# Patient Record
Sex: Female | Born: 1960 | Race: White | Hispanic: No | Smoking: Former smoker
Health system: Southern US, Community
[De-identification: ages and names within clinical notes are randomized; demographics above are authoritative.]

## PROBLEM LIST (undated history)

## (undated) DIAGNOSIS — F419 Anxiety disorder, unspecified: Secondary | ICD-10-CM

## (undated) DIAGNOSIS — C7800 Secondary malignant neoplasm of unspecified lung: Secondary | ICD-10-CM

## (undated) DIAGNOSIS — S82892A Other fracture of left lower leg, initial encounter for closed fracture: Secondary | ICD-10-CM

## (undated) DIAGNOSIS — M419 Scoliosis, unspecified: Secondary | ICD-10-CM

## (undated) DIAGNOSIS — Z923 Personal history of irradiation: Secondary | ICD-10-CM

## (undated) DIAGNOSIS — E669 Obesity, unspecified: Secondary | ICD-10-CM

## (undated) DIAGNOSIS — A6000 Herpesviral infection of urogenital system, unspecified: Secondary | ICD-10-CM

## (undated) DIAGNOSIS — F329 Major depressive disorder, single episode, unspecified: Secondary | ICD-10-CM

## (undated) DIAGNOSIS — T7840XA Allergy, unspecified, initial encounter: Secondary | ICD-10-CM

## (undated) DIAGNOSIS — M810 Age-related osteoporosis without current pathological fracture: Secondary | ICD-10-CM

## (undated) DIAGNOSIS — C801 Malignant (primary) neoplasm, unspecified: Secondary | ICD-10-CM

## (undated) DIAGNOSIS — S82891A Other fracture of right lower leg, initial encounter for closed fracture: Secondary | ICD-10-CM

## (undated) DIAGNOSIS — N393 Stress incontinence (female) (male): Secondary | ICD-10-CM

## (undated) DIAGNOSIS — C7951 Secondary malignant neoplasm of bone: Secondary | ICD-10-CM

## (undated) DIAGNOSIS — Z9221 Personal history of antineoplastic chemotherapy: Secondary | ICD-10-CM

## (undated) DIAGNOSIS — E7800 Pure hypercholesterolemia, unspecified: Secondary | ICD-10-CM

## (undated) DIAGNOSIS — M1990 Unspecified osteoarthritis, unspecified site: Secondary | ICD-10-CM

## (undated) DIAGNOSIS — K219 Gastro-esophageal reflux disease without esophagitis: Secondary | ICD-10-CM

## (undated) DIAGNOSIS — M797 Fibromyalgia: Secondary | ICD-10-CM

## (undated) DIAGNOSIS — C787 Secondary malignant neoplasm of liver and intrahepatic bile duct: Secondary | ICD-10-CM

## (undated) DIAGNOSIS — Z1389 Encounter for screening for other disorder: Secondary | ICD-10-CM

## (undated) DIAGNOSIS — G629 Polyneuropathy, unspecified: Secondary | ICD-10-CM

## (undated) DIAGNOSIS — G709 Myoneural disorder, unspecified: Secondary | ICD-10-CM

## (undated) DIAGNOSIS — C50919 Malignant neoplasm of unspecified site of unspecified female breast: Secondary | ICD-10-CM

## (undated) DIAGNOSIS — S62101A Fracture of unspecified carpal bone, right wrist, initial encounter for closed fracture: Secondary | ICD-10-CM

## (undated) HISTORY — DX: Polyneuropathy, unspecified: G62.9

## (undated) HISTORY — DX: Other fracture of left lower leg, initial encounter for closed fracture: S82.892A

## (undated) HISTORY — PX: WISDOM TOOTH EXTRACTION: SHX21

## (undated) HISTORY — DX: Pure hypercholesterolemia, unspecified: E78.00

## (undated) HISTORY — DX: Major depressive disorder, single episode, unspecified: F32.9

## (undated) HISTORY — DX: Fracture of unspecified carpal bone, right wrist, initial encounter for closed fracture: S62.101A

## (undated) HISTORY — DX: Allergy, unspecified, initial encounter: T78.40XA

## (undated) HISTORY — DX: Other fracture of right lower leg, initial encounter for closed fracture: S82.891A

## (undated) HISTORY — DX: Stress incontinence (female) (male): N39.3

## (undated) HISTORY — DX: Fibromyalgia: M79.7

## (undated) HISTORY — DX: Obesity, unspecified: E66.9

## (undated) HISTORY — DX: Herpesviral infection of urogenital system, unspecified: A60.00

## (undated) HISTORY — DX: Unspecified osteoarthritis, unspecified site: M19.90

## (undated) HISTORY — DX: Scoliosis, unspecified: M41.9

## (undated) MED FILL — Tbo-Filgrastim Soln Prefilled Syringe 300 MCG/0.5ML: SUBCUTANEOUS | Qty: 0.5 | Status: AC

---

## 1998-10-19 ENCOUNTER — Ambulatory Visit (HOSPITAL_COMMUNITY): Admission: RE | Admit: 1998-10-19 | Discharge: 1998-10-19 | Payer: Self-pay | Admitting: Obstetrics and Gynecology

## 1999-08-23 ENCOUNTER — Other Ambulatory Visit: Admission: RE | Admit: 1999-08-23 | Discharge: 1999-08-23 | Payer: Self-pay | Admitting: Obstetrics and Gynecology

## 2000-02-01 ENCOUNTER — Ambulatory Visit (HOSPITAL_COMMUNITY): Admission: RE | Admit: 2000-02-01 | Discharge: 2000-02-01 | Payer: Self-pay | Admitting: Obstetrics and Gynecology

## 2000-02-01 ENCOUNTER — Encounter: Payer: Self-pay | Admitting: Obstetrics and Gynecology

## 2000-08-23 ENCOUNTER — Other Ambulatory Visit: Admission: RE | Admit: 2000-08-23 | Discharge: 2000-08-23 | Payer: Self-pay | Admitting: *Deleted

## 2001-04-25 ENCOUNTER — Ambulatory Visit (HOSPITAL_COMMUNITY): Admission: RE | Admit: 2001-04-25 | Discharge: 2001-04-25 | Payer: Self-pay | Admitting: Obstetrics and Gynecology

## 2001-04-25 ENCOUNTER — Encounter: Payer: Self-pay | Admitting: Obstetrics and Gynecology

## 2002-05-05 ENCOUNTER — Emergency Department (HOSPITAL_COMMUNITY): Admission: EM | Admit: 2002-05-05 | Discharge: 2002-05-05 | Payer: Self-pay

## 2002-05-05 ENCOUNTER — Encounter: Payer: Self-pay | Admitting: Emergency Medicine

## 2002-07-02 ENCOUNTER — Other Ambulatory Visit: Admission: RE | Admit: 2002-07-02 | Discharge: 2002-07-02 | Payer: Self-pay | Admitting: Obstetrics and Gynecology

## 2003-12-05 DIAGNOSIS — A6000 Herpesviral infection of urogenital system, unspecified: Secondary | ICD-10-CM

## 2003-12-05 HISTORY — DX: Herpesviral infection of urogenital system, unspecified: A60.00

## 2005-03-06 ENCOUNTER — Other Ambulatory Visit: Admission: RE | Admit: 2005-03-06 | Discharge: 2005-03-06 | Payer: Self-pay | Admitting: Obstetrics and Gynecology

## 2005-07-11 ENCOUNTER — Ambulatory Visit (HOSPITAL_COMMUNITY): Admission: RE | Admit: 2005-07-11 | Discharge: 2005-07-11 | Payer: Self-pay | Admitting: Family Medicine

## 2005-07-19 ENCOUNTER — Encounter: Admission: RE | Admit: 2005-07-19 | Discharge: 2005-07-19 | Payer: Self-pay | Admitting: Family Medicine

## 2006-01-15 ENCOUNTER — Encounter: Admission: RE | Admit: 2006-01-15 | Discharge: 2006-01-15 | Payer: Self-pay | Admitting: Family Medicine

## 2006-04-04 ENCOUNTER — Encounter: Payer: Self-pay | Admitting: Obstetrics and Gynecology

## 2006-09-20 ENCOUNTER — Encounter: Admission: RE | Admit: 2006-09-20 | Discharge: 2006-09-20 | Payer: Self-pay | Admitting: Family Medicine

## 2007-09-23 ENCOUNTER — Encounter: Admission: RE | Admit: 2007-09-23 | Discharge: 2007-09-23 | Payer: Self-pay | Admitting: Family Medicine

## 2007-10-15 ENCOUNTER — Other Ambulatory Visit: Admission: RE | Admit: 2007-10-15 | Discharge: 2007-10-15 | Payer: Self-pay | Admitting: Family Medicine

## 2008-08-18 ENCOUNTER — Encounter: Admission: RE | Admit: 2008-08-18 | Discharge: 2008-08-18 | Payer: Self-pay | Admitting: Family Medicine

## 2008-09-25 ENCOUNTER — Encounter: Admission: RE | Admit: 2008-09-25 | Discharge: 2008-09-25 | Payer: Self-pay | Admitting: Family Medicine

## 2009-09-27 ENCOUNTER — Encounter: Admission: RE | Admit: 2009-09-27 | Discharge: 2009-09-27 | Payer: Self-pay | Admitting: Family Medicine

## 2010-01-25 ENCOUNTER — Encounter: Admission: RE | Admit: 2010-01-25 | Discharge: 2010-01-25 | Payer: Self-pay | Admitting: Family Medicine

## 2010-09-29 ENCOUNTER — Encounter: Admission: RE | Admit: 2010-09-29 | Discharge: 2010-09-29 | Payer: Self-pay | Admitting: Physician Assistant

## 2010-12-04 DIAGNOSIS — Z1389 Encounter for screening for other disorder: Secondary | ICD-10-CM

## 2010-12-04 HISTORY — DX: Encounter for screening for other disorder: Z13.89

## 2011-10-10 ENCOUNTER — Other Ambulatory Visit: Payer: Self-pay | Admitting: Physician Assistant

## 2011-10-10 DIAGNOSIS — Z1231 Encounter for screening mammogram for malignant neoplasm of breast: Secondary | ICD-10-CM

## 2011-11-03 ENCOUNTER — Ambulatory Visit: Payer: Self-pay

## 2011-12-05 DIAGNOSIS — F32A Depression, unspecified: Secondary | ICD-10-CM

## 2011-12-05 DIAGNOSIS — K219 Gastro-esophageal reflux disease without esophagitis: Secondary | ICD-10-CM

## 2011-12-05 DIAGNOSIS — F419 Anxiety disorder, unspecified: Secondary | ICD-10-CM

## 2011-12-05 DIAGNOSIS — M797 Fibromyalgia: Secondary | ICD-10-CM

## 2011-12-05 HISTORY — DX: Anxiety disorder, unspecified: F41.9

## 2011-12-05 HISTORY — DX: Depression, unspecified: F32.A

## 2011-12-05 HISTORY — DX: Gastro-esophageal reflux disease without esophagitis: K21.9

## 2011-12-05 HISTORY — DX: Fibromyalgia: M79.7

## 2011-12-13 ENCOUNTER — Ambulatory Visit (INDEPENDENT_AMBULATORY_CARE_PROVIDER_SITE_OTHER): Payer: Managed Care, Other (non HMO) | Admitting: Physician Assistant

## 2011-12-13 DIAGNOSIS — F341 Dysthymic disorder: Secondary | ICD-10-CM

## 2011-12-13 DIAGNOSIS — R634 Abnormal weight loss: Secondary | ICD-10-CM

## 2012-01-04 ENCOUNTER — Ambulatory Visit: Payer: Managed Care, Other (non HMO)

## 2012-01-04 ENCOUNTER — Ambulatory Visit (INDEPENDENT_AMBULATORY_CARE_PROVIDER_SITE_OTHER): Payer: Managed Care, Other (non HMO) | Admitting: Emergency Medicine

## 2012-01-04 DIAGNOSIS — R05 Cough: Secondary | ICD-10-CM

## 2012-01-04 DIAGNOSIS — F329 Major depressive disorder, single episode, unspecified: Secondary | ICD-10-CM | POA: Insufficient documentation

## 2012-01-04 DIAGNOSIS — R509 Fever, unspecified: Secondary | ICD-10-CM

## 2012-01-04 DIAGNOSIS — R059 Cough, unspecified: Secondary | ICD-10-CM

## 2012-01-04 DIAGNOSIS — F32A Depression, unspecified: Secondary | ICD-10-CM | POA: Insufficient documentation

## 2012-01-04 DIAGNOSIS — N951 Menopausal and female climacteric states: Secondary | ICD-10-CM | POA: Insufficient documentation

## 2012-01-04 DIAGNOSIS — M419 Scoliosis, unspecified: Secondary | ICD-10-CM

## 2012-01-04 DIAGNOSIS — F3289 Other specified depressive episodes: Secondary | ICD-10-CM

## 2012-01-04 DIAGNOSIS — R519 Headache, unspecified: Secondary | ICD-10-CM | POA: Insufficient documentation

## 2012-01-04 HISTORY — DX: Scoliosis, unspecified: M41.9

## 2012-01-04 LAB — POCT CBC
HCT, POC: 44.4 % (ref 37.7–47.9)
Hemoglobin: 14.3 g/dL (ref 12.2–16.2)
Lymph, poc: 1.4 (ref 0.6–3.4)
MID (cbc): 0.2 (ref 0–0.9)
POC Granulocyte: 3.3 (ref 2–6.9)
POC MID %: 4.8 %M (ref 0–12)

## 2012-01-04 MED ORDER — OSELTAMIVIR PHOSPHATE 75 MG PO CAPS: 75.0000 mg | ORAL_CAPSULE | Freq: Two times a day (BID) | ORAL | Status: AC

## 2012-01-04 NOTE — Progress Notes (Signed)
  Subjective:    Patient ID: April [name], female    DOB: 1961-10-04, 51 y.o.   MRN: [id number]  HPI  Patient is a 51 year old female who comes in complaining of flue like symptoms, sore throat, cold, cough, and severe myalgias.  She has also been to the doctor this morning for an evaluation of a previous root canal, and was placed on amoxicillin.     Review of Systems  Denies fever or productive cough     Objective:   Physical Exam  Constitutional:       Appears weak and fatigued  Eyes: Pupils are equal, round, and reactive to light.  Neck: No thyromegaly present.  Cardiovascular: Normal rate, normal heart sounds and intact distal pulses.   Pulmonary/Chest: No respiratory distress. She has no wheezes. She has no rales. She exhibits no tenderness.  Abdominal: She exhibits no distension.          Assessment & Plan:  Flu like symptoms. Will check flu swab cbc and cxr

## 2012-01-04 NOTE — Progress Notes (Signed)
  Subjective:    Patient ID: April [name], female    DOB: May 07, 1961, 51 y.o.   MRN: [id number]  HPI    Review of Systems     Objective:   Physical Exam        Assessment & Plan:  [facility] reading (PRIMARY) by  Dr. Cleta Alberts.NAD scoliosis

## 2012-01-17 ENCOUNTER — Ambulatory Visit (INDEPENDENT_AMBULATORY_CARE_PROVIDER_SITE_OTHER): Payer: Managed Care, Other (non HMO) | Admitting: Emergency Medicine

## 2012-01-17 VITALS — BP 119/76 | HR 69 | Temp 98.6°F | Resp 16 | Ht 63.75 in | Wt 216.0 lb

## 2012-01-17 DIAGNOSIS — IMO0001 Reserved for inherently not codable concepts without codable children: Secondary | ICD-10-CM

## 2012-01-17 DIAGNOSIS — R5383 Other fatigue: Secondary | ICD-10-CM

## 2012-01-17 DIAGNOSIS — R5381 Other malaise: Secondary | ICD-10-CM

## 2012-01-17 DIAGNOSIS — R110 Nausea: Secondary | ICD-10-CM

## 2012-01-17 LAB — POCT CBC
Granulocyte percent: 64.5 %G (ref 37–80)
Hemoglobin: 12.5 g/dL (ref 12.2–16.2)
MCH, POC: 30.6 pg (ref 27–31.2)
MID (cbc): 0.4 (ref 0–0.9)
POC Granulocyte: 3.3 (ref 2–6.9)
POC LYMPH PERCENT: 28.6 %L (ref 10–50)
POC MID %: 6.9 %M (ref 0–12)
RBC: 4.09 M/uL (ref 4.04–5.48)

## 2012-01-17 LAB — GLUCOSE, POCT (MANUAL RESULT ENTRY): POC Glucose: 81

## 2012-01-17 MED ORDER — RANITIDINE HCL 150 MG PO TABS: 150.0000 mg | ORAL_TABLET | Freq: Two times a day (BID) | ORAL | Status: DC

## 2012-01-17 NOTE — Patient Instructions (Signed)
While we wait on the remaining lab results, try to get rest and drink plenty of fluids.  I've prescribed a medication to help your reflux, Ranitidine.  Over time, treatment of your reflux should help your fatigue.  If all your tests are normal, we'll consider increasing your Celexa dose, as stress and depression can cause/worsen these symptoms as well.

## 2012-01-17 NOTE — Progress Notes (Signed)
  Subjective:    Patient ID: April [name], female    DOB: 12-25-1960, 51 y.o.   MRN: [id number]  HPI This patient presents with persistent fatigue myalgias and nausea. She was seen 2 weeks ago with similar symptoms. At that time it was thought she may have had influenza despite her negative test. She had just had a root canal and so completed her course of amoxicillin, no improvement. She describes headache neck pain and hurting all over. No energy. No constipation. No diarrhea. No known or hematochezia. No urinary symptoms. Nausea is routine for her, "I have refluxed every day." Medications-she started the Celexa approximately 8 weeks ago.  She has long-standing depression. Most recently on Effexor. Switched to Celexa. Her daughter has very difficult to control bipolar disorder. Recently having crisis.   Review of Systems As above. No sore throat. Mild cough. No nasal congestion or drainage. No rash.    Objective:   Physical Exam Vital signs are noted. This is a well-developed well-nourished, obese white female who is awake, alert and oriented in no acute distress. Nasal passages are clear. Ear canals are clear and TMs are normal bilaterally. Oropharynx is remarkable for some mild erythema on the left.  Neck is supple and nontender without thyromegaly or lymphadenopathy. Heart and lungs are clear. Peripheral pulses are symmetrically strong and skin is warm and dry.  Labs from her last visit are reviewed. Rapid strep was negative. CBC was normal. Chest x-ray also normal.  Results for orders placed in visit on 01/17/12  POCT CBC      Component Value Range   WBC 5.1  4.6 - 10.2 (K/uL)   Lymph, poc 1.5  0.6 - 3.4    POC LYMPH PERCENT 28.6  10 - 50 (%L)   MID (cbc) 0.4  0 - 0.9    POC MID % 6.9  0 - 12 (%M)   POC Granulocyte 3.3  2 - 6.9    Granulocyte percent 64.5  37 - 80 (%G)   RBC 4.09  4.04 - 5.48 (M/uL)   Hemoglobin 12.5  12.2 - 16.2 (g/dL)   HCT, POC 40.9  81.1 - 47.9 (%)   MCV  94.1  80 - 97 (fL)   MCH, POC 30.6  27 - 31.2 (pg)   MCHC 32.5  31.8 - 35.4 (g/dL)   RDW, POC 91.4     Platelet Count, POC 273  142 - 424 (K/uL)   MPV 9.0  0 - 99.8 (fL)  GLUCOSE, POCT (MANUAL RESULT ENTRY)      Component Value Range   POC Glucose 81    POCT SEDIMENTATION RATE      Component Value Range   POCT SED RATE 26 (*) 0 - 22 (mm/hr)   CMET, TSH and EBV titers pending.     Assessment & Plan:  1. Fatigue. 2. Nausea.  Await remaining lab results.  I suspect her symptoms are related to resolving a recent viral infection, possibly influenza, acute situational stress secondary to her daughter's mental health issues at present that are ongoing, and inadequately treated reflux. Ranitidine 150 mg twice a day.  If lab results still outstanding all return normal I recommend increasing her Celexa dose. She has a scheduled appointment with her primary care provider, Benny Lennert, PA-C, in 3 weeks.  Discussed with Dr. Cleta Alberts and Ms. Weber.

## 2012-01-19 ENCOUNTER — Telehealth: Payer: Self-pay

## 2012-01-19 LAB — COMPREHENSIVE METABOLIC PANEL
AST: 18 U/L (ref 0–37)
Albumin: 3.7 g/dL (ref 3.5–5.2)
BUN: 11 mg/dL (ref 6–23)
CO2: 25 mEq/L (ref 19–32)
Calcium: 8.6 mg/dL (ref 8.4–10.5)
Chloride: 106 mEq/L (ref 96–112)
Glucose, Bld: 91 mg/dL (ref 70–99)
Sodium: 138 mEq/L (ref 135–145)

## 2012-01-19 LAB — EPSTEIN-BARR VIRUS VCA ANTIBODY PANEL: EBV EA IgG: 0.04 {ISR}

## 2012-01-19 LAB — TSH: TSH: 2.288 u[IU]/mL (ref 0.350–4.500)

## 2012-01-19 NOTE — Telephone Encounter (Signed)
Pt states that she is considerably worse and she can barely move, feels as if she has the flu. Please Advise.

## 2012-01-20 NOTE — Telephone Encounter (Signed)
Dr. Cleta Alberts,  See phone note. Would you like to increase Celexa as your note says, or RTC?  RD

## 2012-01-20 NOTE — Telephone Encounter (Signed)
LMOM to CB. 

## 2012-01-20 NOTE — Telephone Encounter (Signed)
Spoke with pt she is having severe aches and pains, stomcah pain, chest and upper back still hurting. She doesn't think these are sxs of depression. She would like to know what to do next. Please advise

## 2012-01-21 NOTE — Telephone Encounter (Signed)
Spoke with pt and advised to come in for recheck. Pt states she will come in to see Benny Lennert on Monday.

## 2012-01-21 NOTE — Telephone Encounter (Signed)
Please call patient and tell her the symptoms she is describing are not related to depression. I would advise she return to clinic for reevaluation.

## 2012-01-21 NOTE — Telephone Encounter (Signed)
LMOM to CB. 

## 2012-01-22 ENCOUNTER — Ambulatory Visit (INDEPENDENT_AMBULATORY_CARE_PROVIDER_SITE_OTHER): Payer: Managed Care, Other (non HMO) | Admitting: Family Medicine

## 2012-01-22 DIAGNOSIS — R635 Abnormal weight gain: Secondary | ICD-10-CM

## 2012-01-22 DIAGNOSIS — R52 Pain, unspecified: Secondary | ICD-10-CM

## 2012-01-22 DIAGNOSIS — R5381 Other malaise: Secondary | ICD-10-CM

## 2012-01-22 DIAGNOSIS — IMO0001 Reserved for inherently not codable concepts without codable children: Secondary | ICD-10-CM

## 2012-01-22 LAB — POCT URINALYSIS DIPSTICK
Blood, UA: NEGATIVE
Glucose, UA: NEGATIVE
Nitrite, UA: NEGATIVE
pH, UA: 6.5

## 2012-01-22 LAB — POCT UA - MICROSCOPIC ONLY
Epithelial cells, urine per micros: NEGATIVE
WBC, Ur, HPF, POC: NEGATIVE

## 2012-01-22 LAB — HEPATITIS B SURFACE ANTIBODY, QUANTITATIVE: Hepatitis B-Post: 0.2 m[IU]/mL

## 2012-01-22 LAB — RPR

## 2012-01-22 NOTE — Progress Notes (Signed)
Patient Name: April [name] Date of Birth: 1961/07/30 Medical Record Number: [id number] Gender: female Date of Encounter: 01/22/2012  History of Present Illness:  April [name] is a 51 y.o. very pleasant female patient who presents with the following:  I've had all sort of labs done and I'm still feeling the same way.  I hurt all over, I can barely walk.  Now my stomach is hurting."  "Ilm totally exhausted."   Stomach feels "like it's burning" despite being on Tagament 2x/ daily.  Sometimes hurts "right through to my back." Was here originally for this problem on 01/04/12, then returned on 01/19/12.  She notes that flu- like symptoms have not gone away, "I can't stand for very long at all without just hurting all over.  Like washing dishes, you can just forget it.  It's hard to hold on to something for a period of time (such as a coffee cup)."   Has noticed that he feel have been a little swollen, cannot be sure if her joints have swollen. No Fhx of any autoimmune problem.    Actually has had problems with "hurting" off an on for years.  Wonders if she has FBM.   No fever.  Sx are worse when she first rises- "my whole body hurts"  "It hurts to sleep."  Does have some element of plantar fascitis but everything else is worst in the am also.  Also notes that her hips feel numb when she sleeps.    Her weight had been around 150lbs through most of her adult life- went up to around 200 a year ago.    Has changed from Effexor to celexa about 6 weeks ago.  This has helped with nausea.  Feels that her mood is ok- "I'm not depressed."  Normal sleep habits.    LMP was 05/2011.  Perimenopausal.    Recent OV for fatigue- A and P that visit: 1. Fatigue.  2. Nausea.  Await remaining lab results. I suspect her symptoms are related to resolving a recent viral infection, possibly influenza, acute situational stress secondary to her daughter's mental health issues at present that are ongoing, and inadequately  treated reflux.  Ranitidine 150 mg twice a day.  If lab results still outstanding all return normal I recommend increasing her Celexa dose. She has a scheduled appointment with her primary care provider, Benny Lennert, PA-C, in 3 weeks.  Patient Active Problem List  Diagnoses  . Perimenopausal  . Depression (emotion)   Past Medical History  Diagnosis Date  . Obesity   . Migraine    History reviewed. No pertinent past surgical history. History  Substance Use Topics  . Smoking status: Former Smoker    Quit date: 01/21/1994  . Smokeless tobacco: Never Used  . Alcohol Use: No   Family History  Problem Relation Age of Onset  . Alcohol abuse Mother   . Arthritis Mother   . Hypertension Mother   . Heart disease Mother    Allergies  Allergen Reactions  . Hydrocodone Nausea Only and Other (See Comments)    dizziness    Medication list has been reviewed and updated.  Review of Systems: As per HPI  Physical Examination: Filed Vitals:   01/22/12 0925  BP: 132/85  Pulse: 69  Temp: 99.2 F (37.3 C)  TempSrc: Oral  Resp: 16  Height: 5' 3.75" (1.619 m)  Weight: 217 lb 12.8 oz (98.793 kg)    Body mass index is 37.68 kg/(m^2).  GEN: WDWN,  NAD, Non-toxic, A & O x 3, obese HEENT: Atraumatic, Normocephalic. Neck supple. No masses, No LAD. Ears and Nose: No external deformity. CV: RRR, No M/G/R. No JVD. No thrill. No extra heart sounds. PULM: CTA B, no wheezes, crackles, rhonchi. No retractions. No resp. distress. No accessory muscle use. ABD: S, NT, ND, +BS. No rebound. No HSM. EXTR: No c/c/e.  No gross joint abnormalities such as nodules, swelling or heat NEURO Normal gait.  PSYCH: Normally interactive. Conversant. Not depressed or anxious appearing.  Calm demeanor.   Results for orders placed in visit on 01/22/12  POCT UA - MICROSCOPIC ONLY      Component Value Range   WBC, Ur, HPF, POC neg     RBC, urine, microscopic neg     Bacteria, U Microscopic neg     Mucus, UA  neg     Epithelial cells, urine per micros neg     Crystals, Ur, HPF, POC neg     Casts, Ur, LPF, POC neg     Yeast, UA neg    POCT URINALYSIS DIPSTICK      Component Value Range   Color, UA yellow     Clarity, UA clear     Glucose, UA neg     Bilirubin, UA neg     Ketones, UA neg     Spec Grav, UA 1.015     Blood, UA neg     pH, UA 6.5     Protein, UA neg     Urobilinogen, UA 0.2     Nitrite, UA neg     Leukocytes, UA Negative    POCT URINE PREGNANCY      Component Value Range   Preg Test, Ur Negative     Assessment and Plan: 1. Malaise and fatigue  HIV Antibody, RPR, Hepatitis C antibody, Hepatitis B surface antigen, Hepatitis B surface antibody, POCT UA - Microscopic Only, POCT urinalysis dipstick, Anti-DNA antibody, double-stranded, ANA  2. Body aches    3. Weight gain  POCT urine pregnancy   Will do labs as above in attempt to find reason for pts symptoms- consider auto- immune etiology.  Let us know if she gets worse- otherwise plan follow- up with labs

## 2012-01-23 ENCOUNTER — Encounter: Payer: Self-pay | Admitting: *Deleted

## 2012-01-23 LAB — ANA: Anti Nuclear Antibody(ANA): NEGATIVE

## 2012-01-24 ENCOUNTER — Encounter: Payer: Self-pay | Admitting: Family Medicine

## 2012-01-26 ENCOUNTER — Telehealth: Payer: Self-pay | Admitting: Family Medicine

## 2012-01-28 ENCOUNTER — Telehealth: Payer: Self-pay | Admitting: Family Medicine

## 2012-01-28 NOTE — Telephone Encounter (Signed)
Opened in error

## 2012-01-29 ENCOUNTER — Telehealth: Payer: Self-pay

## 2012-01-29 NOTE — Telephone Encounter (Signed)
Pt CB to get lab results. Asked Dr Patsy Lager who stated labs looked OK but she will call pt back later to explain in more detail. Let pt know this and she said best number to CB is work number which is pt's personal number [phone]

## 2012-01-29 NOTE — Telephone Encounter (Signed)
Called to discuss with her.  She continues to have pain all over, but even more so in her lower back at this point.  Went over all labs.  She would like to proceed with a referral to possibly diagnose FBM and rule- out other causes of prolonged pain. I will do this for me and set up an evaluation with Dr. Corliss Skains

## 2012-01-29 NOTE — Telephone Encounter (Signed)
Nothing to enter

## 2012-02-06 ENCOUNTER — Other Ambulatory Visit: Payer: Self-pay | Admitting: Physician Assistant

## 2012-02-07 ENCOUNTER — Encounter: Payer: Self-pay | Admitting: Physician Assistant

## 2012-02-07 ENCOUNTER — Ambulatory Visit (INDEPENDENT_AMBULATORY_CARE_PROVIDER_SITE_OTHER): Payer: Managed Care, Other (non HMO) | Admitting: Physician Assistant

## 2012-02-07 VITALS — BP 119/84 | HR 57 | Temp 97.0°F | Resp 20 | Ht 64.0 in | Wt 214.6 lb

## 2012-02-07 DIAGNOSIS — IMO0001 Reserved for inherently not codable concepts without codable children: Secondary | ICD-10-CM

## 2012-02-07 DIAGNOSIS — M791 Myalgia, unspecified site: Secondary | ICD-10-CM

## 2012-02-07 DIAGNOSIS — F419 Anxiety disorder, unspecified: Secondary | ICD-10-CM

## 2012-02-07 DIAGNOSIS — F411 Generalized anxiety disorder: Secondary | ICD-10-CM

## 2012-02-07 MED ORDER — CYCLOBENZAPRINE HCL 5 MG PO TABS: 5.0000 mg | ORAL_TABLET | Freq: Three times a day (TID) | ORAL | Status: DC | PRN

## 2012-02-07 MED ORDER — CITALOPRAM HYDROBROMIDE 20 MG PO TABS: 20.0000 mg | ORAL_TABLET | Freq: Every day | ORAL | Status: DC

## 2012-02-07 NOTE — Progress Notes (Signed)
  Subjective:    Patient ID: April [name], female    DOB: 03-24-1961, 51 y.o.   MRN: [id number]  HPI  Pt presents to clinic for f/u of Celexa and anxiety.  Her nausea has completely resolved after d/c of Effexor.  The Celexa seems to be helping with depression/anxiety.  She has an appt with Dr. Corliss Skains on 4/4 for possible fibromyalgia dx.  She has constant pain in muscles of arms, legs and back.  Pain is worse at times than others - causes her insomnia because she aches.  Has burning pain in muscles that ache during the day.  Still trying to lose weight but is frustrated because hurts to bad to work out.  Review of Systems  Musculoskeletal: Positive for myalgias. Negative for arthralgias.  Psychiatric/Behavioral: Positive for sleep disturbance. The patient is nervous/anxious.        Objective:   Physical Exam  Constitutional: She is oriented to person, place, and time. She appears well-developed and well-nourished.  HENT:  Head: Normocephalic and atraumatic.  Right Ear: External ear normal.  Left Ear: External ear normal.  Eyes: Conjunctivae are normal.  Pulmonary/Chest: Effort normal.  Neurological: She is alert and oriented to person, place, and time.  Skin: Skin is warm and dry.  Psychiatric: She has a normal mood and affect. Her behavior is normal. Judgment and thought content normal.          Assessment & Plan:   1. Anxiety   2. Myalgia    Will continue with celexa 20mg  qd.  Will trial Flexeril 5mg  1-2 tid for muscle pain and aching.  Expect pt to get relief if she can tolerate meds and ? Need for neurontin for burning pain but will wait for rheumatologist eval.  D/w pt to start with Flexeril at night 5mg  and titrate to improve symptoms or side effect intolerability.  Pt understands and agrees.

## 2012-03-15 ENCOUNTER — Encounter: Payer: Self-pay | Admitting: Family Medicine

## 2012-03-15 DIAGNOSIS — M797 Fibromyalgia: Secondary | ICD-10-CM | POA: Insufficient documentation

## 2012-03-28 ENCOUNTER — Other Ambulatory Visit: Payer: Self-pay | Admitting: Physician Assistant

## 2012-07-02 ENCOUNTER — Ambulatory Visit (INDEPENDENT_AMBULATORY_CARE_PROVIDER_SITE_OTHER): Payer: Managed Care, Other (non HMO) | Admitting: Family Medicine

## 2012-07-02 VITALS — BP 126/76 | HR 84 | Temp 97.8°F | Resp 16 | Ht 64.0 in | Wt 217.0 lb

## 2012-07-02 DIAGNOSIS — B373 Candidiasis of vulva and vagina: Secondary | ICD-10-CM

## 2012-07-02 DIAGNOSIS — N898 Other specified noninflammatory disorders of vagina: Secondary | ICD-10-CM

## 2012-07-02 DIAGNOSIS — Z113 Encounter for screening for infections with a predominantly sexual mode of transmission: Secondary | ICD-10-CM

## 2012-07-02 DIAGNOSIS — B3731 Acute candidiasis of vulva and vagina: Secondary | ICD-10-CM

## 2012-07-02 LAB — POCT URINALYSIS DIPSTICK
Bilirubin, UA: NEGATIVE
Blood, UA: NEGATIVE
Glucose, UA: NEGATIVE
Ketones, UA: NEGATIVE
Leukocytes, UA: NEGATIVE
Nitrite, UA: NEGATIVE
Protein, UA: NEGATIVE
Spec Grav, UA: 1.02
Urobilinogen, UA: 0.2
pH, UA: 7

## 2012-07-02 LAB — POCT UA - MICROSCOPIC ONLY
Bacteria, U Microscopic: NEGATIVE
Casts, Ur, LPF, POC: NEGATIVE
Crystals, Ur, HPF, POC: NEGATIVE
Yeast, UA: NEGATIVE

## 2012-07-02 LAB — POCT WET PREP WITH KOH
KOH Prep POC: POSITIVE
Trichomonas, UA: NEGATIVE
Yeast Wet Prep HPF POC: NEGATIVE

## 2012-07-02 MED ORDER — FLUCONAZOLE 150 MG PO TABS: 150.0000 mg | ORAL_TABLET | Freq: Once | ORAL | Status: AC

## 2012-07-02 NOTE — Progress Notes (Signed)
 [facility]:  Office Visit  Chief Complaint:  Chief Complaint  Patient presents with  . Exposure to STD    pt believes she has chlamydia    HPI: April [name] is a 51 y.o. female who complains of unprotected sex on June 15, 2012. Vaginal DC. Denies fevers, chills,+ pelvic pain. Tried yeast treatment without relief.   Past Medical History  Diagnosis Date  . Obesity   . Migraine   . Fibromyalgia 2013    diagnosed by Dr. Corliss Skains   No past surgical history on file. History   Social History  . Marital Status: Single    Spouse Name: N/A    Number of Children: N/A  . Years of Education: N/A   Social History Main Topics  . Smoking status: Former Smoker    Quit date: 01/21/1994  . Smokeless tobacco: Never Used  . Alcohol Use: No  . Drug Use: No  . Sexually Active: None   Other Topics Concern  . None   Social History Narrative  . None   Family History  Problem Relation Age of Onset  . Alcohol abuse Mother   . Arthritis Mother   . Hypertension Mother   . Heart disease Mother    Allergies  Allergen Reactions  . Hydrocodone Nausea Only and Other (See Comments)    dizziness   Prior to Admission medications   Medication Sig Start Date End Date Taking? Authorizing Provider  Cetirizine HCl (ZYRTEC PO) Take 10 mg by mouth.    Yes Historical Provider, MD  DULoxetine (CYMBALTA) 60 MG capsule Take 60 mg by mouth daily.   Yes Historical Provider, MD  magnesium gluconate (MAGONATE) 500 MG tablet Take 500 mg by mouth 2 (two) times daily.   Yes Historical Provider, MD  Multiple Vitamin (MULTIVITAMIN) capsule Take 1 capsule by mouth daily.   Yes Historical Provider, MD  citalopram (CELEXA) 20 MG tablet Take 1 tablet (20 mg total) by mouth daily. 02/07/12   Morrell Riddle, PA-C  ranitidine (ZANTAC) 150 MG tablet Take 1 tablet (150 mg total) by mouth 2 (two) times daily. 01/17/12 01/16/13  Chelle S Jeffery, PA-C     ROS: The patient denies fevers, chills, night  sweats, unintentional weight loss, chest pain, palpitations, wheezing, dyspnea on exertion, nausea, vomiting, abdominal pain, dysuria, hematuria, melena, numbness, weakness, or tingling.   All other systems have been reviewed and were otherwise negative with the exception of those mentioned in the HPI and as above.    PHYSICAL EXAM: Filed Vitals:   07/02/12 1613  BP: 126/76  Pulse: 84  Temp: 97.8 F (36.6 C)  Resp: 16   Filed Vitals:   07/02/12 1613  Height: 5\' 4"  (1.626 m)  Weight: 217 lb (98.431 kg)   Body mass index is 37.25 kg/(m^2).  General: Alert, no acute distress HEENT:  Normocephalic, atraumatic, oropharynx patent.  Cardiovascular:  Regular rate and rhythm, no rubs murmurs or gallops.  No Carotid bruits, radial pulse intact. No pedal edema.  Respiratory: Clear to auscultation bilaterally.  No wheezes, rales, or rhonchi.  No cyanosis, no use of accessory musculature GI: No organomegaly, abdomen is soft and non-tender, positive bowel sounds.  No masses. Skin: No rashes. Neurologic: Facial musculature symmetric. Psychiatric: Patient is appropriate throughout our interaction. Lymphatic: No cervical lymphadenopathy Musculoskeletal: Gait intact. GU-no lesions/masses, minimal white dc, no CMT   LABS: Results for orders placed in visit on 07/02/12  POCT URINALYSIS DIPSTICK  Component Value Range   Color, UA yellow     Clarity, UA clear     Glucose, UA negative     Bilirubin, UA negative     Ketones, UA negative     Spec Grav, UA 1.020     Blood, UA negative     pH, UA 7.0     Protein, UA negative     Urobilinogen, UA 0.2     Nitrite, UA negative     Leukocytes, UA Negative    POCT UA - MICROSCOPIC ONLY      Component Value Range   WBC, Ur, HPF, POC 0-2     RBC, urine, microscopic 0-1     Bacteria, U Microscopic negative     Mucus, UA trace     Epithelial cells, urine per micros 0-1     Crystals, Ur, HPF, POC negative     Casts, Ur, LPF, POC negative      Yeast, UA negative    POCT WET PREP WITH KOH      Component Value Range   Trichomonas, UA Negative     Clue Cells Wet Prep HPF POC 1-2     Epithelial Wet Prep HPF POC 1-6     Yeast Wet Prep HPF POC negative     Bacteria Wet Prep HPF POC 2+     RBC Wet Prep HPF POC 0-1     WBC Wet Prep HPF POC 2-5     KOH Prep POC Positive       EKG/XRAY:   Primary read interpreted by Dr. Conley Rolls at [facility].   ASSESSMENT/PLAN: Encounter Diagnoses  Name Primary?  . Screening for STD (sexually transmitted disease) Yes  . Vaginal Discharge    Cadidiasis-Rx Diflucan STD labs pending    ,  PHUONG, DO 07/02/2012 5:47 PM

## 2012-07-03 LAB — HSV(HERPES SIMPLEX VRS) I + II AB-IGG
HSV 1 Glycoprotein G Ab, IgG: <0.1 IV
HSV 2 Glycoprotein G Ab, IgG: 15.46 IV — ABNORMAL HIGH

## 2012-07-03 LAB — RPR

## 2012-07-03 LAB — HIV ANTIBODY (ROUTINE TESTING W REFLEX): HIV: NONREACTIVE

## 2012-07-03 LAB — GC/CHLAMYDIA PROBE AMP, URINE
Chlamydia, Swab/Urine, PCR: NEGATIVE
GC Probe Amp, Urine: NEGATIVE

## 2012-07-04 ENCOUNTER — Telehealth: Payer: Self-pay | Admitting: Family Medicine

## 2012-07-04 NOTE — Telephone Encounter (Signed)
D/w pt STD results

## 2012-07-28 ENCOUNTER — Ambulatory Visit (INDEPENDENT_AMBULATORY_CARE_PROVIDER_SITE_OTHER): Payer: Managed Care, Other (non HMO) | Admitting: Emergency Medicine

## 2012-07-28 VITALS — BP 144/89 | HR 76 | Temp 98.1°F | Resp 16 | Ht 63.75 in | Wt 218.4 lb

## 2012-07-28 DIAGNOSIS — J309 Allergic rhinitis, unspecified: Secondary | ICD-10-CM

## 2012-07-28 DIAGNOSIS — J3089 Other allergic rhinitis: Secondary | ICD-10-CM | POA: Insufficient documentation

## 2012-07-28 DIAGNOSIS — T7840XA Allergy, unspecified, initial encounter: Secondary | ICD-10-CM

## 2012-07-28 DIAGNOSIS — J029 Acute pharyngitis, unspecified: Secondary | ICD-10-CM

## 2012-07-28 HISTORY — DX: Allergy, unspecified, initial encounter: T78.40XA

## 2012-07-28 LAB — POCT RAPID STREP A (OFFICE): Rapid Strep A Screen: NEGATIVE

## 2012-07-28 MED ORDER — MOMETASONE FUROATE 50 MCG/ACT NA SUSP: 2.0000 | Freq: Every day | NASAL | Status: DC

## 2012-07-28 NOTE — Progress Notes (Signed)
   Date:  07/28/2012   Name:  April [name]   DOB:  [birth year]   MRN:  [id number] Gender: female Age: 51 y.o.  PCP:  Benny Lennert, PA-C    Chief Complaint: Sore Throat and Cough   History of Present Illness:  April [name] is a 51 y.o. pleasant patient who presents with the following:  History of severe allergies with SAR.  Has chronic nasal congestion and post nasal drainage and clear nasal discharge.  Headache for past two weeks mostly frontal in nature.  No cough, fever or chills.  Today has sore throat with "pus in tonsils".  Says she has ulcers on the back of her throat.   Patient Active Problem List  Diagnosis  . Perimenopausal  . Depression (emotion)  . Anxiety  . Fibromyalgia    Past Medical History  Diagnosis Date  . Obesity   . Migraine   . Fibromyalgia 2013    diagnosed by Dr. Corliss Skains    No past surgical history on file.  History  Substance Use Topics  . Smoking status: Former Smoker    Quit date: 01/21/1994  . Smokeless tobacco: Never Used  . Alcohol Use: No    Family History  Problem Relation Age of Onset  . Alcohol abuse Mother   . Arthritis Mother   . Hypertension Mother   . Heart disease Mother     Allergies  Allergen Reactions  . Hydrocodone Nausea Only and Other (See Comments)    dizziness    Medication list has been reviewed and updated.  Current Outpatient Prescriptions on File Prior to Visit  Medication Sig Dispense Refill  . Cetirizine HCl (ZYRTEC PO) Take 10 mg by mouth.       . citalopram (CELEXA) 20 MG tablet Take 1 tablet (20 mg total) by mouth daily.  30 tablet  5  . DULoxetine (CYMBALTA) 60 MG capsule Take 60 mg by mouth daily.      . magnesium gluconate (MAGONATE) 500 MG tablet Take 500 mg by mouth 2 (two) times daily.      . Multiple Vitamin (MULTIVITAMIN) capsule Take 1 capsule by mouth daily.      . ranitidine (ZANTAC) 150 MG tablet Take 1 tablet (150 mg total) by mouth 2 (two) times daily.  60 tablet  1    Review  of Systems:  As per HPI, otherwise negative.    Physical Examination: Filed Vitals:   07/28/12 1035  BP: 144/89  Pulse: 76  Temp: 98.1 F (36.7 C)  Resp: 16   Filed Vitals:   07/28/12 1035  Height: 5' 3.75" (1.619 m)  Weight: 218 lb 6.4 oz (99.066 kg)   Body mass index is 37.78 kg/(m^2). Ideal Body Weight: Weight in (lb) to have BMI = 25: 144.2    GEN: WDWN, NAD, Non-toxic, Alert & Oriented x 3 HEENT: Atraumatic, Normocephalic.   Oropharynx red and injected.  Aphthous ulcers posterior pharynx.   Ears and Nose: No external deformity.  Nasal congestion and erythema with clear nasal drainage EXTR: No clubbing/cyanosis/edema NEURO: Normal gait.  PSYCH: Normally interactive. Conversant. Not depressed or anxious appearing.  Calm demeanor.  CHEST:  CTA BS=  Assessment and Plan: Aphthous stomatitis Seasonal allergic rhinitis Allegra Pharyngitis   Carmelina Dane, MD   Results for orders placed in visit on 07/28/12  POCT RAPID STREP A (OFFICE)      Component Value Range   Rapid Strep A Screen Negative  Negative

## 2012-09-25 ENCOUNTER — Encounter: Payer: Self-pay | Admitting: Physician Assistant

## 2012-09-25 DIAGNOSIS — M797 Fibromyalgia: Secondary | ICD-10-CM

## 2012-11-15 ENCOUNTER — Ambulatory Visit (INDEPENDENT_AMBULATORY_CARE_PROVIDER_SITE_OTHER): Payer: Managed Care, Other (non HMO) | Admitting: Physician Assistant

## 2012-11-15 VITALS — BP 118/79 | HR 92 | Temp 98.6°F | Resp 16 | Ht 64.0 in | Wt 218.0 lb

## 2012-11-15 DIAGNOSIS — N949 Unspecified condition associated with female genital organs and menstrual cycle: Secondary | ICD-10-CM

## 2012-11-15 DIAGNOSIS — R102 Pelvic and perineal pain unspecified side: Secondary | ICD-10-CM

## 2012-11-15 DIAGNOSIS — A6000 Herpesviral infection of urogenital system, unspecified: Secondary | ICD-10-CM

## 2012-11-15 DIAGNOSIS — Z131 Encounter for screening for diabetes mellitus: Secondary | ICD-10-CM

## 2012-11-15 LAB — GLUCOSE, POCT (MANUAL RESULT ENTRY): POC Glucose: 85 mg/dl (ref 70–99)

## 2012-11-15 MED ORDER — VALACYCLOVIR HCL 1 G PO TABS: 500.0000 mg | ORAL_TABLET | Freq: Every day | ORAL | Status: DC

## 2012-11-15 MED ORDER — FLUCONAZOLE 150 MG PO TABS: 150.0000 mg | ORAL_TABLET | Freq: Once | ORAL | Status: DC

## 2012-11-15 NOTE — Progress Notes (Signed)
   [address] [place] [address]   Phone [phone]  Subjective:    Patient ID: April [name], female    DOB: [birth year], 51 y.o.   MRN: [id number]  HPI  Pt presents to clinic with ~6wk h/o vaginal pain that she is associating with a herpes outbreak.  This is the worse outbreak she feels like she has even had, not only in length but also in numbers of lesions, she feels like she has >10 lesions.  She has tried multiple over the counter remedies including apple cider vinegar, alcohol without much help. The pain is getting much worse.  She is currently dating someone new but they have not become sexually active.  She has been under an increase amount of stress recently with her daughter who informed her yesterday that she was moving to [place] and quitting her local job.  Review of Systems  Genitourinary: Positive for vaginal pain.       Objective:   Physical Exam  Constitutional: She is oriented to person, place, and time. She appears well-developed.  HENT:  Head: Normocephalic and atraumatic.  Right Ear: External ear normal.  Left Ear: External ear normal.  Pulmonary/Chest: Effort normal.  Genitourinary:          Pt showed me the multiple areas that she feels like are lesions after I was only able to find 2 ulcerations and she showed me multiple hair follicles.  Neurological: She is alert and oriented to person, place, and time.  Skin: Skin is warm and dry.  Psychiatric: She has a normal mood and affect. Her behavior is normal. Judgment and thought content normal.          Assessment & Plan:   1. Genital herpes    2. Screening for diabetes mellitus (DM)  POCT glucose (manual entry)  3. Vaginal pain  valACYclovir (VALTREX) 1000 MG tablet   Pt appears to have considerable skin irritation, where she may have burned the skin with OTC remedies.  Will treat with a diflucan for the possibility of yeast because I was unable to do a scraping due to pain.  Will use OTC  hydrocortisone 1% cream nightly for 5-7 nights.  She will use a diaper rash barrier cream to help sooth the inflamed area.  She will start on valtrex for the current lesions though due to timing may not help a lot but will also continue daily treatment for suppression due to new boyfriend who she would like to become sexually active with in the future and he does not have the HSV virus (he has been tested).  Answered patient's questions.  She agrees with the plan.

## 2012-12-16 ENCOUNTER — Ambulatory Visit (INDEPENDENT_AMBULATORY_CARE_PROVIDER_SITE_OTHER): Payer: Managed Care, Other (non HMO) | Admitting: Family Medicine

## 2012-12-16 VITALS — BP 114/77 | HR 80 | Temp 98.9°F | Resp 18 | Wt 225.0 lb

## 2012-12-16 DIAGNOSIS — G4710 Hypersomnia, unspecified: Secondary | ICD-10-CM

## 2012-12-16 DIAGNOSIS — N951 Menopausal and female climacteric states: Secondary | ICD-10-CM

## 2012-12-16 DIAGNOSIS — IMO0001 Reserved for inherently not codable concepts without codable children: Secondary | ICD-10-CM

## 2012-12-16 DIAGNOSIS — F329 Major depressive disorder, single episode, unspecified: Secondary | ICD-10-CM

## 2012-12-16 DIAGNOSIS — F32A Depression, unspecified: Secondary | ICD-10-CM

## 2012-12-16 DIAGNOSIS — R5381 Other malaise: Secondary | ICD-10-CM

## 2012-12-16 DIAGNOSIS — R51 Headache: Secondary | ICD-10-CM

## 2012-12-16 DIAGNOSIS — M797 Fibromyalgia: Secondary | ICD-10-CM

## 2012-12-16 DIAGNOSIS — F3289 Other specified depressive episodes: Secondary | ICD-10-CM

## 2012-12-16 DIAGNOSIS — R5383 Other fatigue: Secondary | ICD-10-CM

## 2012-12-16 LAB — COMPREHENSIVE METABOLIC PANEL
ALT: 16 U/L (ref 0–35)
Albumin: 3.8 g/dL (ref 3.5–5.2)
CO2: 26 mEq/L (ref 19–32)
Calcium: 9.3 mg/dL (ref 8.4–10.5)
Chloride: 107 mEq/L (ref 96–112)
Glucose, Bld: 89 mg/dL (ref 70–99)
Sodium: 142 mEq/L (ref 135–145)
Total Protein: 5.9 g/dL — ABNORMAL LOW (ref 6.0–8.3)

## 2012-12-16 LAB — POCT CBC
Granulocyte percent: 66.6 %G (ref 37–80)
HCT, POC: 46.4 % (ref 37.7–47.9)
Hemoglobin: 14.5 g/dL (ref 12.2–16.2)
Lymph, poc: 1.8 (ref 0.6–3.4)
MCV: 99.1 fL — AB (ref 80–97)
POC Granulocyte: 4.3 (ref 2–6.9)
POC LYMPH PERCENT: 28 %L (ref 10–50)

## 2012-12-16 LAB — TSH: TSH: 0.748 u[IU]/mL (ref 0.350–4.500)

## 2012-12-16 MED ORDER — GABAPENTIN 100 MG PO CAPS: 100.0000 mg | ORAL_CAPSULE | Freq: Three times a day (TID) | ORAL | Status: DC

## 2012-12-16 MED ORDER — DULOXETINE HCL 30 MG PO CPEP: 90.0000 mg | ORAL_CAPSULE | Freq: Every day | ORAL | Status: DC

## 2012-12-16 NOTE — Patient Instructions (Addendum)
Try to wake up the same time every day - it is ok to stay up for 3 min and then take a nap on days off. Start with neurontin 100 mg at night - and then every 5 days increase by 1 pill until pain and hot flashes are improved or you reach 300mg  each night. Do sleep study. Increase Cymbalta to 90 mg to help with pain and depression.

## 2012-12-16 NOTE — Progress Notes (Signed)
[address] [place] [address]   Phone [phone]  Subjective:    Patient ID: April [name], female    DOB: 12-12-1960, 52 y.o.   MRN: [id number]  HPI  Pt presents to clinic with headaches and extreme fatigue.  She has noticed the fatigue since about October and it seems to be getting worse.  She has missed 4 days of work in the last 6 work days due to her inability to get out of bed because she is so tired.  She feels like some days her motivation is there she just does not have the energy and other days she knows some of the problem is her motivation.  Some days she gets to worse but just sits there and does nothing because she is to tired to move or think.  She feels like she sleeps ok, but she knows that she wakes up a lot due to hot flashes and pain that makes her move her position.  She sleep about 10-16 hours a day - goes to be bed about the same time every night about 10 and tries to get up about 7-8 am.  She is unable to watch a movie because she falls asleep immediately.  She falls asleep when just laying on the couch.  She has never fallen asleep when driving or at stoplights.  She does not think that she snores.  She has noticed that her HAs are more frequent - gets daily HAs and then sometimes the headaches get worse and turn into migraines.  She rarely takes ibuprofen and does not typically use medication for her headaches.  She does not feel depressed, but she has a lot going on with her mother's health, her daughter who has sever bipolar just left home and she worries about her, she recently broke up with her boyfriend but she is ok with it because they are still friends. Her fibromyalgia is better on the Cymbalta, she rarely takes her flexeril.   Review of Systems  Constitutional: Negative for fever and chills.  HENT: Positive for neck pain.   Musculoskeletal: Positive for myalgias and arthralgias.  Neurological: Positive for headaches. Negative for dizziness, syncope and  numbness.  Psychiatric/Behavioral: Positive for sleep disturbance (increased sleep).       Objective:   Physical Exam  Vitals reviewed. Constitutional: She is oriented to person, place, and time. She appears well-developed and well-nourished.  HENT:  Head: Normocephalic and atraumatic.  Right Ear: External ear normal.  Left Ear: External ear normal.  Eyes: Conjunctivae normal are normal.  Neck: Neck supple. No thyromegaly present.  Cardiovascular: Normal rate, regular rhythm and normal heart sounds.   No murmur heard. Pulmonary/Chest: Effort normal and breath sounds normal.  Lymphadenopathy:    She has no cervical adenopathy.  Neurological: She is alert and oriented to person, place, and time.  Skin: Skin is warm and dry.  Psychiatric: She has a normal mood and affect. Her behavior is normal. Judgment and thought content normal.   Pt filled out Becks depression scale and scored a 25 which shows moderate depression.   Results for orders placed in visit on 12/16/12  POCT CBC      Component Value Range   WBC 6.4  4.6 - 10.2 K/uL   Lymph, poc 1.8  0.6 - 3.4   POC LYMPH PERCENT 28.0  10 - 50 %L   MID (cbc) 0.3  0 - 0.9   POC MID % 5.4  0 - 12 %  M   POC Granulocyte 4.3  2 - 6.9   Granulocyte percent 66.6  37 - 80 %G   RBC 4.68  4.04 - 5.48 M/uL   Hemoglobin 14.5  12.2 - 16.2 g/dL   HCT, POC 84.1  32.4 - 47.9 %   MCV 99.1 (*) 80 - 97 fL   MCH, POC 31.0  27 - 31.2 pg   MCHC 31.3 (*) 31.8 - 35.4 g/dL   RDW, POC 40.1     Platelet Count, POC 332  142 - 424 K/uL   MPV 9.3  0 - 99.8 fL  TSH      Component Value Range   TSH 0.748  0.350 - 4.500 uIU/mL  COMPREHENSIVE METABOLIC PANEL      Component Value Range   Sodium 142  135 - 145 mEq/L   Potassium 4.3  3.5 - 5.3 mEq/L   Chloride 107  96 - 112 mEq/L   CO2 26  19 - 32 mEq/L   Glucose, Bld 89  70 - 99 mg/dL   BUN 12  6 - 23 mg/dL   Creat 0.27  2.53 - 6.64 mg/dL   Total Bilirubin 0.3  0.3 - 1.2 mg/dL   Alkaline Phosphatase 91   39 - 117 U/L   AST 20  0 - 37 U/L   ALT 16  0 - 35 U/L   Total Protein 5.9 (*) 6.0 - 8.3 g/dL   Albumin 3.8  3.5 - 5.2 g/dL   Calcium 9.3  8.4 - 40.3 mg/dL  VITAMIN K74      Component Value Range   Vitamin B-12 251  211 - 911 pg/mL        Assessment & Plan:   1. Fatigue  POCT CBC, TSH, Comprehensive metabolic panel, Vitamin B12  2. Headache  gabapentin (NEURONTIN) 100 MG capsule  3. Hypersomnolence  Ambulatory referral to Sleep Studies  4. Depression  DULoxetine (CYMBALTA) 30 MG capsule  5. Fibromyalgia  DULoxetine (CYMBALTA) 30 MG capsule, gabapentin (NEURONTIN) 100 MG capsule  6. Hot flashes, menopausal  gabapentin (NEURONTIN) 100 MG capsule   1- ? Cause - need to rule out sleep apnea and will order a sleep study - will try to help decrease anything that can be causing sleep disturbance like hot flashes and fibromyalgia pain.  Will check labs 2- probably related to lack of good sleep or over sleep - Pt to take NSAIDs when HAs get really bad - may want to add daily prophylaxis in the future but will wait for labs and sleep study result to return - may be worse due to disrupted sleep habits 3-do sleep study - pt to use good sleep hygiene - pt already sleeps with fan blowing on her - will go to bed about same time and wake same time and if pt wants to sleep later - she should wake up for about 30 mins and then take a nap 4- will increase cymbalta to help with continued depression and expect it to help some with her fibromyalgia pain 5- will add Neurontin and hope that helps the nerve pain, pt to use Flexeril at night when pain is really bad - will start with a low dose of neurontin due to current fatigue and SE of drowsy and only start with qhs dosing.  6- add neurontin to help with hot flashes - do not want to add clonidine due to drowsy side effect  D/w Dr Patsy Lager.

## 2013-03-17 ENCOUNTER — Telehealth: Payer: Self-pay

## 2013-03-17 NOTE — Telephone Encounter (Signed)
PATIENT STATES THAT SHE SEES A RHEUMATOLOGIST FOR ARTHRITIS IN HER KNEE. SHE WANTS TO KNOW IF SHE CAN COME HERE TO RECEIVE HER CORTISONE SHOT SINCE SHE CANNOT WAIT THE TWO WEEKS TO GO BACK TO HER RHEUMATOLOGIST. PLEASE CALL AT [phone]

## 2013-03-17 NOTE — Telephone Encounter (Signed)
Please advise. You are listed as PCP.

## 2013-03-18 NOTE — Telephone Encounter (Signed)
I do not routinely do this.  If she would like to see Dr Neva Seat for evaluation of possible injection that is fine but I cannot promise he will do it for her.  I really think it would be best if the rheumatologist did it for her in the past that she wait.

## 2013-03-19 NOTE — Telephone Encounter (Signed)
Advised her Dr Neva Seat may help her, she plans to come in to see you tomorrow evening. To you FYI Giovannie Scerbo

## 2013-03-19 NOTE — Telephone Encounter (Signed)
Left message for her to call me back. 

## 2013-03-20 ENCOUNTER — Ambulatory Visit (INDEPENDENT_AMBULATORY_CARE_PROVIDER_SITE_OTHER): Payer: Managed Care, Other (non HMO) | Admitting: Family Medicine

## 2013-03-20 VITALS — BP 138/82 | HR 69 | Temp 98.5°F | Resp 18 | Ht 64.0 in | Wt 219.0 lb

## 2013-03-20 DIAGNOSIS — M25562 Pain in left knee: Secondary | ICD-10-CM

## 2013-03-20 DIAGNOSIS — IMO0001 Reserved for inherently not codable concepts without codable children: Secondary | ICD-10-CM

## 2013-03-20 DIAGNOSIS — M25561 Pain in right knee: Secondary | ICD-10-CM

## 2013-03-20 DIAGNOSIS — M25569 Pain in unspecified knee: Secondary | ICD-10-CM

## 2013-03-20 MED ORDER — MELOXICAM 7.5 MG PO TABS: 7.5000 mg | ORAL_TABLET | Freq: Every day | ORAL | Status: DC

## 2013-03-20 NOTE — Progress Notes (Signed)
Subjective:    Patient ID: April [name], female    DOB: November 16, 1961, 52 y.o.   MRN: [id number]  HPI April [name] is a 52 y.o. female  Knee pain - bilaterral. Hx of osteoarthritis in both knees - L worse than right.  Current pain flare since 1 week ago - NKI, out of the blue.  No known inciting event. No redness - stifffness, feels warm, hurts to bend down on knee. Similar pain as before injection. Had been doing ok prior to last week. No known hx of gout. More sore to get up after seated position.   Followed by Dr. Corliss Skains for fibromyalgia. Has appt in June, unable to be seen for 2 more weeks. Has had injections in knees once before- unknown injection - possible steroid injection.  Last injection atleast 6 months ago. Had been having problems standing up and noted to have arthritis - this was reason for injection around beginningof last year - R knee injected initially, then L one in follow up.     Hx of fibromyalgia - takes cymbalta 60mg  once per day, weaning neurontin and flexeril. Off neurontin past 2 weeks.  Has tried new diet past month.  No more processed meat, less dairy.    April 2013, and 12/17/12  notes from [facility] reviewed.   Past Medical History  Diagnosis Date  . Obesity   . Migraine   . Fibromyalgia 2013    diagnosed by Dr. Corliss Skains  . Allergy   . Arthritis   . Depression    History reviewed. No pertinent past surgical history. History   Social History  . Marital Status: Single    Spouse Name: N/A    Number of Children: N/A  . Years of Education: N/A   Occupational History  . Not on file.   Social History Main Topics  . Smoking status: Former Smoker    Quit date: 01/21/1994  . Smokeless tobacco: Never Used  . Alcohol Use: 0.0 oz/week    2-4 drink(s) per week  . Drug Use: No  . Sexually Active: Yes    Birth Control/ Protection: None   Other Topics Concern  . Not on file   Social History Narrative  . No narrative on file     Review of Systems   Constitutional: Negative for fever and chills.  Musculoskeletal: Positive for arthralgias. Negative for joint swelling.  Skin: Negative for color change and rash.       Objective:   Physical Exam  Vitals reviewed. Constitutional: She is oriented to person, place, and time. She appears well-developed and well-nourished.  Pulmonary/Chest: Effort normal.  Musculoskeletal:       Right knee: She exhibits normal range of motion, no swelling, no effusion, no ecchymosis, no erythema, no LCL laxity, normal patellar mobility, normal meniscus and no MCL laxity. Tenderness (minimal.  negative lachman and mcmurray. ) found. Medial joint line tenderness noted. No patellar tendon tenderness noted.       Left knee: She exhibits normal range of motion, no swelling, no effusion, no erythema, no LCL laxity, normal meniscus and no MCL laxity. Tenderness found. Lateral joint line tenderness noted.  Neurological: She is alert and oriented to person, place, and time.  Skin: Skin is warm.  Psychiatric: She has a normal mood and affect. Her behavior is normal.   Noted pain with full extension of L knee - under kneecap, positive clark's compression testing.      Assessment & Plan:  [name]  is a 52 y.o. female Knee pain, bilateral - Plan: meloxicam (MOBIC) 7.5 MG tablet   Hx of fibromyalgia, knee pain now of 1 week duration - possible underlying oa vs degenerative meniscal disease vs PFPS, with hx of overweight. Discussed trial of mobic for next week, restart neurontin as sx's have occurred off this medicine without known injury or change in activity, and if still not improving in next week, consider injection - likely left side initially. ROM, HEp - h/o on pfps, discussed water based exercise, and exercise bike. Understanding expressed and rtc precautions given.   Meds ordered this encounter  Medications  . meloxicam (MOBIC) 7.5 MG tablet    Sig: Take 1 tablet (7.5 mg total) by mouth daily.     Dispense:  30 tablet    Refill:  0     Patient Instructions  Range of motion, handout on patellofemoral pain syndrome for other exercises - exercise bike or pool based exercise.  Start mobic - up to twice per day (stop if any stomach upset), and restart Neurontin.  If not improving next week to 10 days - recheck to discuss injection at that point. Return to the clinic or go to the nearest emergency room if any of your symptoms worsen or new symptoms occur.

## 2013-03-20 NOTE — Patient Instructions (Signed)
Range of motion, handout on patellofemoral pain syndrome for other exercises - exercise bike or pool based exercise.  Start mobic - up to twice per day (stop if any stomach upset), and restart Neurontin.  If not improving next week to 10 days - recheck to discuss injection at that point. Return to the clinic or go to the nearest emergency room if any of your symptoms worsen or new symptoms occur.

## 2013-04-21 ENCOUNTER — Other Ambulatory Visit: Payer: Self-pay | Admitting: Physician Assistant

## 2013-04-21 ENCOUNTER — Other Ambulatory Visit: Payer: Self-pay | Admitting: Family Medicine

## 2013-05-01 ENCOUNTER — Ambulatory Visit (INDEPENDENT_AMBULATORY_CARE_PROVIDER_SITE_OTHER): Payer: Managed Care, Other (non HMO) | Admitting: Family Medicine

## 2013-05-01 VITALS — BP 142/92 | HR 82 | Temp 97.9°F | Resp 18 | Ht 64.5 in | Wt 223.0 lb

## 2013-05-01 DIAGNOSIS — IMO0002 Reserved for concepts with insufficient information to code with codable children: Secondary | ICD-10-CM

## 2013-05-01 DIAGNOSIS — M25569 Pain in unspecified knee: Secondary | ICD-10-CM

## 2013-05-01 DIAGNOSIS — M1711 Unilateral primary osteoarthritis, right knee: Secondary | ICD-10-CM

## 2013-05-01 DIAGNOSIS — M1710 Unilateral primary osteoarthritis, unspecified knee: Secondary | ICD-10-CM

## 2013-05-01 DIAGNOSIS — G8929 Other chronic pain: Secondary | ICD-10-CM

## 2013-05-01 MED ORDER — MELOXICAM 7.5 MG PO TABS: 7.5000 mg | ORAL_TABLET | Freq: Two times a day (BID) | ORAL | Status: DC

## 2013-05-01 MED ORDER — TRIAMCINOLONE ACETONIDE 40 MG/ML IJ SUSP
40.0000 mg | Freq: Once | INTRAMUSCULAR | Status: AC
  Administered 2013-05-01: 40 mg via INTRA_ARTICULAR

## 2013-05-01 NOTE — Progress Notes (Signed)
   [address] [place] [address]   Phone [phone]  Subjective:    Patient ID: April [name], female    DOB: 1961-08-22, 52 y.o.   MRN: [id number]  HPI  Pt presents to clinic with continued knee pain since her last visit.  Her L knee is actually a little better with the Mobic but her R knee is getting worse.  She hurts to move.  She sits [place] style on her couch a lot and that seems to make her knee pain worse.  She feels better when she elevated and straightens her legs.  She uses ice which does not really help.  She is on her meds for her fibromyalgia and is doing ok.  She just got laid off so that is stressful but she plans on getting another job.    Review of Systems  Musculoskeletal: Positive for myalgias (normal for her) and arthralgias (L>R knee ). Negative for joint swelling.       Objective:   Physical Exam  Vitals reviewed. Constitutional: She is oriented to person, place, and time. She appears well-developed and well-nourished.  HENT:  Head: Normocephalic and atraumatic.  Right Ear: External ear normal.  Left Ear: External ear normal.  Pulmonary/Chest: Effort normal.  Musculoskeletal:       Right knee: She exhibits normal range of motion and no swelling. Tenderness found. Medial joint line and lateral joint line (more tender than the medial joint line) tenderness noted.       Left knee: She exhibits normal range of motion, no swelling, no erythema, no LCL laxity and normal patellar mobility. Tenderness found. Medial joint line and lateral joint line (lateral joint line is more tender then the medial side) tenderness noted. No patellar tendon tenderness noted.  Neurological: She is alert and oriented to person, place, and time.  Skin: Skin is warm and dry.  Psychiatric: She has a normal mood and affect. Her behavior is normal. Judgment and thought content normal.   Procedure:  Risks (including but not limited to bleeding and infection), benefits, and alternatives  discussed for R knee superolateral injection.  Verbal consent obtained after any questions were answered. Landmarks noted, and marked as needed. Area cleansed with Betadine x1, ethyl chloride spray for topical anesthesia, followed by alcohol swab.  Injected with 1cc kenalog 40,mg/ml with 3 cc lidocaine 2%plain.  No complications, noted initial improvement in pain.  Bandage applied.  RTC precautions discussed in regards to injection.     Assessment & Plan:  Chronic knee pain, right  Osteoarthritis of right knee - Plan: triamcinolone acetonide (KENALOG-40) injection 40 mg - pt to continue her Mobic - f/u if this really helps and wants the L knee done also.  She is to ice and elevate her knee over the next several hours and she is not to overuse the knee in the next couple of days.  D/w and seen with Dr Neva Seat.  Benny Lennert PA-C 05/01/2013 9:05 PM

## 2013-05-02 NOTE — Progress Notes (Signed)
Patient discussed with, and observed/assisted with injection by Ms. Weber. Agree with assessment and plan of care per her note.

## 2013-06-09 ENCOUNTER — Telehealth: Payer: Self-pay

## 2013-06-09 NOTE — Telephone Encounter (Signed)
Pt dropped off a form to be completed by Benny Lennert, for financial assistance for medication  Form forward to [employer], who states she will put on sarah box

## 2013-06-10 NOTE — Telephone Encounter (Signed)
lmom that form is ready for pickup. Form is in pickup drawer at 102.

## 2013-08-15 ENCOUNTER — Encounter: Payer: Self-pay | Admitting: Radiology

## 2013-08-18 ENCOUNTER — Telehealth: Payer: Self-pay

## 2013-08-18 NOTE — Telephone Encounter (Signed)
Do you have forms for her? I do not

## 2013-08-18 NOTE — Telephone Encounter (Signed)
PT STATES SHE HAD DROPPED OFF A FORM TO GET FILLED OUT SO SHE CAN GET FREE MEDICINE BECAUSE OF HER JOB LOSS. HASN'T HEARD FROM ANYONE PLEASE CALL [phone]

## 2013-08-19 NOTE — Telephone Encounter (Signed)
Pt advised we had filled form out a couple of months ago and called her to come p/up, but she didn't get by to get it. It is not still in drawer for p/up and do not see it scanned in chart yet. Pt stated she will just print off another form and bring it in today for April [name] or April [name] to fill out for Cymbalta that she takes for her fibromyalgia.

## 2013-10-09 ENCOUNTER — Other Ambulatory Visit: Payer: Self-pay

## 2013-12-01 DIAGNOSIS — Z0271 Encounter for disability determination: Secondary | ICD-10-CM

## 2014-02-25 ENCOUNTER — Other Ambulatory Visit: Payer: Self-pay | Admitting: Physician Assistant

## 2014-05-08 ENCOUNTER — Emergency Department (HOSPITAL_COMMUNITY): Payer: Self-pay

## 2014-05-08 ENCOUNTER — Encounter (HOSPITAL_COMMUNITY): Payer: Self-pay | Admitting: Emergency Medicine

## 2014-05-08 ENCOUNTER — Emergency Department (HOSPITAL_COMMUNITY)
Admission: EM | Admit: 2014-05-08 | Discharge: 2014-05-08 | Disposition: A | Discharge status: Home / Self Care | Payer: Self-pay | Attending: Emergency Medicine | Admitting: Emergency Medicine

## 2014-05-08 DIAGNOSIS — R079 Chest pain, unspecified: Secondary | ICD-10-CM

## 2014-05-08 DIAGNOSIS — R0602 Shortness of breath: Secondary | ICD-10-CM | POA: Insufficient documentation

## 2014-05-08 DIAGNOSIS — Z8679 Personal history of other diseases of the circulatory system: Secondary | ICD-10-CM | POA: Insufficient documentation

## 2014-05-08 DIAGNOSIS — Z87891 Personal history of nicotine dependence: Secondary | ICD-10-CM | POA: Insufficient documentation

## 2014-05-08 DIAGNOSIS — F329 Major depressive disorder, single episode, unspecified: Secondary | ICD-10-CM | POA: Insufficient documentation

## 2014-05-08 DIAGNOSIS — R0789 Other chest pain: Secondary | ICD-10-CM | POA: Insufficient documentation

## 2014-05-08 DIAGNOSIS — M129 Arthropathy, unspecified: Secondary | ICD-10-CM | POA: Insufficient documentation

## 2014-05-08 DIAGNOSIS — F411 Generalized anxiety disorder: Secondary | ICD-10-CM | POA: Insufficient documentation

## 2014-05-08 DIAGNOSIS — Z79899 Other long term (current) drug therapy: Secondary | ICD-10-CM | POA: Insufficient documentation

## 2014-05-08 DIAGNOSIS — E669 Obesity, unspecified: Secondary | ICD-10-CM | POA: Insufficient documentation

## 2014-05-08 DIAGNOSIS — F3289 Other specified depressive episodes: Secondary | ICD-10-CM | POA: Insufficient documentation

## 2014-05-08 LAB — BASIC METABOLIC PANEL
BUN: 7 mg/dL (ref 6–23)
CALCIUM: 9.6 mg/dL (ref 8.4–10.5)
CHLORIDE: 102 meq/L (ref 96–112)
CO2: 24 mEq/L (ref 19–32)
Creatinine, Ser: 0.8 mg/dL (ref 0.50–1.10)
GFR calc non Af Amer: 83 mL/min — ABNORMAL LOW (ref >90)
Glucose, Bld: 104 mg/dL — ABNORMAL HIGH (ref 70–99)
Potassium: 3.6 mEq/L — ABNORMAL LOW (ref 3.7–5.3)
Sodium: 139 mEq/L (ref 137–147)

## 2014-05-08 LAB — CBC
HEMATOCRIT: 42.4 % (ref 36.0–46.0)
Hemoglobin: 14.5 g/dL (ref 12.0–15.0)
MCH: 31.8 pg (ref 26.0–34.0)
MCHC: 34.2 g/dL (ref 30.0–36.0)
MCV: 93 fL (ref 78.0–100.0)
PLATELETS: 252 10*3/uL (ref 150–400)
RBC: 4.56 MIL/uL (ref 3.87–5.11)
RDW: 13.9 % (ref 11.5–15.5)
WBC: 4.2 10*3/uL (ref 4.0–10.5)

## 2014-05-08 LAB — I-STAT TROPONIN, ED: TROPONIN I, POC: 0 ng/mL (ref 0.00–0.08)

## 2014-05-08 MED ORDER — LORAZEPAM 1 MG PO TABS: 1.0000 mg | ORAL_TABLET | Freq: Three times a day (TID) | ORAL | Status: DC | PRN

## 2014-05-08 NOTE — ED Notes (Signed)
Per EMS - pt was at home today when her mother started acting up, then had substernal CP, ems administered 2 Nitro and 324 mg of ASA, no change in pain. Pt sts she did feel a little lightheaded. 12 lead unremarkable, NSR HR 70s. Initial BP 150/100, last BP was 141/88. HR 70. 100% on room air. 20 G in left hand.

## 2014-05-08 NOTE — ED Notes (Signed)
Pt sts she has been under a lot of stress at home with her mother who has dementia and keeps trying to escape and has now been acting differently than normal. sts she became very anxious and upset right before the CP started.

## 2014-05-08 NOTE — ED Provider Notes (Signed)
CSN: [id number]     Arrival date & time 05/08/14  1015 History   First MD Initiated Contact with Patient 05/08/14 1107     Chief Complaint  Patient presents with  . Chest Pain     (Consider location/radiation/quality/duration/timing/severity/associated sxs/prior Treatment) Patient is a 53 y.o. female presenting with chest pain. The history is provided by the patient.  Chest Pain  She presents for evaluation of chest pain. That started when she was trying to get her mother to come back in the house. Her mother has dementia, and an undefined type of psychiatric disorder. Her mother was brought into the emergency department, with the patient in the same ambulance, to be seen as a patient. The patient has never had this type of pain before. The pain is sharp, left upper chest, and radiates to her left scapula. The pain is constant, since 9:00 this morning, and worsens when she takes a deep breath. She has had some intermittent shortness of breath, but no cough, vomiting, diaphoresis, or persistent nausea. She does not smoke. She has borderline elevated cholesterol. She does not have a family history of early myocardial infarction. She does not take any regular medication. She does not have a PCP, currently. There are no other known modifying factors.  April [name] is a 53 y.o. female  Past Medical History  Diagnosis Date  . Obesity   . Migraine   . Fibromyalgia 2013    diagnosed by Dr. [name]  . Allergy   . Arthritis   . Depression    History reviewed. No pertinent past surgical history. Family History  Problem Relation Age of Onset  . Alcohol abuse Mother   . Arthritis Mother   . Hypertension Mother   . Heart disease Mother   . Dementia Mother   . Irritable bowel syndrome Mother   . Emphysema Father   . Cancer Father     bladder  . Bipolar disorder Daughter   . Depression Daughter   . Graves' disease Sister    History  Substance Use Topics  . Smoking status: Former  Smoker    Quit date: 01/21/1994  . Smokeless tobacco: Never Used  . Alcohol Use: 0.0 oz/week    2-4 drink(s) per week   OB History   Grav Para Term Preterm Abortions TAB SAB Ect Mult Living                 Review of Systems  Cardiovascular: Positive for chest pain.  All other systems reviewed and are negative.     Allergies  Hydrocodone  Home Medications   Prior to Admission medications   Medication Sig Start Date End Date Taking? Authorizing Provider  aspirin 325 MG tablet Take 650 mg by mouth every 6 (six) hours as needed (chest pain).   Yes Historical Provider, MD  cetirizine (ZYRTEC) 10 MG tablet Take 10 mg by mouth daily.   Yes Historical Provider, MD  valACYclovir (VALTREX) 1000 MG tablet Take 500 mg by mouth daily as needed (outbreak).   Yes Historical Provider, MD  LORazepam (ATIVAN) 1 MG tablet Take 1 tablet (1 mg total) by mouth 3 (three) times daily as needed for anxiety. 05/08/14   Richarda Blade, MD   BP 132/77  Pulse 66  Temp(Src) 97.8 F (36.6 C) (Oral)  Resp 12  SpO2 97% Physical Exam  Nursing note and vitals reviewed. Constitutional: She is oriented to person, place, and time. She appears well-developed and well-nourished. No distress.  HENT:  Head: Normocephalic and atraumatic.  Eyes: Conjunctivae and EOM are normal. Pupils are equal, round, and reactive to light.  Neck: Normal range of motion and phonation normal. Neck supple.  Cardiovascular: Normal rate, regular rhythm and intact distal pulses.   Pulmonary/Chest: Effort normal and breath sounds normal. She exhibits no tenderness.  Abdominal: Soft. She exhibits no distension. There is no tenderness. There is no guarding.  Musculoskeletal: Normal range of motion.  Neurological: She is alert and oriented to person, place, and time. She exhibits normal muscle tone.  Skin: Skin is warm and dry.  Psychiatric: She has a normal mood and affect. Her behavior is normal. Judgment and thought content normal.   She is calm, and comfortable. She smiles during the evaluation.    ED Course  Procedures (including critical care time) Medications - No data to display  Patient Vitals for the past 24 hrs:  BP Temp Temp src Pulse Resp SpO2  05/08/14 1125 132/77 mmHg - - 66 12 97 %  05/08/14 1025 115/80 mmHg 97.8 F (36.6 C) Oral 64 22 -       Labs Review Labs Reviewed  BASIC METABOLIC PANEL - Abnormal; Notable for the following:    Potassium 3.6 (*)    Glucose, Bld 104 (*)    GFR calc non Af Amer 83 (*)    All other components within normal limits  CBC  I-STAT TROPOININ, ED    Imaging Review Dg Chest 2 View  05/08/2014   CLINICAL DATA:  CHEST PAIN  EXAM: CHEST  2 VIEW  COMPARISON:  Two-view chest 01/04/2012.  FINDINGS: The heart size and mediastinal contours are within normal limits. Both lungs are clear. The visualized skeletal structures are unremarkable.  IMPRESSION: No active cardiopulmonary disease.   Electronically Signed   By: Margaree Mackintosh M.D.   On: 05/08/2014 10:54       Date: 05/08/14  Rate: 66  Rhythm: normal sinus rhythm  QRS Axis: normal  PR and QT Intervals: normal  ST/T Wave abnormalities: normal  PR and QRS Conduction Disutrbances:none  Narrative Interpretation:   Old EKG Reviewed: unchanged    EKG Interpretation None      MDM   Final diagnoses:  Nonspecific chest pain  Anxiety state    Chest pain, which is atypical for an acute coronary event. She is at low risk for cardiac disorders. She has significant social stressors that are causing anxiety and distress.  Nursing Notes Reviewed/ Care Coordinated Applicable Imaging Reviewed Interpretation of Laboratory Data incorporated into ED treatment  The patient appears reasonably screened and/or stabilized for discharge and I doubt any other medical condition or other [facility] requiring further screening, evaluation, or treatment in the ED at this time prior to discharge.  Plan: Home Medications- Ativan; Home  Treatments- rest; return here if the recommended treatment, does not improve the symptoms; Recommended follow up- PCP f/u asap    Richarda Blade, MD 05/08/14 1142

## 2014-05-08 NOTE — ED Notes (Signed)
Pharmacy tech at bedside 

## 2014-06-05 ENCOUNTER — Other Ambulatory Visit: Payer: Self-pay | Admitting: Physician Assistant

## 2014-09-18 ENCOUNTER — Other Ambulatory Visit: Payer: Self-pay

## 2015-04-17 ENCOUNTER — Emergency Department (HOSPITAL_COMMUNITY): Payer: Self-pay

## 2015-04-17 ENCOUNTER — Encounter (HOSPITAL_COMMUNITY): Payer: Self-pay | Admitting: Emergency Medicine

## 2015-04-17 ENCOUNTER — Emergency Department (HOSPITAL_COMMUNITY)
Admission: EM | Admit: 2015-04-17 | Discharge: 2015-04-17 | Disposition: A | Discharge status: Home / Self Care | Payer: Self-pay | Attending: Emergency Medicine | Admitting: Emergency Medicine

## 2015-04-17 DIAGNOSIS — Z79899 Other long term (current) drug therapy: Secondary | ICD-10-CM | POA: Insufficient documentation

## 2015-04-17 DIAGNOSIS — E669 Obesity, unspecified: Secondary | ICD-10-CM | POA: Insufficient documentation

## 2015-04-17 DIAGNOSIS — Z8679 Personal history of other diseases of the circulatory system: Secondary | ICD-10-CM | POA: Insufficient documentation

## 2015-04-17 DIAGNOSIS — Z87891 Personal history of nicotine dependence: Secondary | ICD-10-CM | POA: Insufficient documentation

## 2015-04-17 DIAGNOSIS — Z8739 Personal history of other diseases of the musculoskeletal system and connective tissue: Secondary | ICD-10-CM | POA: Insufficient documentation

## 2015-04-17 DIAGNOSIS — R079 Chest pain, unspecified: Secondary | ICD-10-CM | POA: Insufficient documentation

## 2015-04-17 DIAGNOSIS — F419 Anxiety disorder, unspecified: Secondary | ICD-10-CM | POA: Insufficient documentation

## 2015-04-17 HISTORY — DX: Anxiety disorder, unspecified: F41.9

## 2015-04-17 LAB — CBC WITH DIFFERENTIAL/PLATELET
BASOS ABS: 0 10*3/uL (ref 0.0–0.1)
Basophils Relative: 0 % (ref 0–1)
EOS PCT: 0 % (ref 0–5)
Eosinophils Absolute: 0 10*3/uL (ref 0.0–0.7)
HCT: 49.6 % — ABNORMAL HIGH (ref 36.0–46.0)
HEMOGLOBIN: 16.9 g/dL — AB (ref 12.0–15.0)
LYMPHS PCT: 12 % (ref 12–46)
Lymphs Abs: 0.9 10*3/uL (ref 0.7–4.0)
MCH: 31.8 pg (ref 26.0–34.0)
MCHC: 34.1 g/dL (ref 30.0–36.0)
MCV: 93.2 fL (ref 78.0–100.0)
MONOS PCT: 8 % (ref 3–12)
Monocytes Absolute: 0.6 10*3/uL (ref 0.1–1.0)
Neutro Abs: 6.4 10*3/uL (ref 1.7–7.7)
Neutrophils Relative %: 80 % — ABNORMAL HIGH (ref 43–77)
PLATELETS: 310 10*3/uL (ref 150–400)
RBC: 5.32 MIL/uL — ABNORMAL HIGH (ref 3.87–5.11)
RDW: 13.7 % (ref 11.5–15.5)
WBC: 7.9 10*3/uL (ref 4.0–10.5)

## 2015-04-17 LAB — BASIC METABOLIC PANEL
Anion gap: 12 (ref 5–15)
BUN: 9 mg/dL (ref 6–20)
CALCIUM: 9 mg/dL (ref 8.9–10.3)
CO2: 20 mmol/L — ABNORMAL LOW (ref 22–32)
Chloride: 109 mmol/L (ref 101–111)
Creatinine, Ser: 0.98 mg/dL (ref 0.44–1.00)
GFR calc Af Amer: >60 mL/min (ref >60)
Glucose, Bld: 117 mg/dL — ABNORMAL HIGH (ref 65–99)
POTASSIUM: 3.8 mmol/L (ref 3.5–5.1)
SODIUM: 141 mmol/L (ref 135–145)

## 2015-04-17 LAB — TROPONIN I: Troponin I: <0.03 ng/mL (ref <0.031)

## 2015-04-17 MED ORDER — LORAZEPAM 1 MG PO TABS: 0.5000 mg | ORAL_TABLET | Freq: Three times a day (TID) | ORAL | Status: DC | PRN

## 2015-04-17 MED ORDER — LORAZEPAM 1 MG PO TABS
1.0000 mg | ORAL_TABLET | Freq: Once | ORAL | Status: AC
  Administered 2015-04-17: 1 mg via ORAL
  Filled 2015-04-17: qty 1

## 2015-04-17 NOTE — ED Notes (Signed)
Pt ambulatory with steady gait to radiology 

## 2015-04-17 NOTE — ED Notes (Signed)
Bed: WTR6 Expected date:  Expected time:  Means of arrival:  Comments: EMS-anxiety

## 2015-04-17 NOTE — Discharge Instructions (Signed)
Panic Attacks Panic attacks are sudden, short feelings of great fear or discomfort. You may have them for no reason when you are relaxed, when you are uneasy (anxious), or when you are sleeping.  HOME CARE  Take all your medicines as told.  Check with your doctor before starting new medicines.  Keep all doctor visits. GET HELP IF:  You are not able to take your medicines as told.  Your symptoms do not get better.  Your symptoms get worse. GET HELP RIGHT AWAY IF:  Your attacks seem different than your normal attacks.  You have thoughts about hurting yourself or others.  You take panic attack medicine and you have a side effect. MAKE SURE YOU:  Understand these instructions.  Will watch your condition.  Will get help right away if you are not doing well or get worse. Document Released: 12/23/2010 Document Revised: 09/10/2013 Document Reviewed: 07/04/2013 [facility] Patient Information 2015 [place], [place]. This information is not intended to replace advice given to you by your health care provider. Make sure you discuss any questions you have with your health care provider.  Emergency Department Resource Guide 1) Find a Doctor and Pay Out of Pocket Although you won't have to find out who is covered by your insurance plan, it is a good idea to ask around and get recommendations. You will then need to call the office and see if the doctor you have chosen will accept you as a new patient and what types of options they offer for patients who are self-pay. Some doctors offer discounts or will set up payment plans for their patients who do not have insurance, but you will need to ask so you aren't surprised when you get to your appointment.  2) Contact Your Local Health Department Not all health departments have doctors that can see patients for sick visits, but many do, so it is worth a call to see if yours does. If you don't know where your local health department is, you can check in  your phone book. The CDC also has a tool to help you locate your state's health department, and many state websites also have listings of all of their local health departments.  3) Find a [facility] If your illness is not likely to be very severe or complicated, you may want to try a walk in clinic. These are popping up all over the country in pharmacies, drugstores, and shopping centers. They're usually staffed by nurse practitioners or physician assistants that have been trained to treat common illnesses and complaints. They're usually fairly quick and inexpensive. However, if you have serious medical issues or chronic medical problems, these are probably not your best option.  No Primary Care Doctor: - Call Health Connect at  [phone] - they can help you locate a primary care doctor that  accepts your insurance, provides certain services, etc. - Physician Referral Service- [phone]  Chronic Pain Problems: Organization         Address  Phone   Notes  [facility]  [phone] Patients need to be referred by their primary care doctor.   Medication Assistance: Organization         Address  Phone   Notes  [facility] Medication [facility] [address] [place], [place] [address] [phone] --Must be a resident of [place] -- Must have NO insurance coverage whatsoever (no Medicaid/ Medicare, etc.) -- The pt. MUST have a primary care doctor that  directs their care regularly and follows them in the community   MedAssist  [phone]   [employer]  [phone]    Agencies that provide inexpensive medical care: Organization         Address  Phone   Notes  [place]  [phone]   [facility]    [phone]   [facility] [place], [place] [address] [phone]   [place] [address], [place] [phone]    Planned Parenthood    [phone]   [facility]    [phone]   [place] and [place] [address], [place] Phone:  [phone], Fax:  [phone] Hours of Operation:  9 am - 6 pm, M-F.  Also accepts Medicaid/Medicare and self-pay.  [facility] for [address], [address], [place] Phone: [phone], Fax: [phone]. Hours of Operation:  8:30 am - 5:30 pm, M-F.  Also accepts Medicaid and self-pay.  [facility] [address] [place] Phone: [phone]   [place], [place], [place] [phone], Ext. [phone] Mondays & Thursdays: 7-9 AM.  First 15 patients are seen on a first come, first serve basis.    [place] Providers:  Organization         Address  Phone   Notes  [facility] [address],  [phone] Also accepts self-pay patients.  [facility] [address], [place]  [phone]   [place], [address], [place] [phone]   [facility] [address] [place] [phone]   Lucianne Lei [address], [place]   [phone] Only accepts [place] Access [place] patients after they have their name applied to their card.   Self-Pay (no insurance) in [facility]:  Organization         Address  Phone   Notes  Sickle Cell Patients, [facility] [place] [phone]   [facility] [place] [phone]   [facility] [place]  [place], [place], [place] [phone]   [facility]/Dr. Osei-Bonsu  [address], [place] or [address], [place] [phone] Phone number for both [place] and [place] locations is the  same.  Urgent Medical and [facility] [address] [place] [phone]   [facility] [address], [place] or [address] [phone]   [facility] [address], [place] [phone], phone; [phone], fax Sees patients 1st and 3rd Saturday of every month.  Must not qualify for public or private insurance (i.e. Medicaid, Medicare, [place] Health Choice, Veterans' Benefits)  Household income should be no more than 200% of the poverty level The clinic cannot treat you if you are pregnant or think you are pregnant  Sexually transmitted diseases are not treated at the clinic.    Dental Care: Organization         Address  Phone  Notes  [facility] [address] [place], [place] [phone] Accepts children up  to age 36 who are enrolled in Medicaid or [place] Health Choice; pregnant women with a Medicaid card; and children who have applied for Medicaid or [place] Health Choice, but were declined, whose parents can pay a reduced fee at time of service.  [facility] Department of [facility]  [address], [place] [phone] Accepts children up to age 46 who are enrolled in [place] or [place]; pregnant women with a Medicaid card; and children who have applied for Medicaid or [place] Health Choice, but were declined, whose parents can pay a reduced fee at time of service.  [place] Adult [facility] Access PRO[facility]  [place] [phone] Patients are seen by appointment only. Walk-ins are not accepted. [place] will see patients 1 years of age and older. Monday - Tuesday (8am-5pm) Most Wednesdays (8:30-5pm) $30 per visit, cash only  [facility] Adult Dental Access PROGRAM  [address], [facility] [phone] Patients are seen by appointment only. Walk-ins are not accepted. [place] will see patients  63 years of age and older. One Wednesday Evening (Monthly: Volunteer Based).  $30 per visit, cash only  [place]  [phone] for adults; Children under age 69, call Graduate Pediatric Dentistry at [phone]. Children aged 67-14, please call [phone] to request a pediatric application.  Dental services are provided in all areas of dental care including fillings, crowns and bridges, complete and partial dentures, implants, gum treatment, root canals, and extractions. Preventive care is also provided. Treatment is provided to both adults and children. Patients are selected via a lottery and there is often a waiting list.   [facility] [address] [place]  [phone] [email or web address]   [facility] [address] [place], [place] [phone], Ext. [phone] Second and Fourth Thursday of each month, opens at 6:30 AM; Clinic ends at 9 AM.  Patients are seen on a first-come first-served basis, and a limited number are seen during each clinic.   [facility]  [address] Hillard Danker [place], [place] [phone]   Eligibility Requirements You must have lived in [place], [place], or [place] counties for at least the last three months.   You cannot be eligible for state or federal sponsored [employer], including [employer], [place], or [employer].   You generally cannot be eligible for healthcare insurance through your employer.    How to apply: Eligibility screenings are held every Tuesday and Wednesday afternoon from 1:00 pm until 4:00 pm. You do not need an appointment for the interview!  [facility] [address], [place], [place]   [place]  [place] Department  [place]  [phone]    Behavioral Health Resources in the Community: Intensive Outpatient  Programs Organization         Address  Phone  Notes  [place]. [address], [place], [place] [phone]   [facility] Outpatient [address] [place], [place]   ADS: Alcohol & Drug [facility] [address] [place], [place]   [place] [address],  [place], [place] or [phone]   Substance Abuse Resources Organization         Address  Phone  Notes  Alcohol and Drug Services  [phone]   [facility]  [phone]   [facility] [place]   Chinita Pester  [phone]   Residential & Outpatient Substance Abuse Program  [phone]   Psychological Services Organization         Address  Phone  Notes  [facility] [place]  [place]  [phone]   [place] [address], [place] or [phone]    Mobile Crisis Teams Organization         Address  Phone  Notes  [facility], Mobile Crisis Care Unit  [phone]   [facility]ive Psychotherapeutic Services  [address] [place], [place]   [place] [address], [place] [phone]    Self-Help/Support Groups Organization         Address  Phone             Notes  Success. of [place] - variety of support groups  [place] Call for more information  Narcotics Anonymous (NA), [facility] [address], [employer] [place]  2 meetings at this location   Special educational needs teacher         Address  Phone  Notes  ASAP Residential Treatment [place],    [place]  [phone]   [facility]  [address] [place] [phone], [place], [place]   [place], [place] [phone] Admissions: 8am-3pm M-F  Incentives Substance [place] [address],    [place], [place]  [phone]   [facility] [address] [place], [place], [place]   [facility] [address] [place] [address],  [place], [place]   Insight Programs - Intensive Outpatient [address], [place], [place]   [facility] [address].,  [place], [place] [phone] or [phone]   [facility]idential Treatment Services (RTS) [address], [place], [place] Accepts Medicaid  [facility] [place] [address],  [place] [phone] Substance Abuse/Addiction Treatment   [facility] Organization         Address  Phone  Notes  [facility]  [phone]   Domenic Schwab, PhD [address] Arlis Porta [place], [place]   [phone] or [phone]   [place], [place] [phone]   [facility] [address] [place], [place] [phone] Insurance/Medicaid/sponsorship through [facility] and Families [address] [place]                                    [place], [place] [phone] [place] [address], [place] [phone]    Dr. Adele Schilder  [phone]   [facility] Dept. 1) [address], [place] 2) [place] 3)  [place] [address], [place] [phone]  [phone]   [place] [phone] or [phone] (After Hours)

## 2015-04-17 NOTE — ED Provider Notes (Signed)
CSN: [id number]     Arrival date & time 04/17/15  1604 History  This chart was scribed for non-physician practitioner Glendell Docker, NP working with Davonna Belling, MD by Lora Havens, ED Scribe. This patient was seen in WTR6/WTR6 and the patient's care was started at 4:26 PM.   Chief Complaint  Patient presents with  . Anxiety   Patient is a 54 y.o. female presenting with anxiety. The history is provided by the patient. No language interpreter was used.  Anxiety Associated symptoms include chest pain and shortness of breath.    HPI Comments: April [name] is a 54 y.o. female who was brought in by ambulance to the Emergency Department complaining of a recurrent anxiety attack with associated chest pian, and shortness of breath acute onset today. She was walking to the ED but was unable to make it and EMS was called by a by-stander. Pt is currently going through a break up with her husband. The chest pain  is exacerbated when she cries. Her chest currently feels heavy and she is short of breath. She has a history of anxiety but does not take any medication. She has no history of blood clots, hypertension, diabetes. She denies neck pain, cough, nausea, vomiting, and diarrhea.    Past Medical History  Diagnosis Date  . Obesity   . Migraine   . Fibromyalgia 2013    diagnosed by Dr. [name]  . Allergy   . Arthritis   . Depression   . Anxiety    History reviewed. No pertinent past surgical history. Family History  Problem Relation Age of Onset  . Alcohol abuse Mother   . Arthritis Mother   . Hypertension Mother   . Heart disease Mother   . Dementia Mother   . Irritable bowel syndrome Mother   . Emphysema Father   . Cancer Father     bladder  . Bipolar disorder Daughter   . Depression Daughter   . Graves' disease Sister    History  Substance Use Topics  . Smoking status: Former Smoker    Quit date: 01/21/1994  . Smokeless tobacco: Never Used  . Alcohol Use: 0.0  oz/week    2-4 Standard drinks or equivalent per week   OB History    No data available     Review of Systems  Respiratory: Positive for shortness of breath. Negative for cough.   Cardiovascular: Positive for chest pain.  Gastrointestinal: Negative for nausea, vomiting and diarrhea.  Musculoskeletal: Negative for neck pain.  Psychiatric/Behavioral: The patient is nervous/anxious.   All other systems reviewed and are negative.  Allergies  Hydrocodone  Home Medications   Prior to Admission medications   Medication Sig Start Date End Date Taking? Authorizing Provider  loratadine (CLARITIN) 10 MG tablet Take 10 mg by mouth daily.   Yes Historical Provider, MD  LORazepam (ATIVAN) 1 MG tablet Take 1 tablet (1 mg total) by mouth 3 (three) times daily as needed for anxiety. Patient not taking: Reported on 04/17/2015 05/08/14   [place] Bo, MD  valACYclovir (VALTREX) 1000 MG tablet Take 500 mg by mouth daily as needed (outbreak).    Historical Provider, MD   BP 101/79 mmHg  Pulse 118  Temp(Src) 97.8 F (36.6 C) (Oral)  Resp 18  SpO2 96% Physical Exam  Constitutional: She is oriented to person, place, and time. She appears well-developed and well-nourished. No distress.  HENT:  Head: Normocephalic and atraumatic.  Eyes: Pupils are equal, round, and reactive to light.  Neck: Normal range of motion.  Cardiovascular: Normal rate and regular rhythm.   Pulmonary/Chest: Effort normal. No respiratory distress.  Musculoskeletal: Normal range of motion.  Neurological: She is alert and oriented to person, place, and time.  Skin: Skin is warm and dry.  Psychiatric:  tearful  Nursing note and vitals reviewed.   ED Course  Procedures  DIAGNOSTIC STUDIES: Oxygen Saturation is 96% on room air, normal by my interpretation.    COORDINATION OF CARE: 4:30 PM Discussed treatment plan with pt at bedside and pt agreed to plan.  Labs Review  Labs Reviewed  BASIC METABOLIC PANEL - Abnormal;  Notable for the following:    CO2 20 (*)    Glucose, Bld 117 (*)    All other components within normal limits  CBC WITH DIFFERENTIAL/PLATELET - Abnormal; Notable for the following:    RBC 5.32 (*)    Hemoglobin 16.9 (*)    HCT 49.6 (*)    Neutrophils Relative % 80 (*)    All other components within normal limits  TROPONIN I    Imaging Review Dg Chest 2 View  04/17/2015   CLINICAL DATA:  Chest pain and dizziness  EXAM: CHEST  2 VIEW  COMPARISON:  Radiographs 05/08/2014  FINDINGS: Normal mediastinum and cardiac silhouette. Normal pulmonary vasculature. No evidence of effusion, infiltrate, or pneumothorax. No acute bony abnormality.  IMPRESSION: No acute cardiopulmonary process.   Electronically Signed   By: Suzy Bouchard M.D.   On: 04/17/2015 17:31     EKG Interpretation   Date/Time:  Saturday Apr 17 2015 16:13:02 EDT Ventricular Rate:  118 PR Interval:  122 QRS Duration: 70 QT Interval:  323 QTC Calculation: 452 R Axis:   75 Text Interpretation:  Sinus tachycardia Biatrial enlargement Minimal ST  depression, diffuse leads Confirmed by Alvino Chapel  MD, Ovid Curd [phone]) on  04/17/2015 4:55:19 PM      MDM   Final diagnoses:  Chest pain  Anxiety    Symptoms have resolved. Likely anxiety related. Pt sent home with a couple of ativan. Given resource guide. No si/hi  I personally performed the services described in this documentation, which was scribed in my presence. The recorded information has been reviewed and is accurate.      Glendell Docker, NP 04/17/15 1758  Davonna Belling, MD 04/18/15 1455

## 2015-04-17 NOTE — ED Notes (Signed)
PER EMS - pt was trying to walk to ER for c/o anxiety but couldn't make it, bystander called 911.  Pt c/o social stressors and inc anxiety recently.  Speaking full/clear sentences, rr even/un-lab.  MAEI.  Ambulatory.

## 2015-06-04 DIAGNOSIS — S62101A Fracture of unspecified carpal bone, right wrist, initial encounter for closed fracture: Secondary | ICD-10-CM

## 2015-06-04 HISTORY — DX: Fracture of unspecified carpal bone, right wrist, initial encounter for closed fracture: S62.101A

## 2015-06-28 DIAGNOSIS — T148XXA Other injury of unspecified body region, initial encounter: Secondary | ICD-10-CM | POA: Insufficient documentation

## 2015-07-05 DIAGNOSIS — S82891A Other fracture of right lower leg, initial encounter for closed fracture: Secondary | ICD-10-CM

## 2015-07-05 DIAGNOSIS — S82892A Other fracture of left lower leg, initial encounter for closed fracture: Secondary | ICD-10-CM

## 2015-07-05 HISTORY — DX: Other fracture of right lower leg, initial encounter for closed fracture: S82.891A

## 2015-07-05 HISTORY — DX: Other fracture of left lower leg, initial encounter for closed fracture: S82.892A

## 2015-07-08 ENCOUNTER — Emergency Department (HOSPITAL_COMMUNITY)
Admission: EM | Admit: 2015-07-08 | Discharge: 2015-07-08 | Disposition: A | Discharge status: Home / Self Care | Payer: Self-pay | Attending: Emergency Medicine | Admitting: Emergency Medicine

## 2015-07-08 ENCOUNTER — Encounter (HOSPITAL_COMMUNITY): Payer: Self-pay

## 2015-07-08 ENCOUNTER — Emergency Department (HOSPITAL_COMMUNITY): Payer: Self-pay

## 2015-07-08 DIAGNOSIS — F419 Anxiety disorder, unspecified: Secondary | ICD-10-CM | POA: Insufficient documentation

## 2015-07-08 DIAGNOSIS — M1990 Unspecified osteoarthritis, unspecified site: Secondary | ICD-10-CM | POA: Insufficient documentation

## 2015-07-08 DIAGNOSIS — S52501A Unspecified fracture of the lower end of right radius, initial encounter for closed fracture: Secondary | ICD-10-CM | POA: Insufficient documentation

## 2015-07-08 DIAGNOSIS — Z79899 Other long term (current) drug therapy: Secondary | ICD-10-CM | POA: Insufficient documentation

## 2015-07-08 DIAGNOSIS — Z8679 Personal history of other diseases of the circulatory system: Secondary | ICD-10-CM | POA: Insufficient documentation

## 2015-07-08 DIAGNOSIS — Y929 Unspecified place or not applicable: Secondary | ICD-10-CM | POA: Insufficient documentation

## 2015-07-08 DIAGNOSIS — S62101A Fracture of unspecified carpal bone, right wrist, initial encounter for closed fracture: Secondary | ICD-10-CM

## 2015-07-08 DIAGNOSIS — Z87891 Personal history of nicotine dependence: Secondary | ICD-10-CM | POA: Insufficient documentation

## 2015-07-08 DIAGNOSIS — Y939 Activity, unspecified: Secondary | ICD-10-CM | POA: Insufficient documentation

## 2015-07-08 DIAGNOSIS — F329 Major depressive disorder, single episode, unspecified: Secondary | ICD-10-CM | POA: Insufficient documentation

## 2015-07-08 DIAGNOSIS — M797 Fibromyalgia: Secondary | ICD-10-CM | POA: Insufficient documentation

## 2015-07-08 DIAGNOSIS — W010XXA Fall on same level from slipping, tripping and stumbling without subsequent striking against object, initial encounter: Secondary | ICD-10-CM | POA: Insufficient documentation

## 2015-07-08 DIAGNOSIS — E669 Obesity, unspecified: Secondary | ICD-10-CM | POA: Insufficient documentation

## 2015-07-08 DIAGNOSIS — Y999 Unspecified external cause status: Secondary | ICD-10-CM | POA: Insufficient documentation

## 2015-07-08 MED ORDER — IBUPROFEN 600 MG PO TABS: 600.0000 mg | ORAL_TABLET | Freq: Four times a day (QID) | ORAL | Status: DC | PRN

## 2015-07-08 NOTE — Discharge Instructions (Signed)
Keep hand elevated. Ice several times a day. Ibuprofen for pain. Follow up with hand specialist.    Wrist Pain Wrist injuries are frequent in adults and children. A sprain is an injury to the ligaments that hold your bones together. A strain is an injury to muscle or muscle cord-like structures (tendons) from stretching or pulling. Generally, when wrists are moderately tender to touch following a fall or injury, a break in the bone (fracture) may be present. Most wrist sprains or strains are better in 3 to 5 days, but complete healing may take several weeks. HOME CARE INSTRUCTIONS   Put ice on the injured area.  Put ice in a plastic bag.  Place a towel between your skin and the bag.  Leave the ice on for 15-20 minutes, 3-4 times a day, for the first 2 days, or as directed by your health care provider.  Keep your arm raised above the level of your heart whenever possible to reduce swelling and pain.  Rest the injured area for at least 48 hours or as directed by your health care provider.  If a splint or elastic bandage has been applied, use it for as long as directed by your health care provider or until seen by a health care provider for a follow-up exam.  Only take over-the-counter or prescription medicines for pain, discomfort, or fever as directed by your health care provider.  Keep all follow-up appointments. You may need to follow up with a specialist or have follow-up X-rays. Improvement in pain level is not a guarantee that you did not fracture a bone in your wrist. The only way to determine whether or not you have a broken bone is by X-ray. SEEK IMMEDIATE MEDICAL CARE IF:   Your fingers are swollen, very red, white, or cold and blue.  Your fingers are numb or tingling.  You have increasing pain.  You have difficulty moving your fingers. MAKE SURE YOU:   Understand these instructions.  Will watch your condition.  Will get help right away if you are not doing well or get  worse. Document Released: 08/30/2005 Document Revised: 11/25/2013 Document Reviewed: 01/11/2011 [facility] Patient Information 2015 [place], [place]. This information is not intended to replace advice given to you by your health care provider. Make sure you discuss any questions you have with your health care provider.
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
## 2015-07-08 NOTE — ED Notes (Signed)
Pt c/o R wrist injury after a slip and fall x 10 days ago.  Pain score 8/10.  Pt reported limited ROM.  Sts she has been taking ibuprofen w/ some relief.

## 2015-07-13 ENCOUNTER — Emergency Department (HOSPITAL_COMMUNITY): Payer: Self-pay

## 2015-07-13 ENCOUNTER — Observation Stay (HOSPITAL_COMMUNITY)
Admission: EM | Admit: 2015-07-13 | Discharge: 2015-07-14 | Disposition: A | Discharge status: Home / Self Care | Payer: Self-pay | Attending: Internal Medicine | Admitting: Internal Medicine

## 2015-07-13 ENCOUNTER — Encounter (HOSPITAL_COMMUNITY): Payer: Self-pay | Admitting: Emergency Medicine

## 2015-07-13 ENCOUNTER — Emergency Department (HOSPITAL_COMMUNITY)
Admission: EM | Admit: 2015-07-13 | Discharge: 2015-07-13 | Disposition: A | Discharge status: Home / Self Care | Payer: Self-pay | Source: Home / Self Care | Attending: Emergency Medicine | Admitting: Emergency Medicine

## 2015-07-13 DIAGNOSIS — S5291XA Unspecified fracture of right forearm, initial encounter for closed fracture: Secondary | ICD-10-CM

## 2015-07-13 DIAGNOSIS — S82402A Unspecified fracture of shaft of left fibula, initial encounter for closed fracture: Secondary | ICD-10-CM

## 2015-07-13 DIAGNOSIS — Z87891 Personal history of nicotine dependence: Secondary | ICD-10-CM | POA: Insufficient documentation

## 2015-07-13 DIAGNOSIS — S82832A Other fracture of upper and lower end of left fibula, initial encounter for closed fracture: Principal | ICD-10-CM | POA: Insufficient documentation

## 2015-07-13 DIAGNOSIS — S82831A Other fracture of upper and lower end of right fibula, initial encounter for closed fracture: Secondary | ICD-10-CM | POA: Insufficient documentation

## 2015-07-13 DIAGNOSIS — F329 Major depressive disorder, single episode, unspecified: Secondary | ICD-10-CM | POA: Insufficient documentation

## 2015-07-13 DIAGNOSIS — S5291XD Unspecified fracture of right forearm, subsequent encounter for closed fracture with routine healing: Secondary | ICD-10-CM

## 2015-07-13 DIAGNOSIS — S82401A Unspecified fracture of shaft of right fibula, initial encounter for closed fracture: Secondary | ICD-10-CM | POA: Diagnosis present

## 2015-07-13 DIAGNOSIS — S62101D Fracture of unspecified carpal bone, right wrist, subsequent encounter for fracture with routine healing: Secondary | ICD-10-CM | POA: Insufficient documentation

## 2015-07-13 DIAGNOSIS — M797 Fibromyalgia: Secondary | ICD-10-CM | POA: Insufficient documentation

## 2015-07-13 DIAGNOSIS — X58XXXD Exposure to other specified factors, subsequent encounter: Secondary | ICD-10-CM | POA: Insufficient documentation

## 2015-07-13 DIAGNOSIS — W010XXA Fall on same level from slipping, tripping and stumbling without subsequent striking against object, initial encounter: Secondary | ICD-10-CM | POA: Insufficient documentation

## 2015-07-13 DIAGNOSIS — F419 Anxiety disorder, unspecified: Secondary | ICD-10-CM | POA: Insufficient documentation

## 2015-07-13 DIAGNOSIS — Z791 Long term (current) use of non-steroidal anti-inflammatories (NSAID): Secondary | ICD-10-CM | POA: Insufficient documentation

## 2015-07-13 LAB — BASIC METABOLIC PANEL
Anion gap: 9 (ref 5–15)
BUN: 7 mg/dL (ref 6–20)
CALCIUM: 9.1 mg/dL (ref 8.9–10.3)
CO2: 22 mmol/L (ref 22–32)
Chloride: 108 mmol/L (ref 101–111)
Creatinine, Ser: 0.8 mg/dL (ref 0.44–1.00)
GFR calc Af Amer: >60 mL/min (ref >60)
GFR calc non Af Amer: >60 mL/min (ref >60)
Glucose, Bld: 90 mg/dL (ref 65–99)
POTASSIUM: 3.6 mmol/L (ref 3.5–5.1)
Sodium: 139 mmol/L (ref 135–145)

## 2015-07-13 LAB — CBC WITH DIFFERENTIAL/PLATELET
Basophils Absolute: 0 10*3/uL (ref 0.0–0.1)
Basophils Relative: 0 % (ref 0–1)
EOS PCT: 1 % (ref 0–5)
Eosinophils Absolute: 0 10*3/uL (ref 0.0–0.7)
HCT: 38.2 % (ref 36.0–46.0)
Hemoglobin: 13.2 g/dL (ref 12.0–15.0)
LYMPHS ABS: 1.1 10*3/uL (ref 0.7–4.0)
LYMPHS PCT: 18 % (ref 12–46)
MCH: 32 pg (ref 26.0–34.0)
MCHC: 34.6 g/dL (ref 30.0–36.0)
MCV: 92.7 fL (ref 78.0–100.0)
MONOS PCT: 6 % (ref 3–12)
Monocytes Absolute: 0.4 10*3/uL (ref 0.1–1.0)
Neutro Abs: 4.7 10*3/uL (ref 1.7–7.7)
Neutrophils Relative %: 75 % (ref 43–77)
Platelets: 278 10*3/uL (ref 150–400)
RBC: 4.12 MIL/uL (ref 3.87–5.11)
RDW: 14 % (ref 11.5–15.5)
WBC: 6.2 10*3/uL (ref 4.0–10.5)

## 2015-07-13 MED ORDER — ONDANSETRON HCL 4 MG PO TABS
4.0000 mg | ORAL_TABLET | Freq: Four times a day (QID) | ORAL | Status: DC | PRN
  Administered 2015-07-14: 4 mg via ORAL
  Filled 2015-07-13: qty 1

## 2015-07-13 MED ORDER — ACETAMINOPHEN 650 MG RE SUPP: 650.0000 mg | Freq: Four times a day (QID) | RECTAL | Status: DC | PRN

## 2015-07-13 MED ORDER — HYDROMORPHONE HCL 1 MG/ML IJ SOLN
0.5000 mg | INTRAMUSCULAR | Status: DC | PRN
  Administered 2015-07-13: 0.5 mg via INTRAVENOUS
  Filled 2015-07-13: qty 1

## 2015-07-13 MED ORDER — ONDANSETRON HCL 4 MG/2ML IJ SOLN: 4.0000 mg | Freq: Three times a day (TID) | INTRAMUSCULAR | Status: DC | PRN

## 2015-07-13 MED ORDER — ACETAMINOPHEN 325 MG PO TABS
650.0000 mg | ORAL_TABLET | Freq: Four times a day (QID) | ORAL | Status: DC | PRN
  Administered 2015-07-14 (×2): 650 mg via ORAL
  Filled 2015-07-13 (×2): qty 2

## 2015-07-13 MED ORDER — HYDROMORPHONE HCL 1 MG/ML IJ SOLN
1.0000 mg | Freq: Once | INTRAMUSCULAR | Status: AC
  Administered 2015-07-13: 1 mg via INTRAVENOUS
  Filled 2015-07-13: qty 1

## 2015-07-13 MED ORDER — LORATADINE 10 MG PO TABS
10.0000 mg | ORAL_TABLET | Freq: Every day | ORAL | Status: DC
  Administered 2015-07-14: 10 mg via ORAL
  Filled 2015-07-13: qty 1

## 2015-07-13 MED ORDER — ONDANSETRON HCL 4 MG/2ML IJ SOLN: 4.0000 mg | Freq: Four times a day (QID) | INTRAMUSCULAR | Status: DC | PRN

## 2015-07-13 MED ORDER — SODIUM CHLORIDE 0.9 % IV SOLN
INTRAVENOUS | Status: DC
Start: 2015-07-13 — End: 2015-07-14
  Administered 2015-07-13: via INTRAVENOUS

## 2015-07-13 MED ORDER — HYDROMORPHONE HCL 1 MG/ML IJ SOLN: 1.0000 mg | INTRAMUSCULAR | Status: DC | PRN

## 2015-07-13 MED ORDER — ONDANSETRON HCL 4 MG/2ML IJ SOLN
4.0000 mg | Freq: Once | INTRAMUSCULAR | Status: AC
  Administered 2015-07-13: 4 mg via INTRAVENOUS
  Filled 2015-07-13: qty 2

## 2015-07-13 MED ORDER — ENOXAPARIN SODIUM 40 MG/0.4ML ~~LOC~~ SOLN
40.0000 mg | Freq: Every day | SUBCUTANEOUS | Status: DC
  Administered 2015-07-13: 40 mg via SUBCUTANEOUS
  Filled 2015-07-13: qty 0.4

## 2015-07-13 MED ORDER — MORPHINE SULFATE 4 MG/ML IJ SOLN
4.0000 mg | Freq: Once | INTRAMUSCULAR | Status: AC
  Administered 2015-07-13: 4 mg via INTRAVENOUS
  Filled 2015-07-13: qty 1

## 2015-07-13 NOTE — H&P (Signed)
[facility] History and Physical  April [name] GSU:[id number] DOB: 1961-10-19 DOA: 07/13/2015  Referring physician: ER physician. PCP: April Hummingbird, PA-C  Specialists: None.  Chief Complaint: Fracture of the both lower extremities.  HPI: April [name] is a 54 y.o. female history of fibromyalgia and anxiety presently on no medications presents to the ER after patient sustained a fall and x-rays revealed bilateral lower extremity fibular fractures. At this time patient finds it difficult to go home and will be admitted for observation. Patient had a fall one week ago and had sustained right radial fracture and had gone to urgent care today to get splint placed. Patient had difficulty getting orthopedic office consult. While on the way back patient fell and broke both of fibula. On-call orthopedic surgeon Dr. Marlou Sa was consulted by the ER physician. At this time patient will be admitted for observation and social work help an orthopedic consult. Patient denies any chest pain short of breath or loss of consciousness or palpitations. Patient states she fell after tripping.   Review of Systems: As presented in the history of presenting illness, rest negative.  Past Medical History  Diagnosis Date  . Obesity   . Migraine   . Fibromyalgia 2013    diagnosed by Dr. Estanislado [name]  . Allergy   . Arthritis   . Depression   . Anxiety    History reviewed. No pertinent past surgical history. Social History:  reports that she quit smoking about 21 years ago. She has never used smokeless tobacco. She reports that she drinks alcohol. She reports that she does not use illicit drugs. Where does patient live home. Can patient participate in ADLs? Yes.  Allergies  Allergen Reactions  . Hydrocodone Nausea Only and Other (See Comments)    dizziness    Family History:  Family History  Problem Relation Age of Onset  . Alcohol abuse Mother   . Arthritis Mother   . Hypertension Mother   . Heart  disease Mother   . Dementia Mother   . Irritable bowel syndrome Mother   . Emphysema Father   . Cancer Father     bladder  . Bipolar disorder Daughter   . Depression Daughter   . Graves' disease Sister       Prior to Admission medications   Medication Sig Start Date End Date Taking? Authorizing Provider  cetirizine (ZYRTEC) 10 MG tablet Take 10 mg by mouth daily.   Yes Historical Provider, MD  ibuprofen (ADVIL,MOTRIN) 600 MG tablet Take 1 tablet (600 mg total) by mouth every 6 (six) hours as needed. Patient taking differently: Take 600 mg by mouth every 6 (six) hours as needed (pain).  07/08/15  Yes April Kirichenko, PA-C  LORazepam (ATIVAN) 1 MG tablet Take 0.5 tablets (0.5 mg total) by mouth every 8 (eight) hours as needed for anxiety. Patient not taking: Reported on 07/13/2015 04/17/15   April Docker, NP    Physical Exam: Filed Vitals:   07/13/15 2000 07/13/15 2130 07/13/15 2200 07/13/15 2230  BP: 150/92 133/106 141/79 133/71  Pulse: 67 81 69 85  Temp:      TempSrc:      Resp:      Height:      Weight:      SpO2: 99% 99% 97% 100%     General:  Moderately built and nourished.  Eyes: Anicteric no pallor.  ENT: No discharge from the ears eyes nose or mouth.  Neck: No mass felt.  Cardiovascular: S1 and S2 heard.  Respiratory: No rhonchi or crepitations.  Abdomen: Soft nontender bowel sounds present.  Skin: No rash.  Musculoskeletal: Patient has cast on her right hand and brace on the left lower extremity.  Psychiatric: Appears normal.  Neurologic: Alert awake oriented to time place and person. Moves all extremities.  Labs on Admission:  Basic Metabolic Panel:  Recent Labs Lab 07/13/15 2104  NA 139  K 3.6  CL 108  CO2 22  GLUCOSE 90  BUN 7  CREATININE 0.80  CALCIUM 9.1   Liver Function Tests: No results for input(s): AST, ALT, ALKPHOS, BILITOT, PROT, ALBUMIN in the last 168 hours. No results for input(s): LIPASE, AMYLASE in the last 168  hours. No results for input(s): AMMONIA in the last 168 hours. CBC:  Recent Labs Lab 07/13/15 2104  WBC 6.2  NEUTROABS 4.7  HGB 13.2  HCT 38.2  MCV 92.7  PLT 278   Cardiac Enzymes: No results for input(s): CKTOTAL, CKMB, CKMBINDEX, TROPONINI in the last 168 hours.  BNP (last 3 results) No results for input(s): BNP in the last 8760 hours.  ProBNP (last 3 results) No results for input(s): PROBNP in the last 8760 hours.  CBG: No results for input(s): GLUCAP in the last 168 hours.  Radiological Exams on Admission: Dg Ankle Complete Left  07/13/2015   CLINICAL DATA:  Pain following fall into hole  EXAM: LEFT ANKLE COMPLETE - 3+ VIEW  COMPARISON:  None.  FINDINGS: Frontal, oblique, and lateral views were obtained. There is soft tissue swelling. There is an obliquely oriented fracture of the distal fibular diaphysis in near anatomic alignment. No other fracture. No appreciable joint effusion. The ankle mortise appears intact. There is a small spur arising from the inferior calcaneus.  IMPRESSION: Obliquely oriented fracture distal fibular diaphysis in near anatomic alignment. Soft tissue swelling. Ankle mortise appears intact.   Electronically Signed   By: Lowella Grip III M.D.   On: 07/13/2015 20:32   Dg Ankle Complete Right  07/13/2015   CLINICAL DATA:  Pain following fall into hole  EXAM: RIGHT ANKLE - COMPLETE 3+ VIEW  COMPARISON:  None.  FINDINGS: Frontal, oblique, and lateral views were obtained. There is a transversely oriented fracture of the distal fibular metaphysis in near anatomic alignment. No other fracture appreciable. No joint effusion. The ankle mortise appears intact. There is a spur arising from the inferior calcaneus.  IMPRESSION: Transversely oriented fracture distal fibular metaphysis in near anatomic alignment. Ankle mortise appears intact. Small inferior calcaneal spur.   Electronically Signed   By: Lowella Grip III M.D.   On: 07/13/2015 20:33     Assessment/Plan Principal Problem:   Bilateral fibular fractures   1. Bilateral lower extremity. No fracture after mechanical fall with inability to walk with a right radial fracture recently - orthopedic surgeon to see in a.m. Social work consult for help with discharge. Continued pain relief medications. Physical therapy consult. 2. History of fibromyalgia and anxiety present on no medications.  I have reviewed the old charts and labs. Personally reviewed x-rays.  DVT ProphylaxisLovenox.  Code Status: Full code.  Family Communication: Discussed with patient.  Disposition Plan: Admit for observation.    Harwood Nall N. [facility] Pager [phone].  If 7PM-7AM, please contact night-coverage [email or web address] Password [email or web address] 07/13/2015, 11:13 PM

## 2015-07-13 NOTE — ED Provider Notes (Signed)
CSN: [id number]     Arrival date & time 07/13/15  1437 History   First MD Initiated Contact with Patient 07/13/15 1743     Chief Complaint  Patient presents with  . Cast Problem   (Consider location/radiation/quality/duration/timing/severity/associated sxs/prior Treatment) HPI Comments: 54 year old female presents to the urgent care requesting [name] to place a cast on her right wrist, forearm. She was seen at [facility] on August 4  for treatment of a fracture of the right distal radius. She had fallen 10 days prior to going to the hospital. This was a nondisplaced and slightly impacted fracture. A splint was applied and she will be referred to an orthopedic surgeon. She is unable to afford to see an orthopedic surgeon. She has no complaints today. She is having no pain or swelling.   Past Medical History  Diagnosis Date  . Obesity   . Migraine   . Fibromyalgia 2013    diagnosed by Dr. [name]  . Allergy   . Arthritis   . Depression   . Anxiety    History reviewed. No pertinent past surgical history. Family History  Problem Relation Age of Onset  . Alcohol abuse Mother   . Arthritis Mother   . Hypertension Mother   . Heart disease Mother   . Dementia Mother   . Irritable bowel syndrome Mother   . Emphysema Father   . Cancer Father     bladder  . Bipolar disorder Daughter   . Depression Daughter   . Graves' disease Sister    History  Substance Use Topics  . Smoking status: Former Smoker    Quit date: 01/21/1994  . Smokeless tobacco: Never Used  . Alcohol Use: 0.0 oz/week    2-4 Standard drinks or equivalent per week   OB History    No data available     Review of Systems  Constitutional: Negative for fever, activity change and fatigue.  HENT: Negative.   Respiratory: Negative.   Musculoskeletal:       As per history of present illness  Skin: Negative.   Neurological: Negative.     Allergies  Hydrocodone  Home Medications   Prior to Admission  medications   Medication Sig Start Date End Date Taking? Authorizing Provider  ibuprofen (ADVIL,MOTRIN) 600 MG tablet Take 1 tablet (600 mg total) by mouth every 6 (six) hours as needed. 07/08/15   [name], PA-C  loratadine (CLARITIN) 10 MG tablet Take 10 mg by mouth daily.    Historical Provider, MD  LORazepam (ATIVAN) 1 MG tablet Take 0.5 tablets (0.5 mg total) by mouth every 8 (eight) hours as needed for anxiety. 04/17/15   Glendell Docker, NP  valACYclovir (VALTREX) 1000 MG tablet Take 500 mg by mouth daily as needed (outbreak).    Historical Provider, MD   BP 156/75 mmHg  Pulse 63  Temp(Src) 97.8 F (36.6 C) (Oral)  Resp 16  SpO2 96% Physical Exam  Constitutional: She appears well-developed and well-nourished.  Neurological: She is alert. She exhibits normal muscle tone.  Skin: Skin is warm and dry.  Right upper extremity with normal color and movement of the digits. Normal warmth. Full range of motion of the digits. Normal neurovascular and motor sensory to the hand and digits. The forearm and wrist splint is well placed and remains intact.  Nursing note and vitals reviewed.   ED Course  Procedures (including critical care time) Labs Review Labs Reviewed - No data to display  Imaging Review No results  found.   MDM   1. Radius fracture, right, closed, with routine healing, subsequent encounter    The original splint is well maintained. There is no sign of impaired circulation to the right upper extremity. The patient will keep the splint on for now. She has been given Maggie's card to help with obtaining a physician/orthopedist for follow-up care. Keep upper extremity elevated.    [name], NP 07/13/15 [phone]

## 2015-07-13 NOTE — ED Provider Notes (Signed)
CSN: [id number]     Arrival date & time 07/13/15  1902 History   First MD Initiated Contact with Patient 07/13/15 1914     Chief Complaint  Patient presents with  . Ankle Injury    bilater ankle pain     (Consider location/radiation/quality/duration/timing/severity/associated sxs/prior Treatment) HPI  The patient presents to the ER by EMS after a fall. She sustained a radial fracture to the right wrist on 06/28/2015 after falling in the rain and was unable to follow-up with Ortho due to financial issues. She went to the UC today to ask for a cast for her wrist butt they were unable to do so. While hurrying home by foot she accidentally stepped in a large whole and heard both of her ankles make a cracking sound. She was stuck on the ground and unable tos tand up due to the pain. No head injury, loc, or neck injury. Denies any other complaints aside from pain.  PCP: Windell Hummingbird, PA-C Blood pressure 150/92, pulse 67, temperature 98.4 F (36.9 C), temperature source Oral, resp. rate 24, height 5\' 3"  (1.6 m), weight 200 lb (90.719 kg), SpO2 99 %.  The patient denies diaphoresis, fever, headache, weakness (general or focal), confusion, change of vision,  neck pain, dysphagia, aphagia, chest pain, shortness of breath,  back pain, abdominal pains, nausea, vomiting, diarrhea, rash.  Past Medical History  Diagnosis Date  . Obesity   . Migraine   . Fibromyalgia 2013    diagnosed by Dr. [name]  . Allergy   . Arthritis   . Depression   . Anxiety    History reviewed. No pertinent past surgical history. Family History  Problem Relation Age of Onset  . Alcohol abuse Mother   . Arthritis Mother   . Hypertension Mother   . Heart disease Mother   . Dementia Mother   . Irritable bowel syndrome Mother   . Emphysema Father   . Cancer Father     bladder  . Bipolar disorder Daughter   . Depression Daughter   . Graves' disease Sister    History  Substance Use Topics  . Smoking status:  Former Smoker    Quit date: 01/21/1994  . Smokeless tobacco: Never Used  . Alcohol Use: 0.0 oz/week    2-4 Standard drinks or equivalent per week   OB History    No data available     Review of Systems  10 Systems reviewed and are negative for acute change except as noted in the HPI.  Allergies  Hydrocodone  Home Medications   Prior to Admission medications   Medication Sig Start Date End Date Taking? Authorizing Provider  ibuprofen (ADVIL,MOTRIN) 600 MG tablet Take 1 tablet (600 mg total) by mouth every 6 (six) hours as needed. 07/08/15   [name], PA-C  loratadine (CLARITIN) 10 MG tablet Take 10 mg by mouth daily.    Historical Provider, MD  LORazepam (ATIVAN) 1 MG tablet Take 0.5 tablets (0.5 mg total) by mouth every 8 (eight) hours as needed for anxiety. 04/17/15   Glendell Docker, NP  valACYclovir (VALTREX) 1000 MG tablet Take 500 mg by mouth daily as needed (outbreak).    Historical Provider, MD   BP 150/92 mmHg  Pulse 67  Temp(Src) 98.4 F (36.9 C) (Oral)  Resp 24  Ht 5\' 3"  (1.6 m)  Wt 200 lb (90.719 kg)  BMI 35.44 kg/m2  SpO2 99% Physical Exam  Constitutional: She appears well-developed and well-nourished. No distress.  HENT:  Head: Normocephalic  and atraumatic.  Eyes: Pupils are equal, round, and reactive to light.  Neck: Normal range of motion. Neck supple. No spinous process tenderness and no muscular tenderness present.  Cardiovascular: Normal rate and regular rhythm.   Pulmonary/Chest: Effort normal.  Abdominal: Soft.  Musculoskeletal:  bi alteral decreased FROM to ankles. Swelling, ecchymosis and pain to bilateral ankles worse to the lateral malleoli. No gross deformities. Intact pedal pulses and sensations.  Neurological: She is alert.  Skin: Skin is warm and dry.  Nursing note and vitals reviewed.   ED Course  Procedures (including critical care time) Labs Review Labs Reviewed  CBC WITH DIFFERENTIAL/PLATELET  BASIC METABOLIC PANEL     Imaging Review Dg Ankle Complete Left  07/13/2015   CLINICAL DATA:  Pain following fall into hole  EXAM: LEFT ANKLE COMPLETE - 3+ VIEW  COMPARISON:  None.  FINDINGS: Frontal, oblique, and lateral views were obtained. There is soft tissue swelling. There is an obliquely oriented fracture of the distal fibular diaphysis in near anatomic alignment. No other fracture. No appreciable joint effusion. The ankle mortise appears intact. There is a small spur arising from the inferior calcaneus.  IMPRESSION: Obliquely oriented fracture distal fibular diaphysis in near anatomic alignment. Soft tissue swelling. Ankle mortise appears intact.   Electronically Signed   By: Lowella Grip III M.D.   On: 07/13/2015 20:32   Dg Ankle Complete Right  07/13/2015   CLINICAL DATA:  Pain following fall into hole  EXAM: RIGHT ANKLE - COMPLETE 3+ VIEW  COMPARISON:  None.  FINDINGS: Frontal, oblique, and lateral views were obtained. There is a transversely oriented fracture of the distal fibular metaphysis in near anatomic alignment. No other fracture appreciable. No joint effusion. The ankle mortise appears intact. There is a spur arising from the inferior calcaneus.  IMPRESSION: Transversely oriented fracture distal fibular metaphysis in near anatomic alignment. Ankle mortise appears intact. Small inferior calcaneal spur.   Electronically Signed   By: Lowella Grip III M.D.   On: 07/13/2015 20:33     EKG Interpretation None      MDM   Final diagnoses:  Radial fracture, right, closed, initial encounter  Closed fibular fracture, left, initial encounter  Closed fibular fracture, right, initial encounter    Patient has sustained bilateral fibular fractures to her ankles. She also has the radial fracture to the right wrist. The patient lives at home and is concerned about mobility. We spoke with Case Manager and they are unable to obtain a wheel chair tonight. I spoke with on call ortho with [facility]  and they have agreed to see the patient in the hospital tomorrow.  Patient to be admitted overnight for pain control, ortho consult in the morning, and for a wheel chair. Dr. Wyvonnia Dusky agreeable to plan and aware of admission.  MC admits, obs status,    Delos Haring, Hershal Coria 07/13/15 2137  Ezequiel Essex, MD 07/14/15 [phone]

## 2015-07-13 NOTE — Discharge Instructions (Signed)

## 2015-07-13 NOTE — Progress Notes (Signed)
Pt. Arrived from ED with pain in ankles and wrist with VSS. Will administer pain medicine. Continue to monitor. Joaquin Bend E, [place] 07/13/2015 2310

## 2015-07-13 NOTE — ED Notes (Signed)
Per [facility] pt fell walking home from urgent care. Pt tripped in a big hole. Pt rolled both ankles and reports feeling crunchy in both ankles.Pt observed laying on the ground by a passerby who notified the [employer].   Recent fall and right wrist fracture.   148/108 110 20 98%  Sensitive to touch both ankles and feet

## 2015-07-13 NOTE — ED Notes (Signed)
Patient was seen in the [place] ed after a fall.  Patient diagnosed with a right wrist fracture and has a splint applied by the ed.  Patient was given follow up information for orthopedic office.  Patient unable to pay fee to see orthopedic specialist.  Patient here requesting that a cast is applied, to replace splint.

## 2015-07-14 DIAGNOSIS — S82401A Unspecified fracture of shaft of right fibula, initial encounter for closed fracture: Secondary | ICD-10-CM

## 2015-07-14 DIAGNOSIS — S82402A Unspecified fracture of shaft of left fibula, initial encounter for closed fracture: Secondary | ICD-10-CM

## 2015-07-14 LAB — CBC
HCT: 35.4 % — ABNORMAL LOW (ref 36.0–46.0)
Hemoglobin: 12 g/dL (ref 12.0–15.0)
MCH: 32.2 pg (ref 26.0–34.0)
MCHC: 33.9 g/dL (ref 30.0–36.0)
MCV: 94.9 fL (ref 78.0–100.0)
PLATELETS: 233 10*3/uL (ref 150–400)
RBC: 3.73 MIL/uL — ABNORMAL LOW (ref 3.87–5.11)
RDW: 14.1 % (ref 11.5–15.5)
WBC: 6.1 10*3/uL (ref 4.0–10.5)

## 2015-07-14 LAB — BASIC METABOLIC PANEL
Anion gap: 8 (ref 5–15)
BUN: 8 mg/dL (ref 6–20)
CALCIUM: 8.6 mg/dL — AB (ref 8.9–10.3)
CO2: 24 mmol/L (ref 22–32)
Chloride: 108 mmol/L (ref 101–111)
Creatinine, Ser: 0.79 mg/dL (ref 0.44–1.00)
GFR calc Af Amer: >60 mL/min (ref >60)
Glucose, Bld: 110 mg/dL — ABNORMAL HIGH (ref 65–99)
POTASSIUM: 3.6 mmol/L (ref 3.5–5.1)
SODIUM: 140 mmol/L (ref 135–145)

## 2015-07-14 MED ORDER — APIXABAN 2.5 MG PO TABS: 2.5000 mg | ORAL_TABLET | Freq: Two times a day (BID) | ORAL | Status: DC

## 2015-07-14 MED ORDER — MORPHINE SULFATE 15 MG PO TABS
15.0000 mg | ORAL_TABLET | ORAL | Status: DC | PRN
  Administered 2015-07-14: 15 mg via ORAL
  Filled 2015-07-14: qty 1

## 2015-07-14 MED ORDER — ONDANSETRON HCL 4 MG PO TABS: 4.0000 mg | ORAL_TABLET | Freq: Four times a day (QID) | ORAL | Status: DC | PRN

## 2015-07-14 MED ORDER — MORPHINE SULFATE 15 MG PO TABS: 15.0000 mg | ORAL_TABLET | ORAL | Status: DC | PRN

## 2015-07-14 NOTE — Progress Notes (Signed)
Patient is currently listed as self-pay. Patient is being followed by Suanne Marker in [facility].  CM will continue to follow for any additional needs at discharge.

## 2015-07-14 NOTE — Progress Notes (Signed)
D/C orders received, pt for D/C home today.  IV and telemetry D/C.  Rx and D/C instructions given with verbalized understanding.  Family at bedside to assist with D/C.  Staff brought pt downstairs via wheelchair.  

## 2015-07-14 NOTE — Care Management Note (Signed)
Case Management Note  Patient Details  Name: April [name] MRN: [id number] Date of Birth: January 15, 1961  Subjective/Objective:                    Action/Plan: Met with patient to discuss discharge planning. Patient states that she will need to be discharged home, as she is the sole caregiver of her mother with dementia. Patient reports that her estranged husband may be available to provide some assistance at discharge, but she is concerned that he may not be as available as discussed.  Patient is currently uninsured, thus will only qualify for [facility] based on her diagnosis. Patient is aware of this. She was provided with a private duty agency list in the event that she would need additional help.  Miranda with [facility] was notified and has accepted the referral for discharge home today.  [facility] was notified of need for equipment prior to discharge.  Patient was provided with an Eliquis 30 day free card as well as a Systems developer.  Patient states that she DOES have transportation home.  CM discussed obtaining a PCP. Patient states that she has a provider, but she has not seen her since 2014. Patient's PCP is at the [facility].  They do accept uninsured patients and she intends to follow with them.  Patient was provided with the list of local indigent clinics.  Expected Discharge Date:                  Expected Discharge Plan:  [place]  In-House Referral:     Discharge planning Services  CM Consult, Medication Assistance, [facility]  Post Acute Care Choice:  Durable Medical Equipment, Home Health Choice offered to:  Patient  DME Arranged:  3-N-1, Wheelchair manual, Other see comment (slide board) DME Agency:  [place]:  RN [place] Agency:  [place]  Status of Service:  Completed, signed off  Medicare Important Message Given:    Date Medicare IM Given:    Medicare IM give by:    Date Additional  Medicare IM Given:    Additional Medicare Important Message give by:     If discussed at [place] of Stay Meetings, dates discussed:    Additional CommentsRolm Baptise, RN 07/14/2015, 3:04 PM [phone]

## 2015-07-14 NOTE — Consult Note (Signed)
Reason for Consult: Wrist and ankle pain Referring Physician: Dr. Harlan [name] is an 54 y.o. female.  HPI: April [name] is a 54 year old right-hand-dominant female who sustained a wrist fracture 06/28/2015. She met April grass for 3 days after April injury. She went to April urgent care on August 4 for radiographs which demonstrated April nondisplaced distal radius fracture. She was placed in a splint but wanted to get a cast placed. Yesterday she walked to April urgent care to get a cast that they would not place one she walked back and then injured both legs in front of April fire station. She is currently evaluated in April emergency room and admitted for bilateral lower extremity ankle fractures lateral malleolus nondisplaced on both sides. No family history of DVT or pulmonary embolism. Patient stays at home and takes care of her mother who has Alzheimer's. She is not a smoker. She is separated from her husband and may not have great social support for April period of nonweightbearing which she is going to have to undergo.  Past Medical History  Diagnosis Date  . Obesity   . Migraine   . Fibromyalgia 2013    diagnosed by Dr. [name]  . Allergy   . Arthritis   . Depression   . Anxiety     History reviewed. No pertinent past surgical history.  Family History  Problem Relation Age of Onset  . Alcohol abuse Mother   . Arthritis Mother   . Hypertension Mother   . Heart disease Mother   . Dementia Mother   . Irritable bowel syndrome Mother   . Emphysema Father   . Cancer Father     bladder  . Bipolar disorder Daughter   . Depression Daughter   . Graves' disease Sister     Social History:  reports that she quit smoking about 21 years ago. She has never used smokeless tobacco. She reports that she drinks alcohol. She reports that she does not use illicit drugs.  Allergies:  Allergies  Allergen Reactions  . Hydrocodone Nausea Only and Other (See Comments)    dizziness    Medications: I  have reviewed April patient's current medications.  Results for orders placed or performed during April hospital encounter of 07/13/15 (from April past 48 hour(s))  CBC with Differential/Platelet     Status: None   Collection Time: 07/13/15  9:04 PM  Result Value Ref Range   WBC 6.2 4.0 - 10.5 K/uL   RBC 4.12 3.87 - 5.11 MIL/uL   Hemoglobin 13.2 12.0 - 15.0 g/dL   HCT 38.2 36.0 - 46.0 %   MCV 92.7 78.0 - 100.0 fL   MCH 32.0 26.0 - 34.0 pg   MCHC 34.6 30.0 - 36.0 g/dL   RDW 14.0 11.5 - 15.5 %   Platelets 278 150 - 400 K/uL   Neutrophils Relative % 75 43 - 77 %   Neutro Abs 4.7 1.7 - 7.7 K/uL   Lymphocytes Relative 18 12 - 46 %   Lymphs Abs 1.1 0.7 - 4.0 K/uL   Monocytes Relative 6 3 - 12 %   Monocytes Absolute 0.4 0.1 - 1.0 K/uL   Eosinophils Relative 1 0 - 5 %   Eosinophils Absolute 0.0 0.0 - 0.7 K/uL   Basophils Relative 0 0 - 1 %   Basophils Absolute 0.0 0.0 - 0.1 K/uL  Basic metabolic panel     Status: None   Collection Time: 07/13/15  9:04 PM  Result Value Ref  Range   Sodium 139 135 - 145 mmol/L   Potassium 3.6 3.5 - 5.1 mmol/L   Chloride 108 101 - 111 mmol/L   CO2 22 22 - 32 mmol/L   Glucose, Bld 90 65 - 99 mg/dL   BUN 7 6 - 20 mg/dL   Creatinine, Ser 0.80 0.44 - 1.00 mg/dL   Calcium 9.1 8.9 - 10.3 mg/dL   GFR calc non Af Amer >60 >60 mL/min   GFR calc Af Amer >60 >60 mL/min    Comment: (NOTE) April eGFR has been calculated using April CKD EPI equation. This calculation has not been validated in all clinical situations. eGFR's persistently <60 mL/min signify possible Chronic Kidney Disease.    Anion gap 9 5 - 15  Basic metabolic panel     Status: Abnormal   Collection Time: 07/14/15  5:44 AM  Result Value Ref Range   Sodium 140 135 - 145 mmol/L   Potassium 3.6 3.5 - 5.1 mmol/L   Chloride 108 101 - 111 mmol/L   CO2 24 22 - 32 mmol/L   Glucose, Bld 110 (H) 65 - 99 mg/dL   BUN 8 6 - 20 mg/dL   Creatinine, Ser 0.79 0.44 - 1.00 mg/dL   Calcium 8.6 (L) 8.9 - 10.3 mg/dL    GFR calc non Af Amer >60 >60 mL/min   GFR calc Af Amer >60 >60 mL/min    Comment: (NOTE) April eGFR has been calculated using April CKD EPI equation. This calculation has not been validated in all clinical situations. eGFR's persistently <60 mL/min signify possible Chronic Kidney Disease.    Anion gap 8 5 - 15  CBC     Status: Abnormal   Collection Time: 07/14/15  5:44 AM  Result Value Ref Range   WBC 6.1 4.0 - 10.5 K/uL   RBC 3.73 (L) 3.87 - 5.11 MIL/uL   Hemoglobin 12.0 12.0 - 15.0 g/dL   HCT 35.4 (L) 36.0 - 46.0 %   MCV 94.9 78.0 - 100.0 fL   MCH 32.2 26.0 - 34.0 pg   MCHC 33.9 30.0 - 36.0 g/dL   RDW 14.1 11.5 - 15.5 %   Platelets 233 150 - 400 K/uL    Dg Ankle Complete Left  07/13/2015   CLINICAL DATA:  Pain following fall into hole  EXAM: LEFT ANKLE COMPLETE - 3+ VIEW  COMPARISON:  None.  FINDINGS: Frontal, oblique, and lateral views were obtained. There is soft tissue swelling. There is an obliquely oriented fracture of April distal fibular diaphysis in near anatomic alignment. No other fracture. No appreciable joint effusion. April ankle mortise appears intact. There is a small spur arising from April inferior calcaneus.  IMPRESSION: Obliquely oriented fracture distal fibular diaphysis in near anatomic alignment. Soft tissue swelling. Ankle mortise appears intact.   Electronically Signed   By: Lowella Grip III M.D.   On: 07/13/2015 20:32   Dg Ankle Complete Right  07/13/2015   CLINICAL DATA:  Pain following fall into hole  EXAM: RIGHT ANKLE - COMPLETE 3+ VIEW  COMPARISON:  None.  FINDINGS: Frontal, oblique, and lateral views were obtained. There is a transversely oriented fracture of April distal fibular metaphysis in near anatomic alignment. No other fracture appreciable. No joint effusion. April ankle mortise appears intact. There is a spur arising from April inferior calcaneus.  IMPRESSION: Transversely oriented fracture distal fibular metaphysis in near anatomic alignment. Ankle mortise  appears intact. Small inferior calcaneal spur.   Electronically Signed   By: Gwyndolyn Saxon  Jasmine December III M.D.   On: 07/13/2015 20:33    Review of Systems  Constitutional: Negative.   HENT: Negative.   Eyes: Negative.   Respiratory: Negative.   Cardiovascular: Negative.   Gastrointestinal: Negative.   Genitourinary: Negative.   Musculoskeletal: Positive for joint pain.  Skin: Negative.   Neurological: Negative.   Endo/Heme/Allergies: Negative.   Psychiatric/Behavioral: Negative.    Blood pressure 128/68, pulse 76, temperature 98 F (36.7 C), temperature source Oral, resp. rate 20, height $RemoveBe'5\' 3"'[id number]$  (1.6 m), weight 97.569 kg (215 lb 1.6 oz), SpO2 98 %. Physical Exam  Constitutional: She appears well-developed.  HENT:  Head: Normocephalic.  Eyes: Pupils are equal, round, and reactive to light.  Neck: Normal range of motion.  Cardiovascular: Normal rate.   Respiratory: Effort normal.  Neurological: She is alert.  Skin: Skin is warm.  Psychiatric: She has a normal mood and affect.   bilateral ankles are examined she has tenderness and swelling lateral malleolus bilaterally pedal pulses palpable bilaterally no knee effusions. No medial sided tenderness on either ankle. Sensation intact dorsal plantar surface of April feet. Right hand is splinted but April splint does extend beyond April junction of April fingers and palm which is limiting her MCP flexion. Radiographs from April fourth do show nondisplaced distal radius fracture  Assessment/Plan: Impression is right distal radius fracture nondisplaced which occurred 06/28/2015 this needs to be changed from April current splint into a removable wrist splint. Inherently this fracture is stable and nondisplaced and she should only require 2 more weeks of immobilization. In regards to both ankle she has transverse lateral malleolus fracture on April right which is nondisplaced with no widening of April mortise she has an oblique fracture lateral malleolus on April left  also nondisplaced with no widening of April mortise. Plan April situation for April [name] in general these fractures are nonoperative. They should heal with that. Of immobilization and nonweightbearing. That should last about 3 weeks. Went to put her an Ace wraps and fracture boots. She can be discharged home once April social situation is worked out. A day she is fine to stand and pivot on April right foot as this is April more inherently stable fracture but she needs to maintain nonweightbearing on April left. I'll see her back in 10 days for repeat radiographs on April ankles to make sure that they are remaining stable and nondisplaced. They she should be able to propel a wheelchair with April wrist splint on April right-hand side she will also need DVT prophylaxis during this period  April [name] 07/14/2015, 8:01 AM

## 2015-07-14 NOTE — Discharge Summary (Signed)
Physician Discharge Summary  April [name]:[id number] DOB: May 05, 1961 DOA: 07/13/2015  PCP: Elizabeth Sauer  Admit date: 07/13/2015 Discharge date: 07/14/2015  Recommendations for Outpatient Follow-up:  1.  Dr. Marcene Duos f/u in 10 days for repeat radiographs of the bilateral ankles. She has an Ace wrap applied to her right ankle.   2.  Stand and pivot on the right foot, nonweightbearing on the left foot. 3.  DVT prophylaxis with apixaban for minimum of one month  Discharge Diagnoses:  Principal Problem:   Bilateral fibular fractures   Discharge Condition: stable, improved  Diet recommendation: regular  Wt Readings from Last 3 Encounters:  07/13/15 97.569 kg (215 lb 1.6 oz)  05/01/13 101.152 kg (223 lb)  03/20/13 99.338 kg (219 lb)    History of present illness:  The patient is a 54 year old female who sustained a wrist fracture on 06/28/2015. She represented after she had a fall into a hole in front of a fire station which caused her to fracture both of her ankles. She now has bilateral distal fibular fractures. She was admitted because she was nonweightbearing and had uncontrolled pain.  Hospital Course:   Bilateral distal fibular fractures with recent right radial fracture. She was seen by orthopedic surgery who recommended nonoperative management. They applied Ace bandages to both ankles and provided fracture boot for the left foot. She was advised to be nonweightbearing on her left foot and she may stand and pivot on her right foot.  She was seen by physical therapy recommended home health PT. They recommended a 3 and 1 bedside commode, wheelchair with bilateral lower extremity elevators.  She will be set up for home health nurse, aid, PT, OT.  She is high risk for DVTs and she is essentially nonweightbearing for the next 3 weeks. She was started on Apixaban 2.5 mg by mouth twice a day for DVT prophylaxis for the next month. The case manager assisted her with medication  affordability and follow-up appointments.  Procedures:  X-rays of the bilateral ankles  Consultations:  Orthopedics, Dr. Marlou Sa  Discharge Exam: Filed Vitals:   07/14/15 1046  BP: 126/72  Pulse: 72  Temp: 97.7 F (36.5 C)  Resp: 18   Filed Vitals:   07/13/15 2333 07/14/15 0143 07/14/15 0600 07/14/15 1046  BP: 126/63 132/69 128/68 126/72  Pulse: 89 80 76 72  Temp: 97.6 F (36.4 C) 97.8 F (36.6 C) 98 F (36.7 C) 97.7 F (36.5 C)  TempSrc: Oral Oral Oral Oral  Resp: 20 19 20 18   Height: 5\' 3"  (1.6 m)     Weight: 97.569 kg (215 lb 1.6 oz)     SpO2: 98% 99% 98% 99%    General: Adult female, no acute distress Cardiovascular: Regular rate and rhythm, no murmurs, rubs, or gallops Respiratory: Clear to auscultation bilaterally Abdomen: NABS, soft, nondistended, nontender MSK:  Removable wrist splint on the right wrist. 2+ pulses, sensation intact, less than 2 second cap refill of the right fingers. Bilateral lower extremities without edema or calf tenderness. Both ankles are wrapped in Ace bandages recently by the orthopedic surgeon. Able to wiggle toes on both feet, less than 2 second cap refill, toes are warm. Boot replaced on her left ankle.  Discharge Instructions      Discharge Instructions    DME Other see comment    Complete by:  As directed   Slide board     Face-to-face encounter (required for Medicare/Medicaid patients)    Complete by:  As directed  I Khira Cudmore certify that this patient is under my care and that I, or a nurse practitioner or physician's assistant working with me, had a face-to-face encounter that meets the physician face-to-face encounter requirements with this patient on 07/14/2015. The encounter with the patient was in whole, or in part for the following medical condition(s) which is the primary reason for home health care (List medical condition):  54 yo F with bilateral fibular fractures would benefit from aid to assist with ADLs, RN to  escalate care if needed,  PT/OT to work on strength, endurance, maintaining flexibility and working on transfers on the right foot.  Left foot is non-weightbearing.  The encounter with the patient was in whole, or in part, for the following medical condition, which is the primary reason for home health care:  yes  I certify that, based on my findings, the following services are medically necessary home health services:   Nursing Physical therapy    Reason for Medically Necessary Home Health Services:   Skilled Nursing- Change/Decline in Patient Status Therapy- Therapeutic Exercises to Increase Strength and Endurance Therapy- Instruction on use of Assistive Device for Ambulation on all Surfaces    My clinical findings support the need for the above services:  Can transfer bed to chair only  Further, I certify that my clinical findings support that this patient is homebound due to:  Can transfer bed to chair only     For home use only DME 3 n 1    Complete by:  As directed      For home use only DME standard manual wheelchair with seat cushion    Complete by:  As directed   Patient suffers from bilateral fibular fractures and right wrist fracture which impairs their ability to perform daily activities like bathing, dressing, feeding, grooming and toileting in the home.  A cane, crutch or walker will not resolve  issue with performing activities of daily living. A wheelchair will allow patient to safely perform daily activities. Patient can safely propel the wheelchair in the home or has a caregiver who can provide assistance.  Accessories: elevating leg rests (ELRs), wheel locks, extensions and anti-tippers.     Home Health    Complete by:  As directed   To provide the following care/treatments:   PT OT RN Home Health Aide              Medication List    TAKE these medications        apixaban 2.5 MG Tabs tablet  Commonly known as:  ELIQUIS  Take 1 tablet (2.5 mg total) by mouth 2  (two) times daily.     cetirizine 10 MG tablet  Commonly known as:  ZYRTEC  Take 10 mg by mouth daily.     ibuprofen 600 MG tablet  Commonly known as:  ADVIL,MOTRIN  Take 1 tablet (600 mg total) by mouth every 6 (six) hours as needed.     LORazepam 1 MG tablet  Commonly known as:  ATIVAN  Take 0.5 tablets (0.5 mg total) by mouth every 8 (eight) hours as needed for anxiety.     morphine 15 MG tablet  Commonly known as:  MSIR  Take 1 tablet (15 mg total) by mouth every 4 (four) hours as needed for moderate pain or severe pain.     ondansetron 4 MG tablet  Commonly known as:  ZOFRAN  Take 1 tablet (4 mg total) by mouth every 6 (six) hours as needed  for nausea.       Follow-up Information    Follow up with [facility], PA-C. Schedule an appointment as soon as possible for a visit in 1 month.   Specialty:  Physician Assistant   Why:  As needed   Contact information:   [place] [address] [phone]       Follow up with Meredith Pel, MD. Schedule an appointment as soon as possible for a visit in 10 days.   Specialty:  Orthopedic Surgery   Contact information:   [place] [address] [phone]        The results of significant diagnostics from this hospitalization (including imaging, microbiology, ancillary and laboratory) are listed below for reference.    Significant Diagnostic Studies: Dg Wrist Complete Right  07/08/2015   CLINICAL DATA:  Right wrist pain post fall 10 days ago  EXAM: RIGHT WRIST - COMPLETE 3+ VIEW  COMPARISON:  None.  FINDINGS: Three scratches four views of the right wrist submitted. There is mild impacted nondisplaced fracture in distal radial metaphysis.  IMPRESSION: Mild impacted nondisplaced fracture in distal right radial metaphysis.   Electronically Signed   By: Lahoma Crocker M.D.   On: 07/08/2015 15:31   Dg Ankle Complete Left  07/13/2015   CLINICAL DATA:  Pain following fall into hole  EXAM: LEFT ANKLE  COMPLETE - 3+ VIEW  COMPARISON:  None.  FINDINGS: Frontal, oblique, and lateral views were obtained. There is soft tissue swelling. There is an obliquely oriented fracture of the distal fibular diaphysis in near anatomic alignment. No other fracture. No appreciable joint effusion. The ankle mortise appears intact. There is a small spur arising from the inferior calcaneus.  IMPRESSION: Obliquely oriented fracture distal fibular diaphysis in near anatomic alignment. Soft tissue swelling. Ankle mortise appears intact.   Electronically Signed   By: Lowella Grip III M.D.   On: 07/13/2015 20:32   Dg Ankle Complete Right  07/13/2015   CLINICAL DATA:  Pain following fall into hole  EXAM: RIGHT ANKLE - COMPLETE 3+ VIEW  COMPARISON:  None.  FINDINGS: Frontal, oblique, and lateral views were obtained. There is a transversely oriented fracture of the distal fibular metaphysis in near anatomic alignment. No other fracture appreciable. No joint effusion. The ankle mortise appears intact. There is a spur arising from the inferior calcaneus.  IMPRESSION: Transversely oriented fracture distal fibular metaphysis in near anatomic alignment. Ankle mortise appears intact. Small inferior calcaneal spur.   Electronically Signed   By: Lowella Grip III M.D.   On: 07/13/2015 20:33    Microbiology: No results found for this or any previous visit (from the past 240 hour(s)).   Labs: Basic Metabolic Panel:  Recent Labs Lab 07/13/15 2104 07/14/15 0544  NA 139 140  K 3.6 3.6  CL 108 108  CO2 22 24  GLUCOSE 90 110*  BUN 7 8  CREATININE 0.80 0.79  CALCIUM 9.1 8.6*   Liver Function Tests: No results for input(s): AST, ALT, ALKPHOS, BILITOT, PROT, ALBUMIN in the last 168 hours. No results for input(s): LIPASE, AMYLASE in the last 168 hours. No results for input(s): AMMONIA in the last 168 hours. CBC:  Recent Labs Lab 07/13/15 2104 07/14/15 0544  WBC 6.2 6.1  NEUTROABS 4.7  --   HGB 13.2 12.0  HCT 38.2  35.4*  MCV 92.7 94.9  PLT 278 233   Cardiac Enzymes: No results for input(s): CKTOTAL, CKMB, CKMBINDEX, TROPONINI in the last 168 hours.  BNP: BNP (last 3 results) No results for input(s): BNP in the last 8760 hours.  ProBNP (last 3 results) No results for input(s): PROBNP in the last 8760 hours.  CBG: No results for input(s): GLUCAP in the last 168 hours.  Time coordinating discharge: 35 minutes  Signed:  An Lannan  [facility] 07/14/2015, 11:25 AM
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
## 2015-07-14 NOTE — Progress Notes (Signed)
Orthopedic Tech Progress Note Patient Details:  April [name] July 27, 1961 [id number]  Ortho Devices Type of Ortho Device: Velcro wrist splint Ortho Device/Splint Location: rue Ortho Device/Splint Interventions: Application As ordered by Dr. Lubertha Sayres, April [name] 07/14/2015, 8:45 AM

## 2015-07-14 NOTE — Clinical Social Work Note (Signed)
CSW consult acknowledged:  Clinical Education officer, museum received consult for Home Health and PCP. Please refer to [facility]. CSW to notify RNCM. Clinical Social Worker will sign off for now as social work intervention is no longer needed. Please consult [name] again if new need arises.  Glendon Axe, MSW, LCSWA [phone] 07/14/2015 1:29 PM

## 2015-08-10 ENCOUNTER — Encounter (HOSPITAL_COMMUNITY): Payer: Self-pay | Admitting: *Deleted

## 2015-08-10 ENCOUNTER — Other Ambulatory Visit (HOSPITAL_COMMUNITY): Payer: Self-pay | Admitting: *Deleted

## 2015-08-10 DIAGNOSIS — N6310 Unspecified lump in the right breast, unspecified quadrant: Secondary | ICD-10-CM

## 2015-08-16 ENCOUNTER — Ambulatory Visit (HOSPITAL_COMMUNITY): Payer: Self-pay

## 2015-08-18 ENCOUNTER — Other Ambulatory Visit: Payer: Self-pay

## 2015-08-25 ENCOUNTER — Telehealth (HOSPITAL_COMMUNITY): Payer: Self-pay | Admitting: *Deleted

## 2015-08-25 NOTE — Telephone Encounter (Signed)
Telephoned patient at home # and left message to return call to [facility] 

## 2015-09-16 ENCOUNTER — Inpatient Hospital Stay (HOSPITAL_COMMUNITY): Admission: RE | Admit: 2015-09-16 | Payer: No Typology Code available for payment source | Source: Ambulatory Visit

## 2015-09-16 ENCOUNTER — Other Ambulatory Visit: Payer: No Typology Code available for payment source

## 2015-12-05 DIAGNOSIS — E7800 Pure hypercholesterolemia, unspecified: Secondary | ICD-10-CM

## 2015-12-05 HISTORY — DX: Pure hypercholesterolemia, unspecified: E78.00

## 2016-01-10 ENCOUNTER — Ambulatory Visit (INDEPENDENT_AMBULATORY_CARE_PROVIDER_SITE_OTHER): Payer: Self-pay | Admitting: Internal Medicine

## 2016-01-10 ENCOUNTER — Encounter: Payer: Self-pay | Admitting: Internal Medicine

## 2016-01-10 VITALS — BP 122/78 | HR 82 | Ht 64.0 in | Wt 216.0 lb

## 2016-01-10 DIAGNOSIS — K029 Dental caries, unspecified: Secondary | ICD-10-CM

## 2016-01-10 DIAGNOSIS — A609 Anogenital herpesviral infection, unspecified: Secondary | ICD-10-CM

## 2016-01-10 DIAGNOSIS — F329 Major depressive disorder, single episode, unspecified: Secondary | ICD-10-CM

## 2016-01-10 DIAGNOSIS — A6000 Herpesviral infection of urogenital system, unspecified: Secondary | ICD-10-CM

## 2016-01-10 DIAGNOSIS — F32A Depression, unspecified: Secondary | ICD-10-CM

## 2016-01-10 MED ORDER — DULOXETINE HCL 30 MG PO CPEP: ORAL_CAPSULE | ORAL | Status: DC

## 2016-01-10 MED ORDER — ACYCLOVIR 400 MG PO TABS: ORAL_TABLET | ORAL | Status: DC

## 2016-01-10 MED ORDER — DULOXETINE HCL 60 MG PO CPEP: ORAL_CAPSULE | ORAL | Status: DC

## 2016-01-10 NOTE — Patient Instructions (Signed)
Do not take really hot showers Dove soap Lotion up right after patting dry--and would use lotion one other time per day Eucerin Eczema relief  Please check with your psychiatrist to make sure this change in medication is okay. After you are able to pick up the Cymbalta and get started, please call to schedule a follow up in 1 week. You should be able to stop the Celexa one day and start the Cymbalta the next day if you can make the change soon
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
## 2016-01-11 ENCOUNTER — Telehealth (HOSPITAL_COMMUNITY): Payer: Self-pay | Admitting: *Deleted

## 2016-01-11 NOTE — Telephone Encounter (Signed)
Telephoned patient at home # and person is unavailable

## 2016-01-31 ENCOUNTER — Ambulatory Visit: Payer: Self-pay | Admitting: Internal Medicine

## 2016-02-01 ENCOUNTER — Ambulatory Visit: Payer: Self-pay | Admitting: Internal Medicine

## 2016-02-02 ENCOUNTER — Encounter: Payer: Self-pay | Admitting: Internal Medicine

## 2016-02-02 ENCOUNTER — Ambulatory Visit (INDEPENDENT_AMBULATORY_CARE_PROVIDER_SITE_OTHER): Payer: Self-pay | Admitting: Internal Medicine

## 2016-02-02 VITALS — BP 120/84 | HR 82 | Ht 64.0 in | Wt 215.0 lb

## 2016-02-02 DIAGNOSIS — A6000 Herpesviral infection of urogenital system, unspecified: Secondary | ICD-10-CM

## 2016-02-02 DIAGNOSIS — M797 Fibromyalgia: Secondary | ICD-10-CM

## 2016-02-02 DIAGNOSIS — Z23 Encounter for immunization: Secondary | ICD-10-CM

## 2016-02-02 DIAGNOSIS — F32A Depression, unspecified: Secondary | ICD-10-CM

## 2016-02-02 DIAGNOSIS — R21 Rash and other nonspecific skin eruption: Secondary | ICD-10-CM

## 2016-02-02 DIAGNOSIS — J3089 Other allergic rhinitis: Secondary | ICD-10-CM

## 2016-02-02 DIAGNOSIS — F329 Major depressive disorder, single episode, unspecified: Secondary | ICD-10-CM

## 2016-02-02 MED ORDER — DESLORATADINE 5 MG PO TABS: 5.0000 mg | ORAL_TABLET | Freq: Every day | ORAL | Status: DC

## 2016-02-02 MED ORDER — MOMETASONE FUROATE 50 MCG/ACT NA SUSP: NASAL | Status: DC

## 2016-02-02 MED ORDER — MONTELUKAST SODIUM 10 MG PO TABS: 10.0000 mg | ORAL_TABLET | Freq: Every day | ORAL | Status: DC
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
## 2016-03-15 ENCOUNTER — Ambulatory Visit (INDEPENDENT_AMBULATORY_CARE_PROVIDER_SITE_OTHER): Payer: Self-pay | Admitting: Internal Medicine

## 2016-03-15 ENCOUNTER — Encounter: Payer: Self-pay | Admitting: Internal Medicine

## 2016-03-15 VITALS — BP 120/88 | HR 80 | Resp 16 | Ht 64.0 in | Wt 223.8 lb

## 2016-03-15 DIAGNOSIS — H547 Unspecified visual loss: Secondary | ICD-10-CM

## 2016-03-15 DIAGNOSIS — A6000 Herpesviral infection of urogenital system, unspecified: Secondary | ICD-10-CM

## 2016-03-15 DIAGNOSIS — M797 Fibromyalgia: Secondary | ICD-10-CM

## 2016-03-15 DIAGNOSIS — J0140 Acute pansinusitis, unspecified: Secondary | ICD-10-CM

## 2016-03-15 DIAGNOSIS — R21 Rash and other nonspecific skin eruption: Secondary | ICD-10-CM | POA: Insufficient documentation

## 2016-03-15 DIAGNOSIS — K029 Dental caries, unspecified: Secondary | ICD-10-CM

## 2016-03-15 DIAGNOSIS — F32A Depression, unspecified: Secondary | ICD-10-CM

## 2016-03-15 DIAGNOSIS — F329 Major depressive disorder, single episode, unspecified: Secondary | ICD-10-CM

## 2016-03-15 MED ORDER — AZITHROMYCIN 250 MG PO TABS: ORAL_TABLET | ORAL | Status: DC

## 2016-03-15 MED ORDER — GABAPENTIN 100 MG PO CAPS: ORAL_CAPSULE | ORAL | Status: DC

## 2016-03-15 NOTE — Progress Notes (Signed)
Subjective:    Patient ID: April [name], female    DOB: 10-30-1961, 55 y.o.   MRN: [id number]  HPI   1.  Fibromyalgia:  Cymbalta helping, though still with some pain and depression. Has been taking since 01/24/2016.  Has been on Gabapentin with pain before with good result.  Having leg discomfort at night and keeps her up.    2.  Depression:  Has counselor at [facility], Lafonda Mosses, and that is working out well.  She is reportedly aware of drug changes.    3.  Pruritic Skin Lesions:  Feels the rash is better since on Montelukast.  Using Dove Soap, lukewarm showers and Eucerin cream right after.  Again, has never been to allergist.  Discussed would need to come off antihistamine she is taking at the time.  When in 20s--underwent skin testing--allergic to molds  4.  Recurrent Herpes outbreaks:  No outbreaks since on suppressive Acyclovir.   5.  Decreased Visual Acuity:  Using some sort of transitional lens.  Has not had eyes checked in 4 years.  Current lenses are very scratched up  6.  Dental:  Has a broken tooth would like to have assessed.      Current outpatient prescriptions:  .  acyclovir (ZOVIRAX) 400 MG tablet, 1 tab by mouth twice daily, Disp: 60 tablet, Rfl: 11 .  cetirizine (ZYRTEC) 10 MG tablet, Take 10 mg by mouth daily., Disp: , Rfl:  .  DULoxetine (CYMBALTA) 60 MG capsule, Start 1 capsule by mouth daily after completing 30 mg capsule dosing., Disp: 30 capsule, Rfl: 1 .  montelukast (SINGULAIR) 10 MG tablet, Take 1 tablet (10 mg total) by mouth at bedtime., Disp: 30 tablet, Rfl: 10 .  desloratadine (CLARINEX) 5 MG tablet, Take 1 tablet (5 mg total) by mouth daily. (Patient not taking: Reported on 03/15/2016), Disp: 30 tablet, Rfl: 11 .  DULoxetine (CYMBALTA) 30 MG capsule, 1 capsule by mouth for 3 days, then increase to 2 capsules once daily thereafter.  Switch to the 60 mg capsules for next refill, Disp: 60 capsule, Rfl: 0 .  ibuprofen (ADVIL,MOTRIN) 600 MG tablet, Take 1 tablet  (600 mg total) by mouth every 6 (six) hours as needed. (Patient taking differently: Take 600 mg by mouth every 6 (six) hours as needed (pain). ), Disp: 30 tablet, Rfl: 0 .  mometasone (NASONEX) 50 MCG/ACT nasal spray, 2 sprays each nostril daily (Patient not taking: Reported on 03/15/2016), Disp: 17 g, Rfl: 11   Allergies  Allergen Reactions  . Hydrocodone Nausea Only and Other (See Comments)    dizziness  . Ultram [Tramadol Hcl] Nausea Only   Review of Systems     Objective:   Physical Exam   NAD HEENT:  PERRL, EOMI, conjunctivae without injection.  Yellow nasal discharge. Cobbling of posterior pharynx Broken tooth Left TM is dull and retracted, Right TM pearly gray. Tender over frontal and maxillary sinuses Neck:  Supple, no adenopathy  Chest:  CTA CV:  RRR without murmur or rub, radial pulses normal and equal Skin:  Scattered areas of scratching, scabbed ulcerative lesions--arms and upper back       Assessment & Plan:  1. Acute Sinusitis:  Azithromycin, continue allergy medications as well  2.  Pruritic skin lesions:  Have never seen lesions before scratching.  Pt. Has never presented with obvious rash, just scratched lesions.  Send to allergist for further evaluation.  Concerned she is unable to stay on meds that may be helpful  for her other health concerns as she feels this pruritis is related to many meds she takes.  If allergy work up is negative, will discuss with her counselor.  3.  Depression: Improved.  To continue Cymbalta and counseling.  4.  Herpes:  Controlled  5. Dental decay and broken tooth:  Dental referral  6.  Decreased visual acuity:  Optometry referral.

## 2016-03-27 ENCOUNTER — Telehealth: Payer: Self-pay | Admitting: Internal Medicine

## 2016-03-27 NOTE — Telephone Encounter (Signed)
Patient has stopped taking Rx (gabapentin) NEURONTIN 100 mg since March 20, 2016.  Patient believes that Rx has created an itchy rash.  Please advise.  Patient may be reached at [phone].u

## 2016-04-04 NOTE — Telephone Encounter (Signed)
Called and left message to call the clinic and give a progress report regarding her rash as unable to speak with patient.

## 2016-04-07 NOTE — Telephone Encounter (Signed)
Patient returned Dr. Melissa Noon call . Patient states her breakout has not stopped and has sores all over her arms and chest. Patient will be available at any time and would like Dr. Amil Amen to return her call.

## 2016-04-12 ENCOUNTER — Ambulatory Visit (INDEPENDENT_AMBULATORY_CARE_PROVIDER_SITE_OTHER): Payer: Self-pay | Admitting: Internal Medicine

## 2016-04-12 ENCOUNTER — Other Ambulatory Visit: Payer: Self-pay | Admitting: Internal Medicine

## 2016-04-12 ENCOUNTER — Encounter: Payer: Self-pay | Admitting: Internal Medicine

## 2016-04-12 VITALS — BP 146/104 | HR 74 | Resp 16 | Ht 64.0 in | Wt 228.0 lb

## 2016-04-12 DIAGNOSIS — R21 Rash and other nonspecific skin eruption: Secondary | ICD-10-CM

## 2016-04-12 NOTE — Progress Notes (Signed)
Subjective:    Patient ID: April [name], female    DOB: Nov 21, 1961, 55 y.o.   MRN: [id number]  HPI   Pruritic Rash:  Was doing okay with her itching until after being on Gabapentin 300 mg for 3 days. Developed itching and when scratched, developed little bumps all over, would develop tiny blisters and then either turn into raised purplish area or breaking and then ulcerating.  Continues to scratch these areas as they itch.  ARms, back, buttocks and recently had a right second toe.   Stopped the Gabapentin the 17th of April and feels like the itching and rash is gradually calming. Feels the Singulair has helped.  We discussed over the phone cool baths.  Those have helped.   She has not been able to think of any other exposure that may be setting off her dermatologic complaints. During exam, however, note she appears to color her hair.  States she does this about once monthly and rinses off in shower--so the chemicals do run down on her back/upper arms in particular.    Current outpatient prescriptions:  .  acyclovir (ZOVIRAX) 400 MG tablet, 1 tab by mouth twice daily, Disp: 60 tablet, Rfl: 11 .  cetirizine (ZYRTEC) 10 MG tablet, Take 10 mg by mouth daily., Disp: , Rfl:  .  DULoxetine (CYMBALTA) 60 MG capsule, Start 1 capsule by mouth daily after completing 30 mg capsule dosing., Disp: 30 capsule, Rfl: 1 .  montelukast (SINGULAIR) 10 MG tablet, Take 1 tablet (10 mg total) by mouth at bedtime., Disp: 30 tablet, Rfl: 10 .  desloratadine (CLARINEX) 5 MG tablet, Take 1 tablet (5 mg total) by mouth daily. (Patient not taking: Reported on 03/15/2016), Disp: 30 tablet, Rfl: 11 .  DULoxetine (CYMBALTA) 30 MG capsule, 1 capsule by mouth for 3 days, then increase to 2 capsules once daily thereafter.  Switch to the 60 mg capsules for next refill (Patient not taking: Reported on 04/12/2016), Disp: 60 capsule, Rfl: 0 .  gabapentin (NEURONTIN) 100 MG capsule, 1 cap by mouth at bedtime.  Increase every 3 days  by 1 cap at bedtime to max of 300 mg (Patient not taking: Reported on 04/12/2016), Disp: 90 capsule, Rfl: 3 .  ibuprofen (ADVIL,MOTRIN) 600 MG tablet, Take 1 tablet (600 mg total) by mouth every 6 (six) hours as needed. (Patient not taking: Reported on 04/12/2016), Disp: 30 tablet, Rfl: 0 .  mometasone (NASONEX) 50 MCG/ACT nasal spray, 2 sprays each nostril daily (Patient not taking: Reported on 03/15/2016), Disp: 17 g, Rfl: 11   Allergies  Allergen Reactions  . Hydrocodone Nausea Only and Other (See Comments)    dizziness  . Ultram [Tramadol Hcl] Nausea Only      Review of Systems     Objective:   Physical Exam NAD Upper back with 1/2 cm open ulcerative type lesions with a background of vertical scarring from scratching.  Similar lesions on arms, mainly upper arms.  She points to a 1-2 mm skin colored bump on her left forearm as an example of what the lesions first look like.  I would not see this if she had not pointed out.  There are no other similar lesions I can see.       Assessment & Plan:  1.  Pruritis:  Still not convinced this is an allergic skin phenomenon.  To see allergist hopefully soon.   Hold on the Wellbutrin for now.   Stop coloring hair or now as well to see  if makes a difference.   Continue on current regimen for possible allergies.  Cetirizine/Montelukast.

## 2016-04-12 NOTE — Patient Instructions (Signed)
No Hair color. See if you can think back to whether the rash gets worse each month after you color your hair

## 2016-04-13 LAB — CBC WITH DIFFERENTIAL/PLATELET
BASOS ABS: 0 10*3/uL (ref 0.0–0.2)
Basos: 0 %
EOS (ABSOLUTE): 0.1 10*3/uL (ref 0.0–0.4)
Eos: 3 %
Hematocrit: 39.7 % (ref 34.0–46.6)
Hemoglobin: 13.2 g/dL (ref 11.1–15.9)
IMMATURE GRANS (ABS): 0 10*3/uL (ref 0.0–0.1)
IMMATURE GRANULOCYTES: 0 %
LYMPHS: 23 %
Lymphocytes Absolute: 1.2 10*3/uL (ref 0.7–3.1)
MCH: 31.7 pg (ref 26.6–33.0)
MCHC: 33.2 g/dL (ref 31.5–35.7)
MCV: 95 fL (ref 79–97)
MONOCYTES: 9 %
Monocytes Absolute: 0.5 10*3/uL (ref 0.1–0.9)
NEUTROS ABS: 3.4 10*3/uL (ref 1.4–7.0)
Neutrophils: 65 %
PLATELETS: 321 10*3/uL (ref 150–379)
RBC: 4.17 x10E6/uL (ref 3.77–5.28)
RDW: 15.6 % — ABNORMAL HIGH (ref 12.3–15.4)
WBC: 5.2 10*3/uL (ref 3.4–10.8)

## 2016-04-13 LAB — SEDIMENTATION RATE: SED RATE: 33 mm/h (ref 0–40)

## 2016-04-26 ENCOUNTER — Other Ambulatory Visit: Payer: Self-pay

## 2016-04-26 DIAGNOSIS — F329 Major depressive disorder, single episode, unspecified: Secondary | ICD-10-CM

## 2016-04-26 DIAGNOSIS — F32A Depression, unspecified: Secondary | ICD-10-CM

## 2016-04-26 DIAGNOSIS — M797 Fibromyalgia: Secondary | ICD-10-CM

## 2016-04-26 MED ORDER — DULOXETINE HCL 60 MG PO CPEP: 60.0000 mg | ORAL_CAPSULE | Freq: Every day | ORAL | Status: DC

## 2016-04-26 MED ORDER — DULOXETINE HCL 30 MG PO CPEP: ORAL_CAPSULE | ORAL | Status: DC

## 2016-04-27 ENCOUNTER — Ambulatory Visit (INDEPENDENT_AMBULATORY_CARE_PROVIDER_SITE_OTHER): Payer: Self-pay

## 2016-04-27 VITALS — BP 136/82 | HR 68

## 2016-04-27 DIAGNOSIS — R030 Elevated blood-pressure reading, without diagnosis of hypertension: Secondary | ICD-10-CM

## 2016-04-28 DIAGNOSIS — R030 Elevated blood-pressure reading, without diagnosis of hypertension: Secondary | ICD-10-CM | POA: Insufficient documentation

## 2016-05-02 ENCOUNTER — Other Ambulatory Visit: Payer: Self-pay | Admitting: Internal Medicine

## 2016-05-02 DIAGNOSIS — F32A Depression, unspecified: Secondary | ICD-10-CM

## 2016-05-02 DIAGNOSIS — F329 Major depressive disorder, single episode, unspecified: Secondary | ICD-10-CM

## 2016-05-02 MED ORDER — DULOXETINE HCL 60 MG PO CPEP: 60.0000 mg | ORAL_CAPSULE | Freq: Every day | ORAL | Status: DC

## 2016-06-14 ENCOUNTER — Other Ambulatory Visit (HOSPITAL_COMMUNITY): Payer: Self-pay | Admitting: *Deleted

## 2016-06-14 ENCOUNTER — Encounter: Payer: Self-pay | Admitting: Internal Medicine

## 2016-06-14 ENCOUNTER — Ambulatory Visit (INDEPENDENT_AMBULATORY_CARE_PROVIDER_SITE_OTHER): Payer: Self-pay | Admitting: Internal Medicine

## 2016-06-14 VITALS — BP 118/70 | HR 72 | Resp 20 | Ht 65.0 in | Wt 225.0 lb

## 2016-06-14 DIAGNOSIS — F329 Major depressive disorder, single episode, unspecified: Secondary | ICD-10-CM

## 2016-06-14 DIAGNOSIS — R21 Rash and other nonspecific skin eruption: Secondary | ICD-10-CM

## 2016-06-14 DIAGNOSIS — N6310 Unspecified lump in the right breast, unspecified quadrant: Secondary | ICD-10-CM

## 2016-06-14 DIAGNOSIS — N63 Unspecified lump in breast: Secondary | ICD-10-CM

## 2016-06-14 DIAGNOSIS — J3089 Other allergic rhinitis: Secondary | ICD-10-CM

## 2016-06-14 DIAGNOSIS — F32A Depression, unspecified: Secondary | ICD-10-CM

## 2016-06-14 DIAGNOSIS — M797 Fibromyalgia: Secondary | ICD-10-CM

## 2016-06-14 DIAGNOSIS — F419 Anxiety disorder, unspecified: Secondary | ICD-10-CM

## 2016-06-14 MED ORDER — DULOXETINE HCL 30 MG PO CPEP: ORAL_CAPSULE | ORAL | Status: DC

## 2016-06-14 NOTE — Progress Notes (Signed)
Subjective:    Patient ID: April [name], female    DOB: 1961-04-05, 55 y.o.   MRN: [id number]  HPI   1.  Fibromyalgia:  Diffuse pain:  Arms, legs.  Legs in particular start hurting badly if stands for long time.  States this is a definite improvement. Still only getting 5 hours of sleep nightly.  Is a bit more refreshed when awakening.  2.  Depression:  Still very fatigued.  Still taking care of her mother, who has dementia.  Mom is cared fro at [facility] and they are aware of the difficulties she is having with her.  Also, a caregiver comes to the home to bathe her.  Mother allows cat in her room and cat defecates and urinates on bed and in room and mother also defecates and does not clean herself up.  She finds herself yelling at her mother due to her irritation with her mother.  The caregiver has told her she needs to be more patient.  Her irritability is improved on the Cymbalta, but still needs to improve per patient.   She has not really shared how to work on this with her counselor, [employer].   3.  Back/arm/leg pruritis:  Much better.  Is using Clarinex at night, but only has for 3 months as will no longer be carried at [facility].    4.  Allergies:  Nasonex has helped nasal allergy symptoms a lot. She will also be out of the [facility] coverage for this in mid September.  Again, also taking clarinex.  Stopped Singulair as she feels it made her too sleepy.  Has been off for about 1 month.  5.  Breast lumps:  Right axillary breast area.  Had another lump in inferior breast, but that resolved.  Some tenderness when she palpates the area.  No nipple discharge.  Has history of fibrocystic breast disease.     Current outpatient prescriptions:  .  acyclovir (ZOVIRAX) 400 MG tablet, 1 tab by mouth twice daily, Disp: 60 tablet, Rfl: 11 .  desloratadine (CLARINEX) 5 MG tablet, Take 1 tablet (5 mg total) by mouth daily., Disp: 30 tablet, Rfl: 11 .  DULoxetine (CYMBALTA) 60 MG capsule, Take 1 capsule (60 mg  total) by mouth daily., Disp: 30 capsule, Rfl: 11 .  ibuprofen (ADVIL,MOTRIN) 200 MG tablet, Take 800 mg by mouth every 6 (six) hours as needed (OTC)., Disp: , Rfl:  .  mometasone (NASONEX) 50 MCG/ACT nasal spray, 2 sprays each nostril daily, Disp: 17 g, Rfl: 11 .  cetirizine (ZYRTEC) 10 MG tablet, Take 10 mg by mouth daily. Reported on 06/14/2016, Disp: , Rfl:  .  gabapentin (NEURONTIN) 100 MG capsule, 1 cap by mouth at bedtime.  Increase every 3 days by 1 cap at bedtime to max of 300 mg (Patient not taking: Reported on 04/12/2016), Disp: 90 capsule, Rfl: 3 .  ibuprofen (ADVIL,MOTRIN) 600 MG tablet, Take 1 tablet (600 mg total) by mouth every 6 (six) hours as needed. (Patient not taking: Reported on 04/12/2016), Disp: 30 tablet, Rfl: 0 .  montelukast (SINGULAIR) 10 MG tablet, Take 1 tablet (10 mg total) by mouth at bedtime. (Patient not taking: Reported on 06/14/2016), Disp: 30 tablet, Rfl: 10   Allergies  Allergen Reactions  . Hydrocodone Nausea Only and Other (See Comments)    dizziness  . Ultram [Tramadol Hcl] Nausea Only  . Gabapentin Rash      Review of Systems     Objective:   Physical  Exam NAD HEENT;  PERRL, EOMI, nasal mucosa with mild swelling and clear discharge.  No posterior pharyngeal cobbling.  No throat injection. Neck:  Supple, no adenopathy Chest:  CTA Breasts:  Large breasts without skin dimpling or nipple discharge.  2 cm round, firm and seemingly fixed lump with mild to moderate tenderness in right high lateral breast near anterior axillary line.  Feels almost attached to rib here. CV:  RRR without murmur or rub Skin:  Very tanned.  No open lesion on back, though significant scarring from scratching.  Two open lesions on left arm.  Old healed lesions.       Assessment & Plan:  1.  Fibromyalgia:  Encouraged applying for [employer] scholarship to get in pool and swim/aquatic exercise.  2.  Depression:  Increase Cymbalta by 30 mg to 90 mg total once daily.  To call when  she obtains that increased dose and will have her follow up thereafter in 4 weeks. To discuss behavioral modification with her counselor, [name], for anger/irritability control.  3.  Skin pruritis:  Not clear why better.  Stopping Gabapentin may have helped, but getting on regular allergy medication may have as well (started mid June) Working on allergy referral appt. Still.  4.  Allergies controlled with Nasonex and Clarinex.  Not utilizing Montelukast as she feels it makes her sleepy.  5.  Right axillary breast mass:  This is concerning.  Called [facility]--she will be evaluated tomorrow morning.

## 2016-06-14 NOTE — Patient Instructions (Signed)
When you will be out of Clarinex and Nasonex in mid September, please switch to Allegra (Fexofenadine) 180 mg once daily and Flonase(Fluticasone) nasal spray 2 sprays each nostril daily.  Buy the store brands or generics.

## 2016-06-15 ENCOUNTER — Encounter (HOSPITAL_COMMUNITY): Payer: Self-pay

## 2016-06-15 ENCOUNTER — Other Ambulatory Visit (HOSPITAL_COMMUNITY): Payer: Self-pay | Admitting: Obstetrics and Gynecology

## 2016-06-15 ENCOUNTER — Ambulatory Visit
Admission: RE | Admit: 2016-06-15 | Discharge: 2016-06-15 | Disposition: A | Discharge status: Home / Self Care | Payer: No Typology Code available for payment source | Source: Ambulatory Visit | Attending: Obstetrics and Gynecology | Admitting: Obstetrics and Gynecology

## 2016-06-15 ENCOUNTER — Ambulatory Visit (HOSPITAL_COMMUNITY)
Admission: RE | Admit: 2016-06-15 | Discharge: 2016-06-15 | Disposition: A | Discharge status: Home / Self Care | Payer: Medicaid Other | Source: Ambulatory Visit | Attending: Obstetrics and Gynecology | Admitting: Obstetrics and Gynecology

## 2016-06-15 ENCOUNTER — Telehealth: Payer: Self-pay

## 2016-06-15 VITALS — BP 118/78 | Temp 98.7°F | Ht 64.0 in | Wt 224.0 lb

## 2016-06-15 DIAGNOSIS — N6311 Unspecified lump in the right breast, upper outer quadrant: Secondary | ICD-10-CM

## 2016-06-15 DIAGNOSIS — N644 Mastodynia: Secondary | ICD-10-CM | POA: Insufficient documentation

## 2016-06-15 DIAGNOSIS — N6310 Unspecified lump in the right breast, unspecified quadrant: Secondary | ICD-10-CM

## 2016-06-15 DIAGNOSIS — N63 Unspecified lump in breast: Secondary | ICD-10-CM | POA: Diagnosis present

## 2016-06-15 DIAGNOSIS — Z1239 Encounter for other screening for malignant neoplasm of breast: Secondary | ICD-10-CM

## 2016-06-15 DIAGNOSIS — R599 Enlarged lymph nodes, unspecified: Secondary | ICD-10-CM

## 2016-06-15 NOTE — Progress Notes (Signed)
Complaints of right breast lump x 3 months that is painful when touched. Patient rates pain at a 8 out of 10.  Pap Smear:  Pap smear not completed today. Last Pap smear was 06/08/2015 at the [facility] and normal. Per patient has no history of an abnormal Pap smear. Last Pap smear result is in EPIC.  Physical exam: Breasts Breasts symmetrical. No skin abnormalities bilateral breasts. No nipple retraction bilateral breasts. No nipple discharge bilateral breasts. No lymphadenopathy. No lumps palpated left breast. Palpated a lump within the right breast at 11 o'clock 8 cm from the nipple. Complaints of tenderness when palpated lump. Referred patient to the [facility] for diagnostic mammogram. Appointment scheduled for Thursday, June 15, 2016 at 1120.   Pelvic/Bimanual No Pap smear completed today since last Pap smear was 06/08/2015. Pap smear not indicated per [facility] guidelines.   Smoking History: Patient has never smoked.  Patient Navigation: Patient education provided. Access to services provided for patient through [facility] program.   Colorectal Cancer Screening: Patient had a colonoscopy completed in 2013. No complaints today.

## 2016-06-15 NOTE — Telephone Encounter (Addendum)
Called and informed about April [name] per [employer] interpreter. Patient stated that she was interested and was scheduled for 06/23/16 at 8:45 AM.

## 2016-06-15 NOTE — Patient Instructions (Signed)
Educational materials on self breast awareness given. Explained to April [name] that she did not need a Pap smear today due to last Pap smear was 06/08/2015. Let her know [facility] will cover Pap smears every 3 years unless has a history of abnormal Pap smears. Referred patient to the [facility] for diagnostic mammogram. Appointment scheduled for Thursday, June 15, 2016 at 1120. April [name] verbalized understanding.  [place], Arvil Chaco, RN 10:49 AM

## 2016-06-16 ENCOUNTER — Encounter (HOSPITAL_COMMUNITY): Payer: Self-pay | Admitting: *Deleted

## 2016-06-21 ENCOUNTER — Ambulatory Visit
Admission: RE | Admit: 2016-06-21 | Discharge: 2016-06-21 | Disposition: A | Discharge status: Home / Self Care | Payer: No Typology Code available for payment source | Source: Ambulatory Visit | Attending: Obstetrics and Gynecology | Admitting: Obstetrics and Gynecology

## 2016-06-21 ENCOUNTER — Other Ambulatory Visit (HOSPITAL_COMMUNITY): Payer: Self-pay | Admitting: Obstetrics and Gynecology

## 2016-06-21 DIAGNOSIS — N6310 Unspecified lump in the right breast, unspecified quadrant: Secondary | ICD-10-CM

## 2016-06-21 DIAGNOSIS — R599 Enlarged lymph nodes, unspecified: Secondary | ICD-10-CM

## 2016-06-21 DIAGNOSIS — C773 Secondary and unspecified malignant neoplasm of axilla and upper limb lymph nodes: Secondary | ICD-10-CM

## 2016-06-21 DIAGNOSIS — C50411 Malignant neoplasm of upper-outer quadrant of right female breast: Secondary | ICD-10-CM | POA: Insufficient documentation

## 2016-06-21 DIAGNOSIS — Z170 Estrogen receptor positive status [ER+]: Secondary | ICD-10-CM

## 2016-06-21 DIAGNOSIS — C50919 Malignant neoplasm of unspecified site of unspecified female breast: Secondary | ICD-10-CM | POA: Insufficient documentation

## 2016-06-22 ENCOUNTER — Telehealth: Payer: Self-pay

## 2016-06-22 NOTE — Telephone Encounter (Signed)
Called to remind of [place] appointment at 8:45 AM on 06/23/16. Left voice mail of when and where appointment is and a return number in case has any questions.

## 2016-06-23 ENCOUNTER — Ambulatory Visit: Payer: No Typology Code available for payment source

## 2016-06-23 ENCOUNTER — Ambulatory Visit: Payer: Self-pay

## 2016-06-23 ENCOUNTER — Encounter: Payer: Self-pay | Admitting: Internal Medicine

## 2016-06-23 ENCOUNTER — Telehealth: Payer: Self-pay | Admitting: *Deleted

## 2016-06-23 VITALS — BP 134/96 | HR 64 | Temp 97.8°F | Resp 18 | Ht 63.39 in | Wt 230.0 lb

## 2016-06-23 DIAGNOSIS — C50411 Malignant neoplasm of upper-outer quadrant of right female breast: Secondary | ICD-10-CM

## 2016-06-23 DIAGNOSIS — Z00129 Encounter for routine child health examination without abnormal findings: Secondary | ICD-10-CM

## 2016-06-23 LAB — LIPID PANEL
CHOLESTEROL TOTAL: 227 mg/dL — AB (ref 100–199)
Chol/HDL Ratio: 3 ratio units (ref 0.0–4.4)
HDL: 75 mg/dL (ref >39)
LDL CALC: 138 mg/dL — AB (ref 0–99)
TRIGLYCERIDES: 72 mg/dL (ref 0–149)
VLDL CHOLESTEROL CAL: 14 mg/dL (ref 5–40)

## 2016-06-23 NOTE — Telephone Encounter (Signed)
Left vm for pt to return call regarding [place] on 06/28/16. Contact information provided.

## 2016-06-23 NOTE — Progress Notes (Signed)
Patient is a new patient to the [facility] program and is currently a [facility] patient effective 06/15/2016.   Clinical Measurements: Patient is 5 ft. 3 inches, weight 230.1 lbs, and BMI 40.3.   Medical History: Patient has no history of high cholesterol. Patient does not have a history of hypertension or diabetes. Per patient no diagnosed history of coronary heart disease, heart attack, heart failure, stroke/TIA, vascular disease or congenital heart defects. Patient is scheduled Wednesday for initial appointment with Breast team at [facility] related to her positive biopsy.  Blood Pressure, Self-measurement: Patient states has no reason to check Blood pressure.  Nutrition Assessment: Patient stated that eats 1 to 2 fruits every day. Patient states she eats 2 servings of vegetables a day. Per patient does eat 3 or more ounces of whole grains daily. Patient does eat two or more servings of fish weekly. . Patient states she does not drink more than 36 ounces or 450 calories of beverages with added sugars weekly. Patient states she drinks a lot of water. Patient stated she does  watch her salt intake.   Physical Activity Assessment: Patient stated that she walks a lot on her job and lifts boxes. Patient stated has probably been doing 300 minutes of moderate exercise a week and no vigorous exercise.  Smoking Status: Patient stated that has never smoked and is not exposed to smoke.  Quality of Life Assessment: In assessing patient's physical quality of life she stated that out of the past 30 days that she has felt her health was not good for 15 days. Patient also stated that in the past 30 days that her mental health was not good including stress, depression and problems with emotions for 25 days. Patient states that has fibromyalgia. Patient did state that out of the past 30 days she felt her physical or mental health had kept her from doing her usual activities including self-care, work or recreation for  10 days.   Plan: Lab work will be done today including a lipid panel and Hgb A1C. Will call lab results when they are finished. Patient will receive Health Coaching for risk reduction initially and then as patient wants.  FIRST HEALTH COACH: During initial screening wellness health coaching was started.  Nutrition: Discussed with patient that normally should eat 2 to 3 servings of fruits and 2 to 21/2 of vegetables a day with a serving being 1/2 cup or small piece. Informed patient that two servings of fish were recommended per week, being ones with omega threes. Listed the fishes with omega three's for patient.Discussed that should have 5 to 7 ounces of fiber a day. Explained to patient that should eat fruits and vegetables high in fiber but 3 of her 5 to 7 grains of fiber should be whole grain. Examples of whole grains were given.   Informed about using less sugar which patient is doing. Discussed decreasing salt intake by mainly adding some while cook and none at table.  Physical Activity:  Patient was Informed that minimal moderate activity is 150 minutes a week or 75 minutes of vigorous. Patient is doing her minimal moderate exercise.  PLAN: Will set goal when call lab results and do second health coaching. Will think about what goals may want to set. Will follow for support during Cancer treatment.

## 2016-06-23 NOTE — Telephone Encounter (Signed)
Confirmed [facility] for 06/28/16 at 1215 .  Instructions and contact information given. 

## 2016-06-23 NOTE — Patient Instructions (Signed)
Discussed health assessment with patient. Discussed that may be would need stop by office on Wednesday and see April [name] about [place].  She will be called with results of lab work and we will then discussed any further follow up the patient needs. Patient verbalized understanding.

## 2016-06-24 LAB — HEMOGLOBIN A1C
ESTIMATED AVERAGE GLUCOSE: 108 mg/dL
Hemoglobin A1c: 5.4 % (ref 4.8–5.6)

## 2016-06-26 ENCOUNTER — Telehealth (HOSPITAL_COMMUNITY): Payer: Self-pay | Admitting: *Deleted

## 2016-06-26 ENCOUNTER — Telehealth: Payer: Self-pay

## 2016-06-26 NOTE — Telephone Encounter (Signed)
LAB RESULTS: Patient call to inform about lab work from 06/23/16. I informed patient: cholesterol- 227, HDL- 75, LDL- 138, triglycerides - 72,  HBG-A1C - 5.4, and BMI 40.3.  DOCTOR's APPOINTMENT: Discussed patient's doctor appointment for July 26. Asked if was having any headaches and decreasing blood pressure risk factors by decreasing salt intake and stress level.  HEALTH COACHING: Did risk reduction counseling/[employer] Coach concerning related to BMI/Weight, cholesterol and LDL's. Patient and I discussed her eating, not frying foods and exercise. Discussed serving sizes and reminded her of her WISEWOMAN overview sheet that received at screening. WISEWOMAN sheet has all food group minimal servings required for day for1600 calorie diet. Discussed exercise and since getting ready for Breast Cancer treatment not to make too many more changes or weight loss.  NAVIGATION NEEDS ASSESSMENT AND CARE PLAN: Patient does need additional social support and help with lack access to services.Patient understands why she has to be followed up for blood pressure. Only barrier may have been that patient is not aware of some services to assist the low income.  PLAN: Patient will attend doctor's appointment. Will call patient back after appointment to see how appointment went. Patient and I will discuss more health coaching concerning patients exercise and weight loss goals

## 2016-06-26 NOTE — Telephone Encounter (Signed)
Patient called and left voicemail to call her back. Called patient back and explained [facility] Medicaid to patient. Let her know will need to schedule a time to meet to have completed. Patient scheduled to meet me on Wednesday, June 28, 2016 at 1130 prior to appointment at [facility] to complete [facility] paperwork. Patient verbalized understanding.

## 2016-06-27 ENCOUNTER — Other Ambulatory Visit: Payer: Self-pay

## 2016-06-27 DIAGNOSIS — F329 Major depressive disorder, single episode, unspecified: Secondary | ICD-10-CM

## 2016-06-27 DIAGNOSIS — F32A Depression, unspecified: Secondary | ICD-10-CM

## 2016-06-27 MED ORDER — DULOXETINE HCL 60 MG PO CPEP: 60.0000 mg | ORAL_CAPSULE | Freq: Every day | ORAL | 11 refills | Status: DC

## 2016-06-27 NOTE — Telephone Encounter (Signed)
[facility] pharmacy would like a new rx sent saying to take the 60mg  Cymbalta along with the 30mg  Cymbalta for a total daily dose of 90mg . rx sent.

## 2016-06-28 ENCOUNTER — Encounter: Payer: Self-pay | Admitting: Physical Therapy

## 2016-06-28 ENCOUNTER — Telehealth (HOSPITAL_COMMUNITY): Payer: Self-pay | Admitting: *Deleted

## 2016-06-28 ENCOUNTER — Encounter: Payer: Self-pay | Admitting: Genetic Counselor

## 2016-06-28 ENCOUNTER — Other Ambulatory Visit (HOSPITAL_BASED_OUTPATIENT_CLINIC_OR_DEPARTMENT_OTHER): Payer: Medicaid Other

## 2016-06-28 ENCOUNTER — Ambulatory Visit (HOSPITAL_BASED_OUTPATIENT_CLINIC_OR_DEPARTMENT_OTHER): Payer: Medicaid Other | Admitting: Oncology

## 2016-06-28 ENCOUNTER — Ambulatory Visit
Admission: RE | Admit: 2016-06-28 | Discharge: 2016-06-28 | Disposition: A | Discharge status: Home / Self Care | Payer: Medicaid Other | Source: Ambulatory Visit | Attending: Radiation Oncology | Admitting: Radiation Oncology

## 2016-06-28 ENCOUNTER — Ambulatory Visit: Payer: Medicaid Other | Attending: General Surgery | Admitting: Physical Therapy

## 2016-06-28 ENCOUNTER — Encounter: Payer: Self-pay | Admitting: *Deleted

## 2016-06-28 ENCOUNTER — Other Ambulatory Visit: Payer: Self-pay | Admitting: *Deleted

## 2016-06-28 VITALS — BP 159/90 | HR 72 | Temp 98.7°F | Resp 18 | Ht 63.39 in | Wt 228.6 lb

## 2016-06-28 DIAGNOSIS — C773 Secondary and unspecified malignant neoplasm of axilla and upper limb lymph nodes: Secondary | ICD-10-CM | POA: Diagnosis not present

## 2016-06-28 DIAGNOSIS — C50411 Malignant neoplasm of upper-outer quadrant of right female breast: Secondary | ICD-10-CM

## 2016-06-28 DIAGNOSIS — R293 Abnormal posture: Secondary | ICD-10-CM

## 2016-06-28 DIAGNOSIS — Z170 Estrogen receptor positive status [ER+]: Secondary | ICD-10-CM | POA: Diagnosis not present

## 2016-06-28 LAB — COMPREHENSIVE METABOLIC PANEL
ALBUMIN: 3.5 g/dL (ref 3.5–5.0)
ALK PHOS: 131 U/L (ref 40–150)
ALT: 16 U/L (ref 0–55)
ANION GAP: 9 meq/L (ref 3–11)
AST: 17 U/L (ref 5–34)
BILIRUBIN TOTAL: 0.35 mg/dL (ref 0.20–1.20)
BUN: 15.6 mg/dL (ref 7.0–26.0)
CALCIUM: 9.1 mg/dL (ref 8.4–10.4)
CO2: 20 meq/L — AB (ref 22–29)
CREATININE: 0.8 mg/dL (ref 0.6–1.1)
Chloride: 109 mEq/L (ref 98–109)
EGFR: 82 mL/min/{1.73_m2} — ABNORMAL LOW (ref >90)
Glucose: 100 mg/dl (ref 70–140)
Potassium: 4.1 mEq/L (ref 3.5–5.1)
Sodium: 138 mEq/L (ref 136–145)
TOTAL PROTEIN: 7.5 g/dL (ref 6.4–8.3)

## 2016-06-28 LAB — CBC WITH DIFFERENTIAL/PLATELET
BASO%: 0.4 % (ref 0.0–2.0)
Basophils Absolute: 0 10*3/uL (ref 0.0–0.1)
EOS ABS: 0.1 10*3/uL (ref 0.0–0.5)
EOS%: 2 % (ref 0.0–7.0)
HEMATOCRIT: 41 % (ref 34.8–46.6)
HEMOGLOBIN: 14 g/dL (ref 11.6–15.9)
LYMPH#: 1.1 10*3/uL (ref 0.9–3.3)
LYMPH%: 22.1 % (ref 14.0–49.7)
MCH: 32.6 pg (ref 25.1–34.0)
MCHC: 34.1 g/dL (ref 31.5–36.0)
MCV: 95.3 fL (ref 79.5–101.0)
MONO#: 0.4 10*3/uL (ref 0.1–0.9)
MONO%: 8.3 % (ref 0.0–14.0)
NEUT%: 67.2 % (ref 38.4–76.8)
NEUTROS ABS: 3.3 10*3/uL (ref 1.5–6.5)
PLATELETS: 277 10*3/uL (ref 145–400)
RBC: 4.3 10*6/uL (ref 3.70–5.45)
RDW: 13.9 % (ref 11.2–14.5)
WBC: 4.9 10*3/uL (ref 3.9–10.3)

## 2016-06-28 NOTE — Progress Notes (Signed)
Radiation Oncology         [phone] ________________________________  Initial outpatient Consultation  Name: April [name] MRN: [id number]  Date: 06/28/2016  DOB: [birth year]  XT:KWIOXBDZ,HGDJMEQAS, MD  Excell Seltzer, MD   REFERRING PHYSICIAN: Excell Seltzer, MD  DIAGNOSIS:    ICD-9-CM ICD-10-CM   1. 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 female who presented with a palpable right breast mass for 3 months. On ultrasound this measured 2.8 cm at the 10 o'clock position. On ultrasound the right axilla revealed multiple lymph nodes that were abnormal, the largest was 2.2 cm. Core needle biopsy of a lymph node was positive and the right breast mass revealed invasive ductal carcinoma with characteristics as described above in the diagnosis.   She is not currently working. She is accompanied by her daughter. She has not menstruated for years.  PREVIOUS RADIATION THERAPY: No  PAST MEDICAL HISTORY:  has a past medical history of Allergy; Anxiety; Arthritis; Bilateral ankle fractures (07/2015); Depression; Fibromyalgia (2013); Genital herpes (2005); Migraine; Obesity; and Right wrist fracture (06/2015).    PAST SURGICAL HISTORY:No past surgical history on file.  FAMILY HISTORY: family history includes Arthritis in her mother; Bipolar disorder in her daughter; Cancer in her father; Cerebral aneurysm in her father; Dementia in her mother; Depression in her daughter; Emphysema in her father; Berenice Primas' disease in her sister; Heart disease in her mother; Hypertension in her mother; Irritable bowel syndrome in her mother; Mental illness in her brother, brother, and sister; Vitiligo in her sister.  SOCIAL HISTORY:  reports that she quit smoking about 22 years ago. She has never used smokeless tobacco. She  reports that she drinks about 1.2 - 2.4 oz of alcohol per week . She reports that she uses drugs, including Marijuana.  ALLERGIES: Hydrocodone; Ultram [tramadol hcl]; and Gabapentin  MEDICATIONS:  Current Outpatient Prescriptions  Medication Sig Dispense Refill  . acyclovir (ZOVIRAX) 400 MG tablet 1 tab by mouth twice daily 60 tablet 11  . desloratadine (CLARINEX) 5 MG tablet Take 1 tablet (5 mg total) by mouth daily. 30 tablet 11  . DULoxetine (CYMBALTA) 30 MG capsule 1 cap by mouth with 60  mg cap once daily for total of 90 mg daily (Patient taking differently: 90 mg. 1 cap by mouth with 60  mg cap once daily for total of 90 mg daily) 30 capsule 11  . DULoxetine (CYMBALTA) 60 MG capsule Take 1 capsule (60 mg total) by mouth daily. Take along with '30mg'$  capsule for a total of '90mg'$  of cymbalta per day. 30 capsule 11  . ibuprofen (ADVIL,MOTRIN) 200 MG tablet Take 800 mg by mouth every 6 (six) hours as needed (OTC).    Marland Kitchen ibuprofen (ADVIL,MOTRIN) 600 MG tablet Take 1 tablet (600 mg total) by mouth every 6 (six) hours as needed. 30 tablet 0  . mometasone (NASONEX) 50 MCG/ACT nasal spray 2 sprays each nostril daily 17 g 11   No current facility-administered medications for this encounter.     REVIEW OF SYSTEMS: 15 point review of systems was obtained by nursing and reviewed by me. Notable for glasses; headaches - migraines, chronic; ruptured disc, forgetfulness, anxiety, depression, hot flashes, skin rash, breast lump, heartburn, shortness of breath with walking and stairs, and fatigue.   PHYSICAL EXAM:  Vitals with BMI 06/28/2016  Height 5' 3.39"  Weight 228 lbs 10 oz  BMI 73.4  Systolic 193  Diastolic 90  Pulse 72  Respirations 18    General: Alert and oriented, in no acute distress HEENT: Head is normocephalic. Extraocular movements are intact. Oropharynx is clear. Neck: Neck is supple, no palpable cervical or supraclavicular lymphadenopathy. Heart: Regular in rate and rhythm with no  murmurs, rubs, or gallops. Chest: Clear to auscultation bilaterally, with no rhonchi, wheezes, or rales. Abdomen: Soft, nontender, nondistended, with no rigidity or guarding. Extremities: No cyanosis or edema. Lymphatics: see Neck Exam Skin: No concerning lesions. Psychiatric: Judgment and insight are intact. Affect is appropriate. Breasts: Upper outer quadrant of the right breast there is a palpable mass approximately 2.5 cm in greatest dimension. No palpable masses appreciated in the left breast or axillae bilaterally. Neuro: no obvious focalities   ECOG = 0  0 - Asymptomatic (Fully active, able to carry on all predisease activities without restriction)  1 - Symptomatic but completely ambulatory (Restricted in physically strenuous activity but ambulatory and able to carry out work of a light or sedentary nature. For example, light housework, office work)  2 - Symptomatic, <50% in bed during the day (Ambulatory and capable of all self care but unable to carry out any work activities. Up and about more than 50% of waking hours)  3 - Symptomatic, >50% in bed, but not bedbound (Capable of only limited self-care, confined to bed or chair 50% or more of waking hours)  4 - Bedbound (Completely disabled. Cannot carry on any self-care. Totally confined to bed or chair)  5 - Death   Eustace Pen MM, Creech RH, Tormey DC, et al. [phone]). "Toxicity and response criteria of the [facility]". [place] Oncol. 5 (6): 649-55   LABORATORY DATA:  Lab Results  Component Value Date   WBC 4.9 06/28/2016   HGB 14.0 06/28/2016   HCT 41.0 06/28/2016   MCV 95.3 06/28/2016   PLT 277 06/28/2016   CMP     Component Value Date/Time   NA 138 06/28/2016 1139   K 4.1 06/28/2016 1139   CL 108 07/14/2015 0544   CO2 20 (L) 06/28/2016 1139   GLUCOSE 100 06/28/2016 1139   BUN 15.6 06/28/2016 1139   CREATININE 0.8 06/28/2016 1139   CALCIUM 9.1 06/28/2016 1139   PROT 7.5 06/28/2016 1139     ALBUMIN 3.5 06/28/2016 1139   AST 17 06/28/2016 1139   ALT 16 06/28/2016 1139   ALKPHOS 131 06/28/2016 1139   BILITOT 0.35 06/28/2016 1139   GFRNONAA >60 07/14/2015 0544   GFRAA >60 07/14/2015 0544         RADIOGRAPHY: Mm Digital Diagnostic Unilat R  Result Date: 06/21/2016 CLINICAL DATA:  Status post ultrasound-guided core biopsy of mass in the 10 o'clock location of the right breast and enlarged right axillary lymph node. EXAM: DIAGNOSTIC RIGHT MAMMOGRAM POST ULTRASOUND BIOPSY x2 COMPARISON:  Previous exam(s). FINDINGS: Mammographic images were obtained following ultrasound guided biopsy of mass in the 10 o'clock location of the right breast. A ribbon shaped clip is identified in the upper-outer quadrant of the right breast within an irregular mass previously evaluated. Within an enlarged right axillary lymph node, a coil shaped HydroMARK clip is present. IMPRESSION: Tissue marker clips are in expected locations following biopsy. Final Assessment: Post Procedure Mammograms for Marker Placement Electronically Signed   By: Nolon Nations M.D.   On: 06/21/2016 14:39   US  Breast Ltd Uni Right Inc Axilla  Result Date: 06/15/2016 CLINICAL DATA:  Mass felt by the patient in the 11 o'clock position of the right breast for the past 3 months. EXAM: 2D DIGITAL DIAGNOSTIC BILATERAL MAMMOGRAM WITH CAD AND ADJUNCT TOMO ULTRASOUND RIGHT BREAST COMPARISON:  Previous exam(s). ACR Breast Density Category b: There are scattered areas of fibroglandular density. FINDINGS: Multiple rounded and oval, circumscribed masses are again demonstrated in both breasts. These demonstrate overall improvement with a significant decrease in size of multiple masses. There is an interval oval, lobulated mass with some mildly ill-defined margins and mild architectural distortion in the posterior aspect of the upper-outer right breast, corresponding to the palpable mass, marked with a metallic marker. There are also multiple  interval enlarged right axillary lymph nodes. No findings on the left suspicious for malignancy. Mammographic images were processed with CAD. On physical exam, there is an approximately 2.5 cm firm, rounded, palpable mass in the 10 o'clock position of the right breast, 12 cm from the nipple. There are no palpable right axillary lymph nodes. Targeted ultrasound is performed, showing a 2.8 x 2.7 x 2.5 cm oval, irregular mass with poorly defined margins in the 10 o'clock position of the right breast, 12 cm from the nipple. This is irregular and hypoechoic centrally and echogenic peripherally. This corresponds to the palpable mass, seen mammographically. Ultrasound of the right axilla demonstrated multiple abnormal appearing right axillary lymph nodes. The majority have diffuse cortical thickening with compression her loss of the normal fatty hilum. One has eccentric cortical thickening. The largest measures 2.2 x 1.8 x 1.8 cm. IMPRESSION: 1. 2.8 cm mass with imaging features highly suspicious for malignancy in the 10 o'clock position of the right breast, 12 cm from the nipple. 2. Multiple abnormal right axillary lymph nodes compatible with metastatic lymph nodes. RECOMMENDATION: Ultrasound-guided core needle biopsies of the 2.8 cm mass in the 10 o'clock position of the right breast and the largest abnormal appearing right axillary lymph node. These have been scheduled at 1:30 p.m. on 06/21/2016. I have discussed the findings and recommendations with the patient. Results were also provided in writing at the conclusion of the visit. If applicable, a reminder letter will be sent to the patient regarding the next appointment. BI-RADS CATEGORY  5: Highly suggestive of malignancy. Electronically Signed   By: Claudie Revering M.D.   On: 06/15/2016 11:31   Mm Diag Breast Tomo Bilateral  Result Date: 06/15/2016 CLINICAL DATA:  Mass felt by the patient in the 11 o'clock position of the right breast for the past 3 months. EXAM:  2D DIGITAL DIAGNOSTIC BILATERAL MAMMOGRAM WITH CAD AND ADJUNCT TOMO ULTRASOUND RIGHT BREAST COMPARISON:  Previous exam(s). ACR Breast Density Category b: There are scattered areas of fibroglandular density. FINDINGS: Multiple rounded and oval, circumscribed masses are again demonstrated in both breasts. These demonstrate overall improvement with a significant decrease in size of multiple masses. There is an interval oval, lobulated mass with some mildly ill-defined margins and mild architectural distortion in the posterior aspect of the upper-outer right breast, corresponding to the palpable mass, marked with a metallic marker. There are also multiple interval enlarged right axillary lymph nodes. No findings on the left suspicious for malignancy. Mammographic images were processed with CAD. On physical exam, there is an approximately 2.5 cm firm, rounded, palpable mass in the 10 o'clock position of the right breast, 12 cm from the nipple. There are no palpable right axillary lymph nodes. Targeted ultrasound is performed, showing a 2.8  x 2.7 x 2.5 cm oval, irregular mass with poorly defined margins in the 10 o'clock position of the right breast, 12 cm from the nipple. This is irregular and hypoechoic centrally and echogenic peripherally. This corresponds to the palpable mass, seen mammographically. Ultrasound of the right axilla demonstrated multiple abnormal appearing right axillary lymph nodes. The majority have diffuse cortical thickening with compression her loss of the normal fatty hilum. One has eccentric cortical thickening. The largest measures 2.2 x 1.8 x 1.8 cm. IMPRESSION: 1. 2.8 cm mass with imaging features highly suspicious for malignancy in the 10 o'clock position of the right breast, 12 cm from the nipple. 2. Multiple abnormal right axillary lymph nodes compatible with metastatic lymph nodes. RECOMMENDATION: Ultrasound-guided core needle biopsies of the 2.8 cm mass in the 10 o'clock position of the  right breast and the largest abnormal appearing right axillary lymph node. These have been scheduled at 1:30 p.m. on 06/21/2016. I have discussed the findings and recommendations with the patient. Results were also provided in writing at the conclusion of the visit. If applicable, a reminder letter will be sent to the patient regarding the next appointment. BI-RADS CATEGORY  5: Highly suggestive of malignancy. Electronically Signed   By: Claudie Revering M.D.   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 This was found to be concordant by Dr. Nolon Nations. Pathology results were discussed with the patient by telephone. The patient reported doing well after the biopsy. Post biopsy instructions and care were reviewed and questions were answered. The patient was encouraged to call [facility] for any additional concerns. The patient was referred to the [facility] at the [facility] on June 28, 2016. Pathology results reported by Susa Raring RN, BSN on June 23, 2016. Electronically Signed   By: Nolon Nations M.D.   On: 06/23/2016 07:36  Result Date: 06/23/2016 CLINICAL DATA:  Patient presents for ultrasound-guided core biopsy of mass within the 10 o'clock location of the right breast and enlarged right axillary lymph node. EXAM: ULTRASOUND GUIDED RIGHT BREAST CORE NEEDLE BIOPSY x2 COMPARISON:  Previous exam(s). FINDINGS: I met with the patient and we discussed the procedure of ultrasound-guided biopsy, including benefits and alternatives. We discussed the high likelihood of a successful procedure. We discussed the risks of the procedure, including infection, bleeding, tissue injury, clip  migration, and inadequate sampling. Informed written consent was given. The usual time-out protocol was performed immediately prior to the procedure. Using sterile technique and 1% Lidocaine as local anesthetic, under direct ultrasound visualization, a 12 gauge spring-loaded device was used to perform biopsy of mass in the 10 o'clock location of the right breast 15 cm from the nipple using a lateral approach. At the conclusion of the procedure a ribbon shaped tissue marker clip was deployed into the biopsy cavity. Using the same technique, a 14 gauge spring-loaded device was used to perform biopsy of enlarged lower right axillary lymph node using a lateral approach. At the conclusion of the procedure a coil shaped tissue marker clip was deployed into the biopsy cavity. Follow up 2 view mammogram was performed and dictated separately. IMPRESSION: Ultrasound guided biopsy of right breast mass and enlarged right  axillary lymph node. No apparent complications. Electronically Signed: By: Norva Pavlov M.D. 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 This was found to be concordant by Dr. Norva Pavlov. Pathology results were discussed with the patient by telephone. The patient reported doing well after the biopsy. Post biopsy instructions and care were reviewed and questions were answered. The patient was encouraged to call [facility] for any additional concerns. The patient was referred to the [facility] at the [facility] on June 28, 2016. Pathology results reported by Sonnie Alamo RN, BSN on June 23, 2016. Electronically Signed   By: Norva Pavlov M.D.   On: 06/23/2016  07:36  Result Date: 06/23/2016 CLINICAL DATA:  Patient presents for ultrasound-guided core biopsy of mass within the 10 o'clock location of the right breast and enlarged right axillary lymph node. EXAM: ULTRASOUND GUIDED RIGHT BREAST CORE NEEDLE BIOPSY x2 COMPARISON:  Previous exam(s). FINDINGS: I met with the patient and we discussed the procedure of ultrasound-guided biopsy, including benefits and alternatives. We discussed the high likelihood of a successful procedure. We discussed the risks of the procedure, including infection, bleeding, tissue injury, clip migration, and inadequate sampling. Informed written consent was given. The usual time-out protocol was performed immediately prior to the procedure. Using sterile technique and 1% Lidocaine as local anesthetic, under direct ultrasound visualization, a 12 gauge spring-loaded device was used to perform biopsy of mass in the 10 o'clock location of the right breast 15 cm from the nipple using a lateral approach. At the conclusion of the procedure a ribbon shaped tissue marker clip was deployed into the biopsy cavity. Using the same technique, a 14 gauge spring-loaded device was used to perform biopsy of enlarged lower right axillary lymph node using a lateral approach. At the conclusion of the procedure a coil shaped tissue marker clip was deployed into the biopsy cavity. Follow up 2 view mammogram was performed and dictated separately. IMPRESSION: Ultrasound guided biopsy of right breast mass and enlarged right axillary lymph node. No apparent complications. Electronically Signed: By: Norva Pavlov M.D. On: 06/21/2016 14:11      IMPRESSION/PLAN: Stage IIB right breast cancer, ER/PR positive, Her2 negative.  She has been discussed at our multidisciplinary tumor board.  The consensus is that she would be a good candidate for breast conservation. I talked to her about the option of a mastectomy and informed her that her expected overall survival would  be equivalent between mastectomy and breast conservation, based upon randomized controlled data. She is enthusiastic about breast conservation.  Per tumor board discussion, plan is for neoadjuvant chemotherapy followed by breast conservation surgery followed by adjuvant radiotherapy. She will have staging chest CT and bone scan. I will be happy to see her back when the surgeon is ready.  It was a pleasure meeting the patient today. We discussed the risks, benefits, and side effects of radiotherapy. I recommend radiotherapy to the regional nodes and right breast to reduce her risk of locoregional recurrence by 2/3.  We discussed that radiation would take approximately 6-7 weeks to complete and that I would give the patient a few weeks to heal following surgery before starting treatment planning. If  chemotherapy were to be given, this would precede radiotherapy. We spoke about acute effects including skin irritation and fatigue as well as much less common late effects including internal organ injury or irritation. We spoke about the latest technology that is used to minimize the risk of late effects for patients undergoing radiotherapy to the breast or chest wall. No guarantees of treatment were given. The patient is enthusiastic about proceeding with treatment. I look forward to participating in the patient's care.   __________________________________________   [name], MD    This document serves as a record of services personally performed by [name], MD. It was created on her behalf by Lendon Collar, a trained medical scribe. The creation of this record is based on the scribe's personal observations and the provider's statements to them. This document has been checked and approved by the attending provider.
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
## 2016-06-28 NOTE — Telephone Encounter (Signed)
Completed BCCCP Medicaid with patient prior to appointment at [facility]. Faxed Medicaid paperwork to DSS at 1640 today.

## 2016-06-28 NOTE — Patient Instructions (Signed)

## 2016-06-29 ENCOUNTER — Telehealth (HOSPITAL_COMMUNITY): Payer: Self-pay | Admitting: Vascular Surgery

## 2016-06-29 ENCOUNTER — Telehealth: Payer: Self-pay | Admitting: Oncology

## 2016-06-29 NOTE — Telephone Encounter (Signed)
Left pt message to make NP brst appt w/ echo 

## 2016-06-29 NOTE — Telephone Encounter (Signed)
Spoke with pt to confirm 8/1 and 8/11 appt date/times

## 2016-06-29 NOTE — Progress Notes (Signed)
[facility] Directives Clinical Social Work  Clinical Social Work was referred by Bary Castilla, navigator, to review and complete healthcare advance directives.  Clinical Social Worker met with patient and patient's daughter, Jodi Mourning, in [place] office.  The patient designated [name], as their primary healthcare agent and Kern Alberta, as their secondary agent.  Patient also completed healthcare living will.    Clinical Social Worker notarized documents and made copies for patient/family. Clinical Social Worker will send documents to medical records to be scanned into patient's chart. Clinical Social Worker encouraged patient/family to contact with any additional questions or concerns.  Polo Riley, MSW, [place] Worker [facility] [phone]

## 2016-06-30 ENCOUNTER — Encounter: Payer: Self-pay | Admitting: General Practice

## 2016-06-30 NOTE — Progress Notes (Signed)
[facility] Psychosocial Distress Screening Spiritual Care  Spiritual Care was referred by distress screening protocol.  The patient scored a 7 on the Psychosocial Distress Thermometer which indicates severe distress. Missed pt in Breast Multidisciplinary Clinic, so reached Ms April [name] by phone to assess for distress and other psychosocial needs.   ONCBCN DISTRESS SCREENING 06/30/2016  Screening Type Initial Screening  Distress experienced in past week (1-10) 7  Emotional problem type Depression;Nervousness/Anxiety  Information Concerns Type Lack of info about diagnosis;Lack of info about treatment  Physical Problem type Loss of appetitie  Referral to clinical social work Yes  Referral to support programs Yes  Other Spiritual Care, [place] Guide per request   Ms April [name] reports that, following [place], she is feeling "optimistic" about prognosis and is feeling less fatigued than during peak distress after dx.  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  Per pt, she is married (hx separation), but husband lives in [place], visiting frequently.  Per pt, she has no car unless he is in town, so she may need to take bus to chemo.  (Per pt, husband has hx that makes it difficult to find job, as well as newly diagnosed kidney disease.) Pt identified lack of financial resources as a top stressor.  Some local support, including church (which she has not visited for some time); pt plans to use [employer] to share private updates.  Provided emotional support, normalization of feelings, and introduction to [facility] team/resources.   Follow up needed: Yes.  Referring to LCSW for financial resources and to [employer] for support per pt request.  Pt aware of ongoing Support Team availability, but please also page as needs arise.  Thank you.  [place], [place], [facility] Pager [phone] Voicemail [phone]

## 2016-07-01 NOTE — Progress Notes (Signed)
[place]  Telephone:(336) [phone] Fax:(336) [phone]     ID: April [name] DOB: 1961/04/21  MR#: [id number]  FIE#:[id number]  Patient Care Team: [name], MD as PCP - General (Internal Medicine) Ephriam Jenkins, MD as Referring Physician (Allergy) Excell Seltzer, MD as Consulting Physician (General Surgery) Chauncey Cruel, MD as Consulting Physician (Oncology) [name], MD as Attending Physician (Radiation Oncology) OTHER MD:  CHIEF COMPLAINT: Estrogen receptor positive breast cancer  CURRENT TREATMENT: Neoadjuvant chemotherapy   BREAST CANCER HISTORY: April [name] herself noted a change in her right breast sometime in March or April 2017. She has a history of fibrocystic change and even though she saw her primary physician in the interval she forgot to mention the mass. She did mention that when she went for routinely scheduled mammography at the [facility] 06/15/2016, so she was changed from screening 2 diagnostic bilateral mammography with tomography and right breast ultrasonography. This found the breast density to be category B. The patient does have multiple masses in both breasts which were largely unchanged from prior. However there was an interval lobulated mass with ill-defined margins in the upper outer quadrant of the right breast, which was palpable. There were also multiple enlarged right axillary lymph nodes.  On exam there was a 2.5 cm firm rounded palpable mass at the 10:00 position of the right breast 12 cm from the nipple. There was no palpable axillary adenopathy. Ultrasonography confirmed a 2.8 cm irregular mass in the upper outer quadrant of the right breast. By ultrasound also there were multiple abnormal appearing right axillary lymph nodes, with diffuse cortical thickening. The largest measured 2.2 cm.  Biopsy of the right breast mass and a right axillary lymph node 06/21/2016 showed ([id number]) both biopsies to be positive for  invasive ductal carcinoma, grade 3, estrogen receptor positive at 95-100%, progesterone receptor positive at 80-90%, both with strong staining intensity, with an MIB-1 of 20-25%, and no HER-2 amplification, the signals ratio being 0.67-1.13, and the number per cell 1.20-2.25.  Her subsequent history is as detailed below  INTERVAL HISTORY: April [name] was evaluated in the multidisciplinary breast cancer clinic 06/28/2016 accompanied by her daughter April [name]. Her case was also presented in the multidisciplinary breast cancer conference that same morning. At that time a preliminary plan was proposed: Staging studies, neoadjuvant chemotherapy, then definitive surgery with consideration of targeted axillary dissection versus participation in the Alliance trial. She would need adjuvant radiation and anti-estrogens as well.  REVIEW OF SYSTEMS: Aside from the mass itself, there were no specific symptoms leading to the diagnostic mammogram, which was routinely scheduled. The patient denies unusual headaches, visual changes, nausea, vomiting, stiff neck, dizziness, or gait imbalance. There has been no cough, phlegm production, or pleurisy, no chest pain or pressure, and no change in bowel or bladder habits. The patient denies fever, rash, bleeding, or unexplained weight loss. The patient does describe herself as moderately fatigued. She gets short of breath when walking upstairs. She has heartburn problems. She has chronic low back pain. He has occasional headaches, and describes herself is forgetful, anxious and depressed. She has hot flashes during the day and sometimes they wake her up at night. A detailed review of systems was otherwise noncontributory  PAST MEDICAL HISTORY: Past Medical History:  Diagnosis Date  . Allergy   . Anxiety   . Arthritis   . Bilateral ankle fractures 07/2015   [place]  . Depression    Multiple  episodes  in past.  . Fibromyalgia 2013  diagnosed by Dr. [name]  . Genital herpes  2005   Has outbreaks monthly  . Migraine   . Obesity   . Right wrist fracture 06/2015    PAST SURGICAL HISTORY: History reviewed. No pertinent surgical history.  FAMILY HISTORY Family History  Problem Relation Age of Onset  . Arthritis Mother   . Hypertension Mother   . Heart disease Mother   . Dementia Mother   . Irritable bowel syndrome Mother   . Emphysema Father   . Cancer Father     bladder  . Cerebral aneurysm Father     ruptured aneurysm was cause of death  . Bipolar disorder Daughter     Not clear if this is the case.  Possibly Bipolar II  . Depression Daughter   . Graves' disease Sister   . Vitiligo Sister   . Mental illness Brother     Depression  . Mental illness Sister     likely undiagnosed schizophrenia  . Mental illness Brother     Schizophrenia  The patient's father died from a ruptured brain aneurysm at the age of 54. He also had a history of bladder cancer. He was a smoker. The patient's mother is living, age 59 as of July 2017. The patient had 2 brothers, 2 sisters. There is no history of breast or ovarian cancer in the family.  GYNECOLOGIC HISTORY:  No LMP recorded. Patient is postmenopausal. Menarche age 53, first live birth age 11, the patient understands increases the risk of breast cancer. The patient stopped having menses June 2012. She did not use hormone replacement. She didn't take oral contraceptives for approximately 9 years remotely, with no complications.  SOCIAL HISTORY:  April [name] lives with her mother. She tells me she is the primary caregiver to her mother with Alzheimer's disease. The patient is not employed. The patient's husband [name] generally lives in [place] with his parents. He sometimes stays with the patient. She tells me he is a felon and this makes it hard for him to find a job. The patient's daughter, April [name], lives in [place] where she works as a Chemical engineer for [employer]. The patient has no grandchildren. She is  a Psychologist, forensic.    ADVANCED DIRECTIVES: Not in place. At the 06/28/2016 visit the patient was given advanced directives to complete and notarize at her discretion.April [name]  HEALTH MAINTENANCE: Social History  Substance Use Topics  . Smoking status: Former Smoker    Quit date: 01/21/1994  . Smokeless tobacco: Never Used  . Alcohol use 1.2 - 2.4 oz/week    2 - 4 Standard drinks or equivalent per week     Comment: Couple beers or wine a week.     Colonoscopy:  PAP:  Bone density:   Allergies  Allergen Reactions  . Hydrocodone Nausea Only and Other (See Comments)    dizziness  . Ultram [Tramadol Hcl] Nausea Only  . Gabapentin Rash    Current Outpatient Prescriptions  Medication Sig Dispense Refill  . acyclovir (ZOVIRAX) 400 MG tablet 1 tab by mouth twice daily 60 tablet 11  . desloratadine (CLARINEX) 5 MG tablet Take 1 tablet (5 mg total) by mouth daily. 30 tablet 11  . DULoxetine (CYMBALTA) 30 MG capsule 1 cap by mouth with 60  mg cap once daily for total of 90 mg daily (Patient taking differently: 90 mg. 1 cap by mouth with 60  mg cap once daily for total of 90 mg daily) 30 capsule 11  . DULoxetine (  CYMBALTA) 60 MG capsule Take 1 capsule (60 mg total) by mouth daily. Take along with '30mg'$  capsule for a total of '90mg'$  of cymbalta per day. 30 capsule 11  . ibuprofen (ADVIL,MOTRIN) 200 MG tablet Take 800 mg by mouth every 6 (six) hours as needed (OTC).    April [name] ibuprofen (ADVIL,MOTRIN) 600 MG tablet Take 1 tablet (600 mg total) by mouth every 6 (six) hours as needed. 30 tablet 0  . mometasone (NASONEX) 50 MCG/ACT nasal spray 2 sprays each nostril daily 17 g 11   No current facility-administered medications for this visit.     OBJECTIVE: Middle-aged white woman who appears stated age 36:   06/28/16 1251  BP: (!) 159/90  Pulse: 72  Resp: 18  Temp: 98.7 F (37.1 C)     Body mass index is 40 kg/m.    ECOG FS:1 - Symptomatic but completely ambulatory Filed Weights   06/28/16 1251  Weight:  228 lb 9.6 oz (103.7 kg)   Ocular: Sclerae unicteric, pupils equal, round and reactive to light Ear-nose-throat: Oropharynx clear and moist Lymphatic: No cervical or supraclavicular adenopathy Lungs no rales or rhonchi, good excursion bilaterally Heart regular rate and rhythm, no murmur appreciated Abd soft, obese, nontender, positive bowel sounds MSK no focal spinal tenderness, no joint edema Neuro: non-focal, well-oriented, appropriate affect Breasts: The right breast is status post recent biopsy. There is a small ecchymosis. There is a palpable mass in the upper outer quadrant of the breast, which is movable. I do not palpate right axillary adenopathy. The left breast is unremarkable.   LAB RESULTS:  CMP     Component Value Date/Time   NA 138 06/28/2016 1139   K 4.1 06/28/2016 1139   CL 108 07/14/2015 0544   CO2 20 (L) 06/28/2016 1139   GLUCOSE 100 06/28/2016 1139   BUN 15.6 06/28/2016 1139   CREATININE 0.8 06/28/2016 1139   CALCIUM 9.1 06/28/2016 1139   PROT 7.5 06/28/2016 1139   ALBUMIN 3.5 06/28/2016 1139   AST 17 06/28/2016 1139   ALT 16 06/28/2016 1139   ALKPHOS 131 06/28/2016 1139   BILITOT 0.35 06/28/2016 1139   GFRNONAA >60 07/14/2015 0544   GFRAA >60 07/14/2015 0544    INo results found for: SPEP, UPEP  Lab Results  Component Value Date   WBC 4.9 06/28/2016   NEUTROABS 3.3 06/28/2016   HGB 14.0 06/28/2016   HCT 41.0 06/28/2016   MCV 95.3 06/28/2016   PLT 277 06/28/2016      Chemistry      Component Value Date/Time   NA 138 06/28/2016 1139   K 4.1 06/28/2016 1139   CL 108 07/14/2015 0544   CO2 20 (L) 06/28/2016 1139   BUN 15.6 06/28/2016 1139   CREATININE 0.8 06/28/2016 1139      Component Value Date/Time   CALCIUM 9.1 06/28/2016 1139   ALKPHOS 131 06/28/2016 1139   AST 17 06/28/2016 1139   ALT 16 06/28/2016 1139   BILITOT 0.35 06/28/2016 1139       No results found for: LABCA2  No components found for: LABCA125  No results for  input(s): INR in the last 168 hours.  Urinalysis    Component Value Date/Time   BILIRUBINUR negative 07/02/2012 1729   PROTEINUR negative 07/02/2012 1729   UROBILINOGEN 0.2 07/02/2012 1729   NITRITE negative 07/02/2012 1729   LEUKOCYTESUR Negative 07/02/2012 1729     STUDIES: Mm Digital Diagnostic Unilat R  Result Date: 06/21/2016 CLINICAL DATA:  Status post  ultrasound-guided core biopsy of mass in the 10 o'clock location of the right breast and enlarged right axillary lymph node. EXAM: DIAGNOSTIC RIGHT MAMMOGRAM POST ULTRASOUND BIOPSY x2 COMPARISON:  Previous exam(s). FINDINGS: Mammographic images were obtained following ultrasound guided biopsy of mass in the 10 o'clock location of the right breast. A ribbon shaped clip is identified in the upper-outer quadrant of the right breast within an irregular mass previously evaluated. Within an enlarged right axillary lymph node, a coil shaped HydroMARK clip is present. IMPRESSION: Tissue marker clips are in expected locations following biopsy. Final Assessment: Post Procedure Mammograms for Marker Placement Electronically Signed   By: Nolon Nations M.D.   On: 06/21/2016 14:39   US Breast Ltd Uni Right Inc Axilla  Result Date: 06/15/2016 CLINICAL DATA:  Mass felt by the patient in the 11 o'clock position of the right breast for the past 3 months. EXAM: 2D DIGITAL DIAGNOSTIC BILATERAL MAMMOGRAM WITH CAD AND ADJUNCT TOMO ULTRASOUND RIGHT BREAST COMPARISON:  Previous exam(s). ACR Breast Density Category b: There are scattered areas of fibroglandular density. FINDINGS: Multiple rounded and oval, circumscribed masses are again demonstrated in both breasts. These demonstrate overall improvement with a significant decrease in size of multiple masses. There is an interval oval, lobulated mass with some mildly ill-defined margins and mild architectural distortion in the posterior aspect of the upper-outer right breast, corresponding to the palpable mass,  marked with a metallic marker. There are also multiple interval enlarged right axillary lymph nodes. No findings on the left suspicious for malignancy. Mammographic images were processed with CAD. On physical exam, there is an approximately 2.5 cm firm, rounded, palpable mass in the 10 o'clock position of the right breast, 12 cm from the nipple. There are no palpable right axillary lymph nodes. Targeted ultrasound is performed, showing a 2.8 x 2.7 x 2.5 cm oval, irregular mass with poorly defined margins in the 10 o'clock position of the right breast, 12 cm from the nipple. This is irregular and hypoechoic centrally and echogenic peripherally. This corresponds to the palpable mass, seen mammographically. Ultrasound of the right axilla demonstrated multiple abnormal appearing right axillary lymph nodes. The majority have diffuse cortical thickening with compression her loss of the normal fatty hilum. One has eccentric cortical thickening. The largest measures 2.2 x 1.8 x 1.8 cm. IMPRESSION: 1. 2.8 cm mass with imaging features highly suspicious for malignancy in the 10 o'clock position of the right breast, 12 cm from the nipple. 2. Multiple abnormal right axillary lymph nodes compatible with metastatic lymph nodes. RECOMMENDATION: Ultrasound-guided core needle biopsies of the 2.8 cm mass in the 10 o'clock position of the right breast and the largest abnormal appearing right axillary lymph node. These have been scheduled at 1:30 p.m. on 06/21/2016. I have discussed the findings and recommendations with the patient. Results were also provided in writing at the conclusion of the visit. If applicable, a reminder letter will be sent to the patient regarding the next appointment. BI-RADS CATEGORY  5: Highly suggestive of malignancy. Electronically Signed   By: Claudie Revering M.D.   On: 06/15/2016 11:31   Mm Diag Breast Tomo Bilateral  Result Date: 06/15/2016 CLINICAL DATA:  Mass felt by the patient in the 11 o'clock  position of the right breast for the past 3 months. EXAM: 2D DIGITAL DIAGNOSTIC BILATERAL MAMMOGRAM WITH CAD AND ADJUNCT TOMO ULTRASOUND RIGHT BREAST COMPARISON:  Previous exam(s). ACR Breast Density Category b: There are scattered areas of fibroglandular density. FINDINGS: Multiple rounded and oval, circumscribed  masses are again demonstrated in both breasts. These demonstrate overall improvement with a significant decrease in size of multiple masses. There is an interval oval, lobulated mass with some mildly ill-defined margins and mild architectural distortion in the posterior aspect of the upper-outer right breast, corresponding to the palpable mass, marked with a metallic marker. There are also multiple interval enlarged right axillary lymph nodes. No findings on the left suspicious for malignancy. Mammographic images were processed with CAD. On physical exam, there is an approximately 2.5 cm firm, rounded, palpable mass in the 10 o'clock position of the right breast, 12 cm from the nipple. There are no palpable right axillary lymph nodes. Targeted ultrasound is performed, showing a 2.8 x 2.7 x 2.5 cm oval, irregular mass with poorly defined margins in the 10 o'clock position of the right breast, 12 cm from the nipple. This is irregular and hypoechoic centrally and echogenic peripherally. This corresponds to the palpable mass, seen mammographically. Ultrasound of the right axilla demonstrated multiple abnormal appearing right axillary lymph nodes. The majority have diffuse cortical thickening with compression her loss of the normal fatty hilum. One has eccentric cortical thickening. The largest measures 2.2 x 1.8 x 1.8 cm. IMPRESSION: 1. 2.8 cm mass with imaging features highly suspicious for malignancy in the 10 o'clock position of the right breast, 12 cm from the nipple. 2. Multiple abnormal right axillary lymph nodes compatible with metastatic lymph nodes. RECOMMENDATION: Ultrasound-guided core needle  biopsies of the 2.8 cm mass in the 10 o'clock position of the right breast and the largest abnormal appearing right axillary lymph node. These have been scheduled at 1:30 p.m. on 06/21/2016. I have discussed the findings and recommendations with the patient. Results were also provided in writing at the conclusion of the visit. If applicable, a reminder letter will be sent to the patient regarding the next appointment. BI-RADS CATEGORY  5: Highly suggestive of malignancy. Electronically Signed   By: Claudie Revering M.D.   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 This was found to be concordant by Dr. Nolon Nations. Pathology results were discussed with the patient by telephone. The patient reported doing well after the biopsy. Post biopsy instructions and care were reviewed and questions were answered. The patient was encouraged to call [facility] for any additional concerns. The patient was referred to the [facility] at the [facility] on June 28, 2016. Pathology results reported by Susa Raring RN, BSN on June 23, 2016. Electronically Signed   By: Nolon Nations M.D.   On: 06/23/2016 07:36  Result Date: 06/23/2016 CLINICAL DATA:  Patient presents for ultrasound-guided core biopsy of mass within the 10 o'clock location of the right breast and enlarged right axillary lymph node. EXAM: ULTRASOUND GUIDED RIGHT BREAST CORE NEEDLE BIOPSY x2 COMPARISON:  Previous exam(s). FINDINGS: I met with the patient and we discussed the procedure of ultrasound-guided biopsy, including benefits and alternatives. We discussed the high likelihood of a successful procedure. We discussed the risks of the  procedure, including infection, bleeding, tissue injury, clip migration, and inadequate sampling. Informed written consent was given. The usual time-out protocol was performed immediately prior to the procedure. Using sterile technique and  1% Lidocaine as local anesthetic, under direct ultrasound visualization, a 12 gauge spring-loaded device was used to perform biopsy of mass in the 10 o'clock location of the right breast 15 cm from the nipple using a lateral approach. At the conclusion of the procedure a ribbon shaped tissue marker clip was deployed into the biopsy cavity. Using the same technique, a 14 gauge spring-loaded device was used to perform biopsy of enlarged lower right axillary lymph node using a lateral approach. At the conclusion of the procedure a coil shaped tissue marker clip was deployed into the biopsy cavity. Follow up 2 view mammogram was performed and dictated separately. IMPRESSION: Ultrasound guided biopsy of right breast mass and enlarged right axillary lymph node. No apparent complications. Electronically Signed: By: Nolon Nations M.D. 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 This was found to be concordant by Dr. Nolon Nations. Pathology results were discussed with the patient by telephone. The patient reported doing well after the biopsy. Post biopsy instructions and care were reviewed and questions were answered. The patient was encouraged to call [facility] for any additional concerns. The patient was referred to the [facility] at the [facility] on June 28, 2016. Pathology results reported by Susa Raring RN, BSN on June 23, 2016. Electronically  Signed   By: Nolon Nations M.D.   On: 06/23/2016 07:36  Result Date: 06/23/2016 CLINICAL DATA:  Patient presents for ultrasound-guided core biopsy of mass within the 10 o'clock location of the right breast and enlarged right axillary lymph node. EXAM: ULTRASOUND GUIDED RIGHT BREAST CORE NEEDLE BIOPSY x2 COMPARISON:  Previous exam(s). FINDINGS: I met with the patient and we discussed the procedure of ultrasound-guided biopsy, including benefits and alternatives. We discussed the high likelihood of a successful procedure. We discussed the risks of the procedure, including infection, bleeding, tissue injury, clip migration, and inadequate sampling. Informed written consent was given. The usual time-out protocol was performed immediately prior to the procedure. Using sterile technique and 1% Lidocaine as local anesthetic, under direct ultrasound visualization, a 12 gauge spring-loaded device was used to perform biopsy of mass in the 10 o'clock location of the right breast 15 cm from the nipple using a lateral approach. At the conclusion of the procedure a ribbon shaped tissue marker clip was deployed into the biopsy cavity. Using the same technique, a 14 gauge spring-loaded device was used to perform biopsy of enlarged lower right axillary lymph node using a lateral approach. At the conclusion of the procedure a coil shaped tissue marker clip was deployed into the biopsy cavity. Follow up 2 view mammogram was performed and dictated separately. IMPRESSION: Ultrasound guided biopsy of right breast mass and enlarged right axillary lymph node. No apparent complications. Electronically Signed: By: Nolon Nations M.D. On: 06/21/2016 14:11    ELIGIBLE FOR AVAILABLE RESEARCH PROTOCOL: [name], [facility]  ASSESSMENT: 55 y.o. [place] woman status post right breast upper outer quadrant and right axillary lymph node biopsy 06/21/2016, both positive for a clinical T2 N2,stage IIIA  invasive ductal carcinoma, grade 3,  estrogen and progesterone receptor positive, HER-2 nonamplified, with an MIB-1 between 20 and 25%   (1) neoadjuvant chemotherapy to consist  of doxorubicin and cyclophosphamide in dose dense fashion 4, followed by weekly paclitaxel 12  (2) definitive surgery to follow chemotherapy, with consideration of targeted axillary dissection versus participation in the Alliance trial  (3) adjuvant radiation to follow surgery  (4) anti-estrogens to follow at the completion of local treatment   PLAN: We spent the better part of today's hour-long appointment discussing the biology of breast cancer in general, and the specifics of the patient's tumor in particular. We first reviewed the fact that cancer is not one disease but more than 100 different diseases and that it is important to keep them separate-- otherwise when friends and relatives discuss their own cancer experiences with [place] confusion can result. Similarly we explained that if breast cancer spreads to the bone or liver, the patient would not have bone cancer or liver cancer, but breast cancer in the bone and breast cancer in the liver: one cancer in three places-- not 3 different cancers which otherwise would have to be treated in 3 different ways.  We discussed the difference between local and systemic therapy. In terms of loco-regional treatment, lumpectomy plus radiation is equivalent to mastectomy as far as survival is concerned. For this reason, and because the cosmetic results are generally superior, we generally recommend breast conserving surgery. We also noted that in terms of sequencing of treatments, whether systemic therapy or surgery is done first does not affect the ultimate outcome.  We then discussed the rationale for systemic therapy. There is some risk that this cancer may have already spread to other parts of her body. We are going to be staging this patient with CT scans and a bone scan, but even if these are negative, as we  hope, there is a significant risk of occult disease. Accordingly she will benefit noted only from anti-estrogens by from chemotherapy as well. She is not a candidate for anti-HER-2 treatment.  Our feeling is that Arriel would do best with neoadjuvant chemotherapy, which should make the surgery easier and in particular help her avoid a full axillary lymph node dissection, which otherwise would be standard at this point.  The plan accordingly will be for doxorubicin and cyclophosphamide in dose dense fashion 4, followed by paclitaxel weekly 12 area she will start treatment 07/17/2016. She will see me shortly before that to go over her antinausea and other supportive medicines. She will also have her staging studies, a baseline breast MRI and echocardiogram, and visit our chemotherapy teaching nurse, who will give her a "tumor" of the treatment area as well.  Once she completes neoadjuvant chemotherapy Lynsie we'll proceed to definitive surgery, followed by adjuvant radiation, then adjuvant anti-estrogens.   she has a good understanding of the overall plan. She agrees with it. She knows the goal of treatment in her case is cure. She will call with any problems that may develop before her next visit here.  Chauncey Cruel, MD   07/01/2016 8:07 AM Medical Oncology and Hematology [facility] [address] [place], [place] [address] Tel. [phone]    Fax. [phone]

## 2016-07-03 ENCOUNTER — Telehealth: Payer: Self-pay | Admitting: Oncology

## 2016-07-03 ENCOUNTER — Telehealth: Payer: Self-pay | Admitting: *Deleted

## 2016-07-03 NOTE — Telephone Encounter (Signed)
  Oncology Nurse Navigator Documentation    Navigator Encounter Type: Telephone (Left vm regarding [place] from 06/28/16) (07/03/16 1400) Telephone: [facility] Crocker Call;Clinic/MDC Follow-up (Contact information provided) (07/03/16 1400)                                        Time Spent with Patient: 15 (07/03/16 1400)

## 2016-07-03 NOTE — Telephone Encounter (Signed)
CT and bone scan to be scheduled by central radiology

## 2016-07-03 NOTE — Telephone Encounter (Signed)
Spoke to pt concerning [place] from 06/28/16. Denies questions or concerns regarding dx or treatment care plan. Confirmed further appts. Encourage pt to call with needs. Received verbal understanding. Contact information provided.

## 2016-07-04 ENCOUNTER — Encounter: Payer: Self-pay | Admitting: *Deleted

## 2016-07-04 ENCOUNTER — Other Ambulatory Visit: Payer: Self-pay

## 2016-07-04 NOTE — Progress Notes (Signed)
[place] Work  Clinical Social Work was referred by chaplin for assessment of psychosocial needs.  Clinical Social Worker met with patient and patients husband in [place] office at [facility] to offer support and assess for needs.  Patient expressed some financial concerns regarding treatment and additional expenses.  Patient is receiving [employer] Medicaid so her medical expenses associated with her treatment should be covered.  Due to guidelines/requirments of other financial assistance organizations there is limited resources.  CSW provided cancer care information and encouraged patient to meet with the financial advocate to discuss the grant.  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  Patient stated her mother has services through [facility].  Patient stated [facility] is aware of her situation and is willing to "work with them", and provide some additional support.  CSW provided contact information and encouraged patient to call with questions or concerns.            Johnnye Lana, MSW, LCSW, OSW-C Clinical Social Worker [facility] [phone]

## 2016-07-07 ENCOUNTER — Other Ambulatory Visit: Payer: Self-pay | Admitting: General Surgery

## 2016-07-07 ENCOUNTER — Inpatient Hospital Stay: Admission: RE | Admit: 2016-07-07 | Payer: No Typology Code available for payment source | Source: Ambulatory Visit

## 2016-07-07 NOTE — Patient Instructions (Addendum)
April [name]  07/07/2016   Your procedure is scheduled on: 07-11-16  Report to [facility] Main  Entrance take [facility]  elevators to 3rd floor to  [facility] at215pm.  Call this number if you have problems the morning of surgery [phone]   Remember: ONLY 1 PERSON MAY GO WITH YOU TO SHORT STAY TO GET  READY MORNING OF YOUR SURGERY.  Do not eat food :After Midnight, may have clear liquids from midnight until  700 am day of surgery,DUE TO CT SCAN SCHEDULED.NOTHING AFTER .     Take these medicines the morning of surgery with A SIP OF WATER: DULOXETINE (CYMBALTA), NASONEX NASAL SPRAY, ZOVIRAX                               You may not have any metal on your body including hair pins and              piercings  Do not wear jewelry, make-up, lotions, powders or perfumes, deodorant             Do not wear nail polish.  Do not shave  48 hours prior to surgery.              Men may shave face and neck.   Do not bring valuables to the hospital. [place].  Contacts, dentures or bridgework may not be worn into surgery.  Leave suitcase in the car. After surgery it may be brought to your room.     Patients discharged the day of surgery will not be allowed to drive home.  Name and phone number of your driver:  Special Instructions: N/A              Please read over the following fact sheets you were given: _____________________________________________________________________                CLEAR LIQUID DIET   Foods Allowed                                                                     Foods Excluded  Coffee and tea, regular and decaf                             liquids that you cannot  Plain Jell-O in any flavor                                             see through such as: Fruit ices (not with fruit pulp)                                     milk, soups, orange juice  Iced Popsicles  All solid food Carbonated beverages, regular and diet                                    Cranberry, grape and apple juices Sports drinks like Gatorade Lightly seasoned clear broth or consume(fat free) Sugar, honey syrup  Sample Menu Breakfast                                Lunch                                     Supper Cranberry juice                    Beef broth                            Chicken broth Jell-O                                     Grape juice                           Apple juice Coffee or tea                        Jell-O                                      Popsicle                                                Coffee or tea                        Coffee or tea  _____________________________________________________________________  [facility] - Preparing for Surgery Before surgery, you can play an important role.  Because skin is not sterile, your skin needs to be as free of germs as possible.  You can reduce the number of germs on your skin by washing with CHG (chlorahexidine gluconate) soap before surgery.  CHG is an antiseptic cleaner which kills germs and bonds with the skin to continue killing germs even after washing. Please DO NOT use if you have an allergy to CHG or antibacterial soaps.  If your skin becomes reddened/irritated stop using the CHG and inform your nurse when you arrive at Short Stay. Do not shave (including legs and underarms) for at least 48 hours prior to the first CHG shower.  You may shave your face/neck. Please follow these instructions carefully:  1.  Shower with CHG Soap the night before surgery and the  morning of Surgery.  2.  If you choose to wash your hair, wash your hair first as usual with your  normal  shampoo.  3.  After you shampoo, rinse your hair and body thoroughly to remove the  shampoo.  4.  Use CHG as you would any other liquid soap.  You can apply chg directly  to the skin and wash                        Gently with a scrungie or clean washcloth.  5.  Apply the CHG Soap to your body ONLY FROM THE NECK DOWN.   Do not use on face/ open                           Wound or open sores. Avoid contact with eyes, ears mouth and genitals (private parts).                       Wash face,  Genitals (private parts) with your normal soap.             6.  Wash thoroughly, paying special attention to the area where your surgery  will be performed.  7.  Thoroughly rinse your body with warm water from the neck down.  8.  DO NOT shower/wash with your normal soap after using and rinsing off  the CHG Soap.                9.  Pat yourself dry with a clean towel.            10.  Wear clean pajamas.            11.  Place clean sheets on your bed the night of your first shower and do not  sleep with pets. Day of Surgery : Do not apply any lotions/deodorants the morning of surgery.  Please wear clean clothes to the hospital/surgery center.  FAILURE TO FOLLOW THESE INSTRUCTIONS MAY RESULT IN THE CANCELLATION OF YOUR SURGERY PATIENT SIGNATURE_________________________________  NURSE SIGNATURE__________________________________  ________________________________________________________________________

## 2016-07-08 ENCOUNTER — Encounter: Payer: Self-pay | Admitting: Oncology

## 2016-07-10 ENCOUNTER — Encounter (HOSPITAL_COMMUNITY)
Admission: RE | Admit: 2016-07-10 | Discharge: 2016-07-10 | Disposition: A | Discharge status: Home / Self Care | Payer: Medicaid Other | Source: Ambulatory Visit | Attending: General Surgery | Admitting: General Surgery

## 2016-07-10 ENCOUNTER — Encounter (HOSPITAL_COMMUNITY): Payer: Self-pay

## 2016-07-10 DIAGNOSIS — Z01812 Encounter for preprocedural laboratory examination: Secondary | ICD-10-CM | POA: Insufficient documentation

## 2016-07-10 DIAGNOSIS — C50919 Malignant neoplasm of unspecified site of unspecified female breast: Secondary | ICD-10-CM | POA: Diagnosis not present

## 2016-07-10 HISTORY — DX: Gastro-esophageal reflux disease without esophagitis: K21.9

## 2016-07-10 HISTORY — DX: Malignant (primary) neoplasm, unspecified: C80.1

## 2016-07-10 LAB — CBC
HEMATOCRIT: 43.2 % (ref 36.0–46.0)
Hemoglobin: 14.4 g/dL (ref 12.0–15.0)
MCH: 31.9 pg (ref 26.0–34.0)
MCHC: 33.3 g/dL (ref 30.0–36.0)
MCV: 95.6 fL (ref 78.0–100.0)
Platelets: 298 10*3/uL (ref 150–400)
RBC: 4.52 MIL/uL (ref 3.87–5.11)
RDW: 13.7 % (ref 11.5–15.5)
WBC: 4.5 10*3/uL (ref 4.0–10.5)

## 2016-07-11 ENCOUNTER — Ambulatory Visit (HOSPITAL_COMMUNITY)
Admission: RE | Admit: 2016-07-11 | Discharge: 2016-07-11 | Disposition: A | Discharge status: Home / Self Care | Payer: Medicaid Other | Source: Ambulatory Visit | Attending: General Surgery | Admitting: General Surgery

## 2016-07-11 ENCOUNTER — Ambulatory Visit (HOSPITAL_COMMUNITY): Payer: Medicaid Other | Admitting: Anesthesiology

## 2016-07-11 ENCOUNTER — Encounter (HOSPITAL_COMMUNITY)
Admission: RE | Admit: 2016-07-11 | Discharge: 2016-07-11 | Disposition: A | Discharge status: Home / Self Care | Payer: Medicaid Other | Source: Ambulatory Visit | Attending: Oncology | Admitting: Oncology

## 2016-07-11 ENCOUNTER — Encounter (HOSPITAL_COMMUNITY): Payer: Self-pay | Admitting: *Deleted

## 2016-07-11 ENCOUNTER — Encounter (HOSPITAL_COMMUNITY): Admission: RE | Admit: 2016-07-11 | Payer: Medicaid Other | Source: Ambulatory Visit

## 2016-07-11 ENCOUNTER — Encounter (HOSPITAL_COMMUNITY): Payer: Self-pay

## 2016-07-11 ENCOUNTER — Telehealth: Payer: Self-pay | Admitting: *Deleted

## 2016-07-11 ENCOUNTER — Ambulatory Visit (HOSPITAL_COMMUNITY): Payer: Medicaid Other

## 2016-07-11 ENCOUNTER — Ambulatory Visit (HOSPITAL_COMMUNITY)
Admission: RE | Admit: 2016-07-11 | Discharge: 2016-07-11 | Disposition: A | Discharge status: Home / Self Care | Payer: Medicaid Other | Source: Ambulatory Visit | Attending: Oncology | Admitting: Oncology

## 2016-07-11 ENCOUNTER — Other Ambulatory Visit: Payer: Self-pay | Admitting: *Deleted

## 2016-07-11 ENCOUNTER — Encounter (HOSPITAL_COMMUNITY)
Admission: RE | Disposition: A | Discharge status: Home / Self Care | Payer: Self-pay | Source: Ambulatory Visit | Attending: General Surgery

## 2016-07-11 DIAGNOSIS — Z7951 Long term (current) use of inhaled steroids: Secondary | ICD-10-CM | POA: Insufficient documentation

## 2016-07-11 DIAGNOSIS — Z170 Estrogen receptor positive status [ER+]: Secondary | ICD-10-CM | POA: Diagnosis not present

## 2016-07-11 DIAGNOSIS — C773 Secondary and unspecified malignant neoplasm of axilla and upper limb lymph nodes: Secondary | ICD-10-CM | POA: Insufficient documentation

## 2016-07-11 DIAGNOSIS — F329 Major depressive disorder, single episode, unspecified: Secondary | ICD-10-CM | POA: Insufficient documentation

## 2016-07-11 DIAGNOSIS — Z87891 Personal history of nicotine dependence: Secondary | ICD-10-CM | POA: Diagnosis not present

## 2016-07-11 DIAGNOSIS — C50411 Malignant neoplasm of upper-outer quadrant of right female breast: Secondary | ICD-10-CM

## 2016-07-11 DIAGNOSIS — C50911 Malignant neoplasm of unspecified site of right female breast: Secondary | ICD-10-CM

## 2016-07-11 DIAGNOSIS — F419 Anxiety disorder, unspecified: Secondary | ICD-10-CM | POA: Insufficient documentation

## 2016-07-11 DIAGNOSIS — Z95828 Presence of other vascular implants and grafts: Secondary | ICD-10-CM

## 2016-07-11 HISTORY — PX: PORTACATH PLACEMENT: SHX2246

## 2016-07-11 SURGERY — INSERTION, TUNNELED CENTRAL VENOUS DEVICE, WITH PORT
Anesthesia: General

## 2016-07-11 MED ORDER — LIDOCAINE HCL (CARDIAC) 20 MG/ML IV SOLN
INTRAVENOUS | Status: DC | PRN
  Administered 2016-07-11: 40 mg via INTRAVENOUS

## 2016-07-11 MED ORDER — CHLORHEXIDINE GLUCONATE CLOTH 2 % EX PADS: 6.0000 | MEDICATED_PAD | Freq: Once | CUTANEOUS | Status: DC

## 2016-07-11 MED ORDER — BUPIVACAINE-EPINEPHRINE 0.25% -1:200000 IJ SOLN
INTRAMUSCULAR | Status: DC | PRN
  Administered 2016-07-11: 14 mL

## 2016-07-11 MED ORDER — TECHNETIUM TC 99M MEDRONATE IV KIT
26.5000 | PACK | Freq: Once | INTRAVENOUS | Status: AC | PRN
  Administered 2016-07-11: 26.5 via INTRAVENOUS

## 2016-07-11 MED ORDER — CELECOXIB 200 MG PO CAPS: 400.0000 mg | ORAL_CAPSULE | ORAL | Status: DC

## 2016-07-11 MED ORDER — FENTANYL CITRATE (PF) 250 MCG/5ML IJ SOLN
INTRAMUSCULAR | Status: DC | PRN
  Administered 2016-07-11: 50 ug via INTRAVENOUS
  Administered 2016-07-11 (×2): 25 ug via INTRAVENOUS
  Administered 2016-07-11: 50 ug via INTRAVENOUS

## 2016-07-11 MED ORDER — FENTANYL CITRATE (PF) 250 MCG/5ML IJ SOLN
INTRAMUSCULAR | Status: AC
  Filled 2016-07-11: qty 5

## 2016-07-11 MED ORDER — HEPARIN SOD (PORK) LOCK FLUSH 100 UNIT/ML IV SOLN
INTRAVENOUS | Status: AC
  Filled 2016-07-11: qty 5

## 2016-07-11 MED ORDER — PROPOFOL 10 MG/ML IV BOLUS
INTRAVENOUS | Status: DC | PRN
  Administered 2016-07-11: 150 mg via INTRAVENOUS

## 2016-07-11 MED ORDER — MIDAZOLAM HCL 2 MG/2ML IJ SOLN
INTRAMUSCULAR | Status: AC
  Filled 2016-07-11: qty 2

## 2016-07-11 MED ORDER — SODIUM CHLORIDE 0.9 % IV SOLN
Freq: Once | INTRAVENOUS | Status: AC
Start: 2016-07-11 — End: 2016-07-11
  Administered 2016-07-11: 500 mL
  Filled 2016-07-11: qty 1.2

## 2016-07-11 MED ORDER — HEPARIN SOD (PORK) LOCK FLUSH 100 UNIT/ML IV SOLN
INTRAVENOUS | Status: DC | PRN
  Administered 2016-07-11: 500 [IU]

## 2016-07-11 MED ORDER — ACETAMINOPHEN 500 MG PO TABS
1000.0000 mg | ORAL_TABLET | Freq: Once | ORAL | Status: AC
  Administered 2016-07-11: 1000 mg via ORAL
  Filled 2016-07-11: qty 2

## 2016-07-11 MED ORDER — ONDANSETRON HCL 4 MG/2ML IJ SOLN
INTRAMUSCULAR | Status: DC | PRN
  Administered 2016-07-11: 4 mg via INTRAVENOUS

## 2016-07-11 MED ORDER — MIDAZOLAM HCL 2 MG/2ML IJ SOLN
INTRAMUSCULAR | Status: DC | PRN
  Administered 2016-07-11: 2 mg via INTRAVENOUS

## 2016-07-11 MED ORDER — FENTANYL CITRATE (PF) 100 MCG/2ML IJ SOLN: 25.0000 ug | INTRAMUSCULAR | Status: DC | PRN

## 2016-07-11 MED ORDER — CEFAZOLIN SODIUM-DEXTROSE 2-4 GM/100ML-% IV SOLN
2.0000 g | INTRAVENOUS | Status: AC
  Administered 2016-07-11: 2 g via INTRAVENOUS

## 2016-07-11 MED ORDER — HYDROCODONE-ACETAMINOPHEN 7.5-325 MG PO TABS: 1.0000 | ORAL_TABLET | Freq: Once | ORAL | Status: DC | PRN

## 2016-07-11 MED ORDER — BUPIVACAINE-EPINEPHRINE (PF) 0.25% -1:200000 IJ SOLN
INTRAMUSCULAR | Status: AC
  Filled 2016-07-11: qty 30

## 2016-07-11 MED ORDER — CELECOXIB 400 MG PO CAPS
400.0000 mg | ORAL_CAPSULE | Freq: Once | ORAL | Status: AC
  Administered 2016-07-11: 400 mg via ORAL
  Filled 2016-07-11: qty 1

## 2016-07-11 MED ORDER — CEFAZOLIN SODIUM-DEXTROSE 2-4 GM/100ML-% IV SOLN
INTRAVENOUS | Status: AC
  Filled 2016-07-11: qty 100

## 2016-07-11 MED ORDER — IOPAMIDOL (ISOVUE-300) INJECTION 61%
75.0000 mL | Freq: Once | INTRAVENOUS | Status: AC | PRN
  Administered 2016-07-11: 75 mL via INTRAVENOUS

## 2016-07-11 MED ORDER — ACETAMINOPHEN 500 MG PO TABS: 1000.0000 mg | ORAL_TABLET | ORAL | Status: DC

## 2016-07-11 MED ORDER — LACTATED RINGERS IV SOLN
INTRAVENOUS | Status: DC | PRN
  Administered 2016-07-11: 16:00:00 via INTRAVENOUS

## 2016-07-11 MED ORDER — PROPOFOL 10 MG/ML IV BOLUS
INTRAVENOUS | Status: AC
  Filled 2016-07-11: qty 20

## 2016-07-11 MED ORDER — PROMETHAZINE HCL 25 MG/ML IJ SOLN: 6.2500 mg | INTRAMUSCULAR | Status: DC | PRN

## 2016-07-11 MED ORDER — DIATRIZOATE MEGLUMINE & SODIUM 66-10 % PO SOLN: 30.0000 mL | Freq: Once | ORAL | Status: DC

## 2016-07-11 SURGICAL SUPPLY — 34 items
ADH SKN CLS APL DERMABOND .7 (GAUZE/BANDAGES/DRESSINGS) ×1
APL SKNCLS STERI-STRIP NONHPOA (GAUZE/BANDAGES/DRESSINGS)
BAG DECANTER FOR FLEXI CONT (MISCELLANEOUS) ×2 IMPLANT
BENZOIN TINCTURE PRP APPL 2/3 (GAUZE/BANDAGES/DRESSINGS) IMPLANT
BLADE SURG 15 STRL LF DISP TIS (BLADE) ×1 IMPLANT
BLADE SURG 15 STRL SS (BLADE) ×2
CHLORAPREP W/TINT 26ML (MISCELLANEOUS) ×2 IMPLANT
COVER SURGICAL LIGHT HANDLE (MISCELLANEOUS) IMPLANT
DECANTER SPIKE VIAL GLASS SM (MISCELLANEOUS) ×2 IMPLANT
DERMABOND ADVANCED (GAUZE/BANDAGES/DRESSINGS) ×1
DERMABOND ADVANCED .7 DNX12 (GAUZE/BANDAGES/DRESSINGS) IMPLANT
DRAPE C-ARM 42X120 X-RAY (DRAPES) ×2 IMPLANT
DRAPE LAPAROSCOPIC ABDOMINAL (DRAPES) ×2 IMPLANT
ELECT PENCIL ROCKER SW 15FT (MISCELLANEOUS) ×2 IMPLANT
ELECT REM PT RETURN 9FT ADLT (ELECTROSURGICAL) ×2
ELECTRODE REM PT RTRN 9FT ADLT (ELECTROSURGICAL) ×1 IMPLANT
GAUZE SPONGE 4X4 12PLY STRL (GAUZE/BANDAGES/DRESSINGS) IMPLANT
GAUZE SPONGE 4X4 16PLY XRAY LF (GAUZE/BANDAGES/DRESSINGS) ×2 IMPLANT
GLOVE BIOGEL PI IND STRL 7.5 (GLOVE) ×1 IMPLANT
GLOVE BIOGEL PI INDICATOR 7.5 (GLOVE) ×1
GLOVE ECLIPSE 7.5 STRL STRAW (GLOVE) ×2 IMPLANT
GOWN STRL REUS W/TWL XL LVL3 (GOWN DISPOSABLE) ×4 IMPLANT
KIT BASIN OR (CUSTOM PROCEDURE TRAY) ×2 IMPLANT
KIT PORT POWER 8FR ISP CVUE (Catheter) ×1 IMPLANT
LIQUID BAND (GAUZE/BANDAGES/DRESSINGS) IMPLANT
NEEDLE HYPO 22GX1.5 SAFETY (NEEDLE) ×2 IMPLANT
PACK BASIC VI WITH GOWN DISP (CUSTOM PROCEDURE TRAY) ×2 IMPLANT
STRIP CLOSURE SKIN 1/2X4 (GAUZE/BANDAGES/DRESSINGS) IMPLANT
SUT MNCRL AB 4-0 PS2 18 (SUTURE) ×2 IMPLANT
SUT PROLENE 2 0 CT2 30 (SUTURE) ×2 IMPLANT
SYR 10ML ECCENTRIC (SYRINGE) ×2 IMPLANT
SYR CONTROL 10ML LL (SYRINGE) ×2 IMPLANT
TOWEL OR 17X26 10 PK STRL BLUE (TOWEL DISPOSABLE) ×2 IMPLANT
TOWEL OR NON WOVEN STRL DISP B (DISPOSABLE) ×2 IMPLANT

## 2016-07-11 NOTE — Anesthesia Preprocedure Evaluation (Addendum)
Anesthesia Evaluation  Patient identified by MRN, date of birth, ID band Patient awake    Reviewed: Allergy & Precautions, NPO status , Patient's Chart, lab work & pertinent test results  Airway Mallampati: I  TM Distance: >3 FB Neck ROM: Full    Dental  (+) Dental Advisory Given, Chipped   Pulmonary former smoker,    breath sounds clear to auscultation       Cardiovascular  Rhythm:Regular Rate:Normal     Neuro/Psych  Headaches, PSYCHIATRIC DISORDERS Anxiety Depression    GI/Hepatic GERD  ,  Endo/Other    Renal/GU      Musculoskeletal  (+) Arthritis , Fibromyalgia -  Abdominal   Peds  Hematology   Anesthesia Other Findings Genital herpes, breast cancer  Reproductive/Obstetrics                           Lab Results  Component Value Date   WBC 4.5 07/10/2016   HGB 14.4 07/10/2016   HCT 43.2 07/10/2016   MCV 95.6 07/10/2016   PLT 298 07/10/2016   Lab Results  Component Value Date   CREATININE 0.8 06/28/2016   BUN 15.6 06/28/2016   NA 138 06/28/2016   K 4.1 06/28/2016   CL 108 07/14/2015   CO2 20 (L) 06/28/2016    Anesthesia Physical Anesthesia Plan  ASA: II  Anesthesia Plan: General   Post-op Pain Management:    Induction: Intravenous  Airway Management Planned: LMA  Additional Equipment:   Intra-op Plan:   Post-operative Plan: Extubation in OR  Informed Consent: I have reviewed the patients History and Physical, chart, labs and discussed the procedure including the risks, benefits and alternatives for the proposed anesthesia with the patient or authorized representative who has indicated his/her understanding and acceptance.   Dental advisory given  Plan Discussed with:   Anesthesia Plan Comments:         Anesthesia Quick Evaluation

## 2016-07-11 NOTE — H&P (Signed)
History of Present Illness April [name] [name] MD; 06/28/2016 3:33 PM) The patient is a 55 year old female who presents with breast cancer. She is a post menopausal female referred by Dr. Nolon Nations for evaluation of recently diagnosed carcinoma of the right breast. She recently was able to feel a lump in her upper outer right breast near the axilla. she has a history of fibrocystic disease and lumpy breasts but all previously benign so she initially did not worry about it. However it persisted and she saw her primary physician and was referred for workup at the breast center. iinitial mammogram showed no apparent change and multiple oval circumscribed masses in both breasts with history of multiple cysts. However there was a new lobulated mass with somewhat ill-defined margins in the upper outer right breast posteriorly corresponding to her palpable abnormality and also multiple enlarged lymph nodes. targeted ultrasound showed a 2.8 cm irregular mass in the 10:00 position right breast 12 cm from the nipple as well as multiple abnormal appearing axillary lymph nodes measuring up to 2.2 cm. 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 She is seen now in multidisciplinary clinic for initial treatment planning. She has experienced a breast lump as above but no other symptoms such aspain or skin changes or nipple discharge.   Findings at that time were the following: Tumor size: 2.8 cm Tumor grade: 3, Ki-67 25% Estrogen Receptor: +95% Progesterone Receptor: +90% Her-2 neu: negative Lymph node status: positive    Other Problems Tawni Pummel, RN; 06/28/2016 8:08 AM) Anxiety Disorder Depression Gastroesophageal Reflux Disease Migraine Headache  Past Surgical History Tawni Pummel, RN;  06/28/2016 8:08 AM) Breast Biopsy Right. Oral Surgery  Diagnostic Studies History Tawni Pummel, RN; 06/28/2016 8:08 AM) Colonoscopy 5-10 years ago Mammogram within last year Pap Smear 1-5 years ago  Medication History Tawni Pummel, RN; 06/28/2016 8:09 AM) Medications Reconciled  Social History Tawni Pummel, RN; 06/28/2016 8:08 AM) Alcohol use Occasional alcohol use. Caffeine use Coffee, Tea. No drug use Tobacco use Former smoker.  Family History Tawni Pummel, RN; 06/28/2016 8:08 AM) Arthritis Mother. Cancer Father. Cerebrovascular Accident Sister. Depression Mother. Heart Disease Mother. Heart disease in female family member before age 94 Hypertension Mother. Migraine Headache Brother. Respiratory Condition Father. Thyroid problems Sister.  Pregnancy / Birth History Tawni Pummel, RN; 06/28/2016 8:08 AM) Age at menarche 3 years. Age of menopause 53-50 Contraceptive History Oral contraceptives. Gravida 3 Length (months) of breastfeeding 12-24 Maternal age 50-25 Para 1    Review of Systems Sunday Spillers Ledford RN; 06/28/2016 8:08 AM) General Present- Appetite Loss and Fatigue. Not Present- Chills, Fever, Night Sweats, Weight Gain and Weight Loss. Skin Present- Rash. Not Present- Change in Wart/Mole, Dryness, Hives, Jaundice, New Lesions, Non-Healing Wounds and Ulcer. HEENT Present- Seasonal Allergies and Wears glasses/contact lenses. Not Present- Earache, Hearing Loss, Hoarseness, Nose Bleed, Oral Ulcers, Ringing in the Ears, Sinus Pain, Sore Throat, Visual Disturbances and Yellow Eyes. Respiratory Not Present- Bloody sputum, Chronic Cough, Difficulty Breathing, Snoring and Wheezing. Breast Present- Breast Mass and Breast Pain. Not Present- Nipple Discharge and Skin Changes. Cardiovascular Not Present- Chest Pain, Difficulty Breathing Lying Down, Leg Cramps, Palpitations, Rapid Heart Rate, Shortness of Breath and Swelling of  Extremities. Gastrointestinal Not Present- Abdominal Pain, Bloating, Bloody Stool, Change in Bowel Habits, Chronic diarrhea, Constipation, Difficulty Swallowing, Excessive gas, Gets full  quickly at meals, Hemorrhoids, Indigestion, Nausea, Rectal Pain and Vomiting. Female Genitourinary Not Present- Frequency, Nocturia, Painful Urination, Pelvic Pain and Urgency. Musculoskeletal Present- Muscle Weakness. Not Present- Back Pain, Joint Pain, Joint Stiffness, Muscle Pain and Swelling of Extremities. Neurological Present- Headaches and Weakness. Not Present- Decreased Memory, Fainting, Numbness, Seizures, Tingling, Tremor and Trouble walking. Psychiatric Present- Depression. Not Present- Anxiety, Bipolar, Change in Sleep Pattern, Fearful and Frequent crying. Endocrine Present- Hot flashes. Not Present- Cold Intolerance, Excessive Hunger, Hair Changes, Heat Intolerance and New Diabetes. Hematology Present- Easy Bruising. Not Present- Blood Thinners, Excessive bleeding, Gland problems, HIV and Persistent Infections.   Physical Exam April [name] T. April Quinnell MD; 06/28/2016 3:35 PM) The physical exam findings are as follows: Note:General: Alert, moderately obese Caucasian female, in no distress Skin: Warm and dry without rash or infection. HEENT: No palpable masses or thyromegaly. Sclera nonicteric. Pupils equal round and reactive. Lymph nodes: No cervical, supraclavicular, or inguinal nodes palpable. Breasts: large breasts bilaterally. In the upper outer right breast in the tail of Donalda Ewings is an approximately 2-1/2 cm firm palpable freely movable mass. I cannot feel any definite axillary adenopathy. Lungs: Breath sounds clear and equal. No wheezing or increased work of breathing. Cardiovascular: Regular rate and rhythm without murmer. No JVD or edema. Peripheral pulses intact. No carotid bruits. Abdomen: Nondistended. Soft and nontender. No masses palpable. no organomegaly Extremities: No edema or joint  swelling or deformity. No chronic venous stasis changes. Neurologic: Alert and fully oriented. Gait normal. No focal weakness. Psychiatric: Normal mood and affect. Thought content appropriate with normal judgement and insight    Assessment & Plan April [name] T. Joeph Szatkowski MD; 06/28/2016 3:42 PM) CANCER OF RIGHT BREAST, STAGE 3 (C50.911) Impression: 55 year old female with a new diagnosis of cancer of the right breast, upper outer quadrant. Clinical stage 3, ER positive, PR positive, HER-2 negative. I discussed with the patient and her daughter today initial surgical treatment options. We discussed options of breast conservation with lumpectomy or total mastectomy and sentinal lymph node biopsy/dissection. After discussion they have elected to proceed with breast conservation with lumpectomy. We discussed the indications and nature of the procedure. she is going to require chemotherapy. Although she could have surgery initially I think it would be benefit to neoadjuvant chemotherapy in order to shrink her primary tumor and if we could render her axilla clinically negative we could consider targeted axillary dissection instead of full axillary dissection. we discussed subsequent radiation and hormonal therapy.She understands is in agreement with this plan. She will ntherapy.t-A-Cath placement for neoadjuvant chemotherapy. I discussed the procedure and its indications including the risks of anesthetic complications, bleeding, infection,, pneumothorax and long-term risks of DVT or catheter malfunction or displacement requiring revision or replacement. All of her and her daughter's questions were answered.  Current Plans MRI, BOTH BREASTS (73532) Pt Education - [facility] Breast Cancer Information Given - Alight "Breast Journey" Package Port-A-Cath placement under general anesthesia as an outpatient

## 2016-07-11 NOTE — Telephone Encounter (Signed)
This RN spoke with pt's daughter-Sydney-who is inquiring about concern that MRI was not performed on Friday due to medicaid still pending.  Pt is obtaining port today and Jodi Mourning is asking if MRI can still be done once a port is placed.  This RN informed Bonnielee Haff can be done with a port in place.  This RN will follow up with navigator regarding need for mri to be rescheduled prior to start of chemo on 8/14.  Jodi Mourning can be reached at [phone].

## 2016-07-11 NOTE — Discharge Instructions (Signed)
PORT-A-CATH: POST OP INSTRUCTIONS  Always review your discharge instruction sheet given to you by the facility where your surgery was performed.   1. A prescription for pain medication may be given to you upon discharge. Take your pain medication as prescribed, if needed. If narcotic pain medicine is not needed, then you make take acetaminophen (Tylenol) or ibuprofen (Advil) as needed. Use Advil due to allergies 2. Take your usually prescribed medications unless otherwise directed. 3. If you need a refill on your pain medication, please contact our office. All narcotic pain medicine now requires a paper prescription.  Phoned in and fax refills are no longer allowed by law.  Prescriptions will not be filled after 5 pm or on weekends.  4. You should follow a light diet for the remainder of the day after your procedure. 5. Most patients will experience some mild swelling and/or bruising in the area of the incision. It may take several days to resolve. 6. It is common to experience some constipation if taking pain medication after surgery. Increasing fluid intake and taking a stool softener (such as Colace) will usually help or prevent this problem from occurring. A mild laxative (Milk of Magnesia or Miralax) should be taken according to package directions if there are no bowel movements after 48 hours.  7. Unless discharge instructions indicate otherwise, you may remove your bandages 48 hours after surgery, and you may shower at that time. You may have steri-strips (small white skin tapes) in place directly over the incision.  These strips should be left on the skin for 7-10 days.  If your surgeon used Dermabond (skin glue) on the incision, you may shower in 24 hours.  The glue will flake off over the next 2-3 weeks.  8. If your port is left accessed at the end of surgery (needle left in port), the dressing cannot get wet and should only by changed by a healthcare professional. When the port is no  longer accessed (when the needle has been removed), follow step 7.   9. ACTIVITIES:  Limit activity involving your arms for the next 72 hours. Do no strenuous exercise or activity for 1 week. You may drive when you are no longer taking prescription pain medication, you can comfortably wear a seatbelt, and you can maneuver your car. 10.You may need to see your doctor in the office for a follow-up appointment.  Please       check with your doctor.  11.When you receive a new Port-a-Cath, you will get a product guide and        ID card.  Please keep them in case you need them.  WHEN TO CALL YOUR DOCTOR [phone]): 1. Fever over 101.0 2. Chills 3. Continued bleeding from incision 4. Increased redness and tenderness at the site 5. Shortness of breath, difficulty breathing   The clinic staff is available to answer your questions during regular business hours. Please dont hesitate to call and ask to speak to one of the nurses or medical assistants for clinical concerns. If you have a medical emergency, go to the nearest emergency room or call 911.  A surgeon from [facility] is always on call at the hospital.     For further information, please visit [email or web address] General Anesthesia, Adult, Care After Refer to this sheet in the next few weeks. These instructions provide you with information on caring for yourself after your procedure. Your health care provider may also give you more specific instructions. Your  treatment has been planned according to current medical practices, but problems sometimes occur. Call your health care provider if you have any problems or questions after your procedure. WHAT TO EXPECT AFTER THE PROCEDURE After the procedure, it is typical to experience:  Sleepiness.  Nausea and vomiting. HOME CARE INSTRUCTIONS  For the first 24 hours after general anesthesia:  Have a responsible person with you.  Do not drive a car. If you are  alone, do not take public transportation.  Do not drink alcohol.  Do not take medicine that has not been prescribed by your health care provider.  Do not sign important papers or make important decisions.  You may resume a normal diet and activities as directed by your health care provider.  Change bandages (dressings) as directed.  If you have questions or problems that seem related to general anesthesia, call the hospital and ask for the anesthetist or anesthesiologist on call. SEEK MEDICAL CARE IF:  You have nausea and vomiting that continue the day after anesthesia.  You develop a rash. SEEK IMMEDIATE MEDICAL CARE IF:   You have difficulty breathing.  You have chest pain.  You have any allergic problems.   This information is not intended to replace advice given to you by your health care provider. Make sure you discuss any questions you have with your health care provider.   Document Released: 02/26/2001 Document Revised: 12/11/2014 Document Reviewed: 03/20/2012 Elsevier Interactive Patient Education [employer].

## 2016-07-11 NOTE — Interval H&P Note (Signed)
History and Physical Interval Note:  07/11/2016 4:15 PM  April [name]  has presented today for surgery, with the diagnosis of breast cancer  The various methods of treatment have been discussed with the patient and family. After consideration of risks, benefits and other options for treatment, the patient has consented to  Procedure(s): INSERTION PORT-A-CATH (N/A) as a surgical intervention .  The patient's history has been reviewed, patient examined, no change in status, stable for surgery.  I have reviewed the patient's chart and labs.  Questions were answered to the patient's satisfaction.     [name] T

## 2016-07-11 NOTE — Op Note (Signed)
Preoperative diagnosis: Cancer of the breast and the poor venous access  Postoperative diagnosis: Same  Procedure: Placement of ClearVue subcutaneous venous port  Surgeon: Excell Seltzer M.D.  Anesthesia: LMA General  Description of procedure: Patient is brought to the operating room and placed in the supine position on the operating table. IV sedation was administered. The entire upper chest and neck were widely sterilely prepped and draped. Local anesthesia was used to infiltrate the insertion of port site. The left subclavian vein vein was cannulated with a needle and guidewire without difficulty and position in the superior vena cava was confirmed by fluoroscopy. The introducer was then placed over the guidewire and the flushed catheter placed via the introducer which was stripped away and the tip of the catheter positioned near the cavoatrial junction. A small transverse incision was made in the anterior chest wall and subcutaneous pocket created. The catheter was tunneled into the pocket, trimmed to length, and attached to the flushed port which was positioned in the pocket. The port was sutured to the chest wall with interrupted 2-0 Prolene. The incisions were closed with subcutaneous interrupted Monocryl and the skin incisions closed with subcuticular Monocryl and Dermabond. The port was accessed and flushed and aspirated easily and was left flushed with concentrated heparin solution. Sponge needle as the counts were correct. The patient was taken to recovery in good condition.  April [name] T  07/11/2016

## 2016-07-11 NOTE — Anesthesia Procedure Notes (Signed)
Procedure Name: LMA Insertion Date/Time: 07/11/2016 4:20 PM Performed by: Cynda Familia Pre-anesthesia Checklist: Patient identified, Emergency Drugs available, Suction available and Patient being monitored Patient Re-evaluated:Patient Re-evaluated prior to inductionOxygen Delivery Method: Circle System Utilized Preoxygenation: Pre-oxygenation with 100% oxygen Intubation Type: IV induction Ventilation: Mask ventilation without difficulty LMA: LMA inserted LMA Size: 4.0 Tube type: Oral (20 c air) Number of attempts: 1 Placement Confirmation: positive ETCO2 Tube secured with: Tape Dental Injury: Teeth and Oropharynx as per pre-operative assessment  Comments: Smooth IV induction Ola Spurr present--- LMA AM CRNA atraumatic  Teeth and mouth as preop--- bilat BS Ola Spurr

## 2016-07-11 NOTE — Transfer of Care (Signed)
Immediate Anesthesia Transfer of Care Note  Patient: April [name]  Procedure(s) Performed: Procedure(s): INSERTION PORT-A-CATH (N/A)  Patient Location: PACU  Anesthesia Type:General  Level of Consciousness: awake  Airway & Oxygen Therapy: Patient Spontanous Breathing and Patient connected to face mask oxygen  Post-op Assessment: Report given to RN and Post -op Vital signs reviewed and stable  Post vital signs: Reviewed and stable  Last Vitals:  Vitals:   07/11/16 1444 07/11/16 1715  BP: (!) 144/87 (!) 142/85  Pulse: 74   Resp: 18 16  Temp: 37 C 36.6 C    Last Pain:  Vitals:   07/11/16 1444  TempSrc: Oral  PainSc: 4       Patients Stated Pain Goal: 3 ([id number])  Complications: No apparent anesthesia complications

## 2016-07-11 NOTE — Anesthesia Postprocedure Evaluation (Signed)
Anesthesia Post Note  Patient: April [name]  Procedure(s) Performed: Procedure(s) (LRB): INSERTION PORT-A-CATH (N/A)  Patient location during evaluation: PACU Anesthesia Type: General Level of consciousness: awake and alert Pain management: pain level controlled Vital Signs Assessment: post-procedure vital signs reviewed and stable Respiratory status: spontaneous breathing, nonlabored ventilation, respiratory function stable and patient connected to nasal cannula oxygen Cardiovascular status: blood pressure returned to baseline and stable Postop Assessment: no signs of nausea or vomiting Anesthetic complications: no    Last Vitals:  Vitals:   07/11/16 1730 07/11/16 1745  BP: 136/80 128/78  Pulse: 84 77  Resp: 16 20  Temp:      Last Pain:  Vitals:   07/11/16 1444  TempSrc: Oral  PainSc: 4                  Tiajuana Amass

## 2016-07-13 ENCOUNTER — Encounter (HOSPITAL_COMMUNITY): Payer: Self-pay | Admitting: General Surgery

## 2016-07-14 ENCOUNTER — Other Ambulatory Visit: Payer: Self-pay | Admitting: *Deleted

## 2016-07-14 ENCOUNTER — Encounter: Payer: Self-pay | Admitting: *Deleted

## 2016-07-14 ENCOUNTER — Telehealth: Payer: Self-pay | Admitting: Oncology

## 2016-07-14 ENCOUNTER — Ambulatory Visit (HOSPITAL_BASED_OUTPATIENT_CLINIC_OR_DEPARTMENT_OTHER)
Admission: RE | Admit: 2016-07-14 | Discharge: 2016-07-14 | Disposition: A | Discharge status: Home / Self Care | Payer: Medicaid Other | Source: Ambulatory Visit | Attending: Internal Medicine | Admitting: Internal Medicine

## 2016-07-14 ENCOUNTER — Ambulatory Visit (HOSPITAL_COMMUNITY)
Admission: RE | Admit: 2016-07-14 | Discharge: 2016-07-14 | Disposition: A | Discharge status: Home / Self Care | Payer: Medicaid Other | Source: Ambulatory Visit | Attending: Internal Medicine | Admitting: Internal Medicine

## 2016-07-14 ENCOUNTER — Ambulatory Visit (HOSPITAL_COMMUNITY)
Admission: RE | Admit: 2016-07-14 | Discharge: 2016-07-14 | Disposition: A | Discharge status: Home / Self Care | Payer: Medicaid Other | Source: Ambulatory Visit | Attending: Oncology | Admitting: Oncology

## 2016-07-14 ENCOUNTER — Encounter (HOSPITAL_COMMUNITY): Payer: Self-pay

## 2016-07-14 ENCOUNTER — Encounter (HOSPITAL_COMMUNITY): Payer: Self-pay | Admitting: Internal Medicine

## 2016-07-14 ENCOUNTER — Ambulatory Visit (HOSPITAL_BASED_OUTPATIENT_CLINIC_OR_DEPARTMENT_OTHER): Payer: Medicaid Other | Admitting: Oncology

## 2016-07-14 VITALS — BP 127/96 | HR 93 | Temp 97.6°F | Resp 18 | Ht 63.5 in | Wt 225.5 lb

## 2016-07-14 VITALS — BP 142/90 | HR 87 | Ht 63.5 in | Wt 226.7 lb

## 2016-07-14 DIAGNOSIS — C50911 Malignant neoplasm of unspecified site of right female breast: Secondary | ICD-10-CM

## 2016-07-14 DIAGNOSIS — Z170 Estrogen receptor positive status [ER+]: Secondary | ICD-10-CM | POA: Insufficient documentation

## 2016-07-14 DIAGNOSIS — Z825 Family history of asthma and other chronic lower respiratory diseases: Secondary | ICD-10-CM | POA: Insufficient documentation

## 2016-07-14 DIAGNOSIS — K219 Gastro-esophageal reflux disease without esophagitis: Secondary | ICD-10-CM | POA: Insufficient documentation

## 2016-07-14 DIAGNOSIS — F419 Anxiety disorder, unspecified: Secondary | ICD-10-CM | POA: Diagnosis not present

## 2016-07-14 DIAGNOSIS — Z87891 Personal history of nicotine dependence: Secondary | ICD-10-CM | POA: Diagnosis not present

## 2016-07-14 DIAGNOSIS — Z888 Allergy status to other drugs, medicaments and biological substances status: Secondary | ICD-10-CM | POA: Insufficient documentation

## 2016-07-14 DIAGNOSIS — Z885 Allergy status to narcotic agent status: Secondary | ICD-10-CM | POA: Insufficient documentation

## 2016-07-14 DIAGNOSIS — C50411 Malignant neoplasm of upper-outer quadrant of right female breast: Secondary | ICD-10-CM

## 2016-07-14 DIAGNOSIS — F329 Major depressive disorder, single episode, unspecified: Secondary | ICD-10-CM | POA: Diagnosis not present

## 2016-07-14 DIAGNOSIS — Z79899 Other long term (current) drug therapy: Secondary | ICD-10-CM | POA: Insufficient documentation

## 2016-07-14 DIAGNOSIS — M179 Osteoarthritis of knee, unspecified: Secondary | ICD-10-CM | POA: Diagnosis not present

## 2016-07-14 DIAGNOSIS — E669 Obesity, unspecified: Secondary | ICD-10-CM | POA: Diagnosis not present

## 2016-07-14 DIAGNOSIS — C773 Secondary and unspecified malignant neoplasm of axilla and upper limb lymph nodes: Secondary | ICD-10-CM

## 2016-07-14 DIAGNOSIS — Z6839 Body mass index (BMI) 39.0-39.9, adult: Secondary | ICD-10-CM | POA: Diagnosis not present

## 2016-07-14 DIAGNOSIS — N393 Stress incontinence (female) (male): Secondary | ICD-10-CM

## 2016-07-14 DIAGNOSIS — Z8249 Family history of ischemic heart disease and other diseases of the circulatory system: Secondary | ICD-10-CM | POA: Diagnosis not present

## 2016-07-14 DIAGNOSIS — M797 Fibromyalgia: Secondary | ICD-10-CM | POA: Diagnosis not present

## 2016-07-14 HISTORY — DX: Stress incontinence (female) (male): N39.3

## 2016-07-14 LAB — ECHOCARDIOGRAM COMPLETE
CHL CUP MV DEC (S): 148
CHL CUP RV SYS PRESS: 26 mmHg
CHL CUP TV REG PEAK VELOCITY: 240 cm/s
EERAT: 7.3
EWDT: 148 ms
FS: 24 % — AB (ref 28–44)
IV/PV OW: 0.85
LA ID, A-P, ES: 33 mm
LA vol: 51.1 mL
LADIAMINDEX: 1.62 cm/m2
LAVOLA4C: 46.4 mL
LAVOLIN: 25 mL/m2
LEFT ATRIUM END SYS DIAM: 33 mm
LV e' LATERAL: 11.5 cm/s
LVEEAVG: 7.3
LVEEMED: 7.3
LVOT area: 3.46 cm2
LVOT diameter: 21 mm
Lateral S' vel: 14.7 cm/s
MV Peak grad: 3 mmHg
MV pk E vel: 83.9 m/s
MVPKAVEL: 74.7 m/s
PW: 8.91 mm — AB (ref 0.6–1.1)
RV TAPSE: 22.2 mm
TDI e' lateral: 11.5
TDI e' medial: 8.49
TR max vel: 240 cm/s

## 2016-07-14 MED ORDER — DEXAMETHASONE 4 MG PO TABS: ORAL_TABLET | ORAL | 1 refills | Status: DC

## 2016-07-14 MED ORDER — LORAZEPAM 0.5 MG PO TABS: 0.5000 mg | ORAL_TABLET | Freq: Every evening | ORAL | 0 refills | Status: DC | PRN

## 2016-07-14 MED ORDER — GADOBENATE DIMEGLUMINE 529 MG/ML IV SOLN
20.0000 mL | Freq: Once | INTRAVENOUS | Status: AC | PRN
  Administered 2016-07-14: 20 mL via INTRAVENOUS

## 2016-07-14 MED ORDER — PROCHLORPERAZINE MALEATE 10 MG PO TABS: 10.0000 mg | ORAL_TABLET | Freq: Four times a day (QID) | ORAL | 1 refills | Status: DC | PRN

## 2016-07-14 MED ORDER — LIDOCAINE-PRILOCAINE 2.5-2.5 % EX CREA: 1.0000 "application " | TOPICAL_CREAM | CUTANEOUS | 0 refills | Status: DC | PRN

## 2016-07-14 NOTE — Progress Notes (Signed)
  Echocardiogram 2D Echocardiogram has been performed.  April [name] 07/14/2016, 9:51 AM

## 2016-07-14 NOTE — Patient Instructions (Signed)
Return in 6 weeks with echocardiogram and appointment with Dr. Haroldine Laws.  Do the following things EVERYDAY: 1) Weigh yourself in the morning before breakfast. Write it down and keep it in a log. 2) Take your medicines as prescribed 3) Eat low salt foods-Limit salt (sodium) to 2000 mg per day.  4) Stay as active as you can everyday 5) Limit all fluids for the day to less than 2 liters

## 2016-07-14 NOTE — Telephone Encounter (Signed)
appt made and avs printed °
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
## 2016-07-14 NOTE — Progress Notes (Signed)
[place]  [phone] [phone]     ID: April [name] DOB: 09-29-1961  MR#: [id number]  CBJ#:[id number]  Patient Care Team: [name], MD as PCP - General (Internal Medicine) Ephriam Jenkins, MD as Referring Physician (Allergy) Excell Seltzer, MD as Consulting Physician (General Surgery) Chauncey Cruel, MD as Consulting Physician (Oncology) [name], MD as Attending Physician (Radiation Oncology) OTHER MD:  CHIEF COMPLAINT: Estrogen receptor positive breast cancer  CURRENT TREATMENT: Neoadjuvant chemotherapy   BREAST CANCER HISTORY: April [name] herself noted a change in her right breast sometime in March or April 2017. She has a history of fibrocystic change and even though she saw her primary physician in the interval she forgot to mention the mass. She did mention that when she went for routinely scheduled mammography at the [facility] 06/15/2016, so she was changed from screening 2 diagnostic bilateral mammography with tomography and right breast ultrasonography. This found the breast density to be category B. The patient does have multiple masses in both breasts which were largely unchanged from prior. However there was an interval lobulated mass with ill-defined margins in the upper outer quadrant of the right breast, which was palpable. There were also multiple enlarged right axillary lymph nodes.  On exam there was a 2.5 cm firm rounded palpable mass at the 10:00 position of the right breast 12 cm from the nipple. There was no palpable axillary adenopathy. Ultrasonography confirmed a 2.8 cm irregular mass in the upper outer quadrant of the right breast. By ultrasound also there were multiple abnormal appearing right axillary lymph nodes, with diffuse cortical thickening. The largest measured 2.2 cm.  Biopsy of the right breast mass and a right axillary lymph node 06/21/2016 showed ([id number]) both biopsies to be positive for  invasive ductal carcinoma, grade 3, estrogen receptor positive at 95-100%, progesterone receptor positive at 80-90%, both with strong staining intensity, with an MIB-1 of 20-25%, and no HER-2 amplification, the signals ratio being 0.67-1.13, and the number per cell 1.20-2.25.  Her subsequent history is as detailed below  INTERVAL HISTORY: April [name] returns today for follow-up of her estrogen receptor positive breast cancer. She will be starting her chemotherapy 07/17/2016. In preparation for that she's had her echocardiogram, which shows an ejection fraction in the 60-65%. She came to chemotherapy school. She had a CT scan of the abdomen and pelvis which shows nonspecific findings in the right lower lobe and liver. These will have to be followed.  She was supposed to have had an MRI of the breast, but that was canceled because of funding issues. Her CT of the chest has also not been done, apparently because of scheduling problems. Finally, she had her port placed. That went without event.  REVIEW OF SYSTEMS: April [name] has occasional hot flashes. She has stress urinary incontinence. She continues to have pain in the right shoulder area (for port is on the left side was (. This is intermittent. Aside from these issues a detailed review of systems today was stable.  PAST MEDICAL HISTORY: Past Medical History:  Diagnosis Date  . Allergy   . Anxiety   . Arthritis    knees  . Bilateral ankle fractures 07/2015   [place]  . Cancer [facility]) dx June 22, 2016   right breast  . Depression    Multiple  episodes  in past.  . Fibromyalgia 2013   diagnosed by Dr. [name]  . Genital herpes 2005   Has outbreaks monthly  . GERD (gastroesophageal reflux  disease)   . Migraine   . Obesity   . Right wrist fracture 06/2015    PAST SURGICAL HISTORY: Past Surgical History:  Procedure Laterality Date  . PORTACATH PLACEMENT N/A 07/11/2016   Procedure: INSERTION PORT-A-CATH;  Surgeon: Excell Seltzer, MD;  Location:  [facility] ORS;  Service: General;  Laterality: N/A;  . WISDOM TOOTH EXTRACTION  yrs ago    FAMILY HISTORY Family History  Problem Relation Age of Onset  . Arthritis Mother   . Hypertension Mother   . Heart disease Mother   . Dementia Mother   . Irritable bowel syndrome Mother   . Emphysema Father   . Cancer Father     bladder  . Cerebral aneurysm Father     ruptured aneurysm was cause of death  . Bipolar disorder Daughter     Not clear if this is the case.  Possibly Bipolar II  . Depression Daughter   . Graves' disease Sister   . Vitiligo Sister   . Mental illness Brother     Depression  . Mental illness Sister     likely undiagnosed schizophrenia  . Mental illness Brother     Schizophrenia  The patient's father died from a ruptured brain aneurysm at the age of 8. He also had a history of bladder cancer. He was a smoker. The patient's mother is living, age 43 as of July 2017. The patient had 2 brothers, 2 sisters. There is no history of breast or ovarian cancer in the family.  GYNECOLOGIC HISTORY:  No LMP recorded. Patient is postmenopausal. Menarche age 30, first live birth age 69, the patient understands increases the risk of breast cancer. The patient stopped having menses June 2012. She did not use hormone replacement. She didn't take oral contraceptives for approximately 9 years remotely, with no complications.  SOCIAL HISTORY:  April [name] lives with her mother. She tells me she is the primary caregiver to her mother with Alzheimer's disease. The patient is not employed. The patient's husband [name] generally lives in [place] with his parents. He sometimes stays with the patient. She tells me he is a felon and this makes it hard for him to find a job. The patient's daughter, [name], lives in [place] where she works as a Chemical engineer for [employer]. The patient has no grandchildren. She is a Psychologist, forensic.    ADVANCED DIRECTIVES: Not in place. At the 06/28/2016 visit the  patient was given advanced directives to complete and notarize at her discretion.April [name] Kitchen  HEALTH MAINTENANCE: Social History  Substance Use Topics  . Smoking status: Former Smoker    Packs/day: 0.25    Years: 15.00    Types: Cigarettes    Quit date: 01/21/1994  . Smokeless tobacco: Never Used  . Alcohol use 1.2 - 2.4 oz/week    2 - 4 Standard drinks or equivalent per week     Comment: Couple beers or wine a week.     Colonoscopy:  PAP:  Bone density:   Allergies  Allergen Reactions  . Hydrocodone Nausea Only and Other (See Comments)    dizziness  . Ultram [Tramadol Hcl] Nausea Only  . Gabapentin Rash    Current Outpatient Prescriptions  Medication Sig Dispense Refill  . acyclovir (ZOVIRAX) 400 MG tablet 1 tab by mouth twice daily 60 tablet 11  . desloratadine (CLARINEX) 5 MG tablet Take 1 tablet (5 mg total) by mouth daily. (Patient taking differently: Take 5 mg by mouth every evening. ) 30 tablet 11  .  DULoxetine (CYMBALTA) 30 MG capsule 1 cap by mouth with 60  mg cap once daily for total of 90 mg daily (Patient taking differently: Take 30 mg by mouth See admin instructions. Take 1 capsule by mouth daily along with 60  mg cap once daily for total of 90 mg daily) 30 capsule 11  . DULoxetine (CYMBALTA) 60 MG capsule Take 1 capsule (60 mg total) by mouth daily. Take along with 30m capsule for a total of 922mof cymbalta per day. 30 capsule 11  . ibuprofen (ADVIL,MOTRIN) 600 MG tablet Take 1 tablet (600 mg total) by mouth every 6 (six) hours as needed. 30 tablet 0  . mometasone (NASONEX) 50 MCG/ACT nasal spray 2 sprays each nostril daily 17 g 11   No current facility-administered medications for this visit.     OBJECTIVE: Middle-aged white woman In no acute distress Vitals:   07/14/16 1311  BP: (!) 127/96  Pulse: 93  Resp: 18  Temp: 97.6 F (36.4 C)     Body mass index is 39.32 kg/m.    ECOG FS:0 - Asymptomatic Filed Weights   07/14/16 1311  Weight: 225 lb 8 oz (102.3 kg)     Sclerae unicteric, pupils round and equal Oropharynx clear and moist-- no thrush or other lesions No cervical or supraclavicular adenopathy Lungs no rales or rhonchi Heart regular rate and rhythm Abd soft, nontender, positive bowel sounds MSK no focal spinal tenderness, no upper extremity lymphedema Neuro: nonfocal, well oriented, appropriate affect Breasts: I found it difficult to palpate the right upper quadrant mass today. Some of what I felt previously may have been related to the recent biopsy. The right axilla is benign. The left breast is unremarkable.    LAB RESULTS:  CMP     Component Value Date/Time   NA 138 06/28/2016 1139   K 4.1 06/28/2016 1139   CL 108 07/14/2015 0544   CO2 20 (L) 06/28/2016 1139   GLUCOSE 100 06/28/2016 1139   BUN 15.6 06/28/2016 1139   CREATININE 0.8 06/28/2016 1139   CALCIUM 9.1 06/28/2016 1139   PROT 7.5 06/28/2016 1139   ALBUMIN 3.5 06/28/2016 1139   AST 17 06/28/2016 1139   ALT 16 06/28/2016 1139   ALKPHOS 131 06/28/2016 1139   BILITOT 0.35 06/28/2016 1139   GFRNONAA >60 07/14/2015 0544   GFRAA >60 07/14/2015 0544    INo results found for: SPEP, UPEP  Lab Results  Component Value Date   WBC 4.5 07/10/2016   NEUTROABS 3.3 06/28/2016   HGB 14.4 07/10/2016   HCT 43.2 07/10/2016   MCV 95.6 07/10/2016   PLT 298 07/10/2016      Chemistry      Component Value Date/Time   NA 138 06/28/2016 1139   K 4.1 06/28/2016 1139   CL 108 07/14/2015 0544   CO2 20 (L) 06/28/2016 1139   BUN 15.6 06/28/2016 1139   CREATININE 0.8 06/28/2016 1139      Component Value Date/Time   CALCIUM 9.1 06/28/2016 1139   ALKPHOS 131 06/28/2016 1139   AST 17 06/28/2016 1139   ALT 16 06/28/2016 1139   BILITOT 0.35 06/28/2016 1139       No results found for: LABCA2  No components found for: LABCA125  No results for input(s): INR in the last 168 hours.  Urinalysis    Component Value Date/Time   BILIRUBINUR negative 07/02/2012 1729    PROTEINUR negative 07/02/2012 1729   UROBILINOGEN 0.2 07/02/2012 1729   NITRITE negative 07/02/2012  [place] Negative 07/02/2012 1729     STUDIES: Nm Bone Scan Whole Body  Result Date: 07/11/2016 CLINICAL DATA:  RIGHT breast cancer, RIGHT shoulder pain EXAM: NUCLEAR MEDICINE WHOLE BODY BONE SCAN TECHNIQUE: Whole body anterior and posterior images were obtained approximately 3 hours after intravenous injection of radiopharmaceutical. RADIOPHARMACEUTICALS:  26.5 mCi Technetium-46mMDP IV COMPARISON:  None Radiographic correlation: CT abdomen and pelvis 07/11/2016, BILATERAL ankle radiographs 07/13/2015 FINDINGS: Minimal uptake at the shoulders, knees and feet typically degenerative. Increased uptake at lateral LEFT ankle corresponding to oblique distal fibular fracture on prior radiographs No definite additional sites of abnormal osseous tracer accumulation identified to suggest metastatic disease. Expected urinary tract and soft tissue distribution of tracer. IMPRESSION: No scintigraphic evidence of osseous metastatic disease. Electronically Signed   By: MLavonia DanaM.D.   On: 07/11/2016 14:49   Ct Abdomen Pelvis W Contrast  Result Date: 07/11/2016 CLINICAL DATA:  55year old female with newly diagnosed right breast cancer. Staging. EXAM: CT ABDOMEN AND PELVIS WITH CONTRAST TECHNIQUE: Multidetector CT imaging of the abdomen and pelvis was performed using the standard protocol following bolus administration of intravenous contrast. CONTRAST:  739mISOVUE-300 IOPAMIDOL (ISOVUE-300) INJECTION 61% COMPARISON:  None. FINDINGS: Lower chest: Solitary 4 mm solid right lower lobe pulmonary nodule (series 4/image 12). Hepatobiliary: Normal liver size. There are at least 4 tiny hypodense liver lesions scattered throughout the liver, largest 0.4 cm in the right liver lobe (series 602/image 68). Normal gallbladder with no radiopaque cholelithiasis. No biliary ductal dilatation. Pancreas: Normal, with no  mass or duct dilation. Spleen: Normal size. No mass. Adrenals/Urinary Tract: Normal adrenals. Normal kidneys with no hydronephrosis and no renal mass. Normal bladder. Stomach/Bowel: Grossly normal stomach. Normal caliber small bowel with no small bowel wall thickening. Normal appendix. Normal large bowel with no diverticulosis, large bowel wall thickening or pericolonic fat stranding. Vascular/Lymphatic: Normal caliber abdominal aorta. Patent portal, splenic, hepatic and renal veins. No pathologically enlarged lymph nodes in the abdomen or pelvis. Reproductive: Grossly normal uterus.  No adnexal mass. Other: No pneumoperitoneum, ascites or focal fluid collection. Musculoskeletal: No aggressive appearing focal osseous lesions. Subcentimeter sclerotic lesion in the left anterior acetabulum is nonspecific and most likely a benign bone island. Mild thoracolumbar spondylosis. IMPRESSION: 1. Solitary 4 mm right lower lobe solid pulmonary nodule, indeterminate. Consider a dedicated chest CT for complete chest staging. Otherwise, a follow-up chest CT is advised in 3 months. 2. No definite metastatic disease in the abdomen or pelvis. 3. Tiny low-attenuation liver lesions measuring up to 0.4 cm, too small to characterize. A follow-up MRI abdomen without and with IV contrast or CT abdomen with IV contrast is recommended in 6 months. This recommendation follows ACR consensus guidelines: Managing Incidental Findings on Abdominal CT: White Paper of the ACR Incidental Findings Committee. J Am Coll Radiol 2010;7:754-773. Electronically Signed   By: JaIlona Sorrel.D.   On: 07/11/2016 14:04   Dg Chest Port 1 View  Result Date: 07/11/2016 CLINICAL DATA:  Port-A-Cath insertion. EXAM: PORTABLE CHEST 1 VIEW COMPARISON:  04/17/2015 FINDINGS: Left anterior chest wall Port-A-Cath extends through the left subclavian vein. Tip lies in the mid superior vena cava. No pneumothorax. Lungs are clear.  Heart, mediastinum and hila are  unremarkable. IMPRESSION: 1. Port-A-Cath tip projects in the mid superior vena cava. No pneumothorax. 2. No acute cardiopulmonary disease. Electronically Signed   By: DaLajean Manes.D.   On: 07/11/2016 17:30   Mm Digital Diagnostic Unilat R  Result Date: 06/21/2016 CLINICAL  DATA:  Status post ultrasound-guided core biopsy of mass in the 10 o'clock location of the right breast and enlarged right axillary lymph node. EXAM: DIAGNOSTIC RIGHT MAMMOGRAM POST ULTRASOUND BIOPSY x2 COMPARISON:  Previous exam(s). FINDINGS: Mammographic images were obtained following ultrasound guided biopsy of mass in the 10 o'clock location of the right breast. A ribbon shaped clip is identified in the upper-outer quadrant of the right breast within an irregular mass previously evaluated. Within an enlarged right axillary lymph node, a coil shaped HydroMARK clip is present. IMPRESSION: Tissue marker clips are in expected locations following biopsy. Final Assessment: Post Procedure Mammograms for Marker Placement Electronically Signed   By: Nolon Nations M.D.   On: 06/21/2016 14:39   Dg C-arm 1-60 Min-no Report  Result Date: 07/11/2016 CLINICAL DATA: surgery C-ARM 1-60 MINUTES Fluoroscopy was utilized by the requesting physician.  No radiographic interpretation.   US Breast Ltd Uni Right Inc Axilla  Result Date: 06/15/2016 CLINICAL DATA:  Mass felt by the patient in the 11 o'clock position of the right breast for the past 3 months. EXAM: 2D DIGITAL DIAGNOSTIC BILATERAL MAMMOGRAM WITH CAD AND ADJUNCT TOMO ULTRASOUND RIGHT BREAST COMPARISON:  Previous exam(s). ACR Breast Density Category b: There are scattered areas of fibroglandular density. FINDINGS: Multiple rounded and oval, circumscribed masses are again demonstrated in both breasts. These demonstrate overall improvement with a significant decrease in size of multiple masses. There is an interval oval, lobulated mass with some mildly ill-defined margins and mild  architectural distortion in the posterior aspect of the upper-outer right breast, corresponding to the palpable mass, marked with a metallic marker. There are also multiple interval enlarged right axillary lymph nodes. No findings on the left suspicious for malignancy. Mammographic images were processed with CAD. On physical exam, there is an approximately 2.5 cm firm, rounded, palpable mass in the 10 o'clock position of the right breast, 12 cm from the nipple. There are no palpable right axillary lymph nodes. Targeted ultrasound is performed, showing a 2.8 x 2.7 x 2.5 cm oval, irregular mass with poorly defined margins in the 10 o'clock position of the right breast, 12 cm from the nipple. This is irregular and hypoechoic centrally and echogenic peripherally. This corresponds to the palpable mass, seen mammographically. Ultrasound of the right axilla demonstrated multiple abnormal appearing right axillary lymph nodes. The majority have diffuse cortical thickening with compression her loss of the normal fatty hilum. One has eccentric cortical thickening. The largest measures 2.2 x 1.8 x 1.8 cm. IMPRESSION: 1. 2.8 cm mass with imaging features highly suspicious for malignancy in the 10 o'clock position of the right breast, 12 cm from the nipple. 2. Multiple abnormal right axillary lymph nodes compatible with metastatic lymph nodes. RECOMMENDATION: Ultrasound-guided core needle biopsies of the 2.8 cm mass in the 10 o'clock position of the right breast and the largest abnormal appearing right axillary lymph node. These have been scheduled at 1:30 p.m. on 06/21/2016. I have discussed the findings and recommendations with the patient. Results were also provided in writing at the conclusion of the visit. If applicable, a reminder letter will be sent to the patient regarding the next appointment. BI-RADS CATEGORY  5: Highly suggestive of malignancy. Electronically Signed   By: Claudie Revering M.D.   On: 06/15/2016 11:31   Mm  Diag Breast Tomo Bilateral  Result Date: 06/15/2016 CLINICAL DATA:  Mass felt by the patient in the 11 o'clock position of the right breast for the past 3 months. EXAM: 2D DIGITAL DIAGNOSTIC  BILATERAL MAMMOGRAM WITH CAD AND ADJUNCT TOMO ULTRASOUND RIGHT BREAST COMPARISON:  Previous exam(s). ACR Breast Density Category b: There are scattered areas of fibroglandular density. FINDINGS: Multiple rounded and oval, circumscribed masses are again demonstrated in both breasts. These demonstrate overall improvement with a significant decrease in size of multiple masses. There is an interval oval, lobulated mass with some mildly ill-defined margins and mild architectural distortion in the posterior aspect of the upper-outer right breast, corresponding to the palpable mass, marked with a metallic marker. There are also multiple interval enlarged right axillary lymph nodes. No findings on the left suspicious for malignancy. Mammographic images were processed with CAD. On physical exam, there is an approximately 2.5 cm firm, rounded, palpable mass in the 10 o'clock position of the right breast, 12 cm from the nipple. There are no palpable right axillary lymph nodes. Targeted ultrasound is performed, showing a 2.8 x 2.7 x 2.5 cm oval, irregular mass with poorly defined margins in the 10 o'clock position of the right breast, 12 cm from the nipple. This is irregular and hypoechoic centrally and echogenic peripherally. This corresponds to the palpable mass, seen mammographically. Ultrasound of the right axilla demonstrated multiple abnormal appearing right axillary lymph nodes. The majority have diffuse cortical thickening with compression her loss of the normal fatty hilum. One has eccentric cortical thickening. The largest measures 2.2 x 1.8 x 1.8 cm. IMPRESSION: 1. 2.8 cm mass with imaging features highly suspicious for malignancy in the 10 o'clock position of the right breast, 12 cm from the nipple. 2. Multiple abnormal right  axillary lymph nodes compatible with metastatic lymph nodes. RECOMMENDATION: Ultrasound-guided core needle biopsies of the 2.8 cm mass in the 10 o'clock position of the right breast and the largest abnormal appearing right axillary lymph node. These have been scheduled at 1:30 p.m. on 06/21/2016. I have discussed the findings and recommendations with the patient. Results were also provided in writing at the conclusion of the visit. If applicable, a reminder letter will be sent to the patient regarding the next appointment. BI-RADS CATEGORY  5: Highly suggestive of malignancy. Electronically Signed   By: Claudie Revering M.D.   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 This was found to be concordant by Dr. Nolon Nations. Pathology results were discussed with the patient by telephone. The patient reported doing well after the biopsy. Post biopsy instructions and care were reviewed and questions were answered. The patient was encouraged to call [facility] for any additional concerns. The patient was referred to the [facility] at the [facility] on June 28, 2016. Pathology results reported by Susa Raring RN, BSN on June 23, 2016. Electronically Signed   By: Nolon Nations M.D.   On: 06/23/2016 07:36  Result Date: 06/23/2016 CLINICAL DATA:  Patient presents for ultrasound-guided core biopsy of mass within the 10 o'clock location of the right breast and enlarged right axillary lymph node. EXAM: ULTRASOUND GUIDED RIGHT BREAST CORE NEEDLE BIOPSY x2 COMPARISON:  Previous exam(s). FINDINGS: I met with the patient and we discussed the procedure of ultrasound-guided biopsy, including benefits  and alternatives. We discussed the high likelihood of a successful procedure. We discussed the risks of  the procedure, including infection, bleeding, tissue injury, clip migration, and inadequate sampling. Informed written consent was given. The usual time-out protocol was performed immediately prior to the procedure. Using sterile technique and 1% Lidocaine as local anesthetic, under direct ultrasound visualization, a 12 gauge spring-loaded device was used to perform biopsy of mass in the 10 o'clock location of the right breast 15 cm from the nipple using a lateral approach. At the conclusion of the procedure a ribbon shaped tissue marker clip was deployed into the biopsy cavity. Using the same technique, a 14 gauge spring-loaded device was used to perform biopsy of enlarged lower right axillary lymph node using a lateral approach. At the conclusion of the procedure a coil shaped tissue marker clip was deployed into the biopsy cavity. Follow up 2 view mammogram was performed and dictated separately. IMPRESSION: Ultrasound guided biopsy of right breast mass and enlarged right axillary lymph node. No apparent complications. Electronically Signed: By: Nolon Nations M.D. 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 This was found to be concordant by Dr. Nolon Nations. Pathology results were discussed with the patient by telephone. The patient reported doing well after the biopsy. Post biopsy instructions and care were reviewed and questions were answered. The patient was encouraged to call [facility] for any additional concerns. The patient was referred to the [facility] at the [facility] on June 28, 2016. Pathology results reported by Susa Raring RN, BSN on June 23, 2016. Electronically Signed   By: Nolon Nations M.D.   On: 06/23/2016 07:36  Result Date: 06/23/2016 CLINICAL DATA:  Patient presents for ultrasound-guided core biopsy of mass within the 10 o'clock location of the right breast and enlarged right axillary lymph node. EXAM: ULTRASOUND GUIDED RIGHT BREAST CORE NEEDLE BIOPSY x2 COMPARISON:  Previous exam(s). FINDINGS: I met with the patient and we discussed the procedure of ultrasound-guided biopsy, including benefits and alternatives. We discussed the high likelihood of a successful procedure. We discussed the risks of the procedure, including infection, bleeding, tissue injury, clip migration, and inadequate sampling. Informed written consent was given. The usual time-out protocol was performed immediately prior to the procedure. Using sterile technique and 1% Lidocaine as local anesthetic, under direct ultrasound visualization, a 12 gauge spring-loaded device was used to perform biopsy of mass in the 10 o'clock location of the right breast 15 cm from the nipple using a lateral approach. At the conclusion of the procedure a ribbon shaped tissue marker clip was deployed into the biopsy cavity. Using the same technique, a 14 gauge spring-loaded device was used to perform biopsy of enlarged lower right axillary lymph node using a lateral approach. At the conclusion of the procedure a coil shaped tissue marker clip was deployed into the biopsy cavity. Follow up 2 view mammogram was performed and dictated separately. IMPRESSION: Ultrasound guided biopsy of right breast mass and enlarged right axillary lymph node. No apparent complications. Electronically Signed: By: Nolon Nations M.D. On: 06/21/2016 14:11    ELIGIBLE FOR AVAILABLE RESEARCH PROTOCOL: [name], [facility]  ASSESSMENT: 55 y.o. [place] woman status post right breast upper outer quadrant and right axillary lymph  node biopsy 06/21/2016, both positive for  a clinical T2 N2,stage IIIA  invasive ductal carcinoma, grade 3, estrogen and progesterone receptor positive, HER-2 nonamplified, with an MIB-1 between 20 and 25%   (1) neoadjuvant chemotherapy to consist of doxorubicin and cyclophosphamide in dose dense fashion 4, followed by weekly paclitaxel 12  (2) definitive surgery to follow chemotherapy, with consideration of targeted axillary dissection versus participation in the Alliance trial  (3) adjuvant radiation to follow surgery  (4) anti-estrogens to follow at the completion of local treatment   PLAN: We reviewed Elide's bone scan, which is negative, and her CT of the abdomen and pelvis, which shows very small nonspecific findings at the base of the right lung and in the liver. These are very likely benign, but there is some possibility that they could not be. She understands these will need follow-up we also reviewed her echocardiogram which is excellent.   Her chest CT was not done apparently because of scheduling problems and the and her breast MRI has not yet been done but has been rescheduled for tonight.   We discussed at length how she should take her supportive medications. I gave her a "roadmap" on how to take it as well as all the appropriate prescriptions.  I am going to see her on day 8 just to make sure the first cycle went through well. I urged her to call us with any concerns or questions.       Chauncey Cruel, MD   07/14/2016 1:45 PM Medical Oncology and Hematology [facility] [address] [place], [place] [address] Tel. [phone]    Fax. [phone]

## 2016-07-17 ENCOUNTER — Encounter: Payer: Self-pay | Admitting: Oncology

## 2016-07-17 ENCOUNTER — Ambulatory Visit (HOSPITAL_BASED_OUTPATIENT_CLINIC_OR_DEPARTMENT_OTHER): Payer: Medicaid Other

## 2016-07-17 ENCOUNTER — Ambulatory Visit: Payer: No Typology Code available for payment source | Admitting: Internal Medicine

## 2016-07-17 ENCOUNTER — Encounter: Payer: Self-pay | Admitting: *Deleted

## 2016-07-17 ENCOUNTER — Ambulatory Visit: Payer: Self-pay

## 2016-07-17 ENCOUNTER — Other Ambulatory Visit: Payer: Self-pay | Admitting: *Deleted

## 2016-07-17 ENCOUNTER — Other Ambulatory Visit (HOSPITAL_BASED_OUTPATIENT_CLINIC_OR_DEPARTMENT_OTHER): Payer: Medicaid Other

## 2016-07-17 ENCOUNTER — Telehealth: Payer: Self-pay | Admitting: Oncology

## 2016-07-17 VITALS — BP 135/91 | HR 77 | Temp 98.2°F | Resp 17

## 2016-07-17 DIAGNOSIS — Z5111 Encounter for antineoplastic chemotherapy: Secondary | ICD-10-CM | POA: Diagnosis present

## 2016-07-17 DIAGNOSIS — C50911 Malignant neoplasm of unspecified site of right female breast: Secondary | ICD-10-CM

## 2016-07-17 DIAGNOSIS — C773 Secondary and unspecified malignant neoplasm of axilla and upper limb lymph nodes: Secondary | ICD-10-CM | POA: Diagnosis not present

## 2016-07-17 DIAGNOSIS — C50411 Malignant neoplasm of upper-outer quadrant of right female breast: Secondary | ICD-10-CM | POA: Diagnosis not present

## 2016-07-17 DIAGNOSIS — Z5189 Encounter for other specified aftercare: Secondary | ICD-10-CM | POA: Diagnosis not present

## 2016-07-17 LAB — CBC WITH DIFFERENTIAL/PLATELET
BASO%: 0.6 % (ref 0.0–2.0)
Basophils Absolute: 0 10*3/uL (ref 0.0–0.1)
EOS ABS: 0.1 10*3/uL (ref 0.0–0.5)
EOS%: 2.6 % (ref 0.0–7.0)
HCT: 40.2 % (ref 34.8–46.6)
HGB: 13.1 g/dL (ref 11.6–15.9)
LYMPH%: 20 % (ref 14.0–49.7)
MCH: 31.4 pg (ref 25.1–34.0)
MCHC: 32.7 g/dL (ref 31.5–36.0)
MCV: 96 fL (ref 79.5–101.0)
MONO#: 0.4 10*3/uL (ref 0.1–0.9)
MONO%: 8.6 % (ref 0.0–14.0)
NEUT#: 2.9 10*3/uL (ref 1.5–6.5)
NEUT%: 68.2 % (ref 38.4–76.8)
PLATELETS: 246 10*3/uL (ref 145–400)
RBC: 4.19 10*6/uL (ref 3.70–5.45)
RDW: 13.7 % (ref 11.2–14.5)
WBC: 4.2 10*3/uL (ref 3.9–10.3)
lymph#: 0.8 10*3/uL — ABNORMAL LOW (ref 0.9–3.3)

## 2016-07-17 LAB — COMPREHENSIVE METABOLIC PANEL
ALT: 17 U/L (ref 0–55)
ANION GAP: 9 meq/L (ref 3–11)
AST: 18 U/L (ref 5–34)
Albumin: 3.3 g/dL — ABNORMAL LOW (ref 3.5–5.0)
Alkaline Phosphatase: 107 U/L (ref 40–150)
BUN: 13.8 mg/dL (ref 7.0–26.0)
CHLORIDE: 107 meq/L (ref 98–109)
CO2: 24 meq/L (ref 22–29)
Calcium: 9.1 mg/dL (ref 8.4–10.4)
Creatinine: 0.8 mg/dL (ref 0.6–1.1)
Glucose: 84 mg/dl (ref 70–140)
POTASSIUM: 3.8 meq/L (ref 3.5–5.1)
Sodium: 140 mEq/L (ref 136–145)
Total Bilirubin: 0.31 mg/dL (ref 0.20–1.20)
Total Protein: 6.9 g/dL (ref 6.4–8.3)

## 2016-07-17 MED ORDER — HEPARIN SOD (PORK) LOCK FLUSH 100 UNIT/ML IV SOLN
500.0000 [IU] | Freq: Once | INTRAVENOUS | Status: AC | PRN
  Administered 2016-07-17: 500 [IU]
  Filled 2016-07-17: qty 5

## 2016-07-17 MED ORDER — PALONOSETRON HCL INJECTION 0.25 MG/5ML
0.2500 mg | Freq: Once | INTRAVENOUS | Status: AC
  Administered 2016-07-17: 0.25 mg via INTRAVENOUS

## 2016-07-17 MED ORDER — DOXORUBICIN HCL CHEMO IV INJECTION 2 MG/ML
60.0000 mg/m2 | Freq: Once | INTRAVENOUS | Status: AC
  Administered 2016-07-17: 130 mg via INTRAVENOUS
  Filled 2016-07-17: qty 65

## 2016-07-17 MED ORDER — SODIUM CHLORIDE 0.9% FLUSH
10.0000 mL | INTRAVENOUS | Status: DC | PRN
  Administered 2016-07-17: 10 mL
  Filled 2016-07-17: qty 10

## 2016-07-17 MED ORDER — SODIUM CHLORIDE 0.9 % IV SOLN
Freq: Once | INTRAVENOUS | Status: AC
  Administered 2016-07-17: 15:00:00 via INTRAVENOUS

## 2016-07-17 MED ORDER — PALONOSETRON HCL INJECTION 0.25 MG/5ML
INTRAVENOUS | Status: AC
  Filled 2016-07-17: qty 5

## 2016-07-17 MED ORDER — PEGFILGRASTIM 6 MG/0.6ML ~~LOC~~ PSKT
6.0000 mg | PREFILLED_SYRINGE | Freq: Once | SUBCUTANEOUS | Status: AC
  Administered 2016-07-17: 6 mg via SUBCUTANEOUS
  Filled 2016-07-17: qty 0.6

## 2016-07-17 MED ORDER — SODIUM CHLORIDE 0.9 % IV SOLN
600.0000 mg/m2 | Freq: Once | INTRAVENOUS | Status: AC
  Administered 2016-07-17: 1300 mg via INTRAVENOUS
  Filled 2016-07-17: qty 65

## 2016-07-17 MED ORDER — SODIUM CHLORIDE 0.9 % IV SOLN
Freq: Once | INTRAVENOUS | Status: AC
  Administered 2016-07-17: 15:00:00 via INTRAVENOUS
  Filled 2016-07-17: qty 5

## 2016-07-17 NOTE — Telephone Encounter (Signed)
We have made 3 attempts to contact this patient. We are sending a letter out for the patient to call and schedule appointment.

## 2016-07-17 NOTE — Patient Instructions (Signed)
[facility] Discharge Instructions for Patients Receiving Chemotherapy  Today you received the following chemotherapy agents:  Cytoxan, Adriamycin  To help prevent nausea and vomiting after your treatment, we encourage you to take your nausea medication as prescribed.   If you develop nausea and vomiting that is not controlled by your nausea medication, call the clinic.   BELOW ARE SYMPTOMS THAT SHOULD BE REPORTED IMMEDIATELY:  *FEVER GREATER THAN 100.5 F  *CHILLS WITH OR WITHOUT FEVER  NAUSEA AND VOMITING THAT IS NOT CONTROLLED WITH YOUR NAUSEA MEDICATION  *UNUSUAL SHORTNESS OF BREATH  *UNUSUAL BRUISING OR BLEEDING  TENDERNESS IN MOUTH AND THROAT WITH OR WITHOUT PRESENCE OF ULCERS  *URINARY PROBLEMS  *BOWEL PROBLEMS  UNUSUAL RASH Items with * indicate a potential emergency and should be followed up as soon as possible.  Feel free to call the clinic you have any questions or concerns. The clinic phone number is [phone].  Please show the [facility] at check-in to the Emergency Department and triage nurse.  Pegfilgrastim injection What is this medicine? PEGFILGRASTIM (PEG fil gra stim) is a long-acting granulocyte colony-stimulating factor that stimulates the growth of neutrophils, a type of white blood cell important in the body's fight against infection. It is used to reduce the incidence of fever and infection in patients with certain types of cancer who are receiving chemotherapy that affects the bone marrow, and to increase survival after being exposed to high doses of radiation. This medicine may be used for other purposes; ask your health care provider or pharmacist if you have questions. What should I tell my health care provider before I take this medicine? They need to know if you have any of these conditions: -kidney disease -latex allergy -ongoing radiation therapy -sickle cell disease -skin reactions to acrylic adhesives (On-Body  Injector only) -an unusual or allergic reaction to pegfilgrastim, filgrastim, other medicines, foods, dyes, or preservatives -pregnant or trying to get pregnant -breast-feeding How should I use this medicine? This medicine is for injection under the skin. If you get this medicine at home, you will be taught how to prepare and give the pre-filled syringe or how to use the On-body Injector. Refer to the patient Instructions for Use for detailed instructions. Use exactly as directed. Take your medicine at regular intervals. Do not take your medicine more often than directed. It is important that you put your used needles and syringes in a special sharps container. Do not put them in a trash can. If you do not have a sharps container, call your pharmacist or healthcare provider to get one. Talk to your pediatrician regarding the use of this medicine in children. While this drug may be prescribed for selected conditions, precautions do apply. Overdosage: If you think you have taken too much of this medicine contact a poison control center or emergency room at once. NOTE: This medicine is only for you. Do not share this medicine with others. What if I miss a dose? It is important not to miss your dose. Call your doctor or health care professional if you miss your dose. If you miss a dose due to an On-body Injector failure or leakage, a new dose should be administered as soon as possible using a single prefilled syringe for manual use. What may interact with this medicine? Interactions have not been studied. Give your health care provider a list of all the medicines, herbs, non-prescription drugs, or dietary supplements you use. Also tell them if you smoke, drink  alcohol, or use illegal drugs. Some items may interact with your medicine. This list may not describe all possible interactions. Give your health care provider a list of all the medicines, herbs, non-prescription drugs, or dietary supplements you  use. Also tell them if you smoke, drink alcohol, or use illegal drugs. Some items may interact with your medicine. What should I watch for while using this medicine? You may need blood work done while you are taking this medicine. If you are going to need a MRI, CT scan, or other procedure, tell your doctor that you are using this medicine (On-Body Injector only). What side effects may I notice from receiving this medicine? Side effects that you should report to your doctor or health care professional as soon as possible: -allergic reactions like skin rash, itching or hives, swelling of the face, lips, or tongue -dizziness -fever -pain, redness, or irritation at site where injected -pinpoint red spots on the skin -red or dark-brown urine -shortness of breath or breathing problems -stomach or side pain, or pain at the shoulder -swelling -tiredness -trouble passing urine or change in the amount of urine Side effects that usually do not require medical attention (report to your doctor or health care professional if they continue or are bothersome): -bone pain -muscle pain This list may not describe all possible side effects. Call your doctor for medical advice about side effects. You may report side effects to FDA at 1-800-FDA-1088. Where should I keep my medicine? Keep out of the reach of children. Store pre-filled syringes in a refrigerator between 2 and 8 degrees C (36 and 46 degrees F). Do not freeze. Keep in carton to protect from light. Throw away this medicine if it is left out of the refrigerator for more than 48 hours. Throw away any unused medicine after the expiration date. NOTE: This sheet is a summary. It may not cover all possible information. If you have questions about this medicine, talk to your doctor, pharmacist, or health care provider.    2016, Elsevier/Gold Standard. (2014-12-10 14:30:14) Doxorubicin injection What is this medicine? DOXORUBICIN (dox oh ROO bi sin) is  a chemotherapy drug. It is used to treat many kinds of cancer like Hodgkin's disease, leukemia, non-Hodgkin's lymphoma, neuroblastoma, sarcoma, and Wilms' tumor. It is also used to treat bladder cancer, breast cancer, lung cancer, ovarian cancer, stomach cancer, and thyroid cancer. This medicine may be used for other purposes; ask your health care provider or pharmacist if you have questions. What should I tell my health care provider before I take this medicine? They need to know if you have any of these conditions: -blood disorders -heart disease, recent heart attack -infection (especially a virus infection such as chickenpox, cold sores, or herpes) -irregular heartbeat -liver disease -recent or ongoing radiation therapy -an unusual or allergic reaction to doxorubicin, other chemotherapy agents, other medicines, foods, dyes, or preservatives -pregnant or trying to get pregnant -breast-feeding How should I use this medicine? This drug is given as an infusion into a vein. It is administered in a hospital or clinic by a specially trained health care professional. If you have pain, swelling, burning or any unusual feeling around the site of your injection, tell your health care professional right away. Talk to your pediatrician regarding the use of this medicine in children. Special care may be needed. Overdosage: If you think you have taken too much of this medicine contact a poison control center or emergency room at once. NOTE: This medicine is only for  you. Do not share this medicine with others. What if I miss a dose? It is important not to miss your dose. Call your doctor or health care professional if you are unable to keep an appointment. What may interact with this medicine? Do not take this medicine with any of the following medications: -cisapride -droperidol -halofantrine -pimozide -zidovudine This medicine may also interact with the following  medications: -chloroquine -chlorpromazine -clarithromycin -cyclophosphamide -cyclosporine -erythromycin -medicines for depression, anxiety, or psychotic disturbances -medicines for irregular heart beat like amiodarone, bepridil, dofetilide, encainide, flecainide, propafenone, quinidine -medicines for seizures like ethotoin, fosphenytoin, phenytoin -medicines for nausea, vomiting like dolasetron, ondansetron, palonosetron -medicines to increase blood counts like filgrastim, pegfilgrastim, sargramostim -methadone -methotrexate -pentamidine -progesterone -vaccines -verapamil Talk to your doctor or health care professional before taking any of these medicines: -acetaminophen -aspirin -ibuprofen -ketoprofen -naproxen This list may not describe all possible interactions. Give your health care provider a list of all the medicines, herbs, non-prescription drugs, or dietary supplements you use. Also tell them if you smoke, drink alcohol, or use illegal drugs. Some items may interact with your medicine. What should I watch for while using this medicine? Your condition will be monitored carefully while you are receiving this medicine. You will need important blood work done while you are taking this medicine. This drug may make you feel generally unwell. This is not uncommon, as chemotherapy can affect healthy cells as well as cancer cells. Report any side effects. Continue your course of treatment even though you feel ill unless your doctor tells you to stop. Your urine may turn red for a few days after your dose. This is not blood. If your urine is dark or brown, call your doctor. In some cases, you may be given additional medicines to help with side effects. Follow all directions for their use. Call your doctor or health care professional for advice if you get a fever, chills or sore throat, or other symptoms of a cold or flu. Do not treat yourself. This drug decreases your body's ability to  fight infections. Try to avoid being around people who are sick. This medicine may increase your risk to bruise or bleed. Call your doctor or health care professional if you notice any unusual bleeding. Be careful brushing and flossing your teeth or using a toothpick because you may get an infection or bleed more easily. If you have any dental work done, tell your dentist you are receiving this medicine. Avoid taking products that contain aspirin, acetaminophen, ibuprofen, naproxen, or ketoprofen unless instructed by your doctor. These medicines may hide a fever. Men and women of childbearing age should use effective birth control methods while using taking this medicine. Do not become pregnant while taking this medicine. There is a potential for serious side effects to an unborn child. Talk to your health care professional or pharmacist for more information. Do not breast-feed an infant while taking this medicine. Do not let others touch your urine or other body fluids for 5 days after each treatment with this medicine. Caregivers should wear latex gloves to avoid touching body fluids during this time. There is a maximum amount of this medicine you should receive throughout your life. The amount depends on the medical condition being treated and your overall health. Your doctor will watch how much of this medicine you receive in your lifetime. Tell your doctor if you have taken this medicine before. What side effects may I notice from receiving this medicine? Side effects that you should  report to your doctor or health care professional as soon as possible: -allergic reactions like skin rash, itching or hives, swelling of the face, lips, or tongue -low blood counts - this medicine may decrease the number of white blood cells, red blood cells and platelets. You may be at increased risk for infections and bleeding. -signs of infection - fever or chills, cough, sore throat, pain or difficulty passing  urine -signs of decreased platelets or bleeding - bruising, pinpoint red spots on the skin, black, tarry stools, blood in the urine -signs of decreased red blood cells - unusually weak or tired, fainting spells, lightheadedness -breathing problems -chest pain -fast, irregular heartbeat -mouth sores -nausea, vomiting -pain, swelling, redness at site where injected -pain, tingling, numbness in the hands or feet -swelling of ankles, feet, or hands -unusual bleeding or bruising Side effects that usually do not require medical attention (report to your doctor or health care professional if they continue or are bothersome): -diarrhea -facial flushing -hair loss -loss of appetite -missed menstrual periods -nail discoloration or damage -red or watery eyes -red colored urine -stomach upset This list may not describe all possible side effects. Call your doctor for medical advice about side effects. You may report side effects to FDA at 1-800-FDA-1088. Where should I keep my medicine? This drug is given in a hospital or clinic and will not be stored at home. NOTE: This sheet is a summary. It may not cover all possible information. If you have questions about this medicine, talk to your doctor, pharmacist, or health care provider.    2016, Elsevier/Gold Standard. (2013-03-18 09:54:34) Cyclophosphamide injection What is this medicine? CYCLOPHOSPHAMIDE (sye kloe FOSS fa mide) is a chemotherapy drug. It slows the growth of cancer cells. This medicine is used to treat many types of cancer like lymphoma, myeloma, leukemia, breast cancer, and ovarian cancer, to name a few. This medicine may be used for other purposes; ask your health care provider or pharmacist if you have questions. What should I tell my health care provider before I take this medicine? They need to know if you have any of these conditions: -blood disorders -history of other chemotherapy -infection -kidney disease -liver  disease -recent or ongoing radiation therapy -tumors in the bone marrow -an unusual or allergic reaction to cyclophosphamide, other chemotherapy, other medicines, foods, dyes, or preservatives -pregnant or trying to get pregnant -breast-feeding How should I use this medicine? This drug is usually given as an injection into a vein or muscle or by infusion into a vein. It is administered in a hospital or clinic by a specially trained health care professional. Talk to your pediatrician regarding the use of this medicine in children. Special care may be needed. Overdosage: If you think you have taken too much of this medicine contact a poison control center or emergency room at once. NOTE: This medicine is only for you. Do not share this medicine with others. What if I miss a dose? It is important not to miss your dose. Call your doctor or health care professional if you are unable to keep an appointment. What may interact with this medicine? This medicine may interact with the following medications: -amiodarone -amphotericin B -azathioprine -certain antiviral medicines for HIV or AIDS such as protease inhibitors (e.g., indinavir, ritonavir) and zidovudine -certain blood pressure medications such as benazepril, captopril, enalapril, fosinopril, lisinopril, moexipril, monopril, perindopril, quinapril, ramipril, trandolapril -certain cancer medications such as anthracyclines (e.g., daunorubicin, doxorubicin), busulfan, cytarabine, paclitaxel, pentostatin, tamoxifen, trastuzumab -certain diuretics such  as chlorothiazide, chlorthalidone, hydrochlorothiazide, indapamide, metolazone -certain medicines that treat or prevent blood clots like warfarin -certain muscle relaxants such as succinylcholine -cyclosporine -etanercept -indomethacin -medicines to increase blood counts like filgrastim, pegfilgrastim, sargramostim -medicines used as general anesthesia -metronidazole -natalizumab This list may  not describe all possible interactions. Give your health care provider a list of all the medicines, herbs, non-prescription drugs, or dietary supplements you use. Also tell them if you smoke, drink alcohol, or use illegal drugs. Some items may interact with your medicine. What should I watch for while using this medicine? Visit your doctor for checks on your progress. This drug may make you feel generally unwell. This is not uncommon, as chemotherapy can affect healthy cells as well as cancer cells. Report any side effects. Continue your course of treatment even though you feel ill unless your doctor tells you to stop. Drink water or other fluids as directed. Urinate often, even at night. In some cases, you may be given additional medicines to help with side effects. Follow all directions for their use. Call your doctor or health care professional for advice if you get a fever, chills or sore throat, or other symptoms of a cold or flu. Do not treat yourself. This drug decreases your body's ability to fight infections. Try to avoid being around people who are sick. This medicine may increase your risk to bruise or bleed. Call your doctor or health care professional if you notice any unusual bleeding. Be careful brushing and flossing your teeth or using a toothpick because you may get an infection or bleed more easily. If you have any dental work done, tell your dentist you are receiving this medicine. You may get drowsy or dizzy. Do not drive, use machinery, or do anything that needs mental alertness until you know how this medicine affects you. Do not become pregnant while taking this medicine or for 1 year after stopping it. Women should inform their doctor if they wish to become pregnant or think they might be pregnant. Men should not father a child while taking this medicine and for 4 months after stopping it. There is a potential for serious side effects to an unborn child. Talk to your health care  professional or pharmacist for more information. Do not breast-feed an infant while taking this medicine. This medicine may interfere with the ability to have a child. This medicine has caused ovarian failure in some women. This medicine has caused reduced sperm counts in some men. You should talk with your doctor or health care professional if you are concerned about your fertility. If you are going to have surgery, tell your doctor or health care professional that you have taken this medicine. What side effects may I notice from receiving this medicine? Side effects that you should report to your doctor or health care professional as soon as possible: -allergic reactions like skin rash, itching or hives, swelling of the face, lips, or tongue -low blood counts - this medicine may decrease the number of white blood cells, red blood cells and platelets. You may be at increased risk for infections and bleeding. -signs of infection - fever or chills, cough, sore throat, pain or difficulty passing urine -signs of decreased platelets or bleeding - bruising, pinpoint red spots on the skin, black, tarry stools, blood in the urine -signs of decreased red blood cells - unusually weak or tired, fainting spells, lightheadedness -breathing problems -dark urine -dizziness -palpitations -swelling of the ankles, feet, hands -trouble passing urine or  change in the amount of urine -weight gain -yellowing of the eyes or skin Side effects that usually do not require medical attention (report to your doctor or health care professional if they continue or are bothersome): -changes in nail or skin color -hair loss -missed menstrual periods -mouth sores -nausea, vomiting This list may not describe all possible side effects. Call your doctor for medical advice about side effects. You may report side effects to FDA at 1-800-FDA-1088. Where should I keep my medicine? This drug is given in a hospital or clinic and  will not be stored at home. NOTE: This sheet is a summary. It may not cover all possible information. If you have questions about this medicine, talk to your doctor, pharmacist, or health care provider.    2016, Elsevier/Gold Standard. (2012-10-04 16:22:58)

## 2016-07-17 NOTE — Progress Notes (Signed)
Met with patient in office after infusion to discuss [employer]. Patient has no income. Patient approved for one-time $1000 [employer]. Patient given a copy of award as well as expenses it covers and information for our outpatient pharmacy. Patient has an RX to pick up and will take to outpatient pharmacy to have filled there to be able to utilize grant. Patient has my card for any additional financial questions or concerns. 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

## 2016-07-18 ENCOUNTER — Telehealth: Payer: Self-pay

## 2016-07-18 NOTE — Telephone Encounter (Signed)
Called for third health coaching session twice. There was no voicemail or pick up when called both times.

## 2016-07-19 NOTE — Progress Notes (Signed)
When patient came by office and stated had breast cancer and it was in a lymph node. It was decided that would not pursue further health coaching at this time. Patient is aware that if wants to continue can call.

## 2016-07-21 ENCOUNTER — Ambulatory Visit (HOSPITAL_COMMUNITY): Payer: No Typology Code available for payment source

## 2016-07-21 ENCOUNTER — Other Ambulatory Visit: Payer: Self-pay

## 2016-07-21 DIAGNOSIS — C50919 Malignant neoplasm of unspecified site of unspecified female breast: Secondary | ICD-10-CM

## 2016-07-23 NOTE — Progress Notes (Signed)
[name]  Telephone:([phone] [phone]     ID: [name] DOB: 1961-11-17  MR#: [id number]  YTM#:[id number]  Patient Care Team: April Manson, MD as PCP - General (Internal Medicine) April Mink, MD as Referring Physician (Allergy) April Fellows, MD as Consulting Physician (General Surgery) April Dell, MD as Consulting Physician (Oncology) April Peak, MD as Attending Physician (Radiation Oncology) OTHER MD:  CHIEF COMPLAINT: Estrogen receptor positive breast cancer  CURRENT TREATMENT: Neoadjuvant chemotherapy   BREAST CANCER HISTORY:  From the original intake note:  [name] herself noted a change in her right breast sometime in March or April 2017. She has a history of fibrocystic change and even though she saw her primary physician in the interval she forgot to mention the mass. She did mention that when she went for routinely scheduled mammography at the [name] 06/15/2016, so she was changed from screening 2 diagnostic bilateral mammography with tomography and right breast ultrasonography. This found the breast density to be category B. The patient does have multiple masses in both breasts which were largely unchanged from prior. However there was an interval lobulated mass with ill-defined margins in the upper outer quadrant of the right breast, which was palpable. There were also multiple enlarged right axillary lymph nodes.  On exam there was a 2.5 cm firm rounded palpable mass at the 10:00 position of the right breast 12 cm from the nipple. There was no palpable axillary adenopathy. Ultrasonography confirmed a 2.8 cm irregular mass in the upper outer quadrant of the right breast. By ultrasound also there were multiple abnormal appearing right axillary lymph nodes, with diffuse cortical thickening. The largest measured 2.2 cm.  Biopsy of the right breast mass and a right axillary lymph node 06/21/2016 showed ([id number])  both biopsies to be positive for invasive ductal carcinoma, grade 3, estrogen receptor positive at 95-100%, progesterone receptor positive at 80-90%, both with strong staining intensity, with an MIB-1 of 20-25%, and no HER-2 amplification, the signals ratio being 0.67-1.13, and the number per cell 1.20-2.25.  Her subsequent history is as detailed below  INTERVAL HISTORY: [name] returns today for follow-up of her estrogen receptor positive breast cancer Accompanied by her husband [name]. Today is day 8 cycle 1 of 4 cycles of cyclophosphamide and doxorubicin given every 2 weeks with OnPro support, to be followed by paclitaxel 12.  REVIEW OF SYSTEMS: [name] had moderate difficulty with her first cycle of chemotherapy. She had significant reflux. She has severe migraines which were persistent. She had pain in her right nostril, without significant epistaxis. She was very fatigued. She did not have significant problems with nausea or vomiting. She was minimally constipated and she took care of that with prune juice. She had "1 mouth sore". She feels forgetful but not anxious or depressed. A detailed review of systems today was otherwise stable   PAST MEDICAL HISTORY: Past Medical History:  Diagnosis Date  . Allergy   . Anxiety   . Arthritis    knees  . Bilateral ankle fractures 07/2015   Booted  . Cancer [name]) dx June 22, 2016   right breast  . Depression    Multiple  episodes  in past.  . Fibromyalgia 2013   diagnosed by Dr. Corliss [name]  . Genital herpes 2005   Has outbreaks monthly  . GERD (gastroesophageal reflux disease)   . Migraine   . Obesity   . Right wrist fracture 06/2015    PAST SURGICAL HISTORY: Past Surgical History:  Procedure Laterality Date  . PORTACATH PLACEMENT N/A 07/11/2016   Procedure: INSERTION PORT-A-CATH;  Surgeon: [place] Fellows, MD;  Location: [facility];  [name]: General;  Laterality: N/A;  . WISDOM TOOTH EXTRACTION  yrs ago    FAMILY HISTORY Family History    Problem Relation Age of Onset  . Arthritis Mother   . Hypertension Mother   . Heart disease Mother   . Dementia Mother   . Irritable bowel syndrome Mother   . Emphysema Father   . Cancer Father     bladder  . Cerebral aneurysm Father     ruptured aneurysm was cause of death  . Bipolar disorder Daughter     Not clear if this is the case.  Possibly Bipolar II  . Depression Daughter   . Graves' disease Sister   . Vitiligo Sister   . Mental illness Brother     Depression  . Mental illness Sister     likely undiagnosed schizophrenia  . Mental illness Brother     Schizophrenia  The patient's father died from a ruptured brain aneurysm at the age of 20. He also had a history of bladder cancer. He was a smoker. The patient's mother is living, age 42 as of July 2017. The patient had 2 brothers, 2 sisters. There is no history of breast or ovarian cancer in the family.  GYNECOLOGIC HISTORY:  No LMP recorded. Patient is postmenopausal. Menarche age 38, first live birth age 80, the patient understands increases the risk of breast cancer. The patient stopped having menses June 2012. She did not use hormone replacement. She didn't take oral contraceptives for approximately 9 years remotely, with no complications.  SOCIAL HISTORY:  [name] lives with her mother. She tells me she is the primary caregiver to her mother with [name] disease. The patient is not employed. The patient's husband [name] generally lives in [place] with his parents. He sometimes stays with the patient. She tells me he is a felon and this makes it hard for him to find a job. The patient's daughter, [place] [name], lives in [name] where she works as a IT sales professional for [name]. The patient has no grandchildren. She is a Control and instrumentation engineer.    ADVANCED DIRECTIVES: Not in place. At the 06/28/2016 visit the patient was given advanced directives to complete and notarize at her discretion.Marland Kitchen  HEALTH MAINTENANCE: Social History   Substance Use Topics  . Smoking status: Former Smoker    Packs/day: 0.25    Years: 15.00    Types: Cigarettes    Quit date: 01/21/1994  . Smokeless tobacco: Never Used  . Alcohol use 1.2 - 2.4 oz/week    2 - 4 Standard drinks or equivalent per week     Comment: Couple beers or wine a week.     Colonoscopy:  PAP:  Bone density:   Allergies  Allergen Reactions  . Hydrocodone Nausea Only and Other (See Comments)    dizziness  . Ultram [Tramadol Hcl] Nausea Only  . Gabapentin Rash    Current Outpatient Prescriptions  Medication Sig Dispense Refill  . acyclovir (ZOVIRAX) 400 MG tablet 1 tab by mouth twice daily 60 tablet 11  . desloratadine (CLARINEX) 5 MG tablet Take 1 tablet (5 mg total) by mouth daily. (Patient taking differently: Take 5 mg by mouth every evening. ) 30 tablet 11  . dexamethasone (DECADRON) 4 MG tablet Take 2 tablets by mouth once a day on the day after chemotherapy and then take 2 tablets two  times a day for 2 days. Take with food. 30 tablet 1  . DULoxetine (CYMBALTA) 30 MG capsule 1 cap by mouth with 60  mg cap once daily for total of 90 mg daily (Patient taking differently: Take 30 mg by mouth See admin instructions. Take 1 capsule by mouth daily along with 60  mg cap once daily for total of 90 mg daily) 30 capsule 11  . DULoxetine (CYMBALTA) 60 MG capsule Take 1 capsule (60 mg total) by mouth daily. Take along with '30mg'$  capsule for a total of '90mg'$  of cymbalta per day. 30 capsule 11  . ibuprofen (ADVIL,MOTRIN) 600 MG tablet Take 1 tablet (600 mg total) by mouth every 6 (six) hours as needed. 30 tablet 0  . ibuprofen (ADVIL,MOTRIN) 800 MG tablet Take 1 tablet (800 mg total) by mouth every 8 (eight) hours as needed. 30 tablet 0  . lidocaine-prilocaine (EMLA) cream Apply 1 application topically as needed. 30 g 0  . LORazepam (ATIVAN) 0.5 MG tablet Take 1 tablet (0.5 mg total) by mouth at bedtime as needed (Nausea or vomiting). 30 tablet 0  . mometasone (NASONEX) 50  MCG/ACT nasal spray 2 sprays each nostril daily 17 g 11  . omeprazole (PRILOSEC) 40 MG capsule Take 1 capsule (40 mg total) by mouth daily. 60 capsule 4  . prochlorperazine (COMPAZINE) 10 MG tablet Take 1 tablet (10 mg total) by mouth every 6 (six) hours as needed (Nausea or vomiting). 30 tablet 1  . SUMAtriptan (IMITREX) 50 MG tablet Take 1 tablet (50 mg total) by mouth every 2 (two) hours as needed for migraine. May repeat in 2 hours if headache persists or recurs. 10 tablet 0   No current facility-administered medications for this visit.     OBJECTIVE: Middle-aged white woman Who appears stated age 65:   07/24/16 1251  BP: (!) 131/94  Pulse: 94  Resp: 18  Temp: 98.5 F (36.9 C)     Body mass index is 38.33 kg/m.    ECOG FS:1 - Symptomatic but completely ambulatory Filed Weights   07/24/16 1251  Weight: 223 lb 4.8 oz (101.3 kg)   Sclerae unicteric, EOMs intact Oropharynx clear and moist No cervical or supraclavicular adenopathy Lungs no rales or rhonchi Heart regular rate and rhythm Abd soft, nontender, positive bowel sounds MSK no focal spinal tenderness, no upper extremity lymphedema Neuro: nonfocal, well oriented, appropriate affect Breasts: Deferred    LAB RESULTS:  CMP     Component Value Date/Time   NA 140 07/17/2016 1302   K 3.8 07/17/2016 1302   CL 108 07/14/2015 0544   CO2 24 07/17/2016 1302   GLUCOSE 84 07/17/2016 1302   BUN 13.8 07/17/2016 1302   CREATININE 0.8 07/17/2016 1302   CALCIUM 9.1 07/17/2016 1302   PROT 6.9 07/17/2016 1302   ALBUMIN 3.3 (L) 07/17/2016 1302   AST 18 07/17/2016 1302   ALT 17 07/17/2016 1302   ALKPHOS 107 07/17/2016 1302   BILITOT 0.31 07/17/2016 1302   GFRNONAA >60 07/14/2015 0544   GFRAA >60 07/14/2015 0544    INo results found for: SPEP, UPEP  Lab Results  Component Value Date   WBC 4.2 07/17/2016   NEUTROABS 2.9 07/17/2016   HGB 13.1 07/17/2016   HCT 40.2 07/17/2016   MCV 96.0 07/17/2016   PLT 246  07/17/2016      Chemistry      Component Value Date/Time   NA 140 07/17/2016 1302   K 3.8 07/17/2016 1302   CL  108 07/14/2015 0544   CO2 24 07/17/2016 1302   BUN 13.8 07/17/2016 1302   CREATININE 0.8 07/17/2016 1302      Component Value Date/Time   CALCIUM 9.1 07/17/2016 1302   ALKPHOS 107 07/17/2016 1302   AST 18 07/17/2016 1302   ALT 17 07/17/2016 1302   BILITOT 0.31 07/17/2016 1302       No results found for: LABCA2  No components found for: LABCA125  No results for input(s): INR in the last 168 hours.  Urinalysis    Component Value Date/Time   BILIRUBINUR negative 07/02/2012 1729   PROTEINUR negative 07/02/2012 1729   UROBILINOGEN 0.2 07/02/2012 1729   NITRITE negative 07/02/2012 1729   LEUKOCYTESUR Negative 07/02/2012 1729     STUDIES: Nm Bone Scan Whole Body  Result Date: 07/11/2016 CLINICAL DATA:  RIGHT breast cancer, RIGHT shoulder pain EXAM: NUCLEAR MEDICINE WHOLE BODY BONE SCAN TECHNIQUE: Whole body anterior and posterior images were obtained approximately 3 hours after intravenous injection of radiopharmaceutical. RADIOPHARMACEUTICALS:  26.5 mCi Technetium-3mMDP IV COMPARISON:  None Radiographic correlation: CT abdomen and pelvis 07/11/2016, BILATERAL ankle radiographs 07/13/2015 FINDINGS: Minimal uptake at the shoulders, knees and feet typically degenerative. Increased uptake at lateral LEFT ankle corresponding to oblique distal fibular fracture on prior radiographs No definite additional sites of abnormal osseous tracer accumulation identified to suggest metastatic disease. Expected urinary tract and soft tissue distribution of tracer. IMPRESSION: No scintigraphic evidence of osseous metastatic disease. Electronically Signed   By: MLavonia DanaM.D.   On: 07/11/2016 14:49   Ct Abdomen Pelvis W Contrast  Result Date: 07/11/2016 CLINICAL DATA:  55year old female with newly diagnosed right breast cancer. Staging. EXAM: CT ABDOMEN AND PELVIS WITH CONTRAST  TECHNIQUE: Multidetector CT imaging of the abdomen and pelvis was performed using the standard protocol following bolus administration of intravenous contrast. CONTRAST:  781mISOVUE-300 IOPAMIDOL (ISOVUE-300) INJECTION 61% COMPARISON:  None. FINDINGS: Lower chest: Solitary 4 mm solid right lower lobe pulmonary nodule (series 4/image 12). Hepatobiliary: Normal liver size. There are at least 4 tiny hypodense liver lesions scattered throughout the liver, largest 0.4 cm in the right liver lobe (series 602/image 68). Normal gallbladder with no radiopaque cholelithiasis. No biliary ductal dilatation. Pancreas: Normal, with no mass or duct dilation. Spleen: Normal size. No mass. Adrenals/Urinary Tract: Normal adrenals. Normal kidneys with no hydronephrosis and no renal mass. Normal bladder. Stomach/Bowel: Grossly normal stomach. Normal caliber small bowel with no small bowel wall thickening. Normal appendix. Normal large bowel with no diverticulosis, large bowel wall thickening or pericolonic fat stranding. Vascular/Lymphatic: Normal caliber abdominal aorta. Patent portal, splenic, hepatic and renal veins. No pathologically enlarged lymph nodes in the abdomen or pelvis. Reproductive: Grossly normal uterus.  No adnexal mass. Other: No pneumoperitoneum, ascites or focal fluid collection. Musculoskeletal: No aggressive appearing focal osseous lesions. Subcentimeter sclerotic lesion in the left anterior acetabulum is nonspecific and most likely a benign bone island. Mild thoracolumbar spondylosis. IMPRESSION: 1. Solitary 4 mm right lower lobe solid pulmonary nodule, indeterminate. Consider a dedicated chest CT for complete chest staging. Otherwise, a follow-up chest CT is advised in 3 months. 2. No definite metastatic disease in the abdomen or pelvis. 3. Tiny low-attenuation liver lesions measuring up to 0.4 cm, too small to characterize. A follow-up MRI abdomen without and with IV contrast or CT abdomen with IV contrast is  recommended in 6 months. This recommendation follows ACR consensus guidelines: Managing Incidental Findings on Abdominal CT: White Paper of the ACR Incidental Findings Committee. J  Am Coll Radiol 2010;7:754-773. Electronically Signed   By: Ilona Sorrel M.D.   On: 07/11/2016 14:04   Dg Chest Port 1 View  Result Date: 07/11/2016 CLINICAL DATA:  Port-A-Cath insertion. EXAM: PORTABLE CHEST 1 VIEW COMPARISON:  04/17/2015 FINDINGS: Left anterior chest wall Port-A-Cath extends through the left subclavian vein. Tip lies in the mid superior vena cava. No pneumothorax. Lungs are clear.  Heart, mediastinum and hila are unremarkable. IMPRESSION: 1. Port-A-Cath tip projects in the mid superior vena cava. No pneumothorax. 2. No acute cardiopulmonary disease. Electronically Signed   By: Lajean Manes M.D.   On: 07/11/2016 17:30   Dg C-arm 1-60 Min-no Report  Result Date: 07/11/2016 CLINICAL DATA: surgery C-ARM 1-60 MINUTES Fluoroscopy was utilized by the requesting physician.  No radiographic interpretation.    ELIGIBLE FOR AVAILABLE RESEARCH PROTOCOL: [name], Alliance  ASSESSMENT: 56 y.o. [place] woman status post right breast upper outer quadrant and right axillary lymph node biopsy 06/21/2016, both positive for a clinical T2 N2,stage IIIA  invasive ductal carcinoma, grade 3, estrogen and progesterone receptor positive, HER-2 nonamplified, with an MIB-1 between 20 and 25%   (1) neoadjuvant chemotherapy to consist of doxorubicin and cyclophosphamide in dose dense fashion 4, starting 07/17/2016, followed by weekly paclitaxel 12  (2) definitive surgery to follow chemotherapy, with consideration of targeted axillary dissection versus participation in the Alliance trial  (3) adjuvant radiation to follow surgery  (4) anti-estrogens to follow at the completion of local treatment   PLAN: Wyvonne did moderately well with her first cycle of cyclophosphamide and doxorubicin, but we need to do better.  She is  having more reflux. Doubtless this is due to the steroids. She is going to start omeprazole 40 mg daily (not when necessary).  She had minimal mouth sores. She is on acyclovir, which she will continue. She is having more migraines. I wrote her for Imitrex and explained how to take that medication. I also refilled her 800 mg ibuprofen tablets.  Finally I encouraged her to try to walk at least 30 minutes daily. She can break it up into 3 separate 10 minute walks or just do it on a once. The point is such as lying on the couch and resting is not going to get rid of her fatigue, but actually make it worse.  She has not yet begun to lose her hair, but that is likely to happen within the next 5-10 days and it is always shocking. She is prepared and has awakened place  She will return for cycle 2 next week. She knows to call for any problems that may develop before then.  Chauncey Cruel, MD   07/24/2016 1:11 PM Medical Oncology and Hematology [facility] [address] [name], [place] [address] Tel. [phone]    Fax. [phone]

## 2016-07-24 ENCOUNTER — Ambulatory Visit (HOSPITAL_BASED_OUTPATIENT_CLINIC_OR_DEPARTMENT_OTHER): Payer: Medicaid Other

## 2016-07-24 ENCOUNTER — Telehealth: Payer: Self-pay | Admitting: *Deleted

## 2016-07-24 ENCOUNTER — Other Ambulatory Visit (HOSPITAL_BASED_OUTPATIENT_CLINIC_OR_DEPARTMENT_OTHER): Payer: Medicaid Other

## 2016-07-24 ENCOUNTER — Inpatient Hospital Stay (HOSPITAL_COMMUNITY): Admission: RE | Admit: 2016-07-24 | Payer: No Typology Code available for payment source | Source: Ambulatory Visit

## 2016-07-24 ENCOUNTER — Other Ambulatory Visit: Payer: Self-pay | Admitting: Oncology

## 2016-07-24 ENCOUNTER — Ambulatory Visit (HOSPITAL_BASED_OUTPATIENT_CLINIC_OR_DEPARTMENT_OTHER): Payer: Medicaid Other | Admitting: Oncology

## 2016-07-24 VITALS — BP 131/94 | HR 94 | Temp 98.5°F | Resp 18 | Ht 64.0 in | Wt 223.3 lb

## 2016-07-24 DIAGNOSIS — C50919 Malignant neoplasm of unspecified site of unspecified female breast: Secondary | ICD-10-CM

## 2016-07-24 DIAGNOSIS — C773 Secondary and unspecified malignant neoplasm of axilla and upper limb lymph nodes: Principal | ICD-10-CM

## 2016-07-24 DIAGNOSIS — C50411 Malignant neoplasm of upper-outer quadrant of right female breast: Secondary | ICD-10-CM

## 2016-07-24 DIAGNOSIS — C50911 Malignant neoplasm of unspecified site of right female breast: Secondary | ICD-10-CM

## 2016-07-24 LAB — COMPREHENSIVE METABOLIC PANEL
ALT: 13 U/L (ref 0–55)
AST: 9 U/L (ref 5–34)
Albumin: 3.3 g/dL — ABNORMAL LOW (ref 3.5–5.0)
Alkaline Phosphatase: 107 U/L (ref 40–150)
Anion Gap: 8 mEq/L (ref 3–11)
BUN: 10.2 mg/dL (ref 7.0–26.0)
CALCIUM: 9.5 mg/dL (ref 8.4–10.4)
CHLORIDE: 102 meq/L (ref 98–109)
CO2: 24 mEq/L (ref 22–29)
Creatinine: 0.7 mg/dL (ref 0.6–1.1)
EGFR: >90 mL/min/{1.73_m2} (ref >90)
Glucose: 93 mg/dl (ref 70–140)
POTASSIUM: 3.9 meq/L (ref 3.5–5.1)
Sodium: 135 mEq/L — ABNORMAL LOW (ref 136–145)
Total Bilirubin: 0.67 mg/dL (ref 0.20–1.20)
Total Protein: 7.1 g/dL (ref 6.4–8.3)

## 2016-07-24 LAB — CBC WITH DIFFERENTIAL/PLATELET
BASO%: 0 % (ref 0.0–2.0)
BASOS ABS: 0 10*3/uL (ref 0.0–0.1)
EOS%: 17.2 % — AB (ref 0.0–7.0)
Eosinophils Absolute: 0.1 10*3/uL (ref 0.0–0.5)
HEMATOCRIT: 41.7 % (ref 34.8–46.6)
HGB: 13.8 g/dL (ref 11.6–15.9)
LYMPH%: 78.4 % — AB (ref 14.0–49.7)
MCH: 31.3 pg (ref 25.1–34.0)
MCHC: 33 g/dL (ref 31.5–36.0)
MCV: 94.8 fL (ref 79.5–101.0)
MONO#: 0 10*3/uL — ABNORMAL LOW (ref 0.1–0.9)
MONO%: 3.1 % (ref 0.0–14.0)
NEUT#: 0 10*3/uL — CL (ref 1.5–6.5)
NEUT%: 1.3 % — AB (ref 38.4–76.8)
Platelets: 91 10*3/uL — ABNORMAL LOW (ref 145–400)
RBC: 4.4 10*6/uL (ref 3.70–5.45)
RDW: 13.8 % (ref 11.2–14.5)
WBC: 0.3 10*3/uL — AB (ref 3.9–10.3)
lymph#: 0.3 10*3/uL — ABNORMAL LOW (ref 0.9–3.3)

## 2016-07-24 MED ORDER — SODIUM CHLORIDE 0.9% FLUSH
10.0000 mL | INTRAVENOUS | Status: DC | PRN
  Administered 2016-07-24: 10 mL via INTRAVENOUS
  Filled 2016-07-24: qty 10

## 2016-07-24 MED ORDER — OMEPRAZOLE 40 MG PO CPDR: 40.0000 mg | DELAYED_RELEASE_CAPSULE | Freq: Every day | ORAL | 4 refills | Status: DC

## 2016-07-24 MED ORDER — SUMATRIPTAN SUCCINATE 50 MG PO TABS: 50.0000 mg | ORAL_TABLET | ORAL | 0 refills | Status: DC | PRN

## 2016-07-24 MED ORDER — IBUPROFEN 800 MG PO TABS
800.0000 mg | ORAL_TABLET | Freq: Three times a day (TID) | ORAL | 0 refills | Status: DC | PRN
Start: 2016-07-24 — End: 2016-10-16

## 2016-07-24 MED ORDER — CIPROFLOXACIN HCL 500 MG PO TABS: 500.0000 mg | ORAL_TABLET | Freq: Two times a day (BID) | ORAL | 4 refills | Status: DC

## 2016-07-24 MED ORDER — HEPARIN SOD (PORK) LOCK FLUSH 100 UNIT/ML IV SOLN
500.0000 [IU] | Freq: Once | INTRAVENOUS | Status: AC
  Administered 2016-07-24: 500 [IU] via INTRAVENOUS
  Filled 2016-07-24: qty 5

## 2016-07-24 NOTE — Telephone Encounter (Signed)
Attempted to call pt at mobile # but unable to reach pt.  Tried pt's daughter & husband's #'s & left messages for pt to call back to inform that Dr Jana Hakim sent in script for cipro d/t WBC/ANC low.   Faxed scripts fro prilosec & imitrex to [facility] Dept. @ [phone]

## 2016-07-24 NOTE — Progress Notes (Signed)
Her ANC is 0.0. I am starting her on Cipro prophylactically for 5 days.

## 2016-07-24 NOTE — Telephone Encounter (Signed)
2nd attempt to call pt & unable to reach.  Left message at spouses # to call back in am.  Pt will go to MRI tomorrow

## 2016-07-25 ENCOUNTER — Encounter (HOSPITAL_COMMUNITY): Payer: Self-pay | Admitting: *Deleted

## 2016-07-25 ENCOUNTER — Encounter: Payer: Self-pay | Admitting: *Deleted

## 2016-07-25 ENCOUNTER — Inpatient Hospital Stay (HOSPITAL_COMMUNITY)
Admission: EM | Admit: 2016-07-25 | Discharge: 2016-07-27 | DRG: 809 | Disposition: A | Discharge status: Home / Self Care | Payer: Medicaid Other | Attending: Internal Medicine | Admitting: Internal Medicine

## 2016-07-25 ENCOUNTER — Other Ambulatory Visit: Payer: Self-pay | Admitting: *Deleted

## 2016-07-25 ENCOUNTER — Ambulatory Visit (HOSPITAL_BASED_OUTPATIENT_CLINIC_OR_DEPARTMENT_OTHER): Payer: Medicaid Other

## 2016-07-25 ENCOUNTER — Emergency Department (HOSPITAL_COMMUNITY): Payer: Medicaid Other

## 2016-07-25 ENCOUNTER — Ambulatory Visit (HOSPITAL_BASED_OUTPATIENT_CLINIC_OR_DEPARTMENT_OTHER): Payer: Medicaid Other | Admitting: Nurse Practitioner

## 2016-07-25 ENCOUNTER — Ambulatory Visit (HOSPITAL_COMMUNITY): Admission: RE | Admit: 2016-07-25 | Payer: Self-pay | Source: Ambulatory Visit

## 2016-07-25 ENCOUNTER — Telehealth: Payer: Self-pay | Admitting: *Deleted

## 2016-07-25 VITALS — BP 138/77 | HR 100 | Temp 101.0°F | Resp 22 | Ht 64.0 in | Wt 224.2 lb

## 2016-07-25 DIAGNOSIS — K219 Gastro-esophageal reflux disease without esophagitis: Secondary | ICD-10-CM | POA: Diagnosis present

## 2016-07-25 DIAGNOSIS — L0231 Cutaneous abscess of buttock: Secondary | ICD-10-CM | POA: Diagnosis present

## 2016-07-25 DIAGNOSIS — E860 Dehydration: Secondary | ICD-10-CM | POA: Diagnosis not present

## 2016-07-25 DIAGNOSIS — R509 Fever, unspecified: Secondary | ICD-10-CM | POA: Diagnosis present

## 2016-07-25 DIAGNOSIS — R5081 Fever presenting with conditions classified elsewhere: Secondary | ICD-10-CM

## 2016-07-25 DIAGNOSIS — Z888 Allergy status to other drugs, medicaments and biological substances status: Secondary | ICD-10-CM | POA: Diagnosis not present

## 2016-07-25 DIAGNOSIS — G43919 Migraine, unspecified, intractable, without status migrainosus: Secondary | ICD-10-CM | POA: Diagnosis present

## 2016-07-25 DIAGNOSIS — L03317 Cellulitis of buttock: Secondary | ICD-10-CM | POA: Diagnosis present

## 2016-07-25 DIAGNOSIS — Z87891 Personal history of nicotine dependence: Secondary | ICD-10-CM

## 2016-07-25 DIAGNOSIS — Z885 Allergy status to narcotic agent status: Secondary | ICD-10-CM | POA: Diagnosis not present

## 2016-07-25 DIAGNOSIS — C50411 Malignant neoplasm of upper-outer quadrant of right female breast: Secondary | ICD-10-CM

## 2016-07-25 DIAGNOSIS — D709 Neutropenia, unspecified: Secondary | ICD-10-CM

## 2016-07-25 DIAGNOSIS — Z6839 Body mass index (BMI) 39.0-39.9, adult: Secondary | ICD-10-CM | POA: Diagnosis not present

## 2016-07-25 DIAGNOSIS — C773 Secondary and unspecified malignant neoplasm of axilla and upper limb lymph nodes: Secondary | ICD-10-CM | POA: Diagnosis not present

## 2016-07-25 DIAGNOSIS — D702 Other drug-induced agranulocytosis: Secondary | ICD-10-CM

## 2016-07-25 DIAGNOSIS — C50919 Malignant neoplasm of unspecified site of unspecified female breast: Secondary | ICD-10-CM | POA: Diagnosis present

## 2016-07-25 DIAGNOSIS — R51 Headache: Secondary | ICD-10-CM | POA: Diagnosis not present

## 2016-07-25 DIAGNOSIS — E876 Hypokalemia: Secondary | ICD-10-CM | POA: Diagnosis not present

## 2016-07-25 DIAGNOSIS — R519 Headache, unspecified: Secondary | ICD-10-CM

## 2016-07-25 DIAGNOSIS — D6959 Other secondary thrombocytopenia: Secondary | ICD-10-CM | POA: Diagnosis present

## 2016-07-25 DIAGNOSIS — L0291 Cutaneous abscess, unspecified: Secondary | ICD-10-CM | POA: Diagnosis not present

## 2016-07-25 DIAGNOSIS — E669 Obesity, unspecified: Secondary | ICD-10-CM | POA: Diagnosis present

## 2016-07-25 DIAGNOSIS — C50911 Malignant neoplasm of unspecified site of right female breast: Secondary | ICD-10-CM

## 2016-07-25 DIAGNOSIS — Z79899 Other long term (current) drug therapy: Secondary | ICD-10-CM | POA: Diagnosis not present

## 2016-07-25 DIAGNOSIS — G43909 Migraine, unspecified, not intractable, without status migrainosus: Secondary | ICD-10-CM | POA: Diagnosis present

## 2016-07-25 DIAGNOSIS — T451X5A Adverse effect of antineoplastic and immunosuppressive drugs, initial encounter: Secondary | ICD-10-CM | POA: Diagnosis present

## 2016-07-25 DIAGNOSIS — Z170 Estrogen receptor positive status [ER+]: Secondary | ICD-10-CM

## 2016-07-25 DIAGNOSIS — D696 Thrombocytopenia, unspecified: Secondary | ICD-10-CM

## 2016-07-25 DIAGNOSIS — Z7952 Long term (current) use of systemic steroids: Secondary | ICD-10-CM

## 2016-07-25 DIAGNOSIS — L0390 Cellulitis, unspecified: Secondary | ICD-10-CM | POA: Diagnosis not present

## 2016-07-25 DIAGNOSIS — Z8742 Personal history of other diseases of the female genital tract: Secondary | ICD-10-CM

## 2016-07-25 DIAGNOSIS — K1231 Oral mucositis (ulcerative) due to antineoplastic therapy: Secondary | ICD-10-CM

## 2016-07-25 LAB — CBC WITH DIFFERENTIAL/PLATELET
BAND NEUTROPHILS: 0 %
Basophils Absolute: 0 10*3/uL (ref 0.0–0.1)
Basophils Relative: 0 %
EOS ABS: 0 10*3/uL (ref 0.0–0.7)
EOS PCT: 0 %
HEMATOCRIT: 36.2 % (ref 36.0–46.0)
HEMOGLOBIN: 12.6 g/dL (ref 12.0–15.0)
LYMPHS PCT: 0 %
Lymphs Abs: 0 10*3/uL — ABNORMAL LOW (ref 0.7–4.0)
MCH: 32.2 pg (ref 26.0–34.0)
MCHC: 34.8 g/dL (ref 30.0–36.0)
MCV: 92.6 fL (ref 78.0–100.0)
MONOS PCT: 0 %
Monocytes Absolute: 0 10*3/uL — ABNORMAL LOW (ref 0.1–1.0)
NRBC: 0 /100{WBCs}
Neutrophils Relative %: 0 %
Platelets: 75 10*3/uL — ABNORMAL LOW (ref 150–400)
RBC: 3.91 MIL/uL (ref 3.87–5.11)
RDW: 13.3 % (ref 11.5–15.5)
WBC: 0.1 10*3/uL — CL (ref 4.0–10.5)

## 2016-07-25 LAB — BASIC METABOLIC PANEL
ANION GAP: 8 (ref 5–15)
BUN: 12 mg/dL (ref 6–20)
CHLORIDE: 102 mmol/L (ref 101–111)
CO2: 22 mmol/L (ref 22–32)
Calcium: 8.7 mg/dL — ABNORMAL LOW (ref 8.9–10.3)
Creatinine, Ser: 0.63 mg/dL (ref 0.44–1.00)
GFR calc Af Amer: >60 mL/min (ref >60)
GFR calc non Af Amer: >60 mL/min (ref >60)
GLUCOSE: 106 mg/dL — AB (ref 65–99)
POTASSIUM: 3.6 mmol/L (ref 3.5–5.1)
Sodium: 132 mmol/L — ABNORMAL LOW (ref 135–145)

## 2016-07-25 LAB — URINALYSIS, MICROSCOPIC - CHCC
BILIRUBIN (URINE): NEGATIVE
BLOOD: NEGATIVE
Glucose: NEGATIVE mg/dL
KETONES: NEGATIVE mg/dL
Leukocyte Esterase: NEGATIVE
Nitrite: NEGATIVE
PH: 6 (ref 4.6–8.0)
PROTEIN: NEGATIVE mg/dL
RBC / HPF: NEGATIVE (ref 0–2)
SPECIFIC GRAVITY, URINE: 1.005 (ref 1.003–1.035)
Urobilinogen, UR: 0.2 mg/dL (ref 0.2–1)

## 2016-07-25 LAB — URINALYSIS, ROUTINE W REFLEX MICROSCOPIC
Bilirubin Urine: NEGATIVE
GLUCOSE, UA: NEGATIVE mg/dL
Hgb urine dipstick: NEGATIVE
KETONES UR: NEGATIVE mg/dL
LEUKOCYTES UA: NEGATIVE
Nitrite: NEGATIVE
PH: 5.5 (ref 5.0–8.0)
Protein, ur: NEGATIVE mg/dL
SPECIFIC GRAVITY, URINE: 1.009 (ref 1.005–1.030)

## 2016-07-25 LAB — I-STAT CG4 LACTIC ACID, ED
Lactic Acid, Venous: 0.88 mmol/L (ref 0.5–1.9)
Lactic Acid, Venous: 0.87 mmol/L (ref 0.5–1.9)

## 2016-07-25 LAB — RAPID STREP SCREEN (MED CTR MEBANE ONLY): Streptococcus, Group A Screen (Direct): NEGATIVE

## 2016-07-25 MED ORDER — SODIUM CHLORIDE 0.9 % IV BOLUS (SEPSIS)
1000.0000 mL | Freq: Once | INTRAVENOUS | Status: AC
  Administered 2016-07-25: 1000 mL via INTRAVENOUS

## 2016-07-25 MED ORDER — DEXTROSE 5 % IV SOLN
2.0000 g | Freq: Three times a day (TID) | INTRAVENOUS | Status: DC
  Administered 2016-07-26 – 2016-07-27 (×5): 2 g via INTRAVENOUS
  Filled 2016-07-25 (×6): qty 2

## 2016-07-25 MED ORDER — DULOXETINE HCL 60 MG PO CPEP: 60.0000 mg | ORAL_CAPSULE | Freq: Every day | ORAL | Status: DC

## 2016-07-25 MED ORDER — OXYCODONE-ACETAMINOPHEN 5-325 MG PO TABS
ORAL_TABLET | ORAL | Status: AC
  Filled 2016-07-25: qty 2

## 2016-07-25 MED ORDER — DEXTROSE 5 % IV SOLN
2.0000 g | Freq: Once | INTRAVENOUS | Status: AC
  Administered 2016-07-25: 2 g via INTRAVENOUS
  Filled 2016-07-25: qty 2

## 2016-07-25 MED ORDER — LORAZEPAM 0.5 MG PO TABS: 0.5000 mg | ORAL_TABLET | Freq: Every evening | ORAL | Status: DC | PRN

## 2016-07-25 MED ORDER — CIPROFLOXACIN HCL 500 MG PO TABS: 500.0000 mg | ORAL_TABLET | Freq: Two times a day (BID) | ORAL | 4 refills | Status: DC

## 2016-07-25 MED ORDER — IBUPROFEN 800 MG PO TABS
800.0000 mg | ORAL_TABLET | Freq: Three times a day (TID) | ORAL | Status: DC | PRN
  Administered 2016-07-25 – 2016-07-27 (×4): 800 mg via ORAL
  Filled 2016-07-25 (×4): qty 1

## 2016-07-25 MED ORDER — OMEPRAZOLE 40 MG PO CPDR: 40.0000 mg | DELAYED_RELEASE_CAPSULE | Freq: Every day | ORAL | 4 refills | Status: DC

## 2016-07-25 MED ORDER — DIPHENHYDRAMINE HCL 50 MG/ML IJ SOLN
12.5000 mg | Freq: Once | INTRAMUSCULAR | Status: AC
  Administered 2016-07-25: 12.5 mg via INTRAVENOUS
  Filled 2016-07-25: qty 1

## 2016-07-25 MED ORDER — PROCHLORPERAZINE MALEATE 10 MG PO TABS: 10.0000 mg | ORAL_TABLET | Freq: Four times a day (QID) | ORAL | Status: DC | PRN

## 2016-07-25 MED ORDER — ACETAMINOPHEN 325 MG PO TABS
ORAL_TABLET | ORAL | Status: AC
  Filled 2016-07-25: qty 2

## 2016-07-25 MED ORDER — DULOXETINE HCL 30 MG PO CPEP
90.0000 mg | ORAL_CAPSULE | Freq: Every day | ORAL | Status: DC
  Administered 2016-07-26 – 2016-07-27 (×2): 90 mg via ORAL
  Filled 2016-07-25 (×2): qty 3

## 2016-07-25 MED ORDER — LORATADINE 10 MG PO TABS
10.0000 mg | ORAL_TABLET | Freq: Every day | ORAL | Status: DC
  Administered 2016-07-25 – 2016-07-27 (×3): 10 mg via ORAL
  Filled 2016-07-25 (×3): qty 1

## 2016-07-25 MED ORDER — ACETAMINOPHEN 325 MG PO TABS
650.0000 mg | ORAL_TABLET | Freq: Once | ORAL | Status: AC
  Administered 2016-07-25: 650 mg via ORAL

## 2016-07-25 MED ORDER — SUMATRIPTAN SUCCINATE 50 MG PO TABS: 50.0000 mg | ORAL_TABLET | ORAL | 0 refills | Status: DC | PRN

## 2016-07-25 MED ORDER — METOCLOPRAMIDE HCL 5 MG/ML IJ SOLN
10.0000 mg | Freq: Once | INTRAMUSCULAR | Status: AC
  Administered 2016-07-25: 10 mg via INTRAVENOUS
  Filled 2016-07-25: qty 2

## 2016-07-25 MED ORDER — ACYCLOVIR 400 MG PO TABS
400.0000 mg | ORAL_TABLET | Freq: Two times a day (BID) | ORAL | Status: DC
  Administered 2016-07-25 – 2016-07-27 (×4): 400 mg via ORAL
  Filled 2016-07-25 (×4): qty 1

## 2016-07-25 MED ORDER — PANTOPRAZOLE SODIUM 40 MG PO TBEC
80.0000 mg | DELAYED_RELEASE_TABLET | Freq: Every day | ORAL | Status: DC
  Administered 2016-07-25 – 2016-07-27 (×3): 80 mg via ORAL
  Filled 2016-07-25 (×3): qty 2

## 2016-07-25 NOTE — ED Triage Notes (Signed)
Per [place] nurse - patient comes from [facility] where she came in because she had a fever of 100.3 at home and 101 at [facility]. Her last chemo was on 8/14.  Cancer center docs did blood cultures x2, UA and culture, CBC and CMP.  Patient's CBC indicates WBC 0.3 and neutrophils 0.0, Patient also c/o migraine headache with hx of same.  Patient denies N/V.

## 2016-07-25 NOTE — H&P (Signed)
History and Physical    April [name] RFF:[id number] DOB: [birth year] DOA: 07/25/2016   PCP: [name], MD Chief Complaint:  Chief Complaint  Patient presents with  . Neutropenia    HPI: April [name] is a 55 y.o. female with medical history significant of R BRCA, chemo on 8/14, took Neulasta on 8/15.  Patient presents to ED with c/o fever (measured at 101.0 here in ED).  BCx drawn at cancer center today before coming to ED.  WBC was 0.3 with ANC 0 yesterday, today is 0.1 with ANC 0.  Patient reports associated symptoms of severe sore throat, headache, does have h/o migraines.  No CP, SOB, cough, N/V/D, rashes.  ED Course: Strep test negative.  Review of Systems: As per HPI otherwise 10 point review of systems negative.    Past Medical History:  Diagnosis Date  . Allergy   . Anxiety   . Arthritis    knees  . Bilateral ankle fractures 07/2015   Booted  . Cancer [facility]) dx June 22, 2016   right breast  . Depression    Multiple  episodes  in past.  . Fibromyalgia 2013   diagnosed by Dr. [name]  . Genital herpes 2005   Has outbreaks monthly  . GERD (gastroesophageal reflux disease)   . Migraine   . Obesity   . Right wrist fracture 06/2015    Past Surgical History:  Procedure Laterality Date  . PORTACATH PLACEMENT N/A 07/11/2016   Procedure: INSERTION PORT-A-CATH;  Surgeon: Excell Seltzer, MD;  Location: [facility];  Service: General;  Laterality: N/A;  . WISDOM TOOTH EXTRACTION  yrs ago     reports that she quit smoking about 22 years ago. Her smoking use included Cigarettes. She has a 3.75 pack-year smoking history. She has never used smokeless tobacco. She reports that she drinks about 1.2 - 2.4 oz of alcohol per week . She reports that she uses drugs, including Marijuana.  Allergies  Allergen Reactions  . Hydrocodone Nausea Only and Other (See Comments)    dizziness  . Ultram [Tramadol Hcl] Nausea Only  . Gabapentin Rash    Family History  Problem  Relation Age of Onset  . Arthritis Mother   . Hypertension Mother   . Heart disease Mother   . Dementia Mother   . Irritable bowel syndrome Mother   . Emphysema Father   . Cancer Father     bladder  . Cerebral aneurysm Father     ruptured aneurysm was cause of death  . Bipolar disorder Daughter     Not clear if this is the case.  Possibly Bipolar II  . Depression Daughter   . Graves' disease Sister   . Vitiligo Sister   . Mental illness Brother     Depression  . Mental illness Sister     likely undiagnosed schizophrenia  . Mental illness Brother     Schizophrenia      Prior to Admission medications   Medication Sig Start Date End Date Taking? Authorizing Provider  acyclovir (ZOVIRAX) 400 MG tablet 1 tab by mouth twice daily 01/10/16  Yes [name], MD  desloratadine (CLARINEX) 5 MG tablet Take 1 tablet (5 mg total) by mouth daily. Patient taking differently: Take 5 mg by mouth every evening.  02/02/16  Yes [name], MD  dexamethasone (DECADRON) 4 MG tablet Take 2 tablets by mouth once a day on the day after chemotherapy and then take 2 tablets two times a day for 2 days.  Take with food. 07/14/16  Yes Chauncey Cruel, MD  DULoxetine (CYMBALTA) 30 MG capsule 1 cap by mouth with 60  mg cap once daily for total of 90 mg daily Patient taking differently: Take 30 mg by mouth See admin instructions. Take 1 capsule by mouth daily along with 60  mg cap once daily for total of 90 mg daily 06/14/16  Yes [name], MD  DULoxetine (CYMBALTA) 60 MG capsule Take 1 capsule (60 mg total) by mouth daily. Take along with '30mg'$  capsule for a total of '90mg'$  of cymbalta per day. 06/27/16  Yes [name], MD  ibuprofen (ADVIL,MOTRIN) 800 MG tablet Take 1 tablet (800 mg total) by mouth every 8 (eight) hours as needed. 07/24/16  Yes Chauncey Cruel, MD  lidocaine-prilocaine (EMLA) cream Apply 1 application topically as needed. 07/14/16  Yes Chauncey Cruel, MD  LORazepam  (ATIVAN) 0.5 MG tablet Take 1 tablet (0.5 mg total) by mouth at bedtime as needed (Nausea or vomiting). 07/14/16  Yes Chauncey Cruel, MD  mometasone (NASONEX) 50 MCG/ACT nasal spray 2 sprays each nostril daily 02/02/16  Yes [name], MD  prochlorperazine (COMPAZINE) 10 MG tablet Take 1 tablet (10 mg total) by mouth every 6 (six) hours as needed (Nausea or vomiting). 07/14/16  Yes Chauncey Cruel, MD  omeprazole (PRILOSEC) 40 MG capsule Take 1 capsule (40 mg total) by mouth daily. 07/25/16   Susanne Borders, NP  SUMAtriptan (IMITREX) 50 MG tablet Take 1 tablet (50 mg total) by mouth every 2 (two) hours as needed for migraine. May repeat in 2 hours if headache persists or recurs. 07/25/16   Susanne Borders, NP    Physical Exam: Vitals:   07/25/16 1813 07/25/16 1920 07/25/16 2045  BP: 128/87 140/85 154/86  Pulse: 93 89 91  Resp: 20 20 (!) 27  Temp: 100.8 F (38.2 C) 99.5 F (37.5 C)   TempSrc: Oral Oral   SpO2: 98% 95% 98%      Constitutional: NAD, calm, comfortable Eyes: PERRL, lids and conjunctivae normal ENMT: Mucous membranes are moist. Posterior pharynx clear of any exudate or lesions.Normal dentition.  Neck: normal, supple, no masses, no thyromegaly Respiratory: clear to auscultation bilaterally, no wheezing, no crackles. Normal respiratory effort. No accessory muscle use.  Cardiovascular: Regular rate and rhythm, no murmurs / rubs / gallops. No extremity edema. 2+ pedal pulses. No carotid bruits.  Abdomen: no tenderness, no masses palpated. No hepatosplenomegaly. Bowel sounds positive.  Musculoskeletal: no clubbing / cyanosis. No joint deformity upper and lower extremities. Good ROM, no contractures. Normal muscle tone.  Skin: no rashes, lesions, ulcers. No induration Neurologic: CN 2-12 grossly intact. Sensation intact, DTR normal. Strength 5/5 in all 4.  Psychiatric: Normal judgment and insight. Alert and oriented x 3. Normal mood.    Labs on Admission: I have  personally reviewed following labs and imaging studies  CBC:  Recent Labs Lab 07/24/16 1224 07/25/16 1920  WBC 0.3* 0.1*  NEUTROABS 0.0*  --   HGB 13.8 12.6  HCT 41.7 36.2  MCV 94.8 92.6  PLT 91* 75*   Basic Metabolic Panel:  Recent Labs Lab 07/24/16 1224 07/25/16 1920  NA 135* 132*  K 3.9 3.6  CL  --  102  CO2 24 22  GLUCOSE 93 106*  BUN 10.2 12  CREATININE 0.7 0.63  CALCIUM 9.5 8.7*   GFR: Estimated Creatinine Clearance: 93.3 mL/min (by C-G formula based on SCr of 0.8 mg/dL). Liver Function Tests:  Recent  Labs Lab 07/24/16 1224  AST 9  ALT 13  ALKPHOS 107  BILITOT 0.67  PROT 7.1  ALBUMIN 3.3*   No results for input(s): LIPASE, AMYLASE in the last 168 hours. No results for input(s): AMMONIA in the last 168 hours. Coagulation Profile: No results for input(s): INR, PROTIME in the last 168 hours. Cardiac Enzymes: No results for input(s): CKTOTAL, CKMB, CKMBINDEX, TROPONINI in the last 168 hours. BNP (last 3 results) No results for input(s): PROBNP in the last 8760 hours. HbA1C: No results for input(s): HGBA1C in the last 72 hours. CBG: No results for input(s): GLUCAP in the last 168 hours. Lipid Profile: No results for input(s): CHOL, HDL, LDLCALC, TRIG, CHOLHDL, LDLDIRECT in the last 72 hours. Thyroid Function Tests: No results for input(s): TSH, T4TOTAL, FREET4, T3FREE, THYROIDAB in the last 72 hours. Anemia Panel: No results for input(s): VITAMINB12, FOLATE, FERRITIN, TIBC, IRON, RETICCTPCT in the last 72 hours. Urine analysis:    Component Value Date/Time   LABSPEC 1.005 07/25/2016 1643   PHURINE 6.0 07/25/2016 1643   GLUCOSEU Negative 07/25/2016 1643   HGBUR Negative 07/25/2016 1643   BILIRUBINUR Negative 07/25/2016 1643   KETONESUR Negative 07/25/2016 1643   PROTEINUR Negative 07/25/2016 1643   UROBILINOGEN 0.2 07/25/2016 1643   NITRITE Negative 07/25/2016 1643   LEUKOCYTESUR Negative 07/25/2016 1643   Sepsis  Labs: '@LABRCNTIP'$ (procalcitonin:4,lacticidven:4) ) Recent Results (from the past 240 hour(s))  Rapid strep screen     Status: None   Collection Time: 07/25/16  7:25 PM  Result Value Ref Range Status   Streptococcus, Group A Screen (Direct) NEGATIVE NEGATIVE Final    Comment: (NOTE) A Rapid Antigen test may result negative if the antigen level in the sample is below the detection level of this test. The FDA has not cleared this test as a stand-alone test therefore the rapid antigen negative result has reflexed to a Group A Strep culture.      Radiological Exams on Admission: Ct Head Wo Contrast  Result Date: 07/25/2016 CLINICAL DATA:  With patient with elevated temperature. Migraine headache. History a noted at the Breast Cancer. EXAM: CT HEAD WITHOUT CONTRAST TECHNIQUE: Contiguous axial images were obtained from the base of the skull through the vertex without intravenous contrast. COMPARISON:  None. FINDINGS: Brain: Ventricles and sulci are appropriate for patient's age. No evidence for acute cortically based infarct, intracranial hemorrhage, mass lesion or mass-effect. Vascular: Unremarkable Skull: Intact. Sinuses/Orbits: Mastoid air cells unremarkable. Paranasal sinuses unremarkable. Other: None. IMPRESSION: No acute intracranial process. Electronically Signed   By: Lovey Newcomer M.D.   On: 07/25/2016 20:51    EKG: Independently reviewed.  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 Febrile neutropenia - 1. Cefepime 2. BCx, apparently already drawn at cancer center 3. UA and UCx 4. Strep culture pending (rapid strep negative). 2. BRCA - on chemo, last was 8/14, left consult order in EPIC 3. Thrombocytopenia - from BMS from chemo, trend 4. H/o Herpes- continue acyclovir PO   DVT  prophylaxis: SCDs only due to thrombocytopenia Code Status: Full Family Communication: No family in room Consults called: Left consult order in EPIC for oncology Admission status: Admit to inpatient   Etta Quill DO [facility] Pager [phone] from 7PM-7AM  If 7AM-7PM, please contact the day physician for the patient [email or web address] Password  [facility]  07/25/2016, 9:38 PM

## 2016-07-25 NOTE — Telephone Encounter (Signed)
This RN contacted the [facility] and was informed pt has not picked up prescription - of note prescription at this facility is " held " until pt calls stating need to pick up.  This RN resent the Cipro prescription to the [facility] out pt pharmacy and alerted MRI of need for pt to pick up due to neutropenia.  MRI will alert the pt.

## 2016-07-25 NOTE — ED Notes (Signed)
Pt being sent by [facility].  C/o fever(100.3) x 1 day and migraine x "a couple days."  Hx of breast CA and migraines.  First and last chemo 8/14.  +light sensitivity  Pt is neutropenic.     Blood work, including cultures completed at [facility].

## 2016-07-25 NOTE — Progress Notes (Signed)
Pharmacy Antibiotic Note  April [name] is a 55 y.o. female admitted on 07/25/2016 with febrile neutropenia.  Pharmacy has been consulted for cefepime dosing.  Plan: Cefepime 2gm IV q8h (1st dose given in ED) Follow renal function, cultures, clinical course     Temp (24hrs), Avg:100.4 F (38 C), Min:99.5 F (37.5 C), Max:101 F (38.3 C)   Recent Labs Lab 07/24/16 1224 07/25/16 1834 07/25/16 1920  WBC 0.3*  --  0.1*  CREATININE 0.7  --  0.63  LATICACIDVEN  --  0.88  --     Estimated Creatinine Clearance: 93.3 mL/min (by C-G formula based on SCr of 0.8 mg/dL).    Allergies  Allergen Reactions  . Hydrocodone Nausea Only and Other (See Comments)    dizziness  . Ultram [Tramadol Hcl] Nausea Only  . Gabapentin Rash    Antimicrobials this admission: 8/22 cefepime >>   Microbiology results: 8/22 BCx:  8/22 UCx:  8/22 rapid strep: negative  Thank you for allowing pharmacy to be a part of this patient's care.  Dolly Rias RPh 07/25/2016, 9:26 PM Pager [phone]

## 2016-07-25 NOTE — Telephone Encounter (Signed)
Voicemail from patient reporting "Last night fever of 100.1, headache like a migraine.  Despite taking all the ibuprofen she can temp = 99.5 and headache remains.  Mouth hurts, gums sore with red ulcers all on my tongue which came up overnight.  I told Dr. Jana Hakim about a sore in my nose and there's a sore deep in my ear but can still touch it."  Advised she is neutropenic and Cipro called in to [facility] to be picked up after today's MRI.  Notified collaborative of this call.  Bringing patient in for [facility] visit.  She says she can get her in 15 minutes.

## 2016-07-25 NOTE — Telephone Encounter (Signed)
This RN attempted to call pt this am regarding prescription - number rang and then disconnected - 2 nd attempt with same outcome.  Called emergency contacts - daughter [name] with no answer and line disconnected.  Called husband - April [name] and obtained VM - message left requesting a return call.

## 2016-07-25 NOTE — ED Provider Notes (Signed)
[facility] DEPT Provider Note   CSN: [id number] Arrival date & time: 07/25/16  [id number]     History   Chief Complaint Chief Complaint  Patient presents with  . Neutropenia    HPI April [name] is a 55 y.o. female.  April [name] is a 55 y.o. Female with history of right breast cancer was in the emergency department from the cancer center today with fever and neutropenia. Patient reports beginning this morning she has had fevers, sore throat, ear pain headache, and feeling fatigued. Patient is being treated for right breast cancer with IV chemotherapy. She has received one round of chemotherapy. Last treatment was 07/17/2016. Her oncologist is Dr. Jana Hakim. She was seen at the cancer center today with a took blood cultures and check a CBC and CMP. He was revealed that heard white count is 0.3 and she has an [place] of 0. She was directed to the emergency department. Patient has a history of migraine headaches. Patient denies trouble swallowing, neck pain, neck stiffness, changes to her vision, numbness, tingling, weakness, chest pain, shortness of breath, coughing, wheezing, abdominal pain, nausea, vomiting or rashes.   The history is provided by the patient and medical records. No language interpreter was used.    Past Medical History:  Diagnosis Date  . Allergy   . Anxiety   . Arthritis    knees  . Bilateral ankle fractures 07/2015   [place]  . Cancer [facility]) dx June 22, 2016   right breast  . Depression    Multiple  episodes  in past.  . Fibromyalgia 2013   diagnosed by Dr. [name]  . Genital herpes 2005   Has outbreaks monthly  . GERD (gastroesophageal reflux disease)   . Migraine   . Obesity   . Right wrist fracture 06/2015    Patient Active Problem List   Diagnosis Date Noted  . Febrile neutropenia ([place]) 07/25/2016  . Thrombocytopenia ([place]) 07/25/2016  . Breast cancer of upper-outer quadrant of right female breast ([place]) 06/28/2016  . Breast cancer metastasized to  axillary lymph node ([place]) 06/23/2016  . Environmental and seasonal allergies 06/14/2016  . Elevated blood pressure reading without diagnosis of hypertension 04/28/2016  . Rash and nonspecific skin eruption 03/15/2016  . Herpes genitalis 03/15/2016  . Fracture 07/13/2015  . Bilateral fibular fractures 07/13/2015  . Fibromyalgia   . Anxiety 02/07/2012  . Perimenopausal 01/04/2012  . Depression (emotion) 01/04/2012    Past Surgical History:  Procedure Laterality Date  . PORTACATH PLACEMENT N/A 07/11/2016   Procedure: INSERTION PORT-A-CATH;  Surgeon: Excell Seltzer, MD;  Location: [facility] ORS;  Service: General;  Laterality: N/A;  . WISDOM TOOTH EXTRACTION  yrs ago    OB History    Gravida Para Term Preterm AB Living   3 1 1   2 1    SAB TAB Ectopic Multiple Live Births     2             Home Medications    Prior to Admission medications   Medication Sig Start Date End Date Taking? Authorizing Provider  acyclovir (ZOVIRAX) 400 MG tablet 1 tab by mouth twice daily 01/10/16  Yes [name], MD  desloratadine (CLARINEX) 5 MG tablet Take 1 tablet (5 mg total) by mouth daily. Patient taking differently: Take 5 mg by mouth every evening.  02/02/16  Yes [name], MD  dexamethasone (DECADRON) 4 MG tablet Take 2 tablets by mouth once a day on the day after chemotherapy and then take  2 tablets two times a day for 2 days. Take with food. 07/14/16  Yes Chauncey Cruel, MD  DULoxetine (CYMBALTA) 30 MG capsule 1 cap by mouth with 60  mg cap once daily for total of 90 mg daily Patient taking differently: Take 30 mg by mouth See admin instructions. Take 1 capsule by mouth daily along with 60  mg cap once daily for total of 90 mg daily 06/14/16  Yes [name], MD  DULoxetine (CYMBALTA) 60 MG capsule Take 1 capsule (60 mg total) by mouth daily. Take along with 30mg  capsule for a total of 90mg  of cymbalta per day. 06/27/16  Yes [name], MD  ibuprofen (ADVIL,MOTRIN) 800 MG  tablet Take 1 tablet (800 mg total) by mouth every 8 (eight) hours as needed. 07/24/16  Yes Chauncey Cruel, MD  lidocaine-prilocaine (EMLA) cream Apply 1 application topically as needed. 07/14/16  Yes Chauncey Cruel, MD  LORazepam (ATIVAN) 0.5 MG tablet Take 1 tablet (0.5 mg total) by mouth at bedtime as needed (Nausea or vomiting). 07/14/16  Yes Chauncey Cruel, MD  mometasone (NASONEX) 50 MCG/ACT nasal spray 2 sprays each nostril daily 02/02/16  Yes [name], MD  prochlorperazine (COMPAZINE) 10 MG tablet Take 1 tablet (10 mg total) by mouth every 6 (six) hours as needed (Nausea or vomiting). 07/14/16  Yes Chauncey Cruel, MD  omeprazole (PRILOSEC) 40 MG capsule Take 1 capsule (40 mg total) by mouth daily. 07/25/16   Susanne Borders, NP  SUMAtriptan (IMITREX) 50 MG tablet Take 1 tablet (50 mg total) by mouth every 2 (two) hours as needed for migraine. May repeat in 2 hours if headache persists or recurs. 07/25/16   Susanne Borders, NP    Family History Family History  Problem Relation Age of Onset  . Arthritis Mother   . Hypertension Mother   . Heart disease Mother   . Dementia Mother   . Irritable bowel syndrome Mother   . Emphysema Father   . Cancer Father     bladder  . Cerebral aneurysm Father     ruptured aneurysm was cause of death  . Bipolar disorder Daughter     Not clear if this is the case.  Possibly Bipolar II  . Depression Daughter   . Graves' disease Sister   . Vitiligo Sister   . Mental illness Brother     Depression  . Mental illness Sister     likely undiagnosed schizophrenia  . Mental illness Brother     Schizophrenia    Social History Social History  Substance Use Topics  . Smoking status: Former Smoker    Packs/day: 0.25    Years: 15.00    Types: Cigarettes    Quit date: 01/21/1994  . Smokeless tobacco: Never Used  . Alcohol use 1.2 - 2.4 oz/week    2 - 4 Standard drinks or equivalent per week     Comment: Couple beers or wine a week.      Allergies   Hydrocodone; Ultram [tramadol hcl]; and Gabapentin   Review of Systems Review of Systems  Constitutional: Positive for chills, fatigue and fever.  HENT: Positive for congestion, ear pain, rhinorrhea and sore throat. Negative for trouble swallowing and voice change.   Eyes: Negative for visual disturbance.  Respiratory: Negative for cough, shortness of breath and wheezing.   Cardiovascular: Negative for chest pain and palpitations.  Gastrointestinal: Negative for abdominal pain, nausea and vomiting.  Genitourinary: Negative for difficulty urinating, dysuria, frequency  and urgency.  Musculoskeletal: Negative for back pain and neck pain.  Skin: Negative for rash and wound.  Neurological: Positive for headaches. Negative for syncope, weakness and light-headedness.     Physical Exam Updated Vital Signs BP 154/86   Pulse 91   Temp 99.5 F (37.5 C) (Oral)   Resp (!) 27   SpO2 98%   Physical Exam  Constitutional: She is oriented to person, place, and time. She appears well-developed and well-nourished. No distress.  Nontoxic-appearing.  HENT:  Head: Normocephalic and atraumatic.  Right Ear: External ear normal.  Left Ear: External ear normal.  Mouth/Throat: No oropharyngeal exudate.  Mild middle ear effusion noted bilaterally. No TM erythema or loss of landmarks. Mild bilateral tonsillar hypertrophy without exudates. Uvula is midline without edema. No trismus. No drooling.  Eyes: Conjunctivae and EOM are normal. Pupils are equal, round, and reactive to light. Right eye exhibits no discharge. Left eye exhibits no discharge.  Neck: Normal range of motion. Neck supple. No JVD present. No tracheal deviation present.  No meningeal signs.  Cardiovascular: Normal rate, regular rhythm, normal heart sounds and intact distal pulses.  Exam reveals no gallop and no friction rub.   No murmur heard. Bilateral radial and dorsalis pedis pulses are intact. Good capillary refill.   Pulmonary/Chest: Effort normal and breath sounds normal. No stridor. No respiratory distress. She has no wheezes. She has no rales.  Lungs are clear to auscultation bilaterally.  Abdominal: Soft. Bowel sounds are normal. There is no tenderness. There is no guarding.  Abdomen is soft and nontender to palpation.  Musculoskeletal: She exhibits no edema or tenderness.  Patient is spontaneously moving all extremities in a coordinated fashion exhibiting good strength.  No lower extremity edema or tenderness.  Lymphadenopathy:    She has no cervical adenopathy.  Neurological: She is alert and oriented to person, place, and time. No cranial nerve deficit. Coordination normal.  Patient is alert and oriented. Cranial nerves are intact. Speech is clear and coherent. Sensation is intact in her bilateral upper and lower extremities.  Skin: Skin is warm and dry. Capillary refill takes less than 2 seconds. No rash noted. She is not diaphoretic. No erythema. No pallor.  Psychiatric: She has a normal mood and affect. Her behavior is normal.  Nursing note and vitals reviewed.    ED Treatments / Results  Labs (all labs ordered are listed, but only abnormal results are displayed) Labs Reviewed  BASIC METABOLIC PANEL - Abnormal; Notable for the following:       Result Value   Sodium 132 (*)    Glucose, Bld 106 (*)    Calcium 8.7 (*)    All other components within normal limits  CBC WITH DIFFERENTIAL/PLATELET - Abnormal; Notable for the following:    WBC 0.1 (*)    Platelets 75 (*)    Lymphs Abs 0.0 (*)    Monocytes Absolute 0.0 (*)    All other components within normal limits  RAPID STREP SCREEN (NOT AT [facility])  CULTURE, GROUP A STREP ([place])  URINE CULTURE  URINALYSIS, ROUTINE W REFLEX MICROSCOPIC (NOT AT [facility])  CBC  BASIC METABOLIC PANEL  I-STAT CG4 LACTIC ACID, ED  I-STAT CG4 LACTIC ACID, ED    EKG  EKG Interpretation None       Radiology Ct Head Wo Contrast  Result Date:  07/25/2016 CLINICAL DATA:  With patient with elevated temperature. Migraine headache. History a noted at the Breast Cancer. EXAM: CT HEAD WITHOUT CONTRAST TECHNIQUE: Contiguous  axial images were obtained from the base of the skull through the vertex without intravenous contrast. COMPARISON:  None. FINDINGS: Brain: Ventricles and sulci are appropriate for patient's age. No evidence for acute cortically based infarct, intracranial hemorrhage, mass lesion or mass-effect. Vascular: Unremarkable Skull: Intact. Sinuses/Orbits: Mastoid air cells unremarkable. Paranasal sinuses unremarkable. Other: None. IMPRESSION: No acute intracranial process. Electronically Signed   By: Lovey Newcomer M.D.   On: 07/25/2016 20:51    Procedures Procedures (including critical care time)  Medications Ordered in ED Medications  ceFEPIme (MAXIPIME) 2 g in dextrose 5 % 50 mL IVPB (not administered)  acyclovir (ZOVIRAX) tablet 400 mg (not administered)  loratadine (CLARITIN) tablet 10 mg (not administered)  DULoxetine (CYMBALTA) DR capsule 30 mg (not administered)  pantoprazole (PROTONIX) EC tablet 80 mg (not administered)  DULoxetine (CYMBALTA) DR capsule 60 mg (not administered)  prochlorperazine (COMPAZINE) tablet 10 mg (not administered)  LORazepam (ATIVAN) tablet 0.5 mg (not administered)  ibuprofen (ADVIL,MOTRIN) tablet 800 mg (not administered)  sodium chloride 0.9 % bolus 1,000 mL (0 mLs Intravenous Stopped 07/25/16 2132)  metoCLOPramide (REGLAN) injection 10 mg (10 mg Intravenous Given 07/25/16 1948)  diphenhydrAMINE (BENADRYL) injection 12.5 mg (12.5 mg Intravenous Given 07/25/16 1947)  ceFEPIme (MAXIPIME) 2 g in dextrose 5 % 50 mL IVPB (0 g Intravenous Stopped 07/25/16 2057)     Initial Impression / Assessment and Plan / ED Course  I have reviewed the triage vital signs and the nursing notes.  Pertinent labs & imaging results that were available during my care of the patient were reviewed by me and considered in  my medical decision making (see chart for details).  Clinical Course    Patient presented to the emergency department with fever neutropenia from the cancer center. Patient being treated for right breast cancer. She claims of sore throat and migraine headache. On arrival to the emergency department she has a temperature 100.8. She received Tylenol from cancer center. She has no focal neurological deficits. Her lungs cleared all secretion bilaterally. She is some mild oropharyngeal erythema without edema. TMs are normal bilaterally. Rapid strep was negative. BMP is unremarkable. CBC reveals neutropenia with a white count of 100. ANC is 0. Lactic acid is 0.88. CT head showed no acute process. Patient started on Maxipime for neutropenic fever on arrival. Will admit for neutropenic fever. Patient reports feeling better after fluids and pain control in the emergency department. She agrees with admission.  Dr. Alcario Drought accepted the patient for admission.   This patient was discussed with and evaluated by Dr. Winfred Leeds who agrees with assessment and plan.   Final Clinical Impressions(s) / ED Diagnoses   Final diagnoses:  Neutropenic fever [facility])    New Prescriptions New Prescriptions   No medications on file     Waynetta Pean, PA-C 07/25/16 [place], MD 07/26/16 [id number]

## 2016-07-25 NOTE — ED Provider Notes (Signed)
Complains of sore throat with painon swallowing since yesterday.  Has had headache for past 5 days , feels like migraine she's had in the [pst. decveloped "fever " wuit hemp 100.1 yesterday .   Orlie Dakin, MD 07/25/16 2045

## 2016-07-25 NOTE — ED Notes (Signed)
Bed: WA13 Expected date:  Expected time:  Means of arrival:  Comments: Pt from [facility] 

## 2016-07-25 NOTE — Progress Notes (Signed)
Pt returns to [facility] in [facility] for fevers, headaches, chills, sore throat, moth sores , sores on gums and in her right ear and right nare.  Pt is neutropenic with ANC of 0.0  Was seen by Dr. Jana Hakim yesterday.    Urine for u/a and culture obtained. Blood cultures done peripherally by lab and via port by this RN  Pt transferred to ED for febrile neutropenia. Report given to Alaina in ED and transported via w/c by this nurse. Pt voiced understanding of reason for ED.   SYMPTOM MANAGEMENT CLINIC    Chief Complaint: Fever, mucositis, dehydration, migraine headache  HPI:  April [name] 55 y.o. female diagnosed with breast cancer.  Currently undergoing dose dense AC chemotherapy regimen.    No history exists.    Review of Systems  Constitutional: Positive for chills, fever and malaise/fatigue.  HENT:       Complaints of mucositis.  Also has a sore to the right ear and inside her nose.  Neurological: Positive for headaches.  All other systems reviewed and are negative.   Past Medical History:  Diagnosis Date  . Allergy   . Anxiety   . Arthritis    knees  . Bilateral ankle fractures 07/2015   [place]  . Cancer [facility]) dx June 22, 2016   right breast  . Depression    Multiple  episodes  in past.  . Fibromyalgia 2013   diagnosed by Dr. [name]  . Genital herpes 2005   Has outbreaks monthly  . GERD (gastroesophageal reflux disease)   . Migraine   . Obesity   . Right wrist fracture 06/2015    Past Surgical History:  Procedure Laterality Date  . PORTACATH PLACEMENT N/A 07/11/2016   Procedure: INSERTION PORT-A-CATH;  Surgeon: Excell Seltzer, MD;  Location: [facility];  Service: General;  Laterality: N/A;  . WISDOM TOOTH EXTRACTION  yrs ago    has Perimenopausal; Depression (emotion); Anxiety; Fibromyalgia; Fracture; Bilateral fibular fractures; Rash and nonspecific skin eruption; Herpes genitalis; Elevated blood pressure reading without diagnosis of hypertension; Environmental and  seasonal allergies; Breast cancer metastasized to axillary lymph node ([place]); Breast cancer of upper-outer quadrant of right female breast ([place]); Febrile neutropenia ([place]); Thrombocytopenia ([place]); Headache; Dehydration; and Mucositis due to chemotherapy on her problem list.    is allergic to hydrocodone; ultram [tramadol hcl]; and gabapentin.    Medication List       Accurate as of 07/25/16  5:43 PM. Always use your most recent med list.          acyclovir 400 MG tablet Commonly known as:  ZOVIRAX 1 tab by mouth twice daily   ciprofloxacin 500 MG tablet Commonly known as:  CIPRO Take 1 tablet (500 mg total) by mouth 2 (two) times daily.   desloratadine 5 MG tablet Commonly known as:  CLARINEX Take 1 tablet (5 mg total) by mouth daily.   dexamethasone 4 MG tablet Commonly known as:  DECADRON Take 2 tablets by mouth once a day on the day after chemotherapy and then take 2 tablets two times a day for 2 days. Take with food.   DULoxetine 30 MG capsule Commonly known as:  CYMBALTA 1 cap by mouth with 60  mg cap once daily for total of 90 mg daily   DULoxetine 60 MG capsule Commonly known as:  CYMBALTA Take 1 capsule (60 mg total) by mouth daily. Take along with '30mg'$  capsule for a total of '90mg'$  of cymbalta per day.   ibuprofen 600  MG tablet Commonly known as:  ADVIL,MOTRIN Take 1 tablet (600 mg total) by mouth every 6 (six) hours as needed.   ibuprofen 800 MG tablet Commonly known as:  ADVIL,MOTRIN Take 1 tablet (800 mg total) by mouth every 8 (eight) hours as needed.   lidocaine-prilocaine cream Commonly known as:  EMLA Apply 1 application topically as needed.   LORazepam 0.5 MG tablet Commonly known as:  ATIVAN Take 1 tablet (0.5 mg total) by mouth at bedtime as needed (Nausea or vomiting).   mometasone 50 MCG/ACT nasal spray Commonly known as:  NASONEX 2 sprays each nostril daily   omeprazole 40 MG capsule Commonly known as:  PRILOSEC Take 1 capsule (40 mg total)  by mouth daily.   prochlorperazine 10 MG tablet Commonly known as:  COMPAZINE Take 1 tablet (10 mg total) by mouth every 6 (six) hours as needed (Nausea or vomiting).   SUMAtriptan 50 MG tablet Commonly known as:  IMITREX Take 1 tablet (50 mg total) by mouth every 2 (two) hours as needed for migraine. May repeat in 2 hours if headache persists or recurs.        PHYSICAL EXAMINATION  Oncology Vitals 07/26/2016 07/26/2016  Height - -  Weight - -  Weight (lbs) - -  BMI (kg/m2) - -  Temp 99.3 100  Pulse 93 -  Resp 20 -  SpO2 97 -  BSA (m2) - -   BP Readings from Last 2 Encounters:  07/26/16 130/82  07/25/16 138/77    Physical Exam  Constitutional: She is oriented to person, place, and time. She appears dehydrated. She appears unhealthy. She has a sickly appearance.  HENT:  Head: Normocephalic and atraumatic.  Mouth/Throat: Oropharynx is clear and moist.  Mucositis noted to oral mucosa.  Also has some scabbed lesions to the inside of her nose and to the right ear.  Eyes: Conjunctivae and EOM are normal. Pupils are equal, round, and reactive to light. Right eye exhibits no discharge. Left eye exhibits no discharge. No scleral icterus.  Neck: Normal range of motion. Neck supple. No JVD present. No tracheal deviation present. No thyromegaly present.  Cardiovascular: Normal rate, regular rhythm, normal heart sounds and intact distal pulses.   Pulmonary/Chest: Effort normal and breath sounds normal. No respiratory distress. She has no wheezes. She has no rales. She exhibits no tenderness.  Abdominal: Soft. Bowel sounds are normal. She exhibits no distension and no mass. There is no tenderness. There is no rebound and no guarding.  Musculoskeletal: Normal range of motion. She exhibits no edema or tenderness.  Lymphadenopathy:    She has no cervical adenopathy.  Neurological: She is alert and oriented to person, place, and time. Gait normal.  Skin: Skin is warm and dry. No rash noted.  No erythema. No pallor.  Psychiatric:  Very anxious.  Nursing note and vitals reviewed.   LABORATORY DATA:. Admission on 07/25/2016  Component Date Value Ref Range Status  . Lactic Acid, Venous 07/25/2016 0.88  0.5 - 1.9 mmol/L Final  . Sodium 07/25/2016 132* 135 - 145 mmol/L Final  . Potassium 07/25/2016 3.6  3.5 - 5.1 mmol/L Final  . Chloride 07/25/2016 102  101 - 111 mmol/L Final  . CO2 07/25/2016 22  22 - 32 mmol/L Final  . Glucose, Bld 07/25/2016 106* 65 - 99 mg/dL Final  . BUN 07/25/2016 12  6 - 20 mg/dL Final  . Creatinine, Ser 07/25/2016 0.63  0.44 - 1.00 mg/dL Final  . Calcium 07/25/2016 8.7* 8.9 -  10.3 mg/dL Final  . GFR calc non Af Amer 07/25/2016 >60  >60 mL/min Final  . GFR calc Af Amer 07/25/2016 >60  >60 mL/min Final   Comment: (NOTE) The eGFR has been calculated using the CKD EPI equation. This calculation has not been validated in all clinical situations. eGFR's persistently <60 mL/min signify possible Chronic Kidney Disease.   . Anion gap 07/25/2016 8  5 - 15 Final  . WBC 07/25/2016 0.1* 4.0 - 10.5 K/uL Final   Comment: REPEATED TO VERIFY CRITICAL RESULT CALLED TO, READ BACK BY AND VERIFIED WITH: Annabell Howells [id number] @ 2006 BY J SCOTTON   . RBC 07/25/2016 3.91  3.87 - 5.11 MIL/uL Final  . Hemoglobin 07/25/2016 12.6  12.0 - 15.0 g/dL Final  . HCT 07/25/2016 36.2  36.0 - 46.0 % Final  . MCV 07/25/2016 92.6  78.0 - 100.0 fL Final  . MCH 07/25/2016 32.2  26.0 - 34.0 pg Final  . MCHC 07/25/2016 34.8  30.0 - 36.0 g/dL Final  . RDW 07/25/2016 13.3  11.5 - 15.5 % Final  . Platelets 07/25/2016 75* 150 - 400 K/uL Final  . Neutrophils Relative % 07/25/2016 0  % Final  . Lymphocytes Relative 07/25/2016 0  % Final  . Monocytes Relative 07/25/2016 0  % Final  . Eosinophils Relative 07/25/2016 0  % Final  . Basophils Relative 07/25/2016 0  % Final  . Band Neutrophils 07/25/2016 0  % Final  . nRBC 07/25/2016 0  0 /100 WBC Final  . Lymphs Abs 07/25/2016 0.0* 0.7  - 4.0 K/uL Final  . Monocytes Absolute 07/25/2016 0.0* 0.1 - 1.0 K/uL Final  . Eosinophils Absolute 07/25/2016 0.0  0.0 - 0.7 K/uL Final  . Basophils Absolute 07/25/2016 0.0  0.0 - 0.1 K/uL Final  . WBC Morphology 07/25/2016 TOO FEW TO COUNT, SMEAR AVAILABLE FOR REVIEW   Final   Comment: RARE LYMPH SEEN WHITE COUNT CONFIRMED ON SMEAR   . Smear Review 07/25/2016 PLATELET COUNT CONFIRMED BY SMEAR   Final  . Streptococcus, Group A Screen (Dir* 07/25/2016 NEGATIVE  NEGATIVE Final   Comment: (NOTE) A Rapid Antigen test may result negative if the antigen level in the sample is below the detection level of this test. The FDA has not cleared this test as a stand-alone test therefore the rapid antigen negative result has reflexed to a Group A Strep culture.   Marland Kitchen Specimen Description 07/26/2016 THROAT   Final  . Special Requests 07/26/2016 NONE Reflexed from [id number]   Final  . Culture 07/26/2016    Final                   Value:TOO YOUNG TO READ Performed at [facility]   . Report Status 07/26/2016 PENDING   Incomplete  . Lactic Acid, Venous 07/25/2016 0.87  0.5 - 1.9 mmol/L Final  . Color, Urine 07/25/2016 YELLOW  YELLOW Final  . APPearance 07/25/2016 CLEAR  CLEAR Final  . Specific Gravity, Urine 07/25/2016 1.009  1.005 - 1.030 Final  . pH 07/25/2016 5.5  5.0 - 8.0 Final  . Glucose, UA 07/25/2016 NEGATIVE  NEGATIVE mg/dL Final  . Hgb urine dipstick 07/25/2016 NEGATIVE  NEGATIVE Final  . Bilirubin Urine 07/25/2016 NEGATIVE  NEGATIVE Final  . Ketones, ur 07/25/2016 NEGATIVE  NEGATIVE mg/dL Final  . Protein, ur 07/25/2016 NEGATIVE  NEGATIVE mg/dL Final  . Nitrite 07/25/2016 NEGATIVE  NEGATIVE Final  . Leukocytes, UA 07/25/2016 NEGATIVE  NEGATIVE Final  . WBC 07/26/2016  0.5* 4.0 - 10.5 K/uL Final  . RBC 07/26/2016 3.78* 3.87 - 5.11 MIL/uL Final  . Hemoglobin 07/26/2016 12.0  12.0 - 15.0 g/dL Final  . HCT 07/26/2016 34.6* 36.0 - 46.0 % Final  . MCV 07/26/2016 91.5  78.0 - 100.0 fL  Final  . MCH 07/26/2016 31.7  26.0 - 34.0 pg Final  . MCHC 07/26/2016 34.7  30.0 - 36.0 g/dL Final  . RDW 07/26/2016 13.2  11.5 - 15.5 % Final  . Platelets 07/26/2016 76* 150 - 400 K/uL Final  . Sodium 07/26/2016 134* 135 - 145 mmol/L Final  . Potassium 07/26/2016 3.7  3.5 - 5.1 mmol/L Final  . Chloride 07/26/2016 103  101 - 111 mmol/L Final  . CO2 07/26/2016 23  22 - 32 mmol/L Final  . Glucose, Bld 07/26/2016 109* 65 - 99 mg/dL Final  . BUN 07/26/2016 7  6 - 20 mg/dL Final  . Creatinine, Ser 07/26/2016 0.61  0.44 - 1.00 mg/dL Final  . Calcium 07/26/2016 8.5* 8.9 - 10.3 mg/dL Final  . GFR calc non Af Amer 07/26/2016 >60  >60 mL/min Final  . GFR calc Af Amer 07/26/2016 >60  >60 mL/min Final   Comment: (NOTE) The eGFR has been calculated using the CKD EPI equation. This calculation has not been validated in all clinical situations. eGFR's persistently <60 mL/min signify possible Chronic Kidney Disease.   . Anion gap 07/26/2016 8  5 - 15 Final  Appointment on 07/25/2016  Component Date Value Ref Range Status  . BLOOD CULTURE, ROUTINE 07/25/2016 Preliminary report   Preliminary  . RESULT 1 07/25/2016 Comment   Preliminary  . Glucose 07/25/2016 Negative  Negative mg/dL Final  . Bilirubin (Urine) 07/25/2016 Negative  Negative Final  . Ketones 07/25/2016 Negative  Negative mg/dL Final  . Specific Gravity, Urine 07/25/2016 1.005  1.003 - 1.035 Final  . Blood 07/25/2016 Negative  Negative Final  . pH 07/25/2016 6.0  4.6 - 8.0 Final  . Protein 07/25/2016 Negative  Negative- <30 mg/dL Final  . Urobilinogen, UR 07/25/2016 0.2  0.2 - 1 mg/dL Final  . Nitrite 07/25/2016 Negative  Negative Final  . Leukocyte Esterase 07/25/2016 Negative  Negative Final  . RBC / HPF 07/25/2016 Negative  0 - 2 Final  . WBC, UA 07/25/2016 0-2  0 - 2 Final  . Epithelial Cells 07/25/2016 Occasional  Negative- Few Final  . BLOOD CULTURE, ROUTINE 07/25/2016 Preliminary report   Preliminary  . RESULT 1 07/25/2016  Comment   Preliminary  Appointment on 07/24/2016  Component Date Value Ref Range Status  . WBC 07/24/2016 0.3* 3.9 - 10.3 10e3/uL Final  . NEUT# 07/24/2016 0.0* 1.5 - 6.5 10e3/uL Final  . HGB 07/24/2016 13.8  11.6 - 15.9 g/dL Final  . HCT 07/24/2016 41.7  34.8 - 46.6 % Final  . Platelets 07/24/2016 91* 145 - 400 10e3/uL Final  . MCV 07/24/2016 94.8  79.5 - 101.0 fL Final  . MCH 07/24/2016 31.3  25.1 - 34.0 pg Final  . MCHC 07/24/2016 33.0  31.5 - 36.0 g/dL Final  . RBC 07/24/2016 4.40  3.70 - 5.45 10e6/uL Final  . RDW 07/24/2016 13.8  11.2 - 14.5 % Final  . lymph# 07/24/2016 0.3* 0.9 - 3.3 10e3/uL Final  . MONO# 07/24/2016 0.0* 0.1 - 0.9 10e3/uL Final  . Eosinophils Absolute 07/24/2016 0.1  0.0 - 0.5 10e3/uL Final  . Basophils Absolute 07/24/2016 0.0  0.0 - 0.1 10e3/uL Final  . NEUT% 07/24/2016 1.3* 38.4 - 76.8 %  Final  . LYMPH% 07/24/2016 78.4* 14.0 - 49.7 % Final  . MONO% 07/24/2016 3.1  0.0 - 14.0 % Final  . EOS% 07/24/2016 17.2* 0.0 - 7.0 % Final  . BASO% 07/24/2016 0.0  0.0 - 2.0 % Final  . Sodium 07/24/2016 135* 136 - 145 mEq/L Final  . Potassium 07/24/2016 3.9  3.5 - 5.1 mEq/L Final  . Chloride 07/24/2016 102  98 - 109 mEq/L Final  . CO2 07/24/2016 24  22 - 29 mEq/L Final  . Glucose 07/24/2016 93  70 - 140 mg/dl Final  . BUN 07/24/2016 10.2  7.0 - 26.0 mg/dL Final  . Creatinine 07/24/2016 0.7  0.6 - 1.1 mg/dL Final  . Total Bilirubin 07/24/2016 0.67  0.20 - 1.20 mg/dL Final  . Alkaline Phosphatase 07/24/2016 107  40 - 150 U/L Final  . AST 07/24/2016 9  5 - 34 U/L Final  . ALT 07/24/2016 13  0 - 55 U/L Final  . Total Protein 07/24/2016 7.1  6.4 - 8.3 g/dL Final  . Albumin 07/24/2016 3.3* 3.5 - 5.0 g/dL Final  . Calcium 07/24/2016 9.5  8.4 - 10.4 mg/dL Final  . Anion Gap 07/24/2016 8  3 - 11 mEq/L Final  . EGFR 07/24/2016 >90  >90 ml/min/1.73 m2 Final    RADIOGRAPHIC STUDIES: Ct Head Wo Contrast  Result Date: 07/25/2016 CLINICAL DATA:  With patient with elevated  temperature. Migraine headache. History a noted at the Breast Cancer. EXAM: CT HEAD WITHOUT CONTRAST TECHNIQUE: Contiguous axial images were obtained from the base of the skull through the vertex without intravenous contrast. COMPARISON:  None. FINDINGS: Brain: Ventricles and sulci are appropriate for patient's age. No evidence for acute cortically based infarct, intracranial hemorrhage, mass lesion or mass-effect. Vascular: Unremarkable Skull: Intact. Sinuses/Orbits: Mastoid air cells unremarkable. Paranasal sinuses unremarkable. Other: None. IMPRESSION: No acute intracranial process. Electronically Signed   By: Lovey Newcomer M.D.   On: 07/25/2016 20:51    ASSESSMENT/PLAN:    Headache Patient states that she has been suffering with a headache that is typical of her migraine headaches for the past few days.  She states that her headaches worsened when she received her first cycle of chemotherapy on 07/17/2016.  She was seen by Dr. Jana Hakim recently; and prescribed Imitrex for treatment of her headaches; but patient did not pick up the Imitrex that was prescribed.  Patient appears dehydrated while at the cancer centers today; and received IV fluid rehydration.  Initially-patient stated that her headache was feeling somewhat resolved; but later in the afternoon.  Patient was crying because her head hurt.  Given patient's continued headache and febrile neutropenia-patient will be transported to the emergency department for further evaluation and management today.  Febrile neutropenia ([place]) Patient received her first cycle of chemotherapy on 07/17/2016.  Patient presented to the [place] today with complaint of fever to maximum 100.3 within the past few days.  She also has mucositis; and feels dehydrated.  Blood counts obtained today reveal a WBC of 0.3, and ANC is 0.0.  Vitals obtained today reveal a tachycardic rate of 108, and temperature was 100.3.  Blood cultures 2 were obtained and a urine  was collected as well.  But the urinalysis and urine culture results are pending at this time.  Due to patient's febrile neutropenia-patient will be transported to the emergency department for further evaluation and management.  Brief history.  Report were called to the emergency department charge nurse; prior to patient being transported to the  emergency department via wheelchair to the emergency department per the [place].  Note: Patient should be considered a full code; since there are no advanced directives in patient's chart.  Dehydration Patient states she's had very little oral intake recently; and feels dehydrated today.  Patient received IV fluid rehydration while at the cancer  Breast cancer of upper-outer quadrant of right female breast [facility]) Patient received her first cycle of dose dense AC chemotherapy on 07/17/2016.  See further  notes for details of today's visit.   patient is scheduled to return on 07/23/2016 for labs, flush, and her next cycle of chemotherapy   Mucositis due to chemotherapy Patient does appear to have some mild mucositis to her oral mucosa.  She also has some scabbed areas to the inside of her nose and to the right ear as well.   Patient stated understanding of all instructions; and was in agreement with this plan of care. The patient knows to call the clinic with any problems, questions or concerns.   Total time spent with patient was 40 minutes;  with greater than 75 percent of that time spent in face to face counseling regarding patient's symptoms,  and coordination of care and follow up.  Disclaimer:This dictation was prepared with Dragon/digital dictation along with [employer]. Any transcriptional errors that result from this process are unintentional.  Drue Second, NP 07/26/2016

## 2016-07-26 ENCOUNTER — Encounter: Payer: Self-pay | Admitting: Nurse Practitioner

## 2016-07-26 DIAGNOSIS — K1231 Oral mucositis (ulcerative) due to antineoplastic therapy: Secondary | ICD-10-CM | POA: Insufficient documentation

## 2016-07-26 DIAGNOSIS — E860 Dehydration: Secondary | ICD-10-CM | POA: Insufficient documentation

## 2016-07-26 LAB — BASIC METABOLIC PANEL
ANION GAP: 8 (ref 5–15)
BUN: 7 mg/dL (ref 6–20)
CALCIUM: 8.5 mg/dL — AB (ref 8.9–10.3)
CO2: 23 mmol/L (ref 22–32)
Chloride: 103 mmol/L (ref 101–111)
Creatinine, Ser: 0.61 mg/dL (ref 0.44–1.00)
GLUCOSE: 109 mg/dL — AB (ref 65–99)
POTASSIUM: 3.7 mmol/L (ref 3.5–5.1)
Sodium: 134 mmol/L — ABNORMAL LOW (ref 135–145)

## 2016-07-26 LAB — CBC
HEMATOCRIT: 34.6 % — AB (ref 36.0–46.0)
Hemoglobin: 12 g/dL (ref 12.0–15.0)
MCH: 31.7 pg (ref 26.0–34.0)
MCHC: 34.7 g/dL (ref 30.0–36.0)
MCV: 91.5 fL (ref 78.0–100.0)
PLATELETS: 76 10*3/uL — AB (ref 150–400)
RBC: 3.78 MIL/uL — AB (ref 3.87–5.11)
RDW: 13.2 % (ref 11.5–15.5)
WBC: 0.5 10*3/uL — AB (ref 4.0–10.5)

## 2016-07-26 LAB — URINE CULTURE

## 2016-07-26 MED ORDER — VANCOMYCIN HCL IN DEXTROSE 1-5 GM/200ML-% IV SOLN
1000.0000 mg | Freq: Three times a day (TID) | INTRAVENOUS | Status: DC
  Administered 2016-07-26 – 2016-07-27 (×6): 1000 mg via INTRAVENOUS
  Filled 2016-07-26 (×6): qty 200

## 2016-07-26 MED ORDER — ACETAMINOPHEN 325 MG PO TABS
650.0000 mg | ORAL_TABLET | Freq: Four times a day (QID) | ORAL | Status: DC | PRN
  Administered 2016-07-26 – 2016-07-27 (×3): 650 mg via ORAL
  Filled 2016-07-26 (×3): qty 2

## 2016-07-26 MED ORDER — CETYLPYRIDINIUM CHLORIDE 0.05 % MT LIQD
7.0000 mL | Freq: Two times a day (BID) | OROMUCOSAL | Status: DC
  Administered 2016-07-26 – 2016-07-27 (×3): 7 mL via OROMUCOSAL

## 2016-07-26 MED ORDER — SODIUM CHLORIDE 0.9% FLUSH
10.0000 mL | INTRAVENOUS | Status: DC | PRN
Start: 2016-07-26 — End: 2016-07-27

## 2016-07-26 MED ORDER — CHLORHEXIDINE GLUCONATE 0.12 % MT SOLN
15.0000 mL | Freq: Two times a day (BID) | OROMUCOSAL | Status: DC
  Administered 2016-07-26 – 2016-07-27 (×2): 15 mL via OROMUCOSAL
  Filled 2016-07-26 (×3): qty 15

## 2016-07-26 MED ORDER — SODIUM CHLORIDE 0.9% FLUSH
10.0000 mL | Freq: Two times a day (BID) | INTRAVENOUS | Status: DC
  Administered 2016-07-26: 10 mL

## 2016-07-26 MED ORDER — MAGIC MOUTHWASH W/LIDOCAINE
15.0000 mL | Freq: Four times a day (QID) | ORAL | Status: DC | PRN
  Administered 2016-07-26: 15 mL via ORAL
  Filled 2016-07-26 (×2): qty 15

## 2016-07-26 MED ORDER — SODIUM CHLORIDE 0.9 % IV BOLUS (SEPSIS)
500.0000 mL | Freq: Once | INTRAVENOUS | Status: AC
  Administered 2016-07-26: 500 mL via INTRAVENOUS

## 2016-07-26 NOTE — Assessment & Plan Note (Signed)
Patient received her first cycle of dose dense AC chemotherapy on 07/17/2016.  See further  notes for details of today's visit.   patient is scheduled to return on 07/23/2016 for labs, flush, and her next cycle of chemotherapy

## 2016-07-26 NOTE — Assessment & Plan Note (Signed)
Patient received her first cycle of chemotherapy on 07/17/2016.  Patient presented to the [place] today with complaint of fever to maximum 100.3 within the past few days.  She also has mucositis; and feels dehydrated.  Blood counts obtained today reveal a WBC of 0.3, and ANC is 0.0.  Vitals obtained today reveal a tachycardic rate of 108, and temperature was 100.3.  Blood cultures 2 were obtained and a urine was collected as well.  But the urinalysis and urine culture results are pending at this time.  Due to patient's febrile neutropenia-patient will be transported to the emergency department for further evaluation and management.  Brief history.  Report were called to the emergency department charge nurse; prior to patient being transported to the emergency department via wheelchair to the emergency department per the [facility].  Note: Patient should be considered a full code; since there are no advanced directives in patient's chart.

## 2016-07-26 NOTE — Progress Notes (Signed)
Patient also has skin involvement, cellulitis of buttocks, no involvement of perineum.  Will add vancomycin for the cellulitis.

## 2016-07-26 NOTE — Assessment & Plan Note (Signed)
Patient does appear to have some mild mucositis to her oral mucosa.  She also has some scabbed areas to the inside of her nose and to the right ear as well.

## 2016-07-26 NOTE — Assessment & Plan Note (Signed)
Patient states that she has been suffering with a headache that is typical of her migraine headaches for the past few days.  She states that her headaches worsened when she received her first cycle of chemotherapy on 07/17/2016.  She was seen by Dr. Jana Hakim recently; and prescribed Imitrex for treatment of her headaches; but patient did not pick up the Imitrex that was prescribed.  Patient appears dehydrated while at the cancer centers today; and received IV fluid rehydration.  Initially-patient stated that her headache was feeling somewhat resolved; but later in the afternoon.  Patient was crying because her head hurt.  Given patient's continued headache and febrile neutropenia-patient will be transported to the emergency department for further evaluation and management today.

## 2016-07-26 NOTE — Assessment & Plan Note (Signed)
Patient states she's had very little oral intake recently; and feels dehydrated today.  Patient received IV fluid rehydration while at the cancer

## 2016-07-26 NOTE — Progress Notes (Signed)
Pharmacy Antibiotic Note  April [name] is a 55 y.o. female admitted on 07/25/2016 with febrile neutropenia, cellulitis.  Pharmacy has been consulted for Vancomycin dosing.  Plan: Vancomycin 1gm IV every 8 hours.  Goal trough 15-20 mcg/mL.  Weight: 226 lb 13.7 oz (102.9 kg)  Temp (24hrs), Avg:100.5 F (38.1 C), Min:99.5 F (37.5 C), Max:101.1 F (38.4 C)   Recent Labs Lab 07/24/16 1224 07/25/16 1834 07/25/16 1920 07/25/16 2219  WBC 0.3*  --  0.1*  --   CREATININE 0.7  --  0.63  --   LATICACIDVEN  --  0.88  --  0.87    Estimated Creatinine Clearance: 93.9 mL/min (by C-G formula based on SCr of 0.8 mg/dL).    Allergies  Allergen Reactions  . Hydrocodone Nausea Only and Other (See Comments)    dizziness  . Ultram [Tramadol Hcl] Nausea Only  . Gabapentin Rash    Antimicrobials this admission: 8/22 cefepime >> 8/23 vancomycin >>  Dose adjustments this admission:   Microbiology results: 8/22 BCx:  8/22 UCx:  8/22 rapid strep: negative  Thank you for allowing pharmacy to be a part of this patient's care.  Nani Skillern [place] 07/26/2016 12:51 AM

## 2016-07-26 NOTE — Progress Notes (Signed)
Patient ID: April [name], female   DOB: 1961-04-22, 55 y.o.   MRN: [id number]                                                                PROGRESS NOTE                                                                                                                                                                                                             Patient Demographics:    April [name], is a 55 y.o. female, DOB - [birth year], UGQ:[id number]  Admit date - 07/25/2016   Admitting Physician Etta Quill, DO  Outpatient Primary MD for the patient is [facility] [place], [place]  LOS - 1  Outpatient Specialists:   Chief Complaint  Patient presents with  . Neutropenia       Brief Narrative   55 y.o. female with medical history significant of R BRCA, chemo on 8/14, took Neulasta on 8/15.  Patient presents to ED with c/o fever (measured at 101.0 here in ED).  BCx drawn at cancer center today before coming to ED.  WBC was 0.3 with ANC 0 yesterday, today is 0.1 with ANC 0.  Patient reports associated symptoms of severe sore throat, headache, does have h/o migraines.  No CP, SOB, cough, N/V/D, rashes.  ED Course: Strep test negative.   Subjective:    [name] today has been afebrile this am.  Vanco added and no side affects noted by patient .  Pt tolerating vanco/cefepime for cellulitis, febrile neutropenia.  Still with slight sore throat.   No headache, No chest pain, No abdominal pain - No Nausea, No new weakness tingling or numbness, No Cough - SOB.    Assessment  & Plan :    Principal Problem:   Febrile neutropenia (HCC) Active Problems:   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)   Breast cancer of upper-outer quadrant of right female breast (HCC)   Thrombocytopenia ([place])   1.  Febrile neutropenia Secondary to ? Cellulitis Cont vanco 8/23=> Cont cefepime 8/22 =>  2. BRCA on chemo last 8/14 Oncology consult [place]  3. Thrombocytopenia Stable Check  cbc in am  4. H/o herpes  Cont acyclovir po.   Code Status :FULL CODE  Family Communication  :  Disposition Plan  : home  Barriers For Discharge :   Consults  :  oncology  Procedures  :   DVT Prophylaxis  :  SCDs   Lab Results  Component Value Date   PLT 76 (L) 07/26/2016    Antibiotics  :    Anti-infectives    Start     Dose/Rate Route Frequency Ordered Stop   07/26/16 0400  ceFEPIme (MAXIPIME) 2 g in dextrose 5 % 50 mL IVPB     2 g 100 mL/hr over 30 Minutes Intravenous Every 8 hours 07/25/16 2128     07/26/16 0100  vancomycin (VANCOCIN) IVPB 1000 mg/200 mL premix     1,000 mg 200 mL/hr over 60 Minutes Intravenous Every 8 hours 07/26/16 0050     07/25/16 2200  acyclovir (ZOVIRAX) tablet 400 mg    Comments:  1 tab by mouth twice daily     400 mg Oral 2 times daily 07/25/16 2136     07/25/16 1945  ceFEPIme (MAXIPIME) 2 g in dextrose 5 % 50 mL IVPB     2 g 100 mL/hr over 30 Minutes Intravenous  Once 07/25/16 1934 07/25/16 2057        Objective:   Vitals:   07/25/16 2257 07/26/16 0015 07/26/16 0113 07/26/16 0628  BP: 129/76  128/79 120/85  Pulse: 100 98  96  Resp: '12 20  20  '$ Temp:  (!) 101.1 F (38.4 C) 100.2 F (37.9 C) 99.9 F (37.7 C)  TempSrc:  Oral  Oral  SpO2: 99% 100%  99%  Weight:  102.9 kg (226 lb 13.7 oz)      Wt Readings from Last 3 Encounters:  07/26/16 102.9 kg (226 lb 13.7 oz)  07/25/16 101.7 kg (224 lb 3.2 oz)  07/24/16 101.3 kg (223 lb 4.8 oz)     Intake/Output Summary (Last 24 hours) at 07/26/16 0827 Last data filed at 07/26/16 0739  Gross per 24 hour  Intake             1430 ml  Output             1550 ml  Net             -120 ml     Physical Exam  Awake Alert, Oriented X 3, No new F.N deficits, Normal affect [place].AT,PERRAL Supple Neck,No JVD, No cervical lymphadenopathy appriciated.  Symmetrical Chest wall movement, Good air movement bilaterally, CTAB RRR,No Gallops,Rubs or new Murmurs, No Parasternal Heave +ve B.Sounds,  Abd Soft, No tenderness, No organomegaly appriciated, No rebound - guarding or rigidity. No Cyanosis, Clubbing or edema, No new Rash or bruise  8cm area of redness on the left buttock with central small skin ulcer 0.5cm where was excoriated ?    Data Review:    CBC  Recent Labs Lab 07/24/16 1224 07/25/16 1920 07/26/16 0500  WBC 0.3* 0.1* 0.5*  HGB 13.8 12.6 12.0  HCT 41.7 36.2 34.6*  PLT 91* 75* 76*  MCV 94.8 92.6 91.5  MCH 31.3 32.2 31.7  MCHC 33.0 34.8 34.7  RDW 13.8 13.3 13.2  LYMPHSABS 0.3* 0.0*  --   MONOABS 0.0* 0.0*  --   EOSABS 0.1 0.0  --   BASOSABS 0.0 0.0  --     Chemistries   Recent Labs Lab 07/24/16 1224 07/25/16 1920 07/26/16 0500  NA 135* 132* 134*  K 3.9 3.6 3.7  CL  --  102 103  CO2 '24 22 23  '$ GLUCOSE 93 106*  109*  BUN 10.'2 12 7  '$ CREATININE 0.7 0.63 0.61  CALCIUM 9.5 8.7* 8.5*  AST 9  --   --   ALT 13  --   --   ALKPHOS 107  --   --   BILITOT 0.67  --   --    ------------------------------------------------------------------------------------------------------------------ No results for input(s): CHOL, HDL, LDLCALC, TRIG, CHOLHDL, LDLDIRECT in the last 72 hours.  Lab Results  Component Value Date   HGBA1C 5.4 06/23/2016   ------------------------------------------------------------------------------------------------------------------ No results for input(s): TSH, T4TOTAL, T3FREE, THYROIDAB in the last 72 hours.  Invalid input(s): FREET3 ------------------------------------------------------------------------------------------------------------------ No results for input(s): VITAMINB12, FOLATE, FERRITIN, TIBC, IRON, RETICCTPCT in the last 72 hours.  Coagulation profile No results for input(s): INR, PROTIME in the last 168 hours.  No results for input(s): DDIMER in the last 72 hours.  Cardiac Enzymes No results for input(s): CKMB, TROPONINI, MYOGLOBIN in the last 168 hours.  Invalid input(s):  CK ------------------------------------------------------------------------------------------------------------------ No results found for: BNP  Inpatient Medications  Scheduled Meds: . acyclovir  400 mg Oral BID  . antiseptic oral rinse  7 mL Mouth Rinse q12n4p  . ceFEPime (MAXIPIME) IV  2 g Intravenous Q8H  . chlorhexidine  15 mL Mouth Rinse BID  . DULoxetine  90 mg Oral Daily  . loratadine  10 mg Oral Daily  . pantoprazole  80 mg Oral Daily  . vancomycin  1,000 mg Intravenous Q8H   Continuous Infusions:  PRN Meds:.acetaminophen, ibuprofen, LORazepam, prochlorperazine  Micro Results Recent Results (from the past 240 hour(s))  Rapid strep screen     Status: None   Collection Time: 07/25/16  7:25 PM  Result Value Ref Range Status   Streptococcus, Group A Screen (Direct) NEGATIVE NEGATIVE Final    Comment: (NOTE) A Rapid Antigen test may result negative if the antigen level in the sample is below the detection level of this test. The FDA has not cleared this test as a stand-alone test therefore the rapid antigen negative result has reflexed to a Group A Strep culture.     Radiology Reports Ct Head Wo Contrast  Result Date: 07/25/2016 CLINICAL DATA:  With patient with elevated temperature. Migraine headache. History a noted at the Breast Cancer. EXAM: CT HEAD WITHOUT CONTRAST TECHNIQUE: Contiguous axial images were obtained from the base of the skull through the vertex without intravenous contrast. COMPARISON:  None. FINDINGS: Brain: Ventricles and sulci are appropriate for patient's age. No evidence for acute cortically based infarct, intracranial hemorrhage, mass lesion or mass-effect. Vascular: Unremarkable Skull: Intact. Sinuses/Orbits: Mastoid air cells unremarkable. Paranasal sinuses unremarkable. Other: None. IMPRESSION: No acute intracranial process. Electronically Signed   By: Lovey Newcomer M.D.   On: 07/25/2016 20:51   Nm Bone Scan Whole Body  Result Date:  07/11/2016 CLINICAL DATA:  RIGHT breast cancer, RIGHT shoulder pain EXAM: NUCLEAR MEDICINE WHOLE BODY BONE SCAN TECHNIQUE: Whole body anterior and posterior images were obtained approximately 3 hours after intravenous injection of radiopharmaceutical. RADIOPHARMACEUTICALS:  26.5 mCi Technetium-74mMDP IV COMPARISON:  None Radiographic correlation: CT abdomen and pelvis 07/11/2016, BILATERAL ankle radiographs 07/13/2015 FINDINGS: Minimal uptake at the shoulders, knees and feet typically degenerative. Increased uptake at lateral LEFT ankle corresponding to oblique distal fibular fracture on prior radiographs No definite additional sites of abnormal osseous tracer accumulation identified to suggest metastatic disease. Expected urinary tract and soft tissue distribution of tracer. IMPRESSION: No scintigraphic evidence of osseous metastatic disease. Electronically Signed   By: MLavonia DanaM.D.   On: 07/11/2016 14:49  Ct Abdomen Pelvis W Contrast  Result Date: 07/11/2016 CLINICAL DATA:  55 year old female with newly diagnosed right breast cancer. Staging. EXAM: CT ABDOMEN AND PELVIS WITH CONTRAST TECHNIQUE: Multidetector CT imaging of the abdomen and pelvis was performed using the standard protocol following bolus administration of intravenous contrast. CONTRAST:  61m ISOVUE-300 IOPAMIDOL (ISOVUE-300) INJECTION 61% COMPARISON:  None. FINDINGS: Lower chest: Solitary 4 mm solid right lower lobe pulmonary nodule (series 4/image 12). Hepatobiliary: Normal liver size. There are at least 4 tiny hypodense liver lesions scattered throughout the liver, largest 0.4 cm in the right liver lobe (series 602/image 68). Normal gallbladder with no radiopaque cholelithiasis. No biliary ductal dilatation. Pancreas: Normal, with no mass or duct dilation. Spleen: Normal size. No mass. Adrenals/Urinary Tract: Normal adrenals. Normal kidneys with no hydronephrosis and no renal mass. Normal bladder. Stomach/Bowel: Grossly normal stomach.  Normal caliber small bowel with no small bowel wall thickening. Normal appendix. Normal large bowel with no diverticulosis, large bowel wall thickening or pericolonic fat stranding. Vascular/Lymphatic: Normal caliber abdominal aorta. Patent portal, splenic, hepatic and renal veins. No pathologically enlarged lymph nodes in the abdomen or pelvis. Reproductive: Grossly normal uterus.  No adnexal mass. Other: No pneumoperitoneum, ascites or focal fluid collection. Musculoskeletal: No aggressive appearing focal osseous lesions. Subcentimeter sclerotic lesion in the left anterior acetabulum is nonspecific and most likely a benign bone island. Mild thoracolumbar spondylosis. IMPRESSION: 1. Solitary 4 mm right lower lobe solid pulmonary nodule, indeterminate. Consider a dedicated chest CT for complete chest staging. Otherwise, a follow-up chest CT is advised in 3 months. 2. No definite metastatic disease in the abdomen or pelvis. 3. Tiny low-attenuation liver lesions measuring up to 0.4 cm, too small to characterize. A follow-up MRI abdomen without and with IV contrast or CT abdomen with IV contrast is recommended in 6 months. This recommendation follows ACR consensus guidelines: Managing Incidental Findings on Abdominal CT: White Paper of the ACR Incidental Findings Committee. J Am Coll Radiol 2010;7:754-773. Electronically Signed   By: JIlona SorrelM.D.   On: 07/11/2016 14:04   Dg Chest Port 1 View  Result Date: 07/11/2016 CLINICAL DATA:  Port-A-Cath insertion. EXAM: PORTABLE CHEST 1 VIEW COMPARISON:  04/17/2015 FINDINGS: Left anterior chest wall Port-A-Cath extends through the left subclavian vein. Tip lies in the mid superior vena cava. No pneumothorax. Lungs are clear.  Heart, mediastinum and hila are unremarkable. IMPRESSION: 1. Port-A-Cath tip projects in the mid superior vena cava. No pneumothorax. 2. No acute cardiopulmonary disease. Electronically Signed   By: DLajean ManesM.D.   On: 07/11/2016 17:30   Dg  C-arm 1-60 Min-no Report  Result Date: 07/11/2016 CLINICAL DATA: surgery C-ARM 1-60 MINUTES Fluoroscopy was utilized by the requesting physician.  No radiographic interpretation.    Time Spent in minutes  30   JJani GravelM.D on 07/26/2016 at 8:27 AM  Between 7am to 7pm - Pager - [phone] After 7pm go to [email or web address] - password [email or web address] [facility]umber[facility] LP [facility] -  Office  [phone]

## 2016-07-27 ENCOUNTER — Ambulatory Visit (HOSPITAL_COMMUNITY): Admission: RE | Admit: 2016-07-27 | Payer: Self-pay | Source: Ambulatory Visit

## 2016-07-27 DIAGNOSIS — L0390 Cellulitis, unspecified: Secondary | ICD-10-CM

## 2016-07-27 DIAGNOSIS — L0291 Cutaneous abscess, unspecified: Secondary | ICD-10-CM

## 2016-07-27 LAB — COMPREHENSIVE METABOLIC PANEL
ALT: 24 U/L (ref 14–54)
ANION GAP: 6 (ref 5–15)
AST: 15 U/L (ref 15–41)
Albumin: 3.2 g/dL — ABNORMAL LOW (ref 3.5–5.0)
Alkaline Phosphatase: 78 U/L (ref 38–126)
BUN: 6 mg/dL (ref 6–20)
CALCIUM: 8.6 mg/dL — AB (ref 8.9–10.3)
CHLORIDE: 102 mmol/L (ref 101–111)
CO2: 25 mmol/L (ref 22–32)
Creatinine, Ser: 0.63 mg/dL (ref 0.44–1.00)
Glucose, Bld: 115 mg/dL — ABNORMAL HIGH (ref 65–99)
Potassium: 3.4 mmol/L — ABNORMAL LOW (ref 3.5–5.1)
SODIUM: 133 mmol/L — AB (ref 135–145)
Total Bilirubin: 0.6 mg/dL (ref 0.3–1.2)
Total Protein: 6.7 g/dL (ref 6.5–8.1)

## 2016-07-27 LAB — CBC
HCT: 35.6 % — ABNORMAL LOW (ref 36.0–46.0)
Hemoglobin: 12.3 g/dL (ref 12.0–15.0)
MCH: 31.9 pg (ref 26.0–34.0)
MCHC: 34.6 g/dL (ref 30.0–36.0)
MCV: 92.5 fL (ref 78.0–100.0)
PLATELETS: 107 10*3/uL — AB (ref 150–400)
RBC: 3.85 MIL/uL — ABNORMAL LOW (ref 3.87–5.11)
RDW: 13.2 % (ref 11.5–15.5)
WBC: 1.2 10*3/uL — CL (ref 4.0–10.5)

## 2016-07-27 LAB — URINE CULTURE

## 2016-07-27 MED ORDER — MAGIC MOUTHWASH W/LIDOCAINE: 15.0000 mL | Freq: Four times a day (QID) | ORAL | 1 refills | Status: DC | PRN

## 2016-07-27 MED ORDER — HEPARIN SOD (PORK) LOCK FLUSH 100 UNIT/ML IV SOLN: 500.0000 [IU] | INTRAVENOUS | Status: DC

## 2016-07-27 MED ORDER — POTASSIUM CHLORIDE CRYS ER 20 MEQ PO TBCR
40.0000 meq | EXTENDED_RELEASE_TABLET | Freq: Once | ORAL | Status: AC
  Administered 2016-07-27: 40 meq via ORAL
  Filled 2016-07-27: qty 2

## 2016-07-27 MED ORDER — HEPARIN SOD (PORK) LOCK FLUSH 100 UNIT/ML IV SOLN
500.0000 [IU] | INTRAVENOUS | Status: DC | PRN
  Filled 2016-07-27: qty 5

## 2016-07-27 MED ORDER — CETYLPYRIDINIUM CHLORIDE 0.05 % MT LIQD: 7.0000 mL | Freq: Two times a day (BID) | OROMUCOSAL | 2 refills | Status: DC

## 2016-07-27 MED ORDER — ALTEPLASE 2 MG IJ SOLR
2.0000 mg | Freq: Once | INTRAMUSCULAR | Status: AC
  Administered 2016-07-27: 2 mg
  Filled 2016-07-27: qty 2

## 2016-07-27 MED ORDER — CHLORHEXIDINE GLUCONATE 0.12 % MT SOLN
15.0000 mL | Freq: Two times a day (BID) | OROMUCOSAL | 0 refills | Status: DC
Start: 2016-07-27 — End: 2016-12-13

## 2016-07-27 MED ORDER — DOXYCYCLINE HYCLATE 100 MG PO CAPS
100.0000 mg | ORAL_CAPSULE | Freq: Two times a day (BID) | ORAL | 0 refills | Status: DC
Start: 2016-07-27 — End: 2016-08-10

## 2016-07-27 NOTE — Discharge Instructions (Signed)
Neutropenia Neutropenia is a condition that occurs when the level of a certain type of white blood cell (neutrophil) in your body becomes lower than normal. Neutrophils are made in the bone marrow and fight infections. These cells protect against bacteria and viruses. The fewer neutrophils you have, and the longer your body remains without them, the greater your risk of getting a severe infection becomes. CAUSES  The cause of neutropenia may be hard to determine. However, it is usually due to 3 main problems:   Decreased production of neutrophils. This may be due to:  Certain medicines such as chemotherapy.  Genetic problems.  Cancer.  Radiation treatments.  Vitamin deficiency.  Some pesticides.  Increased destruction of neutrophils. This may be due to:  Overwhelming infections.  Hemolytic anemia. This is when the body destroys its own blood cells.  Chemotherapy.  Neutrophils moving to areas of the body where they cannot fight infections. This may be due to:  Dialysis procedures.  Conditions where the spleen becomes enlarged. Neutrophils are held in the spleen and are not available to the rest of the body.  Overwhelming infections. The neutrophils are held in the area of the infection and are not available to the rest of the body. SYMPTOMS  There are no specific symptoms of neutropenia. The lack of neutrophils can result in an infection, and an infection can cause various problems. DIAGNOSIS  Diagnosis is made by a blood test. A complete blood count is performed. The normal level of neutrophils in human blood differs with age and race. Infants have lower counts than older children and adults. African Americans have lower counts than Caucasians or Asians. The average adult level is 1500 cells/mm3 of blood. Neutrophil counts are interpreted as follows:  Greater than 1000 cells/mm3 gives normal protection against infection.  500 to 1000 cells/mm3 gives an increased risk for  infection.  200 to 500 cells/mm3 is a greater risk for severe infection.  Lower than 200 cells/mm3 is a marked risk of infection. This may require hospitalization and treatment with antibiotic medicines. TREATMENT  Treatment depends on the underlying cause, severity, and presence of infections or symptoms. It also depends on your health. Your caregiver will discuss the treatment plan with you. Mild cases are often easily treated and have a good outcome. Preventative measures may also be started to limit your risk of infections. Treatment can include:  Taking antibiotics.  Stopping medicines that are known to cause neutropenia.  Correcting nutritional deficiencies by eating green vegetables to supply folic acid and taking vitamin B supplements.  Stopping exposure to pesticides if your neutropenia is related to pesticide exposure.  Taking a blood growth factor called sargramostim, pegfilgrastim, or filgrastim if you are undergoing chemotherapy for cancer. This stimulates white blood cell production.  Removal of the spleen if you have Felty's syndrome and have repeated infections. HOME CARE INSTRUCTIONS   Follow your caregiver's instructions about when you need to have blood work done.  Wash your hands often. Make sure others who come in contact with you also wash their hands.  Wash raw fruits and vegetables before eating them. They can carry bacteria and fungi.  Avoid people with colds or spreadable (contagious) diseases (chickenpox, herpes zoster, influenza).  Avoid large crowds.  Avoid construction areas. The dust can release fungus into the air.  Be cautious around children in daycare or school environments.  Take care of your respiratory system by coughing and deep breathing.  Bathe daily.  Protect your skin from cuts and   burns.  Do not work in the garden or with flowers and plants.  Care for the mouth before and after meals by brushing with a soft toothbrush. If you have  mucositis, do not use mouthwash. Mouthwash contains alcohol and can dry out the mouth even more.  Clean the area between the genitals and the anus (perineal area) after urination and bowel movements. Women need to wipe from front to back.  Use a water soluble lubricant during sexual intercourse and practice good hygiene after. Do not have intercourse if you are severely neutropenic. Check with your caregiver for guidelines.  Exercise daily as tolerated.  Avoid people who were vaccinated with a live vaccine in the past 30 days. You should not receive live vaccines (polio, typhoid).  Do not provide direct care for pets. Avoid animal droppings. Do not clean litter boxes and bird cages.  Do not share food utensils.  Do not use tampons, enemas, or rectal suppositories unless directed by your caregiver.  Use an electric razor to remove hair.  Wash your hands after handling magazines, letters, and newspapers. SEEK IMMEDIATE MEDICAL CARE IF:   You have a fever.  You have chills or start to shake.  You feel nauseous or vomit.  You develop mouth sores.  You develop aches and pains.  You have redness and swelling around open wounds.  Your skin is warm to the touch.  You have pus coming from your wounds.  You develop swollen lymph nodes.  You feel weak or fatigued.  You develop red streaks on the skin. MAKE SURE YOU:  Understand these instructions.  Will watch your condition.  Will get help right away if you are not doing well or get worse.   This information is not intended to replace advice given to you by your health care provider. Make sure you discuss any questions you have with your health care provider.   Document Released: 05/12/2002 Document Revised: 02/12/2012 Document Reviewed: 06/02/2015 [employer] Interactive Patient Education 2016 [employer]  

## 2016-07-27 NOTE — Discharge Summary (Signed)
Physician Discharge Summary  April [name] [id number] DOB: 1961/11/14 DOA: 07/25/2016  PCP: [name], MD  Admit date: 07/25/2016 Discharge date: 07/27/2016  Admitted From: Home Discharge disposition: Home   Recommendations for Outpatient Follow-Up:   1. Please repeat a CBC with differential at appointment on 07/31/16. 2. Please follow-up final blood cultures and throat culture results.   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  Diet recommendation:   Regular.  History of Present Illness:   April [name] is an 55 y.o. female with a PMH of right-sided breast cancer status post recent chemotherapy followed by Neulasta on 07/18/16 who was admitted 07/25/16 with neutropenic fever.  Hospital Course by Problem:   Principal Problem:   Febrile neutropenia (HCC) secondary to chemotherapy/cellulitis and abscesses of the buttocks Follow-up blood and urine cultures. Rapid strep screen negative. Follow-up throat cultures. Initially treated with empiric cefepime/vancomycin. Patient defervesced and source of her infection felt to be from multiple small buttock abscesses. She will be discharged on 6 additional days of treatment with doxycycline. Instructed to return to the ER for recurrent problems with fever. The patient appeared well and was nontoxic appearing at discharge.  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 She is under the care of Dr. Jana Hakim, who she will follow-up with on 07/31/16.    Thrombocytopenia ([place]) secondary to chemotherapy Platelet count stable.    History of herpes Continue acyclovir.    Hypokalemia Repleted.    Mucositis Continue Magic mouthwash.   Medical Consultants:    None.   Discharge Exam:   Vitals:   07/26/16 2208 07/27/16 0605  BP: 125/84 116/80  Pulse: 81 91  Resp: 20 20  Temp: 99.5 F (37.5 C) 98.4 F (36.9 C)   Vitals:   07/26/16 1039 07/26/16 1527 07/26/16 2208 07/27/16 0605  BP:  130/82 125/84 116/80  Pulse:  93 81 91  Resp:  20 20 20   Temp: 100 F (37.8 C) 99.3 F (37.4 C) 99.5 F (37.5 C) 98.4 F (36.9 C)  TempSrc:  Oral Oral Oral  SpO2:  97% 100% 91%  Weight:      Height:        General exam: Appears calm and comfortable.  Respiratory system: Clear to auscultation. Respiratory effort normal. Cardiovascular system: S1 & S2 heard, RRR. No JVD,  rubs, gallops or clicks. No murmurs. Gastrointestinal system: Abdomen is nondistended, soft and nontender. No organomegaly or masses felt. Normal bowel sounds heard. Central nervous system: Alert and oriented. No focal neurological deficits. Extremities: No clubbing,  or cyanosis. No edema. Skin: Multiple small boils/abscesses to the buttocks with some surrounding erythema. Port-A-Cath site without signs of infection. Psychiatry: Judgement and insight appear normal. Mood & affect appropriate.    The results of significant diagnostics from this hospitalization (including imaging, microbiology, ancillary and laboratory) are listed below for reference.     Procedures  and Diagnostic Studies:   Ct Head Wo Contrast  Result Date: 07/25/2016 CLINICAL DATA:  With patient with elevated temperature. Migraine headache. History a noted at the Breast Cancer. EXAM: CT HEAD WITHOUT CONTRAST TECHNIQUE: Contiguous axial images were obtained from the base of  the skull through the vertex without intravenous contrast. COMPARISON:  None. FINDINGS: Brain: Ventricles and sulci are appropriate for patient's age. No evidence for acute cortically based infarct, intracranial hemorrhage, mass lesion or mass-effect. Vascular: Unremarkable Skull: Intact. Sinuses/Orbits: Mastoid air cells unremarkable. Paranasal sinuses unremarkable. Other: None. IMPRESSION: No acute intracranial process. Electronically Signed   By: Lovey Newcomer M.D.   On: 07/25/2016 20:51     Labs:   Basic Metabolic Panel:  Recent Labs Lab 07/24/16 1224  07/25/16 1920 07/26/16 0500 07/27/16 0632  NA 135*  --  132* 134* 133*  K 3.9  < > 3.6 3.7 3.4*  CL  --   --  102 103 102  CO2 24  --  22 23 25   GLUCOSE 93  --  106* 109* 115*  BUN 10.2  --  12 7 6   CREATININE 0.7  --  0.63 0.61 0.63  CALCIUM 9.5  --  8.7* 8.5* 8.6*  < > = values in this interval not displayed. GFR Estimated Creatinine Clearance: 93.9 mL/min (by C-G formula based on SCr of 0.8 mg/dL). Liver Function Tests:  Recent Labs Lab 07/24/16 1224 07/27/16 0632  AST 9 15  ALT 13 24  ALKPHOS 107 78  BILITOT 0.67 0.6  PROT 7.1 6.7  ALBUMIN 3.3* 3.2*   CBC:  Recent Labs Lab 07/24/16 1224 07/25/16 1920 07/26/16 0500 07/27/16 0632  WBC 0.3* 0.1* 0.5* 1.2*  NEUTROABS 0.0*  --   --   --   HGB 13.8 12.6 12.0 12.3  HCT 41.7 36.2 34.6* 35.6*  MCV 94.8 92.6 91.5 92.5  PLT 91* 75* 76* 107*   Microbiology Recent Results (from the past 240 hour(s))  Culture, Blood     Status: None (Preliminary result)   Collection Time: 07/25/16  4:36 PM  Result Value Ref Range Status   BLOOD CULTURE, ROUTINE Preliminary report  Preliminary   RESULT 1 Comment  Preliminary    Comment: No growth in 36 - 48 hours.  Urine Culture     Status: None   Collection Time: 07/25/16  4:43 PM  Result Value Ref Range Status   Urine Culture, Routine Final report  Final   Urine Culture result 1 Comment  Final    Comment: Mixed urogenital  flora Less than 10,000 colonies/mL   Culture, Blood     Status: None (Preliminary result)   Collection Time: 07/25/16  4:50 PM  Result Value Ref Range Status   BLOOD CULTURE, ROUTINE Preliminary report  Preliminary   RESULT 1 Comment  Preliminary    Comment: No growth in 36 - 48 hours.  Rapid strep screen     Status: None   Collection Time: 07/25/16  7:25 PM  Result Value Ref Range Status   Streptococcus, Group A Screen (Direct) NEGATIVE NEGATIVE Final    Comment: (NOTE) A Rapid Antigen test may result negative if the antigen level in the sample is below the detection level of this test. The FDA has not cleared this test as a stand-alone test therefore the rapid antigen negative result has reflexed to a Group A Strep culture.   Culture, group A strep     Status: None (Preliminary result)   Collection Time: 07/25/16  7:25 PM  Result Value Ref Range Status   Specimen Description THROAT  Final   Special Requests NONE Reflexed from [phone]  Final   Culture   Final    CULTURE REINCUBATED FOR BETTER GROWTH Performed at [facility]    Report Status PENDING  Incomplete  Culture, Urine     Status: Abnormal   Collection Time: 07/25/16 10:06 PM  Result Value Ref Range Status   Specimen Description URINE, CLEAN CATCH  Final   Special Requests NONE  Final   Culture (A)  Final    <10,000 COLONIES/mL INSIGNIFICANT GROWTH Performed at [facility]    Report Status 07/27/2016 FINAL  Final     Discharge Instructions:   Discharge Instructions    Call MD for:  extreme fatigue    Complete by:  As directed   Call MD for:  persistant nausea and vomiting    Complete by:  As directed   Call MD for:  redness, tenderness, or signs of infection (pain, swelling, redness, odor or green/yellow discharge around incision site)    Complete by:  As directed   Call MD for:  temperature >100.4    Complete by:  As directed   Diet general    Complete by:  As directed   Increase activity  slowly    Complete by:  As directed       Medication List    TAKE these medications   acyclovir 400 MG tablet Commonly known as:  ZOVIRAX 1 tab by mouth twice daily   antiseptic oral rinse 0.05 % Liqd solution Commonly known as:  CPC / CETYLPYRIDINIUM CHLORIDE 0.05% 7 mLs by Mouth Rinse route 2 times daily at 12 noon and 4 pm.   chlorhexidine 0.12 % solution Commonly known as:  PERIDEX 15 mLs by Mouth Rinse route 2 (two) times daily.   desloratadine 5 MG tablet Commonly known as:  CLARINEX Take 1 tablet (5 mg total) by mouth daily. What changed:  when to take this   dexamethasone 4 MG tablet Commonly known as:  DECADRON Take 2 tablets by mouth once a day on the day after chemotherapy and then take 2 tablets two times a day for 2 days. Take with food.   doxycycline 100 MG capsule Commonly known as:  VIBRAMYCIN Take 1 capsule (100 mg total) by mouth 2 (two) times daily.   DULoxetine 30 MG capsule Commonly known as:  CYMBALTA 1 cap by mouth with 60  mg cap once daily for total of 90 mg daily What changed:  how much to take  how to take this  when to take this  additional instructions   DULoxetine 60 MG capsule Commonly known as:  CYMBALTA Take 1 capsule (60 mg total) by mouth daily. Take along with 30mg  capsule for a total of 90mg  of cymbalta per day. What changed:  Another medication with the same name was changed. Make sure you understand how and when to take each.   ibuprofen 800 MG tablet Commonly known as:  ADVIL,MOTRIN Take 1 tablet (800 mg total) by mouth every 8 (eight) hours as needed.   lidocaine-prilocaine cream Commonly known as:  EMLA Apply 1 application topically as needed.   LORazepam 0.5 MG tablet Commonly known as:  ATIVAN Take 1 tablet (0.5 mg total) by mouth at bedtime as needed (Nausea or vomiting).   magic mouthwash w/lidocaine Soln Take 15 mLs by mouth 4 (four) times daily as needed for mouth pain.   mometasone  50 MCG/ACT nasal  spray Commonly known as:  NASONEX 2 sprays each nostril daily   omeprazole 40 MG capsule Commonly known as:  PRILOSEC Take 1 capsule (40 mg total) by mouth daily.   prochlorperazine 10 MG tablet Commonly known as:  COMPAZINE Take 1 tablet (10 mg total) by mouth every 6 (six) hours as needed (Nausea or vomiting).   SUMAtriptan 50 MG tablet Commonly known as:  IMITREX Take 1 tablet (50 mg total) by mouth every 2 (two) hours as needed for migraine. May repeat in 2 hours if headache persists or recurs.      Follow-up Information    Chauncey Cruel, MD Follow up on 07/31/2016.   Specialty:  Oncology Contact information: [place] [address] [phone]            Time coordinating discharge: 35 minutes.  Signed:  RAMA,CHRISTINA  Pager [phone] [facility] 07/27/2016, 3:49 PM

## 2016-07-27 NOTE — Progress Notes (Signed)
Pt discharged home with spouse in stable condition. Discharge instructions and scripts given. Pt verbalized understanding. No immediate questions/concerns at this time.

## 2016-07-28 ENCOUNTER — Other Ambulatory Visit: Payer: Self-pay

## 2016-07-28 ENCOUNTER — Telehealth: Payer: Self-pay | Admitting: *Deleted

## 2016-07-28 DIAGNOSIS — C50411 Malignant neoplasm of upper-outer quadrant of right female breast: Secondary | ICD-10-CM

## 2016-07-28 LAB — CULTURE, GROUP A STREP (THRC)

## 2016-07-28 NOTE — Telephone Encounter (Signed)
Message retrieved per VM from pt stating she has not been able to pick up prescriptions per discharge from the hospital due to " I do not have any money and cannot pay for them and the [facility] pharmacy cannot fill unless they are written by Dr Jana Hakim."  This RN attempted to returned call to pt per given number on message of [phone] -which went to a VM for " Dee" - general message left for return call. Attempted per other numbers on face page both with VM-message left on these verified numbers that prescriptions are available for pick up at the [facility] pharmacy per their contact to this office for prescribing MD authorization.  This RN then called emergency contact - Sydney -noted as pt's dtr and per person to person contact informed her prescriptions are available but need for pick up prior to 6 pm due to when pharmacy closes - as well as the pharmacy is closed. Lorena verbalized understanding and apologized that her mother did not answer her phone.

## 2016-07-30 NOTE — Addendum Note (Signed)
Encounter addended by: Jolaine Artist, MD on: 07/30/2016 11:59 PM<BR>    Actions taken: LOS modified, Sign clinical note

## 2016-07-31 ENCOUNTER — Ambulatory Visit (HOSPITAL_BASED_OUTPATIENT_CLINIC_OR_DEPARTMENT_OTHER): Payer: Medicaid Other

## 2016-07-31 ENCOUNTER — Encounter: Payer: Self-pay | Admitting: *Deleted

## 2016-07-31 ENCOUNTER — Ambulatory Visit (HOSPITAL_BASED_OUTPATIENT_CLINIC_OR_DEPARTMENT_OTHER): Payer: Medicaid Other | Admitting: Oncology

## 2016-07-31 ENCOUNTER — Telehealth: Payer: Self-pay | Admitting: Oncology

## 2016-07-31 ENCOUNTER — Other Ambulatory Visit (HOSPITAL_BASED_OUTPATIENT_CLINIC_OR_DEPARTMENT_OTHER): Payer: Medicaid Other

## 2016-07-31 ENCOUNTER — Ambulatory Visit: Payer: No Typology Code available for payment source

## 2016-07-31 DIAGNOSIS — C50411 Malignant neoplasm of upper-outer quadrant of right female breast: Secondary | ICD-10-CM

## 2016-07-31 DIAGNOSIS — Z5189 Encounter for other specified aftercare: Secondary | ICD-10-CM | POA: Diagnosis not present

## 2016-07-31 DIAGNOSIS — Z452 Encounter for adjustment and management of vascular access device: Secondary | ICD-10-CM

## 2016-07-31 DIAGNOSIS — C773 Secondary and unspecified malignant neoplasm of axilla and upper limb lymph nodes: Secondary | ICD-10-CM

## 2016-07-31 DIAGNOSIS — Z5111 Encounter for antineoplastic chemotherapy: Secondary | ICD-10-CM

## 2016-07-31 DIAGNOSIS — C50911 Malignant neoplasm of unspecified site of right female breast: Secondary | ICD-10-CM

## 2016-07-31 LAB — COMPREHENSIVE METABOLIC PANEL
ALT: 26 U/L (ref 0–55)
ANION GAP: 10 meq/L (ref 3–11)
AST: 17 U/L (ref 5–34)
Albumin: 3.1 g/dL — ABNORMAL LOW (ref 3.5–5.0)
Alkaline Phosphatase: 105 U/L (ref 40–150)
BUN: 7.2 mg/dL (ref 7.0–26.0)
CALCIUM: 9.2 mg/dL (ref 8.4–10.4)
CHLORIDE: 105 meq/L (ref 98–109)
CO2: 23 mEq/L (ref 22–29)
Creatinine: 0.8 mg/dL (ref 0.6–1.1)
EGFR: >90 mL/min/{1.73_m2} (ref >90)
Glucose: 97 mg/dl (ref 70–140)
POTASSIUM: 3.9 meq/L (ref 3.5–5.1)
Sodium: 138 mEq/L (ref 136–145)
Total Bilirubin: <0.3 mg/dL (ref 0.20–1.20)
Total Protein: 6.7 g/dL (ref 6.4–8.3)

## 2016-07-31 LAB — CBC WITH DIFFERENTIAL/PLATELET
BASO%: 0.4 % (ref 0.0–2.0)
BASOS ABS: 0 10*3/uL (ref 0.0–0.1)
EOS%: 0.2 % (ref 0.0–7.0)
Eosinophils Absolute: 0 10*3/uL (ref 0.0–0.5)
HEMATOCRIT: 34.7 % — AB (ref 34.8–46.6)
HGB: 12 g/dL (ref 11.6–15.9)
LYMPH%: 16.1 % (ref 14.0–49.7)
MCH: 31.9 pg (ref 25.1–34.0)
MCHC: 34.6 g/dL (ref 31.5–36.0)
MCV: 92.3 fL (ref 79.5–101.0)
MONO#: 0.7 10*3/uL (ref 0.1–0.9)
MONO%: 14.1 % — ABNORMAL HIGH (ref 0.0–14.0)
NEUT#: 3.4 10*3/uL (ref 1.5–6.5)
NEUT%: 69.2 % (ref 38.4–76.8)
PLATELETS: 300 10*3/uL (ref 145–400)
RBC: 3.76 10*6/uL (ref 3.70–5.45)
RDW: 13 % (ref 11.2–14.5)
WBC: 4.9 10*3/uL (ref 3.9–10.3)
lymph#: 0.8 10*3/uL — ABNORMAL LOW (ref 0.9–3.3)

## 2016-07-31 LAB — CULTURE, BLOOD (SINGLE)

## 2016-07-31 MED ORDER — HEPARIN SOD (PORK) LOCK FLUSH 100 UNIT/ML IV SOLN
500.0000 [IU] | Freq: Once | INTRAVENOUS | Status: AC | PRN
  Administered 2016-07-31: 500 [IU]
  Filled 2016-07-31: qty 5

## 2016-07-31 MED ORDER — SODIUM CHLORIDE 0.9 % IV SOLN
Freq: Once | INTRAVENOUS | Status: AC
  Administered 2016-07-31: 12:00:00 via INTRAVENOUS
  Filled 2016-07-31: qty 5

## 2016-07-31 MED ORDER — PALONOSETRON HCL INJECTION 0.25 MG/5ML
0.2500 mg | Freq: Once | INTRAVENOUS | Status: AC
  Administered 2016-07-31: 0.25 mg via INTRAVENOUS

## 2016-07-31 MED ORDER — PALONOSETRON HCL INJECTION 0.25 MG/5ML
INTRAVENOUS | Status: AC
Start: 2016-07-31 — End: 2016-07-31
  Filled 2016-07-31: qty 5

## 2016-07-31 MED ORDER — SODIUM CHLORIDE 0.9 % IV SOLN
520.0000 mg/m2 | Freq: Once | INTRAVENOUS | Status: AC
  Administered 2016-07-31: 1120 mg via INTRAVENOUS
  Filled 2016-07-31: qty 56

## 2016-07-31 MED ORDER — PEGFILGRASTIM 6 MG/0.6ML ~~LOC~~ PSKT
6.0000 mg | PREFILLED_SYRINGE | Freq: Once | SUBCUTANEOUS | Status: AC
  Administered 2016-07-31: 6 mg via SUBCUTANEOUS
  Filled 2016-07-31: qty 0.6

## 2016-07-31 MED ORDER — SODIUM CHLORIDE 0.9% FLUSH
10.0000 mL | INTRAVENOUS | Status: DC | PRN
  Administered 2016-07-31: 10 mL
  Filled 2016-07-31: qty 10

## 2016-07-31 MED ORDER — ALTEPLASE 2 MG IJ SOLR
2.0000 mg | Freq: Once | INTRAMUSCULAR | Status: AC | PRN
  Administered 2016-07-31: 2 mg
  Filled 2016-07-31: qty 2

## 2016-07-31 MED ORDER — DOXORUBICIN HCL CHEMO IV INJECTION 2 MG/ML
52.0000 mg/m2 | Freq: Once | INTRAVENOUS | Status: AC
  Administered 2016-07-31: 112 mg via INTRAVENOUS
  Filled 2016-07-31: qty 56

## 2016-07-31 MED ORDER — SODIUM CHLORIDE 0.9 % IV SOLN
Freq: Once | INTRAVENOUS | Status: AC
  Administered 2016-07-31: 12:00:00 via INTRAVENOUS

## 2016-07-31 NOTE — Telephone Encounter (Signed)
Unable to schedule 9/1 appt per LOS due to pt has a court date. GM aware and will schedule follow up at later date
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
## 2016-07-31 NOTE — Progress Notes (Signed)
[facility] Clinical Social Worker  Holiday representative met with patient in the infusion room to discuss transportation.  Patient was taking the bus to her appointments, but has found that to be more difficult since she has started treatment.  CSW and patient reviewed and completed the SCAT application.  CSW will submit application and SCAT will contact patient once she is approved.  CSW encouraged patient to call with questions or concerns.     Johnnye Lana, MSW, LCSW, OSW-C Clinical Social Worker [facility] [phone]

## 2016-07-31 NOTE — Patient Instructions (Signed)
[facility] Discharge Instructions for Patients Receiving Chemotherapy  Today you received the following chemotherapy agents Adriamycin and Cytoxan   To help prevent nausea and vomiting after your treatment, we encourage you to take your nausea medication as directed. No Zofran for 3 days. Take Compazine instead.    If you develop nausea and vomiting that is not controlled by your nausea medication, call the clinic.   BELOW ARE SYMPTOMS THAT SHOULD BE REPORTED IMMEDIATELY:  *FEVER GREATER THAN 100.5 F  *CHILLS WITH OR WITHOUT FEVER  NAUSEA AND VOMITING THAT IS NOT CONTROLLED WITH YOUR NAUSEA MEDICATION  *UNUSUAL SHORTNESS OF BREATH  *UNUSUAL BRUISING OR BLEEDING  TENDERNESS IN MOUTH AND THROAT WITH OR WITHOUT PRESENCE OF ULCERS  *URINARY PROBLEMS  *BOWEL PROBLEMS  UNUSUAL RASH Items with * indicate a potential emergency and should be followed up as soon as possible.  Feel free to call the clinic you have any questions or concerns. The clinic phone number is [phone].  Please show the CHEMO ALERT CARD at check-in to the Emergency Department and triage nurse.   

## 2016-07-31 NOTE — Progress Notes (Signed)
At 1150 checking on Altepase indwelling and blood return was noted as brisk and sufficient. 32ml of blood drawn and wasted and line flushed with NS.

## 2016-08-03 ENCOUNTER — Other Ambulatory Visit: Payer: Self-pay | Admitting: Oncology

## 2016-08-03 DIAGNOSIS — C773 Secondary and unspecified malignant neoplasm of axilla and upper limb lymph nodes: Principal | ICD-10-CM

## 2016-08-03 DIAGNOSIS — C50919 Malignant neoplasm of unspecified site of unspecified female breast: Secondary | ICD-10-CM

## 2016-08-07 ENCOUNTER — Inpatient Hospital Stay (HOSPITAL_COMMUNITY)
Admission: EM | Admit: 2016-08-07 | Discharge: 2016-08-10 | DRG: 872 | Disposition: A | Discharge status: Home / Self Care | Payer: Medicaid Other | Attending: Internal Medicine | Admitting: Internal Medicine

## 2016-08-07 ENCOUNTER — Other Ambulatory Visit: Payer: Self-pay

## 2016-08-07 ENCOUNTER — Encounter (HOSPITAL_COMMUNITY): Payer: Self-pay | Admitting: Emergency Medicine

## 2016-08-07 ENCOUNTER — Other Ambulatory Visit (HOSPITAL_COMMUNITY): Payer: Self-pay

## 2016-08-07 ENCOUNTER — Emergency Department (HOSPITAL_COMMUNITY): Payer: Medicaid Other

## 2016-08-07 DIAGNOSIS — Z885 Allergy status to narcotic agent status: Secondary | ICD-10-CM

## 2016-08-07 DIAGNOSIS — D709 Neutropenia, unspecified: Secondary | ICD-10-CM

## 2016-08-07 DIAGNOSIS — E876 Hypokalemia: Secondary | ICD-10-CM | POA: Diagnosis not present

## 2016-08-07 DIAGNOSIS — M109 Gout, unspecified: Secondary | ICD-10-CM | POA: Diagnosis present

## 2016-08-07 DIAGNOSIS — R030 Elevated blood-pressure reading, without diagnosis of hypertension: Secondary | ICD-10-CM

## 2016-08-07 DIAGNOSIS — E871 Hypo-osmolality and hyponatremia: Secondary | ICD-10-CM

## 2016-08-07 DIAGNOSIS — C773 Secondary and unspecified malignant neoplasm of axilla and upper limb lymph nodes: Secondary | ICD-10-CM | POA: Diagnosis present

## 2016-08-07 DIAGNOSIS — T451X5A Adverse effect of antineoplastic and immunosuppressive drugs, initial encounter: Secondary | ICD-10-CM | POA: Diagnosis present

## 2016-08-07 DIAGNOSIS — Z170 Estrogen receptor positive status [ER+]: Secondary | ICD-10-CM | POA: Diagnosis not present

## 2016-08-07 DIAGNOSIS — T148XXA Other injury of unspecified body region, initial encounter: Secondary | ICD-10-CM

## 2016-08-07 DIAGNOSIS — B961 Klebsiella pneumoniae [K. pneumoniae] as the cause of diseases classified elsewhere: Secondary | ICD-10-CM | POA: Diagnosis present

## 2016-08-07 DIAGNOSIS — R7881 Bacteremia: Secondary | ICD-10-CM | POA: Diagnosis not present

## 2016-08-07 DIAGNOSIS — G43909 Migraine, unspecified, not intractable, without status migrainosus: Secondary | ICD-10-CM | POA: Diagnosis present

## 2016-08-07 DIAGNOSIS — L0291 Cutaneous abscess, unspecified: Secondary | ICD-10-CM

## 2016-08-07 DIAGNOSIS — Z79899 Other long term (current) drug therapy: Secondary | ICD-10-CM

## 2016-08-07 DIAGNOSIS — Z87891 Personal history of nicotine dependence: Secondary | ICD-10-CM | POA: Diagnosis not present

## 2016-08-07 DIAGNOSIS — L03114 Cellulitis of left upper limb: Secondary | ICD-10-CM | POA: Diagnosis present

## 2016-08-07 DIAGNOSIS — F32A Depression, unspecified: Secondary | ICD-10-CM

## 2016-08-07 DIAGNOSIS — M170 Bilateral primary osteoarthritis of knee: Secondary | ICD-10-CM | POA: Diagnosis present

## 2016-08-07 DIAGNOSIS — K219 Gastro-esophageal reflux disease without esophagitis: Secondary | ICD-10-CM | POA: Diagnosis present

## 2016-08-07 DIAGNOSIS — Z818 Family history of other mental and behavioral disorders: Secondary | ICD-10-CM

## 2016-08-07 DIAGNOSIS — R21 Rash and other nonspecific skin eruption: Secondary | ICD-10-CM

## 2016-08-07 DIAGNOSIS — K1231 Oral mucositis (ulcerative) due to antineoplastic therapy: Secondary | ICD-10-CM | POA: Diagnosis not present

## 2016-08-07 DIAGNOSIS — R5081 Fever presenting with conditions classified elsewhere: Secondary | ICD-10-CM | POA: Diagnosis present

## 2016-08-07 DIAGNOSIS — C50411 Malignant neoplasm of upper-outer quadrant of right female breast: Secondary | ICD-10-CM

## 2016-08-07 DIAGNOSIS — J302 Other seasonal allergic rhinitis: Secondary | ICD-10-CM | POA: Diagnosis present

## 2016-08-07 DIAGNOSIS — D61818 Other pancytopenia: Secondary | ICD-10-CM | POA: Diagnosis not present

## 2016-08-07 DIAGNOSIS — A6000 Herpesviral infection of urogenital system, unspecified: Secondary | ICD-10-CM

## 2016-08-07 DIAGNOSIS — M797 Fibromyalgia: Secondary | ICD-10-CM | POA: Diagnosis present

## 2016-08-07 DIAGNOSIS — L98491 Non-pressure chronic ulcer of skin of other sites limited to breakdown of skin: Secondary | ICD-10-CM

## 2016-08-07 DIAGNOSIS — N951 Menopausal and female climacteric states: Secondary | ICD-10-CM

## 2016-08-07 DIAGNOSIS — A419 Sepsis, unspecified organism: Secondary | ICD-10-CM | POA: Diagnosis not present

## 2016-08-07 DIAGNOSIS — J3089 Other allergic rhinitis: Secondary | ICD-10-CM

## 2016-08-07 DIAGNOSIS — F419 Anxiety disorder, unspecified: Secondary | ICD-10-CM | POA: Diagnosis not present

## 2016-08-07 DIAGNOSIS — L0231 Cutaneous abscess of buttock: Secondary | ICD-10-CM | POA: Diagnosis present

## 2016-08-07 DIAGNOSIS — Z8249 Family history of ischemic heart disease and other diseases of the circulatory system: Secondary | ICD-10-CM

## 2016-08-07 DIAGNOSIS — D696 Thrombocytopenia, unspecified: Secondary | ICD-10-CM

## 2016-08-07 DIAGNOSIS — R509 Fever, unspecified: Secondary | ICD-10-CM | POA: Diagnosis not present

## 2016-08-07 DIAGNOSIS — C50919 Malignant neoplasm of unspecified site of unspecified female breast: Secondary | ICD-10-CM | POA: Diagnosis not present

## 2016-08-07 DIAGNOSIS — L988 Other specified disorders of the skin and subcutaneous tissue: Secondary | ICD-10-CM | POA: Diagnosis not present

## 2016-08-07 DIAGNOSIS — Z825 Family history of asthma and other chronic lower respiratory diseases: Secondary | ICD-10-CM

## 2016-08-07 DIAGNOSIS — Z888 Allergy status to other drugs, medicaments and biological substances status: Secondary | ICD-10-CM

## 2016-08-07 DIAGNOSIS — L03317 Cellulitis of buttock: Secondary | ICD-10-CM | POA: Diagnosis present

## 2016-08-07 DIAGNOSIS — Z95828 Presence of other vascular implants and grafts: Secondary | ICD-10-CM | POA: Diagnosis not present

## 2016-08-07 DIAGNOSIS — F329 Major depressive disorder, single episode, unspecified: Secondary | ICD-10-CM | POA: Diagnosis present

## 2016-08-07 DIAGNOSIS — Z8261 Family history of arthritis: Secondary | ICD-10-CM

## 2016-08-07 DIAGNOSIS — E860 Dehydration: Secondary | ICD-10-CM

## 2016-08-07 DIAGNOSIS — Z8052 Family history of malignant neoplasm of bladder: Secondary | ICD-10-CM

## 2016-08-07 DIAGNOSIS — L0390 Cellulitis, unspecified: Secondary | ICD-10-CM

## 2016-08-07 DIAGNOSIS — D61810 Antineoplastic chemotherapy induced pancytopenia: Secondary | ICD-10-CM | POA: Diagnosis not present

## 2016-08-07 LAB — COMPREHENSIVE METABOLIC PANEL
ALK PHOS: 80 U/L (ref 38–126)
ALT: 15 U/L (ref 14–54)
ANION GAP: 10 (ref 5–15)
AST: 9 U/L — ABNORMAL LOW (ref 15–41)
Albumin: 3.6 g/dL (ref 3.5–5.0)
BILIRUBIN TOTAL: 0.6 mg/dL (ref 0.3–1.2)
BUN: 9 mg/dL (ref 6–20)
CALCIUM: 9 mg/dL (ref 8.9–10.3)
CO2: 20 mmol/L — ABNORMAL LOW (ref 22–32)
Chloride: 100 mmol/L — ABNORMAL LOW (ref 101–111)
Creatinine, Ser: 0.61 mg/dL (ref 0.44–1.00)
GFR calc non Af Amer: >60 mL/min (ref >60)
Glucose, Bld: 109 mg/dL — ABNORMAL HIGH (ref 65–99)
POTASSIUM: 3.6 mmol/L (ref 3.5–5.1)
SODIUM: 130 mmol/L — AB (ref 135–145)
TOTAL PROTEIN: 7.1 g/dL (ref 6.5–8.1)

## 2016-08-07 LAB — CBC WITH DIFFERENTIAL/PLATELET
BASOS ABS: 0 10*3/uL (ref 0.0–0.1)
Basophils Relative: 7 %
EOS ABS: 0 10*3/uL (ref 0.0–0.7)
Eosinophils Relative: 0 %
HCT: 33.8 % — ABNORMAL LOW (ref 36.0–46.0)
Hemoglobin: 11.9 g/dL — ABNORMAL LOW (ref 12.0–15.0)
LYMPHS PCT: 73 %
Lymphs Abs: 0.2 10*3/uL — ABNORMAL LOW (ref 0.7–4.0)
MCH: 31.2 pg (ref 26.0–34.0)
MCHC: 35.2 g/dL (ref 30.0–36.0)
MCV: 88.7 fL (ref 78.0–100.0)
MONOS PCT: 13 %
Monocytes Absolute: 0 10*3/uL — ABNORMAL LOW (ref 0.1–1.0)
NEUTROS PCT: 7 %
Neutro Abs: 0 10*3/uL — ABNORMAL LOW (ref 1.7–7.7)
PLATELETS: 140 10*3/uL — AB (ref 150–400)
RBC: 3.81 MIL/uL — AB (ref 3.87–5.11)
RDW: 13 % (ref 11.5–15.5)
WBC: 0.2 10*3/uL — AB (ref 4.0–10.5)

## 2016-08-07 LAB — URINALYSIS, ROUTINE W REFLEX MICROSCOPIC
Bilirubin Urine: NEGATIVE
Glucose, UA: NEGATIVE mg/dL
Hgb urine dipstick: NEGATIVE
KETONES UR: NEGATIVE mg/dL
LEUKOCYTES UA: NEGATIVE
NITRITE: NEGATIVE
PROTEIN: NEGATIVE mg/dL
Specific Gravity, Urine: 1.013 (ref 1.005–1.030)
pH: 8.5 — ABNORMAL HIGH (ref 5.0–8.0)

## 2016-08-07 LAB — I-STAT CG4 LACTIC ACID, ED: LACTIC ACID, VENOUS: 1.07 mmol/L (ref 0.5–1.9)

## 2016-08-07 LAB — MAGNESIUM: Magnesium: 1.8 mg/dL (ref 1.7–2.4)

## 2016-08-07 MED ORDER — VANCOMYCIN HCL IN DEXTROSE 1-5 GM/200ML-% IV SOLN
1000.0000 mg | Freq: Once | INTRAVENOUS | Status: AC
  Administered 2016-08-07: 1000 mg via INTRAVENOUS
  Filled 2016-08-07: qty 200

## 2016-08-07 MED ORDER — SODIUM CHLORIDE 0.9% FLUSH
3.0000 mL | Freq: Two times a day (BID) | INTRAVENOUS | Status: DC
  Administered 2016-08-09: 3 mL via INTRAVENOUS

## 2016-08-07 MED ORDER — LORATADINE 10 MG PO TABS
10.0000 mg | ORAL_TABLET | Freq: Every day | ORAL | Status: DC
  Administered 2016-08-07 – 2016-08-10 (×4): 10 mg via ORAL
  Filled 2016-08-07 (×4): qty 1

## 2016-08-07 MED ORDER — CETYLPYRIDINIUM CHLORIDE 0.05 % MT LIQD: 7.0000 mL | Freq: Two times a day (BID) | OROMUCOSAL | Status: DC

## 2016-08-07 MED ORDER — SUMATRIPTAN SUCCINATE 50 MG PO TABS
50.0000 mg | ORAL_TABLET | ORAL | Status: DC | PRN
  Administered 2016-08-07 – 2016-08-09 (×2): 50 mg via ORAL
  Filled 2016-08-07 (×6): qty 1

## 2016-08-07 MED ORDER — PROCHLORPERAZINE MALEATE 10 MG PO TABS: 10.0000 mg | ORAL_TABLET | Freq: Four times a day (QID) | ORAL | Status: DC | PRN

## 2016-08-07 MED ORDER — ACYCLOVIR 400 MG PO TABS
400.0000 mg | ORAL_TABLET | Freq: Two times a day (BID) | ORAL | Status: DC
  Administered 2016-08-07 – 2016-08-10 (×6): 400 mg via ORAL
  Filled 2016-08-07 (×7): qty 1

## 2016-08-07 MED ORDER — MAGIC MOUTHWASH W/LIDOCAINE
15.0000 mL | Freq: Four times a day (QID) | ORAL | Status: DC | PRN
  Filled 2016-08-07: qty 15

## 2016-08-07 MED ORDER — POTASSIUM CHLORIDE IN NACL 20-0.9 MEQ/L-% IV SOLN
INTRAVENOUS | Status: AC
  Administered 2016-08-07 – 2016-08-09 (×3): via INTRAVENOUS
  Filled 2016-08-07 (×4): qty 1000

## 2016-08-07 MED ORDER — SODIUM CHLORIDE 0.9 % IV BOLUS (SEPSIS)
500.0000 mL | Freq: Once | INTRAVENOUS | Status: AC
  Administered 2016-08-07: 500 mL via INTRAVENOUS

## 2016-08-07 MED ORDER — PIPERACILLIN-TAZOBACTAM 3.375 G IVPB 30 MIN
3.3750 g | Freq: Once | INTRAVENOUS | Status: AC
  Administered 2016-08-07: 3.375 g via INTRAVENOUS
  Filled 2016-08-07: qty 50

## 2016-08-07 MED ORDER — ONDANSETRON HCL 4 MG PO TABS: 4.0000 mg | ORAL_TABLET | Freq: Four times a day (QID) | ORAL | Status: DC | PRN

## 2016-08-07 MED ORDER — ENOXAPARIN SODIUM 60 MG/0.6ML ~~LOC~~ SOLN
50.0000 mg | SUBCUTANEOUS | Status: DC
  Administered 2016-08-07 – 2016-08-09 (×3): 50 mg via SUBCUTANEOUS
  Filled 2016-08-07 (×3): qty 0.6

## 2016-08-07 MED ORDER — SODIUM CHLORIDE 0.9 % IV BOLUS (SEPSIS)
1000.0000 mL | Freq: Once | INTRAVENOUS | Status: AC
  Administered 2016-08-07: 1000 mL via INTRAVENOUS

## 2016-08-07 MED ORDER — IBUPROFEN 800 MG PO TABS
800.0000 mg | ORAL_TABLET | Freq: Once | ORAL | Status: AC
  Administered 2016-08-07: 800 mg via ORAL
  Filled 2016-08-07: qty 1

## 2016-08-07 MED ORDER — DULOXETINE HCL 30 MG PO CPEP: 60.0000 mg | ORAL_CAPSULE | Freq: Every day | ORAL | Status: DC

## 2016-08-07 MED ORDER — ONDANSETRON HCL 4 MG/2ML IJ SOLN
4.0000 mg | Freq: Four times a day (QID) | INTRAMUSCULAR | Status: DC | PRN
  Administered 2016-08-07: 4 mg via INTRAVENOUS
  Filled 2016-08-07: qty 2

## 2016-08-07 MED ORDER — DULOXETINE HCL 30 MG PO CPEP
90.0000 mg | ORAL_CAPSULE | Freq: Every day | ORAL | Status: DC
  Administered 2016-08-08 – 2016-08-10 (×3): 90 mg via ORAL
  Filled 2016-08-07 (×3): qty 3

## 2016-08-07 MED ORDER — PIPERACILLIN-TAZOBACTAM 3.375 G IVPB
3.3750 g | Freq: Three times a day (TID) | INTRAVENOUS | Status: DC
Start: 2016-08-08 — End: 2016-08-08
  Administered 2016-08-08 (×2): 3.375 g via INTRAVENOUS
  Filled 2016-08-07 (×2): qty 50

## 2016-08-07 MED ORDER — VANCOMYCIN HCL IN DEXTROSE 1-5 GM/200ML-% IV SOLN
1000.0000 mg | Freq: Two times a day (BID) | INTRAVENOUS | Status: DC
  Administered 2016-08-08 – 2016-08-10 (×5): 1000 mg via INTRAVENOUS
  Filled 2016-08-07 (×5): qty 200

## 2016-08-07 MED ORDER — LORAZEPAM 0.5 MG PO TABS
0.5000 mg | ORAL_TABLET | Freq: Every evening | ORAL | Status: DC | PRN
  Administered 2016-08-09 (×2): 0.5 mg via ORAL
  Filled 2016-08-07 (×2): qty 1

## 2016-08-07 MED ORDER — CHLORHEXIDINE GLUCONATE 0.12 % MT SOLN: 15.0000 mL | Freq: Two times a day (BID) | OROMUCOSAL | Status: DC

## 2016-08-07 MED ORDER — PANTOPRAZOLE SODIUM 40 MG PO TBEC
40.0000 mg | DELAYED_RELEASE_TABLET | Freq: Every day | ORAL | Status: DC
  Administered 2016-08-08 – 2016-08-10 (×3): 40 mg via ORAL
  Filled 2016-08-07 (×3): qty 1

## 2016-08-07 MED ORDER — FLUTICASONE PROPIONATE 50 MCG/ACT NA SUSP
1.0000 | Freq: Every day | NASAL | Status: DC
  Administered 2016-08-07 – 2016-08-10 (×3): 1 via NASAL
  Filled 2016-08-07: qty 16

## 2016-08-07 MED ORDER — ORAL CARE MOUTH RINSE
15.0000 mL | Freq: Two times a day (BID) | OROMUCOSAL | Status: DC
  Administered 2016-08-07 – 2016-08-10 (×5): 15 mL via OROMUCOSAL
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
## 2016-08-07 NOTE — Progress Notes (Signed)
Pharmacy Antibiotic Note  April [name] is a 55 y.o. female admitted on 08/07/2016 with sepsis.  Pharmacy has been consulted for vancomycin and Zosyn dosing.  Plan: Vancomycin 2g loading dose x 1 then 1g IV every 12 hours.  Goal trough 15-20 mcg/mL.  Zosyn 3.375g IV Q8H infused over 4hrs. Measure Vanc trough at steady state. Follow up renal fxn, culture results, and clinical course.   Height: 5\' 4"  (162.6 cm) Weight: 225 lb (102.1 kg) IBW/kg (Calculated) : 54.7  Temp (24hrs), Avg:99.8 F (37.7 C), Min:99.8 F (37.7 C), Max:99.8 F (37.7 C)   Recent Labs Lab 08/07/16 1720 08/07/16 1739  WBC 0.2*  --   CREATININE 0.61  --   LATICACIDVEN  --  1.07    Estimated Creatinine Clearance: 93.5 mL/min (by C-G formula based on SCr of 0.8 mg/dL).    Allergies  Allergen Reactions  . Hydrocodone Nausea Only and Other (See Comments)    dizziness  . Ultram [Tramadol Hcl] Nausea Only  . Gabapentin Rash    Antimicrobials this admission: Zosyn 9/4 >>  Vanc 9/4 >>   Dose adjustments this admission:  Microbiology results: BCx: UCx:  Sputum: MRSA PCR:  Thank you for allowing pharmacy to be a part of this patient's care.  Romeo Rabon, PharmD, pager [phone]. 08/07/2016,6:35 PM.
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
## 2016-08-07 NOTE — ED Notes (Signed)
Dr Ortiz in with pt 

## 2016-08-07 NOTE — ED Notes (Signed)
Pt informed of room assignment, 1520

## 2016-08-07 NOTE — ED Notes (Signed)
Attempted to call report.  Informed receiving RN in a room, will have her call back.

## 2016-08-07 NOTE — ED Triage Notes (Addendum)
Pt presents to the ED with complaints of fever, headache, and generalized all over body pain. Pt states she has ulcers in her mouth, on her feet, and on her bottom. She is currently a cancer patient within system. She has a port on left chest. Current temp is 99.8. Previous temp at home was 102. Currently rating pain 10/10.

## 2016-08-08 DIAGNOSIS — D709 Neutropenia, unspecified: Secondary | ICD-10-CM

## 2016-08-08 DIAGNOSIS — C50411 Malignant neoplasm of upper-outer quadrant of right female breast: Secondary | ICD-10-CM

## 2016-08-08 DIAGNOSIS — C773 Secondary and unspecified malignant neoplasm of axilla and upper limb lymph nodes: Secondary | ICD-10-CM

## 2016-08-08 DIAGNOSIS — R5081 Fever presenting with conditions classified elsewhere: Secondary | ICD-10-CM

## 2016-08-08 LAB — COMPREHENSIVE METABOLIC PANEL
ALT: 12 U/L — AB (ref 14–54)
ANION GAP: 12 (ref 5–15)
AST: 9 U/L — ABNORMAL LOW (ref 15–41)
Albumin: 2.7 g/dL — ABNORMAL LOW (ref 3.5–5.0)
Alkaline Phosphatase: 61 U/L (ref 38–126)
BUN: 6 mg/dL (ref 6–20)
CHLORIDE: 104 mmol/L (ref 101–111)
CO2: 20 mmol/L — ABNORMAL LOW (ref 22–32)
CREATININE: 0.69 mg/dL (ref 0.44–1.00)
Calcium: 7.4 mg/dL — ABNORMAL LOW (ref 8.9–10.3)
Glucose, Bld: 127 mg/dL — ABNORMAL HIGH (ref 65–99)
Potassium: 2.9 mmol/L — ABNORMAL LOW (ref 3.5–5.1)
SODIUM: 136 mmol/L (ref 135–145)
Total Bilirubin: 0.5 mg/dL (ref 0.3–1.2)
Total Protein: 5.5 g/dL — ABNORMAL LOW (ref 6.5–8.1)

## 2016-08-08 LAB — MRSA PCR SCREENING: MRSA BY PCR: NEGATIVE

## 2016-08-08 LAB — CBC WITH DIFFERENTIAL/PLATELET
Basophils Absolute: 0 10*3/uL (ref 0.0–0.1)
Basophils Relative: 9 %
Eosinophils Absolute: 0 10*3/uL (ref 0.0–0.7)
Eosinophils Relative: 0 %
HCT: 26.6 % — ABNORMAL LOW (ref 36.0–46.0)
Hemoglobin: 9.2 g/dL — ABNORMAL LOW (ref 12.0–15.0)
Lymphocytes Relative: 27 %
Lymphs Abs: 0 10*3/uL — ABNORMAL LOW (ref 0.7–4.0)
MCH: 31.9 pg (ref 26.0–34.0)
MCHC: 34.6 g/dL (ref 30.0–36.0)
MCV: 92.4 fL (ref 78.0–100.0)
Monocytes Absolute: 0.1 10*3/uL (ref 0.1–1.0)
Monocytes Relative: 55 %
Neutro Abs: 0 10*3/uL — ABNORMAL LOW (ref 1.7–7.7)
Neutrophils Relative %: 9 %
Platelets: 108 10*3/uL — ABNORMAL LOW (ref 150–400)
RBC: 2.88 MIL/uL — ABNORMAL LOW (ref 3.87–5.11)
RDW: 13.3 % (ref 11.5–15.5)
WBC: 0.1 10*3/uL — CL (ref 4.0–10.5)

## 2016-08-08 LAB — BLOOD CULTURE ID PANEL (REFLEXED)
ACINETOBACTER BAUMANNII: NOT DETECTED
CANDIDA ALBICANS: NOT DETECTED
CANDIDA GLABRATA: NOT DETECTED
CANDIDA TROPICALIS: NOT DETECTED
Candida krusei: NOT DETECTED
Candida parapsilosis: NOT DETECTED
Carbapenem resistance: NOT DETECTED
ENTEROBACTER CLOACAE COMPLEX: NOT DETECTED
ENTEROBACTERIACEAE SPECIES: DETECTED — AB
ENTEROCOCCUS SPECIES: NOT DETECTED
ESCHERICHIA COLI: NOT DETECTED
HAEMOPHILUS INFLUENZAE: NOT DETECTED
Klebsiella oxytoca: NOT DETECTED
Klebsiella pneumoniae: DETECTED — AB
LISTERIA MONOCYTOGENES: NOT DETECTED
NEISSERIA MENINGITIDIS: NOT DETECTED
PROTEUS SPECIES: NOT DETECTED
PSEUDOMONAS AERUGINOSA: NOT DETECTED
STREPTOCOCCUS AGALACTIAE: NOT DETECTED
STREPTOCOCCUS PNEUMONIAE: NOT DETECTED
STREPTOCOCCUS SPECIES: NOT DETECTED
Serratia marcescens: NOT DETECTED
Staphylococcus aureus (BCID): NOT DETECTED
Staphylococcus species: NOT DETECTED
Streptococcus pyogenes: NOT DETECTED

## 2016-08-08 LAB — OSMOLALITY, URINE: Osmolality, Ur: 479 mOsm/kg (ref 300–900)

## 2016-08-08 LAB — OSMOLALITY: OSMOLALITY: 266 mosm/kg — AB (ref 275–295)

## 2016-08-08 LAB — SODIUM, URINE, RANDOM: Sodium, Ur: 145 mmol/L

## 2016-08-08 MED ORDER — DEXTROSE 5 % IV SOLN
2.0000 g | Freq: Three times a day (TID) | INTRAVENOUS | Status: DC
  Administered 2016-08-08 – 2016-08-10 (×7): 2 g via INTRAVENOUS
  Filled 2016-08-08 (×7): qty 2

## 2016-08-08 MED ORDER — SODIUM CHLORIDE 0.9% FLUSH
10.0000 mL | INTRAVENOUS | Status: DC | PRN
Start: 2016-08-08 — End: 2016-08-10
  Administered 2016-08-09 – 2016-08-10 (×3): 10 mL
  Filled 2016-08-08 (×3): qty 40

## 2016-08-08 MED ORDER — PNEUMOCOCCAL VAC POLYVALENT 25 MCG/0.5ML IJ INJ
0.5000 mL | INJECTION | INTRAMUSCULAR | Status: DC
  Filled 2016-08-08 (×3): qty 0.5

## 2016-08-08 MED ORDER — POLYETHYLENE GLYCOL 3350 17 G PO PACK
17.0000 g | PACK | Freq: Every day | ORAL | Status: DC
  Administered 2016-08-08 – 2016-08-10 (×3): 17 g via ORAL
  Filled 2016-08-08 (×4): qty 1

## 2016-08-08 MED ORDER — IBUPROFEN 800 MG PO TABS
800.0000 mg | ORAL_TABLET | Freq: Three times a day (TID) | ORAL | Status: DC
  Administered 2016-08-08 – 2016-08-09 (×4): 800 mg via ORAL
  Filled 2016-08-08 (×4): qty 1

## 2016-08-08 MED ORDER — ACETAMINOPHEN 325 MG PO TABS
325.0000 mg | ORAL_TABLET | Freq: Four times a day (QID) | ORAL | Status: DC | PRN
  Administered 2016-08-08 – 2016-08-09 (×2): 325 mg via ORAL
  Filled 2016-08-08 (×2): qty 1

## 2016-08-08 MED ORDER — BUTALBITAL-APAP-CAFFEINE 50-325-40 MG PO TABS
2.0000 | ORAL_TABLET | Freq: Once | ORAL | Status: AC
  Administered 2016-08-08: 2 via ORAL
  Filled 2016-08-08: qty 2

## 2016-08-08 MED ORDER — OXYCODONE HCL 5 MG PO TABS
5.0000 mg | ORAL_TABLET | ORAL | Status: DC | PRN
  Administered 2016-08-08 – 2016-08-09 (×4): 5 mg via ORAL
  Filled 2016-08-08 (×4): qty 1

## 2016-08-08 NOTE — Progress Notes (Addendum)
PHARMACY - PHYSICIAN COMMUNICATION CRITICAL VALUE ALERT - BLOOD CULTURE IDENTIFICATION (BCID)  Results for orders placed or performed during the hospital encounter of 08/07/16  Blood Culture ID Panel (Reflexed) (Collected: 08/07/2016  7:05 PM)  Result Value Ref Range   Enterococcus species NOT DETECTED NOT DETECTED   Listeria monocytogenes NOT DETECTED NOT DETECTED   Staphylococcus species NOT DETECTED NOT DETECTED   Staphylococcus aureus NOT DETECTED NOT DETECTED   Streptococcus species NOT DETECTED NOT DETECTED   Streptococcus agalactiae NOT DETECTED NOT DETECTED   Streptococcus pneumoniae NOT DETECTED NOT DETECTED   Streptococcus pyogenes NOT DETECTED NOT DETECTED   Acinetobacter baumannii NOT DETECTED NOT DETECTED   Enterobacteriaceae species DETECTED (A) NOT DETECTED   Enterobacter cloacae complex NOT DETECTED NOT DETECTED   Escherichia coli NOT DETECTED NOT DETECTED   Klebsiella oxytoca NOT DETECTED NOT DETECTED   Klebsiella pneumoniae DETECTED (A) NOT DETECTED   Proteus species NOT DETECTED NOT DETECTED   Serratia marcescens NOT DETECTED NOT DETECTED   Carbapenem resistance NOT DETECTED NOT DETECTED   Haemophilus influenzae NOT DETECTED NOT DETECTED   Neisseria meningitidis NOT DETECTED NOT DETECTED   Pseudomonas aeruginosa NOT DETECTED NOT DETECTED   Candida albicans NOT DETECTED NOT DETECTED   Candida glabrata NOT DETECTED NOT DETECTED   Candida krusei NOT DETECTED NOT DETECTED   Candida parapsilosis NOT DETECTED NOT DETECTED   Candida tropicalis NOT DETECTED NOT DETECTED    Name of physician (or Provider) Contacted: Dr. Wendee Beavers  Changes to prescribed antibiotics: Algorithm suggests Ceftriaxone. However, since patient still neutropenic and with fevers, changing Zosyn to Cefepime per MD. Will dose Cefepime at 2g IV q8h for febrile neutropenia. MD wishes to continue Vancomycin at this time MRSA PCR has been sent as well.   Luiz Ochoa 08/08/2016  12:56 PM

## 2016-08-08 NOTE — Progress Notes (Signed)
PROGRESS NOTE    April [name]  [id number] DOB: 07-27-1961 DOA: 08/07/2016 PCP: [name], MD    Brief Narrative:   55 y.o. female with medical history significant of allergy, anxiety, osteoarthritis of knees, bilateral ankle fractures, right wrist fracture, right breast cancer with metastases to axillary nodes, anxiety, depression, fibromyalgia, migraine headaches, GERD who comes emergency department with complaints of fever, headache, myalgias and arthralgias since yesterday.  Patient admitted with neutropenic fever. Oncology assisting.   Assessment & Plan:   Principal Problem:   Neutropenic fever ([place]) - secondary to bacteremia. Source unknown - For now continue cefepime and vancomycin  Bacteremia - growing klebsiella pneumoniae  - d/c pharmacist and placed on cefepime  Active Problems:   Depression (emotion) - continue cymbalta    Anxiety - continue cymbalta and ativan prn    Fibromyalgia - stable continue prn pain medication    Herpes genitalis - continue acyclovir    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) - oncology managing    Mucositis due to chemotherapy     DVT prophylaxis: Lovenox Code Status: Full Family Communication: None at bedside Disposition Plan: pending improvement in condition   Consultants:   Oncology   Procedures: None   Antimicrobials: cefepime and vancomycin   Subjective: Pt has no new complaints. No acute issues overnight.  Objective: Vitals:   08/08/16 0856 08/08/16 1148 08/08/16 1400 08/08/16 1427  BP: (!) 141/74   111/60  Pulse: (!) 101   91  Resp: 18   14  Temp: (!) 101.2 F (38.4 C) (!) 101.1 F (38.4 C) 98.4 F (36.9 C) 99.1 F (37.3 C)  TempSrc: Oral  Oral Oral  SpO2: 100%   97%  Weight:      Height:        Intake/Output Summary (Last 24 hours) at 08/08/16 1545 Last data filed at 08/08/16 1015  Gross per 24 hour  Intake             3240 ml  Output              350 ml    Net             2890 ml   Filed Weights   08/07/16 1646  Weight: 102.1 kg (225 lb)    Examination:  General exam: Appears calm and comfortable, in nad.   Respiratory system: Clear to auscultation. Respiratory effort normal. Cardiovascular system: S1 & S2 heard, RRR. No JVD, murmurs, rubs, Gastrointestinal system: Abdomen is nondistended, soft and nontender. No organomegaly or masses felt. Normal bowel sounds heard.  Central nervous system: Alert and oriented. No focal neurological deficits. Extremities: Symmetric 5 x 5 power. Skin: No rashes, lesions or ulcers, on limited exam. Psychiatry: Judgement and insight appear normal. Mood & affect appropriate.   Data Reviewed: I have personally reviewed following labs and imaging studies  CBC:  Recent Labs Lab 08/07/16 1720 08/08/16 0530  WBC 0.2* 0.1*  NEUTROABS 0.0* 0.0*  HGB 11.9* 9.2*  HCT 33.8* 26.6*  MCV 88.7 92.4  PLT 140* 123XX123*   Basic Metabolic Panel:  Recent Labs Lab 08/07/16 1720 08/07/16 1725 08/08/16 0530  NA 130*  --  136  K 3.6  --  2.9*  CL 100*  --  104  CO2 20*  --  20*  GLUCOSE 109*  --  127*  BUN 9  --  6  CREATININE 0.61  --  0.69  CALCIUM 9.0  --  7.4*  MG  --  1.8  --    GFR: Estimated Creatinine Clearance: 93.5 mL/min (by C-G formula based on SCr of 0.8 mg/dL). Liver Function Tests:  Recent Labs Lab 08/07/16 1720 08/08/16 0530  AST 9* 9*  ALT 15 12*  ALKPHOS 80 61  BILITOT 0.6 0.5  PROT 7.1 5.5*  ALBUMIN 3.6 2.7*   No results for input(s): LIPASE, AMYLASE in the last 168 hours. No results for input(s): AMMONIA in the last 168 hours. Coagulation Profile: No results for input(s): INR, PROTIME in the last 168 hours. Cardiac Enzymes: No results for input(s): CKTOTAL, CKMB, CKMBINDEX, TROPONINI in the last 168 hours. BNP (last 3 results) No results for input(s): PROBNP in the last 8760 hours. HbA1C: No results for input(s): HGBA1C in the last 72 hours. CBG: No results for  input(s): GLUCAP in the last 168 hours. Lipid Profile: No results for input(s): CHOL, HDL, LDLCALC, TRIG, CHOLHDL, LDLDIRECT in the last 72 hours. Thyroid Function Tests: No results for input(s): TSH, T4TOTAL, FREET4, T3FREE, THYROIDAB in the last 72 hours. Anemia Panel: No results for input(s): VITAMINB12, FOLATE, FERRITIN, TIBC, IRON, RETICCTPCT in the last 72 hours. Sepsis Labs:  Recent Labs Lab 08/07/16 1739  LATICACIDVEN 1.07    Recent Results (from the past 240 hour(s))  Culture, blood (Routine X 2)     Status: None (Preliminary result)   Collection Time: 08/07/16  5:25 PM  Result Value Ref Range Status   Specimen Description BLOOD BLOOD LEFT ARM  Final   Special Requests NONE  Final   Culture   Final    NO GROWTH < 24 HOURS Performed at [name]    Report Status PENDING  Incomplete  Culture, blood (Routine X 2)     Status: None (Preliminary result)   Collection Time: 08/07/16  7:05 PM  Result Value Ref Range Status   Specimen Description BLOOD LEFT PORTA CATH  Final   Special Requests   Final    BOTTLES DRAWN AEROBIC AND ANAEROBIC 5CC Performed at [facility]    Culture  Setup Time   Final    GRAM NEGATIVE RODS AEROBIC BOTTLE ONLY Organism ID to follow CRITICAL RESULT CALLED TO, READ BACK BY AND VERIFIED WITH: Mila Merry.D. 11:05 08/08/16 ([email or web address])    Culture GRAM NEGATIVE RODS  Final   Report Status PENDING  Incomplete  Blood Culture ID Panel (Reflexed)     Status: Abnormal   Collection Time: 08/07/16  7:05 PM  Result Value Ref Range Status   Enterococcus species NOT DETECTED NOT DETECTED Final   Listeria monocytogenes NOT DETECTED NOT DETECTED Final   Staphylococcus species NOT DETECTED NOT DETECTED Final   Staphylococcus aureus NOT DETECTED NOT DETECTED Final   Streptococcus species NOT DETECTED NOT DETECTED Final   Streptococcus agalactiae NOT DETECTED NOT DETECTED Final   Streptococcus pneumoniae NOT DETECTED NOT DETECTED Final    Streptococcus pyogenes NOT DETECTED NOT DETECTED Final   Acinetobacter baumannii NOT DETECTED NOT DETECTED Final   Enterobacteriaceae species DETECTED (A) NOT DETECTED Final    Comment: CRITICAL RESULT CALLED TO, READ BACK BY AND VERIFIED WITH: Mila Merry.D. 11:05 08/08/16  ([email or web address])    Enterobacter cloacae complex NOT DETECTED NOT DETECTED Final   Escherichia coli NOT DETECTED NOT DETECTED Final   Klebsiella oxytoca NOT DETECTED NOT DETECTED Final   Klebsiella pneumoniae DETECTED (A) NOT DETECTED Final    Comment: CRITICAL RESULT CALLED TO, READ BACK BY AND VERIFIED WITH: Mila Merry.D. 11:05 08/08/16  ([email or web address])  Proteus species NOT DETECTED NOT DETECTED Final   Serratia marcescens NOT DETECTED NOT DETECTED Final   Carbapenem resistance NOT DETECTED NOT DETECTED Final   Haemophilus influenzae NOT DETECTED NOT DETECTED Final   Neisseria meningitidis NOT DETECTED NOT DETECTED Final   Pseudomonas aeruginosa NOT DETECTED NOT DETECTED Final   Candida albicans NOT DETECTED NOT DETECTED Final   Candida glabrata NOT DETECTED NOT DETECTED Final   Candida krusei NOT DETECTED NOT DETECTED Final   Candida parapsilosis NOT DETECTED NOT DETECTED Final   Candida tropicalis NOT DETECTED NOT DETECTED Final  MRSA PCR Screening     Status: None   Collection Time: 08/08/16 11:59 AM  Result Value Ref Range Status   MRSA by PCR NEGATIVE NEGATIVE Final    Comment:        The GeneXpert MRSA Assay (FDA approved for NASAL specimens only), is one component of a comprehensive MRSA colonization surveillance program. It is not intended to diagnose MRSA infection nor to guide or monitor treatment for MRSA infections.          Radiology Studies: Dg Chest 2 View  Result Date: 08/07/2016 CLINICAL DATA:  History of breast cancer.  Chemotherapy. EXAM: CHEST  2 VIEW COMPARISON:  July 11, 2016 FINDINGS: Stable Port-A-Cath. No pneumothorax. The heart, hila, and mediastinum are normal. No pulmonary  nodules, masses, or focal infiltrates. IMPRESSION: No active cardiopulmonary disease. Electronically Signed   By: Dorise Bullion III M.D   On: 08/07/2016 17:39        Scheduled Meds: . acyclovir  400 mg Oral BID  . ceFEPime (MAXIPIME) IV  2 g Intravenous Q8H  . DULoxetine  90 mg Oral Daily  . enoxaparin (LOVENOX) injection  50 mg Subcutaneous Q24H  . fluticasone  1 spray Each Nare Daily  . ibuprofen  800 mg Oral TID WC  . loratadine  10 mg Oral Daily  . mouth rinse  15 mL Mouth Rinse BID  . pantoprazole  40 mg Oral Daily  . [START ON 08/09/2016] pneumococcal 23 valent vaccine  0.5 mL Intramuscular Tomorrow-1000  . polyethylene glycol  17 g Oral Daily  . sodium chloride flush  3 mL Intravenous Q12H  . vancomycin  1,000 mg Intravenous Q12H   Continuous Infusions: . 0.9 % NaCl with KCl 20 mEq / L 100 mL/hr at 08/08/16 1348     LOS: 1 day   Time spent: > 35 minutes  Velvet Bathe, MD [facility] Pager [phone]  If 7PM-7AM, please contact night-coverage [email or web address] Password [email or web address] 08/08/2016, 3:45 PM

## 2016-08-08 NOTE — Progress Notes (Signed)
April [name]   DOB:1961/10/09   HX#:[id number]   AXK#:[id number]   Interval history:  April [name] had her 2d of 4 planned cycles of cyclophosphamide and doxorubicin 07/31/2016. Her chemo doses had been decreased because of febrile neutropenia after cycle 1. She did well initially but by day 6 she developed a severe headache, which persists, and then a temperature >102. She presented to the ED 08/07/2016 and was found to be febrile with an ANC of 0.0. She was admitted and started on zysyn/vanco  Subjective:  Her h/a is no better--it improved with motrin 800 mg which she received in the ED, did not respond to imitrex x 2. She vomited early this AM. Denies dizzyness or stiff neck. Denies cough, phlegm production, diarrhea or dysuria. Her mouth is "peeled" and she had skin lesions on her buttock and elsewhere. No family in room   Objective: middle aged White woman examined in bed Vitals:   08/07/16 2002 08/08/16 0607  BP:  117/66  Pulse:  94  Resp:  (!) 22  Temp: 100.1 F (37.8 C) 99 F (37.2 C)    Body mass index is 38.62 kg/m.  Intake/Output Summary (Last 24 hours) at 08/08/16 0805 Last data filed at 08/07/16 2000  Gross per 24 hour  Intake             3000 ml  Output              350 ml  Net             2650 ml     Lungs no rales or wheezes--auscultated anterolaterally  Heart regular rate and rhythm  Abdomen soft, +BS  Neuro nonfocal  Breast exam: deferred  Skin: lesions on R hand and buttocks  CBG (last 3)  No results for input(s): GLUCAP in the last 72 hours.   Labs:  Lab Results  Component Value Date   WBC 0.1 (LL) 08/08/2016   HGB 9.2 (L) 08/08/2016   HCT 26.6 (L) 08/08/2016   MCV 92.4 08/08/2016   PLT 108 (L) 08/08/2016   NEUTROABS 0.0 (L) 08/08/2016    _0 @  Urine Studies No results for input(s): UHGB, CRYS in the last 72 hours.  Invalid input(s): UACOL, UAPR, USPG, UPH, UTP, UGL, UKET, UBIL, UNIT, UROB, ULEU, UEPI, UWBC, URBC, UBAC, CAST, UCOM,  BILUA  Basic Metabolic Panel:  Recent Labs Lab 08/07/16 1720 08/07/16 1725 08/08/16 0530  NA 130*  --  136  K 3.6  --  2.9*  CL 100*  --  104  CO2 20*  --  20*  GLUCOSE 109*  --  127*  BUN 9  --  6  CREATININE 0.61  --  0.69  CALCIUM 9.0  --  7.4*  MG  --  1.8  --    GFR Estimated Creatinine Clearance: 93.5 mL/min (by C-G formula based on SCr of 0.8 mg/dL). Liver Function Tests:  Recent Labs Lab 08/07/16 1720 08/08/16 0530  AST 9* 9*  ALT 15 12*  ALKPHOS 80 61  BILITOT 0.6 0.5  PROT 7.1 5.5*  ALBUMIN 3.6 2.7*   No results for input(s): LIPASE, AMYLASE in the last 168 hours. No results for input(s): AMMONIA in the last 168 hours. Coagulation profile No results for input(s): INR, PROTIME in the last 168 hours.  CBC:  Recent Labs Lab 08/07/16 1720 08/08/16 0530  WBC 0.2* 0.1*  NEUTROABS 0.0* 0.0*  HGB 11.9* 9.2*  HCT 33.8* 26.6*  MCV 88.7 92.4  PLT  140* 108*   Cardiac Enzymes: No results for input(s): CKTOTAL, CKMB, CKMBINDEX, TROPONINI in the last 168 hours. BNP: Invalid input(s): POCBNP CBG: No results for input(s): GLUCAP in the last 168 hours. D-Dimer No results for input(s): DDIMER in the last 72 hours. Hgb A1c No results for input(s): HGBA1C in the last 72 hours. Lipid Profile No results for input(s): CHOL, HDL, LDLCALC, TRIG, CHOLHDL, LDLDIRECT in the last 72 hours. Thyroid function studies No results for input(s): TSH, T4TOTAL, T3FREE, THYROIDAB in the last 72 hours.  Invalid input(s): FREET3 Anemia work up No results for input(s): VITAMINB12, FOLATE, FERRITIN, TIBC, IRON, RETICCTPCT in the last 72 hours. Microbiology Recent Results (from the past 240 hour(s))  Culture, blood (Routine X 2)     Status: None (Preliminary result)   Collection Time: 08/07/16  5:25 PM  Result Value Ref Range Status   Specimen Description   Final    BLOOD BLOOD LEFT ARM Performed at [facility]    Special Requests NONE  Final   Culture PENDING   Incomplete   Report Status PENDING  Incomplete  Culture, blood (Routine X 2)     Status: None (Preliminary result)   Collection Time: 08/07/16  7:05 PM  Result Value Ref Range Status   Specimen Description   Final    BLOOD LEFT PORTA CATH Performed at [facility]    Special Requests NONE  Final   Culture PENDING  Incomplete   Report Status PENDING  Incomplete      Studies:  Dg Chest 2 View  Result Date: 08/07/2016 CLINICAL DATA:  History of breast cancer.  Chemotherapy. EXAM: CHEST  2 VIEW COMPARISON:  July 11, 2016 FINDINGS: Stable Port-A-Cath. No pneumothorax. The heart, hila, and mediastinum are normal. No pulmonary nodules, masses, or focal infiltrates. IMPRESSION: No active cardiopulmonary disease. Electronically Signed   By: Dorise Bullion III M.D   On: 08/07/2016 17:39    Assessment: 55 y.o. [place] woman admitted with febrile neutropenia  (0) status post right breast upper outer quadrant and right axillary lymph node biopsy 06/21/2016, both positive for a clinical T2 N2,stage IIIA  invasive ductal carcinoma, grade 3, estrogen and progesterone receptor positive, HER-2 nonamplified, with an MIB-1 between 20 and 25%   (1) neoadjuvant chemotherapy to consist of doxorubicin and cyclophosphamide in dose dense fashion 4, starting 07/17/2016, followed by weekly paclitaxel 12  (2) definitive surgery to follow chemotherapy, with consideration of targeted axillary dissection versus participation in the Alliance trial  (3) adjuvant radiation to follow surgery  (4) anti-estrogens to follow at the completion of local treatment     Plan:  Currently day 9 cycle 2 of chemo, day 2 antibiotics.  Zosyn and vanco should cover likely organisms; concern would be re possible meningitis, however she has a history of migraines and no meningeal signs.   I will start motrin TID and add OxyIr PRN with bowel prophylaxis. She already has MMW PRN and is on PPI. Expect WBC to  recover next 24-72 hours.  Greatly appreciate your hep to this patient! Will follow with you   April Cruel, MD 08/08/2016  8:05 AM Medical Oncology and Hematology [facility] [address] [place], [place] [address] Tel. [phone]    Fax. [phone]

## 2016-08-09 ENCOUNTER — Inpatient Hospital Stay (HOSPITAL_COMMUNITY): Payer: Medicaid Other

## 2016-08-09 DIAGNOSIS — C50919 Malignant neoplasm of unspecified site of unspecified female breast: Secondary | ICD-10-CM

## 2016-08-09 DIAGNOSIS — R7881 Bacteremia: Secondary | ICD-10-CM

## 2016-08-09 DIAGNOSIS — B961 Klebsiella pneumoniae [K. pneumoniae] as the cause of diseases classified elsewhere: Secondary | ICD-10-CM

## 2016-08-09 DIAGNOSIS — A419 Sepsis, unspecified organism: Principal | ICD-10-CM

## 2016-08-09 LAB — CBC WITH DIFFERENTIAL/PLATELET
BASOS ABS: 0 10*3/uL (ref 0.0–0.1)
BLASTS: 0 %
Band Neutrophils: 0 %
Basophils Absolute: 0 10*3/uL (ref 0.0–0.1)
Basophils Relative: 3 %
Basophils Relative: 0 %
EOS PCT: 0 %
Eosinophils Absolute: 0 10*3/uL (ref 0.0–0.7)
Eosinophils Absolute: 0 10*3/uL (ref 0.0–0.7)
Eosinophils Relative: 0 %
HEMATOCRIT: 27 % — AB (ref 36.0–46.0)
HEMATOCRIT: 28.3 % — AB (ref 36.0–46.0)
HEMOGLOBIN: 9.7 g/dL — AB (ref 12.0–15.0)
Hemoglobin: 9.3 g/dL — ABNORMAL LOW (ref 12.0–15.0)
LYMPHS ABS: 0.3 10*3/uL — AB (ref 0.7–4.0)
LYMPHS PCT: 33 %
Lymphocytes Relative: 18 %
Lymphs Abs: 0.3 10*3/uL — ABNORMAL LOW (ref 0.7–4.0)
MCH: 31 pg (ref 26.0–34.0)
MCH: 31.6 pg (ref 26.0–34.0)
MCHC: 34.4 g/dL (ref 30.0–36.0)
MCHC: 34.3 g/dL (ref 30.0–36.0)
MCV: 90 fL (ref 78.0–100.0)
MCV: 92.2 fL (ref 78.0–100.0)
METAMYELOCYTES PCT: 3 %
MONO ABS: 0.3 10*3/uL (ref 0.1–1.0)
MYELOCYTES: 7 %
Monocytes Absolute: 0.4 10*3/uL (ref 0.1–1.0)
Monocytes Relative: 35 %
Monocytes Relative: 27 %
NEUTROS PCT: 29 %
Neutro Abs: 0.2 10*3/uL — ABNORMAL LOW (ref 1.7–7.7)
Neutro Abs: 0.8 10*3/uL — ABNORMAL LOW (ref 1.7–7.7)
Neutrophils Relative %: 43 %
Other: 2 %
PLATELETS: 104 10*3/uL — AB (ref 150–400)
Platelets: 139 10*3/uL — ABNORMAL LOW (ref 150–400)
Promyelocytes Absolute: 0 %
RBC: 3 MIL/uL — AB (ref 3.87–5.11)
RBC: 3.07 MIL/uL — AB (ref 3.87–5.11)
RDW: 13.2 % (ref 11.5–15.5)
RDW: 13.4 % (ref 11.5–15.5)
WBC: 0.8 10*3/uL — AB (ref 4.0–10.5)
WBC: 1.5 10*3/uL — AB (ref 4.0–10.5)
nRBC: 0 /100 WBC

## 2016-08-09 LAB — COMPREHENSIVE METABOLIC PANEL
ALBUMIN: 2.8 g/dL — AB (ref 3.5–5.0)
ALT: 15 U/L (ref 14–54)
AST: 10 U/L — AB (ref 15–41)
Alkaline Phosphatase: 60 U/L (ref 38–126)
Anion gap: 5 (ref 5–15)
BILIRUBIN TOTAL: 0.4 mg/dL (ref 0.3–1.2)
BUN: 5 mg/dL — AB (ref 6–20)
CHLORIDE: 108 mmol/L (ref 101–111)
CO2: 22 mmol/L (ref 22–32)
Calcium: 8 mg/dL — ABNORMAL LOW (ref 8.9–10.3)
Creatinine, Ser: 0.46 mg/dL (ref 0.44–1.00)
GFR calc Af Amer: >60 mL/min (ref >60)
GFR calc non Af Amer: >60 mL/min (ref >60)
GLUCOSE: 103 mg/dL — AB (ref 65–99)
POTASSIUM: 3.6 mmol/L (ref 3.5–5.1)
Sodium: 135 mmol/L (ref 135–145)
Total Protein: 5.8 g/dL — ABNORMAL LOW (ref 6.5–8.1)

## 2016-08-09 LAB — URINE CULTURE

## 2016-08-09 LAB — ECHOCARDIOGRAM COMPLETE
Height: 64 in
Weight: 3600 oz

## 2016-08-09 MED ORDER — KETOROLAC TROMETHAMINE 15 MG/ML IJ SOLN
15.0000 mg | Freq: Four times a day (QID) | INTRAMUSCULAR | Status: DC | PRN
  Administered 2016-08-09: 15 mg via INTRAVENOUS
  Filled 2016-08-09: qty 1

## 2016-08-09 MED ORDER — METOCLOPRAMIDE HCL 5 MG/ML IJ SOLN
5.0000 mg | Freq: Four times a day (QID) | INTRAMUSCULAR | Status: DC | PRN
  Administered 2016-08-09: 5 mg via INTRAVENOUS
  Filled 2016-08-09: qty 2

## 2016-08-09 NOTE — Progress Notes (Signed)
  Echocardiogram 2D Echocardiogram has been performed.  Tresa Res 08/09/2016, 1:45 PM

## 2016-08-09 NOTE — Progress Notes (Addendum)
PROGRESS NOTE                                                                                                                                                                                                             Patient Demographics:    April [name], is a 55 y.o. female, DOB - Feb 23, 1961, [id number]  Admit date - 08/07/2016   Admitting Physician Reubin Milan, MD  Outpatient Primary MD for the patient is [facility], MD  LOS - 2  Outpatient Specialists: Dr Jana Hakim  Chief Complaint  Patient presents with  . Fever    Cancer  . 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 She had a fever of 1066F at home on the day of admission. In the ED she was septic with fever and severe neutropenia. Admitted for further management. Blood culture on admission growing Klebsiella and Enterobacter.    Subjective:   Patient complained of headache early this morning. Also complained of pain in her left hand with a small blister at the base of the thumb. MAXIMUM TEMPERATURE of 101.66F   Assessment  & Plan :    Principal Problem: Sepsis with Neutropenic fever ([place]) Blood culture on admission growing Klebsiella and Enterobacter. Awaiting sensitivity. Continue empiric vancomycin and cefepime. Repeat blood cultures. Check 2-D echo to rule out vegetations. If has persistent fever, bacteremia and 2-D echo concerning for vegetation, the Port-A-Cath will have to be removed.   Active Problems: Migraine headaches No Meningeal signs. Responding poorly to Imitrex. Will place on Toradol and  Reglan.     Breast cancer metastasized to axillary lymph node ([place]) On cycle 2 of chemotherapy. Dr. Jana Hakim following. Plan for definitive surgery post chemotherapy followed by adjuvant radiation and antiestrogen.    Mucositis due to chemotherapy Continue Magic mouthwash.    Cellulitis and abscess of buttocks Recent hospitalization and completed abx. Small blisters on the buttocks. does not appear to be herpetic lesions. On empiric acyclovir.  Left hand swelling ? Gouty arthritis. Check uric acid. Continue NSAIDs and monitor.  Pancytopenia  secondary to chemotherapy  Hypokalemia Replenished   Code Status : full code  Family Communication  : none at bedside  Disposition Plan  : home once improved  Barriers For Discharge : active symptoms  Consults  :   Dr Jana Hakim  Procedures  : none  DVT Prophylaxis  : SCDs  Lab Results  Component Value Date   PLT 104 (L) 08/09/2016    Antibiotics  :    Anti-infectives    Start     Dose/Rate Route Frequency Ordered Stop   08/08/16 1800  ceFEPIme (MAXIPIME) 2 g in dextrose 5 % 50 mL IVPB     2 g 100 mL/hr over 30 Minutes Intravenous Every 8 hours 08/08/16 1256     08/08/16 0800  vancomycin (VANCOCIN) IVPB 1000 mg/200 mL premix     1,000 mg 200 mL/hr over 60 Minutes Intravenous Every 12 hours 08/07/16 1842     08/08/16 0200  piperacillin-tazobactam (ZOSYN) IVPB 3.375 g  Status:  Discontinued     3.375 g 12.5 mL/hr over 240 Minutes Intravenous Every 8 hours 08/07/16 1842 08/08/16 1255   08/07/16 2200  acyclovir (ZOVIRAX) tablet 400 mg    Comments:  1 tab by mouth twice daily     400 mg Oral 2 times daily 08/07/16 2053     08/07/16 2000  vancomycin (VANCOCIN) IVPB 1000 mg/200 mL premix     1,000 mg 200 mL/hr over 60 Minutes Intravenous  Once 08/07/16 1842 08/07/16 2258   08/07/16 1830  piperacillin-tazobactam (ZOSYN) IVPB 3.375 g     3.375 g 100 mL/hr over 30 Minutes Intravenous  Once 08/07/16 1817 08/07/16 1922   08/07/16  1830  vancomycin (VANCOCIN) IVPB 1000 mg/200 mL premix     1,000 mg 200 mL/hr over 60 Minutes Intravenous  Once 08/07/16 1817 08/07/16 2008        Objective:   Vitals:   08/08/16 1427 08/08/16 2210 08/09/16 0442 08/09/16 1329  BP: 111/60 119/73 131/89 105/63  Pulse: 91 92 96 91  Resp: 14 16 18 18   Temp: 99.1 F (37.3 C) 98.7 F (37.1 C) 99.2 F (37.3 C) 97.8 F (36.6 C)  TempSrc: Oral Oral Oral Oral  SpO2: 97% 96% 96% 97%  Weight:      Height:        Wt Readings from Last 3 Encounters:  08/07/16 102.1 kg (225 lb)  07/31/16 102.2 kg (225 lb 6.4 oz)  07/26/16 102.9 kg (226 lb 13.7 oz)     Intake/Output Summary (Last 24 hours) at 08/09/16 1333 Last data filed at 08/09/16 1050  Gross per 24 hour  Intake          1643.34 ml  Output                0 ml  Net          1643.34 ml     Physical Exam  [id number] HEENT:  pallor+, moist mucosa, supple neck Chest: clear b/l, no added sounds, port A cath+ CVS: N S1&S2, no murmurs, rubs or gallop GI: soft, NT, ND, BS+, small blisters over buttocks, swollen left hand with a small superficial blister Musculoskeletal: warm, no edema CNS: alert and oriented    Data Review:    CBC  Recent Labs Lab 08/07/16 1720 08/08/16 0530 08/09/16 0450  WBC 0.2* 0.1* 0.8*  HGB 11.9* 9.2* 9.3*  HCT 33.8* 26.6* 27.0*  PLT 140* 108* 104*  MCV 88.7 92.4 90.0  MCH 31.2  31.9 31.0  MCHC 35.2 34.6 34.4  RDW 13.0 13.3 13.2  LYMPHSABS 0.2* 0.0* 0.3*  MONOABS 0.0* 0.1 0.3  EOSABS 0.0 0.0 0.0  BASOSABS 0.0 0.0 0.0    Chemistries   Recent Labs Lab 08/07/16 1720 08/07/16 1725 08/08/16 0530 08/09/16 0450  NA 130*  --  136 135  K 3.6  --  2.9* 3.6  CL 100*  --  104 108  CO2 20*  --  20* 22  GLUCOSE 109*  --  127* 103*  BUN 9  --  6 5*  CREATININE 0.61  --  0.69 0.46  CALCIUM 9.0  --  7.4* 8.0*  MG  --  1.8  --   --   AST 9*  --  9* 10*  ALT 15  --  12* 15  ALKPHOS 80  --  61 60  BILITOT 0.6  --  0.5 0.4    ------------------------------------------------------------------------------------------------------------------ No results for input(s): CHOL, HDL, LDLCALC, TRIG, CHOLHDL, LDLDIRECT in the last 72 hours.  Lab Results  Component Value Date   HGBA1C 5.4 06/23/2016   ------------------------------------------------------------------------------------------------------------------ No results for input(s): TSH, T4TOTAL, T3FREE, THYROIDAB in the last 72 hours.  Invalid input(s): FREET3 ------------------------------------------------------------------------------------------------------------------ No results for input(s): VITAMINB12, FOLATE, FERRITIN, TIBC, IRON, RETICCTPCT in the last 72 hours.  Coagulation profile No results for input(s): INR, PROTIME in the last 168 hours.  No results for input(s): DDIMER in the last 72 hours.  Cardiac Enzymes No results for input(s): CKMB, TROPONINI, MYOGLOBIN in the last 168 hours.  Invalid input(s): CK ------------------------------------------------------------------------------------------------------------------ No results found for: BNP  Inpatient Medications  Scheduled Meds: . acyclovir  400 mg Oral BID  . ceFEPime (MAXIPIME) IV  2 g Intravenous Q8H  . DULoxetine  90 mg Oral Daily  . enoxaparin (LOVENOX) injection  50 mg Subcutaneous Q24H  . fluticasone  1 spray Each Nare Daily  . ibuprofen  800 mg Oral TID WC  . loratadine  10 mg Oral Daily  . mouth rinse  15 mL Mouth Rinse BID  . pantoprazole  40 mg Oral Daily  . pneumococcal 23 valent vaccine  0.5 mL Intramuscular Tomorrow-1000  . polyethylene glycol  17 g Oral Daily  . sodium chloride flush  3 mL Intravenous Q12H  . vancomycin  1,000 mg Intravenous Q12H   Continuous Infusions:  PRN Meds:.acetaminophen, ketorolac, LORazepam, magic mouthwash w/lidocaine, metoCLOPramide (REGLAN) injection, ondansetron **OR** ondansetron (ZOFRAN) IV, oxyCODONE, prochlorperazine, sodium  chloride flush, SUMAtriptan  Micro Results Recent Results (from the past 240 hour(s))  Culture, blood (Routine X 2)     Status: None (Preliminary result)   Collection Time: 08/07/16  5:25 PM  Result Value Ref Range Status   Specimen Description BLOOD BLOOD LEFT ARM  Final   Special Requests NONE  Final   Culture   Final    NO GROWTH < 24 HOURS Performed at [facility]    Report Status PENDING  Incomplete  Culture, blood (Routine X 2)     Status: Abnormal (Preliminary result)   Collection Time: 08/07/16  7:05 PM  Result Value Ref Range Status   Specimen Description BLOOD LEFT PORTA CATH  Final   Special Requests BOTTLES DRAWN AEROBIC AND ANAEROBIC 5CC  Final   Culture  Setup Time   Final    GRAM NEGATIVE RODS AEROBIC BOTTLE ONLY Organism ID to follow CRITICAL RESULT CALLED TO, READ BACK BY AND VERIFIED WITH: Christean Grief Pharm.D. 11:05 08/08/16 ([email or web address])    Culture (A)  Final    KLEBSIELLA PNEUMONIAE SUSCEPTIBILITIES TO FOLLOW Performed at [facility]    Report Status PENDING  Incomplete  Blood Culture ID Panel (Reflexed)     Status: Abnormal   Collection Time: 08/07/16  7:05 PM  Result Value Ref Range Status   Enterococcus species NOT DETECTED NOT DETECTED Final   Listeria monocytogenes NOT DETECTED NOT DETECTED Final   Staphylococcus species NOT DETECTED NOT DETECTED Final   Staphylococcus aureus NOT DETECTED NOT DETECTED Final   Streptococcus species NOT DETECTED NOT DETECTED Final   Streptococcus agalactiae NOT DETECTED NOT DETECTED Final   Streptococcus pneumoniae NOT DETECTED NOT DETECTED Final   Streptococcus pyogenes NOT DETECTED NOT DETECTED Final   Acinetobacter baumannii NOT DETECTED NOT DETECTED Final   Enterobacteriaceae species DETECTED (A) NOT DETECTED Final    Comment: CRITICAL RESULT CALLED TO, READ BACK BY AND VERIFIED WITH: Mila Merry.D. 11:05 08/08/16  ([email or web address])    Enterobacter cloacae complex NOT DETECTED NOT DETECTED Final   Escherichia  coli NOT DETECTED NOT DETECTED Final   Klebsiella oxytoca NOT DETECTED NOT DETECTED Final   Klebsiella pneumoniae DETECTED (A) NOT DETECTED Final    Comment: CRITICAL RESULT CALLED TO, READ BACK BY AND VERIFIED WITH: Mila Merry.D. 11:05 08/08/16  ([email or web address])    Proteus species NOT DETECTED NOT DETECTED Final   Serratia marcescens NOT DETECTED NOT DETECTED Final   Carbapenem resistance NOT DETECTED NOT DETECTED Final   Haemophilus influenzae NOT DETECTED NOT DETECTED Final   Neisseria meningitidis NOT DETECTED NOT DETECTED Final   Pseudomonas aeruginosa NOT DETECTED NOT DETECTED Final   Candida albicans NOT DETECTED NOT DETECTED Final   Candida glabrata NOT DETECTED NOT DETECTED Final   Candida krusei NOT DETECTED NOT DETECTED Final   Candida parapsilosis NOT DETECTED NOT DETECTED Final   Candida tropicalis NOT DETECTED NOT DETECTED Final  Urine culture     Status: Abnormal   Collection Time: 08/07/16  7:50 PM  Result Value Ref Range Status   Specimen Description URINE, CLEAN CATCH  Final   Special Requests NONE  Final   Culture MULTIPLE SPECIES PRESENT, SUGGEST RECOLLECTION (A)  Final   Report Status 08/09/2016 FINAL  Final  MRSA PCR Screening     Status: None   Collection Time: 08/08/16 11:59 AM  Result Value Ref Range Status   MRSA by PCR NEGATIVE NEGATIVE Final    Comment:        The GeneXpert MRSA Assay (FDA approved for NASAL specimens only), is one component of a comprehensive MRSA colonization surveillance program. It is not intended to diagnose MRSA infection nor to guide or monitor treatment for MRSA infections.     Radiology Reports Dg Chest 2 View  Result Date: 08/07/2016 CLINICAL DATA:  History of breast cancer.  Chemotherapy. EXAM: CHEST  2 VIEW COMPARISON:  July 11, 2016 FINDINGS: Stable Port-A-Cath. No pneumothorax. The heart, hila, and mediastinum are normal. No pulmonary nodules, masses, or focal infiltrates. IMPRESSION: No active cardiopulmonary disease.  Electronically Signed   By: Dorise Bullion III M.D   On: 08/07/2016 17:39   Ct Head Wo Contrast  Result Date: 07/25/2016 CLINICAL DATA:  With patient with elevated temperature. Migraine headache. History a noted at the Breast Cancer. EXAM: CT HEAD WITHOUT CONTRAST TECHNIQUE: Contiguous axial images were obtained from the base of the skull through the vertex without intravenous contrast. COMPARISON:  None. FINDINGS: Brain: Ventricles and sulci are appropriate for patient's age. No evidence for acute cortically based infarct, intracranial  hemorrhage, mass lesion or mass-effect. Vascular: Unremarkable Skull: Intact. Sinuses/Orbits: Mastoid air cells unremarkable. Paranasal sinuses unremarkable. Other: None. IMPRESSION: No acute intracranial process. Electronically Signed   By: Lovey Newcomer M.D.   On: 07/25/2016 20:51   Nm Bone Scan Whole Body  Result Date: 07/11/2016 CLINICAL DATA:  RIGHT breast cancer, RIGHT shoulder pain EXAM: NUCLEAR MEDICINE WHOLE BODY BONE SCAN TECHNIQUE: Whole body anterior and posterior images were obtained approximately 3 hours after intravenous injection of radiopharmaceutical. RADIOPHARMACEUTICALS:  26.5 mCi Technetium-29m MDP IV COMPARISON:  None Radiographic correlation: CT abdomen and pelvis 07/11/2016, BILATERAL ankle radiographs 07/13/2015 FINDINGS: Minimal uptake at the shoulders, knees and feet typically degenerative. Increased uptake at lateral LEFT ankle corresponding to oblique distal fibular fracture on prior radiographs No definite additional sites of abnormal osseous tracer accumulation identified to suggest metastatic disease. Expected urinary tract and soft tissue distribution of tracer. IMPRESSION: No scintigraphic evidence of osseous metastatic disease. Electronically Signed   By: Lavonia Dana M.D.   On: 07/11/2016 14:49   Ct Abdomen Pelvis W Contrast  Result Date: 07/11/2016 CLINICAL DATA:  55 year old female with newly diagnosed right breast cancer. Staging. EXAM:  CT ABDOMEN AND PELVIS WITH CONTRAST TECHNIQUE: Multidetector CT imaging of the abdomen and pelvis was performed using the standard protocol following bolus administration of intravenous contrast. CONTRAST:  28mL ISOVUE-300 IOPAMIDOL (ISOVUE-300) INJECTION 61% COMPARISON:  None. FINDINGS: Lower chest: Solitary 4 mm solid right lower lobe pulmonary nodule (series 4/image 12). Hepatobiliary: Normal liver size. There are at least 4 tiny hypodense liver lesions scattered throughout the liver, largest 0.4 cm in the right liver lobe (series 602/image 68). Normal gallbladder with no radiopaque cholelithiasis. No biliary ductal dilatation. Pancreas: Normal, with no mass or duct dilation. Spleen: Normal size. No mass. Adrenals/Urinary Tract: Normal adrenals. Normal kidneys with no hydronephrosis and no renal mass. Normal bladder. Stomach/Bowel: Grossly normal stomach. Normal caliber small bowel with no small bowel wall thickening. Normal appendix. Normal large bowel with no diverticulosis, large bowel wall thickening or pericolonic fat stranding. Vascular/Lymphatic: Normal caliber abdominal aorta. Patent portal, splenic, hepatic and renal veins. No pathologically enlarged lymph nodes in the abdomen or pelvis. Reproductive: Grossly normal uterus.  No adnexal mass. Other: No pneumoperitoneum, ascites or focal fluid collection. Musculoskeletal: No aggressive appearing focal osseous lesions. Subcentimeter sclerotic lesion in the left anterior acetabulum is nonspecific and most likely a benign bone island. Mild thoracolumbar spondylosis. IMPRESSION: 1. Solitary 4 mm right lower lobe solid pulmonary nodule, indeterminate. Consider a dedicated chest CT for complete chest staging. Otherwise, a follow-up chest CT is advised in 3 months. 2. No definite metastatic disease in the abdomen or pelvis. 3. Tiny low-attenuation liver lesions measuring up to 0.4 cm, too small to characterize. A follow-up MRI abdomen without and with IV contrast  or CT abdomen with IV contrast is recommended in 6 months. This recommendation follows ACR consensus guidelines: Managing Incidental Findings on Abdominal CT: White Paper of the ACR Incidental Findings Committee. J Am Coll Radiol 2010;7:754-773. Electronically Signed   By: Ilona Sorrel M.D.   On: 07/11/2016 14:04   Dg Chest Port 1 View  Result Date: 07/11/2016 CLINICAL DATA:  Port-A-Cath insertion. EXAM: PORTABLE CHEST 1 VIEW COMPARISON:  04/17/2015 FINDINGS: Left anterior chest wall Port-A-Cath extends through the left subclavian vein. Tip lies in the mid superior vena cava. No pneumothorax. Lungs are clear.  Heart, mediastinum and hila are unremarkable. IMPRESSION: 1. Port-A-Cath tip projects in the mid superior vena cava. No pneumothorax. 2. No acute  cardiopulmonary disease. Electronically Signed   By: Lajean Manes M.D.   On: 07/11/2016 17:30   Dg C-arm 1-60 Min-no Report  Result Date: 07/11/2016 CLINICAL DATA: surgery C-ARM 1-60 MINUTES Fluoroscopy was utilized by the requesting physician.  No radiographic interpretation.    Time Spent in minutes  35   Louellen Molder M.D on 08/09/2016 at 1:33 PM  Between 7am to 7pm - Pager - [phone]  After 7pm go to [email or web address] - password [facility]  [facility] -  Office  [phone]

## 2016-08-10 ENCOUNTER — Other Ambulatory Visit: Payer: Self-pay | Admitting: Oncology

## 2016-08-10 ENCOUNTER — Telehealth: Payer: Self-pay

## 2016-08-10 DIAGNOSIS — L988 Other specified disorders of the skin and subcutaneous tissue: Secondary | ICD-10-CM

## 2016-08-10 DIAGNOSIS — K1231 Oral mucositis (ulcerative) due to antineoplastic therapy: Secondary | ICD-10-CM

## 2016-08-10 DIAGNOSIS — C50411 Malignant neoplasm of upper-outer quadrant of right female breast: Secondary | ICD-10-CM

## 2016-08-10 DIAGNOSIS — D709 Neutropenia, unspecified: Secondary | ICD-10-CM

## 2016-08-10 DIAGNOSIS — D61810 Antineoplastic chemotherapy induced pancytopenia: Secondary | ICD-10-CM

## 2016-08-10 DIAGNOSIS — R5081 Fever presenting with conditions classified elsewhere: Secondary | ICD-10-CM

## 2016-08-10 DIAGNOSIS — E876 Hypokalemia: Secondary | ICD-10-CM

## 2016-08-10 DIAGNOSIS — R7881 Bacteremia: Secondary | ICD-10-CM | POA: Diagnosis present

## 2016-08-10 DIAGNOSIS — E871 Hypo-osmolality and hyponatremia: Secondary | ICD-10-CM

## 2016-08-10 DIAGNOSIS — L03114 Cellulitis of left upper limb: Secondary | ICD-10-CM | POA: Diagnosis not present

## 2016-08-10 DIAGNOSIS — Z95828 Presence of other vascular implants and grafts: Secondary | ICD-10-CM

## 2016-08-10 LAB — COMPREHENSIVE METABOLIC PANEL
ALK PHOS: 65 U/L (ref 38–126)
ALT: 18 U/L (ref 14–54)
AST: 17 U/L (ref 15–41)
Albumin: 2.6 g/dL — ABNORMAL LOW (ref 3.5–5.0)
Anion gap: 6 (ref 5–15)
BUN: <5 mg/dL — ABNORMAL LOW (ref 6–20)
CALCIUM: 8.3 mg/dL — AB (ref 8.9–10.3)
CO2: 26 mmol/L (ref 22–32)
CREATININE: 0.63 mg/dL (ref 0.44–1.00)
Chloride: 106 mmol/L (ref 101–111)
Glucose, Bld: 106 mg/dL — ABNORMAL HIGH (ref 65–99)
Potassium: 3.4 mmol/L — ABNORMAL LOW (ref 3.5–5.1)
Sodium: 138 mmol/L (ref 135–145)
TOTAL PROTEIN: 5.5 g/dL — AB (ref 6.5–8.1)
Total Bilirubin: 0.1 mg/dL — ABNORMAL LOW (ref 0.3–1.2)

## 2016-08-10 LAB — CBC WITH DIFFERENTIAL/PLATELET
BASOS PCT: 0 %
Band Neutrophils: 16 %
Basophils Absolute: 0 10*3/uL (ref 0.0–0.1)
Blasts: 3 %
Eosinophils Absolute: 0 10*3/uL (ref 0.0–0.7)
Eosinophils Relative: 0 %
HCT: 26.1 % — ABNORMAL LOW (ref 36.0–46.0)
HEMOGLOBIN: 9.1 g/dL — AB (ref 12.0–15.0)
LYMPHS PCT: 10 %
Lymphs Abs: 0.3 10*3/uL — ABNORMAL LOW (ref 0.7–4.0)
MCH: 31.3 pg (ref 26.0–34.0)
MCHC: 34.9 g/dL (ref 30.0–36.0)
MCV: 89.7 fL (ref 78.0–100.0)
MONO ABS: 0.2 10*3/uL (ref 0.1–1.0)
MONOS PCT: 8 %
Metamyelocytes Relative: 3 %
Myelocytes: 4 %
NEUTROS ABS: 2.1 10*3/uL (ref 1.7–7.7)
NEUTROS PCT: 56 %
NRBC: 2 /100{WBCs} — AB
OTHER: 0 %
PROMYELOCYTES ABS: 0 %
Platelets: 165 10*3/uL (ref 150–400)
RBC: 2.91 MIL/uL — ABNORMAL LOW (ref 3.87–5.11)
RDW: 13 % (ref 11.5–15.5)
WBC: 2.6 10*3/uL — ABNORMAL LOW (ref 4.0–10.5)

## 2016-08-10 LAB — CULTURE, BLOOD (ROUTINE X 2)

## 2016-08-10 LAB — PATHOLOGIST SMEAR REVIEW

## 2016-08-10 MED ORDER — BUTALBITAL-APAP-CAFFEINE 50-325-40 MG PO TABS
2.0000 | ORAL_TABLET | Freq: Four times a day (QID) | ORAL | Status: DC | PRN
  Administered 2016-08-10: 2 via ORAL
  Filled 2016-08-10: qty 2

## 2016-08-10 MED ORDER — POTASSIUM CHLORIDE CRYS ER 20 MEQ PO TBCR
40.0000 meq | EXTENDED_RELEASE_TABLET | Freq: Once | ORAL | Status: AC
  Administered 2016-08-10: 40 meq via ORAL
  Filled 2016-08-10: qty 2

## 2016-08-10 MED ORDER — HEPARIN SOD (PORK) LOCK FLUSH 100 UNIT/ML IV SOLN
500.0000 [IU] | INTRAVENOUS | Status: AC | PRN
  Administered 2016-08-10: 500 [IU]

## 2016-08-10 MED ORDER — DOXYCYCLINE HYCLATE 100 MG PO CAPS: 100.0000 mg | ORAL_CAPSULE | Freq: Two times a day (BID) | ORAL | 0 refills | Status: DC

## 2016-08-10 MED ORDER — LEVOFLOXACIN 500 MG PO TABS: 500.0000 mg | ORAL_TABLET | Freq: Two times a day (BID) | ORAL | 0 refills | Status: DC

## 2016-08-10 MED ORDER — BUTALBITAL-APAP-CAFFEINE 50-325-40 MG PO TABS: 2.0000 | ORAL_TABLET | Freq: Four times a day (QID) | ORAL | 0 refills | Status: DC | PRN

## 2016-08-10 NOTE — Progress Notes (Signed)
April [name]   DOB:Apr 18, 1961   OB#:[id number]   HUT#:[id number]  Subjective: April [name] is finally feeling "fine." She did have another migraine last night, but it has cleared. She tells me her skin lesions seem to be related to her antidepressants and when she was off them she wouldn't get them--we can try to taper those to off as outpatient. She had 3 "good" BMs yesterday. She stayed mostly in bed yesterday but sat up some and walked to the BR "a lot" to urinate. Agitating for discharge. No family in room   Objective: middle aged White woman examined in bed Vitals:   08/09/16 2150 08/10/16 0527  BP: (!) 135/97 114/78  Pulse: 95 88  Resp: 20 20  Temp: 98 F (36.7 C) 98.7 F (37.1 C)    Body mass index is 38.62 kg/m.  Intake/Output Summary (Last 24 hours) at 08/10/16 0815 Last data filed at 08/10/16 0600  Gross per 24 hour  Intake              830 ml  Output                0 ml  Net              830 ml     Sclerae unicteric  Oropharynx shows no thrush or other lesions  No cervical or supraclavicular adenopathy  Lungs no rales or wheezes-  Heart regular rate and rhythm  Abdomen soft, +BS  Neuro nonfocal  Breast exam: deferred  Port site: no tenderness, swelling or erythema  CBG (last 3)  No results for input(s): GLUCAP in the last 72 hours.   Labs:  Lab Results  Component Value Date   WBC 2.6 (L) 08/10/2016   HGB 9.1 (L) 08/10/2016   HCT 26.1 (L) 08/10/2016   MCV 89.7 08/10/2016   PLT 165 08/10/2016   NEUTROABS 2.1 08/10/2016    '@LASTCHEMISTRY'$ @  Urine Studies No results for input(s): UHGB, CRYS in the last 72 hours.  Invalid input(s): UACOL, UAPR, USPG, UPH, UTP, UGL, UKET, UBIL, UNIT, UROB, [place], UEPI, UWBC, Junie Panning [place], [place], [place]  Basic Metabolic Panel:  Recent Labs Lab 08/07/16 1720 08/07/16 1725 08/08/16 0530 08/09/16 0450 08/10/16 0413  NA 130*  --  136 135 138  K 3.6  --  2.9* 3.6 3.4*  CL 100*  --  104 108 106  CO2 20*  --  20* 22 26   GLUCOSE 109*  --  127* 103* 106*  BUN 9  --  6 5* <5*  CREATININE 0.61  --  0.69 0.46 0.63  CALCIUM 9.0  --  7.4* 8.0* 8.3*  MG  --  1.8  --   --   --    GFR Estimated Creatinine Clearance: 93.5 mL/min (by C-G formula based on SCr of 0.8 mg/dL). Liver Function Tests:  Recent Labs Lab 08/07/16 1720 08/08/16 0530 08/09/16 0450 08/10/16 0413  AST 9* 9* 10* 17  ALT 15 12* 15 18  ALKPHOS 80 61 60 65  BILITOT 0.6 0.5 0.4 0.1*  PROT 7.1 5.5* 5.8* 5.5*  ALBUMIN 3.6 2.7* 2.8* 2.6*   No results for input(s): LIPASE, AMYLASE in the last 168 hours. No results for input(s): AMMONIA in the last 168 hours. Coagulation profile No results for input(s): INR, PROTIME in the last 168 hours.  CBC:  Recent Labs Lab 08/07/16 1720 08/08/16 0530 08/09/16 0450 08/09/16 1401 08/10/16 0413  WBC 0.2* 0.1* 0.8* 1.5* 2.6*  NEUTROABS 0.0*  0.0* 0.2* 0.8* 2.1  HGB 11.9* 9.2* 9.3* 9.7* 9.1*  HCT 33.8* 26.6* 27.0* 28.3* 26.1*  MCV 88.7 92.4 90.0 92.2 89.7  PLT 140* 108* 104* 139* 165   Cardiac Enzymes: No results for input(s): CKTOTAL, CKMB, CKMBINDEX, TROPONINI in the last 168 hours. BNP: Invalid input(s): POCBNP CBG: No results for input(s): GLUCAP in the last 168 hours. D-Dimer No results for input(s): DDIMER in the last 72 hours. Hgb A1c No results for input(s): HGBA1C in the last 72 hours. Lipid Profile No results for input(s): CHOL, HDL, LDLCALC, TRIG, CHOLHDL, LDLDIRECT in the last 72 hours. Thyroid function studies No results for input(s): TSH, T4TOTAL, T3FREE, THYROIDAB in the last 72 hours.  Invalid input(s): FREET3 Anemia work up No results for input(s): VITAMINB12, FOLATE, FERRITIN, TIBC, IRON, RETICCTPCT in the last 72 hours. Microbiology Recent Results (from the past 240 hour(s))  Culture, blood (Routine X 2)     Status: None (Preliminary result)   Collection Time: 08/07/16  5:25 PM  Result Value Ref Range Status   Specimen Description BLOOD BLOOD LEFT ARM  Final    Special Requests NONE  Final   Culture   Final    NO GROWTH 2 DAYS Performed at [facility]    Report Status PENDING  Incomplete  Culture, blood (Routine X 2)     Status: Abnormal (Preliminary result)   Collection Time: 08/07/16  7:05 PM  Result Value Ref Range Status   Specimen Description BLOOD LEFT PORTA CATH  Final   Special Requests BOTTLES DRAWN AEROBIC AND ANAEROBIC 5CC  Final   Culture  Setup Time   Final    GRAM NEGATIVE RODS AEROBIC BOTTLE ONLY Organism ID to follow CRITICAL RESULT CALLED TO, READ BACK BY AND VERIFIED WITH: Mila Merry.D. 11:05 08/08/16 ([email or web address])    Culture (A)  Final    KLEBSIELLA PNEUMONIAE SUSCEPTIBILITIES TO FOLLOW Performed at [facility]    Report Status PENDING  Incomplete  Blood Culture ID Panel (Reflexed)     Status: Abnormal   Collection Time: 08/07/16  7:05 PM  Result Value Ref Range Status   Enterococcus species NOT DETECTED NOT DETECTED Final   Listeria monocytogenes NOT DETECTED NOT DETECTED Final   Staphylococcus species NOT DETECTED NOT DETECTED Final   Staphylococcus aureus NOT DETECTED NOT DETECTED Final   Streptococcus species NOT DETECTED NOT DETECTED Final   Streptococcus agalactiae NOT DETECTED NOT DETECTED Final   Streptococcus pneumoniae NOT DETECTED NOT DETECTED Final   Streptococcus pyogenes NOT DETECTED NOT DETECTED Final   Acinetobacter baumannii NOT DETECTED NOT DETECTED Final   Enterobacteriaceae species DETECTED (A) NOT DETECTED Final    Comment: CRITICAL RESULT CALLED TO, READ BACK BY AND VERIFIED WITH: Mila Merry.D. 11:05 08/08/16  ([email or web address])    Enterobacter cloacae complex NOT DETECTED NOT DETECTED Final   Escherichia coli NOT DETECTED NOT DETECTED Final   Klebsiella oxytoca NOT DETECTED NOT DETECTED Final   Klebsiella pneumoniae DETECTED (A) NOT DETECTED Final    Comment: CRITICAL RESULT CALLED TO, READ BACK BY AND VERIFIED WITH: Mila Merry.D. 11:05 08/08/16  ([email or web address])    Proteus species  NOT DETECTED NOT DETECTED Final   Serratia marcescens NOT DETECTED NOT DETECTED Final   Carbapenem resistance NOT DETECTED NOT DETECTED Final   Haemophilus influenzae NOT DETECTED NOT DETECTED Final   Neisseria meningitidis NOT DETECTED NOT DETECTED Final   Pseudomonas aeruginosa NOT DETECTED NOT DETECTED Final   Candida albicans NOT DETECTED NOT DETECTED Final  Candida glabrata NOT DETECTED NOT DETECTED Final   Candida krusei NOT DETECTED NOT DETECTED Final   Candida parapsilosis NOT DETECTED NOT DETECTED Final   Candida tropicalis NOT DETECTED NOT DETECTED Final  Urine culture     Status: Abnormal   Collection Time: 08/07/16  7:50 PM  Result Value Ref Range Status   Specimen Description URINE, CLEAN CATCH  Final   Special Requests NONE  Final   Culture MULTIPLE SPECIES PRESENT, SUGGEST RECOLLECTION (A)  Final   Report Status 08/09/2016 FINAL  Final  MRSA PCR Screening     Status: None   Collection Time: 08/08/16 11:59 AM  Result Value Ref Range Status   MRSA by PCR NEGATIVE NEGATIVE Final    Comment:        The GeneXpert MRSA Assay (FDA approved for NASAL specimens only), is one component of a comprehensive MRSA colonization surveillance program. It is not intended to diagnose MRSA infection nor to guide or monitor treatment for MRSA infections.       Studies:  No results found.  Assessment: 55 y.o. [place] woman admitted with febrile neutropenia  (a) blood culture from port but not from L arm growing enterobacter and klebsiella  (0) status post right breast upper outer quadrant and right axillary lymph node biopsy 06/21/2016, both positive for a clinical T2 N2,stage IIIA invasive ductal carcinoma, grade 3, estrogen and progesterone receptor positive, HER-2 nonamplified, with an MIB-1 between 20 and 25%   (1) neoadjuvant chemotherapy to consist of doxorubicin and cyclophosphamide in dose dense fashion 4, starting 07/17/2016, followed by weekly paclitaxel  12  (2) definitive surgery to follow chemotherapy, with consideration of targeted axillary dissection versus participation in the Alliance trial  (3) adjuvant radiation to follow surgery  (4) anti-estrogens to follow at the completion of local treatment     Plan:  Aquarius is much improved and is hoping to get home today. The [place] is 2.1. Echo yesterday does not mention vegetations (it does not seem to have been specifically reviewed with that in mind). I do not find sensitivities for her blood organisms-- if those are available that can help narrow coverage. Otherwise can consider d/c on levaquin and Augmentin for an additional 5 days  Even though her port and not her arm blood cultures were positive, I find it hard to believe the port would be the source of these organisms. Accordingly would not remove port. Instead I will repeat a blood culture from the port when the patient returns to clinic 9/11 and again when she resumes chemotherapy 9/18. If either is positive would then remove port.  I am going to change her chemo to taxol weekly, which will not cause neutropenia. We will try to get in the final 2 cycles of CA after those treatments.  She does not need a repeat breast MRI or echo and I have cancelled those tests.  Greatly appreciate your excellent care of this patient! Please let me know if I can be of further help ([phone])   Chauncey Cruel, MD 08/10/2016  8:15 AM Medical Oncology and Hematology [facility] [address] [place], [place] [address] Tel. [phone]    Fax. [phone]

## 2016-08-10 NOTE — Discharge Summary (Signed)
Physician Discharge Summary  April [name] [id number] DOB: [birth year] DOA: 08/07/2016  PCP: [name], MD  Admit date: 08/07/2016 Discharge date: 08/10/2016  Admitted From: home Disposition:  home  Recommendations for Outpatient Follow-up:  1. Follow up with Dr Jana Hakim in 1 -2 weeks. Patient will complete 5 more days of antibiotics ( levofloxacin and doxycycline) on 08/15/2016.  Home Health: none Equipment/Devices: none  Discharge Condition: fair CODE STATUS: full code Diet recommendation: Regular    Discharge Diagnoses:  Principal Problem:  sepsis ( [place])   Febrile neutropenia ([place])  Active Problems:      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)   Mucositis due to chemotherapy   Neutropenic fever (H[facility])   Hyponatremia   Positive blood culture   Cellulitis of hand, left   Hypokalemia   Depression (emotion)   Anxiety   Fibromyalgia   Herpes genitalis   Environmental and seasonal allergies   Brief narrative/history of present illness 55 year old female with history of right breast cancer metastatic to axillary nodes (recently started on chemotherapy, currently on cycle 2), anxiety, depression, fibromyalgia, history of migraine and GERD recently admitted after her first chemotherapy with febrile neutropenia presented to the ED with fever, headaches, myalgias and joint pains. She had a fever of 102F at home on the day of admission. In the ED she was septic with fever and severe neutropenia. Admitted for further management. Blood culture from the Port-A-Cath on admission growing Klebsiella.  Hospital course Sepsis with Neutropenic fever ([place]) -One set of  Blood culture from the port A cath on admission growing Klebsiella.. Mostly sensitive. Patient placed on empiric vancomycin and cefepime. -Repeat blood cultures negative. 2-D echo does not show any vegetations. Patient's sepsis has resolved, remains afebrile24 hours and her ANC has improved to  >1400. -ID consult appreciated. Suspect bacteremia unlikely from Port-A-Cath. Recommends to take with 5 more days with Levaquin. Patient is stable to be discharged home. Her oncologist will update another set of blood cultures from the Port-A-Cath during outpatient follow-up.  Active Problems: Migraine headaches No Meningeal signs. Responding poorly to Imitrex. Better with NSAIDs and Fioricet.    Left hand cellulitis  Will discharge on 5 day course of oral doxycycline.    Breast cancer metastasized to axillary lymph node ([place]) On cycle 2 of chemotherapy. Dr. Jana Hakim following. Plan for definitive surgery post chemotherapy followed by adjuvant radiation and antiestrogen.    Mucositis due to chemotherapy Better today. Continue Magic mouthwash.    Cellulitis and abscess of buttocks Recent hospitalization and completed antibiotics.. Small blisters on the buttocks. does not appear to be herpetic lesions. On empiric acyclovir for? genital herpes .   Pancytopenia  secondary to chemotherapy  Hypokalemia Replenished    Family Communication  : none at bedside  Disposition Plan  : home  Consults  :   Dr Jana Hakim (oncology) Dr. Megan Salon (ID)  Procedures  :  2-D echo   Discharge Instructions     Medication List    STOP taking these medications   dexamethasone 4 MG tablet Commonly known as:  DECADRON     TAKE these medications   acyclovir 400 MG tablet Commonly known as:  ZOVIRAX 1 tab by mouth twice daily   antiseptic oral rinse 0.05 % Liqd solution Commonly known as:  CPC / CETYLPYRIDINIUM CHLORIDE 0.05% 7 mLs by Mouth Rinse route 2 times daily at 12 noon and 4 pm.   butalbital-acetaminophen-caffeine 50-325-40 MG tablet Commonly known as:  FIORICET, ESGIC Take 2  tablets by mouth every 6 (six) hours as needed for headache or migraine.   chlorhexidine 0.12 % solution Commonly known as:  PERIDEX 15 mLs by Mouth Rinse route 2 (two) times daily.    desloratadine 5 MG tablet Commonly known as:  CLARINEX Take 1 tablet (5 mg total) by mouth daily.   doxycycline 100 MG capsule Commonly known as:  VIBRAMYCIN Take 1 capsule (100 mg total) by mouth 2 (two) times daily.   DULoxetine 30 MG capsule Commonly known as:  CYMBALTA 1 cap by mouth with 60  mg cap once daily for total of 90 mg daily What changed:  how much to take  how to take this  when to take this  additional instructions   DULoxetine 60 MG capsule Commonly known as:  CYMBALTA Take 1 capsule (60 mg total) by mouth daily. Take along with 30mg  capsule for a total of 90mg  of cymbalta per day. What changed:  Another medication with the same name was changed. Make sure you understand how and when to take each.   ibuprofen 800 MG tablet Commonly known as:  ADVIL,MOTRIN Take 1 tablet (800 mg total) by mouth every 8 (eight) hours as needed.   levofloxacin 500 MG tablet Commonly known as:  LEVAQUIN Take 1 tablet (500 mg total) by mouth 2 (two) times daily.   lidocaine-prilocaine cream Commonly known as:  EMLA Apply 1 application topically as needed.   LORazepam 0.5 MG tablet Commonly known as:  ATIVAN Take 1 tablet (0.5 mg total) by mouth at bedtime as needed (Nausea or vomiting).   magic mouthwash w/lidocaine Soln Take 15 mLs by mouth 4 (four) times daily as needed for mouth pain.   mometasone 50 MCG/ACT nasal spray Commonly known as:  NASONEX 2 sprays each nostril daily   omeprazole 40 MG capsule Commonly known as:  PRILOSEC Take 1 capsule (40 mg total) by mouth daily.   prochlorperazine 10 MG tablet Commonly known as:  COMPAZINE Take 1 tablet (10 mg total) by mouth every 6 (six) hours as needed (Nausea or vomiting).   SUMAtriptan 50 MG tablet Commonly known as:  IMITREX Take 1 tablet (50 mg total) by mouth every 2 (two) hours as needed for migraine. May repeat in 2 hours if headache persists or recurs.      Follow-up Information    MAGRINAT,GUSTAV  C, MD. Schedule an appointment as soon as possible for a visit in 1 week(s).   Specialty:  Oncology Contact information: [place] [address] [phone]          Allergies  Allergen Reactions  . Hydrocodone Nausea Only and Other (See Comments)    dizziness  . Ultram [Tramadol Hcl] Nausea Only  . Gabapentin Rash      Procedures/Studies: Dg Chest 2 View  Result Date: 08/07/2016 CLINICAL DATA:  History of breast cancer.  Chemotherapy. EXAM: CHEST  2 VIEW COMPARISON:  July 11, 2016 FINDINGS: Stable Port-A-Cath. No pneumothorax. The heart, hila, and mediastinum are normal. No pulmonary nodules, masses, or focal infiltrates. IMPRESSION: No active cardiopulmonary disease. Electronically Signed   By: Dorise Bullion III M.D   On: 08/07/2016 17:39   Ct Head Wo Contrast  Result Date: 07/25/2016 CLINICAL DATA:  With patient with elevated temperature. Migraine headache. History a noted at the Breast Cancer. EXAM: CT HEAD WITHOUT CONTRAST TECHNIQUE: Contiguous axial images were obtained from the base of the skull through the vertex without intravenous contrast. COMPARISON:  None. FINDINGS: Brain: Ventricles and  sulci are appropriate for patient's age. No evidence for acute cortically based infarct, intracranial hemorrhage, mass lesion or mass-effect. Vascular: Unremarkable Skull: Intact. Sinuses/Orbits: Mastoid air cells unremarkable. Paranasal sinuses unremarkable. Other: None. IMPRESSION: No acute intracranial process. Electronically Signed   By: Lovey Newcomer M.D.   On: 07/25/2016 20:51   Dg Chest Port 1 View  Result Date: 07/11/2016 CLINICAL DATA:  Port-A-Cath insertion. EXAM: PORTABLE CHEST 1 VIEW COMPARISON:  04/17/2015 FINDINGS: Left anterior chest wall Port-A-Cath extends through the left subclavian vein. Tip lies in the mid superior vena cava. No pneumothorax. Lungs are clear.  Heart, mediastinum and hila are unremarkable. IMPRESSION: 1. Port-A-Cath tip projects in  the mid superior vena cava. No pneumothorax. 2. No acute cardiopulmonary disease. Electronically Signed   By: Lajean Manes M.D.   On: 07/11/2016 17:30    2-D echo (08/09/2016) Study Conclusions  - Left ventricle: The cavity size was normal. There was mild   concentric hypertrophy. Systolic function was vigorous. The   estimated ejection fraction was in the range of 65% to 70%. Wall   motion was normal; there were no regional wall motion   abnormalities. Doppler parameters are consistent with abnormal   left ventricular relaxation (grade 1 diastolic dysfunction). - Left atrium: The atrium was mildly dilated.  Subjective: Remains afebrile. Complained of one episode of severe migraine last night which responded to Fioricet. No nausea and has a better appetite. Left hand pain and swelling much improved.  Discharge Exam: Vitals:   08/10/16 0527 08/10/16 1408  BP: 114/78 130/84  Pulse: 88 99  Resp: 20 18  Temp: 98.7 F (37.1 C) 98.7 F (37.1 C)   Vitals:   08/09/16 1329 08/09/16 2150 08/10/16 0527 08/10/16 1408  BP: 105/63 (!) 135/97 114/78 130/84  Pulse: 91 95 88 99  Resp: 18 20 20 18   Temp: 97.8 F (36.6 C) 98 F (36.7 C) 98.7 F (37.1 C) 98.7 F (37.1 C)  TempSrc: Oral Oral Oral Oral  SpO2: 97% 98% 97% 97%  Weight:      Height:        Gen:not in distress HEENT:  pallor+, moist mucosa, supple neck Chest: clear b/l, no added sounds, port A cath+ CVS: N S1&S2, no murmurs,  GI: soft, NT, ND, BS+, small blisters over buttocks, swollen left hand with a small superficial blister( improved from yesterday) Musculoskeletal: warm, no edema CNS: alert and oriented    The results of significant diagnostics from this hospitalization (including imaging, microbiology, ancillary and laboratory) are listed below for reference.     Microbiology: Recent Results (from the past 240 hour(s))  Culture, blood (Routine X 2)     Status: None (Preliminary result)   Collection Time:  08/07/16  5:25 PM  Result Value Ref Range Status   Specimen Description BLOOD BLOOD LEFT ARM  Final   Special Requests NONE  Final   Culture   Final    NO GROWTH 3 DAYS Performed at [facility]    Report Status PENDING  Incomplete  Culture, blood (Routine X 2)     Status: Abnormal   Collection Time: 08/07/16  7:05 PM  Result Value Ref Range Status   Specimen Description BLOOD LEFT PORTA CATH  Final   Special Requests BOTTLES DRAWN AEROBIC AND ANAEROBIC 5CC  Final   Culture  Setup Time   Final    GRAM NEGATIVE RODS AEROBIC BOTTLE ONLY CRITICAL RESULT CALLED TO, READ BACK BY AND VERIFIED WITH: Christean Grief Pharm.D. 11:05  08/08/16 ([email or web address]) Performed at [place] (A)  Final   Report Status 08/10/2016 FINAL  Final   Organism ID, Bacteria KLEBSIELLA PNEUMONIAE  Final      Susceptibility   Klebsiella pneumoniae - MIC*    AMPICILLIN >=32 RESISTANT Resistant     CEFAZOLIN <=4 SENSITIVE Sensitive     CEFEPIME <=1 SENSITIVE Sensitive     CEFTAZIDIME <=1 SENSITIVE Sensitive     CEFTRIAXONE <=1 SENSITIVE Sensitive     CIPROFLOXACIN <=0.25 SENSITIVE Sensitive     GENTAMICIN <=1 SENSITIVE Sensitive     IMIPENEM <=0.25 SENSITIVE Sensitive     TRIMETH/SULFA <=20 SENSITIVE Sensitive     AMPICILLIN/SULBACTAM 8 SENSITIVE Sensitive     PIP/TAZO <=4 SENSITIVE Sensitive     Extended ESBL NEGATIVE Sensitive     * KLEBSIELLA PNEUMONIAE  Blood Culture ID Panel (Reflexed)     Status: Abnormal   Collection Time: 08/07/16  7:05 PM  Result Value Ref Range Status   Enterococcus species NOT DETECTED NOT DETECTED Final   Listeria monocytogenes NOT DETECTED NOT DETECTED Final   Staphylococcus species NOT DETECTED NOT DETECTED Final   Staphylococcus aureus NOT DETECTED NOT DETECTED Final   Streptococcus species NOT DETECTED NOT DETECTED Final   Streptococcus agalactiae NOT DETECTED NOT DETECTED Final   Streptococcus pneumoniae NOT DETECTED NOT DETECTED Final    Streptococcus pyogenes NOT DETECTED NOT DETECTED Final   Acinetobacter baumannii NOT DETECTED NOT DETECTED Final   Enterobacteriaceae species DETECTED (A) NOT DETECTED Final    Comment: CRITICAL RESULT CALLED TO, READ BACK BY AND VERIFIED WITH: Christean Grief Pharm.D. 11:05 08/08/16  ([email or web address])    Enterobacter cloacae complex NOT DETECTED NOT DETECTED Final   Escherichia coli NOT DETECTED NOT DETECTED Final   Klebsiella oxytoca NOT DETECTED NOT DETECTED Final   Klebsiella pneumoniae DETECTED (A) NOT DETECTED Final    Comment: CRITICAL RESULT CALLED TO, READ BACK BY AND VERIFIED WITH: Mila Merry.D. 11:05 08/08/16  ([email or web address])    Proteus species NOT DETECTED NOT DETECTED Final   Serratia marcescens NOT DETECTED NOT DETECTED Final   Carbapenem resistance NOT DETECTED NOT DETECTED Final   Haemophilus influenzae NOT DETECTED NOT DETECTED Final   Neisseria meningitidis NOT DETECTED NOT DETECTED Final   Pseudomonas aeruginosa NOT DETECTED NOT DETECTED Final   Candida albicans NOT DETECTED NOT DETECTED Final   Candida glabrata NOT DETECTED NOT DETECTED Final   Candida krusei NOT DETECTED NOT DETECTED Final   Candida parapsilosis NOT DETECTED NOT DETECTED Final   Candida tropicalis NOT DETECTED NOT DETECTED Final  Urine culture     Status: Abnormal   Collection Time: 08/07/16  7:50 PM  Result Value Ref Range Status   Specimen Description URINE, CLEAN CATCH  Final   Special Requests NONE  Final   Culture MULTIPLE SPECIES PRESENT, SUGGEST RECOLLECTION (A)  Final   Report Status 08/09/2016 FINAL  Final  MRSA PCR Screening     Status: None   Collection Time: 08/08/16 11:59 AM  Result Value Ref Range Status   MRSA by PCR NEGATIVE NEGATIVE Final    Comment:        The GeneXpert MRSA Assay (FDA approved for NASAL specimens only), is one component of a comprehensive MRSA colonization surveillance program. It is not intended to diagnose MRSA infection nor to guide or monitor treatment for MRSA  infections.   Culture, blood (routine x 2)     Status: None (Preliminary result)   Collection  Time: 08/09/16  2:08 PM  Result Value Ref Range Status   Specimen Description BLOOD LEFT ARM  Final   Special Requests BOTTLES DRAWN AEROBIC AND ANAEROBIC 10CC  Final   Culture   Final    NO GROWTH < 24 HOURS Performed at [facility]    Report Status PENDING  Incomplete     Labs: BNP (last 3 results) No results for input(s): BNP in the last 8760 hours. Basic Metabolic Panel:  Recent Labs Lab 08/07/16 1720 08/07/16 1725 08/08/16 0530 08/09/16 0450 08/10/16 0413  NA 130*  --  136 135 138  K 3.6  --  2.9* 3.6 3.4*  CL 100*  --  104 108 106  CO2 20*  --  20* 22 26  GLUCOSE 109*  --  127* 103* 106*  BUN 9  --  6 5* <5*  CREATININE 0.61  --  0.69 0.46 0.63  CALCIUM 9.0  --  7.4* 8.0* 8.3*  MG  --  1.8  --   --   --    Liver Function Tests:  Recent Labs Lab 08/07/16 1720 08/08/16 0530 08/09/16 0450 08/10/16 0413  AST 9* 9* 10* 17  ALT 15 12* 15 18  ALKPHOS 80 61 60 65  BILITOT 0.6 0.5 0.4 0.1*  PROT 7.1 5.5* 5.8* 5.5*  ALBUMIN 3.6 2.7* 2.8* 2.6*   No results for input(s): LIPASE, AMYLASE in the last 168 hours. No results for input(s): AMMONIA in the last 168 hours. CBC:  Recent Labs Lab 08/07/16 1720 08/08/16 0530 08/09/16 0450 08/09/16 1401 08/10/16 0413  WBC 0.2* 0.1* 0.8* 1.5* 2.6*  NEUTROABS 0.0* 0.0* 0.2* 0.8* 2.1  HGB 11.9* 9.2* 9.3* 9.7* 9.1*  HCT 33.8* 26.6* 27.0* 28.3* 26.1*  MCV 88.7 92.4 90.0 92.2 89.7  PLT 140* 108* 104* 139* 165   Cardiac Enzymes: No results for input(s): CKTOTAL, CKMB, CKMBINDEX, TROPONINI in the last 168 hours. BNP: Invalid input(s): POCBNP CBG: No results for input(s): GLUCAP in the last 168 hours. D-Dimer No results for input(s): DDIMER in the last 72 hours. Hgb A1c No results for input(s): HGBA1C in the last 72 hours. Lipid Profile No results for input(s): CHOL, HDL, LDLCALC, TRIG, CHOLHDL, LDLDIRECT in the  last 72 hours. Thyroid function studies No results for input(s): TSH, T4TOTAL, T3FREE, THYROIDAB in the last 72 hours.  Invalid input(s): FREET3 Anemia work up No results for input(s): VITAMINB12, FOLATE, FERRITIN, TIBC, IRON, RETICCTPCT in the last 72 hours. Urinalysis    Component Value Date/Time   COLORURINE YELLOW 08/07/2016 1950   APPEARANCEUR CLEAR 08/07/2016 1950   LABSPEC 1.013 08/07/2016 1950   LABSPEC 1.005 07/25/2016 1643   PHURINE 8.5 (H) 08/07/2016 1950   GLUCOSEU NEGATIVE 08/07/2016 1950   GLUCOSEU Negative 07/25/2016 1643   HGBUR NEGATIVE 08/07/2016 1950   BILIRUBINUR NEGATIVE 08/07/2016 1950   BILIRUBINUR Negative 07/25/2016 1643   [place] 08/07/2016 1950   PROTEINUR NEGATIVE 08/07/2016 1950   UROBILINOGEN 0.2 07/25/2016 1643   NITRITE NEGATIVE 08/07/2016 1950   LEUKOCYTESUR NEGATIVE 08/07/2016 1950   LEUKOCYTESUR Negative 07/25/2016 1643   Sepsis Labs Invalid input(s): PROCALCITONIN,  WBC,  LACTICIDVEN Microbiology Recent Results (from the past 240 hour(s))  Culture, blood (Routine X 2)     Status: None (Preliminary result)   Collection Time: 08/07/16  5:25 PM  Result Value Ref Range Status   Specimen Description BLOOD BLOOD LEFT ARM  Final   Special Requests NONE  Final   Culture   Final  NO GROWTH 3 DAYS Performed at [facility]    Report Status PENDING  Incomplete  Culture, blood (Routine X 2)     Status: Abnormal   Collection Time: 08/07/16  7:05 PM  Result Value Ref Range Status   Specimen Description BLOOD LEFT PORTA CATH  Final   Special Requests BOTTLES DRAWN AEROBIC AND ANAEROBIC 5CC  Final   Culture  Setup Time   Final    GRAM NEGATIVE RODS AEROBIC BOTTLE ONLY CRITICAL RESULT CALLED TO, READ BACK BY AND VERIFIED WITH: Christean Grief Pharm.D. 11:05 08/08/16 ([email or web address]) Performed at [place] (A)  Final   Report Status 08/10/2016 FINAL  Final   Organism ID, Bacteria KLEBSIELLA  PNEUMONIAE  Final      Susceptibility   Klebsiella pneumoniae - MIC*    AMPICILLIN >=32 RESISTANT Resistant     CEFAZOLIN <=4 SENSITIVE Sensitive     CEFEPIME <=1 SENSITIVE Sensitive     CEFTAZIDIME <=1 SENSITIVE Sensitive     CEFTRIAXONE <=1 SENSITIVE Sensitive     CIPROFLOXACIN <=0.25 SENSITIVE Sensitive     GENTAMICIN <=1 SENSITIVE Sensitive     IMIPENEM <=0.25 SENSITIVE Sensitive     TRIMETH/SULFA <=20 SENSITIVE Sensitive     AMPICILLIN/SULBACTAM 8 SENSITIVE Sensitive     PIP/TAZO <=4 SENSITIVE Sensitive     Extended ESBL NEGATIVE Sensitive     * KLEBSIELLA PNEUMONIAE  Blood Culture ID Panel (Reflexed)     Status: Abnormal   Collection Time: 08/07/16  7:05 PM  Result Value Ref Range Status   Enterococcus species NOT DETECTED NOT DETECTED Final   Listeria monocytogenes NOT DETECTED NOT DETECTED Final   Staphylococcus species NOT DETECTED NOT DETECTED Final   Staphylococcus aureus NOT DETECTED NOT DETECTED Final   Streptococcus species NOT DETECTED NOT DETECTED Final   Streptococcus agalactiae NOT DETECTED NOT DETECTED Final   Streptococcus pneumoniae NOT DETECTED NOT DETECTED Final   Streptococcus pyogenes NOT DETECTED NOT DETECTED Final   Acinetobacter baumannii NOT DETECTED NOT DETECTED Final   Enterobacteriaceae species DETECTED (A) NOT DETECTED Final    Comment: CRITICAL RESULT CALLED TO, READ BACK BY AND VERIFIED WITH: Mila Merry.D. 11:05 08/08/16  ([email or web address])    Enterobacter cloacae complex NOT DETECTED NOT DETECTED Final   Escherichia coli NOT DETECTED NOT DETECTED Final   Klebsiella oxytoca NOT DETECTED NOT DETECTED Final   Klebsiella pneumoniae DETECTED (A) NOT DETECTED Final    Comment: CRITICAL RESULT CALLED TO, READ BACK BY AND VERIFIED WITH: Mila Merry.D. 11:05 08/08/16  ([email or web address])    Proteus species NOT DETECTED NOT DETECTED Final   Serratia marcescens NOT DETECTED NOT DETECTED Final   Carbapenem resistance NOT DETECTED NOT DETECTED Final   Haemophilus  influenzae NOT DETECTED NOT DETECTED Final   Neisseria meningitidis NOT DETECTED NOT DETECTED Final   Pseudomonas aeruginosa NOT DETECTED NOT DETECTED Final   Candida albicans NOT DETECTED NOT DETECTED Final   Candida glabrata NOT DETECTED NOT DETECTED Final   Candida krusei NOT DETECTED NOT DETECTED Final   Candida parapsilosis NOT DETECTED NOT DETECTED Final   Candida tropicalis NOT DETECTED NOT DETECTED Final  Urine culture     Status: Abnormal   Collection Time: 08/07/16  7:50 PM  Result Value Ref Range Status   Specimen Description URINE, CLEAN CATCH  Final   Special Requests NONE  Final   Culture MULTIPLE SPECIES PRESENT, SUGGEST RECOLLECTION (A)  Final   Report Status 08/09/2016 FINAL  Final  MRSA PCR Screening     Status: None   Collection Time: 08/08/16 11:59 AM  Result Value Ref Range Status   MRSA by PCR NEGATIVE NEGATIVE Final    Comment:        The GeneXpert MRSA Assay (FDA approved for NASAL specimens only), is one component of a comprehensive MRSA colonization surveillance program. It is not intended to diagnose MRSA infection nor to guide or monitor treatment for MRSA infections.   Culture, blood (routine x 2)     Status: None (Preliminary result)   Collection Time: 08/09/16  2:08 PM  Result Value Ref Range Status   Specimen Description BLOOD LEFT ARM  Final   Special Requests BOTTLES DRAWN AEROBIC AND ANAEROBIC 10CC  Final   Culture   Final    NO GROWTH < 24 HOURS Performed at [facility]    Report Status PENDING  Incomplete     Time coordinating discharge: Over 30 minutes  SIGNED:   Louellen Molder, MD  [facility] 08/10/2016, 5:09 PM Pager   If 7PM-7AM, please contact night-coverage [email or web address] Password [email or web address]

## 2016-08-10 NOTE — Discharge Instructions (Signed)
Neutropenic Fever °Neutropenic fever is a type of fever that can develop in someone who has a very low number of a certain kind of white blood cells called neutrophils (neutropenia). These blood cells are important for fighting infections caused by bacteria and fungi. When you have neutropenia, you could be in danger of a severe infection. You may need to start taking antibiotic medicines. °CAUSES °Neutrophils are made in the spongy tissue inside your bones (bone marrow). Anything that damages your bone marrow or damages neutrophils after they leave your bone marrow can cause neutropenia. Once you have a dangerously low level of neutrophils, you are at risk for infection and neutropenic fever. °Causes of neutropenia may include: °· Cancer treatments. °· Bone marrow cancer. °· Cancer of the white blood cells (leukemia or myeloma). °· Severe infection. °· Bone marrow failure (aplastic anemia). °· Many types of medicines. °· Diseases of the body's defense system (autoimmune diseases). °· Inherited genes that cause neutropenia. °· Vitamin B deficiency. °· Spleen enlargement in rheumatoid arthritis (Felty syndrome). °SIGNS AND SYMPTOMS °Fever is the main symptom of neutropenic fever. Other signs and symptoms may include: °· Chills. °· Fatigue. °· Painful mouth ulcers. °· Cough. °· Shortness of breath. °· Swollen glands (lymph nodes). °· Sore throat. °· Sinus and ear infections. °· Gum disease. °· Skin infection. °· Burning and frequent urination. °· Rectal infections. °· Vaginal discharge or itching. °DIAGNOSIS  °Your health care provider may diagnose neutropenic fever if your neutrophil count is less than 500 neutrophils per microliter of blood and you have a fever of at least 100.4°F (38.0°C).  °· Blood tests and other tests that measure neutrophils will be done. These may include: °¨ A complete blood count (CBC) and a differential white blood count (WBC). °¨ Peripheral smear. This test involves checking a blood sample  under a microscope. °· Other types of tests may also be done, including: °¨ Chest X-rays. °¨ Cultures of blood and body fluids to look for a source of infection. °Your health care provider will also determine if your neutropenic fever is high risk or low risk. °· You may have high-risk neutropenic fever if: °¨ Your neutrophil count is less than 100 neutrophils per microliter of blood. °¨ You have also been diagnosed with pneumonia or another serious medical problem. °¨ Your condition requires you to be treated in the hospital. °· You may have low-risk neutropenic fever if: °¨ Your neutrophil count is more than 100 neutrophils per microliter of blood. °¨ Your chest X-ray is normal. °¨ You do not have an active illness or any other problems that require you to be in the hospital. °TREATMENT  °You may start treatment as soon as you get diagnosed with neutropenic fever, even if your health care provider is still looking for the source of infection. °· Treatment for high-risk neutropenic fever is antibiotic medicine given through an IV access tube. This is done in the hospital. You may be given a single antibiotic or a combination of antibiotics. °· Low-risk neutropenic fever may be treated at home. You may have to take one or two different oral antibiotics. In some cases, you may need to be treated with IV antibiotics that are given by a home health care provider who visits your home. °· If your health care provider finds a specific cause of infection, you may be switched to the antibiotics that work best against those particular bacteria. °· If a fungal infection is found, your medicine will be changed to an antifungal   that are given by a home health care provider who visits your home.   If your health care provider finds a specific cause of infection, you may be switched to the antibiotics that work best against those particular bacteria.   If a fungal infection is found, your medicine will be changed to an antifungal medicine.   If the fever goes away in 3-5 days, you may have to take medicine for about 7 days. If the fever is not responding, you may have to take medicine longer.   You may have to stop taking any medicine that could be causing neutropenic fever.   If you have neutropenic fever from cancer  treatment drugs (chemotherapy), you may need to take a type of medicine called white blood cell growth factors. This medicine can help prevent fever.  HOME CARE INSTRUCTIONS   Only take medicines as directed by your health care provider.    If you are being treated with oral antibiotics at home, you may need to return to your health care provider every day to have your CBC checked. You may have to do this until your fever responds.    Take your antibiotics as directed. Make sure you finish them even if you start to feel better.   Preventing infection is important when you have neutropenia. Here are some ways to prevent infections:    Avoid sick friends and family members.    Wash your hands often.    Do noteat uncooked or undercooked meats.    Wash all fruits and vegetables.    Do not eat or drink unpasteurized dairy products.    Get regular dental care, and maintain good dental hygiene.    Get a flu shot. Ask your health care provider whether you need any other vaccines.    Wear gloves when gardening.   Follow up with your health care provider as directed.  SEEK MEDICAL CARE IF:   You have chills.   You have a fever.   You have signs or symptoms of infection.  SEEK IMMEDIATE MEDICAL CARE IF:   You have trouble breathing.   You have chest pain.     This information is not intended to replace advice given to you by your health care provider. Make sure you discuss any questions you have with your health care provider.     Document Released: 11/25/2013 Document Reviewed: 11/25/2013  Elsevier Interactive Patient Education 2016 [employer]

## 2016-08-10 NOTE — Progress Notes (Signed)
Discharge instructions given to pt, verbalized understanding. Left the unit in stable condition. 

## 2016-08-10 NOTE — Consult Note (Signed)
[facility] for Infectious Disease    Date of Admission:  08/07/2016           Day 4 vancomycin        Day 4 cefepime       Reason for Consult: Febrile neutropenia     Referring Physician: Dr. 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 acyclovir  400 mg Oral BID  . ceFEPime (MAXIPIME) IV  2 g Intravenous Q8H  . DULoxetine  90 mg Oral Daily  . enoxaparin (LOVENOX) injection  50 mg Subcutaneous Q24H  . fluticasone  1 spray Each Nare Daily  . loratadine  10 mg Oral Daily  . mouth rinse  15 mL Mouth Rinse BID  . pantoprazole  40 mg Oral Daily  . pneumococcal 23 valent vaccine  0.5 mL Intramuscular Tomorrow-1000  . polyethylene glycol  17 g Oral Daily  . sodium chloride flush  3 mL Intravenous Q12H    Recommendations: 1. Recommend discharge home on oral ciprofloxacin 500 mg twice a day and doxycycline 100 mg twice a day for 5 more days.   Assessment: [name] is had 2 recent bouts of febrile neutropenia. The cause of this episode is not entirely clear. I am not convinced that the one blood culture from her Port-A-Cath is the source but would recommend continuing oral therapy for 5 more days just in case it is. I'm also concerned about her new left hand lesion and possible cellulitis. She defervesced rapidly and her absolute neutrophil count has rebounded quickly to 2100. I favor discharge home on 5 more days of oral ciprofloxacin and doxycycline.    HPI: April [name] is a 55 y.o. female who has recently received 2 cycles of cyclophosphamide and doxorubicin for right breast cancer. She developed febrile neutropenia after her  initial dose and was hospitalized from 07/25/2016 to 07/27/2016. Her fever neutropenia resolved promptly after vancomycin and cefepime and she was discharged home on oral doxycycline for 6 days because of some possibly infected skin lesions on her buttocks. She received her second dose of chemotherapy on 07/31/2016. She was admitted 3 days ago with a fever of 101.1. Her total white blood cell count was 100 with no neutrophils. She was started back on vancomycin and cefepime. One blood culture drawn from her Port-A-Cath is growing Klebsiella. Peripheral stick blood culture was negative. She has had in a long history dating back to 2012 of recurrent skin lesions on her back and buttocks. Just as she developed this bout of fever she developed a new skin lesion on her right hand with redness, swelling and tenderness over the dorsum of her hand. She has no other new skin lesions. She tells me today that she is feeling much better and that the redness, swelling and tenderness of her hand is much improved.   Review of Systems: Review of Systems  Constitutional: Positive for chills, fever and malaise/fatigue. Negative for diaphoresis and weight loss.  HENT: Negative for sore throat.        Some mouth and tongue ulcers.  Eyes: Positive for  redness.       Left eye redness without irritation recently.  Respiratory: Positive for cough. Negative for sputum production, shortness of breath and wheezing.   Cardiovascular: Negative for chest pain.  Gastrointestinal: Positive for heartburn and nausea. Negative for abdominal pain, diarrhea and vomiting.  Genitourinary: Negative for dysuria and frequency.  Musculoskeletal: Negative for joint pain and myalgias.  Skin: Positive for rash. Negative for itching.  Neurological: Positive for headaches. Negative for dizziness.  Psychiatric/Behavioral: Positive for depression. The patient is not nervous/anxious.     Past Medical History:  Diagnosis Date  . Allergy   .  Anxiety   . Arthritis    knees  . Bilateral ankle fractures 07/2015   [place]  . Cancer [facility]) dx June 22, 2016   right breast  . Depression    Multiple  episodes  in past.  . Fibromyalgia 2013   diagnosed by Dr. [name]  . Genital herpes 2005   Has outbreaks monthly  . GERD (gastroesophageal reflux disease)   . Migraine   . Obesity   . Right wrist fracture 06/2015    Social History  Substance Use Topics  . Smoking status: Former Smoker    Packs/day: 0.25    Years: 15.00    Types: Cigarettes    Quit date: 01/21/1994  . Smokeless tobacco: Never Used  . Alcohol use 1.2 - 2.4 oz/week    2 - 4 Standard drinks or equivalent per week     Comment: Couple beers or wine a week.    Family History  Problem Relation Age of Onset  . Arthritis Mother   . Hypertension Mother   . Heart disease Mother   . Dementia Mother   . Irritable bowel syndrome Mother   . Emphysema Father   . Cancer Father     bladder  . Cerebral aneurysm Father     ruptured aneurysm was cause of death  . Bipolar disorder Daughter     Not clear if this is the case.  Possibly Bipolar II  . Depression Daughter   . Graves' disease Sister   . Vitiligo Sister   . Mental illness Brother     Depression  . Mental illness Sister     likely undiagnosed schizophrenia  . Mental illness Brother     Schizophrenia   Allergies  Allergen Reactions  . Hydrocodone Nausea Only and Other (See Comments)    dizziness  . Ultram [Tramadol Hcl] Nausea Only  . Gabapentin Rash    OBJECTIVE: Blood pressure 130/84, pulse 99, temperature 98.7 F (37.1 C), temperature source Oral, resp. rate 18, height 5\' 4"  (1.626 m), weight 225 lb (102.1 kg), SpO2 97 %.  Physical Exam  Constitutional: She is oriented to person, place, and time.  She is in good spirits.  HENT:  Mouth/Throat: No oropharyngeal exudate.  She has several tiny, white ulcerations on her tongue.  Eyes:  Medial conjunctival hemorrhage in her left eye.    Cardiovascular: Normal rate and regular rhythm.   No murmur heard. Pulmonary/Chest: Effort normal and breath sounds normal. She has no wheezes. She has no rales.  There is a tape reaction with superficial ulceration on her left anterior chest near her Port-A-Cath site. No other abnormalities are noted.  Abdominal: Soft. She exhibits no mass. There is no tenderness.  Musculoskeletal: Normal range of motion. She exhibits no edema or tenderness.  Neurological: She is alert and oriented to person, place, and time.  Skin: Rash noted.  She  has a 12 mm lesion on her left index finger MCP joint. It is somewhat vesicular. There is surrounding erythema. She notes that the redness and swelling over the dorsum of her hand is markedly improved.  She has numerous more chronic ulcerative lesions on her buttocks bilaterally. There is no evidence of secondary bacterial infection.  Psychiatric: Mood and affect normal.    Lab Results Lab Results  Component Value Date   WBC 2.6 (L) 08/10/2016   HGB 9.1 (L) 08/10/2016   HCT 26.1 (L) 08/10/2016   MCV 89.7 08/10/2016   PLT 165 08/10/2016    Lab Results  Component Value Date   CREATININE 0.63 08/10/2016   BUN <5 (L) 08/10/2016   NA 138 08/10/2016   K 3.4 (L) 08/10/2016   CL 106 08/10/2016   CO2 26 08/10/2016    Lab Results  Component Value Date   ALT 18 08/10/2016   AST 17 08/10/2016   ALKPHOS 65 08/10/2016   BILITOT 0.1 (L) 08/10/2016     Microbiology: Recent Results (from the past 240 hour(s))  Culture, blood (Routine X 2)     Status: None (Preliminary result)   Collection Time: 08/07/16  5:25 PM  Result Value Ref Range Status   Specimen Description BLOOD BLOOD LEFT ARM  Final   Special Requests NONE  Final   Culture   Final    NO GROWTH 3 DAYS Performed at [facility]    Report Status PENDING  Incomplete  Culture, blood (Routine X 2)     Status: Abnormal   Collection Time: 08/07/16  7:05 PM  Result Value Ref Range Status    Specimen Description BLOOD LEFT PORTA CATH  Final   Special Requests BOTTLES DRAWN AEROBIC AND ANAEROBIC 5CC  Final   Culture  Setup Time   Final    GRAM NEGATIVE RODS AEROBIC BOTTLE ONLY CRITICAL RESULT CALLED TO, READ BACK BY AND VERIFIED WITH: Christean Grief Pharm.D. 11:05 08/08/16 ([email or web address]) Performed at [facility] (A)  Final   Report Status 08/10/2016 FINAL  Final   Organism ID, Bacteria KLEBSIELLA PNEUMONIAE  Final      Susceptibility   Klebsiella pneumoniae - MIC*    AMPICILLIN >=32 RESISTANT Resistant     CEFAZOLIN <=4 SENSITIVE Sensitive     CEFEPIME <=1 SENSITIVE Sensitive     CEFTAZIDIME <=1 SENSITIVE Sensitive     CEFTRIAXONE <=1 SENSITIVE Sensitive     CIPROFLOXACIN <=0.25 SENSITIVE Sensitive     GENTAMICIN <=1 SENSITIVE Sensitive     IMIPENEM <=0.25 SENSITIVE Sensitive     TRIMETH/SULFA <=20 SENSITIVE Sensitive     AMPICILLIN/SULBACTAM 8 SENSITIVE Sensitive     PIP/TAZO <=4 SENSITIVE Sensitive     Extended ESBL NEGATIVE Sensitive     * KLEBSIELLA PNEUMONIAE  Blood Culture ID Panel (Reflexed)     Status: Abnormal   Collection Time: 08/07/16  7:05 PM  Result Value Ref Range Status   Enterococcus species NOT DETECTED NOT DETECTED Final   Listeria monocytogenes NOT DETECTED NOT DETECTED Final   Staphylococcus species NOT DETECTED NOT DETECTED Final   Staphylococcus aureus NOT DETECTED NOT DETECTED Final   Streptococcus species NOT DETECTED NOT DETECTED Final   Streptococcus agalactiae NOT DETECTED NOT DETECTED Final   Streptococcus pneumoniae NOT DETECTED NOT DETECTED Final   Streptococcus pyogenes NOT DETECTED NOT DETECTED Final   Acinetobacter baumannii NOT DETECTED NOT DETECTED Final   Enterobacteriaceae species DETECTED (A) NOT DETECTED Final  Comment: CRITICAL RESULT CALLED TO, READ BACK BY AND VERIFIED WITH: Christean Grief Pharm.D. 11:05 08/08/16  ([email or web address])    Enterobacter cloacae complex NOT DETECTED NOT DETECTED Final    Escherichia coli NOT DETECTED NOT DETECTED Final   Klebsiella oxytoca NOT DETECTED NOT DETECTED Final   Klebsiella pneumoniae DETECTED (A) NOT DETECTED Final    Comment: CRITICAL RESULT CALLED TO, READ BACK BY AND VERIFIED WITH: Mila Merry.D. 11:05 08/08/16  ([email or web address])    Proteus species NOT DETECTED NOT DETECTED Final   Serratia marcescens NOT DETECTED NOT DETECTED Final   Carbapenem resistance NOT DETECTED NOT DETECTED Final   Haemophilus influenzae NOT DETECTED NOT DETECTED Final   Neisseria meningitidis NOT DETECTED NOT DETECTED Final   Pseudomonas aeruginosa NOT DETECTED NOT DETECTED Final   Candida albicans NOT DETECTED NOT DETECTED Final   Candida glabrata NOT DETECTED NOT DETECTED Final   Candida krusei NOT DETECTED NOT DETECTED Final   Candida parapsilosis NOT DETECTED NOT DETECTED Final   Candida tropicalis NOT DETECTED NOT DETECTED Final  Urine culture     Status: Abnormal   Collection Time: 08/07/16  7:50 PM  Result Value Ref Range Status   Specimen Description URINE, CLEAN CATCH  Final   Special Requests NONE  Final   Culture MULTIPLE SPECIES PRESENT, SUGGEST RECOLLECTION (A)  Final   Report Status 08/09/2016 FINAL  Final  MRSA PCR Screening     Status: None   Collection Time: 08/08/16 11:59 AM  Result Value Ref Range Status   MRSA by PCR NEGATIVE NEGATIVE Final    Comment:        The GeneXpert MRSA Assay (FDA approved for NASAL specimens only), is one component of a comprehensive MRSA colonization surveillance program. It is not intended to diagnose MRSA infection nor to guide or monitor treatment for MRSA infections.   Culture, blood (routine x 2)     Status: None (Preliminary result)   Collection Time: 08/09/16  2:08 PM  Result Value Ref Range Status   Specimen Description BLOOD LEFT ARM  Final   Special Requests BOTTLES DRAWN AEROBIC AND ANAEROBIC 10CC  Final   Culture   Final    NO GROWTH < 24 HOURS Performed at [facility]    Report Status  PENDING  Incomplete    Michel Bickers, MD [facility] for Infectious [place] [facility] [phone] pager   [phone] cell 08/10/2016, 4:36 PM

## 2016-08-10 NOTE — Progress Notes (Signed)
Date:  August 10, 2016 Chart reviewed for concurrent status and case management needs. Will continue to follow the patient for status change: Discharge Planning: following for needs Expected discharge date: [id number] Rhonda Davis, BSN, RN3, CCM   [phone] 

## 2016-08-10 NOTE — Telephone Encounter (Signed)
Cancelled breast MRI per Dr Jana Hakim

## 2016-08-11 ENCOUNTER — Telehealth: Payer: Self-pay

## 2016-08-11 DIAGNOSIS — C50919 Malignant neoplasm of unspecified site of unspecified female breast: Secondary | ICD-10-CM

## 2016-08-11 MED ORDER — DOXYCYCLINE HYCLATE 100 MG PO CAPS: 100.0000 mg | ORAL_CAPSULE | Freq: Two times a day (BID) | ORAL | 0 refills | Status: DC

## 2016-08-11 MED ORDER — BUTALBITAL-APAP-CAFFEINE 50-325-40 MG PO TABS: 2.0000 | ORAL_TABLET | Freq: Four times a day (QID) | ORAL | 0 refills | Status: DC | PRN

## 2016-08-11 MED ORDER — LEVOFLOXACIN 500 MG PO TABS: 500.0000 mg | ORAL_TABLET | Freq: Two times a day (BID) | ORAL | 0 refills | Status: DC

## 2016-08-11 MED ORDER — BUTALBITAL-APAP-CAFFEINE 50-325-40 MG PO TABS
2.0000 | ORAL_TABLET | Freq: Four times a day (QID) | ORAL | 0 refills | Status: DC | PRN
Start: 2016-08-11 — End: 2016-08-11

## 2016-08-11 NOTE — Telephone Encounter (Signed)
[facility] outpatient pharmacy called requesting Dr Jana Hakim rewrite rx from hospitalist. This way pt can use her financial assistance. S/w Dr Jana Hakim and rewrote rx for levaquin doxycycline and fioricet

## 2016-08-12 LAB — CULTURE, BLOOD (ROUTINE X 2): Culture: NO GROWTH

## 2016-08-13 ENCOUNTER — Other Ambulatory Visit: Payer: No Typology Code available for payment source

## 2016-08-14 ENCOUNTER — Encounter: Payer: Self-pay | Admitting: *Deleted

## 2016-08-14 ENCOUNTER — Other Ambulatory Visit (HOSPITAL_BASED_OUTPATIENT_CLINIC_OR_DEPARTMENT_OTHER): Payer: Medicaid Other

## 2016-08-14 ENCOUNTER — Ambulatory Visit (HOSPITAL_BASED_OUTPATIENT_CLINIC_OR_DEPARTMENT_OTHER): Payer: Medicaid Other

## 2016-08-14 ENCOUNTER — Telehealth: Payer: Self-pay | Admitting: Oncology

## 2016-08-14 ENCOUNTER — Ambulatory Visit: Payer: Self-pay

## 2016-08-14 ENCOUNTER — Other Ambulatory Visit: Payer: Self-pay | Admitting: Oncology

## 2016-08-14 ENCOUNTER — Other Ambulatory Visit: Payer: Self-pay

## 2016-08-14 ENCOUNTER — Ambulatory Visit (HOSPITAL_BASED_OUTPATIENT_CLINIC_OR_DEPARTMENT_OTHER): Payer: Medicaid Other | Admitting: Oncology

## 2016-08-14 VITALS — BP 153/98 | HR 99 | Temp 98.7°F | Resp 18 | Ht 64.0 in | Wt 219.8 lb

## 2016-08-14 DIAGNOSIS — C773 Secondary and unspecified malignant neoplasm of axilla and upper limb lymph nodes: Secondary | ICD-10-CM | POA: Diagnosis not present

## 2016-08-14 DIAGNOSIS — C50411 Malignant neoplasm of upper-outer quadrant of right female breast: Secondary | ICD-10-CM

## 2016-08-14 DIAGNOSIS — Z95828 Presence of other vascular implants and grafts: Secondary | ICD-10-CM | POA: Insufficient documentation

## 2016-08-14 DIAGNOSIS — R5081 Fever presenting with conditions classified elsewhere: Secondary | ICD-10-CM | POA: Diagnosis not present

## 2016-08-14 DIAGNOSIS — D709 Neutropenia, unspecified: Secondary | ICD-10-CM

## 2016-08-14 DIAGNOSIS — C50919 Malignant neoplasm of unspecified site of unspecified female breast: Secondary | ICD-10-CM

## 2016-08-14 DIAGNOSIS — C50911 Malignant neoplasm of unspecified site of right female breast: Secondary | ICD-10-CM

## 2016-08-14 LAB — CBC WITH DIFFERENTIAL/PLATELET
BASO%: 0.6 % (ref 0.0–2.0)
Basophils Absolute: 0 10*3/uL (ref 0.0–0.1)
EOS ABS: 0 10*3/uL (ref 0.0–0.5)
EOS%: 0 % (ref 0.0–7.0)
HCT: 33.4 % — ABNORMAL LOW (ref 34.8–46.6)
HGB: 11.4 g/dL — ABNORMAL LOW (ref 11.6–15.9)
LYMPH%: 9.1 % — AB (ref 14.0–49.7)
MCH: 31.6 pg (ref 25.1–34.0)
MCHC: 34.1 g/dL (ref 31.5–36.0)
MCV: 92.6 fL (ref 79.5–101.0)
MONO#: 0.9 10*3/uL (ref 0.1–0.9)
MONO%: 13.3 % (ref 0.0–14.0)
NEUT#: 5 10*3/uL (ref 1.5–6.5)
NEUT%: 77 % — AB (ref 38.4–76.8)
PLATELETS: 340 10*3/uL (ref 145–400)
RBC: 3.61 10*6/uL — AB (ref 3.70–5.45)
RDW: 13.8 % (ref 11.2–14.5)
WBC: 6.5 10*3/uL (ref 3.9–10.3)
lymph#: 0.6 10*3/uL — ABNORMAL LOW (ref 0.9–3.3)

## 2016-08-14 LAB — CULTURE, BLOOD (ROUTINE X 2): Culture: NO GROWTH

## 2016-08-14 LAB — COMPREHENSIVE METABOLIC PANEL
ALT: 21 U/L (ref 0–55)
ANION GAP: 9 meq/L (ref 3–11)
AST: 13 U/L (ref 5–34)
Albumin: 3.1 g/dL — ABNORMAL LOW (ref 3.5–5.0)
Alkaline Phosphatase: 115 U/L (ref 40–150)
BUN: 9 mg/dL (ref 7.0–26.0)
CHLORIDE: 107 meq/L (ref 98–109)
CO2: 23 meq/L (ref 22–29)
Calcium: 9.6 mg/dL (ref 8.4–10.4)
Creatinine: 0.8 mg/dL (ref 0.6–1.1)
EGFR: 87 mL/min/{1.73_m2} — AB (ref >90)
Glucose: 83 mg/dl (ref 70–140)
POTASSIUM: 3.8 meq/L (ref 3.5–5.1)
Sodium: 138 mEq/L (ref 136–145)
TOTAL PROTEIN: 7 g/dL (ref 6.4–8.3)

## 2016-08-14 MED ORDER — SODIUM CHLORIDE 0.9 % IJ SOLN
10.0000 mL | INTRAMUSCULAR | Status: DC | PRN
  Administered 2016-08-14: 10 mL via INTRAVENOUS
  Filled 2016-08-14: qty 10

## 2016-08-14 MED ORDER — HEPARIN SOD (PORK) LOCK FLUSH 100 UNIT/ML IV SOLN
500.0000 [IU] | Freq: Once | INTRAVENOUS | Status: AC | PRN
  Administered 2016-08-14: 500 [IU] via INTRAVENOUS
  Filled 2016-08-14: qty 5
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
## 2016-08-14 NOTE — Telephone Encounter (Signed)
appt made and avs printed °

## 2016-08-14 NOTE — Patient Instructions (Signed)

## 2016-08-17 ENCOUNTER — Ambulatory Visit: Payer: No Typology Code available for payment source | Admitting: Oncology

## 2016-08-20 LAB — CULTURE, BLOOD (SINGLE)

## 2016-08-21 ENCOUNTER — Ambulatory Visit (HOSPITAL_COMMUNITY)
Admission: RE | Admit: 2016-08-21 | Discharge: 2016-08-21 | Disposition: A | Discharge status: Home / Self Care | Payer: Medicaid Other | Source: Ambulatory Visit | Attending: Internal Medicine | Admitting: Internal Medicine

## 2016-08-21 ENCOUNTER — Telehealth: Payer: Self-pay | Admitting: *Deleted

## 2016-08-21 ENCOUNTER — Encounter (HOSPITAL_COMMUNITY): Payer: Self-pay

## 2016-08-21 DIAGNOSIS — C50411 Malignant neoplasm of upper-outer quadrant of right female breast: Secondary | ICD-10-CM

## 2016-08-21 NOTE — Telephone Encounter (Signed)
Received call from [facility] stating pt is there for ECHO but had one 9/6 in the hosp & she would like to clarify if pt still needs.  Discussed with Dr Jana Hakim & verbal order given to cancel ECHO.

## 2016-08-23 ENCOUNTER — Ambulatory Visit (HOSPITAL_BASED_OUTPATIENT_CLINIC_OR_DEPARTMENT_OTHER): Payer: Medicaid Other

## 2016-08-23 ENCOUNTER — Other Ambulatory Visit (HOSPITAL_BASED_OUTPATIENT_CLINIC_OR_DEPARTMENT_OTHER): Payer: Medicaid Other

## 2016-08-23 ENCOUNTER — Ambulatory Visit: Payer: Self-pay

## 2016-08-23 VITALS — BP 136/80 | HR 87 | Temp 98.5°F | Resp 17

## 2016-08-23 DIAGNOSIS — C773 Secondary and unspecified malignant neoplasm of axilla and upper limb lymph nodes: Secondary | ICD-10-CM | POA: Diagnosis not present

## 2016-08-23 DIAGNOSIS — R5081 Fever presenting with conditions classified elsewhere: Secondary | ICD-10-CM

## 2016-08-23 DIAGNOSIS — D709 Neutropenia, unspecified: Secondary | ICD-10-CM

## 2016-08-23 DIAGNOSIS — C50919 Malignant neoplasm of unspecified site of unspecified female breast: Secondary | ICD-10-CM

## 2016-08-23 DIAGNOSIS — C50411 Malignant neoplasm of upper-outer quadrant of right female breast: Secondary | ICD-10-CM

## 2016-08-23 DIAGNOSIS — Z95828 Presence of other vascular implants and grafts: Secondary | ICD-10-CM

## 2016-08-23 DIAGNOSIS — Z5111 Encounter for antineoplastic chemotherapy: Secondary | ICD-10-CM

## 2016-08-23 LAB — CBC WITH DIFFERENTIAL/PLATELET
BASO%: 1.4 % (ref 0.0–2.0)
BASOS ABS: 0.1 10*3/uL (ref 0.0–0.1)
EOS ABS: 0 10*3/uL (ref 0.0–0.5)
EOS%: 0.1 % (ref 0.0–7.0)
HEMATOCRIT: 32.4 % — AB (ref 34.8–46.6)
HGB: 10.9 g/dL — ABNORMAL LOW (ref 11.6–15.9)
LYMPH%: 9.5 % — ABNORMAL LOW (ref 14.0–49.7)
MCH: 32.4 pg (ref 25.1–34.0)
MCHC: 33.8 g/dL (ref 31.5–36.0)
MCV: 95.8 fL (ref 79.5–101.0)
MONO#: 0.6 10*3/uL (ref 0.1–0.9)
MONO%: 12.2 % (ref 0.0–14.0)
NEUT#: 4 10*3/uL (ref 1.5–6.5)
NEUT%: 76.8 % (ref 38.4–76.8)
PLATELETS: 470 10*3/uL — AB (ref 145–400)
RBC: 3.38 10*6/uL — ABNORMAL LOW (ref 3.70–5.45)
RDW: 15.8 % — ABNORMAL HIGH (ref 11.2–14.5)
WBC: 5.2 10*3/uL (ref 3.9–10.3)
lymph#: 0.5 10*3/uL — ABNORMAL LOW (ref 0.9–3.3)

## 2016-08-23 LAB — COMPREHENSIVE METABOLIC PANEL
ALK PHOS: 112 U/L (ref 40–150)
ALT: 16 U/L (ref 0–55)
AST: 14 U/L (ref 5–34)
Albumin: 3.1 g/dL — ABNORMAL LOW (ref 3.5–5.0)
Anion Gap: 8 mEq/L (ref 3–11)
BILIRUBIN TOTAL: 0.21 mg/dL (ref 0.20–1.20)
BUN: 5.7 mg/dL — ABNORMAL LOW (ref 7.0–26.0)
CALCIUM: 9.4 mg/dL (ref 8.4–10.4)
CO2: 24 mEq/L (ref 22–29)
CREATININE: 0.7 mg/dL (ref 0.6–1.1)
Chloride: 107 mEq/L (ref 98–109)
EGFR: >90 mL/min/{1.73_m2} (ref >90)
Glucose: 84 mg/dl (ref 70–140)
Potassium: 4.2 mEq/L (ref 3.5–5.1)
Sodium: 140 mEq/L (ref 136–145)
TOTAL PROTEIN: 6.7 g/dL (ref 6.4–8.3)

## 2016-08-23 MED ORDER — SODIUM CHLORIDE 0.9 % IJ SOLN
10.0000 mL | INTRAMUSCULAR | Status: DC | PRN
  Administered 2016-08-23: 10 mL via INTRAVENOUS
  Filled 2016-08-23: qty 10

## 2016-08-23 MED ORDER — DIPHENHYDRAMINE HCL 50 MG/ML IJ SOLN
50.0000 mg | Freq: Once | INTRAMUSCULAR | Status: AC
  Administered 2016-08-23: 50 mg via INTRAVENOUS

## 2016-08-23 MED ORDER — FAMOTIDINE IN NACL 20-0.9 MG/50ML-% IV SOLN
20.0000 mg | Freq: Once | INTRAVENOUS | Status: AC
  Administered 2016-08-23: 20 mg via INTRAVENOUS

## 2016-08-23 MED ORDER — SODIUM CHLORIDE 0.9 % IV SOLN
80.0000 mg/m2 | Freq: Once | INTRAVENOUS | Status: AC
  Administered 2016-08-23: 174 mg via INTRAVENOUS
  Filled 2016-08-23: qty 29

## 2016-08-23 MED ORDER — FAMOTIDINE IN NACL 20-0.9 MG/50ML-% IV SOLN
INTRAVENOUS | Status: AC
  Filled 2016-08-23: qty 50

## 2016-08-23 MED ORDER — SODIUM CHLORIDE 0.9 % IV SOLN
Freq: Once | INTRAVENOUS | Status: AC
  Administered 2016-08-23: 10:00:00 via INTRAVENOUS

## 2016-08-23 MED ORDER — DEXAMETHASONE SODIUM PHOSPHATE 100 MG/10ML IJ SOLN
20.0000 mg | Freq: Once | INTRAMUSCULAR | Status: AC
  Administered 2016-08-23: 20 mg via INTRAVENOUS
  Filled 2016-08-23: qty 2

## 2016-08-23 MED ORDER — DIPHENHYDRAMINE HCL 50 MG/ML IJ SOLN
INTRAMUSCULAR | Status: AC
  Filled 2016-08-23: qty 1

## 2016-08-23 NOTE — Patient Instructions (Signed)

## 2016-08-23 NOTE — Patient Instructions (Signed)
Paclitaxel injection What is this medicine? PACLITAXEL (PAK li TAX el) is a chemotherapy drug. It targets fast dividing cells, like cancer cells, and causes these cells to die. This medicine is used to treat ovarian cancer, breast cancer, and other cancers. This medicine may be used for other purposes; ask your health care provider or pharmacist if you have questions. What should I tell my health care provider before I take this medicine? They need to know if you have any of these conditions: -blood disorders -irregular heartbeat -infection (especially a virus infection such as chickenpox, cold sores, or herpes) -liver disease -previous or ongoing radiation therapy -an unusual or allergic reaction to paclitaxel, alcohol, polyoxyethylated castor oil, other chemotherapy agents, other medicines, foods, dyes, or preservatives -pregnant or trying to get pregnant -breast-feeding How should I use this medicine? This drug is given as an infusion into a vein. It is administered in a hospital or clinic by a specially trained health care professional. Talk to your pediatrician regarding the use of this medicine in children. Special care may be needed. Overdosage: If you think you have taken too much of this medicine contact a poison control center or emergency room at once. NOTE: This medicine is only for you. Do not share this medicine with others. What if I miss a dose? It is important not to miss your dose. Call your doctor or health care professional if you are unable to keep an appointment. What may interact with this medicine? Do not take this medicine with any of the following medications: -disulfiram -metronidazole This medicine may also interact with the following medications: -cyclosporine -diazepam -ketoconazole -medicines to increase blood counts like filgrastim, pegfilgrastim, sargramostim -other chemotherapy drugs like cisplatin, doxorubicin, epirubicin, etoposide, teniposide,  vincristine -quinidine -testosterone -vaccines -verapamil Talk to your doctor or health care professional before taking any of these medicines: -acetaminophen -aspirin -ibuprofen -ketoprofen -naproxen This list may not describe all possible interactions. Give your health care provider a list of all the medicines, herbs, non-prescription drugs, or dietary supplements you use. Also tell them if you smoke, drink alcohol, or use illegal drugs. Some items may interact with your medicine. What should I watch for while using this medicine? Your condition will be monitored carefully while you are receiving this medicine. You will need important blood work done while you are taking this medicine. This drug may make you feel generally unwell. This is not uncommon, as chemotherapy can affect healthy cells as well as cancer cells. Report any side effects. Continue your course of treatment even though you feel ill unless your doctor tells you to stop. This medicine can cause serious allergic reactions. To reduce your risk you will need to take other medicine(s) before treatment with this medicine. In some cases, you may be given additional medicines to help with side effects. Follow all directions for their use. Call your doctor or health care professional for advice if you get a fever, chills or sore throat, or other symptoms of a cold or flu. Do not treat yourself. This drug decreases your body's ability to fight infections. Try to avoid being around people who are sick. This medicine may increase your risk to bruise or bleed. Call your doctor or health care professional if you notice any unusual bleeding. Be careful brushing and flossing your teeth or using a toothpick because you may get an infection or bleed more easily. If you have any dental work done, tell your dentist you are receiving this medicine. Avoid taking products that   contain aspirin, acetaminophen, ibuprofen, naproxen, or ketoprofen unless  instructed by your doctor. These medicines may hide a fever. Do not become pregnant while taking this medicine. Women should inform their doctor if they wish to become pregnant or think they might be pregnant. There is a potential for serious side effects to an unborn child. Talk to your health care professional or pharmacist for more information. Do not breast-feed an infant while taking this medicine. Men are advised not to father a child while receiving this medicine. This product may contain alcohol. Ask your pharmacist or healthcare provider if this medicine contains alcohol. Be sure to tell all healthcare providers you are taking this medicine. Certain medicines, like metronidazole and disulfiram, can cause an unpleasant reaction when taken with alcohol. The reaction includes flushing, headache, nausea, vomiting, sweating, and increased thirst. The reaction can last from 30 minutes to several hours. What side effects may I notice from receiving this medicine? Side effects that you should report to your doctor or health care professional as soon as possible: -allergic reactions like skin rash, itching or hives, swelling of the face, lips, or tongue -low blood counts - This drug may decrease the number of white blood cells, red blood cells and platelets. You may be at increased risk for infections and bleeding. -signs of infection - fever or chills, cough, sore throat, pain or difficulty passing urine -signs of decreased platelets or bleeding - bruising, pinpoint red spots on the skin, black, tarry stools, nosebleeds -signs of decreased red blood cells - unusually weak or tired, fainting spells, lightheadedness -breathing problems -chest pain -high or low blood pressure -mouth sores -nausea and vomiting -pain, swelling, redness or irritation at the injection site -pain, tingling, numbness in the hands or feet -slow or irregular heartbeat -swelling of the ankle, feet, hands Side effects that  usually do not require medical attention (report to your doctor or health care professional if they continue or are bothersome): -bone pain -complete hair loss including hair on your head, underarms, pubic hair, eyebrows, and eyelashes -changes in the color of fingernails -diarrhea -loosening of the fingernails -loss of appetite -muscle or joint pain -red flush to skin -sweating This list may not describe all possible side effects. Call your doctor for medical advice about side effects. You may report side effects to FDA at 1-800-FDA-1088. Where should I keep my medicine? This drug is given in a hospital or clinic and will not be stored at home. NOTE: This sheet is a summary. It may not cover all possible information. If you have questions about this medicine, talk to your doctor, pharmacist, or health care provider.    2016, Elsevier/Gold Standard. (2015-07-08 13:02:56)  

## 2016-08-28 ENCOUNTER — Ambulatory Visit: Payer: No Typology Code available for payment source

## 2016-08-28 ENCOUNTER — Other Ambulatory Visit: Payer: No Typology Code available for payment source

## 2016-08-29 LAB — CULTURE, BLOOD (SINGLE)

## 2016-08-30 ENCOUNTER — Ambulatory Visit (HOSPITAL_BASED_OUTPATIENT_CLINIC_OR_DEPARTMENT_OTHER): Payer: Medicaid Other | Admitting: Oncology

## 2016-08-30 ENCOUNTER — Encounter (HOSPITAL_COMMUNITY): Payer: No Typology Code available for payment source | Admitting: Internal Medicine

## 2016-08-30 ENCOUNTER — Ambulatory Visit: Payer: No Typology Code available for payment source | Admitting: Oncology

## 2016-08-30 ENCOUNTER — Ambulatory Visit (HOSPITAL_BASED_OUTPATIENT_CLINIC_OR_DEPARTMENT_OTHER): Payer: Medicaid Other

## 2016-08-30 ENCOUNTER — Other Ambulatory Visit (HOSPITAL_BASED_OUTPATIENT_CLINIC_OR_DEPARTMENT_OTHER): Payer: Medicaid Other

## 2016-08-30 VITALS — BP 140/91 | HR 88 | Temp 98.0°F | Resp 18 | Ht 64.0 in | Wt 218.9 lb

## 2016-08-30 DIAGNOSIS — C773 Secondary and unspecified malignant neoplasm of axilla and upper limb lymph nodes: Secondary | ICD-10-CM | POA: Diagnosis not present

## 2016-08-30 DIAGNOSIS — C50411 Malignant neoplasm of upper-outer quadrant of right female breast: Secondary | ICD-10-CM

## 2016-08-30 DIAGNOSIS — C50919 Malignant neoplasm of unspecified site of unspecified female breast: Secondary | ICD-10-CM

## 2016-08-30 DIAGNOSIS — Z5111 Encounter for antineoplastic chemotherapy: Secondary | ICD-10-CM

## 2016-08-30 DIAGNOSIS — R5081 Fever presenting with conditions classified elsewhere: Principal | ICD-10-CM

## 2016-08-30 DIAGNOSIS — C50911 Malignant neoplasm of unspecified site of right female breast: Secondary | ICD-10-CM

## 2016-08-30 DIAGNOSIS — D709 Neutropenia, unspecified: Secondary | ICD-10-CM

## 2016-08-30 LAB — CBC WITH DIFFERENTIAL/PLATELET
BASO%: 1.9 % (ref 0.0–2.0)
BASOS ABS: 0.1 10*3/uL (ref 0.0–0.1)
EOS ABS: 0.1 10*3/uL (ref 0.0–0.5)
EOS%: 1.7 % (ref 0.0–7.0)
HCT: 34.5 % — ABNORMAL LOW (ref 34.8–46.6)
HEMOGLOBIN: 11.5 g/dL — AB (ref 11.6–15.9)
LYMPH%: 11.5 % — AB (ref 14.0–49.7)
MCH: 32.1 pg (ref 25.1–34.0)
MCHC: 33.4 g/dL (ref 31.5–36.0)
MCV: 96.2 fL (ref 79.5–101.0)
MONO#: 0.4 10*3/uL (ref 0.1–0.9)
MONO%: 8.4 % (ref 0.0–14.0)
NEUT%: 76.5 % (ref 38.4–76.8)
NEUTROS ABS: 3.9 10*3/uL (ref 1.5–6.5)
PLATELETS: 424 10*3/uL — AB (ref 145–400)
RBC: 3.59 10*6/uL — AB (ref 3.70–5.45)
RDW: 16.1 % — ABNORMAL HIGH (ref 11.2–14.5)
WBC: 5.1 10*3/uL (ref 3.9–10.3)
lymph#: 0.6 10*3/uL — ABNORMAL LOW (ref 0.9–3.3)

## 2016-08-30 LAB — COMPREHENSIVE METABOLIC PANEL
ALBUMIN: 3.4 g/dL — AB (ref 3.5–5.0)
ALK PHOS: 118 U/L (ref 40–150)
ALT: 22 U/L (ref 0–55)
ANION GAP: 11 meq/L (ref 3–11)
AST: 19 U/L (ref 5–34)
BILIRUBIN TOTAL: 0.37 mg/dL (ref 0.20–1.20)
BUN: 10 mg/dL (ref 7.0–26.0)
CO2: 23 mEq/L (ref 22–29)
Calcium: 9.6 mg/dL (ref 8.4–10.4)
Chloride: 105 mEq/L (ref 98–109)
Creatinine: 0.8 mg/dL (ref 0.6–1.1)
EGFR: 85 mL/min/{1.73_m2} — AB (ref >90)
Glucose: 82 mg/dl (ref 70–140)
Potassium: 4.1 mEq/L (ref 3.5–5.1)
Sodium: 139 mEq/L (ref 136–145)
TOTAL PROTEIN: 7.3 g/dL (ref 6.4–8.3)

## 2016-08-30 MED ORDER — SODIUM CHLORIDE 0.9 % IV SOLN
80.0000 mg/m2 | Freq: Once | INTRAVENOUS | Status: AC
  Administered 2016-08-30: 174 mg via INTRAVENOUS
  Filled 2016-08-30: qty 29

## 2016-08-30 MED ORDER — DIPHENHYDRAMINE HCL 50 MG/ML IJ SOLN
INTRAMUSCULAR | Status: AC
  Filled 2016-08-30: qty 1

## 2016-08-30 MED ORDER — ALTEPLASE 2 MG IJ SOLR
2.0000 mg | Freq: Once | INTRAMUSCULAR | Status: AC | PRN
  Administered 2016-08-30: 2 mg
  Filled 2016-08-30: qty 2

## 2016-08-30 MED ORDER — SODIUM CHLORIDE 0.9% FLUSH
10.0000 mL | INTRAVENOUS | Status: DC | PRN
  Administered 2016-08-30: 10 mL
  Filled 2016-08-30: qty 10

## 2016-08-30 MED ORDER — DIPHENHYDRAMINE HCL 50 MG/ML IJ SOLN
50.0000 mg | Freq: Once | INTRAMUSCULAR | Status: AC
  Administered 2016-08-30: 50 mg via INTRAVENOUS

## 2016-08-30 MED ORDER — SODIUM CHLORIDE 0.9 % IV SOLN
Freq: Once | INTRAVENOUS | Status: AC
  Administered 2016-08-30: 12:00:00 via INTRAVENOUS

## 2016-08-30 MED ORDER — HEPARIN SOD (PORK) LOCK FLUSH 100 UNIT/ML IV SOLN
500.0000 [IU] | Freq: Once | INTRAVENOUS | Status: AC | PRN
  Administered 2016-08-30: 500 [IU]
  Filled 2016-08-30: qty 5

## 2016-08-30 MED ORDER — FAMOTIDINE IN NACL 20-0.9 MG/50ML-% IV SOLN
20.0000 mg | Freq: Once | INTRAVENOUS | Status: AC
  Administered 2016-08-30: 20 mg via INTRAVENOUS

## 2016-08-30 MED ORDER — FAMOTIDINE IN NACL 20-0.9 MG/50ML-% IV SOLN
INTRAVENOUS | Status: AC
  Filled 2016-08-30: qty 50

## 2016-08-30 MED ORDER — SODIUM CHLORIDE 0.9 % IV SOLN
4.0000 mg | Freq: Once | INTRAVENOUS | Status: AC
  Administered 2016-08-30: 4 mg via INTRAVENOUS
  Filled 2016-08-30: qty 0.4

## 2016-08-30 MED ORDER — DEXAMETHASONE 2 MG PO TABS: ORAL_TABLET | ORAL | 2 refills | Status: DC
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
## 2016-09-04 ENCOUNTER — Ambulatory Visit: Payer: No Typology Code available for payment source

## 2016-09-04 ENCOUNTER — Other Ambulatory Visit: Payer: No Typology Code available for payment source

## 2016-09-05 ENCOUNTER — Ambulatory Visit: Payer: No Typology Code available for payment source | Admitting: Oncology

## 2016-09-06 ENCOUNTER — Ambulatory Visit (HOSPITAL_BASED_OUTPATIENT_CLINIC_OR_DEPARTMENT_OTHER): Payer: Medicaid Other

## 2016-09-06 ENCOUNTER — Ambulatory Visit: Payer: Medicaid Other

## 2016-09-06 ENCOUNTER — Other Ambulatory Visit (HOSPITAL_BASED_OUTPATIENT_CLINIC_OR_DEPARTMENT_OTHER): Payer: Medicaid Other

## 2016-09-06 ENCOUNTER — Other Ambulatory Visit: Payer: Self-pay | Admitting: Oncology

## 2016-09-06 VITALS — BP 136/81 | HR 81 | Temp 98.7°F | Resp 18

## 2016-09-06 DIAGNOSIS — C50411 Malignant neoplasm of upper-outer quadrant of right female breast: Secondary | ICD-10-CM

## 2016-09-06 DIAGNOSIS — Z5189 Encounter for other specified aftercare: Secondary | ICD-10-CM | POA: Diagnosis not present

## 2016-09-06 DIAGNOSIS — Z5111 Encounter for antineoplastic chemotherapy: Secondary | ICD-10-CM | POA: Diagnosis present

## 2016-09-06 DIAGNOSIS — C773 Secondary and unspecified malignant neoplasm of axilla and upper limb lymph nodes: Secondary | ICD-10-CM | POA: Diagnosis not present

## 2016-09-06 DIAGNOSIS — R5081 Fever presenting with conditions classified elsewhere: Principal | ICD-10-CM

## 2016-09-06 DIAGNOSIS — D709 Neutropenia, unspecified: Secondary | ICD-10-CM

## 2016-09-06 DIAGNOSIS — C50919 Malignant neoplasm of unspecified site of unspecified female breast: Secondary | ICD-10-CM

## 2016-09-06 DIAGNOSIS — Z95828 Presence of other vascular implants and grafts: Secondary | ICD-10-CM

## 2016-09-06 LAB — CBC WITH DIFFERENTIAL/PLATELET
BASO%: 1.9 % (ref 0.0–2.0)
BASOS ABS: 0.1 10*3/uL (ref 0.0–0.1)
EOS%: 4.7 % (ref 0.0–7.0)
Eosinophils Absolute: 0.2 10*3/uL (ref 0.0–0.5)
HEMATOCRIT: 32.7 % — AB (ref 34.8–46.6)
HGB: 10.8 g/dL — ABNORMAL LOW (ref 11.6–15.9)
LYMPH#: 0.7 10*3/uL — AB (ref 0.9–3.3)
LYMPH%: 16.8 % (ref 14.0–49.7)
MCH: 32.3 pg (ref 25.1–34.0)
MCHC: 33 g/dL (ref 31.5–36.0)
MCV: 97.8 fL (ref 79.5–101.0)
MONO#: 0.4 10*3/uL (ref 0.1–0.9)
MONO%: 10.2 % (ref 0.0–14.0)
NEUT#: 2.7 10*3/uL (ref 1.5–6.5)
NEUT%: 66.4 % (ref 38.4–76.8)
PLATELETS: 242 10*3/uL (ref 145–400)
RBC: 3.34 10*6/uL — AB (ref 3.70–5.45)
RDW: 16.8 % — AB (ref 11.2–14.5)
WBC: 4.1 10*3/uL (ref 3.9–10.3)

## 2016-09-06 LAB — COMPREHENSIVE METABOLIC PANEL
ALT: 14 U/L (ref 0–55)
ANION GAP: 9 meq/L (ref 3–11)
AST: 13 U/L (ref 5–34)
Albumin: 3.2 g/dL — ABNORMAL LOW (ref 3.5–5.0)
Alkaline Phosphatase: 91 U/L (ref 40–150)
BUN: 11.3 mg/dL (ref 7.0–26.0)
CALCIUM: 9.2 mg/dL (ref 8.4–10.4)
CHLORIDE: 107 meq/L (ref 98–109)
CO2: 24 meq/L (ref 22–29)
CREATININE: 0.8 mg/dL (ref 0.6–1.1)
Glucose: 60 mg/dl — ABNORMAL LOW (ref 70–140)
Potassium: 3.3 mEq/L — ABNORMAL LOW (ref 3.5–5.1)
Sodium: 140 mEq/L (ref 136–145)
TOTAL PROTEIN: 6.3 g/dL — AB (ref 6.4–8.3)

## 2016-09-06 MED ORDER — SODIUM CHLORIDE 0.9 % IV SOLN
80.0000 mg/m2 | Freq: Once | INTRAVENOUS | Status: AC
  Administered 2016-09-06: 174 mg via INTRAVENOUS
  Filled 2016-09-06: qty 29

## 2016-09-06 MED ORDER — SODIUM CHLORIDE 0.9 % IV SOLN
4.0000 mg | Freq: Once | INTRAVENOUS | Status: AC
  Administered 2016-09-06: 4 mg via INTRAVENOUS
  Filled 2016-09-06: qty 0.4

## 2016-09-06 MED ORDER — FAMOTIDINE IN NACL 20-0.9 MG/50ML-% IV SOLN
20.0000 mg | Freq: Once | INTRAVENOUS | Status: AC
  Administered 2016-09-06: 20 mg via INTRAVENOUS

## 2016-09-06 MED ORDER — SODIUM CHLORIDE 0.9 % IJ SOLN
10.0000 mL | INTRAMUSCULAR | Status: DC | PRN
  Administered 2016-09-06: 10 mL via INTRAVENOUS
  Filled 2016-09-06: qty 10

## 2016-09-06 MED ORDER — ALTEPLASE 2 MG IJ SOLR
2.0000 mg | Freq: Once | INTRAMUSCULAR | Status: AC | PRN
  Administered 2016-09-06: 2 mg
  Filled 2016-09-06: qty 2

## 2016-09-06 MED ORDER — DIPHENHYDRAMINE HCL 50 MG/ML IJ SOLN
50.0000 mg | Freq: Once | INTRAMUSCULAR | Status: AC
Start: 2016-09-06 — End: 2016-09-06
  Administered 2016-09-06: 50 mg via INTRAVENOUS

## 2016-09-06 MED ORDER — DIPHENHYDRAMINE HCL 50 MG/ML IJ SOLN
INTRAMUSCULAR | Status: AC
  Filled 2016-09-06: qty 1

## 2016-09-06 MED ORDER — FAMOTIDINE IN NACL 20-0.9 MG/50ML-% IV SOLN
INTRAVENOUS | Status: AC
  Filled 2016-09-06: qty 50

## 2016-09-06 MED ORDER — SODIUM CHLORIDE 0.9% FLUSH
10.0000 mL | INTRAVENOUS | Status: DC | PRN
  Administered 2016-09-06: 10 mL
  Filled 2016-09-06: qty 10

## 2016-09-06 MED ORDER — SODIUM CHLORIDE 0.9 % IV SOLN
Freq: Once | INTRAVENOUS | Status: AC
  Administered 2016-09-06: 12:00:00 via INTRAVENOUS

## 2016-09-06 MED ORDER — HEPARIN SOD (PORK) LOCK FLUSH 100 UNIT/ML IV SOLN
500.0000 [IU] | Freq: Once | INTRAVENOUS | Status: AC | PRN
  Administered 2016-09-06: 500 [IU]
  Filled 2016-09-06: qty 5

## 2016-09-06 NOTE — Progress Notes (Signed)
TPA administered at 0930 by Jovita Gamma RN. (See MAR). Checked for blood return at 1020 and 1030 with no return. No return with patient holding breath, holding arm above head or putting arms behind back.

## 2016-09-06 NOTE — Progress Notes (Signed)
Could not get any blood return from pt port after flushing several times. Blood was drawn peripherally. Desk nurse called and TPN was released to put in port for infusion.

## 2016-09-06 NOTE — Patient Instructions (Signed)
[facility] Discharge Instructions for Patients Receiving Chemotherapy  Today you received the following chemotherapy agents Taxol   To help prevent nausea and vomiting after your treatment, we encourage you to take your nausea medication as directed.   If you develop nausea and vomiting that is not controlled by your nausea medication, call the clinic.   BELOW ARE SYMPTOMS THAT SHOULD BE REPORTED IMMEDIATELY:  *FEVER GREATER THAN 100.5 F  *CHILLS WITH OR WITHOUT FEVER  NAUSEA AND VOMITING THAT IS NOT CONTROLLED WITH YOUR NAUSEA MEDICATION  *UNUSUAL SHORTNESS OF BREATH  *UNUSUAL BRUISING OR BLEEDING  TENDERNESS IN MOUTH AND THROAT WITH OR WITHOUT PRESENCE OF ULCERS  *URINARY PROBLEMS  *BOWEL PROBLEMS  UNUSUAL RASH Items with * indicate a potential emergency and should be followed up as soon as possible.  Feel free to call the clinic you have any questions or concerns. The clinic phone number is [phone].  Please show the CHEMO ALERT CARD at check-in to the Emergency Department and triage nurse.   

## 2016-09-11 ENCOUNTER — Ambulatory Visit: Payer: No Typology Code available for payment source

## 2016-09-11 ENCOUNTER — Other Ambulatory Visit: Payer: No Typology Code available for payment source

## 2016-09-13 ENCOUNTER — Other Ambulatory Visit: Payer: No Typology Code available for payment source

## 2016-09-13 ENCOUNTER — Ambulatory Visit: Payer: No Typology Code available for payment source

## 2016-09-13 ENCOUNTER — Ambulatory Visit (HOSPITAL_BASED_OUTPATIENT_CLINIC_OR_DEPARTMENT_OTHER): Payer: Medicaid Other | Admitting: Oncology

## 2016-09-13 ENCOUNTER — Ambulatory Visit: Payer: Medicaid Other

## 2016-09-13 ENCOUNTER — Ambulatory Visit (HOSPITAL_BASED_OUTPATIENT_CLINIC_OR_DEPARTMENT_OTHER): Payer: Medicaid Other

## 2016-09-13 ENCOUNTER — Other Ambulatory Visit (HOSPITAL_BASED_OUTPATIENT_CLINIC_OR_DEPARTMENT_OTHER): Payer: Medicaid Other

## 2016-09-13 VITALS — BP 137/95 | HR 81 | Temp 98.0°F | Resp 18 | Ht 64.0 in | Wt 222.4 lb

## 2016-09-13 DIAGNOSIS — C50411 Malignant neoplasm of upper-outer quadrant of right female breast: Secondary | ICD-10-CM | POA: Diagnosis not present

## 2016-09-13 DIAGNOSIS — Z5111 Encounter for antineoplastic chemotherapy: Secondary | ICD-10-CM | POA: Diagnosis present

## 2016-09-13 DIAGNOSIS — D709 Neutropenia, unspecified: Secondary | ICD-10-CM

## 2016-09-13 DIAGNOSIS — C773 Secondary and unspecified malignant neoplasm of axilla and upper limb lymph nodes: Secondary | ICD-10-CM | POA: Diagnosis not present

## 2016-09-13 DIAGNOSIS — C50911 Malignant neoplasm of unspecified site of right female breast: Secondary | ICD-10-CM

## 2016-09-13 DIAGNOSIS — R5081 Fever presenting with conditions classified elsewhere: Principal | ICD-10-CM

## 2016-09-13 DIAGNOSIS — Z170 Estrogen receptor positive status [ER+]: Secondary | ICD-10-CM

## 2016-09-13 DIAGNOSIS — Z95828 Presence of other vascular implants and grafts: Secondary | ICD-10-CM

## 2016-09-13 DIAGNOSIS — C50919 Malignant neoplasm of unspecified site of unspecified female breast: Secondary | ICD-10-CM

## 2016-09-13 LAB — CBC WITH DIFFERENTIAL/PLATELET
BASO%: 1.1 % (ref 0.0–2.0)
Basophils Absolute: 0 10*3/uL (ref 0.0–0.1)
EOS%: 4.4 % (ref 0.0–7.0)
Eosinophils Absolute: 0.2 10*3/uL (ref 0.0–0.5)
HEMATOCRIT: 34.4 % — AB (ref 34.8–46.6)
HEMOGLOBIN: 11.6 g/dL (ref 11.6–15.9)
LYMPH#: 0.5 10*3/uL — AB (ref 0.9–3.3)
LYMPH%: 14.1 % (ref 14.0–49.7)
MCH: 32.9 pg (ref 25.1–34.0)
MCHC: 33.6 g/dL (ref 31.5–36.0)
MCV: 98.1 fL (ref 79.5–101.0)
MONO#: 0.3 10*3/uL (ref 0.1–0.9)
MONO%: 8 % (ref 0.0–14.0)
NEUT%: 72.4 % (ref 38.4–76.8)
NEUTROS ABS: 2.6 10*3/uL (ref 1.5–6.5)
Platelets: 221 10*3/uL (ref 145–400)
RBC: 3.51 10*6/uL — ABNORMAL LOW (ref 3.70–5.45)
RDW: 17 % — AB (ref 11.2–14.5)
WBC: 3.6 10*3/uL — AB (ref 3.9–10.3)

## 2016-09-13 LAB — COMPREHENSIVE METABOLIC PANEL
ALBUMIN: 3.4 g/dL — AB (ref 3.5–5.0)
ALT: 21 U/L (ref 0–55)
AST: 17 U/L (ref 5–34)
Alkaline Phosphatase: 95 U/L (ref 40–150)
Anion Gap: 9 mEq/L (ref 3–11)
BILIRUBIN TOTAL: 0.33 mg/dL (ref 0.20–1.20)
BUN: 11.8 mg/dL (ref 7.0–26.0)
CALCIUM: 9.4 mg/dL (ref 8.4–10.4)
CHLORIDE: 105 meq/L (ref 98–109)
CO2: 24 mEq/L (ref 22–29)
CREATININE: 0.8 mg/dL (ref 0.6–1.1)
EGFR: 87 mL/min/{1.73_m2} — ABNORMAL LOW (ref >90)
Glucose: 80 mg/dl (ref 70–140)
Potassium: 3.8 mEq/L (ref 3.5–5.1)
Sodium: 138 mEq/L (ref 136–145)
TOTAL PROTEIN: 6.7 g/dL (ref 6.4–8.3)

## 2016-09-13 MED ORDER — HEPARIN SOD (PORK) LOCK FLUSH 100 UNIT/ML IV SOLN
500.0000 [IU] | Freq: Once | INTRAVENOUS | Status: AC | PRN
  Administered 2016-09-13: 500 [IU]
  Filled 2016-09-13: qty 5

## 2016-09-13 MED ORDER — SODIUM CHLORIDE 0.9 % IV SOLN
4.0000 mg | Freq: Once | INTRAVENOUS | Status: AC
  Administered 2016-09-13: 4 mg via INTRAVENOUS
  Filled 2016-09-13: qty 0.4

## 2016-09-13 MED ORDER — FAMOTIDINE IN NACL 20-0.9 MG/50ML-% IV SOLN
INTRAVENOUS | Status: AC
  Filled 2016-09-13: qty 50

## 2016-09-13 MED ORDER — SODIUM CHLORIDE 0.9% FLUSH
10.0000 mL | INTRAVENOUS | Status: DC | PRN
  Administered 2016-09-13: 10 mL
  Filled 2016-09-13: qty 10

## 2016-09-13 MED ORDER — SODIUM CHLORIDE 0.9 % IV SOLN
80.0000 mg/m2 | Freq: Once | INTRAVENOUS | Status: AC
  Administered 2016-09-13: 174 mg via INTRAVENOUS
  Filled 2016-09-13: qty 29

## 2016-09-13 MED ORDER — ACETAMINOPHEN 325 MG PO TABS
650.0000 mg | ORAL_TABLET | Freq: Once | ORAL | Status: AC
  Administered 2016-09-13: 650 mg via ORAL

## 2016-09-13 MED ORDER — SODIUM CHLORIDE 0.9 % IV SOLN
Freq: Once | INTRAVENOUS | Status: AC
  Administered 2016-09-13: 14:00:00 via INTRAVENOUS

## 2016-09-13 MED ORDER — ALTEPLASE 2 MG IJ SOLR
2.0000 mg | Freq: Once | INTRAMUSCULAR | Status: AC | PRN
  Administered 2016-09-13: 2 mg
  Filled 2016-09-13: qty 2

## 2016-09-13 MED ORDER — FAMOTIDINE IN NACL 20-0.9 MG/50ML-% IV SOLN
20.0000 mg | Freq: Once | INTRAVENOUS | Status: AC
  Administered 2016-09-13: 20 mg via INTRAVENOUS

## 2016-09-13 MED ORDER — DIPHENHYDRAMINE HCL 50 MG/ML IJ SOLN
INTRAMUSCULAR | Status: AC
  Filled 2016-09-13: qty 1

## 2016-09-13 MED ORDER — DIPHENHYDRAMINE HCL 50 MG/ML IJ SOLN
50.0000 mg | Freq: Once | INTRAMUSCULAR | Status: AC
  Administered 2016-09-13: 50 mg via INTRAVENOUS

## 2016-09-13 MED ORDER — ACETAMINOPHEN 325 MG PO TABS
ORAL_TABLET | ORAL | Status: AC
Start: 2016-09-13 — End: 2016-09-13
  Filled 2016-09-13: qty 2

## 2016-09-13 MED ORDER — SODIUM CHLORIDE 0.9 % IJ SOLN
10.0000 mL | INTRAMUSCULAR | Status: DC | PRN
Start: 2016-09-13 — End: 2016-09-13
  Administered 2016-09-13: 10 mL via INTRAVENOUS
  Filled 2016-09-13: qty 10

## 2016-09-13 NOTE — Progress Notes (Signed)
Called in to flush room to assist with obtaining blood return via PAC.  Upon arrival, pt already heading out to lobby to wait for RN to do TPA dwell.  Upon assessment, pt reports she has this issue weekly and has needed TPA 3-4 times since starting treatment.  PAC placed 07/2016.  I explained to pt this was abnormal and we should not need to utilize TPA this frequently.  My recommendation was for pt to have dye study to evaluate PAC.  Pt in agreement.  TPA instilled at 11:37 and infusion RN notified.  Collaborative RN, Karna Christmas also notified of need for pt to have dye study set up.  Pt verbalized understanding of all information discussed with her and is without further questions or concerns at this time.
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
## 2016-09-13 NOTE — Patient Instructions (Signed)
[facility] Discharge Instructions for Patients Receiving Chemotherapy  Today you received the following chemotherapy agents Taxol   To help prevent nausea and vomiting after your treatment, we encourage you to take your nausea medication as directed.   If you develop nausea and vomiting that is not controlled by your nausea medication, call the clinic.   BELOW ARE SYMPTOMS THAT SHOULD BE REPORTED IMMEDIATELY:  *FEVER GREATER THAN 100.5 F  *CHILLS WITH OR WITHOUT FEVER  NAUSEA AND VOMITING THAT IS NOT CONTROLLED WITH YOUR NAUSEA MEDICATION  *UNUSUAL SHORTNESS OF BREATH  *UNUSUAL BRUISING OR BLEEDING  TENDERNESS IN MOUTH AND THROAT WITH OR WITHOUT PRESENCE OF ULCERS  *URINARY PROBLEMS  *BOWEL PROBLEMS  UNUSUAL RASH Items with * indicate a potential emergency and should be followed up as soon as possible.  Feel free to call the clinic you have any questions or concerns. The clinic phone number is [phone].  Please show the CHEMO ALERT CARD at check-in to the Emergency Department and triage nurse.   

## 2016-09-13 NOTE — Addendum Note (Signed)
Addended by: Carlene Coria L on: 09/13/2016 11:38 AM   Modules accepted: Orders

## 2016-09-18 ENCOUNTER — Ambulatory Visit: Payer: No Typology Code available for payment source

## 2016-09-18 ENCOUNTER — Other Ambulatory Visit: Payer: No Typology Code available for payment source

## 2016-09-20 ENCOUNTER — Ambulatory Visit (HOSPITAL_BASED_OUTPATIENT_CLINIC_OR_DEPARTMENT_OTHER): Payer: Medicaid Other

## 2016-09-20 ENCOUNTER — Other Ambulatory Visit (HOSPITAL_BASED_OUTPATIENT_CLINIC_OR_DEPARTMENT_OTHER): Payer: Medicaid Other

## 2016-09-20 ENCOUNTER — Ambulatory Visit: Payer: Medicaid Other

## 2016-09-20 VITALS — BP 147/88 | HR 83 | Temp 97.9°F | Resp 20

## 2016-09-20 DIAGNOSIS — C50411 Malignant neoplasm of upper-outer quadrant of right female breast: Secondary | ICD-10-CM

## 2016-09-20 DIAGNOSIS — Z5111 Encounter for antineoplastic chemotherapy: Secondary | ICD-10-CM

## 2016-09-20 DIAGNOSIS — C773 Secondary and unspecified malignant neoplasm of axilla and upper limb lymph nodes: Secondary | ICD-10-CM

## 2016-09-20 DIAGNOSIS — Z95828 Presence of other vascular implants and grafts: Secondary | ICD-10-CM

## 2016-09-20 DIAGNOSIS — C50919 Malignant neoplasm of unspecified site of unspecified female breast: Secondary | ICD-10-CM

## 2016-09-20 DIAGNOSIS — R5081 Fever presenting with conditions classified elsewhere: Principal | ICD-10-CM

## 2016-09-20 DIAGNOSIS — D709 Neutropenia, unspecified: Secondary | ICD-10-CM

## 2016-09-20 LAB — CBC WITH DIFFERENTIAL/PLATELET
BASO%: 0.2 % (ref 0.0–2.0)
BASOS ABS: 0 10*3/uL (ref 0.0–0.1)
EOS ABS: 0 10*3/uL (ref 0.0–0.5)
EOS%: 0.8 % (ref 0.0–7.0)
HCT: 33.6 % — ABNORMAL LOW (ref 34.8–46.6)
HEMOGLOBIN: 11.3 g/dL — AB (ref 11.6–15.9)
LYMPH%: 13.9 % — AB (ref 14.0–49.7)
MCH: 32.8 pg (ref 25.1–34.0)
MCHC: 33.6 g/dL (ref 31.5–36.0)
MCV: 97.7 fL (ref 79.5–101.0)
MONO#: 0.3 10*3/uL (ref 0.1–0.9)
MONO%: 5.8 % (ref 0.0–14.0)
NEUT#: 4.1 10*3/uL (ref 1.5–6.5)
NEUT%: 79.3 % — AB (ref 38.4–76.8)
Platelets: 272 10*3/uL (ref 145–400)
RBC: 3.44 10*6/uL — ABNORMAL LOW (ref 3.70–5.45)
RDW: 16.1 % — AB (ref 11.2–14.5)
WBC: 5.2 10*3/uL (ref 3.9–10.3)
lymph#: 0.7 10*3/uL — ABNORMAL LOW (ref 0.9–3.3)

## 2016-09-20 LAB — COMPREHENSIVE METABOLIC PANEL
ALBUMIN: 3.4 g/dL — AB (ref 3.5–5.0)
ALK PHOS: 107 U/L (ref 40–150)
ALT: 13 U/L (ref 0–55)
AST: 17 U/L (ref 5–34)
Anion Gap: 9 mEq/L (ref 3–11)
BUN: 8.9 mg/dL (ref 7.0–26.0)
CHLORIDE: 108 meq/L (ref 98–109)
CO2: 24 mEq/L (ref 22–29)
Calcium: 9.7 mg/dL (ref 8.4–10.4)
Creatinine: 0.8 mg/dL (ref 0.6–1.1)
EGFR: 89 mL/min/{1.73_m2} — AB (ref >90)
GLUCOSE: 88 mg/dL (ref 70–140)
POTASSIUM: 3.9 meq/L (ref 3.5–5.1)
SODIUM: 141 meq/L (ref 136–145)
Total Bilirubin: 0.22 mg/dL (ref 0.20–1.20)
Total Protein: 7 g/dL (ref 6.4–8.3)

## 2016-09-20 MED ORDER — SODIUM CHLORIDE 0.9% FLUSH
10.0000 mL | INTRAVENOUS | Status: DC | PRN
  Administered 2016-09-20: 10 mL
  Filled 2016-09-20: qty 10

## 2016-09-20 MED ORDER — FAMOTIDINE IN NACL 20-0.9 MG/50ML-% IV SOLN
INTRAVENOUS | Status: AC
  Filled 2016-09-20: qty 50

## 2016-09-20 MED ORDER — SODIUM CHLORIDE 0.9 % IV SOLN
80.0000 mg/m2 | Freq: Once | INTRAVENOUS | Status: AC
  Administered 2016-09-20: 174 mg via INTRAVENOUS
  Filled 2016-09-20: qty 29

## 2016-09-20 MED ORDER — DIPHENHYDRAMINE HCL 50 MG/ML IJ SOLN
50.0000 mg | Freq: Once | INTRAMUSCULAR | Status: AC
  Administered 2016-09-20: 50 mg via INTRAVENOUS

## 2016-09-20 MED ORDER — SODIUM CHLORIDE 0.9 % IV SOLN
Freq: Once | INTRAVENOUS | Status: AC
Start: 2016-09-20 — End: 2016-09-20
  Administered 2016-09-20: 10:00:00 via INTRAVENOUS

## 2016-09-20 MED ORDER — DIPHENHYDRAMINE HCL 50 MG/ML IJ SOLN
INTRAMUSCULAR | Status: AC
  Filled 2016-09-20: qty 1

## 2016-09-20 MED ORDER — DEXAMETHASONE SODIUM PHOSPHATE 10 MG/ML IJ SOLN
INTRAMUSCULAR | Status: AC
Start: 2016-09-20 — End: 2016-09-20
  Filled 2016-09-20: qty 1

## 2016-09-20 MED ORDER — DEXAMETHASONE SODIUM PHOSPHATE 10 MG/ML IJ SOLN
4.0000 mg | Freq: Once | INTRAMUSCULAR | Status: AC
  Administered 2016-09-20: 4 mg via INTRAVENOUS

## 2016-09-20 MED ORDER — FAMOTIDINE IN NACL 20-0.9 MG/50ML-% IV SOLN
20.0000 mg | Freq: Once | INTRAVENOUS | Status: AC
  Administered 2016-09-20: 20 mg via INTRAVENOUS

## 2016-09-20 MED ORDER — SODIUM CHLORIDE 0.9 % IJ SOLN
10.0000 mL | INTRAMUSCULAR | Status: DC | PRN
  Administered 2016-09-20: 10 mL via INTRAVENOUS
  Filled 2016-09-20: qty 10

## 2016-09-20 MED ORDER — HEPARIN SOD (PORK) LOCK FLUSH 100 UNIT/ML IV SOLN
500.0000 [IU] | Freq: Once | INTRAVENOUS | Status: AC | PRN
  Administered 2016-09-20: 500 [IU]
  Filled 2016-09-20: qty 5

## 2016-09-20 NOTE — Patient Instructions (Signed)
[facility] Discharge Instructions for Patients Receiving Chemotherapy  Today you received the following chemotherapy agents Taxol   To help prevent nausea and vomiting after your treatment, we encourage you to take your nausea medication as directed.   If you develop nausea and vomiting that is not controlled by your nausea medication, call the clinic.   BELOW ARE SYMPTOMS THAT SHOULD BE REPORTED IMMEDIATELY:  *FEVER GREATER THAN 100.5 F  *CHILLS WITH OR WITHOUT FEVER  NAUSEA AND VOMITING THAT IS NOT CONTROLLED WITH YOUR NAUSEA MEDICATION  *UNUSUAL SHORTNESS OF BREATH  *UNUSUAL BRUISING OR BLEEDING  TENDERNESS IN MOUTH AND THROAT WITH OR WITHOUT PRESENCE OF ULCERS  *URINARY PROBLEMS  *BOWEL PROBLEMS  UNUSUAL RASH Items with * indicate a potential emergency and should be followed up as soon as possible.  Feel free to call the clinic you have any questions or concerns. The clinic phone number is [phone].  Please show the CHEMO ALERT CARD at check-in to the Emergency Department and triage nurse.   

## 2016-09-25 ENCOUNTER — Other Ambulatory Visit: Payer: No Typology Code available for payment source

## 2016-09-25 ENCOUNTER — Ambulatory Visit: Payer: No Typology Code available for payment source

## 2016-09-26 ENCOUNTER — Other Ambulatory Visit: Payer: Self-pay | Admitting: Oncology

## 2016-09-27 ENCOUNTER — Other Ambulatory Visit (HOSPITAL_BASED_OUTPATIENT_CLINIC_OR_DEPARTMENT_OTHER): Payer: Medicaid Other

## 2016-09-27 ENCOUNTER — Ambulatory Visit (HOSPITAL_BASED_OUTPATIENT_CLINIC_OR_DEPARTMENT_OTHER): Payer: Medicaid Other

## 2016-09-27 ENCOUNTER — Ambulatory Visit (HOSPITAL_BASED_OUTPATIENT_CLINIC_OR_DEPARTMENT_OTHER): Payer: Medicaid Other | Admitting: Oncology

## 2016-09-27 VITALS — BP 123/82 | HR 86 | Temp 98.5°F | Resp 16

## 2016-09-27 VITALS — BP 134/88 | HR 86 | Temp 98.1°F | Resp 18 | Ht 64.0 in | Wt 225.4 lb

## 2016-09-27 DIAGNOSIS — C773 Secondary and unspecified malignant neoplasm of axilla and upper limb lymph nodes: Secondary | ICD-10-CM

## 2016-09-27 DIAGNOSIS — R5081 Fever presenting with conditions classified elsewhere: Secondary | ICD-10-CM

## 2016-09-27 DIAGNOSIS — C50411 Malignant neoplasm of upper-outer quadrant of right female breast: Secondary | ICD-10-CM | POA: Diagnosis not present

## 2016-09-27 DIAGNOSIS — Z23 Encounter for immunization: Secondary | ICD-10-CM

## 2016-09-27 DIAGNOSIS — D709 Neutropenia, unspecified: Secondary | ICD-10-CM

## 2016-09-27 DIAGNOSIS — Z170 Estrogen receptor positive status [ER+]: Secondary | ICD-10-CM | POA: Diagnosis not present

## 2016-09-27 DIAGNOSIS — C50919 Malignant neoplasm of unspecified site of unspecified female breast: Secondary | ICD-10-CM

## 2016-09-27 DIAGNOSIS — Z5111 Encounter for antineoplastic chemotherapy: Secondary | ICD-10-CM | POA: Diagnosis not present

## 2016-09-27 DIAGNOSIS — C50911 Malignant neoplasm of unspecified site of right female breast: Secondary | ICD-10-CM

## 2016-09-27 LAB — COMPREHENSIVE METABOLIC PANEL
ALT: 15 U/L (ref 0–55)
ANION GAP: 8 meq/L (ref 3–11)
AST: 16 U/L (ref 5–34)
Albumin: 3.5 g/dL (ref 3.5–5.0)
Alkaline Phosphatase: 117 U/L (ref 40–150)
BILIRUBIN TOTAL: 0.32 mg/dL (ref 0.20–1.20)
BUN: 12 mg/dL (ref 7.0–26.0)
CALCIUM: 9.6 mg/dL (ref 8.4–10.4)
CHLORIDE: 105 meq/L (ref 98–109)
CO2: 24 meq/L (ref 22–29)
CREATININE: 0.7 mg/dL (ref 0.6–1.1)
Glucose: 91 mg/dl (ref 70–140)
Potassium: 4.4 mEq/L (ref 3.5–5.1)
Sodium: 137 mEq/L (ref 136–145)
TOTAL PROTEIN: 7.2 g/dL (ref 6.4–8.3)

## 2016-09-27 LAB — CBC WITH DIFFERENTIAL/PLATELET
BASO%: 0.3 % (ref 0.0–2.0)
Basophils Absolute: 0 10*3/uL (ref 0.0–0.1)
EOS ABS: 0.1 10*3/uL (ref 0.0–0.5)
EOS%: 1.4 % (ref 0.0–7.0)
HEMATOCRIT: 34.8 % (ref 34.8–46.6)
HGB: 11.8 g/dL (ref 11.6–15.9)
LYMPH#: 0.6 10*3/uL — AB (ref 0.9–3.3)
LYMPH%: 17.1 % (ref 14.0–49.7)
MCH: 33.4 pg (ref 25.1–34.0)
MCHC: 33.9 g/dL (ref 31.5–36.0)
MCV: 98.6 fL (ref 79.5–101.0)
MONO#: 0.3 10*3/uL (ref 0.1–0.9)
MONO%: 7.6 % (ref 0.0–14.0)
NEUT#: 2.6 10*3/uL (ref 1.5–6.5)
NEUT%: 73.6 % (ref 38.4–76.8)
PLATELETS: 311 10*3/uL (ref 145–400)
RBC: 3.53 10*6/uL — AB (ref 3.70–5.45)
RDW: 16.1 % — ABNORMAL HIGH (ref 11.2–14.5)
WBC: 3.6 10*3/uL — ABNORMAL LOW (ref 3.9–10.3)

## 2016-09-27 MED ORDER — DIPHENHYDRAMINE HCL 50 MG/ML IJ SOLN
INTRAMUSCULAR | Status: AC
  Filled 2016-09-27: qty 1

## 2016-09-27 MED ORDER — SODIUM CHLORIDE 0.9% FLUSH
10.0000 mL | INTRAVENOUS | Status: DC | PRN
  Administered 2016-09-27: 10 mL
  Filled 2016-09-27: qty 10

## 2016-09-27 MED ORDER — FAMOTIDINE IN NACL 20-0.9 MG/50ML-% IV SOLN
20.0000 mg | Freq: Once | INTRAVENOUS | Status: AC
Start: 2016-09-27 — End: 2016-09-27
  Administered 2016-09-27: 20 mg via INTRAVENOUS

## 2016-09-27 MED ORDER — DIPHENHYDRAMINE HCL 50 MG/ML IJ SOLN
50.0000 mg | Freq: Once | INTRAMUSCULAR | Status: AC
  Administered 2016-09-27: 50 mg via INTRAVENOUS

## 2016-09-27 MED ORDER — SODIUM CHLORIDE 0.9 % IV SOLN
Freq: Once | INTRAVENOUS | Status: AC
  Administered 2016-09-27: 10:00:00 via INTRAVENOUS

## 2016-09-27 MED ORDER — HEPARIN SOD (PORK) LOCK FLUSH 100 UNIT/ML IV SOLN
500.0000 [IU] | Freq: Once | INTRAVENOUS | Status: AC | PRN
  Administered 2016-09-27: 500 [IU]
  Filled 2016-09-27: qty 5

## 2016-09-27 MED ORDER — INFLUENZA VAC SPLIT QUAD 0.5 ML IM SUSY
0.5000 mL | PREFILLED_SYRINGE | Freq: Once | INTRAMUSCULAR | Status: AC
  Administered 2016-09-27: 0.5 mL via INTRAMUSCULAR
  Filled 2016-09-27: qty 0.5

## 2016-09-27 MED ORDER — DEXAMETHASONE SODIUM PHOSPHATE 10 MG/ML IJ SOLN
4.0000 mg | Freq: Once | INTRAMUSCULAR | Status: AC
Start: 2016-09-27 — End: 2016-09-27
  Administered 2016-09-27: 4 mg via INTRAVENOUS

## 2016-09-27 MED ORDER — SODIUM CHLORIDE 0.9 % IV SOLN
80.0000 mg/m2 | Freq: Once | INTRAVENOUS | Status: AC
  Administered 2016-09-27: 174 mg via INTRAVENOUS
  Filled 2016-09-27: qty 29

## 2016-09-27 MED ORDER — FAMOTIDINE IN NACL 20-0.9 MG/50ML-% IV SOLN
INTRAVENOUS | Status: AC
  Filled 2016-09-27: qty 50

## 2016-09-27 MED ORDER — DEXAMETHASONE SODIUM PHOSPHATE 10 MG/ML IJ SOLN
INTRAMUSCULAR | Status: AC
  Filled 2016-09-27: qty 1

## 2016-09-27 NOTE — Patient Instructions (Addendum)
[place] Discharge Instructions for Patients Receiving Chemotherapy  Today you received the following chemotherapy agents Taxol  To help prevent nausea and vomiting after your treatment, we encourage you to take your nausea medication as directed.    If you develop nausea and vomiting that is not controlled by your nausea medication, call the clinic.   BELOW ARE SYMPTOMS THAT SHOULD BE REPORTED IMMEDIATELY:  *FEVER GREATER THAN 100.5 F  *CHILLS WITH OR WITHOUT FEVER  NAUSEA AND VOMITING THAT IS NOT CONTROLLED WITH YOUR NAUSEA MEDICATION  *UNUSUAL SHORTNESS OF BREATH  *UNUSUAL BRUISING OR BLEEDING  TENDERNESS IN MOUTH AND THROAT WITH OR WITHOUT PRESENCE OF ULCERS  *URINARY PROBLEMS  *BOWEL PROBLEMS  UNUSUAL RASH Items with * indicate a potential emergency and should be followed up as soon as possible.  Feel free to call the clinic you have any questions or concerns. The clinic phone number is [phone].  Please show the [place] at check-in to the Emergency Department and triage nurse.  Influenza Virus Vaccine injection What is this medicine? INFLUENZA VIRUS VACCINE (in floo EN zuh VAHY ruhs vak SEEN) helps to reduce the risk of getting influenza also known as the flu. The vaccine only helps protect you against some strains of the flu. This medicine may be used for other purposes; ask your health care provider or pharmacist if you have questions. What should I tell my health care provider before I take this medicine? They need to know if you have any of these conditions: -bleeding disorder like hemophilia -fever or infection -Guillain-Barre syndrome or other neurological problems -immune system problems -infection with the human immunodeficiency virus (HIV) or AIDS -low blood platelet counts -multiple sclerosis -an unusual or allergic reaction to influenza virus vaccine, latex, other medicines, foods, dyes, or preservatives. Different brands  of vaccines contain different allergens. Some may contain latex or eggs. Talk to your doctor about your allergies to make sure that you get the right vaccine. -pregnant or trying to get pregnant -breast-feeding How should I use this medicine? This vaccine is for injection into a muscle or under the skin. It is given by a health care professional. A copy of Vaccine Information Statements will be given before each vaccination. Read this sheet carefully each time. The sheet may change frequently. Talk to your healthcare provider to see which vaccines are right for you. Some vaccines should not be used in all age groups. Overdosage: If you think you have taken too much of this medicine contact a poison control center or emergency room at once. NOTE: This medicine is only for you. Do not share this medicine with others. What if I miss a dose? This does not apply. What may interact with this medicine? -chemotherapy or radiation therapy -medicines that lower your immune system like etanercept, anakinra, infliximab, and adalimumab -medicines that treat or prevent blood clots like warfarin -phenytoin -steroid medicines like prednisone or cortisone -theophylline -vaccines This list may not describe all possible interactions. Give your health care provider a list of all the medicines, herbs, non-prescription drugs, or dietary supplements you use. Also tell them if you smoke, drink alcohol, or use illegal drugs. Some items may interact with your medicine. What should I watch for while using this medicine? Report any side effects that do not go away within 3 days to your doctor or health care professional. Call your health care provider if any unusual symptoms occur within 6 weeks of receiving this vaccine. You may still  catch the flu, but the illness is not usually as bad. You cannot get the flu from the vaccine. The vaccine will not protect against colds or other illnesses that may cause fever. The  vaccine is needed every year. What side effects may I notice from receiving this medicine? Side effects that you should report to your doctor or health care professional as soon as possible: -allergic reactions like skin rash, itching or hives, swelling of the face, lips, or tongue Side effects that usually do not require medical attention (report to your doctor or health care professional if they continue or are bothersome): -fever -headache -muscle aches and pains -pain, tenderness, redness, or swelling at the injection site -tiredness This list may not describe all possible side effects. Call your doctor for medical advice about side effects. You may report side effects to FDA at 1-800-FDA-1088. Where should I keep my medicine? The vaccine will be given by a health care professional in a clinic, pharmacy, doctor's office, or other health care setting. You will not be given vaccine doses to store at home. NOTE: This sheet is a summary. It may not cover all possible information. If you have questions about this medicine, talk to your doctor, pharmacist, or health care provider.    2016, Elsevier/Gold Standard. (2015-06-11 10:07:28)
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
## 2016-10-02 ENCOUNTER — Ambulatory Visit: Payer: No Typology Code available for payment source

## 2016-10-02 ENCOUNTER — Telehealth: Payer: Self-pay | Admitting: Oncology

## 2016-10-02 ENCOUNTER — Other Ambulatory Visit: Payer: No Typology Code available for payment source

## 2016-10-02 NOTE — Telephone Encounter (Signed)
Patient sister called re schedule and two weekly treatments missing. Patient last seen 10/25 and lab/tx is weekly. Added appointments for 11/15 and 11/29. Sister aware and will get new schedule 11/1.

## 2016-10-03 ENCOUNTER — Other Ambulatory Visit: Payer: Self-pay

## 2016-10-03 MED ORDER — ELETRIPTAN HYDROBROMIDE 40 MG PO TABS: 40.0000 mg | ORAL_TABLET | ORAL | 0 refills | Status: DC

## 2016-10-04 ENCOUNTER — Other Ambulatory Visit: Payer: Self-pay | Admitting: *Deleted

## 2016-10-04 ENCOUNTER — Other Ambulatory Visit (HOSPITAL_BASED_OUTPATIENT_CLINIC_OR_DEPARTMENT_OTHER): Payer: Medicaid Other

## 2016-10-04 ENCOUNTER — Ambulatory Visit (HOSPITAL_BASED_OUTPATIENT_CLINIC_OR_DEPARTMENT_OTHER): Payer: Medicaid Other

## 2016-10-04 ENCOUNTER — Ambulatory Visit: Payer: Medicaid Other

## 2016-10-04 VITALS — BP 127/74 | HR 85 | Temp 98.6°F | Resp 17

## 2016-10-04 DIAGNOSIS — C773 Secondary and unspecified malignant neoplasm of axilla and upper limb lymph nodes: Secondary | ICD-10-CM

## 2016-10-04 DIAGNOSIS — C50411 Malignant neoplasm of upper-outer quadrant of right female breast: Secondary | ICD-10-CM | POA: Diagnosis not present

## 2016-10-04 DIAGNOSIS — C50919 Malignant neoplasm of unspecified site of unspecified female breast: Secondary | ICD-10-CM

## 2016-10-04 DIAGNOSIS — Z5189 Encounter for other specified aftercare: Secondary | ICD-10-CM | POA: Diagnosis not present

## 2016-10-04 DIAGNOSIS — Z5111 Encounter for antineoplastic chemotherapy: Secondary | ICD-10-CM | POA: Diagnosis not present

## 2016-10-04 DIAGNOSIS — R5081 Fever presenting with conditions classified elsewhere: Principal | ICD-10-CM

## 2016-10-04 DIAGNOSIS — D709 Neutropenia, unspecified: Secondary | ICD-10-CM

## 2016-10-04 DIAGNOSIS — Z95828 Presence of other vascular implants and grafts: Secondary | ICD-10-CM

## 2016-10-04 LAB — CBC WITH DIFFERENTIAL/PLATELET
BASO%: 0.6 % (ref 0.0–2.0)
Basophils Absolute: 0 10*3/uL (ref 0.0–0.1)
EOS ABS: 0 10*3/uL (ref 0.0–0.5)
EOS%: 1 % (ref 0.0–7.0)
HCT: 32.7 % — ABNORMAL LOW (ref 34.8–46.6)
HEMOGLOBIN: 11 g/dL — AB (ref 11.6–15.9)
LYMPH#: 0.7 10*3/uL — AB (ref 0.9–3.3)
LYMPH%: 21.9 % (ref 14.0–49.7)
MCH: 33.2 pg (ref 25.1–34.0)
MCHC: 33.6 g/dL (ref 31.5–36.0)
MCV: 98.8 fL (ref 79.5–101.0)
MONO#: 0.2 10*3/uL (ref 0.1–0.9)
MONO%: 7.1 % (ref 0.0–14.0)
NEUT#: 2.2 10*3/uL (ref 1.5–6.5)
NEUT%: 69.4 % (ref 38.4–76.8)
Platelets: 301 10*3/uL (ref 145–400)
RBC: 3.31 10*6/uL — AB (ref 3.70–5.45)
RDW: 15.9 % — AB (ref 11.2–14.5)
WBC: 3.1 10*3/uL — AB (ref 3.9–10.3)
nRBC: 0 % (ref 0–0)

## 2016-10-04 LAB — COMPREHENSIVE METABOLIC PANEL
ALBUMIN: 3.2 g/dL — AB (ref 3.5–5.0)
ALK PHOS: 107 U/L (ref 40–150)
ALT: 24 U/L (ref 0–55)
AST: 17 U/L (ref 5–34)
Anion Gap: 7 mEq/L (ref 3–11)
BUN: 16.6 mg/dL (ref 7.0–26.0)
CHLORIDE: 106 meq/L (ref 98–109)
CO2: 25 mEq/L (ref 22–29)
Calcium: 9.1 mg/dL (ref 8.4–10.4)
Creatinine: 0.7 mg/dL (ref 0.6–1.1)
GLUCOSE: 83 mg/dL (ref 70–140)
POTASSIUM: 4.1 meq/L (ref 3.5–5.1)
SODIUM: 138 meq/L (ref 136–145)
Total Bilirubin: 0.24 mg/dL (ref 0.20–1.20)
Total Protein: 6.5 g/dL (ref 6.4–8.3)

## 2016-10-04 MED ORDER — SODIUM CHLORIDE 0.9% FLUSH
10.0000 mL | INTRAVENOUS | Status: DC | PRN
  Administered 2016-10-04: 10 mL
  Filled 2016-10-04: qty 10

## 2016-10-04 MED ORDER — SODIUM CHLORIDE 0.9 % IV SOLN
Freq: Once | INTRAVENOUS | Status: AC
  Administered 2016-10-04: 11:00:00 via INTRAVENOUS

## 2016-10-04 MED ORDER — ALTEPLASE 2 MG IJ SOLR
2.0000 mg | Freq: Once | INTRAMUSCULAR | Status: AC | PRN
  Administered 2016-10-04: 2 mg
  Filled 2016-10-04: qty 2

## 2016-10-04 MED ORDER — FAMOTIDINE IN NACL 20-0.9 MG/50ML-% IV SOLN
INTRAVENOUS | Status: AC
  Filled 2016-10-04: qty 50

## 2016-10-04 MED ORDER — DIPHENHYDRAMINE HCL 50 MG/ML IJ SOLN
INTRAMUSCULAR | Status: AC
  Filled 2016-10-04: qty 1

## 2016-10-04 MED ORDER — HEPARIN SOD (PORK) LOCK FLUSH 100 UNIT/ML IV SOLN
500.0000 [IU] | Freq: Once | INTRAVENOUS | Status: AC | PRN
  Administered 2016-10-04: 500 [IU]
  Filled 2016-10-04: qty 5

## 2016-10-04 MED ORDER — DEXAMETHASONE SODIUM PHOSPHATE 10 MG/ML IJ SOLN
INTRAMUSCULAR | Status: AC
  Filled 2016-10-04: qty 1

## 2016-10-04 MED ORDER — SODIUM CHLORIDE 0.9 % IJ SOLN
10.0000 mL | INTRAMUSCULAR | Status: DC | PRN
  Administered 2016-10-04: 10 mL via INTRAVENOUS
  Filled 2016-10-04: qty 10

## 2016-10-04 MED ORDER — DEXAMETHASONE SODIUM PHOSPHATE 10 MG/ML IJ SOLN
4.0000 mg | Freq: Once | INTRAMUSCULAR | Status: AC
  Administered 2016-10-04: 4 mg via INTRAVENOUS

## 2016-10-04 MED ORDER — PACLITAXEL CHEMO INJECTION 300 MG/50ML
80.0000 mg/m2 | Freq: Once | INTRAVENOUS | Status: AC
  Administered 2016-10-04: 174 mg via INTRAVENOUS
  Filled 2016-10-04: qty 29

## 2016-10-04 MED ORDER — FAMOTIDINE IN NACL 20-0.9 MG/50ML-% IV SOLN
20.0000 mg | Freq: Once | INTRAVENOUS | Status: AC
  Administered 2016-10-04: 20 mg via INTRAVENOUS

## 2016-10-04 MED ORDER — DIPHENHYDRAMINE HCL 50 MG/ML IJ SOLN
50.0000 mg | Freq: Once | INTRAMUSCULAR | Status: AC
  Administered 2016-10-04: 50 mg via INTRAVENOUS

## 2016-10-04 NOTE — Patient Instructions (Signed)
[facility] Discharge Instructions for Patients Receiving Chemotherapy  Today you received the following chemotherapy agents:  TAxol  To help prevent nausea and vomiting after your treatment, we encourage you to take your nausea medication as prescribed.   If you develop nausea and vomiting that is not controlled by your nausea medication, call the clinic.   BELOW ARE SYMPTOMS THAT SHOULD BE REPORTED IMMEDIATELY:  *FEVER GREATER THAN 100.5 F  *CHILLS WITH OR WITHOUT FEVER  NAUSEA AND VOMITING THAT IS NOT CONTROLLED WITH YOUR NAUSEA MEDICATION  *UNUSUAL SHORTNESS OF BREATH  *UNUSUAL BRUISING OR BLEEDING  TENDERNESS IN MOUTH AND THROAT WITH OR WITHOUT PRESENCE OF ULCERS  *URINARY PROBLEMS  *BOWEL PROBLEMS  UNUSUAL RASH Items with * indicate a potential emergency and should be followed up as soon as possible.  Feel free to call the clinic you have any questions or concerns. The clinic phone number is [phone].  Please show the [facility] at check-in to the Emergency Department and triage nurse.

## 2016-10-04 NOTE — Progress Notes (Signed)
Port flushed several times and repositioned once. No blood return. Pt had labs drawn peripherally. Cathflow was administered through port @1013 . Chemo nurse called and made aware. Pt directed to infusion room.

## 2016-10-09 ENCOUNTER — Telehealth: Payer: Self-pay | Admitting: *Deleted

## 2016-10-09 ENCOUNTER — Encounter (HOSPITAL_COMMUNITY): Payer: Self-pay | Admitting: Interventional Radiology

## 2016-10-09 ENCOUNTER — Ambulatory Visit (HOSPITAL_COMMUNITY)
Admission: RE | Admit: 2016-10-09 | Discharge: 2016-10-09 | Disposition: A | Discharge status: Home / Self Care | Payer: Medicaid Other | Source: Ambulatory Visit | Attending: Interventional Radiology | Admitting: Interventional Radiology

## 2016-10-09 DIAGNOSIS — Y713 Surgical instruments, materials and cardiovascular devices (including sutures) associated with adverse incidents: Secondary | ICD-10-CM | POA: Diagnosis not present

## 2016-10-09 DIAGNOSIS — T82594A Other mechanical complication of infusion catheter, initial encounter: Secondary | ICD-10-CM | POA: Diagnosis present

## 2016-10-09 DIAGNOSIS — T82524A Displacement of infusion catheter, initial encounter: Secondary | ICD-10-CM | POA: Diagnosis not present

## 2016-10-09 DIAGNOSIS — Z79899 Other long term (current) drug therapy: Secondary | ICD-10-CM | POA: Diagnosis not present

## 2016-10-09 DIAGNOSIS — C50911 Malignant neoplasm of unspecified site of right female breast: Secondary | ICD-10-CM

## 2016-10-09 DIAGNOSIS — C773 Secondary and unspecified malignant neoplasm of axilla and upper limb lymph nodes: Secondary | ICD-10-CM

## 2016-10-09 DIAGNOSIS — C50919 Malignant neoplasm of unspecified site of unspecified female breast: Secondary | ICD-10-CM | POA: Insufficient documentation

## 2016-10-09 DIAGNOSIS — Z170 Estrogen receptor positive status [ER+]: Secondary | ICD-10-CM

## 2016-10-09 DIAGNOSIS — C50411 Malignant neoplasm of upper-outer quadrant of right female breast: Secondary | ICD-10-CM

## 2016-10-09 HISTORY — PX: IR GENERIC HISTORICAL: IMG1180011

## 2016-10-09 MED ORDER — HEPARIN SOD (PORK) LOCK FLUSH 100 UNIT/ML IV SOLN
INTRAVENOUS | Status: AC
  Filled 2016-10-09: qty 5

## 2016-10-09 MED ORDER — IOPAMIDOL (ISOVUE-300) INJECTION 61%
50.0000 mL | Freq: Once | INTRAVENOUS | Status: AC | PRN
  Administered 2016-10-09: 7 mL via INTRAVENOUS

## 2016-10-09 NOTE — Telephone Encounter (Signed)
Call from pt reporting swelling of R neck. Denies pain, dyspnea or difficulty swallowing. No swelling in arm. Pt had dye study of L chest port today, wonders if this is cause of swelling.  Informed pt this is not likely the cause of swelling contralaterally. Instructed pt to follow up on 11/8 as scheduled unless she develops pain. She voiced understanding.

## 2016-10-11 ENCOUNTER — Other Ambulatory Visit (HOSPITAL_BASED_OUTPATIENT_CLINIC_OR_DEPARTMENT_OTHER): Payer: Medicaid Other

## 2016-10-11 ENCOUNTER — Ambulatory Visit (HOSPITAL_BASED_OUTPATIENT_CLINIC_OR_DEPARTMENT_OTHER): Payer: Medicaid Other | Admitting: Oncology

## 2016-10-11 ENCOUNTER — Ambulatory Visit: Payer: Medicaid Other

## 2016-10-11 ENCOUNTER — Ambulatory Visit: Payer: No Typology Code available for payment source | Admitting: Oncology

## 2016-10-11 VITALS — BP 145/86 | HR 89 | Temp 98.6°F | Resp 18 | Ht 64.0 in | Wt 229.1 lb

## 2016-10-11 DIAGNOSIS — C773 Secondary and unspecified malignant neoplasm of axilla and upper limb lymph nodes: Secondary | ICD-10-CM

## 2016-10-11 DIAGNOSIS — Z170 Estrogen receptor positive status [ER+]: Secondary | ICD-10-CM

## 2016-10-11 DIAGNOSIS — G620 Drug-induced polyneuropathy: Secondary | ICD-10-CM | POA: Diagnosis not present

## 2016-10-11 DIAGNOSIS — C50411 Malignant neoplasm of upper-outer quadrant of right female breast: Secondary | ICD-10-CM

## 2016-10-11 DIAGNOSIS — Z95828 Presence of other vascular implants and grafts: Secondary | ICD-10-CM

## 2016-10-11 DIAGNOSIS — C50911 Malignant neoplasm of unspecified site of right female breast: Secondary | ICD-10-CM

## 2016-10-11 LAB — CBC WITH DIFFERENTIAL/PLATELET
BASO%: 0.4 % (ref 0.0–2.0)
BASOS ABS: 0 10*3/uL (ref 0.0–0.1)
EOS%: 0.8 % (ref 0.0–7.0)
Eosinophils Absolute: 0 10*3/uL (ref 0.0–0.5)
HEMATOCRIT: 33 % — AB (ref 34.8–46.6)
HEMOGLOBIN: 11.2 g/dL — AB (ref 11.6–15.9)
LYMPH#: 0.6 10*3/uL — AB (ref 0.9–3.3)
LYMPH%: 23.6 % (ref 14.0–49.7)
MCH: 33.7 pg (ref 25.1–34.0)
MCHC: 33.9 g/dL (ref 31.5–36.0)
MCV: 99.4 fL (ref 79.5–101.0)
MONO#: 0.2 10*3/uL (ref 0.1–0.9)
MONO%: 8.5 % (ref 0.0–14.0)
NEUT#: 1.7 10*3/uL (ref 1.5–6.5)
NEUT%: 66.7 % (ref 38.4–76.8)
Platelets: 281 10*3/uL (ref 145–400)
RBC: 3.32 10*6/uL — ABNORMAL LOW (ref 3.70–5.45)
RDW: 15.6 % — AB (ref 11.2–14.5)
WBC: 2.6 10*3/uL — AB (ref 3.9–10.3)

## 2016-10-11 LAB — COMPREHENSIVE METABOLIC PANEL
ALBUMIN: 3.3 g/dL — AB (ref 3.5–5.0)
ALK PHOS: 103 U/L (ref 40–150)
ALT: 37 U/L (ref 0–55)
AST: 30 U/L (ref 5–34)
Anion Gap: 9 mEq/L (ref 3–11)
BUN: 10.6 mg/dL (ref 7.0–26.0)
CHLORIDE: 108 meq/L (ref 98–109)
CO2: 24 mEq/L (ref 22–29)
CREATININE: 0.7 mg/dL (ref 0.6–1.1)
Calcium: 9.8 mg/dL (ref 8.4–10.4)
EGFR: >90 mL/min/{1.73_m2} (ref >90)
GLUCOSE: 80 mg/dL (ref 70–140)
POTASSIUM: 3.7 meq/L (ref 3.5–5.1)
SODIUM: 140 meq/L (ref 136–145)
Total Bilirubin: 0.29 mg/dL (ref 0.20–1.20)
Total Protein: 6.7 g/dL (ref 6.4–8.3)

## 2016-10-11 MED ORDER — SODIUM CHLORIDE 0.9 % IJ SOLN
10.0000 mL | INTRAMUSCULAR | Status: DC | PRN
Start: 2016-10-11 — End: 2017-03-20
  Administered 2016-10-11: 10 mL via INTRAVENOUS
  Filled 2016-10-11: qty 10

## 2016-10-11 NOTE — Progress Notes (Signed)
START ON PATHWAY REGIMEN - Breast  [id number]: Dose-Dense AC-T (Paclitaxel Weekly) - [Doxorubicin + Cyclophosphamide q14 Days x 4 Cycles, Followed by Paclitaxel 80 mg/m2 Weekly x 12 Weeks]  Dose-Dense AC q14 days:   A cycle is every 14 days:     Doxorubicin (Adriamycin(R)) 60 mg/m2 IV push on day 1 only. Dose Mod: None     Cyclophosphamide (Cytoxan(R)) 600 mg/m2 in 250 mL NS IV over 30 minutes on day 1 only. Dose Mod: None     Pegfilgrastim (Neulasta(R)) 6 mg flat dose subcutaneously once on day 2 only.  G-CSF recommended due to data showing a >20% risk of febrile neutropenia. Dose Mod: None  **Always confirm dose/schedule in your pharmacy ordering system**    Paclitaxel 80 mg/m2 Weekly:   Administer weekly:     Paclitaxel (Taxol(R)) 80 mg/m2 in 250 mL NS IV over 1 hour Dose Mod: None  **Always confirm dose/schedule in your pharmacy ordering system**    Patient Characteristics: Neoadjuvant Chemotherapy, HER2/neu Negative/Unknown/Equivocal AJCC Stage Grouping: IIIA Current Disease Status: No Distant Mets or Local Recurrence AJCC M Stage: 0 ER Status: Positive (+) AJCC N Stage: 2 AJCC T Stage: 2 HER2/neu: Negative (-) PR Status: Positive (+)  Intent of Therapy: Curative Intent, Discussed with Patient
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
## 2016-10-16 ENCOUNTER — Other Ambulatory Visit: Payer: Self-pay | Admitting: Oncology

## 2016-10-18 ENCOUNTER — Other Ambulatory Visit: Payer: No Typology Code available for payment source

## 2016-10-18 ENCOUNTER — Ambulatory Visit: Payer: No Typology Code available for payment source

## 2016-10-20 ENCOUNTER — Telehealth: Payer: Self-pay | Admitting: *Deleted

## 2016-10-20 NOTE — Telephone Encounter (Signed)
This RN was made aware today of dye study results obtained on 10/09/2016 showing malposition of tip of port a cath.  Pt is scheduled for Adria and Cytoxan on Monday 11/20-   Per MD review of above - pt needs to be seen by surgeon ASAP for probable revision of port.  This RN called [facility] and spoke with Raquel Sarna per above - stated no noted communication per above.  Requested to senf message to Dr Excell Seltzer via [facility] as well as to her for appropriate follow up.  This note will be sent to above - readings and films are available from 10/09/2016 read by Dr Kathlene Cote.  This RN will also contact pt per above to discuss need to postpone chemo until central line established.

## 2016-10-20 NOTE — Telephone Encounter (Signed)
This RN attempted to reach pt per concern with need for port revision and obtained VM on both number per demographic page.  Messages left on both per above with request to return call to speak to this RN and to reschedule chemo.

## 2016-10-22 NOTE — Telephone Encounter (Signed)
So it appears that this report which has been available for 10 days is being forwarded to me with 0 business days left to get it corrected?

## 2016-10-23 ENCOUNTER — Other Ambulatory Visit: Payer: Self-pay | Admitting: Oncology

## 2016-10-23 ENCOUNTER — Ambulatory Visit (HOSPITAL_BASED_OUTPATIENT_CLINIC_OR_DEPARTMENT_OTHER): Payer: Medicaid Other

## 2016-10-23 ENCOUNTER — Other Ambulatory Visit (HOSPITAL_BASED_OUTPATIENT_CLINIC_OR_DEPARTMENT_OTHER): Payer: Medicaid Other

## 2016-10-23 VITALS — BP 151/92 | HR 96 | Temp 98.2°F | Resp 18 | Wt 223.0 lb

## 2016-10-23 DIAGNOSIS — Z5111 Encounter for antineoplastic chemotherapy: Secondary | ICD-10-CM | POA: Diagnosis present

## 2016-10-23 DIAGNOSIS — D709 Neutropenia, unspecified: Secondary | ICD-10-CM

## 2016-10-23 DIAGNOSIS — C773 Secondary and unspecified malignant neoplasm of axilla and upper limb lymph nodes: Secondary | ICD-10-CM | POA: Diagnosis not present

## 2016-10-23 DIAGNOSIS — R5081 Fever presenting with conditions classified elsewhere: Principal | ICD-10-CM

## 2016-10-23 DIAGNOSIS — C50411 Malignant neoplasm of upper-outer quadrant of right female breast: Secondary | ICD-10-CM

## 2016-10-23 DIAGNOSIS — C50919 Malignant neoplasm of unspecified site of unspecified female breast: Secondary | ICD-10-CM

## 2016-10-23 LAB — CBC WITH DIFFERENTIAL/PLATELET
BASO%: 0.7 % (ref 0.0–2.0)
Basophils Absolute: 0 10*3/uL (ref 0.0–0.1)
EOS%: 1 % (ref 0.0–7.0)
Eosinophils Absolute: 0 10*3/uL (ref 0.0–0.5)
HCT: 36.3 % (ref 34.8–46.6)
HGB: 12.4 g/dL (ref 11.6–15.9)
LYMPH%: 12.8 % — AB (ref 14.0–49.7)
MCH: 33.6 pg (ref 25.1–34.0)
MCHC: 34.2 g/dL (ref 31.5–36.0)
MCV: 98.2 fL (ref 79.5–101.0)
MONO#: 0.4 10*3/uL (ref 0.1–0.9)
MONO%: 7.4 % (ref 0.0–14.0)
NEUT#: 3.8 10*3/uL (ref 1.5–6.5)
NEUT%: 78.1 % — AB (ref 38.4–76.8)
Platelets: 363 10*3/uL (ref 145–400)
RBC: 3.7 10*6/uL (ref 3.70–5.45)
RDW: 15.8 % — ABNORMAL HIGH (ref 11.2–14.5)
WBC: 4.9 10*3/uL (ref 3.9–10.3)
lymph#: 0.6 10*3/uL — ABNORMAL LOW (ref 0.9–3.3)

## 2016-10-23 LAB — COMPREHENSIVE METABOLIC PANEL
ALT: 18 U/L (ref 0–55)
AST: 18 U/L (ref 5–34)
Albumin: 3.2 g/dL — ABNORMAL LOW (ref 3.5–5.0)
Alkaline Phosphatase: 119 U/L (ref 40–150)
Anion Gap: 9 mEq/L (ref 3–11)
BUN: 9.8 mg/dL (ref 7.0–26.0)
CHLORIDE: 106 meq/L (ref 98–109)
CO2: 24 meq/L (ref 22–29)
CREATININE: 0.8 mg/dL (ref 0.6–1.1)
Calcium: 9.8 mg/dL (ref 8.4–10.4)
EGFR: 79 mL/min/{1.73_m2} — ABNORMAL LOW (ref >90)
Glucose: 128 mg/dl (ref 70–140)
Potassium: 4.1 mEq/L (ref 3.5–5.1)
Sodium: 138 mEq/L (ref 136–145)
Total Bilirubin: 0.35 mg/dL (ref 0.20–1.20)
Total Protein: 7 g/dL (ref 6.4–8.3)

## 2016-10-23 MED ORDER — DOXORUBICIN HCL CHEMO IV INJECTION 2 MG/ML
52.0000 mg/m2 | Freq: Once | INTRAVENOUS | Status: AC
  Administered 2016-10-23: 112 mg via INTRAVENOUS
  Filled 2016-10-23: qty 56

## 2016-10-23 MED ORDER — HEPARIN SOD (PORK) LOCK FLUSH 100 UNIT/ML IV SOLN
500.0000 [IU] | Freq: Once | INTRAVENOUS | Status: DC | PRN
  Filled 2016-10-23: qty 5

## 2016-10-23 MED ORDER — PEGFILGRASTIM 6 MG/0.6ML ~~LOC~~ PSKT
6.0000 mg | PREFILLED_SYRINGE | Freq: Once | SUBCUTANEOUS | Status: AC
  Administered 2016-10-23: 6 mg via SUBCUTANEOUS
  Filled 2016-10-23: qty 0.6

## 2016-10-23 MED ORDER — SODIUM CHLORIDE 0.9 % IV SOLN
Freq: Once | INTRAVENOUS | Status: AC
  Administered 2016-10-23: 10:00:00 via INTRAVENOUS
  Filled 2016-10-23: qty 5

## 2016-10-23 MED ORDER — SODIUM CHLORIDE 0.9% FLUSH
10.0000 mL | INTRAVENOUS | Status: DC | PRN
  Filled 2016-10-23: qty 10

## 2016-10-23 MED ORDER — CYCLOPHOSPHAMIDE CHEMO INJECTION 1 GM
520.0000 mg/m2 | Freq: Once | INTRAMUSCULAR | Status: AC
  Administered 2016-10-23: 1120 mg via INTRAVENOUS
  Filled 2016-10-23: qty 56

## 2016-10-23 MED ORDER — PALONOSETRON HCL INJECTION 0.25 MG/5ML
INTRAVENOUS | Status: AC
  Filled 2016-10-23: qty 5

## 2016-10-23 MED ORDER — SODIUM CHLORIDE 0.9 % IV SOLN
Freq: Once | INTRAVENOUS | Status: AC
  Administered 2016-10-23: 09:00:00 via INTRAVENOUS

## 2016-10-23 MED ORDER — PALONOSETRON HCL INJECTION 0.25 MG/5ML
0.2500 mg | Freq: Once | INTRAVENOUS | Status: AC
  Administered 2016-10-23: 0.25 mg via INTRAVENOUS

## 2016-10-23 NOTE — Telephone Encounter (Signed)
Hey Dr Excell Seltzer - and I am sorry if my note was not clear and yes - this report has been in the system for 10 days without intervention until now.  We are treating April [name] periphelly today - and goal per MD dictation is to have 2 more cycles prior to

## 2016-10-23 NOTE — Progress Notes (Signed)
Patient to see Dr. Jana Hakim due to note regarding malposition of port.  Administer chemotherapy using peripheral access per Dr. Jana Hakim.  Adriamycin administered through a free dripping normal saline line. Positive blood return noted before, every 3 mL during and after Adriamycin administration. Patient tolerated well.

## 2016-10-23 NOTE — Patient Instructions (Signed)
[facility] Discharge Instructions for Patients Receiving Chemotherapy  Today you received the following chemotherapy agents Adriamycin/Cytoxan.  To help prevent nausea and vomiting after your treatment, we encourage you to take your nausea medication as prescribed.    If you develop nausea and vomiting that is not controlled by your nausea medication, call the clinic.   BELOW ARE SYMPTOMS THAT SHOULD BE REPORTED IMMEDIATELY:  *FEVER GREATER THAN 100.5 F  *CHILLS WITH OR WITHOUT FEVER  NAUSEA AND VOMITING THAT IS NOT CONTROLLED WITH YOUR NAUSEA MEDICATION  *UNUSUAL SHORTNESS OF BREATH  *UNUSUAL BRUISING OR BLEEDING  TENDERNESS IN MOUTH AND THROAT WITH OR WITHOUT PRESENCE OF ULCERS  *URINARY PROBLEMS  *BOWEL PROBLEMS  UNUSUAL RASH Items with * indicate a potential emergency and should be followed up as soon as possible.  Feel free to call the clinic you have any questions or concerns. The clinic phone number is [phone].  Please show the CHEMO ALERT CARD at check-in to the Emergency Department and triage nurse.   

## 2016-10-24 ENCOUNTER — Other Ambulatory Visit: Payer: Self-pay | Admitting: Oncology

## 2016-10-25 ENCOUNTER — Other Ambulatory Visit: Payer: No Typology Code available for payment source

## 2016-10-25 ENCOUNTER — Ambulatory Visit (HOSPITAL_BASED_OUTPATIENT_CLINIC_OR_DEPARTMENT_OTHER): Payer: Medicaid Other | Admitting: Oncology

## 2016-10-25 ENCOUNTER — Ambulatory Visit (HOSPITAL_BASED_OUTPATIENT_CLINIC_OR_DEPARTMENT_OTHER): Payer: Medicaid Other

## 2016-10-25 ENCOUNTER — Ambulatory Visit: Payer: No Typology Code available for payment source

## 2016-10-25 VITALS — BP 91/47 | HR 87

## 2016-10-25 VITALS — BP 123/67 | HR 102 | Temp 98.5°F | Resp 18 | Ht 64.0 in | Wt 227.7 lb

## 2016-10-25 DIAGNOSIS — R110 Nausea: Secondary | ICD-10-CM

## 2016-10-25 DIAGNOSIS — Z170 Estrogen receptor positive status [ER+]: Secondary | ICD-10-CM

## 2016-10-25 DIAGNOSIS — C50411 Malignant neoplasm of upper-outer quadrant of right female breast: Secondary | ICD-10-CM

## 2016-10-25 DIAGNOSIS — R51 Headache: Secondary | ICD-10-CM

## 2016-10-25 DIAGNOSIS — Z95828 Presence of other vascular implants and grafts: Secondary | ICD-10-CM

## 2016-10-25 DIAGNOSIS — C773 Secondary and unspecified malignant neoplasm of axilla and upper limb lymph nodes: Secondary | ICD-10-CM

## 2016-10-25 DIAGNOSIS — C50911 Malignant neoplasm of unspecified site of right female breast: Secondary | ICD-10-CM

## 2016-10-25 MED ORDER — PROCHLORPERAZINE EDISYLATE 5 MG/ML IJ SOLN
10.0000 mg | Freq: Once | INTRAMUSCULAR | Status: AC
  Administered 2016-10-25: 10 mg via INTRAVENOUS

## 2016-10-25 MED ORDER — CIPROFLOXACIN HCL 500 MG PO TABS: 500.0000 mg | ORAL_TABLET | Freq: Two times a day (BID) | ORAL | 0 refills | Status: DC

## 2016-10-25 MED ORDER — PROCHLORPERAZINE EDISYLATE 5 MG/ML IJ SOLN
INTRAMUSCULAR | Status: AC
  Filled 2016-10-25: qty 2

## 2016-10-25 MED ORDER — SODIUM CHLORIDE 0.9 % IV SOLN
Freq: Once | INTRAVENOUS | Status: AC
  Administered 2016-10-25: 09:00:00 via INTRAVENOUS
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
## 2016-10-25 NOTE — Patient Instructions (Signed)
Dehydration, Adult Dehydration is a condition in which there is not enough fluid or water in the body. This happens when you lose more fluids than you take in. Important organs, such as the kidneys, brain, and heart, cannot function without a proper amount of fluids. Any loss of fluids from the body can lead to dehydration. Dehydration can range from mild to severe. This condition should be treated right away to prevent it from becoming severe. What are the causes? This condition may be caused by:  Vomiting.  Diarrhea.  Excessive sweating, such as from heat exposure or exercise.  Not drinking enough fluid, especially:  When ill.  While doing activity that requires a lot of energy.  Excessive urination.  Fever.  Infection.  Certain medicines, such as medicines that cause the body to lose excess fluid (diuretics).  Inability to access safe drinking water.  Reduced physical ability to get adequate water and food. What increases the risk? This condition is more likely to develop in people:  Who have a poorly controlled long-term (chronic) illness, such as diabetes, heart disease, or kidney disease.  Who are age 65 or older.  Who are disabled.  Who live in a place with high altitude.  Who play endurance sports. What are the signs or symptoms? Symptoms of mild dehydration may include:   Thirst.  Dry lips.  Slightly dry mouth.  Dry, warm skin.  Dizziness. Symptoms of moderate dehydration may include:   Very dry mouth.  Muscle cramps.  Dark urine. Urine may be the color of tea.  Decreased urine production.  Decreased tear production.  Heartbeat that is irregular or faster than normal (palpitations).  Headache.  Light-headedness, especially when you stand up from a sitting position.  Fainting (syncope). Symptoms of severe dehydration may include:   Changes in skin, such as:  Cold and clammy skin.  Blotchy (mottled) or pale skin.  Skin that does  not quickly return to normal after being lightly pinched and released (poor skin turgor).  Changes in body fluids, such as:  Extreme thirst.  No tear production.  Inability to sweat when body temperature is high, such as in hot weather.  Very little urine production.  Changes in vital signs, such as:  Weak pulse.  Pulse that is more than 100 beats a minute when sitting still.  Rapid breathing.  Low blood pressure.  Other changes, such as:  Sunken eyes.  Cold hands and feet.  Confusion.  Lack of energy (lethargy).  Difficulty waking up from sleep.  Short-term weight loss.  Unconsciousness. How is this diagnosed? This condition is diagnosed based on your symptoms and a physical exam. Blood and urine tests may be done to help confirm the diagnosis. How is this treated? Treatment for this condition depends on the severity. Mild or moderate dehydration can often be treated at home. Treatment should be started right away. Do not wait until dehydration becomes severe. Severe dehydration is an emergency and it needs to be treated in a hospital. Treatment for mild dehydration may include:   Drinking more fluids.  Replacing salts and minerals in your blood (electrolytes) that you may have lost. Treatment for moderate dehydration may include:   Drinking an oral rehydration solution (ORS). This is a drink that helps you replace fluids and electrolytes (rehydrate). It can be found at pharmacies and retail stores. Treatment for severe dehydration may include:   Receiving fluids through an IV tube.  Receiving an electrolyte solution through a feeding tube that is   passed through your nose and into your stomach (nasogastric tube, or NG tube).  Correcting any abnormalities in electrolytes.  Treating the underlying cause of dehydration. Follow these instructions at home:  If directed by your health care provider, drink an ORS:  Make an ORS by following instructions on the  package.  Start by drinking small amounts, about  cup (120 mL) every 5-10 minutes.  Slowly increase how much you drink until you have taken the amount recommended by your health care provider.  Drink enough clear fluid to keep your urine clear or pale yellow. If you were told to drink an ORS, finish the ORS first, then start slowly drinking other clear fluids. Drink fluids such as:  Water. Do not drink only water. Doing that can lead to having too little salt (sodium) in the body (hyponatremia).  Ice chips.  Fruit juice that you have added water to (diluted fruit juice).  Low-calorie sports drinks.  Avoid:  Alcohol.  Drinks that contain a lot of sugar. These include high-calorie sports drinks, fruit juice that is not diluted, and soda.  Caffeine.  Foods that are greasy or contain a lot of fat or sugar.  Take over-the-counter and prescription medicines only as told by your health care provider.  Do not take sodium tablets. This can lead to having too much sodium in the body (hypernatremia).  Eat foods that contain a healthy balance of electrolytes, such as bananas, oranges, potatoes, tomatoes, and spinach.  Keep all follow-up visits as told by your health care provider. This is important. Contact a health care provider if:  You have abdominal pain that:  Gets worse.  Stays in one area (localizes).  You have a rash.  You have a stiff neck.  You are more irritable than usual.  You are sleepier or more difficult to wake up than usual.  You feel weak or dizzy.  You feel very thirsty.  You have urinated only a small amount of very dark urine over 6-8 hours. Get help right away if:  You have symptoms of severe dehydration.  You cannot drink fluids without vomiting.  Your symptoms get worse with treatment.  You have a fever.  You have a severe headache.  You have vomiting or diarrhea that:  Gets worse.  Does not go away.  You have blood or green matter  (bile) in your vomit.  You have blood in your stool. This may cause stool to look black and tarry.  You have not urinated in 6-8 hours.  You faint.  Your heart rate while sitting still is over 100 beats a minute.  You have trouble breathing. This information is not intended to replace advice given to you by your health care provider. Make sure you discuss any questions you have with your health care provider. Document Released: 11/20/2005 Document Revised: 06/16/2016 Document Reviewed: 01/14/2016 Elsevier Interactive Patient Education  2017 [employer]  

## 2016-10-30 ENCOUNTER — Other Ambulatory Visit (HOSPITAL_BASED_OUTPATIENT_CLINIC_OR_DEPARTMENT_OTHER): Payer: Medicaid Other

## 2016-10-30 ENCOUNTER — Ambulatory Visit (HOSPITAL_BASED_OUTPATIENT_CLINIC_OR_DEPARTMENT_OTHER): Payer: Medicaid Other

## 2016-10-30 ENCOUNTER — Ambulatory Visit: Payer: No Typology Code available for payment source

## 2016-10-30 ENCOUNTER — Ambulatory Visit (HOSPITAL_BASED_OUTPATIENT_CLINIC_OR_DEPARTMENT_OTHER): Payer: Medicaid Other | Admitting: Oncology

## 2016-10-30 VITALS — BP 118/90 | HR 108 | Temp 98.9°F | Resp 18 | Ht 64.0 in | Wt 223.9 lb

## 2016-10-30 VITALS — BP 124/86 | HR 105 | Temp 99.2°F | Resp 16

## 2016-10-30 DIAGNOSIS — C50411 Malignant neoplasm of upper-outer quadrant of right female breast: Secondary | ICD-10-CM | POA: Diagnosis not present

## 2016-10-30 DIAGNOSIS — Z170 Estrogen receptor positive status [ER+]: Secondary | ICD-10-CM | POA: Diagnosis not present

## 2016-10-30 DIAGNOSIS — C50911 Malignant neoplasm of unspecified site of right female breast: Secondary | ICD-10-CM

## 2016-10-30 DIAGNOSIS — R110 Nausea: Secondary | ICD-10-CM

## 2016-10-30 DIAGNOSIS — C773 Secondary and unspecified malignant neoplasm of axilla and upper limb lymph nodes: Secondary | ICD-10-CM

## 2016-10-30 DIAGNOSIS — Z95828 Presence of other vascular implants and grafts: Secondary | ICD-10-CM

## 2016-10-30 LAB — COMPREHENSIVE METABOLIC PANEL
ALT: 12 U/L (ref 0–55)
AST: 13 U/L (ref 5–34)
Albumin: 3.4 g/dL — ABNORMAL LOW (ref 3.5–5.0)
Alkaline Phosphatase: 136 U/L (ref 40–150)
Anion Gap: 11 mEq/L (ref 3–11)
BILIRUBIN TOTAL: 0.56 mg/dL (ref 0.20–1.20)
BUN: 8.8 mg/dL (ref 7.0–26.0)
CO2: 23 meq/L (ref 22–29)
Calcium: 10.2 mg/dL (ref 8.4–10.4)
Chloride: 101 mEq/L (ref 98–109)
Creatinine: 0.7 mg/dL (ref 0.6–1.1)
GLUCOSE: 112 mg/dL (ref 70–140)
Potassium: 3.9 mEq/L (ref 3.5–5.1)
SODIUM: 134 meq/L — AB (ref 136–145)
TOTAL PROTEIN: 7.4 g/dL (ref 6.4–8.3)

## 2016-10-30 LAB — TECHNOLOGIST REVIEW

## 2016-10-30 LAB — CBC WITH DIFFERENTIAL/PLATELET
HCT: 37.3 % (ref 34.8–46.6)
HEMOGLOBIN: 12.7 g/dL (ref 11.6–15.9)
MCH: 32.6 pg (ref 25.1–34.0)
MCHC: 34 g/dL (ref 31.5–36.0)
MCV: 95.9 fL (ref 79.5–101.0)
Platelets: 127 10*3/uL — ABNORMAL LOW (ref 145–400)
RBC: 3.89 10*6/uL (ref 3.70–5.45)
RDW: 13.9 % (ref 11.2–14.5)

## 2016-10-30 MED ORDER — PROCHLORPERAZINE EDISYLATE 5 MG/ML IJ SOLN
10.0000 mg | Freq: Once | INTRAMUSCULAR | Status: AC
  Administered 2016-10-30: 10 mg via INTRAVENOUS

## 2016-10-30 MED ORDER — ALTEPLASE 2 MG IJ SOLR
2.0000 mg | Freq: Once | INTRAMUSCULAR | Status: DC | PRN
  Filled 2016-10-30: qty 2

## 2016-10-30 MED ORDER — FLUCONAZOLE 100 MG PO TABS: 100.0000 mg | ORAL_TABLET | Freq: Every day | ORAL | 1 refills | Status: DC

## 2016-10-30 MED ORDER — SODIUM CHLORIDE 0.9 % IV SOLN
INTRAVENOUS | Status: DC
  Administered 2016-10-30: 09:00:00 via INTRAVENOUS

## 2016-10-30 MED ORDER — PROCHLORPERAZINE EDISYLATE 5 MG/ML IJ SOLN
INTRAMUSCULAR | Status: AC
Start: 2016-10-30 — End: 2016-10-30
  Filled 2016-10-30: qty 2
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
## 2016-10-30 NOTE — Patient Instructions (Signed)
Nausea, Adult  Nausea is the feeling of an upset stomach or having to vomit. Nausea on its own is not usually a serious concern, but it may be an early sign of a more serious medical problem. As nausea gets worse, it can lead to vomiting. If vomiting develops, or if you are not able to drink enough fluids, you are at risk of becoming dehydrated. Dehydration can make you tired and thirsty, cause you to have a dry mouth, and decrease how often you urinate. Older adults and people with other diseases or a weak immune system are at higher risk for dehydration. The main goals of treating your nausea are:  · To limit repeated nausea episodes.  · To prevent vomiting and dehydration.    Follow these instructions at home:  Follow instructions from your health care provider about how to care for yourself at home.  Eating and drinking   Follow these recommendations as told by your health care provider:  · Take an oral rehydration solution (ORS). This is a drink that is sold at pharmacies and retail stores.  · Drink clear fluids in small amounts as you are able. Clear fluids include water, ice chips, diluted fruit juice, and low-calorie sports drinks.  · Eat bland, easy-to-digest foods in small amounts as you are able. These foods include bananas, applesauce, rice, lean meats, toast, and crackers.  · Avoid drinking fluids that contain a lot of sugar or caffeine, such as energy drinks, sports drinks, and soda.  · Avoid alcohol.  · Avoid spicy or fatty foods.    General instructions   · Drink enough fluid to keep your urine clear or pale yellow.  · Wash your hands often. If soap and water are not available, use hand sanitizer.  · Make sure that all people in your household wash their hands well and often.  · Rest at home while you recover.  · Take over-the-counter and prescription medicines only as told by your health care provider.  · Breathe slowly and deeply when you feel nauseous.  · Watch your condition for any  changes.  · Keep all follow-up visits as told by your health care provider. This is important.  Contact a health care provider if:  · You have a headache.  · You have new symptoms.  · Your nausea gets worse.  · You have a fever.  · You feel light-headed or dizzy.  · You vomit.  · You cannot keep fluids down.  Get help right away if:  · You have pain in your chest, neck, arm, or jaw.  · You feel extremely weak or you faint.  · You have vomit that is bright red or looks like coffee grounds.  · You have bloody or black stools or stools that look like tar.  · You have a severe headache, a stiff neck, or both.  · You have severe pain, cramping, or bloating in your abdomen.  · You have a rash.  · You have difficulty breathing or are breathing very quickly.  · Your heart is beating very quickly.  · Your skin feels cold and clammy.  · You feel confused.  · You have pain when you urinate.  · You have signs of dehydration, such as:  ? Dark urine, very little, or no urine.  ? Cracked lips.  ? Dry mouth.  ? Sunken eyes.  ? Sleepiness.  ? Weakness.  These symptoms may represent a serious problem that is an emergency. Do   not wait to see if the symptoms will go away. Get medical help right away. Call your local emergency services (911 in the [place] Do not drive yourself to the hospital.  This information is not intended to replace advice given to you by your health care provider. Make sure you discuss any questions you have with your health care provider.  Document Released: 12/28/2004 Document Revised: 04/24/2016 Document Reviewed: 07/27/2015  Elsevier Interactive Patient Education © 2017 [employer]

## 2016-10-31 ENCOUNTER — Ambulatory Visit: Payer: No Typology Code available for payment source | Admitting: Nurse Practitioner

## 2016-10-31 ENCOUNTER — Telehealth: Payer: Self-pay | Admitting: *Deleted

## 2016-10-31 NOTE — Telephone Encounter (Signed)
TCT patient this am as she did not come to 9am appt.  Spoke with patient.  She states she left a message this am around 8:15 that she would not be here as she was too tired. Informed her that I did not get the message.  She states that she will come in the rest of the week but that today she did not have the energy.  She takes [employer] to get here and the ride would have come too early for her this morning.  Encouraged her to drink as much fluids as possible to keep hydrated. She stated she would do her best. She understands to call if she feels worse today.  Urgent scheduling message sent to cancel today's appt and to move move Thursday's appt to a little later in the am at patient's request.

## 2016-11-01 ENCOUNTER — Other Ambulatory Visit: Payer: No Typology Code available for payment source

## 2016-11-01 ENCOUNTER — Other Ambulatory Visit (HOSPITAL_BASED_OUTPATIENT_CLINIC_OR_DEPARTMENT_OTHER): Payer: Medicaid Other

## 2016-11-01 ENCOUNTER — Telehealth: Payer: Self-pay | Admitting: *Deleted

## 2016-11-01 ENCOUNTER — Other Ambulatory Visit: Payer: Self-pay | Admitting: Oncology

## 2016-11-01 ENCOUNTER — Ambulatory Visit: Payer: No Typology Code available for payment source

## 2016-11-01 ENCOUNTER — Ambulatory Visit (HOSPITAL_BASED_OUTPATIENT_CLINIC_OR_DEPARTMENT_OTHER): Payer: Medicaid Other | Admitting: Nurse Practitioner

## 2016-11-01 VITALS — BP 128/82 | HR 97 | Temp 98.9°F | Resp 18 | Ht 64.0 in | Wt 223.3 lb

## 2016-11-01 DIAGNOSIS — C50411 Malignant neoplasm of upper-outer quadrant of right female breast: Secondary | ICD-10-CM

## 2016-11-01 DIAGNOSIS — Z95828 Presence of other vascular implants and grafts: Secondary | ICD-10-CM

## 2016-11-01 DIAGNOSIS — R110 Nausea: Secondary | ICD-10-CM

## 2016-11-01 LAB — COMPREHENSIVE METABOLIC PANEL
ALBUMIN: 3.4 g/dL — AB (ref 3.5–5.0)
ALK PHOS: 118 U/L (ref 40–150)
ALT: 12 U/L (ref 0–55)
ANION GAP: 11 meq/L (ref 3–11)
AST: 10 U/L (ref 5–34)
BUN: 9.9 mg/dL (ref 7.0–26.0)
CALCIUM: 10.1 mg/dL (ref 8.4–10.4)
CHLORIDE: 106 meq/L (ref 98–109)
CO2: 20 mEq/L — ABNORMAL LOW (ref 22–29)
CREATININE: 0.7 mg/dL (ref 0.6–1.1)
EGFR: >90 mL/min/{1.73_m2} (ref >90)
Glucose: 104 mg/dl (ref 70–140)
POTASSIUM: 3.4 meq/L — AB (ref 3.5–5.1)
Sodium: 137 mEq/L (ref 136–145)
Total Bilirubin: 0.23 mg/dL (ref 0.20–1.20)
Total Protein: 7.7 g/dL (ref 6.4–8.3)

## 2016-11-01 LAB — CBC WITH DIFFERENTIAL/PLATELET
BASO%: 3.6 % — ABNORMAL HIGH (ref 0.0–2.0)
BASOS ABS: 0 10*3/uL (ref 0.0–0.1)
EOS ABS: 0 10*3/uL (ref 0.0–0.5)
EOS%: 1.8 % (ref 0.0–7.0)
HEMATOCRIT: 34.4 % — AB (ref 34.8–46.6)
HGB: 12 g/dL (ref 11.6–15.9)
LYMPH%: 60.7 % — AB (ref 14.0–49.7)
MCH: 32.7 pg (ref 25.1–34.0)
MCHC: 34.9 g/dL (ref 31.5–36.0)
MCV: 93.7 fL (ref 79.5–101.0)
MONO#: 0.2 10*3/uL (ref 0.1–0.9)
MONO%: 30.4 % — ABNORMAL HIGH (ref 0.0–14.0)
NEUT#: 0 10*3/uL — CL (ref 1.5–6.5)
NEUT%: 3.5 % — AB (ref 38.4–76.8)
PLATELETS: 148 10*3/uL (ref 145–400)
RBC: 3.67 10*6/uL — ABNORMAL LOW (ref 3.70–5.45)
RDW: 13.5 % (ref 11.2–14.5)
WBC: 0.6 10*3/uL — CL (ref 3.9–10.3)
lymph#: 0.3 10*3/uL — ABNORMAL LOW (ref 0.9–3.3)
nRBC: 0 % (ref 0–0)

## 2016-11-01 MED ORDER — SODIUM CHLORIDE 0.9 % IV SOLN
250.0000 mL | INTRAVENOUS | Status: DC
  Administered 2016-11-01: 10:00:00 via INTRAVENOUS

## 2016-11-01 NOTE — Telephone Encounter (Signed)
Per lab values pt may start next cycle of Ibrance.  Noted congestion is improving with no fevers.

## 2016-11-01 NOTE — Progress Notes (Signed)
TCT Dr. Virgie Dad nurse, Val, RN to advise that pt's [place] is 0.0 and that her K+ is 3.4. 1200 Counseled patient on dietary K+ supplements and neutropenia precautions. Given face masks to wear when out of the house.  Pt voiced understanding.  IV saline locked as she will be getting fluids again tomorrow. Good blood return noted.

## 2016-11-01 NOTE — Patient Instructions (Addendum)
Dehydration, Adult Dehydration is a condition in which there is not enough fluid or water in the body. This happens when you lose more fluids than you take in. Important organs, such as the kidneys, brain, and heart, cannot function without a proper amount of fluids. Any loss of fluids from the body can lead to dehydration. Dehydration can range from mild to severe. This condition should be treated right away to prevent it from becoming severe. What are the causes? This condition may be caused by:  Vomiting.  Diarrhea.  Excessive sweating, such as from heat exposure or exercise.  Not drinking enough fluid, especially:  When ill.  While doing activity that requires a lot of energy.  Excessive urination.  Fever.  Infection.  Certain medicines, such as medicines that cause the body to lose excess fluid (diuretics).  Inability to access safe drinking water.  Reduced physical ability to get adequate water and food. What increases the risk? This condition is more likely to develop in people:  Who have a poorly controlled long-term (chronic) illness, such as diabetes, heart disease, or kidney disease.  Who are age 65 or older.  Who are disabled.  Who live in a place with high altitude.  Who play endurance sports. What are the signs or symptoms? Symptoms of mild dehydration may include:  Thirst.  Dry lips.  Slightly dry mouth.  Dry, warm skin.  Dizziness. Symptoms of moderate dehydration may include:  Very dry mouth.  Muscle cramps.  Dark urine. Urine may be the color of tea.  Decreased urine production.  Decreased tear production.  Heartbeat that is irregular or faster than normal (palpitations).  Headache.  Light-headedness, especially when you stand up from a sitting position.  Fainting (syncope). Symptoms of severe dehydration may include:  Changes in skin, such as:  Cold and clammy skin.  Blotchy (mottled) or pale skin.  Skin that does not  quickly return to normal after being lightly pinched and released (poor skin turgor).  Changes in body fluids, such as:  Extreme thirst.  No tear production.  Inability to sweat when body temperature is high, such as in hot weather.  Very little urine production.  Changes in vital signs, such as:  Weak pulse.  Pulse that is more than 100 beats a minute when sitting still.  Rapid breathing.  Low blood pressure.  Other changes, such as:  Sunken eyes.  Cold hands and feet.  Confusion.  Lack of energy (lethargy).  Difficulty waking up from sleep.  Short-term weight loss.  Unconsciousness. How is this diagnosed? This condition is diagnosed based on your symptoms and a physical exam. Blood and urine tests may be done to help confirm the diagnosis. How is this treated? Treatment for this condition depends on the severity. Mild or moderate dehydration can often be treated at home. Treatment should be started right away. Do not wait until dehydration becomes severe. Severe dehydration is an emergency and it needs to be treated in a hospital. Treatment for mild dehydration may include:  Drinking more fluids.  Replacing salts and minerals in your blood (electrolytes) that you may have lost. Treatment for moderate dehydration may include:  Drinking an oral rehydration solution (ORS). This is a drink that helps you replace fluids and electrolytes (rehydrate). It can be found at pharmacies and retail stores. Treatment for severe dehydration may include:  Receiving fluids through an IV tube.  Receiving an electrolyte solution through a feeding tube that is passed through your nose and into   your stomach (nasogastric tube, or NG tube).  Correcting any abnormalities in electrolytes.  Treating the underlying cause of dehydration. Follow these instructions at home:  If directed by your health care provider, drink an ORS:  Make an ORS by following instructions on the  package.  Start by drinking small amounts, about  cup (120 mL) every 5-10 minutes.  Slowly increase how much you drink until you have taken the amount recommended by your health care provider.  Drink enough clear fluid to keep your urine clear or pale yellow. If you were told to drink an ORS, finish the ORS first, then start slowly drinking other clear fluids. Drink fluids such as:  Water. Do not drink only water. Doing that can lead to having too little salt (sodium) in the body (hyponatremia).  Ice chips.  Fruit juice that you have added water to (diluted fruit juice).  Low-calorie sports drinks.  Avoid:  Alcohol.  Drinks that contain a lot of sugar. These include high-calorie sports drinks, fruit juice that is not diluted, and soda.  Caffeine.  Foods that are greasy or contain a lot of fat or sugar.  Take over-the-counter and prescription medicines only as told by your health care provider.  Do not take sodium tablets. This can lead to having too much sodium in the body (hypernatremia).  Eat foods that contain a healthy balance of electrolytes, such as bananas, oranges, potatoes, tomatoes, and spinach.  Keep all follow-up visits as told by your health care provider. This is important. Contact a health care provider if:  You have abdominal pain that:  Gets worse.  Stays in one area (localizes).  You have a rash.  You have a stiff neck.  You are more irritable than usual.  You are sleepier or more difficult to wake up than usual.  You feel weak or dizzy.  You feel very thirsty.  You have urinated only a small amount of very dark urine over 6-8 hours. Get help right away if:  You have symptoms of severe dehydration.  You cannot drink fluids without vomiting.  Your symptoms get worse with treatment.  You have a fever.  You have a severe headache.  You have vomiting or diarrhea that:  Gets worse.  Does not go away.  You have blood or green matter  (bile) in your vomit.  You have blood in your stool. This may cause stool to look black and tarry.  You have not urinated in 6-8 hours.  You faint.  Your heart rate while sitting still is over 100 beats a minute.  You have trouble breathing. This information is not intended to replace advice given to you by your health care provider. Make sure you discuss any questions you have with your health care provider. Document Released: 11/20/2005 Document Revised: 06/16/2016 Document Reviewed: 01/14/2016 Elsevier Interactive Patient Education  2017 [employer]    Neutropenia Introduction Neutropenia is a condition that occurs when you have a lower-than-normal level of a type of white blood cell (neutrophil) in your body. Neutrophils are made in the spongy center of large bones (bone marrow) and they fight infections. Neutrophils are your body's main defense against bacterial and fungal infections. The fewer neutrophils you have and the longer your body remains without them, the greater your risk of getting a severe infection. What are the causes? This condition can occur if your body uses up or destroys neutrophils faster than your bone marrow can make them. This problem may happen because of:    Bacterial or fungal infection.  Allergic disorders.  Reactions to some medicines.  Autoimmune disease.  An enlarged spleen. This condition can also occur if your bone marrow does not produce enough neutrophils. This problem may be caused by:  Cancer.  Cancer treatments, such as radiation or chemotherapy.  Viral infections.  Medicines, such as phenytoin.  Vitamin B12 deficiency.  Diseases of the bone marrow.  Environmental toxins, such as insecticides. What are the signs or symptoms? This condition does not usually cause symptoms. If symptoms are present, they are usually caused by an underlying infection. Symptoms of an infection may include:  Fever.  Chills.  Swollen  glands.  Oral or anal ulcers.  Cough and shortness of breath.  Rash.  Skin infection.  Fatigue. How is this diagnosed? Your health care provider may suspect neutropenia if you have:  A condition that may cause neutropenia.  Symptoms of infection, especially fever.  Frequent and unusual infections. You will have a medical history and physical exam. Tests will also be done, such as:  A complete blood count (CBC).  A procedure to collect a sample of bone marrow for examination (bone marrow biopsy).  A chest X-ray.  A urine culture.  A blood culture. How is this treated? Treatment depends on the underlying cause and severity of your condition. Mild neutropenia may not require treatment. Treatment may include medicines, such as:  Antibiotic medicine given through an IV tube.  Antiviral medicines.  Antifungal medicines.  A medicine to increase neutrophil production (colony-stimulating factor). You may get this drug through an IV tube or by injection.  Steroids given through an IV tube. If an underlying condition is causing neutropenia, you may need treatment for that condition. If medicines you are taking are causing neutropenia, your health care provider may have you stop taking those medicines. Follow these instructions at home: Medicines  Take over-the-counter and prescription medicines only as told by your health care provider.  Get a seasonal flu shot (influenza vaccine). Lifestyle  Do not eat unpasteurized foods.Do not eat unwashed raw fruits or vegetables.  Avoid exposure to groups of people or children.  Avoid being around people who are sick.  Avoid being around dirt or dust, such as in construction areas or gardens.  Do not provide direct care for pets. Avoid animal droppings. Do not clean litter boxes and bird cages. Hygiene   Bathe daily.  Clean the area between the genitals and the anus (perineal area) after you urinate or have a bowel movement.  If you are female, wipe from front to back.  Brush your teeth with a soft toothbrush before and after meals.  Do not use a razor that has a blade. Use an electric razor to remove hair.  Wash your hands often. Make sure others who come in contact with you also wash their hands. If soap and water are not available, use hand sanitizer. General instructions  Do not have sex unless your health care provider has approved.  Take actions to avoid cuts and burns. For example:  Be cautious when you use knives. Always cut away from yourself.  Keep knives in protective sheaths or guards when not in use.  Use oven mitts when you cook with a hot stove, oven, or grill.  Stand a safe distance away from open fires.  Avoid people who received a vaccine in the past 30 days if that vaccine contained a live version of the germ (live vaccine). You should not get a live vaccine. Common   live vaccines are varicella, measles, mumps, and rubella.  Do not share food utensils.  Do not use tampons, enemas, or rectal suppositories unless your health care provider has approved.  Keep all appointments as told by your health care provider. This is important. Contact a health care provider if:  You have a fever.  You have chills or you start to shake.  You have:  A sore throat.  A warm, red, or tender area on your skin.  A cough.  Frequent or painful urination.  Vaginal discharge or itching.  You develop:  Sores in your mouth or anus.  Swollen lymph nodes.  Red streaks on the skin.  A rash.  You feel:  Nauseous or you vomit.  Very fatigued.  Short of breath. This information is not intended to replace advice given to you by your health care provider. Make sure you discuss any questions you have with your health care provider. Document Released: 05/12/2002 Document Revised: 04/27/2016 Document Reviewed: 06/02/2015  2017 Elsevier  

## 2016-11-02 ENCOUNTER — Ambulatory Visit (HOSPITAL_BASED_OUTPATIENT_CLINIC_OR_DEPARTMENT_OTHER): Payer: Medicaid Other | Admitting: Nurse Practitioner

## 2016-11-02 VITALS — BP 140/88 | HR 86 | Temp 98.0°F | Resp 17 | Ht 64.0 in | Wt 226.6 lb

## 2016-11-02 DIAGNOSIS — C50411 Malignant neoplasm of upper-outer quadrant of right female breast: Secondary | ICD-10-CM | POA: Diagnosis not present

## 2016-11-02 DIAGNOSIS — R110 Nausea: Secondary | ICD-10-CM | POA: Diagnosis present

## 2016-11-02 DIAGNOSIS — Z95828 Presence of other vascular implants and grafts: Secondary | ICD-10-CM

## 2016-11-02 MED ORDER — SODIUM CHLORIDE 0.9 % IV SOLN
INTRAVENOUS | Status: DC
  Administered 2016-11-02: 11:00:00 via INTRAVENOUS

## 2016-11-02 NOTE — Patient Instructions (Signed)
Dehydration, Adult Dehydration is a condition in which there is not enough fluid or water in the body. This happens when you lose more fluids than you take in. Important organs, such as the kidneys, brain, and heart, cannot function without a proper amount of fluids. Any loss of fluids from the body can lead to dehydration. Dehydration can range from mild to severe. This condition should be treated right away to prevent it from becoming severe. What are the causes? This condition may be caused by:  Vomiting.  Diarrhea.  Excessive sweating, such as from heat exposure or exercise.  Not drinking enough fluid, especially:  When ill.  While doing activity that requires a lot of energy.  Excessive urination.  Fever.  Infection.  Certain medicines, such as medicines that cause the body to lose excess fluid (diuretics).  Inability to access safe drinking water.  Reduced physical ability to get adequate water and food. What increases the risk? This condition is more likely to develop in people:  Who have a poorly controlled long-term (chronic) illness, such as diabetes, heart disease, or kidney disease.  Who are age 65 or older.  Who are disabled.  Who live in a place with high altitude.  Who play endurance sports. What are the signs or symptoms? Symptoms of mild dehydration may include:   Thirst.  Dry lips.  Slightly dry mouth.  Dry, warm skin.  Dizziness. Symptoms of moderate dehydration may include:   Very dry mouth.  Muscle cramps.  Dark urine. Urine may be the color of tea.  Decreased urine production.  Decreased tear production.  Heartbeat that is irregular or faster than normal (palpitations).  Headache.  Light-headedness, especially when you stand up from a sitting position.  Fainting (syncope). Symptoms of severe dehydration may include:   Changes in skin, such as:  Cold and clammy skin.  Blotchy (mottled) or pale skin.  Skin that does  not quickly return to normal after being lightly pinched and released (poor skin turgor).  Changes in body fluids, such as:  Extreme thirst.  No tear production.  Inability to sweat when body temperature is high, such as in hot weather.  Very little urine production.  Changes in vital signs, such as:  Weak pulse.  Pulse that is more than 100 beats a minute when sitting still.  Rapid breathing.  Low blood pressure.  Other changes, such as:  Sunken eyes.  Cold hands and feet.  Confusion.  Lack of energy (lethargy).  Difficulty waking up from sleep.  Short-term weight loss.  Unconsciousness. How is this diagnosed? This condition is diagnosed based on your symptoms and a physical exam. Blood and urine tests may be done to help confirm the diagnosis. How is this treated? Treatment for this condition depends on the severity. Mild or moderate dehydration can often be treated at home. Treatment should be started right away. Do not wait until dehydration becomes severe. Severe dehydration is an emergency and it needs to be treated in a hospital. Treatment for mild dehydration may include:   Drinking more fluids.  Replacing salts and minerals in your blood (electrolytes) that you may have lost. Treatment for moderate dehydration may include:   Drinking an oral rehydration solution (ORS). This is a drink that helps you replace fluids and electrolytes (rehydrate). It can be found at pharmacies and retail stores. Treatment for severe dehydration may include:   Receiving fluids through an IV tube.  Receiving an electrolyte solution through a feeding tube that is   passed through your nose and into your stomach (nasogastric tube, or NG tube).  Correcting any abnormalities in electrolytes.  Treating the underlying cause of dehydration. Follow these instructions at home:  If directed by your health care provider, drink an ORS:  Make an ORS by following instructions on the  package.  Start by drinking small amounts, about  cup (120 mL) every 5-10 minutes.  Slowly increase how much you drink until you have taken the amount recommended by your health care provider.  Drink enough clear fluid to keep your urine clear or pale yellow. If you were told to drink an ORS, finish the ORS first, then start slowly drinking other clear fluids. Drink fluids such as:  Water. Do not drink only water. Doing that can lead to having too little salt (sodium) in the body (hyponatremia).  Ice chips.  Fruit juice that you have added water to (diluted fruit juice).  Low-calorie sports drinks.  Avoid:  Alcohol.  Drinks that contain a lot of sugar. These include high-calorie sports drinks, fruit juice that is not diluted, and soda.  Caffeine.  Foods that are greasy or contain a lot of fat or sugar.  Take over-the-counter and prescription medicines only as told by your health care provider.  Do not take sodium tablets. This can lead to having too much sodium in the body (hypernatremia).  Eat foods that contain a healthy balance of electrolytes, such as bananas, oranges, potatoes, tomatoes, and spinach.  Keep all follow-up visits as told by your health care provider. This is important. Contact a health care provider if:  You have abdominal pain that:  Gets worse.  Stays in one area (localizes).  You have a rash.  You have a stiff neck.  You are more irritable than usual.  You are sleepier or more difficult to wake up than usual.  You feel weak or dizzy.  You feel very thirsty.  You have urinated only a small amount of very dark urine over 6-8 hours. Get help right away if:  You have symptoms of severe dehydration.  You cannot drink fluids without vomiting.  Your symptoms get worse with treatment.  You have a fever.  You have a severe headache.  You have vomiting or diarrhea that:  Gets worse.  Does not go away.  You have blood or green matter  (bile) in your vomit.  You have blood in your stool. This may cause stool to look black and tarry.  You have not urinated in 6-8 hours.  You faint.  Your heart rate while sitting still is over 100 beats a minute.  You have trouble breathing. This information is not intended to replace advice given to you by your health care provider. Make sure you discuss any questions you have with your health care provider. Document Released: 11/20/2005 Document Revised: 06/16/2016 Document Reviewed: 01/14/2016 Elsevier Interactive Patient Education  2017 [employer]  

## 2016-11-03 ENCOUNTER — Ambulatory Visit (HOSPITAL_BASED_OUTPATIENT_CLINIC_OR_DEPARTMENT_OTHER): Payer: Medicaid Other | Admitting: Nurse Practitioner

## 2016-11-03 ENCOUNTER — Other Ambulatory Visit (HOSPITAL_BASED_OUTPATIENT_CLINIC_OR_DEPARTMENT_OTHER): Payer: Medicaid Other

## 2016-11-03 VITALS — BP 120/75 | HR 92 | Temp 98.4°F | Resp 18 | Ht 64.0 in | Wt 227.6 lb

## 2016-11-03 DIAGNOSIS — R110 Nausea: Secondary | ICD-10-CM | POA: Diagnosis not present

## 2016-11-03 DIAGNOSIS — Z9221 Personal history of antineoplastic chemotherapy: Secondary | ICD-10-CM

## 2016-11-03 DIAGNOSIS — C50411 Malignant neoplasm of upper-outer quadrant of right female breast: Secondary | ICD-10-CM | POA: Diagnosis not present

## 2016-11-03 DIAGNOSIS — Z95828 Presence of other vascular implants and grafts: Secondary | ICD-10-CM

## 2016-11-03 HISTORY — DX: Personal history of antineoplastic chemotherapy: Z92.21

## 2016-11-03 LAB — COMPREHENSIVE METABOLIC PANEL
ALBUMIN: 3.1 g/dL — AB (ref 3.5–5.0)
ALK PHOS: 111 U/L (ref 40–150)
ALT: 14 U/L (ref 0–55)
AST: 11 U/L (ref 5–34)
Anion Gap: 10 mEq/L (ref 3–11)
BUN: 6.9 mg/dL — AB (ref 7.0–26.0)
CALCIUM: 9.3 mg/dL (ref 8.4–10.4)
CO2: 22 mEq/L (ref 22–29)
CREATININE: 0.7 mg/dL (ref 0.6–1.1)
Chloride: 107 mEq/L (ref 98–109)
EGFR: >90 mL/min/{1.73_m2} (ref >90)
GLUCOSE: 95 mg/dL (ref 70–140)
POTASSIUM: 3.3 meq/L — AB (ref 3.5–5.1)
SODIUM: 138 meq/L (ref 136–145)
TOTAL PROTEIN: 6.7 g/dL (ref 6.4–8.3)

## 2016-11-03 LAB — CBC WITH DIFFERENTIAL/PLATELET
BASO%: 0.8 % (ref 0.0–2.0)
BASOS ABS: 0 10*3/uL (ref 0.0–0.1)
EOS ABS: 0 10*3/uL (ref 0.0–0.5)
EOS%: 0.7 % (ref 0.0–7.0)
HEMATOCRIT: 33.4 % — AB (ref 34.8–46.6)
HEMOGLOBIN: 11.3 g/dL — AB (ref 11.6–15.9)
LYMPH#: 0.6 10*3/uL — AB (ref 0.9–3.3)
LYMPH%: 23.9 % (ref 14.0–49.7)
MCH: 32.7 pg (ref 25.1–34.0)
MCHC: 33.9 g/dL (ref 31.5–36.0)
MCV: 96.5 fL (ref 79.5–101.0)
MONO#: 0.6 10*3/uL (ref 0.1–0.9)
MONO%: 24.4 % — AB (ref 0.0–14.0)
NEUT%: 50.2 % (ref 38.4–76.8)
NEUTROS ABS: 1.3 10*3/uL — AB (ref 1.5–6.5)
Platelets: 198 10*3/uL (ref 145–400)
RBC: 3.46 10*6/uL — ABNORMAL LOW (ref 3.70–5.45)
RDW: 15 % — AB (ref 11.2–14.5)
WBC: 2.5 10*3/uL — AB (ref 3.9–10.3)

## 2016-11-03 MED ORDER — SODIUM CHLORIDE 0.9 % IV SOLN
1000.0000 mL | INTRAVENOUS | Status: DC
  Administered 2016-11-03: 10:00:00 via INTRAVENOUS

## 2016-11-03 MED ORDER — PROCHLORPERAZINE EDISYLATE 5 MG/ML IJ SOLN: 10.0000 mg | Freq: Once | INTRAMUSCULAR | Status: DC

## 2016-11-03 NOTE — Patient Instructions (Signed)
Dehydration, Adult Dehydration is a condition in which there is not enough fluid or water in the body. This happens when you lose more fluids than you take in. Important organs, such as the kidneys, brain, and heart, cannot function without a proper amount of fluids. Any loss of fluids from the body can lead to dehydration. Dehydration can range from mild to severe. This condition should be treated right away to prevent it from becoming severe. What are the causes? This condition may be caused by:  Vomiting.  Diarrhea.  Excessive sweating, such as from heat exposure or exercise.  Not drinking enough fluid, especially:  When ill.  While doing activity that requires a lot of energy.  Excessive urination.  Fever.  Infection.  Certain medicines, such as medicines that cause the body to lose excess fluid (diuretics).  Inability to access safe drinking water.  Reduced physical ability to get adequate water and food. What increases the risk? This condition is more likely to develop in people:  Who have a poorly controlled long-term (chronic) illness, such as diabetes, heart disease, or kidney disease.  Who are age 65 or older.  Who are disabled.  Who live in a place with high altitude.  Who play endurance sports. What are the signs or symptoms? Symptoms of mild dehydration may include:   Thirst.  Dry lips.  Slightly dry mouth.  Dry, warm skin.  Dizziness. Symptoms of moderate dehydration may include:   Very dry mouth.  Muscle cramps.  Dark urine. Urine may be the color of tea.  Decreased urine production.  Decreased tear production.  Heartbeat that is irregular or faster than normal (palpitations).  Headache.  Light-headedness, especially when you stand up from a sitting position.  Fainting (syncope). Symptoms of severe dehydration may include:   Changes in skin, such as:  Cold and clammy skin.  Blotchy (mottled) or pale skin.  Skin that does  not quickly return to normal after being lightly pinched and released (poor skin turgor).  Changes in body fluids, such as:  Extreme thirst.  No tear production.  Inability to sweat when body temperature is high, such as in hot weather.  Very little urine production.  Changes in vital signs, such as:  Weak pulse.  Pulse that is more than 100 beats a minute when sitting still.  Rapid breathing.  Low blood pressure.  Other changes, such as:  Sunken eyes.  Cold hands and feet.  Confusion.  Lack of energy (lethargy).  Difficulty waking up from sleep.  Short-term weight loss.  Unconsciousness. How is this diagnosed? This condition is diagnosed based on your symptoms and a physical exam. Blood and urine tests may be done to help confirm the diagnosis. How is this treated? Treatment for this condition depends on the severity. Mild or moderate dehydration can often be treated at home. Treatment should be started right away. Do not wait until dehydration becomes severe. Severe dehydration is an emergency and it needs to be treated in a hospital. Treatment for mild dehydration may include:   Drinking more fluids.  Replacing salts and minerals in your blood (electrolytes) that you may have lost. Treatment for moderate dehydration may include:   Drinking an oral rehydration solution (ORS). This is a drink that helps you replace fluids and electrolytes (rehydrate). It can be found at pharmacies and retail stores. Treatment for severe dehydration may include:   Receiving fluids through an IV tube.  Receiving an electrolyte solution through a feeding tube that is   passed through your nose and into your stomach (nasogastric tube, or NG tube).  Correcting any abnormalities in electrolytes.  Treating the underlying cause of dehydration. Follow these instructions at home:  If directed by your health care provider, drink an ORS:  Make an ORS by following instructions on the  package.  Start by drinking small amounts, about  cup (120 mL) every 5-10 minutes.  Slowly increase how much you drink until you have taken the amount recommended by your health care provider.  Drink enough clear fluid to keep your urine clear or pale yellow. If you were told to drink an ORS, finish the ORS first, then start slowly drinking other clear fluids. Drink fluids such as:  Water. Do not drink only water. Doing that can lead to having too little salt (sodium) in the body (hyponatremia).  Ice chips.  Fruit juice that you have added water to (diluted fruit juice).  Low-calorie sports drinks.  Avoid:  Alcohol.  Drinks that contain a lot of sugar. These include high-calorie sports drinks, fruit juice that is not diluted, and soda.  Caffeine.  Foods that are greasy or contain a lot of fat or sugar.  Take over-the-counter and prescription medicines only as told by your health care provider.  Do not take sodium tablets. This can lead to having too much sodium in the body (hypernatremia).  Eat foods that contain a healthy balance of electrolytes, such as bananas, oranges, potatoes, tomatoes, and spinach.  Keep all follow-up visits as told by your health care provider. This is important. Contact a health care provider if:  You have abdominal pain that:  Gets worse.  Stays in one area (localizes).  You have a rash.  You have a stiff neck.  You are more irritable than usual.  You are sleepier or more difficult to wake up than usual.  You feel weak or dizzy.  You feel very thirsty.  You have urinated only a small amount of very dark urine over 6-8 hours. Get help right away if:  You have symptoms of severe dehydration.  You cannot drink fluids without vomiting.  Your symptoms get worse with treatment.  You have a fever.  You have a severe headache.  You have vomiting or diarrhea that:  Gets worse.  Does not go away.  You have blood or green matter  (bile) in your vomit.  You have blood in your stool. This may cause stool to look black and tarry.  You have not urinated in 6-8 hours.  You faint.  Your heart rate while sitting still is over 100 beats a minute.  You have trouble breathing. This information is not intended to replace advice given to you by your health care provider. Make sure you discuss any questions you have with your health care provider. Document Released: 11/20/2005 Document Revised: 06/16/2016 Document Reviewed: 01/14/2016 Elsevier Interactive Patient Education  2017 [employer]  

## 2016-11-06 ENCOUNTER — Encounter: Payer: Self-pay | Admitting: *Deleted

## 2016-11-06 ENCOUNTER — Other Ambulatory Visit: Payer: Self-pay | Admitting: Oncology

## 2016-11-06 ENCOUNTER — Ambulatory Visit (HOSPITAL_BASED_OUTPATIENT_CLINIC_OR_DEPARTMENT_OTHER): Payer: Medicaid Other

## 2016-11-06 ENCOUNTER — Other Ambulatory Visit (HOSPITAL_BASED_OUTPATIENT_CLINIC_OR_DEPARTMENT_OTHER): Payer: Medicaid Other

## 2016-11-06 VITALS — BP 116/77 | HR 100 | Temp 98.2°F | Resp 18

## 2016-11-06 DIAGNOSIS — Z5111 Encounter for antineoplastic chemotherapy: Secondary | ICD-10-CM

## 2016-11-06 DIAGNOSIS — D709 Neutropenia, unspecified: Secondary | ICD-10-CM

## 2016-11-06 DIAGNOSIS — C50411 Malignant neoplasm of upper-outer quadrant of right female breast: Secondary | ICD-10-CM

## 2016-11-06 DIAGNOSIS — C50919 Malignant neoplasm of unspecified site of unspecified female breast: Secondary | ICD-10-CM

## 2016-11-06 DIAGNOSIS — R5081 Fever presenting with conditions classified elsewhere: Principal | ICD-10-CM

## 2016-11-06 DIAGNOSIS — C773 Secondary and unspecified malignant neoplasm of axilla and upper limb lymph nodes: Secondary | ICD-10-CM

## 2016-11-06 LAB — COMPREHENSIVE METABOLIC PANEL
ALBUMIN: 3.1 g/dL — AB (ref 3.5–5.0)
ALK PHOS: 114 U/L (ref 40–150)
ALT: 12 U/L (ref 0–55)
ANION GAP: 9 meq/L (ref 3–11)
AST: 13 U/L (ref 5–34)
BUN: 6.6 mg/dL — AB (ref 7.0–26.0)
CALCIUM: 9.5 mg/dL (ref 8.4–10.4)
CO2: 22 mEq/L (ref 22–29)
CREATININE: 0.7 mg/dL (ref 0.6–1.1)
Chloride: 108 mEq/L (ref 98–109)
EGFR: >90 mL/min/{1.73_m2} (ref >90)
Glucose: 100 mg/dl (ref 70–140)
POTASSIUM: 3.7 meq/L (ref 3.5–5.1)
Sodium: 140 mEq/L (ref 136–145)
Total Bilirubin: <0.22 mg/dL (ref 0.20–1.20)
Total Protein: 6.8 g/dL (ref 6.4–8.3)

## 2016-11-06 LAB — CBC WITH DIFFERENTIAL/PLATELET
BASO%: 0.7 % (ref 0.0–2.0)
BASOS ABS: 0 10*3/uL (ref 0.0–0.1)
EOS%: 0.3 % (ref 0.0–7.0)
Eosinophils Absolute: 0 10*3/uL (ref 0.0–0.5)
HEMATOCRIT: 35.7 % (ref 34.8–46.6)
HEMOGLOBIN: 12.1 g/dL (ref 11.6–15.9)
LYMPH#: 0.5 10*3/uL — AB (ref 0.9–3.3)
LYMPH%: 11.5 % — ABNORMAL LOW (ref 14.0–49.7)
MCH: 32.9 pg (ref 25.1–34.0)
MCHC: 33.8 g/dL (ref 31.5–36.0)
MCV: 97.6 fL (ref 79.5–101.0)
MONO#: 0.5 10*3/uL (ref 0.1–0.9)
MONO%: 11.5 % (ref 0.0–14.0)
NEUT#: 3.2 10*3/uL (ref 1.5–6.5)
NEUT%: 76 % (ref 38.4–76.8)
PLATELETS: 327 10*3/uL (ref 145–400)
RBC: 3.66 10*6/uL — ABNORMAL LOW (ref 3.70–5.45)
RDW: 15.5 % — AB (ref 11.2–14.5)
WBC: 4.2 10*3/uL (ref 3.9–10.3)

## 2016-11-06 MED ORDER — PALONOSETRON HCL INJECTION 0.25 MG/5ML
0.2500 mg | Freq: Once | INTRAVENOUS | Status: AC
  Administered 2016-11-06: 0.25 mg via INTRAVENOUS

## 2016-11-06 MED ORDER — SODIUM CHLORIDE 0.9 % IV SOLN
Freq: Once | INTRAVENOUS | Status: AC
  Administered 2016-11-06: 09:00:00 via INTRAVENOUS
  Filled 2016-11-06: qty 5

## 2016-11-06 MED ORDER — SODIUM CHLORIDE 0.9 % IV SOLN
455.0000 mg/m2 | Freq: Once | INTRAVENOUS | Status: AC
  Administered 2016-11-06: 980 mg via INTRAVENOUS
  Filled 2016-11-06: qty 49

## 2016-11-06 MED ORDER — DOXORUBICIN HCL CHEMO IV INJECTION 2 MG/ML
45.0000 mg/m2 | Freq: Once | INTRAVENOUS | Status: AC
  Administered 2016-11-06: 98 mg via INTRAVENOUS
  Filled 2016-11-06: qty 49

## 2016-11-06 MED ORDER — SODIUM CHLORIDE 0.9 % IV SOLN
Freq: Once | INTRAVENOUS | Status: AC
  Administered 2016-11-06: 09:00:00 via INTRAVENOUS

## 2016-11-06 MED ORDER — PEGFILGRASTIM 6 MG/0.6ML ~~LOC~~ PSKT
6.0000 mg | PREFILLED_SYRINGE | Freq: Once | SUBCUTANEOUS | Status: AC
  Administered 2016-11-06: 6 mg via SUBCUTANEOUS
  Filled 2016-11-06: qty 0.6

## 2016-11-06 MED ORDER — PALONOSETRON HCL INJECTION 0.25 MG/5ML
INTRAVENOUS | Status: AC
  Filled 2016-11-06: qty 5

## 2016-11-06 NOTE — Progress Notes (Signed)
Dr. Jana Hakim came to chair side to discuss proceeding with last treatment today.  Plan to proceed with reduced dose, onpro and starting IVF on Thursday and MD recheck on Thursday and to continue fluids through next Wednesday to help keep patient well enough and to avoid hospitalization.

## 2016-11-06 NOTE — Progress Notes (Signed)
Patient started complaining of pain in Right ac during last 9 minutes of Cytoxan.  Assessed and slowed rate which decreased pain.  Reassessed after further infusion and patient stated pain was not 100% resolved but was better.  Checked and found positive blood return.  Continued infusion with heat pack and patient advised pain was resolved.  Provided 5 minute flush following infusion.

## 2016-11-06 NOTE — Patient Instructions (Signed)
[facility] Discharge Instructions for Patients Receiving Chemotherapy  Today you received the following chemotherapy agents Adriamycin, Cytoxan, OnPro  To help prevent nausea and vomiting after your treatment, we encourage you to take your nausea medication as directed.   If you develop nausea and vomiting that is not controlled by your nausea medication, call the clinic.   BELOW ARE SYMPTOMS THAT SHOULD BE REPORTED IMMEDIATELY:  *FEVER GREATER THAN 100.5 F  *CHILLS WITH OR WITHOUT FEVER  NAUSEA AND VOMITING THAT IS NOT CONTROLLED WITH YOUR NAUSEA MEDICATION  *UNUSUAL SHORTNESS OF BREATH  *UNUSUAL BRUISING OR BLEEDING  TENDERNESS IN MOUTH AND THROAT WITH OR WITHOUT PRESENCE OF ULCERS  *URINARY PROBLEMS  *BOWEL PROBLEMS  UNUSUAL RASH Items with * indicate a potential emergency and should be followed up as soon as possible.  Feel free to call the clinic you have any questions or concerns. The clinic phone number is [phone].  Please show the [facility] at check-in to the Emergency Department and triage nurse.

## 2016-11-06 NOTE — Progress Notes (Addendum)
Adriamycin administered through 22G in right ac over 19 minutes .  Blood return noted prior and during every 3 mls and at completion.  Patient no complaints and tolerated well.  Proceeded with next dose of chemo through same line after sufficient flush.

## 2016-11-08 ENCOUNTER — Telehealth: Payer: Self-pay | Admitting: Oncology

## 2016-11-08 ENCOUNTER — Other Ambulatory Visit: Payer: No Typology Code available for payment source

## 2016-11-08 ENCOUNTER — Ambulatory Visit: Payer: No Typology Code available for payment source

## 2016-11-08 ENCOUNTER — Telehealth: Payer: Self-pay | Admitting: Medical Oncology

## 2016-11-08 NOTE — Telephone Encounter (Signed)
"  I am supposed to have IVF the rest of this week on Thursday and Friday and all next week and nothing is scheduled."

## 2016-11-08 NOTE — Telephone Encounter (Signed)
lvm to inform pt of 12/7 appt date/times per LOS

## 2016-11-09 ENCOUNTER — Other Ambulatory Visit: Payer: No Typology Code available for payment source

## 2016-11-09 ENCOUNTER — Ambulatory Visit: Payer: No Typology Code available for payment source | Admitting: Nurse Practitioner

## 2016-11-09 ENCOUNTER — Ambulatory Visit: Payer: No Typology Code available for payment source | Admitting: Oncology

## 2016-11-10 ENCOUNTER — Ambulatory Visit: Payer: No Typology Code available for payment source

## 2016-11-11 ENCOUNTER — Ambulatory Visit: Payer: No Typology Code available for payment source

## 2016-11-11 ENCOUNTER — Encounter: Payer: Self-pay | Admitting: Oncology

## 2016-11-11 ENCOUNTER — Other Ambulatory Visit: Payer: Self-pay | Admitting: Oncology

## 2016-11-13 ENCOUNTER — Ambulatory Visit (HOSPITAL_BASED_OUTPATIENT_CLINIC_OR_DEPARTMENT_OTHER): Payer: Medicaid Other

## 2016-11-13 ENCOUNTER — Other Ambulatory Visit (HOSPITAL_BASED_OUTPATIENT_CLINIC_OR_DEPARTMENT_OTHER): Payer: Medicaid Other

## 2016-11-13 ENCOUNTER — Other Ambulatory Visit: Payer: Self-pay | Admitting: Oncology

## 2016-11-13 VITALS — BP 126/78 | HR 101 | Temp 98.5°F | Resp 18

## 2016-11-13 DIAGNOSIS — C50919 Malignant neoplasm of unspecified site of unspecified female breast: Secondary | ICD-10-CM

## 2016-11-13 DIAGNOSIS — C50411 Malignant neoplasm of upper-outer quadrant of right female breast: Secondary | ICD-10-CM

## 2016-11-13 DIAGNOSIS — C773 Secondary and unspecified malignant neoplasm of axilla and upper limb lymph nodes: Secondary | ICD-10-CM

## 2016-11-13 DIAGNOSIS — R110 Nausea: Secondary | ICD-10-CM | POA: Diagnosis present

## 2016-11-13 DIAGNOSIS — Z95828 Presence of other vascular implants and grafts: Secondary | ICD-10-CM

## 2016-11-13 LAB — COMPREHENSIVE METABOLIC PANEL
ALT: 14 U/L (ref 0–55)
AST: 8 U/L (ref 5–34)
Albumin: 3.5 g/dL (ref 3.5–5.0)
Alkaline Phosphatase: 136 U/L (ref 40–150)
Anion Gap: 12 mEq/L — ABNORMAL HIGH (ref 3–11)
BUN: 14.6 mg/dL (ref 7.0–26.0)
CHLORIDE: 104 meq/L (ref 98–109)
CO2: 22 mEq/L (ref 22–29)
Calcium: 9.8 mg/dL (ref 8.4–10.4)
Creatinine: 0.8 mg/dL (ref 0.6–1.1)
EGFR: 85 mL/min/{1.73_m2} — ABNORMAL LOW (ref >90)
GLUCOSE: 119 mg/dL (ref 70–140)
POTASSIUM: 3.7 meq/L (ref 3.5–5.1)
SODIUM: 137 meq/L (ref 136–145)
Total Bilirubin: <0.22 mg/dL (ref 0.20–1.20)
Total Protein: 7.2 g/dL (ref 6.4–8.3)

## 2016-11-13 LAB — CBC WITH DIFFERENTIAL/PLATELET
BASO%: 6.1 % — ABNORMAL HIGH (ref 0.0–2.0)
BASOS ABS: 0 10*3/uL (ref 0.0–0.1)
EOS%: 0 % (ref 0.0–7.0)
Eosinophils Absolute: 0 10*3/uL (ref 0.0–0.5)
HCT: 36.2 % (ref 34.8–46.6)
HGB: 12.2 g/dL (ref 11.6–15.9)
LYMPH%: 75.8 % — ABNORMAL HIGH (ref 14.0–49.7)
MCH: 32.5 pg (ref 25.1–34.0)
MCHC: 33.7 g/dL (ref 31.5–36.0)
MCV: 96.5 fL (ref 79.5–101.0)
MONO#: 0.1 10*3/uL (ref 0.1–0.9)
MONO%: 15.2 % — AB (ref 0.0–14.0)
NEUT#: 0 10*3/uL — CL (ref 1.5–6.5)
NEUT%: 2.9 % — AB (ref 38.4–76.8)
Platelets: 215 10*3/uL (ref 145–400)
RBC: 3.75 10*6/uL (ref 3.70–5.45)
RDW: 14.7 % — AB (ref 11.2–14.5)
WBC: 0.3 10*3/uL — CL (ref 3.9–10.3)
lymph#: 0.3 10*3/uL — ABNORMAL LOW (ref 0.9–3.3)

## 2016-11-13 LAB — TECHNOLOGIST REVIEW

## 2016-11-13 MED ORDER — SODIUM CHLORIDE 0.9 % IV SOLN
INTRAVENOUS | Status: DC
  Administered 2016-11-13: 14:00:00 via INTRAVENOUS

## 2016-11-13 NOTE — Patient Instructions (Signed)
Dehydration, Adult Dehydration is a condition in which there is not enough fluid or water in the body. This happens when you lose more fluids than you take in. Important organs, such as the kidneys, brain, and heart, cannot function without a proper amount of fluids. Any loss of fluids from the body can lead to dehydration. Dehydration can range from mild to severe. This condition should be treated right away to prevent it from becoming severe. What are the causes? This condition may be caused by:  Vomiting.  Diarrhea.  Excessive sweating, such as from heat exposure or exercise.  Not drinking enough fluid, especially:  When ill.  While doing activity that requires a lot of energy.  Excessive urination.  Fever.  Infection.  Certain medicines, such as medicines that cause the body to lose excess fluid (diuretics).  Inability to access safe drinking water.  Reduced physical ability to get adequate water and food. What increases the risk? This condition is more likely to develop in people:  Who have a poorly controlled long-term (chronic) illness, such as diabetes, heart disease, or kidney disease.  Who are age 65 or older.  Who are disabled.  Who live in a place with high altitude.  Who play endurance sports. What are the signs or symptoms? Symptoms of mild dehydration may include:   Thirst.  Dry lips.  Slightly dry mouth.  Dry, warm skin.  Dizziness. Symptoms of moderate dehydration may include:   Very dry mouth.  Muscle cramps.  Dark urine. Urine may be the color of tea.  Decreased urine production.  Decreased tear production.  Heartbeat that is irregular or faster than normal (palpitations).  Headache.  Light-headedness, especially when you stand up from a sitting position.  Fainting (syncope). Symptoms of severe dehydration may include:   Changes in skin, such as:  Cold and clammy skin.  Blotchy (mottled) or pale skin.  Skin that does  not quickly return to normal after being lightly pinched and released (poor skin turgor).  Changes in body fluids, such as:  Extreme thirst.  No tear production.  Inability to sweat when body temperature is high, such as in hot weather.  Very little urine production.  Changes in vital signs, such as:  Weak pulse.  Pulse that is more than 100 beats a minute when sitting still.  Rapid breathing.  Low blood pressure.  Other changes, such as:  Sunken eyes.  Cold hands and feet.  Confusion.  Lack of energy (lethargy).  Difficulty waking up from sleep.  Short-term weight loss.  Unconsciousness. How is this diagnosed? This condition is diagnosed based on your symptoms and a physical exam. Blood and urine tests may be done to help confirm the diagnosis. How is this treated? Treatment for this condition depends on the severity. Mild or moderate dehydration can often be treated at home. Treatment should be started right away. Do not wait until dehydration becomes severe. Severe dehydration is an emergency and it needs to be treated in a hospital. Treatment for mild dehydration may include:   Drinking more fluids.  Replacing salts and minerals in your blood (electrolytes) that you may have lost. Treatment for moderate dehydration may include:   Drinking an oral rehydration solution (ORS). This is a drink that helps you replace fluids and electrolytes (rehydrate). It can be found at pharmacies and retail stores. Treatment for severe dehydration may include:   Receiving fluids through an IV tube.  Receiving an electrolyte solution through a feeding tube that is   passed through your nose and into your stomach (nasogastric tube, or NG tube).  Correcting any abnormalities in electrolytes.  Treating the underlying cause of dehydration. Follow these instructions at home:  If directed by your health care provider, drink an ORS:  Make an ORS by following instructions on the  package.  Start by drinking small amounts, about  cup (120 mL) every 5-10 minutes.  Slowly increase how much you drink until you have taken the amount recommended by your health care provider.  Drink enough clear fluid to keep your urine clear or pale yellow. If you were told to drink an ORS, finish the ORS first, then start slowly drinking other clear fluids. Drink fluids such as:  Water. Do not drink only water. Doing that can lead to having too little salt (sodium) in the body (hyponatremia).  Ice chips.  Fruit juice that you have added water to (diluted fruit juice).  Low-calorie sports drinks.  Avoid:  Alcohol.  Drinks that contain a lot of sugar. These include high-calorie sports drinks, fruit juice that is not diluted, and soda.  Caffeine.  Foods that are greasy or contain a lot of fat or sugar.  Take over-the-counter and prescription medicines only as told by your health care provider.  Do not take sodium tablets. This can lead to having too much sodium in the body (hypernatremia).  Eat foods that contain a healthy balance of electrolytes, such as bananas, oranges, potatoes, tomatoes, and spinach.  Keep all follow-up visits as told by your health care provider. This is important. Contact a health care provider if:  You have abdominal pain that:  Gets worse.  Stays in one area (localizes).  You have a rash.  You have a stiff neck.  You are more irritable than usual.  You are sleepier or more difficult to wake up than usual.  You feel weak or dizzy.  You feel very thirsty.  You have urinated only a small amount of very dark urine over 6-8 hours. Get help right away if:  You have symptoms of severe dehydration.  You cannot drink fluids without vomiting.  Your symptoms get worse with treatment.  You have a fever.  You have a severe headache.  You have vomiting or diarrhea that:  Gets worse.  Does not go away.  You have blood or green matter  (bile) in your vomit.  You have blood in your stool. This may cause stool to look black and tarry.  You have not urinated in 6-8 hours.  You faint.  Your heart rate while sitting still is over 100 beats a minute.  You have trouble breathing. This information is not intended to replace advice given to you by your health care provider. Make sure you discuss any questions you have with your health care provider. Document Released: 11/20/2005 Document Revised: 06/16/2016 Document Reviewed: 01/14/2016 Elsevier Interactive Patient Education  2017 [employer]  

## 2016-11-14 ENCOUNTER — Other Ambulatory Visit: Payer: Self-pay | Admitting: Oncology

## 2016-11-14 ENCOUNTER — Ambulatory Visit (HOSPITAL_BASED_OUTPATIENT_CLINIC_OR_DEPARTMENT_OTHER): Payer: Medicaid Other | Admitting: Nurse Practitioner

## 2016-11-14 VITALS — BP 133/89 | HR 104 | Temp 97.9°F | Resp 17

## 2016-11-14 DIAGNOSIS — C50411 Malignant neoplasm of upper-outer quadrant of right female breast: Secondary | ICD-10-CM

## 2016-11-14 DIAGNOSIS — D709 Neutropenia, unspecified: Secondary | ICD-10-CM

## 2016-11-14 DIAGNOSIS — Z5189 Encounter for other specified aftercare: Secondary | ICD-10-CM | POA: Diagnosis not present

## 2016-11-14 DIAGNOSIS — R5081 Fever presenting with conditions classified elsewhere: Principal | ICD-10-CM

## 2016-11-14 MED ORDER — SODIUM CHLORIDE 0.9 % IV SOLN
Freq: Once | INTRAVENOUS | Status: AC
  Administered 2016-11-14: 11:00:00 via INTRAVENOUS

## 2016-11-14 NOTE — Progress Notes (Signed)
Pt has low grade temp this am, neutropenic with ANC of 0.0 on 11/13/16. Also c/o mild headache.  Dr. Jana Hakim notified.

## 2016-11-14 NOTE — Patient Instructions (Signed)
Dehydration, Adult Dehydration is a condition in which there is not enough fluid or water in the body. This happens when you lose more fluids than you take in. Important organs, such as the kidneys, brain, and heart, cannot function without a proper amount of fluids. Any loss of fluids from the body can lead to dehydration. Dehydration can range from mild to severe. This condition should be treated right away to prevent it from becoming severe. What are the causes? This condition may be caused by:  Vomiting.  Diarrhea.  Excessive sweating, such as from heat exposure or exercise.  Not drinking enough fluid, especially:  When ill.  While doing activity that requires a lot of energy.  Excessive urination.  Fever.  Infection.  Certain medicines, such as medicines that cause the body to lose excess fluid (diuretics).  Inability to access safe drinking water.  Reduced physical ability to get adequate water and food. What increases the risk? This condition is more likely to develop in people:  Who have a poorly controlled long-term (chronic) illness, such as diabetes, heart disease, or kidney disease.  Who are age 65 or older.  Who are disabled.  Who live in a place with high altitude.  Who play endurance sports. What are the signs or symptoms? Symptoms of mild dehydration may include:  Thirst.  Dry lips.  Slightly dry mouth.  Dry, warm skin.  Dizziness. Symptoms of moderate dehydration may include:  Very dry mouth.  Muscle cramps.  Dark urine. Urine may be the color of tea.  Decreased urine production.  Decreased tear production.  Heartbeat that is irregular or faster than normal (palpitations).  Headache.  Light-headedness, especially when you stand up from a sitting position.  Fainting (syncope). Symptoms of severe dehydration may include:  Changes in skin, such as:  Cold and clammy skin.  Blotchy (mottled) or pale skin.  Skin that does not  quickly return to normal after being lightly pinched and released (poor skin turgor).  Changes in body fluids, such as:  Extreme thirst.  No tear production.  Inability to sweat when body temperature is high, such as in hot weather.  Very little urine production.  Changes in vital signs, such as:  Weak pulse.  Pulse that is more than 100 beats a minute when sitting still.  Rapid breathing.  Low blood pressure.  Other changes, such as:  Sunken eyes.  Cold hands and feet.  Confusion.  Lack of energy (lethargy).  Difficulty waking up from sleep.  Short-term weight loss.  Unconsciousness. How is this diagnosed? This condition is diagnosed based on your symptoms and a physical exam. Blood and urine tests may be done to help confirm the diagnosis. How is this treated? Treatment for this condition depends on the severity. Mild or moderate dehydration can often be treated at home. Treatment should be started right away. Do not wait until dehydration becomes severe. Severe dehydration is an emergency and it needs to be treated in a hospital. Treatment for mild dehydration may include:  Drinking more fluids.  Replacing salts and minerals in your blood (electrolytes) that you may have lost. Treatment for moderate dehydration may include:  Drinking an oral rehydration solution (ORS). This is a drink that helps you replace fluids and electrolytes (rehydrate). It can be found at pharmacies and retail stores. Treatment for severe dehydration may include:  Receiving fluids through an IV tube.  Receiving an electrolyte solution through a feeding tube that is passed through your nose and into   your stomach (nasogastric tube, or NG tube).  Correcting any abnormalities in electrolytes.  Treating the underlying cause of dehydration. Follow these instructions at home:  If directed by your health care provider, drink an ORS:  Make an ORS by following instructions on the  package.  Start by drinking small amounts, about  cup (120 mL) every 5-10 minutes.  Slowly increase how much you drink until you have taken the amount recommended by your health care provider.  Drink enough clear fluid to keep your urine clear or pale yellow. If you were told to drink an ORS, finish the ORS first, then start slowly drinking other clear fluids. Drink fluids such as:  Water. Do not drink only water. Doing that can lead to having too little salt (sodium) in the body (hyponatremia).  Ice chips.  Fruit juice that you have added water to (diluted fruit juice).  Low-calorie sports drinks.  Avoid:  Alcohol.  Drinks that contain a lot of sugar. These include high-calorie sports drinks, fruit juice that is not diluted, and soda.  Caffeine.  Foods that are greasy or contain a lot of fat or sugar.  Take over-the-counter and prescription medicines only as told by your health care provider.  Do not take sodium tablets. This can lead to having too much sodium in the body (hypernatremia).  Eat foods that contain a healthy balance of electrolytes, such as bananas, oranges, potatoes, tomatoes, and spinach.  Keep all follow-up visits as told by your health care provider. This is important. Contact a health care provider if:  You have abdominal pain that:  Gets worse.  Stays in one area (localizes).  You have a rash.  You have a stiff neck.  You are more irritable than usual.  You are sleepier or more difficult to wake up than usual.  You feel weak or dizzy.  You feel very thirsty.  You have urinated only a small amount of very dark urine over 6-8 hours. Get help right away if:  You have symptoms of severe dehydration.  You cannot drink fluids without vomiting.  Your symptoms get worse with treatment.  You have a fever.  You have a severe headache.  You have vomiting or diarrhea that:  Gets worse.  Does not go away.  You have blood or green matter  (bile) in your vomit.  You have blood in your stool. This may cause stool to look black and tarry.  You have not urinated in 6-8 hours.  You faint.  Your heart rate while sitting still is over 100 beats a minute.  You have trouble breathing. This information is not intended to replace advice given to you by your health care provider. Make sure you discuss any questions you have with your health care provider. Document Released: 11/20/2005 Document Revised: 06/16/2016 Document Reviewed: 01/14/2016 Elsevier Interactive Patient Education  2017 [employer]    Neutropenia Introduction Neutropenia is a condition that occurs when you have a lower-than-normal level of a type of white blood cell (neutrophil) in your body. Neutrophils are made in the spongy center of large bones (bone marrow) and they fight infections. Neutrophils are your body's main defense against bacterial and fungal infections. The fewer neutrophils you have and the longer your body remains without them, the greater your risk of getting a severe infection. What are the causes? This condition can occur if your body uses up or destroys neutrophils faster than your bone marrow can make them. This problem may happen because of:    Bacterial or fungal infection.  Allergic disorders.  Reactions to some medicines.  Autoimmune disease.  An enlarged spleen. This condition can also occur if your bone marrow does not produce enough neutrophils. This problem may be caused by:  Cancer.  Cancer treatments, such as radiation or chemotherapy.  Viral infections.  Medicines, such as phenytoin.  Vitamin B12 deficiency.  Diseases of the bone marrow.  Environmental toxins, such as insecticides. What are the signs or symptoms? This condition does not usually cause symptoms. If symptoms are present, they are usually caused by an underlying infection. Symptoms of an infection may include:  Fever.  Chills.  Swollen  glands.  Oral or anal ulcers.  Cough and shortness of breath.  Rash.  Skin infection.  Fatigue. How is this diagnosed? Your health care provider may suspect neutropenia if you have:  A condition that may cause neutropenia.  Symptoms of infection, especially fever.  Frequent and unusual infections. You will have a medical history and physical exam. Tests will also be done, such as:  A complete blood count (CBC).  A procedure to collect a sample of bone marrow for examination (bone marrow biopsy).  A chest X-ray.  A urine culture.  A blood culture. How is this treated? Treatment depends on the underlying cause and severity of your condition. Mild neutropenia may not require treatment. Treatment may include medicines, such as:  Antibiotic medicine given through an IV tube.  Antiviral medicines.  Antifungal medicines.  A medicine to increase neutrophil production (colony-stimulating factor). You may get this drug through an IV tube or by injection.  Steroids given through an IV tube. If an underlying condition is causing neutropenia, you may need treatment for that condition. If medicines you are taking are causing neutropenia, your health care provider may have you stop taking those medicines. Follow these instructions at home: Medicines  Take over-the-counter and prescription medicines only as told by your health care provider.  Get a seasonal flu shot (influenza vaccine). Lifestyle  Do not eat unpasteurized foods.Do not eat unwashed raw fruits or vegetables.  Avoid exposure to groups of people or children.  Avoid being around people who are sick.  Avoid being around dirt or dust, such as in construction areas or gardens.  Do not provide direct care for pets. Avoid animal droppings. Do not clean litter boxes and bird cages. Hygiene   Bathe daily.  Clean the area between the genitals and the anus (perineal area) after you urinate or have a bowel movement.  If you are female, wipe from front to back.  Brush your teeth with a soft toothbrush before and after meals.  Do not use a razor that has a blade. Use an electric razor to remove hair.  Wash your hands often. Make sure others who come in contact with you also wash their hands. If soap and water are not available, use hand sanitizer. General instructions  Do not have sex unless your health care provider has approved.  Take actions to avoid cuts and burns. For example:  Be cautious when you use knives. Always cut away from yourself.  Keep knives in protective sheaths or guards when not in use.  Use oven mitts when you cook with a hot stove, oven, or grill.  Stand a safe distance away from open fires.  Avoid people who received a vaccine in the past 30 days if that vaccine contained a live version of the germ (live vaccine). You should not get a live vaccine. Common   live vaccines are varicella, measles, mumps, and rubella.  Do not share food utensils.  Do not use tampons, enemas, or rectal suppositories unless your health care provider has approved.  Keep all appointments as told by your health care provider. This is important. Contact a health care provider if:  You have a fever.  You have chills or you start to shake.  You have:  A sore throat.  A warm, red, or tender area on your skin.  A cough.  Frequent or painful urination.  Vaginal discharge or itching.  You develop:  Sores in your mouth or anus.  Swollen lymph nodes.  Red streaks on the skin.  A rash.  You feel:  Nauseous or you vomit.  Very fatigued.  Short of breath. This information is not intended to replace advice given to you by your health care provider. Make sure you discuss any questions you have with your health care provider. Document Released: 05/12/2002 Document Revised: 04/27/2016 Document Reviewed: 06/02/2015  2017 Elsevier  

## 2016-11-15 ENCOUNTER — Other Ambulatory Visit (HOSPITAL_BASED_OUTPATIENT_CLINIC_OR_DEPARTMENT_OTHER): Payer: Medicaid Other

## 2016-11-15 ENCOUNTER — Ambulatory Visit (HOSPITAL_BASED_OUTPATIENT_CLINIC_OR_DEPARTMENT_OTHER): Payer: Medicaid Other

## 2016-11-15 VITALS — BP 128/80 | HR 102 | Temp 98.6°F | Resp 18

## 2016-11-15 DIAGNOSIS — Z5189 Encounter for other specified aftercare: Secondary | ICD-10-CM

## 2016-11-15 DIAGNOSIS — Z95828 Presence of other vascular implants and grafts: Secondary | ICD-10-CM

## 2016-11-15 DIAGNOSIS — C50411 Malignant neoplasm of upper-outer quadrant of right female breast: Secondary | ICD-10-CM | POA: Diagnosis not present

## 2016-11-15 LAB — CBC WITH DIFFERENTIAL/PLATELET
BASO%: 1.3 % (ref 0.0–2.0)
Basophils Absolute: 0 10*3/uL (ref 0.0–0.1)
EOS%: 0.1 % (ref 0.0–7.0)
Eosinophils Absolute: 0 10*3/uL (ref 0.0–0.5)
HCT: 32.9 % — ABNORMAL LOW (ref 34.8–46.6)
HGB: 10.9 g/dL — ABNORMAL LOW (ref 11.6–15.9)
LYMPH%: 29.4 % (ref 14.0–49.7)
MCH: 32.2 pg (ref 25.1–34.0)
MCHC: 33 g/dL (ref 31.5–36.0)
MCV: 97.4 fL (ref 79.5–101.0)
MONO#: 0.4 10*3/uL (ref 0.1–0.9)
MONO%: 31.1 % — ABNORMAL HIGH (ref 0.0–14.0)
NEUT#: 0.4 10*3/uL — CL (ref 1.5–6.5)
NEUT%: 38.1 % — ABNORMAL LOW (ref 38.4–76.8)
Platelets: 247 10*3/uL (ref 145–400)
RBC: 3.37 10*6/uL — ABNORMAL LOW (ref 3.70–5.45)
RDW: 15.7 % — ABNORMAL HIGH (ref 11.2–14.5)
WBC: 1.2 10*3/uL — ABNORMAL LOW (ref 3.9–10.3)
lymph#: 0.3 10*3/uL — ABNORMAL LOW (ref 0.9–3.3)

## 2016-11-15 LAB — COMPREHENSIVE METABOLIC PANEL
ALBUMIN: 3.1 g/dL — AB (ref 3.5–5.0)
ALK PHOS: 116 U/L (ref 40–150)
ALT: 15 U/L (ref 0–55)
AST: 11 U/L (ref 5–34)
Anion Gap: 10 mEq/L (ref 3–11)
BUN: 8.3 mg/dL (ref 7.0–26.0)
CALCIUM: 9 mg/dL (ref 8.4–10.4)
CO2: 21 mEq/L — ABNORMAL LOW (ref 22–29)
CREATININE: 0.7 mg/dL (ref 0.6–1.1)
Chloride: 108 mEq/L (ref 98–109)
EGFR: >90 mL/min/{1.73_m2} (ref >90)
Glucose: 154 mg/dl — ABNORMAL HIGH (ref 70–140)
Potassium: 3.9 mEq/L (ref 3.5–5.1)
Sodium: 139 mEq/L (ref 136–145)
TOTAL PROTEIN: 6.5 g/dL (ref 6.4–8.3)

## 2016-11-15 MED ORDER — SODIUM CHLORIDE 0.9 % IV SOLN
INTRAVENOUS | Status: DC
  Administered 2016-11-15: 15:00:00 via INTRAVENOUS

## 2016-11-16 ENCOUNTER — Ambulatory Visit: Payer: No Typology Code available for payment source | Admitting: Nurse Practitioner

## 2016-11-17 ENCOUNTER — Ambulatory Visit: Payer: No Typology Code available for payment source | Admitting: Nurse Practitioner

## 2016-11-17 ENCOUNTER — Other Ambulatory Visit: Payer: No Typology Code available for payment source

## 2016-11-17 ENCOUNTER — Telehealth: Payer: Self-pay | Admitting: *Deleted

## 2016-11-17 NOTE — Telephone Encounter (Signed)
NO ENTRY 

## 2016-11-17 NOTE — Telephone Encounter (Signed)
Pt cancelled appt for labs and IVF. States she is feeling good and does not need fluids.

## 2016-11-23 ENCOUNTER — Ambulatory Visit
Admission: RE | Admit: 2016-11-23 | Discharge: 2016-11-23 | Disposition: A | Discharge status: Home / Self Care | Payer: Medicaid Other | Source: Ambulatory Visit | Attending: Oncology | Admitting: Oncology

## 2016-11-23 DIAGNOSIS — C50411 Malignant neoplasm of upper-outer quadrant of right female breast: Secondary | ICD-10-CM

## 2016-11-23 DIAGNOSIS — C50919 Malignant neoplasm of unspecified site of unspecified female breast: Secondary | ICD-10-CM

## 2016-11-23 DIAGNOSIS — C773 Secondary and unspecified malignant neoplasm of axilla and upper limb lymph nodes: Secondary | ICD-10-CM

## 2016-11-23 MED ORDER — GADOBENATE DIMEGLUMINE 529 MG/ML IV SOLN
20.0000 mL | Freq: Once | INTRAVENOUS | Status: AC | PRN
  Administered 2016-11-23: 20 mL via INTRAVENOUS

## 2016-11-25 NOTE — Progress Notes (Signed)
No show

## 2016-11-29 ENCOUNTER — Ambulatory Visit: Payer: No Typology Code available for payment source

## 2016-12-04 HISTORY — PX: BREAST LUMPECTOMY: SHX2

## 2016-12-06 ENCOUNTER — Ambulatory Visit: Payer: Self-pay | Admitting: General Surgery

## 2016-12-06 DIAGNOSIS — C50611 Malignant neoplasm of axillary tail of right female breast: Secondary | ICD-10-CM

## 2016-12-08 ENCOUNTER — Other Ambulatory Visit: Payer: Self-pay | Admitting: General Surgery

## 2016-12-08 DIAGNOSIS — C50611 Malignant neoplasm of axillary tail of right female breast: Secondary | ICD-10-CM

## 2016-12-11 ENCOUNTER — Telehealth: Payer: Self-pay | Admitting: *Deleted

## 2016-12-11 NOTE — Telephone Encounter (Signed)
Spoke with patient about her 1/15 appointment with Dr. Jana Hakim.  Her surgery is scheduled for 1/16.  Per Dr. Jana Hakim he does not need to see patient until after XRT complete.  Informed her of this and let her know she will get a call to schedule an appointment with Dr. Isidore Moos for 2 weeks after surgery.

## 2016-12-13 ENCOUNTER — Encounter (HOSPITAL_BASED_OUTPATIENT_CLINIC_OR_DEPARTMENT_OTHER): Payer: Self-pay | Admitting: *Deleted

## 2016-12-13 ENCOUNTER — Encounter: Payer: Self-pay | Admitting: Radiation Oncology

## 2016-12-18 ENCOUNTER — Ambulatory Visit: Payer: No Typology Code available for payment source | Admitting: Oncology

## 2016-12-18 ENCOUNTER — Other Ambulatory Visit: Payer: No Typology Code available for payment source

## 2016-12-18 ENCOUNTER — Other Ambulatory Visit: Payer: Self-pay | Admitting: Oncology

## 2016-12-18 DIAGNOSIS — G43919 Migraine, unspecified, intractable, without status migrainosus: Secondary | ICD-10-CM

## 2016-12-18 NOTE — Progress Notes (Signed)
Boost drink given with instructions to complete by 0700, pt verbalized understanding.

## 2016-12-19 ENCOUNTER — Encounter (HOSPITAL_BASED_OUTPATIENT_CLINIC_OR_DEPARTMENT_OTHER)
Admission: RE | Disposition: A | Discharge status: Home / Self Care | Payer: Self-pay | Source: Ambulatory Visit | Attending: General Surgery

## 2016-12-19 ENCOUNTER — Ambulatory Visit (HOSPITAL_BASED_OUTPATIENT_CLINIC_OR_DEPARTMENT_OTHER): Payer: Medicaid Other | Admitting: Certified Registered"

## 2016-12-19 ENCOUNTER — Ambulatory Visit (HOSPITAL_BASED_OUTPATIENT_CLINIC_OR_DEPARTMENT_OTHER)
Admission: RE | Admit: 2016-12-19 | Discharge: 2016-12-19 | Disposition: A | Discharge status: Home / Self Care | Payer: Medicaid Other | Source: Ambulatory Visit | Attending: General Surgery | Admitting: General Surgery

## 2016-12-19 ENCOUNTER — Encounter (HOSPITAL_BASED_OUTPATIENT_CLINIC_OR_DEPARTMENT_OTHER): Payer: Self-pay | Admitting: *Deleted

## 2016-12-19 ENCOUNTER — Ambulatory Visit
Admission: RE | Admit: 2016-12-19 | Discharge: 2016-12-19 | Disposition: A | Discharge status: Home / Self Care | Payer: Medicaid Other | Source: Ambulatory Visit | Attending: General Surgery | Admitting: General Surgery

## 2016-12-19 ENCOUNTER — Encounter (HOSPITAL_COMMUNITY)
Admission: RE | Admit: 2016-12-19 | Discharge: 2016-12-19 | Disposition: A | Discharge status: Home / Self Care | Payer: Medicaid Other | Source: Ambulatory Visit | Attending: General Surgery | Admitting: General Surgery

## 2016-12-19 ENCOUNTER — Other Ambulatory Visit: Payer: Self-pay | Admitting: General Surgery

## 2016-12-19 DIAGNOSIS — C50611 Malignant neoplasm of axillary tail of right female breast: Secondary | ICD-10-CM

## 2016-12-19 DIAGNOSIS — Z9221 Personal history of antineoplastic chemotherapy: Secondary | ICD-10-CM | POA: Diagnosis not present

## 2016-12-19 DIAGNOSIS — Z452 Encounter for adjustment and management of vascular access device: Secondary | ICD-10-CM | POA: Diagnosis not present

## 2016-12-19 DIAGNOSIS — C50411 Malignant neoplasm of upper-outer quadrant of right female breast: Secondary | ICD-10-CM | POA: Insufficient documentation

## 2016-12-19 DIAGNOSIS — F329 Major depressive disorder, single episode, unspecified: Secondary | ICD-10-CM | POA: Insufficient documentation

## 2016-12-19 DIAGNOSIS — Z170 Estrogen receptor positive status [ER+]: Secondary | ICD-10-CM | POA: Insufficient documentation

## 2016-12-19 DIAGNOSIS — Z87891 Personal history of nicotine dependence: Secondary | ICD-10-CM | POA: Insufficient documentation

## 2016-12-19 DIAGNOSIS — Z791 Long term (current) use of non-steroidal anti-inflammatories (NSAID): Secondary | ICD-10-CM | POA: Diagnosis not present

## 2016-12-19 DIAGNOSIS — K219 Gastro-esophageal reflux disease without esophagitis: Secondary | ICD-10-CM | POA: Diagnosis not present

## 2016-12-19 DIAGNOSIS — C773 Secondary and unspecified malignant neoplasm of axilla and upper limb lymph nodes: Secondary | ICD-10-CM | POA: Insufficient documentation

## 2016-12-19 DIAGNOSIS — E663 Overweight: Secondary | ICD-10-CM | POA: Diagnosis not present

## 2016-12-19 DIAGNOSIS — Z79899 Other long term (current) drug therapy: Secondary | ICD-10-CM | POA: Insufficient documentation

## 2016-12-19 DIAGNOSIS — G43909 Migraine, unspecified, not intractable, without status migrainosus: Secondary | ICD-10-CM | POA: Diagnosis not present

## 2016-12-19 DIAGNOSIS — Z6838 Body mass index (BMI) 38.0-38.9, adult: Secondary | ICD-10-CM | POA: Insufficient documentation

## 2016-12-19 DIAGNOSIS — C50911 Malignant neoplasm of unspecified site of right female breast: Secondary | ICD-10-CM | POA: Diagnosis present

## 2016-12-19 HISTORY — PX: RADIOACTIVE SEED GUIDED AXILLARY SENTINEL LYMPH NODE: SHX6735

## 2016-12-19 HISTORY — DX: Myoneural disorder, unspecified: G70.9

## 2016-12-19 HISTORY — PX: BREAST LUMPECTOMY WITH NEEDLE LOCALIZATION: SHX5759

## 2016-12-19 HISTORY — PX: PORT-A-CATH REMOVAL: SHX5289

## 2016-12-19 LAB — POCT HEMOGLOBIN-HEMACUE: HEMOGLOBIN: 12.8 g/dL (ref 12.0–15.0)

## 2016-12-19 SURGERY — BREAST LUMPECTOMY WITH NEEDLE LOCALIZATION
Anesthesia: General | Site: Chest | Laterality: Right

## 2016-12-19 MED ORDER — FENTANYL CITRATE (PF) 100 MCG/2ML IJ SOLN
INTRAMUSCULAR | Status: AC
  Filled 2016-12-19: qty 2

## 2016-12-19 MED ORDER — CELECOXIB 400 MG PO CAPS
400.0000 mg | ORAL_CAPSULE | ORAL | Status: AC
  Administered 2016-12-19: 400 mg via ORAL

## 2016-12-19 MED ORDER — MIDAZOLAM HCL 2 MG/2ML IJ SOLN
INTRAMUSCULAR | Status: AC
  Filled 2016-12-19: qty 2

## 2016-12-19 MED ORDER — BUPIVACAINE HCL (PF) 0.25 % IJ SOLN
INTRAMUSCULAR | Status: AC
  Filled 2016-12-19: qty 30

## 2016-12-19 MED ORDER — PROPOFOL 10 MG/ML IV BOLUS
INTRAVENOUS | Status: DC | PRN
  Administered 2016-12-19: 200 mg via INTRAVENOUS

## 2016-12-19 MED ORDER — CEFAZOLIN SODIUM-DEXTROSE 2-4 GM/100ML-% IV SOLN
INTRAVENOUS | Status: AC
  Filled 2016-12-19: qty 100

## 2016-12-19 MED ORDER — OXYCODONE HCL 5 MG PO TABS
ORAL_TABLET | ORAL | Status: AC
  Filled 2016-12-19: qty 1

## 2016-12-19 MED ORDER — BUPIVACAINE-EPINEPHRINE 0.5% -1:200000 IJ SOLN
INTRAMUSCULAR | Status: DC | PRN
  Administered 2016-12-19: 20 mL

## 2016-12-19 MED ORDER — MIDAZOLAM HCL 2 MG/2ML IJ SOLN
1.0000 mg | INTRAMUSCULAR | Status: DC | PRN
  Administered 2016-12-19 (×2): 1 mg via INTRAVENOUS
  Administered 2016-12-19: 2 mg via INTRAVENOUS

## 2016-12-19 MED ORDER — LIDOCAINE 2% (20 MG/ML) 5 ML SYRINGE
INTRAMUSCULAR | Status: DC | PRN
  Administered 2016-12-19: 50 mg via INTRAVENOUS

## 2016-12-19 MED ORDER — METHYLENE BLUE 0.5 % INJ SOLN
INTRAVENOUS | Status: AC
  Filled 2016-12-19: qty 10

## 2016-12-19 MED ORDER — SCOPOLAMINE 1 MG/3DAYS TD PT72: 1.0000 | MEDICATED_PATCH | Freq: Once | TRANSDERMAL | Status: DC | PRN

## 2016-12-19 MED ORDER — CEFAZOLIN SODIUM-DEXTROSE 2-4 GM/100ML-% IV SOLN
2.0000 g | INTRAVENOUS | Status: AC
  Administered 2016-12-19: 2 g via INTRAVENOUS

## 2016-12-19 MED ORDER — OXYCODONE HCL 5 MG PO TABS: 5.0000 mg | ORAL_TABLET | Freq: Four times a day (QID) | ORAL | 0 refills | Status: DC | PRN

## 2016-12-19 MED ORDER — CHLORHEXIDINE GLUCONATE CLOTH 2 % EX PADS: 6.0000 | MEDICATED_PAD | Freq: Once | CUTANEOUS | Status: DC

## 2016-12-19 MED ORDER — ACETAMINOPHEN 500 MG PO TABS
1000.0000 mg | ORAL_TABLET | ORAL | Status: AC
  Administered 2016-12-19: 1000 mg via ORAL

## 2016-12-19 MED ORDER — SODIUM CHLORIDE 0.9 % IJ SOLN
INTRAMUSCULAR | Status: DC | PRN
  Administered 2016-12-19: 5 mL

## 2016-12-19 MED ORDER — ONDANSETRON HCL 4 MG/2ML IJ SOLN
INTRAMUSCULAR | Status: AC
  Filled 2016-12-19: qty 2

## 2016-12-19 MED ORDER — DEXAMETHASONE SODIUM PHOSPHATE 4 MG/ML IJ SOLN
INTRAMUSCULAR | Status: DC | PRN
  Administered 2016-12-19: 10 mg via INTRAVENOUS

## 2016-12-19 MED ORDER — FENTANYL CITRATE (PF) 100 MCG/2ML IJ SOLN
50.0000 ug | INTRAMUSCULAR | Status: AC | PRN
  Administered 2016-12-19 (×5): 50 ug via INTRAVENOUS

## 2016-12-19 MED ORDER — LIDOCAINE 2% (20 MG/ML) 5 ML SYRINGE
INTRAMUSCULAR | Status: AC
  Filled 2016-12-19: qty 5

## 2016-12-19 MED ORDER — TECHNETIUM TC 99M SULFUR COLLOID FILTERED
1.0000 | Freq: Once | INTRAVENOUS | Status: AC | PRN
  Administered 2016-12-19: 1 via INTRADERMAL

## 2016-12-19 MED ORDER — DEXAMETHASONE SODIUM PHOSPHATE 10 MG/ML IJ SOLN
INTRAMUSCULAR | Status: AC
  Filled 2016-12-19: qty 1

## 2016-12-19 MED ORDER — SODIUM CHLORIDE 0.9 % IJ SOLN
INTRAMUSCULAR | Status: AC
  Filled 2016-12-19: qty 10

## 2016-12-19 MED ORDER — CELECOXIB 200 MG PO CAPS
ORAL_CAPSULE | ORAL | Status: AC
  Filled 2016-12-19: qty 2

## 2016-12-19 MED ORDER — ACETAMINOPHEN 500 MG PO TABS
ORAL_TABLET | ORAL | Status: AC
  Filled 2016-12-19: qty 2

## 2016-12-19 MED ORDER — OXYCODONE HCL 5 MG PO TABS
5.0000 mg | ORAL_TABLET | Freq: Once | ORAL | Status: AC
  Administered 2016-12-19: 5 mg via ORAL

## 2016-12-19 MED ORDER — FENTANYL CITRATE (PF) 100 MCG/2ML IJ SOLN
25.0000 ug | INTRAMUSCULAR | Status: DC | PRN
  Administered 2016-12-19 (×3): 50 ug via INTRAVENOUS

## 2016-12-19 MED ORDER — LACTATED RINGERS IV SOLN
INTRAVENOUS | Status: DC
  Administered 2016-12-19 (×2): via INTRAVENOUS

## 2016-12-19 MED ORDER — BUPIVACAINE-EPINEPHRINE (PF) 0.5% -1:200000 IJ SOLN
INTRAMUSCULAR | Status: AC
  Filled 2016-12-19: qty 30

## 2016-12-19 SURGICAL SUPPLY — 58 items
ADH SKN CLS APL DERMABOND .7 (GAUZE/BANDAGES/DRESSINGS) ×6
APPLIER CLIP 9.375 MED OPEN (MISCELLANEOUS) ×4
APR CLP MED 9.3 20 MLT OPN (MISCELLANEOUS) ×3
BLADE SURG 15 STRL LF DISP TIS (BLADE) ×3 IMPLANT
BLADE SURG 15 STRL SS (BLADE) ×8
CANISTER SUCT 1200ML W/VALVE (MISCELLANEOUS) ×4 IMPLANT
CHLORAPREP W/TINT 26ML (MISCELLANEOUS) ×4 IMPLANT
CLIP APPLIE 9.375 MED OPEN (MISCELLANEOUS) ×3 IMPLANT
COVER BACK TABLE 60X90IN (DRAPES) ×4 IMPLANT
COVER MAYO STAND STRL (DRAPES) ×4 IMPLANT
DECANTER SPIKE VIAL GLASS SM (MISCELLANEOUS) IMPLANT
DERMABOND ADVANCED (GAUZE/BANDAGES/DRESSINGS) ×2
DERMABOND ADVANCED .7 DNX12 (GAUZE/BANDAGES/DRESSINGS) ×3 IMPLANT
DEVICE DUBIN W/COMP PLATE 8390 (MISCELLANEOUS) ×4 IMPLANT
DRAIN CHANNEL 19F RND (DRAIN) ×4 IMPLANT
DRAPE LAPAROSCOPIC ABDOMINAL (DRAPES) ×4 IMPLANT
DRAPE LAPAROTOMY T 102X78X121 (DRAPES) ×3 IMPLANT
DRAPE UTILITY XL STRL (DRAPES) ×4 IMPLANT
ELECT COATED BLADE 2.86 ST (ELECTRODE) ×4 IMPLANT
ELECT REM PT RETURN 9FT ADLT (ELECTROSURGICAL) ×4
ELECTRODE REM PT RTRN 9FT ADLT (ELECTROSURGICAL) ×3 IMPLANT
EVACUATOR SILICONE 100CC (DRAIN) ×4 IMPLANT
GLOVE BIOGEL PI IND STRL 7.0 (GLOVE) IMPLANT
GLOVE BIOGEL PI IND STRL 8 (GLOVE) ×3 IMPLANT
GLOVE BIOGEL PI INDICATOR 7.0 (GLOVE) ×1
GLOVE BIOGEL PI INDICATOR 8 (GLOVE) ×1
GLOVE ECLIPSE 7.5 STRL STRAW (GLOVE) ×5 IMPLANT
GLOVE EXAM NITRILE EXT CUFF MD (GLOVE) ×1 IMPLANT
GLOVE SURG SS PI 7.0 STRL IVOR (GLOVE) ×1 IMPLANT
GLOVE SURG SYN 7.5  E (GLOVE) ×1
GLOVE SURG SYN 7.5 E (GLOVE) ×3 IMPLANT
GLOVE SURG SYN 7.5 PF PI (GLOVE) IMPLANT
GOWN STRL REUS W/ TWL LRG LVL3 (GOWN DISPOSABLE) ×3 IMPLANT
GOWN STRL REUS W/ TWL XL LVL3 (GOWN DISPOSABLE) ×3 IMPLANT
GOWN STRL REUS W/TWL LRG LVL3 (GOWN DISPOSABLE) ×12
GOWN STRL REUS W/TWL XL LVL3 (GOWN DISPOSABLE) ×8
ILLUMINATOR WAVEGUIDE N/F (MISCELLANEOUS) IMPLANT
KIT MARKER MARGIN INK (KITS) ×4 IMPLANT
KIT PROBE COVER (MISCELLANEOUS) ×1 IMPLANT
NDL HYPO 25X1 1.5 SAFETY (NEEDLE) ×6 IMPLANT
NEEDLE HYPO 25X1 1.5 SAFETY (NEEDLE) ×8 IMPLANT
PACK BASIN DAY SURGERY FS (CUSTOM PROCEDURE TRAY) ×4 IMPLANT
PENCIL BUTTON HOLSTER BLD 10FT (ELECTRODE) ×4 IMPLANT
SLEEVE SCD COMPRESS KNEE MED (MISCELLANEOUS) ×4 IMPLANT
SPONGE LAP 4X18 X RAY DECT (DISPOSABLE) ×4 IMPLANT
SUT ETHILON 3 0 PS 1 (SUTURE) ×4 IMPLANT
SUT MNCRL AB 4-0 PS2 18 (SUTURE) ×4 IMPLANT
SUT MON AB 4-0 PC3 18 (SUTURE) ×4 IMPLANT
SUT SILK 4 0 TIES 17X18 (SUTURE) IMPLANT
SUT VIC AB 3-0 54X BRD REEL (SUTURE) ×3 IMPLANT
SUT VIC AB 3-0 BRD 54 (SUTURE) ×4
SUT VICRYL 3-0 CR8 SH (SUTURE) ×5 IMPLANT
SYR BULB 3OZ (MISCELLANEOUS) IMPLANT
SYR CONTROL 10ML LL (SYRINGE) ×8 IMPLANT
TOWEL OR 17X24 6PK STRL BLUE (TOWEL DISPOSABLE) ×8 IMPLANT
TOWEL OR NON WOVEN STRL DISP B (DISPOSABLE) ×3 IMPLANT
TUBE CONNECTING 20X1/4 (TUBING) ×4 IMPLANT
YANKAUER SUCT BULB TIP NO VENT (SUCTIONS) ×4 IMPLANT

## 2016-12-19 NOTE — Anesthesia Preprocedure Evaluation (Addendum)
Anesthesia Evaluation  Patient identified by MRN, date of birth, ID band Patient awake    Reviewed: Allergy & Precautions, NPO status   Airway Mallampati: II  TM Distance: >3 FB     Dental   Pulmonary former smoker,    breath sounds clear to auscultation       Cardiovascular negative cardio ROS   Rhythm:Regular Rate:Normal     Neuro/Psych  Headaches,  Neuromuscular disease    GI/Hepatic Neg liver ROS, GERD  ,  Endo/Other    Renal/GU negative Renal ROS     Musculoskeletal  (+) Arthritis , Fibromyalgia -  Abdominal   Peds  Hematology   Anesthesia Other Findings   Reproductive/Obstetrics                             Anesthesia Physical Anesthesia Plan  ASA: III  Anesthesia Plan: General   Post-op Pain Management:    Induction: Intravenous  Airway Management Planned: LMA  Additional Equipment:   Intra-op Plan:   Post-operative Plan:   Informed Consent: I have reviewed the patients History and Physical, chart, labs and discussed the procedure including the risks, benefits and alternatives for the proposed anesthesia with the patient or authorized representative who has indicated his/her understanding and acceptance.   Dental advisory given  Plan Discussed with: CRNA and Anesthesiologist  Anesthesia Plan Comments:         Anesthesia Quick Evaluation

## 2016-12-19 NOTE — Op Note (Signed)
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 She had previously undergone biopsy of the primary tumor and one lymph node which was positive. She had a high mammaprint score and has received neoadjuvant chemotherapy.  She had difficulty tolerating her treatment but received the majority of her neoadjuvant treatment. Subsequent MRI has shown reduction of her primary tumor by about half down to about 2.5 cm and normalization of her axillary lymph nodes. After extensive discussion regarding alternatives and risks detailed elsewhere we have elected proceed with lumpectomy and radioactive seed targeted axillary lymph node biopsy. The tumor is in the extreme tail of Spence of breast and wire localization as planned here as the seed in the node will be very close.  We also plan removal of Port-A-Cath.    Procedure Detail:  Patient had previously undergone accurate placement of a wire through the area of the primary tumor and clip site and placement of a radioactive seed at the site of the known primary tumor which had been marked with a hydro-mark clip. She was brought to the operating room, placed in the supine position on the operating table and laryngeal mask general anesthesia induced. She was carefully positioned with her right arm extended. 5 mL of dilute methylene blue was injected under sterile technique the right nipple and massaged for several  minutes after performing patient timeout. Following this the entire right breast and chest, axilla and upper arm were widely sterilely prepped and draped. Patient timeout was Performed. PAS replacement place and she received preoperative IV antibiotics. The wire insertion site was essentially in the axilla and I elected to use a single low axillary incision for the primary tumor and lymph nodes. A fairly generous transverse incision was made in a skin crease near the wire insertion site and dissection carried down through the subcutaneous tissue. The wire was brought into the insertion site. Using the wire for guidance a generous specimen of breast tissue was excised around the shaft and the tip of the wire with cautery. The tumor lay essentially just superior and lateral to the axilla. As I was coming across the medial and posterior lumpectomy specimen the neoprobe showed the seed in the lymph node to be just adjacent to the specimen. I continued to dissecting a little more medial and inferior in the lymph node was included with the tumor in a single specimen. The specimen was excised and inked for margins. It was a proximally 5 cm in diameter. Specimen x-ray was obtained showing the wire centrally located with the tumor marking clip very centrally located and the seed and clip in the lymph node at the periphery of the specimen medially. This was sent for permanent pathology. Examination of the lumpectomy cavity revealed one nodular area very discrete at the inferior margin of the lumpectomy cavity and I reexcised this margin back to normal-appearing tissue which was oriented with ink and sent as a separate inferior margin.  At this point through this incision I had very wide exposure of the axilla. I  extensively explored the area with the neoprobe and there was absolutely 0 counts per present in the axilla with very high counts present around the nipple. The axilla was thoroughly explored. I did not see any blue  dye. There were 3 slightly nodular areas, possible lymph nodes all separated in the inferior axilla and each of these were excised and sent as axillary tissue. I did dissect through the axillary tissue fairly extensively and did not see any blue dye and on repeated scanning there were 0 counts. There were no palpable identifiable lymph nodes. I felt we had thoroughly sampled the area with the tissue I removed them without any other palpable abnormalities I elected to stop at this point. The wound was irrigated and complete hemostasis obtained. The soft tissue was infiltrated with Marcaine with epinephrine. The lumpectomy cavity was marked with clips. Due to the fairly large incision with the combined axillary exploration and lumpectomy elected to leave a 68 Blake close suction drain in the wound which was brought out through a separate inferior stab wound. The deep axillary and breast tissue was closed with interrupted 3-0 Vicryl and the skin with running subcuticular 5-0 Monocryl and Dermabond. Following this the Port-A-Cath was removed. The previous incision was used and dissection was carried down onto the port. The suture material was removed and the hub and catheter and port all removed intact. The septae stitch was closed with interrupted 3-0 Vicryl and subcutaneous testicular tissue with running 5-0 Monocryl and Dermabond. Sponge and needle and instrument counts were correct.    Findings: As above  Estimated Blood Loss:  Minimal         Drains: 19 round Blake  Blood Given: none          Specimens: #1 right breast tissue (wire localized) and axillary lymph node (radioactive seed localized)  #2 further inferior breast margin with nodular breast tissue    #3-5  axillary tissue, possible lymph nodes        Complications:  * No complications entered in OR log *         Disposition: PACU - hemodynamically stable.         Condition: stable

## 2016-12-19 NOTE — Discharge Instructions (Signed)
Post Anesthesia Home Care Instructions  Activity: Get plenty of rest for the remainder of the day. A responsible adult should stay with you for 24 hours following the procedure.  For the next 24 hours, DO NOT: -Drive a car -Paediatric nurse -Drink alcoholic beverages -Take any medication unless instructed by your physician -Make any legal decisions or sign important papers.  Meals: Start with liquid foods such as gelatin or soup. Progress to regular foods as tolerated. Avoid greasy, spicy, heavy foods. If nausea and/or vomiting occur, drink only clear liquids until the nausea and/or vomiting subsides. Call your physician if vomiting continues.  Special Instructions/Symptoms: Your throat may feel dry or sore from the anesthesia or the breathing tube placed in your throat during surgery. If this causes discomfort, gargle with warm salt water. The discomfort should disappear within 24 hours.  If you had a scopolamine patch placed behind your ear for the management of post- operative nausea and/or vomiting:  1. The medication in the patch is effective for 72 hours, after which it should be removed.  Wrap patch in a tissue and discard in the trash. Wash hands thoroughly with soap and water. 2. You may remove the patch earlier than 72 hours if you experience unpleasant side effects which may include dry mouth, dizziness or visual disturbances. 3. Avoid touching the patch. Wash your hands with soap and water after contact with the patch.      About my Jackson-Pratt Bulb Drain  What is a Jackson-Pratt bulb? A Jackson-Pratt is a soft, round device used to collect drainage. It is connected to a long, thin drainage catheter, which is held in place by one or two small stiches near your surgical incision site. When the bulb is squeezed, it forms a vacuum, forcing the drainage to empty into the bulb.  Emptying the Jackson-Pratt bulb- To empty the bulb: 1. Release the plug on the top of the  bulb. 2. Pour the bulb's contents into a measuring container which your nurse will provide. 3. Record the time emptied and amount of drainage. Empty the drain(s) as often as your     doctor or nurse recommends.  Date                  Time                    Amount (Drain 1)                 Amount (Drain 2)  _____________________________________________________________________  _____________________________________________________________________  _____________________________________________________________________  _____________________________________________________________________  _____________________________________________________________________  _____________________________________________________________________  _____________________________________________________________________  _____________________________________________________________________  Squeezing the Jackson-Pratt Bulb- To squeeze the bulb: 1. Make sure the plug at the top of the bulb is open. 2. Squeeze the bulb tightly in your fist. You will hear air squeezing from the bulb. 3. Replace the plug while the bulb is squeezed. 4. Use a safety pin to attach the bulb to your clothing. This will keep the catheter from     pulling at the bulb insertion site.  When to call your doctor- Call your doctor if:  Drain site becomes red, swollen or hot.  You have a fever greater than 101 degrees F.  There is oozing at the drain site.  Drain falls out (apply a guaze bandage over the drain hole and secure it with tape).  Drainage increases daily not related to activity patterns. (You will usually have more drainage when you are active than when you are resting.)  Drainage  has a bad odor.     [place] Office Phone Number [phone]  BREAST BIOPSY/ PARTIAL MASTECTOMY: POST OP INSTRUCTIONS  Always review your discharge instruction sheet given to you by the facility where your surgery  was performed.  IF YOU HAVE DISABILITY OR FAMILY LEAVE FORMS, YOU MUST BRING THEM TO THE OFFICE FOR PROCESSING.  DO NOT GIVE THEM TO YOUR DOCTOR.  1. A prescription for pain medication may be given to you upon discharge.  Take your pain medication as prescribed, if needed.  If narcotic pain medicine is not needed, then you may take acetaminophen (Tylenol) or ibuprofen (Advil) as needed. 2. Take your usually prescribed medications unless otherwise directed 3. If you need a refill on your pain medication, please contact your pharmacy.  They will contact our office to request authorization.  Prescriptions will not be filled after 5pm or on week-ends. 4. You should eat very light the first 24 hours after surgery, such as soup, crackers, pudding, etc.  Resume your normal diet the day after surgery. 5. Most patients will experience some swelling and bruising in the breast.  Ice packs and a good support bra will help.  Swelling and bruising can take several days to resolve.  6. It is common to experience some constipation if taking pain medication after surgery.  Increasing fluid intake and taking a stool softener will usually help or prevent this problem from occurring.  A mild laxative (Milk of Magnesia or Miralax) should be taken according to package directions if there are no bowel movements after 48 hours. 7. Unless discharge instructions indicate otherwise, you may remove your bandages 24-48 hours after surgery, and you may shower at that time.  You may have steri-strips (small skin tapes) in place directly over the incision.  These strips should be left on the skin for 7-10 days.  If your surgeon used skin glue on the incision, you may shower in 24 hours.  The glue will flake off over the next 2-3 weeks.  Any sutures or staples will be removed at the office during your follow-up visit. 8. ACTIVITIES:  You may resume regular daily activities (gradually increasing) beginning the next day.  Wearing a good  support bra or sports bra minimizes pain and swelling.  You may have sexual intercourse when it is comfortable. a. You may drive when you no longer are taking prescription pain medication, you can comfortably wear a seatbelt, and you can safely maneuver your car and apply brakes. b. RETURN TO WORK:  ______________________________________________________________________________________ 9. You should see your doctor in the office for a follow-up appointment approximately two weeks after your surgery.  Your doctors nurse will typically make your follow-up appointment when she calls you with your pathology report.  Expect your pathology report 2-3 business days after your surgery.  You may call to check if you do not hear from [name] after three days. 10. OTHER INSTRUCTIONS: _______________________________________________________________________________________________ _____________________________________________________________________________________________________________________________________ _____________________________________________________________________________________________________________________________________ _____________________________________________________________________________________________________________________________________  WHEN TO CALL YOUR DOCTOR: 1. Fever over 101.0 2. Nausea and/or vomiting. 3. Extreme swelling or bruising. 4. Continued bleeding from incision. 5. Increased pain, redness, or drainage from the incision.  The clinic staff is available to answer your questions during regular business hours.  Please dont hesitate to call and ask to speak to one of the nurses for clinical concerns.  If you have a medical emergency, go to the nearest emergency room or call 911.  A surgeon from [facility] is always on call at the hospital.  For further questions, please visit [email or web address]

## 2016-12-19 NOTE — Anesthesia Procedure Notes (Signed)
Procedure Name: LMA Insertion Date/Time: 12/19/2016 11:27 AM Performed by: Tracye Szuch D Pre-anesthesia Checklist: Patient identified, Emergency Drugs available, Suction available and Patient being monitored Patient Re-evaluated:Patient Re-evaluated prior to inductionOxygen Delivery Method: Circle system utilized Preoxygenation: Pre-oxygenation with 100% oxygen Intubation Type: IV induction Ventilation: Mask ventilation without difficulty LMA: LMA inserted LMA Size: 4.0 Number of attempts: 1 Airway Equipment and Method: Bite block Placement Confirmation: positive ETCO2 Tube secured with: Tape Dental Injury: Teeth and Oropharynx as per pre-operative assessment

## 2016-12-19 NOTE — Interval H&P Note (Signed)
History and Physical Interval Note:  12/19/2016 9:47 AM  April [name]  has presented today for surgery, with the diagnosis of RIGHT BREAST CANCER  The various methods of treatment have been discussed with the patient and family. After consideration of risks, benefits and other options for treatment, the patient has consented to  Procedure(s): RIGHT BREAST NEEDLE LOCALIZED LUMPECTOMY, RIGHT AXILLARY SENTINEL LYMPH NODE BIOPSY, RIGHT SEED TARGETED AXILLARY DISSECTION (Right) REMOVAL PORT-A-CATH (N/A) as a surgical intervention .  The patient's history has been reviewed, patient examined, no change in status, stable for surgery.  I have reviewed the patient's chart and labs.  Questions were answered to the patient's satisfaction.     Donyale Berthold T

## 2016-12-19 NOTE — Progress Notes (Signed)
Assisted Dr. Edwards with right, ultrasound guided, pectoralis block. Side rails up, monitors on throughout procedure. See vital signs in flow sheet. Tolerated Procedure well. 

## 2016-12-19 NOTE — Anesthesia Procedure Notes (Signed)
Anesthesia Regional Block:  Pectoralis block  Pre-Anesthetic Checklist: ,, timeout performed, Correct Patient, Correct Site, Correct Laterality, Correct Procedure, Correct Position, site marked, Risks and benefits discussed,  Surgical consent,  Pre-op evaluation,  At surgeon's request and post-op pain management  Laterality: Right  Prep: chloraprep       Needles:   Needle Type: Stimulator Needle - 40          Additional Needles: Pectoralis block Narrative:  Start time: 12/19/2016 10:00 AM End time: 12/19/2016 10:15 AM Injection made incrementally with aspirations every 5 mL.  Performed by: Personally  Anesthesiologist: Belinda Block

## 2016-12-19 NOTE — Progress Notes (Signed)
Emotional support for breast injections with nuclear medicine

## 2016-12-19 NOTE — Transfer of Care (Signed)
Immediate Anesthesia Transfer of Care Note  Patient: April [name]  Procedure(s) Performed: Procedure(s): RIGHT BREAST NEEDLE LOCALIZED LUMPECTOMY, RIGHT RADIOACTIVE SEED TARGETED AXILLARY SENTINEL LYMPH NODE BIOPSY (Right) REMOVAL PORT-A-CATH (Left)  Patient Location: PACU  Anesthesia Type:GA combined with regional for post-op pain  Level of Consciousness: awake and patient cooperative  Airway & Oxygen Therapy: Patient Spontanous Breathing and Patient connected to face mask oxygen  Post-op Assessment: Report given to RN and Post -op Vital signs reviewed and stable  Post vital signs: Reviewed  Last Vitals:  Vitals:   12/19/16 1035 12/19/16 1040  BP: 102/72 103/67  Pulse: 87 94  Resp: 16 15  Temp:      Last Pain:  Vitals:   12/19/16 0925  TempSrc: Oral         Complications: No apparent anesthesia complications

## 2016-12-19 NOTE — H&P (Signed)
History of Present Illness Marland Kitchen T. Keyuana Wank MD; 12/06/2016 3:39 PM) The patient is a 56 year old female who presents with breast cancer. The patient returns following completion of neoadjuvant chemotherapy for locally advanced cancer of the right breast. She finished her chemotherapy about one month ago. She had a lot of difficulty with neutropenia and fever and was able to get through most of but not all of her planned chemotherapy. She is starting to feel better but still fatigued. She is no longer able to feel the mass in the upper outer right breast.  Her original presentation was as follows:  She is a post menopausal female referred by Dr. Nolon Nations for evaluation of recently diagnosed carcinoma of the right breast. She recently was able to feel a lump in her upper outer right breast near the axilla. she has a history of fibrocystic disease and lumpy breasts but all previously benign so she initially did not worry about it. However it persisted and she saw her primary physician and was referred for workup at the breast center. iinitial mammogram showed no apparent change and multiple oval circumscribed masses in both breasts with history of multiple cysts. However there was a new lobulated mass with somewhat ill-defined margins in the upper outer right breast posteriorly corresponding to her palpable abnormality and also multiple enlarged lymph nodes. targeted ultrasound showed a 2.8 cm irregular mass in the 10:00 position right breast 12 cm from the nipple as well as multiple abnormal appearing axillary lymph nodes measuring up to 2.2 cm. 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 She is seen now in multidisciplinary clinic for initial treatment planning. She has experienced a breast lump as  above but no other symptoms such aspain or skin changes or nipple discharge.   Findings at that time were the following: Tumor size: 2.8 cm Tumor grade: 3, Ki-67 25% Estrogen Receptor: +95% Progesterone Receptor: +90% Her-2 neu: negative Lymph node status: positive  MRI was performed on December 21. The area of enhancement in the right breast previously measured 4.3 cm and is now 2.4 cm. There has been normalization of the lymph nodes previously seen on MRI. Of note is her tumor was fairly high in the tail of Spence and not far from the axilla.    Problem List/Past Medical Marland Kitchen T. Vaishnav Demartin, MD; 12/06/2016 3:39 PM) CANCER OF RIGHT BREAST, STAGE 3 (C50.911)   Past Surgical History Marland Kitchen T. Donnell Wion, MD; 12/06/2016 3:39 PM) Breast Biopsy  Right. Oral Surgery   Diagnostic Studies History Marland Kitchen T. Mikyle Sox, MD; 12/06/2016 3:39 PM) Colonoscopy  5-10 years ago Mammogram  within last year Pap Smear  1-5 years ago  Allergies Patsey Berthold, [place]; 12/06/2016 2:09 PM) Gabapentin *CHEMICALS*  Hydrocodone-Acetaminophen *ANALGESICS - OPIOID*  TraMADol HCl *ANALGESICS - OPIOID*   Medication History Patsey Berthold, CMA; 12/06/2016 2:11 PM) Ibuprofen ('800MG'$  Tablet, Oral) Active. DULoxetine HCl ('60MG'$  Capsule DR Part, Oral) Active. Claritin ('5MG'$  Tablet Chewable, Oral) Active. Mometasone Furoate (50MCG/ACT Suspension, Nasal) Active. Acyclovir ('400MG'$  Tablet, Oral) Active. Ibuprofen ('200MG'$  Capsule, Oral as needed) Active. Omeprazole ('40MG'$  Capsule DR, Oral) Active. Butalbital-APAP-Caff-Cod (Oral as needed) Specific strength unknown - Active. Medications Reconciled  Social History Marland Kitchen T. Rivers Gassmann, MD; 12/06/2016 3:39 PM) Alcohol use  Occasional alcohol use. Caffeine use  Coffee, Tea. No drug use  Tobacco use  Former smoker.  Family History Marland Kitchen T. Bethlehem Langstaff, MD; 12/06/2016 3:39 PM) Arthritis  Mother. Cancer  Father. Cerebrovascular Accident   Sister. Depression  Mother. Heart Disease  Mother. Heart disease in female family member before age 48  Hypertension  Mother. Migraine Headache  Brother. Respiratory Condition  Father. Thyroid problems  Sister.  Pregnancy / Birth History Marland Kitchen T. Breta Demedeiros, MD; 12/06/2016 3:39 PM) Age at menarche  77 years. Age of menopause  66-50 Contraceptive History  Oral contraceptives. Gravida  3 Length (months) of breastfeeding  12-24 Maternal age  79-25 Para  40  Other Problems Marland Kitchen T. Dinia Joynt, MD; 12/06/2016 3:39 PM) Anxiety Disorder  Depression  Gastroesophageal Reflux Disease  Migraine Headache   Vitals Patsey Berthold CMA; 12/06/2016 2:11 PM) 12/06/2016 2:11 PM Weight: 232 lb Height: 64in Body Surface Area: 2.08 m Body Mass Index: 39.82 kg/m  Temp.: 97.3F  Pulse: 101 (Regular)  BP: 138/90 (Sitting, Left Arm, Standard)       Physical Exam Marland Kitchen T. Ruhaan Nordahl MD; 12/06/2016 3:41 PM) The physical exam findings are as follows: Note:General: Alert, overweight Caucasian female, in no distress Skin: Warm and dry without rash or infection. HEENT: No palpable masses or thyromegaly. Sclera nonicteric. Pupils equal round and reactive. Breasts: Large breasts bilaterally. There may be some subtle thickening in the tail of Spence of the right breast but no definite mass. I cannot feel any axillary adenopathy. Lymph nodes: No cervical, supraclavicular, nodes palpable. Lungs: Breath sounds clear and equal. No wheezing or increased work of breathing. Cardiovascular: Regular rate and rhythm without murmer. No JVD or edema. Extremities: No edema or joint swelling or deformity. No chronic venous stasis changes. Neurologic: Alert and fully oriented. Gait normal. No focal weakness. Psychiatric: Normal mood and affect. Thought content appropriate with normal judgement and insight    Assessment & Plan Marland Kitchen T. Ramsey Midgett MD; 12/06/2016 3:45 PM) CANCER OF RIGHT  BREAST, STAGE 3 (C50.911) Impression: 56 year old female with a recent diagnosis of cancer of the right breast, upper outer quadrant. Clinical stage 3, ER positive, PR positive, HER-2 negative. She has completed neoadjuvant chemotherapy. She had quite a lot of side effects and was not able to completely get her treatments in but had a majority of the treatments with what appears to be an excellent response clinically and by MRI as above. I discussed all this with the patient in detail. As per previous plan I discussed proceeding with breast conservation with right breast lumpectomy and with targeted right axillary dissection. We discussed the procedure in detail and alternatives. We discussed that full axillary dissection would be strict guidelines but that if we can significantly sample her lymph nodes without finding multiple positive nodes that I feel this would be an alternative that would reduce the chance of lymphedema with minimal if any cancer risk. She would prefer the limited dissection. As her tumor is very close to the axilla I think she will require wire localization for her tumor and the seed in the axilla. We discussed the nature surgery and risks of bleeding, infection, lymphedema, possible need for further surgery based on final margins and possible decision to proceed with axillary dissection during surgery. All her questions were answered and she agrees to proceed. Current Plans Schedule for Surgery  Wire localized right breast lumpectomy and see localized targeted right axillary dissection/sentinel lymph node biopsy under general anesthesia

## 2016-12-19 NOTE — Anesthesia Postprocedure Evaluation (Signed)
Anesthesia Post Note  Patient: April [name]  Procedure(s) Performed: Procedure(s) (LRB): RIGHT BREAST NEEDLE LOCALIZED LUMPECTOMY, RIGHT RADIOACTIVE SEED TARGETED AXILLARY SENTINEL LYMPH NODE BIOPSY (Right) REMOVAL PORT-A-CATH (Left)  Patient location during evaluation: PACU Anesthesia Type: General Level of consciousness: awake Pain management: pain level controlled Vital Signs Assessment: post-procedure vital signs reviewed and stable Respiratory status: spontaneous breathing Cardiovascular status: stable Anesthetic complications: no       Last Vitals:  Vitals:   12/19/16 1424 12/19/16 1451  BP:  139/88  Pulse:  94  Resp: 16 12  Temp:  36.6 C    Last Pain:  Vitals:   12/19/16 1451  TempSrc: Oral  PainSc: 5                  [name]

## 2016-12-20 ENCOUNTER — Encounter (HOSPITAL_BASED_OUTPATIENT_CLINIC_OR_DEPARTMENT_OTHER): Payer: Self-pay | Admitting: General Surgery

## 2016-12-21 ENCOUNTER — Ambulatory Visit: Payer: Self-pay | Admitting: General Surgery

## 2016-12-22 ENCOUNTER — Encounter (HOSPITAL_BASED_OUTPATIENT_CLINIC_OR_DEPARTMENT_OTHER): Payer: Self-pay | Admitting: General Surgery

## 2016-12-25 ENCOUNTER — Encounter (HOSPITAL_BASED_OUTPATIENT_CLINIC_OR_DEPARTMENT_OTHER): Payer: Self-pay | Admitting: *Deleted

## 2016-12-25 NOTE — Pre-Procedure Instructions (Signed)
Boost Breeze 8 oz. given to pt. with instructions to drink by 0600 DOS; voiced understanding.

## 2016-12-26 ENCOUNTER — Encounter (HOSPITAL_BASED_OUTPATIENT_CLINIC_OR_DEPARTMENT_OTHER): Payer: Self-pay | Admitting: Anesthesiology

## 2016-12-26 ENCOUNTER — Ambulatory Visit (HOSPITAL_BASED_OUTPATIENT_CLINIC_OR_DEPARTMENT_OTHER): Payer: Medicaid Other | Admitting: Anesthesiology

## 2016-12-26 ENCOUNTER — Ambulatory Visit (HOSPITAL_BASED_OUTPATIENT_CLINIC_OR_DEPARTMENT_OTHER)
Admission: RE | Admit: 2016-12-26 | Discharge: 2016-12-27 | Disposition: A | Discharge status: Home / Self Care | Payer: Medicaid Other | Source: Ambulatory Visit | Attending: General Surgery | Admitting: General Surgery

## 2016-12-26 ENCOUNTER — Encounter (HOSPITAL_BASED_OUTPATIENT_CLINIC_OR_DEPARTMENT_OTHER)
Admission: RE | Disposition: A | Discharge status: Home / Self Care | Payer: Self-pay | Source: Ambulatory Visit | Attending: General Surgery

## 2016-12-26 DIAGNOSIS — Z170 Estrogen receptor positive status [ER+]: Secondary | ICD-10-CM | POA: Insufficient documentation

## 2016-12-26 DIAGNOSIS — C773 Secondary and unspecified malignant neoplasm of axilla and upper limb lymph nodes: Secondary | ICD-10-CM | POA: Insufficient documentation

## 2016-12-26 DIAGNOSIS — Z9221 Personal history of antineoplastic chemotherapy: Secondary | ICD-10-CM | POA: Insufficient documentation

## 2016-12-26 DIAGNOSIS — G43909 Migraine, unspecified, not intractable, without status migrainosus: Secondary | ICD-10-CM | POA: Insufficient documentation

## 2016-12-26 DIAGNOSIS — Z79899 Other long term (current) drug therapy: Secondary | ICD-10-CM | POA: Diagnosis not present

## 2016-12-26 DIAGNOSIS — K219 Gastro-esophageal reflux disease without esophagitis: Secondary | ICD-10-CM | POA: Diagnosis not present

## 2016-12-26 DIAGNOSIS — C50411 Malignant neoplasm of upper-outer quadrant of right female breast: Secondary | ICD-10-CM | POA: Diagnosis not present

## 2016-12-26 DIAGNOSIS — Z87891 Personal history of nicotine dependence: Secondary | ICD-10-CM | POA: Diagnosis not present

## 2016-12-26 DIAGNOSIS — C50912 Malignant neoplasm of unspecified site of left female breast: Secondary | ICD-10-CM | POA: Diagnosis present

## 2016-12-26 DIAGNOSIS — F329 Major depressive disorder, single episode, unspecified: Secondary | ICD-10-CM | POA: Insufficient documentation

## 2016-12-26 DIAGNOSIS — C50911 Malignant neoplasm of unspecified site of right female breast: Secondary | ICD-10-CM | POA: Diagnosis present

## 2016-12-26 HISTORY — PX: AXILLARY LYMPH NODE DISSECTION: SHX5229

## 2016-12-26 SURGERY — LYMPHADENECTOMY, AXILLARY
Anesthesia: General | Site: Axilla | Laterality: Right

## 2016-12-26 MED ORDER — HYDROMORPHONE HCL 1 MG/ML IJ SOLN
0.2500 mg | INTRAMUSCULAR | Status: DC | PRN
  Administered 2016-12-26 (×4): 0.5 mg via INTRAVENOUS

## 2016-12-26 MED ORDER — DULOXETINE HCL 30 MG PO CPEP: 30.0000 mg | ORAL_CAPSULE | ORAL | Status: DC

## 2016-12-26 MED ORDER — CHLORHEXIDINE GLUCONATE CLOTH 2 % EX PADS: 6.0000 | MEDICATED_PAD | Freq: Once | CUTANEOUS | Status: DC

## 2016-12-26 MED ORDER — OXYCODONE HCL 5 MG PO TABS
5.0000 mg | ORAL_TABLET | ORAL | Status: DC | PRN
  Administered 2016-12-27 (×2): 10 mg via ORAL
  Filled 2016-12-26 (×2): qty 2

## 2016-12-26 MED ORDER — ONDANSETRON HCL 4 MG/2ML IJ SOLN
INTRAMUSCULAR | Status: DC | PRN
  Administered 2016-12-26: 4 mg via INTRAVENOUS

## 2016-12-26 MED ORDER — MIDAZOLAM HCL 2 MG/2ML IJ SOLN
1.0000 mg | INTRAMUSCULAR | Status: DC | PRN
  Administered 2016-12-26: 2 mg via INTRAVENOUS
  Administered 2016-12-26: 1 mg via INTRAVENOUS

## 2016-12-26 MED ORDER — OXYCODONE HCL 5 MG/5ML PO SOLN: 5.0000 mg | Freq: Once | ORAL | Status: DC | PRN

## 2016-12-26 MED ORDER — OXYCODONE HCL 5 MG PO TABS: 5.0000 mg | ORAL_TABLET | Freq: Four times a day (QID) | ORAL | 0 refills | Status: DC | PRN

## 2016-12-26 MED ORDER — CEFAZOLIN SODIUM-DEXTROSE 2-4 GM/100ML-% IV SOLN
INTRAVENOUS | Status: AC
  Filled 2016-12-26: qty 100

## 2016-12-26 MED ORDER — LIDOCAINE 2% (20 MG/ML) 5 ML SYRINGE
INTRAMUSCULAR | Status: DC | PRN
  Administered 2016-12-26: 80 mg via INTRAVENOUS

## 2016-12-26 MED ORDER — ONDANSETRON HCL 4 MG/2ML IJ SOLN: 4.0000 mg | Freq: Four times a day (QID) | INTRAMUSCULAR | Status: DC | PRN

## 2016-12-26 MED ORDER — MIDAZOLAM HCL 2 MG/2ML IJ SOLN
INTRAMUSCULAR | Status: AC
  Filled 2016-12-26: qty 2

## 2016-12-26 MED ORDER — MORPHINE SULFATE (PF) 4 MG/ML IV SOLN
INTRAVENOUS | Status: AC
  Filled 2016-12-26: qty 1

## 2016-12-26 MED ORDER — CELECOXIB 200 MG PO CAPS
ORAL_CAPSULE | ORAL | Status: AC
  Filled 2016-12-26: qty 2

## 2016-12-26 MED ORDER — FENTANYL CITRATE (PF) 100 MCG/2ML IJ SOLN
INTRAMUSCULAR | Status: AC
  Filled 2016-12-26: qty 2

## 2016-12-26 MED ORDER — LACTATED RINGERS IV SOLN
INTRAVENOUS | Status: DC
  Administered 2016-12-26 (×2): via INTRAVENOUS

## 2016-12-26 MED ORDER — BUPIVACAINE-EPINEPHRINE (PF) 0.5% -1:200000 IJ SOLN
INTRAMUSCULAR | Status: DC | PRN
  Administered 2016-12-26: 30 mL

## 2016-12-26 MED ORDER — FENTANYL CITRATE (PF) 100 MCG/2ML IJ SOLN
50.0000 ug | INTRAMUSCULAR | Status: AC | PRN
  Administered 2016-12-26: 100 ug via INTRAVENOUS
  Administered 2016-12-26 (×2): 25 ug via INTRAVENOUS
  Administered 2016-12-26: 50 ug via INTRAVENOUS
  Administered 2016-12-26 (×2): 25 ug via INTRAVENOUS

## 2016-12-26 MED ORDER — ACETAMINOPHEN 500 MG PO TABS
ORAL_TABLET | ORAL | Status: AC
  Filled 2016-12-26: qty 2

## 2016-12-26 MED ORDER — DEXAMETHASONE SODIUM PHOSPHATE 4 MG/ML IJ SOLN
INTRAMUSCULAR | Status: DC | PRN
  Administered 2016-12-26: 10 mg via INTRAVENOUS

## 2016-12-26 MED ORDER — MORPHINE SULFATE (PF) 2 MG/ML IV SOLN
2.0000 mg | INTRAVENOUS | Status: DC | PRN
  Administered 2016-12-26 (×2): 2 mg via INTRAVENOUS

## 2016-12-26 MED ORDER — LACTATED RINGERS IV SOLN
INTRAVENOUS | Status: DC
  Administered 2016-12-26: 09:00:00 via INTRAVENOUS

## 2016-12-26 MED ORDER — OXYCODONE HCL 5 MG PO TABS
5.0000 mg | ORAL_TABLET | Freq: Four times a day (QID) | ORAL | Status: DC | PRN
  Administered 2016-12-26 (×2): 10 mg via ORAL
  Filled 2016-12-26 (×2): qty 2

## 2016-12-26 MED ORDER — PROPOFOL 10 MG/ML IV BOLUS
INTRAVENOUS | Status: DC | PRN
  Administered 2016-12-26: 170 mg via INTRAVENOUS

## 2016-12-26 MED ORDER — BUPIVACAINE-EPINEPHRINE 0.5% -1:200000 IJ SOLN
INTRAMUSCULAR | Status: DC | PRN
  Administered 2016-12-26: 20 mL

## 2016-12-26 MED ORDER — PROMETHAZINE HCL 25 MG/ML IJ SOLN: 6.2500 mg | INTRAMUSCULAR | Status: DC | PRN

## 2016-12-26 MED ORDER — SCOPOLAMINE 1 MG/3DAYS TD PT72: 1.0000 | MEDICATED_PATCH | Freq: Once | TRANSDERMAL | Status: DC | PRN

## 2016-12-26 MED ORDER — LACTATED RINGERS IV SOLN: INTRAVENOUS | Status: DC

## 2016-12-26 MED ORDER — ONDANSETRON 4 MG PO TBDP: 4.0000 mg | ORAL_TABLET | Freq: Four times a day (QID) | ORAL | Status: DC | PRN

## 2016-12-26 MED ORDER — PANTOPRAZOLE SODIUM 40 MG PO TBEC: 40.0000 mg | DELAYED_RELEASE_TABLET | Freq: Every day | ORAL | Status: DC

## 2016-12-26 MED ORDER — DEXAMETHASONE SODIUM PHOSPHATE 10 MG/ML IJ SOLN
INTRAMUSCULAR | Status: AC
  Filled 2016-12-26: qty 1

## 2016-12-26 MED ORDER — ENOXAPARIN SODIUM 40 MG/0.4ML ~~LOC~~ SOLN
40.0000 mg | SUBCUTANEOUS | Status: DC
  Filled 2016-12-26: qty 0.4

## 2016-12-26 MED ORDER — PROPOFOL 10 MG/ML IV BOLUS
INTRAVENOUS | Status: AC
  Filled 2016-12-26: qty 20

## 2016-12-26 MED ORDER — ACETAMINOPHEN 500 MG PO TABS
1000.0000 mg | ORAL_TABLET | ORAL | Status: AC
  Administered 2016-12-26: 1000 mg via ORAL

## 2016-12-26 MED ORDER — LIDOCAINE 2% (20 MG/ML) 5 ML SYRINGE
INTRAMUSCULAR | Status: AC
  Filled 2016-12-26: qty 5

## 2016-12-26 MED ORDER — HYDROMORPHONE HCL 1 MG/ML IJ SOLN
INTRAMUSCULAR | Status: AC
  Filled 2016-12-26: qty 1

## 2016-12-26 MED ORDER — ONDANSETRON HCL 4 MG/2ML IJ SOLN
INTRAMUSCULAR | Status: AC
  Filled 2016-12-26: qty 2

## 2016-12-26 MED ORDER — CEFAZOLIN SODIUM-DEXTROSE 2-4 GM/100ML-% IV SOLN
2.0000 g | INTRAVENOUS | Status: AC
  Administered 2016-12-26: 2 g via INTRAVENOUS

## 2016-12-26 MED ORDER — MEPERIDINE HCL 25 MG/ML IJ SOLN: 6.2500 mg | INTRAMUSCULAR | Status: DC | PRN

## 2016-12-26 MED ORDER — CELECOXIB 400 MG PO CAPS
400.0000 mg | ORAL_CAPSULE | ORAL | Status: AC
  Administered 2016-12-26: 400 mg via ORAL

## 2016-12-26 MED ORDER — IBUPROFEN 600 MG PO TABS
600.0000 mg | ORAL_TABLET | Freq: Three times a day (TID) | ORAL | Status: DC | PRN
  Administered 2016-12-26 – 2016-12-27 (×3): 600 mg via ORAL
  Filled 2016-12-26 (×3): qty 1

## 2016-12-26 MED ORDER — DULOXETINE HCL 60 MG PO CPEP: 60.0000 mg | ORAL_CAPSULE | Freq: Every day | ORAL | Status: DC

## 2016-12-26 MED ORDER — OXYCODONE HCL 5 MG PO TABS: 5.0000 mg | ORAL_TABLET | Freq: Once | ORAL | Status: DC | PRN

## 2016-12-26 SURGICAL SUPPLY — 47 items
APPLIER CLIP 9.375 MED OPEN (MISCELLANEOUS) ×4
APR CLP MED 9.3 20 MLT OPN (MISCELLANEOUS) ×2
BLADE SURG 15 STRL LF DISP TIS (BLADE) ×1 IMPLANT
BLADE SURG 15 STRL SS (BLADE) ×2
CANISTER SUCT 1200ML W/VALVE (MISCELLANEOUS) ×2 IMPLANT
CHLORAPREP W/TINT 26ML (MISCELLANEOUS) ×2 IMPLANT
CLIP APPLIE 9.375 MED OPEN (MISCELLANEOUS) ×1 IMPLANT
COVER BACK TABLE 60X90IN (DRAPES) ×2 IMPLANT
COVER MAYO STAND STRL (DRAPES) ×2 IMPLANT
DECANTER SPIKE VIAL GLASS SM (MISCELLANEOUS) ×1 IMPLANT
DRAIN CHANNEL 19F RND (DRAIN) ×2 IMPLANT
DRAIN HEMOVAC 1/8 X 5 (WOUND CARE) IMPLANT
DRAPE LAPAROSCOPIC ABDOMINAL (DRAPES) ×2 IMPLANT
DRAPE UTILITY XL STRL (DRAPES) ×2 IMPLANT
ELECT COATED BLADE 2.86 ST (ELECTRODE) ×2 IMPLANT
ELECT REM PT RETURN 9FT ADLT (ELECTROSURGICAL) ×2
ELECTRODE REM PT RTRN 9FT ADLT (ELECTROSURGICAL) ×1 IMPLANT
EVACUATOR SILICONE 100CC (DRAIN) ×2 IMPLANT
GAUZE SPONGE 4X4 12PLY STRL (GAUZE/BANDAGES/DRESSINGS) ×1 IMPLANT
GLOVE BIOGEL PI IND STRL 7.0 (GLOVE) IMPLANT
GLOVE BIOGEL PI IND STRL 8 (GLOVE) ×1 IMPLANT
GLOVE BIOGEL PI INDICATOR 7.0 (GLOVE) ×2
GLOVE BIOGEL PI INDICATOR 8 (GLOVE) ×1
GLOVE ECLIPSE 7.5 STRL STRAW (GLOVE) ×2 IMPLANT
GLOVE SURG SYN 7.5  E (GLOVE) ×1
GLOVE SURG SYN 7.5 E (GLOVE) ×1 IMPLANT
GLOVE SURG SYN 7.5 PF PI (GLOVE) IMPLANT
GOWN STRL REUS W/ TWL LRG LVL3 (GOWN DISPOSABLE) ×1 IMPLANT
GOWN STRL REUS W/ TWL XL LVL3 (GOWN DISPOSABLE) ×1 IMPLANT
GOWN STRL REUS W/TWL LRG LVL3 (GOWN DISPOSABLE) ×2
GOWN STRL REUS W/TWL XL LVL3 (GOWN DISPOSABLE) ×2
NDL HYPO 25X1 1.5 SAFETY (NEEDLE) ×1 IMPLANT
NEEDLE HYPO 25X1 1.5 SAFETY (NEEDLE) ×2 IMPLANT
PACK BASIN DAY SURGERY FS (CUSTOM PROCEDURE TRAY) ×2 IMPLANT
PENCIL BUTTON HOLSTER BLD 10FT (ELECTRODE) ×2 IMPLANT
PIN SAFETY STERILE (MISCELLANEOUS) ×1 IMPLANT
SLEEVE SCD COMPRESS KNEE MED (MISCELLANEOUS) ×2 IMPLANT
SPONGE LAP 4X18 X RAY DECT (DISPOSABLE) ×2 IMPLANT
STAPLER VISISTAT 35W (STAPLE) ×2 IMPLANT
SUT ETHILON 3 0 PS 1 (SUTURE) ×2 IMPLANT
SUT VICRYL 3-0 CR8 SH (SUTURE) ×2 IMPLANT
SYR BULB 3OZ (MISCELLANEOUS) IMPLANT
SYR CONTROL 10ML LL (SYRINGE) ×2 IMPLANT
TOWEL OR 17X24 6PK STRL BLUE (TOWEL DISPOSABLE) ×3 IMPLANT
TOWEL OR NON WOVEN STRL DISP B (DISPOSABLE) ×2 IMPLANT
TUBE CONNECTING 20X1/4 (TUBING) ×2 IMPLANT
YANKAUER SUCT BULB TIP NO VENT (SUCTIONS) ×2 IMPLANT

## 2016-12-26 NOTE — Anesthesia Preprocedure Evaluation (Addendum)
Anesthesia Evaluation  Patient identified by MRN, date of birth, ID band Patient awake    Reviewed: Allergy & Precautions, NPO status , Patient's Chart, lab work & pertinent test results  Airway Mallampati: I  TM Distance: >3 FB Neck ROM: Full    Dental  (+) Teeth Intact, Dental Advisory Given   Pulmonary former smoker,    breath sounds clear to auscultation       Cardiovascular negative cardio ROS   Rhythm:Regular Rate:Normal     Neuro/Psych  Headaches, PSYCHIATRIC DISORDERS Anxiety Depression  Neuromuscular disease    GI/Hepatic Neg liver ROS, GERD  Medicated,  Endo/Other  negative endocrine ROS  Renal/GU negative Renal ROS  negative genitourinary   Musculoskeletal  (+) Arthritis , Osteoarthritis,  Fibromyalgia -  Abdominal (+) + obese,  Abdomen: soft.    Peds negative pediatric ROS (+)  Hematology negative hematology ROS (+)   Anesthesia Other Findings   Reproductive/Obstetrics negative OB ROS                           Anesthesia Physical Anesthesia Plan  ASA: II  Anesthesia Plan: General   Post-op Pain Management:    Induction: Intravenous  Airway Management Planned: LMA  Additional Equipment:   Intra-op Plan:   Post-operative Plan: Extubation in OR  Informed Consent: I have reviewed the patients History and Physical, chart, labs and discussed the procedure including the risks, benefits and alternatives for the proposed anesthesia with the patient or authorized representative who has indicated his/her understanding and acceptance.   Dental advisory given  Plan Discussed with: CRNA  Anesthesia Plan Comments:         Anesthesia Quick Evaluation

## 2016-12-26 NOTE — Transfer of Care (Signed)
Immediate Anesthesia Transfer of Care Note  Patient: April [name]  Procedure(s) Performed: Procedure(s): RIGHT AXILLARY LYMPH NODE DISSECTION (Right)  Patient Location: PACU  Anesthesia Type:GA combined with regional for post-op pain  Level of Consciousness: awake, sedated and patient cooperative  Airway & Oxygen Therapy: Patient Spontanous Breathing and Patient connected to face mask oxygen  Post-op Assessment: Report given to RN and Post -op Vital signs reviewed and stable  Post vital signs: Reviewed and stable  Last Vitals:  Vitals:   12/26/16 0924 12/26/16 0930  BP:  107/74  Pulse: 86 86  Resp: 17 16  Temp:      Last Pain:  Vitals:   12/26/16 0820  TempSrc: Oral  PainSc: 2          Complications: No apparent anesthesia complications

## 2016-12-26 NOTE — Interval H&P Note (Signed)
History and Physical Interval Note:  12/26/2016 10:05 AM  April [name]  has presented today for surgery, with the diagnosis of RIGHT BREAST CANCER WITH METASTISIS TO LYMPH NODES  The various methods of treatment have been discussed with the patient and family. After consideration of risks, benefits and other options for treatment, the patient has consented to  Procedure(s): RIGHT AXILLARY LYMPH NODE DISSECTION (Right) as a surgical intervention .  The patient's history has been reviewed, patient examined, no change in status, stable for surgery.  I have reviewed the patient's chart and labs.  Questions were answered to the patient's satisfaction.     Bryttney Netzer T

## 2016-12-26 NOTE — Progress Notes (Signed)
Assisted Dr. Smith [name] with right, ultrasound guided, axillary block. Side rails up, monitors on throughout procedure. See vital signs in flow sheet. Tolerated Procedure well.

## 2016-12-26 NOTE — Progress Notes (Signed)
Addendum to previous note.  Block per Dr. Smith [name] was Pectoralis not Axillary.

## 2016-12-26 NOTE — Op Note (Signed)
Preoperative Diagnosis: Stage III  RIGHT BREAST CANCER   Postoprative Diagnosis: Same  Procedure: Procedure(s): RIGHT AXILLARY LYMPH NODE DISSECTION   Surgeon: Excell Seltzer T   Assistants: None  Anesthesia:  General LMA anesthesia  Indications: Patient is a 56 year old female with a diagnosis of stage III cancer of the right breast, status post neoadjuvant chemotherapy. Several days ago she underwent right breast lumpectomy and radioactive seed targeted right axillary lymph node biopsy. Lymph nodes return 3 out of 3 positive with extracapsular extension. We have recommended proceeding with complete axillary dissection and discussed the indications and nature of the surgery and risks with the patient detailed elsewhere and she agrees to proceed.    Procedure Detail:  Patient was brought to the operating room, placed in the supine position on the operating table and laryngeal mask general anesthesia induced. She received broad-spectrum preoperative IV antibiotics. PAS were in place. She had a previous single incision in the low axilla/tail of Spence area. I had left a JP drain which was removed. The entire right breast and axilla and upper arm were widely sterilely prepped and draped. Patient timeout was performed and correct procedure verified. The previous incision was sharply opened. Dissection was carried down through the subcutaneous tissue. The lumpectomy cavity was widely opened which had extended down onto the anterolateral surface of the axillary contents. The pectoralis major had been partly dissected and this was completely mobilized. Attachments of the axillary contents to the tail of Spence were taken down and the dissection deepened down to the chest wall. Posteriorly the dissection was deepened to the anterior border of the latissimus which was dissected along its length. This point the pectoralis major was retracted medially. The clavipectoral fascia was incised and the  pectoralis minor retracted medially. Beginning beneath the pectoralis minor all fibrofatty tissue inferior to the axillary vein was dissected inferiorly and laterally. Perforating branches of the axillary artery and vein were divided between clips. As the dissection progressed laterally the long thoracic nerve was identified and carefully protected. The thoracodorsal nerve and vessels were identified laterally and protected. All fibrofatty tissue between these 2 structures inferior to the axillary vein was swept inferiorly. The second intercostal nerve was divided. Further attachments along the subscapularis were taken down mostly with blunt dissection and the specimen dissected away from the chest wall near the long thoracic and from the thoracodorsal nerve and vessels carefully protecting the structures and the specimen removed. The wound was thoroughly irrigated and complete hemostasis assured. Through a new stab wound inferior to the incision a 19 Blake drain was left in the axilla. The deep subcutaneous tissue was closed with interrupted 3-0 Vicryl and the skin closed with staples. Sponge needle and instrument counts were correct.    Findings: As above  Estimated Blood Loss:  less than 50 mL         Drains: 19 Blake drain in right axilla  Blood Given: none          Specimens: Right axillary contents        Complications:  * No complications entered in OR log *         Disposition: PACU - hemodynamically stable.         Condition: stable

## 2016-12-26 NOTE — Anesthesia Procedure Notes (Signed)
Procedure Name: LMA Insertion Date/Time: 12/26/2016 10:24 AM Performed by: Lyndee Leo Pre-anesthesia Checklist: Patient identified, Emergency Drugs available, Suction available and Patient being monitored Patient Re-evaluated:Patient Re-evaluated prior to inductionOxygen Delivery Method: Circle system utilized Preoxygenation: Pre-oxygenation with 100% oxygen Intubation Type: IV induction Ventilation: Mask ventilation without difficulty LMA: LMA inserted LMA Size: 4.0 Number of attempts: 1 Airway Equipment and Method: Bite block Placement Confirmation: positive ETCO2 Tube secured with: Tape Dental Injury: Teeth and Oropharynx as per pre-operative assessment

## 2016-12-26 NOTE — H&P (Signed)
History of Present Illness Marland Kitchen T. Joline Encalada MD; 12/06/2016 3:39 PM) The patient is a 56 year old female who presents with breast cancer. She completed  neoadjuvant chemotherapy for locally advanced cancer of the right breast. She finished her chemotherapy about one month ago. She had a lot of difficulty with neutropenia and fever and was able to get through most of but not all of her planned chemotherapy. Asked week she underwent radioactive seed localized targeted right axillary lymph node biopsy and right breast lumpectomy. 3 lymph nodes were removed and final pathology has revealed metastatic carcinoma in all 3 lymph nodes. With this finding I have recommended proceeding with complete axillary dissection and she presents for this procedure.  Her original presentation was as follows:  She is a post menopausal female referred by Dr. Nolon Nations for evaluation of recently diagnosed carcinoma of the right breast. She recently was able to feel a lump in her upper outer right breast near the axilla. she has a history of fibrocystic disease and lumpy breasts but all previously benign so she initially did not worry about it. However it persisted and she saw her primary physician and was referred for workup at the breast center. iinitial mammogram showed no apparent change and multiple oval circumscribed masses in both breasts with history of multiple cysts. However there was a new lobulated mass with somewhat ill-defined margins in the upper outer right breast posteriorly corresponding to her palpable abnormality and also multiple enlarged lymph nodes. targeted ultrasound showed a 2.8 cm irregular mass in the 10:00 position right breast 12 cm from the nipple as well as multiple abnormal appearing axillary lymph nodes measuring up to 2.2 cm. An ultrasound guided breast biopsy was performed on 007/19/2017 of both the breast mass and the largest axillary lymph node with pathology revealing invasive  ductal carcinoma of the breast and metastatic breast cancer in her lymph node. She is seen now in multidisciplinary clinic for initial treatment planning. She has experienced a breast lump as above but no other symptoms such aspain or skin changes or nipple discharge.   Findings at that time were the following: Tumor size: 2.8 cm Tumor grade: 3, Ki-67 25% Estrogen Receptor: +95% Progesterone Receptor: +90% Her-2 neu: negative Lymph node status: positive  MRI was performed on December 21. The area of enhancement in the right breast previously measured 4.3 cm and is now 2.4 cm. There has been normalization of the lymph nodes previously seen on MRI. Of note is her tumor was fairly high in the tail of Spence and not far from the axilla.    Problem List/Past Medical Marland Kitchen T. Mitsue Peery, MD; 12/06/2016 3:39 PM) CANCER OF RIGHT BREAST, STAGE 3 (C50.911)   Past Surgical History Marland Kitchen T. Rynell Ciotti, MD; 12/06/2016 3:39 PM) Breast Biopsy  Right. Oral Surgery   Diagnostic Studies History Marland Kitchen T. Obi Scrima, MD; 12/06/2016 3:39 PM) Colonoscopy  5-10 years ago Mammogram  within last year Pap Smear  1-5 years ago  Allergies Patsey Berthold, [place]; 12/06/2016 2:09 PM) Gabapentin *CHEMICALS*  Hydrocodone-Acetaminophen *ANALGESICS - OPIOID*  TraMADol HCl *ANALGESICS - OPIOID*   Medication History Patsey Berthold, CMA; 12/06/2016 2:11 PM) Ibuprofen ('800MG'$  Tablet, Oral) Active. DULoxetine HCl ('60MG'$  Capsule DR Part, Oral) Active. Claritin ('5MG'$  Tablet Chewable, Oral) Active. Mometasone Furoate (50MCG/ACT Suspension, Nasal) Active. Acyclovir ('400MG'$  Tablet, Oral) Active. Ibuprofen ('200MG'$  Capsule, Oral as needed) Active. Omeprazole ('40MG'$  Capsule DR, Oral) Active. Butalbital-APAP-Caff-Cod (Oral as needed) Specific strength unknown - Active. Medications Reconciled  Social History Marland Kitchen T. Quade Ramirez,  MD; 12/06/2016 3:39 PM) Alcohol use  Occasional alcohol use. Caffeine  use  Coffee, Tea. No drug use  Tobacco use  Former smoker.  Family History Marland Kitchen T. Nikolas Casher, MD; 12/06/2016 3:39 PM) Arthritis  Mother. Cancer  Father. Cerebrovascular Accident  Sister. Depression  Mother. Heart Disease  Mother. Heart disease in female family member before age 55  Hypertension  Mother. Migraine Headache  Brother. Respiratory Condition  Father. Thyroid problems  Sister.  Pregnancy / Birth History Marland Kitchen T. Moe Brier, MD; 12/06/2016 3:39 PM) Age at menarche  84 years. Age of menopause  7-50 Contraceptive History  Oral contraceptives. Gravida  3 Length (months) of breastfeeding  12-24 Maternal age  14-25 Para  66  Other Problems Marland Kitchen T. Cheyann Blecha, MD; 12/06/2016 3:39 PM) Anxiety Disorder  Depression  Gastroesophageal Reflux Disease  Migraine Headache   Vitals Patsey Berthold CMA; 12/06/2016 2:11 PM) 12/06/2016 2:11 PM Weight: 232 lb Height: 64in Body Surface Area: 2.08 m Body Mass Index: 39.82 kg/m  Temp.: 97.16F  Pulse: 101 (Regular)  BP: 138/90 (Sitting, Left Arm, Standard)       Physical Exam Marland Kitchen T. Akeela Busk MD; 12/06/2016 3:41 PM) The physical exam findings are as follows: Note:General: Alert, overweight Caucasian female, in no distress Skin: Warm and dry without rash or infection. HEENT: No palpable masses or thyromegaly. Sclera nonicteric. Pupils equal round and reactive. Breasts: Healing single incision right axilla/tail of Spence area right breast. No erythema or drainage or hematoma. JP drain in place and site appears clean. Lungs: Breath sounds clear and equal. No wheezing or increased work of breathing. Cardiovascular: Regular rate and rhythm without murmer. No JVD or edema. Extremities: No edema or joint swelling or deformity. No chronic venous stasis changes. Neurologic: Alert and fully oriented. Gait normal. No focal weakness. Psychiatric: Normal mood and affect. Thought content  appropriate with normal judgement and insight    Assessment & Plan Marland Kitchen T. Jamala Kohen MD; 12/06/2016 3:45 PM) CANCER OF RIGHT BREAST, STAGE 3 (C50.911) Impression: 56 year old female with a recent diagnosis of cancer of the right breast, upper outer quadrant. Clinical stage 3, ER positive, PR positive, HER-2 negative. She has completed neoadjuvant chemotherapy. She had quite a lot of side effects and was not able to completely get her treatments in but had a majority of the treatments. Subsequent right breast lumpectomy and target axillary lymph node dissection revealed negative margins around her cancer of the talus Spence but 3 positive lymph nodes with extracapsular extension. We've recommended proceeding with completion axillary dissection. I discussed the procedure with the patient including the indications and nature of the surgery, expected recovery, risks of bleeding, infection, nerve or vascular injury and risks of lymphedema. She understands and agrees to proceed.  Plan right axillary dissection with overnight hospitalization under general anesthesia.

## 2016-12-26 NOTE — Discharge Instructions (Signed)
[facility] °Office Phone Number [phone] ° °BREAST BIOPSY/ PARTIAL MASTECTOMY: POST OP INSTRUCTIONS ° °Always review your discharge instruction sheet given to you by the facility where your surgery was performed. ° °IF YOU HAVE DISABILITY OR FAMILY LEAVE FORMS, YOU MUST BRING THEM TO THE OFFICE FOR PROCESSING.  DO NOT GIVE THEM TO YOUR DOCTOR. ° °1. A prescription for pain medication may be given to you upon discharge.  Take your pain medication as prescribed, if needed.  If narcotic pain medicine is not needed, then you may take acetaminophen (Tylenol) or ibuprofen (Advil) as needed. °2. Take your usually prescribed medications unless otherwise directed °3. If you need a refill on your pain medication, please contact your pharmacy.  They will contact our office to request authorization.  Prescriptions will not be filled after 5pm or on week-ends. °4. You should eat very light the first 24 hours after surgery, such as soup, crackers, pudding, etc.  Resume your normal diet the day after surgery. °5. Most patients will experience some swelling and bruising in the breast.  Ice packs and a good support bra will help.  Swelling and bruising can take several days to resolve.  °6. It is common to experience some constipation if taking pain medication after surgery.  Increasing fluid intake and taking a stool softener will usually help or prevent this problem from occurring.  A mild laxative (Milk of Magnesia or Miralax) should be taken according to package directions if there are no bowel movements after 48 hours. °7. Unless discharge instructions indicate otherwise, you may remove your bandages 24-48 hours after surgery, and you may shower at that time.  You may have steri-strips (small skin tapes) in place directly over the incision.  These strips should be left on the skin for 7-10 days.  If your surgeon used skin glue on the incision, you may shower in 24 hours.  The glue will flake off over the  next 2-3 weeks.  Any sutures or staples will be removed at the office during your follow-up visit. °8. ACTIVITIES:  You may resume regular daily activities (gradually increasing) beginning the next day.  Wearing a good support bra or sports bra minimizes pain and swelling.  You may have sexual intercourse when it is comfortable. °a. You may drive when you no longer are taking prescription pain medication, you can comfortably wear a seatbelt, and you can safely maneuver your car and apply brakes. °b. RETURN TO WORK:  ______________________________________________________________________________________ °9. You should see your doctor in the office for a follow-up appointment approximately two weeks after your surgery.  Your doctor’s nurse will typically make your follow-up appointment when she calls you with your pathology report.  Expect your pathology report 2-3 business days after your surgery.  You may call to check if you do not hear from us after three days. °10. OTHER INSTRUCTIONS: _______________________________________________________________________________________________ _____________________________________________________________________________________________________________________________________ °_____________________________________________________________________________________________________________________________________ °_____________________________________________________________________________________________________________________________________ ° °WHEN TO CALL YOUR DOCTOR: °1. Fever over 101.0 °2. Nausea and/or vomiting. °3. Extreme swelling or bruising. °4. Continued bleeding from incision. °5. Increased pain, redness, or drainage from the incision. ° °The clinic staff is available to answer your questions during regular business hours.  Please don’t hesitate to call and ask to speak to one of the nurses for clinical concerns.  If you have a medical emergency, go to the nearest  emergency room or call 911.  A surgeon from [facility] is always on call at the hospital. ° °For further questions, please visit [email or web address]  ° ° ° °  Post Anesthesia Home Care Instructions ° °Activity: °Get plenty of rest for the remainder of the day. A responsible adult should stay with you for 24 hours following the procedure.  °For the next 24 hours, DO NOT: °-Drive a car °-Operate machinery °-Drink alcoholic beverages °-Take any medication unless instructed by your physician °-Make any legal decisions or sign important papers. ° °Meals: °Start with liquid foods such as gelatin or soup. Progress to regular foods as tolerated. Avoid greasy, spicy, heavy foods. If nausea and/or vomiting occur, drink only clear liquids until the nausea and/or vomiting subsides. Call your physician if vomiting continues. ° °Special Instructions/Symptoms: °Your throat may feel dry or sore from the anesthesia or the breathing tube placed in your throat during surgery. If this causes discomfort, gargle with warm salt water. The discomfort should disappear within 24 hours. ° °If you had a scopolamine patch placed behind your ear for the management of post- operative nausea and/or vomiting: ° °1. The medication in the patch is effective for 72 hours, after which it should be removed.  Wrap patch in a tissue and discard in the trash. Wash hands thoroughly with soap and water. °2. You may remove the patch earlier than 72 hours if you experience unpleasant side effects which may include dry mouth, dizziness or visual disturbances. °3. Avoid touching the patch. Wash your hands with soap and water after contact with the patch. °  ° °

## 2016-12-26 NOTE — Anesthesia Procedure Notes (Signed)
Anesthesia Regional Block:  Pectoralis block  Pre-Anesthetic Checklist: ,, timeout performed, Correct Patient, Correct Site, Correct Laterality, Correct Procedure, Correct Position, site marked, Risks and benefits discussed,  Surgical consent,  Pre-op evaluation,  At surgeon's request and post-op pain management  Laterality: Right  Prep: chloraprep       Needles:  Injection technique: Single-shot  Needle Type: Echogenic Needle     Needle Length: 9cm 9 cm Needle Gauge: 21 and 21 G    Additional Needles:  Procedures: ultrasound guided (picture in chart) Pectoralis block Narrative:  Start time: 12/26/2016 9:30 AM End time: 12/26/2016 9:35 AM Injection made incrementally with aspirations every 5 mL.  Performed by: Personally  Anesthesiologist: Suella Broad D  Additional Notes: Pt tolerated well.

## 2016-12-27 ENCOUNTER — Encounter (HOSPITAL_BASED_OUTPATIENT_CLINIC_OR_DEPARTMENT_OTHER): Payer: Self-pay | Admitting: General Surgery

## 2016-12-27 DIAGNOSIS — C773 Secondary and unspecified malignant neoplasm of axilla and upper limb lymph nodes: Secondary | ICD-10-CM | POA: Diagnosis not present

## 2016-12-27 NOTE — Anesthesia Postprocedure Evaluation (Addendum)
Anesthesia Post Note  Patient: April [name]  Procedure(s) Performed: Procedure(s) (LRB): RIGHT AXILLARY LYMPH NODE DISSECTION (Right)  Patient location during evaluation: PACU Anesthesia Type: General Level of consciousness: awake and alert Pain management: pain level controlled Vital Signs Assessment: post-procedure vital signs reviewed and stable Respiratory status: spontaneous breathing, nonlabored ventilation, respiratory function stable and patient connected to nasal cannula oxygen Cardiovascular status: blood pressure returned to baseline and stable Postop Assessment: no signs of nausea or vomiting Anesthetic complications: no        Last Vitals:  Vitals:   12/27/16 0600 12/27/16 0700  BP:  127/82  Pulse: 76 96  Resp: 16 16  Temp:  36.8 C    Last Pain:  Vitals:   12/27/16 0700  TempSrc:   PainSc: 3    Pain Goal:                 Effie Berkshire

## 2016-12-27 NOTE — Discharge Summary (Signed)
  Patient ID: April [name] [id number] y.o. 1961/05/22  12/26/2016  Discharge date and time: 12/27/2016   Admitting Physician: Excell Seltzer T  Discharge Physician: Excell Seltzer T  Admission Diagnoses: RIGHT BREAST CANCER WITH METASTISIS TO LYMPH NODES  Discharge Diagnoses: Same  Operations: Procedure(s): RIGHT AXILLARY LYMPH NODE DISSECTION  Admission Condition: good  Discharged Condition: good  Indication for Admission: The patient is a 56 year old female who presents with breast cancer. She completed  neoadjuvant chemotherapy for locally advanced cancer of the right breast. She finished her chemotherapy about one month ago. She had a lot of difficulty with neutropenia and fever and was able to get through most of but not all of her planned chemotherapy. Asked week she underwent radioactive seed localized targeted right axillary lymph node biopsy and right breast lumpectomy. 3 lymph nodes were removed and final pathology has revealed metastatic carcinoma in all 3 lymph nodes. With this finding I have recommended proceeding with complete axillary dissection and she presents for this procedure.  Hospital Course: Patient underwent an uneventful right axillary dissection. There was no gross evidence of disease. She was observed overnight and there was no evidence of bleeding or other complications. She was relatively comfortable next morning and felt ready for discharge.   Disposition: Home  Patient Instructions:  Allergies as of 12/27/2016      Reactions   Hydrocodone Nausea Only, Other (See Comments)   dizziness   Ultram [tramadol Hcl] Nausea Only   Gabapentin Rash      Medication List    TAKE these medications   acyclovir 400 MG tablet Commonly known as:  ZOVIRAX 1 tab by mouth twice daily   butalbital-acetaminophen-caffeine 50-325-40 MG tablet Commonly known as:  FIORICET, ESGIC TAKE 2 TABLETS BY MOUTH EVERY 6 HOURS AS NEEDED FOR HEADACHE/MIGRAINE    desloratadine 5 MG tablet Commonly known as:  CLARINEX Take 1 tablet (5 mg total) by mouth daily.   DULoxetine 30 MG capsule Commonly known as:  CYMBALTA 1 cap by mouth with 60  mg cap once daily for total of 90 mg daily What changed:  how much to take  how to take this  when to take this  additional instructions   DULoxetine 60 MG capsule Commonly known as:  CYMBALTA Take 1 capsule (60 mg total) by mouth daily. Take along with 30mg  capsule for a total of 90mg  of cymbalta per day. What changed:  Another medication with the same name was changed. Make sure you understand how and when to take each.   ibuprofen 800 MG tablet Commonly known as:  ADVIL,MOTRIN TAKE 1 TABLET BY MOUTH EVERY 8 HOURS AS NEEDED.   mometasone 50 MCG/ACT nasal spray Commonly known as:  NASONEX 2 sprays each nostril daily   omeprazole 40 MG capsule Commonly known as:  PRILOSEC TAKE 1 CAPSULE BY MOUTH ONCE DAILY   oxyCODONE 5 MG immediate release tablet Commonly known as:  Oxy IR/ROXICODONE Take 1-2 tablets (5-10 mg total) by mouth every 6 (six) hours as needed for severe pain.   VITAMIN C PO Take by mouth.   VITAMIN D PO Take by mouth.       Activity: activity as tolerated Diet: regular diet Wound Care: Patient was instructed in care of JP drain  Follow-up:  With Dr. Excell Seltzer in 3 weeks.  Signed: Edward Jolly MD, FACS  12/27/2016, 8:34 AM

## 2016-12-28 NOTE — Progress Notes (Signed)
Location of Breast Cancer: Right Breast  Histology per Pathology Report:  06/21/16 Diagnosis 1. Breast, right, needle core biopsy, 10:00 o'clock - INVASIVE DUCTAL CARCINOMA. - SEE COMMENT.  Receptor Status: ER (95%), PR (80%), Ki- (20%), Her2-neu (NEG) 2. Lymph node, needle/core biopsy, right axilla - CARCINOMA.  Receptor Status: ER(100%), PR (90%), Her2-neu (NEG), Ki-(25%)  12/19/16 Diagnosis 1. Breast, lumpectomy, Right - INVASIVE DUCTAL CARCINOMA, GRADE II/III SPANNING 1.6 CM AND 0.3 CM. - THE SURGICAL RESECTION MARGINS ARE NEGATIVE FOR INVASIVE CARCINOMA. - METASTATIC CARCINOMA 1 OF 1 LYMPH NODE (1/1). - SEE ONCOLOGY TABLE BELOW. 2. Breast, excision, Right additional Inferior Margin - INVASIVE DUCTAL CARCINOMA, GRADE II/III, SPANNING 0.3 CM. - THE SURGICAL RESECTION MARGINS ARE NEGATIVE FOR CARCINOMA. 3. Lymph node, biopsy, Right Axillary - METASTATIC CARCINOMA IN 1 OF 1 LYMPH NODE (1/1) WITH EXTRACAPSULAR EXTENSION. 4. Lymph node, biopsy, Right Axillary - METASTATIC CARCINOMA IN 1 OF 1 LYMPH NODE (1/1). Microscopic Comment 1. CARCINOMA OF THE BREAST, STATUS POST NEOADJUVANT THERAPY  12/26/16 Axillary Dissection.   Did patient present with symptoms or was this found on screening mammography?: April [name] herself noted a change in her right breast sometime in March or April 2017  Past/Anticipated interventions by surgeon, if any: 12/19/16 Procedure: Procedure(s): Blue dye injection right breast RIGHT BREAST NEEDLE LOCALIZED LUMPECTOMY, RIGHT RADIOACTIVE SEED TARGETED AXILLARY SENTINEL LYMPH NODE BIOPSY REMOVAL PORT-A-CATH   Surgeon: Excell Seltzer T  12/26/16 Procedure: Procedure(s): RIGHT AXILLARY LYMPH NODE DISSECTION Surgeon: Excell Seltzer T   She has a drain in place from her surgery. She reports that she is emptying about 50 cc's of serous sanguinous drainage 5-6 times daily. She currently does not have an apt scheduled with Dr. Excell Seltzer until February 16th.   She also stapes in place from her surgery.   Past/Anticipated interventions by medical oncology, if any:  11/09/16 Dr. 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 Jana Hakim again after radiation is complete.   Lymphedema issues, if any:  She denies. She does have some difficulty raising her arm. She has a drain in place from her surgery on 12/26/16.   Pain issues, if any: She reports pain at her drainage tube site, and Right axilla area that extends down her Right lateral side.    SAFETY ISSUES:  Prior radiation? No  Pacemaker/ICD? No  Possible current pregnancy? no  Is the patient on methotrexate? No  Current Complaints / other details:  BP 113/85   Pulse 90   Temp 98.6 F (37 C)   Ht '5\' 4"'$  (1.626 m)   Wt 232 lb 12.8 oz (105.6 kg)  SpO2 100% Comment: room air  BMI 39.96 kg/m     Wt Readings from Last 3 Encounters:  01/02/17 232 lb 12.8 oz (105.6 kg)  12/26/16 232 lb (105.2 kg)  12/19/16 232 lb (105.2 kg)      April [name], [name], RN 12/28/2016,1:24 PM

## 2017-01-02 ENCOUNTER — Ambulatory Visit: Payer: Medicaid Other | Admitting: Radiation Oncology

## 2017-01-02 ENCOUNTER — Encounter: Payer: Self-pay | Admitting: Radiation Oncology

## 2017-01-02 ENCOUNTER — Ambulatory Visit
Admission: RE | Admit: 2017-01-02 | Discharge: 2017-01-02 | Disposition: A | Discharge status: Home / Self Care | Payer: Medicaid Other | Source: Ambulatory Visit | Attending: Radiation Oncology | Admitting: Radiation Oncology

## 2017-01-02 VITALS — BP 113/85 | HR 90 | Temp 98.6°F | Ht 64.0 in | Wt 232.8 lb

## 2017-01-02 DIAGNOSIS — C50411 Malignant neoplasm of upper-outer quadrant of right female breast: Secondary | ICD-10-CM | POA: Insufficient documentation

## 2017-01-02 DIAGNOSIS — C773 Secondary and unspecified malignant neoplasm of axilla and upper limb lymph nodes: Secondary | ICD-10-CM | POA: Diagnosis not present

## 2017-01-02 DIAGNOSIS — Z170 Estrogen receptor positive status [ER+]: Secondary | ICD-10-CM | POA: Insufficient documentation

## 2017-01-02 HISTORY — DX: Encounter for screening for other disorder: Z13.89

## 2017-01-02 NOTE — Progress Notes (Signed)
Radiation Oncology         [phone] ________________________________  Name: April [name] MRN: [id number]  Date: 01/02/2017  DOB: [birth year]  Follow-Up Visit Note  Outpatient  CC: MULBERRY,ELIZABETH, MD  Magrinat, Virgie Dad, MD  Diagnosis:      ICD-9-CM ICD-10-CM   1. Malignant neoplasm of upper-outer quadrant of right breast in female, estrogen receptor positive ([place]) 174.4 C50.411    V86.0 Z17.0   Clinical Stage IIB T2N1M0 Right Breast UOQ Invasive Ductal Carcinoma, ER+ / PR+ / Her2 -, Grade 3;  ypT1c, ypN2a Stage IIIA CHIEF COMPLAINT: Here to discuss management of right breast cancer  Narrative:  The patient returns today for follow-up.     Since consultation, she underwent neoadjuvant chemotherapy (dose dense AC  --> taxol , doses and numbers of cycles modified) followed by 12-19-16 right lumpectomy and sentinel lymph node biopsy, then 12-26-16 axillary dissection.  Pathology revealed a dominant 1.6cm tumor and two 0.3cm satellite tumors, margins negative by at least 69m, and 3/3 positive sentinel nodes, ECE present.  Axillary dissection revealed 1/20 additional positive nodes.  She still has breast and axillary pain and some difficulty raising her right arm.  No lymphedema.            ALLERGIES:  is allergic to hydrocodone; ultram [tramadol hcl]; and gabapentin.  Meds: Current Outpatient Prescriptions  Medication Sig Dispense Refill  . acyclovir (ZOVIRAX) 400 MG tablet 1 tab by mouth twice daily 60 tablet 11  . b complex vitamins tablet Take 1 tablet by mouth daily.    . Cholecalciferol (VITAMIN D PO) Take by mouth.    . desloratadine (CLARINEX) 5 MG tablet Take 1 tablet (5 mg total) by mouth daily. 30 tablet 11  . DULoxetine (CYMBALTA) 30 MG capsule 1 cap by mouth with 60  mg cap once daily for total of 90 mg daily (Patient taking differently: Take 30 mg by mouth See admin instructions. Take 1 capsule by mouth daily along with 60  mg cap once daily for total of 90 mg  daily) 30 capsule 11  . DULoxetine (CYMBALTA) 60 MG capsule Take 1 capsule (60 mg total) by mouth daily. Take along with '30mg'$  capsule for a total of '90mg'$  of cymbalta per day. 30 capsule 11  . ibuprofen (ADVIL,MOTRIN) 800 MG tablet TAKE 1 TABLET BY MOUTH EVERY 8 HOURS AS NEEDED. 30 tablet 0  . mometasone (NASONEX) 50 MCG/ACT nasal spray 2 sprays each nostril daily 17 g 11  . omeprazole (PRILOSEC) 40 MG capsule TAKE 1 CAPSULE BY MOUTH ONCE DAILY 30 capsule 3  . oxyCODONE (OXY IR/ROXICODONE) 5 MG immediate release tablet Take 1-2 tablets (5-10 mg total) by mouth every 6 (six) hours as needed for severe pain. 20 tablet 0  . butalbital-acetaminophen-caffeine (FIORICET, ESGIC) 50-325-40 MG tablet TAKE 2 TABLETS BY MOUTH EVERY 6 HOURS AS NEEDED FOR HEADACHE/MIGRAINE (Patient not taking: Reported on 01/02/2017) 14 tablet 0   No current facility-administered medications for this encounter.    Facility-Administered Medications Ordered in Other Encounters  Medication Dose Route Frequency Provider Last Rate Last Dose  . sodium chloride 0.9 % injection 10 mL  10 mL Intravenous PRN GChauncey Cruel MD   10 mL at 10/11/16 0754    Physical Findings:  height is '5\' 4"'$  (1.626 m) and weight is 232 lb 12.8 oz (105.6 kg). Her temperature is 98.6 F (37 C). Her blood pressure is 113/85 and her pulse is 90. Her oxygen saturation is 100%. .Marland Kitchen  General: Alert and oriented, in no acute distress HEENT: Head is normocephalic. Extraocular movements are intact. Oropharynx is clear. Alopecia. Neck: Neck is supple, no palpable cervical or supraclavicular lymphadenopathy. Heart: Regular in rate and rhythm with no murmurs, rubs, or gallops. Chest: Clear to auscultation bilaterally, with no rhonchi, wheezes, or rales. Abdomen: Soft, nontender, nondistended, with no rigidity or guarding. Extremities: No cyanosis or edema. Lymphatics: see Neck Exam Neurologic: No obvious focalities. Speech is fluent.  Psychiatric: Judgment  and insight are intact. Affect is appropriate. Breast exam reveals mild swelling, right breast, with right axillary staples and JP drain laterally.  Left breast/ axilla  without masses.  Lab Findings: Lab Results  Component Value Date   WBC 1.2 (L) 11/15/2016   HGB 12.8 12/19/2016   HCT 32.9 (L) 11/15/2016   MCV 97.4 11/15/2016   PLT 247 11/15/2016    '@LASTCHEMISTRY'$ @  Radiographic Findings: Mm Breast Surgical Specimen  Result Date: 12/19/2016 CLINICAL DATA:  56 year old female with history of right breast cancer and axillary lymph node metastases post lumpectomy and axillary lymph node excision. EXAM: SPECIMEN RADIOGRAPH OF THE RIGHT BREAST COMPARISON:  Previous exam(s). FINDINGS: Status post excision of the right breast. The radioactive seed and coil shaped biopsy marker clip are present, completely intact, and were marked for pathology. The lymph node is present along the edge of the specimen radiograph. Additionally, the wire tip and ribbon shaped biopsy marker clip are present and are marked for pathology. IMPRESSION: Specimen radiograph of the right breast. Electronically Signed   By: Everlean Alstrom M.D.   On: 12/19/2016 12:35   Mm Rt Radioactive Seed Loc Mammo Guide  Result Date: 12/19/2016 CLINICAL DATA:  56 year old female with right breast cancer presents for preoperative wire localization of right breast mass. EXAM: NEEDLE LOCALIZATION OF THE RIGHT BREAST WITH MAMMO GUIDANCE COMPARISON:  Previous exams. FINDINGS: Patient presents for needle localization prior to right breast lumpectomy. I met with the patient and we discussed the procedure of needle localization including benefits and alternatives. We discussed the high likelihood of a successful procedure. We discussed the risks of the procedure, including infection, bleeding, tissue injury, and further surgery. Informed, written consent was given. The usual time-out protocol was performed immediately prior to the procedure. Using  mammographic guidance, sterile technique, 1% lidocaine and a 7 cm modified Kopans needle, the ribbon shaped biopsy marking clip in the upper-outer right breast was localized using lateral to medial approach. The images were marked for Dr. Excell Seltzer. IMPRESSION: Needle localization right breast breast. No apparent complications. Electronically Signed   By: Everlean Alstrom M.D.   On: 12/19/2016 08:38   Mm Rt Plc Breast Loc Dev   1st Lesion  Inc Mammo Guide  Result Date: 12/19/2016 CLINICAL DATA:  56 year old female with right breast cancer and biopsy proven right axillary lymph node metastases presents for preoperative seed localization of low right axillary lymph node containing a HydroMARK coil shaped biopsy marking clip. EXAM: MAMMOGRAPHIC GUIDED RADIOACTIVE SEED LOCALIZATION OF THE RIGHT BREAST COMPARISON:  Previous exam(s). FINDINGS: Patient presents for radioactive seed localization prior to right breast lumpectomy and right axillary lymph node dissection. I met with the patient and we discussed the procedure of seed localization including benefits and alternatives. We discussed the high likelihood of a successful procedure. We discussed the risks of the procedure including infection, bleeding, tissue injury and further surgery. We discussed the low dose of radioactivity involved in the procedure. Informed, written consent was given. The usual time-out protocol was performed immediately  prior to the procedure. Using mammographic guidance, sterile technique, 1% lidocaine and an I-125 radioactive seed, the lymph node containing the coil shaped biopsy marking clip was localized using a lateral to medial approach. The follow-up mammogram images confirm the seed in the expected location and were marked for Dr. Excell Seltzer. Follow-up survey of the patient confirms presence of the radioactive seed. Order number of I-125 seed:  [id number]. Total activity:  0.814 millicuries  Reference Date: 11/20/2016 The patient  tolerated the procedure well and was released from the [place]. She was given instructions regarding seed removal. IMPRESSION: Radioactive seed localization right axillary lymph node. No apparent complications. Electronically Signed   By: Everlean Alstrom M.D.   On: 12/19/2016 08:37    Impression/Plan: Right breast cancer, locally advanced  We discussed adjuvant radiotherapy today.  I recommend radiotherapy to the right breast and regional nodes in order to reduce risk of locoregional recurrence by 2/3 and improve OS.  The risks, benefits and side effects of this treatment were discussed in detail.  She understands that radiotherapy is associated with skin irritation and fatigue in the acute setting. Late effects can include cosmetic changes and rare injury to internal organs.   She is enthusiastic about proceeding with treatment. A consent form has been signed and placed in her chart.    We can schedule her CT simulation once she is cleared by her surgeon. I'll send him a note to ask him to get in touch when she is ready.   Risk of arm lymphedema - refer to PT. _____________________________________   [name], MD

## 2017-01-04 ENCOUNTER — Telehealth: Payer: Self-pay | Admitting: *Deleted

## 2017-01-04 ENCOUNTER — Ambulatory Visit: Payer: Medicaid Other | Attending: Radiation Oncology | Admitting: Physical Therapy

## 2017-01-04 DIAGNOSIS — Z483 Aftercare following surgery for neoplasm: Secondary | ICD-10-CM

## 2017-01-04 DIAGNOSIS — M25511 Pain in right shoulder: Secondary | ICD-10-CM | POA: Insufficient documentation

## 2017-01-04 DIAGNOSIS — M25611 Stiffness of right shoulder, not elsewhere classified: Secondary | ICD-10-CM | POA: Diagnosis present

## 2017-01-04 NOTE — Patient Instructions (Addendum)
SHOULDER: Flexion - Supine (Cane)        Cancer Rehab [phone]    Hold cane in both hands. Raise arms up overhead. Do not allow back to arch. Hold _5__ seconds. Do __5-10__ times; __1-2__ times a day.   SELF ASSISTED WITH OBJECT: Shoulder Abduction / Adduction - Supine    Hold cane with both hands. Move both arms from side to side, keep elbows straight.  Hold when stretch felt for __5__ seconds. Repeat __5-10__ times; __1-2__ times a day. Once this becomes easier progress to third picture bringing affected arm towards ear by staying out to side. Same hold for _5_seconds. Repeat  _5-10_ times, _1-2_ times/day.  Shoulder Blade Stretch    Clasp fingers behind head with elbows touching in front of face. Pull elbows back while pressing shoulder blades together. Relax and hold as tolerated, can place pillow under elbow here for comfort as needed and to allow for prolonged stretch.  Repeat __5__ times. Do __1-2__ sessions per day.         First of all, check with your insurance company to see if provider is in [place]                                            [address]  [place], [place] [address] [phone]    Does not file for insurance--- call for appointment with [facility]   (for wigs and compression sleeves and gloves)  [place], [place] [address] [phone]  Will file some insurances --- call for appointment   Second to [facility] (for mastectomy prosthetics and garments) [place], [place] [address] [phone] Will file some insurances --- call for appointment  [facility]  [address]  [place], [place] [address] [phone] Lower extremity garments  [place], owner [phone])   [phone] phone)    [phone] (fax) Will do home visit to measure for custom day and nighttime garments, can get pumps Will file  insurance  [employer] Sales rep:  [employer] [id number]  [phone] [email or web address] Biocompression pumps   [employer]  Sales rep: Donneta Romberg:  [phone] [email or web address] Lyndal Pulley and Flexitouch pumps    Other Resources: National Lymphedema Network:  www.lymphnet.org [email or web address] for patient articles and purchase a self manual lymph drainage DVD [email or web address] has informative articles.

## 2017-01-04 NOTE — Telephone Encounter (Signed)
PATIENT HAD PT APPT. ON 01-04-17 @ 1 PM @ [facility]

## 2017-01-04 NOTE — Therapy (Signed)
April [name] [place], [place], [address] Phone: [phone]   Fax:  [phone]  Physical Therapy Evaluation  Patient Details  Name: April [name] MRN: [id number] Date of Birth: [birth year] Referring Provider: Isidore [name]   Encounter Date: 01/04/2017      PT End of Session - 01/04/17 1503    Visit Number 1   Number of Visits 4   Date for PT Re-Evaluation 07/04/17   PT Start Time 1300   PT Stop Time 1345   PT Time Calculation (min) 45 min   Activity Tolerance Patient tolerated treatment well   Behavior During Therapy [facility] for tasks assessed/performed      Past Medical History:  Diagnosis Date  . Allergy   . Anxiety   . Arthritis    knees  . Bilateral ankle fractures 07/2015   Booted  . Cancer April [name]) dx June 22, 2016   right breast  . Depression    Multiple  episodes  in past.  . Fibromyalgia 2013   diagnosed by April [name]  . Genital herpes 2005   Has outbreaks monthly  . GERD (gastroesophageal reflux disease)   . Migraine    migraines  . 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 Obesity   . Right wrist fracture 06/2015  . Skin condition    patient reports periodic episodes of severe itching. She will itch and then blister at areas including her arms, back, and buttocks.     Past Surgical History:  Procedure Laterality Date  . AXILLARY LYMPH NODE DISSECTION Right 12/26/2016   Procedure: RIGHT AXILLARY LYMPH NODE DISSECTION;  Surgeon: April Seltzer, MD;  Location: [facility];  Service: General;  Laterality: Right;  . BREAST LUMPECTOMY WITH NEEDLE LOCALIZATION Right 12/19/2016   Procedure: RIGHT BREAST NEEDLE LOCALIZED LUMPECTOMY, RIGHT RADIOACTIVE SEED TARGETED AXILLARY SENTINEL LYMPH NODE BIOPSY;  Surgeon: April Seltzer, MD;  Location: [facility];  Service: General;  Laterality: Right;  . IR GENERIC HISTORICAL  10/09/2016   IR CV  LINE INJECTION 10/09/2016 April Edouard, MD [facility]-INTERV RAD  . PORT-A-CATH REMOVAL Left 12/19/2016   Procedure: REMOVAL PORT-A-CATH;  Surgeon: April Seltzer, MD;  Location: [facility];  Service: General;  Laterality: Left;  . PORTACATH PLACEMENT N/A 07/11/2016   Procedure: INSERTION PORT-A-CATH;  Surgeon: April Seltzer, MD;  Location: [facility];  Service: General;  Laterality: N/A;  . RADIOACTIVE SEED GUIDED AXILLARY SENTINEL LYMPH NODE Right 12/19/2016   Procedure: RADIOACTIVE SEED GUIDED AXILLARY SENTINEL LYMPH NODE BIOPSY;  Surgeon: April Seltzer, MD;  Location: [facility];  Service: General;  Laterality: Right;  . WISDOM TOOTH EXTRACTION  yrs ago    There were no vitals filed for this visit.       Subjective Assessment - 01/04/17 1311    Subjective Pt states has numbess in the back of her right arm.  Numbness in fingerg and toes from chemo pt states she has gained about 35 pounds during chemo .     Pertinent History Right breasat cancer with chemotherapy  that had to be stopped due to peripheral neuropathy followed by lumpectomy on 1/26/ 2018 with 3 positive nodes removed followed by full ALND on 12/26/2016 with 1/20 positive nodes.  Pt will start radiation therapy in about 4 weeks  She still has drian intact that is still needs to be emptied several times a day and will see her surgeon tomorrow.  Patient Stated Goals to get rid of the pain in her arm and armpit    Currently in Pain? Yes   Pain Score 10-Worst pain ever   Pain Location Axilla   Pain Orientation Right   Pain Descriptors / Indicators Constant;Aching   Pain Type Surgical pain   Pain Radiating Towards radiates to back    Aggravating Factors  worse when she moves her arm    Pain Relieving Factors keeping it still, massaging her arm and armpit.    Effect of Pain on Daily Activities interferes with daily activities             [facility] PT Assessment - 01/04/17 0001      Assessment    Medical Diagnosis breast cancer    Referring Provider April [name]    Onset Date/Surgical Date 12/19/16  also jan 23 for ALND    Hand Dominance Right   Prior Therapy none     Precautions   Precautions Other (comment)   Precaution Comments previous chemotherapy      Restrictions   Weight Bearing Restrictions No     Balance Screen   Has the patient fallen in the past 6 months No   Has the patient had a decrease in activity level because of a fear of falling?  No   Is the patient reluctant to leave their home because of a fear of falling?  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  She has staple in incision in right anterior axilla that have redness around them.  Pt states she is going to surgeon tomorrow to have them removed.    Skin Integrity no open areas    Quick DASH  56.82     Sensation   Light Touch Impaired by gross assessment   Additional Comments pt reports numbness in back of right arm      Coordination   Gross Motor Movements are Fluid and Coordinated Yes     Posture/Postural Control   Posture/Postural Control Postural limitations   Postural Limitations Rounded Shoulders;Forward head   Posture Comments pt with obesity, reports recent weight gain      AROM   Overall AROM Comments drain intact therefore  did not raise right shoulder above 90 degrees    Right Shoulder Flexion 90 Degrees  drain in place    Right Shoulder ABduction 90 Degrees  drain in place    Right Shoulder External Rotation 90 Degrees   Left Shoulder Flexion 158 Degrees   Left Shoulder ABduction 150 Degrees   Left Shoulder External Rotation 90 Degrees     Strength   Overall Strength Within functional limits for tasks performed   Overall Strength Comments pt reports she walked to this appoinment  LYMPHEDEMA/ONCOLOGY QUESTIONNAIRE - 01/04/17 1332      Right Upper Extremity Lymphedema   10 cm Proximal to Olecranon Process 38 cm   Olecranon Process 30.5 cm   15 cm Proximal to Ulnar Styloid Process 31.5 cm   10 cm Proximal to Ulnar Styloid Process 28.5 cm   Just Proximal to Ulnar Styloid Process 19 cm   Across Hand at [employer] 21 cm   At [place] of 2nd Digit 6.6 cm     Left Upper Extremity Lymphedema   10 cm Proximal to Olecranon Process 38 cm   Olecranon Process 31 cm   15 cm Proximal to Ulnar Styloid Process 31 cm   10 cm Proximal to Ulnar Styloid Process 28 cm   Just Proximal to Ulnar Styloid Process 19 cm   Across Hand at [employer] 21.5 cm   At [place] of 2nd Digit 6.6 cm           Quick Dash - 01/04/17 0001    Open a tight or new jar Mild difficulty   Do heavy household chores (wash walls, wash floors) Moderate difficulty   Carry a shopping bag or briefcase Moderate difficulty   Wash your back Unable   Use a knife to cut food Moderate difficulty   Recreational activities in which you take some force or impact through your arm, shoulder, or hand (golf, hammering, tennis) Moderate difficulty   During the past week, to what extent has your arm, shoulder or hand problem interfered with your normal social activities with family, friends, neighbors, or groups? Modererately   During the past week, to what extent has your arm, shoulder or hand problem limited your work or other regular  daily activities Modererately   Arm, shoulder, or hand pain. Extreme   Tingling (pins and needles) in your arm, shoulder, or hand Moderate   Difficulty Sleeping Moderate difficulty   DASH Score 56.82 %                     PT Education - 01/04/17 1502    Education provided Yes   Education Details Do not do exercises that raise arm over your head until drains are out and MD gives the okay.  Issued supine dowel rod exercise and demonstrated them. Issued ABC flyer and suggested pt come when drains are out and she can advance exercise    Person(s) Educated Patient   Methods Explanation;Demonstration;Handout   Comprehension Verbalized understanding                [place] - 01/04/17 1738      CC Long Term Goal  #1   Title Patient will be independent in a  home exercise program   Baseline no knowledge    Time 3   Period --  authorizaion period    Status New     CC Long Term Goal  #2   Title Patient will report a decrease in pain by 50% so they can perform daily activities with greater ease   Baseline  10/10 pain in axilla    Time 3   Period --  authorizaion period    Status New     CC Long Term Goal  #3   Title Patient will decrease the DASH score to < 40   to demonstrate increased functional use of upper extremity   Baseline 56.82   Time 3   Period --  authorizaion period    Status  New     CC Long Term Goal  #4   Title Patient with verbalize an understanding of lymphedema risk reduction precautions   Baseline no knowledge    Time 3   Period --  authorization period    Status New            Plan - 01/04/17 1504    Clinical Impression Statement Obese 56 yo female with ALND 10 days ago comes in with drain still intact.  She has significant pain in right arm and axilla but is not yet ready to advance range of motion exercise. She has peripheral neuropathy and walks or uses public transportation for appointments  Educated pt on  exercise that she can progress to and to come to [name] class and eventual [employer] class. Will send for Medicaid authorization for 3 visits to be spread out over  6 months so that she can be reeducated as she progresses.  She knows to contact her MD if she develops any signs of lymphedema for reorder and resubmission to Medicaid  Because of the multiple comorbidites and personal factors and evolving nature of her condition this is a moderate complexity eval    Rehab Potential Good   Clinical Impairments Affecting Rehab Potential ALND , previous chemotherapy with peripheral neuropathy    PT Frequency Other (comment)  3 visits   PT Duration Other (comment)  authorizaion period    PT Treatment/Interventions ADLs/Self Care Home Management;Patient/family education;DME Instruction;Functional mobility training;Manual techniques;Therapeutic activities;Therapeutic exercise;Neuromuscular re-education;Passive range of motion   PT Next Visit Plan Acitve and passive range of motion and exercise, update HEP    Consulted and Agree with Plan of Care Patient      Patient will benefit from skilled therapeutic intervention in order to improve the following deficits and impairments:  Obesity, Pain, Impaired UE functional use, Decreased range of motion, Decreased strength, Decreased knowledge of use of DME, Decreased knowledge of precautions, Decreased skin integrity, Postural dysfunction  Visit Diagnosis: Acute pain of right shoulder - Plan: PT plan of care cert/re-cert  Stiffness of right shoulder joint - Plan: PT plan of care cert/re-cert  Aftercare following surgery for neoplasm - Plan: PT plan of care cert/re-cert     Problem List Patient Active Problem List   Diagnosis Date Noted  . Cancer of left breast, stage 3 ([place]) 12/26/2016  . Port catheter in place 08/14/2016  . Positive blood culture 08/10/2016  . Cellulitis of hand, left 08/10/2016  . Hypokalemia 08/10/2016  . Neutropenic fever ([place])  08/07/2016  . Hyponatremia 08/07/2016  . Headache 07/26/2016  . Mucositis due to chemotherapy 07/26/2016  . Febrile neutropenia ([place]) 07/25/2016  . Breast cancer of upper-outer quadrant of right female breast ([place]) 06/28/2016  . Breast cancer metastasized to axillary lymph node ([place]) 06/23/2016  . Environmental and seasonal allergies 06/14/2016  . Elevated blood pressure reading without diagnosis of hypertension 04/28/2016  . Rash and nonspecific skin eruption 03/15/2016  . Herpes genitalis 03/15/2016  . Fracture 07/13/2015  . Bilateral fibular fractures 07/13/2015  . Fibromyalgia   . Anxiety 02/07/2012  . Perimenopausal 01/04/2012  . Depression (emotion) 01/04/2012   Donato Heinz. Owens Shark PT  Norwood Levo 01/04/2017, 5:48 PM  [place], [place], [address] Phone: [phone]   Fax:  [phone]  Name: April [name] MRN: [id number] Date of Birth: 1961-05-30

## 2017-01-19 ENCOUNTER — Other Ambulatory Visit: Payer: Self-pay | Admitting: General Surgery

## 2017-01-19 DIAGNOSIS — R591 Generalized enlarged lymph nodes: Secondary | ICD-10-CM

## 2017-01-22 ENCOUNTER — Other Ambulatory Visit: Payer: Self-pay | Admitting: General Surgery

## 2017-01-22 ENCOUNTER — Ambulatory Visit (HOSPITAL_COMMUNITY): Payer: Medicaid Other

## 2017-01-22 ENCOUNTER — Ambulatory Visit
Admission: RE | Admit: 2017-01-22 | Discharge: 2017-01-22 | Disposition: A | Discharge status: Home / Self Care | Payer: Medicaid Other | Source: Ambulatory Visit | Attending: General Surgery | Admitting: General Surgery

## 2017-01-22 DIAGNOSIS — R591 Generalized enlarged lymph nodes: Secondary | ICD-10-CM

## 2017-01-30 ENCOUNTER — Ambulatory Visit
Admission: RE | Admit: 2017-01-30 | Discharge: 2017-01-30 | Disposition: A | Discharge status: Home / Self Care | Payer: Medicaid Other | Source: Ambulatory Visit | Attending: Radiation Oncology | Admitting: Radiation Oncology

## 2017-01-30 DIAGNOSIS — Z170 Estrogen receptor positive status [ER+]: Principal | ICD-10-CM

## 2017-01-30 DIAGNOSIS — C50411 Malignant neoplasm of upper-outer quadrant of right female breast: Secondary | ICD-10-CM

## 2017-02-01 ENCOUNTER — Telehealth: Payer: Self-pay | Admitting: Oncology

## 2017-02-01 NOTE — Progress Notes (Signed)
  Radiation Oncology         [phone] ________________________________  Name: April [name] MRN: [id number]  Date: 01/30/2017  DOB: 04-28-1961  SIMULATION AND TREATMENT PLANNING NOTE    Outpatient  DIAGNOSIS:     ICD-9-CM ICD-10-CM   1. Malignant neoplasm of upper-outer quadrant of right breast in female, estrogen receptor positive ([place]) 174.4 C50.411    V86.0 Z17.0     NARRATIVE:  The patient was brought to the [facility].  Identity was confirmed.  All relevant records and images related to the planned course of therapy were reviewed.  The patient freely provided informed written consent to proceed with treatment after reviewing the details related to the planned course of therapy. The consent form was witnessed and verified by the simulation staff.    Then, the patient was set-up in a stable reproducible supine position for radiation therapy with her ipsilateral arm over her head, and her upper body secured in a custom-made Vac-lok device.  CT images were obtained.  Surface markings were placed.  The CT images were loaded into the planning software.    TREATMENT PLANNING NOTE: Treatment planning then occurred.  The radiation prescription was entered and confirmed.     A total of 5 medically necessary complex treatment devices were fabricated and supervised by me: 4 fields with MLCs for custom blocks to protect heart, and lungs;  and, a Vac-lok. MORE COMPLEX DEVICES MAY BE MADE IN DOSIMETRY FOR FIELD IN FIELD BEAMS FOR DOSE HOMOGENEITY.  I have requested : 3D Simulation which is medically necessary to give adequate dose to at risk tissues while sparing lungs and heart.  I have requested a DVH of the following structures: lungs, heart, lumpectomy cavity.    The patient will receive 50 Gy in 25 fractions to the right breast with 2 tangential fields.  The IM nodes will be included in these fields.  Two additional fields will treat SCV/axillary nodes to 50Gy/25 fractions.  This will be followed by a boost to the lumpectomy site.  Optical Surface Tracking Plan:  Since intensity modulated radiotherapy (IMRT) and 3D conformal radiation treatment methods are predicated on accurate and precise positioning for treatment, intrafraction motion monitoring is medically necessary to ensure accurate and safe treatment delivery. The ability to quantify intrafraction motion without excessive ionizing radiation dose can only be performed with optical surface tracking. Accordingly, surface imaging offers the opportunity to obtain 3D measurements of patient position throughout IMRT and 3D treatments without excessive radiation exposure. I am ordering optical surface tracking for this patient's upcoming course of radiotherapy.  ________________________________   Reference:  Ursula Alert, J, et al. Surface imaging-based analysis of intrafraction motion for breast radiotherapy patients.Journal of [employer], n. 6, nov. 2014. ISSN [id number].  Available at: <http://www.jacmp.org/index.php/jacmp/article/view/4957>.    -----------------------------------  [name], MD

## 2017-02-01 NOTE — Telephone Encounter (Signed)
sw pt to confirm 4/17 appt at 0930 per LOS

## 2017-02-02 ENCOUNTER — Other Ambulatory Visit: Payer: Self-pay

## 2017-02-02 DIAGNOSIS — C50411 Malignant neoplasm of upper-outer quadrant of right female breast: Secondary | ICD-10-CM | POA: Diagnosis not present

## 2017-02-02 DIAGNOSIS — A6000 Herpesviral infection of urogenital system, unspecified: Secondary | ICD-10-CM

## 2017-02-02 MED ORDER — ACYCLOVIR 400 MG PO TABS: ORAL_TABLET | ORAL | 11 refills | Status: DC

## 2017-02-06 ENCOUNTER — Ambulatory Visit
Admission: RE | Admit: 2017-02-06 | Discharge: 2017-02-06 | Disposition: A | Discharge status: Home / Self Care | Payer: Medicaid Other | Source: Ambulatory Visit | Attending: Radiation Oncology | Admitting: Radiation Oncology

## 2017-02-06 DIAGNOSIS — C50411 Malignant neoplasm of upper-outer quadrant of right female breast: Secondary | ICD-10-CM | POA: Diagnosis not present

## 2017-02-07 ENCOUNTER — Ambulatory Visit
Admission: RE | Admit: 2017-02-07 | Discharge: 2017-02-07 | Disposition: A | Discharge status: Home / Self Care | Payer: Medicaid Other | Source: Ambulatory Visit | Attending: Radiation Oncology | Admitting: Radiation Oncology

## 2017-02-07 DIAGNOSIS — C50411 Malignant neoplasm of upper-outer quadrant of right female breast: Secondary | ICD-10-CM | POA: Diagnosis not present

## 2017-02-08 ENCOUNTER — Ambulatory Visit
Admission: RE | Admit: 2017-02-08 | Discharge: 2017-02-08 | Disposition: A | Discharge status: Home / Self Care | Payer: Medicaid Other | Source: Ambulatory Visit | Attending: Radiation Oncology | Admitting: Radiation Oncology

## 2017-02-08 DIAGNOSIS — C50411 Malignant neoplasm of upper-outer quadrant of right female breast: Secondary | ICD-10-CM | POA: Diagnosis not present

## 2017-02-09 ENCOUNTER — Ambulatory Visit
Admission: RE | Admit: 2017-02-09 | Discharge: 2017-02-09 | Disposition: A | Discharge status: Home / Self Care | Payer: Medicaid Other | Source: Ambulatory Visit | Attending: Radiation Oncology | Admitting: Radiation Oncology

## 2017-02-09 DIAGNOSIS — C50411 Malignant neoplasm of upper-outer quadrant of right female breast: Secondary | ICD-10-CM | POA: Diagnosis not present

## 2017-02-12 ENCOUNTER — Ambulatory Visit: Admission: RE | Admit: 2017-02-12 | Payer: Medicaid Other | Source: Ambulatory Visit | Admitting: Radiation Oncology

## 2017-02-12 ENCOUNTER — Ambulatory Visit: Payer: Medicaid Other

## 2017-02-13 ENCOUNTER — Encounter: Payer: Self-pay | Admitting: Radiation Oncology

## 2017-02-13 ENCOUNTER — Ambulatory Visit
Admission: RE | Admit: 2017-02-13 | Discharge: 2017-02-13 | Disposition: A | Discharge status: Home / Self Care | Payer: Medicaid Other | Source: Ambulatory Visit | Attending: Radiation Oncology | Admitting: Radiation Oncology

## 2017-02-13 VITALS — BP 143/103 | HR 109 | Temp 99.1°F | Ht 64.0 in | Wt 229.0 lb

## 2017-02-13 DIAGNOSIS — C50919 Malignant neoplasm of unspecified site of unspecified female breast: Secondary | ICD-10-CM

## 2017-02-13 DIAGNOSIS — C773 Secondary and unspecified malignant neoplasm of axilla and upper limb lymph nodes: Principal | ICD-10-CM

## 2017-02-13 DIAGNOSIS — C50411 Malignant neoplasm of upper-outer quadrant of right female breast: Secondary | ICD-10-CM

## 2017-02-13 DIAGNOSIS — Z170 Estrogen receptor positive status [ER+]: Principal | ICD-10-CM

## 2017-02-13 MED ORDER — RADIAPLEXRX EX GEL
Freq: Once | CUTANEOUS | Status: AC
  Administered 2017-02-13: 18:00:00 via TOPICAL

## 2017-02-13 NOTE — Progress Notes (Signed)
[name] is here for her 4th fraction of radiation to her Right Breast. She denies pain. She still has continued fatigue after chemotherapy. She was provided education and Radiaplex which she will begin using twice daily.   BP (!) 143/103   Pulse (!) 109   Temp 99.1 F (37.3 C)   Ht 5\' 4"  (1.626 m)   Wt 229 lb (103.9 kg)   SpO2 99% Comment: room air  BMI 39.31 kg/m    Wt Readings from Last 3 Encounters:  02/13/17 229 lb (103.9 kg)  01/02/17 232 lb 12.8 oz (105.6 kg)  12/26/16 232 lb (105.2 kg)

## 2017-02-13 NOTE — Progress Notes (Signed)
   Weekly Management Note:  Outpatient    ICD-9-CM ICD-10-CM   1. Malignant neoplasm of upper-outer quadrant of right breast in female, estrogen receptor positive (HCC) 174.4 C50.411    V86.0 Z17.0     Current Dose:  8 Gy  Projected Dose: 60 Gy   Narrative:  The patient presents for routine under treatment assessment.  CBCT/MVCT images/Port film x-rays were reviewed.  The chart was checked. Doing well. Had a post op breast infection in February, resolved.  Physical Findings:  height is 5\' 4"  (1.626 m) and weight is 229 lb (103.9 kg). Her temperature is 99.1 F (37.3 C). Her blood pressure is 143/103 (abnormal) and her pulse is 109 (abnormal). Her oxygen saturation is 99%.   Wt Readings from Last 3 Encounters:  02/13/17 229 lb (103.9 kg)  01/02/17 232 lb 12.8 oz (105.6 kg)  12/26/16 232 lb (105.2 kg)   NAD, slight erythema of right breast.  Impression:  The patient is tolerating radiotherapy.  Plan:  Continue radiotherapy as planned. Patient instructed to apply Radiaplex in treatment fields.     ________________________________   [name], M.D.

## 2017-02-13 NOTE — Progress Notes (Signed)

## 2017-02-14 ENCOUNTER — Ambulatory Visit
Admission: RE | Admit: 2017-02-14 | Discharge: 2017-02-14 | Disposition: A | Discharge status: Home / Self Care | Payer: Medicaid Other | Source: Ambulatory Visit | Attending: Radiation Oncology | Admitting: Radiation Oncology

## 2017-02-14 DIAGNOSIS — C50411 Malignant neoplasm of upper-outer quadrant of right female breast: Secondary | ICD-10-CM | POA: Diagnosis not present

## 2017-02-15 ENCOUNTER — Ambulatory Visit
Admission: RE | Admit: 2017-02-15 | Discharge: 2017-02-15 | Disposition: A | Discharge status: Home / Self Care | Payer: Medicaid Other | Source: Ambulatory Visit | Attending: Radiation Oncology | Admitting: Radiation Oncology

## 2017-02-15 DIAGNOSIS — C50411 Malignant neoplasm of upper-outer quadrant of right female breast: Secondary | ICD-10-CM | POA: Diagnosis not present

## 2017-02-16 ENCOUNTER — Ambulatory Visit
Admission: RE | Admit: 2017-02-16 | Discharge: 2017-02-16 | Disposition: A | Discharge status: Home / Self Care | Payer: Medicaid Other | Source: Ambulatory Visit | Attending: Radiation Oncology | Admitting: Radiation Oncology

## 2017-02-16 DIAGNOSIS — C50411 Malignant neoplasm of upper-outer quadrant of right female breast: Secondary | ICD-10-CM | POA: Diagnosis not present

## 2017-02-19 ENCOUNTER — Ambulatory Visit
Admission: RE | Admit: 2017-02-19 | Discharge: 2017-02-19 | Disposition: A | Discharge status: Home / Self Care | Payer: Medicaid Other | Source: Ambulatory Visit | Attending: Radiation Oncology | Admitting: Radiation Oncology

## 2017-02-19 ENCOUNTER — Encounter: Payer: Self-pay | Admitting: Radiation Oncology

## 2017-02-19 VITALS — BP 126/78 | HR 82 | Temp 97.9°F | Ht 64.0 in | Wt 235.0 lb

## 2017-02-19 DIAGNOSIS — C50411 Malignant neoplasm of upper-outer quadrant of right female breast: Secondary | ICD-10-CM | POA: Diagnosis not present

## 2017-02-19 DIAGNOSIS — Z170 Estrogen receptor positive status [ER+]: Principal | ICD-10-CM

## 2017-02-19 NOTE — Progress Notes (Signed)
[name] presents for her 8th fraction of radiation to her Right Breast. She denies pain. Her Right Breast is red and swollen. She is using the Radiaplex twice daily. She has some mild fatigue. She has no other concerns at this time.   BP 126/78   Pulse 82   Temp 97.9 F (36.6 C)   Ht 5\' 4"  (1.626 m)   Wt 235 lb (106.6 kg)   SpO2 98% Comment: room air  BMI 40.34 kg/m    Wt Readings from Last 3 Encounters:  02/19/17 235 lb (106.6 kg)  02/13/17 229 lb (103.9 kg)  01/02/17 232 lb 12.8 oz (105.6 kg)

## 2017-02-19 NOTE — Progress Notes (Signed)
   Weekly Management Note:  Outpatient    ICD-9-CM ICD-10-CM   1. Malignant neoplasm of upper-outer quadrant of right breast in female, estrogen receptor positive (HCC) 174.4 C50.411    V86.0 Z17.0     Current Dose:  16 Gy  Projected Dose: 60 Gy   Narrative:  The patient presents for routine under treatment assessment.  CBCT/MVCT images/Port film x-rays were reviewed.  The chart was checked. Doing well.    Physical Findings:  height is 5\' 4"  (1.626 m) and weight is 235 lb (106.6 kg). Her temperature is 97.9 F (36.6 C). Her blood pressure is 126/78 and her pulse is 82. Her oxygen saturation is 98%.   Wt Readings from Last 3 Encounters:  02/19/17 235 lb (106.6 kg)  02/13/17 229 lb (103.9 kg)  01/02/17 232 lb 12.8 oz (105.6 kg)   NAD, with erythema of right breast.  Impression:  The patient is tolerating radiotherapy.  Plan:  Continue radiotherapy as planned. Patient instructed to apply Radiaplex in treatment fields.     ________________________________   [name], M.D.

## 2017-02-20 ENCOUNTER — Ambulatory Visit
Admission: RE | Admit: 2017-02-20 | Discharge: 2017-02-20 | Disposition: A | Discharge status: Home / Self Care | Payer: Medicaid Other | Source: Ambulatory Visit | Attending: Radiation Oncology | Admitting: Radiation Oncology

## 2017-02-20 DIAGNOSIS — C50411 Malignant neoplasm of upper-outer quadrant of right female breast: Secondary | ICD-10-CM | POA: Diagnosis not present

## 2017-02-21 ENCOUNTER — Ambulatory Visit
Admission: RE | Admit: 2017-02-21 | Discharge: 2017-02-21 | Disposition: A | Discharge status: Home / Self Care | Payer: Medicaid Other | Source: Ambulatory Visit | Attending: Radiation Oncology | Admitting: Radiation Oncology

## 2017-02-21 DIAGNOSIS — C50411 Malignant neoplasm of upper-outer quadrant of right female breast: Secondary | ICD-10-CM | POA: Diagnosis not present

## 2017-02-22 ENCOUNTER — Ambulatory Visit
Admission: RE | Admit: 2017-02-22 | Discharge: 2017-02-22 | Disposition: A | Discharge status: Home / Self Care | Payer: Medicaid Other | Source: Ambulatory Visit | Attending: Radiation Oncology | Admitting: Radiation Oncology

## 2017-02-22 DIAGNOSIS — C50411 Malignant neoplasm of upper-outer quadrant of right female breast: Secondary | ICD-10-CM | POA: Diagnosis not present

## 2017-02-23 ENCOUNTER — Ambulatory Visit
Admission: RE | Admit: 2017-02-23 | Discharge: 2017-02-23 | Disposition: A | Discharge status: Home / Self Care | Payer: Medicaid Other | Source: Ambulatory Visit | Attending: Radiation Oncology | Admitting: Radiation Oncology

## 2017-02-23 DIAGNOSIS — C50411 Malignant neoplasm of upper-outer quadrant of right female breast: Secondary | ICD-10-CM | POA: Diagnosis not present

## 2017-02-26 ENCOUNTER — Ambulatory Visit
Admission: RE | Admit: 2017-02-26 | Discharge: 2017-02-26 | Disposition: A | Discharge status: Home / Self Care | Payer: Medicaid Other | Source: Ambulatory Visit | Attending: Radiation Oncology | Admitting: Radiation Oncology

## 2017-02-26 DIAGNOSIS — C50411 Malignant neoplasm of upper-outer quadrant of right female breast: Secondary | ICD-10-CM | POA: Diagnosis not present

## 2017-02-27 ENCOUNTER — Ambulatory Visit
Admission: RE | Admit: 2017-02-27 | Discharge: 2017-02-27 | Disposition: A | Discharge status: Home / Self Care | Payer: Medicaid Other | Source: Ambulatory Visit | Attending: Radiation Oncology | Admitting: Radiation Oncology

## 2017-02-27 ENCOUNTER — Encounter: Payer: Self-pay | Admitting: Radiation Oncology

## 2017-02-27 DIAGNOSIS — C50411 Malignant neoplasm of upper-outer quadrant of right female breast: Secondary | ICD-10-CM | POA: Diagnosis not present

## 2017-02-28 ENCOUNTER — Ambulatory Visit
Admission: RE | Admit: 2017-02-28 | Discharge: 2017-02-28 | Disposition: A | Discharge status: Home / Self Care | Payer: Medicaid Other | Source: Ambulatory Visit | Attending: Radiation Oncology | Admitting: Radiation Oncology

## 2017-02-28 DIAGNOSIS — C50411 Malignant neoplasm of upper-outer quadrant of right female breast: Secondary | ICD-10-CM | POA: Diagnosis not present

## 2017-02-28 NOTE — Progress Notes (Signed)
Photon Boost Complex Emergency planning/management officer Note Outpatient  Diagnosis:  ICD-9-CM ICD-10-CM  Malignant neoplasm of upper-outer quadrant of right breast in female, estrogen receptor positive ([place]) 174.4 C50.411  V86.0 Z17.0   The patient's CT images from her  simulation were reviewed to plan her boost treatment to her right breast  lumpectomy cavity.  The boost to the lumpectomy cavity will be delivered with 4 photon fields using MLCs for custom blocks again heart and lungs, with 6,10 and 15 MV photon energy.  This constitutes 4 complex treatment devices. Isodose plan was reviewed and approved. 10 Gy in 5 fractions prescribed.  -----------------------------------  [name], MD

## 2017-03-01 ENCOUNTER — Ambulatory Visit
Admission: RE | Admit: 2017-03-01 | Discharge: 2017-03-01 | Disposition: A | Discharge status: Home / Self Care | Payer: Medicaid Other | Source: Ambulatory Visit | Attending: Radiation Oncology | Admitting: Radiation Oncology

## 2017-03-01 DIAGNOSIS — C50411 Malignant neoplasm of upper-outer quadrant of right female breast: Secondary | ICD-10-CM | POA: Diagnosis not present

## 2017-03-02 ENCOUNTER — Ambulatory Visit
Admission: RE | Admit: 2017-03-02 | Discharge: 2017-03-02 | Disposition: A | Discharge status: Home / Self Care | Payer: Medicaid Other | Source: Ambulatory Visit | Attending: Radiation Oncology | Admitting: Radiation Oncology

## 2017-03-02 DIAGNOSIS — C50411 Malignant neoplasm of upper-outer quadrant of right female breast: Secondary | ICD-10-CM | POA: Diagnosis not present

## 2017-03-05 ENCOUNTER — Ambulatory Visit
Admission: RE | Admit: 2017-03-05 | Discharge: 2017-03-05 | Disposition: A | Discharge status: Home / Self Care | Payer: Medicaid Other | Source: Ambulatory Visit | Attending: Radiation Oncology | Admitting: Radiation Oncology

## 2017-03-05 ENCOUNTER — Encounter: Payer: Self-pay | Admitting: Radiation Oncology

## 2017-03-05 VITALS — BP 131/81 | HR 89 | Temp 98.2°F | Ht 64.0 in | Wt 238.0 lb

## 2017-03-05 DIAGNOSIS — C50411 Malignant neoplasm of upper-outer quadrant of right female breast: Secondary | ICD-10-CM | POA: Diagnosis not present

## 2017-03-05 DIAGNOSIS — Z170 Estrogen receptor positive status [ER+]: Secondary | ICD-10-CM | POA: Insufficient documentation

## 2017-03-05 MED ORDER — ALRA NON-METALLIC DEODORANT (RAD-ONC)
1.0000 "application " | Freq: Once | TOPICAL | Status: AC
  Administered 2017-03-05: 1 via TOPICAL

## 2017-03-05 MED ORDER — BIAFINE EX EMUL
Freq: Once | CUTANEOUS | Status: AC
  Administered 2017-03-05: 11:00:00 via TOPICAL

## 2017-03-05 MED ORDER — RADIAPLEXRX EX GEL
Freq: Once | CUTANEOUS | Status: AC
  Administered 2017-03-05: 11:00:00 via TOPICAL

## 2017-03-05 NOTE — Progress Notes (Signed)
   Weekly Management Note:  Outpatient    ICD-9-CM ICD-10-CM   1. Malignant neoplasm of upper-outer quadrant of right breast in female, estrogen receptor positive (HCC) 174.4 C50.411 hyaluronate sodium (RADIAPLEXRX) gel   V86.0 Z17.0 topical emolient (BIAFINE) emulsion     non-metallic deodorant (ALRA) 1 application    Current Dose:  36 Gy  Projected Dose: 60 Gy   Narrative:  The patient presents for routine under treatment assessment.  CBCT/MVCT images/Port film x-rays were reviewed.  The chart was checked. Patient denies pain. She reports fatigue in the afternoon. Per nursing, skin on the right breast is red with dermatitis. There is also a small open blister under the breast. She reports using radiaplex gel. She also noted itching to the breast.  Physical Findings:  height is 5\' 4"  (1.626 m) and weight is 238 lb (108 kg). Her oral temperature is 98.2 F (36.8 C). Her blood pressure is 131/81 and her pulse is 89. Her oxygen saturation is 98%.   Wt Readings from Last 3 Encounters:  03/05/17 238 lb (108 kg)  02/19/17 235 lb (106.6 kg)  02/13/17 229 lb (103.9 kg)   moderate erythema and hyperpigmentation changes to the right breast.   Impression:  The patient is tolerating radiotherapy.  Plan:  Continue radiotherapy as planned. Patient was provided a refill of Radiaplex, she was also given alra deodorant and biafine.     ________________________________   This document serves as a record of services personally performed by Gery Pray, MD. It was created on his behalf by Bethann Humble, a trained medical scribe. The creation of this record is based on the scribe's personal observations and the provider's statements to them. This document has been checked and approved by the attending provider.

## 2017-03-05 NOTE — Progress Notes (Signed)
April [name] has completed 18 fractions to her right breast.  She denies having pain.  She does report having fatigue and takes a nap in the afternoons.  She is using radiaplex gel and has been provided with a refill.  She has also been given Alra Deoderant.  The skin on her right breast is red with dermatitis.  She has a small open blister under her breast.  She reports having itching and has been given biafine to try.  BP 131/81 (BP Location: Left Arm, Patient Position: Sitting)   Pulse 89   Temp 98.2 F (36.8 C) (Oral)   Ht 5\' 4"  (1.626 m)   Wt 238 lb (108 kg)   SpO2 98%   BMI 40.85 kg/m    Wt Readings from Last 3 Encounters:  03/05/17 238 lb (108 kg)  02/19/17 235 lb (106.6 kg)  02/13/17 229 lb (103.9 kg)

## 2017-03-06 ENCOUNTER — Ambulatory Visit
Admission: RE | Admit: 2017-03-06 | Discharge: 2017-03-06 | Disposition: A | Discharge status: Home / Self Care | Payer: Medicaid Other | Source: Ambulatory Visit | Attending: Radiation Oncology | Admitting: Radiation Oncology

## 2017-03-06 DIAGNOSIS — C50411 Malignant neoplasm of upper-outer quadrant of right female breast: Secondary | ICD-10-CM | POA: Diagnosis not present

## 2017-03-07 ENCOUNTER — Ambulatory Visit
Admission: RE | Admit: 2017-03-07 | Discharge: 2017-03-07 | Disposition: A | Discharge status: Home / Self Care | Payer: Medicaid Other | Source: Ambulatory Visit | Attending: Radiation Oncology | Admitting: Radiation Oncology

## 2017-03-07 DIAGNOSIS — C50411 Malignant neoplasm of upper-outer quadrant of right female breast: Secondary | ICD-10-CM | POA: Diagnosis not present

## 2017-03-08 ENCOUNTER — Ambulatory Visit
Admission: RE | Admit: 2017-03-08 | Discharge: 2017-03-08 | Disposition: A | Discharge status: Home / Self Care | Payer: Medicaid Other | Source: Ambulatory Visit | Attending: Radiation Oncology | Admitting: Radiation Oncology

## 2017-03-08 DIAGNOSIS — C50411 Malignant neoplasm of upper-outer quadrant of right female breast: Secondary | ICD-10-CM | POA: Diagnosis not present

## 2017-03-09 ENCOUNTER — Ambulatory Visit
Admission: RE | Admit: 2017-03-09 | Discharge: 2017-03-09 | Disposition: A | Discharge status: Home / Self Care | Payer: Medicaid Other | Source: Ambulatory Visit | Attending: Radiation Oncology | Admitting: Radiation Oncology

## 2017-03-09 DIAGNOSIS — C50411 Malignant neoplasm of upper-outer quadrant of right female breast: Secondary | ICD-10-CM | POA: Diagnosis not present

## 2017-03-12 ENCOUNTER — Ambulatory Visit
Admission: RE | Admit: 2017-03-12 | Discharge: 2017-03-12 | Disposition: A | Discharge status: Home / Self Care | Payer: Medicaid Other | Source: Ambulatory Visit | Attending: Radiation Oncology | Admitting: Radiation Oncology

## 2017-03-12 DIAGNOSIS — C50411 Malignant neoplasm of upper-outer quadrant of right female breast: Secondary | ICD-10-CM | POA: Diagnosis not present

## 2017-03-13 ENCOUNTER — Telehealth: Payer: Self-pay | Admitting: *Deleted

## 2017-03-13 ENCOUNTER — Ambulatory Visit
Admission: RE | Admit: 2017-03-13 | Discharge: 2017-03-13 | Disposition: A | Discharge status: Home / Self Care | Payer: Medicaid Other | Source: Ambulatory Visit | Attending: Radiation Oncology | Admitting: Radiation Oncology

## 2017-03-13 DIAGNOSIS — C50411 Malignant neoplasm of upper-outer quadrant of right female breast: Secondary | ICD-10-CM | POA: Diagnosis not present

## 2017-03-13 NOTE — Telephone Encounter (Signed)
On 03-13-17 fax medical records to disability determination services, it was follow up note,   sim and planning note, weekly management notes

## 2017-03-14 ENCOUNTER — Ambulatory Visit
Admission: RE | Admit: 2017-03-14 | Discharge: 2017-03-14 | Disposition: A | Discharge status: Home / Self Care | Payer: Medicaid Other | Source: Ambulatory Visit | Attending: Radiation Oncology | Admitting: Radiation Oncology

## 2017-03-14 ENCOUNTER — Ambulatory Visit: Payer: Medicaid Other

## 2017-03-14 DIAGNOSIS — C50411 Malignant neoplasm of upper-outer quadrant of right female breast: Secondary | ICD-10-CM | POA: Diagnosis not present

## 2017-03-15 ENCOUNTER — Ambulatory Visit
Admission: RE | Admit: 2017-03-15 | Discharge: 2017-03-15 | Disposition: A | Discharge status: Home / Self Care | Payer: Medicaid Other | Source: Ambulatory Visit | Attending: Radiation Oncology | Admitting: Radiation Oncology

## 2017-03-15 ENCOUNTER — Ambulatory Visit: Payer: Medicaid Other

## 2017-03-15 DIAGNOSIS — C50411 Malignant neoplasm of upper-outer quadrant of right female breast: Secondary | ICD-10-CM | POA: Diagnosis not present

## 2017-03-16 ENCOUNTER — Ambulatory Visit: Payer: Medicaid Other

## 2017-03-16 ENCOUNTER — Ambulatory Visit
Admission: RE | Admit: 2017-03-16 | Discharge: 2017-03-16 | Disposition: A | Discharge status: Home / Self Care | Payer: Medicaid Other | Source: Ambulatory Visit | Attending: Radiation Oncology | Admitting: Radiation Oncology

## 2017-03-16 DIAGNOSIS — C50411 Malignant neoplasm of upper-outer quadrant of right female breast: Secondary | ICD-10-CM | POA: Diagnosis not present

## 2017-03-19 ENCOUNTER — Ambulatory Visit
Admission: RE | Admit: 2017-03-19 | Discharge: 2017-03-19 | Disposition: A | Discharge status: Home / Self Care | Payer: Medicaid Other | Source: Ambulatory Visit | Attending: Radiation Oncology | Admitting: Radiation Oncology

## 2017-03-19 ENCOUNTER — Ambulatory Visit: Payer: Medicaid Other

## 2017-03-19 DIAGNOSIS — C50912 Malignant neoplasm of unspecified site of left female breast: Secondary | ICD-10-CM

## 2017-03-19 DIAGNOSIS — C50411 Malignant neoplasm of upper-outer quadrant of right female breast: Secondary | ICD-10-CM | POA: Diagnosis not present

## 2017-03-19 MED ORDER — RADIAPLEXRX EX GEL
Freq: Once | CUTANEOUS | Status: AC
  Administered 2017-03-19: 12:00:00 via TOPICAL

## 2017-03-20 ENCOUNTER — Ambulatory Visit (HOSPITAL_BASED_OUTPATIENT_CLINIC_OR_DEPARTMENT_OTHER): Payer: Medicaid Other | Admitting: Oncology

## 2017-03-20 ENCOUNTER — Ambulatory Visit
Admission: RE | Admit: 2017-03-20 | Discharge: 2017-03-20 | Disposition: A | Discharge status: Home / Self Care | Payer: Medicaid Other | Source: Ambulatory Visit | Attending: Radiation Oncology | Admitting: Radiation Oncology

## 2017-03-20 ENCOUNTER — Ambulatory Visit: Payer: Medicaid Other

## 2017-03-20 ENCOUNTER — Other Ambulatory Visit: Payer: Self-pay | Admitting: Oncology

## 2017-03-20 VITALS — BP 138/81 | HR 98 | Temp 98.1°F | Resp 18 | Ht 64.0 in | Wt 231.4 lb

## 2017-03-20 DIAGNOSIS — R5383 Other fatigue: Secondary | ICD-10-CM | POA: Diagnosis not present

## 2017-03-20 DIAGNOSIS — C773 Secondary and unspecified malignant neoplasm of axilla and upper limb lymph nodes: Secondary | ICD-10-CM

## 2017-03-20 DIAGNOSIS — C50912 Malignant neoplasm of unspecified site of left female breast: Secondary | ICD-10-CM

## 2017-03-20 DIAGNOSIS — G709 Myoneural disorder, unspecified: Secondary | ICD-10-CM

## 2017-03-20 DIAGNOSIS — C50411 Malignant neoplasm of upper-outer quadrant of right female breast: Secondary | ICD-10-CM

## 2017-03-20 DIAGNOSIS — E2839 Other primary ovarian failure: Secondary | ICD-10-CM

## 2017-03-20 DIAGNOSIS — Z170 Estrogen receptor positive status [ER+]: Secondary | ICD-10-CM | POA: Diagnosis not present

## 2017-03-20 HISTORY — DX: Myoneural disorder, unspecified: G70.9

## 2017-03-20 MED ORDER — ANASTROZOLE 1 MG PO TABS: 1.0000 mg | ORAL_TABLET | Freq: Every day | ORAL | 4 refills | Status: DC
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
## 2017-03-21 ENCOUNTER — Encounter: Payer: Self-pay | Admitting: Radiation Oncology

## 2017-03-21 ENCOUNTER — Ambulatory Visit
Admission: RE | Admit: 2017-03-21 | Discharge: 2017-03-21 | Disposition: A | Discharge status: Home / Self Care | Payer: Medicaid Other | Source: Ambulatory Visit | Attending: Radiation Oncology | Admitting: Radiation Oncology

## 2017-03-21 ENCOUNTER — Telehealth: Payer: Self-pay | Admitting: *Deleted

## 2017-03-21 DIAGNOSIS — C50411 Malignant neoplasm of upper-outer quadrant of right female breast: Secondary | ICD-10-CM | POA: Diagnosis not present

## 2017-03-21 NOTE — Telephone Encounter (Signed)
  Oncology Nurse Navigator Documentation  Navigator Location: [facility] (03/21/17 1300)   )Navigator Encounter Type: Telephone (03/21/17 1300) Telephone: [place] Call (03/21/17 1300)     Surgery Date: 12/19/16 (03/21/17 1300)             Patient Visit Type: QGBEEF (03/21/17 1300) Treatment Phase: Final Radiation Tx (03/21/17 1300)     Interventions: Referrals (03/21/17 1300) Referrals: Survivorship (03/21/17 1300)                    Time Spent with Patient: 15 (03/21/17 1300)

## 2017-03-23 ENCOUNTER — Ambulatory Visit
Admission: RE | Admit: 2017-03-23 | Discharge: 2017-03-23 | Disposition: A | Discharge status: Home / Self Care | Payer: Medicaid Other | Source: Ambulatory Visit | Attending: Oncology | Admitting: Oncology

## 2017-03-23 DIAGNOSIS — M810 Age-related osteoporosis without current pathological fracture: Secondary | ICD-10-CM | POA: Insufficient documentation

## 2017-03-23 DIAGNOSIS — E2839 Other primary ovarian failure: Secondary | ICD-10-CM

## 2017-03-23 HISTORY — DX: Age-related osteoporosis without current pathological fracture: M81.0

## 2017-03-23 NOTE — Progress Notes (Signed)
  Radiation Oncology         [phone] ________________________________  Name: April [name] MRN: [id number]  Date: 03/21/2017  DOB: 06-28-1961  End of Treatment Note  Diagnosis:    Clinical stage IIIA (T2N2) grade 3 invasive ductal carcinoma of the UOQ right breast (ER+,PR+,HER2-) Stage IIIA (ympT1c, ypN2a) grade 2 invasive ductal carcinoma of the UOQ right breast (ER+,PR+,HER2-)  Indication for treatment:  Curative, post-op to reduce the risk of recurrence  Radiation treatment dates:   02/07/17 - 03/21/17  Site/dose:    1) Right breast and nodes - 4 field: 50 Gy in 25 fractions. IM NODES: >95% receive at least 45Gy/67f. 50Gy to SCLV/PAB @ 2Gy /fraction x 25 fractions. 2) Right breast boost: 10 Gy in 5 fractions  Beams/energy:    1) 3D // 10X, 6X Photon 2) Isodose Plan // 10X, 15X, 6 X Photon  Narrative: The patient tolerated radiation treatment relatively well. The right breast was erythematous with superficial peeling of the right axilla and IM fold. She as advised to use Neosporin in the areas of peeling. The patient had fatigue, headaches for which she took motrin, seasonal allergies, and some pain underneath the right breast.  Plan: The patient has completed radiation treatment. The patient will return to radiation oncology clinic for routine followup in one month. I advised them to call or return sooner if they have any questions or concerns related to their recovery or treatment.  -----------------------------------  SEppie Gibson MD  This document serves as a record of services personally performed by SEppie Gibson MD. It was created on her behalf by JDarcus Austin a trained medical scribe. The creation of this record is based on the scribe's personal observations and the provider's statements to them. This document has been checked and approved by the attending provider.

## 2017-03-27 ENCOUNTER — Encounter: Payer: Self-pay | Admitting: *Deleted

## 2017-03-27 DIAGNOSIS — Z170 Estrogen receptor positive status [ER+]: Principal | ICD-10-CM

## 2017-03-27 DIAGNOSIS — C50411 Malignant neoplasm of upper-outer quadrant of right female breast: Secondary | ICD-10-CM

## 2017-03-28 ENCOUNTER — Other Ambulatory Visit: Payer: Self-pay | Admitting: Oncology

## 2017-03-28 NOTE — Progress Notes (Unsigned)
[name] referral called to referral line 03/28/2017

## 2017-04-13 ENCOUNTER — Encounter: Payer: Self-pay | Admitting: Radiation Oncology

## 2017-04-19 ENCOUNTER — Telehealth: Payer: Self-pay | Admitting: *Deleted

## 2017-04-19 ENCOUNTER — Other Ambulatory Visit: Payer: Self-pay | Admitting: *Deleted

## 2017-04-19 ENCOUNTER — Encounter: Payer: Self-pay | Admitting: *Deleted

## 2017-04-19 DIAGNOSIS — C773 Secondary and unspecified malignant neoplasm of axilla and upper limb lymph nodes: Principal | ICD-10-CM

## 2017-04-19 DIAGNOSIS — C50911 Malignant neoplasm of unspecified site of right female breast: Secondary | ICD-10-CM

## 2017-04-19 NOTE — Telephone Encounter (Signed)
error 

## 2017-04-19 NOTE — Telephone Encounter (Signed)
This RN contacted pt per need to obtain follow scans for new baseline prior to going on [name] study. Per discussion April [name] understands need for scan due to areas noted on prior studies.  No other needs or questions at this time.

## 2017-04-19 NOTE — Progress Notes (Addendum)
Spoke with Dr. Jana Hakim regarding CT scan of 07/11/16 and radiologist suggestion to f/u with scan chest and abdomen. MD agrees this needs to occur. Orders placed as requested and notified collaborative nurse to call patient to inform her. Will schedule her lab for the CT scan for 5/18 after she sees Dr. Isidore Moos. Notified Marlon Pel, RN of MD orders and she will call patient today.

## 2017-04-20 ENCOUNTER — Other Ambulatory Visit (HOSPITAL_BASED_OUTPATIENT_CLINIC_OR_DEPARTMENT_OTHER): Payer: Medicaid Other

## 2017-04-20 ENCOUNTER — Encounter: Payer: Self-pay | Admitting: Radiation Oncology

## 2017-04-20 ENCOUNTER — Ambulatory Visit
Admission: RE | Admit: 2017-04-20 | Discharge: 2017-04-20 | Disposition: A | Discharge status: Home / Self Care | Payer: Medicaid Other | Source: Ambulatory Visit | Attending: Radiation Oncology | Admitting: Radiation Oncology

## 2017-04-20 VITALS — BP 148/87 | HR 85 | Temp 98.2°F | Ht 64.0 in | Wt 230.6 lb

## 2017-04-20 DIAGNOSIS — C50411 Malignant neoplasm of upper-outer quadrant of right female breast: Secondary | ICD-10-CM | POA: Diagnosis present

## 2017-04-20 DIAGNOSIS — Z08 Encounter for follow-up examination after completed treatment for malignant neoplasm: Secondary | ICD-10-CM | POA: Diagnosis present

## 2017-04-20 DIAGNOSIS — C50911 Malignant neoplasm of unspecified site of right female breast: Secondary | ICD-10-CM

## 2017-04-20 DIAGNOSIS — Z170 Estrogen receptor positive status [ER+]: Secondary | ICD-10-CM | POA: Diagnosis not present

## 2017-04-20 DIAGNOSIS — Z923 Personal history of irradiation: Secondary | ICD-10-CM | POA: Diagnosis not present

## 2017-04-20 DIAGNOSIS — Z79899 Other long term (current) drug therapy: Secondary | ICD-10-CM | POA: Insufficient documentation

## 2017-04-20 DIAGNOSIS — C773 Secondary and unspecified malignant neoplasm of axilla and upper limb lymph nodes: Principal | ICD-10-CM

## 2017-04-20 DIAGNOSIS — Z79811 Long term (current) use of aromatase inhibitors: Secondary | ICD-10-CM | POA: Diagnosis not present

## 2017-04-20 HISTORY — DX: Personal history of irradiation: Z92.3

## 2017-04-20 HISTORY — DX: Age-related osteoporosis without current pathological fracture: M81.0

## 2017-04-20 LAB — BASIC METABOLIC PANEL
ANION GAP: 9 meq/L (ref 3–11)
BUN: 9.1 mg/dL (ref 7.0–26.0)
CALCIUM: 10 mg/dL (ref 8.4–10.4)
CO2: 25 mEq/L (ref 22–29)
CREATININE: 0.8 mg/dL (ref 0.6–1.1)
Chloride: 105 mEq/L (ref 98–109)
EGFR: 78 mL/min/{1.73_m2} — AB (ref >90)
GLUCOSE: 101 mg/dL (ref 70–140)
Potassium: 3.8 mEq/L (ref 3.5–5.1)
Sodium: 140 mEq/L (ref 136–145)

## 2017-04-20 NOTE — Progress Notes (Signed)
[name] is here for follow up of radiation completed 03/21/17 to her Right Breast. She reports pain to her arms and legs/ feet. She is taking anastrozole. She will see Dr. Jana Hakim next 05/07/17. She will participate in the [name] trial. She has a survivorship appointment on 07/23/17. Her Right Breast has healed. It is hyperpigmented. She has scarring to her Right Axilla area. She is using Radiaplex to her Right Breast area and Right upper posterior back, and biafine to her midchest area. She will change to a vitamin E cream when completed.   BP (!) 148/87   Pulse 85   Temp 98.2 F (36.8 C)   Ht 5\' 4"  (1.626 m)   Wt 230 lb 9.6 oz (104.6 kg)   SpO2 99% Comment: room air  BMI 39.58 kg/m    Wt Readings from Last 3 Encounters:  04/20/17 230 lb 9.6 oz (104.6 kg)  03/20/17 231 lb 6.4 oz (105 kg)  03/05/17 238 lb (108 kg)

## 2017-04-20 NOTE — Addendum Note (Signed)
Encounter addended by: Alex Leahy, [name], RN on: 04/20/2017  4:41 PM<BR>    Actions taken: Charge Capture section accepted

## 2017-04-20 NOTE — Progress Notes (Signed)
Radiation Oncology         [phone] ________________________________  Name: April [name] MRN: [id number]  Date: 04/20/2017  DOB: 1961/01/21  Follow-Up Visit Note  Outpatient  CC: April Hook, MD  April [name], April Dad, MD  Diagnosis and Prior Radiotherapy:    ICD-9-CM ICD-10-CM   1. 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 Right Breast boosted an additional 10 Gy in 5 fractions.  CHIEF COMPLAINT: Here for follow-up and surveillance of right breast cancer  Narrative:  The patient returns today for routine follow-up of radiation completed 03/21/17.  On review of systems, the patient reports distal extremity pain r/t neuropathy. She is using Radiaplex to her right breast and right upper back, as well as Biafine to her mid chest. She reports ongoing, manageable hot flashes.    She is currently taking Anastrozole without concern. She will follow up with Dr. Jana [name] on 05/07/17. She plans to participate in the [name] trial. She has a Survivorship appointment on 07/23/17.   ALLERGIES:  is allergic to hydrocodone; ultram [tramadol hcl]; and gabapentin.  Meds: Current Outpatient Prescriptions  Medication Sig Dispense Refill  . anastrozole (ARIMIDEX) 1 MG tablet Take 1 tablet (1 mg total) by mouth daily. 90 tablet 4  . b complex vitamins tablet Take 1 tablet by mouth daily.    . Cholecalciferol (VITAMIN D PO) Take by mouth.    . DULoxetine (CYMBALTA) 30 MG capsule 1 cap by mouth with 60  mg cap once daily for total of 90 mg daily (Patient taking differently: Take 30 mg by mouth See admin instructions. Take 1  capsule by mouth daily along with 60  mg cap once daily for total of 90 mg daily) 30 capsule 11  . DULoxetine (CYMBALTA) 60 MG capsule Take 1 capsule (60 mg total) by mouth daily. Take along with '30mg'$  capsule for a total of '90mg'$  of cymbalta per day. 30 capsule 11  . ibuprofen (ADVIL,MOTRIN) 800 MG tablet TAKE 1 TABLET BY MOUTH EVERY 8 HOURS AS NEEDED. 30 tablet 0  . mometasone (NASONEX) 50 MCG/ACT nasal spray 2 sprays each nostril daily 17 g 11  . ranitidine (ZANTAC) 75 MG tablet Take 75 mg by mouth 2 (two) times daily.     No current facility-administered medications for this encounter.     Physical Findings: The patient is in no acute distress. Patient is alert and oriented.  height is '5\' 4"'$  (1.626 m) and weight is 230 lb 9.6 oz (104.6 kg). Her temperature is 98.2 F (36.8 C). Her blood pressure is 148/87 (abnormal) and her pulse is 85. Her oxygen saturation is 99%.    Breast is slightly erythematous and shows a little bit of hyperpigmentation in right axilla. Skin is intact, and has healed very well overall.   Lab Findings: Lab Results  Component Value Date   WBC 1.2 (L) 11/15/2016   HGB 12.8 12/19/2016   HCT 32.9 (L) 11/15/2016   MCV 97.4 11/15/2016   PLT 247 11/15/2016    Radiographic Findings: Dg Bone Density (dxa)  Result Date: 03/23/2017 EXAM: DUAL X-RAY ABSORPTIOMETRY (DXA) FOR BONE MINERAL DENSITY IMPRESSION:  Referring Physician:  Chauncey [name] PATIENT: Name: April [name] Patient ID: [id number] Birth Date: 03-Jan-1961 Height: 64.0 in. Sex: Female Measured: 03/23/2017 Weight: 228.2 lbs. Indications: Breast Cancer History, Caucasian, Estrogen Deficient, Postmenopausal Fractures: Treatments: Calcium (E943.0), Vitamin D (E933.5) ASSESSMENT: The BMD measured at AP Spine L1-L4 is 0.877 g/cm2 with a T-score of -2.6. This patient's diagnostic category is considered OSTEOPOROSIS according to [facility]) criteria. Site Region Measured Date Measured Age YA BMD  Significant CHANGE T-score AP Spine  L1-L4      4/23/2017    54.9         -2.6    0.877 g/cm2 DualFemur Neck Right 03/23/2017    54.9         -1.7    0.807 g/cm2 [employer] [facility]) criteria for post-menopausal, Caucasian Women: Normal       T-score at or above -1 SD Osteopenia   T-score between -1 and -2.5 SD Osteoporosis T-score at or below -2.5 SD RECOMMENDATION: [place] recommends that FDA-approved medical therapies be considered in postmenopausal women and men age 30 or older with a: 1. Hip or vertebral (clinical or morphometric) fracture. 2. T-score of <-2.5 at the spine or hip. 3. Ten-year fracture probability by FRAX of 3% or greater for hip fracture or 20% or greater for major osteoporotic fracture. All treatment decisions require clinical judgment and consideration of individual patient factors, including patient preferences, co-morbidities, previous drug use, risk factors not captured in the FRAX model (e.g. falls, vitamin D deficiency, increased bone turnover, interval significant decline in bone density) and possible under - or over-estimation of fracture risk by FRAX. All patients should ensure an adequate intake of dietary calcium (1200 mg/d) and vitamin D (800 IU daily) unless contraindicated. FOLLOW-UP: People with diagnosed cases of osteoporosis or at high risk for fracture should have regular bone mineral density tests. For patients eligible for Medicare, routine testing is allowed once every 2 years. The testing frequency can be increased to one year for patients who have rapidly progressing disease, those who are receiving or discontinuing medical therapy to restore bone mass, or have additional risk factors. I have reviewed this report, and agree with the above findings. [facility] Radiology Electronically Signed   By: Margarette Canada M.D.   On: 03/23/2017 14:54    Impression/Plan:  Healing well.  Patient will use Vitamin E lotion to the skin of the  treatment area 1-2 times daily for 2-3 months.  She will continue to follow with medical oncology as indicated. She will continue with routine yearly mammograms. The patient will follow up with me prn.      [name], MD  This document serves as a record of services personally performed by [name], MD. It was created on her behalf by Maryla Morrow, a trained medical scribe. The creation of this record is based on the scribe's personal observations and the provider's statements to them. This document has been checked and approved by the attending provider.

## 2017-04-24 ENCOUNTER — Other Ambulatory Visit: Payer: Self-pay | Admitting: Oncology

## 2017-04-25 ENCOUNTER — Other Ambulatory Visit: Payer: Self-pay | Admitting: Oncology

## 2017-04-25 ENCOUNTER — Other Ambulatory Visit: Payer: Self-pay | Admitting: *Deleted

## 2017-04-25 DIAGNOSIS — C773 Secondary and unspecified malignant neoplasm of axilla and upper limb lymph nodes: Principal | ICD-10-CM

## 2017-04-25 DIAGNOSIS — R9389 Abnormal findings on diagnostic imaging of other specified body structures: Secondary | ICD-10-CM

## 2017-04-25 DIAGNOSIS — Z170 Estrogen receptor positive status [ER+]: Secondary | ICD-10-CM

## 2017-04-25 DIAGNOSIS — C50411 Malignant neoplasm of upper-outer quadrant of right female breast: Secondary | ICD-10-CM

## 2017-04-25 DIAGNOSIS — C50911 Malignant neoplasm of unspecified site of right female breast: Secondary | ICD-10-CM

## 2017-05-02 ENCOUNTER — Ambulatory Visit (HOSPITAL_COMMUNITY): Payer: Medicaid Other

## 2017-05-04 ENCOUNTER — Encounter (HOSPITAL_COMMUNITY): Payer: Self-pay

## 2017-05-04 ENCOUNTER — Ambulatory Visit (HOSPITAL_COMMUNITY)
Admission: RE | Admit: 2017-05-04 | Discharge: 2017-05-04 | Disposition: A | Discharge status: Home / Self Care | Payer: Medicaid Other | Source: Ambulatory Visit | Attending: Oncology | Admitting: Oncology

## 2017-05-04 DIAGNOSIS — R918 Other nonspecific abnormal finding of lung field: Secondary | ICD-10-CM | POA: Diagnosis not present

## 2017-05-04 DIAGNOSIS — C50911 Malignant neoplasm of unspecified site of right female breast: Secondary | ICD-10-CM | POA: Diagnosis not present

## 2017-05-04 DIAGNOSIS — C50411 Malignant neoplasm of upper-outer quadrant of right female breast: Secondary | ICD-10-CM | POA: Diagnosis not present

## 2017-05-04 DIAGNOSIS — R9389 Abnormal findings on diagnostic imaging of other specified body structures: Secondary | ICD-10-CM

## 2017-05-04 DIAGNOSIS — N6489 Other specified disorders of breast: Secondary | ICD-10-CM | POA: Insufficient documentation

## 2017-05-04 DIAGNOSIS — R938 Abnormal findings on diagnostic imaging of other specified body structures: Secondary | ICD-10-CM | POA: Insufficient documentation

## 2017-05-04 DIAGNOSIS — C773 Secondary and unspecified malignant neoplasm of axilla and upper limb lymph nodes: Secondary | ICD-10-CM | POA: Diagnosis not present

## 2017-05-04 DIAGNOSIS — K7689 Other specified diseases of liver: Secondary | ICD-10-CM | POA: Insufficient documentation

## 2017-05-04 DIAGNOSIS — Z170 Estrogen receptor positive status [ER+]: Secondary | ICD-10-CM | POA: Diagnosis not present

## 2017-05-04 MED ORDER — IOPAMIDOL (ISOVUE-300) INJECTION 61%
100.0000 mL | Freq: Once | INTRAVENOUS | Status: AC | PRN
Start: 2017-05-04 — End: 2017-05-04
  Administered 2017-05-04: 100 mL via INTRAVENOUS

## 2017-05-04 MED ORDER — IOPAMIDOL (ISOVUE-300) INJECTION 61%
INTRAVENOUS | Status: AC
  Filled 2017-05-04: qty 100

## 2017-05-04 NOTE — Addendum Note (Signed)
Addendum  created 05/04/17 [id number] by Effie Berkshire, MD   Sign clinical note

## 2017-05-07 ENCOUNTER — Ambulatory Visit: Payer: Medicaid Other | Admitting: Oncology

## 2017-05-07 ENCOUNTER — Other Ambulatory Visit: Payer: Medicaid Other

## 2017-05-07 ENCOUNTER — Encounter: Payer: Medicaid Other | Admitting: *Deleted

## 2017-05-17 ENCOUNTER — Other Ambulatory Visit: Payer: Medicaid Other

## 2017-05-17 ENCOUNTER — Telehealth: Payer: Self-pay

## 2017-05-17 ENCOUNTER — Ambulatory Visit: Payer: Medicaid Other | Admitting: Oncology

## 2017-05-17 ENCOUNTER — Encounter: Payer: Medicaid Other | Admitting: *Deleted

## 2017-05-17 NOTE — Telephone Encounter (Signed)
Pt called about her CT report CAP of 6/1. She is asking if her appt 6/27 is appropriate for follow up.  S/w Dr Jana Hakim and called pt back that 6/27 is appropriate. There is nothing on CT that requires immediate attention.

## 2017-05-30 ENCOUNTER — Encounter: Payer: Self-pay | Admitting: *Deleted

## 2017-05-30 ENCOUNTER — Encounter: Payer: Self-pay | Admitting: Oncology

## 2017-05-30 ENCOUNTER — Encounter: Payer: Medicaid Other | Admitting: *Deleted

## 2017-05-30 ENCOUNTER — Other Ambulatory Visit (HOSPITAL_BASED_OUTPATIENT_CLINIC_OR_DEPARTMENT_OTHER): Payer: Medicaid Other

## 2017-05-30 ENCOUNTER — Ambulatory Visit (HOSPITAL_BASED_OUTPATIENT_CLINIC_OR_DEPARTMENT_OTHER): Payer: Medicaid Other | Admitting: Oncology

## 2017-05-30 VITALS — BP 138/77 | HR 84 | Temp 97.9°F | Resp 18 | Ht 64.0 in | Wt 231.9 lb

## 2017-05-30 DIAGNOSIS — Z170 Estrogen receptor positive status [ER+]: Principal | ICD-10-CM

## 2017-05-30 DIAGNOSIS — C773 Secondary and unspecified malignant neoplasm of axilla and upper limb lymph nodes: Secondary | ICD-10-CM

## 2017-05-30 DIAGNOSIS — M810 Age-related osteoporosis without current pathological fracture: Secondary | ICD-10-CM

## 2017-05-30 DIAGNOSIS — Z79811 Long term (current) use of aromatase inhibitors: Secondary | ICD-10-CM

## 2017-05-30 DIAGNOSIS — C50911 Malignant neoplasm of unspecified site of right female breast: Secondary | ICD-10-CM

## 2017-05-30 DIAGNOSIS — C50411 Malignant neoplasm of upper-outer quadrant of right female breast: Secondary | ICD-10-CM

## 2017-05-30 LAB — CBC WITH DIFFERENTIAL/PLATELET
BASO%: 0.4 % (ref 0.0–2.0)
Basophils Absolute: 0 10*3/uL (ref 0.0–0.1)
EOS ABS: 0.5 10*3/uL (ref 0.0–0.5)
EOS%: 8.7 % — ABNORMAL HIGH (ref 0.0–7.0)
HCT: 40.9 % (ref 34.8–46.6)
HGB: 13.9 g/dL (ref 11.6–15.9)
LYMPH%: 9.8 % — AB (ref 14.0–49.7)
MCH: 32.5 pg (ref 25.1–34.0)
MCHC: 34 g/dL (ref 31.5–36.0)
MCV: 95.5 fL (ref 79.5–101.0)
MONO#: 0.4 10*3/uL (ref 0.1–0.9)
MONO%: 8.5 % (ref 0.0–14.0)
NEUT#: 3.8 10*3/uL (ref 1.5–6.5)
NEUT%: 72.6 % (ref 38.4–76.8)
PLATELETS: 290 10*3/uL (ref 145–400)
RBC: 4.28 10*6/uL (ref 3.70–5.45)
RDW: 15.6 % — ABNORMAL HIGH (ref 11.2–14.5)
WBC: 5.2 10*3/uL (ref 3.9–10.3)
lymph#: 0.5 10*3/uL — ABNORMAL LOW (ref 0.9–3.3)

## 2017-05-30 LAB — HEMOGLOBIN A1C
ESTIMATED AVERAGE GLUCOSE: 105 mg/dL
Hemoglobin A1c: 5.3 % (ref 4.8–5.6)

## 2017-05-30 LAB — COMPREHENSIVE METABOLIC PANEL
ALT: 24 U/L (ref 0–55)
ANION GAP: 10 meq/L (ref 3–11)
AST: 22 U/L (ref 5–34)
Albumin: 3.6 g/dL (ref 3.5–5.0)
Alkaline Phosphatase: 142 U/L (ref 40–150)
BILIRUBIN TOTAL: 0.44 mg/dL (ref 0.20–1.20)
BUN: 11.9 mg/dL (ref 7.0–26.0)
CHLORIDE: 103 meq/L (ref 98–109)
CO2: 26 meq/L (ref 22–29)
Calcium: 9.8 mg/dL (ref 8.4–10.4)
Creatinine: 0.8 mg/dL (ref 0.6–1.1)
EGFR: 81 mL/min/{1.73_m2} — AB (ref >90)
Glucose: 97 mg/dl (ref 70–140)
POTASSIUM: 4.3 meq/L (ref 3.5–5.1)
Sodium: 139 mEq/L (ref 136–145)
Total Protein: 7.3 g/dL (ref 6.4–8.3)

## 2017-05-30 MED ORDER — VITAMIN E EX CREA: 1.0000 "application " | TOPICAL_CREAM | Freq: Every day | CUTANEOUS | 0 refills | Status: DC

## 2017-05-30 NOTE — Progress Notes (Addendum)
[name]-Screening visit: Patient was seen and examined by Dr. Jana Hakim today after signing informed consent. Medical history reviewed and updated. She provided approximate start dates of her medications and asked Dr. Jana Hakim for refill on her acyclovir for her genital herpes of which she will have small outbreak monthly unless taking the preventative medication. Reports her outbreaks are mild (grade 1). Has occasional migraine headaches as well, that she only medicates if it lasts more than a couple days-reports headaches several times/month. Fioricet has worked well for her in the past and she requested refill from MD as well (only has #3 tablets on hand). She reports fatigue (grade 1), and is able to garden and do yardwork as well as walk 1-2 miles without stopping or dypspnea. 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 Isidore Moos daily; seasonal allergies and constant itching of her skin to point she will scratch and make a sore (since 2013) and reports this as mild and tolerable with meds. Stress urinary incontinence persists since noted on 07/14/16. Diagnosed with osteoporosis since 03/23/17 and is taking Vitamin D 800 units daily. Dr. Jana Hakim suggested speaking with her PCP regarding use of bisphosphonate in future after she has her dental work completed (reports to MD she has a tooth that may need to be pulled). Her GERD is well controlled with Zantac daily (grade 1). She reports occasional shoulder and back pain with prolonged activity that resolves w/rest. Has some residual intermittent burning sensation in her toes since chemotherapy (grade 1). H/O bilateral fractures in ankles resolve with brace/boot, but she will on occasion have pain and swelling in ankles L > R that is mild in nature. Research  nurse reminded her that she is due her routine mammogram in July and she is aware of this and plans to call to schedule this herself (received reminder letter). She started her anastrozole on May 1st with no adverse effect thus far. Patient was taken to speak with financial advocate for advise on how to proceed with continuation of her Medicaid benefits which may expire in July. She has been approved to begin disability income in December 2018. She will also discuss her ability to apply for the orange card again through [facility].  Adverse Event Log--BASELINE  Study/Protocol: [name]  Event Grade Onset Date Resolved Date Treatment Comments  Vulval infection-genital herpes-  1     2005   Acyclovir  Intermittent outbreaks- monthly  Headache-migrane     1   2013   Fioricet Headache- several/month  Fatigue   1   2013     Hot flashes   1   2012     Anxiety   1   2013  Cymbalta   Depression   1   2013  Cymbalta   Arthralgia-knees and shoulders  1   2014  Rest/Ibuprofen Due to arthritis  Dry skin with itching   2   2013   Claritin   Skin hyperpigmentation- Right breast  1       Vitamin E lotion Since radiation tx  Seasonal allergies   1 07/28/2012  Claritin    Osteoporosis   1 03/23/2017  Vitamin D T-score -2.5  Stress urinary incontinence  1 07/14/16     GERD   1   2013  Zantac   Paresthesia-toes   1  03/20/2017   Intermittent burning  Back pain   1   2014  Rest/ibuprofen Intermittent w/prolonged activity  Elevated BP   2 04/2016     Breast swelling-R., int.   1 05/30/17   Radiation changes-added on 07/11/17  Myalgias "all over"-int. 2 2013    Occasional ibuprofen R/T fibromyalgia   Manuela Schwartz L. Michaelle Copas, Therapist, sports, Product/process development scientist

## 2017-05-30 NOTE — Progress Notes (Signed)
Pt came in w/ concerns about her Medicaid because it's going to expire at the end of the month.  She worked w/ Information systems manager in the past so I called Altha Harm to inquire about pt's Medicaid.  She stated [place] submitted the renewal to DSS already.  Pt was relieved and very appreciative.
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
## 2017-05-31 ENCOUNTER — Other Ambulatory Visit: Payer: Self-pay | Admitting: *Deleted

## 2017-05-31 DIAGNOSIS — Z170 Estrogen receptor positive status [ER+]: Principal | ICD-10-CM

## 2017-05-31 DIAGNOSIS — A6000 Herpesviral infection of urogenital system, unspecified: Secondary | ICD-10-CM

## 2017-05-31 DIAGNOSIS — C50411 Malignant neoplasm of upper-outer quadrant of right female breast: Secondary | ICD-10-CM

## 2017-05-31 IMAGING — MR MR BILATERAL BREAST WITHOUT AND WITH CONTRAST
7 of 12 series · 25 of 48 positions shown · IV contrast (Multihance 20 ML)
Comparison: Bilateral breast MRI 07/14/2016 (pre chemotherapy).

ADDENDUM:
Corrections to the report as follows:

In the Findings section, for both the Right Breast and the Lymph
Nodes, the first sentence should read: Excellent response to
NEOADJUVANT chemotherapy...
In the Impression section, the final sentence in #1 should read:
Residual enhancement at the site of the mass measures approximately
2.4 x 0.9 x 1.1 cm.
CLINICAL DATA: 55-year-old with biopsy-proven grade 3 invasive
ductal carcinoma involving the upper outer quadrant of the right
breast at posterior depth and metastatic right axillary lymph nodes
in June 2016. Patient recently completed neoadjuvant chemotherapy.
MRI is requested to evaluate response to treatment.
LABS:  Not applicable.
EXAM:
BILATERAL BREAST MRI WITH AND WITHOUT CONTRAST
TECHNIQUE: Multiplanar, multisequence MR images of both breasts were obtained
prior to and following the intravenous administration of 20 ml of
MultiHance.

[Series 2: STIR · axial · 3.0mm · 0.94mm/px · 1 of 70 slices shown]
[im 1/70]
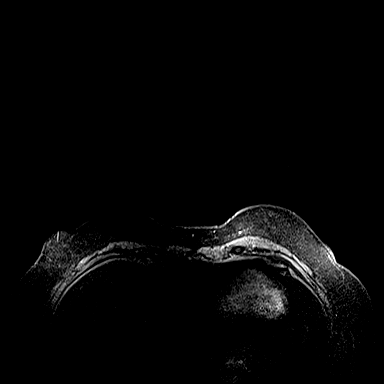

[Series 3: fl3d pre no · axial · non-contrast · 1.0mm · 0.80mm/px · z∈[-91,+115]mm · 5 of 208 slices shown]
[im 1/208]
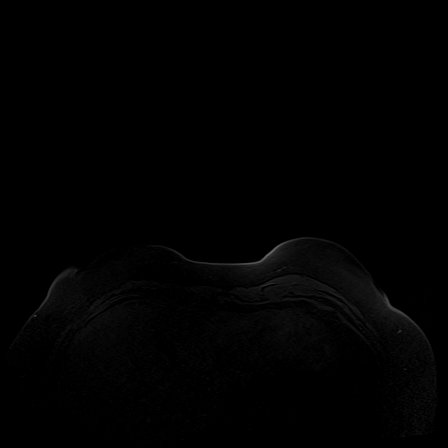
[im 52/208]
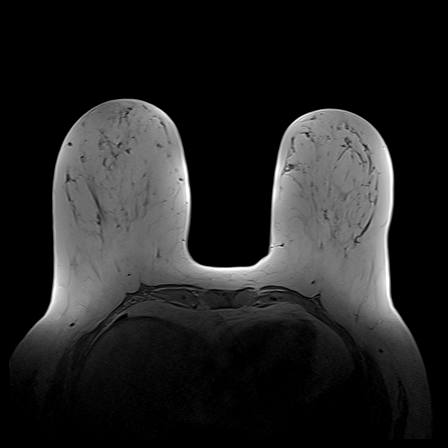
[im 104/208]
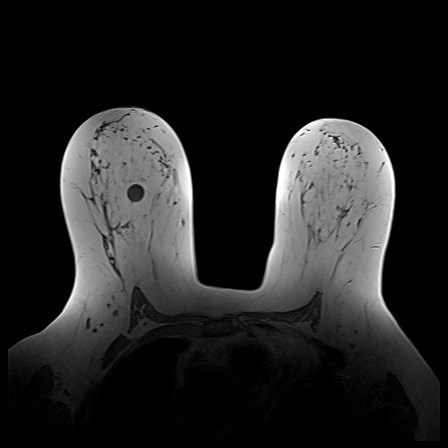
[im 156/208]
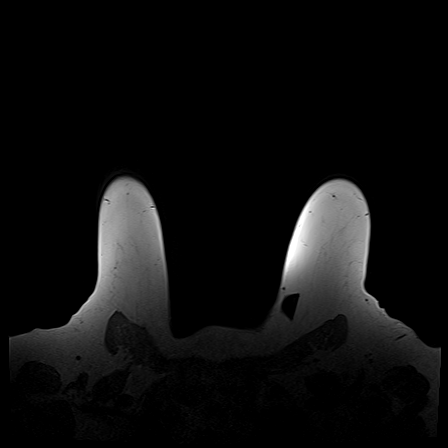
[im 208/208]
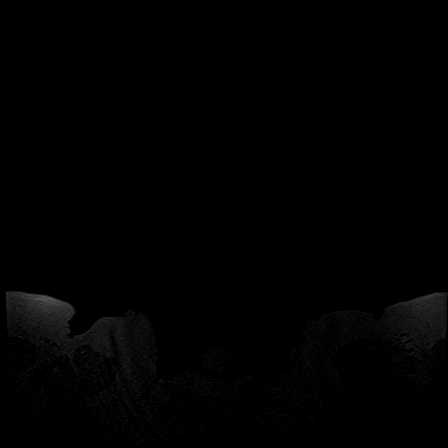

[Series 4: axial pre fs · axial · non-contrast · 1.0mm · 0.80mm/px · z∈[-91,+115]mm · 5 of 208 slices shown]
[im 1/208]
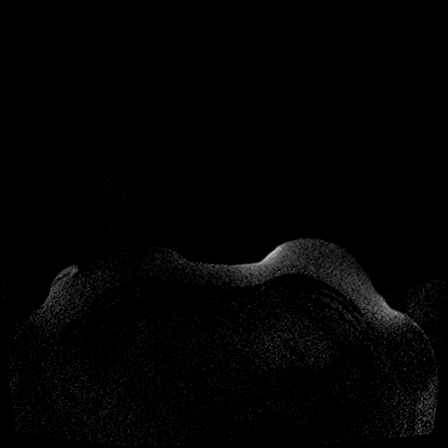
[im 52/208]
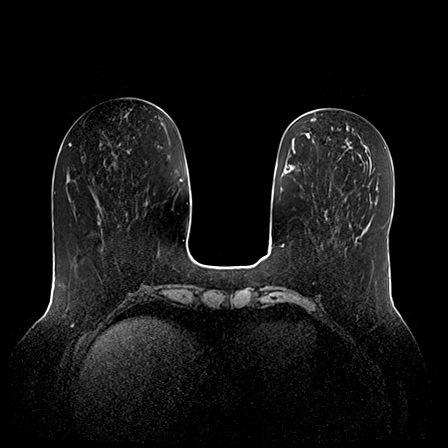
[im 104/208]
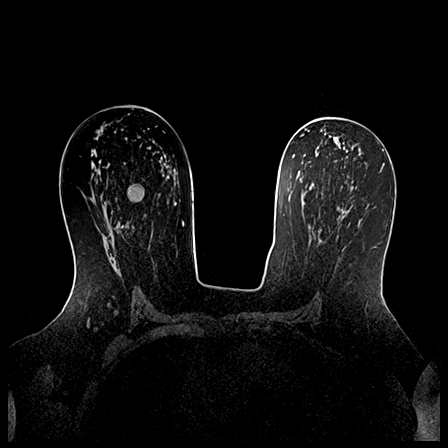
[im 156/208]
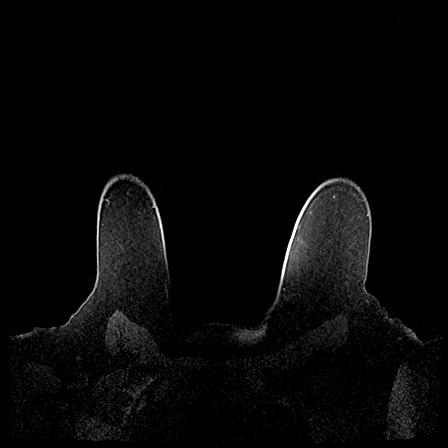
[im 208/208]
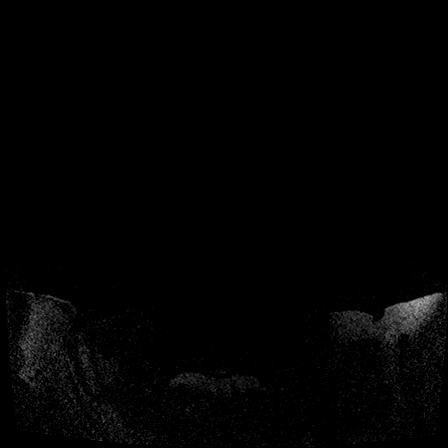

[Series 5: axial post 20 · axial · 1.0mm · 0.80mm/px · z∈[-91,+115]mm · 5 of 208 slices shown (1 of 3)]
[im 1/208]
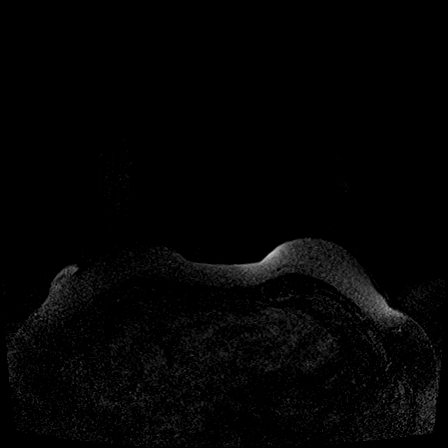
[im 52/208]
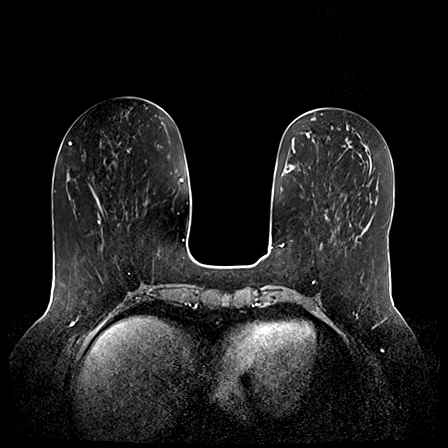
[im 104/208]
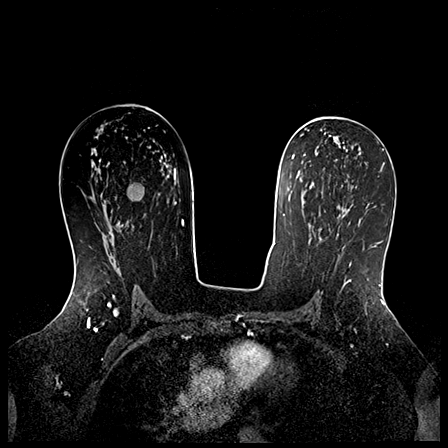
[im 156/208]
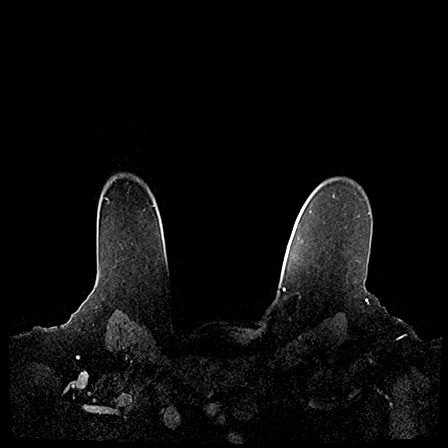
[im 208/208]
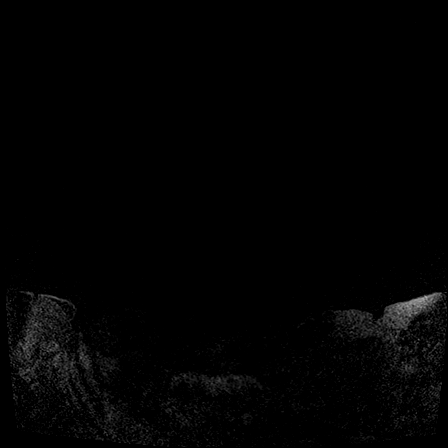

[Series 6: axial post 20 · axial · 1.0mm · 0.80mm/px · z∈[-91,+115]mm · 5 of 208 slices shown (2 of 3)]
[im 1/208]
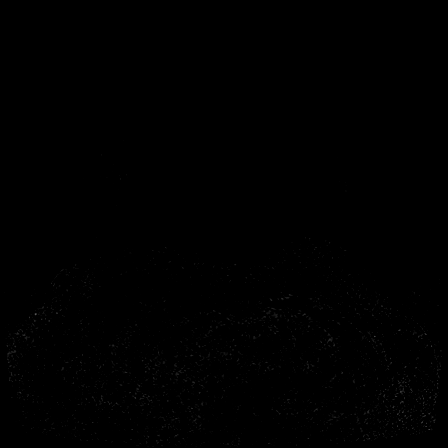
[im 52/208]
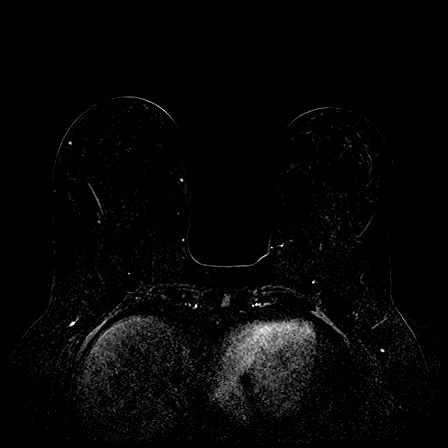
[im 104/208]
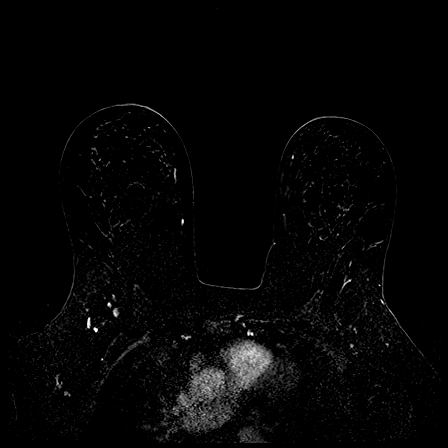
[im 156/208]
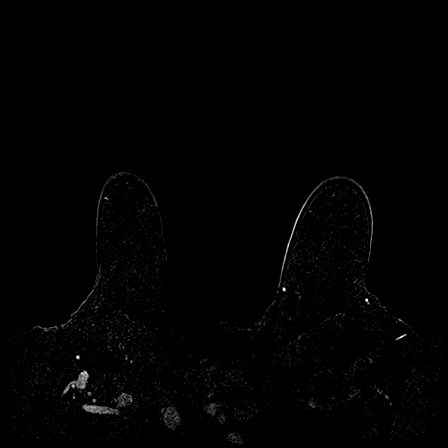
[im 208/208]
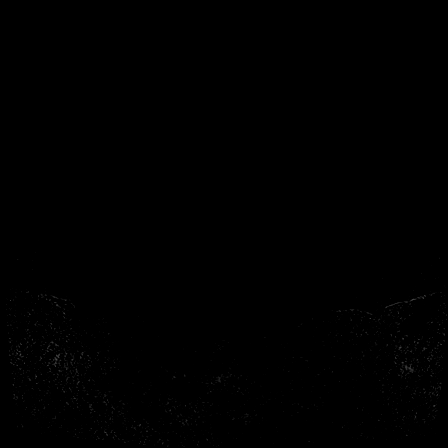

[Series 7: axial post 20 · axial · 208.0mm · 0.80mm/px · 1 of 1 slices shown (3 of 3)]
[im 1/1]
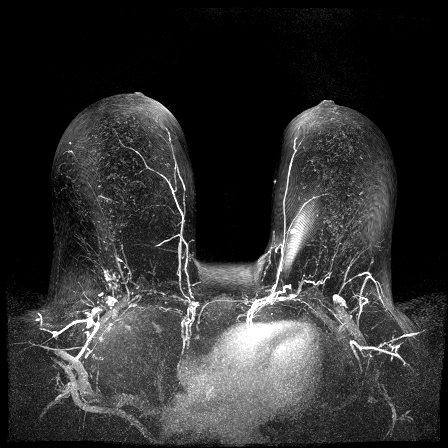

[Series 8: axial post 3 · axial · 1.0mm · 0.80mm/px · z∈[-91,-10]mm · 3 of 208 slices shown]
[im 1/208]
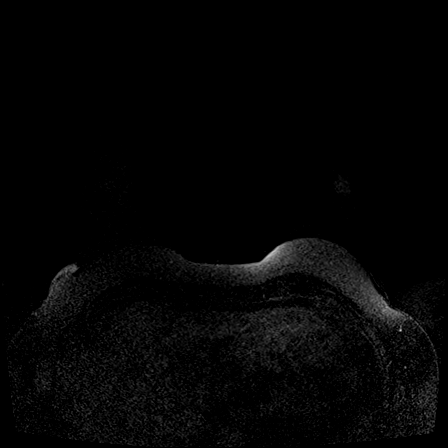
[im 42/208]
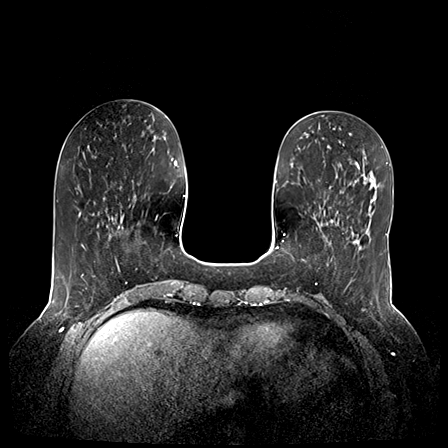
[im 83/208]
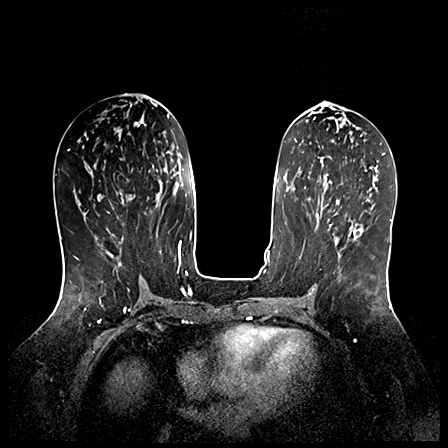

[25 of 48 positions shown; findings below may reference images not displayed]

THREE-DIMENSIONAL MR IMAGE RENDERING ON INDEPENDENT WORKSTATION:

Three-dimensional MR images were rendered by post-processing of the
original MR data on an independent workstation. The
three-dimensional MR images were interpreted, and findings are
reported in the following complete MRI report for this study. Three
dimensional images were evaluated at the independent [employer]
workstation.
Mammography 06/21/2016 (right), 06/15/2016 (bilateral) and earlier.
Right breast ultrasound 06/21/2016, 06/15/2016.
FINDINGS: Breast composition: b. Scattered fibroglandular tissue.

Background parenchymal enhancement: Minimal.

Right breast: Excellent imaging response to adjuvant chemotherapy
with marked reduction in the volume of the mass in the upper outer
quadrant of the right breast at posterior depth, residual
enhancement measuring approximately 2.4 x 0.9 x 1.1 cm (AP X
transverse X craniocaudal), previously 4.3 x 2.7 x 4.1 cm. Stable
benign oil cysts as noted previously. No new abnormal enhancement.

Left breast: Stable benign oil cysts as noted previously. No
abnormal enhancement. Port-A-Cath port is present in the far upper
inner left breast.

Lymph nodes: Excellent imaging response to adjuvant chemotherapy as
the previously identified pathologic level 1 right axillary lymph
nodes are now all normal in size. The most anterior and superior
node, previously biopsied, adjacent to the residual enhancement at
the site of the mass, has a current measurement of 0.7 x 0.9 cm
(previously 1.5 x 2.0 cm).

Ancillary findings:  None.
IMPRESSION: 1. Excellent imaging response to neoadjuvant chemotherapy with
marked reduction in the volume of the mass in the upper outer
quadrant of the right breast at posterior depth. Residual
enhancement at the site of the mass measures approximately blank.
2. Excellent imaging response to neoadjuvant chemotherapy with the
previously identified multiple pathologic level 1 right axillary
lymph nodes now all normal in size.
3. Stable benign oil cysts in both breasts as identified previously.
4. No new abnormalities.

RECOMMENDATION:
Treatment plan.

BI-RADS CATEGORY  6: Known biopsy-proven malignancy.

## 2017-05-31 MED ORDER — VALACYCLOVIR HCL 1 G PO TABS: 500.0000 mg | ORAL_TABLET | Freq: Two times a day (BID) | ORAL | 6 refills | Status: DC

## 2017-05-31 MED ORDER — BUTALBITAL-APAP-CAFFEINE 50-325-40 MG PO TABS: ORAL_TABLET | ORAL | 0 refills | Status: DC

## 2017-06-03 ENCOUNTER — Other Ambulatory Visit: Payer: Self-pay | Admitting: Oncology

## 2017-06-05 ENCOUNTER — Other Ambulatory Visit: Payer: Self-pay | Admitting: General Surgery

## 2017-06-05 ENCOUNTER — Encounter: Payer: Medicaid Other | Admitting: *Deleted

## 2017-06-05 ENCOUNTER — Other Ambulatory Visit: Payer: Medicaid Other

## 2017-06-05 DIAGNOSIS — Z170 Estrogen receptor positive status [ER+]: Principal | ICD-10-CM

## 2017-06-05 DIAGNOSIS — Z9889 Other specified postprocedural states: Secondary | ICD-10-CM

## 2017-06-05 DIAGNOSIS — Z853 Personal history of malignant neoplasm of breast: Secondary | ICD-10-CM

## 2017-06-05 DIAGNOSIS — C50411 Malignant neoplasm of upper-outer quadrant of right female breast: Secondary | ICD-10-CM

## 2017-06-05 LAB — RESEARCH LABS

## 2017-06-05 NOTE — Progress Notes (Signed)
AFT-05 [name] Study Arm B- Endocrine Therapy. Cycle 1, Day 1 Visit Note: Patient was randomized in computer yesterday to Arm B- Endocrine Therapy.  Dr. Jana Hakim notified of study assignment.  Patient in clinic today to start cycle 1, day 1 of [name] study.  Upon arrival to clinic, patient given questionnaires for PROs to complete while waiting for lab appointment.  Patient had research blood drawn and then met with research nurse in [place] room to review study.  Patient finished her PROs and returned them to research nurse.  They were checked for completeness.  Informed patient she has been randomized to Arm B, Endocrine Therapy only.  She will not receive any additional medication and will continue to take Anastrozole daily as directed.  Patient states understanding and says she is ok with this outcome.  She reports she has been taking Anastrozole daily and denies any side effects.  Medication Diary for Anastrozole given to patient and instructions on completing daily starting today.  Instructed patient to bring diary back on next visit.  Patient verbalized understanding.  Screening assessments done < 7 days ago will serve as Cycle 1, Day 1 assessments and do not need to be repeated today per protocol.  Patient reviewed her current medication list and denies any changes.  She denies any new or worsening AEs since being seen by Dr. Jana Hakim last week.   Reviewed schedule for Arm B of study with patient.  Research nurse will call patient for Cycle 1, day 14 assessment on 06/19/17.  Patient is scheduled to return to clinic for Cycle 2, Day 1 visit on 07/03/17.  Gave patient a copy of schedule. Thanked patient for her time and participation in this clinical trial.  Instructed her to call Dr. Virgie Dad office for any new problems or concerns prior to next visit.   Patient verbalized understanding and denied any questions.  Foye Spurling, BSN, RN Clinical Research Nurse 06/05/2017 11:51 AM

## 2017-06-18 ENCOUNTER — Other Ambulatory Visit: Payer: Self-pay | Admitting: Internal Medicine

## 2017-06-18 ENCOUNTER — Other Ambulatory Visit: Payer: Medicaid Other

## 2017-06-18 ENCOUNTER — Other Ambulatory Visit: Payer: Self-pay

## 2017-06-18 ENCOUNTER — Ambulatory Visit
Admission: RE | Admit: 2017-06-18 | Discharge: 2017-06-18 | Disposition: A | Discharge status: Home / Self Care | Payer: Medicaid Other | Source: Ambulatory Visit | Attending: General Surgery | Admitting: General Surgery

## 2017-06-18 ENCOUNTER — Ambulatory Visit: Payer: Medicaid Other | Admitting: Oncology

## 2017-06-18 DIAGNOSIS — Z853 Personal history of malignant neoplasm of breast: Secondary | ICD-10-CM

## 2017-06-18 DIAGNOSIS — Z9889 Other specified postprocedural states: Secondary | ICD-10-CM

## 2017-06-18 HISTORY — DX: Personal history of antineoplastic chemotherapy: Z92.21

## 2017-06-18 HISTORY — DX: Personal history of irradiation: Z92.3

## 2017-06-18 MED ORDER — DULOXETINE HCL 60 MG PO CPEP: 60.0000 mg | ORAL_CAPSULE | Freq: Every day | ORAL | 1 refills | Status: DC

## 2017-06-19 ENCOUNTER — Encounter: Payer: Self-pay | Admitting: *Deleted

## 2017-06-19 ENCOUNTER — Other Ambulatory Visit: Payer: Medicaid Other

## 2017-06-19 ENCOUNTER — Encounter (HOSPITAL_COMMUNITY): Payer: Self-pay | Admitting: *Deleted

## 2017-06-19 ENCOUNTER — Telehealth: Payer: Self-pay | Admitting: *Deleted

## 2017-06-19 DIAGNOSIS — C50411 Malignant neoplasm of upper-outer quadrant of right female breast: Secondary | ICD-10-CM

## 2017-06-19 DIAGNOSIS — Z170 Estrogen receptor positive status [ER+]: Principal | ICD-10-CM

## 2017-06-19 NOTE — Telephone Encounter (Addendum)
06/19/2017 @ 1138: Attempted to reach patient to follow up on tolerance of anastrozole on [name] trial X 2. Phone rang several times then disconnected. No option to leave voice mail. Will send patient a Mychart message to request call to research nurse.  Mauri Reading Michaelle Copas, RN, BSN Clinical Research Nurse  06/19/17 @ 1708: No answer at home/mobile #, but was able to leave voice mail requesting return call tomorrow. Mauri Reading Michaelle Copas, Therapist, sports, Product/process development scientist

## 2017-06-20 ENCOUNTER — Telehealth: Payer: Self-pay | Admitting: *Deleted

## 2017-06-20 NOTE — Telephone Encounter (Signed)
06/20/2017 @ 0930 : Spoke with patient by phone today for Day 14 (Cycle 1) adverse events assessment. She reports no new symptoms since office visit on 06/05/2017. Tells research nurse "I'm doing great". She has no new medications to report and has had no visits with other providers. Confirmed she is aware of normal mammogram report. Has been taking her anastrozole daily and recording it in her medication diary. Reviewed her next appointment for 07/03/2017 at 1030 for lab and cycle #2 visit with NP. Also made aware of appointment change for survivorship visit to 07/31/17, which is when her Cycle #3 [place] visit it due and she agrees to this change.  Mauri Reading Michaelle Copas Therapist, sports, BSN Clinical Research Nurse 06/20/2017 @ 1005

## 2017-06-26 ENCOUNTER — Telehealth: Payer: Self-pay | Admitting: *Deleted

## 2017-06-26 NOTE — Telephone Encounter (Signed)
This RN spoke with the patient per her email to her research nurse - April [name]- stating concern due to new area of discomfort in her right inferior rib cage.  April [name] states pain has improved since sending email " so maybe I just pulled something ".  This RN discussed above as well as if pain returns an xray of her ribs/chest can be obtained.  April [name] verbalized understanding.  No further needs at this time.
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
## 2017-07-03 ENCOUNTER — Ambulatory Visit (HOSPITAL_BASED_OUTPATIENT_CLINIC_OR_DEPARTMENT_OTHER): Payer: Medicaid Other | Admitting: Adult Health

## 2017-07-03 ENCOUNTER — Encounter: Payer: Self-pay | Admitting: *Deleted

## 2017-07-03 ENCOUNTER — Other Ambulatory Visit (HOSPITAL_BASED_OUTPATIENT_CLINIC_OR_DEPARTMENT_OTHER): Payer: Medicaid Other

## 2017-07-03 ENCOUNTER — Ambulatory Visit (HOSPITAL_COMMUNITY)
Admission: RE | Admit: 2017-07-03 | Discharge: 2017-07-03 | Disposition: A | Discharge status: Home / Self Care | Payer: Medicaid Other | Source: Ambulatory Visit | Attending: Adult Health | Admitting: Adult Health

## 2017-07-03 VITALS — BP 122/78 | HR 82 | Temp 98.9°F | Resp 20 | Ht 64.0 in | Wt 229.5 lb

## 2017-07-03 DIAGNOSIS — M5442 Lumbago with sciatica, left side: Secondary | ICD-10-CM | POA: Insufficient documentation

## 2017-07-03 DIAGNOSIS — Z006 Encounter for examination for normal comparison and control in clinical research program: Secondary | ICD-10-CM

## 2017-07-03 DIAGNOSIS — Z170 Estrogen receptor positive status [ER+]: Secondary | ICD-10-CM | POA: Insufficient documentation

## 2017-07-03 DIAGNOSIS — M5441 Lumbago with sciatica, right side: Secondary | ICD-10-CM

## 2017-07-03 DIAGNOSIS — M8580 Other specified disorders of bone density and structure, unspecified site: Secondary | ICD-10-CM | POA: Diagnosis not present

## 2017-07-03 DIAGNOSIS — C50411 Malignant neoplasm of upper-outer quadrant of right female breast: Secondary | ICD-10-CM | POA: Diagnosis not present

## 2017-07-03 DIAGNOSIS — M4184 Other forms of scoliosis, thoracic region: Secondary | ICD-10-CM | POA: Insufficient documentation

## 2017-07-03 DIAGNOSIS — C773 Secondary and unspecified malignant neoplasm of axilla and upper limb lymph nodes: Secondary | ICD-10-CM

## 2017-07-03 DIAGNOSIS — Z79811 Long term (current) use of aromatase inhibitors: Secondary | ICD-10-CM

## 2017-07-03 DIAGNOSIS — M5136 Other intervertebral disc degeneration, lumbar region: Secondary | ICD-10-CM | POA: Insufficient documentation

## 2017-07-03 DIAGNOSIS — M810 Age-related osteoporosis without current pathological fracture: Secondary | ICD-10-CM | POA: Diagnosis not present

## 2017-07-03 DIAGNOSIS — M545 Low back pain: Secondary | ICD-10-CM

## 2017-07-03 DIAGNOSIS — C50911 Malignant neoplasm of unspecified site of right female breast: Secondary | ICD-10-CM

## 2017-07-03 LAB — CBC WITH DIFFERENTIAL/PLATELET
BASO%: 0.5 % (ref 0.0–2.0)
Basophils Absolute: 0 10*3/uL (ref 0.0–0.1)
EOS%: 8.5 % — AB (ref 0.0–7.0)
Eosinophils Absolute: 0.4 10*3/uL (ref 0.0–0.5)
HEMATOCRIT: 40.9 % (ref 34.8–46.6)
HGB: 13.9 g/dL (ref 11.6–15.9)
LYMPH#: 0.6 10*3/uL — AB (ref 0.9–3.3)
LYMPH%: 12.9 % — ABNORMAL LOW (ref 14.0–49.7)
MCH: 33.3 pg (ref 25.1–34.0)
MCHC: 34.1 g/dL (ref 31.5–36.0)
MCV: 97.6 fL (ref 79.5–101.0)
MONO#: 0.4 10*3/uL (ref 0.1–0.9)
MONO%: 10.2 % (ref 0.0–14.0)
NEUT%: 67.9 % (ref 38.4–76.8)
NEUTROS ABS: 2.9 10*3/uL (ref 1.5–6.5)
PLATELETS: 268 10*3/uL (ref 145–400)
RBC: 4.19 10*6/uL (ref 3.70–5.45)
RDW: 16.1 % — ABNORMAL HIGH (ref 11.2–14.5)
WBC: 4.3 10*3/uL (ref 3.9–10.3)

## 2017-07-03 LAB — COMPREHENSIVE METABOLIC PANEL
ALT: 17 U/L (ref 0–55)
ANION GAP: 7 meq/L (ref 3–11)
AST: 19 U/L (ref 5–34)
Albumin: 3.5 g/dL (ref 3.5–5.0)
Alkaline Phosphatase: 126 U/L (ref 40–150)
BILIRUBIN TOTAL: 0.29 mg/dL (ref 0.20–1.20)
BUN: 7.9 mg/dL (ref 7.0–26.0)
CALCIUM: 9.9 mg/dL (ref 8.4–10.4)
CO2: 27 meq/L (ref 22–29)
CREATININE: 0.8 mg/dL (ref 0.6–1.1)
Chloride: 105 mEq/L (ref 98–109)
EGFR: 83 mL/min/{1.73_m2} — ABNORMAL LOW (ref >90)
Glucose: 73 mg/dl (ref 70–140)
Potassium: 4 mEq/L (ref 3.5–5.1)
Sodium: 139 mEq/L (ref 136–145)
TOTAL PROTEIN: 6.9 g/dL (ref 6.4–8.3)

## 2017-07-03 NOTE — Progress Notes (Addendum)
AFT-05 [name] : Cycle 2, day 1 Visit: 07/03/2017 Patient presented to clinic unaccompanied and was given PRO Questionnaires to complete by research assistant, Remer Macho. Labs (CBC and Cmet) were drawn and demonstrated no clinically significant abnormalities. Medication list was reviewed and updated. Corrected her baseline medication list to add Calcium/Vit D that she takes daily (Vit D had been listed without Ca+ earlier). She has also started taking OTC echinacea for a few days when she feels cold symptoms coming on and she started this on 07/02/17. She is unsure if she is getting a cold or her seasonal allergies at this time, due to a slight cough she has developed over past two day. Reports she has not had a cold in 5 years. Denies any fever or sputum production. She reports on 06/19/17 she reduced her Cymbalta dose from 90 mg to 60 mg and reports she is doing well on this dose. Tells research nurse she was on 90 mg when her ex-husband was in her life, which added significant stress. Reports her right upper back pain that started on 06/25/17 that was rated 6/10 at it's worst has improved but still present. She had been concerned about this since the intensity was more than her baseline back pain. Made nurse aware of several dental visits she needs to accomplish: having a sonic cleaning on 8/1, then 2 weeks later another cleaning. She also reports needing an extraction. She agrees to discuss addition of Prolia in the future for her osteoporosis with her dentist after her extraction is completed.  Was examined by Wilber Bihari, NP and x-rays of T-spine and L-spine have been ordered. These revealed only thoracic scoliosis, which was noted on CXR on 01/04/12 as well. She reports some mild swelling in her right breast in the evenings that has been present since completion of radiation therapy. She is staying active and reports walking 5 miles this week. Is trying to lose weight with Mediterranean diet with not much  success at this time. Reviewed all her baseline AE's which are stable. Patient forgot to bring her medication diary for Cycle #1 of anastrozole. Research nurse provided her with diary for Cycle #2 and instructed her how to complete, and to bring both back at her next visit. She reports not missing any doses and tolerating medication well. Research assistant will call her the day prior to next appointment to remind her to bring diaries in. She requests to move her 07/31/17 visit to week prior due to going out of town to help her daughter move. Rescheduled Cycle #3 visit for 07/24/17. The research nurse reviewed her PRO's for completeness and accuracy.   April [name] [id number]  07/05/2017  Adverse Event Log  Study/Protocol: [name] Cycle: 1 Adverse Events (06/05/17-07/02/17)  Event Grade Onset Date Resolved Date Attribution Treatment Comments   Back Pain  2  06/25/17     ongoing  N Ordered x-rays of T-S spine Increased from baseline grade 1 back pain   Breast Pain (R)-int.  1  07/03/17     ongoing  N        None Occasional sharp pain-brief  Susan L. Michaelle Copas, Therapist, sports, BSN Clinical Research Nurse 07/03/2017 @ 1600

## 2017-07-03 NOTE — Patient Instructions (Signed)

## 2017-07-04 ENCOUNTER — Ambulatory Visit (HOSPITAL_COMMUNITY)
Admission: RE | Admit: 2017-07-04 | Discharge: 2017-07-04 | Disposition: A | Discharge status: Home / Self Care | Payer: Medicaid Other | Source: Ambulatory Visit | Attending: Oncology | Admitting: Oncology

## 2017-07-04 ENCOUNTER — Other Ambulatory Visit: Payer: Self-pay | Admitting: *Deleted

## 2017-07-04 ENCOUNTER — Telehealth: Payer: Self-pay | Admitting: *Deleted

## 2017-07-04 ENCOUNTER — Encounter: Payer: Self-pay | Admitting: Adult Health

## 2017-07-04 DIAGNOSIS — C773 Secondary and unspecified malignant neoplasm of axilla and upper limb lymph nodes: Secondary | ICD-10-CM | POA: Diagnosis present

## 2017-07-04 DIAGNOSIS — C50911 Malignant neoplasm of unspecified site of right female breast: Secondary | ICD-10-CM | POA: Diagnosis present

## 2017-07-04 DIAGNOSIS — R918 Other nonspecific abnormal finding of lung field: Secondary | ICD-10-CM | POA: Insufficient documentation

## 2017-07-04 NOTE — Telephone Encounter (Signed)
Post CXR results reviewed with LC/NP informed pt of need for antibiotic and use of OTC cough meds for symptom management.  Per communication with pt she stated understanding to call if symptoms worsen.  Pt verbalized understanding as well as need for follow up CXR.

## 2017-07-05 ENCOUNTER — Telehealth: Payer: Self-pay | Admitting: Oncology

## 2017-07-05 NOTE — Telephone Encounter (Signed)
Scheduled appt per sch message from [name] - patient is aware of appt 11/20 added

## 2017-07-06 ENCOUNTER — Telehealth: Payer: Self-pay | Admitting: *Deleted

## 2017-07-06 NOTE — Telephone Encounter (Signed)
Call received reporting "having bronchitis, started on Z-pack and told to call by Friday if I do not feel better for a different antibiotic.  I do not feel better.  Need new antibiotic sent to a different pharmacy.  Call me at [phone]."  Voicemail left for patient to report to ED for further evaluation of increased symptoms with bronchitis.

## 2017-07-06 NOTE — Telephone Encounter (Signed)
Will notify Dr.Magrinat on Monday and follow up with patient.

## 2017-07-07 ENCOUNTER — Other Ambulatory Visit: Payer: Self-pay | Admitting: Adult Health

## 2017-07-07 MED ORDER — PREDNISONE 10 MG PO TABS: 10.0000 mg | ORAL_TABLET | ORAL | 0 refills | Status: DC

## 2017-07-07 NOTE — Telephone Encounter (Signed)
Called patient this morning at 9 am and again this afternoon.  Spoke with her this afternoon. Patient was treated with Zpak.  Today is day 4 of her antibiotics.  She is still having difficulty with some chest restriction feeling when breathing, and cough.  She is slowly improving however.  I will send in Steroid taper today to [facility].  I instructed her to take first thing in the morning and on an empty stomach.  She knows to call for any questions or concerns.    Wilber Bihari, NP

## 2017-07-08 ENCOUNTER — Other Ambulatory Visit: Payer: Self-pay | Admitting: Oncology

## 2017-07-08 DIAGNOSIS — Z170 Estrogen receptor positive status [ER+]: Principal | ICD-10-CM

## 2017-07-08 DIAGNOSIS — C50411 Malignant neoplasm of upper-outer quadrant of right female breast: Secondary | ICD-10-CM

## 2017-07-08 NOTE — Progress Notes (Unsigned)
Added repeat CXR to labs and visit 8/21

## 2017-07-09 ENCOUNTER — Other Ambulatory Visit: Payer: Self-pay | Admitting: Internal Medicine

## 2017-07-09 MED ORDER — PREDNISONE 10 MG PO TABS: 10.0000 mg | ORAL_TABLET | ORAL | 0 refills | Status: DC

## 2017-07-10 ENCOUNTER — Telehealth: Payer: Self-pay

## 2017-07-10 NOTE — Telephone Encounter (Signed)
Call to pharmacy they confirmed pt picked up Prednisone prescription yesterday, however they have pt listed with a different last name. LVM with pt to call back and clarify which last name we should have her listed by.

## 2017-07-16 ENCOUNTER — Telehealth: Payer: Self-pay | Admitting: *Deleted

## 2017-07-16 NOTE — Telephone Encounter (Signed)
07/17/2007 @ 1145: AFT-05 [name]-Cycle 2, day 14 call Research nurse called patient and spoke briefly regarding current status in regards to clinical trial. She reports her acute back pain has resolved and has had no recent breast pain. Her bronchitis that was diagnosed on 07/04/17 is "not much better". Reports she has completed her Z-pack and prednisone taper and chest still feels tight and she is still coughing. She denies any fever or dyspnea. Is not taking any OTC meds either. She tells research nurse she can't talk right now due to her "minutes on my phone are almost used up". Says a friend will be coming over later today so she can use her phone to call Dr. Virgie Dad nurse with update on her status. Informed patient that research nurse will alert collaborative nurse she will be calling this afternoon. She confirmed she is completing her diary and taking her anastrozole daily. Mauri Reading Michaelle Copas Therapist, sports, BSN Clinical Research Nurse 07/16/17 @ 1150

## 2017-07-17 ENCOUNTER — Telehealth: Payer: Self-pay | Admitting: *Deleted

## 2017-07-17 DIAGNOSIS — C773 Secondary and unspecified malignant neoplasm of axilla and upper limb lymph nodes: Principal | ICD-10-CM

## 2017-07-17 DIAGNOSIS — C50911 Malignant neoplasm of unspecified site of right female breast: Secondary | ICD-10-CM

## 2017-07-17 DIAGNOSIS — R05 Cough: Secondary | ICD-10-CM

## 2017-07-17 DIAGNOSIS — R059 Cough, unspecified: Secondary | ICD-10-CM

## 2017-07-17 NOTE — Telephone Encounter (Signed)
This RN returned call to the patient per her VM stating ongoing and recurring cough and lung tightness with SOB. Pt denies any fevers.  Note pt gave new cell number for contact at this time as [phone].  Per discussion and review with MD - plan given for pt to obtain a CXR tomorrow and be worked in for visit by MD.  Above discussed with pt with time for appointment given.  Order for xray entered.

## 2017-07-18 ENCOUNTER — Encounter: Payer: Self-pay | Admitting: *Deleted

## 2017-07-18 ENCOUNTER — Telehealth: Payer: Self-pay | Admitting: *Deleted

## 2017-07-18 ENCOUNTER — Ambulatory Visit (HOSPITAL_COMMUNITY)
Admission: RE | Admit: 2017-07-18 | Discharge: 2017-07-18 | Disposition: A | Discharge status: Home / Self Care | Payer: Medicaid Other | Source: Ambulatory Visit | Attending: Oncology | Admitting: Oncology

## 2017-07-18 ENCOUNTER — Ambulatory Visit (HOSPITAL_BASED_OUTPATIENT_CLINIC_OR_DEPARTMENT_OTHER): Payer: Medicaid Other | Admitting: Oncology

## 2017-07-18 VITALS — BP 126/81 | HR 105 | Temp 98.5°F | Resp 18 | Ht 64.0 in | Wt 228.8 lb

## 2017-07-18 DIAGNOSIS — Z170 Estrogen receptor positive status [ER+]: Secondary | ICD-10-CM

## 2017-07-18 DIAGNOSIS — C773 Secondary and unspecified malignant neoplasm of axilla and upper limb lymph nodes: Secondary | ICD-10-CM | POA: Diagnosis not present

## 2017-07-18 DIAGNOSIS — C50411 Malignant neoplasm of upper-outer quadrant of right female breast: Secondary | ICD-10-CM

## 2017-07-18 DIAGNOSIS — C50911 Malignant neoplasm of unspecified site of right female breast: Secondary | ICD-10-CM

## 2017-07-18 DIAGNOSIS — R059 Cough, unspecified: Secondary | ICD-10-CM

## 2017-07-18 DIAGNOSIS — R918 Other nonspecific abnormal finding of lung field: Secondary | ICD-10-CM | POA: Diagnosis not present

## 2017-07-18 DIAGNOSIS — R05 Cough: Secondary | ICD-10-CM | POA: Diagnosis not present

## 2017-07-18 MED ORDER — BENZONATATE 100 MG PO CAPS: 100.0000 mg | ORAL_CAPSULE | Freq: Two times a day (BID) | ORAL | 0 refills | Status: DC | PRN

## 2017-07-18 MED ORDER — PROMETHAZINE-CODEINE 6.25-10 MG/5ML PO SYRP: 5.0000 mL | ORAL_SOLUTION | Freq: Three times a day (TID) | ORAL | 0 refills | Status: DC | PRN

## 2017-07-18 NOTE — Progress Notes (Signed)
Addendum added to make correction on stop date of prednisone. Mauri Reading Michaelle Copas Therapist, sports, BSN Clinical Research Nurse 07/18/17 @ [phone]

## 2017-07-18 NOTE — Progress Notes (Signed)
07/18/17 @12 :30- Patient to see Dr. Jana Hakim for unscheduled visit due to continued cough, that she reports started 2 weeks prior to her CXR on 07/04/17  despite treatment with Z-pack 250 mg. Started taking #2 tabs on 07/04/17, then #1 tab/day X 4 days with course completed on 07/08/17. Tells research nurse that this script came from Dr. Virgie Dad office(no documentation in EPIC). She also completed her Prednisone taper on 07/14/17. She also reports general malaise as well. CXR today shows persistent right mid lung field infiltrate that was present on CXR 07/04/17 and CT chest of 05/04/17. Dr. Jana Hakim has ordered CT chest for 07/23/17 and she will see Benard Rink, NP on 07/24/17 as scheduled. Dr. Jana Hakim ordered benzonatate capsules and codeine w/phenergan cough syrup for management of persistent dry cough w/occasional clear sputum production. She denies any fever.  She reports that she still has had occasional pain in upper back, but has not required medication. Has had no recent sharp breast pain as noted on 07/03/17. Continues to have mild hot flashes, which were present at baseline. Inquired of patient the reason for her name change back to [employer] from [place]. She reports Rhodia Albright is her legally married name and she plans on filing for divorce in September and having her name changed back to her maiden name Albertina Parr at that time. She reports her Medicaid is through [place], so she will maintain this name until the divorce is granted. Patient also returned her Cycle 1 anti-hormone diary today with no missed doses documented. Mauri Reading Michaelle Copas Therapist, sports, BSN Clinical Research Nurse 07/18/2017 @ [phone]
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
## 2017-07-18 NOTE — Addendum Note (Signed)
Addended by: Tania Ade on: 07/18/2017 02:12 PM   Modules accepted: Orders

## 2017-07-18 NOTE — Telephone Encounter (Signed)
Research nurse called patient and confirmed she will be in today for CXR and see Dr. Jana Hakim. Informed her that research nurse will see her as well and requested she bring her July medication diary with her. She understands and agrees. Mauri Reading Michaelle Copas Therapist, sports, BSN Clinical Research Nurse 07/19/47 @ [phone]

## 2017-07-20 NOTE — Progress Notes (Signed)
Addendum added for MD signature for research note on 07/03/17

## 2017-07-23 ENCOUNTER — Other Ambulatory Visit: Payer: Self-pay | Admitting: Oncology

## 2017-07-23 ENCOUNTER — Encounter: Payer: Medicaid Other | Admitting: Adult Health

## 2017-07-23 ENCOUNTER — Telehealth: Payer: Self-pay
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
## 2017-07-23 NOTE — Progress Notes (Signed)
CLINIC:  Survivorship   REASON FOR VISIT:  Routine follow-up post-treatment for a recent history of breast cancer.  BRIEF ONCOLOGIC HISTORY:    Malignant neoplasm of upper-outer quadrant of right breast in female, estrogen receptor positive ([place])   06/21/2016 Initial Diagnosis    Malignant neoplasm of upper-outer quadrant of right breast in female, estrogen receptor positive ([place])     06/21/2016 Initial Biopsy    Right breast biopsy, 10 oclock: IDC, grade 3, ER+(95%), PR+(80%),Ki67 20%, HER-2 negative (ratio 0.67). Right axilla core biopsy: carcinoma, grade 3, ER+(100%), PR+(90%), Ki67 25%, HER-2 negative (ratio 1.13).       07/17/2016 - 11/06/2016 Neo-Adjuvant Chemotherapy    Received 2 cycles of Doxorubicin and Cyclophosphamide, then transitioned to weekly Paclitaxel (due to repeated febrile neutropenia) x 7 cycles, stopped early due to peripheral neuropathy, then completed her final 2 cycles of Doxorubicin and Cyclophosphamide.       12/19/2016 Surgery    Right breast lumpectomy ([facility]): IDC, grade 2, 1.6cm and 0.3cm, margins negative, 3 SLN positive for metastatic carcinoma.        12/26/2016 Surgery    ALND: metastatic carcinoma in one of 20 lymph nodes, and three nodes from previous lumpectomy.  Four positive nodes, consistent with pN2a.      02/07/2017 - 03/21/2017 Radiation Therapy    Adjuvant radiation Isidore Moos): 1) Right breast and nodes - 4 field: 50 Gy in 25 fractions. IM NODES: >95% receive at least 45Gy/27f. 50Gy to SCLV/PAB @ 2Gy /fraction x 25 fractions. 2) Right breast boost: 10 Gy in 5 fractions      04/2017 -  Anti-estrogen oral therapy    Anastrozole '1mg'$  daily.  Bone density 03/23/2017 finds T score of -2.6, osteoporosis, plan to start Prolia following dental clearance  On [name], trial randomized to endocrine therapy alone       INTERVAL HISTORY:  [facility] initial meeting to review her survivorship care plan  detailing her treatment course for breast cancer, as well as monitoring long-term side effects of that treatment, education regarding health maintenance, screening, and overall wellness and health promotion.     Overall, Ms. April Albrightreports feeling quite well.  She is taking Anastrozole daily.  She continues to tolerate the Anastrozole well.  She does have a h/o headaches, and has a headache right now.  She continues to have a cough.  She was prescribed a couple of different cough medications.  She is taking the Phenergan with codeine.  She could not afford to take the [employer]  Medicaid does not pay for cough medications according to her pharmacy.  Her cough is still present, though not as bad as it previously was.  She is scheduled for her CT scan on Friday.  She is going out of [place] on [place]      REVIEW OF SYSTEMS:  Review of Systems  Constitutional: Negative for appetite change, chills, fatigue, fever and unexpected weight change.  HENT:   Negative for hearing loss and lump/mass.   Eyes: Negative for eye problems and icterus.  Respiratory: Negative for chest tightness, cough and shortness of breath.   Cardiovascular: Negative for chest pain, leg swelling and palpitations.  Gastrointestinal: Negative for abdominal distention, abdominal pain, constipation, diarrhea, nausea and vomiting.  Endocrine: Positive for hot flashes.  Genitourinary: Negative for difficulty urinating and dyspareunia.   Skin: Negative for itching and rash.  Neurological: Positive for  headaches. Negative for dizziness and extremity weakness.  Hematological: Negative for adenopathy.  Psychiatric/Behavioral: Negative for depression. The patient is not nervous/anxious.   Breast: Denies any new nodularity, masses, tenderness, nipple changes, or nipple discharge.      ONCOLOGY TREATMENT TEAM:  1. Surgeon:  Dr. Excell Seltzer at [facility] 2. Medical  Oncologist: Dr. Jana Hakim 3. Radiation Oncologist: Dr. Isidore Moos    PAST MEDICAL/SURGICAL HISTORY:  Past Medical History:  Diagnosis Date  . Allergy 07/28/2012   Seasonal/Environmental allergies  . Anxiety 2013   Since 2013  . Arthritis 2014 per patient    knees and shoulders  . Bilateral ankle fractures 07/2015   Booted and resolved   . Cancer [name]) dx June 22, 2016   right breast  . Depression 2013   Multiple  episodes  in past.  . Fibromyalgia 2013   diagnosed by Dr. [name]  . Genital herpes 2005   Has outbreaks monthly if not on preventative medication  . GERD (gastroesophageal reflux disease) 2013  . History of radiation therapy 02/07/17- 03/21/17   Right Breast- 4 field 25 fractions. 50 Gy to SCLV/PAB in 25 fractions. Right Breast Boost 10 gy in 5 fractions.  . Migraine 2013   migraines  . 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 Obesity   . Osteoporosis 03/23/2017   noted per bone density scan  . Personal history of chemotherapy 11/2016  . Personal history of radiation therapy    4/18  . Right wrist fracture 06/2015   Resolved  . Scoliosis of thoracic spine 01/04/2012  . Skin condition 2013   patient reports periodic episodes of severe itching. She will itch and then blister at areas including her arms, back, and buttocks.   . Urinary, incontinence, stress female 07/14/2016   patient reported   Past Surgical History:  Procedure Laterality Date  . AXILLARY LYMPH NODE DISSECTION Right 12/26/2016   Procedure: RIGHT AXILLARY LYMPH NODE DISSECTION;  Surgeon: Excell Seltzer, MD;  Location: [place];  Service: General;  Laterality: Right;  . BREAST LUMPECTOMY WITH NEEDLE LOCALIZATION Right 12/19/2016   Procedure: RIGHT BREAST NEEDLE LOCALIZED LUMPECTOMY, RIGHT RADIOACTIVE SEED TARGETED AXILLARY SENTINEL LYMPH NODE BIOPSY;  Surgeon: Excell Seltzer, MD;  Location: [place];   Service: General;  Laterality: Right;  . IR GENERIC HISTORICAL  10/09/2016   IR CV LINE INJECTION 10/09/2016 Aletta Edouard, MD [facility]  . PORT-A-CATH REMOVAL Left 12/19/2016   Procedure: REMOVAL PORT-A-CATH;  Surgeon: Excell Seltzer, MD;  Location: [facility];  Service: General;  Laterality: Left;  . PORTACATH PLACEMENT N/A 07/11/2016   Procedure: INSERTION PORT-A-CATH;  Surgeon: Excell Seltzer, MD;  Location: [facility];  Service: General;  Laterality: N/A;  . RADIOACTIVE SEED GUIDED AXILLARY SENTINEL LYMPH NODE Right 12/19/2016   Procedure: RADIOACTIVE SEED GUIDED AXILLARY SENTINEL LYMPH NODE BIOPSY;  Surgeon: Excell Seltzer, MD;  Location: [facility];  Service: General;  Laterality: Right;  . WISDOM TOOTH EXTRACTION  yrs ago     ALLERGIES:  Allergies  Allergen Reactions  . Hydrocodone Nausea Only and Other (See Comments)    dizziness  . Ultram [Tramadol Hcl] Nausea Only  . Gabapentin Rash     CURRENT MEDICATIONS:  Outpatient Encounter Prescriptions as of 07/24/2017  Medication Sig Note  . anastrozole (ARIMIDEX) 1 MG tablet Take 1 tablet (1 mg total) by mouth daily. (Patient taking differently: Take 1 mg by mouth daily. )   .  b complex vitamins tablet Take 1 tablet by mouth daily.   . butalbital-acetaminophen-caffeine (FIORICET, ESGIC) 50-325-40 MG tablet TAKE 2 TABLETS BY MOUTH EVERY 6 HOURS AS NEEDED FOR HEADACHE/MIGRAINE   . Calcium Carb-Cholecalciferol (CALCIUM/VITAMIN D) 600-400 MG-UNIT TABS Take 2 tablets by mouth daily. Reports starting January 2018   . DULoxetine (CYMBALTA) 60 MG capsule Take 1 capsule (60 mg total) by mouth daily. Take along with '30mg'$  capsule for a total of '90mg'$  of cymbalta per day. (Patient taking differently: Take 60 mg by mouth daily. )   . ECHINACEA PO Take 2 capsules by mouth 2 (two) times daily as needed.   Marland Kitchen ibuprofen (ADVIL,MOTRIN) 800 MG tablet TAKE 1 TABLET BY MOUTH EVERY 8 HOURS AS NEEDED.   Marland Kitchen loratadine  (CLARITIN) 10 MG tablet Take 10 mg by mouth daily.   . mometasone (NASONEX) 50 MCG/ACT nasal spray 2 sprays each nostril daily 07/06/2016: -  . promethazine-codeine (PHENERGAN WITH CODEINE) 6.25-10 MG/5ML syrup Take 5 mLs by mouth every 8 (eight) hours as needed for cough.   . ranitidine (ZANTAC) 75 MG tablet Take 75 mg by mouth daily.  05/30/2017: Takes for heartburn prevention  . valACYclovir (VALTREX) 1000 MG tablet Take 0.5 tablets (500 mg total) by mouth 2 (two) times daily.   Marland Kitchen VITAMIN E, TOPICAL, CREA Apply 1 application topically daily.   . [DISCONTINUED] benzonatate (TESSALON) 100 MG capsule Take 1 capsule (100 mg total) by mouth 2 (two) times daily as needed for cough.    No facility-administered encounter medications on file as of 07/24/2017.      ONCOLOGIC FAMILY HISTORY:  Family History  Problem Relation Age of Onset  . Arthritis Mother   . Hypertension Mother   . Heart disease Mother   . Dementia Mother   . Irritable bowel syndrome Mother   . Emphysema Father   . Cancer Father        bladder  . Cerebral aneurysm Father        ruptured aneurysm was cause of death  . Bipolar disorder Daughter        Not clear if this is the case.  Possibly Bipolar II  . Depression Daughter   . Graves' disease Sister   . Vitiligo Sister   . Mental illness Brother        Depression  . Mental illness Sister        likely undiagnosed schizophrenia  . Mental illness Brother        Schizophrenia      SOCIAL HISTORY:  April [name] is separated and lives alone in [place], [place].  She has a daughter who is moving back to [place] next week from [place].  She denies any current or history of tobacco, alcohol, or illicit drug use.     PHYSICAL EXAMINATION:  Vital Signs:   Vitals:   07/24/17 1316  BP: 138/88  Pulse: 89  Resp: 18  Temp: 98.7 F (37.1 C)  SpO2: 100%   Filed Weights   07/24/17 1316  Weight: 230 lb 3.2 oz (104.4 kg)  ECOG: 1 General: Well-nourished,  well-appearing female in no acute distress.  She is unaccompanied today.   HEENT: Head is normocephalic.  Pupils equal and reactive to light. Conjunctivae clear without exudate.  Sclerae anicteric. Oral mucosa is pink, moist.  Oropharynx is pink without lesions or erythema.  Lymph: No cervical, supraclavicular, or infraclavicular lymphadenopathy noted on palpation.  Cardiovascular: Regular rate and rhythm.Marland Kitchen Respiratory: Clear to auscultation bilaterally. Chest expansion  symmetric; breathing non-labored.  GI: Abdomen soft and round; non-tender, non-distended. Bowel sounds normoactive.  GU: Deferred.  Neuro: No focal deficits. Steady gait.  Psych: Mood and affect normal and appropriate for situation.  Extremities: No edema. MSK: No focal spinal tenderness to palpation.  Full range of motion in bilateral upper extremities Skin: Warm and dry.  LABORATORY DATA:  None for this visit.  DIAGNOSTIC IMAGING:  None for this visit.      ASSESSMENT AND PLAN:  Ms.. April [name] is a pleasant 56 y.o. 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  She presents to the Survivorship Clinic for our initial meeting and routine follow-up post-completion of treatment for breast cancer.    1. Stage IIIA right breast cancer:  [name] is continuing to recover from definitive treatment for breast cancer. She will follow-up with her medical oncologist, Dr. Jana Hakim in 10/2017 with history and physical exam per surveillance protocol (on [name] trial randomized to endocrine only arm).  She will continue her anti-estrogen therapy with Anastrozole. Thus far, she is tolerating the Anastrozole well, with minimal side effects. She was instructed to make Dr. Jana Hakim or myself aware if she begins to experience any worsening side  effects of the medication and I could see her back in clinic to help manage those side effects, as needed. Today, a comprehensive survivorship care plan and treatment summary was reviewed with the patient today detailing her breast cancer diagnosis, treatment course, potential late/long-term effects of treatment, appropriate follow-up care with recommendations for the future, and patient education resources.  A copy of this summary, along with a letter will be sent to the patient's primary care provider via mail/fax/In Basket message after today's visit.    2. Cough/abnormal chest xray: She took a course of Zpak and a Prednisone taper.  She is doing better today and is certainly no worse.  She does note she has some mild heaviness that has continued in her chest.  She will undergo CT chest on Friday for further evaluation.  Dr. Jana Hakim or myself will call her with the results either on Friday, or Monday.    3. Bone health:  Given [name]'s age/history of breast cancer and her current treatment regimen including anti-estrogen therapy with Anastrozole, she is at risk for bone demineralization.  Her last DEXA scan was 03/2017 and demonstrated a T xcore of -2.6 consistent with osteoporosis.  She needed dental clearance for Prolia, however she is going to have to undergo a couple of teeth extractions.  The Prolia will be started once she receives a dental clearance.  In the meantime, she was encouraged to increase her consumption of foods rich in calcium, as well as increase her weight-bearing activities.  She was given education on specific activities to promote bone health.  4. Cancer screening:  Due to [name]'s history and her age, she should receive screening for skin cancers, colon cancer, and gynecologic cancers.  The information and recommendations are listed on the patient's comprehensive care plan/treatment summary and were reviewed in detail with the patient.    5. Health maintenance and wellness  promotion: [name] was encouraged to consume 5-7 servings of fruits and vegetables per day. We reviewed the "Nutrition Rainbow" handout, as well as the handout "Take Control of Your Health and Reduce Your Cancer Risk" from the [facility].  She was  also encouraged to engage in moderate to vigorous exercise for 30 minutes per day most days of the week. We discussed the [employer] fitness program, which is designed for cancer survivors to help them become more physically fit after cancer treatments.  She was instructed to limit her alcohol consumption and continue to abstain from tobacco use.     6. Support services/counseling: It is not uncommon for this period of the patient's cancer care trajectory to be one of many emotions and stressors.  We discussed an opportunity for her to participate in the next session of [facility] ("Finding Your New Normal") support group series designed for patients after they have completed treatment.   [name] was encouraged to take advantage of our many other support services programs, support groups, and/or counseling in coping with her new life as a cancer survivor after completing anti-cancer treatment.  She was offered support today through active listening and expressive supportive counseling.  She was given information regarding our available services and encouraged to contact me with any questions or for help enrolling in any of our support group/programs.    Dispo:   -Return to cancer center for follow up with Dr. Jana Hakim on 10/23/2017 -Mammogram due in 06/2018 -Follow up with Dr. Excell Seltzer, next available -She is welcome to return back to the Survivorship Clinic at any time; no additional follow-up needed at this time.  -Consider referral back to survivorship as a long-term survivor for continued surveillance  A total of (50) minutes of face-to-face time was spent with this patient with greater than 50% of that time in counseling and  care-coordination.   Gardenia Phlegm, NP Survivorship Program [facility] [phone]   Note: PRIMARY CARE PROVIDER [name], [place] [phone]

## 2017-07-23 NOTE — Telephone Encounter (Signed)
PA received for CT CAP.  Central Scheduling notified and asked to call pt to schedule for first available.

## 2017-07-24 ENCOUNTER — Encounter: Payer: Self-pay | Admitting: Adult Health

## 2017-07-24 ENCOUNTER — Ambulatory Visit (HOSPITAL_BASED_OUTPATIENT_CLINIC_OR_DEPARTMENT_OTHER): Payer: Medicaid Other | Admitting: Adult Health

## 2017-07-24 ENCOUNTER — Encounter: Payer: Medicaid Other | Admitting: *Deleted

## 2017-07-24 ENCOUNTER — Other Ambulatory Visit (HOSPITAL_BASED_OUTPATIENT_CLINIC_OR_DEPARTMENT_OTHER): Payer: Medicaid Other

## 2017-07-24 VITALS — BP 138/88 | HR 89 | Temp 98.7°F | Resp 18 | Ht 64.0 in | Wt 230.2 lb

## 2017-07-24 DIAGNOSIS — Z79811 Long term (current) use of aromatase inhibitors: Secondary | ICD-10-CM

## 2017-07-24 DIAGNOSIS — C773 Secondary and unspecified malignant neoplasm of axilla and upper limb lymph nodes: Secondary | ICD-10-CM

## 2017-07-24 DIAGNOSIS — Z006 Encounter for examination for normal comparison and control in clinical research program: Secondary | ICD-10-CM | POA: Diagnosis not present

## 2017-07-24 DIAGNOSIS — C50411 Malignant neoplasm of upper-outer quadrant of right female breast: Secondary | ICD-10-CM

## 2017-07-24 DIAGNOSIS — Z170 Estrogen receptor positive status [ER+]: Secondary | ICD-10-CM | POA: Diagnosis not present

## 2017-07-24 DIAGNOSIS — M818 Other osteoporosis without current pathological fracture: Secondary | ICD-10-CM

## 2017-07-24 LAB — CBC & DIFF AND RETIC
BASO%: 0.2 % (ref 0.0–2.0)
Basophils Absolute: 0 10*3/uL (ref 0.0–0.1)
EOS ABS: 0.3 10*3/uL (ref 0.0–0.5)
EOS%: 5.4 % (ref 0.0–7.0)
HCT: 40.3 % (ref 34.8–46.6)
HEMOGLOBIN: 13.5 g/dL (ref 11.6–15.9)
IMMATURE RETIC FRACT: 5 % (ref 1.60–10.00)
LYMPH%: 12.2 % — AB (ref 14.0–49.7)
MCH: 33.3 pg (ref 25.1–34.0)
MCHC: 33.5 g/dL (ref 31.5–36.0)
MCV: 99.5 fL (ref 79.5–101.0)
MONO#: 0.4 10*3/uL (ref 0.1–0.9)
MONO%: 7.1 % (ref 0.0–14.0)
NEUT%: 75.1 % (ref 38.4–76.8)
NEUTROS ABS: 4.1 10*3/uL (ref 1.5–6.5)
Platelets: 241 10*3/uL (ref 145–400)
RBC: 4.05 10*6/uL (ref 3.70–5.45)
RDW: 15.5 % — ABNORMAL HIGH (ref 11.2–14.5)
Retic %: 1.25 % (ref 0.70–2.10)
Retic Ct Abs: 50.63 10*3/uL (ref 33.70–90.70)
WBC: 5.4 10*3/uL (ref 3.9–10.3)
lymph#: 0.7 10*3/uL — ABNORMAL LOW (ref 0.9–3.3)

## 2017-07-24 LAB — COMPREHENSIVE METABOLIC PANEL
ALBUMIN: 3.3 g/dL — AB (ref 3.5–5.0)
ALK PHOS: 133 U/L (ref 40–150)
ALT: 19 U/L (ref 0–55)
ANION GAP: 6 meq/L (ref 3–11)
AST: 22 U/L (ref 5–34)
BILIRUBIN TOTAL: 0.33 mg/dL (ref 0.20–1.20)
BUN: 8.5 mg/dL (ref 7.0–26.0)
CO2: 27 meq/L (ref 22–29)
CREATININE: 0.8 mg/dL (ref 0.6–1.1)
Calcium: 9.9 mg/dL (ref 8.4–10.4)
Chloride: 107 mEq/L (ref 98–109)
EGFR: 78 mL/min/{1.73_m2} — ABNORMAL LOW (ref >90)
Glucose: 94 mg/dl (ref 70–140)
Potassium: 4.3 mEq/L (ref 3.5–5.1)
SODIUM: 140 meq/L (ref 136–145)
TOTAL PROTEIN: 6.7 g/dL (ref 6.4–8.3)

## 2017-07-24 NOTE — Progress Notes (Signed)
Patient in clinic today for her Cycle 3, day 1 visit for [name] trial.  Patient here one week early due to going out of town next week when her cycle 3, day 1 visit was due. (Study allows +/- 7 days window for this visit). Upon arrival to clinic she was given questionnaires/PROs to complete. Labs were drawn and then patient was seen by Wilber Bihari, NP for evaluation.  Accompanied patient on her visit with Mendel Ryder.  Patient denies any changes to her medications.  She says she never filled the Benzonatate rx as it was not covered by her insurance.  She did fill and is taking phenergan w/ codeine syrup for her cough.  Patient reports coughing and congestion are better, but she still has a nagging dry cough.  States feeling less tired. Denies any fevers or shortness of breath.  States her chest feels a little heavy when trying to take a deep breath. CT scan was not able to be scheduled as ordered for yesterday.  It is now scheduled for this Friday 07/27/17.  Mendel Ryder informed patient that she or Dr. Jana Hakim will call her with the results when available.  Patient denies any new or worsening adverse events on this visit.  She is tolerating Anastrozole without any noticeable side effects.  Collected completed questionnaires and reviewed for completeness and accuracy.  Gave patient self addressed postage paid envelope to return her cycle 2 endocrine therapy diary to research nurse when it is complete.  Gave patient new diaries for cycles 3, 4 and 5 with brief reminder on how to complete.  Patient verbalized understanding.  Patient's appointment for cycle 6 is already scheduled for 10/23/17. Patient confirmed date and time of next appointment.  Thanked patient very much for her time and ongoing participation in this clinical trial.  Encouraged her to call research nurse if any questions or concerns prior to next contact. She verbalized understanding.  Foye Spurling, BSN, RN Clinical Research Nurse 07/24/2017 2:46  PM

## 2017-07-27 ENCOUNTER — Ambulatory Visit (HOSPITAL_COMMUNITY)
Admission: RE | Admit: 2017-07-27 | Discharge: 2017-07-27 | Disposition: A | Discharge status: Home / Self Care | Payer: Medicaid Other | Source: Ambulatory Visit | Attending: Oncology | Admitting: Oncology

## 2017-07-27 DIAGNOSIS — Z170 Estrogen receptor positive status [ER+]: Secondary | ICD-10-CM | POA: Diagnosis present

## 2017-07-27 DIAGNOSIS — C50411 Malignant neoplasm of upper-outer quadrant of right female breast: Secondary | ICD-10-CM | POA: Diagnosis not present

## 2017-07-27 DIAGNOSIS — C773 Secondary and unspecified malignant neoplasm of axilla and upper limb lymph nodes: Secondary | ICD-10-CM | POA: Diagnosis present

## 2017-07-27 DIAGNOSIS — C50911 Malignant neoplasm of unspecified site of right female breast: Secondary | ICD-10-CM

## 2017-07-27 MED ORDER — IOPAMIDOL (ISOVUE-300) INJECTION 61%
75.0000 mL | Freq: Once | INTRAVENOUS | Status: AC | PRN
  Administered 2017-07-27: 75 mL via INTRAVENOUS

## 2017-07-27 MED ORDER — IOPAMIDOL (ISOVUE-300) INJECTION 61%
INTRAVENOUS | Status: AC
  Filled 2017-07-27: qty 75

## 2017-07-30 ENCOUNTER — Ambulatory Visit: Payer: Self-pay | Admitting: Internal Medicine

## 2017-07-30 ENCOUNTER — Telehealth: Payer: Self-pay | Admitting: Adult Health

## 2017-07-30 NOTE — Telephone Encounter (Signed)
LMOM to discuss her CT chest results.    Wilber Bihari, NP

## 2017-07-31 ENCOUNTER — Encounter: Payer: Medicaid Other | Admitting: Adult Health

## 2017-07-31 ENCOUNTER — Other Ambulatory Visit: Payer: Medicaid Other

## 2017-08-07 ENCOUNTER — Encounter: Payer: Self-pay | Admitting: *Deleted

## 2017-08-07 DIAGNOSIS — C50411 Malignant neoplasm of upper-outer quadrant of right female breast: Secondary | ICD-10-CM

## 2017-08-07 DIAGNOSIS — Z170 Estrogen receptor positive status [ER+]: Principal | ICD-10-CM

## 2017-08-07 NOTE — Progress Notes (Signed)
  MIKELL CAMP [id number]  08/07/2017  Adverse Event Log-Documentation from visit 07/24/17  Study/Protocol: [name] Cycle: 2 Adverse Events (07/03/17-07/24/17)  Event Grade Onset Date Resolved Date Attribution Treatment Comments   Back Pain  2  06/25/17     ongoing  No Ordered x-rays of T-S spine Increased from baseline grade 1 back pain   Breast Pain (R)-int.  1  07/03/17     ongoing  No        None Occasional sharp pain-brief  Cough 2 06/20/17 ongoing No Z-pack, Prednisone, Codeine w/Phenergan cough syrup CT chest ordered  Malaise 1 07/04/17 ongoing No     Hypoalbuminemia 1 07/24/17 ongoing Yes  No change in therapy  Susan L. 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 No further treatment at this time. Will continue to monitor. Mauri Reading Michaelle Copas Therapist, sports, BSN Clinical Research Nurse 08/07/17 @ [phone]

## 2017-08-07 NOTE — Progress Notes (Signed)
Error

## 2017-08-13 ENCOUNTER — Encounter: Payer: Self-pay | Admitting: Internal Medicine

## 2017-08-13 ENCOUNTER — Ambulatory Visit (INDEPENDENT_AMBULATORY_CARE_PROVIDER_SITE_OTHER): Payer: Medicaid Other | Admitting: Internal Medicine

## 2017-08-13 VITALS — BP 138/100 | HR 92 | Resp 14 | Ht 63.0 in | Wt 218.0 lb

## 2017-08-13 DIAGNOSIS — G620 Drug-induced polyneuropathy: Secondary | ICD-10-CM

## 2017-08-13 DIAGNOSIS — R030 Elevated blood-pressure reading, without diagnosis of hypertension: Secondary | ICD-10-CM | POA: Diagnosis not present

## 2017-08-13 DIAGNOSIS — Z720 Tobacco use: Secondary | ICD-10-CM

## 2017-08-13 DIAGNOSIS — Z23 Encounter for immunization: Secondary | ICD-10-CM

## 2017-08-13 DIAGNOSIS — M797 Fibromyalgia: Secondary | ICD-10-CM

## 2017-08-13 DIAGNOSIS — G629 Polyneuropathy, unspecified: Secondary | ICD-10-CM

## 2017-08-13 HISTORY — DX: Polyneuropathy, unspecified: G62.9

## 2017-08-13 MED ORDER — PREGABALIN 75 MG PO CAPS: 75.0000 mg | ORAL_CAPSULE | Freq: Two times a day (BID) | ORAL | 11 refills | Status: DC

## 2017-08-13 NOTE — Progress Notes (Signed)
Subjective:    Patient ID: April [name], female    DOB: August 15, 1961, 56 y.o.   MRN: [id number]  HPI   1.  History of Breast Cancer--diagnosed 06/2016:  In a clinical trial PALAS study for metastatic breast cancer.  She is not on the study drug.  She continues on Arimidex 1 mg daily. Received Adriamycin--2 treatments, followed by Cytoxan, 7 rounds of 10, then 2 more rounds of Adriamycin.  Burning in hands and feet worse with use of Cytoxan. Underwent right lumpectomy, with 3 lymph nodes + and then a second axillary lymph node dissection in 12/2016, followed by radiation. Last radiation treatment was 4.18.2018    Is soon to receive disability with breast cancer.  Has neuropathy in toes and hands.  Wrists hurt all the time as well.  Has lymphedema in right breast--swells and hurts--not clear that this was actually diagnosed.  Does not have lymphedema of the arm.    2.  Chronic pain:  Feels achy all over, similar to when has the flu.  Her pain and her burning in hands or feet are much worse since chemo.  Had rash that may or may not have been related to Gabapentin in the past.  Has never been on Lyrica.  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 She continues on Cymbalta.   2.  Cough:  Unfortunately, restarted smoking/vaping 2 months ago.  She feels she can stop at any time.  Started coughing after restarting cigarettes.  3.  Social:  Continues with marital issues.  Working on a divorce.    Current Meds  Medication Sig  . anastrozole (ARIMIDEX) 1 MG tablet Take 1 tablet (1 mg total) by mouth daily. (Patient taking differently: Take 1 mg by mouth daily. )  . b complex vitamins tablet Take 1 tablet by mouth daily.  . butalbital-acetaminophen-caffeine (FIORICET, ESGIC) 50-325-40 MG tablet TAKE 2 TABLETS BY MOUTH EVERY 6 HOURS AS NEEDED FOR HEADACHE/MIGRAINE  . Calcium Carb-Cholecalciferol (CALCIUM/VITAMIN D) 600-400 MG-UNIT TABS Take 2  tablets by mouth daily. Reports starting January 2018  . DULoxetine (CYMBALTA) 60 MG capsule Take 1 capsule (60 mg total) by mouth daily. Take along with 30mg  capsule for a total of 90mg  of cymbalta per day. (Patient taking differently: Take 60 mg by mouth daily. )  . ECHINACEA PO Take 2 capsules by mouth 2 (two) times daily as needed.  Marland Kitchen ibuprofen (ADVIL,MOTRIN) 800 MG tablet TAKE 1 TABLET BY MOUTH EVERY 8 HOURS AS NEEDED.  Marland Kitchen loratadine (CLARITIN) 10 MG tablet Take 10 mg by mouth daily.  . mometasone (NASONEX) 50 MCG/ACT nasal spray 2 sprays each nostril daily  . ranitidine (ZANTAC) 75 MG tablet Take 75 mg by mouth daily.   . valACYclovir (VALTREX) 1000 MG tablet Take 0.5 tablets (500 mg total) by mouth 2 (two) times daily.    Allergies  Allergen Reactions  . Hydrocodone Nausea Only and Other (See Comments)    dizziness  . Ultram [Tramadol Hcl] Nausea Only  . Gabapentin Rash     Review of Systems     Objective:   Physical Exam  NAD Lungs:  CTA CV:  RRR without murmur or rub, radial and DP pulses normal and equal LE:  No edema Feet:  Bilateral mild swelling and redness overlying MTP of great toes.  Tender on left really only.        Assessment & Plan:  1.  Chronic pain syndrome:  Generalized  aches See number #2  2.  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  Not clear if truly rash she had in past related to Gabapentin.  Describes still having intermittent skin lesions as would expect with neurodermatitis.   Will try Lyrica for #1 and #2 and titrate as tolerates.  To stop if develops a rash.  75 mg twice daily to start.  3.  1st MTP swelling, redness and pain:  Possibly just OA, but will check uric acid when returns for fasting lipids.  Had fine CMP, CBC with labs in August.   4.  HM:  Tdap and Pneumococcal vaccines today.  Encouraged influenza vaccine at one of our clinics later in month.  5.  Tobacco Use:  Encouraged discontinuation,  particularly with recent treatment for breast cancer.  She does not feel she needs to utilize nicotine gum.

## 2017-08-14 ENCOUNTER — Other Ambulatory Visit (INDEPENDENT_AMBULATORY_CARE_PROVIDER_SITE_OTHER): Payer: Medicaid Other

## 2017-08-14 DIAGNOSIS — Z13220 Encounter for screening for lipoid disorders: Secondary | ICD-10-CM | POA: Diagnosis not present

## 2017-08-14 DIAGNOSIS — M2550 Pain in unspecified joint: Secondary | ICD-10-CM

## 2017-08-14 NOTE — Telephone Encounter (Signed)
Error message

## 2017-08-15 ENCOUNTER — Telehealth: Payer: Self-pay | Admitting: Internal Medicine

## 2017-08-15 LAB — LIPID PANEL W/O CHOL/HDL RATIO
Cholesterol, Total: 228 mg/dL — ABNORMAL HIGH (ref 100–199)
HDL: 60 mg/dL (ref >39)
LDL CALC: 152 mg/dL — AB (ref 0–99)
Triglycerides: 82 mg/dL (ref 0–149)
VLDL Cholesterol Cal: 16 mg/dL (ref 5–40)

## 2017-08-15 LAB — URIC ACID: URIC ACID: 4 mg/dL (ref 2.5–7.1)

## 2017-08-15 NOTE — Telephone Encounter (Signed)
Spoke with patient. States she had a fever of 102.1 about a hour ago. States body aches started last night and had migraine when she woke up this morning. Patient denies any nausea, vomiting, diarrhea, sore throat or any rash. Patient did state she has a slight cough.. Patient received immunizations on 08/13/17. To Dr. Amil Amen for further direction.

## 2017-08-15 NOTE — Telephone Encounter (Signed)
Spoke with Dr. Amil Amen and patient needs to be seen. Spoke with patient scheduled to comein on tomorrow for quick check up

## 2017-08-15 NOTE — Telephone Encounter (Signed)
Patient called stating had a Pneumococcal and Tetanus Vaccine  on 08/14/17 and after that she is having fever at 102.1 all day with migraine;  Patient stated she also has another symptoms like swollen and really red arm,  and chills. Patient stated her whole body hurts and will like to know if that's normal.  To Cherice to advise

## 2017-08-16 ENCOUNTER — Encounter: Payer: Self-pay | Admitting: Internal Medicine

## 2017-08-16 ENCOUNTER — Ambulatory Visit (INDEPENDENT_AMBULATORY_CARE_PROVIDER_SITE_OTHER): Payer: Medicaid Other | Admitting: Internal Medicine

## 2017-08-16 VITALS — BP 120/80 | HR 66 | Resp 12 | Ht 63.0 in | Wt 220.0 lb

## 2017-08-16 DIAGNOSIS — R509 Fever, unspecified: Secondary | ICD-10-CM | POA: Diagnosis not present

## 2017-08-16 NOTE — Progress Notes (Signed)
   Subjective:    Patient ID: April [name], female    DOB: 06-24-1961, 56 y.o.   MRN: [id number]  HPI  Had Tdap and Pneumovax 2 days ago in clinic.   Awakened the next morning with feeling generalized body aches, soreness, localized redness and swelling in left arm where both injections given.  That swelling has gone down substantially today as well. Developed a fever of 102 a 3 p.m. Yesterday.  Took Fioricet as she was also having a headache.  Took also some Ibuprofen. Awakened this morning without fever.  Was sweating like crazy very early this morning. Generalized aching and headache is definitely better.   Did have a bit of a cough and green nasal drainage as well with this.  Current Meds  Medication Sig  . anastrozole (ARIMIDEX) 1 MG tablet Take 1 tablet (1 mg total) by mouth daily. (Patient taking differently: Take 1 mg by mouth daily. )  . b complex vitamins tablet Take 1 tablet by mouth daily.  . butalbital-acetaminophen-caffeine (FIORICET, ESGIC) 50-325-40 MG tablet TAKE 2 TABLETS BY MOUTH EVERY 6 HOURS AS NEEDED FOR HEADACHE/MIGRAINE  . Calcium Carb-Cholecalciferol (CALCIUM/VITAMIN D) 600-400 MG-UNIT TABS Take 2 tablets by mouth daily. Reports starting January 2018  . DULoxetine (CYMBALTA) 60 MG capsule Take 1 capsule (60 mg total) by mouth daily. Take along with 30mg  capsule for a total of 90mg  of cymbalta per day. (Patient taking differently: Take 60 mg by mouth daily. )  . ibuprofen (ADVIL,MOTRIN) 800 MG tablet TAKE 1 TABLET BY MOUTH EVERY 8 HOURS AS NEEDED.  Marland Kitchen loratadine (CLARITIN) 10 MG tablet Take 10 mg by mouth daily.  . mometasone (NASONEX) 50 MCG/ACT nasal spray 2 sprays each nostril daily  . pregabalin (LYRICA) 75 MG capsule Take 1 capsule (75 mg total) by mouth 2 (two) times daily.  . ranitidine (ZANTAC) 75 MG tablet Take 75 mg by mouth daily.   . valACYclovir (VALTREX) 1000 MG tablet Take 0.5 tablets (500 mg total) by mouth 2 (two) times daily.    Review of  Systems     Objective:   Physical Exam NAD HEENT:  PERRL, EOMI, TMs pearly gray, throat without injection or exudate,  Nasal mucosa with mild erythema and swelling.  NT over frontal and maxillary sinuses Neck:  Supple, No adenopathy Chest:  CTA CV:  RRR without murmur or rub,  Radial pulses normal and equal.  Good cap refill Abd:  S, NT, No HSM or masses, + BS Skin:  Mild localized erythema and swelling about immunization area of left deltoid area.  No significant in warmth.       Assessment & Plan:  1.  Febrile illness:  Likely viral syndrome with URI symptoms. Symptomatic care.    2.  Local reaction to one of her two immunizations:  Tdap or Pneumococcal 23 v.  In future, discussed getting one shot in arm and the other in thigh to clarify what she might be reacting to, though currently looks minor.  Doubt the fever related to the immunizations, but cannot completely rule out.

## 2017-08-16 NOTE — Patient Instructions (Signed)
Call over the weekend if you get worse or have new symptoms

## 2017-08-17 LAB — CBC WITH DIFFERENTIAL/PLATELET
BASOS ABS: 0 10*3/uL (ref 0.0–0.2)
Basos: 0 %
EOS (ABSOLUTE): 0.1 10*3/uL (ref 0.0–0.4)
EOS: 1 %
HEMATOCRIT: 39.9 % (ref 34.0–46.6)
HEMOGLOBIN: 13.9 g/dL (ref 11.1–15.9)
IMMATURE GRANULOCYTES: 0 %
Immature Grans (Abs): 0 10*3/uL (ref 0.0–0.1)
Lymphocytes Absolute: 0.9 10*3/uL (ref 0.7–3.1)
Lymphs: 10 %
MCH: 33.7 pg — ABNORMAL HIGH (ref 26.6–33.0)
MCHC: 34.8 g/dL (ref 31.5–35.7)
MCV: 97 fL (ref 79–97)
MONOCYTES: 10 %
Monocytes Absolute: 0.9 10*3/uL (ref 0.1–0.9)
NEUTROS PCT: 79 %
Neutrophils Absolute: 6.9 10*3/uL (ref 1.4–7.0)
Platelets: 246 10*3/uL (ref 150–379)
RBC: 4.13 x10E6/uL (ref 3.77–5.28)
RDW: 15.9 % — ABNORMAL HIGH (ref 12.3–15.4)
WBC: 8.8 10*3/uL (ref 3.4–10.8)

## 2017-09-04 ENCOUNTER — Other Ambulatory Visit: Payer: Self-pay | Admitting: Oncology

## 2017-09-07 ENCOUNTER — Other Ambulatory Visit: Payer: Self-pay | Admitting: *Deleted

## 2017-09-07 DIAGNOSIS — Z170 Estrogen receptor positive status [ER+]: Principal | ICD-10-CM

## 2017-09-07 DIAGNOSIS — C50411 Malignant neoplasm of upper-outer quadrant of right female breast: Secondary | ICD-10-CM

## 2017-09-19 ENCOUNTER — Encounter (HOSPITAL_COMMUNITY): Payer: Self-pay

## 2017-09-26 ENCOUNTER — Other Ambulatory Visit: Payer: Self-pay | Admitting: Oncology

## 2017-09-26 ENCOUNTER — Encounter: Payer: Self-pay | Admitting: *Deleted

## 2017-09-26 DIAGNOSIS — C50411 Malignant neoplasm of upper-outer quadrant of right female breast: Secondary | ICD-10-CM

## 2017-09-26 DIAGNOSIS — Z170 Estrogen receptor positive status [ER+]: Principal | ICD-10-CM

## 2017-09-26 NOTE — Progress Notes (Signed)
Addendum added for MD to cosign visit note. Mauri Reading Michaelle Copas Therapist, sports, BSN Clinical Research Nurse 09/26/17 @ [phone]

## 2017-09-27 ENCOUNTER — Other Ambulatory Visit: Payer: Self-pay | Admitting: *Deleted

## 2017-09-27 DIAGNOSIS — C50411 Malignant neoplasm of upper-outer quadrant of right female breast: Secondary | ICD-10-CM

## 2017-09-27 DIAGNOSIS — Z170 Estrogen receptor positive status [ER+]: Principal | ICD-10-CM

## 2017-10-20 ENCOUNTER — Other Ambulatory Visit: Payer: Self-pay | Admitting: Oncology

## 2017-10-22 ENCOUNTER — Telehealth: Payer: Self-pay | Admitting: *Deleted
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
## 2017-10-22 NOTE — Telephone Encounter (Signed)
Left voice mail and sent secure email reminding patient to bring her medication diaries in for her Cycle 6 visit on 10/23/17. Mauri Reading Michaelle Copas, Therapist, sports, Product/process development scientist

## 2017-10-23 ENCOUNTER — Ambulatory Visit (HOSPITAL_BASED_OUTPATIENT_CLINIC_OR_DEPARTMENT_OTHER): Payer: Medicaid Other | Admitting: Oncology

## 2017-10-23 ENCOUNTER — Telehealth: Payer: Self-pay | Admitting: Oncology

## 2017-10-23 ENCOUNTER — Encounter: Payer: Self-pay | Admitting: *Deleted

## 2017-10-23 ENCOUNTER — Other Ambulatory Visit (HOSPITAL_BASED_OUTPATIENT_CLINIC_OR_DEPARTMENT_OTHER): Payer: Medicaid Other

## 2017-10-23 VITALS — BP 117/85 | HR 85 | Temp 98.3°F | Resp 20 | Ht 63.0 in | Wt 221.5 lb

## 2017-10-23 DIAGNOSIS — R6889 Other general symptoms and signs: Secondary | ICD-10-CM

## 2017-10-23 DIAGNOSIS — Z170 Estrogen receptor positive status [ER+]: Secondary | ICD-10-CM

## 2017-10-23 DIAGNOSIS — Z006 Encounter for examination for normal comparison and control in clinical research program: Secondary | ICD-10-CM

## 2017-10-23 DIAGNOSIS — Z9221 Personal history of antineoplastic chemotherapy: Secondary | ICD-10-CM | POA: Diagnosis not present

## 2017-10-23 DIAGNOSIS — C50911 Malignant neoplasm of unspecified site of right female breast: Secondary | ICD-10-CM

## 2017-10-23 DIAGNOSIS — Z79811 Long term (current) use of aromatase inhibitors: Secondary | ICD-10-CM

## 2017-10-23 DIAGNOSIS — C50411 Malignant neoplasm of upper-outer quadrant of right female breast: Secondary | ICD-10-CM

## 2017-10-23 DIAGNOSIS — Z923 Personal history of irradiation: Secondary | ICD-10-CM | POA: Diagnosis not present

## 2017-10-23 DIAGNOSIS — C773 Secondary and unspecified malignant neoplasm of axilla and upper limb lymph nodes: Secondary | ICD-10-CM

## 2017-10-23 LAB — CBC WITH DIFFERENTIAL/PLATELET
BASO%: 0.5 % (ref 0.0–2.0)
Basophils Absolute: 0 10*3/uL (ref 0.0–0.1)
EOS%: 3.5 % (ref 0.0–7.0)
Eosinophils Absolute: 0.1 10*3/uL (ref 0.0–0.5)
HCT: 42 % (ref 34.8–46.6)
HGB: 14.2 g/dL (ref 11.6–15.9)
LYMPH%: 16.4 % (ref 14.0–49.7)
MCH: 33.2 pg (ref 25.1–34.0)
MCHC: 33.7 g/dL (ref 31.5–36.0)
MCV: 98.4 fL (ref 79.5–101.0)
MONO#: 0.4 10*3/uL (ref 0.1–0.9)
MONO%: 10.5 % (ref 0.0–14.0)
NEUT#: 2.5 10*3/uL (ref 1.5–6.5)
NEUT%: 69.1 % (ref 38.4–76.8)
PLATELETS: 282 10*3/uL (ref 145–400)
RBC: 4.27 10*6/uL (ref 3.70–5.45)
RDW: 13.6 % (ref 11.2–14.5)
WBC: 3.6 10*3/uL — ABNORMAL LOW (ref 3.9–10.3)
lymph#: 0.6 10*3/uL — ABNORMAL LOW (ref 0.9–3.3)

## 2017-10-23 LAB — COMPREHENSIVE METABOLIC PANEL
ALT: 19 U/L (ref 0–55)
ANION GAP: 8 meq/L (ref 3–11)
AST: 17 U/L (ref 5–34)
Albumin: 3.6 g/dL (ref 3.5–5.0)
Alkaline Phosphatase: 134 U/L (ref 40–150)
BUN: 8.9 mg/dL (ref 7.0–26.0)
CHLORIDE: 107 meq/L (ref 98–109)
CO2: 25 meq/L (ref 22–29)
CREATININE: 0.8 mg/dL (ref 0.6–1.1)
Calcium: 9.9 mg/dL (ref 8.4–10.4)
EGFR: >60 mL/min/{1.73_m2} (ref >60)
Glucose: 89 mg/dl (ref 70–140)
POTASSIUM: 4 meq/L (ref 3.5–5.1)
Sodium: 140 mEq/L (ref 136–145)
Total Bilirubin: 0.38 mg/dL (ref 0.20–1.20)
Total Protein: 7.4 g/dL (ref 6.4–8.3)

## 2017-10-23 LAB — RESEARCH LABS

## 2017-10-23 NOTE — Progress Notes (Signed)
10/23/2017 @ 1100--AFT-05 [name]: Cycle 6, Day 1 Visit: Patient presented to clinic unaccompanied and was given PRO Questionnaires to complete by research specialist, Remer Macho. Labs (CBC,Cmet,A1-C,Research sample) collected. CBC/Cmet showed no clinically significant abnormalities. Her Grade 1 hypoalbuminemia has resolved today. Medication list was reviewed and updated. Stopped her cough medication on 08/13/17,  and was started on Lyrica at that time for baseline neuropathy/paresthesia in fingers and toes that she tells research nurse has not worsened since starting her anastrozole. Her PRO Questionnaires were reviewed for completeness by research nurse. Radiation pneumonitis (Grade 1) noted on CT 07/27/17 is currently asymptomatic. Her cough (Grade 2) noted on 06/20/17 has was grade 1 at PCP visit of 08/15/17 and she reports it resolved "about a month ago". She does reports she is still vaping on occasion, but not smoking cigarettes. Malaise noted on 07/04/17 has resolved about a month ago as well according to patient. She continues to report intermittent periods (lasting a few days) of generalized muscle aches/myalgia (Grade 2) that she informs research nurse that she has had since fibromyalgia diagnosis in 2013. She reports the right back pain reported in July resolved about a month later, but she continues to have her baseline (Grade 1) lower back pain of which is hurting today. Changing position or improving posture helps the pain. Continued intermittent baseline headaches/migranes continue of which she has one today. Ibuprofen, massaging her neck and temples w/occasional dose of Fioricet helps. Tells RN she feels it is stress related due to legal strain regarding her divorce, finances and caring for her mother. Saw her PCP on 08/15/17 with reports of fever (Grade 1) and chills (Grade 1) that resolved in 24 hours. Had Tdap and Pneumococcal vaccine in same arm on 08/13/17 that resulted in a injection site reaction on  08/15/17 (Grade 1) that she reports resolved in 1 week. Dr. 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 Dr. Jana Hakim examined patient and observed a excoriated rash on her upper back and arms--she reports the recent outbreak came on 10/13/17 and itches. This is a baseline issue that she reports she has had since 08/2011 and she has seen a dermatologist in past w/biopsy that showed "nothing". The rash will usually run its course in 3 weeks. She plans on seeing her PCP to see if she may need referral to allergist. Says she is frustrated and tired of this issue. Her baseline hypertension (Grade 2) is better today at 117/85. Weight is stable. Patient has had no adverse events related to her anastrozole and continues to obtain medication at a reasonable price.  Patient brought in her cycle 4 and cycle 5 medication diaries today (mailed in cycle 3 earlier). Denies any missed doses and none were noted in diaries. She was given her anti-hormone diaries for Cycle 6-8 to complete. She will be seen again to begin Cycle #9 on 01/15/18. She had no questions for the research nurse. She was thanked for her continued participation in study. Mauri Reading Michaelle Copas, RN, BSN Clinical Research Nurse 10/23/17 @ [facility] [phone]  10/23/2017   Study/Protocol: Rogene [facility] Cycle: 3-5 Adverse Events (07/31/17-10/22/17)  Event Grade Onset Date Resolved Date Attribution to Anastrozole Treatment Comments   Back Pain  2  06/25/17     07/26/17  No Ordered x-rays of T-S spine Increased from baseline grade 1 back pain   Breast Pain (R)-int.  1  07/03/17  ongoing  No        None Occasional sharp pain-brief  Cough 2 06/20/17   08/15/17 No Z-pack, Prednisone, Codeine w/Phenergan cough syrup   Malaise 1 07/04/17 10/23/17 No     Hypoalbuminemia 1 07/24/17 10/23/17 Yes    Arthritis-both  great toes 1 08/13/17 ongoing No None    Fever                1 08/15/17 08/16/17 No None   Chills 1 08/15/17 08/16/17 No None    Injection site reachtion 1 08/15/17 08/22/17 No None   Radiation  Pneumonitis 1 07/27/17 ongoing No None Per CT scan  Cough  1 08/15/17 09/22/17 No None   Manuela Schwartz L. Michaelle Copas Therapist, sports, BSN Clinical Research Nurse 10/23/17 @ [phone]

## 2017-10-23 NOTE — Telephone Encounter (Signed)
Scheduled appt per 11/20 los - patient did not want calendar.

## 2017-10-24 ENCOUNTER — Encounter: Payer: Self-pay | Admitting: Internal Medicine

## 2017-10-24 ENCOUNTER — Ambulatory Visit: Payer: Medicaid Other | Admitting: Internal Medicine

## 2017-10-24 VITALS — BP 122/86 | HR 72 | Resp 12 | Ht 63.0 in | Wt 222.0 lb

## 2017-10-24 DIAGNOSIS — R21 Rash and other nonspecific skin eruption: Secondary | ICD-10-CM

## 2017-10-24 DIAGNOSIS — M797 Fibromyalgia: Secondary | ICD-10-CM

## 2017-10-24 DIAGNOSIS — Z23 Encounter for immunization: Secondary | ICD-10-CM

## 2017-10-24 LAB — HEMOGLOBIN A1C
ESTIMATED AVERAGE GLUCOSE: 108 mg/dL
HEMOGLOBIN A1C: 5.4 % (ref 4.8–5.6)

## 2017-10-24 MED ORDER — CLOBETASOL PROPIONATE 0.05 % EX CREA: TOPICAL_CREAM | CUTANEOUS | 1 refills | Status: DC

## 2017-10-24 MED ORDER — FEXOFENADINE HCL 180 MG PO TABS: 180.0000 mg | ORAL_TABLET | Freq: Every day | ORAL | 11 refills | Status: DC

## 2017-10-24 MED ORDER — PREGABALIN 150 MG PO CAPS: 150.0000 mg | ORAL_CAPSULE | Freq: Two times a day (BID) | ORAL | 11 refills | Status: DC

## 2017-10-24 NOTE — Patient Instructions (Signed)
Keep appointment in December and we can test your memory then.

## 2017-10-24 NOTE — Progress Notes (Signed)
   Subjective:    Patient ID: April [name], female    DOB: 09-07-1961, 56 y.o.   MRN: [id number]  HPI   1.  Rash:  Same as has had before.  She was not on any medication when this first started.   Felt at one point to be more a neurodermatitis.  Started on Lyrica for fibromyalgia pain the 10th of September.  Rash started 3 weeks ago.   Had this similar rash starting in 2012.  Did have biopsies x 2 at [facility]--maybe Dr. Allyson Sabal.  Results were non specific. Patient does not feel the rash is due to the Lyrica.  Unable to get a clear answer as to why she feel this way.  2.  Chronic pain/ peripheral neuropathic pain of hands and feet and generalized achiness.  She may have some mild improvement in symptoms with Lyrica. Also continues on Cymbalta.    Current Meds  Medication Sig  . anastrozole (ARIMIDEX) 1 MG tablet Take 1 tablet (1 mg total) by mouth daily. (Patient taking differently: Take 1 mg by mouth daily. )  . b complex vitamins tablet Take 1 tablet by mouth daily.  . butalbital-acetaminophen-caffeine (FIORICET, ESGIC) 50-325-40 MG tablet TAKE 2 TABLETS BY MOUTH EVERY 6 HOURS AS NEEDED FOR HEADACHE/MIGRAINE  . Calcium Carb-Cholecalciferol (CALCIUM/VITAMIN D) 600-400 MG-UNIT TABS Take 2 tablets by mouth daily. Reports starting January 2018  . mometasone (NASONEX) 50 MCG/ACT nasal spray 2 sprays each nostril daily  . pregabalin (LYRICA) 150 MG capsule Take 1 capsule (150 mg total) by mouth 2 (two) times daily.  . ranitidine (ZANTAC) 75 MG tablet Take 75 mg by mouth daily.   . valACYclovir (VALTREX) 1000 MG tablet Take 0.5 tablets (500 mg total) by mouth 2 (two) times daily.  . [DISCONTINUED] DULoxetine (CYMBALTA) 60 MG capsule TAKE 1 CAPSULE BY MOUTH ONCE DAILY (TAKE ALONG WITH 30MG  CAPSULE FOR A TOTAL OF 90MG  OF CYMBALTA PER DAY)  . [DISCONTINUED] loratadine (CLARITIN) 10 MG tablet Take 10 mg by mouth daily.  . [DISCONTINUED] pregabalin (LYRICA) 75 MG capsule Take 1 capsule  (75 mg total) by mouth 2 (two) times daily.       Review of Systems     Objective:   Physical Exam NAD Lungs:  CTA CV:  RRR without murmur or rub Skin:  Circular scratched lesions generally about 1/2 cm in diameter.  Most are scabbed over.  Most on arms and upper back, some upper buttocks/low back.       Assessment & Plan:  1.  Pruritic lesions:  Not clear if some sort of drug eruption related to Lyrica.  Patient has had the lesions in the past even when not taking Gabapentin.   She would like to continue the Lyrica. Referral to Dermatology to further investigate.  2.  Chronic pain:  Increase Lyrica to 150 mg twice daily.  3.  HM:  Influenza vaccine.

## 2017-10-30 ENCOUNTER — Other Ambulatory Visit: Payer: Self-pay

## 2017-11-05 ENCOUNTER — Other Ambulatory Visit: Payer: Self-pay | Admitting: Oncology

## 2017-11-12 ENCOUNTER — Ambulatory Visit: Payer: Medicaid Other | Admitting: Internal Medicine

## 2017-12-07 ENCOUNTER — Ambulatory Visit: Payer: Medicaid Other | Admitting: Internal Medicine

## 2017-12-10 ENCOUNTER — Other Ambulatory Visit (INDEPENDENT_AMBULATORY_CARE_PROVIDER_SITE_OTHER): Payer: Self-pay

## 2017-12-10 ENCOUNTER — Telehealth: Payer: Self-pay | Admitting: Internal Medicine

## 2017-12-10 DIAGNOSIS — D72819 Decreased white blood cell count, unspecified: Secondary | ICD-10-CM

## 2017-12-10 NOTE — Telephone Encounter (Signed)
Patient would like Dr. Amil Amen to call with regards to lab results she received in my chart.  Would like to discuss with Dr. Victoriano Lain  [phone]

## 2017-12-10 NOTE — Telephone Encounter (Signed)
Spoke with patient. Patient states question was in regards to her daughter and not for herself. Informed patient she would need to contact daughters PCP

## 2017-12-11 LAB — CBC WITH DIFFERENTIAL/PLATELET
Basophils Absolute: 0 10*3/uL (ref 0.0–0.2)
Basos: 0 %
EOS (ABSOLUTE): 0.1 10*3/uL (ref 0.0–0.4)
EOS: 3 %
HEMATOCRIT: 40.4 % (ref 34.0–46.6)
Hemoglobin: 13.9 g/dL (ref 11.1–15.9)
IMMATURE GRANS (ABS): 0 10*3/uL (ref 0.0–0.1)
IMMATURE GRANULOCYTES: 0 %
LYMPHS: 15 %
Lymphocytes Absolute: 0.7 10*3/uL (ref 0.7–3.1)
MCH: 33.5 pg — ABNORMAL HIGH (ref 26.6–33.0)
MCHC: 34.4 g/dL (ref 31.5–35.7)
MCV: 97 fL (ref 79–97)
MONOCYTES: 9 %
Monocytes Absolute: 0.4 10*3/uL (ref 0.1–0.9)
NEUTROS PCT: 73 %
Neutrophils Absolute: 3.3 10*3/uL (ref 1.4–7.0)
Platelets: 266 10*3/uL (ref 150–379)
RBC: 4.15 x10E6/uL (ref 3.77–5.28)
RDW: 14.7 % (ref 12.3–15.4)
WBC: 4.5 10*3/uL (ref 3.4–10.8)

## 2017-12-18 ENCOUNTER — Telehealth: Payer: Self-pay | Admitting: *Deleted

## 2017-12-18 NOTE — Telephone Encounter (Signed)
Patient sent research nurse an email with her new telephone number and requests it be added to her demographic information. She prefers to have this number called as 1st priority.

## 2018-01-07 ENCOUNTER — Other Ambulatory Visit: Payer: Self-pay | Admitting: Oncology

## 2018-01-08 ENCOUNTER — Encounter: Payer: Self-pay | Admitting: Internal Medicine

## 2018-01-08 ENCOUNTER — Ambulatory Visit: Payer: Medicaid Other | Admitting: Internal Medicine

## 2018-01-08 VITALS — BP 124/80 | HR 80 | Resp 12 | Ht 63.0 in | Wt 222.0 lb

## 2018-01-08 DIAGNOSIS — R748 Abnormal levels of other serum enzymes: Secondary | ICD-10-CM

## 2018-01-08 DIAGNOSIS — J3089 Other allergic rhinitis: Secondary | ICD-10-CM

## 2018-01-08 DIAGNOSIS — R5382 Chronic fatigue, unspecified: Secondary | ICD-10-CM

## 2018-01-08 DIAGNOSIS — R21 Rash and other nonspecific skin eruption: Secondary | ICD-10-CM

## 2018-01-08 DIAGNOSIS — F329 Major depressive disorder, single episode, unspecified: Secondary | ICD-10-CM

## 2018-01-08 DIAGNOSIS — R42 Dizziness and giddiness: Secondary | ICD-10-CM

## 2018-01-08 DIAGNOSIS — F32A Depression, unspecified: Secondary | ICD-10-CM

## 2018-01-08 DIAGNOSIS — G894 Chronic pain syndrome: Secondary | ICD-10-CM

## 2018-01-08 MED ORDER — MOMETASONE FUROATE 50 MCG/ACT NA SUSP: NASAL | 12 refills | Status: DC

## 2018-01-08 NOTE — Progress Notes (Signed)
Subjective:    Patient ID: April [name], female    DOB: [birth year], 57 y.o.   MRN: [id number]  HPI   1.  Skin eruption:  Patient now as bad as ever.  Was seen by Dr. Threasa Alpha, Dermatology at [facility].  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   She is unhappy with the diagnosis, but discussed with her the process of neurotic derm and perhaps she has something eliciting itching initially, but her scratching could be maintaining the pruritis. We have not had her evaluated by allergy to see if this could be part of her condition.  2.  Chronic pain/peripheral neuropathic pain of hands and feet.  No real improvement with increase in Lyrica.  Tired all the time.  Has had a couple episodes of feeling light headed, like would pass out.  First episode was 4 days ago and one occurred today.  She would sit back down and still feel dizzy for a bit.  No palpitations.  Has just not been feeling well with allergies as well..  Having headaches at nuchal area.    3.  Eyes dry in the morning, sinuses are draining, sneezing a lot.  Has been using Visine eyedrops for the eye dryness.  Lot of clear nasal drainage.  Frontal headaches.  Current Meds  Medication Sig  . anastrozole (ARIMIDEX) 1 MG tablet Take 1 tablet (1 mg total) by mouth daily. (Patient taking differently: Take 1 mg by mouth daily. )  . Calcium Carb-Cholecalciferol (CALCIUM/VITAMIN D) 600-400 MG-UNIT TABS Take 2 tablets by mouth daily. Reports starting January 2018  . cetirizine (ZYRTEC) 10 MG tablet Take 10 mg by mouth daily.  . DULoxetine (CYMBALTA) 60 MG capsule TAKE 1 CAPSULE BY MOUTH ONCE DAILY (TAKE ALONG WITH 30MG  CAPSULE FOR A TOTAL OF 90MG  OF CYMBALTA PER DAY)  . pregabalin (LYRICA) 150 MG capsule Take 1 capsule (150 mg total) by mouth 2 (two) times daily.  . ranitidine (ZANTAC) 75 MG tablet Take 75 mg by mouth daily.   . valACYclovir (VALTREX) 1000 MG  tablet Take 0.5 tablets (500 mg total) by mouth 2 (two) times daily.  . [DISCONTINUED] mometasone (NASONEX) 50 MCG/ACT nasal spray 2 sprays each nostril daily   Allergies  Allergen Reactions  . Hydrocodone Nausea Only and Other (See Comments)    dizziness  . Ultram [Tramadol Hcl] Nausea Only  . Gabapentin Rash     Review of Systems     Objective:   Physical Exam  NAD Skin:  Multiple scabbed over and scratched lesions on arms and upper back.  Upper back with scratch mark scarring. HEENT:  PERRL, EOMI, conjunctivae without injection.  TMs pearly gray, nasal mucosa swollen and boggy with clear discharge. Throat with mild cobbling.  Mildly tender over sinuses. Neck:  Supple, No adenopathy.  Tender over traps bilaterally to cervical paraspinous musculature. Chest:  CTA CV:  RRR without murmur or rub. Radial and DP pulses normal and equal Abd:  S, NT, No HSM or mass, + BS LE:  No edema        Assessment & Plan:  1.  Chronic pain/neuropathic pain and depression:  .   Continue Lyrica and Cymbalta for now.   Will ask Macie Burows, LCSW to call and see if some techniques for relaxation, work on depression  2.  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  Now being followed by  Dermatology.  Completing Keflex for possible folliculitis as well. Consider Allergy referral.  3.  Seasonal and environmental allergies:  Feel this is likely cause of her fatigue, headache and possibly light headedness as well. Stop Allegra and start Cetirizine. Nasonex 2 sprays each nostril daily. However, with all of her symptoms, and history of breast cancer, check CMP, CBC

## 2018-01-08 NOTE — Patient Instructions (Signed)
Warm pack you neck at bedtime and then perform isometric strengthening and stretches as discussed

## 2018-01-09 LAB — COMPREHENSIVE METABOLIC PANEL
A/G RATIO: 1.7 (ref 1.2–2.2)
ALK PHOS: 145 IU/L — AB (ref 39–117)
ALT: 13 IU/L (ref 0–32)
AST: 16 IU/L (ref 0–40)
Albumin: 4.2 g/dL (ref 3.5–5.5)
BILIRUBIN TOTAL: 0.3 mg/dL (ref 0.0–1.2)
BUN / CREAT RATIO: 9 (ref 9–23)
BUN: 7 mg/dL (ref 6–24)
CHLORIDE: 103 mmol/L (ref 96–106)
CO2: 23 mmol/L (ref 20–29)
Calcium: 9.7 mg/dL (ref 8.7–10.2)
Creatinine, Ser: 0.77 mg/dL (ref 0.57–1.00)
GFR calc non Af Amer: 87 mL/min/{1.73_m2} (ref >59)
GFR, EST AFRICAN AMERICAN: 101 mL/min/{1.73_m2} (ref >59)
GLUCOSE: 82 mg/dL (ref 65–99)
Globulin, Total: 2.5 g/dL (ref 1.5–4.5)
POTASSIUM: 4.8 mmol/L (ref 3.5–5.2)
Sodium: 142 mmol/L (ref 134–144)
TOTAL PROTEIN: 6.7 g/dL (ref 6.0–8.5)

## 2018-01-09 LAB — CBC WITH DIFFERENTIAL/PLATELET
BASOS ABS: 0 10*3/uL (ref 0.0–0.2)
BASOS: 0 %
EOS (ABSOLUTE): 0.1 10*3/uL (ref 0.0–0.4)
Eos: 3 %
Hematocrit: 41.5 % (ref 34.0–46.6)
Hemoglobin: 14.2 g/dL (ref 11.1–15.9)
IMMATURE GRANS (ABS): 0 10*3/uL (ref 0.0–0.1)
Immature Granulocytes: 0 %
LYMPHS ABS: 0.8 10*3/uL (ref 0.7–3.1)
LYMPHS: 17 %
MCH: 33.5 pg — AB (ref 26.6–33.0)
MCHC: 34.2 g/dL (ref 31.5–35.7)
MCV: 98 fL — AB (ref 79–97)
Monocytes Absolute: 0.3 10*3/uL (ref 0.1–0.9)
Monocytes: 7 %
NEUTROS ABS: 3.4 10*3/uL (ref 1.4–7.0)
Neutrophils: 73 %
Platelets: 302 10*3/uL (ref 150–379)
RBC: 4.24 x10E6/uL (ref 3.77–5.28)
RDW: 14.6 % (ref 12.3–15.4)
WBC: 4.6 10*3/uL (ref 3.4–10.8)

## 2018-01-10 ENCOUNTER — Other Ambulatory Visit: Payer: Self-pay | Admitting: *Deleted

## 2018-01-10 DIAGNOSIS — C50411 Malignant neoplasm of upper-outer quadrant of right female breast: Secondary | ICD-10-CM

## 2018-01-10 DIAGNOSIS — Z170 Estrogen receptor positive status [ER+]: Principal | ICD-10-CM

## 2018-01-12 LAB — SPECIMEN STATUS REPORT

## 2018-01-12 LAB — GAMMA GT: GGT: 18 IU/L (ref 0–60)
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
## 2018-01-15 ENCOUNTER — Telehealth: Payer: Self-pay | Admitting: Oncology

## 2018-01-15 ENCOUNTER — Inpatient Hospital Stay: Payer: Medicaid Other | Attending: Adult Health | Admitting: Oncology

## 2018-01-15 ENCOUNTER — Encounter: Payer: Self-pay | Admitting: *Deleted

## 2018-01-15 ENCOUNTER — Inpatient Hospital Stay: Payer: Medicaid Other

## 2018-01-15 VITALS — BP 132/94 | HR 91 | Temp 99.4°F | Resp 18 | Ht 63.0 in | Wt 226.6 lb

## 2018-01-15 DIAGNOSIS — C773 Secondary and unspecified malignant neoplasm of axilla and upper limb lymph nodes: Secondary | ICD-10-CM

## 2018-01-15 DIAGNOSIS — Z923 Personal history of irradiation: Secondary | ICD-10-CM | POA: Insufficient documentation

## 2018-01-15 DIAGNOSIS — R42 Dizziness and giddiness: Secondary | ICD-10-CM | POA: Diagnosis not present

## 2018-01-15 DIAGNOSIS — F329 Major depressive disorder, single episode, unspecified: Secondary | ICD-10-CM

## 2018-01-15 DIAGNOSIS — C50411 Malignant neoplasm of upper-outer quadrant of right female breast: Secondary | ICD-10-CM

## 2018-01-15 DIAGNOSIS — Z170 Estrogen receptor positive status [ER+]: Secondary | ICD-10-CM | POA: Diagnosis not present

## 2018-01-15 DIAGNOSIS — H538 Other visual disturbances: Secondary | ICD-10-CM | POA: Insufficient documentation

## 2018-01-15 DIAGNOSIS — Z006 Encounter for examination for normal comparison and control in clinical research program: Secondary | ICD-10-CM | POA: Insufficient documentation

## 2018-01-15 DIAGNOSIS — M810 Age-related osteoporosis without current pathological fracture: Secondary | ICD-10-CM | POA: Diagnosis not present

## 2018-01-15 DIAGNOSIS — C50919 Malignant neoplasm of unspecified site of unspecified female breast: Secondary | ICD-10-CM

## 2018-01-15 DIAGNOSIS — Z9221 Personal history of antineoplastic chemotherapy: Secondary | ICD-10-CM | POA: Diagnosis not present

## 2018-01-15 DIAGNOSIS — R51 Headache: Secondary | ICD-10-CM | POA: Diagnosis not present

## 2018-01-15 DIAGNOSIS — R110 Nausea: Secondary | ICD-10-CM | POA: Insufficient documentation

## 2018-01-15 LAB — CMP (CANCER CENTER ONLY)
ALBUMIN: 3.6 g/dL (ref 3.5–5.0)
ALT: 10 U/L (ref 0–55)
AST: 13 U/L (ref 5–34)
Alkaline Phosphatase: 129 U/L (ref 40–150)
Anion gap: 8 (ref 3–11)
BUN: 10 mg/dL (ref 7–26)
CO2: 27 mmol/L (ref 22–29)
CREATININE: 0.81 mg/dL (ref 0.60–1.10)
Calcium: 9.8 mg/dL (ref 8.4–10.4)
Chloride: 104 mmol/L (ref 98–109)
GFR, Est AFR Am: >60 mL/min (ref >60)
GLUCOSE: 90 mg/dL (ref 70–140)
Potassium: 4.4 mmol/L (ref 3.5–5.1)
Sodium: 139 mmol/L (ref 136–145)
Total Bilirubin: 0.3 mg/dL (ref 0.2–1.2)
Total Protein: 7.4 g/dL (ref 6.4–8.3)

## 2018-01-15 LAB — CBC WITH DIFFERENTIAL (CANCER CENTER ONLY)
Basophils Absolute: 0 10*3/uL (ref 0.0–0.1)
Basophils Relative: 0 %
EOS ABS: 0.1 10*3/uL (ref 0.0–0.5)
EOS PCT: 3 %
HCT: 41.7 % (ref 34.8–46.6)
Hemoglobin: 14.2 g/dL (ref 11.6–15.9)
Lymphocytes Relative: 16 %
Lymphs Abs: 0.7 10*3/uL — ABNORMAL LOW (ref 0.9–3.3)
MCH: 33.3 pg (ref 25.1–34.0)
MCHC: 34.1 g/dL (ref 31.5–36.0)
MCV: 97.6 fL (ref 79.5–101.0)
MONO ABS: 0.3 10*3/uL (ref 0.1–0.9)
MONOS PCT: 7 %
Neutro Abs: 3.2 10*3/uL (ref 1.5–6.5)
Neutrophils Relative %: 74 %
PLATELETS: 271 10*3/uL (ref 145–400)
RBC: 4.28 MIL/uL (ref 3.70–5.45)
RDW: 13.6 % (ref 11.2–14.5)
WBC: 4.3 10*3/uL (ref 3.9–10.3)

## 2018-01-15 MED ORDER — BUPROPION HCL ER (XL) 150 MG PO TB24: 150.0000 mg | ORAL_TABLET | Freq: Every day | ORAL | 4 refills | Status: DC

## 2018-01-15 NOTE — Progress Notes (Signed)
AFT-05 [name] Study Arm B-Endocrine Therapy Cycle 9, Day 1 Visit Note: Patient presented to clinic today unaccompanied for her lab/OV for cycle #9, day 1. CBC w/diff/Cmet showed no clinically significant abnormalities.Her mild Grade 1 elevation in alkaline phosphatase noted by PCP on 01/08/18 has now resolved. PCP followed this with GGT testing that returned normal. Concomitent med list reviewed and updated with the following changes: A) B Complex vitamin was discontinued per patient "about 6 months ago"-07/2017; B) Keflex started on 10/24/18 for her rash was discontinued by the patient on 01/09/18; C) Allegra/Fexofenadine was never filled due to cost (she continued her Cetirizine); D) Clobetasol/Temovate cream was never filled by patient. Her weight is stable and BP 132/94 remains at baseline (G-2).  She reports to research nurse that she was seen by Dr. Threasa [name], a dermatologist at [facility] on 12/12/17, who evaluated her for her intermittent itiching rash (Grade 2) she has had since 2012. 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 She was ordered a script for Keflex 500 mg tid that she took until she discontinued it on 01/09/18. She was also instructed to try tanning bed, but she has done done this due to cost. She is awaiting a referral to an allergist per Dr. Amil [name]. She clearly has areas of irritation where she has been scratching the outbreak on her arms. She reports Dr. Amil [name] encouraged her change from cetirizine to fexofenadine, but she did not due this due to cost and did not fill the Clobetasol cream and says that dermatologist told her not to apply anything to the rash.  She saw Dr. Amil [name] on 10/24/17 (cycle 6) and due to increased pain in her fingers/feet (Grade 1 at baseline to Grade 2), her Lyrica was increased from 75 mg bid to 150 mg bid. She tells research nurse that she is not able to note an  improvement at this time. Her rash had also flared up again at that time. She was also given a flu vaccine at that visit. Patient reports a 3 week history of increased fatigue to point she feels exhausted, wants to sleep "all the time", can't keep up her housework (Grade 2) with significant malaise (Grade 2); and reports feeling "confusion" intermittently (Grade 1). Her headaches are daily, but controlled with ibuprofen at this time, which is a baseline issue for her with intermittent periods of headaches on a daily basis. She reports intermittent dizziness when she gets up from sofa or chair to stand (Grade 1) and a mild (Grade 1) intermittent nausea. She also reports dry eyes (Grade 1). Reports feeling of her ears having fluid in them (Grade 1) and occcasional sensation that her heart is racing (Grade 1). Also reported some blurred vision when MD inquired (Grade 1)-patient does wear glasses. Continues to ache in her arms and legs as well (baseline Grade 2)-she tells MD she does not think it is due to her anastrozole, however Dr. Jana [name] has instructed her to stop her anastrozole for 1 month and he will see her again to see if the break has helped. If not, he will resume anastrozole, but if yes, will switch to another anti-hormone therapy. She has already taken her cycle 9, day 1 dose today. Research nurse instructed her to hold med until she returns to office and bring her diaries back at that time. MD has also ordered CT brain to R/O any metastasis of her breast cancer. MD reports it  is possible some of her symptoms could be related to the anastrozole.He also suggested some of her symptoms could be related to her depression/fibromyalgia, which can potentially worsen in the winter months, and is going to prescribe a low dose of Wellbutrin. April [name] also informed MD she thinks she felt a lump in her right breast. Per physical exam, MD could not palpate a mass and the patient could not find it today either. Will  continue to monitor. [name] [id number]  01/15/2018   Study/Protocol: [name] Cycle: 6-8 Adverse Events (10/23/17-01/14/18)  Event Grade Onset Date Resolved Date Attribution to Anastrozole Treatment Comments   Breast Pain (R)-int  1  07/03/17     ongoing  No       None Occasional- Sharp pain-brief  Arthritis-both great toes 1 08/13/17   ongoing   No      None   Radiation Pneumonitis 1 07/27/17   ongoing No      None Per CT scan  Fatigue 2 12/25/17   ongoing Yes  Hold anastrozole   Dizziness-int.  1 12/25/17   ongoing No  CT brain ordered  Confusion   1 12/25/17   ongoing No  CT brain ordered  Dry eyes 1 12/25/17   ongoing  Yes Hold anastrozole    Nausea  1 12/25/17   ongoing No     None   Blurred vision  1 12/25/17   ongoing No     None CT brain ordered  Malaise  2 12/25/17   ongoing No     None    Cardiac(other)Heart races-int. 1 12/25/17   ongoing No     None   Elevated  Alkaline phosp 1 01/08/18 01/15/18 No     None   Peripheral Neuropathy 2 08/22/17 ongoing No Lyrica per PCP   Ear: fluid buildup 1 01/25/18 ongoing No    Susan L. Michaelle Copas Therapist, sports, BSN Clinical Research Nurse 01/18/18 @ 1420

## 2018-01-15 NOTE — Telephone Encounter (Signed)
Patient declined AVs and calendar of upcoming March appointments. Patient will receive update in Mychart.

## 2018-01-16 ENCOUNTER — Encounter: Payer: Self-pay | Admitting: Internal Medicine

## 2018-01-16 ENCOUNTER — Encounter: Payer: Self-pay | Admitting: *Deleted

## 2018-01-17 ENCOUNTER — Encounter: Payer: Self-pay | Admitting: Oncology

## 2018-01-18 NOTE — Telephone Encounter (Signed)
Received email from patient regarding "coordination is off balance...."  Called pt back. She reported her head hurts, tingling in hands and feet,  having body aches, in morning is weak and can hardly stand.  Pt reports she stopped anastrozole as instructed by Dr Jana Hakim at recent visit.  Pt is scheduled for CT of brain and prior auth has been completed.  Notified Dr Jana Hakim of pt's symptoms.  Per Dr Jana Hakim, pt can try OTC Aleve and Tylenol TID; wait for results of CT of head as do not think this is cancer related;  Patient can be referred to her PCP, pain clinic, and/or a rheumatologist.  I relayed this message to pt and she said she is taking pain medication as needed and it is helping;  She said she does not want any referrals yet and would like to see what the results of the CT will be.  Encouraged pt to call us back if she has not heard from scheduling by Monday to schedule the CT. Pt voiced understanding.

## 2018-01-22 ENCOUNTER — Ambulatory Visit: Payer: Self-pay | Admitting: Adult Health

## 2018-01-22 ENCOUNTER — Other Ambulatory Visit: Payer: Self-pay

## 2018-01-25 ENCOUNTER — Ambulatory Visit (HOSPITAL_COMMUNITY)
Admission: RE | Admit: 2018-01-25 | Discharge: 2018-01-25 | Disposition: A | Discharge status: Home / Self Care | Payer: Medicaid Other | Source: Ambulatory Visit | Attending: Oncology | Admitting: Oncology

## 2018-01-25 DIAGNOSIS — Z170 Estrogen receptor positive status [ER+]: Secondary | ICD-10-CM | POA: Diagnosis not present

## 2018-01-25 DIAGNOSIS — C773 Secondary and unspecified malignant neoplasm of axilla and upper limb lymph nodes: Secondary | ICD-10-CM | POA: Insufficient documentation

## 2018-01-25 DIAGNOSIS — C50411 Malignant neoplasm of upper-outer quadrant of right female breast: Secondary | ICD-10-CM | POA: Diagnosis not present

## 2018-01-25 DIAGNOSIS — R51 Headache: Secondary | ICD-10-CM | POA: Diagnosis present

## 2018-01-25 DIAGNOSIS — R42 Dizziness and giddiness: Secondary | ICD-10-CM | POA: Insufficient documentation

## 2018-01-25 DIAGNOSIS — C50919 Malignant neoplasm of unspecified site of unspecified female breast: Secondary | ICD-10-CM

## 2018-01-25 MED ORDER — IOPAMIDOL (ISOVUE-300) INJECTION 61%
INTRAVENOUS | Status: DC
Start: 2018-01-25 — End: 2018-01-26
  Filled 2018-01-25: qty 100

## 2018-01-25 MED ORDER — IOPAMIDOL (ISOVUE-300) INJECTION 61%
100.0000 mL | Freq: Once | INTRAVENOUS | Status: AC | PRN
  Administered 2018-01-25: 100 mL via INTRAVENOUS

## 2018-01-26 ENCOUNTER — Encounter: Payer: Self-pay | Admitting: Oncology

## 2018-01-28 ENCOUNTER — Other Ambulatory Visit: Payer: Self-pay | Admitting: *Deleted

## 2018-01-28 DIAGNOSIS — M797 Fibromyalgia: Secondary | ICD-10-CM

## 2018-01-28 DIAGNOSIS — C773 Secondary and unspecified malignant neoplasm of axilla and upper limb lymph nodes: Secondary | ICD-10-CM

## 2018-01-28 DIAGNOSIS — C50919 Malignant neoplasm of unspecified site of unspecified female breast: Secondary | ICD-10-CM

## 2018-02-08 ENCOUNTER — Other Ambulatory Visit: Payer: Self-pay | Admitting: *Deleted

## 2018-02-08 DIAGNOSIS — Z170 Estrogen receptor positive status [ER+]: Principal | ICD-10-CM

## 2018-02-08 DIAGNOSIS — C50411 Malignant neoplasm of upper-outer quadrant of right female breast: Secondary | ICD-10-CM

## 2018-02-10 ENCOUNTER — Encounter: Payer: Self-pay | Admitting: Internal Medicine
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
## 2018-02-11 ENCOUNTER — Inpatient Hospital Stay: Payer: Medicaid Other | Attending: Adult Health | Admitting: Oncology

## 2018-02-11 ENCOUNTER — Inpatient Hospital Stay: Payer: Medicaid Other

## 2018-02-11 ENCOUNTER — Encounter: Payer: Self-pay | Admitting: *Deleted

## 2018-02-11 ENCOUNTER — Telehealth: Payer: Self-pay | Admitting: Oncology

## 2018-02-11 VITALS — BP 131/86 | HR 72 | Temp 97.7°F | Resp 19 | Ht 63.0 in | Wt 228.6 lb

## 2018-02-11 DIAGNOSIS — Z170 Estrogen receptor positive status [ER+]: Principal | ICD-10-CM

## 2018-02-11 DIAGNOSIS — H04129 Dry eye syndrome of unspecified lacrimal gland: Secondary | ICD-10-CM | POA: Diagnosis not present

## 2018-02-11 DIAGNOSIS — Z79811 Long term (current) use of aromatase inhibitors: Secondary | ICD-10-CM | POA: Insufficient documentation

## 2018-02-11 DIAGNOSIS — Z79899 Other long term (current) drug therapy: Secondary | ICD-10-CM | POA: Insufficient documentation

## 2018-02-11 DIAGNOSIS — H538 Other visual disturbances: Secondary | ICD-10-CM | POA: Diagnosis not present

## 2018-02-11 DIAGNOSIS — G629 Polyneuropathy, unspecified: Secondary | ICD-10-CM | POA: Diagnosis not present

## 2018-02-11 DIAGNOSIS — R110 Nausea: Secondary | ICD-10-CM | POA: Insufficient documentation

## 2018-02-11 DIAGNOSIS — R002 Palpitations: Secondary | ICD-10-CM | POA: Insufficient documentation

## 2018-02-11 DIAGNOSIS — R5383 Other fatigue: Secondary | ICD-10-CM | POA: Diagnosis not present

## 2018-02-11 DIAGNOSIS — E669 Obesity, unspecified: Secondary | ICD-10-CM | POA: Insufficient documentation

## 2018-02-11 DIAGNOSIS — M810 Age-related osteoporosis without current pathological fracture: Secondary | ICD-10-CM | POA: Diagnosis not present

## 2018-02-11 DIAGNOSIS — Z8052 Family history of malignant neoplasm of bladder: Secondary | ICD-10-CM

## 2018-02-11 DIAGNOSIS — C50411 Malignant neoplasm of upper-outer quadrant of right female breast: Secondary | ICD-10-CM | POA: Insufficient documentation

## 2018-02-11 DIAGNOSIS — Z006 Encounter for examination for normal comparison and control in clinical research program: Secondary | ICD-10-CM | POA: Diagnosis present

## 2018-02-11 DIAGNOSIS — Z9221 Personal history of antineoplastic chemotherapy: Secondary | ICD-10-CM | POA: Insufficient documentation

## 2018-02-11 DIAGNOSIS — Z923 Personal history of irradiation: Secondary | ICD-10-CM | POA: Diagnosis not present

## 2018-02-11 DIAGNOSIS — Z6841 Body Mass Index (BMI) 40.0 and over, adult: Secondary | ICD-10-CM | POA: Insufficient documentation

## 2018-02-11 DIAGNOSIS — C773 Secondary and unspecified malignant neoplasm of axilla and upper limb lymph nodes: Secondary | ICD-10-CM | POA: Diagnosis not present

## 2018-02-11 DIAGNOSIS — G620 Drug-induced polyneuropathy: Secondary | ICD-10-CM | POA: Insufficient documentation

## 2018-02-11 DIAGNOSIS — C50911 Malignant neoplasm of unspecified site of right female breast: Secondary | ICD-10-CM

## 2018-02-11 DIAGNOSIS — R42 Dizziness and giddiness: Secondary | ICD-10-CM | POA: Diagnosis not present

## 2018-02-11 DIAGNOSIS — R410 Disorientation, unspecified: Secondary | ICD-10-CM | POA: Diagnosis not present

## 2018-02-11 DIAGNOSIS — T451X5A Adverse effect of antineoplastic and immunosuppressive drugs, initial encounter: Secondary | ICD-10-CM

## 2018-02-11 LAB — CMP (CANCER CENTER ONLY)
ALBUMIN: 3.4 g/dL — AB (ref 3.5–5.0)
ALT: 14 U/L (ref 0–55)
AST: 15 U/L (ref 5–34)
Alkaline Phosphatase: 118 U/L (ref 40–150)
Anion gap: 7 (ref 3–11)
BUN: 7 mg/dL (ref 7–26)
CHLORIDE: 105 mmol/L (ref 98–109)
CO2: 27 mmol/L (ref 22–29)
CREATININE: 0.83 mg/dL (ref 0.60–1.10)
Calcium: 9.8 mg/dL (ref 8.4–10.4)
GFR, Est AFR Am: >60 mL/min (ref >60)
GLUCOSE: 93 mg/dL (ref 70–140)
POTASSIUM: 4.4 mmol/L (ref 3.5–5.1)
Sodium: 139 mmol/L (ref 136–145)
Total Bilirubin: 0.3 mg/dL (ref 0.2–1.2)
Total Protein: 7 g/dL (ref 6.4–8.3)

## 2018-02-11 LAB — CBC WITH DIFFERENTIAL (CANCER CENTER ONLY)
Basophils Absolute: 0 10*3/uL (ref 0.0–0.1)
Basophils Relative: 0 %
EOS ABS: 0.1 10*3/uL (ref 0.0–0.5)
EOS PCT: 3 %
HCT: 39.4 % (ref 34.8–46.6)
Hemoglobin: 13.4 g/dL (ref 11.6–15.9)
LYMPHS ABS: 0.7 10*3/uL — AB (ref 0.9–3.3)
LYMPHS PCT: 16 %
MCH: 33.4 pg (ref 25.1–34.0)
MCHC: 33.9 g/dL (ref 31.5–36.0)
MCV: 98.6 fL (ref 79.5–101.0)
MONO ABS: 0.4 10*3/uL (ref 0.1–0.9)
MONOS PCT: 10 %
Neutro Abs: 2.9 10*3/uL (ref 1.5–6.5)
Neutrophils Relative %: 71 %
PLATELETS: 228 10*3/uL (ref 145–400)
RBC: 3.99 MIL/uL (ref 3.70–5.45)
RDW: 13.7 % (ref 11.2–14.5)
WBC: 4.1 10*3/uL (ref 3.9–10.3)

## 2018-02-11 MED ORDER — EXEMESTANE 25 MG PO TABS: 25.0000 mg | ORAL_TABLET | Freq: Every day | ORAL | 12 refills | Status: DC

## 2018-02-11 NOTE — Telephone Encounter (Signed)
Gave avs and calendar for may  °

## 2018-02-11 NOTE — Progress Notes (Signed)
02/11/18 @ 11:00-AFT-05 [name] Study Arm-B Endocrine Therapy Cycle 9/Cycle 10 Visit Note: Patient presented to clinic today unaccompanied for her lab/office visit to begin cycle #10 of endocrine therapy. Cycle #9 was interrupted after one dose due to AEs. CBC w/diff and Cmet today show no clinically significant abnormalities. Concomitant med list reviewed and updated with the following changes: 1. Started Tylenol 650 mg twice daily for persistent daily headaches since around 01/31/18 which she reports as moderate. Is trying this instead of ibuprofen now.  2.She stopped her bupropion and pregabalin on 01/31/18 reporting she sees no improvement in symptoms and feels she is taking too many medications. 3. Started Systane eye drops tid and Visine allergy drops daily to both eyes for worsening dry/itching eyes on 01/16/18. Reports she feels this is related to seasonal allergies and tends to occur with season changes.  4. Has not taken the anastrozole since it was discontinued by provider after 01/15/18 dose. Patient reports to research nurse that she does feel better than she did at last visit. Research nurse asked her rank how she felt at last visit on 1-10 scale and compare today. She reports last visit she was "10" and today she rates her overall condition at "6". Tells nurse that her baseline is "4". Her malaise and fatigue are now grade 1, in that she has felt well enough to do housework and cook and shop again. Feels this improved around 01/31/18. She reports her dizziness, confusion, blurred vision, nausea, heart racing, and sensation of fluid in her ears all resolved around 01/31/18 as well. Her dry eyes/itching continue, so she started the eye drops (Grade 2). She reports no change in her peripheral neuropathy, even with stopping the pregabalin. Reports that she feels swollen in her hands and feet, but research nurse and MD see no visible edema. Her rash as improved since last visit, which tends to come and go  since 2012. Tells RN that her PCP is supposed to be sending her out for allergy testing, but she has not heard from her yet about this referral. Continues to experience generalized myalgias, which most likely are related to her depression and fibromyalgia per Dr. Jana Hakim.  Dr. Jana Hakim in to see and examine patient and has changed her anastrozole to exemestane 25 mg beginning 02/12/18. She will contact research nurse if Medicaid does not cover this medication when she goes to pharmacy. Re check of BP in exam room is 134/83 (Grade 2 at baseline). Patient feels a density her right breast that Dr. Jana Hakim describes as a "shelf" when she is sitting up. Currently of no concern, but will monitor. Her performance status is ECOG "1" per MD. The patient was provided medication calendars for Cycle #10 and Cycle #11 of exemestane 25 mg daily-she will begin Cycle #10 on 02/12/18. Research nurse informed her to take this medication after a meal, preferably breakfast. Reminded her if she is due to start a new cycle on the day she is in the office, hold off on the dose until after she is seen by provider. She understands and agrees. She returned her medication diary for Cycle #9 (only took one dose on 01/15/18).  April [name] [id number]  02/11/2018   Study/Protocol: [name] Cycle: 6-9 Adverse Events (10/23/17-02/11/18)  Event Grade Onset Date Resolved Date Attribution to Anastrozole Treatment Comments   Breast Pain (R)-int  1  07/03/17     ongoing  No       None Occasional- Sharp pain-brief  Arthritis-both great toes  1 08/13/17   ongoing   No      None   Radiation Pneumonitis 1 07/27/17   ongoing No      None Per CT scan  Fatigue 2 12/25/17   01/31/18 Yes  Hold anastrozole   Dizziness-int.  1 12/25/17   01/31/18 No  CT brain ordered-normal  Confusion   1 12/25/17   01/31/18 No  CT brain ordered-normal  Dry eyes 1 12/25/17   01/16/18  Yes Hold anastrozole    Nausea      1 12/25/17   01/31/18 No     None    Blurred vision  1 12/25/17   01/31/18 No     None CT brain ordered-normal  Malaise  2 12/25/17   01/31/18 No     None    Cardiac(other)Heart races-int. 1 12/25/17   01/31/18 No     None   Peripheral Neuropathy 2 08/22/17 ongoing No Lyrica stopped per patient   Ear: fluid buildup 1 01/25/18 01/31/18 No    Fatigue  1 01/31/18 ongoing Yes Changed AI   Malaise  1 01/31/18 ongoing Yes Changed AI   Dry eyes  2 01/16/18 ongoing Yes Eye drops   Headache  2 01/31/18 ongoing No Tylenol Daily-persisting  Patient called research nurse on her way home to report that her Medicaid did cover the exemestane for $3 copay. She will begin taking it on 02/12/18 as discussed. Mauri Reading Michaelle Copas, Therapist, sports, BSN Clinical Research Nurse 02/11/18 @ 1400

## 2018-02-19 ENCOUNTER — Ambulatory Visit: Payer: Medicaid Other | Admitting: Oncology

## 2018-02-19 ENCOUNTER — Other Ambulatory Visit: Payer: Medicaid Other

## 2018-03-12 ENCOUNTER — Other Ambulatory Visit: Payer: Self-pay | Admitting: Oncology

## 2018-03-14 ENCOUNTER — Telehealth: Payer: Self-pay | Admitting: *Deleted

## 2018-03-14 NOTE — Telephone Encounter (Addendum)
03/14/18 @ 0942: Left VM requesting patient call research nurse or email how she is tolerating the exemestane anti-hormone. Mauri Reading Michaelle Copas, RN, BSN Clinical Research Nurse  03/14/18 @ 10:00: Patient returned call and reports she is tolerating the exemestane well. She has felt much better of late and was actually outside working in her flower garden when the research nurse called. Tells nurse that her rash is improved as well. Verbalized she appreciated the follow up call. Dr. Jana Hakim notified. Mauri Reading Michaelle Copas, Therapist, sports, Product/process development scientist
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
## 2018-04-08 ENCOUNTER — Inpatient Hospital Stay: Payer: Medicaid Other

## 2018-04-08 ENCOUNTER — Inpatient Hospital Stay: Payer: Medicaid Other | Attending: Adult Health | Admitting: Oncology

## 2018-04-08 ENCOUNTER — Telehealth: Payer: Self-pay | Admitting: Oncology

## 2018-04-08 ENCOUNTER — Encounter: Payer: Self-pay | Admitting: *Deleted

## 2018-04-08 VITALS — BP 129/90 | HR 81 | Temp 98.9°F | Resp 20 | Ht 63.0 in | Wt 232.0 lb

## 2018-04-08 DIAGNOSIS — M79641 Pain in right hand: Secondary | ICD-10-CM | POA: Insufficient documentation

## 2018-04-08 DIAGNOSIS — M797 Fibromyalgia: Secondary | ICD-10-CM | POA: Diagnosis not present

## 2018-04-08 DIAGNOSIS — C50411 Malignant neoplasm of upper-outer quadrant of right female breast: Secondary | ICD-10-CM | POA: Insufficient documentation

## 2018-04-08 DIAGNOSIS — M79642 Pain in left hand: Secondary | ICD-10-CM | POA: Insufficient documentation

## 2018-04-08 DIAGNOSIS — M810 Age-related osteoporosis without current pathological fracture: Secondary | ICD-10-CM | POA: Diagnosis not present

## 2018-04-08 DIAGNOSIS — Z170 Estrogen receptor positive status [ER+]: Principal | ICD-10-CM

## 2018-04-08 DIAGNOSIS — C773 Secondary and unspecified malignant neoplasm of axilla and upper limb lymph nodes: Secondary | ICD-10-CM

## 2018-04-08 DIAGNOSIS — Z923 Personal history of irradiation: Secondary | ICD-10-CM | POA: Insufficient documentation

## 2018-04-08 DIAGNOSIS — Z006 Encounter for examination for normal comparison and control in clinical research program: Secondary | ICD-10-CM | POA: Insufficient documentation

## 2018-04-08 DIAGNOSIS — Z9221 Personal history of antineoplastic chemotherapy: Secondary | ICD-10-CM | POA: Insufficient documentation

## 2018-04-08 DIAGNOSIS — M79671 Pain in right foot: Secondary | ICD-10-CM

## 2018-04-08 DIAGNOSIS — M79672 Pain in left foot: Secondary | ICD-10-CM | POA: Insufficient documentation

## 2018-04-08 DIAGNOSIS — Z79811 Long term (current) use of aromatase inhibitors: Secondary | ICD-10-CM

## 2018-04-08 DIAGNOSIS — C50919 Malignant neoplasm of unspecified site of unspecified female breast: Secondary | ICD-10-CM

## 2018-04-08 LAB — CBC WITH DIFFERENTIAL (CANCER CENTER ONLY)
BASOS ABS: 0 10*3/uL (ref 0.0–0.1)
BASOS PCT: 0 %
EOS PCT: 5 %
Eosinophils Absolute: 0.2 10*3/uL (ref 0.0–0.5)
HCT: 41.6 % (ref 34.8–46.6)
Hemoglobin: 13.9 g/dL (ref 11.6–15.9)
LYMPHS PCT: 19 %
Lymphs Abs: 0.8 10*3/uL — ABNORMAL LOW (ref 0.9–3.3)
MCH: 32.9 pg (ref 25.1–34.0)
MCHC: 33.4 g/dL (ref 31.5–36.0)
MCV: 98.6 fL (ref 79.5–101.0)
MONO ABS: 0.5 10*3/uL (ref 0.1–0.9)
Monocytes Relative: 11 %
NEUTROS ABS: 2.8 10*3/uL (ref 1.5–6.5)
NEUTROS PCT: 65 %
Platelet Count: 265 10*3/uL (ref 145–400)
RBC: 4.22 MIL/uL (ref 3.70–5.45)
RDW: 14.2 % (ref 11.2–14.5)
WBC: 4.2 10*3/uL (ref 3.9–10.3)

## 2018-04-08 LAB — CMP (CANCER CENTER ONLY)
ALK PHOS: 116 U/L (ref 40–150)
ALT: 13 U/L (ref 0–55)
ANION GAP: 4 (ref 3–11)
AST: 18 U/L (ref 5–34)
Albumin: 3.6 g/dL (ref 3.5–5.0)
BILIRUBIN TOTAL: 0.2 mg/dL (ref 0.2–1.2)
BUN: 10 mg/dL (ref 7–26)
CALCIUM: 9.8 mg/dL (ref 8.4–10.4)
CO2: 29 mmol/L (ref 22–29)
Chloride: 106 mmol/L (ref 98–109)
Creatinine: 0.84 mg/dL (ref 0.60–1.10)
GFR, Estimated: >60 mL/min (ref >60)
Glucose, Bld: 87 mg/dL (ref 70–140)
POTASSIUM: 4.3 mmol/L (ref 3.5–5.1)
Sodium: 139 mmol/L (ref 136–145)
TOTAL PROTEIN: 7.1 g/dL (ref 6.4–8.3)

## 2018-04-08 LAB — HEMOGLOBIN A1C
HEMOGLOBIN A1C: 5.2 % (ref 4.8–5.6)
Mean Plasma Glucose: 102.54 mg/dL

## 2018-04-08 NOTE — Progress Notes (Signed)
AFT-05 [name]: Study Visit for Cycle 12, day 1 04/08/18 @ Patient arrived to [facility] unaccompanied for her cycle 12, day 1 study visit with Dr. Jana Hakim. Patient was provided her questionnaires upon arrival by research assistant to complete. CBC/diff, CMet, Hgb A1C were drawn. CBC and Cmet were normal, except her baseline low absolute lymphocyte count.Weight and V/S obtained with BP 129/90 (baseline grade 1 HTN). Concomitant medication list reviewed with no changes since last visit. She tells research nurse that she is tolerating her exemestane well. Her baseline rash/itching is much improved at this time. Reports grade 1 persistent headaches reported on 01/31/18 have returned to her baseline grade 1 intermittent headaches relieved w/Tylenol. Dry eyes have improved to grade 1 as well, still uses her eyedrops as needed, especially when she works out in the yard. Her feeling of malaise has resolved, however she still reports fatigue. She has been active planting flower and vegetable gardens at her home and enjoys this very much. She tells research nurse that she feels much better in the spring and summer. She has developed a "trigger finger" in her right thumb joint and is unsure as to cause, possibly due to overuse during yard work. She also reports stiffness/pain in joints of her hands that she has had since baseline, but may be slightly worse due to digging and pulling weeds in garden. Tylenol resolves this pain and it improves with rest. Reports tingling and numbness in both arms at night that self resolves. Still has generalized myalgias in her arms and legs, but this is her baseline. She denies any worsening of baseline AEs. Patient returned her medication diary for cycle #10 which demonstrated 100% compliance. She has not taken her last exemestane yet for cycle #11 (takes med at lunch with food), so she will keep this diary and mail back to research nurse once this dose is taken. She was provided medication diaries  for cycles #12-14 and completion directions were provided by research nurse. Dr. Jana Hakim examined patient and completed breast exam with no sign of recurrence noted. He was made aware by research nurse that her mammogram is due in July 2019. Her PCP is currently managing her fibromyalgia symptoms and medications, but with her new issue of paresthesia in arms and right thumb "trigger finger", Dr. Jana Hakim suggested she return to Dr. Bo Merino. Patient agrees to ask her PCP for a referral. PROS/questionnaires were reviewed for completeness by the research nurse prior to patient leaving the office. Shonya will return to office for her Cycle #15 visit on 07/01/2018. Research nurse thanked patient for her continued willingness to participate in the [place] study. Encouraged her to call with any questions or problems related to study or her anti-hormone therapy. Mauri Reading Michaelle Copas, RN, BSN Clinical Research Nurse 04/09/18 @ [address] Rhodia Albright [phone]  04/09/2018   Study/Protocol: [facility] [place] Cycle: 10-11 Adverse Events (02/11/18-04/08/18)  Event Grade Onset Date Resolved Date Attribution to Exemestane Treatment Comments   Breast Pain (R)-int  1  07/03/17   ongoing  No       None Occasional- Sharp pain-brief  Arthritis-both great toes 1 08/13/17 ongoing   No      None   Radiation Pneumonitis 1 07/27/17 ongoing No      None Per CT scan  Fatigue 1 01/31/18 ongoing Yes     Headache  2 01/31/18 04/08/18 No Tramadol or Tylenol prn Daily,persistent  Headache   1 04/08/18 ongoing No Tylenol prn Intermittent  Dry eyes 1 04/08/18 ongoing  Yes Eye drops prn    Dry eyes  2 01/06/18 04/08/18 Yes Eye drops prn   Malaise  2 12/25/17 01/31/18 No     None    Malaise 1 01/31/18 04/08/18 No     Peripheral Neuropathy 2 08/22/17 ongoing No Lyrica stopped per patient   Malaise  1 01/31/18 04/08/18 Yes Changed AI to exemestane   Paresthesia in arms  1 04/08/18 ongoing No MD suggested referral Dr. [name]   Joint  disorder-right thumb  2 04/08/18 ongoing No MD suggested referral Dr. Lupe Carney L. Michaelle Copas, Therapist, sports, Product/process development scientist

## 2018-04-08 NOTE — Telephone Encounter (Signed)
Gave avs and calendar ° °

## 2018-04-09 ENCOUNTER — Telehealth: Payer: Self-pay | Admitting: *Deleted

## 2018-04-09 ENCOUNTER — Ambulatory Visit: Payer: Medicaid Other | Admitting: Internal Medicine

## 2018-04-09 NOTE — Telephone Encounter (Signed)
Called patient to update her on her lab results. She was outside mowing lawn when nurse called. Notified patient of normal HgbA1-C results. Reminded her to mail in her medication diary for cycle #11. Confirmed she still has the self-addressed stamped envelopes given to her by research nurse in the past. She agrees to do so. Mauri Reading Michaelle Copas Therapist, sports, BSN Clinical Research Nurse 04/08/18 @ 1125

## 2018-05-12 ENCOUNTER — Encounter (HOSPITAL_COMMUNITY): Payer: Self-pay | Admitting: Emergency Medicine

## 2018-05-12 ENCOUNTER — Emergency Department (HOSPITAL_COMMUNITY)
Admission: EM | Admit: 2018-05-12 | Discharge: 2018-05-12 | Disposition: A | Discharge status: Home / Self Care | Payer: Medicaid Other | Attending: Emergency Medicine | Admitting: Emergency Medicine

## 2018-05-12 DIAGNOSIS — M62830 Muscle spasm of back: Secondary | ICD-10-CM | POA: Insufficient documentation

## 2018-05-12 DIAGNOSIS — F17210 Nicotine dependence, cigarettes, uncomplicated: Secondary | ICD-10-CM | POA: Insufficient documentation

## 2018-05-12 DIAGNOSIS — R202 Paresthesia of skin: Secondary | ICD-10-CM | POA: Insufficient documentation

## 2018-05-12 DIAGNOSIS — Z79899 Other long term (current) drug therapy: Secondary | ICD-10-CM | POA: Diagnosis not present

## 2018-05-12 DIAGNOSIS — R200 Anesthesia of skin: Secondary | ICD-10-CM | POA: Insufficient documentation

## 2018-05-12 DIAGNOSIS — Z9221 Personal history of antineoplastic chemotherapy: Secondary | ICD-10-CM | POA: Insufficient documentation

## 2018-05-12 DIAGNOSIS — M549 Dorsalgia, unspecified: Secondary | ICD-10-CM | POA: Diagnosis present

## 2018-05-12 DIAGNOSIS — Z853 Personal history of malignant neoplasm of breast: Secondary | ICD-10-CM | POA: Insufficient documentation

## 2018-05-12 DIAGNOSIS — Z923 Personal history of irradiation: Secondary | ICD-10-CM | POA: Diagnosis not present

## 2018-05-12 MED ORDER — CYCLOBENZAPRINE HCL 10 MG PO TABS: 10.0000 mg | ORAL_TABLET | Freq: Two times a day (BID) | ORAL | 0 refills | Status: DC | PRN

## 2018-05-12 MED ORDER — PREDNISONE 10 MG PO TABS: ORAL_TABLET | ORAL | 0 refills | Status: DC

## 2018-05-12 NOTE — ED Triage Notes (Signed)
Pt presents to eD for assessment of left sided back spasms since waking up this morning (left flank).  Denies changes in urination, c/o spasms with movement and increased pain with palpation.  Pt also c/o bilateral hand numbness and tingling for three weeks.

## 2018-05-12 NOTE — ED Provider Notes (Signed)
[facility] EMERGENCY DEPARTMENT Provider Note   CSN: [id number] Arrival date & time: 05/12/18  1154     History   Chief Complaint Chief Complaint  Patient presents with  . Back Pain    HPI April [name] is a 57 y.o. female.  Pt comes in with c/o lower to mid pack pain on the left side. States that it started this morning bending over to pull her pants up. She does have a history of back problems but this is higher then normal. She states that she felt a pop.she does have some numbness and tingling but it has been there since doing chemo     Past Medical History:  Diagnosis Date  . Allergy 07/28/2012   Seasonal/Environmental allergies  . Anxiety 2013   Since 2013  . Arthritis 2014 per patient    knees and shoulders  . Bilateral ankle fractures 07/2015   Booted and resolved   . Cancer [facility]) dx June 22, 2016   right breast  . Depression 2013   Multiple  episodes  in past.  . Elevated cholesterol 2017  . Fibromyalgia 2013   diagnosed by Dr. [name]  . Genital herpes 2005   Has outbreaks monthly if not on preventative medication  . GERD (gastroesophageal reflux disease) 2013  . History of radiation therapy 02/07/17- 03/21/17   Right Breast- 4 field 25 fractions. 50 Gy to SCLV/PAB in 25 fractions. Right Breast Boost 10 gy in 5 fractions.  . Migraine 2013   migraines  . Neuromuscular disorder ([place]) 03/20/2017   neuropathy in fingers and toes from Chemo--intermittent  . Obesity   . Osteoporosis 03/23/2017   noted per bone density scan  . Peripheral neuropathy 08/13/2017  . Personal history of chemotherapy 11/2016  . Personal history of radiation therapy    4/18  . Right wrist fracture 06/2015   Resolved  . Scoliosis of thoracic spine 01/04/2012  . Skin condition 2012   patient reports periodic episodes of severe itching. She will itch and then blister at areas including her arms, back, and buttocks.   . Urinary, incontinence, stress female  07/14/2016   patient reported    Patient Active Problem List   Diagnosis Date Noted  . Neuropathy due to chemotherapeutic drug ([place]) 02/11/2018  . Peripheral neuropathy 08/13/2017  . Osteoporosis 03/23/2017  . Breast cancer metastasized to axillary lymph node ([place]) 06/21/2016  . Malignant neoplasm of upper-outer quadrant of right breast in female, estrogen receptor positive ([place]) 06/21/2016  . Elevated blood pressure reading without diagnosis of hypertension 04/28/2016  . Herpes genitalis--since 2005 per patient   . Environmental and seasonal allergies 07/28/2012  . Fibromyalgia   . Anxiety 02/07/2012  . Depression (emotion) 01/04/2012  . Headache-migranes 01/04/2012    Past Surgical History:  Procedure Laterality Date  . AXILLARY LYMPH NODE DISSECTION Right 12/26/2016   Procedure: RIGHT AXILLARY LYMPH NODE DISSECTION;  Surgeon: Excell Seltzer, MD;  Location: [place];  Service: General;  Laterality: Right;  . BREAST LUMPECTOMY WITH NEEDLE LOCALIZATION Right 12/19/2016   Procedure: RIGHT BREAST NEEDLE LOCALIZED LUMPECTOMY, RIGHT RADIOACTIVE SEED TARGETED AXILLARY SENTINEL LYMPH NODE BIOPSY;  Surgeon: Excell Seltzer, MD;  Location: [place];  Service: General;  Laterality: Right;  . IR GENERIC HISTORICAL  10/09/2016   IR CV LINE INJECTION 10/09/2016 Aletta Edouard, MD [facility]-INTER[facility] RAD  . PORT-A-CATH REMOVAL Left 12/19/2016   Procedure: REMOVAL PORT-A-CATH;  Surgeon: Excell Seltzer, MD;  Location: [place];  Service: General;  Laterality: Left;  . PORTACATH PLACEMENT N/A 07/11/2016   Procedure: INSERTION PORT-A-CATH;  Surgeon: Excell Seltzer, MD;  Location: [facility];  Service: General;  Laterality: N/A;  . RADIOACTIVE SEED GUIDED AXILLARY SENTINEL LYMPH NODE Right 12/19/2016   Procedure: RADIOACTIVE SEED GUIDED AXILLARY SENTINEL LYMPH NODE BIOPSY;  Surgeon: Excell Seltzer, MD;  Location: [place];  Service: General;   Laterality: Right;  . WISDOM TOOTH EXTRACTION  yrs ago     OB History    Gravida  3   Para  1   Term  1   Preterm      AB  2   Living  1     SAB      TAB  2   Ectopic      Multiple      Live Births               Home Medications    Prior to Admission medications   Medication Sig Start Date End Date Taking? Authorizing Provider  acetaminophen (TYLENOL 8 HOUR) 650 MG CR tablet Take 650 mg by mouth 2 (two) times daily as needed for pain. 02/28/18   [provider]  butalbital-acetaminophen-caffeine (FIORICET, ESGIC) 50-325-40 MG tablet TAKE 2 TABLETS BY MOUTH EVERY 6 HOURS AS NEEDED FOR HEADACHE/MIGRAINE Patient not taking: Reported on 04/08/2018 10/23/17   Magrinat, Virgie Dad, MD  Calcium Carb-Cholecalciferol (CALCIUM/VITAMIN D) 600-400 MG-UNIT TABS Take 2 tablets by mouth daily. Reports starting January 2018    [provider]  cetirizine (ZYRTEC) 10 MG tablet Take 10 mg by mouth daily.    [provider]  cyclobenzaprine (FLEXERIL) 10 MG tablet Take 1 tablet (10 mg total) by mouth 2 (two) times daily as needed for muscle spasms. 05/12/18   Glendell Docker, NP  DULoxetine (CYMBALTA) 60 MG capsule TAKE 1 CAPSULE BY MOUTH ONCE DAILY (TAKE ALONG WITH 30MG  CAPSULE FOR A TOTAL OF 90MG  OF CYMBALTA PER DAY) 03/12/18   Magrinat, Virgie Dad, MD  ECHINACEA PO Take 2 capsules by mouth 2 (two) times daily as needed (Only takes when she is getting a cold).  07/02/17   [provider]  exemestane (AROMASIN) 25 MG tablet Take 1 tablet (25 mg total) by mouth daily after breakfast. 02/11/18   Magrinat, Virgie Dad, MD  ibuprofen (ADVIL,MOTRIN) 800 MG tablet TAKE 1 TABLET BY MOUTH EVERY 8 HOURS AS NEEDED. 10/16/16   Magrinat, Virgie Dad, MD  mometasone (NASONEX) 50 MCG/ACT nasal spray 2 sprays each nostril daily 01/08/18   [name], MD  Polyethyl Glycol-Propyl Glycol (SYSTANE OP) Apply 1 drop to eye 3 (three) times daily. 01/16/18   [provider]    predniSONE (DELTASONE) 10 MG tablet 6 day taper 05/12/18   Glendell Docker, NP  ranitidine (ZANTAC) 75 MG tablet Take 75 mg by mouth daily.     [provider]  tetrahydrozoline-zinc (VISINE-AC) 0.05-0.25 % ophthalmic solution Place 2 drops into both eyes daily. 01/16/18   [provider]  valACYclovir (VALTREX) 1000 MG tablet Take 0.5 tablets (500 mg total) by mouth 2 (two) times daily. 05/31/17   Magrinat, Virgie Dad, MD    Family History Family History  Problem Relation Age of Onset  . Arthritis Mother   . Hypertension Mother   . Heart disease Mother   . Dementia Mother   . Irritable bowel syndrome Mother   . Emphysema Father   . Cancer Father        bladder  .  Cerebral aneurysm Father        ruptured aneurysm was cause of death  . Bipolar disorder Daughter        Not clear if this is the case.  Possibly Bipolar II  . Depression Daughter   . Graves' disease Sister   . Vitiligo Sister   . Mental illness Brother        Depression  . Mental illness Sister        likely undiagnosed schizophrenia  . Mental illness Brother        Schizophrenia    Social History Social History   Tobacco Use  . Smoking status: Current Some Day Smoker    Packs/day: 0.25    Years: 15.00    Pack years: 3.75    Types: Cigarettes    Last attempt to quit: 01/21/1994    Years since quitting: 24.3  . Smokeless tobacco: Never Used  Substance Use Topics  . Alcohol use: Yes    Alcohol/week: 1.2 - 2.4 oz    Types: 2 - 4 Standard drinks or equivalent per week    Comment: rarely  . Drug use: Yes    Types: Marijuana    Comment: last smoked 6 months ago     Allergies   Hydrocodone; Ultram [tramadol hcl]; and Gabapentin   Review of Systems Review of Systems  All other systems reviewed and are negative.    Physical Exam Updated Vital Signs BP 137/87 (BP Location: Right Arm)   Pulse 77   Temp 98.5 F (36.9 C) (Oral)   Resp 18   Ht 5\' 4"  (1.626 m)   Wt 104.3 kg (230 lb)    SpO2 98%   BMI 39.48 kg/m   Physical Exam  Constitutional: She is oriented to person, place, and time. She appears well-developed and well-nourished.  Cardiovascular: Normal rate.  Pulmonary/Chest: Effort normal and breath sounds normal.  Musculoskeletal: Normal range of motion.  Left lumbar paraspinal tenderness. Full rom  Neurological: She is alert and oriented to person, place, and time.  Skin: Skin is warm and dry.  Psychiatric: She has a normal mood and affect.  Nursing note and vitals reviewed.    ED Treatments / Results  Labs (all labs ordered are listed, but only abnormal results are displayed) Labs Reviewed - No data to display  EKG None  Radiology No results found.  Procedures Procedures (including critical care time)  Medications Ordered in ED Medications - No data to display   Initial Impression / Assessment and Plan / ED Course  I have reviewed the triage vital signs and the nursing notes.  Pertinent labs & imaging results that were available during my care of the patient were reviewed by me and considered in my medical decision making (see chart for details).     Neurologically intact. Will treat with flexeril and prednisone. Discussed follow up for continued symptoms  Final Clinical Impressions(s) / ED Diagnoses   Final diagnoses:  Muscle spasm of back    ED Discharge Orders        Ordered    cyclobenzaprine (FLEXERIL) 10 MG tablet  2 times daily PRN     05/12/18 1327    predniSONE (DELTASONE) 10 MG tablet     05/12/18 1327       Glendell Docker, NP 05/12/18 [place], MD 05/12/18 1811

## 2018-05-13 ENCOUNTER — Other Ambulatory Visit: Payer: Self-pay | Admitting: *Deleted

## 2018-05-13 DIAGNOSIS — C50411 Malignant neoplasm of upper-outer quadrant of right female breast: Secondary | ICD-10-CM

## 2018-05-13 DIAGNOSIS — Z170 Estrogen receptor positive status [ER+]: Principal | ICD-10-CM

## 2018-05-15 ENCOUNTER — Ambulatory Visit: Payer: Medicaid Other | Admitting: Internal Medicine

## 2018-05-15 ENCOUNTER — Encounter: Payer: Self-pay | Admitting: Internal Medicine

## 2018-05-15 ENCOUNTER — Other Ambulatory Visit: Payer: Self-pay | Admitting: Oncology

## 2018-05-15 VITALS — BP 112/82 | HR 78 | Resp 12 | Ht 63.0 in | Wt 230.0 lb

## 2018-05-15 DIAGNOSIS — M654 Radial styloid tenosynovitis [de Quervain]: Secondary | ICD-10-CM

## 2018-05-15 DIAGNOSIS — M797 Fibromyalgia: Secondary | ICD-10-CM

## 2018-05-15 DIAGNOSIS — F329 Major depressive disorder, single episode, unspecified: Secondary | ICD-10-CM | POA: Diagnosis not present

## 2018-05-15 DIAGNOSIS — G5603 Carpal tunnel syndrome, bilateral upper limbs: Secondary | ICD-10-CM | POA: Insufficient documentation

## 2018-05-15 DIAGNOSIS — J3089 Other allergic rhinitis: Secondary | ICD-10-CM | POA: Diagnosis not present

## 2018-05-15 DIAGNOSIS — F32A Depression, unspecified: Secondary | ICD-10-CM

## 2018-05-15 MED ORDER — FLUTICASONE PROPIONATE 50 MCG/ACT NA SUSP: 2.0000 | Freq: Every day | NASAL | 11 refills | Status: DC

## 2018-05-15 NOTE — Progress Notes (Signed)
Subjective:    Patient ID: April [name], female    DOB: 1961/06/23, 57 y.o.   MRN: [id number]  HPI   1.  Skin eruption:  Stopped Anastrozole and switched to [facility]  on March 11th.   Her skin seems to have cleared dramatically since.  Not clear her skin changes were on a time line with the start of Anastrozole.   She did not go back to the dermatologist. She is not interested in further analysis of cause as she feels this was medicine related. Skin lesions have been a recurrent issue for her.  2.  Trigger thumb on the right:  Started about 1 month ago. Very painful.  Has also noted her hands getting numb for the past 2 months.  She states both hands just go numb.  Her forearms get painful.  Holding her phone, driving, just about anything causes numbness and pain.  States her feet also get numb--she does have a peripheral neuropathy from Taxol treatment of breast cancer.   She cannot recall any new repetitive hand motion prior to the hand numbness and pain.  Later, remembers doing more gardening the beginning of April until now.  Digging up old yard for her garden.  Trying to make her garden planting area bigger.    3.  Depression:  She does feel this is gradually worsening.  4. Fibromyalgia:  Wondering if she can go back to Dr. [name].  Has not been to her in years.  Difficulty sleeping--cannot find a position of comfort.  5.  Allergies:  Medicaid did not cover nasonex.  Was told she needed to purchase her allergy meds OTC  6.  Muscle back spasm for which she was seen recently in ED.  Has not filled Cyclobenzaprine or Prednisone, though they are waiting for her at pharmacy.  Cyclobenzaprine helps with her ability to sleep in past with fibromyalgia.  Current Meds  Medication Sig  . acetaminophen (TYLENOL 8 HOUR) 650 MG CR tablet Take 650 mg by mouth 2 (two) times daily as needed for pain.  . butalbital-acetaminophen-caffeine (FIORICET, ESGIC) 50-325-40 MG tablet TAKE 2 TABLETS BY  MOUTH EVERY 6 HOURS AS NEEDED FOR HEADACHE/MIGRAINE  . Calcium Carb-Cholecalciferol (CALCIUM/VITAMIN D) 600-400 MG-UNIT TABS Take 2 tablets by mouth daily. Reports starting January 2018  . cetirizine (ZYRTEC) 10 MG tablet Take 10 mg by mouth daily.  . DULoxetine (CYMBALTA) 60 MG capsule TAKE 1 CAPSULE BY MOUTH ONCE DAILY (TAKE ALONG WITH 30MG  CAPSULE FOR A TOTAL OF 90MG  OF CYMBALTA PER DAY)  . exemestane (AROMASIN) 25 MG tablet Take 1 tablet (25 mg total) by mouth daily after breakfast.  . ibuprofen (ADVIL,MOTRIN) 800 MG tablet TAKE 1 TABLET BY MOUTH EVERY 8 HOURS AS NEEDED.  . ranitidine (ZANTAC) 75 MG tablet Take 75 mg by mouth daily.   . valACYclovir (VALTREX) 1000 MG tablet Take 0.5 tablets (500 mg total) by mouth 2 (two) times daily.    Allergies  Allergen Reactions  . Hydrocodone Nausea Only and Other (See Comments)    dizziness  . Ultram [Tramadol Hcl] Nausea Only  . Gabapentin Rash   Review of Systems     Objective:   Physical Exam  NAD.  Walks gingerly to exam table--hurts all over HEENT: PERRL, EOMI Neck:  Supple, No adenopathy Chest:  CTA CV:  RRR without murmur or rub, radial pulses normal and equal. UE:  + finkelsteins on right and less so on left.  + tinels and Phalens bilaterally over median  nerve at wrist..     Assessment & Plan:  1.  De Quervains tenosynovitis bilaterally  2.  Carpal Tunnel Syndrome bilaterally To see if combination spica and cock up splint for bilateral wrists To go ahead and obtain the prednisone for her back to take 30 mg daily for 4 days then taper by 5 mg daily over next 5 days until off.  3.  Fibromyalgia with poor sleep.  Cyclobenzaprine 5-10 mg at bedtime.  To call if this helps and will refill regularly.  If she would like referral at any time with Dr. [name], she will let me know.  4.  Depression:  Encouraged patient to work with April Burows, LCSW.  Continue Cymbalta.  5.  Allergies:  Switch to Fluticasone nasal.

## 2018-05-15 NOTE — Patient Instructions (Signed)
Wear your splints all the time for 2 weeks, especially at night (off to wash hands or bathe or if in pool) Limit your time in physical activity, but be active every day. Take your prednisone as follows: Day 1-4:  3 tabs Day 5:  2.5 Day 6:  2 Day 7:  1.5 Day 8:  1 Day 9:  0.5 tab--done after this.

## 2018-06-10 ENCOUNTER — Encounter: Payer: Self-pay | Admitting: Oncology

## 2018-06-10 ENCOUNTER — Other Ambulatory Visit: Payer: Self-pay | Admitting: Oncology

## 2018-06-10 DIAGNOSIS — Z853 Personal history of malignant neoplasm of breast: Secondary | ICD-10-CM

## 2018-06-20 ENCOUNTER — Ambulatory Visit
Admission: RE | Admit: 2018-06-20 | Discharge: 2018-06-20 | Disposition: A | Discharge status: Home / Self Care | Payer: Medicaid Other | Source: Ambulatory Visit | Attending: Oncology | Admitting: Oncology

## 2018-06-20 DIAGNOSIS — Z853 Personal history of malignant neoplasm of breast: Secondary | ICD-10-CM

## 2018-07-01 ENCOUNTER — Telehealth: Payer: Self-pay | Admitting: Adult Health

## 2018-07-01 ENCOUNTER — Inpatient Hospital Stay: Payer: Medicaid Other

## 2018-07-01 ENCOUNTER — Encounter: Payer: Self-pay | Admitting: *Deleted

## 2018-07-01 ENCOUNTER — Encounter: Payer: Self-pay | Admitting: Adult Health

## 2018-07-01 ENCOUNTER — Inpatient Hospital Stay: Payer: Medicaid Other | Attending: Adult Health | Admitting: Adult Health

## 2018-07-01 VITALS — BP 125/94 | HR 88 | Temp 98.5°F | Resp 18 | Ht 63.0 in | Wt 228.5 lb

## 2018-07-01 DIAGNOSIS — Z170 Estrogen receptor positive status [ER+]: Secondary | ICD-10-CM | POA: Diagnosis not present

## 2018-07-01 DIAGNOSIS — C50411 Malignant neoplasm of upper-outer quadrant of right female breast: Secondary | ICD-10-CM

## 2018-07-01 DIAGNOSIS — M810 Age-related osteoporosis without current pathological fracture: Secondary | ICD-10-CM | POA: Insufficient documentation

## 2018-07-01 DIAGNOSIS — Z923 Personal history of irradiation: Secondary | ICD-10-CM | POA: Insufficient documentation

## 2018-07-01 DIAGNOSIS — C773 Secondary and unspecified malignant neoplasm of axilla and upper limb lymph nodes: Secondary | ICD-10-CM | POA: Diagnosis not present

## 2018-07-01 DIAGNOSIS — Z9221 Personal history of antineoplastic chemotherapy: Secondary | ICD-10-CM | POA: Insufficient documentation

## 2018-07-01 DIAGNOSIS — C50919 Malignant neoplasm of unspecified site of unspecified female breast: Secondary | ICD-10-CM

## 2018-07-01 DIAGNOSIS — Z006 Encounter for examination for normal comparison and control in clinical research program: Secondary | ICD-10-CM | POA: Diagnosis not present

## 2018-07-01 DIAGNOSIS — N6459 Other signs and symptoms in breast: Secondary | ICD-10-CM | POA: Diagnosis not present

## 2018-07-01 LAB — CMP (CANCER CENTER ONLY)
ALBUMIN: 3.7 g/dL (ref 3.5–5.0)
ALT: 12 U/L (ref 0–44)
AST: 17 U/L (ref 15–41)
Alkaline Phosphatase: 123 U/L (ref 38–126)
Anion gap: 7 (ref 5–15)
BUN: 9 mg/dL (ref 6–20)
CO2: 27 mmol/L (ref 22–32)
Calcium: 9.6 mg/dL (ref 8.9–10.3)
Chloride: 106 mmol/L (ref 98–111)
Creatinine: 0.81 mg/dL (ref 0.44–1.00)
GFR, Est AFR Am: >60 mL/min (ref >60)
GFR, Estimated: >60 mL/min (ref >60)
GLUCOSE: 80 mg/dL (ref 70–99)
Potassium: 3.8 mmol/L (ref 3.5–5.1)
SODIUM: 140 mmol/L (ref 135–145)
Total Bilirubin: 0.3 mg/dL (ref 0.3–1.2)
Total Protein: 7.3 g/dL (ref 6.5–8.1)

## 2018-07-01 LAB — CBC WITH DIFFERENTIAL (CANCER CENTER ONLY)
Basophils Absolute: 0 10*3/uL (ref 0.0–0.1)
Basophils Relative: 0 %
Eosinophils Absolute: 0.1 10*3/uL (ref 0.0–0.5)
Eosinophils Relative: 3 %
HCT: 41.2 % (ref 34.8–46.6)
Hemoglobin: 14 g/dL (ref 11.6–15.9)
Lymphocytes Relative: 15 %
Lymphs Abs: 0.7 10*3/uL — ABNORMAL LOW (ref 0.9–3.3)
MCH: 32.8 pg (ref 25.1–34.0)
MCHC: 33.9 g/dL (ref 31.5–36.0)
MCV: 96.8 fL (ref 79.5–101.0)
MONOS PCT: 7 %
Monocytes Absolute: 0.3 10*3/uL (ref 0.1–0.9)
NEUTROS ABS: 3.5 10*3/uL (ref 1.5–6.5)
Neutrophils Relative %: 75 %
Platelet Count: 247 10*3/uL (ref 145–400)
RBC: 4.25 MIL/uL (ref 3.70–5.45)
RDW: 13.8 % (ref 11.2–14.5)
WBC Count: 4.7 10*3/uL (ref 3.9–10.3)

## 2018-07-01 NOTE — Progress Notes (Signed)
AFT-05 [name]: Study Visit for Cycle #15 07/01/18 @ 1100: Patient arrived to [facility] unaccompanied for her Cycle #15 study visit with April Bihari, NP. The research nurse met with her in the lobby after her CBC/diff and Cmet were collected. Lab: CBC/diff and Cmet results are wnl and required no action. Patient provided a copy. Concomitant Meds: Prednisone taper pack (05/15/18-05/20/18) completed without difficulty that was ordered for back muscle spasm ( 05/12/18) that resolved, but April [name] suggested taking for De Quervain's [name] along with using bilateral splint. She started cyclobenzaprine 10 mg at bedtime if needed when she is in pain and reports only taking once since it was started on 05/15/18. Corrected Cymbalta dose to 60 mg daily and she reports this has been her dose since 10/2017. She has discontinued her Systane eye drops since her dry eye has resolved. She has not required the use of OTC echinacea, but keeps it for prn use for symptoms of early URI. Vitals: Initial BP [name] of 125/94 was rechecked at 1145 after rest and 124/75 was 2nd [name] in left arm (sitting).  Medication Diaries: Patient returned exemestane medication diaries for cycles #12-14 and they were checked for completeness and revealed 100% compliance. She was provided medication diaries for cycles #15-17 with cycle #15 to begin on 07/02/18. AEs: Presented to ED with muscle spams in back (Grade 2) on 05/12/18 and was prescribed prednisone for 6 day taper and prn cyclobenzaprine, but she failed to fill either at that time and the pain resolved by 05/15/18. Prior AE of "trigger thumb" per April [name] at last visit was diagnosed as April [name] (Grade 2)-bilateral R>L and she was instructed to take the 6 days of prednisone she did not fill when she went to ED for muscle spasms. Prior AE of "parethesia in arms" per April [name] were diagnosed as Carpal Tunnel Syndrome per April [name] (Grade 2). She was prescribed a combination spica  and cock splint by April [name] for both issues, but admits she does not wear it much. Dry eyes (Grade 1) is resolved. Reports all other noted AEs are stable. Exam:She was seen and examined by NP, including breast exam with no new findings. Normal mammogram report reviewed with patient. Bone density was discussed and Prolia was discussed with patient again, however she needs to finish her dental extractions prior to beginning this and tells NP she has a consultation appointment with oral surgeon on 07/10/18. She was provided a Bone Health handout. Confirmed with patient that a referral was placed for her to see April [name] on 01/28/18. Encouraged her to call the office to follow up on this. She would like to get back with April [name] to manage her fibromyalgia and joint issues. She has recently noted mild hair thinning (Grade 1) and was informed this is most likely due to the exemestane. April [name] will return to clinic on 09/24/18 for her cycle #18 assessment. Research nurse thanked her for her continued participation in the [place] study and encouraged her to call with any questions or concerns. April [name] April [name], April [name], April [name] Clinical Research Nurse 07/01/18 @ April [name] [phone]  07/01/2018   Study/Protocol: April [name] Cycle: 12-14 Adverse Events (04/09/18-07/01/18)  Event Grade Onset Date Resolved Date Attribution to Exemestane Treatment Comments   Breast Pain (R)-int  1  07/03/17   ongoing  No       None Occasional- Sharp pain-brief  Arthritis-both great toes 1 08/13/17 ongoing   No      None  Radiation Pneumonitis 1 07/27/17 ongoing No      None Per CT scan  Fatigue 1 01/31/18 ongoing Yes       None   Headache   1 04/08/18 ongoing No Tylenol prn Intermittent  Muscle spasm in back 2 05/12/18 05/15/18 No Rest, Prednisone and cyclobenzaprine ordered    Peripheral Neuropathy 2 08/22/17 ongoing No Lyrica stopped per patient   Joint disorder April [name]  2 05/15/18 ongoing No  Prednisone x 6 days and splint ordered Refer to April [name]  Paresthesia in arms: Carpal Tunnel Syndrome 2 05/15/18 ongoing No Spica and cock splint ordered by April [name]   Paresthesia in arms: Carpal Tunnel Syndrome 1 04/08/18 05/15/18 No Refer to April [name]   Alopecia-mild hair thinning 1 07/01/18 ongoing Yes       None   April [name], Therapist, sports, Product/process development scientist
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
## 2018-07-01 NOTE — Patient Instructions (Signed)
Bone Health Bones protect organs, store calcium, and anchor muscles. Good health habits, such as eating nutritious foods and exercising regularly, are important for maintaining healthy bones. They can also help to prevent a condition that causes bones to lose density and become weak and brittle (osteoporosis). Why is bone mass important? Bone mass refers to the amount of bone tissue that you have. The higher your bone mass, the stronger your bones. An important step toward having healthy bones throughout life is to have strong and dense bones during childhood. A young adult who has a high bone mass is more likely to have a high bone mass later in life. Bone mass at its greatest it is called peak bone mass. A large decline in bone mass occurs in older adults. In women, it occurs about the time of menopause. During this time, it is important to practice good health habits, because if more bone is lost than what is replaced, the bones will become less healthy and more likely to break (fracture). If you find that you have a low bone mass, you may be able to prevent osteoporosis or further bone loss by changing your diet and lifestyle. How can I find out if my bone mass is low? Bone mass can be measured with an X-ray test that is called a bone mineral density (BMD) test. This test is recommended for all women who are age 65 or older. It may also be recommended for men who are age 70 or older, or for people who are more likely to develop osteoporosis due to:  Having bones that break easily.  Having a long-term disease that weakens bones, such as kidney disease or rheumatoid arthritis.  Having menopause earlier than normal.  Taking medicine that weakens bones, such as steroids, thyroid hormones, or hormone treatment for breast cancer or prostate cancer.  Smoking.  Drinking three or more alcoholic drinks each day.  What are the nutritional recommendations for healthy bones? To have healthy bones, you  need to get enough of the right minerals and vitamins. Most nutrition experts recommend getting these nutrients from the foods that you eat. Nutritional recommendations vary from person to person. Ask your health care provider what is healthy for you. Here are some general guidelines. Calcium Recommendations Calcium is the most important (essential) mineral for bone health. Most people can get enough calcium from their diet, but supplements may be recommended for people who are at risk for osteoporosis. Good sources of calcium include:  Dairy products, such as low-fat or nonfat milk, cheese, and yogurt.  Dark green leafy vegetables, such as bok choy and broccoli.  Calcium-fortified foods, such as orange juice, cereal, bread, soy beverages, and tofu products.  Nuts, such as almonds.  Follow these recommended amounts for daily calcium intake:  Children, age 1?3: 700 mg.  Children, age 4?8: 1,000 mg.  Children, age 9?13: 1,300 mg.  Teens, age 14?18: 1,300 mg.  Adults, age 19?50: 1,000 mg.  Adults, age 51?70: ? Men: 1,000 mg. ? Women: 1,200 mg.  Adults, age 71 or older: 1,200 mg.  Pregnant and breastfeeding females: ? Teens: 1,300 mg. ? Adults: 1,000 mg.  Vitamin D Recommendations Vitamin D is the most essential vitamin for bone health. It helps the body to absorb calcium. Sunlight stimulates the skin to make vitamin D, so be sure to get enough sunlight. If you live in a cold climate or you do not get outside often, your health care provider may recommend that you take vitamin   D supplements. Good sources of vitamin D in your diet include:  Egg yolks.  Saltwater fish.  Milk and cereal fortified with vitamin D.  Follow these recommended amounts for daily vitamin D intake:  Children and teens, age 1?18: 600 international units.  Adults, age 50 or younger: 400-800 international units.  Adults, age 51 or older: 800-1,000 international units.  Other Nutrients Other nutrients  for bone health include:  Phosphorus. This mineral is found in meat, poultry, dairy foods, nuts, and legumes. The recommended daily intake for adult men and adult women is 700 mg.  Magnesium. This mineral is found in seeds, nuts, dark green vegetables, and legumes. The recommended daily intake for adult men is 400?420 mg. For adult women, it is 310?320 mg.  Vitamin K. This vitamin is found in green leafy vegetables. The recommended daily intake is 120 mg for adult men and 90 mg for adult women.  What type of physical activity is best for building and maintaining healthy bones? Weight-bearing and strength-building activities are important for building and maintaining peak bone mass. Weight-bearing activities cause muscles and bones to work against gravity. Strength-building activities increases muscle strength that supports bones. Weight-bearing and muscle-building activities include:  Walking and hiking.  Jogging and running.  Dancing.  Gym exercises.  Lifting weights.  Tennis and racquetball.  Climbing stairs.  Aerobics.  Adults should get at least 30 minutes of moderate physical activity on most days. Children should get at least 60 minutes of moderate physical activity on most days. Ask your health care provide what type of exercise is best for you. Where can I find more information? For more information, check out the following websites:  National Osteoporosis Foundation: http://nof.org/learn/basics  National Institutes of Health: http://www.niams.nih.gov/Health_Info/Bone/Bone_Health/bone_health_for_life.asp  This information is not intended to replace advice given to you by your health care provider. Make sure you discuss any questions you have with your health care provider. Document Released: 02/10/2004 Document Revised: 06/09/2016 Document Reviewed: 11/25/2014 Elsevier Interactive Patient Education  2018 Elsevier Inc.   Denosumab injection What is this  medicine? DENOSUMAB (den oh sue mab) slows bone breakdown. Prolia is used to treat osteoporosis in women after menopause and in men. Xgeva is used to treat a high calcium level due to cancer and to prevent bone fractures and other bone problems caused by multiple myeloma or cancer bone metastases. Xgeva is also used to treat giant cell tumor of the bone. This medicine may be used for other purposes; ask your health care provider or pharmacist if you have questions. COMMON BRAND NAME(S): Prolia, XGEVA What should I tell my health care provider before I take this medicine? They need to know if you have any of these conditions: -dental disease -having surgery or tooth extraction -infection -kidney disease -low levels of calcium or Vitamin D in the blood -malnutrition -on hemodialysis -skin conditions or sensitivity -thyroid or parathyroid disease -an unusual reaction to denosumab, other medicines, foods, dyes, or preservatives -pregnant or trying to get pregnant -breast-feeding How should I use this medicine? This medicine is for injection under the skin. It is given by a health care professional in a hospital or clinic setting. If you are getting Prolia, a special MedGuide will be given to you by the pharmacist with each prescription and refill. Be sure to read this information carefully each time. For Prolia, talk to your pediatrician regarding the use of this medicine in children. Special care may be needed. For Xgeva, talk to your pediatrician regarding the use   of this medicine in children. While this drug may be prescribed for children as young as 13 years for selected conditions, precautions do apply. Overdosage: If you think you have taken too much of this medicine contact a poison control center or emergency room at once. NOTE: This medicine is only for you. Do not share this medicine with others. What if I miss a dose? It is important not to miss your dose. Call your doctor or health  care professional if you are unable to keep an appointment. What may interact with this medicine? Do not take this medicine with any of the following medications: -other medicines containing denosumab This medicine may also interact with the following medications: -medicines that lower your chance of fighting infection -steroid medicines like prednisone or cortisone This list may not describe all possible interactions. Give your health care provider a list of all the medicines, herbs, non-prescription drugs, or dietary supplements you use. Also tell them if you smoke, drink alcohol, or use illegal drugs. Some items may interact with your medicine. What should I watch for while using this medicine? Visit your doctor or health care professional for regular checks on your progress. Your doctor or health care professional may order blood tests and other tests to see how you are doing. Call your doctor or health care professional for advice if you get a fever, chills or sore throat, or other symptoms of a cold or flu. Do not treat yourself. This drug may decrease your body's ability to fight infection. Try to avoid being around people who are sick. You should make sure you get enough calcium and vitamin D while you are taking this medicine, unless your doctor tells you not to. Discuss the foods you eat and the vitamins you take with your health care professional. See your dentist regularly. Brush and floss your teeth as directed. Before you have any dental work done, tell your dentist you are receiving this medicine. Do not become pregnant while taking this medicine or for 5 months after stopping it. Talk with your doctor or health care professional about your birth control options while taking this medicine. Women should inform their doctor if they wish to become pregnant or think they might be pregnant. There is a potential for serious side effects to an unborn child. Talk to your health care professional  or pharmacist for more information. What side effects may I notice from receiving this medicine? Side effects that you should report to your doctor or health care professional as soon as possible: -allergic reactions like skin rash, itching or hives, swelling of the face, lips, or tongue -bone pain -breathing problems -dizziness -jaw pain, especially after dental work -redness, blistering, peeling of the skin -signs and symptoms of infection like fever or chills; cough; sore throat; pain or trouble passing urine -signs of low calcium like fast heartbeat, muscle cramps or muscle pain; pain, tingling, numbness in the hands or feet; seizures -unusual bleeding or bruising -unusually weak or tired Side effects that usually do not require medical attention (report to your doctor or health care professional if they continue or are bothersome): -constipation -diarrhea -headache -joint pain -loss of appetite -muscle pain -runny nose -tiredness -upset stomach This list may not describe all possible side effects. Call your doctor for medical advice about side effects. You may report side effects to FDA at 1-800-FDA-1088. Where should I keep my medicine? This medicine is only given in a clinic, doctor's office, or other health care setting and   be stored at home. NOTE: This sheet is a summary. It may not cover all possible information. If you have questions about this medicine, talk to your doctor, pharmacist, or health care provider.  2018 Elsevier/Gold Standard (2016-12-12 19:17:21)

## 2018-07-01 NOTE — Telephone Encounter (Signed)
Gave patient avs and calendar of upcoming appts.  °

## 2018-07-15 ENCOUNTER — Other Ambulatory Visit: Payer: Self-pay | Admitting: *Deleted

## 2018-07-15 DIAGNOSIS — C50411 Malignant neoplasm of upper-outer quadrant of right female breast: Secondary | ICD-10-CM

## 2018-07-15 DIAGNOSIS — Z170 Estrogen receptor positive status [ER+]: Principal | ICD-10-CM

## 2018-07-17 ENCOUNTER — Other Ambulatory Visit: Payer: Self-pay | Admitting: Oncology

## 2018-07-22 ENCOUNTER — Telehealth: Payer: Self-pay | Admitting: *Deleted

## 2018-07-22 NOTE — Telephone Encounter (Signed)
This RN returned pt's VM stating she wanted to let Dr Jana Hakim and [name] RN in research " know that I am not feeling well again "  Lashonda states " I felt great when I came in on the 29th - but then last week all the same symptoms began recurring "  Janera states onset of :  Headache " almost a migraine " Nausea General malaise  Neck pain  " I am not sure if there is anything anyone can do but we changed the medication thinking that was it but now I am feeling like I was "  Return call number given as [phone] note will be forwarded to MD and [name] for review and further recommendations.

## 2018-07-23 ENCOUNTER — Other Ambulatory Visit: Payer: Self-pay | Admitting: *Deleted

## 2018-07-23 ENCOUNTER — Telehealth: Payer: Self-pay | Admitting: *Deleted

## 2018-07-23 DIAGNOSIS — M797 Fibromyalgia: Secondary | ICD-10-CM

## 2018-07-23 DIAGNOSIS — G620 Drug-induced polyneuropathy: Secondary | ICD-10-CM

## 2018-07-23 DIAGNOSIS — C50411 Malignant neoplasm of upper-outer quadrant of right female breast: Secondary | ICD-10-CM

## 2018-07-23 DIAGNOSIS — Z170 Estrogen receptor positive status [ER+]: Secondary | ICD-10-CM

## 2018-07-23 NOTE — Telephone Encounter (Signed)
This RN spoke with the patient today per follow up from call yesterday and MD/ research nurse review.  Per MD- concern is that symptoms may likely be more related to her history of fibromyalgia which may need further work up/assessment and treatment.  Of note- pt had seen Dr Elenore Paddy several years ago- was re- referred back to his office but he declined referral due to not taking on new fibromyalgia pts ( pt was considered a new referral ).  Per above discussion - and inquiry - Ronnae would like to see another Rheumatologist.  Referral will be made.  Pt stated appreciation of discussion.

## 2018-08-14 ENCOUNTER — Ambulatory Visit: Payer: Medicaid Other | Admitting: Internal Medicine

## 2018-08-14 ENCOUNTER — Encounter: Payer: Self-pay | Admitting: Internal Medicine

## 2018-08-14 VITALS — BP 126/72 | HR 84 | Resp 12 | Ht 63.0 in | Wt 224.0 lb

## 2018-08-14 DIAGNOSIS — M654 Radial styloid tenosynovitis [de Quervain]: Secondary | ICD-10-CM

## 2018-08-14 DIAGNOSIS — G5603 Carpal tunnel syndrome, bilateral upper limbs: Secondary | ICD-10-CM

## 2018-08-14 DIAGNOSIS — M797 Fibromyalgia: Secondary | ICD-10-CM

## 2018-08-14 DIAGNOSIS — R21 Rash and other nonspecific skin eruption: Secondary | ICD-10-CM

## 2018-08-14 MED ORDER — VALACYCLOVIR HCL 1 G PO TABS: ORAL_TABLET | ORAL | 11 refills | Status: DC

## 2018-08-14 MED ORDER — CYCLOBENZAPRINE HCL 10 MG PO TABS: 10.0000 mg | ORAL_TABLET | Freq: Two times a day (BID) | ORAL | 1 refills | Status: DC | PRN

## 2018-08-14 NOTE — Patient Instructions (Signed)
Free flu vaccine/covered flu vaccine (bring insurance card if you have one) at flu vaccine clinics:  likely the orange card sign up in September 19th and October 17th for 2 hours sometime between 8:30 a.mm and 11:30 a.m and the Health Fair:  [place] next door to clinic on September 28th, Saturday fro 11 a.m to 3 p.m.  [facility] [address]  [place], [place] [address] [phone]  40% discount for [employer] [facility] Ask about April [name] and if they have other recommendations for supplements for fibromyalgia. (see if they have Dr. Arlean Hopping recommendations.) Also high dose B complex vitamin is a good idea

## 2018-08-14 NOTE — Progress Notes (Signed)
   Subjective:    Patient ID: April [name], female    DOB: 07-13-1961, 57 y.o.   MRN: [id number]  HPI   1.  Dequervain's Tenosynovitis:  Much better, but not wearing splint regularly.  Wears for only hours in a day.  Also, doing yard work that worsens her pain and not using her splints with the yard work.  2.  Carpal Tunnel:  Better also, but again, uncomfortable with splints at night, so not wearing as she should.  3.  Fibromyalgia:  Cyclobenzaprine does help, but having to take 20 mg at bedtime.  She checked with Dr Arlean Hopping office and they are not accepting Medicaid. Would like to reestablish with a Rheumatologist with a fibromyalgia focus.  4.  Headaches:  Frontal, temple areas bilaterally and neck:  Can have for 7 days.  Has not had one for a couple of weeks.    5.  Skin lesions:  Continue to wax and wane.  She did not feel dermatology at [facility] took her seriously.  She would like to be seen by a dermatologist locally now she has coverage. 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  Current Meds  Medication Sig  . acetaminophen (TYLENOL 8 HOUR) 650 MG CR tablet Take 650 mg by mouth 2 (two) times daily as needed for pain.  . Calcium Carb-Cholecalciferol (CALCIUM/VITAMIN D) 600-400 MG-UNIT TABS Take 2 tablets by mouth daily. Reports starting January 2018  . cetirizine (ZYRTEC) 10 MG tablet Take 10 mg by mouth daily.  . DULoxetine (CYMBALTA) 60 MG capsule TAKE 1 CAPSULE BY MOUTH ONCE DAILY (TAKE ALONG WITH 30MG  CAPSULE FOR A TOTAL OF 90MG  OF CYMBALTA PER DAY)  . exemestane (AROMASIN) 25 MG tablet Take 1 tablet (25 mg total) by mouth daily after breakfast.  . fluticasone (FLONASE) 50 MCG/ACT nasal spray Place 2 sprays into both nostrils daily.  Marland Kitchen ibuprofen (ADVIL,MOTRIN) 800 MG tablet TAKE 1 TABLET BY MOUTH EVERY 8 HOURS AS NEEDED.  . ranitidine (ZANTAC) 75 MG tablet Take 75 mg by mouth daily.     Allergies  Allergen Reactions  . Hydrocodone Nausea  Only and Other (See Comments)    dizziness  . Ultram [Tramadol Hcl] Nausea Only  . Gabapentin Rash    Review of Systems     Objective:   Physical Exam NAD Lungs:  CTA CV:  RRR without murmur or rub.  Radial pulses normal and equal MS:  + finkelsteins with bilateral thumbs.  + tinels and phalens over median nerves bilaterally. Skin with a few scattered scabbed areas and scratch marks at top of back.        Assessment & Plan:  1.  Fibromyalgia:  Try tart cherry.  Will look at Dr. Amil Amen for fibromyalgia.  Use Cyclobenzaprine at bedtime Tart Cherry Vitamin B complex high dose  2.  CTS:  Wear splints regularly  3.  DeQuervains Tenosynovitis:  Wear splints regularly  4.  Skin lesions:  Recurrent for years.  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  She would like another opinion.  Will see if can get in with [facility] Derm/Dr. Ronnald Ramp.

## 2018-09-16 ENCOUNTER — Other Ambulatory Visit: Payer: Self-pay | Admitting: Oncology

## 2018-09-19 ENCOUNTER — Other Ambulatory Visit: Payer: Self-pay | Admitting: *Deleted

## 2018-09-19 DIAGNOSIS — C50411 Malignant neoplasm of upper-outer quadrant of right female breast: Secondary | ICD-10-CM

## 2018-09-19 DIAGNOSIS — Z170 Estrogen receptor positive status [ER+]: Principal | ICD-10-CM

## 2018-09-24 ENCOUNTER — Encounter: Payer: Self-pay | Admitting: Adult Health

## 2018-09-24 ENCOUNTER — Telehealth: Payer: Self-pay | Admitting: Oncology

## 2018-09-24 ENCOUNTER — Inpatient Hospital Stay (HOSPITAL_BASED_OUTPATIENT_CLINIC_OR_DEPARTMENT_OTHER): Payer: Medicaid Other | Admitting: Adult Health

## 2018-09-24 ENCOUNTER — Encounter: Payer: Self-pay | Admitting: *Deleted

## 2018-09-24 ENCOUNTER — Inpatient Hospital Stay: Payer: Medicaid Other | Attending: Adult Health

## 2018-09-24 ENCOUNTER — Ambulatory Visit (HOSPITAL_COMMUNITY)
Admission: RE | Admit: 2018-09-24 | Discharge: 2018-09-24 | Disposition: A | Discharge status: Home / Self Care | Payer: Medicaid Other | Source: Ambulatory Visit | Attending: Adult Health | Admitting: Adult Health

## 2018-09-24 VITALS — BP 120/86 | HR 82 | Temp 98.7°F | Resp 19 | Ht 63.0 in | Wt 227.0 lb

## 2018-09-24 DIAGNOSIS — Z006 Encounter for examination for normal comparison and control in clinical research program: Secondary | ICD-10-CM

## 2018-09-24 DIAGNOSIS — C50411 Malignant neoplasm of upper-outer quadrant of right female breast: Secondary | ICD-10-CM | POA: Insufficient documentation

## 2018-09-24 DIAGNOSIS — C773 Secondary and unspecified malignant neoplasm of axilla and upper limb lymph nodes: Secondary | ICD-10-CM

## 2018-09-24 DIAGNOSIS — Z170 Estrogen receptor positive status [ER+]: Principal | ICD-10-CM

## 2018-09-24 DIAGNOSIS — R05 Cough: Secondary | ICD-10-CM | POA: Insufficient documentation

## 2018-09-24 DIAGNOSIS — Z923 Personal history of irradiation: Secondary | ICD-10-CM | POA: Insufficient documentation

## 2018-09-24 DIAGNOSIS — J069 Acute upper respiratory infection, unspecified: Secondary | ICD-10-CM

## 2018-09-24 DIAGNOSIS — Z79811 Long term (current) use of aromatase inhibitors: Secondary | ICD-10-CM

## 2018-09-24 DIAGNOSIS — Z9221 Personal history of antineoplastic chemotherapy: Secondary | ICD-10-CM | POA: Diagnosis not present

## 2018-09-24 DIAGNOSIS — R053 Chronic cough: Secondary | ICD-10-CM

## 2018-09-24 DIAGNOSIS — M810 Age-related osteoporosis without current pathological fracture: Secondary | ICD-10-CM

## 2018-09-24 LAB — CMP (CANCER CENTER ONLY)
ALT: 12 U/L (ref 0–44)
AST: 15 U/L (ref 15–41)
Albumin: 3.4 g/dL — ABNORMAL LOW (ref 3.5–5.0)
Alkaline Phosphatase: 117 U/L (ref 38–126)
Anion gap: 9 (ref 5–15)
BILIRUBIN TOTAL: 0.3 mg/dL (ref 0.3–1.2)
BUN: 10 mg/dL (ref 6–20)
CHLORIDE: 106 mmol/L (ref 98–111)
CO2: 26 mmol/L (ref 22–32)
Calcium: 9.9 mg/dL (ref 8.9–10.3)
Creatinine: 0.86 mg/dL (ref 0.44–1.00)
GFR, Est AFR Am: >60 mL/min (ref >60)
GLUCOSE: 96 mg/dL (ref 70–99)
POTASSIUM: 4.2 mmol/L (ref 3.5–5.1)
Sodium: 141 mmol/L (ref 135–145)
TOTAL PROTEIN: 7 g/dL (ref 6.5–8.1)

## 2018-09-24 LAB — CBC WITH DIFFERENTIAL (CANCER CENTER ONLY)
ABS IMMATURE GRANULOCYTES: 0.01 10*3/uL (ref 0.00–0.07)
BASOS ABS: 0 10*3/uL (ref 0.0–0.1)
Basophils Relative: 0 %
Eosinophils Absolute: 0.1 10*3/uL (ref 0.0–0.5)
Eosinophils Relative: 3 %
HEMATOCRIT: 42.6 % (ref 36.0–46.0)
Hemoglobin: 14.2 g/dL (ref 12.0–15.0)
IMMATURE GRANULOCYTES: 0 %
LYMPHS PCT: 17 %
Lymphs Abs: 0.7 10*3/uL (ref 0.7–4.0)
MCH: 32.7 pg (ref 26.0–34.0)
MCHC: 33.3 g/dL (ref 30.0–36.0)
MCV: 98.2 fL (ref 80.0–100.0)
Monocytes Absolute: 0.4 10*3/uL (ref 0.1–1.0)
Monocytes Relative: 9 %
NEUTROS ABS: 3.1 10*3/uL (ref 1.7–7.7)
NRBC: 0 % (ref 0.0–0.2)
Neutrophils Relative %: 71 %
Platelet Count: 255 10*3/uL (ref 150–400)
RBC: 4.34 MIL/uL (ref 3.87–5.11)
RDW: 13.4 % (ref 11.5–15.5)
WBC: 4.4 10*3/uL (ref 4.0–10.5)

## 2018-09-24 LAB — HEMOGLOBIN A1C
HEMOGLOBIN A1C: 5.4 % (ref 4.8–5.6)
Mean Plasma Glucose: 108.28 mg/dL

## 2018-09-24 NOTE — Progress Notes (Signed)
AFT-05 [name]: Study Visit for Cycle #18 09/24/18 @ 1117: Patient arrived to [facility] unaccompanied for her cycle #18 study visit with April Bihari, NP. Questionnaires were given to patient to complete at entry by April [name], Research Assistant prior to lab appointment. Theses were checked for completeness by research nurse. Lab: CBC/diff and Cmet were normal except mild decrease in albumin which requires no action per NP. Patient was provided a copy of labs. Hgb A1-C result returned normal at 5.4. Concomitant Meds: She reports increasing her ranitidine from 75 mg daily to 150 mg daily 1 month ago (08/25/18). No change in other meds. She [name] started taking on prn basis Mucinex OTC cough medication since 09/06/18 and occasional Alka Seltzer Plus for URI.  Vitals:BP reading stable at 120/86 (grade 2 HTN at baseline). Medication Diaries:Patient returned exemestane medication diaries for cycle #15-17. Diaries checked for completeness and revealed 100% compliance. She was provided new diaries for cycles #18-20 with cycle #18 to begin today. AEs: Patient reports her De Quervain's [name] (trigger thumb)-bilateral [name] improved from grade 2 to grade 1. She still admits she does not wear the splints her PCP ordered as she [name] been instructed. All other prior AEs are unchanged. Reports symptoms of URI beginning 09/06/18 (cough of clear to yellow sputum, mild wheezing, chest congestion and sinus pressure) with no fever or chills. [name] been taking Mucinex OTC cough preparation and occasional Alka Seltzer Plus. Symptoms have not interfered with her ADLs, and have improved but are still lingering. She had a molar tooth extraction on 07/30/18 due to decay and reports this went well with no complications.  Exam: Seen and examined by NP, including breast exam with no new findings. Lungs were clear, but will have CXR after visit today. Plan is to begin Prolia injections at next visit due to baseline osteoporosis, since it  will have been several months since the dental extraction. Follow Up: She will return to clinic on 12/17/17 to see Dr. Jana [name] for her cycle #21 visit on study. Research nurse thanked her for her continued participation in [place] and provided her business card of her new research nurse, April Spurling, RN who will see her in January.  April [name] [phone]  09/24/2018   Study/Protocol: [name] Cycle: 15-17 Adverse Events (07/01/18-09/24/18)  Event Grade Onset Date Resolved Date Attribution to Exemestane Treatment Comments   Breast Pain (R)-int  1  07/03/17   ongoing  No       None Occasional- Sharp pain-brief  Arthritis-both great toes 1 08/13/17 ongoing   No      None   Radiation Pneumonitis 1 07/27/17 ongoing No      None Per CT scan  Fatigue 1 01/31/18 ongoing Yes       None   Headache   1 04/08/18 ongoing No Tylenol & Ibuprofen prn Intermittent  Joint disorder De Quervain's [name]  1 09/24/18 ongoing No Rest, splint per PCP    Peripheral Neuropathy 2 08/22/17 ongoing No Lyrica stopped per patient   Joint disorder April [name]  2 05/15/18 09/24/18 No Prednisone x 6 days and splint ordered Refer to Dr. [name]  Paresthesia in arms: Carpal Tunnel Syndrome 2 05/15/18 ongoing No Spica and cock splint ordered by PCP   Paresthesia in arms: Carpal Tunnel Syndrome 1 04/08/18 05/15/18 No Refer to Dr. [name]   Alopecia-mild hair thinning 1 07/01/18 ongoing Yes       None   URI symptoms 1 09/06/18 ongoing No OTC Mucinex &  Alka Seltzer prn CXR normal  Dental caries L. Lower molar 3 07/30/18 07/30/18 No Dental extraction 07/30/18   April [name], Therapist, sports, BSN Clinical Research Nurse 09/25/18
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
## 2018-09-24 NOTE — Telephone Encounter (Signed)
Gave avs and calendar ° °

## 2018-09-25 ENCOUNTER — Telehealth: Payer: Self-pay

## 2018-09-25 NOTE — Telephone Encounter (Signed)
Spoke with patient and got dentist info, Lovena Neighbours, DDS.  Nurse called dentist office and spoke with receptionist. Gave details of what was needed for pt to start Prolia.  Receptionist took info and said she would give it to the dentist to look over and some one would be in touch.  Nurse left name and number along with NP name.

## 2018-09-25 NOTE — Telephone Encounter (Signed)
-----   Message from Gardenia Phlegm, NP sent at 09/24/2018  9:27 PM EDT ----- Please call patient about normal results.  Radiation changes noted as expected, no sign of infection or cancer ----- Message ----- From: Interface, Rad Results In Sent: 09/24/2018   5:49 PM EDT To: Gardenia Phlegm, NP

## 2018-09-25 NOTE — Telephone Encounter (Signed)
Spoke with patient to inform of normal xray results.  Patient voiced understanding and thanks.  No questions or concerns at this time.

## 2018-09-25 NOTE — Telephone Encounter (Signed)
-----   Message from Gardenia Phlegm, NP sent at 09/24/2018 10:34 PM EDT ----- Will you get name of patient dentist and call and request written clearance for patient to start Prolia.  I forgot to review that with her yesterday.  Thanks, [place]

## 2018-09-26 ENCOUNTER — Telehealth: Payer: Self-pay

## 2018-09-26 NOTE — Telephone Encounter (Signed)
FYI, patient was going to potentially start prolia in January, however this is what we learned when calling her dentist.  Will hold off of for now.

## 2018-09-26 NOTE — Telephone Encounter (Signed)
Spoke with patient informing her that dentist will not give medical clearance for Prolia until she has a pending treatment completed.  Patient knew about tx and understood. She said she would get appt scheduled for it and let us know when completed.  It may take a couple of months she said.

## 2018-10-01 ENCOUNTER — Ambulatory Visit: Payer: Medicaid Other | Admitting: Adult Health

## 2018-10-01 ENCOUNTER — Other Ambulatory Visit: Payer: Medicaid Other

## 2018-10-03 ENCOUNTER — Telehealth: Payer: Self-pay | Admitting: Internal Medicine

## 2018-10-03 ENCOUNTER — Telehealth: Payer: Self-pay | Admitting: *Deleted

## 2018-10-03 NOTE — Telephone Encounter (Signed)
Spoke with patient. Informed oncology referred patient to rheumatology so she would need to follow up with them in regards to referral and Dr. Jarome Matin [facility]) does not accept medicaid. Patient does not want to go to [facility] to see dermatology. Patient would like to know what her other options are.  To Dr. Amil Amen for further direction

## 2018-10-03 NOTE — Telephone Encounter (Signed)
Pt left VM stating she has not heard from Dr Melissa Noon per referral for rheumatology.  Referral placed - unsure if records sent - this note will be sent to HIM and scheduling per above.

## 2018-10-03 NOTE — Telephone Encounter (Signed)
Patient called requesting information regarding status on Dermatology and Rheumatology referrals. Patient stated is being more than a month and did not hear anything yet. Patient can be reach at [phone].  Please advise.

## 2018-10-04 NOTE — Telephone Encounter (Signed)
Check with Dr. Allyson Sabal and other derms in town and will decide

## 2018-10-07 ENCOUNTER — Telehealth: Payer: Self-pay | Admitting: Oncology

## 2018-10-07 NOTE — Telephone Encounter (Signed)
Faxed medical records to [facility] at [phone], Release ID: [id number]

## 2018-10-14 NOTE — Telephone Encounter (Signed)
Left message for patient to call back. Only one place in [place] that will see medicaid patient for dermatology issues, which is [facility], but they are scheduling medicaid patients into March. Per Dr. Amil Amen if patient wants something sooner than march then she will have to go out of [place].

## 2018-10-14 NOTE — Telephone Encounter (Signed)
Spoke with patient. States she is okay with waiting until March for appointment. States she has no desire to go outside of [place] for dermatology.  To Dr. Amil Amen to put in referral for [facility] for her Dermatology referral

## 2018-11-02 NOTE — Addendum Note (Signed)
Addended by: Marcelino Duster on: 11/02/2018 11:46 PM   Modules accepted: Orders

## 2018-11-20 ENCOUNTER — Other Ambulatory Visit: Payer: Self-pay | Admitting: Oncology

## 2018-11-28 ENCOUNTER — Encounter (HOSPITAL_COMMUNITY): Payer: Self-pay

## 2018-11-28 ENCOUNTER — Observation Stay (HOSPITAL_COMMUNITY)
Admission: EM | Admit: 2018-11-28 | Discharge: 2018-11-29 | Disposition: A | Discharge status: Home / Self Care | Payer: Medicaid Other | Attending: Surgery | Admitting: Surgery

## 2018-11-28 ENCOUNTER — Observation Stay (HOSPITAL_COMMUNITY): Payer: Medicaid Other | Admitting: Certified Registered"

## 2018-11-28 ENCOUNTER — Ambulatory Visit (HOSPITAL_COMMUNITY)
Admission: EM | Admit: 2018-11-28 | Discharge: 2018-11-28 | Disposition: A | Discharge status: Home / Self Care | Payer: Medicaid Other | Source: Home / Self Care | Attending: Family Medicine | Admitting: Family Medicine

## 2018-11-28 ENCOUNTER — Emergency Department (HOSPITAL_COMMUNITY): Payer: Medicaid Other

## 2018-11-28 ENCOUNTER — Encounter (HOSPITAL_COMMUNITY): Payer: Self-pay | Admitting: Emergency Medicine

## 2018-11-28 ENCOUNTER — Other Ambulatory Visit: Payer: Self-pay

## 2018-11-28 ENCOUNTER — Encounter (HOSPITAL_COMMUNITY)
Admission: EM | Disposition: A | Discharge status: Home / Self Care | Payer: Self-pay | Source: Home / Self Care | Attending: Emergency Medicine

## 2018-11-28 DIAGNOSIS — K3580 Unspecified acute appendicitis: Secondary | ICD-10-CM | POA: Diagnosis present

## 2018-11-28 DIAGNOSIS — Z170 Estrogen receptor positive status [ER+]: Secondary | ICD-10-CM | POA: Diagnosis not present

## 2018-11-28 DIAGNOSIS — Z8249 Family history of ischemic heart disease and other diseases of the circulatory system: Secondary | ICD-10-CM | POA: Diagnosis not present

## 2018-11-28 DIAGNOSIS — A6000 Herpesviral infection of urogenital system, unspecified: Secondary | ICD-10-CM | POA: Insufficient documentation

## 2018-11-28 DIAGNOSIS — Z885 Allergy status to narcotic agent status: Secondary | ICD-10-CM | POA: Diagnosis not present

## 2018-11-28 DIAGNOSIS — C773 Secondary and unspecified malignant neoplasm of axilla and upper limb lymph nodes: Secondary | ICD-10-CM | POA: Diagnosis not present

## 2018-11-28 DIAGNOSIS — G620 Drug-induced polyneuropathy: Secondary | ICD-10-CM | POA: Diagnosis not present

## 2018-11-28 DIAGNOSIS — K219 Gastro-esophageal reflux disease without esophagitis: Secondary | ICD-10-CM | POA: Diagnosis not present

## 2018-11-28 DIAGNOSIS — Z888 Allergy status to other drugs, medicaments and biological substances status: Secondary | ICD-10-CM | POA: Insufficient documentation

## 2018-11-28 DIAGNOSIS — G5603 Carpal tunnel syndrome, bilateral upper limbs: Secondary | ICD-10-CM | POA: Insufficient documentation

## 2018-11-28 DIAGNOSIS — Z8261 Family history of arthritis: Secondary | ICD-10-CM | POA: Insufficient documentation

## 2018-11-28 DIAGNOSIS — F419 Anxiety disorder, unspecified: Secondary | ICD-10-CM | POA: Insufficient documentation

## 2018-11-28 DIAGNOSIS — C50411 Malignant neoplasm of upper-outer quadrant of right female breast: Secondary | ICD-10-CM | POA: Diagnosis not present

## 2018-11-28 DIAGNOSIS — F329 Major depressive disorder, single episode, unspecified: Secondary | ICD-10-CM | POA: Insufficient documentation

## 2018-11-28 DIAGNOSIS — F17210 Nicotine dependence, cigarettes, uncomplicated: Secondary | ICD-10-CM | POA: Insufficient documentation

## 2018-11-28 DIAGNOSIS — Z820 Family history of epilepsy and other diseases of the nervous system: Secondary | ICD-10-CM | POA: Diagnosis not present

## 2018-11-28 DIAGNOSIS — K37 Unspecified appendicitis: Secondary | ICD-10-CM | POA: Diagnosis present

## 2018-11-28 DIAGNOSIS — Z791 Long term (current) use of non-steroidal anti-inflammatories (NSAID): Secondary | ICD-10-CM | POA: Insufficient documentation

## 2018-11-28 DIAGNOSIS — K5730 Diverticulosis of large intestine without perforation or abscess without bleeding: Secondary | ICD-10-CM | POA: Diagnosis not present

## 2018-11-28 DIAGNOSIS — Z818 Family history of other mental and behavioral disorders: Secondary | ICD-10-CM | POA: Insufficient documentation

## 2018-11-28 DIAGNOSIS — Z6839 Body mass index (BMI) 39.0-39.9, adult: Secondary | ICD-10-CM | POA: Insufficient documentation

## 2018-11-28 DIAGNOSIS — E669 Obesity, unspecified: Secondary | ICD-10-CM | POA: Insufficient documentation

## 2018-11-28 DIAGNOSIS — F0280 Dementia in other diseases classified elsewhere without behavioral disturbance: Secondary | ICD-10-CM | POA: Diagnosis not present

## 2018-11-28 DIAGNOSIS — M797 Fibromyalgia: Secondary | ICD-10-CM | POA: Insufficient documentation

## 2018-11-28 DIAGNOSIS — M810 Age-related osteoporosis without current pathological fracture: Secondary | ICD-10-CM | POA: Insufficient documentation

## 2018-11-28 DIAGNOSIS — R1031 Right lower quadrant pain: Secondary | ICD-10-CM | POA: Diagnosis not present

## 2018-11-28 DIAGNOSIS — Z825 Family history of asthma and other chronic lower respiratory diseases: Secondary | ICD-10-CM | POA: Insufficient documentation

## 2018-11-28 DIAGNOSIS — Z79899 Other long term (current) drug therapy: Secondary | ICD-10-CM | POA: Insufficient documentation

## 2018-11-28 DIAGNOSIS — K7689 Other specified diseases of liver: Secondary | ICD-10-CM | POA: Diagnosis not present

## 2018-11-28 DIAGNOSIS — M419 Scoliosis, unspecified: Secondary | ICD-10-CM | POA: Diagnosis not present

## 2018-11-28 DIAGNOSIS — Z8379 Family history of other diseases of the digestive system: Secondary | ICD-10-CM | POA: Insufficient documentation

## 2018-11-28 HISTORY — PX: LAPAROSCOPIC APPENDECTOMY: SHX408

## 2018-11-28 LAB — COMPREHENSIVE METABOLIC PANEL
ALBUMIN: 3.5 g/dL (ref 3.5–5.0)
ALK PHOS: 77 U/L (ref 38–126)
ALT: 13 U/L (ref 0–44)
ANION GAP: 8 (ref 5–15)
AST: 15 U/L (ref 15–41)
BUN: 8 mg/dL (ref 6–20)
CHLORIDE: 105 mmol/L (ref 98–111)
CO2: 25 mmol/L (ref 22–32)
Calcium: 9.3 mg/dL (ref 8.9–10.3)
Creatinine, Ser: 0.89 mg/dL (ref 0.44–1.00)
GFR calc non Af Amer: >60 mL/min (ref >60)
GLUCOSE: 85 mg/dL (ref 70–99)
POTASSIUM: 4 mmol/L (ref 3.5–5.1)
Sodium: 138 mmol/L (ref 135–145)
Total Bilirubin: 0.8 mg/dL (ref 0.3–1.2)
Total Protein: 7 g/dL (ref 6.5–8.1)

## 2018-11-28 LAB — LIPASE, BLOOD: Lipase: 16 U/L (ref 11–51)

## 2018-11-28 LAB — URINALYSIS, ROUTINE W REFLEX MICROSCOPIC
Bilirubin Urine: NEGATIVE
Glucose, UA: NEGATIVE mg/dL
Hgb urine dipstick: NEGATIVE
Ketones, ur: NEGATIVE mg/dL
LEUKOCYTES UA: NEGATIVE
NITRITE: NEGATIVE
PH: 6 (ref 5.0–8.0)
Protein, ur: NEGATIVE mg/dL
SPECIFIC GRAVITY, URINE: 1.006 (ref 1.005–1.030)

## 2018-11-28 LAB — CBC WITH DIFFERENTIAL/PLATELET
Abs Immature Granulocytes: 0.02 10*3/uL (ref 0.00–0.07)
BASOS ABS: 0 10*3/uL (ref 0.0–0.1)
Basophils Relative: 0 %
EOS PCT: 1 %
Eosinophils Absolute: 0.1 10*3/uL (ref 0.0–0.5)
HCT: 42.8 % (ref 36.0–46.0)
HEMOGLOBIN: 14.5 g/dL (ref 12.0–15.0)
Immature Granulocytes: 0 %
LYMPHS PCT: 17 %
Lymphs Abs: 0.8 10*3/uL (ref 0.7–4.0)
MCH: 33.6 pg (ref 26.0–34.0)
MCHC: 33.9 g/dL (ref 30.0–36.0)
MCV: 99.1 fL (ref 80.0–100.0)
Monocytes Absolute: 0.4 10*3/uL (ref 0.1–1.0)
Monocytes Relative: 8 %
NRBC: 0 % (ref 0.0–0.2)
Neutro Abs: 3.4 10*3/uL (ref 1.7–7.7)
Neutrophils Relative %: 74 %
Platelets: 253 10*3/uL (ref 150–400)
RBC: 4.32 MIL/uL (ref 3.87–5.11)
RDW: 13.1 % (ref 11.5–15.5)
WBC: 4.7 10*3/uL (ref 4.0–10.5)

## 2018-11-28 LAB — I-STAT CHEM 8, ED
BUN: 8 mg/dL (ref 6–20)
CALCIUM ION: 1.19 mmol/L (ref 1.15–1.40)
CHLORIDE: 105 mmol/L (ref 98–111)
Creatinine, Ser: 0.7 mg/dL (ref 0.44–1.00)
GLUCOSE: 81 mg/dL (ref 70–99)
HCT: 43 % (ref 36.0–46.0)
HEMOGLOBIN: 14.6 g/dL (ref 12.0–15.0)
POTASSIUM: 3.9 mmol/L (ref 3.5–5.1)
SODIUM: 139 mmol/L (ref 135–145)
TCO2: 25 mmol/L (ref 22–32)

## 2018-11-28 LAB — I-STAT BETA HCG BLOOD, ED (MC, WL, AP ONLY): HCG, QUANTITATIVE: 7.3 m[IU]/mL — AB (ref <5)

## 2018-11-28 SURGERY — APPENDECTOMY, LAPAROSCOPIC
Anesthesia: General | Site: Abdomen

## 2018-11-28 MED ORDER — DULOXETINE HCL 60 MG PO CPEP
60.0000 mg | ORAL_CAPSULE | Freq: Every day | ORAL | Status: DC
  Administered 2018-11-28 – 2018-11-29 (×2): 60 mg via ORAL
  Filled 2018-11-28 (×2): qty 1

## 2018-11-28 MED ORDER — METRONIDAZOLE IN NACL 5-0.79 MG/ML-% IV SOLN
500.0000 mg | Freq: Three times a day (TID) | INTRAVENOUS | Status: DC
  Administered 2018-11-28 – 2018-11-29 (×2): 500 mg via INTRAVENOUS
  Filled 2018-11-28 (×2): qty 100

## 2018-11-28 MED ORDER — SUGAMMADEX SODIUM 200 MG/2ML IV SOLN
INTRAVENOUS | Status: DC | PRN
  Administered 2018-11-28: 200 mg via INTRAVENOUS

## 2018-11-28 MED ORDER — DOCUSATE SODIUM 100 MG PO CAPS
100.0000 mg | ORAL_CAPSULE | Freq: Two times a day (BID) | ORAL | Status: DC
  Administered 2018-11-28 – 2018-11-29 (×2): 100 mg via ORAL
  Filled 2018-11-28 (×2): qty 1

## 2018-11-28 MED ORDER — ACETAMINOPHEN 500 MG PO TABS
1000.0000 mg | ORAL_TABLET | Freq: Four times a day (QID) | ORAL | Status: DC
  Administered 2018-11-29 (×2): 1000 mg via ORAL
  Filled 2018-11-28 (×2): qty 2

## 2018-11-28 MED ORDER — ONDANSETRON 4 MG PO TBDP: 4.0000 mg | ORAL_TABLET | Freq: Four times a day (QID) | ORAL | Status: DC | PRN

## 2018-11-28 MED ORDER — SODIUM CHLORIDE 0.9 % IV SOLN
2.0000 g | Freq: Once | INTRAVENOUS | Status: AC
  Administered 2018-11-28: 2 g via INTRAVENOUS
  Filled 2018-11-28: qty 20

## 2018-11-28 MED ORDER — PROPOFOL 10 MG/ML IV BOLUS
INTRAVENOUS | Status: AC
  Filled 2018-11-28: qty 20

## 2018-11-28 MED ORDER — BUPIVACAINE-EPINEPHRINE 0.25% -1:200000 IJ SOLN
INTRAMUSCULAR | Status: DC | PRN
  Administered 2018-11-28: 5 mL

## 2018-11-28 MED ORDER — LACTATED RINGERS IV SOLN
INTRAVENOUS | Status: DC | PRN
  Administered 2018-11-28: 19:00:00 via INTRAVENOUS

## 2018-11-28 MED ORDER — KETOROLAC TROMETHAMINE 30 MG/ML IJ SOLN: 30.0000 mg | Freq: Four times a day (QID) | INTRAMUSCULAR | Status: DC | PRN

## 2018-11-28 MED ORDER — HYDROMORPHONE HCL 1 MG/ML IJ SOLN
INTRAMUSCULAR | Status: AC
  Filled 2018-11-28: qty 1

## 2018-11-28 MED ORDER — DIPHENHYDRAMINE HCL 50 MG/ML IJ SOLN: 25.0000 mg | Freq: Four times a day (QID) | INTRAMUSCULAR | Status: DC | PRN

## 2018-11-28 MED ORDER — HYDROMORPHONE HCL 1 MG/ML IJ SOLN
0.2500 mg | INTRAMUSCULAR | Status: DC | PRN
  Administered 2018-11-28 (×2): 0.5 mg via INTRAVENOUS

## 2018-11-28 MED ORDER — ONDANSETRON HCL 4 MG/2ML IJ SOLN
INTRAMUSCULAR | Status: DC | PRN
  Administered 2018-11-28: 4 mg via INTRAVENOUS

## 2018-11-28 MED ORDER — DIPHENHYDRAMINE HCL 25 MG PO CAPS: 25.0000 mg | ORAL_CAPSULE | Freq: Four times a day (QID) | ORAL | Status: DC | PRN

## 2018-11-28 MED ORDER — EXEMESTANE 25 MG PO TABS
25.0000 mg | ORAL_TABLET | Freq: Every day | ORAL | Status: DC
  Administered 2018-11-28: 25 mg via ORAL
  Filled 2018-11-28: qty 1

## 2018-11-28 MED ORDER — DEXAMETHASONE SODIUM PHOSPHATE 10 MG/ML IJ SOLN
INTRAMUSCULAR | Status: DC | PRN
  Administered 2018-11-28: 10 mg via INTRAVENOUS

## 2018-11-28 MED ORDER — PROMETHAZINE HCL 25 MG/ML IJ SOLN: 6.2500 mg | INTRAMUSCULAR | Status: DC | PRN

## 2018-11-28 MED ORDER — METOPROLOL TARTRATE 5 MG/5ML IV SOLN: 5.0000 mg | Freq: Four times a day (QID) | INTRAVENOUS | Status: DC | PRN

## 2018-11-28 MED ORDER — FENTANYL CITRATE (PF) 250 MCG/5ML IJ SOLN
INTRAMUSCULAR | Status: DC | PRN
  Administered 2018-11-28: 50 ug via INTRAVENOUS

## 2018-11-28 MED ORDER — VALACYCLOVIR HCL 500 MG PO TABS
500.0000 mg | ORAL_TABLET | Freq: Two times a day (BID) | ORAL | Status: DC
  Administered 2018-11-28 – 2018-11-29 (×2): 500 mg via ORAL
  Filled 2018-11-28 (×2): qty 1

## 2018-11-28 MED ORDER — METRONIDAZOLE IN NACL 5-0.79 MG/ML-% IV SOLN
500.0000 mg | Freq: Once | INTRAVENOUS | Status: AC
  Administered 2018-11-28: 500 mg via INTRAVENOUS
  Filled 2018-11-28: qty 100

## 2018-11-28 MED ORDER — MORPHINE SULFATE (PF) 4 MG/ML IV SOLN
4.0000 mg | Freq: Once | INTRAVENOUS | Status: AC
Start: 2018-11-28 — End: 2018-11-28
  Administered 2018-11-28: 4 mg via INTRAVENOUS
  Filled 2018-11-28: qty 1

## 2018-11-28 MED ORDER — SODIUM CHLORIDE 0.9 % IV SOLN
2.0000 g | INTRAVENOUS | Status: DC
  Filled 2018-11-28: qty 20

## 2018-11-28 MED ORDER — ONDANSETRON HCL 4 MG/2ML IJ SOLN: 4.0000 mg | Freq: Four times a day (QID) | INTRAMUSCULAR | Status: DC | PRN

## 2018-11-28 MED ORDER — HYDROMORPHONE HCL 1 MG/ML IJ SOLN
0.5000 mg | INTRAMUSCULAR | Status: DC | PRN
  Administered 2018-11-28: 0.5 mg via INTRAVENOUS
  Filled 2018-11-28: qty 1

## 2018-11-28 MED ORDER — PROPOFOL 10 MG/ML IV BOLUS
INTRAVENOUS | Status: DC | PRN
  Administered 2018-11-28: 160 mg via INTRAVENOUS

## 2018-11-28 MED ORDER — SODIUM CHLORIDE 0.9 % IV BOLUS
1000.0000 mL | Freq: Once | INTRAVENOUS | Status: AC
  Administered 2018-11-28: 1000 mL via INTRAVENOUS

## 2018-11-28 MED ORDER — LIDOCAINE 2% (20 MG/ML) 5 ML SYRINGE
INTRAMUSCULAR | Status: DC | PRN
  Administered 2018-11-28: 40 mg via INTRAVENOUS

## 2018-11-28 MED ORDER — BISACODYL 10 MG RE SUPP: 10.0000 mg | Freq: Every day | RECTAL | Status: DC | PRN

## 2018-11-28 MED ORDER — MIDAZOLAM HCL 5 MG/5ML IJ SOLN
INTRAMUSCULAR | Status: DC | PRN
  Administered 2018-11-28: 2 mg via INTRAVENOUS

## 2018-11-28 MED ORDER — ROCURONIUM BROMIDE 10 MG/ML (PF) SYRINGE
PREFILLED_SYRINGE | INTRAVENOUS | Status: DC | PRN
  Administered 2018-11-28: 50 mg via INTRAVENOUS

## 2018-11-28 MED ORDER — ONDANSETRON HCL 4 MG/2ML IJ SOLN
4.0000 mg | Freq: Once | INTRAMUSCULAR | Status: AC
  Administered 2018-11-28: 4 mg via INTRAVENOUS
  Filled 2018-11-28: qty 2

## 2018-11-28 MED ORDER — 0.9 % SODIUM CHLORIDE (POUR BTL) OPTIME
TOPICAL | Status: DC | PRN
  Administered 2018-11-28: 1000 mL

## 2018-11-28 MED ORDER — MIDAZOLAM HCL 2 MG/2ML IJ SOLN
INTRAMUSCULAR | Status: AC
  Filled 2018-11-28: qty 2

## 2018-11-28 MED ORDER — METHOCARBAMOL 500 MG PO TABS: 500.0000 mg | ORAL_TABLET | Freq: Four times a day (QID) | ORAL | Status: DC | PRN

## 2018-11-28 MED ORDER — IOHEXOL 300 MG/ML  SOLN
100.0000 mL | Freq: Once | INTRAMUSCULAR | Status: AC | PRN
  Administered 2018-11-28: 100 mL via INTRAVENOUS

## 2018-11-28 MED ORDER — EXEMESTANE 25 MG PO TABS
25.0000 mg | ORAL_TABLET | Freq: Every day | ORAL | Status: DC
  Filled 2018-11-28: qty 1

## 2018-11-28 MED ORDER — FENTANYL CITRATE (PF) 250 MCG/5ML IJ SOLN
INTRAMUSCULAR | Status: AC
  Filled 2018-11-28: qty 5

## 2018-11-28 MED ORDER — BUPIVACAINE-EPINEPHRINE (PF) 0.25% -1:200000 IJ SOLN
INTRAMUSCULAR | Status: AC
  Filled 2018-11-28: qty 30

## 2018-11-28 MED ORDER — SODIUM CHLORIDE 0.9 % IV SOLN
INTRAVENOUS | Status: DC
  Administered 2018-11-28 – 2018-11-29 (×3): via INTRAVENOUS

## 2018-11-28 MED ORDER — TRAMADOL HCL 50 MG PO TABS: 50.0000 mg | ORAL_TABLET | Freq: Four times a day (QID) | ORAL | Status: DC | PRN

## 2018-11-28 MED ORDER — HYDRALAZINE HCL 20 MG/ML IJ SOLN: 10.0000 mg | INTRAMUSCULAR | Status: DC | PRN

## 2018-11-28 MED ORDER — MIDAZOLAM HCL 2 MG/2ML IJ SOLN: 0.5000 mg | Freq: Once | INTRAMUSCULAR | Status: DC | PRN

## 2018-11-28 MED ORDER — MEPERIDINE HCL 50 MG/ML IJ SOLN: 6.2500 mg | INTRAMUSCULAR | Status: DC | PRN

## 2018-11-28 MED ORDER — SODIUM CHLORIDE 0.9 % IR SOLN
Status: DC | PRN
  Administered 2018-11-28: 1000 mL

## 2018-11-28 MED ORDER — FAMOTIDINE 20 MG PO TABS
20.0000 mg | ORAL_TABLET | Freq: Two times a day (BID) | ORAL | Status: DC
  Administered 2018-11-28 – 2018-11-29 (×2): 20 mg via ORAL
  Filled 2018-11-28 (×2): qty 1

## 2018-11-28 SURGICAL SUPPLY — 44 items
ADH SKN CLS APL DERMABOND .7 (GAUZE/BANDAGES/DRESSINGS) ×1
APPLIER CLIP 5 13 M/L LIGAMAX5 (MISCELLANEOUS)
APR CLP MED LRG 5 ANG JAW (MISCELLANEOUS)
BAG SPEC RTRVL LRG 6X4 10 (ENDOMECHANICALS) ×1
BLADE CLIPPER SURG (BLADE) IMPLANT
CANISTER SUCT 3000ML PPV (MISCELLANEOUS) ×2 IMPLANT
CHLORAPREP W/TINT 26ML (MISCELLANEOUS) ×2 IMPLANT
CLIP APPLIE 5 13 M/L LIGAMAX5 (MISCELLANEOUS) IMPLANT
COVER SURGICAL LIGHT HANDLE (MISCELLANEOUS) ×2 IMPLANT
COVER WAND RF STERILE (DRAPES) ×2 IMPLANT
CUTTER ENDO LINEAR 45M (STAPLE) ×2 IMPLANT
DERMABOND ADVANCED (GAUZE/BANDAGES/DRESSINGS) ×1
DERMABOND ADVANCED .7 DNX12 (GAUZE/BANDAGES/DRESSINGS) ×1 IMPLANT
DEVICE PMI PUNCTURE CLOSURE (MISCELLANEOUS) ×2 IMPLANT
ELECT REM PT RETURN 9FT ADLT (ELECTROSURGICAL) ×2
ELECTRODE REM PT RTRN 9FT ADLT (ELECTROSURGICAL) ×1 IMPLANT
GLOVE BIO SURGEON STRL SZ 6 (GLOVE) ×2 IMPLANT
GLOVE INDICATOR 6.5 STRL GRN (GLOVE) ×2 IMPLANT
GOWN STRL REUS W/ TWL LRG LVL3 (GOWN DISPOSABLE) ×3 IMPLANT
GOWN STRL REUS W/TWL LRG LVL3 (GOWN DISPOSABLE) ×6
KIT BASIN OR (CUSTOM PROCEDURE TRAY) ×2 IMPLANT
KIT TURNOVER KIT B (KITS) ×2 IMPLANT
NDL INSUFFLATION 14GA 120MM (NEEDLE) ×1 IMPLANT
NEEDLE INSUFFLATION 14GA 120MM (NEEDLE) ×2 IMPLANT
NS IRRIG 1000ML POUR BTL (IV SOLUTION) ×2 IMPLANT
PAD ARMBOARD 7.5X6 YLW CONV (MISCELLANEOUS) ×4 IMPLANT
POUCH SPECIMEN RETRIEVAL 10MM (ENDOMECHANICALS) ×2 IMPLANT
RELOAD 45 VASCULAR/THIN (ENDOMECHANICALS) IMPLANT
RELOAD STAPLE 45 2.5 WHT GRN (ENDOMECHANICALS) IMPLANT
RELOAD STAPLE 45 3.5 BLU ETS (ENDOMECHANICALS) IMPLANT
RELOAD STAPLE TA45 3.5 REG BLU (ENDOMECHANICALS) ×2 IMPLANT
SCISSORS ENDO CVD 5DCS (MISCELLANEOUS) IMPLANT
SET IRRIG TUBING LAPAROSCOPIC (IRRIGATION / IRRIGATOR) ×2 IMPLANT
SHEARS HARMONIC ACE PLUS 36CM (ENDOMECHANICALS) IMPLANT
SLEEVE ENDOPATH XCEL 5M (ENDOMECHANICALS) ×2 IMPLANT
SPECIMEN JAR SMALL (MISCELLANEOUS) ×2 IMPLANT
SUT MNCRL AB 4-0 PS2 18 (SUTURE) ×2 IMPLANT
TOWEL OR 17X24 6PK STRL BLUE (TOWEL DISPOSABLE) ×2 IMPLANT
TRAY FOLEY CATH SILVER 16FR (SET/KITS/TRAYS/PACK) ×2 IMPLANT
TRAY LAPAROSCOPIC MC (CUSTOM PROCEDURE TRAY) ×2 IMPLANT
TROCAR XCEL 12X100 BLDLESS (ENDOMECHANICALS) ×2 IMPLANT
TROCAR XCEL NON-BLD 5MMX100MML (ENDOMECHANICALS) ×2 IMPLANT
TUBING INSUFFLATION (TUBING) ×2 IMPLANT
WATER STERILE IRR 1000ML POUR (IV SOLUTION) ×2 IMPLANT

## 2018-11-28 NOTE — H&P (Signed)
[facility] Surgery Admission Note  April [name] 1961/05/13  [id number].    Requesting MD: Deno Etienne Chief Complaint/Reason for Consult: acute appendicitis  HPI:  April [name] is a 57yo female who presented to the [facility] earlier today with 2 days of worsening abdominal pain. States that she work up on 15/40 with periumbilical abdominal pain. Since that time it has migrated to her RLQ and is associated with nausea and acid reflux. Denies fever, chills, emesis. Pain is worse with palpation and movement. Thought that she was constipation so she took 4 doses of some over the counter laxative; she had a normal BM this morning without relief of her pain. She also tried Peptobismal but this did not help so she decided to come to the ED. ED workup included a CT scan which shows thickening and inflammatory changes of the appendix compatible with acute appendicitis without evidence for rupture or abscess. WBC 4.7, afebrile, VSS.  Patient was started on IV rocephin/flagyl and general surgery asked to see.  She is NPO today except for some coffee at 0830.  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   HENT: Negative.   Eyes: Negative.   Cardiovascular: Negative.   Gastrointestinal: Positive for abdominal pain and nausea. Negative for constipation, diarrhea and vomiting.  Genitourinary: Negative.   Musculoskeletal: Negative.   Skin: Negative.   Neurological: Negative.    All systems reviewed and otherwise negative except for as above  Family History  Problem Relation Age of Onset  . Arthritis Mother   . Hypertension Mother   . Heart disease Mother   . Dementia Mother   . Irritable bowel syndrome Mother   . Emphysema  Father   . Cancer Father        bladder  . Cerebral aneurysm Father        ruptured aneurysm was cause of death  . Bipolar disorder Daughter        Not clear if this is the case.  Possibly Bipolar II  . Depression Daughter   . Graves' disease Sister   . Vitiligo Sister   . Mental illness Brother        Depression  . Mental illness Sister        likely undiagnosed schizophrenia  . Mental illness Brother        Schizophrenia    Past Medical History:  Diagnosis Date  . Allergy 07/28/2012   Seasonal/Environmental allergies  . Anxiety 2013   Since 2013  . Arthritis 2014 per patient    knees and shoulders  . Bilateral ankle fractures 07/2015   Booted and resolved   . Cancer [facility]) dx June 22, 2016   right breast  . Depression 2013   Multiple  episodes  in past.  . Elevated cholesterol 2017  . Fibromyalgia 2013   diagnosed by Dr. [name]  . Genital herpes 2005   Has outbreaks monthly if not on preventative medication  . GERD (gastroesophageal reflux disease) 2013  . History of radiation therapy 02/07/17- 03/21/17   Right Breast- 4 field 25 fractions. 50 Gy to SCLV/PAB in 25 fractions. Right Breast Boost 10 gy in 5 fractions.  . Migraine 2013   migraines  . 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 Obesity   . Osteoporosis 03/23/2017   noted per bone density scan  . Peripheral neuropathy 08/13/2017  . Personal history of chemotherapy 11/2016  . Personal history of radiation therapy    4/18  . Right wrist fracture 06/2015   Resolved  . Scoliosis of thoracic spine 01/04/2012  . Skin condition 2012   patient reports periodic episodes of severe itching. She will itch and then blister at areas including her arms, back, and buttocks.   . Urinary, incontinence, stress female 07/14/2016   patient reported    Past Surgical History:  Procedure Laterality Date  . AXILLARY LYMPH NODE DISSECTION Right 12/26/2016    Procedure: RIGHT AXILLARY LYMPH NODE DISSECTION;  Surgeon: Excell Seltzer, MD;  Location: [place];  Service: General;  Laterality: Right;  . BREAST LUMPECTOMY Right 2018  . BREAST LUMPECTOMY WITH NEEDLE LOCALIZATION Right 12/19/2016   Procedure: RIGHT BREAST NEEDLE LOCALIZED LUMPECTOMY, RIGHT RADIOACTIVE SEED TARGETED AXILLARY SENTINEL LYMPH NODE BIOPSY;  Surgeon: Excell Seltzer, MD;  Location: [place];  Service: General;  Laterality: Right;  . IR GENERIC HISTORICAL  10/09/2016   IR CV LINE INJECTION 10/09/2016 Aletta Edouard, MD [facility]-INTERV RAD  . PORT-A-CATH REMOVAL Left 12/19/2016   Procedure: REMOVAL PORT-A-CATH;  Surgeon: Excell Seltzer, MD;  Location: [place];  Service: General;  Laterality: Left;  . PORTACATH PLACEMENT N/A 07/11/2016   Procedure: INSERTION PORT-A-CATH;  Surgeon: Excell Seltzer, MD;  Location: [facility];  Service: General;  Laterality: N/A;  . RADIOACTIVE SEED GUIDED AXILLARY SENTINEL LYMPH NODE Right 12/19/2016   Procedure: RADIOACTIVE SEED GUIDED AXILLARY SENTINEL LYMPH NODE BIOPSY;  Surgeon: Excell Seltzer, MD;  Location: [place];  Service: General;  Laterality: Right;  . WISDOM TOOTH EXTRACTION  yrs ago    Social History:  reports that she has been smoking cigarettes. She has a 3.75 pack-year smoking history. She has never used smokeless tobacco. She reports current alcohol use of about 2.0 - 4.0 standard drinks of alcohol per week. She reports current drug use. Drug: Marijuana.  Allergies:  Allergies  Allergen Reactions  . Hydrocodone Nausea Only and Other (See Comments)    dizziness  . Ultram [Tramadol Hcl] Nausea Only  . Gabapentin Rash    (Not in a hospital admission)   Prior to Admission medications   Medication Sig Start Date End Date Taking? Authorizing Provider  acetaminophen (TYLENOL 8 HOUR) 650 MG CR tablet Take 650 mg by mouth 2 (two) times daily as needed for pain. 02/28/18   Yes [provider]  butalbital-acetaminophen-caffeine (FIORICET, [place]) 50-325-40 MG tablet TAKE 2 TABLETS BY MOUTH EVERY 6 HOURS AS NEEDED FOR HEADACHE/MIGRAINE Patient taking differently: Take 2 tablets by mouth every 6 (six) hours as needed for migraine. TAKE 2 TABLETS BY MOUTH EVERY 6 HOURS AS NEEDED FOR HEADACHE/MIGRAINE 10/23/17  Yes Magrinat, Virgie Dad, MD  Calcium Carb-Cholecalciferol (CALCIUM/VITAMIN D) 600-400 MG-UNIT TABS Take 2 tablets by mouth daily. Reports starting January 2018   Yes [provider]  cetirizine (ZYRTEC) 10 MG tablet Take 10 mg by mouth daily.   Yes [provider]  DULoxetine (CYMBALTA) 60 MG capsule TAKE 1 CAPSULE BY MOUTH ONCE DAILY 11/20/18  Yes Magrinat, Virgie Dad, MD  ECHINACEA PO Take 2 capsules by mouth 2 (two) times daily as needed (Only takes when she is getting a cold).  07/02/17  Yes [provider]  exemestane (AROMASIN) 25 MG tablet Take 1 tablet (25 mg total) by  mouth daily after breakfast. 02/11/18  Yes Magrinat, Virgie Dad, MD  fluticasone (FLONASE) 50 MCG/ACT nasal spray Place 2 sprays into both nostrils daily. Patient taking differently: Place 2 sprays into both nostrils as needed for allergies or rhinitis.  05/15/18  Yes [name], MD  ibuprofen (ADVIL,MOTRIN) 200 MG tablet Take 600 mg by mouth as needed for headache or moderate pain.   Yes [provider]  Phenylephrine-DM-GG (MUCINEX FAST-MAX CONGEST COUGH) 2.5-5-100 MG/5ML LIQD Take 5 mLs by mouth as needed (cough). 09/04/18  Yes [provider]  ranitidine (ZANTAC) 150 MG capsule Take 150 mg by mouth daily.    Yes [provider]  valACYclovir (VALTREX) 1000 MG tablet 1/2 tab by mouth twice daily 08/14/18  Yes [name], MD  cyclobenzaprine (FLEXERIL) 10 MG tablet Take 1 tablet (10 mg total) by mouth 2 (two) times daily as needed for muscle spasms. Patient not taking: Reported on 11/28/2018 08/14/18   [name], MD   ibuprofen (ADVIL,MOTRIN) 800 MG tablet TAKE 1 TABLET BY MOUTH EVERY 8 HOURS AS NEEDED. Patient not taking: Reported on 11/28/2018 10/16/16   Magrinat, Virgie Dad, MD    Blood pressure 128/79, pulse 96, temperature 98.6 F (37 C), temperature source Oral, resp. rate 18, height 5\' 4"  (1.626 m), weight 104.3 kg, SpO2 98 %. Physical Exam: General: pleasant, WD/WN white female who is laying in bed in NAD HEENT: head is normocephalic, atraumatic.  Sclera are noninjected.  Pupils equal and round.  Ears and nose without any masses or lesions.  Mouth is pink and moist. Dentition fair Heart: regular, rate, and rhythm.  No obvious murmurs, gallops, or rubs noted.  Palpable pedal pulses bilaterally Lungs: CTAB, no wheezes, rhonchi, or rales noted.  Respiratory effort nonlabored Abd: soft, ND, +BS, no masses, hernias, or organomegaly. +TTP RLQ, no peritonitis MS: all 4 extremities are symmetrical with no cyanosis, clubbing, or edema. Skin: warm and dry with no masses, lesions, or rashes Psych: A&Ox3 with an appropriate affect. Neuro: cranial nerves grossly intact, extremity CSM intact bilaterally, normal speech  Results for orders placed or performed during the hospital encounter of 11/28/18 (from the past 48 hour(s))  Urinalysis, Routine w reflex microscopic     Status: None   Collection Time: 11/28/18  1:03 PM  Result Value Ref Range   Color, Urine YELLOW YELLOW   APPearance CLEAR CLEAR   Specific Gravity, Urine 1.006 1.005 - 1.030   pH 6.0 5.0 - 8.0   Glucose, UA NEGATIVE NEGATIVE mg/dL   Hgb urine dipstick NEGATIVE NEGATIVE   Bilirubin Urine NEGATIVE NEGATIVE   Ketones, ur NEGATIVE NEGATIVE mg/dL   Protein, ur NEGATIVE NEGATIVE mg/dL   Nitrite NEGATIVE NEGATIVE   Leukocytes, UA NEGATIVE NEGATIVE    Comment: Performed at [facility] [address], [place], [place] [address]  I-Stat Beta hCG blood, ED ([facility], [facility], [facility] only)     Status: Abnormal   Collection Time: 11/28/18  1:09 PM  Result  Value Ref Range   I-stat hCG, quantitative 7.3 (H) <5 mIU/mL   Comment 3            Comment:   GEST. AGE      CONC.  (mIU/mL)   <=1 WEEK        5 - 50     2 WEEKS       50 - 500     3 WEEKS       100 - 10,000  4 WEEKS     1,000 - 30,000        FEMALE AND NON-PREGNANT FEMALE:     LESS THAN 5 mIU/mL   I-Stat Chem 8, ED     Status: None   Collection Time: 11/28/18  1:11 PM  Result Value Ref Range   Sodium 139 135 - 145 mmol/L   Potassium 3.9 3.5 - 5.1 mmol/L   Chloride 105 98 - 111 mmol/L   BUN 8 6 - 20 mg/dL   Creatinine, Ser 0.70 0.44 - 1.00 mg/dL   Glucose, Bld 81 70 - 99 mg/dL   Calcium, Ion 1.19 1.15 - 1.40 mmol/L   TCO2 25 22 - 32 mmol/L   Hemoglobin 14.6 12.0 - 15.0 g/dL   HCT 43.0 36.0 - 46.0 %  CBC with Differential     Status: None   Collection Time: 11/28/18  1:15 PM  Result Value Ref Range   WBC 4.7 4.0 - 10.5 K/uL   RBC 4.32 3.87 - 5.11 MIL/uL   Hemoglobin 14.5 12.0 - 15.0 g/dL   HCT 42.8 36.0 - 46.0 %   MCV 99.1 80.0 - 100.0 fL   MCH 33.6 26.0 - 34.0 pg   MCHC 33.9 30.0 - 36.0 g/dL   RDW 13.1 11.5 - 15.5 %   Platelets 253 150 - 400 K/uL   nRBC 0.0 0.0 - 0.2 %   Neutrophils Relative % 74 %   Neutro Abs 3.4 1.7 - 7.7 K/uL   Lymphocytes Relative 17 %   Lymphs Abs 0.8 0.7 - 4.0 K/uL   Monocytes Relative 8 %   Monocytes Absolute 0.4 0.1 - 1.0 K/uL   Eosinophils Relative 1 %   Eosinophils Absolute 0.1 0.0 - 0.5 K/uL   Basophils Relative 0 %   Basophils Absolute 0.0 0.0 - 0.1 K/uL   Immature Granulocytes 0 %   Abs Immature Granulocytes 0.02 0.00 - 0.07 K/uL    Comment: Performed at [facility], [address], [place], [place] [address]  Comprehensive metabolic panel     Status: None   Collection Time: 11/28/18  1:15 PM  Result Value Ref Range   Sodium 138 135 - 145 mmol/L   Potassium 4.0 3.5 - 5.1 mmol/L   Chloride 105 98 - 111 mmol/L   CO2 25 22 - 32 mmol/L   Glucose, Bld 85 70 - 99 mg/dL   BUN 8 6 - 20 mg/dL   Creatinine, Ser 0.89 0.44 - 1.00  mg/dL   Calcium 9.3 8.9 - 10.3 mg/dL   Total Protein 7.0 6.5 - 8.1 g/dL   Albumin 3.5 3.5 - 5.0 g/dL   AST 15 15 - 41 U/L   ALT 13 0 - 44 U/L   Alkaline Phosphatase 77 38 - 126 U/L   Total Bilirubin 0.8 0.3 - 1.2 mg/dL   GFR calc non Af Amer >60 >60 mL/min   GFR calc Af Amer >60 >60 mL/min   Anion gap 8 5 - 15    Comment: Performed at [facility] [address], [place], [place] [address]  Lipase, blood     Status: None   Collection Time: 11/28/18  1:15 PM  Result Value Ref Range   Lipase 16 11 - 51 U/L    Comment: Performed at [facility] [address] [place], [place] [address]   Ct Abdomen Pelvis W Contrast  Result Date: 11/28/2018 CLINICAL DATA:  Right lower quadrant abdominal pain. Pain  for 2 days. Appendicitis suspected. EXAM: CT ABDOMEN AND PELVIS WITH CONTRAST TECHNIQUE: Multidetector CT imaging of the abdomen and pelvis was performed using the standard protocol following bolus administration of intravenous contrast. CONTRAST:  162mL OMNIPAQUE IOHEXOL 300 MG/ML  SOLN COMPARISON:  CT of the abdomen and pelvis 05/04/2017 FINDINGS: Lower chest: Scarring along the right minor fissure is stable. No other focal nodule, mass, or airspace disease is present. Heart size is normal. No significant pleural or pericardial effusion is present. Hepatobiliary: A subcentimeter cyst in the left lobe of the liver is stable. No other focal lesions are present. The common bile duct and gallbladder normal. Pancreas: Unremarkable. No pancreatic ductal dilatation or surrounding inflammatory changes. Spleen: Normal in size without focal abnormality. Adrenals/Urinary Tract: The adrenal glands are normal. Kidneys and ureters are within normal limits. The urinary bladder is normal. Stomach/Bowel: The stomach and duodenum are within normal limits. The small bowel is within normal limits. Terminal ileum is unremarkable. The appendix is enlarged and inflamed. It measures up to 11 mm in thickness. No  appendicolith is visualized. There is no free fluid or free air. The ascending and transverse colon are within normal limits. Descending colon is normal. Diverticular changes are present in the sigmoid colon. No focal inflammatory changes are present. Vascular/Lymphatic: No significant atherosclerotic changes are present. There is no aneurysm. No significant adenopathy is present. Reproductive: Uterus and bilateral adnexa are unremarkable. Other: No abdominal wall hernia or abnormality. No abdominopelvic ascites. Musculoskeletal: Endplate degenerative changes are present at L4-5 at L5-S1. Foraminal narrowing is greatest at L4-5. The bony pelvis is within normal limits. Hips are located and normal. IMPRESSION: 1. Thickening and inflammatory changes of the appendix compatible with acute appendicitis. There is no evidence for rupture or abscess. 2. Sigmoid diverticulosis without diverticulitis. 3. Stable scarring along the right minor fissure. 4. Stable subcentimeter cyst in the left lobe of the liver. Electronically Signed   By: San Morelle M.D.   On: 11/28/2018 14:37   Anti-infectives (From admission, onward)   Start     Dose/Rate Route Frequency Ordered Stop   11/29/18 1600  cefTRIAXone (ROCEPHIN) 2 g in sodium chloride 0.9 % 100 mL IVPB     2 g 200 mL/hr over 30 Minutes Intravenous Every 24 hours 11/28/18 1519     11/28/18 2200  metroNIDAZOLE (FLAGYL) IVPB 500 mg     500 mg 100 mL/hr over 60 Minutes Intravenous Every 8 hours 11/28/18 1519     11/28/18 1445  cefTRIAXone (ROCEPHIN) 2 g in sodium chloride 0.9 % 100 mL IVPB     2 g 200 mL/hr over 30 Minutes Intravenous  Once 11/28/18 1442 11/28/18 1552   11/28/18 1445  metroNIDAZOLE (FLAGYL) IVPB 500 mg     500 mg 100 mL/hr over 60 Minutes Intravenous  Once 11/28/18 1442          Assessment/Plan Breast cancer - s/p lumpectomy and chemo/radiation, on exemestane Osteoporosis Fibromyalgia Depression Tobacco abuse  Acute appendicitis -  Patient with acute appendicitis, appears uncomplicated on CT scan.  Keep NPO and start IV rocephin/flagyl. Plan for laparoscopic appendectomy tonight.   Wellington Hampshire, [facility] Surgery 11/28/2018, 3:45 PM Pager: [phone] Mon 7:00 am -11:30 AM Tues-Fri 7:00 am-4:30 pm Sat-Sun 7:00 am-11:30 am

## 2018-11-28 NOTE — ED Notes (Signed)
Patient transported to CT 

## 2018-11-28 NOTE — ED Provider Notes (Signed)
[facility] EMERGENCY DEPARTMENT Provider Note   CSN: [id number] Arrival date & time: 11/28/18  1208     History   Chief Complaint Chief Complaint  Patient presents with  . Abdominal Pain    HPI April [name] is a 57 y.o. female.  57 yo F with a chief complaint of lower abdominal pain.  Started as some diffuse cramping and now is more in the right lower quadrant than left.  She has been taking anti-constipated medications at home and has had multiple bowel movements without improvement.  Some subjective fevers and chills.  Nausea but no vomiting.  She feels that the pain is worse with movement ambulation and bending over at the waist.  She denies trauma.  Denies urinary symptoms.  She has had diverticuli in the past and she is worried that she may have diverticulitis.  Was seen at urgent care and sent here for concern for appendicitis.  The history is provided by the patient.  Abdominal Pain   This is a new problem. The current episode started 2 days ago. The problem occurs constantly. The problem has been gradually worsening. The pain is associated with an unknown factor. The pain is located in the RLQ. The quality of the pain is cramping, dull and sharp. The pain is at a severity of 7/10. The pain is moderate. Associated symptoms include diarrhea. Pertinent negatives include fever, nausea, vomiting, dysuria, headaches, arthralgias and myalgias. Nothing aggravates the symptoms. Nothing relieves the symptoms.    Past Medical History:  Diagnosis Date  . Allergy 07/28/2012   Seasonal/Environmental allergies  . Anxiety 2013   Since 2013  . Arthritis 2014 per patient    knees and shoulders  . Bilateral ankle fractures 07/2015   Booted and resolved   . Cancer [facility]) dx June 22, 2016   right breast  . Depression 2013   Multiple  episodes  in past.  . Elevated cholesterol 2017  . Fibromyalgia 2013   diagnosed by Dr. [name]  . Genital herpes 2005   Has  outbreaks monthly if not on preventative medication  . GERD (gastroesophageal reflux disease) 2013  . History of radiation therapy 02/07/17- 03/21/17   Right Breast- 4 field 25 fractions. 50 Gy to SCLV/PAB in 25 fractions. Right Breast Boost 10 gy in 5 fractions.  . Migraine 2013   migraines  . Neuromuscular disorder ([place]) 03/20/2017   neuropathy in fingers and toes from Chemo--intermittent  . Obesity   . Osteoporosis 03/23/2017   noted per bone density scan  . Peripheral neuropathy 08/13/2017  . Personal history of chemotherapy 11/2016  . Personal history of radiation therapy    4/18  . Right wrist fracture 06/2015   Resolved  . Scoliosis of thoracic spine 01/04/2012  . Skin condition 2012   patient reports periodic episodes of severe itching. She will itch and then blister at areas including her arms, back, and buttocks.   . Urinary, incontinence, stress female 07/14/2016   patient reported    Patient Active Problem List   Diagnosis Date Noted  . Appendicitis 11/28/2018  . De Quervain's disease (tenosynovitis) 05/15/2018  . Bilateral carpal tunnel syndrome 05/15/2018  . 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 Peripheral neuropathy 08/13/2017  . Osteoporosis 03/23/2017  . Breast cancer metastasized to axillary lymph node ([place]) 06/21/2016  . Malignant neoplasm of upper-outer quadrant of right breast in female, estrogen receptor positive ([place]) 06/21/2016  . Elevated blood pressure reading without diagnosis of  hypertension 04/28/2016  . Herpes genitalis--since 2005 per patient   . Environmental and seasonal allergies 07/28/2012  . Fibromyalgia   . Anxiety 02/07/2012  . Depression (emotion) 01/04/2012  . Headache-migranes 01/04/2012    Past Surgical History:  Procedure Laterality Date  . AXILLARY LYMPH NODE DISSECTION Right 12/26/2016   Procedure: RIGHT AXILLARY LYMPH NODE DISSECTION;  Surgeon: Excell Seltzer, MD;  Location: [facility];   Service: General;  Laterality: Right;  . BREAST LUMPECTOMY Right 2018  . BREAST LUMPECTOMY WITH NEEDLE LOCALIZATION Right 12/19/2016   Procedure: RIGHT BREAST NEEDLE LOCALIZED LUMPECTOMY, RIGHT RADIOACTIVE SEED TARGETED AXILLARY SENTINEL LYMPH NODE BIOPSY;  Surgeon: Excell Seltzer, MD;  Location: [facility];  Service: General;  Laterality: Right;  . IR GENERIC HISTORICAL  10/09/2016   IR CV LINE INJECTION 10/09/2016 Aletta Edouard, MD [facility]-INTER[facility] RAD  . PORT-A-CATH REMOVAL Left 12/19/2016   Procedure: REMOVAL PORT-A-CATH;  Surgeon: Excell Seltzer, MD;  Location: [place];  Service: General;  Laterality: Left;  . PORTACATH PLACEMENT N/A 07/11/2016   Procedure: INSERTION PORT-A-CATH;  Surgeon: Excell Seltzer, MD;  Location: [facility];  Service: General;  Laterality: N/A;  . RADIOACTIVE SEED GUIDED AXILLARY SENTINEL LYMPH NODE Right 12/19/2016   Procedure: RADIOACTIVE SEED GUIDED AXILLARY SENTINEL LYMPH NODE BIOPSY;  Surgeon: Excell Seltzer, MD;  Location: [facility];  Service: General;  Laterality: Right;  . WISDOM TOOTH EXTRACTION  yrs ago     OB History    Gravida  3   Para  1   Term  1   Preterm      AB  2   Living  1     SAB      TAB  2   Ectopic      Multiple      Live Births               Home Medications    Prior to Admission medications   Medication Sig Start Date End Date Taking? Authorizing Provider  acetaminophen (TYLENOL 8 HOUR) 650 MG CR tablet Take 650 mg by mouth 2 (two) times daily as needed for pain. 02/28/18  Yes [provider]  butalbital-acetaminophen-caffeine (FIORICET, ESGIC) 50-325-40 MG tablet TAKE 2 TABLETS BY MOUTH EVERY 6 HOURS AS NEEDED FOR HEADACHE/MIGRAINE 10/23/17  Yes Magrinat, Virgie Dad, MD  Calcium Carb-Cholecalciferol (CALCIUM/VITAMIN D) 600-400 MG-UNIT TABS Take 2 tablets by mouth daily. Reports starting January 2018   Yes [provider]  cetirizine (ZYRTEC) 10 MG  tablet Take 10 mg by mouth daily.   Yes [provider]  DULoxetine (CYMBALTA) 60 MG capsule TAKE 1 CAPSULE BY MOUTH ONCE DAILY 11/20/18  Yes Magrinat, Virgie Dad, MD  exemestane (AROMASIN) 25 MG tablet Take 1 tablet (25 mg total) by mouth daily after breakfast. 02/11/18  Yes Magrinat, Virgie Dad, MD  Phenylephrine-DM-GG (MUCINEX FAST-MAX CONGEST COUGH) 2.5-5-100 MG/5ML LIQD Take 5 mLs by mouth as needed (cough). 09/04/18  Yes [provider]  ranitidine (ZANTAC) 150 MG capsule Take 150 mg by mouth daily.    Yes [provider]  valACYclovir (VALTREX) 1000 MG tablet 1/2 tab by mouth twice daily 08/14/18  Yes [name], MD  cyclobenzaprine (FLEXERIL) 10 MG tablet Take 1 tablet (10 mg total) by mouth 2 (two) times daily as needed for muscle spasms. Patient not taking: Reported on 11/28/2018 08/14/18   [name], MD  ECHINACEA PO Take 2 capsules by mouth 2 (two) times daily as needed (Only takes  when she is getting a cold).  07/02/17   [provider]  fluticasone (FLONASE) 50 MCG/ACT nasal spray Place 2 sprays into both nostrils daily. Patient taking differently: Place 2 sprays into both nostrils as needed for allergies or rhinitis.  05/15/18   [name], MD  ibuprofen (ADVIL,MOTRIN) 800 MG tablet TAKE 1 TABLET BY MOUTH EVERY 8 HOURS AS NEEDED. Patient not taking: Reported on 11/28/2018 10/16/16   Magrinat, Virgie Dad, MD    Family History Family History  Problem Relation Age of Onset  . Arthritis Mother   . Hypertension Mother   . Heart disease Mother   . Dementia Mother   . Irritable bowel syndrome Mother   . Emphysema Father   . Cancer Father        bladder  . Cerebral aneurysm Father        ruptured aneurysm was cause of death  . Bipolar disorder Daughter        Not clear if this is the case.  Possibly Bipolar II  . Depression Daughter   . Graves' disease Sister   . Vitiligo Sister   . Mental illness Brother        Depression    . Mental illness Sister        likely undiagnosed schizophrenia  . Mental illness Brother        Schizophrenia    Social History Social History   Tobacco Use  . Smoking status: Current Some Day Smoker    Packs/day: 0.25    Years: 15.00    Pack years: 3.75    Types: Cigarettes    Last attempt to quit: 01/21/1994    Years since quitting: 24.8  . Smokeless tobacco: Never Used  Substance Use Topics  . Alcohol use: Yes    Alcohol/week: 2.0 - 4.0 standard drinks    Types: 2 - 4 Standard drinks or equivalent per week    Comment: rarely  . Drug use: Yes    Types: Marijuana    Comment: last smoked 6 months ago     Allergies   Hydrocodone; Ultram [tramadol hcl]; and Gabapentin   Review of Systems Review of Systems  Constitutional: Negative for chills and fever.  HENT: Negative for congestion and rhinorrhea.   Eyes: Negative for redness and visual disturbance.  Respiratory: Negative for shortness of breath and wheezing.   Cardiovascular: Negative for chest pain and palpitations.  Gastrointestinal: Positive for abdominal pain and diarrhea. Negative for nausea and vomiting.  Genitourinary: Negative for dysuria and urgency.  Musculoskeletal: Negative for arthralgias and myalgias.  Skin: Negative for pallor and wound.  Neurological: Negative for dizziness and headaches.     Physical Exam Updated Vital Signs BP 128/79   Pulse 96   Temp 98.6 F (37 C) (Oral)   Resp 18   Ht 5\' 4"  (1.626 m)   Wt 104.3 kg   SpO2 98%   BMI 39.48 kg/m   Physical Exam Vitals signs and nursing note reviewed.  Constitutional:      General: She is not in acute distress.    Appearance: She is well-developed. She is not diaphoretic.  HENT:     Head: Normocephalic and atraumatic.  Eyes:     Pupils: Pupils are equal, round, and reactive to light.  Neck:     Musculoskeletal: Normal range of motion and neck supple.  Cardiovascular:     Rate and Rhythm: Normal rate and regular rhythm.      Heart sounds: No murmur.  No friction rub. No gallop.   Pulmonary:     Effort: Pulmonary effort is normal.     Breath sounds: No wheezing or rales.  Abdominal:     General: There is no distension.     Palpations: Abdomen is soft.     Tenderness: There is abdominal tenderness. Positive signs include McBurney's sign. Negative signs include Murphy's sign, Rovsing's sign, psoas sign and obturator sign.  Musculoskeletal:        General: No tenderness.  Skin:    General: Skin is warm and dry.  Neurological:     Mental Status: She is alert and oriented to person, place, and time.  Psychiatric:        Behavior: Behavior normal.      ED Treatments / Results  Labs (all labs ordered are listed, but only abnormal results are displayed) Labs Reviewed  I-STAT BETA HCG BLOOD, ED ([facility], [facility], [facility] ONLY) - Abnormal; Notable for the following components:      Result Value   I-stat hCG, quantitative 7.3 (*)    All other components within normal limits  CBC WITH DIFFERENTIAL/PLATELET  COMPREHENSIVE METABOLIC PANEL  LIPASE, BLOOD  URINALYSIS, ROUTINE W REFLEX MICROSCOPIC  HIV ANTIBODY (ROUTINE TESTING W REFLEX)  I-STAT CHEM 8, ED    EKG None  Radiology Ct Abdomen Pelvis W Contrast  Result Date: 11/28/2018 CLINICAL DATA:  Right lower quadrant abdominal pain. Pain for 2 days. Appendicitis suspected. EXAM: CT ABDOMEN AND PELVIS WITH CONTRAST TECHNIQUE: Multidetector CT imaging of the abdomen and pelvis was performed using the standard protocol following bolus administration of intravenous contrast. CONTRAST:  167mL OMNIPAQUE IOHEXOL 300 MG/ML  SOLN COMPARISON:  CT of the abdomen and pelvis 05/04/2017 FINDINGS: Lower chest: Scarring along the right minor fissure is stable. No other focal nodule, mass, or airspace disease is present. Heart size is normal. No significant pleural or pericardial effusion is present. Hepatobiliary: A subcentimeter cyst in the left lobe of the liver is stable. No other focal  lesions are present. The common bile duct and gallbladder normal. Pancreas: Unremarkable. No pancreatic ductal dilatation or surrounding inflammatory changes. Spleen: Normal in size without focal abnormality. Adrenals/Urinary Tract: The adrenal glands are normal. Kidneys and ureters are within normal limits. The urinary bladder is normal. Stomach/Bowel: The stomach and duodenum are within normal limits. The small bowel is within normal limits. Terminal ileum is unremarkable. The appendix is enlarged and inflamed. It measures up to 11 mm in thickness. No appendicolith is visualized. There is no free fluid or free air. The ascending and transverse colon are within normal limits. Descending colon is normal. Diverticular changes are present in the sigmoid colon. No focal inflammatory changes are present. Vascular/Lymphatic: No significant atherosclerotic changes are present. There is no aneurysm. No significant adenopathy is present. Reproductive: Uterus and bilateral adnexa are unremarkable. Other: No abdominal wall hernia or abnormality. No abdominopelvic ascites. Musculoskeletal: Endplate degenerative changes are present at L4-5 at L5-S1. Foraminal narrowing is greatest at L4-5. The bony pelvis is within normal limits. Hips are located and normal. IMPRESSION: 1. Thickening and inflammatory changes of the appendix compatible with acute appendicitis. There is no evidence for rupture or abscess. 2. Sigmoid diverticulosis without diverticulitis. 3. Stable scarring along the right minor fissure. 4. Stable subcentimeter cyst in the left lobe of the liver. Electronically Signed   By: San Morelle M.D.   On: 11/28/2018 14:37    Procedures Procedures (including critical care time)  Medications Ordered in ED Medications  cefTRIAXone (  ROCEPHIN) 2 g in sodium chloride 0.9 % 100 mL IVPB (2 g Intravenous New Bag/Given 11/28/18 1458)    And  metroNIDAZOLE (FLAGYL) IVPB 500 mg (has no administration in time range)    0.9 %  sodium chloride infusion (has no administration in time range)  cefTRIAXone (ROCEPHIN) 2 g in sodium chloride 0.9 % 100 mL IVPB (has no administration in time range)    And  metroNIDAZOLE (FLAGYL) IVPB 500 mg (has no administration in time range)  acetaminophen (TYLENOL) tablet 1,000 mg (has no administration in time range)  ketorolac (TORADOL) 30 MG/ML injection 30 mg (has no administration in time range)  traMADol (ULTRAM) tablet 50 mg (has no administration in time range)  HYDROmorphone (DILAUDID) injection 0.5 mg (has no administration in time range)  methocarbamol (ROBAXIN) tablet 500 mg (has no administration in time range)  diphenhydrAMINE (BENADRYL) capsule 25 mg (has no administration in time range)    Or  diphenhydrAMINE (BENADRYL) injection 25 mg (has no administration in time range)  docusate sodium (COLACE) capsule 100 mg (has no administration in time range)  bisacodyl (DULCOLAX) suppository 10 mg (has no administration in time range)  ondansetron (ZOFRAN-ODT) disintegrating tablet 4 mg (has no administration in time range)    Or  ondansetron (ZOFRAN) injection 4 mg (has no administration in time range)  metoprolol tartrate (LOPRESSOR) injection 5 mg (has no administration in time range)  hydrALAZINE (APRESOLINE) injection 10 mg (has no administration in time range)  morphine 4 MG/ML injection 4 mg (4 mg Intravenous Given 11/28/18 1309)  ondansetron (ZOFRAN) injection 4 mg (4 mg Intravenous Given 11/28/18 1309)  sodium chloride 0.9 % bolus 1,000 mL (0 mLs Intravenous Stopped 11/28/18 1357)  iohexol (OMNIPAQUE) 300 MG/ML solution 100 mL (100 mLs Intravenous Contrast Given 11/28/18 1422)     Initial Impression / Assessment and Plan / ED Course  I have reviewed the triage vital signs and the nursing notes.  Pertinent labs & imaging results that were available during my care of the patient were reviewed by me and considered in my medical decision making (see chart for  details).     57 yo F with a chief complaint of colicky abdominal pain and diarrhea.  Pain now is worse on the right side than the left.  She was sent from urgent care for concern for appendicitis.  Will obtain labs and CT.  No noted leukocytosis renal function appears to be at baseline no concerning electrolyte abnormality.  CT scan does show acute appendicitis.  Will discuss with general surgery.  Started on antibiotics per the order set.  The patients results and plan were reviewed and discussed.   Any x-rays performed were independently reviewed by myself.   Differential diagnosis were considered with the presenting HPI.  Medications  cefTRIAXone (ROCEPHIN) 2 g in sodium chloride 0.9 % 100 mL IVPB (2 g Intravenous New Bag/Given 11/28/18 1458)    And  metroNIDAZOLE (FLAGYL) IVPB 500 mg (has no administration in time range)  0.9 %  sodium chloride infusion (has no administration in time range)  cefTRIAXone (ROCEPHIN) 2 g in sodium chloride 0.9 % 100 mL IVPB (has no administration in time range)    And  metroNIDAZOLE (FLAGYL) IVPB 500 mg (has no administration in time range)  acetaminophen (TYLENOL) tablet 1,000 mg (has no administration in time range)  ketorolac (TORADOL) 30 MG/ML injection 30 mg (has no administration in time range)  traMADol (ULTRAM) tablet 50 mg (has no administration in time range)  HYDROmorphone (DILAUDID) injection 0.5 mg (has no administration in time range)  methocarbamol (ROBAXIN) tablet 500 mg (has no administration in time range)  diphenhydrAMINE (BENADRYL) capsule 25 mg (has no administration in time range)    Or  diphenhydrAMINE (BENADRYL) injection 25 mg (has no administration in time range)  docusate sodium (COLACE) capsule 100 mg (has no administration in time range)  bisacodyl (DULCOLAX) suppository 10 mg (has no administration in time range)  ondansetron (ZOFRAN-ODT) disintegrating tablet 4 mg (has no administration in time range)    Or    ondansetron (ZOFRAN) injection 4 mg (has no administration in time range)  metoprolol tartrate (LOPRESSOR) injection 5 mg (has no administration in time range)  hydrALAZINE (APRESOLINE) injection 10 mg (has no administration in time range)  morphine 4 MG/ML injection 4 mg (4 mg Intravenous Given 11/28/18 1309)  ondansetron (ZOFRAN) injection 4 mg (4 mg Intravenous Given 11/28/18 1309)  sodium chloride 0.9 % bolus 1,000 mL (0 mLs Intravenous Stopped 11/28/18 1357)  iohexol (OMNIPAQUE) 300 MG/ML solution 100 mL (100 mLs Intravenous Contrast Given 11/28/18 1422)    Vitals:   11/28/18 1213 11/28/18 1357  BP:  128/79  Pulse:  96  Resp:  18  Temp:  98.6 F (37 C)  TempSrc:  Oral  SpO2:  98%  Weight: 104.3 kg   Height: 5\' 4"  (1.626 m)     Final diagnoses:  Acute appendicitis, uncomplicated    Admission/ observation were discussed with the admitting physician, patient and/or family and they are comfortable with the plan.    Final Clinical Impressions(s) / ED Diagnoses   Final diagnoses:  Acute appendicitis, uncomplicated    ED Discharge Orders    None       Deno Etienne, DO 11/28/18 1521

## 2018-11-28 NOTE — ED Provider Notes (Signed)
[address]    CSN: [id number] Arrival date & time: 11/28/18  0865     History   Chief Complaint Chief Complaint  Patient presents with  . Abdominal Pain    HPI April [name] is a 57 y.o. female.   HPI  Patient is here for abdominal pain.  She states is been worsening over last 2 days.  This started off to be in her upper stomach and periumbilical.  It is settled in her right lower quadrant.  This is worsening over time and making her feel some more nauseated.  She thought she might be constipated.  She took MiraLAX, Pepto-Bismol, and then for laxative pills and had some diarrhea.  She is pretty sure her colon is empty, but the pain persists.  No fever chills.  No vomiting.  No history of abdominal surgeries.  Past Medical History:  Diagnosis Date  . Allergy 07/28/2012   Seasonal/Environmental allergies  . Anxiety 2013   Since 2013  . Arthritis 2014 per patient    knees and shoulders  . Bilateral ankle fractures 07/2015   Booted and resolved   . Cancer [facility]) dx June 22, 2016   right breast  . Depression 2013   Multiple  episodes  in past.  . Elevated cholesterol 2017  . Fibromyalgia 2013   diagnosed by Dr. [name]  . Genital herpes 2005   Has outbreaks monthly if not on preventative medication  . GERD (gastroesophageal reflux disease) 2013  . History of radiation therapy 02/07/17- 03/21/17   Right Breast- 4 field 25 fractions. 50 Gy to SCLV/PAB in 25 fractions. Right Breast Boost 10 gy in 5 fractions.  . Migraine 2013   migraines  . Neuromuscular disorder ([place]) 03/20/2017   neuropathy in fingers and toes from Chemo--intermittent  . Obesity   . Osteoporosis 03/23/2017   noted per bone density scan  . Peripheral neuropathy 08/13/2017  . Personal history of chemotherapy 11/2016  . Personal history of radiation therapy    4/18  . Right wrist fracture 06/2015   Resolved  . Scoliosis of thoracic spine 01/04/2012  . Skin condition 2012   patient reports  periodic episodes of severe itching. She will itch and then blister at areas including her arms, back, and buttocks.   . Urinary, incontinence, stress female 07/14/2016   patient reported    Patient Active Problem List   Diagnosis Date Noted  . De Quervain's disease (tenosynovitis) 05/15/2018  . Bilateral carpal tunnel syndrome 05/15/2018  . 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 Peripheral neuropathy 08/13/2017  . Osteoporosis 03/23/2017  . Breast cancer metastasized to axillary lymph node ([place]) 06/21/2016  . Malignant neoplasm of upper-outer quadrant of right breast in female, estrogen receptor positive ([place]) 06/21/2016  . Elevated blood pressure reading without diagnosis of hypertension 04/28/2016  . Herpes genitalis--since 2005 per patient   . Environmental and seasonal allergies 07/28/2012  . Fibromyalgia   . Anxiety 02/07/2012  . Depression (emotion) 01/04/2012  . Headache-migranes 01/04/2012    Past Surgical History:  Procedure Laterality Date  . AXILLARY LYMPH NODE DISSECTION Right 12/26/2016   Procedure: RIGHT AXILLARY LYMPH NODE DISSECTION;  Surgeon: Excell Seltzer, MD;  Location: [place];  Service: General;  Laterality: Right;  . BREAST LUMPECTOMY Right 2018  . BREAST LUMPECTOMY WITH NEEDLE LOCALIZATION Right 12/19/2016   Procedure: RIGHT BREAST NEEDLE LOCALIZED LUMPECTOMY, RIGHT RADIOACTIVE SEED TARGETED AXILLARY SENTINEL LYMPH NODE BIOPSY;  Surgeon: Excell Seltzer, MD;  Location:  [place];  Service: General;  Laterality: Right;  . IR GENERIC HISTORICAL  10/09/2016   IR CV LINE INJECTION 10/09/2016 Aletta Edouard, MD [facility]-INTER[facility] RAD  . PORT-A-CATH REMOVAL Left 12/19/2016   Procedure: REMOVAL PORT-A-CATH;  Surgeon: Excell Seltzer, MD;  Location: [place];  Service: General;  Laterality: Left;  . PORTACATH PLACEMENT N/A 07/11/2016   Procedure: INSERTION PORT-A-CATH;  Surgeon: Excell Seltzer, MD;   Location: [facility];  Service: General;  Laterality: N/A;  . RADIOACTIVE SEED GUIDED AXILLARY SENTINEL LYMPH NODE Right 12/19/2016   Procedure: RADIOACTIVE SEED GUIDED AXILLARY SENTINEL LYMPH NODE BIOPSY;  Surgeon: Excell Seltzer, MD;  Location: [place];  Service: General;  Laterality: Right;  . WISDOM TOOTH EXTRACTION  yrs ago    OB History    Gravida  3   Para  1   Term  1   Preterm      AB  2   Living  1     SAB      TAB  2   Ectopic      Multiple      Live Births               Home Medications    Prior to Admission medications   Medication Sig Start Date End Date Taking? Authorizing Provider  acetaminophen (TYLENOL 8 HOUR) 650 MG CR tablet Take 650 mg by mouth 2 (two) times daily as needed for pain. 02/28/18   [provider]  butalbital-acetaminophen-caffeine (FIORICET, ESGIC) 50-325-40 MG tablet TAKE 2 TABLETS BY MOUTH EVERY 6 HOURS AS NEEDED FOR HEADACHE/MIGRAINE 10/23/17   Magrinat, Virgie Dad, MD  Calcium Carb-Cholecalciferol (CALCIUM/VITAMIN D) 600-400 MG-UNIT TABS Take 2 tablets by mouth daily. Reports starting January 2018    [provider]  cetirizine (ZYRTEC) 10 MG tablet Take 10 mg by mouth daily.    [provider]  cyclobenzaprine (FLEXERIL) 10 MG tablet Take 1 tablet (10 mg total) by mouth 2 (two) times daily as needed for muscle spasms. 08/14/18   [name], MD  DULoxetine (CYMBALTA) 60 MG capsule TAKE 1 CAPSULE BY MOUTH ONCE DAILY 11/20/18   Magrinat, Virgie Dad, MD  ECHINACEA PO Take 2 capsules by mouth 2 (two) times daily as needed (Only takes when she is getting a cold).  07/02/17   [provider]  exemestane (AROMASIN) 25 MG tablet Take 1 tablet (25 mg total) by mouth daily after breakfast. 02/11/18   Magrinat, Virgie Dad, MD  fluticasone (FLONASE) 50 MCG/ACT nasal spray Place 2 sprays into both nostrils daily. 05/15/18   [name], MD  ibuprofen (ADVIL,MOTRIN) 800 MG tablet TAKE 1  TABLET BY MOUTH EVERY 8 HOURS AS NEEDED. 10/16/16   Magrinat, Virgie Dad, MD  Phenylephrine-DM-GG (MUCINEX FAST-MAX CONGEST COUGH) 2.5-5-100 MG/5ML LIQD Take 5 mLs by mouth as needed (cough). 09/04/18   [provider]  Pseudoephedrine-Acetaminophen (ALKA-SELTZER PLUS COLD/SINUS PO) Take 1 Dose by mouth as needed. 09/04/18   [provider]  ranitidine (ZANTAC) 150 MG capsule Take 150 mg by mouth daily.     [provider]  tetrahydrozoline-zinc (VISINE-AC) 0.05-0.25 % ophthalmic solution Place 2 drops into both eyes daily. 01/16/18   [provider]  valACYclovir (VALTREX) 1000 MG tablet 1/2 tab by mouth twice daily 08/14/18   [name], MD    Family History Family History  Problem Relation Age of Onset  . Arthritis Mother   . Hypertension Mother   . Heart disease Mother   .  Dementia Mother   . Irritable bowel syndrome Mother   . Emphysema Father   . Cancer Father        bladder  . Cerebral aneurysm Father        ruptured aneurysm was cause of death  . Bipolar disorder Daughter        Not clear if this is the case.  Possibly Bipolar II  . Depression Daughter   . Graves' disease Sister   . Vitiligo Sister   . Mental illness Brother        Depression  . Mental illness Sister        likely undiagnosed schizophrenia  . Mental illness Brother        Schizophrenia    Social History Social History   Tobacco Use  . Smoking status: Current Some Day Smoker    Packs/day: 0.25    Years: 15.00    Pack years: 3.75    Types: Cigarettes    Last attempt to quit: 01/21/1994    Years since quitting: 24.8  . Smokeless tobacco: Never Used  Substance Use Topics  . Alcohol use: Yes    Alcohol/week: 2.0 - 4.0 standard drinks    Types: 2 - 4 Standard drinks or equivalent per week    Comment: rarely  . Drug use: Yes    Types: Marijuana    Comment: last smoked 6 months ago     Allergies   Hydrocodone; Ultram [tramadol hcl]; and  Gabapentin   Review of Systems Review of Systems  Constitutional: Negative for chills and fever.  HENT: Negative for ear pain and sore throat.   Eyes: Negative for pain and visual disturbance.  Respiratory: Negative for cough and shortness of breath.   Cardiovascular: Negative for chest pain and palpitations.  Gastrointestinal: Positive for abdominal distention, abdominal pain and nausea. Negative for vomiting.  Genitourinary: Negative for dysuria and hematuria.  Musculoskeletal: Negative for arthralgias and back pain.  Skin: Negative for color change and rash.  Neurological: Negative for seizures and syncope.  All other systems reviewed and are negative.    Physical Exam Triage Vital Signs ED Triage Vitals [11/28/18 1120]  Enc Vitals Group     BP (!) 120/92     Pulse Rate 96     Resp 18     Temp 98.4 F (36.9 C)     Temp Source Oral     SpO2 98 %     Weight      Height      Head Circumference      Peak Flow      Pain Score 7     Pain Loc      Pain Edu?      Excl. in [place]?    No data found.  Updated Vital Signs BP (!) 120/92 (BP Location: Left Arm)   Pulse 96   Temp 98.4 F (36.9 C) (Oral)   Resp 18   SpO2 98%      Physical Exam Constitutional:      General: She is not in acute distress.    Appearance: She is well-developed.  HENT:     Head: Normocephalic and atraumatic.  Eyes:     Conjunctiva/sclera: Conjunctivae normal.     Pupils: Pupils are equal, round, and reactive to light.  Neck:     Musculoskeletal: Normal range of motion.  Cardiovascular:     Rate and Rhythm: Normal rate.  Pulmonary:     Effort: Pulmonary effort is normal.  No respiratory distress.  Abdominal:     General: There is no distension.     Palpations: Abdomen is soft.     Tenderness: There is abdominal tenderness in the right lower quadrant. There is guarding and rebound.  Musculoskeletal: Normal range of motion.  Skin:    General: Skin is warm and dry.  Neurological:      Mental Status: She is alert.      UC Treatments / Results  Labs (all labs ordered are listed, but only abnormal results are displayed) Labs Reviewed - No data to display  EKG None  Radiology No results found.  Procedures Procedures (including critical care time)  Medications Ordered in UC Medications - No data to display  Initial Impression / Assessment and Plan / UC Course  I have reviewed the triage vital signs and the nursing notes.  Pertinent labs & imaging results that were available during my care of the patient were reviewed by me and considered in my medical decision making (see chart for details).    I explained to the patient with point tenderness in the right lower quadrant she needed additional work-up that we could provide at the urgent care center.  She has been referred to the emergency department Final Clinical Impressions(s) / UC Diagnoses   Final diagnoses:  Right lower quadrant abdominal pain     Discharge Instructions     It is possible you have appendicitis or diverticultits You need to go to the ER   ED Prescriptions    None     Controlled Substance Prescriptions [place] Controlled Substance Registry consulted? Not Applicable   Raylene Everts, MD 11/28/18 [phone]

## 2018-11-28 NOTE — Anesthesia Postprocedure Evaluation (Signed)
Anesthesia Post Note  Patient: April [name]  Procedure(s) Performed: APPENDECTOMY LAPAROSCOPIC (N/A Abdomen)     Patient location during evaluation: PACU Anesthesia Type: General Level of consciousness: awake and alert Pain management: pain level controlled Vital Signs Assessment: post-procedure vital signs reviewed and stable Respiratory status: spontaneous breathing, nonlabored ventilation, respiratory function stable and patient connected to nasal cannula oxygen Cardiovascular status: blood pressure returned to baseline and stable Postop Assessment: no apparent nausea or vomiting Anesthetic complications: no    Last Vitals:  Vitals:   11/28/18 2030 11/28/18 2045  BP: 112/75 116/78  Pulse: 77 79  Resp: 17 16  Temp:  36.6 C  SpO2: 92% 98%    Last Pain:  Vitals:   11/28/18 2045  TempSrc:   PainSc: Asleep                 Mirel Hundal S

## 2018-11-28 NOTE — ED Triage Notes (Signed)
Pt c/o sudden right sided abdominal pain.

## 2018-11-28 NOTE — ED Notes (Signed)
Got patient on the got patient a warm blanket patient is resting with call bell in reach

## 2018-11-28 NOTE — Anesthesia Preprocedure Evaluation (Addendum)
Anesthesia Evaluation  Patient identified by MRN, date of birth, ID band Patient awake    Reviewed: Allergy & Precautions, NPO status , Patient's Chart, lab work & pertinent test results  History of Anesthesia Complications Negative for: history of anesthetic complications  Airway Mallampati: I  TM Distance: >3 FB Neck ROM: Full    Dental  (+) Dental Advisory Given   Pulmonary Current Smoker,    breath sounds clear to auscultation       Cardiovascular (-) anginanegative cardio ROS   Rhythm:Regular Rate:Normal  '17 ECHO: EF 65-70%, valves OK   Neuro/Psych  Headaches, Anxiety Depression    GI/Hepatic Neg liver ROS, GERD  Controlled,  Endo/Other  Morbid obesity  Renal/GU negative Renal ROS     Musculoskeletal  (+) Arthritis , Osteoarthritis,  Fibromyalgia -, narcotic dependent  Abdominal (+) + obese,   Peds  Hematology negative hematology ROS (+)   Anesthesia Other Findings H/o breast cancer surgery, chemo, XRT  Reproductive/Obstetrics Post menopausal                           Anesthesia Physical Anesthesia Plan  ASA: II  Anesthesia Plan: General   Post-op Pain Management:    Induction: Intravenous  PONV Risk Score and Plan: 3 and Ondansetron, Dexamethasone and Treatment may vary due to age or medical condition  Airway Management Planned: Oral ETT  Additional Equipment:   Intra-op Plan:   Post-operative Plan: Extubation in OR  Informed Consent: I have reviewed the patients History and Physical, chart, labs and discussed the procedure including the risks, benefits and alternatives for the proposed anesthesia with the patient or authorized representative who has indicated his/her understanding and acceptance.   Dental advisory given  Plan Discussed with: CRNA and Surgeon  Anesthesia Plan Comments: (Plan routine monitors, GETA)       Anesthesia Quick Evaluation

## 2018-11-28 NOTE — Discharge Instructions (Addendum)
It is possible you have appendicitis or diverticultits You need to go to the ER

## 2018-11-28 NOTE — Transfer of Care (Signed)
Immediate Anesthesia Transfer of Care Note  Patient: April [name]  Procedure(s) Performed: APPENDECTOMY LAPAROSCOPIC (N/A Abdomen)  Patient Location: PACU  Anesthesia Type:General  Level of Consciousness: awake, alert , oriented and patient cooperative  Airway & Oxygen Therapy: Patient Spontanous Breathing and Patient connected to face mask oxygen  Post-op Assessment: Report given to RN, Post -op Vital signs reviewed and stable and Patient moving all extremities  Post vital signs: Reviewed and stable  Last Vitals:  Vitals Value Taken Time  BP    Temp    Pulse    Resp    SpO2      Last Pain:  Vitals:   11/28/18 1357  TempSrc: Oral  PainSc:          Complications: No apparent anesthesia complications

## 2018-11-28 NOTE — Op Note (Signed)
Operative Report  April [name] 56 y.o. female  [id number]  [id number]  11/28/2018  Surgeon: [name]   Assistant: none  Procedure performed: Laparoscopic Appendectomy  Preop diagnosis: Acute appendicitis  Post-op diagnosis/intraop findings: Acute appendicitis  Specimens: appendix  EBL: minimal  Complications: none  Description of procedure: After obtaining informed consent the patient was brought to the operating room. Antibiotics were administered. SCD's were applied. General endotracheal anesthesia was initiated and a formal time-out was performed. Foley catheter inserted which is removed at the end of the case. The abdomen was prepped and draped in the usual sterile fashion and the abdomen was entered using an infraumbilical Veress needle and insufflated to 15 mmHg. A 5 mm trocar and camera were then introduced, the abdomen was inspected and there is no evidence of injury from our entry. A suprapubic 5 mm trocar and a left lower quadrant 12 mm trocar were introduced under direct visualization following infiltration with local. The patient was then placed in Trendelenburg and rotated to the left and the small bowel was reflected cephalad. The appendix was visualized: it appears acutely inflamed, no free fluid or purulence was present.  The mesoappendix was divided with the Harmonic scalpel down to the base of the appendix and a blue load linear cutting stapler was used to transect the appendix from the cecum, taking a small cuff of healthy cecum with the specimen. Hemostasis was ensured. The appendix was placed in an Endo Catch bag and removed through our 12 mm trocar site. The right lower quadrant was then inspected and irrigated gently. The effluent was clear. Both the mesentery and the staple line appeared hemostatic, the staple line appeared viable and intact. Omentum was brought back down to rest along the staple line. The 51mm trocar site in the left lower quadrant was  closed with a 0 vicryl in the fascia under direct visualization using a PMI device. The abdomen was desufflated and all trocars removed. The skin incisions were closed with running subcuticular monocryl and Dermabond. The patient was awakened, extubated and transported to the recovery room in stable condition.   All counts were correct at the completion of the case.

## 2018-11-28 NOTE — ED Triage Notes (Signed)
Pt presents to [facility] for assessment of abdominal pain x 2 days, with a heaviness feeling in her abdomen.  Pt c/o it starting with acid reflux, then centralized abdominal pain, and then LRQ pain.  States last BM 3 days ago.  States hx of similar symptoms with constipation.  Pt c/o rectal irritation every time she has a bowel movement, and c/o noting feces when wiping after urinating later in the day.  Pt states she took laxatives with a BM this morning and denies relief in pain.

## 2018-11-28 NOTE — Anesthesia Procedure Notes (Signed)
Procedure Name: Intubation Date/Time: 11/28/2018 7:05 PM Performed by: Myna Bright, CRNA Pre-anesthesia Checklist: Patient identified, Emergency Drugs available, Suction available and Patient being monitored Patient Re-evaluated:Patient Re-evaluated prior to induction Oxygen Delivery Method: Circle system utilized Preoxygenation: Pre-oxygenation with 100% oxygen Induction Type: IV induction Ventilation: Mask ventilation without difficulty Laryngoscope Size: Mac and 3 Grade View: Grade I Tube type: Oral Tube size: 7.0 mm Number of attempts: 1 Airway Equipment and Method: Stylet Placement Confirmation: ETT inserted through vocal cords under direct vision,  breath sounds checked- equal and bilateral and positive ETCO2 Secured at: 21 cm Tube secured with: Tape Dental Injury: Teeth and Oropharynx as per pre-operative assessment

## 2018-11-28 NOTE — ED Notes (Signed)
Wallet, phone, 1 ring, and a set of keys stored in security at this time. Pt aware of plan.

## 2018-11-29 ENCOUNTER — Encounter: Payer: Self-pay | Admitting: *Deleted

## 2018-11-29 ENCOUNTER — Encounter (HOSPITAL_COMMUNITY): Payer: Self-pay | Admitting: Surgery

## 2018-11-29 LAB — CBC
HCT: 40.3 % (ref 36.0–46.0)
Hemoglobin: 13.1 g/dL (ref 12.0–15.0)
MCH: 32.3 pg (ref 26.0–34.0)
MCHC: 32.5 g/dL (ref 30.0–36.0)
MCV: 99.3 fL (ref 80.0–100.0)
Platelets: 237 10*3/uL (ref 150–400)
RBC: 4.06 MIL/uL (ref 3.87–5.11)
RDW: 12.9 % (ref 11.5–15.5)
WBC: 5.4 10*3/uL (ref 4.0–10.5)
nRBC: 0 % (ref 0.0–0.2)

## 2018-11-29 LAB — BASIC METABOLIC PANEL
Anion gap: 9 (ref 5–15)
BUN: 7 mg/dL (ref 6–20)
CO2: 24 mmol/L (ref 22–32)
Calcium: 8.7 mg/dL — ABNORMAL LOW (ref 8.9–10.3)
Chloride: 106 mmol/L (ref 98–111)
Creatinine, Ser: 0.8 mg/dL (ref 0.44–1.00)
GFR calc Af Amer: >60 mL/min (ref >60)
GFR calc non Af Amer: >60 mL/min (ref >60)
GLUCOSE: 145 mg/dL — AB (ref 70–99)
Potassium: 4.4 mmol/L (ref 3.5–5.1)
Sodium: 139 mmol/L (ref 135–145)

## 2018-11-29 LAB — HIV ANTIBODY (ROUTINE TESTING W REFLEX): HIV Screen 4th Generation wRfx: NONREACTIVE

## 2018-11-29 MED ORDER — ACETAMINOPHEN 500 MG PO TABS: 1000.0000 mg | ORAL_TABLET | Freq: Four times a day (QID) | ORAL | 0 refills | Status: DC | PRN

## 2018-11-29 MED ORDER — TRAMADOL HCL 50 MG PO TABS: 50.0000 mg | ORAL_TABLET | Freq: Four times a day (QID) | ORAL | 0 refills | Status: DC | PRN

## 2018-11-29 MED ORDER — ONDANSETRON 4 MG PO TBDP: 4.0000 mg | ORAL_TABLET | Freq: Four times a day (QID) | ORAL | 0 refills | Status: DC | PRN

## 2018-11-29 NOTE — Progress Notes (Signed)
Patient discharged to home. Verbalizes understanding of all discharge instructions including incision care, discharge medications, and follow up MD visits. Patient accompanied by daughter.

## 2018-11-29 NOTE — Progress Notes (Addendum)
SAE Reporting for [name] AFT-05-  Actue appendicitis and subsequent appendectomy reported to the study as Serious AE due to inpatient hospitalization.  Per Dr. Lindi Adie who is covering for Dr. Jana Hakim, this is not related to patient's Endocrine therapy, Exemestane.  April [name], BSN, RN Clinical Research Nurse 11/29/2018 9:58 AM

## 2018-11-29 NOTE — Discharge Instructions (Signed)
[facility] ° °Please arrive at least 30 min before your appointment to complete your check in paperwork.  If you are unable to arrive 30 min prior to your appointment time we may have to cancel or reschedule you. °LAPAROSCOPIC SURGERY: POST OP INSTRUCTIONS °Always review your discharge instruction sheet given to you by the facility where your surgery was performed. °IF YOU HAVE DISABILITY OR FAMILY LEAVE FORMS, YOU MUST BRING THEM TO THE OFFICE FOR PROCESSING.   °DO NOT GIVE THEM TO YOUR DOCTOR. ° °PAIN CONTROL ° °1. First take acetaminophen (Tylenol) AND/or ibuprofen (Advil) to control your pain after surgery.  Follow directions on package.  Taking acetaminophen (Tylenol) and/or ibuprofen (Advil) regularly after surgery will help to control your pain and lower the amount of prescription pain medication you may need.  You should not take more than 4,000 mg (4 grams) of acetaminophen (Tylenol) in 24 hours.  You should not take ibuprofen (Advil), aleve, motrin, naprosyn or other NSAIDS if you have a history of stomach ulcers or chronic kidney disease.  °2. A prescription for pain medication may be given to you upon discharge.  Take your pain medication as prescribed, if you still have uncontrolled pain after taking acetaminophen (Tylenol) or ibuprofen (Advil). °3. Use ice packs to help control pain. °4. If you need a refill on your pain medication, please contact your pharmacy.  They will contact our office to request authorization. Prescriptions will not be filled after 5pm or on week-ends. ° °HOME MEDICATIONS °5. Take your usually prescribed medications unless otherwise directed. ° °DIET °6. You should follow a light diet the first few days after arrival home.  Be sure to include lots of fluids daily. Avoid fatty, fried foods.  ° °CONSTIPATION °7. It is common to experience some constipation after surgery and if you are taking pain medication.  Increasing fluid intake and taking a stool  softener (such as Colace) will usually help or prevent this problem from occurring.  A mild laxative (Milk of Magnesia or Miralax) should be taken according to package instructions if there are no bowel movements after 48 hours. ° °WOUND/INCISION CARE °8. Most patients will experience some swelling and bruising in the area of the incisions.  Ice packs will help.  Swelling and bruising can take several days to resolve.  °9. Unless discharge instructions indicate otherwise, follow guidelines below  °a. STERI-STRIPS - you may remove your outer bandages 48 hours after surgery, and you may shower at that time.  You have steri-strips (small skin tapes) in place directly over the incision.  These strips should be left on the skin for 7-10 days.   °b. DERMABOND/SKIN GLUE - you may shower in 24 hours.  The glue will flake off over the next 2-3 weeks. °10. Any sutures or staples will be removed at the office during your follow-up visit. ° °ACTIVITIES °11. You may resume regular (light) daily activities beginning the next day--such as daily self-care, walking, climbing stairs--gradually increasing activities as tolerated.  You may have sexual intercourse when it is comfortable.  Refrain from any heavy lifting or straining until approved by your doctor. °a. You may drive when you are no longer taking prescription pain medication, you can comfortably wear a seatbelt, and you can safely maneuver your car and apply brakes. ° °FOLLOW-UP °12. You should see your doctor in the office for a follow-up appointment approximately 2-3 weeks after your surgery.  You should have been given your post-op/follow-up appointment when   your surgery was scheduled.  If you did not receive a post-op/follow-up appointment, make sure that you call for this appointment within a day or two after you arrive home to insure a convenient appointment time. ° °OTHER INSTRUCTIONS ° °WHEN TO CALL YOUR DOCTOR: °1. Fever over 101.0 °2. Inability to  urinate °3. Continued bleeding from incision. °4. Increased pain, redness, or drainage from the incision. °5. Increasing abdominal pain ° °The clinic staff is available to answer your questions during regular business hours.  Please don’t hesitate to call and ask to speak to one of the nurses for clinical concerns.  If you have a medical emergency, go to the nearest emergency room or call 911.  A surgeon from [facility] is always on call at the hospital. °[address], [address], [place], [place]  [address] ? [address] [address], [place], [place]   [address] °[phone] ? [phone] ? FAX [phone] ° ° ° °

## 2018-11-29 NOTE — Discharge Summary (Signed)
Patient ID: April [name] [id number] [birth year] 57 y.o.  Admit date: 11/28/2018 Discharge date: 11/29/2018  Admitting Diagnosis: Acute appendicitis  Discharge Diagnosis Patient Active Problem List   Diagnosis Date Noted  . Appendicitis 11/28/2018  . De Quervain's disease (tenosynovitis) 05/15/2018  . Bilateral carpal tunnel syndrome 05/15/2018  . Neuropathy due to chemotherapeutic drug ([place]) 02/11/2018  . Peripheral neuropathy 08/13/2017  . Osteoporosis 03/23/2017  . Breast cancer metastasized to axillary lymph node ([place]) 06/21/2016  . Malignant neoplasm of upper-outer quadrant of right breast in female, estrogen receptor positive ([place]) 06/21/2016  . Elevated blood pressure reading without diagnosis of hypertension 04/28/2016  . Herpes genitalis--since 2005 per patient   . Environmental and seasonal allergies 07/28/2012  . Fibromyalgia   . Anxiety 02/07/2012  . Depression (emotion) 01/04/2012  . Headache-migranes 01/04/2012    Consultants none  Reason for Admission: April [name] is a 57yo female who presented to the [facility] earlier today with 2 days of worsening abdominal pain. States that she work up on 93/71 with periumbilical abdominal pain. Since that time it has migrated to her RLQ and is associated with nausea and acid reflux. Denies fever, chills, emesis. Pain is worse with palpation and movement. Thought that she was constipation so she took 4 doses of some over the counter laxative; she had a normal BM this morning without relief of her pain. She also tried Peptobismal but this did not help so she decided to come to the ED. ED workup included a CT scan which shows thickening and inflammatory changes of the appendix compatible with acute appendicitis without evidence for rupture or abscess. WBC 4.7, afebrile, VSS.  Patient was started on IV rocephin/flagyl and general surgery asked to see  Procedures Lap appy, Dr. Kae Heller 12/26  Hospital Course:  The patient  was admitted and underwent a laparoscopic appendectomy.  The patient tolerated the procedure well.  On POD 1, the patient was tolerating a regular diet, voiding well, mobilizing, and pain was controlled with oral pain medications.  The patient was stable for DC home at this time with appropriate follow up made.     Physical Exam: Abd: soft, appropriately tender, +BS, ND, incisions c/d/i  Allergies as of 11/29/2018      Reactions   Hydrocodone Nausea Only, Other (See Comments)   dizziness   Ultram [tramadol Hcl] Nausea Only   Gabapentin Rash      Medication List    STOP taking these medications   TYLENOL 8 HOUR 650 MG CR tablet Generic drug:  acetaminophen Replaced by:  acetaminophen 500 MG tablet     TAKE these medications   acetaminophen 500 MG tablet Commonly known as:  TYLENOL Take 2 tablets (1,000 mg total) by mouth every 6 (six) hours as needed. Replaces:  TYLENOL 8 HOUR 650 MG CR tablet   butalbital-acetaminophen-caffeine 50-325-40 MG tablet Commonly known as:  FIORICET, ESGIC TAKE 2 TABLETS BY MOUTH EVERY 6 HOURS AS NEEDED FOR HEADACHE/MIGRAINE What changed:  See the new instructions.   Calcium/Vitamin D 600-400 MG-UNIT Tabs Take 2 tablets by mouth daily. Reports starting January 2018   cetirizine 10 MG tablet Commonly known as:  ZYRTEC Take 10 mg by mouth daily.   DULoxetine 60 MG capsule Commonly known as:  CYMBALTA TAKE 1 CAPSULE BY MOUTH ONCE DAILY   ECHINACEA PO Take 2 capsules by mouth 2 (two) times daily as needed (Only takes when she is getting a cold).   exemestane 25 MG tablet Commonly  known as:  AROMASIN Take 1 tablet (25 mg total) by mouth daily after breakfast.   fluticasone 50 MCG/ACT nasal spray Commonly known as:  FLONASE Place 2 sprays into both nostrils daily. What changed:    when to take this  reasons to take this   ibuprofen 200 MG tablet Commonly known as:  ADVIL,MOTRIN Take 600 mg by mouth as needed for headache or moderate  pain.   MUCINEX FAST-MAX CONGEST COUGH 2.5-5-100 MG/5ML Liqd Generic drug:  Phenylephrine-DM-GG Take 5 mLs by mouth as needed (cough).   ondansetron 4 MG disintegrating tablet Commonly known as:  ZOFRAN-ODT Take 1 tablet (4 mg total) by mouth every 6 (six) hours as needed for nausea.   ranitidine 150 MG capsule Commonly known as:  ZANTAC Take 150 mg by mouth daily.   traMADol 50 MG tablet Commonly known as:  ULTRAM Take 1 tablet (50 mg total) by mouth every 6 (six) hours as needed (mild pain).   valACYclovir 1000 MG tablet Commonly known as:  VALTREX 1/2 tab by mouth twice daily        Follow-up [facility], [place]. Go on 12/12/2018.   Specialty:  General Surgery Why:  Your appointment is 01/09 at 2:45 pm Please arrive 30 minutes prior to your appointment to check in and fill out paperwork. Contact information: [address] [place] [phone]          Signed: Saverio Danker, [facility] 11/29/2018, 10:07 AM Pager: [phone]

## 2018-11-29 NOTE — Plan of Care (Signed)
  Problem: Health Behavior/Discharge Planning: Goal: Ability to manage health-related needs will improve Outcome: Progressing   Problem: Clinical Measurements: Goal: Respiratory complications will improve Outcome: Progressing   Problem: Activity: Goal: Risk for activity intolerance will decrease Outcome: Progressing   Problem: Coping: Goal: Level of anxiety will decrease Outcome: Progressing   

## 2018-12-11 ENCOUNTER — Other Ambulatory Visit: Payer: Self-pay | Admitting: Internal Medicine

## 2018-12-11 DIAGNOSIS — R21 Rash and other nonspecific skin eruption: Secondary | ICD-10-CM

## 2018-12-11 NOTE — Progress Notes (Signed)
Needs referral to skin surgery center for recurrent rash.  I will write a note to accompany.

## 2018-12-12 ENCOUNTER — Telehealth: Payer: Self-pay | Admitting: *Deleted

## 2018-12-12 NOTE — Telephone Encounter (Signed)
Called patient to confirm her upcoming appointment for [place] AFT-05 Cycle 21, Day 1 study visit on Tuesday 12/17/18 at 10 am.  Informed patient another research nurse, Doristine Johns, may cover part of or the whole visit as I have another study visit around the same time.  Asked patient if ok to review her AEs and Con Meds over the phone and she agreed.  AE Review: See AE table below. Patient reports appendicitis and appendectomy as documented in EMR.  She feels she is still recovering from the surgery with some intermittent diarrhea, especially the past 3 days.  Patient states she has had diarrhea several times a day intermittently for the 2 months prior to appendectomy and it is ongoing post-op. She has not taken any medication for this.  Patient sees the surgeon, Dr. Kae Heller, this afternoon and will inform him of the diarrhea. We will request those records.  Patient also reports new onset of mild vaginal dryness for past 2 months.  She denies any other new or worsening AEs since last study visit 09/24/18.  She says her upper respiratory infection cleared up by 10/12/18.  Con Meds: Reviewed with patient and medication list updated.  Thanked patient for her time on the phone today and informed patient research nurse will review AEs and Con Meds again on Tuesday for any changes since today's conversation.  She verbalized understanding.  Cycle: 18-20 Adverse Events (09/25/18-12/12/18)  Event Grade Onset Date Resolved Date Attribution to Exemestane Treatment Comments   Breast Pain (R)-int  1  07/03/17   ongoing  No       None Occasional- Sharp pain-brief  Arthritis-both great toes 1 08/13/17 ongoing   No      None   Radiation Pneumonitis 1 07/27/17 ongoing No      None Per CT scan  Fatigue 1 01/31/18 ongoing Yes       None   Headache   1 04/08/18 ongoing No Tylenol & Ibuprofen prn Intermittent  Joint disorder De Quervain's Tenosynovitis  1 09/24/18 ongoing No Rest, splint per PCP     Peripheral Neuropathy 2 08/22/17 ongoing No Lyrica stopped per patient   Paresthesia in arms: Carpal Tunnel Syndrome 2 05/15/18 ongoing No Spica and cock splint ordered by PCP   Alopecia-mild hair thinning 1 07/01/18 ongoing Yes       None   URI symptoms 1 09/06/18 10/12/18 No OTC Mucinex & Alka Seltzer prn CXR normal  Appendicitis 3 11/26/18 11/28/18  Appendectomy Reported as SAE  Vaginal Dryness 1 09/03/18 ongoing  None   Diarrhea 1 09/28/18 ongoing  None    Foye Spurling, BSN, RN Clinical Research Nurse 12/12/2018 12:02 PM
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
## 2018-12-17 ENCOUNTER — Telehealth: Payer: Self-pay | Admitting: Oncology

## 2018-12-17 ENCOUNTER — Other Ambulatory Visit: Payer: Self-pay | Admitting: Oncology

## 2018-12-17 ENCOUNTER — Inpatient Hospital Stay: Payer: Medicaid Other

## 2018-12-17 ENCOUNTER — Encounter: Payer: Self-pay | Admitting: *Deleted

## 2018-12-17 ENCOUNTER — Inpatient Hospital Stay: Payer: Medicaid Other | Attending: Adult Health | Admitting: Oncology

## 2018-12-17 ENCOUNTER — Ambulatory Visit: Payer: Medicaid Other

## 2018-12-17 VITALS — BP 113/85 | HR 94 | Temp 99.1°F | Resp 18 | Ht 64.0 in | Wt 221.3 lb

## 2018-12-17 DIAGNOSIS — N898 Other specified noninflammatory disorders of vagina: Secondary | ICD-10-CM | POA: Diagnosis not present

## 2018-12-17 DIAGNOSIS — M810 Age-related osteoporosis without current pathological fracture: Secondary | ICD-10-CM

## 2018-12-17 DIAGNOSIS — C773 Secondary and unspecified malignant neoplasm of axilla and upper limb lymph nodes: Secondary | ICD-10-CM | POA: Insufficient documentation

## 2018-12-17 DIAGNOSIS — Z170 Estrogen receptor positive status [ER+]: Secondary | ICD-10-CM | POA: Insufficient documentation

## 2018-12-17 DIAGNOSIS — Z79811 Long term (current) use of aromatase inhibitors: Secondary | ICD-10-CM | POA: Diagnosis not present

## 2018-12-17 DIAGNOSIS — C50411 Malignant neoplasm of upper-outer quadrant of right female breast: Secondary | ICD-10-CM | POA: Diagnosis present

## 2018-12-17 DIAGNOSIS — Z006 Encounter for examination for normal comparison and control in clinical research program: Secondary | ICD-10-CM | POA: Diagnosis not present

## 2018-12-17 DIAGNOSIS — Z9221 Personal history of antineoplastic chemotherapy: Secondary | ICD-10-CM

## 2018-12-17 DIAGNOSIS — Z923 Personal history of irradiation: Secondary | ICD-10-CM | POA: Diagnosis not present

## 2018-12-17 DIAGNOSIS — R197 Diarrhea, unspecified: Secondary | ICD-10-CM | POA: Insufficient documentation

## 2018-12-17 LAB — CBC WITH DIFFERENTIAL (CANCER CENTER ONLY)
Abs Immature Granulocytes: 0.01 10*3/uL (ref 0.00–0.07)
BASOS ABS: 0 10*3/uL (ref 0.0–0.1)
BASOS PCT: 0 %
Eosinophils Absolute: 0 10*3/uL (ref 0.0–0.5)
Eosinophils Relative: 2 %
HCT: 44.8 % (ref 36.0–46.0)
Hemoglobin: 15.2 g/dL — ABNORMAL HIGH (ref 12.0–15.0)
Immature Granulocytes: 0 %
Lymphocytes Relative: 24 %
Lymphs Abs: 0.6 10*3/uL — ABNORMAL LOW (ref 0.7–4.0)
MCH: 32.5 pg (ref 26.0–34.0)
MCHC: 33.9 g/dL (ref 30.0–36.0)
MCV: 95.7 fL (ref 80.0–100.0)
Monocytes Absolute: 0.3 10*3/uL (ref 0.1–1.0)
Monocytes Relative: 10 %
NRBC: 0 % (ref 0.0–0.2)
Neutro Abs: 1.6 10*3/uL — ABNORMAL LOW (ref 1.7–7.7)
Neutrophils Relative %: 64 %
Platelet Count: 194 10*3/uL (ref 150–400)
RBC: 4.68 MIL/uL (ref 3.87–5.11)
RDW: 13 % (ref 11.5–15.5)
WBC Count: 2.5 10*3/uL — ABNORMAL LOW (ref 4.0–10.5)

## 2018-12-17 LAB — CMP (CANCER CENTER ONLY)
ALBUMIN: 3.5 g/dL (ref 3.5–5.0)
ALT: 16 U/L (ref 0–44)
AST: 20 U/L (ref 15–41)
Alkaline Phosphatase: 95 U/L (ref 38–126)
Anion gap: 7 (ref 5–15)
BUN: 7 mg/dL (ref 6–20)
CO2: 26 mmol/L (ref 22–32)
Calcium: 9.2 mg/dL (ref 8.9–10.3)
Chloride: 107 mmol/L (ref 98–111)
Creatinine: 0.87 mg/dL (ref 0.44–1.00)
GFR, Est AFR Am: >60 mL/min (ref >60)
GFR, Estimated: >60 mL/min (ref >60)
GLUCOSE: 98 mg/dL (ref 70–99)
Potassium: 3.8 mmol/L (ref 3.5–5.1)
SODIUM: 140 mmol/L (ref 135–145)
Total Bilirubin: 0.3 mg/dL (ref 0.3–1.2)
Total Protein: 7.2 g/dL (ref 6.5–8.1)

## 2018-12-17 MED ORDER — DENOSUMAB 60 MG/ML ~~LOC~~ SOSY
60.0000 mg | PREFILLED_SYRINGE | Freq: Once | SUBCUTANEOUS | Status: AC
  Administered 2018-12-17: 60 mg via SUBCUTANEOUS

## 2018-12-17 MED ORDER — DENOSUMAB 60 MG/ML ~~LOC~~ SOSY
PREFILLED_SYRINGE | SUBCUTANEOUS | Status: AC
  Filled 2018-12-17: qty 1

## 2018-12-17 NOTE — Telephone Encounter (Signed)
[facility] will contact patient re PT. No other orders per 1/14 schedule message.

## 2018-12-17 NOTE — Telephone Encounter (Signed)
Gave avs and calendar ° °

## 2018-12-17 NOTE — Research (Signed)
[name] AFT-05 Cycle 21, Day 1 Patient into clinic by herself today to complete activities for study visit.  Lab: CBC and CMET drawn per protocol.  History and Physical: Completed by Dr. Jana Hakim. AE Review: See AE table below. Patient reports new upper flu like symptoms including coughing, chills, body aches and headache starting 12/12/18 evening.  Symptoms worsened for several days and are now improving but not completely resolved.  She has been taking OTC cough and cold remedies PRN.  Con Meds: Reviewed with patient and medication list updated. Clarified with patient if she is taking Gas-x as reported in surgeon's follow up note and patient clarified she meant Tums, not Gas-X.  Drug Diaries: Patient returned completed drug diaries for Exemestane cycles 18, 19 and 20.  Gave patient drug diaries for next 3 cycles 21, 22 and 23 to complete.  Thanked patient for her time and ongoing participation in this study.  Informed patient of next study visit due in 12 weeks on 03/11/39.  Encouraged patient to contact research nurse if any questions or concerns prior to next appointment. She verbalized understanding.   Cycle: 18-20Adverse Events (09/25/18-12/17/18)  Event Grade Onset Date Resolved Date Status Attribution to Exemestane Treatment Comments   Breast Pain (R)-int  1  07/03/17    ongoing  No  None Occasional- Sharp pain-brief  Arthritis-both great toes 1 08/13/17   ongoing No None   Radiation Pneumonitis 1 07/27/17  ongoing No None Per CT scan  Fatigue 1 01/31/18  ongoing Yes  None   Headache  1 04/08/18  ongoing No Tylenol& Ibuprofen prn Intermittent  Joint disorder De Quervain's Tenosynovitis  1 09/24/18  ongoing No Rest, splint per PCP    Peripheral Neuropathy 2 08/22/17  ongoing No Lyrica stopped per patient   Paresthesia in arms: Carpal Tunnel Syndrome 2 05/15/18  ongoing No Spica and cock splint ordered by PCP   Alopecia-mild hair thinning 1 07/01/18   ongoing Yes None   URI symptoms 1 09/06/18 10/12/18 resolved No OTC Mucinex & Alka Seltzerprn CXRnormal  Appendicitis 2 11/26/18 11/2618 increased No OTC laxatives Abd pain and nausea  Appendicitis 3 11/28/18 11/29/18 resolved No Appendectomy Reported as SAE  Vaginal Dryness 1 09/03/18  New Yes None   Diarrhea 1 09/28/18  ongoing No None   Flu like symptoms 2 12/12/18  ongoing No OTC cough and flu medication    Foye Spurling, BSN, RN Clinical Research Nurse 12/17/2018

## 2018-12-17 NOTE — Patient Instructions (Signed)

## 2018-12-18 ENCOUNTER — Encounter: Payer: Medicaid Other | Admitting: Internal Medicine

## 2018-12-18 ENCOUNTER — Ambulatory Visit: Payer: Medicaid Other | Attending: Oncology | Admitting: Physical Therapy

## 2018-12-18 ENCOUNTER — Encounter: Payer: Self-pay | Admitting: Physical Therapy

## 2018-12-18 ENCOUNTER — Other Ambulatory Visit: Payer: Self-pay

## 2018-12-18 DIAGNOSIS — M6281 Muscle weakness (generalized): Secondary | ICD-10-CM | POA: Diagnosis not present

## 2018-12-18 DIAGNOSIS — R278 Other lack of coordination: Secondary | ICD-10-CM | POA: Diagnosis present

## 2018-12-18 DIAGNOSIS — M62838 Other muscle spasm: Secondary | ICD-10-CM | POA: Diagnosis present

## 2018-12-18 NOTE — Patient Instructions (Addendum)
Quick Contraction: Gravity Eliminated (Side-Lying)    Lie on left side, hips and knees slightly bent. Quickly squeeze then fully relax pelvic floor. Perform __1_ sets of ___5. Rest for _1__ seconds between sets. Do _3__ times a day.  For rectum and vaginal Copyright  VHI. All rights reserved.    Slow Contraction: Gravity Eliminated (Side-Lying)    Lie on left side, hips and knees slightly bent. Slowly squeeze pelvic floor for _5__ seconds. Rest for _5__ seconds. Repeat __5_ times. Do _3__ times a day.  For both rectum and vagina Copyright  VHI. All rights reserved.  [facility] [address] [place], [place] [address] Phone # [phone] Fax [phone]

## 2018-12-18 NOTE — Therapy (Signed)
[facility] [address] [place], [place], [address] Phone: [phone]   Fax:  [phone]  Physical Therapy Evaluation  Patient Details  Name: April [name] MRN: [id number] Date of Birth: 11-12-1961 Referring Provider (PT): Lurline Del   Encounter Date: 12/18/2018  PT End of Session - 12/18/18 1311    Visit Number  1    Date for PT Re-Evaluation  03/12/19    Authorization Type  medicaid    PT Start Time  1230    PT Stop Time  1305    PT Time Calculation (min)  35 min    Activity Tolerance  Patient tolerated treatment well    Behavior During Therapy  [facility] for tasks assessed/performed       Past Medical History:  Diagnosis Date  . Allergy 07/28/2012   Seasonal/Environmental allergies  . Anxiety 2013   Since 2013  . Arthritis 2014 per patient    knees and shoulders  . Bilateral ankle fractures 07/2015   Booted and resolved   . Cancer [facility]) dx June 22, 2016   right breast  . Depression 2013   Multiple  episodes  in past.  . Elevated cholesterol 2017  . Fibromyalgia 2013   diagnosed by Dr. [name]  . Genital herpes 2005   Has outbreaks monthly if not on preventative medication  . GERD (gastroesophageal reflux disease) 2013  . History of radiation therapy 02/07/17- 03/21/17   Right Breast- 4 field 25 fractions. 50 Gy to SCLV/PAB in 25 fractions. Right Breast Boost 10 gy in 5 fractions.  . Migraine 2013   migraines  . Neuromuscular disorder ([place]) 03/20/2017   neuropathy in fingers and toes from Chemo--intermittent  . Obesity   . Osteoporosis 03/23/2017   noted per bone density scan  . Peripheral neuropathy 08/13/2017  . Personal history of chemotherapy 11/2016  . Personal history of radiation therapy    4/18  . Right wrist fracture 06/2015   Resolved  . Scoliosis of thoracic spine 01/04/2012  . Skin condition 2012   patient reports periodic episodes of severe itching. She will itch and then  blister at areas including her arms, back, and buttocks.   . Urinary, incontinence, stress female 07/14/2016   patient reported    Past Surgical History:  Procedure Laterality Date  . AXILLARY LYMPH NODE DISSECTION Right 12/26/2016   Procedure: RIGHT AXILLARY LYMPH NODE DISSECTION;  Surgeon: Excell Seltzer, MD;  Location: [place];  Service: General;  Laterality: Right;  . BREAST LUMPECTOMY Right 2018  . BREAST LUMPECTOMY WITH NEEDLE LOCALIZATION Right 12/19/2016   Procedure: RIGHT BREAST NEEDLE LOCALIZED LUMPECTOMY, RIGHT RADIOACTIVE SEED TARGETED AXILLARY SENTINEL LYMPH NODE BIOPSY;  Surgeon: Excell Seltzer, MD;  Location: [place];  Service: General;  Laterality: Right;  . IR GENERIC HISTORICAL  10/09/2016   IR CV LINE INJECTION 10/09/2016 Aletta Edouard, MD [facility]  . LAPAROSCOPIC APPENDECTOMY N/A 11/28/2018   Procedure: APPENDECTOMY LAPAROSCOPIC;  Surgeon: [name], MD;  Location: [place];  Service: General;  Laterality: N/A;  . PORT-A-CATH REMOVAL Left 12/19/2016   Procedure: REMOVAL PORT-A-CATH;  Surgeon: Excell Seltzer, MD;  Location: [place];  Service: General;  Laterality: Left;  . PORTACATH PLACEMENT N/A 07/11/2016   Procedure: INSERTION PORT-A-CATH;  Surgeon: Excell Seltzer, MD;  Location: [facility];  Service: General;  Laterality: N/A;  . RADIOACTIVE SEED GUIDED AXILLARY SENTINEL LYMPH NODE Right 12/19/2016   Procedure: RADIOACTIVE SEED GUIDED AXILLARY SENTINEL  LYMPH NODE BIOPSY;  Surgeon: Excell Seltzer, MD;  Location: [place];  Service: General;  Laterality: Right;  . WISDOM TOOTH EXTRACTION  yrs ago    There were no vitals filed for this visit.   Subjective Assessment - 12/18/18 1235    Subjective  Patient is taking Aromasin for estrogen positive breast cancer. Vaginal dryness, fecal incontinence started 2 months ago. Appendectomy 11/28/2018.  Urinary leakage 24 years ago and becam bad  last 2 years.      Patient Stated Goals  learn how to control the leakage and how to manage vaginal dryness    Currently in Pain?  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   No    Is the patient reluctant to leave their home because of a fear of falling?   No      Home Film/video editor residence      Prior Function   Level of Independence  Independent    Vocation  On disability    Leisure  no exercise, gardening      Cognition   Overall Cognitive Status  Within Functional Limits for tasks assessed      Posture/Postural Control   Posture/Postural Control  Postural limitations    Postural Limitations  Rounded Shoulders;Forward head      ROM / Strength   AROM / PROM / Strength  PROM;AROM;Strength      AROM   Lumbar Flexion  full with limited in lumbar    Lumbar Extension  decreased by 50%    Lumbar - Right Side Bend  decreased by 25%    Lumbar - Left Side Bend  decreased by 25%      Strength   Right Hip External Rotation   4/5    Left Hip External Rotation  4/5      Palpation   Palpation comment  tightness in suprapubically, diaphgram                Objective measurements completed on examination: See above findings.    Pelvic Floor Special Questions - 12/18/18 0001    Prior Pregnancies  Yes    Number of Pregnancies  1    Number of Vaginal Deliveries  1     Currently Sexually Active  No    Urinary Leakage  Yes    Pad use  2    Activities that cause leaking  Coughing;Sneezing;With strong urge;Laughing    Urinary urgency  Yes    Fecal incontinence  Yes    Falling out feeling (prolapse)  No    Skin Integrity  Intact   dryness   External Palpation  no tenderness    Prolapse  Anterior Wall  grade 1   Pelvic Floor Internal Exam  Patient     Exam Type  Vaginal;Rectal    Palpation  tightness located in the post. rectum, urethra sphincter, perineal body and sides of introius    Strength  weak squeeze, no lift   anal and rectal   Strength # of seconds  5               PT Education - 12/18/18 1304    Education Details  pelvic floor contraction in sidely; information on scar massage for appendectomy scar    Person(s) Educated  Patient    Methods  Explanation;Demonstration;Verbal cues;Handout    Comprehension  Returned demonstration;Verbalized understanding       PT Short Term Goals - 12/18/18 1321      PT SHORT TERM GOAL #1   Title  independent with initial HEP    Baseline  not educated yet    Time  4    Period  Weeks    Status  New    Target Date  01/15/19        PT Long Term Goals - 12/18/18 1322      PT LONG TERM GOAL #1   Title  independent with HEP and how to progress herself    Baseline  not educated yet    Time  12    Period  Weeks    Status  New    Target Date  03/12/19      PT LONG TERM GOAL #2   Title  pelvic floor strength for anal and vaginal sphincter is >/= 35 holding for 10 seconds so her incontinence decreased >/= 75%    Baseline  pelvic floor strength is 2/5 holding for 5 sec    Time  12    Period  Weeks    Status  New    Target Date  03/12/19      PT LONG TERM GOAL #3   Title  education on vaginal moisturizers to improve tissue health to form a better contraction    Baseline  not educated yet    Time  12    Period  Weeks    Status  New    Target Date  03/12/19      PT LONG TERM GOAL #4    Title  understand bladder irritants and how they affect the bladder with urinary incontinence    Baseline  not educated yet    Time  12    Period  Weeks    Status  New    Target Date  03/12/19             Plan - 12/18/18 1312    Clinical Impression Statement  Patient is a 58 year old female s/p breast cancer with estrogen positive 06/21/2016. Patient is presently taking Aromasin. Patient has noticed vaginal dryness and fecal incontinence in the past 2 months. Patient has had urinary leakage for 24 years after her daughter was born. The urinary leakage is worse the past few months.  Patient pelvic floor strength for rectum and vaginal is 2/5. Patient has decreased mobility of posterior anal canal, and around the intoritus. Patient needed verbal cues to contract the muscles correctly instead of compensating with the gluteals. Patient has to go to the bathroom right away when she has the urge. Patient will leak urine with activity. Patient lumbar spine has decreased mobilty by 25%. Patient abdominal strength is 2/5. Patient had an appendectomy on 11/28/2018. She  has a scar on the left lower abdominal area that is sore. Patient will benefit from skilled therapy to improve pelvic floor strength to reduce continence and improve vaginal health to reduce the dryness and irritation.     History and Personal Factors relevant to plan of care:  Osteoporosis, s/p breast cancer 06/21/2016; s/p Appendectomy 11/28/2018    Clinical Presentation  Evolving    Clinical Presentation due to:  urinary leakage and fecal leakage making it difficult to socialize    Clinical Decision Making  Moderate    Rehab Potential  Excellent    Clinical Impairments Affecting Rehab Potential  Osteoporosis, s/p breast cancer 06/21/2016; s/p Appendectomy 11/28/2018    PT Frequency  1x / week    PT Duration  12 weeks    PT Treatment/Interventions  Biofeedback;Therapeutic activities;Therapeutic exercise;Neuromuscular  re-education;Manual techniques;Patient/family education;Dry needling    PT Next Visit Plan  vaginal moisturizers, pelvic floor exercise with ball squeeze and knees in and out, bladder irritants, abdominal soft tissue work to improve diaphgram mobility    Consulted and Agree with Plan of Care  Patient       Patient will benefit from skilled therapeutic intervention in order to improve the following deficits and impairments:  Increased fascial restricitons, Decreased coordination, Decreased activity tolerance, Decreased endurance, Decreased strength  Visit Diagnosis: Muscle weakness (generalized) - Plan: PT plan of care cert/re-cert  Other lack of coordination - Plan: PT plan of care cert/re-cert  Other muscle spasm - Plan: PT plan of care cert/re-cert     Problem List Patient Active Problem List   Diagnosis Date Noted  . Appendicitis 11/28/2018  . De Quervain's disease (tenosynovitis) 05/15/2018  . Bilateral carpal tunnel syndrome 05/15/2018  . Neuropathy due to chemotherapeutic drug ([place]) 02/11/2018  . Peripheral neuropathy 08/13/2017  . Osteoporosis 03/23/2017  . Breast cancer metastasized to axillary lymph node ([place]) 06/21/2016  . Malignant neoplasm of upper-outer quadrant of right breast in female, estrogen receptor positive ([place]) 06/21/2016  . Elevated blood pressure reading without diagnosis of hypertension 04/28/2016  . Herpes genitalis--since 2005 per patient   . Environmental and seasonal allergies 07/28/2012  . Fibromyalgia   . Anxiety 02/07/2012  . Depression (emotion) 01/04/2012  . Headache-migranes 01/04/2012    Earlie Counts, PT 12/18/18 1:27 PM   [facility] [address], [place], [place], [address] Phone: [phone]   Fax:  [phone]  Name: April [name] MRN: [id number] Date of Birth: [birth year]

## 2018-12-19 ENCOUNTER — Telehealth: Payer: Self-pay | Admitting: *Deleted

## 2018-12-19 NOTE — Telephone Encounter (Signed)
This RN spoke with pt per her call stating noted nausea since receiving the Prolia.  " I may have had a little nausea before but it really has gotten worse since getting that shot "  She denies any vomiting.  She has zofran in the home and used it with some benefit.  This RN discussed use of Tums ( or the like ) 4 times a day for the next 2 days for possible benefit.  April [name] verbalized understanding.

## 2018-12-26 ENCOUNTER — Ambulatory Visit: Payer: Medicaid Other | Admitting: Physical Therapy

## 2018-12-31 ENCOUNTER — Ambulatory Visit: Payer: Medicaid Other | Admitting: Physical Therapy

## 2018-12-31 ENCOUNTER — Encounter: Payer: Self-pay | Admitting: Physical Therapy

## 2018-12-31 DIAGNOSIS — M6281 Muscle weakness (generalized): Secondary | ICD-10-CM

## 2018-12-31 DIAGNOSIS — M62838 Other muscle spasm: Secondary | ICD-10-CM

## 2018-12-31 DIAGNOSIS — R278 Other lack of coordination: Secondary | ICD-10-CM

## 2018-12-31 NOTE — Patient Instructions (Addendum)
Moisturizers . They are used in the vagina to hydrate the mucous membrane that make up the vaginal canal. . Designed to keep a more normal acid balance (ph) . Once placed in the vagina, it will last between two to three days.  . Use 2-3 times per week at bedtime and last longer than 60 min. . Ingredients to avoid is glycerin and fragrance, can increase chance of infection . Should not be used just before sex due to causing irritation . Most are gels administered either in a tampon-shaped applicator or as a vaginal suppository. They are non-hormonal.   Types of Moisturizers . Samul Dada- drug store . Vitamin E vaginal suppositories- Whole foods, Amazon . Moist Again . Coconut oil- can break down condoms . Julva- (Do no use if on Tamoxifen) amazon . Yes moisturizer- amazon . NeuEve Silk , NeuEve Silver for menopausal or over 65 (if have severe vaginal atrophy or cancer treatments use NeuEve Silk for  1 month than move to The Pepsi)- [employer], MapleFlower.dk . Olive and Bee intimate cream- www.oliveandbee.com.au . Mae vaginal moisturizer- Amazon  Creams to use externally on the Vulva area  Albertson's (good for for cancer patients that had radiation to the area)- [place] or [employer].FlyingBasics.com.br  V-magic cream - amazon  Julva-amazon  Vital "V Wild Yam salve ( help moisturize and help with thinning vulvar area, does have Lanier by Irwin Brakeman labial moisturizer (Amazon,   Coconut oil   Things to avoid in the vaginal area . Do not use things to irritate the vulvar area . No lotions just specialized creams for the vulva area- Neogyn, V-magic, No soaps; can use Aveeno or Calendula cleanser if needed. Must be gentle . No deodorants . No douches . Good to sleep without underwear to let the vaginal area to air out . No scrubbing: spread the lips to let warm water rinse over labias and pat  dry    Lubrication . Used for intercourse to reduce friction . Avoid ones that have glycerin, warming gels, tingling gels, icing or cooling gel, scented . Avoid parabens due to a preservative similar to female sex hormone . May need to be reapplied once or several times during sexual activity . Can be applied to both partners genitals prior to vaginal penetration to minimize friction or irritation . Prevent irritation and mucosal tears that cause post coital pain and increased the risk of vaginal and urinary tract infections . Oil-based lubricants cannot be used with condoms due to breaking them down.  Least likely to irritate vaginal tissue.  . Plant based-lubes are safe . Silicone-based lubrication are thicker and last long and used for post-menopausal women  Vaginal Lubricators Here is a list of some suggested lubricators you can use for intercourse. Use the most hypoallergenic product.  You can place on you or your partner.   Slippery Stuff  Sylk or Sliquid Natural H2O ( good  if frequent UTI's)  Blossom Organics (www.blossom-organics.com)  Luvena   Coconut oil  PJur Woman Nude- water based lubricant, amazon  Uberlube- Amazon  Aloe Vera  Yes lubricant- [employer] Platinum-Silicone, Target, Walgreens  Olive and Bee intimate cream-  www.oliveandbee.com.au  Pink - Amazon Things to avoid in lubricants are glycerin, warming gels, tingling gels, icing or cooling  gels, and scented gels.  Also avoid Vaseline. KY jelly, Replens, and Astroglide kills good bacteria(lactobacilli)  Things to avoid in the vaginal area .  Do not use things to irritate the vulvar area . No lotions- see below . No soaps; can use Aveeno or Calendula cleanser if needed. Must be gentle . No deodorants . No douches . Good to sleep without underwear to let the vaginal area to air out . No scrubbing: spread the lips to let warm water rinse over labias and pat dry  Creams that can be used on the  [place] Releveum or [place] [employer]vest Gele     STRETCHING THE PELVIC FLOOR MUSCLES NO DILATOR  Supplies . Vaginal lubricant . Mirror (optional) . Gloves (optional) Positioning . Start in a semi-reclined position with your head propped up. Bend your knees and place your thumb or finger at the vaginal opening. Procedure . Apply a moderate amount of lubricant on the outer skin of your vagina, the labia minora.  Apply additional lubricant to your finger. Marland Kitchen Spread the skin away from the vaginal opening. Place the end of your finger at the opening. . Do a maximum contraction of the pelvic floor muscles. Tighten the vagina and the anus maximally and relax. . When you know they are relaxed, gently and slowly insert your finger into your vagina, directing your finger slightly downward, for 2-3 inches of insertion. . Relax and stretch the 6 o'clock position . Hold each stretch for _2 min__ and repeat __1_ time with rest breaks of _1__ seconds between each stretch. . Repeat the stretching in the 4 o'clock and 8 o'clock positions. . Total time should be _6__ minutes, _1__ x per day.  Note the amount of theme your were able to achieve and your tolerance to your finger in your vagina. . Once you have accomplished the techniques you may try them in standing with one foot resting on the tub, or in other positions.  This is a good stretch to do in the shower if you don't need to use lubricant.  At end move the thumb back and forth.  [place] [address], [place], [place] [address] Phone # [phone] Fax [phone]

## 2018-12-31 NOTE — Therapy (Signed)
[facility] [address] [place], [place], [address] Phone: [phone]   Fax:  [phone]  Physical Therapy Treatment  Patient Details  Name: April [name] MRN: [id number] Date of Birth: Dec 05, 1960 Referring Provider (PT): Lurline Del   Encounter Date: 12/31/2018  PT End of Session - 12/31/18 1113    Visit Number  2    Date for PT Re-Evaluation  03/12/19    Authorization Type  medicaid    Authorization Time Period  1/21/2/10    Authorization - Visit Number  2    Authorization - Number of Visits  4    PT Start Time  5188   came late   PT Stop Time  1145    PT Time Calculation (min)  33 min    Activity Tolerance  Patient tolerated treatment well    Behavior During Therapy  [facility] for tasks assessed/performed       Past Medical History:  Diagnosis Date  . Allergy 07/28/2012   Seasonal/Environmental allergies  . Anxiety 2013   Since 2013  . Arthritis 2014 per patient    knees and shoulders  . Bilateral ankle fractures 07/2015   Booted and resolved   . Cancer [facility]) dx June 22, 2016   right breast  . Depression 2013   Multiple  episodes  in past.  . Elevated cholesterol 2017  . Fibromyalgia 2013   diagnosed by Dr. [name]  . Genital herpes 2005   Has outbreaks monthly if not on preventative medication  . GERD (gastroesophageal reflux disease) 2013  . History of radiation therapy 02/07/17- 03/21/17   Right Breast- 4 field 25 fractions. 50 Gy to SCLV/PAB in 25 fractions. Right Breast Boost 10 gy in 5 fractions.  . Migraine 2013   migraines  . Neuromuscular disorder ([place]) 03/20/2017   neuropathy in fingers and toes from Chemo--intermittent  . Obesity   . Osteoporosis 03/23/2017   noted per bone density scan  . Peripheral neuropathy 08/13/2017  . Personal history of chemotherapy 11/2016  . Personal history of radiation therapy    4/18  . Right wrist fracture 06/2015   Resolved  . Scoliosis of  thoracic spine 01/04/2012  . Skin condition 2012   patient reports periodic episodes of severe itching. She will itch and then blister at areas including her arms, back, and buttocks.   . Urinary, incontinence, stress female 07/14/2016   patient reported    Past Surgical History:  Procedure Laterality Date  . AXILLARY LYMPH NODE DISSECTION Right 12/26/2016   Procedure: RIGHT AXILLARY LYMPH NODE DISSECTION;  Surgeon: Excell Seltzer, MD;  Location: [place];  Service: General;  Laterality: Right;  . BREAST LUMPECTOMY Right 2018  . BREAST LUMPECTOMY WITH NEEDLE LOCALIZATION Right 12/19/2016   Procedure: RIGHT BREAST NEEDLE LOCALIZED LUMPECTOMY, RIGHT RADIOACTIVE SEED TARGETED AXILLARY SENTINEL LYMPH NODE BIOPSY;  Surgeon: Excell Seltzer, MD;  Location: [place];  Service: General;  Laterality: Right;  . IR GENERIC HISTORICAL  10/09/2016   IR CV LINE INJECTION 10/09/2016 Aletta Edouard, MD [facility]  . LAPAROSCOPIC APPENDECTOMY N/A 11/28/2018   Procedure: APPENDECTOMY LAPAROSCOPIC;  Surgeon: [name], MD;  Location: [place];  Service: General;  Laterality: N/A;  . PORT-A-CATH REMOVAL Left 12/19/2016   Procedure: REMOVAL PORT-A-CATH;  Surgeon: Excell Seltzer, MD;  Location: [place];  Service: General;  Laterality: Left;  . PORTACATH PLACEMENT N/A 07/11/2016   Procedure: INSERTION PORT-A-CATH;  Surgeon: Excell Seltzer,  MD;  [facility];  Service: General;  Laterality: N/A;  . RADIOACTIVE SEED GUIDED AXILLARY SENTINEL LYMPH NODE Right 12/19/2016   Procedure: RADIOACTIVE SEED GUIDED AXILLARY SENTINEL LYMPH NODE BIOPSY;  Surgeon: Excell Seltzer, MD;  Location: [place];  Service: General;  Laterality: Right;  . WISDOM TOOTH EXTRACTION  yrs ago    There were no vitals filed for this visit.  Subjective Assessment - 12/31/18 1112    Subjective  I cancelled last visit due to being sick. Patient urinary  leakage is 70% better.     Patient Stated Goals  learn how to control the leakage and how to manage vaginal dryness    Currently in Pain?  No/denies                    Pelvic Floor Special Questions - 12/31/18 0001    Pelvic Floor Internal Exam  Patient     Exam Type  Vaginal    Strength  weak squeeze, no lift    Strength # of seconds  5        [facility] Adult PT Treatment/Exercise - 12/31/18 0001      Self-Care   Self-Care  Other Self-Care Comments    Other Self-Care Comments   information on vaginal moisturizers and lubricants, education on anatomy of the vaginal canal and vulva      Manual Therapy   Manual Therapy  Internal Pelvic Floor    Internal Pelvic Floor  bil. urethra sphincter, walls of introitus, bil. levator ani             PT Education - 12/31/18 1139    Education Details  information on vaginal moisturizers and lubricants, vaginal massage    Person(s) Educated  Patient    Methods  Explanation;Demonstration;Verbal cues;Handout    Comprehension  Returned demonstration;Verbalized understanding       PT Short Term Goals - 12/18/18 1321      PT SHORT TERM GOAL #1   Title  independent with initial HEP    Baseline  not educated yet    Time  4    Period  Weeks    Status  New    Target Date  01/15/19        PT Long Term Goals - 12/18/18 1322      PT LONG TERM GOAL #1   Title  independent with HEP and how to progress herself    Baseline  not educated yet    Time  12    Period  Weeks    Status  New    Target Date  03/12/19      PT LONG TERM GOAL #2   Title  pelvic floor strength for anal and vaginal sphincter is >/= 35 holding for 10 seconds so her incontinence decreased >/= 75%    Baseline  pelvic floor strength is 2/5 holding for 5 sec    Time  12    Period  Weeks    Status  New    Target Date  03/12/19      PT LONG TERM GOAL #3   Title  education on vaginal moisturizers to improve tissue health to form a better contraction     Baseline  not educated yet    Time  12    Period  Weeks    Status  New    Target Date  03/12/19      PT LONG TERM GOAL #4   Title  understand bladder irritants and how they affect the bladder with urinary incontinence    Baseline  not educated yet    Time  12    Period  Weeks    Status  New    Target Date  03/12/19            Plan - 12/31/18 1144    Clinical Impression Statement  Patient reports her urinary leakage is much better. Patient pelvic floor strength is 2/5 for 5 seconds. Patient understands vaginal moisturizers and lubricants. Patient will benefit from skilled therapy to improve pelvic floor strength to reduce continence and improve vaginal health to reduce the dryness and irritation.     Rehab Potential  Excellent    Clinical Impairments Affecting Rehab Potential  Osteoporosis, s/p breast cancer 06/21/2016; s/p Appendectomy 11/28/2018    PT Frequency  1x / week    PT Duration  12 weeks    PT Treatment/Interventions  Biofeedback;Therapeutic activities;Therapeutic exercise;Neuromuscular re-education;Manual techniques;Patient/family education;Dry needling    PT Next Visit Plan  pelvic floor exercise with ball squeeze and knees in and out, bladder irritants, abdominal soft tissue work to improve diaphgram mobility    Recommended Other Services  MD signed initial eval    Consulted and Agree with Plan of Care  Patient       Patient will benefit from skilled therapeutic intervention in order to improve the following deficits and impairments:  Increased fascial restricitons, Decreased coordination, Decreased activity tolerance, Decreased endurance, Decreased strength  Visit Diagnosis: Muscle weakness (generalized)  Other lack of coordination  Other muscle spasm     Problem List Patient Active Problem List   Diagnosis Date Noted  . Appendicitis 11/28/2018  . De Quervain's disease (tenosynovitis) 05/15/2018  . Bilateral carpal tunnel syndrome 05/15/2018  .  Neuropathy due to chemotherapeutic drug ([place]) 02/11/2018  . Peripheral neuropathy 08/13/2017  . Osteoporosis 03/23/2017  . Breast cancer metastasized to axillary lymph node ([place]) 06/21/2016  . Malignant neoplasm of upper-outer quadrant of right breast in female, estrogen receptor positive ([place]) 06/21/2016  . Elevated blood pressure reading without diagnosis of hypertension 04/28/2016  . Herpes genitalis--since 2005 per patient   . Environmental and seasonal allergies 07/28/2012  . Fibromyalgia   . Anxiety 02/07/2012  . Depression (emotion) 01/04/2012  . Headache-migranes 01/04/2012    Earlie Counts, PT 12/31/18 11:47 AM   [facility] [address] [place], [place], [address] Phone: [phone]   Fax:  [phone]  Name: April [name] MRN: [id number] Date of Birth: 12/18/1960

## 2019-01-07 ENCOUNTER — Ambulatory Visit: Payer: Medicaid Other | Attending: Oncology | Admitting: Physical Therapy

## 2019-01-07 ENCOUNTER — Encounter: Payer: Self-pay | Admitting: Physical Therapy

## 2019-01-07 DIAGNOSIS — M6281 Muscle weakness (generalized): Secondary | ICD-10-CM

## 2019-01-07 DIAGNOSIS — M62838 Other muscle spasm: Secondary | ICD-10-CM | POA: Insufficient documentation

## 2019-01-07 DIAGNOSIS — R278 Other lack of coordination: Secondary | ICD-10-CM | POA: Diagnosis present

## 2019-01-07 NOTE — Therapy (Signed)
[facility] [address], [place], [place], [address] Phone: [phone]   Fax:  [phone]  Physical Therapy Treatment  Patient Details  Name: April [name] MRN: [id number] Date of Birth: November 28, 1961 Referring Provider (PT): Lurline Del   Encounter Date: 01/07/2019  PT End of Session - 01/07/19 1109    Visit Number  3    Date for PT Re-Evaluation  03/12/19    Authorization Type  medicaid    Authorization Time Period  1/21/2/10    Authorization - Visit Number  3    Authorization - Number of Visits  4    PT Start Time  1106    PT Stop Time  1148    PT Time Calculation (min)  42 min    Activity Tolerance  Patient tolerated treatment well    Behavior During Therapy  [facility] for tasks assessed/performed       Past Medical History:  Diagnosis Date  . Allergy 07/28/2012   Seasonal/Environmental allergies  . Anxiety 2013   Since 2013  . Arthritis 2014 per patient    knees and shoulders  . Bilateral ankle fractures 07/2015   Booted and resolved   . Cancer [facility]) dx June 22, 2016   right breast  . Depression 2013   Multiple  episodes  in past.  . Elevated cholesterol 2017  . Fibromyalgia 2013   diagnosed by Dr. [name]  . Genital herpes 2005   Has outbreaks monthly if not on preventative medication  . GERD (gastroesophageal reflux disease) 2013  . History of radiation therapy 02/07/17- 03/21/17   Right Breast- 4 field 25 fractions. 50 Gy to SCLV/PAB in 25 fractions. Right Breast Boost 10 gy in 5 fractions.  . Migraine 2013   migraines  . Neuromuscular disorder ([place]) 03/20/2017   neuropathy in fingers and toes from Chemo--intermittent  . Obesity   . Osteoporosis 03/23/2017   noted per bone density scan  . Peripheral neuropathy 08/13/2017  . Personal history of chemotherapy 11/2016  . Personal history of radiation therapy    4/18  . Right wrist fracture 06/2015   Resolved  . Scoliosis of thoracic spine  01/04/2012  . Skin condition 2012   patient reports periodic episodes of severe itching. She will itch and then blister at areas including her arms, back, and buttocks.   . Urinary, incontinence, stress female 07/14/2016   patient reported    Past Surgical History:  Procedure Laterality Date  . AXILLARY LYMPH NODE DISSECTION Right 12/26/2016   Procedure: RIGHT AXILLARY LYMPH NODE DISSECTION;  Surgeon: Excell Seltzer, MD;  Location: [place];  Service: General;  Laterality: Right;  . BREAST LUMPECTOMY Right 2018  . BREAST LUMPECTOMY WITH NEEDLE LOCALIZATION Right 12/19/2016   Procedure: RIGHT BREAST NEEDLE LOCALIZED LUMPECTOMY, RIGHT RADIOACTIVE SEED TARGETED AXILLARY SENTINEL LYMPH NODE BIOPSY;  Surgeon: Excell Seltzer, MD;  Location: [place];  Service: General;  Laterality: Right;  . IR GENERIC HISTORICAL  10/09/2016   IR CV LINE INJECTION 10/09/2016 Aletta Edouard, MD [facility]  . LAPAROSCOPIC APPENDECTOMY N/A 11/28/2018   Procedure: APPENDECTOMY LAPAROSCOPIC;  Surgeon: [name], MD;  Location: [place];  Service: General;  Laterality: N/A;  . PORT-A-CATH REMOVAL Left 12/19/2016   Procedure: REMOVAL PORT-A-CATH;  Surgeon: Excell Seltzer, MD;  Location: [place];  Service: General;  Laterality: Left;  . PORTACATH PLACEMENT N/A 07/11/2016   Procedure: INSERTION PORT-A-CATH;  Surgeon: Excell Seltzer, MD;  [facility];  Service: General;  Laterality: N/A;  . RADIOACTIVE SEED GUIDED AXILLARY SENTINEL LYMPH NODE Right 12/19/2016   Procedure: RADIOACTIVE SEED GUIDED AXILLARY SENTINEL LYMPH NODE BIOPSY;  Surgeon: Excell Seltzer, MD;  Location: [place];  Service: General;  Laterality: Right;  . WISDOM TOOTH EXTRACTION  yrs ago    There were no vitals filed for this visit.  Subjective Assessment - 01/07/19 1107    Subjective  My urinary leakage is better. Big problem is first thing in the morning. Before  I get up I do the pelvic floor exercises. When I bring my legs off the bed is when I leak.     Patient Stated Goals  learn how to control the leakage and how to manage vaginal dryness    Multiple Pain Sites  No                       [facility] Adult PT Treatment/Exercise - 01/07/19 0001      Self-Care   Self-Care  Other Self-Care Comments    Other Self-Care Comments   information on osteoporosis and correct posture       Lumbar Exercises: Supine   Clam  10 reps;5 seconds    Clam Limitations  with pelvic floor contraction and lower abdominal    Other Supine Lumbar Exercises  hookly ball squeeze hold 5 seconds with pelvic floor contraction and abdominal bracing      Manual Therapy   Manual Therapy  Soft tissue mobilization;Joint mobilization    Joint Mobilization  rib mobilization to lower ribe cage to improve bucket handle movement    Soft tissue mobilization  abdominal soft tssue work to the fascial restrictions, scar tissue massage, soft tissue work to diaphgram; abdominal massage to improve peristalic motion of the intestines             PT Education - 01/07/19 1150    Education Details  pelvic contraction with assistance, bladder irritants, abdominal massage, information on osteoporosis    Person(s) Educated  Patient    Methods  Explanation;Demonstration;Verbal cues;Handout    Comprehension  Returned demonstration;Verbalized understanding       PT Short Term Goals - 01/07/19 1110      PT SHORT TERM GOAL #1   Title  independent with initial HEP    Time  4    Period  Weeks    Status  Achieved        PT Long Term Goals - 01/07/19 1110      PT LONG TERM GOAL #3   Title  education on vaginal moisturizers to improve tissue health to form a better contraction    Time  12    Period  Weeks    Status  Achieved      PT LONG TERM GOAL #4   Title  understand bladder irritants and how they affect the bladder with urinary incontinence    Time  12    Period   Weeks    Status  On-going            Plan - 01/07/19 1115    Clinical Impression Statement  Patient feels her urinary leakage is better. Patient leaks urine when she is bringing her legs off the bed first thing in the morning. Patient understands how to perform abdominal massage to improve constipation and reduce strain on the pelvic floor. The coconut oil is helping the vaginal dryness. After manual work the patient is having increased  rib movement and reduction of fascial restrictions in the abdomen. Patient is able to contract the lower abdominals with her pelvic floor exercises.  Patient will benefit from skilled therapy to improve pelvic floor strength to reduce continence and improve vaginal health to reduce the dryness and irritaiton.     Rehab Potential  Excellent    Clinical Impairments Affecting Rehab Potential  Osteoporosis, s/p breast cancer 06/21/2016; s/p Appendectomy 11/28/2018    PT Frequency  1x / week    PT Duration  12 weeks    PT Treatment/Interventions  Biofeedback;Therapeutic activities;Therapeutic exercise;Neuromuscular re-education;Manual techniques;Patient/family education;Dry needling    PT Next Visit Plan  write renewal for medicaid;  abdominal soft tissue work to improve diaphgram mobility; transitional movement with breath to contract the pelvic floor, hip rotators strength, internal soft tissue work    Oncologist with Plan of Care  Patient       Patient will benefit from skilled therapeutic intervention in order to improve the following deficits and impairments:  Increased fascial restricitons, Decreased coordination, Decreased activity tolerance, Decreased endurance, Decreased strength  Visit Diagnosis: Muscle weakness (generalized)  Other lack of coordination  Other muscle spasm     Problem List Patient Active Problem List   Diagnosis Date Noted  . Appendicitis 11/28/2018  . De Quervain's disease (tenosynovitis) 05/15/2018  . Bilateral  carpal tunnel syndrome 05/15/2018  . Neuropathy due to chemotherapeutic drug ([place]) 02/11/2018  . Peripheral neuropathy 08/13/2017  . Osteoporosis 03/23/2017  . Breast cancer metastasized to axillary lymph node ([place]) 06/21/2016  . Malignant neoplasm of upper-outer quadrant of right breast in female, estrogen receptor positive ([place]) 06/21/2016  . Elevated blood pressure reading without diagnosis of hypertension 04/28/2016  . Herpes genitalis--since 2005 per patient   . Environmental and seasonal allergies 07/28/2012  . Fibromyalgia   . Anxiety 02/07/2012  . Depression (emotion) 01/04/2012  . Headache-migranes 01/04/2012    Earlie Counts, PT 01/07/19 12:10 PM   [facility] [address] [place], [place], [address] Phone: [phone]   Fax:  [phone]  Name: April [name] MRN: [id number] Date of Birth: 03/08/1961

## 2019-01-07 NOTE — Patient Instructions (Addendum)
Certain foods and liquids will decrease the pH making the urine more acidic.  Urinary urgency increases when the urine has a low pH.  Most common irritants: alcohol, carbonated beverages and caffinated beverages.  Foods to avoid: apple juice, apples, ascorbic acid, canteloupes, chili, citrus fruits, coffee, cranberries, grapes, guava, peaches, pepper, pineapple, plums, strawberries, tea, tomatoes, and vinegar.  Drinking plenty of water may help to increase the pH and dilute out any of the effects of specific irritants.  Foods that are NOT irritating to the bladder include: Pears, papayas, sun-brewed teas, watermelons, non-citrus herbal teas, apricots, kava and low-acid instant drinks (Postum)                                             DO's and DON'T's   Avoid and/or Minimize positions of forward bending ( flexion)  Side bending and rotation of the trunk  Especially when movements occur together   When your back aches:   Don't sit down   Lie down on your back with a small pillow under your head and one under your knees or as outlined by our therapist. Or, lie in the 90/90 position ( on the floor with your feet and legs on the sofa with knees and hips bent to 90 degrees)  Tying or putting on your shoes:   Don't bend over to tie your shoes or put on socks.  Instead, bring one foot up, cross it over the opposite knee and bend forward (hinge) at the hips to so the task.  Keep your back straight.  If you cannot do this safely, then you need to use long handled assistive devices such as a shoehorn and sock puller.  Exercising:  Don't engage in ballistic types of exercise routines such as high-impact aerobics or jumping rope  Don't do exercises in the gym that bring you forward (abdominal crunches, sit-ups, touching your  toes, knee-to-chest, straight leg raising.)  Follow a regular exercise program that includes a variety of different weight-bearing activities, such as low-impact  aerobics, T' ai chi or walking as your physical therapist advises  Do exercises that emphasize return to normal body alignment and strengthening of the muscles that keep your back straight, as outlined in this program or by your therapist  Household tasks:  Don't reach unnecessarily or twist your trunk when mopping, sweeping, vacuuming, raking, making beds, weeding gardens, getting objects ou of cupboards, etc.  Keep your broom, mop, vacuum, or rake close to you and mover your whole body as you move them. Walk over to the area on which you are working. Arrange kitchen, bathroom, and bedroom shelves so that frequently used items may be reached without excessive bending, twisting, and reaching.  Use a sturdy stool if necessary.  Don't bend from the waist to pick up something up  Off the floor, out of the trunk of your car, or to brush your teeth, wash your face, etc.   Bend at the knees, keeping back straight as possible. Use a reacher if necessary.   Prevention of fracture is the so-called "BOTTOm -Line" in the management of OSTEOPOROSIS. Do not take unnecessary chances in movement. Once a compression fracture occurs, the process is very difficult to control; one fracture is frequently followed by many more.      Osteoporosis   What is Osteoporosis?  - A silent disease in which the  skeleton is weakened by decreased bone density. - Characterized by low bone mass, deterioration of bone, and increased risk of fracture postmenopausal (primary) or the result of an identifiable condition/event (secondary) - Commonly found in the wrists, spine, and hips; these are high-risk stress areas and very susceptible to fractures.  The Facts: - There are 1.5 million fractures/year o 500,000 spine; 250,000 hip with over 60,000 nursing home admissions secondary to hip fracture; and 200,000 wrist - After hip fracture, only 50% of people able to walk independently prior to the fracture return to independent  ambulation. - Bone mass: Peaks at age 11-30, and begins declining at age 49-50.   Osteoporosis is defined by the [facility]) as:  NOF/WHO Criteria for Interpreting Results of Bone Density Assessment  Results Diagnosis  Within 1 standard deviation (SD) of young adult mean Normal  Between 1 and -2.5 SD below mean, repeat in 2 years Low bone mass (osteopenia)  Greater than -2.5 SD below mean Osteoporosis  Greater than -2.5 SD below mean and one or more fragility fractures exist Severe Osteoporosis  *Results can be affected by positioning of the body in the DEXA scan, presence of current or old fractures, arthritis, extraneous calcifications.    Osteoporosis is not just a women's disease!  - 30-40% of women will develop osteoporosis - 5-15% of males will develop osteoporosis   What are the risk factors?  1. Female 2. Thin, small frame 3. Caucasian, Asian race 4. Early menopause (<42 years old)/amenorrhea/delayed puberty 8. Old age 23. Family history (fractures, stooped posture)\ 7. Low calcium diet 8. Sedentary lifestyle 9. Alcohol, Caffeine, Smoking 10. Malnutrition, GI Disease 11. Prolonged use of Glucocorticoids (Prednisone), Meds to treat asthma, arthritis, cancers, thyroid, and anti-seizure meds.  How do I know for sure?  Get a BONE DENSITY TEST!  This measures bone loss and it's painless, non-invasive, and only takes 5-10 minutes!  What can I do about it?  ? Decrease your risk factors (alcohol, caffeine, smoking) ? Helpful medications (see next page) ? Adequate Calcium and Vitamin D intake ? Get active! o Proper posture - Sit and stand tall! No slouching or twisting o Weight-Bearing Exercise - walking, stair climbing, elliptical; NO jogging or high-impact exercise. o Resistive Exercise - Cybex weight equipment, Nautilus, dumbbells, therabands  **Be sure to maintain proper alignment when lifting any weight!!  **When using equipment, avoid  abdominal exercises which involve "crunching" or curling or twisting the trunk, biceps machines, cross-country machines, moving handlebars, or ANY MACHINE WITH ROTATION OR FORWARD BENDING!!!           Approved Pharmacologic Management of Osteoporosis  Agent Approved for prevention Approved for treatment BMD increased spine/hip Fracture reduction  Estrogen/Hormone Therapy (Estrace, Estratab, Ogen, Premarin, Vivell, Prempro, Femhert, Orthoest) Yes Yes 3-6% 35% spine and hip  Bisphosphonates  (Fosamax, Actonel, Boniva) Yes Yes 3-8% 35-50% spine and non-spine  Calcitonin (Miacalcin, Calcimar, Fortical) No Yes 0-3% None stated  Raloxifene (Evista) Yes Yes 2-3% 30-55%  Parathyroid Hormone (Forteo) No Yes, only in those at high risk for fracture None stated 53-65%     Recommended Daily Calcium Intakes   Population Group NIH/NOF* (mg elemental calcium)  Children 1-10 years [phone]  Children 11-24 years 30-1500  Men and Women 25-64 years At least 1200  Pregnant/Lactating At least 1200  Postmenopausal women with hormone replacement therapy At least 1200  Postmenopausal women without   hormone replacement therapy At least 1200  Men and women 65 + At least 1200  *  In 1987, 1990, 1994, and 2000, the NIH held consensus conferences on osteoporosis and calcium.  This column shows the most recent recommendations regarding calcium intak for preventing and managing osteoporosis.          Calcium Content of Selected Foods  Dairy Foods Calcium Content (mg) Non-Dairy Foods Calcium Content (mg)  Buttermilk, 1 cup 300 Calcium-fortified juice, 1 cup 300  Milk, 1 cup 300 Salmon, canned with Bones, 2 oz 100  Lactaid milk, 1 cup 300-500 Oysters, raw 13-19 medium 226  Soy milk, 1 cup 200-300 Sardines, canned with bones, 3 oz 372  Yogurt (plain, lowfat) 1 cup 250-300 Shrimp, canned 3 oz 98  Frozen yogurt (fruit) 1 cup 200-600 Collard greens, cooked 1 cup 357  Cheddar,  mozzarella, or [place] cheese, 1oz 205 Broccoli, cooked 1 cup 78  Cottage cheese (lowfat) 4 oz 200 Soybeans, cooked 1 cup 131  Part-skim ricotta cheese, 4oz 335 Tofu, 4oz* *  Vanilla ice cream, 1 cup 120-300    *Calcium content of tofu varies depending on processing method; check nutritional label on package for precise calcium content.     Suggested Guidelines for Calcium Supplement Use:  ? Calcium is absorbed most efficiently if taken in small amounts throughout the day.  Always divide the daily dose into smaller amounts if the total daily dose is 500mg  or more per day.  The body cannot use more than 500mg  Calcium at any one time. ? The use of manufactured supplements is encouraged.  Calcium as bone meal or dolomite may contain lead or other heavy metals as contaminants. ? Calcium supplements should not be taken with high fiber meals or with bulk forming laxatives. ? If calcium carbonate is used as the supplement form, it should be taken with meals to assure that stomach acid production is present to facilitate optimal dissolution and absorption of calcium.  This is important if atrophic gastritis with hypo- or achlorhydria is present, which it is in 20-50% of older individuals. ? It is important to drink plenty of fluids while using the supplement to help reduce problems with side effects like constipation or bloating.  If these symptoms become a problem, switching to another form of supplement may be the answer. ? Another alternative is calcium-fortified foods, including fruit juices, cereals, and breads.  These foods are now marketed with added calcium and may be less likely to cause side effects. ? Those with personal or family histories of kidney stones should be monitored to assure that hypercalcuria does not occur. CALCIUM INTAKE QUIZ  Dairy products are the primary source of calcium for most people.  For a quick estimate of your daily calcium intake, complete the following  steps:  1. Use the chart below to determine your daily intake of calcium from diary foods. Servings of dairy per day 1 2 3 4 5 6 7 8   Milligrams (mg) of calcium: 250 [phone] 1250 1500 1750 2000   2.  Enter your total daily calcium intake from dairy foods:     _____mg  3.  Add 350 mg, which is the average for all other dietary sources:                 +            350 mg  4.  The sum of your total daily calcium intake:               ______mg  5.  Enter the recommended calcium  intake for your age from the chart below;         ______mg  6.  Enter your daily intake from step 4 above and subtract:                             -        _______mg  7.  The result is how much additional calcium you need:                                          ______mg      Recommended Daily Calcium Intake  Population Calcium (mg)  Children 1-10 years [phone]  Children 11-24 years 45-1500  Men and women 25-64 At least 1200  Pregnant/Lactating At least 1200  Postmenopausal women with hormone replacement therapy At least 1200  Postmenopausal women without hormone replacement therapy At least 1200  Men and women 65+ At least 1200       SAFETY TIPS FOR FALL PREVENTION   1. Remove throw rugs and make certain carpet edges are securely fastened to the floor.  2. Reduce clutter, especially in traffic areas of the home.   3. Install/maintain sturdy handrails at stairs.  4. Increase wattage of lighting in hallways, bathrooms, kitchens, stairwells, and entrances to home.  5. Use night-lights near bed, in hallways, and in bathroom to improve night safety.  6. Install safety handrails in shower, tub, and around toilet.  Bathtubs and shower stalls should have non-skid surfaces.  7. When you must reach for something high, use a safety step stool, one with wide steps and a friction surface to stand on.  A type equipped with a high handrail is preferred.  8. If a cane or other walking aid has  been recommended, use it to help increase your stability.  9. Wear supportive, cushioned, low-heeled shoes.  Avoid "scuffs" (backless bedroom slippers) and high heels.  10. Avoid rushing to answer a phone, doorbell, or anything else!  A portable phone that you can take from room to room with you is a good idea for security and safety.  11. Exercise regularly and stay active!!    [place] [email or web address]   Exercise for Osteoporosis; A Safe and Effective Way to Build Bone Density and Muscle Strength By: Burnard Hawthorne, M.A.  Suggestions for Improved Bone Health   - Bones need a DIET rich in vitamins and minerals  - Food rich in minerals: fish, seaweed, fermented soy (ex: miso, tempeh, tamari),  fermented foods (ex: Kimchi), cooked greens, yogurt*   - Vitamins and minerals need to be able to be absorbed and it helps to eat a diet  that is lower in acidity    - DO eat vegetables and foods listed above; drink plenty of water*   - AVOID consuming too much caffiene, meat, carbonated drinks, soda, alcohol,  tobacco products*  - Get enough SLEEP.  Sleep is the only time our bodies repair damaged tissue.  Your body needs 7-8 hours/night and sometimes more  - Reduce stress. Hormones produced by our bodies during prolonged periods of stress will cause an increased breakdown of bone tissue  -Exercise! Weight bearing and activities that load tendons and bones will help increase bone density  - DO work with your physical therapist on an exercise program that is right for  you including the following compenents: warm up, strength, flexibility, posture  awareness, balance  - AVOID bending forward combined with twisting, sit ups or crunches, double leg  extensions    *check with your doctor or nutritionist anytime you make dietary changes  About Abdominal Massage  Abdominal massage, also called external colon massage, is a self-treatment circular massage  technique that can reduce and eliminate gas and ease constipation. The colon naturally contracts in waves in a clockwise direction starting from inside the right hip, moving up toward the ribs, across the belly, and down inside the left hip.  When you perform circular abdominal massage, you help stimulate your colon's normal wave pattern of movement called peristalsis.  It is most beneficial when done after eating.  Positioning You can practice abdominal massage with oil while lying down, or in the shower with soap.  Some people find that it is just as effective to do the massage through clothing while sitting or standing.  How to Massage Start by placing your finger tips or knuckles on your right side, just inside your hip bone.  . Make small circular movements while you move upward toward your rib cage.   . Once you reach the bottom right side of your rib cage, take your circular movements across to the left side of the bottom of your rib cage.  . Next, move downward until you reach the inside of your left hip bone.  This is the path your feces travel in your colon. . Continue to perform your abdominal massage in this pattern for 10 minutes each day.     You can apply as much pressure as is comfortable in your massage.  Start gently and build pressure as you continue to practice.  Notice any areas of pain as you massage; areas of slight pain may be relieved as you massage, but if you have areas of significant or intense pain, consult with your healthcare provider.  Other Considerations . General physical activity including bending and stretching can have a beneficial massage-like effect on the colon.  Deep breathing can also stimulate the colon because breathing deeply activates the same nervous system that supplies the colon.   . Abdominal massage should always be used in combination with a bowel-conscious diet that is high in the proper type of fiber for you, fluids (primarily water), and a  regular exercise program. Adduction: Hip - Knees Together With Pelvic Floor (Hook-Lying)    Lie with hips and knees bent, towel roll between knees. Squeeze pelvic floor while pushing knees together. Hold for __5_ seconds. Rest for _5__ seconds. Repeat _10__ times. Do _2__ times a day.   Copyright  VHI. All rights reserved.  External Rotation: Hip - Knees Apart With Pelvic Floor (Hook-Lying)    Lie with hips and knees bent, band tied just above knees. Squeeze pelvic floor while pulling knees apart. Hold for __5_ seconds. Rest for __5_ seconds. Repeat _10__ times. Do _2__ times a day.   Copyright  VHI. All rights reserved.  [place] [address], [place], [place] [address] Phone # [phone] Fax [phone]

## 2019-01-13 ENCOUNTER — Ambulatory Visit: Payer: Medicaid Other | Admitting: Physical Therapy

## 2019-01-13 ENCOUNTER — Encounter: Payer: Self-pay | Admitting: Physical Therapy

## 2019-01-13 DIAGNOSIS — M6281 Muscle weakness (generalized): Secondary | ICD-10-CM | POA: Diagnosis not present

## 2019-01-13 DIAGNOSIS — R278 Other lack of coordination: Secondary | ICD-10-CM

## 2019-01-13 DIAGNOSIS — M62838 Other muscle spasm: Secondary | ICD-10-CM

## 2019-01-13 NOTE — Therapy (Signed)
[facility] [address] [place], [place], [address] Phone: [phone]   Fax:  [phone]  Physical Therapy Treatment  Patient Details  Name: April [name] MRN: [id number] Date of Birth: 10/03/1961 Referring Provider (PT): Lurline Del   Encounter Date: 01/13/2019  PT End of Session - 01/13/19 1311    Visit Number  4    Date for PT Re-Evaluation  03/12/19    Authorization Type  medicaid    Authorization Time Period  1/21/2/10    Authorization - Visit Number  4    Authorization - Number of Visits  4    PT Start Time  1230    PT Stop Time  1310    PT Time Calculation (min)  40 min    Activity Tolerance  Patient tolerated treatment well    Behavior During Therapy  [employer] for tasks assessed/performed       Past Medical History:  Diagnosis Date  . Allergy 07/28/2012   Seasonal/Environmental allergies  . Anxiety 2013   Since 2013  . Arthritis 2014 per patient    knees and shoulders  . Bilateral ankle fractures 07/2015   Booted and resolved   . Cancer [facility]) dx June 22, 2016   right breast  . Depression 2013   Multiple  episodes  in past.  . Elevated cholesterol 2017  . Fibromyalgia 2013   diagnosed by Dr. [name]  . Genital herpes 2005   Has outbreaks monthly if not on preventative medication  . GERD (gastroesophageal reflux disease) 2013  . History of radiation therapy 02/07/17- 03/21/17   Right Breast- 4 field 25 fractions. 50 Gy to SCLV/PAB in 25 fractions. Right Breast Boost 10 gy in 5 fractions.  . Migraine 2013   migraines  . Neuromuscular disorder ([place]) 03/20/2017   neuropathy in fingers and toes from Chemo--intermittent  . Obesity   . Osteoporosis 03/23/2017   noted per bone density scan  . Peripheral neuropathy 08/13/2017  . Personal history of chemotherapy 11/2016  . Personal history of radiation therapy    4/18  . Right wrist fracture 06/2015   Resolved  . Scoliosis of thoracic spine  01/04/2012  . Skin condition 2012   patient reports periodic episodes of severe itching. She will itch and then blister at areas including her arms, back, and buttocks.   . Urinary, incontinence, stress female 07/14/2016   patient reported    Past Surgical History:  Procedure Laterality Date  . AXILLARY LYMPH NODE DISSECTION Right 12/26/2016   Procedure: RIGHT AXILLARY LYMPH NODE DISSECTION;  Surgeon: Excell Seltzer, MD;  Location: [place];  Service: General;  Laterality: Right;  . BREAST LUMPECTOMY Right 2018  . BREAST LUMPECTOMY WITH NEEDLE LOCALIZATION Right 12/19/2016   Procedure: RIGHT BREAST NEEDLE LOCALIZED LUMPECTOMY, RIGHT RADIOACTIVE SEED TARGETED AXILLARY SENTINEL LYMPH NODE BIOPSY;  Surgeon: Excell Seltzer, MD;  Location: [place];  Service: General;  Laterality: Right;  . IR GENERIC HISTORICAL  10/09/2016   IR CV LINE INJECTION 10/09/2016 Aletta Edouard, MD [facility]  . LAPAROSCOPIC APPENDECTOMY N/A 11/28/2018   Procedure: APPENDECTOMY LAPAROSCOPIC;  Surgeon: [name], MD;  Location: [place];  Service: General;  Laterality: N/A;  . PORT-A-CATH REMOVAL Left 12/19/2016   Procedure: REMOVAL PORT-A-CATH;  Surgeon: Excell Seltzer, MD;  Location: [place];  Service: General;  Laterality: Left;  . PORTACATH PLACEMENT N/A 07/11/2016   Procedure: INSERTION PORT-A-CATH;  Surgeon: Excell Seltzer, MD;  [facility];  Service: General;  Laterality: N/A;  . RADIOACTIVE SEED GUIDED AXILLARY SENTINEL LYMPH NODE Right 12/19/2016   Procedure: RADIOACTIVE SEED GUIDED AXILLARY SENTINEL LYMPH NODE BIOPSY;  Surgeon: Excell Seltzer, MD;  Location: [place];  Service: General;  Laterality: Right;  . WISDOM TOOTH EXTRACTION  yrs ago    There were no vitals filed for this visit.  Subjective Assessment - 01/13/19 1235    Subjective  Patient reports her urinary leakage is 50% better. Patient wears 1 pad per day  and is 50% wet. Patient does not wear a pad a night.     Patient Stated Goals  learn how to control the leakage and how to manage vaginal dryness    Currently in Pain?  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    Referring Provider (PT)  Sarajane Jews Magrinat    Onset Date/Surgical Date  06/21/16    Prior Therapy  none for pelvic floor      Precautions   Precautions  Other (comment)    Precaution Comments  breast cancer      Restrictions   Weight Bearing Restrictions  No      [place] residence      Prior Function   Level of Independence  Independent    Vocation  On disability    Leisure  no exercise, gardening      Cognition   Overall Cognitive Status  Within Functional Limits for tasks assessed      Posture/Postural Control   Posture/Postural Control  Postural limitations    Postural Limitations  Rounded Shoulders;Forward head      AROM   Lumbar Flexion  full with limited in lumbar    Lumbar Extension  decreased by 50%    Lumbar - Right Side Bend  decreased by 25%    Lumbar - Left Side Bend  decreased by 25%      Strength   Right Hip External Rotation   4/5    Left Hip External Rotation  4/5      Palpation   Palpation comment  improved diaphgram and suprapubic tissue mobility                Pelvic Floor Special Questions - 01/13/19 0001    Pelvic Floor Internal Exam  Patient     Exam Type  Vaginal    Palpation  tightness located on bil. urethra sphincter    Strength  fair squeeze, definite lift    Strength # of seconds  6        [facility] Adult PT Treatment/Exercise - 01/13/19 0001      Lumbar Exercises: Seated   Other Seated Lumbar Exercises  ball squeeze with pelvic floor contraction 10 seconds 10 times; pelvic floor contraction 5  seconds 5 times      Lumbar Exercises: Supine   Other Supine Lumbar Exercises  hookly pelvic floor contraction 10 seconds 5 times             PT Education - 01/13/19 1309    Education Details  pelvic floor contraction in supine and sitting    Person(s) Educated  Patient    Methods  Explanation;Demonstration;Verbal cues;Handout    Comprehension  Verbalized understanding;Returned demonstration       PT Short Term Goals - 01/07/19 1110      PT SHORT TERM GOAL #1   Title  independent with initial HEP    Time  4    Period  Weeks    Status  Achieved        PT Long Term Goals - 01/13/19 1237      PT LONG TERM GOAL #1   Title  independent with HEP and how to progress herself    Baseline  still learning advanced exercises each visit    Period  Weeks    Status  On-going      PT LONG TERM GOAL #2   Title  pelvic floor strength for anal and vaginal sphincter is >/= 3/5 holding for 10 seconds so her incontinence decreased >/= 75%    Baseline  pelvic floor strength is 3/5 holding for 6 sec, pads 50% dryer    Time  12    Period  Weeks    Status  On-going      PT LONG TERM GOAL #3   Title  education on vaginal moisturizers to improve tissue health to form a better contraction    Time  12    Period  Weeks    Status  Achieved      PT LONG TERM GOAL #4   Title  understand bladder irritants and how they affect the bladder with urinary incontinence    Time  12    Period  Weeks    Status  Achieved            Plan - 01/13/19 1312    Clinical Impression Statement  Patient is wearing 1 pad per day that is 50% dryer. Patient understands how to use vaginal moisturizers to improve vaginal health. Patient understands what baldder irritants are and how they could influence bladder leakage. Patient will leak urine when she is bringing her legs off the bed first thing in the morning. Patient has increased rib cage movement and reduction of fascial restrictions in the abdomen.  Patient is starting to understand how to contract the lower abdominals to engage the pelvic floor. Patient is not having fecal incontinence anymore. Patient will benefit from skilled therapy to improve pelvic floor strength to reduce continence and improve vaginal health to reduce the dryness and irritation.     Rehab Potential  Excellent    Clinical Impairments Affecting Rehab Potential  Osteoporosis, s/p breast cancer 06/21/2016; s/p Appendectomy 11/28/2018    PT Frequency  1x / week    PT Duration  12 weeks    PT Treatment/Interventions  Biofeedback;Therapeutic activities;Therapeutic exercise;Neuromuscular re-education;Manual techniques;Patient/family education;Dry needling    PT Next Visit Plan   transitional movement with breath to contract the pelvic floor, hip rotators strength, internal soft tissue work; abdominal strength    Consulted and Agree with Plan of Care  Patient       Patient will benefit from skilled therapeutic intervention in order to improve the following deficits and impairments:  Increased fascial restricitons, Decreased coordination, Decreased activity tolerance, Decreased endurance, Decreased strength  Visit Diagnosis: Muscle weakness (generalized)  Other lack of coordination  Other muscle spasm     Problem List Patient Active Problem List   Diagnosis Date Noted  . Appendicitis 11/28/2018  . De Quervain's disease (tenosynovitis) 05/15/2018  . Bilateral carpal tunnel syndrome 05/15/2018  . Neuropathy due to chemotherapeutic drug ([place]) 02/11/2018  . Peripheral neuropathy 08/13/2017  .  Osteoporosis 03/23/2017  . Breast cancer metastasized to axillary lymph node ([place]) 06/21/2016  . Malignant neoplasm of upper-outer quadrant of right breast in female, estrogen receptor positive ([place]) 06/21/2016  . Elevated blood pressure reading without diagnosis of hypertension 04/28/2016  . Herpes genitalis--since 2005 per patient   . Environmental and seasonal allergies  07/28/2012  . Fibromyalgia   . Anxiety 02/07/2012  . Depression (emotion) 01/04/2012  . Headache-migranes 01/04/2012    Earlie Counts, PT 01/13/19 1:18 PM    [facility] [address] [place], [place], [address] Phone: [phone]   Fax:  [phone]  Name: ZOHA SPRANGER MRN: [id number] Date of Birth: 1961/10/16

## 2019-01-13 NOTE — Patient Instructions (Addendum)
Quick Contraction: Gravity Eliminated (Hook-Lying)    Lie with hips and knees bent. Quickly squeeze then fully relax pelvic floor. Perform __1_ sets of __5_. Rest for _1__ seconds between sets. Do _2_ times a day.   Copyright  VHI. All rights reserved.   Slow Contraction: Gravity Eliminated (Hook-Lying)    Lie with hips and knees bent. Slowly squeeze pelvic floor for _10__ seconds. Rest for _10__ seconds. Repeat _5__ times. Do __2_ times a day.   Copyright  VHI. All rights reserved.  Adduction: Hip - Knees Together (Sitting)    Sit with towel roll between knees. Push knees together. Hold for _10__ seconds. Rest for __10_ seconds. Repeat __10_ times. Do __1_ times a day.  Copyright  VHI. All rights reserved.  Slow Contraction: Gravity Resisted (Sitting)    Sitting, slowly squeeze pelvic floor for __5_ seconds. Rest for _5__ seconds. Repeat _5__ times. Do __2_ times a day. End off with 5 quick flicks Copyright  VHI. All rights reserved.  [facility] [address], [place], [place] [address] Phone # [phone] Fax [phone]

## 2019-01-22 ENCOUNTER — Ambulatory Visit: Payer: Medicaid Other | Admitting: Physical Therapy

## 2019-01-22 ENCOUNTER — Encounter: Payer: Self-pay | Admitting: Physical Therapy

## 2019-01-22 DIAGNOSIS — M6281 Muscle weakness (generalized): Secondary | ICD-10-CM

## 2019-01-22 DIAGNOSIS — R278 Other lack of coordination: Secondary | ICD-10-CM

## 2019-01-22 DIAGNOSIS — M62838 Other muscle spasm: Secondary | ICD-10-CM

## 2019-01-22 NOTE — Patient Instructions (Signed)
Access Code: [email or web address]  URL: [email or web address]  Date: 01/22/2019  Prepared by: Earlie Counts   Exercises  Hooklying Isometric Hip Flexion - 10 reps - 1 sets - 5 sec hold - 1x daily - 7x weekly  Bent Knee Fallouts - 10 reps - 1 sets - 1x daily - 7x weekly  Quadruped Transversus Abdominis Bracing - 10 reps - 1 sets - 5 sec hold - 1x daily - 7x weekly  Seated Pelvic Floor Contraction with Hip Abduction and Resistance Loop - 10 reps - 2 sets - 1x daily - 7x weekly  Seated Pelvic Floor Contraction with Isometric Hip Adduction - 10 reps - 1 sets - 10 sec hold - 1x daily - 7x weekly  [facility] [address] [place], [place] [address] Phone # [phone] Fax [phone]

## 2019-01-22 NOTE — Therapy (Signed)
[facility] [address], [place], [place], [address] Phone: [phone]   Fax:  [phone]  Physical Therapy Treatment  Patient Details  Name: April [name] MRN: [id number] Date of Birth: [birth year] Referring Provider (April [name]): Lurline Del   Encounter Date: 01/22/2019  April [name] End of Session - 01/22/19 0854    Visit Number  5    Date for April [name] Re-Evaluation  03/12/19    Authorization Type  medicaid    Authorization Time Period  01/20/2019-04/13/2019    Authorization - Visit Number  1    Authorization - Number of Visits  12    April [name] Start Time  0850    April [name] Stop Time  0930    April [name] Time Calculation (min)  40 min    Activity Tolerance  Patient tolerated treatment well    Behavior During Therapy  [facility] for tasks assessed/performed       Past Medical History:  Diagnosis Date  . Allergy 07/28/2012   Seasonal/Environmental allergies  . Anxiety 2013   Since 2013  . Arthritis 2014 per patient    knees and shoulders  . Bilateral ankle fractures 07/2015   Booted and resolved   . Cancer [facility]) dx June 22, 2016   right breast  . Depression 2013   Multiple  episodes  in past.  . Elevated cholesterol 2017  . Fibromyalgia 2013   diagnosed by Dr. [name]  . Genital herpes 2005   Has outbreaks monthly if not on preventative medication  . GERD (gastroesophageal reflux disease) 2013  . History of radiation therapy 02/07/17- 03/21/17   Right Breast- 4 field 25 fractions. 50 Gy to SCLV/PAB in 25 fractions. Right Breast Boost 10 gy in 5 fractions.  . Migraine 2013   migraines  . Neuromuscular disorder ([place]) 03/20/2017   neuropathy in fingers and toes from Chemo--intermittent  . Obesity   . Osteoporosis 03/23/2017   noted per bone density scan  . Peripheral neuropathy 08/13/2017  . Personal history of chemotherapy 11/2016  . Personal history of radiation therapy    4/18  . Right wrist fracture 06/2015   Resolved  . Scoliosis of  thoracic spine 01/04/2012  . Skin condition 2012   patient reports periodic episodes of severe itching. She will itch and then blister at areas including her arms, back, and buttocks.   . Urinary, incontinence, stress female 07/14/2016   patient reported    Past Surgical History:  Procedure Laterality Date  . AXILLARY LYMPH NODE DISSECTION Right 12/26/2016   Procedure: RIGHT AXILLARY LYMPH NODE DISSECTION;  Surgeon: Excell Seltzer, MD;  Location: [place];  Service: General;  Laterality: Right;  . BREAST LUMPECTOMY Right 2018  . BREAST LUMPECTOMY WITH NEEDLE LOCALIZATION Right 12/19/2016   Procedure: RIGHT BREAST NEEDLE LOCALIZED LUMPECTOMY, RIGHT RADIOACTIVE SEED TARGETED AXILLARY SENTINEL LYMPH NODE BIOPSY;  Surgeon: Excell Seltzer, MD;  Location: [place];  Service: General;  Laterality: Right;  . IR GENERIC HISTORICAL  10/09/2016   IR CV LINE INJECTION 10/09/2016 Aletta Edouard, MD [facility]  . LAPAROSCOPIC APPENDECTOMY N/A 11/28/2018   Procedure: APPENDECTOMY LAPAROSCOPIC;  Surgeon: [name], MD;  Location: [place];  Service: General;  Laterality: N/A;  . PORT-A-CATH REMOVAL Left 12/19/2016   Procedure: REMOVAL PORT-A-CATH;  Surgeon: Excell Seltzer, MD;  Location: [place];  Service: General;  Laterality: Left;  . PORTACATH PLACEMENT N/A 07/11/2016   Procedure: INSERTION PORT-A-CATH;  Surgeon: Excell Seltzer, MD;  [facility];  Service: General;  Laterality: N/A;  . RADIOACTIVE SEED GUIDED AXILLARY SENTINEL LYMPH NODE Right 12/19/2016   Procedure: RADIOACTIVE SEED GUIDED AXILLARY SENTINEL LYMPH NODE BIOPSY;  Surgeon: Excell Seltzer, MD;  Location: [place];  Service: General;  Laterality: Right;  . WISDOM TOOTH EXTRACTION  yrs ago    There were no vitals filed for this visit.  Subjective Assessment - 01/22/19 0856    Subjective  I have had a reduction in urinary leakage in the past few days.  4 days ago I woke up with urinating on myself without realizing it.     Patient Stated Goals  learn how to control the leakage and how to manage vaginal dryness    Currently in Pain?  No/denies    Multiple Pain Sites  No                       [facility] Adult April [name] Treatment/Exercise - 01/22/19 0001      Therapeutic Activites    Therapeutic Activities  ADL's    ADL's  sit to stand with breath and pelvic floor contraction; squat to lift item off mat at knee height with tactile cues to have spinal neutral and contract pelvic floor      Lumbar Exercises: Aerobic   Nustep  seat # 7; 6 min; level 2; no arms      Lumbar Exercises: Supine   Clam  10 reps;5 seconds    Clam Limitations  with pelvic floor contraction and lower abdominal    Isometric Hip Flexion  10 reps;5 seconds   each leg, pelvic floor contraction     Lumbar Exercises: Quadruped   Other Quadruped Lumbar Exercises  contract pelvic floor 5 sec in quadruped with lumbar flat      Manual Therapy   Manual Therapy  Myofascial release    Myofascial Release  using assistive device to pull the skin up from the muscle             April [name] Education - 01/22/19 0922    Education Details  Access Code: [email or web address] ; transitional movements with breath and pelvicfloor    Person(s) Educated  Patient    Methods  Demonstration;Explanation;Verbal cues;Handout    Comprehension  Returned demonstration;Verbalized understanding       April [name] Short Term Goals - 01/07/19 1110      April [name] SHORT TERM GOAL #1   Title  independent with initial HEP    Time  4    Period  Weeks    Status  Achieved        April [name] Long Term Goals - 01/13/19 1237      April [name] LONG TERM GOAL #1   Title  independent with HEP and how to progress herself    Baseline  still learning advanced exercises each visit    Period  Weeks    Status  On-going      April [name] LONG TERM GOAL #2   Title  pelvic floor strength for anal and vaginal sphincter is >/= 3/5 holding for 10 seconds so  her incontinence decreased >/= 75%    Baseline  pelvic floor strength is 3/5 holding for 6 sec, pads 50% dryer    Time  12    Period  Weeks    Status  On-going      April [name] LONG TERM GOAL #3   Title  education on vaginal moisturizers to improve tissue health to form a better contraction  Time  12    Period  Weeks    Status  Achieved      April [name] LONG TERM GOAL #4   Title  understand bladder irritants and how they affect the bladder with urinary incontinence    Time  12    Period  Weeks    Status  Achieved            Plan - 01/22/19 0900    Clinical Impression Statement  Patient has not worn a pad in 3 days. Four days ago she leaked urine in the morning without realizing it. Patient has tightness in lumbar making it difficult for the abdominal muscles to pull together. Patient is able to contract the abdominals at a low level. Patient is learning transitional movements with pelvic floor contraction and abdominal bracing. Patient will benefit from skilled therapy to improve pelvic floor strength to reduce continence and improve vaginal health to reduce the dryness and irritation.     Rehab Potential  Excellent    Clinical Impairments Affecting Rehab Potential  Osteoporosis, s/p breast cancer 06/21/2016; s/p Appendectomy 11/28/2018    April [name] Frequency  1x / week    April [name] Duration  12 weeks    April [name] Treatment/Interventions  Biofeedback;Therapeutic activities;Therapeutic exercise;Neuromuscular re-education;Manual techniques;Patient/family education;Dry needling    April [name] Next Visit Plan  soft tissue work to back and pull to help the abdominal engage; hip rotators strength, internal soft tissue work; abdominal strength    April [name] Home Exercise Plan  Access Code: [email or web address]     Consulted and Agree with Plan of Care  Patient       Patient will benefit from skilled therapeutic intervention in order to improve the following deficits and impairments:  Increased fascial restricitons, Decreased coordination, Decreased  activity tolerance, Decreased endurance, Decreased strength  Visit Diagnosis: Muscle weakness (generalized)  Other lack of coordination  Other muscle spasm     Problem List Patient Active Problem List   Diagnosis Date Noted  . Appendicitis 11/28/2018  . De Quervain's disease (tenosynovitis) 05/15/2018  . Bilateral carpal tunnel syndrome 05/15/2018  . Neuropathy due to chemotherapeutic drug ([place]) 02/11/2018  . Peripheral neuropathy 08/13/2017  . Osteoporosis 03/23/2017  . Breast cancer metastasized to axillary lymph node ([place]) 06/21/2016  . Malignant neoplasm of upper-outer quadrant of right breast in female, estrogen receptor positive ([place]) 06/21/2016  . Elevated blood pressure reading without diagnosis of hypertension 04/28/2016  . Herpes genitalis--since 2005 per patient   . Environmental and seasonal allergies 07/28/2012  . Fibromyalgia   . Anxiety 02/07/2012  . Depression (emotion) 01/04/2012  . Headache-migranes 01/04/2012    April [name], April [name] 01/22/19 9:32 AM    [facility] [address], [place], [place], [address] Phone: [phone]   Fax:  [phone]  Name: April [name] MRN: [id number] Date of Birth: 16-Jan-1961

## 2019-01-24 ENCOUNTER — Other Ambulatory Visit: Payer: Self-pay | Admitting: Oncology

## 2019-01-24 ENCOUNTER — Encounter: Payer: Medicaid Other | Admitting: Internal Medicine

## 2019-01-28 ENCOUNTER — Encounter: Payer: Self-pay | Admitting: Physical Therapy

## 2019-01-28 ENCOUNTER — Ambulatory Visit: Payer: Medicaid Other | Admitting: Physical Therapy

## 2019-01-28 DIAGNOSIS — M6281 Muscle weakness (generalized): Secondary | ICD-10-CM | POA: Diagnosis not present

## 2019-01-28 DIAGNOSIS — M62838 Other muscle spasm: Secondary | ICD-10-CM

## 2019-01-28 DIAGNOSIS — R278 Other lack of coordination: Secondary | ICD-10-CM

## 2019-01-28 NOTE — Patient Instructions (Signed)
Access Code: [email or web address]  URL: [email or web address]  Date: 01/28/2019  Prepared by: Earlie Counts   Exercises  Hooklying Isometric Hip Flexion - 10 reps - 1 sets - 5 sec hold - 1x daily - 7x weekly  Bent Knee Fallouts - 10 reps - 1 sets - 1x daily - 7x weekly  Quadruped Transversus Abdominis Bracing - 10 reps - 1 sets - 5 sec hold - 1x daily - 7x weekly  Seated Pelvic Floor Contraction with Hip Abduction and Resistance Loop - 10 reps - 2 sets - 1x daily - 7x weekly  Seated Pelvic Floor Contraction with Isometric Hip Adduction - 10 reps - 1 sets - 10 sec hold - 1x daily - 7x weekly  Standing Hip Abduction with Resistance at Ankles - 10 reps - 1 sets - 1x daily - 7x weekly  Hip Extension with Resistance Loop - 10 reps - 1 sets - 1x daily - 7x weekly  Seated Plantar Fascia Mobilization with Small Ball - 10 reps - 1 sets - 1x daily - 7x weekly  Gastroc Stretch on Wall - 2 reps - 1 sets - 30 sec hold - 1x daily - 7x weekly  Standing Ankle Dorsiflexion Stretch - 2 reps - 1 sets - 30 sec hold - 1x daily - 7x weekly  Quadruped Hip Abduction and External Rotation - 10 reps - 2 sets - 1x daily - 7x weekly  [facility] [address] [place], [place] [address] Phone # [phone] Fax [phone]

## 2019-01-28 NOTE — Therapy (Signed)
[facility] [address] [place], [place], [address] Phone: [phone]   Fax:  [phone]  Physical Therapy Treatment  Patient Details  Name: April [name] MRN: [id number] Date of Birth: September 26, 1961 Referring Provider (PT): Lurline Del   Encounter Date: 01/28/2019  PT End of Session - 01/28/19 1157    Visit Number  6    Date for PT Re-Evaluation  03/12/19    Authorization Type  medicaid    Authorization Time Period  01/20/2019-04/13/2019    Authorization - Visit Number  2    Authorization - Number of Visits  12    PT Start Time  4196   came late   PT Stop Time  1230    PT Time Calculation (min)  39 min    Activity Tolerance  Patient tolerated treatment well    Behavior During Therapy  [facility] for tasks assessed/performed       Past Medical History:  Diagnosis Date  . Allergy 07/28/2012   Seasonal/Environmental allergies  . Anxiety 2013   Since 2013  . Arthritis 2014 per patient    knees and shoulders  . Bilateral ankle fractures 07/2015   Booted and resolved   . Cancer [facility]) dx June 22, 2016   right breast  . Depression 2013   Multiple  episodes  in past.  . Elevated cholesterol 2017  . Fibromyalgia 2013   diagnosed by Dr. [name]  . Genital herpes 2005   Has outbreaks monthly if not on preventative medication  . GERD (gastroesophageal reflux disease) 2013  . History of radiation therapy 02/07/17- 03/21/17   Right Breast- 4 field 25 fractions. 50 Gy to SCLV/PAB in 25 fractions. Right Breast Boost 10 gy in 5 fractions.  . Migraine 2013   migraines  . Neuromuscular disorder ([place]) 03/20/2017   neuropathy in fingers and toes from Chemo--intermittent  . Obesity   . Osteoporosis 03/23/2017   noted per bone density scan  . Peripheral neuropathy 08/13/2017  . Personal history of chemotherapy 11/2016  . Personal history of radiation therapy    4/18  . Right wrist fracture 06/2015   Resolved  .  Scoliosis of thoracic spine 01/04/2012  . Skin condition 2012   patient reports periodic episodes of severe itching. She will itch and then blister at areas including her arms, back, and buttocks.   . Urinary, incontinence, stress female 07/14/2016   patient reported    Past Surgical History:  Procedure Laterality Date  . AXILLARY LYMPH NODE DISSECTION Right 12/26/2016   Procedure: RIGHT AXILLARY LYMPH NODE DISSECTION;  Surgeon: Excell Seltzer, MD;  Location: [place];  Service: General;  Laterality: Right;  . BREAST LUMPECTOMY Right 2018  . BREAST LUMPECTOMY WITH NEEDLE LOCALIZATION Right 12/19/2016   Procedure: RIGHT BREAST NEEDLE LOCALIZED LUMPECTOMY, RIGHT RADIOACTIVE SEED TARGETED AXILLARY SENTINEL LYMPH NODE BIOPSY;  Surgeon: Excell Seltzer, MD;  Location: [place];  Service: General;  Laterality: Right;  . IR GENERIC HISTORICAL  10/09/2016   IR CV LINE INJECTION 10/09/2016 Aletta Edouard, MD [facility]INTER[facility] RAD  . LAPAROSCOPIC APPENDECTOMY N/A 11/28/2018   Procedure: APPENDECTOMY LAPAROSCOPIC;  Surgeon: [name], MD;  Location: [place];  Service: General;  Laterality: N/A;  . PORT-A-CATH REMOVAL Left 12/19/2016   Procedure: REMOVAL PORT-A-CATH;  Surgeon: Excell Seltzer, MD;  Location: [place];  Service: General;  Laterality: Left;  . PORTACATH PLACEMENT N/A 07/11/2016   Procedure: INSERTION PORT-A-CATH;  Surgeon: Excell Seltzer,  MD;  [facility];  Service: General;  Laterality: N/A;  . RADIOACTIVE SEED GUIDED AXILLARY SENTINEL LYMPH NODE Right 12/19/2016   Procedure: RADIOACTIVE SEED GUIDED AXILLARY SENTINEL LYMPH NODE BIOPSY;  Surgeon: Excell Seltzer, MD;  Location: [place];  Service: General;  Laterality: Right;  . WISDOM TOOTH EXTRACTION  yrs ago    There were no vitals filed for this visit.  Subjective Assessment - 01/28/19 1155    Subjective  Leakage continues to be 75% better.     Patient  Stated Goals  learn how to control the leakage and how to manage vaginal dryness    Currently in Pain?  No/denies    Multiple Pain Sites  No                       [facility] Adult PT Treatment/Exercise - 01/28/19 0001      Lumbar Exercises: Supine   Clam  10 reps;1 second    Clam Limitations  with pelvic floor contraction and lower abdominal    Bridge  10 reps    Bridge Limitations  keep feet apart to decrease hamstring cramps    Isometric Hip Flexion  10 reps;5 seconds   each leg, pelvic floor contraction     Knee/Hip Exercises: Standing   Forward Lunges  Left;Right;1 set;5 reps   with pelvic floor contraction   Hip Abduction  Stengthening;Right;Left;1 set;10 reps;Knee straight    Abduction Limitations  red band    Hip Extension  Stengthening;Right;Left;1 set;10 reps    Extension Limitations  red band    Other Standing Knee Exercises  gastroc and soleus stretch bil.              PT Education - 01/28/19 1216    Education Details  Access Code: [email or web address]     Person(s) Educated  Patient    Methods  Explanation;Demonstration;Verbal cues;Handout    Comprehension  Verbalized understanding;Returned demonstration       PT Short Term Goals - 01/07/19 1110      PT SHORT TERM GOAL #1   Title  independent with initial HEP    Time  4    Period  Weeks    Status  Achieved        PT Long Term Goals - 01/28/19 1158      PT LONG TERM GOAL #1   Title  independent with HEP and how to progress herself    Baseline  still learning advanced exercises each visit    Time  12    Period  Weeks    Status  On-going      PT LONG TERM GOAL #2   Title  pelvic floor strength for anal and vaginal sphincter is >/= 3/5 holding for 10 seconds so her incontinence decreased >/= 75%    Baseline  pelvic floor strength is 3/5 holding for 6 sec, pads 50% dryer    Time  12    Period  Weeks    Status  On-going      PT LONG TERM GOAL #3   Title  education on vaginal moisturizers to  improve tissue health to form a better contraction    Time  12    Period  Weeks    Status  Achieved      PT LONG TERM GOAL #4   Title  understand bladder irritants and how they affect the bladder with urinary incontinence    Time  12  Period  Weeks    Status  Achieved            Plan - 01/28/19 1157    Clinical Impression Statement  Patient is unable to do lunges due to left foot pain. Patient fatuques easily due to being deconditioned. Patient urinary leakage has not changed  since last visit.  Patient has had some urinary leakage since last visit. Patient has not met any other goals this weak. Patient reports her vaginal dryness is 80% better. Patient will benefit from skilled therapy to improve pelvic floor strength to reduce continence and improve vaginal health to reduce  irritation.     Rehab Potential  Excellent    Clinical Impairments Affecting Rehab Potential  Osteoporosis, s/p breast cancer 06/21/2016; s/p Appendectomy 11/28/2018    PT Frequency  1x / week    PT Duration  12 weeks    PT Treatment/Interventions  Biofeedback;Therapeutic activities;Therapeutic exercise;Neuromuscular re-education;Manual techniques;Patient/family education;Dry needling    PT Next Visit Plan  soft tissue work to back and pull to help the abdominal engage; , internal soft tissue work; abdominal strength    PT Home Exercise Plan  Access Code: [email or web address]     Consulted and Agree with Plan of Care  Patient       Patient will benefit from skilled therapeutic intervention in order to improve the following deficits and impairments:  Increased fascial restricitons, Decreased coordination, Decreased activity tolerance, Decreased endurance, Decreased strength  Visit Diagnosis: Muscle weakness (generalized)  Other lack of coordination  Other muscle spasm     Problem List Patient Active Problem List   Diagnosis Date Noted  . Appendicitis 11/28/2018  . De Quervain's disease (tenosynovitis)  05/15/2018  . Bilateral carpal tunnel syndrome 05/15/2018  . Neuropathy due to chemotherapeutic drug ([place]) 02/11/2018  . Peripheral neuropathy 08/13/2017  . Osteoporosis 03/23/2017  . Breast cancer metastasized to axillary lymph node ([place]) 06/21/2016  . Malignant neoplasm of upper-outer quadrant of right breast in female, estrogen receptor positive ([place]) 06/21/2016  . Elevated blood pressure reading without diagnosis of hypertension 04/28/2016  . Herpes genitalis--since 2005 per patient   . Environmental and seasonal allergies 07/28/2012  . Fibromyalgia   . Anxiety 02/07/2012  . Depression (emotion) 01/04/2012  . Headache-migranes 01/04/2012    Earlie Counts, PT 01/28/19 12:27 PM   [facility] [address], [place], [place], [address] Phone: [phone]   Fax:  [phone]  Name: JOYLYN DUGGIN MRN: [id number] Date of Birth: 07/20/1961

## 2019-02-04 ENCOUNTER — Encounter: Payer: Medicaid Other | Admitting: Physical Therapy

## 2019-02-11 ENCOUNTER — Encounter: Payer: Self-pay | Admitting: Physical Therapy

## 2019-02-11 ENCOUNTER — Ambulatory Visit: Payer: Medicaid Other | Attending: Oncology | Admitting: Physical Therapy

## 2019-02-11 DIAGNOSIS — M62838 Other muscle spasm: Secondary | ICD-10-CM

## 2019-02-11 DIAGNOSIS — R278 Other lack of coordination: Secondary | ICD-10-CM | POA: Diagnosis present

## 2019-02-11 DIAGNOSIS — M6281 Muscle weakness (generalized): Secondary | ICD-10-CM | POA: Diagnosis not present

## 2019-02-11 NOTE — Therapy (Addendum)
[facility] [address] [place], [place], [address] Phone: [phone]   Fax:  [phone]  Physical Therapy Treatment  Patient Details  Name: April [name] MRN: [id number] Date of Birth: Apr 17, 1961 Referring Provider (PT): Lurline Del   Encounter Date: 02/11/2019  PT End of Session - 02/11/19 1208    Visit Number  7    Date for PT Re-Evaluation  03/12/19    Authorization Type  medicaid    Authorization Time Period  01/20/2019-04/13/2019    Authorization - Visit Number  3    Authorization - Number of Visits  12    PT Start Time  1200   came late   PT Stop Time  1225    PT Time Calculation (min)  25 min    Activity Tolerance  Patient tolerated treatment well    Behavior During Therapy  [facility] for tasks assessed/performed       Past Medical History:  Diagnosis Date  . Allergy 07/28/2012   Seasonal/Environmental allergies  . Anxiety 2013   Since 2013  . Arthritis 2014 per patient    knees and shoulders  . Bilateral ankle fractures 07/2015   Booted and resolved   . Cancer [facility]) dx June 22, 2016   right breast  . Depression 2013   Multiple  episodes  in past.  . Elevated cholesterol 2017  . Fibromyalgia 2013   diagnosed by Dr. [name]  . Genital herpes 2005   Has outbreaks monthly if not on preventative medication  . GERD (gastroesophageal reflux disease) 2013  . History of radiation therapy 02/07/17- 03/21/17   Right Breast- 4 field 25 fractions. 50 Gy to SCLV/PAB in 25 fractions. Right Breast Boost 10 gy in 5 fractions.  . Migraine 2013   migraines  . Neuromuscular disorder ([place]) 03/20/2017   neuropathy in fingers and toes from Chemo--intermittent  . Obesity   . Osteoporosis 03/23/2017   noted per bone density scan  . Peripheral neuropathy 08/13/2017  . Personal history of chemotherapy 11/2016  . Personal history of radiation therapy    4/18  . Right wrist fracture 06/2015   Resolved  .  Scoliosis of thoracic spine 01/04/2012  . Skin condition 2012   patient reports periodic episodes of severe itching. She will itch and then blister at areas including her arms, back, and buttocks.   . Urinary, incontinence, stress female 07/14/2016   patient reported    Past Surgical History:  Procedure Laterality Date  . AXILLARY LYMPH NODE DISSECTION Right 12/26/2016   Procedure: RIGHT AXILLARY LYMPH NODE DISSECTION;  Surgeon: Excell Seltzer, MD;  Location: [place];  Service: General;  Laterality: Right;  . BREAST LUMPECTOMY Right 2018  . BREAST LUMPECTOMY WITH NEEDLE LOCALIZATION Right 12/19/2016   Procedure: RIGHT BREAST NEEDLE LOCALIZED LUMPECTOMY, RIGHT RADIOACTIVE SEED TARGETED AXILLARY SENTINEL LYMPH NODE BIOPSY;  Surgeon: Excell Seltzer, MD;  Location: [place];  Service: General;  Laterality: Right;  . IR GENERIC HISTORICAL  10/09/2016   IR CV LINE INJECTION 10/09/2016 Aletta Edouard, MD [facility]  . LAPAROSCOPIC APPENDECTOMY N/A 11/28/2018   Procedure: APPENDECTOMY LAPAROSCOPIC;  Surgeon: [name], MD;  Location: [place];  Service: General;  Laterality: N/A;  . PORT-A-CATH REMOVAL Left 12/19/2016   Procedure: REMOVAL PORT-A-CATH;  Surgeon: Excell Seltzer, MD;  Location: [place];  Service: General;  Laterality: Left;  . PORTACATH PLACEMENT N/A 07/11/2016   Procedure: INSERTION PORT-A-CATH;  Surgeon: Excell Seltzer,  MD;  [facility];  Service: General;  Laterality: N/A;  . RADIOACTIVE SEED GUIDED AXILLARY SENTINEL LYMPH NODE Right 12/19/2016   Procedure: RADIOACTIVE SEED GUIDED AXILLARY SENTINEL LYMPH NODE BIOPSY;  Surgeon: Excell Seltzer, MD;  Location: [place];  Service: General;  Laterality: Right;  . WISDOM TOOTH EXTRACTION  yrs ago    There were no vitals filed for this visit.  Subjective Assessment - 02/11/19 1202    Subjective  I was not here last week due to hurting my knee  while gardening. I am seeing the doctor on Friday. I still feel like I still having urinary leakage. I use one pad during the day. I am doing my exercises at home.     Patient Stated Goals  learn how to control the leakage and how to manage vaginal dryness    Currently in Pain?  Yes    Pain Score  7    highest is 10/10   Pain Location  Knee    Pain Orientation  Left    Pain Descriptors / Indicators  Sore;Discomfort    Pain Type  Acute pain    Pain Onset  1 to 4 weeks ago    Pain Frequency  Constant    Aggravating Factors   pressing on left knee    Pain Relieving Factors  rest    Multiple Pain Sites  No                    Pelvic Floor Special Questions - 02/11/19 0001    Pelvic Floor Internal Exam  Patient     Exam Type  Vaginal    Strength  fair squeeze, definite lift        [facility] Adult PT Treatment/Exercise - 02/11/19 0001      Neuro Re-ed    Neuro Re-ed Details   pelvic floor contraction with straight leg raise, heel slides, quads sets      Lumbar Exercises: Aerobic   Nustep  seat # 7; 6 min; level 1; no arms      Manual Therapy   Manual Therapy  Internal Pelvic Floor    Internal Pelvic Floor  right urethra sphincter; bil. levator ani             PT Education - 02/11/19 1230    Education Details  Access Code: [email or web address]     Person(s) Educated  Patient    Methods  Explanation;Demonstration;Verbal cues;Handout    Comprehension  Verbalized understanding;Returned demonstration       PT Short Term Goals - 01/07/19 1110      PT SHORT TERM GOAL #1   Title  independent with initial HEP    Time  4    Period  Weeks    Status  Achieved        PT Long Term Goals - 02/11/19 1234      PT LONG TERM GOAL #1   Title  independent with HEP and how to progress herself    Baseline  still learning advanced exercises each visit    Time  12    Period  Weeks    Status  On-going      PT LONG TERM GOAL #2   Title  pelvic floor strength for anal and vaginal  sphincter is >/= 3/5 holding for 10 seconds so her incontinence decreased >/= 75%    Baseline  pelvic floor strength is 3/5 holding for 6 sec, pads 50% dryer  Time  12    Period  Weeks    Status  On-going      PT LONG TERM GOAL #3   Title  education on vaginal moisturizers to improve tissue health to form a better contraction    Time  12    Period  Weeks    Status  Achieved      PT LONG TERM GOAL #4   Title  understand bladder irritants and how they affect the bladder with urinary incontinence    Time  12    Period  Weeks    Status  Achieved            Plan - 02/11/19 1209    Clinical Impression Statement  Patient was working in the yard and hurt her left knee. Patient had tightness in the right urethra spincter and bil. levator ani. Patient pelvic floor contraction is 3/5. Patient will leak urine when she has the urge and pulling her pants down to urinate. Patient will benefit from skilled therapy to improve pelvic floor strength to reduce continence and improve vaginal health to reduce irritation.     Rehab Potential  Excellent    Clinical Impairments Affecting Rehab Potential  Osteoporosis, s/p breast cancer 06/21/2016; s/p Appendectomy 11/28/2018    PT Frequency  1x / week    PT Duration  12 weeks    PT Treatment/Interventions  Biofeedback;Therapeutic activities;Therapeutic exercise;Neuromuscular re-education;Manual techniques;Patient/family education;Dry needling    PT Next Visit Plan  work on core strength; work on holding pelvic floor contraction in sitting and standing and transitional movements    PT Home Exercise Plan  Access Code: [email or web address]     [email or web address] and Agree with Plan of Care  Patient       Patient will benefit from skilled therapeutic intervention in order to improve the following deficits and impairments:  Increased fascial restricitons, Decreased coordination, Decreased activity tolerance, Decreased endurance, Decreased strength  Visit Diagnosis: Muscle  weakness (generalized)  Other lack of coordination  Other muscle spasm     Problem List Patient Active Problem List   Diagnosis Date Noted  . Appendicitis 11/28/2018  . De Quervain's disease (tenosynovitis) 05/15/2018  . Bilateral carpal tunnel syndrome 05/15/2018  . Neuropathy due to chemotherapeutic drug ([place]) 02/11/2018  . Peripheral neuropathy 08/13/2017  . Osteoporosis 03/23/2017  . Breast cancer metastasized to axillary lymph node ([place]) 06/21/2016  . Malignant neoplasm of upper-outer quadrant of right breast in female, estrogen receptor positive ([place]) 06/21/2016  . Elevated blood pressure reading without diagnosis of hypertension 04/28/2016  . Herpes genitalis--since 2005 per patient   . Environmental and seasonal allergies 07/28/2012  . Fibromyalgia   . Anxiety 02/07/2012  . Depression (emotion) 01/04/2012  . Headache-migranes 01/04/2012    Earlie Counts, PT 02/11/19 12:36 PM   [facility] [address], [place], [place], [address] Phone: [phone]   Fax:  [phone]  Name: April [name] MRN: [id number] Date of Birth: 1961/06/03  PHYSICAL THERAPY DISCHARGE SUMMARY  Visits from Start of Care: 7  Current functional level related to goals / functional outcomes: See above. Patient treatment was interrupted due to COVID-19. Therapist contacted patient on 04/08/2019 and no return phone call since then.    Remaining deficits: See above.    Education / Equipment: HEP Plan:  Patient goals were partially met. Patient is being discharged due to not returning since the last visit.  Thank you for the referral. Earlie Counts, PT 04/10/19 2:45 PM  ?????

## 2019-02-11 NOTE — Patient Instructions (Signed)
Access Code: [email or web address]  URL: [email or web address]  Date: 02/11/2019  Prepared by: Earlie Counts   Exercises  Hooklying Isometric Hip Flexion - 10 reps - 1 sets - 5 sec hold - 1x daily - 7x weekly  Bent Knee Fallouts - 10 reps - 1 sets - 1x daily - 7x weekly  Quadruped Transversus Abdominis Bracing - 10 reps - 1 sets - 5 sec hold - 1x daily - 7x weekly  Seated Pelvic Floor Contraction with Hip Abduction and Resistance Loop - 10 reps - 2 sets - 1x daily - 7x weekly  Seated Pelvic Floor Contraction with Isometric Hip Adduction - 10 reps - 1 sets - 10 sec hold - 1x daily - 7x weekly  Standing Hip Abduction with Resistance at Ankles - 10 reps - 1 sets - 1x daily - 7x weekly  Hip Extension with Resistance Loop - 10 reps - 1 sets - 1x daily - 7x weekly  Seated Plantar Fascia Mobilization with Small Ball - 10 reps - 1 sets - 1x daily - 7x weekly  Gastroc Stretch on Wall - 2 reps - 1 sets - 30 sec hold - 1x daily - 7x weekly  Standing Ankle Dorsiflexion Stretch - 2 reps - 1 sets - 30 sec hold - 1x daily - 7x weekly  Quadruped Hip Abduction and External Rotation - 10 reps - 2 sets - 1x daily - 7x weekly  Supine Heel Slides - 10 reps - 2 sets - 1x daily - 7x weekly  Straight Leg Raise - 10 reps - 2 sets - 1x daily - 7x weekly  Long Sitting Quad Set - 10 reps - 1 sets - 5 sec hold - 1x daily - 7x weekly  [facility] [address] [place], [place] [address] Phone # [phone] Fax [phone]

## 2019-02-14 ENCOUNTER — Ambulatory Visit: Payer: Medicaid Other | Admitting: Internal Medicine

## 2019-02-14 ENCOUNTER — Encounter: Payer: Self-pay | Admitting: Internal Medicine

## 2019-02-14 ENCOUNTER — Other Ambulatory Visit: Payer: Self-pay

## 2019-02-14 VITALS — BP 122/80 | HR 84 | Resp 12 | Ht 63.0 in | Wt 223.0 lb

## 2019-02-14 DIAGNOSIS — M25562 Pain in left knee: Secondary | ICD-10-CM

## 2019-02-14 DIAGNOSIS — M76892 Other specified enthesopathies of left lower limb, excluding foot: Secondary | ICD-10-CM | POA: Diagnosis not present

## 2019-02-14 DIAGNOSIS — M7062 Trochanteric bursitis, left hip: Secondary | ICD-10-CM

## 2019-02-14 MED ORDER — MELOXICAM 15 MG PO TABS: ORAL_TABLET | ORAL | 1 refills | Status: DC

## 2019-02-14 NOTE — Progress Notes (Signed)
Subjective:    Patient ID: April [name], female   DOB: [birth year], 58 y.o.   MRN: [id number]   HPI   Bilateral knee pain, particularly the left.  Has been bothering her for 2 weeks.  Started out in the garden with lot of squatting and difficulty getting up from the squat position.  Not really kneeling.   Most of the pain is the lateral aspect of her knee.  She can have the pain shoot up to lateral thigh and under the left buttock.  No pain into back.  Can have pain all the way down to her foot.  The plantar aspect of her heel hurts as well. Left foot more numb than usual, but has chronic neuropathy from previous chemotherapy.  Current Meds  Medication Sig  . butalbital-acetaminophen-caffeine (FIORICET, ESGIC) 50-325-40 MG tablet TAKE 2 TABLETS BY MOUTH EVERY 6 HOURS AS NEEDED FOR HEADACHE/MIGRAINE (Patient taking differently: Take 2 tablets by mouth every 6 (six) hours as needed for migraine. TAKE 2 TABLETS BY MOUTH EVERY 6 HOURS AS NEEDED FOR HEADACHE/MIGRAINE)  . Calcium Carb-Cholecalciferol (CALCIUM/VITAMIN D) 600-400 MG-UNIT TABS Take 2 tablets by mouth daily. Reports starting January 2018  . cetirizine (ZYRTEC) 10 MG tablet Take 10 mg by mouth daily.  Marland Kitchen denosumab (PROLIA) 60 MG/ML SOSY injection Inject 60 mg into the skin every 6 (six) months.  . DULoxetine (CYMBALTA) 60 MG capsule TAKE 1 CAPSULE BY MOUTH ONCE DAILY  . esomeprazole (NEXIUM) 20 MG capsule Take 20 mg by mouth daily at 12 noon.  Marland Kitchen exemestane (AROMASIN) 25 MG tablet Take 1 tablet (25 mg total) by mouth daily after breakfast.  . fluticasone (FLONASE) 50 MCG/ACT nasal spray Place 2 sprays into both nostrils daily. (Patient taking differently: Place 2 sprays into both nostrils as needed for allergies or rhinitis. )  . ibuprofen (ADVIL,MOTRIN) 200 MG tablet Take 600 mg by mouth as needed for headache or moderate pain.  . valACYclovir (VALTREX) 1000 MG tablet 1/2 tab by mouth twice daily   Allergies  Allergen Reactions  .  Hydrocodone Nausea Only and Other (See Comments)    dizziness  . Ultram [Tramadol Hcl] Nausea Only  . Gabapentin Rash     Review of Systems    Objective:   BP 122/80 (BP Location: Left Arm, Patient Position: Sitting, Cuff Size: Large)   Pulse 84   Resp 12   Ht 5\' 3"  (1.6 m)   Wt 223 lb (101.2 kg)   BMI 39.50 kg/m   Physical Exam Bilateral knees:  Obese.  Decreased flexion to about 90 degrees with left knee only.  Full extension bilaterally.  No palpable effusion. Left knee tender over distal quadriceps and along lateral knee joint.  No definitive joint line tenderness medial or lateral.  No posterior knee tenderness or mass.  NT over anterior patella. No laxity or pain with stress of cruciates or collateral ligaments.   Tender over left greater trochanter and left lower lateral buttock area.   Motor 5/5.  Assessment & Plan   Left buttock, thigh and knee area pain:  Feel this is soft tissue with some bursitis at the thigh, perhaps piriformis/nerve compression at the buttock and distal quadricep inflammation as well.  Not clearly a definitive joint issue. Rest.  No deep knee bending--to hold on gardening for 1-2 weeks. May continue exercises she is doing with Urology for pelvic relaxation as long as helping. Switch from Ibuprofen to Meloxicam once daily and see if more prolonged relief.  To take with a snack at bedtime. To call if no improvement or worsens

## 2019-02-18 ENCOUNTER — Encounter: Payer: Medicaid Other | Admitting: Physical Therapy

## 2019-02-18 ENCOUNTER — Encounter: Payer: Self-pay | Admitting: Internal Medicine

## 2019-02-19 MED ORDER — DICLOFENAC SODIUM 1 % TD GEL: 4.0000 g | Freq: Four times a day (QID) | TRANSDERMAL | 4 refills | Status: DC

## 2019-02-21 ENCOUNTER — Other Ambulatory Visit: Payer: Self-pay | Admitting: Oncology

## 2019-02-23 ENCOUNTER — Encounter: Payer: Self-pay | Admitting: Internal Medicine

## 2019-02-25 ENCOUNTER — Encounter: Payer: Medicaid Other | Admitting: Physical Therapy

## 2019-02-25 MED ORDER — DICLOFENAC SODIUM 75 MG PO TBEC: DELAYED_RELEASE_TABLET | ORAL | 1 refills | Status: DC

## 2019-03-04 ENCOUNTER — Encounter: Payer: Medicaid Other | Admitting: Physical Therapy

## 2019-03-04 ENCOUNTER — Telehealth: Payer: Self-pay | Admitting: Oncology

## 2019-03-04 ENCOUNTER — Encounter: Payer: Self-pay | Admitting: Oncology

## 2019-03-04 ENCOUNTER — Encounter: Payer: Self-pay | Admitting: Internal Medicine

## 2019-03-04 NOTE — Telephone Encounter (Signed)
Called patient regarding change of appointment to phone visit for 04/07. Patient is notified.

## 2019-03-05 ENCOUNTER — Emergency Department (HOSPITAL_COMMUNITY)
Admission: EM | Admit: 2019-03-05 | Discharge: 2019-03-05 | Disposition: A | Discharge status: Home / Self Care | Payer: Medicaid Other | Attending: Emergency Medicine | Admitting: Emergency Medicine

## 2019-03-05 ENCOUNTER — Emergency Department (HOSPITAL_COMMUNITY): Payer: Medicaid Other

## 2019-03-05 ENCOUNTER — Ambulatory Visit (INDEPENDENT_AMBULATORY_CARE_PROVIDER_SITE_OTHER): Payer: Medicaid Other | Admitting: Internal Medicine

## 2019-03-05 ENCOUNTER — Telehealth: Payer: Self-pay

## 2019-03-05 ENCOUNTER — Encounter (HOSPITAL_COMMUNITY): Payer: Self-pay | Admitting: Emergency Medicine

## 2019-03-05 ENCOUNTER — Other Ambulatory Visit: Payer: Self-pay

## 2019-03-05 VITALS — HR 86 | Temp 99.1°F | Resp 12

## 2019-03-05 DIAGNOSIS — R6889 Other general symptoms and signs: Secondary | ICD-10-CM

## 2019-03-05 DIAGNOSIS — R05 Cough: Secondary | ICD-10-CM

## 2019-03-05 DIAGNOSIS — Z79899 Other long term (current) drug therapy: Secondary | ICD-10-CM | POA: Insufficient documentation

## 2019-03-05 DIAGNOSIS — R059 Cough, unspecified: Secondary | ICD-10-CM

## 2019-03-05 DIAGNOSIS — J189 Pneumonia, unspecified organism: Secondary | ICD-10-CM | POA: Diagnosis not present

## 2019-03-05 DIAGNOSIS — F17210 Nicotine dependence, cigarettes, uncomplicated: Secondary | ICD-10-CM | POA: Diagnosis not present

## 2019-03-05 LAB — POCT INFLUENZA A/B
Influenza A, POC: NEGATIVE
Influenza B, POC: NEGATIVE

## 2019-03-05 MED ORDER — ALBUTEROL SULFATE HFA 108 (90 BASE) MCG/ACT IN AERS
6.0000 | INHALATION_SPRAY | Freq: Once | RESPIRATORY_TRACT | Status: AC
  Administered 2019-03-05: 21:00:00 6 via RESPIRATORY_TRACT
  Filled 2019-03-05: qty 6.7

## 2019-03-05 MED ORDER — AZITHROMYCIN 250 MG PO TABS
500.0000 mg | ORAL_TABLET | Freq: Once | ORAL | Status: AC
  Administered 2019-03-05: 23:00:00 500 mg via ORAL
  Filled 2019-03-05: qty 2

## 2019-03-05 MED ORDER — AZITHROMYCIN 250 MG PO TABS: 250.0000 mg | ORAL_TABLET | Freq: Every day | ORAL | 0 refills | Status: DC

## 2019-03-05 MED ORDER — DOXYCYCLINE HYCLATE 100 MG PO TABS: 100.0000 mg | ORAL_TABLET | Freq: Once | ORAL | Status: DC

## 2019-03-05 MED ORDER — PREDNISONE 20 MG PO TABS
60.0000 mg | ORAL_TABLET | Freq: Once | ORAL | Status: AC
  Administered 2019-03-05: 60 mg via ORAL
  Filled 2019-03-05: qty 3

## 2019-03-05 MED ORDER — ALBUTEROL SULFATE HFA 108 (90 BASE) MCG/ACT IN AERS: 1.0000 | INHALATION_SPRAY | Freq: Four times a day (QID) | RESPIRATORY_TRACT | 0 refills | Status: DC | PRN

## 2019-03-05 NOTE — ED Notes (Signed)
ED Provider at bedside. 

## 2019-03-05 NOTE — Discharge Instructions (Signed)
You can take Tylenol or Ibuprofen as directed for pain. You can alternate Tylenol and Ibuprofen every 4 hours. If you take Tylenol at 1pm, then you can take Ibuprofen at 5pm. Then you can take Tylenol again at 9pm.   Take antibiotics as directed. Please take all of your antibiotics until finished.  Use albuterol inhaler as directed.   As we discussed, your presentation may be related to COVID-19. At this time, you do not meet criteria for testing. Your oxygen levels are stable here. Your vitals are otherwise stable. We are recommending that you self-quarantine at this time. You should limit visitors in your house and stay self-isolated for 2 weeks.   If you have trouble breathing, persistent fever despite medications, chest pain or any other worsening concerns, return to the emergency department. Otherwise, follow-up with your PCP.    Coronavirus (COVID-19) Are you at risk?  Are you at risk for the Coronavirus (COVID-19)?  To be considered HIGH RISK for Coronavirus (COVID-19), you have to meet the following criteria:   Traveled to [place], [place], [place], [place] or [place]; or in the [place] to [place], [place], [place], or [place]; and have fever, cough, and shortness of breath within the last 2 weeks of travel OR  Been in close contact with a person diagnosed with COVID-19 within the last 2 weeks and have fever, cough, and shortness of breath  IF YOU DO NOT MEET THESE CRITERIA, YOU ARE CONSIDERED LOW RISK FOR COVID-19.  What to do if you are HIGH RISK for COVID-19?   If you are having a medical emergency, call 911.  Seek medical care right away. Before you go to a doctors office, urgent care or emergency department, call ahead and tell them about your recent travel, contact with someone diagnosed with COVID-19, and your symptoms. You should receive instructions from your physicians office regarding next steps of care.   When you arrive at healthcare provider, tell  the healthcare staff immediately you have returned from visiting [place], [place], [place], [place] or [place]; or traveled in the [place] to [place], [place], [place], or [place]; in the last two weeks or you have been in close contact with a person diagnosed with COVID-19 in the last 2 weeks.    Tell the health care staff about your symptoms: fever, cough and shortness of breath.  After you have been seen by a medical provider, you will be either: o Tested for (COVID-19) and discharged home on quarantine except to seek medical care if symptoms worsen, and asked to  - Stay home and avoid contact with others until you get your results (4-5 days)  - Avoid travel on public transportation if possible (such as bus, train, or airplane) or o Sent to the Emergency Department by EMS for evaluation, COVID-19 testing, and possible admission depending on your condition and test results.  What to do if you are LOW RISK for COVID-19?  Reduce your risk of any infection by using the same precautions used for avoiding the common cold or flu:   Wash your hands often with soap and warm water for at least 20 seconds.  If soap and water are not readily available, use an alcohol-based hand sanitizer with at least 60% alcohol.   If coughing or sneezing, cover your mouth and nose by coughing or sneezing into the elbow areas of your shirt or coat, into a tissue or into your sleeve (not your hands).  Avoid shaking  hands with others and consider head nods or verbal greetings only.  Avoid touching your eyes, nose, or mouth with unwashed hands.   Avoid close contact with people who are sick.  Avoid places or events with large numbers of people in one location, like concerts or sporting events.  Carefully consider travel plans you have or are making.  If you are planning any travel outside or inside the [name], visit the [facility] webpage for the latest health notices.  If you have some  symptoms but not all symptoms, continue to monitor at home and seek medical attention if your symptoms worsen.  If you are having a medical emergency, call 911.   [place] / e-Visit: [email or web address]         [facility]: [facility]: [phone]                   [facility]: [phone]        Person Under Monitoring Name: April [name]  Location: [place] [address]   Infection Prevention Recommendations for Individuals Confirmed to have, or Being Evaluated for, 2019 Novel Coronavirus (COVID-19) Infection Who Receive Care at Home  Individuals who are confirmed to have, or are being evaluated for, COVID-19 should follow the prevention steps below until a healthcare provider or local or state health department says they can return to normal activities.  Stay home except to get medical care You should restrict activities outside your home, except for getting medical care. Do not go to work, school, or public areas, and do not use public transportation or taxis.  Call ahead before visiting your doctor Before your medical appointment, call the healthcare provider and tell them that you have, or are being evaluated for, COVID-19 infection. This will help the healthcare providers office take steps to keep other people from getting infected. Ask your healthcare provider to call the local or state health department.  Monitor your symptoms Seek prompt medical attention if your illness is worsening (e.g., difficulty breathing). Before going to your medical appointment, call the healthcare provider and tell them that you have, or are being evaluated for, COVID-19 infection. Ask your healthcare provider to call the local or state health department.  Wear a facemask You should wear a facemask that covers your  nose and mouth when you are in the same room with other people and when you visit a healthcare provider. People who live with or visit you should also wear a facemask while they are in the same room with you.  Separate yourself from other people in your home As much as possible, you should stay in a different room from other people in your home. Also, you should use a separate bathroom, if available.  Avoid sharing household items You should not share dishes, drinking glasses, cups, eating utensils, towels, bedding, or other items with other people in your home. After using these items, you should wash them thoroughly with soap and water.  Cover your coughs and sneezes Cover your mouth and nose with a tissue when you cough or sneeze, or you can cough or sneeze into your sleeve. Throw used tissues in a lined trash can, and immediately wash your hands with soap and water for at least 20 seconds or use an alcohol-based hand rub.  Wash your [facility] your hands often and thoroughly with soap and water for at least  20 seconds. You can use an alcohol-based hand sanitizer if soap and water are not available and if your hands are not visibly dirty. Avoid touching your eyes, nose, and mouth with unwashed hands.   Prevention Steps for Caregivers and Household Members of Individuals Confirmed to have, or Being Evaluated for, COVID-19 Infection Being Cared for in the Home  If you live with, or provide care at home for, a person confirmed to have, or being evaluated for, COVID-19 infection please follow these guidelines to prevent infection:  Follow healthcare providers instructions Make sure that you understand and can help the patient follow any healthcare provider instructions for all care.  Provide for the patients basic needs You should help the patient with basic needs in the home and provide support for getting groceries, prescriptions, and other personal needs.  Monitor the  patients symptoms If they are getting sicker, call his or her medical provider and tell them that the patient has, or is being evaluated for, COVID-19 infection. This will help the healthcare providers office take steps to keep other people from getting infected. Ask the healthcare provider to call the local or state health department.  Limit the number of people who have contact with the patient  If possible, have only one caregiver for the patient.  Other household members should stay in another home or place of residence. If this is not possible, they should stay  in another room, or be separated from the patient as much as possible. Use a separate bathroom, if available.  Restrict visitors who do not have an essential need to be in the home.  Keep older adults, very young children, and other sick people away from the patient Keep older adults, very young children, and those who have compromised immune systems or chronic health conditions away from the patient. This includes people with chronic heart, lung, or kidney conditions, diabetes, and cancer.  Ensure good ventilation Make sure that shared spaces in the home have good air flow, such as from an air conditioner or an opened window, weather permitting.  Wash your hands often  Wash your hands often and thoroughly with soap and water for at least 20 seconds. You can use an alcohol based hand sanitizer if soap and water are not available and if your hands are not visibly dirty.  Avoid touching your eyes, nose, and mouth with unwashed hands.  Use disposable paper towels to dry your hands. If not available, use dedicated cloth towels and replace them when they become wet.  Wear a facemask and gloves  Wear a disposable facemask at all times in the room and gloves when you touch or have contact with the patients blood, body fluids, and/or secretions or excretions, such as sweat, saliva, sputum, nasal mucus, vomit, urine, or feces.   Ensure the mask fits over your nose and mouth tightly, and do not touch it during use.  Throw out disposable facemasks and gloves after using them. Do not reuse.  Wash your hands immediately after removing your facemask and gloves.  If your personal clothing becomes contaminated, carefully remove clothing and launder. Wash your hands after handling contaminated clothing.  Place all used disposable facemasks, gloves, and other waste in a lined container before disposing them with other household waste.  Remove gloves and wash your hands immediately after handling these items.  Do not share dishes, glasses, or other household items with the patient  Avoid sharing household items. You should not share dishes, drinking glasses, cups,  eating utensils, towels, bedding, or other items with a patient who is confirmed to have, or being evaluated for, COVID-19 infection.  After the person uses these items, you should wash them thoroughly with soap and water.  Wash laundry thoroughly  Immediately remove and wash clothes or bedding that have blood, body fluids, and/or secretions or excretions, such as sweat, saliva, sputum, nasal mucus, vomit, urine, or feces, on them.  Wear gloves when handling laundry from the patient.  Read and follow directions on labels of laundry or clothing items and detergent. In general, wash and dry with the warmest temperatures recommended on the label.  Clean all areas the individual has used often  Clean all touchable surfaces, such as counters, tabletops, doorknobs, bathroom fixtures, toilets, phones, keyboards, tablets, and bedside tables, every day. Also, clean any surfaces that may have blood, body fluids, and/or secretions or excretions on them.  Wear gloves when cleaning surfaces the patient has come in contact with.  Use a diluted bleach solution (e.g., dilute bleach with 1 part bleach and 10 parts water) or a household disinfectant with a label that says  EPA-registered for coronaviruses. To make a bleach solution at home, add 1 tablespoon of bleach to 1 quart (4 cups) of water. For a larger supply, add  cup of bleach to 1 gallon (16 cups) of water.  Read labels of cleaning products and follow recommendations provided on product labels. Labels contain instructions for safe and effective use of the cleaning product including precautions you should take when applying the product, such as wearing gloves or eye protection and making sure you have good ventilation during use of the product.  Remove gloves and wash hands immediately after cleaning.  Monitor yourself for signs and symptoms of illness Caregivers and household members are considered close contacts, should monitor their health, and will be asked to limit movement outside of the home to the extent possible. Follow the monitoring steps for close contacts listed on the symptom monitoring form.   ? If you have additional questions, contact your local health department or call the epidemiologist on call at [phone] (available 24/7). ? This guidance is subject to change. For the most up-to-date guidance from [facility], [facility], [facility], please refer to their website: [email or web address]

## 2019-03-05 NOTE — Telephone Encounter (Signed)
Called pt to inform her per her report of symptoms to Dr Jana Hakim he would like for her to notify the [facility].  Pt states that she is currently at her PCP's office and they would be evaluating her.  No further concerns at this time.

## 2019-03-05 NOTE — Progress Notes (Signed)
    Subjective:    Patient ID: April [name], female   DOB: 1960-12-21, 58 y.o.   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  See nursing notes:  Patient states has had what she thought were allergy symptoms for about a week.  Outside doing a lot of yard work.   Taking Zyrtec regularly.  Not clear if using Flonase regularly. Last 2-3 days with more of a sore throat, body aches.   Last night around 6 p.m. felt terrible with body aches and cough.  Took temp orally of 100.1.  Took cold remedy with tylenol and 5 hours later felt worse again with oral temp of 100.2. Has an adult daughter who lives with her with similar symptoms of cough, body aches and fever who has continued to work at [employer]. Reported history of lower respiratory symptoms with allergies in past during spring pollen.  Patient also with history of breast cancer in 2017, completing treatment including neoadjuvant chemotherapy, right lumpectomy, and radiation in 2018.  Nasopharyngeal swab here for Influenza A & B negative.     No outpatient medications have been marked as taking for the 03/05/19 encounter (Office Visit) with [name], MD.   Allergies  Allergen Reactions  . Hydrocodone Nausea Only and Other (See Comments)    dizziness  . Ultram [Tramadol Hcl] Nausea Only  . Gabapentin Rash     Review of Systems    Objective:   Pulse 86   Temp 99.1 F (37.3 C) (Oral)   Resp 12   SpO2 94%   Physical Exam  Appears mildly ill Lungs:  Bilateral scattered crackles and wheeze. CV:  RRR without murmur or rub.   Assessment & Plan   Lower respiratory signs and symptoms with low grade fever, cough, myalgias. Tested negative for influenza A & B Sending to ED for adequate evaluation and treatment.  Concerned with her daughter's exposure to public with her job that this could be COVID-19. Discussed whether this is COVID-19 or not, she will need treatment of lower  respiratory symptoms in a better controlled setting.

## 2019-03-05 NOTE — ED Triage Notes (Addendum)
Pt reports cough x 1 week, reports fever x 2 days with generalized body aches. No known Covid sick contacts but daughter that lives with her tested positive for Flu A today.  Reports her PCP tested her for Covid today in their parking lot.

## 2019-03-05 NOTE — ED Provider Notes (Signed)
[facility] EMERGENCY DEPARTMENT Provider Note   CSN: [id number] Arrival date & time: 03/05/19  2017    History   Chief Complaint No chief complaint on file.   HPI April [name] is a 58 y.o. female who presents for evaluation of 1 week of cough, fever, congestion, body aches.  Patient reports that for the last week, she has had a productive cough with clear phlegm.  No hemoptysis.  She states that over the last 3 days, she started having some generalized body aches, congestion, fevers measured up to 100.2.  Patient reports that daughter recently started having same symptoms yesterday.  Patient went to PCP today where she had a negative flu test.  She was reportedly tested for COVID-19 but states that the test was not sent.  Patient states that her PCP told her to go to the emergency department given her cough and that her O2 sat was 94% on room air.  Patient reports that she went home and states that she was at home when her PCP called to see if she had gone to the emergency department.  Patient states she came in because her PCP told her to come.  She states that her cough has gotten slightly worse.  She states she feels like she has difficulty breathing whenever she gets in a coughing fit but does not have any shortness of breath at rest.  Patient reports some associated chest soreness that occurs with coughing.  No chest pain at rest.  Patient states that she has not had any recent travel.  Patient states that her daughter works at [employer] and has had similar symptoms.  Daughter saw PCP today and was positive for flu.  Patient denies any known COVID-19 exposure.  Denies any abdominal pain, nausea/vomiting.  She is a current smoker and reports smoking half pack of cigarettes a day.     The history is provided by the patient.    Past Medical History:  Diagnosis Date   Allergy 07/28/2012   Seasonal/Environmental allergies   Anxiety 2013   Since 2013   Arthritis  2014 per patient    knees and shoulders   Bilateral ankle fractures 07/2015   Booted and resolved    Cancer [facility]) dx June 22, 2016   right breast   Depression 2013   Multiple  episodes  in past.   Elevated cholesterol 2017   Fibromyalgia 2013   diagnosed by Dr. [name]   Genital herpes 2005   Has outbreaks monthly if not on preventative medication   GERD (gastroesophageal reflux disease) 2013   History of radiation therapy 02/07/17- 03/21/17   Right Breast- 4 field 25 fractions. 50 Gy to SCLV/PAB in 25 fractions. Right Breast Boost 10 gy in 5 fractions.   Migraine 2013   migraines   Neuromuscular disorder ([place]) 03/20/2017   neuropathy in fingers and toes from Chemo--intermittent   Obesity    Osteoporosis 03/23/2017   noted per bone density scan   Peripheral neuropathy 08/13/2017   Personal history of chemotherapy 11/2016   Personal history of radiation therapy    4/18   Right wrist fracture 06/2015   Resolved   Scoliosis of thoracic spine 01/04/2012   Skin condition 2012   patient reports periodic episodes of severe itching. She will itch and then blister at areas including her arms, back, and buttocks.    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   Peripheral neuropathy 08/13/2017   Osteoporosis 03/23/2017   Breast cancer metastasized to axillary lymph node ([place]) 06/21/2016   Malignant neoplasm of upper-outer quadrant of right breast in female, estrogen receptor positive ([place]) 06/21/2016   Elevated blood pressure reading without diagnosis of hypertension 04/28/2016   Herpes genitalis--since 2005 per patient    Environmental and seasonal allergies 07/28/2012   Fibromyalgia     Anxiety 02/07/2012   Depression (emotion) 01/04/2012   Headache-migranes 01/04/2012    Past Surgical History:  Procedure Laterality Date   AXILLARY LYMPH NODE DISSECTION Right 12/26/2016   Procedure: RIGHT AXILLARY LYMPH NODE DISSECTION;  Surgeon: Excell Seltzer, MD;  Location: [facility];  Service: General;  Laterality: Right;   BREAST LUMPECTOMY Right 2018   BREAST LUMPECTOMY WITH NEEDLE LOCALIZATION Right 12/19/2016   Procedure: RIGHT BREAST NEEDLE LOCALIZED LUMPECTOMY, RIGHT RADIOACTIVE SEED TARGETED AXILLARY SENTINEL LYMPH NODE BIOPSY;  Surgeon: Excell Seltzer, MD;  Location: [facility];  Service: General;  Laterality: Right;   IR GENERIC HISTORICAL  10/09/2016   IR CV LINE INJECTION 10/09/2016 Aletta Edouard, MD [facility]   LAPAROSCOPIC APPENDECTOMY N/A 11/28/2018   Procedure: APPENDECTOMY LAPAROSCOPIC;  Surgeon: [name], MD;  Location: [facility];  Service: General;  Laterality: N/A;   PORT-A-CATH REMOVAL Left 12/19/2016   Procedure: REMOVAL PORT-A-CATH;  Surgeon: Excell Seltzer, MD;  Location: [facility];  Service: General;  Laterality: Left;   PORTACATH PLACEMENT N/A 07/11/2016   Procedure: INSERTION PORT-A-CATH;  Surgeon: Excell Seltzer, MD;  Location: [facility];  Service: General;  Laterality: N/A;   RADIOACTIVE SEED GUIDED AXILLARY SENTINEL LYMPH NODE Right 12/19/2016   Procedure: RADIOACTIVE SEED GUIDED AXILLARY SENTINEL LYMPH NODE BIOPSY;  Surgeon: Excell Seltzer, MD;  Location: [facility];  Service: General;  Laterality: Right;   WISDOM TOOTH EXTRACTION  yrs ago     OB History    Gravida  3   Para  1   Term  1   Preterm      AB  2   Living  1     SAB      TAB  2   Ectopic      Multiple      Live Births               Home Medications    Prior to Admission medications   Medication Sig Start Date End Date Taking? Authorizing Provider  albuterol (PROVENTIL  HFA;VENTOLIN HFA) 108 (90 Base) MCG/ACT inhaler Inhale 1-2 puffs into the lungs every 6 (six) hours as needed for wheezing or shortness of breath. 03/05/19   Volanda Napoleon, PA-C  azithromycin (ZITHROMAX) 250 MG tablet Take 1 tablet (250 mg total) by mouth daily. Take first 2 tablets together, then 1 every day until finished. 03/05/19   Volanda Napoleon, PA-C  butalbital-acetaminophen-caffeine (FIORICET, ESGIC) 50-325-40 MG tablet TAKE 2 TABLETS BY MOUTH EVERY 6 HOURS AS NEEDED FOR HEADACHE/MIGRAINE Patient taking differently: Take 2 tablets by mouth every 6 (six) hours as needed for migraine. TAKE 2 TABLETS BY MOUTH EVERY 6 HOURS AS NEEDED FOR HEADACHE/MIGRAINE 10/23/17   Magrinat, Virgie Dad, MD  Calcium Carb-Cholecalciferol (CALCIUM/VITAMIN D) 600-400 MG-UNIT TABS Take 2 tablets by mouth daily. Reports starting January 2018    [provider]  calcium  carbonate (TUMS - DOSED IN MG ELEMENTAL CALCIUM) 500 MG chewable tablet Chew 1 tablet by mouth daily.    [provider]  cetirizine (ZYRTEC) 10 MG tablet Take 10 mg by mouth daily.    [provider]  denosumab (PROLIA) 60 MG/ML SOSY injection Inject 60 mg into the skin every 6 (six) months.    [provider]  diclofenac (VOLTAREN) 75 MG EC tablet 1 tab by mouth twice daily with food as needed 02/25/19   [name], MD  DULoxetine (CYMBALTA) 60 MG capsule TAKE 1 CAPSULE BY MOUTH ONCE DAILY 01/24/19   Magrinat, Virgie Dad, MD  ECHINACEA PO Take 2 capsules by mouth 2 (two) times daily as needed (Only takes when she is getting a cold).  07/02/17   [provider]  esomeprazole (NEXIUM) 20 MG capsule Take 20 mg by mouth daily at 12 noon.    [provider]  exemestane (AROMASIN) 25 MG tablet TAKE 1 TABLET BY MOUTH ONCE DAILY AFTER BREAKFAST 02/21/19   Magrinat, Virgie Dad, MD  fluticasone (FLONASE) 50 MCG/ACT nasal spray Place 2 sprays into both nostrils daily. Patient taking differently: Place 2  sprays into both nostrils as needed for allergies or rhinitis.  05/15/18   [name], MD  guaiFENesin (MUCINEX) 600 MG 12 hr tablet Take 600 mg by mouth 2 (two) times daily as needed. 12/12/18   [provider]  ibuprofen (ADVIL,MOTRIN) 200 MG tablet Take 600 mg by mouth as needed for headache or moderate pain.    [provider]  meloxicam (MOBIC) 15 MG tablet 1/2 to 1 tab by mouth daily for pain 02/14/19   [name], MD  Phenylephrine-DM-GG-APAP ([employer] COLD/FLU/SORE THROAT ADULT) 5-10-200-325 MG/10ML LIQD Take by mouth as needed. 12/12/18   [provider]  valACYclovir (VALTREX) 1000 MG tablet 1/2 tab by mouth twice daily 08/14/18   [name], MD    Family History Family History  Problem Relation Age of Onset   Arthritis Mother    Hypertension Mother    Heart disease Mother    Dementia Mother    Irritable bowel syndrome Mother    Emphysema Father    Cancer Father        bladder   Cerebral aneurysm Father        ruptured aneurysm was cause of death   Bipolar disorder Daughter        Not clear if this is the case.  Possibly Bipolar II   Depression Daughter    Berenice Primas' disease Sister    Vitiligo Sister    Mental illness Brother        Depression   Mental illness Sister        likely undiagnosed schizophrenia   Mental illness Brother        Schizophrenia    Social History Social History   Tobacco Use   Smoking status: Current Some Day Smoker    Packs/day: 0.25    Years: 15.00    Pack years: 3.75    Types: Cigarettes    Last attempt to quit: 01/21/1994    Years since quitting: 25.1   Smokeless tobacco: Never Used  Substance Use Topics   Alcohol use: Yes    Alcohol/week: 2.0 - 4.0 standard drinks    Types: 2 - 4 Standard drinks or equivalent per week    Comment: rarely   Drug use: Yes    Types: Marijuana    Comment: last smoked 6 months ago  Allergies   Hydrocodone; Ultram [tramadol hcl]; and  Gabapentin   Review of Systems Review of Systems  Constitutional: Positive for fever.  HENT: Positive for congestion.   Respiratory: Positive for cough. Negative for shortness of breath.   Cardiovascular: Negative for chest pain.  Gastrointestinal: Negative for abdominal pain, nausea and vomiting.  Genitourinary: Negative for dysuria and hematuria.  Musculoskeletal: Positive for myalgias.  Neurological: Negative for headaches.  All other systems reviewed and are negative.    Physical Exam Updated Vital Signs BP 125/74 (BP Location: Left Arm)    Pulse 97    Temp 98.5 F (36.9 C)    Resp (!) 22    SpO2 94%   Physical Exam Vitals signs and nursing note reviewed.  Constitutional:      Appearance: Normal appearance. She is well-developed.  HENT:     Head: Normocephalic and atraumatic.     Nose: Congestion present.  Eyes:     General: Lids are normal.     Conjunctiva/sclera: Conjunctivae normal.     Pupils: Pupils are equal, round, and reactive to light.  Neck:     Musculoskeletal: Full passive range of motion without pain.  Cardiovascular:     Rate and Rhythm: Normal rate and regular rhythm.     Pulses: Normal pulses.     Heart sounds: Normal heart sounds. No murmur. No friction rub. No gallop.   Pulmonary:     Effort: Pulmonary effort is normal.     Breath sounds: Rales present.     Comments: Intermittently coughing.  Diffuse Rales noted throughout all lung fields.  No wheezing.  No evidence of respiratory distress. Abdominal:     Palpations: Abdomen is soft. Abdomen is not rigid.     Tenderness: There is no abdominal tenderness. There is no guarding.     Comments: Abdomen is soft, non-distended, non-tender. No rigidity, No guarding. No peritoneal signs.  Musculoskeletal: Normal range of motion.  Skin:    General: Skin is warm and dry.     Capillary Refill: Capillary refill takes less than 2 seconds.  Neurological:     Mental Status: She is alert and oriented to  person, place, and time.  Psychiatric:        Speech: Speech normal.      ED Treatments / Results  Labs (all labs ordered are listed, but only abnormal results are displayed) Labs Reviewed - No data to display  EKG None  Radiology Dg Chest Portable 1 View  Result Date: 03/05/2019 CLINICAL DATA:  Cough, chest pain. EXAM: PORTABLE CHEST 1 VIEW COMPARISON:  Radiographs September 24, 2018. FINDINGS: The heart size and mediastinal contours are within normal limits. No pneumothorax or pleural effusion is noted. Right axillary surgical clips are noted. Mild interstitial densities are noted throughout both lungs concerning for scarring or possibly atypical inflammation or edema. The visualized skeletal structures are unremarkable. IMPRESSION: Mild bilateral interstitial densities are noted concerning for scarring or possibly atypical inflammation or edema. Electronically Signed   By: Marijo Conception, M.D.   On: 03/05/2019 21:16    Procedures Procedures (including critical care time)  Medications Ordered in ED Medications  predniSONE (DELTASONE) tablet 60 mg (60 mg Oral Given 03/05/19 2111)  albuterol (PROVENTIL HFA;VENTOLIN HFA) 108 (90 Base) MCG/ACT inhaler 6 puff (6 puffs Inhalation Given 03/05/19 2111)  azithromycin (ZITHROMAX) tablet 500 mg (500 mg Oral Given 03/05/19 2315)     Initial Impression / Assessment and Plan / ED Course  I have reviewed  the triage vital signs and the nursing notes.  Pertinent labs & imaging results that were available during my care of the patient were reviewed by me and considered in my medical decision making (see chart for details).        58 y.o. F who presents for evaluation of 1 week of cough, congestion with 3 days of generalized body aches, myalgias and fevers. Daughter at home with positive flu. Seen by PCP today and had negative flu. COVID testing done at PCP but PCP advised to go to to the ED for further evaluation. She is a current smoker. Patient is  afebrile, non-toxic appearing, sitting comfortably on examination table. Vitals stable. No evidence of hypoxia. She has diffuse rales noted throughout all lung fields. Plan for CXR. No indication to repeat COVID 19 testing.   CXR shows mild bilateral interstitial densities noted, concerning for scarring or possible atypical inflammation or edema.   Discussed results with patient.  Patient is sleeping comfortably on bed.  No evidence of distress.  Patient states that he feels like the albuterol inhaler helped her cough.  Patient states she is not currently having any trouble breathing.  Vitals are stable.  She is slightly tachypneic but O2 sat is 94% on room air.  She is not requiring any oxygen.  I discussed with patient regarding her chest x-ray findings.  We will plan to treat with azithromycin.,  Instructed patient to use albuterol  as directed.  I discussed with patient that while at this time, she does not meet criteria for repeat COVID-19 testing, her symptoms are concerning for possible COVID-19 etiology.  She had a COVID-19 test done by her PCP.  No indication for repeat at this time.  Additionally, patient meets no criteria for admission.  Discussed with patient that she should self splinting for 2 weeks.  Instructed her to closely monitor symptoms and return if she has any difficulty breathing. At this time, patient exhibits no emergent life-threatening condition that require further evaluation in ED or admission. Patient had ample opportunity for questions and discussion. All patient's questions were answered with full understanding. Strict return precautions discussed. Patient expresses understanding and agreement to plan.   April [name] was evaluated in Emergency Department on 03/05/2019 for the symptoms described in the history of present illness. She was evaluated in the context of the global COVID-19 pandemic, which necessitated consideration that the patient might be at risk for infection with  the SARS-CoV-2 virus that causes COVID-19. Institutional protocols and algorithms that pertain to the evaluation of patients at risk for COVID-19 are in a state of rapid change based on information released by regulatory bodies including the CDC and federal and state organizations. These policies and algorithms were followed during the patient's care in the ED.   Portions of this note were generated with [employer]. Dictation errors may occur despite best attempts at proofreading.   Final Clinical Impressions(s) / ED Diagnoses   Final diagnoses:  Cough  Community acquired pneumonia, unspecified laterality    ED Discharge Orders         Ordered    albuterol (PROVENTIL HFA;VENTOLIN HFA) 108 (90 Base) MCG/ACT inhaler  Every 6 hours PRN     03/05/19 2305    azithromycin (ZITHROMAX) 250 MG tablet  Daily     03/05/19 2305           Desma Mcgregor 03/05/19 2338    Little, Wenda Overland, MD 03/06/19 [phone]

## 2019-03-06 ENCOUNTER — Telehealth: Payer: Self-pay | Admitting: Internal Medicine

## 2019-03-06 NOTE — Telephone Encounter (Signed)
Patient ultimately did go to ED after I spoke with her last evening.  Her daughter was + for Influenza A at her doctor's office.    CXR showed bilateral interstitial densities consistent with scarring or atypical inflammation. Sat was the same at 95% Had diffuse crackles.   Testing for COVID 19 not done in ED, but to be treated as if she does have with findings on CXR and signs/symptoms. Discussed we will send her testing to [facility] over CDC information regarding how long to self isolate for both patient and daughter, regardless of + influenza for her daughter.   Discussed the 3 things currently recommended to look for to discontinue self isolation. Patient states the coughing fits are still quite painful, but once they are through she is okay.  Her breathing is not worse. Discussed she needs to go to ED immediately should she develop increasing dyspnea.

## 2019-03-07 ENCOUNTER — Telehealth: Payer: Self-pay | Admitting: Oncology

## 2019-03-07 ENCOUNTER — Telehealth: Payer: Self-pay | Admitting: Internal Medicine

## 2019-03-07 NOTE — Telephone Encounter (Signed)
Spoke with patient regarding her WebEx appointment on 4/7 and trained her on how to access the webex appointment. Emailed her to join link.

## 2019-03-07 NOTE — Telephone Encounter (Signed)
Feeling about the same, though no fever and has not taken anything for fever for maybe about 24-48 hours.  Cough is about the same. Daughter very congested and coughing. She will call in progress daily.

## 2019-03-07 NOTE — Addendum Note (Signed)
Addended by: Serafina Mitchell on: 03/07/2019 10:23 AM   Modules accepted: Orders

## 2019-03-09 ENCOUNTER — Telehealth: Payer: Self-pay | Admitting: *Deleted

## 2019-03-09 NOTE — Telephone Encounter (Signed)
[name] AFT-05 Study;  Called patient regarding her study visit this week on 03/11/19 which is scheduled as a WebEx visit.  Asked patient if it is ok for this research nurse to attend the WebEx visit and patient agreed.  Informed patient we are trying to complete as many of the study procedures as possible by phone or mail.  Informed patient I will mail her the Medication diaries for next 3 cycles of Exemestane along with a postage paid return addressed envelope for her to mail back the completed Medication diaries from past 3 cycles.  Patient agreed.  Asked patient if she would like to do the study questionnaires over the phone with research nurse or I can mail them to her to complete at home.  Patient states difficult to talk due to dry cough so she would prefer to complete them at home.  Informed patient I will mail the questionnaires to her as well with a postage paid return addressed envelope to mail back when completed.  Patient agreed to research nurse calling her tomorrow or the next day to review her home medications and adverse events, hoping her coughing may be a little better in the next few days.   Thanked patient so much for her time today.  Reminded patient to call PCP if any of her symptoms worsen.  She verbalized understanding.  Foye Spurling, BSN, RN Clinical Research Nurse 03/09/2019 4:34 PM

## 2019-03-10 ENCOUNTER — Encounter: Payer: Medicaid Other | Admitting: Internal Medicine

## 2019-03-10 ENCOUNTER — Other Ambulatory Visit: Payer: Self-pay | Admitting: Oncology

## 2019-03-10 ENCOUNTER — Telehealth: Payer: Self-pay | Admitting: *Deleted

## 2019-03-10 ENCOUNTER — Telehealth: Payer: Self-pay | Admitting: Internal Medicine
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
## 2019-03-10 NOTE — Telephone Encounter (Signed)
We were in contact daily over weekend with gradual improvement.  No fever since seen here on April 1. At 5:25 p.m. she states she is not having body aches now and congestion and cough are about the same. Some blood tinge to mucous at times.   She feels about the same now as yesterday.   Daughter feeling better with significant congestion and cough as well Encouraged not to go outside with the pollen and her allergies. To continue Albuterol HFA as needed.

## 2019-03-10 NOTE — Telephone Encounter (Addendum)
[name] AFT -05  Concomitant Medications reviewed over the phone with patient. Medication list updated. Reminded patient I will be on the phone during her WebEx visit tomorrow with Dr. Jana Hakim.  I will plan to call her after the visit either tomorrow or later this week to review AEs after she has visit with Dr. Jana Hakim.  Patient verbalized understanding.  Foye Spurling, BSN, RN Clinical Research Nurse 03/10/2019 3:30 PM

## 2019-03-11 ENCOUNTER — Inpatient Hospital Stay: Payer: Medicaid Other | Attending: Adult Health | Admitting: Oncology

## 2019-03-11 ENCOUNTER — Other Ambulatory Visit: Payer: Medicaid Other

## 2019-03-11 ENCOUNTER — Telehealth: Payer: Self-pay

## 2019-03-11 ENCOUNTER — Ambulatory Visit: Payer: Self-pay | Admitting: Physical Therapy

## 2019-03-11 DIAGNOSIS — Z170 Estrogen receptor positive status [ER+]: Secondary | ICD-10-CM

## 2019-03-11 DIAGNOSIS — C50919 Malignant neoplasm of unspecified site of unspecified female breast: Secondary | ICD-10-CM | POA: Diagnosis not present

## 2019-03-11 DIAGNOSIS — C50411 Malignant neoplasm of upper-outer quadrant of right female breast: Secondary | ICD-10-CM

## 2019-03-11 DIAGNOSIS — Z006 Encounter for examination for normal comparison and control in clinical research program: Secondary | ICD-10-CM

## 2019-03-11 DIAGNOSIS — C773 Secondary and unspecified malignant neoplasm of axilla and upper limb lymph nodes: Secondary | ICD-10-CM

## 2019-03-11 NOTE — Telephone Encounter (Signed)
Called and spoke wit patient to see how she is feeling today. Patient states today is the best day she has had in the last week or so. States she is not running a fever and headache and sore throat have subsided. Patient state she is still having a cough, congestion and a little bit of body aches. Patient states she only feels short of breath when she is having a really bad coughing spell. States her daughter has an appointment with her physician today do to her having a change in her symptoms. States her daughter now having sinus issues and pain in her face. States they are staying separate in the house.  Message has been discussed with Dr. Amil Amen will follow up with patient tomorrow.

## 2019-03-12 ENCOUNTER — Other Ambulatory Visit: Payer: Self-pay | Admitting: Oncology

## 2019-03-12 LAB — NOVEL CORONAVIRUS, NAA: SARS-CoV-2, NAA: NOT DETECTED

## 2019-03-13 ENCOUNTER — Other Ambulatory Visit: Payer: Self-pay | Admitting: Oncology

## 2019-03-13 ENCOUNTER — Telehealth: Payer: Self-pay

## 2019-03-13 ENCOUNTER — Encounter: Payer: Self-pay | Admitting: *Deleted

## 2019-03-13 DIAGNOSIS — Z170 Estrogen receptor positive status [ER+]: Principal | ICD-10-CM

## 2019-03-13 DIAGNOSIS — C50411 Malignant neoplasm of upper-outer quadrant of right female breast: Secondary | ICD-10-CM

## 2019-03-13 NOTE — Telephone Encounter (Signed)
Spoke with patient. Patient states she is feeling better and has not had a fever in a few days. States she is actually mowing the grass. Patient states she feels good.

## 2019-03-13 NOTE — Research (Signed)
[name] AFT-05 Cycle 24 Assessment; Due to Covid19 outbreak restrictions and related safety concerns, patient did not come into clinic for this study visit.  Lab: Not done due to [place] restrictions.  History and Physical: Physical exam not completed due to above Covid19 restrictions.  Patient's virtual visit was completed by Dr. Jana Hakim via WebEx. See MD note dated 03/11/19 for MD assessment. VS: VS and weight not obtained due to [place] restrictions. VS from ED visit 03/05/19 used for study VS data and were within study window. Weight was not obtained.  AE Review:  Reviewed over the phone with patient.  Patient reports flu-like symptoms from previous visit resolved a few days after her last visit in January.  Diarrhea also resolved after her January visit.  She reports mild nausea for few days after first Prolia injection in January.  Acute Left knee pain started 01/30/19 resolved 02/27/19. Patient started to have flu-like symptoms on 02/27/19 and now reports she was notified that her Covid19 test results are negative.  She says Dr. Urban Gibson told her the  Flu like symptoms are due to pneumonia seen on CXR on 03/05/19.  Patient denies any fevers in the past few days, she is still coughing but starting to feel a little better and even mowed her back yard yesterday.  Con Meds: Reviewed with patient by phone. See phone note on 03/10/19.   Drug Diaries: Mailed to patient. See phone note 03/09/19.  PROs: Mailed to patient. See phone note 03/09/19.   Patient states today she has not received them yet in the mail. Thanked patient for her time and ongoing participation in this study.  Informed patient of next study visit for End of Treatment visit is scheduled for 07/02/19.  Encouraged patient to contact research nurse if any questions or concerns prior to next appointment. She verbalized understanding.   Cycle: 21-23Adverse Events (12/18/18-03/11/19)  Event Grade Onset Date Resolved Date Status Attribution to Exemestane  Treatment Comments   Breast Pain (R)-int  1  07/03/17    ongoing  No  None Occasional- Sharp pain-brief  Arthritis-both great toes 1 08/13/17   ongoing No None   Radiation Pneumonitis 1 07/27/17 03/05/19 resolved No None Not shown on recent CXR  Fatigue 1 01/31/18  ongoing Yes  None   Headache  1 04/08/18  ongoing No Tylenol& Ibuprofen prn Intermittent  Joint disorder De Quervain's Tenosynovitis  1 09/24/18  ongoing No Rest, splint per PCP    Peripheral Neuropathy 2 08/22/17  ongoing No Lyrica stopped per patient   Paresthesia in arms: Carpal Tunnel Syndrome 2 05/15/18  ongoing No Spica and cock splint ordered by PCP   Alopecia-mild hair thinning 1 07/01/18  ongoing Yes None   Vaginal Dryness 1 09/03/18  ongoing Yes None   Diarrhea 1 09/28/18 12/18/18 resolved No None   Flu like symptoms 2 12/12/18 12/19/18 resolved No OTC cough and flu medication   Nausea 1 12/17/18 12/20/18 resolved No TUMs After Prolia injection  Fecal Incontinence 1 10/18/18 01/13/19 resolved No None Per PHT note  Left Knee Pain 2 01/30/19 02/27/19 resolved No Meloxicam   Flu like symptoms 2 02/27/19  ongoing No Albuterol, Azithromax Pneumonia. Flu and Covid19 tests negative   Foye Spurling, BSN, RN Clinical Research Nurse 03/13/2019

## 2019-03-14 ENCOUNTER — Other Ambulatory Visit: Payer: Self-pay | Admitting: Oncology

## 2019-03-24 ENCOUNTER — Other Ambulatory Visit: Payer: Self-pay | Admitting: Oncology

## 2019-03-25 ENCOUNTER — Telehealth: Payer: Self-pay | Admitting: *Deleted

## 2019-03-25 NOTE — Telephone Encounter (Signed)
[place] patient to follow up on study PROs and Medication Diaries mailed to patient 2 weeks ago.  Patient states she did get them, completed them and put in the mail yesterday.  Thanked patient very much for completing them.  Asked patient how she is feeling today and she states she is feeling much better, her flu like symptoms have resolved completely by last weekend (03/15/19).  She states she started getting a little congestion again in the past few days but feels like this is her usual allergy symptoms and due to spending a lot of time outside this past week. Thanked patient again and reminded her to call if any questions or concerns prior to next visit. She verbalized understanding.  Foye Spurling, BSN, RN Clinical Research Nurse 03/25/2019 9:28 AM

## 2019-03-28 ENCOUNTER — Encounter: Payer: Self-pay | Admitting: *Deleted

## 2019-03-28 NOTE — Progress Notes (Signed)
Received Completed questionnaires for visit 12 in mail today.  Patient attached a note that she has misplaced her medication diaries but is looking for them and will send them as soon as possible. Will follow up with patient next week to see if she has found the diaries.  Another pre paid, addressed envelope mailed to patient for the diaries.  Foye Spurling, BSN, RN Clinical Research Nurse 03/28/2019 2:26 PM

## 2019-04-08 ENCOUNTER — Telehealth: Payer: Self-pay | Admitting: Physical Therapy

## 2019-04-08 NOTE — Telephone Encounter (Signed)
Called patient and left a message to see if she wants to continue therapy.  Earlie Counts, PT @5 /04/2019@ 10:06 AM

## 2019-04-22 ENCOUNTER — Telehealth: Payer: Self-pay | Admitting: *Deleted

## 2019-04-22 NOTE — Telephone Encounter (Signed)
LVM for patient to follow up on [place] for cycles 21, 22 and 23.  Asked patient to return call to let research nurse know if she has mailed them yet. Thanked patient for her time.  Foye Spurling, BSN, RN Clinical Research Nurse 04/22/2019 10:29 AM

## 2019-04-29 ENCOUNTER — Encounter: Payer: Self-pay | Admitting: Internal Medicine

## 2019-04-29 ENCOUNTER — Other Ambulatory Visit: Payer: Self-pay

## 2019-04-29 ENCOUNTER — Other Ambulatory Visit: Payer: Self-pay | Admitting: Internal Medicine

## 2019-04-29 ENCOUNTER — Ambulatory Visit: Payer: Medicaid Other | Admitting: Internal Medicine

## 2019-04-29 VITALS — BP 130/80 | HR 76 | Temp 97.4°F | Resp 12 | Ht 63.0 in | Wt 220.0 lb

## 2019-04-29 DIAGNOSIS — L853 Xerosis cutis: Secondary | ICD-10-CM | POA: Diagnosis not present

## 2019-04-29 DIAGNOSIS — G8929 Other chronic pain: Secondary | ICD-10-CM

## 2019-04-29 DIAGNOSIS — M810 Age-related osteoporosis without current pathological fracture: Secondary | ICD-10-CM

## 2019-04-29 DIAGNOSIS — M722 Plantar fascial fibromatosis: Secondary | ICD-10-CM | POA: Diagnosis not present

## 2019-04-29 DIAGNOSIS — M25562 Pain in left knee: Secondary | ICD-10-CM

## 2019-04-29 DIAGNOSIS — Z6838 Body mass index (BMI) 38.0-38.9, adult: Secondary | ICD-10-CM

## 2019-04-29 DIAGNOSIS — Z0000 Encounter for general adult medical examination without abnormal findings: Secondary | ICD-10-CM

## 2019-04-29 DIAGNOSIS — E669 Obesity, unspecified: Secondary | ICD-10-CM

## 2019-04-29 MED ORDER — NICOTINE 7 MG/24HR TD PT24: MEDICATED_PATCH | TRANSDERMAL | 0 refills | Status: DC

## 2019-04-29 MED ORDER — DULOXETINE HCL 60 MG PO CPEP: 60.0000 mg | ORAL_CAPSULE | Freq: Every day | ORAL | 11 refills | Status: DC

## 2019-04-29 MED ORDER — NICOTINE 14 MG/24HR TD PT24: 14.0000 mg | MEDICATED_PATCH | Freq: Every day | TRANSDERMAL | 0 refills | Status: DC

## 2019-04-29 NOTE — Patient Instructions (Addendum)
Drink a glass of water before every meal Drink 6-8 glasses of water daily Eat three meals daily Eat a protein and healthy fat with every meal (eggs,fish, chicken, [place] and limit red meats) Eat 5 servings of vegetables daily, mix the colors Eat 2 servings of fruit daily with skin, if skin is edible Use smaller plates Put food/utensils down as you chew and swallow each bite Eat at a table with friends/family at least once daily, no TV Do not eat in front of the TV  Recent studies show that people who consume all of their calories in a 12 hour period lose weight more efficiently.  For example, if you eat your first meal at 7:00 a.m., your last meal of the day should be completed by 7:00 p.m.  Tobacco Cessation:   1800QUITNOW or [phone], the former for support and possibly free nicotine patches/gum and support; the latter for [facility] Smoking cessation class.  Get rid of all smoking supplies:  Cigarettes, lighters, ashtrays--no stashes just in case at home if you are serious.  Recommend nicotine patches:  14 mg to skin and change daily for 28 days, then down to 7 mg patches for 14 days, then stop. Get rid of all cigarettes, lighters, ash trays, smoking paraphernalia before start first patch
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
## 2019-05-02 LAB — CYTOLOGY - PAP

## 2019-05-13 ENCOUNTER — Telehealth: Payer: Self-pay | Admitting: *Deleted

## 2019-05-13 NOTE — Telephone Encounter (Signed)
LVM for patient requesting to return call to research nurse regarding changes to the AFT-05 [name] study and needing to move her next appointment up by a few weeks due to these changes.   Foye Spurling, BSN, RN Clinical Research Nurse 05/13/2019 10:56 AM

## 2019-05-13 NOTE — Telephone Encounter (Signed)
Patient returned call regarding the [name] study and visit.  Informed patient that the study has determined there is no benefit for participants to continue on the investigational drug Palbociclib.  They are moving all active patients on both arms of the study into the follow up phase. Study is requiring patients on her arm (observational) to have their End of Treatment visit in 30-42 days from date of notification. Patient will need to be scheduled to come in 7/9 thru 7/21.  Patient agreed to come in the week of July 13th for Lab and MD visit.  She also reminds nurse she needs to have injection appt for Prolia.  It is ordered every 24 weeks and it will be more than 24 weeks since last injection 12/17/18.  Informed patient that scheduling request will be sent to r/s her appts on 8/5 to week of 7/13 and patient requests mid morning appointment.  Informed patient she will be receiving a letter regarding the study and reason why it is ending early.  Thanked patient very much for her understanding and encouraged her to call if any questions or concerns before the next visit.  Foye Spurling, BSN, RN Clinical Research Nurse 05/13/2019 1:31 PM

## 2019-05-14 ENCOUNTER — Other Ambulatory Visit (INDEPENDENT_AMBULATORY_CARE_PROVIDER_SITE_OTHER): Payer: Medicaid Other

## 2019-05-14 ENCOUNTER — Other Ambulatory Visit: Payer: Self-pay

## 2019-05-14 DIAGNOSIS — Z0000 Encounter for general adult medical examination without abnormal findings: Secondary | ICD-10-CM

## 2019-05-14 DIAGNOSIS — Z1211 Encounter for screening for malignant neoplasm of colon: Secondary | ICD-10-CM | POA: Diagnosis not present

## 2019-05-14 LAB — POC HEMOCCULT BLD/STL (HOME/3-CARD/SCREEN)
Card #2 Fecal Occult Blod, POC: NEGATIVE
Card #3 Fecal Occult Blood, POC: NEGATIVE
Fecal Occult Blood, POC: NEGATIVE

## 2019-05-15 ENCOUNTER — Telehealth: Payer: Self-pay | Admitting: Oncology

## 2019-05-15 LAB — CBC WITH DIFFERENTIAL/PLATELET
Basophils Absolute: 0 10*3/uL (ref 0.0–0.2)
Basos: 0 %
EOS (ABSOLUTE): 0.1 10*3/uL (ref 0.0–0.4)
Eos: 3 %
Hematocrit: 45 % (ref 34.0–46.6)
Hemoglobin: 14.9 g/dL (ref 11.1–15.9)
Immature Grans (Abs): 0 10*3/uL (ref 0.0–0.1)
Immature Granulocytes: 0 %
Lymphocytes Absolute: 0.9 10*3/uL (ref 0.7–3.1)
Lymphs: 18 %
MCH: 33.7 pg — ABNORMAL HIGH (ref 26.6–33.0)
MCHC: 33.1 g/dL (ref 31.5–35.7)
MCV: 102 fL — ABNORMAL HIGH (ref 79–97)
Monocytes Absolute: 0.3 10*3/uL (ref 0.1–0.9)
Monocytes: 7 %
Neutrophils Absolute: 3.5 10*3/uL (ref 1.4–7.0)
Neutrophils: 72 %
Platelets: 262 10*3/uL (ref 150–450)
RBC: 4.42 x10E6/uL (ref 3.77–5.28)
RDW: 15.3 % (ref 11.7–15.4)
WBC: 4.9 10*3/uL (ref 3.4–10.8)

## 2019-05-15 LAB — COMPREHENSIVE METABOLIC PANEL
ALT: 12 IU/L (ref 0–32)
AST: 16 IU/L (ref 0–40)
Albumin/Globulin Ratio: 1.7 (ref 1.2–2.2)
Albumin: 4.1 g/dL (ref 3.8–4.9)
Alkaline Phosphatase: 79 IU/L (ref 39–117)
BUN/Creatinine Ratio: 18 (ref 9–23)
BUN: 13 mg/dL (ref 6–24)
Bilirubin Total: 0.3 mg/dL (ref 0.0–1.2)
CO2: 22 mmol/L (ref 20–29)
Calcium: 9.4 mg/dL (ref 8.7–10.2)
Chloride: 105 mmol/L (ref 96–106)
Creatinine, Ser: 0.73 mg/dL (ref 0.57–1.00)
GFR calc Af Amer: 106 mL/min/{1.73_m2} (ref >59)
GFR calc non Af Amer: 92 mL/min/{1.73_m2} (ref >59)
Globulin, Total: 2.4 g/dL (ref 1.5–4.5)
Glucose: 87 mg/dL (ref 65–99)
Potassium: 4.7 mmol/L (ref 3.5–5.2)
Sodium: 141 mmol/L (ref 134–144)
Total Protein: 6.5 g/dL (ref 6.0–8.5)

## 2019-05-15 LAB — LIPID PANEL W/O CHOL/HDL RATIO
Cholesterol, Total: 242 mg/dL — ABNORMAL HIGH (ref 100–199)
HDL: 58 mg/dL (ref >39)
LDL Calculated: 167 mg/dL — ABNORMAL HIGH (ref 0–99)
Triglycerides: 86 mg/dL (ref 0–149)
VLDL Cholesterol Cal: 17 mg/dL (ref 5–40)

## 2019-05-15 NOTE — Telephone Encounter (Signed)
Per 6/9 schedule message moved 8/5 appointments to 7/16. Confirmed with patient.

## 2019-05-16 ENCOUNTER — Other Ambulatory Visit: Payer: Medicaid Other

## 2019-05-21 ENCOUNTER — Telehealth: Payer: Self-pay | Admitting: Internal Medicine

## 2019-05-21 ENCOUNTER — Ambulatory Visit: Payer: Managed Care, Other (non HMO) | Admitting: Podiatry

## 2019-05-21 NOTE — Telephone Encounter (Signed)
To Dr. Mulberry for further direction 

## 2019-05-21 NOTE — Telephone Encounter (Signed)
Patient called requesting a call from Dr. Amil Amen to discuss her lab results from 05/14/19.Marland Kitchen patient states noticed MCD, [facility] and Cholesterol are elevated. Patient also wants Dr. Amil Amen to knows her mother died from [place] last week.  Please advise.

## 2019-05-26 ENCOUNTER — Other Ambulatory Visit: Payer: Self-pay | Admitting: *Deleted

## 2019-05-26 DIAGNOSIS — C50411 Malignant neoplasm of upper-outer quadrant of right female breast: Secondary | ICD-10-CM

## 2019-05-28 ENCOUNTER — Other Ambulatory Visit: Payer: Self-pay | Admitting: Podiatry

## 2019-05-28 ENCOUNTER — Ambulatory Visit: Payer: Managed Care, Other (non HMO) | Admitting: Podiatry

## 2019-05-28 ENCOUNTER — Other Ambulatory Visit: Payer: Self-pay

## 2019-05-28 ENCOUNTER — Other Ambulatory Visit: Payer: Self-pay | Admitting: Oncology

## 2019-05-28 ENCOUNTER — Encounter: Payer: Self-pay | Admitting: Podiatry

## 2019-05-28 ENCOUNTER — Ambulatory Visit (INDEPENDENT_AMBULATORY_CARE_PROVIDER_SITE_OTHER): Payer: Medicaid Other

## 2019-05-28 VITALS — BP 124/82 | HR 84 | Temp 97.1°F

## 2019-05-28 DIAGNOSIS — M722 Plantar fascial fibromatosis: Secondary | ICD-10-CM | POA: Diagnosis not present

## 2019-05-28 DIAGNOSIS — Z9889 Other specified postprocedural states: Secondary | ICD-10-CM

## 2019-05-28 DIAGNOSIS — M779 Enthesopathy, unspecified: Secondary | ICD-10-CM

## 2019-05-28 MED ORDER — MELOXICAM 15 MG PO TABS: 15.0000 mg | ORAL_TABLET | Freq: Every day | ORAL | 1 refills | Status: DC

## 2019-05-28 MED ORDER — METHYLPREDNISOLONE 4 MG PO TBPK: ORAL_TABLET | ORAL | 0 refills | Status: DC

## 2019-05-28 NOTE — Telephone Encounter (Signed)
Called and spoke with patient last Friday, 05/23/2019.  Forgot to place a note.   Mother was in a nursing home having significant COVID19 illness.
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
## 2019-06-18 ENCOUNTER — Telehealth: Payer: Self-pay | Admitting: *Deleted

## 2019-06-18 ENCOUNTER — Encounter: Payer: Self-pay | Admitting: Internal Medicine

## 2019-06-18 ENCOUNTER — Other Ambulatory Visit: Payer: Self-pay

## 2019-06-18 ENCOUNTER — Ambulatory Visit: Payer: Medicaid Other | Admitting: Internal Medicine

## 2019-06-18 VITALS — BP 122/82 | HR 86 | Resp 12 | Ht 63.0 in | Wt 220.0 lb

## 2019-06-18 DIAGNOSIS — L853 Xerosis cutis: Secondary | ICD-10-CM | POA: Diagnosis not present

## 2019-06-18 DIAGNOSIS — R87615 Unsatisfactory cytologic smear of cervix: Secondary | ICD-10-CM

## 2019-06-18 DIAGNOSIS — I83893 Varicose veins of bilateral lower extremities with other complications: Secondary | ICD-10-CM | POA: Diagnosis not present

## 2019-06-18 MED ORDER — VALACYCLOVIR HCL 1 G PO TABS: ORAL_TABLET | ORAL | 11 refills | Status: DC

## 2019-06-18 NOTE — Telephone Encounter (Signed)
  LVM for patient confirming her appointments for tomorrow.  Asked her to call back if she cannot make this appointment and also if she has time to review her medication list by phone today. Otherwise will plan to see her in the morning at 9:45 am.  Foye Spurling, BSN, RN Clinical Research Nurse 06/18/2019 9:38 AM
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
## 2019-06-18 NOTE — Progress Notes (Signed)
    Subjective:    Patient ID: April [name], female   DOB: 05/16/1961, 58 y.o.   MRN: [id number]   HPI   1.  Here for repeat pap as previous pap with insufficient cells.  She was also noted to have significant atrophy of vaginal and cervical mucosa.  2.  Bilateral leg pain:  Worsening.  Aching pain.  Does seem to be more prominent in areas where varicosities more prominent.    2.  Current Meds  Medication Sig  . b complex vitamins tablet Take 1 tablet by mouth daily.  . Calcium Carb-Cholecalciferol (CALCIUM/VITAMIN D) 600-400 MG-UNIT TABS Take 1 tablet by mouth 2 (two) times a day. Reports starting January 2018   . cetirizine (ZYRTEC) 10 MG tablet Take 10 mg by mouth daily.  Marland Kitchen denosumab (PROLIA) 60 MG/ML SOSY injection Inject 60 mg into the skin every 6 (six) months.  . DULoxetine (CYMBALTA) 60 MG capsule Take 1 capsule (60 mg total) by mouth daily.  Marland Kitchen ECHINACEA PO Take 2 capsules by mouth 2 (two) times daily as needed (Only takes when she is getting a cold).   Marland Kitchen esomeprazole (NEXIUM) 20 MG capsule Take 20 mg by mouth daily at 12 noon.  Marland Kitchen exemestane (AROMASIN) 25 MG tablet TAKE 1 TABLET BY MOUTH ONCE DAILY AFTER BREAKFAST  . ibuprofen (ADVIL,MOTRIN) 200 MG tablet Take 600 mg by mouth as needed for headache or moderate pain.  . meloxicam (MOBIC) 15 MG tablet Take 1 tablet (15 mg total) by mouth daily.  . valACYclovir (VALTREX) 1000 MG tablet 1/2 tab by mouth twice daily   Allergies  Allergen Reactions  . Hydrocodone Nausea Only and Other (See Comments)    dizziness  . Ultram [Tramadol Hcl] Nausea Only  . Gabapentin Rash     Review of Systems    Objective:   BP 122/82 (BP Location: Left Arm, Patient Position: Sitting, Cuff Size: Large)   Pulse 86   Resp 12   Ht 5\' 3"  (1.6 m)   Wt 220 lb (99.8 kg)   BMI 38.97 kg/m   Physical Exam  GU:  Atrophy of cervical and vaginal mucosa.  Pap taken.  No discharge. Extremely dry skin.  Multiple areas of bruising about superficial  spider veins.  Varicosities of bilateral lower legs--more prominent in thighs with spider veins throughout legs  Assessment & Plan  1.  Repeat pap for inadequate sampling at CPE  2.  Varicose veins:  Ames Walker graduated compression thigh high stockings Leg elevation LE venous studies to ascertain if may be a candidate in near future for interventional radiology/laser treatment.  3.  Dry skin:  Encouraged regular use of Eucerin Cream for eczema relief twice daily, particularly after showering.  4.  Hx of Genital herpes:  Refill Valtrex for HSV suppression. Keep appt in august

## 2019-06-19 ENCOUNTER — Other Ambulatory Visit: Payer: Self-pay

## 2019-06-19 ENCOUNTER — Inpatient Hospital Stay: Payer: Medicaid Other | Attending: Adult Health

## 2019-06-19 ENCOUNTER — Inpatient Hospital Stay: Payer: Medicaid Other

## 2019-06-19 ENCOUNTER — Inpatient Hospital Stay (HOSPITAL_BASED_OUTPATIENT_CLINIC_OR_DEPARTMENT_OTHER): Payer: Medicaid Other | Admitting: Oncology

## 2019-06-19 ENCOUNTER — Encounter: Payer: Self-pay | Admitting: *Deleted

## 2019-06-19 ENCOUNTER — Encounter: Payer: Self-pay | Admitting: Oncology

## 2019-06-19 VITALS — BP 119/79 | HR 89 | Temp 98.3°F | Resp 17 | Ht 63.0 in | Wt 225.8 lb

## 2019-06-19 DIAGNOSIS — M810 Age-related osteoporosis without current pathological fracture: Secondary | ICD-10-CM | POA: Diagnosis not present

## 2019-06-19 DIAGNOSIS — C50919 Malignant neoplasm of unspecified site of unspecified female breast: Secondary | ICD-10-CM

## 2019-06-19 DIAGNOSIS — Z006 Encounter for examination for normal comparison and control in clinical research program: Secondary | ICD-10-CM | POA: Insufficient documentation

## 2019-06-19 DIAGNOSIS — Z170 Estrogen receptor positive status [ER+]: Secondary | ICD-10-CM | POA: Insufficient documentation

## 2019-06-19 DIAGNOSIS — Z79811 Long term (current) use of aromatase inhibitors: Secondary | ICD-10-CM | POA: Diagnosis not present

## 2019-06-19 DIAGNOSIS — C50411 Malignant neoplasm of upper-outer quadrant of right female breast: Secondary | ICD-10-CM

## 2019-06-19 DIAGNOSIS — C773 Secondary and unspecified malignant neoplasm of axilla and upper limb lymph nodes: Secondary | ICD-10-CM

## 2019-06-19 DIAGNOSIS — Z923 Personal history of irradiation: Secondary | ICD-10-CM | POA: Insufficient documentation

## 2019-06-19 DIAGNOSIS — F17210 Nicotine dependence, cigarettes, uncomplicated: Secondary | ICD-10-CM | POA: Diagnosis not present

## 2019-06-19 DIAGNOSIS — M722 Plantar fascial fibromatosis: Secondary | ICD-10-CM | POA: Insufficient documentation

## 2019-06-19 DIAGNOSIS — M818 Other osteoporosis without current pathological fracture: Secondary | ICD-10-CM

## 2019-06-19 DIAGNOSIS — Z9221 Personal history of antineoplastic chemotherapy: Secondary | ICD-10-CM | POA: Insufficient documentation

## 2019-06-19 DIAGNOSIS — Z7289 Other problems related to lifestyle: Secondary | ICD-10-CM | POA: Diagnosis not present

## 2019-06-19 LAB — CBC WITH DIFFERENTIAL/PLATELET
Abs Immature Granulocytes: 0.02 10*3/uL (ref 0.00–0.07)
Basophils Absolute: 0 10*3/uL (ref 0.0–0.1)
Basophils Relative: 0 %
Eosinophils Absolute: 0.1 10*3/uL (ref 0.0–0.5)
Eosinophils Relative: 2 %
HCT: 41.7 % (ref 36.0–46.0)
Hemoglobin: 13.8 g/dL (ref 12.0–15.0)
Immature Granulocytes: 0 %
Lymphocytes Relative: 12 %
Lymphs Abs: 0.7 10*3/uL (ref 0.7–4.0)
MCH: 33.5 pg (ref 26.0–34.0)
MCHC: 33.1 g/dL (ref 30.0–36.0)
MCV: 101.2 fL — ABNORMAL HIGH (ref 80.0–100.0)
Monocytes Absolute: 0.4 10*3/uL (ref 0.1–1.0)
Monocytes Relative: 8 %
Neutro Abs: 4.4 10*3/uL (ref 1.7–7.7)
Neutrophils Relative %: 78 %
Platelets: 239 10*3/uL (ref 150–400)
RBC: 4.12 MIL/uL (ref 3.87–5.11)
RDW: 14.8 % (ref 11.5–15.5)
WBC: 5.7 10*3/uL (ref 4.0–10.5)
nRBC: 0 % (ref 0.0–0.2)

## 2019-06-19 LAB — CMP (CANCER CENTER ONLY)
ALT: 15 U/L (ref 0–44)
AST: 17 U/L (ref 15–41)
Albumin: 3.3 g/dL — ABNORMAL LOW (ref 3.5–5.0)
Alkaline Phosphatase: 81 U/L (ref 38–126)
Anion gap: 8 (ref 5–15)
BUN: 15 mg/dL (ref 6–20)
CO2: 24 mmol/L (ref 22–32)
Calcium: 8.7 mg/dL — ABNORMAL LOW (ref 8.9–10.3)
Chloride: 107 mmol/L (ref 98–111)
Creatinine: 0.79 mg/dL (ref 0.44–1.00)
GFR, Est AFR Am: >60 mL/min (ref >60)
GFR, Estimated: >60 mL/min (ref >60)
Glucose, Bld: 86 mg/dL (ref 70–99)
Potassium: 3.9 mmol/L (ref 3.5–5.1)
Sodium: 139 mmol/L (ref 135–145)
Total Bilirubin: 0.3 mg/dL (ref 0.3–1.2)
Total Protein: 6.5 g/dL (ref 6.5–8.1)

## 2019-06-19 LAB — HEMOGLOBIN A1C
Hgb A1c MFr Bld: 5.5 % (ref 4.8–5.6)
Mean Plasma Glucose: 111.15 mg/dL

## 2019-06-19 LAB — RESEARCH LABS

## 2019-06-19 MED ORDER — DENOSUMAB 60 MG/ML ~~LOC~~ SOSY
PREFILLED_SYRINGE | SUBCUTANEOUS | Status: AC
  Filled 2019-06-19: qty 1

## 2019-06-19 MED ORDER — DENOSUMAB 60 MG/ML ~~LOC~~ SOSY
60.0000 mg | PREFILLED_SYRINGE | Freq: Once | SUBCUTANEOUS | Status: AC
  Administered 2019-06-19: 60 mg via SUBCUTANEOUS

## 2019-06-19 MED ORDER — EXEMESTANE 25 MG PO TABS: 25.0000 mg | ORAL_TABLET | Freq: Every day | ORAL | 4 refills | Status: DC

## 2019-06-19 NOTE — Patient Instructions (Signed)

## 2019-06-19 NOTE — Research (Signed)
[name] AFT-05 End of Treatment Visit Assessment; Patient into the clinic by herself to complete study activities for EOT phase on the [name] study.  Lab:  CBC, CMET, Hgb A1C and Research Labs collected per protocol.  CBC and CMET reviewed by Dr. Jana Hakim. He states no clinically significant abnormal lab values.  VS:  Collected per protocol.  History and Physical, Performance Status:  Completed by Dr. Jana Hakim.  See office encounter notes from today.  AE Review:  See AE table below. Flu-like symptoms from 02/27/19 resolved completely by 03/17/19.  Plantar Fascitis has had this for "many years" prior to her breast cancer diagnosis but it has gotten worse in the past month leading her to see a specialist on 05/28/19.  She received a steroid injection into her foot and reports the pain was gone the next day.  She also completed a Medrol dose pack and is taking Meloxicam daily.  States the pain/symptoms have not returned. She reports left knee pain returned and she is continuing Meloxicam daily for her knee although it was prescribed for her foot.  Patient saw PCP yesterday who noted some swelling and bruising in right leg which patient was told is from her varicose veins bursting.  Venous US and compression stockings ordered but have not started yet.   Patient has not been tested for Covid19 since reported at last visit.  Con Meds:  Reviewed with patient and medication list updated.   Drug Diaries:  Patient returned medication diaries for Exemestane for cycles 24 thru 26. She states she did not miss any doses and this is reflected in her diaries.   Adverse Events Review  Event Grade Onset Date Resolved Date Status Attribution to Exemestane Treatment Comments   Breast Pain (R)-int  1  07/03/17    ongoing  No  None Occasional- Sharp pain-brief  Arthritis-both great toes 1 08/13/17   ongoing No None   Fatigue 1 01/31/18  ongoing Yes  None   Headache  1 04/08/18  ongoing  No Tylenol& Ibuprofen prn Intermittent  Joint disorder De Quervain's Tenosynovitis  1 09/24/18  ongoing No Rest, splint per PCP    Peripheral Neuropathy 2 08/22/17  ongoing No Lyrica stopped per patient   Paresthesia in arms: Carpal Tunnel Syndrome 2 05/15/18  ongoing No Spica and cock splint ordered by PCP   Alopecia-mild hair thinning 1 07/01/18  ongoing Yes None   Vaginal Dryness 1 09/03/18  ongoing Yes None   Flu like symptoms 2 02/27/19 03/17/19 resolved No Albuterol, Azithromax Pneumonia. Flu & Covid19 tests Neg.  Plantar Fasciitis 2 05/05/19 05/29/19 resolved No Steroid injection, medrol dose pack and Meloxicam Grade 1 at baseline for "many years" prior to Breast Cancer diagnosis.   Left knee pain 2 04/29/19  ongoing No Meloxicam Recurrent  Varicose Veins with swelling LE 2 06/18/19  ongoing No PCP ordered US of LE and compression stockings- both are pending    Thanked patient for completing the treatment phase of this study.  Informed her of Follow Up phase every 6 months and next visit is due in January 2021.  Encouraged patient to call research nurse if any questions or concerns prior to next contact. She verbalized understanding.  Foye Spurling, BSN, RN Clinical Research Nurse 06/19/2019

## 2019-06-20 LAB — CYTOLOGY - PAP

## 2019-06-25 ENCOUNTER — Other Ambulatory Visit (INDEPENDENT_AMBULATORY_CARE_PROVIDER_SITE_OTHER): Payer: Medicaid Other

## 2019-06-25 ENCOUNTER — Other Ambulatory Visit: Payer: Self-pay

## 2019-06-25 ENCOUNTER — Ambulatory Visit: Payer: Medicaid Other | Admitting: Podiatry

## 2019-06-25 ENCOUNTER — Encounter: Payer: Self-pay | Admitting: Podiatry

## 2019-06-25 VITALS — Temp 97.9°F

## 2019-06-25 DIAGNOSIS — M722 Plantar fascial fibromatosis: Secondary | ICD-10-CM | POA: Diagnosis not present

## 2019-06-25 DIAGNOSIS — R718 Other abnormality of red blood cells: Secondary | ICD-10-CM | POA: Diagnosis not present

## 2019-06-27 LAB — SPECIMEN STATUS REPORT

## 2019-06-30 ENCOUNTER — Telehealth: Payer: Self-pay

## 2019-06-30 ENCOUNTER — Ambulatory Visit (HOSPITAL_COMMUNITY)
Admission: RE | Admit: 2019-06-30 | Discharge: 2019-06-30 | Disposition: A | Discharge status: Home / Self Care | Payer: Medicaid Other | Source: Ambulatory Visit | Attending: Internal Medicine | Admitting: Internal Medicine

## 2019-06-30 ENCOUNTER — Telehealth: Payer: Self-pay | Admitting: Internal Medicine

## 2019-06-30 ENCOUNTER — Other Ambulatory Visit: Payer: Self-pay

## 2019-06-30 DIAGNOSIS — I83893 Varicose veins of bilateral lower extremities with other complications: Secondary | ICD-10-CM | POA: Insufficient documentation

## 2019-06-30 NOTE — Telephone Encounter (Signed)
Patient called stating was in tears last night crying due to really bad leg pain; "I am  not kidding when I say is a really bad strong pain" Patient states has been prescribed with Meloxicam and is not helping at all. Patient requesting something different or stronger can help for the pain.  Please advise.

## 2019-06-30 NOTE — Telephone Encounter (Signed)
To Dr. Amil Amen for further direction. Patient had doppler done on veins today. Looks like preliminary report is in her chart.

## 2019-06-30 NOTE — Telephone Encounter (Signed)
Left message for patient to call back regarding leg pain and what needs to be done.

## 2019-07-02 ENCOUNTER — Other Ambulatory Visit: Payer: Medicaid Other

## 2019-07-02 ENCOUNTER — Ambulatory Visit: Payer: Medicaid Other | Admitting: Oncology

## 2019-07-02 ENCOUNTER — Ambulatory Visit: Payer: Medicaid Other
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
## 2019-07-04 LAB — FOLATE RBC: Hematocrit: 38.7 % (ref 34.0–46.6)

## 2019-07-04 LAB — TSH: TSH: 2.49 u[IU]/mL (ref 0.450–4.500)

## 2019-07-04 LAB — VITAMIN B12: Vitamin B-12: 268 pg/mL (ref 232–1245)

## 2019-07-09 ENCOUNTER — Ambulatory Visit: Payer: Medicaid Other | Admitting: Oncology

## 2019-07-09 ENCOUNTER — Other Ambulatory Visit: Payer: Medicaid Other

## 2019-07-09 ENCOUNTER — Ambulatory Visit: Payer: Medicaid Other

## 2019-07-27 DIAGNOSIS — I83893 Varicose veins of bilateral lower extremities with other complications: Secondary | ICD-10-CM | POA: Insufficient documentation

## 2019-07-27 DIAGNOSIS — L853 Xerosis cutis: Secondary | ICD-10-CM | POA: Insufficient documentation

## 2019-07-28 ENCOUNTER — Other Ambulatory Visit: Payer: Self-pay

## 2019-07-28 ENCOUNTER — Encounter: Payer: Self-pay | Admitting: Podiatry

## 2019-07-28 ENCOUNTER — Ambulatory Visit: Payer: Medicaid Other | Admitting: Podiatry

## 2019-07-28 VITALS — Temp 98.0°F

## 2019-07-28 DIAGNOSIS — M722 Plantar fascial fibromatosis: Secondary | ICD-10-CM | POA: Diagnosis not present

## 2019-07-29 ENCOUNTER — Ambulatory Visit: Payer: Medicaid Other | Admitting: Internal Medicine

## 2019-07-29 ENCOUNTER — Encounter: Payer: Self-pay | Admitting: Internal Medicine

## 2019-07-29 VITALS — BP 136/80 | HR 78 | Resp 12 | Ht 63.0 in | Wt 217.0 lb

## 2019-07-29 DIAGNOSIS — D7589 Other specified diseases of blood and blood-forming organs: Secondary | ICD-10-CM | POA: Diagnosis not present

## 2019-07-29 DIAGNOSIS — I83893 Varicose veins of bilateral lower extremities with other complications: Secondary | ICD-10-CM

## 2019-07-29 NOTE — Progress Notes (Signed)
    Subjective:    Patient ID: April [name], female   DOB: 18-Jun-1961, 58 y.o.   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  Doppler studies confirmed.  Her left leg pain about her knee is much better with elevation mainly. She did wear compression stockings, but was very hot and holding on that until cooler months.  Macrocytosis:  She is taking probiotics, 123456 with B6, folic acid, Calcium, D.  To get RBC folate drawn again as lost by lab.  Current Meds  Medication Sig  . b complex vitamins tablet Take 1 tablet by mouth daily.  Marland Kitchen CALCIUM-VITAMIN D-VITAMIN K PO Take by mouth. 1 daily  . cetirizine (ZYRTEC) 10 MG tablet Take 10 mg by mouth daily.  Marland Kitchen denosumab (PROLIA) 60 MG/ML SOSY injection Inject 60 mg into the skin every 6 (six) months.  . DULoxetine (CYMBALTA) 60 MG capsule Take 1 capsule (60 mg total) by mouth daily.  Marland Kitchen ECHINACEA PO Take 2 capsules by mouth 2 (two) times daily as needed (Only takes when she is getting a cold).   Marland Kitchen exemestane (AROMASIN) 25 MG tablet Take 1 tablet (25 mg total) by mouth daily after breakfast.  . ibuprofen (ADVIL,MOTRIN) 200 MG tablet Take 600 mg by mouth as needed for headache or moderate pain.  . meloxicam (MOBIC) 15 MG tablet Take 1 tablet (15 mg total) by mouth daily.  Marland Kitchen omeprazole (PRILOSEC) 20 MG capsule Take 20 mg by mouth daily.  . Probiotic Product (PROBIOTIC DAILY PO) Take by mouth. 1 daily  . valACYclovir (VALTREX) 1000 MG tablet 1/2 tab by mouth twice daily  . vitamin B-12 (CYANOCOBALAMIN) 1000 MCG tablet Take 1,000 mcg by mouth daily.   Allergies  Allergen Reactions  . Hydrocodone Nausea Only and Other (See Comments)    dizziness  . Ultram [Tramadol Hcl] Nausea Only  . Gabapentin Rash     Review of Systems    Objective:   BP 136/80 (BP Location: Left Arm, Patient Position: Sitting, Cuff Size: Large)   Pulse 78   Resp 12   Ht 5\' 3"  (1.6 m)   Wt 217 lb (98.4 kg)   BMI 38.44 kg/m   Physical Exam NAD Lungs:  CTA  CV:  RRR without murmur or rub.  Radial pulses normal and equal. LE:  No edema.  Assessment & Plan   1.  Varicose veins:  Venous doppler supported.  Improved with elevation mainly.  Will add compression stockings during cooler months.  2.  Macrocytosis:  Taking B12/B complex at adequate dosing.  RBC folate today.  3.  Brings up episode when mowing lawn with sweating, generalized weakness tachypalpitations, light headedness.  Had only had coffee to drink prior.   Lasted in total about 30 minutes.  Drank water and lay down with legs elevated, but did not take pulse or eat anything. Sounds like hypoglycemic or heat related event and not panic attack as she was assuming. Encouraged drinking water to prevent dehydration when out working in sun. Also to not skip meals and eating something like peanut butter should this happen again. Also--check pulse if occurs again.

## 2019-07-30 ENCOUNTER — Other Ambulatory Visit: Payer: Self-pay

## 2019-07-30 ENCOUNTER — Ambulatory Visit
Admission: RE | Admit: 2019-07-30 | Discharge: 2019-07-30 | Disposition: A | Discharge status: Home / Self Care | Payer: Medicaid Other | Source: Ambulatory Visit | Attending: Oncology | Admitting: Oncology

## 2019-07-30 DIAGNOSIS — Z9889 Other specified postprocedural states: Secondary | ICD-10-CM
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
## 2019-07-31 LAB — FOLATE RBC
Folate, Hemolysate: 423 ng/mL
Folate, RBC: 993 ng/mL (ref >498)
Hematocrit: 42.6 % (ref 34.0–46.6)

## 2019-08-13 ENCOUNTER — Other Ambulatory Visit: Payer: Self-pay

## 2019-08-13 ENCOUNTER — Ambulatory Visit: Payer: Medicaid Other | Admitting: Podiatry

## 2019-08-13 DIAGNOSIS — M722 Plantar fascial fibromatosis: Secondary | ICD-10-CM | POA: Diagnosis not present
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
## 2019-08-22 ENCOUNTER — Other Ambulatory Visit (INDEPENDENT_AMBULATORY_CARE_PROVIDER_SITE_OTHER): Payer: Medicaid Other

## 2019-08-22 ENCOUNTER — Other Ambulatory Visit: Payer: Self-pay

## 2019-08-22 DIAGNOSIS — D7589 Other specified diseases of blood and blood-forming organs: Secondary | ICD-10-CM

## 2019-08-22 DIAGNOSIS — Z13220 Encounter for screening for lipoid disorders: Secondary | ICD-10-CM

## 2019-08-23 LAB — CBC WITH DIFFERENTIAL/PLATELET
Basophils Absolute: 0 10*3/uL (ref 0.0–0.2)
Basos: 0 %
EOS (ABSOLUTE): 0.1 10*3/uL (ref 0.0–0.4)
Eos: 3 %
Hematocrit: 41.7 % (ref 34.0–46.6)
Hemoglobin: 14.6 g/dL (ref 11.1–15.9)
Immature Grans (Abs): 0 10*3/uL (ref 0.0–0.1)
Immature Granulocytes: 0 %
Lymphocytes Absolute: 0.8 10*3/uL (ref 0.7–3.1)
Lymphs: 17 %
MCH: 35.1 pg — ABNORMAL HIGH (ref 26.6–33.0)
MCHC: 35 g/dL (ref 31.5–35.7)
MCV: 100 fL — ABNORMAL HIGH (ref 79–97)
Monocytes Absolute: 0.3 10*3/uL (ref 0.1–0.9)
Monocytes: 7 %
Neutrophils Absolute: 3.5 10*3/uL (ref 1.4–7.0)
Neutrophils: 73 %
Platelets: 246 10*3/uL (ref 150–450)
RBC: 4.16 x10E6/uL (ref 3.77–5.28)
RDW: 13.5 % (ref 11.7–15.4)
WBC: 4.7 10*3/uL (ref 3.4–10.8)

## 2019-08-23 LAB — LIPID PANEL W/O CHOL/HDL RATIO
Cholesterol, Total: 248 mg/dL — ABNORMAL HIGH (ref 100–199)
HDL: 65 mg/dL (ref >39)
LDL Chol Calc (NIH): 170 mg/dL — ABNORMAL HIGH (ref 0–99)
Triglycerides: 75 mg/dL (ref 0–149)
VLDL Cholesterol Cal: 13 mg/dL (ref 5–40)

## 2019-08-25 ENCOUNTER — Ambulatory Visit: Payer: Medicaid Other | Admitting: Podiatry

## 2019-08-28 ENCOUNTER — Encounter (HOSPITAL_COMMUNITY): Payer: Self-pay | Admitting: *Deleted

## 2019-09-08 ENCOUNTER — Ambulatory Visit: Payer: Medicaid Other | Admitting: Podiatry

## 2019-10-01 ENCOUNTER — Ambulatory Visit: Payer: Medicaid Other | Admitting: Podiatry

## 2019-10-28 ENCOUNTER — Other Ambulatory Visit: Payer: Medicaid Other

## 2019-11-06 ENCOUNTER — Other Ambulatory Visit: Payer: Medicaid Other

## 2019-12-05 HISTORY — PX: PORTA CATH INSERTION: CATH118285

## 2019-12-16 ENCOUNTER — Telehealth: Payer: Self-pay | Admitting: *Deleted

## 2019-12-16 NOTE — Telephone Encounter (Signed)
[name] Study 30 Month Follow Up;  Left VM for patient informing her of 30 month follow up visit due for the [name] study.  Informed patient that research nurse can collect information for this visit over the phone. I will be out of the office on Monday 12/22/19 when she comes into clinic to see Wilber Bihari.  Asked patient to please call research nurse back when she has a chance today or tomorrow.  Otherwise I will plan to call her again next week when I return to the office on Tuesday.   Foye Spurling, BSN, RN Clinical Research Nurse 12/16/2019 10:00 AM

## 2019-12-22 ENCOUNTER — Inpatient Hospital Stay: Payer: Medicaid Other | Attending: Oncology

## 2019-12-22 ENCOUNTER — Inpatient Hospital Stay: Payer: Medicaid Other | Admitting: Adult Health

## 2019-12-22 ENCOUNTER — Telehealth: Payer: Self-pay | Admitting: *Deleted

## 2019-12-22 ENCOUNTER — Inpatient Hospital Stay: Payer: Medicaid Other

## 2019-12-22 ENCOUNTER — Encounter: Payer: Self-pay | Admitting: *Deleted

## 2019-12-22 NOTE — Telephone Encounter (Signed)
Called patient to inqurie No Show to her appt today. No voicemail. Mailed no show letter.
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
## 2019-12-24 ENCOUNTER — Telehealth: Payer: Self-pay | Admitting: *Deleted

## 2019-12-24 NOTE — Telephone Encounter (Signed)
[name] Study 30 Month Follow Up;  Left VM for patient informing her of 30 month follow up visit due for the [name] study.  Informed patient that research nurse can collect information for this visit over the phone.  Asked patient to please call research nurse back when she has a chance.  Otherwise I will plan to call her again in a few days.   Foye Spurling, BSN, RN Clinical Research Nurse 12/24/2019 10:00 AM

## 2019-12-26 ENCOUNTER — Telehealth: Payer: Self-pay | Admitting: *Deleted

## 2019-12-26 NOTE — Telephone Encounter (Signed)
[name] Study 30 Month Follow Up;  Left VM for patient informing her of 30 month follow up visit due for the [name] study.  Informed patient that research nurse can collect information for this visit over the phone.  Asked patient to please call research nurse back when she has a chance.  Otherwise I will plan to call her again in a few days.   Foye Spurling, BSN, RN Clinical Research Nurse 12/26/2019 4:44 PM

## 2019-12-30 ENCOUNTER — Encounter: Payer: Self-pay | Admitting: *Deleted

## 2020-01-06 ENCOUNTER — Telehealth: Payer: Self-pay | Admitting: *Deleted

## 2020-01-06 NOTE — Telephone Encounter (Signed)
[name] STUDY 30 months follow up assessment;  Patient returned call to research nurse to complete follow up study assessment by phone. Patient states she is continuing to take exemestane as prescribed. She has not taken any other breast or non-breast anti-cancer related therapy.  Patient had a mammogram last August which did not show any evidence of cancer. Thanked patient for her time today and informed the next study required visit is in July and scheduled for 06/21/20.  Patient will need to come into clinic to be seen by provider for this visit. Patient agreed and she states she wants to reschedule her appointment with Wilber Bihari, NP that she missed several weeks ago.  Informed patient I will send a message to Mendel Ryder to reschedule the missed visit.  She verbalized understanding.  Patient also reported she determined the ongoing rash on the back of her arms could be caused by Cymbalta.  Patient says she has self tapered herself almost completely off the Cymbalta now.  And although she reports the rash is improved, she feels she is suffering from effects of medication withdrawal such as headaches, body aches and mental fog. Patient states she is determined to stop taking Cymbalta due to the rash being so uncomfortable and "embarrassing." Patient states she is also feeling depressed and thinks she needs another type of anti-depressant to replace the Cymbalta.  Encouraged patient to see her PCP as soon as possible and let her help her with this.  Patient agreed and stated she was going to call Dr. Amil Amen as soon as she got off the phone with research nurse.  Foye Spurling, BSN, RN Clinical Research Nurse 01/06/2020 2:24 PM

## 2020-01-20 ENCOUNTER — Telehealth: Payer: Self-pay | Admitting: Adult Health

## 2020-01-20 ENCOUNTER — Telehealth: Payer: Self-pay | Admitting: *Deleted

## 2020-01-20 NOTE — Telephone Encounter (Signed)
Pt called and stated that she hasn't been feeling well for a week. Pt feels it's not related to Covid but feels horrible. Advised pt to go to [facility]'s website to schedule appt for covid testing or to call PCP for appt. Advised pt to call back with results to ensure proper scheduling for labs and injection. Pt verbalized understanding.

## 2020-01-20 NOTE — Telephone Encounter (Signed)
I TALK with patient regarding video visit °

## 2020-01-21 ENCOUNTER — Encounter: Payer: Self-pay | Admitting: Adult Health

## 2020-01-21 ENCOUNTER — Ambulatory Visit: Payer: Medicaid Other | Attending: Internal Medicine

## 2020-01-21 ENCOUNTER — Inpatient Hospital Stay: Payer: Medicaid Other

## 2020-01-21 ENCOUNTER — Inpatient Hospital Stay: Payer: Medicaid Other | Attending: Oncology | Admitting: Adult Health

## 2020-01-21 ENCOUNTER — Other Ambulatory Visit: Payer: Self-pay

## 2020-01-21 DIAGNOSIS — E2839 Other primary ovarian failure: Secondary | ICD-10-CM

## 2020-01-21 DIAGNOSIS — C50919 Malignant neoplasm of unspecified site of unspecified female breast: Secondary | ICD-10-CM

## 2020-01-21 DIAGNOSIS — Z20822 Contact with and (suspected) exposure to covid-19: Secondary | ICD-10-CM

## 2020-01-21 DIAGNOSIS — C773 Secondary and unspecified malignant neoplasm of axilla and upper limb lymph nodes: Secondary | ICD-10-CM
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
## 2020-01-22 LAB — NOVEL CORONAVIRUS, NAA: SARS-CoV-2, NAA: NOT DETECTED

## 2020-01-26 ENCOUNTER — Encounter: Payer: Self-pay | Admitting: Internal Medicine

## 2020-01-29 NOTE — Telephone Encounter (Signed)
Called patient. No answer. Left message for patient to call back.

## 2020-01-30 ENCOUNTER — Encounter: Payer: Self-pay | Admitting: Internal Medicine

## 2020-01-30 ENCOUNTER — Ambulatory Visit (INDEPENDENT_AMBULATORY_CARE_PROVIDER_SITE_OTHER): Payer: Medicaid Other | Admitting: Internal Medicine

## 2020-01-30 VITALS — BP 128/88 | HR 82 | Resp 12 | Ht 63.0 in | Wt 225.0 lb

## 2020-01-30 DIAGNOSIS — R21 Rash and other nonspecific skin eruption: Secondary | ICD-10-CM

## 2020-01-30 DIAGNOSIS — M797 Fibromyalgia: Secondary | ICD-10-CM | POA: Diagnosis not present

## 2020-01-30 DIAGNOSIS — G620 Drug-induced polyneuropathy: Secondary | ICD-10-CM

## 2020-01-30 DIAGNOSIS — F329 Major depressive disorder, single episode, unspecified: Secondary | ICD-10-CM | POA: Diagnosis not present

## 2020-01-30 DIAGNOSIS — J209 Acute bronchitis, unspecified: Secondary | ICD-10-CM | POA: Diagnosis not present

## 2020-01-30 DIAGNOSIS — F32A Depression, unspecified: Secondary | ICD-10-CM

## 2020-01-30 DIAGNOSIS — J0140 Acute pansinusitis, unspecified: Secondary | ICD-10-CM

## 2020-01-30 LAB — POCT RAPID STREP A (OFFICE): Rapid Strep A Screen: NEGATIVE

## 2020-01-30 MED ORDER — BUPROPION HCL ER (SR) 150 MG PO TB12: ORAL_TABLET | ORAL | 2 refills | Status: DC

## 2020-01-30 MED ORDER — CLONAZEPAM 1 MG PO TABS: 1.0000 mg | ORAL_TABLET | Freq: Two times a day (BID) | ORAL | 0 refills | Status: DC | PRN

## 2020-01-30 MED ORDER — AZITHROMYCIN 500 MG PO TABS: ORAL_TABLET | ORAL | 0 refills | Status: DC

## 2020-01-30 MED ORDER — SUCRALFATE 1 G PO TABS: ORAL_TABLET | ORAL | 0 refills | Status: DC

## 2020-01-30 MED ORDER — PANTOPRAZOLE SODIUM 40 MG PO TBEC: DELAYED_RELEASE_TABLET | ORAL | 11 refills | Status: DC

## 2020-01-30 NOTE — Progress Notes (Signed)
Subjective:    Patient ID: April [name], female   DOB: 1960-12-27, 59 y.o.   MRN: [id number]   HPI   1.  Multiple complaints:  Stopped Cymbalta December 31st.  Tapered dose gradually until off starting in 09/2019.     1.  Skin lesions, chronic recurrent:  Feels her pruritic skin lesions are secondary to the Cymbalta. Patient has had fairly significant problem with the skin itching and lesions since 2012,  Well before was started on Cymbalta.   Diagnosed by Dermatology as Neurodermatitis. Discussed she had similar complaints while on Citalopram She feels the rash is much better off Cymbalta. She has previously been treated with Wellbutrin, but cannot remember if had rash with this.   Not sure if ever treated with Prozac. Does not believe she was ever treated with Zoloft or Paxil  2.  Depression/fibromyalgia:  Crying all the time.  Hurts all over.  For fibromyalgia and neuropathy from breast cancer treatment, treated with Gabapentin for pain previously, but also feels this exacerbated her skin lesions and stopped. She does not believe Lyrica was every used in past.  3.  Sore throat with some cough and sense of wheezing.  Hard to swallow--pain extends to lower throat area--points to just above sternal notch.  Perhaps some sinus pressure and posterior pharyngeal drainage.  Also describes a burning into throat from epigastrium, Epigastric discomfort despite taking Omeprazole daily.  Epigastric bloating as well.  No fever, though has felt warm at times.    . Current Meds  Medication Sig  . b complex vitamins tablet Take 1 tablet by mouth daily.  . butalbital-acetaminophen-caffeine (FIORICET, ESGIC) 50-325-40 MG tablet TAKE 2 TABLETS BY MOUTH EVERY 6 HOURS AS NEEDED FOR HEADACHE/MIGRAINE  . CALCIUM-VITAMIN D-VITAMIN K PO Take by mouth. 1 daily  . cetirizine (ZYRTEC) 10 MG tablet Take 10 mg by mouth daily.  Marland Kitchen denosumab (PROLIA) 60 MG/ML SOSY injection Inject 60 mg into the skin every 6  (six) months.  . ECHINACEA PO Take 2 capsules by mouth 2 (two) times daily as needed (Only takes when she is getting a cold).   Marland Kitchen exemestane (AROMASIN) 25 MG tablet Take 1 tablet (25 mg total) by mouth daily after breakfast.  . ibuprofen (ADVIL,MOTRIN) 200 MG tablet Take 600 mg by mouth as needed for headache or moderate pain.  Marland Kitchen omeprazole (PRILOSEC) 20 MG capsule Take 20 mg by mouth daily.  . Probiotic Product (PROBIOTIC DAILY PO) Take by mouth. 1 daily  . valACYclovir (VALTREX) 1000 MG tablet 1/2 tab by mouth twice daily  . vitamin B-12 (CYANOCOBALAMIN) 1000 MCG tablet Take 1,000 mcg by mouth daily.   Allergies  Allergen Reactions  . Hydrocodone Nausea Only and Other (See Comments)    dizziness  . Ultram [Tramadol Hcl] Nausea Only  . Gabapentin Rash     Review of Systems    Objective:   BP 128/88 (BP Location: Left Arm, Patient Position: Sitting, Cuff Size: Large)   Pulse 82   Resp 12   Ht 5\' 3"  (1.6 m)   Wt 225 lb (102.1 kg)   SpO2 97%   BMI 39.86 kg/m   Physical Exam  Very frustrated and tearful. HEENT:  PERRL, EOMI, tender over bilateral frontal and maxillary sinus areas.  Mild pharyngeal erythema without exudate, swab taken for rapid strep.   Neck:  Supple, No adenopathy Chest:  Mild scattered end expiratory wheeze.  No crackles. CV:  RRR without murmur or rub.  Radial and  DP pulses normal and equal Abd:  S, Mild epigastric tenderness.  No HSM or mass, + BS Skin:  Previous lesions on skin at different levels of healing.   Assessment & Plan  1.  Depression/Fibromyalgia:  Though she did taper Cymbalta, very concerned she needs to be on chronic anti depressant treatment:  Will try bupropion SR 150 mg twice daily.  Discussed will give one time Rx for Clonazepam 1/2 to 1 mg twice daily as needed if develops heightened anxiety with start of Bupropion. For Fibromyalgia--if diffuse pain does not improve with Bupropion and control of depression, consider addition of Lyrica  and watch for return of skin lesions.  2.  Skin lesions/rash:  Appear to be in state of healing.  Have not been able to confirm cause.  Has had for more than a decade on and off and attributed to different meds per patient.  Dermatology in recent past have felt a neurodermatitis.  3.  Sinusitis and mild acute bronchitis:  OTC antihistamine with recent spring allergens beginning.  Azithromycin  500 mg once daily for 5 days. Rapid strep negative. CBC and CMP  4.  Worsening GERD as another element of lower throat discomfort:  Switch to Pantoprazole 40 mg daily and add Carafate 1 gm slurry before meals for next month.  Follow up in 1 week for start of bupropion

## 2020-01-31 LAB — CBC WITH DIFFERENTIAL/PLATELET
Basophils Absolute: 0 10*3/uL (ref 0.0–0.2)
Basos: 1 %
EOS (ABSOLUTE): 0.1 10*3/uL (ref 0.0–0.4)
Eos: 2 %
Hematocrit: 40.6 % (ref 34.0–46.6)
Hemoglobin: 14.6 g/dL (ref 11.1–15.9)
Immature Grans (Abs): 0 10*3/uL (ref 0.0–0.1)
Immature Granulocytes: 0 %
Lymphocytes Absolute: 0.8 10*3/uL (ref 0.7–3.1)
Lymphs: 15 %
MCH: 34.7 pg — ABNORMAL HIGH (ref 26.6–33.0)
MCHC: 36 g/dL — ABNORMAL HIGH (ref 31.5–35.7)
MCV: 96 fL (ref 79–97)
Monocytes Absolute: 0.5 10*3/uL (ref 0.1–0.9)
Monocytes: 10 %
Neutrophils Absolute: 3.6 10*3/uL (ref 1.4–7.0)
Neutrophils: 72 %
Platelets: 280 10*3/uL (ref 150–450)
RBC: 4.21 x10E6/uL (ref 3.77–5.28)
RDW: 12.8 % (ref 11.7–15.4)
WBC: 5 10*3/uL (ref 3.4–10.8)

## 2020-01-31 LAB — COMPREHENSIVE METABOLIC PANEL
ALT: 15 IU/L (ref 0–32)
AST: 17 IU/L (ref 0–40)
Albumin/Globulin Ratio: 1.5 (ref 1.2–2.2)
Albumin: 3.9 g/dL (ref 3.8–4.9)
Alkaline Phosphatase: 103 IU/L (ref 39–117)
BUN/Creatinine Ratio: 12 (ref 9–23)
BUN: 10 mg/dL (ref 6–24)
Bilirubin Total: 0.2 mg/dL (ref 0.0–1.2)
CO2: 22 mmol/L (ref 20–29)
Calcium: 9.5 mg/dL (ref 8.7–10.2)
Chloride: 106 mmol/L (ref 96–106)
Creatinine, Ser: 0.85 mg/dL (ref 0.57–1.00)
GFR calc Af Amer: 88 mL/min/{1.73_m2} (ref >59)
GFR calc non Af Amer: 76 mL/min/{1.73_m2} (ref >59)
Globulin, Total: 2.6 g/dL (ref 1.5–4.5)
Glucose: 86 mg/dL (ref 65–99)
Potassium: 4.5 mmol/L (ref 3.5–5.2)
Sodium: 143 mmol/L (ref 134–144)
Total Protein: 6.5 g/dL (ref 6.0–8.5)

## 2020-02-06 ENCOUNTER — Ambulatory Visit (INDEPENDENT_AMBULATORY_CARE_PROVIDER_SITE_OTHER): Payer: Medicaid Other | Admitting: Internal Medicine

## 2020-02-06 ENCOUNTER — Encounter: Payer: Self-pay | Admitting: Internal Medicine

## 2020-02-06 ENCOUNTER — Other Ambulatory Visit: Payer: Self-pay

## 2020-02-06 VITALS — BP 128/80 | HR 76 | Resp 12 | Ht 63.0 in | Wt 220.0 lb

## 2020-02-06 DIAGNOSIS — R21 Rash and other nonspecific skin eruption: Secondary | ICD-10-CM | POA: Diagnosis not present

## 2020-02-06 DIAGNOSIS — R140 Abdominal distension (gaseous): Secondary | ICD-10-CM

## 2020-02-06 DIAGNOSIS — F329 Major depressive disorder, single episode, unspecified: Secondary | ICD-10-CM | POA: Diagnosis not present

## 2020-02-06 DIAGNOSIS — M797 Fibromyalgia: Secondary | ICD-10-CM

## 2020-02-06 DIAGNOSIS — F32A Depression, unspecified: Secondary | ICD-10-CM

## 2020-02-06 NOTE — Progress Notes (Signed)
Subjective:    Patient ID: April [name], female   DOB: 10-29-1961, 59 y.o.   MRN: [id number]   HPI   1.  Depression:  Mood is much better, happier and less frustrated with other.  No suicidal or homicidal thoughts.   States at today her daughter had been off her medicine as well, but is now back on and both are doing better with each other as well.   2.  Fibromyalgia:  Pain is better.    3.  Abdominal bloating:  For a couple of weeks.  Seems started when developed nausea and vomiting with sore throat.  Carafate and Pantoprazole have helped, particularly the Carafate with her throat issues. Feels the bloating is worse in her epigastrium and lower abdomen, maybe even in periumbilical area.   Stools are still a bit loose at times, no hematochezia or melena. BMs are daily.   If she belches, passes flatus or stool, the sense of being bloated feels better.    She ate Pasta alfredo with baked chicken in past 2 days.     4.  Rash:  Doing okay with start of Bupropion.    5.  Sinusitis:  Much better after course of Azithromycin.  Current Meds  Medication Sig  . b complex vitamins tablet Take 1 tablet by mouth daily.  Marland Kitchen buPROPion (WELLBUTRIN SR) 150 MG 12 hr tablet 1 tab by mouth in morning daily for 3 days, then increase to 1 tab by mouth twice daily  . CALCIUM-VITAMIN D-VITAMIN K PO Take by mouth. 1 daily  . cetirizine (ZYRTEC) 10 MG tablet Take 10 mg by mouth daily.  Marland Kitchen denosumab (PROLIA) 60 MG/ML SOSY injection Inject 60 mg into the skin every 6 (six) months.  . ECHINACEA PO Take 2 capsules by mouth 2 (two) times daily as needed (Only takes when she is getting a cold).   Marland Kitchen exemestane (AROMASIN) 25 MG tablet Take 1 tablet (25 mg total) by mouth daily after breakfast.  . ibuprofen (ADVIL,MOTRIN) 200 MG tablet Take 600 mg by mouth as needed for headache or moderate pain.  . pantoprazole (PROTONIX) 40 MG tablet 1 tab by mouth on empty stomach at bedtime daily  . Probiotic Product  (PROBIOTIC DAILY PO) Take by mouth. 1 daily  . sucralfate (CARAFATE) 1 g tablet Mix 1g tab in 10 ml water and take by mouth 3 times daily before meals.  . valACYclovir (VALTREX) 1000 MG tablet 1/2 tab by mouth twice daily  . vitamin B-12 (CYANOCOBALAMIN) 1000 MCG tablet Take 1,000 mcg by mouth daily.   Allergies  Allergen Reactions  . Hydrocodone Nausea Only and Other (See Comments)    dizziness  . Ultram [Tramadol Hcl] Nausea Only  . Gabapentin Rash     Review of Systems    Objective:   BP 128/80 (BP Location: Left Arm, Patient Position: Sitting, Cuff Size: Large)   Pulse 76   Resp 12   Ht 5\' 3"  (1.6 m)   Wt 220 lb (99.8 kg)   BMI 38.97 kg/m   Physical Exam  More animated and happy.  Not tearful as before. HEENT:  PERRL, EOMI, TMs pearly gray, NT over sinuses, throat without injection Lungs:  CTA CV:  RRR without murmur or rub. Skin:  Scratched skin lesions look similar to 1 week ago on arms.   Assessment & Plan  1.  Depression and Fibromyalgia:   Improved on Bupropion.   CPM.  Follow up  in 5 weeks.  2.  Skin lesions: no worsening with Bupropion.    3.  Abdominal Bloating:  Seems improved.  CPM with Pantoprazole.  Should be done with Carafate soon.    Addendum on 02/09/2020:  Complaining of increased gas.  Sent in Rx for Bentyl

## 2020-02-09 ENCOUNTER — Telehealth: Payer: Self-pay | Admitting: Internal Medicine

## 2020-02-09 MED ORDER — DICYCLOMINE HCL 20 MG PO TABS: ORAL_TABLET | ORAL | 1 refills | Status: DC

## 2020-02-09 NOTE — Telephone Encounter (Signed)
Patient called complaining for acid reflux coming up to her neck and really bad  abdominal pain.

## 2020-02-09 NOTE — Telephone Encounter (Signed)
Per Dr. Amil Amen called patient back to find out if patient is having fever or chills or if just feeling bloated? Patient stated no fever or chills just really bloated and pain.

## 2020-02-18 ENCOUNTER — Ambulatory Visit: Payer: Medicaid Other | Admitting: Internal Medicine

## 2020-03-02 ENCOUNTER — Other Ambulatory Visit: Payer: Self-pay | Admitting: Oncology

## 2020-03-10 ENCOUNTER — Telehealth: Payer: Self-pay | Admitting: Internal Medicine

## 2020-03-10 NOTE — Telephone Encounter (Signed)
Patient called this morning requesting appointment sooner than Friday 03/12/2020; patient stating has been feeling ill with stomach ache and body ache x two days. Patient also states her heart started racing without reason making patient get scared. Patient denied any fever, nausea or diarrhea and states is taking ibuprofen 4 pills 200 mg once a day.  Please contact patient to triage

## 2020-03-10 NOTE — Telephone Encounter (Signed)
Left message for patient to call back to get more information on symptoms

## 2020-03-11 NOTE — Telephone Encounter (Signed)
Patient returned my call this morning. Stating she feels like she is having a new episode that started on this past Monday. Patient states she is having mainly abdominal pain. Patient feels like she may have a ulcer. Asking for endoscopy and colonoscopy to see what is wrong with her stomach. States she feels hungry and has a gnawing sensation. Patient states after she eats she really has pain. Patient just not sure what is going on with her body. Patient confirmed she will follow up with Dr. Amil Amen tomorrow to discuss this further.

## 2020-03-12 ENCOUNTER — Other Ambulatory Visit: Payer: Self-pay

## 2020-03-12 ENCOUNTER — Encounter: Payer: Self-pay | Admitting: Internal Medicine

## 2020-03-12 ENCOUNTER — Ambulatory Visit: Payer: Medicaid Other | Admitting: Internal Medicine

## 2020-03-12 VITALS — BP 122/84 | HR 74 | Resp 12 | Ht 63.0 in | Wt 222.0 lb

## 2020-03-12 DIAGNOSIS — R1013 Epigastric pain: Secondary | ICD-10-CM | POA: Diagnosis not present

## 2020-03-12 DIAGNOSIS — R21 Rash and other nonspecific skin eruption: Secondary | ICD-10-CM | POA: Diagnosis not present

## 2020-03-12 DIAGNOSIS — M797 Fibromyalgia: Secondary | ICD-10-CM

## 2020-03-12 DIAGNOSIS — F329 Major depressive disorder, single episode, unspecified: Secondary | ICD-10-CM | POA: Diagnosis not present

## 2020-03-12 DIAGNOSIS — F32A Depression, unspecified: Secondary | ICD-10-CM

## 2020-03-12 DIAGNOSIS — R140 Abdominal distension (gaseous): Secondary | ICD-10-CM

## 2020-03-12 MED ORDER — SUCRALFATE 1 G PO TABS: ORAL_TABLET | ORAL | 0 refills | Status: DC

## 2020-03-12 MED ORDER — BUPROPION HCL ER (SR) 150 MG PO TB12: ORAL_TABLET | ORAL | 11 refills | Status: DC

## 2020-03-12 NOTE — Progress Notes (Signed)
Subjective:    Patient ID: April [name], female   DOB: 1961/07/31, 59 y.o.   MRN: [id number]   HPI   1.  Worsening abdominal pain and bloating.   Has had 3 "attacks"  In past week. Describes a sharper epigastric pain possibly after not eating for a prolonged time and at other times, can occur while she is eating or about 20 minutes after eating.  Feels she is eating at times to help her more regular epigastric pain.  States her abdomen is gurgling constantly like she is hungry and very bloated all the time. She can take a few bites of food and then has to stop as pain can get worse.   She does still have her gallbladder. She cannot say that passing gas in either direction really helps her discomfort.   She has not tried to take the Dicyclomine before a meal.     She was on Pantoprazole at one point and felt it made her bloating worse, so switched to Omeprazole.   She had developed soreness and irritation into her throat back in February and a course of Carafate helped that.  She stopped that at beginning of March as was doing well with that complaint. She does now have some discomfort again up into her chest with the bloating Stools are soft, though not diarrhea.   No melena or hematochezia.   She at times has very small caliber stools. She does have explosive stools at times.  Has not had weight loss.   Has had appendectomy--2018.  CT of abdomen then was benign otherwise. History of breast cancer in 2017   Current Meds  Medication Sig  . b complex vitamins tablet Take 1 tablet by mouth daily.  Marland Kitchen buPROPion (WELLBUTRIN SR) 150 MG 12 hr tablet 1 tab by mouth in morning daily for 3 days, then increase to 1 tab by mouth twice daily  . CALCIUM-VITAMIN D-VITAMIN K PO Take by mouth. 1 daily  . cetirizine (ZYRTEC) 10 MG tablet Take 10 mg by mouth daily.  . clonazePAM (KLONOPIN) 1 MG tablet Take 1 tablet (1 mg total) by mouth 2 (two) times daily as needed for anxiety.  Marland Kitchen denosumab  (PROLIA) 60 MG/ML SOSY injection Inject 60 mg into the skin every 6 (six) months.  . dicyclomine (BENTYL) 20 MG tablet 1/2 to 1 tab by mouth every 6 hours as needed for bloating/abdominal pain  . exemestane (AROMASIN) 25 MG tablet TAKE 1 TABLET BY MOUTH EVERY DAY AFTER BREAKFAST  . ibuprofen (ADVIL,MOTRIN) 200 MG tablet Take 600 mg by mouth as needed for headache or moderate pain.  Marland Kitchen omeprazole (PRILOSEC) 20 MG capsule Take 20 mg by mouth daily.  . Probiotic Product (PROBIOTIC DAILY PO) Take by mouth. 1 daily  . valACYclovir (VALTREX) 1000 MG tablet 1/2 tab by mouth twice daily  . vitamin B-12 (CYANOCOBALAMIN) 1000 MCG tablet Take 1,000 mcg by mouth daily.   Allergies  Allergen Reactions  . Hydrocodone Nausea Only and Other (See Comments)    dizziness  . Ultram [Tramadol Hcl] Nausea Only  . Gabapentin Rash     Review of Systems    Objective:   BP 122/84 (BP Location: Left Arm, Patient Position: Sitting, Cuff Size: Large)   Pulse 74   Resp 12   Ht 5\' 3"  (1.6 m)   Wt 222 lb (100.7 kg)   BMI 39.33 kg/m   Physical Exam  NAD Lungs:  CTA CV:  RRR without murmur  or rub. Abd:  S, Mild diffuse tenderness, most pronounced in lower quads bilaterally.  Negative Murphy's and no real RUQ tenderness.  Minimal tenderness in mid epigastrium.  No HSM or mass.  Skin:  Mainly healing lesions, though some scattered new on arms.     Assessment & Plan   1.  Prolonged Epigastric pain and bloating:  CBC, CMP in February were normal.  Add Lipase, but unlikely to be pancreatitis.  Recheck CMP, CBC. She will restart Carafate as a slurry before meals and perhaps at bedtime. May use the Dicyclomine before meals as well. She has had EGD and colonoscopy somewhere in [place] over 10 years ago, but cannot remember where.   Was being evaluated for Celiac Disease for previous EGD.   Reportedly both were normal save for diverticulosis Referral to Dr. Cristina Gong at [facility]. Consider repeat CT of  abdomen/pelvis if no findings with GI work up.  Again, much of this occurred following stop of Cymbalta in November followed by an acute type illness with cough and vomiting in February.  See notes from Feb and March.  2.  Skin lesions:  Does appear improved with start of Bupropion.  Never saw her skin when she decided to stop Cymbalta back in October as she felt caused by Cymbalta.  3.  Depression/Fibromyalgia: improved on Bupropion

## 2020-03-12 NOTE — Patient Instructions (Signed)
Restart Carafate 3 to 4 times daily as slurry.

## 2020-03-13 LAB — CBC WITH DIFFERENTIAL/PLATELET
Basophils Absolute: 0 10*3/uL (ref 0.0–0.2)
Basos: 1 %
EOS (ABSOLUTE): 0.1 10*3/uL (ref 0.0–0.4)
Eos: 2 %
Hematocrit: 43.7 % (ref 34.0–46.6)
Hemoglobin: 15.1 g/dL (ref 11.1–15.9)
Immature Grans (Abs): 0 10*3/uL (ref 0.0–0.1)
Immature Granulocytes: 0 %
Lymphocytes Absolute: 0.9 10*3/uL (ref 0.7–3.1)
Lymphs: 23 %
MCH: 34.6 pg — ABNORMAL HIGH (ref 26.6–33.0)
MCHC: 34.6 g/dL (ref 31.5–35.7)
MCV: 100 fL — ABNORMAL HIGH (ref 79–97)
Monocytes Absolute: 0.4 10*3/uL (ref 0.1–0.9)
Monocytes: 10 %
Neutrophils Absolute: 2.6 10*3/uL (ref 1.4–7.0)
Neutrophils: 64 %
Platelets: 275 10*3/uL (ref 150–450)
RBC: 4.37 x10E6/uL (ref 3.77–5.28)
RDW: 13.6 % (ref 11.7–15.4)
WBC: 4 10*3/uL (ref 3.4–10.8)

## 2020-03-13 LAB — COMPREHENSIVE METABOLIC PANEL
ALT: 9 IU/L (ref 0–32)
AST: 16 IU/L (ref 0–40)
Albumin/Globulin Ratio: 1.7 (ref 1.2–2.2)
Albumin: 4.5 g/dL (ref 3.8–4.9)
Alkaline Phosphatase: 109 IU/L (ref 39–117)
BUN/Creatinine Ratio: 11 (ref 9–23)
BUN: 11 mg/dL (ref 6–24)
Bilirubin Total: 0.3 mg/dL (ref 0.0–1.2)
CO2: 23 mmol/L (ref 20–29)
Calcium: 10.2 mg/dL (ref 8.7–10.2)
Chloride: 102 mmol/L (ref 96–106)
Creatinine, Ser: 1.02 mg/dL — ABNORMAL HIGH (ref 0.57–1.00)
GFR calc Af Amer: 71 mL/min/{1.73_m2} (ref >59)
GFR calc non Af Amer: 61 mL/min/{1.73_m2} (ref >59)
Globulin, Total: 2.7 g/dL (ref 1.5–4.5)
Glucose: 84 mg/dL (ref 65–99)
Potassium: 4.7 mmol/L (ref 3.5–5.2)
Sodium: 138 mmol/L (ref 134–144)
Total Protein: 7.2 g/dL (ref 6.0–8.5)

## 2020-03-13 LAB — LIPASE: Lipase: 19 U/L (ref 14–72)

## 2020-03-15 ENCOUNTER — Telehealth: Payer: Self-pay | Admitting: Internal Medicine

## 2020-03-15 NOTE — Telephone Encounter (Signed)
Patient called to provided information for EGD and colonoscopy done 10 years ago. Patient stated those were done on 11/21/2010 by Dr. Anson Fret at [facility]

## 2020-03-16 NOTE — Telephone Encounter (Signed)
To Dr. Amil Amen for [facility]

## 2020-03-19 LAB — SPECIMEN STATUS REPORT

## 2020-03-19 LAB — VITAMIN B12: Vitamin B-12: 237 pg/mL (ref 232–1245)

## 2020-04-08 ENCOUNTER — Other Ambulatory Visit: Payer: Self-pay | Admitting: Physician Assistant

## 2020-04-08 ENCOUNTER — Ambulatory Visit
Admission: RE | Admit: 2020-04-08 | Discharge: 2020-04-08 | Disposition: A | Discharge status: Home / Self Care | Payer: Medicaid Other | Source: Ambulatory Visit | Attending: Physician Assistant | Admitting: Physician Assistant

## 2020-04-08 DIAGNOSIS — R0602 Shortness of breath: Secondary | ICD-10-CM

## 2020-04-08 DIAGNOSIS — R1013 Epigastric pain: Secondary | ICD-10-CM

## 2020-04-09 ENCOUNTER — Other Ambulatory Visit: Payer: Self-pay | Admitting: Physician Assistant

## 2020-04-09 DIAGNOSIS — R1013 Epigastric pain: Secondary | ICD-10-CM

## 2020-04-09 DIAGNOSIS — R918 Other nonspecific abnormal finding of lung field: Secondary | ICD-10-CM

## 2020-04-12 ENCOUNTER — Telehealth: Payer: Self-pay | Admitting: *Deleted

## 2020-04-12 ENCOUNTER — Telehealth: Payer: Self-pay | Admitting: Internal Medicine

## 2020-04-12 ENCOUNTER — Other Ambulatory Visit: Payer: Medicaid Other | Admitting: Internal Medicine

## 2020-04-12 ENCOUNTER — Other Ambulatory Visit: Payer: Self-pay

## 2020-04-12 ENCOUNTER — Inpatient Hospital Stay: Payer: Medicaid Other | Attending: Oncology | Admitting: Oncology

## 2020-04-12 VITALS — BP 128/61 | HR 101 | Temp 98.5°F | Resp 18 | Ht 63.0 in | Wt 225.4 lb

## 2020-04-12 DIAGNOSIS — C50411 Malignant neoplasm of upper-outer quadrant of right female breast: Secondary | ICD-10-CM | POA: Insufficient documentation

## 2020-04-12 DIAGNOSIS — R5383 Other fatigue: Secondary | ICD-10-CM

## 2020-04-12 DIAGNOSIS — Z170 Estrogen receptor positive status [ER+]: Secondary | ICD-10-CM | POA: Diagnosis not present

## 2020-04-12 DIAGNOSIS — Z780 Asymptomatic menopausal state: Secondary | ICD-10-CM | POA: Diagnosis not present

## 2020-04-12 DIAGNOSIS — Z87891 Personal history of nicotine dependence: Secondary | ICD-10-CM | POA: Insufficient documentation

## 2020-04-12 DIAGNOSIS — K219 Gastro-esophageal reflux disease without esophagitis: Secondary | ICD-10-CM | POA: Insufficient documentation

## 2020-04-12 DIAGNOSIS — Z79811 Long term (current) use of aromatase inhibitors: Secondary | ICD-10-CM | POA: Diagnosis not present

## 2020-04-12 DIAGNOSIS — Z79899 Other long term (current) drug therapy: Secondary | ICD-10-CM | POA: Diagnosis not present

## 2020-04-12 DIAGNOSIS — Z9221 Personal history of antineoplastic chemotherapy: Secondary | ICD-10-CM | POA: Insufficient documentation

## 2020-04-12 DIAGNOSIS — Z923 Personal history of irradiation: Secondary | ICD-10-CM | POA: Diagnosis not present

## 2020-04-12 MED ORDER — VENLAFAXINE HCL ER 37.5 MG PO CP24: ORAL_CAPSULE | ORAL | 2 refills | Status: DC

## 2020-04-12 MED ORDER — VENLAFAXINE HCL ER 75 MG PO CP24: 75.0000 mg | ORAL_CAPSULE | Freq: Every day | ORAL | 12 refills | Status: DC

## 2020-04-12 NOTE — Telephone Encounter (Signed)
This RN spoke with pt today per her call - post call last week stating she had xray showing an abnormality in her chest " and just wanted to let Dr Jannifer Rodney know "  Per discussion today- Ganiya states over the weekend she has now developed swelling in her right breast and arm " l guess it's lymphedema "  Per further inquiry she verified that it is tender to touch with increased warmth " feels feverish " .  Per review with MD- pt is to come in today for evaluation.  Of note pt states she is currently under work up for GI issues which occurred recently.  She states several months ago she developed SOB- then she developed early saitity with some reflux- and abd swelling.  She is scheduled for an upper GI later this week.  Appointment scheduled for today at 76- MD- labs can be done after visit post MD review for appropriate labs.  Appointment made by this RN.
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
## 2020-04-12 NOTE — Telephone Encounter (Signed)
Was seen end of last week by [facility] GI PA. CXR with right hilar lobulated possible mass.   CT ordered, but could not get in until May 24th.   Upper GI planned for later this Thursday at 8:45 a.m. Pantoprazole now twice daily.  Was initiated on Venlafaxine ER 75 mg daily today for hot flashes with Dr. Jana Hakim. Discussed concern for higher levels of Venlafaxine with Bupropion on board. She is willing to start at a lower dose and titrate up. She is now complaining of significant fatigue and increased dyspnea and less in way of abdominal bloating as primary concern.    Recognized in Dr. Virgie Dad note she still has not been vaccinated for COVID 19--she is willing to come in and get vaccinated today with Moderna as we have vaccine left.  Will obtain TSH as well with her nurse visit today and I will send in 37.5 mg Venlafaxine.

## 2020-04-12 NOTE — Telephone Encounter (Signed)
Patient aware in for Labs and Covid vaccine

## 2020-04-13 ENCOUNTER — Telehealth: Payer: Self-pay | Admitting: Oncology

## 2020-04-13 LAB — TSH: TSH: 1.92 u[IU]/mL (ref 0.450–4.500)

## 2020-04-13 NOTE — Telephone Encounter (Signed)
No 5/10 los. No changes made to pt's schedule.

## 2020-04-14 ENCOUNTER — Other Ambulatory Visit (HOSPITAL_COMMUNITY): Payer: Self-pay | Admitting: Physician Assistant

## 2020-04-14 DIAGNOSIS — R1013 Epigastric pain: Secondary | ICD-10-CM

## 2020-04-14 NOTE — Telephone Encounter (Signed)
Patient appointment moved up from 04/26/20 to 04/21/20 @ [facility]. Patient appointment time is 10 am and patient needs to arrive by 9:45 am. She needs to pick up contrast before appointment at [facility] long. Patient will go to radiology on the first floor to pick up contrast dye which is the same location as test. Nothing to eat or drink 4 hours before test and patient needs to drink first bottle @ 8 am and second bottle @ 9 am.

## 2020-04-15 ENCOUNTER — Ambulatory Visit
Admission: RE | Admit: 2020-04-15 | Discharge: 2020-04-15 | Disposition: A | Discharge status: Home / Self Care | Payer: Medicaid Other | Source: Ambulatory Visit | Attending: Physician Assistant | Admitting: Physician Assistant

## 2020-04-15 DIAGNOSIS — R1013 Epigastric pain: Secondary | ICD-10-CM

## 2020-04-15 NOTE — Telephone Encounter (Signed)
Spoke with patient. Aware of changes

## 2020-04-21 ENCOUNTER — Ambulatory Visit (HOSPITAL_COMMUNITY): Admission: RE | Admit: 2020-04-21 | Payer: Medicaid Other | Source: Ambulatory Visit

## 2020-04-23 ENCOUNTER — Ambulatory Visit (HOSPITAL_COMMUNITY): Admission: RE | Admit: 2020-04-23 | Payer: Medicaid Other | Source: Ambulatory Visit

## 2020-04-26 ENCOUNTER — Other Ambulatory Visit: Payer: Medicaid Other

## 2020-04-28 ENCOUNTER — Encounter: Payer: Medicaid Other | Admitting: Internal Medicine

## 2020-04-29 ENCOUNTER — Ambulatory Visit (HOSPITAL_COMMUNITY): Admission: RE | Admit: 2020-04-29 | Payer: Medicaid Other | Source: Ambulatory Visit

## 2020-04-29 ENCOUNTER — Encounter (HOSPITAL_COMMUNITY): Payer: Self-pay

## 2020-05-26 ENCOUNTER — Emergency Department (HOSPITAL_COMMUNITY): Payer: Medicaid Other

## 2020-05-26 ENCOUNTER — Emergency Department (HOSPITAL_COMMUNITY)
Admission: EM | Admit: 2020-05-26 | Discharge: 2020-05-27 | Disposition: A | Discharge status: Home / Self Care | Payer: Medicaid Other | Attending: Emergency Medicine | Admitting: Emergency Medicine

## 2020-05-26 ENCOUNTER — Other Ambulatory Visit: Payer: Self-pay

## 2020-05-26 DIAGNOSIS — R0602 Shortness of breath: Secondary | ICD-10-CM | POA: Diagnosis present

## 2020-05-26 DIAGNOSIS — Z853 Personal history of malignant neoplasm of breast: Secondary | ICD-10-CM | POA: Insufficient documentation

## 2020-05-26 DIAGNOSIS — R9389 Abnormal findings on diagnostic imaging of other specified body structures: Secondary | ICD-10-CM | POA: Insufficient documentation

## 2020-05-26 DIAGNOSIS — Z87891 Personal history of nicotine dependence: Secondary | ICD-10-CM | POA: Insufficient documentation

## 2020-05-26 DIAGNOSIS — R590 Localized enlarged lymph nodes: Secondary | ICD-10-CM | POA: Diagnosis not present

## 2020-05-26 LAB — BASIC METABOLIC PANEL
Anion gap: 11 (ref 5–15)
BUN: 13 mg/dL (ref 6–20)
CO2: 21 mmol/L — ABNORMAL LOW (ref 22–32)
Calcium: 9.3 mg/dL (ref 8.9–10.3)
Chloride: 109 mmol/L (ref 98–111)
Creatinine, Ser: 1.16 mg/dL — ABNORMAL HIGH (ref 0.44–1.00)
GFR calc Af Amer: >60 mL/min (ref >60)
GFR calc non Af Amer: 52 mL/min — ABNORMAL LOW (ref >60)
Glucose, Bld: 92 mg/dL (ref 70–99)
Potassium: 3.6 mmol/L (ref 3.5–5.1)
Sodium: 141 mmol/L (ref 135–145)

## 2020-05-26 LAB — CBC
HCT: 44.1 % (ref 36.0–46.0)
Hemoglobin: 14.7 g/dL (ref 12.0–15.0)
MCH: 34.3 pg — ABNORMAL HIGH (ref 26.0–34.0)
MCHC: 33.3 g/dL (ref 30.0–36.0)
MCV: 102.8 fL — ABNORMAL HIGH (ref 80.0–100.0)
Platelets: 331 10*3/uL (ref 150–400)
RBC: 4.29 MIL/uL (ref 3.87–5.11)
RDW: 13.3 % (ref 11.5–15.5)
WBC: 6 10*3/uL (ref 4.0–10.5)
nRBC: 0 % (ref 0.0–0.2)

## 2020-05-26 LAB — I-STAT BETA HCG BLOOD, ED (MC, WL, AP ONLY): I-stat hCG, quantitative: <5 m[IU]/mL (ref <5)

## 2020-05-26 LAB — TROPONIN I (HIGH SENSITIVITY): Troponin I (High Sensitivity): 2 ng/L (ref <18)

## 2020-05-26 NOTE — ED Notes (Signed)
Pt refused second set of labs. Pt yelling at multiple  night time staff bc day shift staff did a pregnancy test without asking her.

## 2020-05-26 NOTE — ED Triage Notes (Signed)
Pt here for eval of shob at rest x 2 months. Also sts she feels like someone is sitting on her chest. Recently dx with a mass in her chest, is supposed to get a CT for further eval but d/t insurance issues has not had the scan yet. Was advised by her doctor to come to the ED.

## 2020-05-27 ENCOUNTER — Telehealth: Payer: Self-pay

## 2020-05-27 ENCOUNTER — Other Ambulatory Visit: Payer: Self-pay | Admitting: Oncology

## 2020-05-27 ENCOUNTER — Encounter: Payer: Self-pay | Admitting: Oncology

## 2020-05-27 ENCOUNTER — Emergency Department (HOSPITAL_COMMUNITY): Payer: Medicaid Other

## 2020-05-27 ENCOUNTER — Encounter: Payer: Self-pay | Admitting: Internal Medicine

## 2020-05-27 DIAGNOSIS — C50411 Malignant neoplasm of upper-outer quadrant of right female breast: Secondary | ICD-10-CM

## 2020-05-27 LAB — HEPATIC FUNCTION PANEL
ALT: 15 U/L (ref 0–44)
AST: 26 U/L (ref 15–41)
Albumin: 4 g/dL (ref 3.5–5.0)
Alkaline Phosphatase: 109 U/L (ref 38–126)
Bilirubin, Direct: 0.2 mg/dL (ref 0.0–0.2)
Indirect Bilirubin: 0.8 mg/dL (ref 0.3–0.9)
Total Bilirubin: 1 mg/dL (ref 0.3–1.2)
Total Protein: 7.8 g/dL (ref 6.5–8.1)

## 2020-05-27 LAB — LIPASE, BLOOD: Lipase: 24 U/L (ref 11–51)

## 2020-05-27 LAB — TROPONIN I (HIGH SENSITIVITY): Troponin I (High Sensitivity): 2 ng/L (ref <18)

## 2020-05-27 MED ORDER — IOHEXOL 300 MG/ML  SOLN
75.0000 mL | Freq: Once | INTRAMUSCULAR | Status: AC | PRN
  Administered 2020-05-27: 75 mL via INTRAVENOUS

## 2020-05-27 NOTE — ED Provider Notes (Signed)
[name] EMERGENCY DEPARTMENT Provider Note   CSN: [id number] Arrival date & time: 05/26/20  1614     History Chief Complaint  Patient presents with  . Shortness of Breath    April [name] is a 59 y.o. female with pertinent past medical history of breast cancer with chemotherapy, lumpectomies and adjuvant radiation now on oral exemestane presents to the ER for evaluation of shortness of breath for the last 2 to 3 months.  Describes it as having increased work of breathing especially if she takes a hot shower, states that heat makes her breathing harder.  Also feels like activity, laying flat makes her short of breath.  She feels like she is breathing against resistance and like she cannot take a full deep breath.  The symptoms are constant, slightly alleviated if she is sitting down and at rest.  No chest pain with it, feels more like something is "obstructing" her breathing. She has also had months of GI issues including epigastric abdominal discomfort, postprandial bloating which prompted GI visit.  GI did a chest xray that showed a mass on right lung.  Currently taking Protonix and Carafate for it.  She had a "upper endoscopy" by GI and that was normal.  Patient has been trying to get a CT scan of her chest for 6 weeks but unfortunately Medicare/Medicaid has declined it several times.  Was told by her physician that the only way to get the skin might be to come to the ED.  Reports ever since her lumpectomies she has had lingering, mild lymphedema and chronic breast pain that comes and goes, unchanged.  States she used an inhaler that she had from last year but this did not help her shortness of breath.   Denies fever, cough.  Fully vaccinated for COVID-19.  Denies any significant exertional chest pain, pleuritic chest pain.  No nausea or vomiting.  No history of CAD.  No history of hypertension, diabetes, hyperlipidemia.  Patient recently stopped smoking cigarettes but is now  vaping.  No history of lung disease like asthma or COPD.  No recent surgeries, prolonged immobilization, hemoptysis, lower extremity edema, calf pain, history of PE/DVT.   HPI     Past Medical History:  Diagnosis Date  . Allergy 07/28/2012   Seasonal/Environmental allergies  . Anxiety 2013   Since 2013  . Arthritis 2014 per patient    knees and shoulders  . Bilateral ankle fractures 07/2015   Booted and resolved   . Cancer [facility]) dx June 22, 2016   right breast  . Depression 2013   Multiple  episodes  in past.  . Elevated cholesterol 2017  . Fibromyalgia 2013   diagnosed by Dr. [name]  . Genital herpes 2005   Has outbreaks monthly if not on preventative medication  . GERD (gastroesophageal reflux disease) 2013  . History of radiation therapy 02/07/17- 03/21/17   Right Breast- 4 field 25 fractions. 50 Gy to SCLV/PAB in 25 fractions. Right Breast Boost 10 gy in 5 fractions.  . Migraine 2013   migraines  . Neuromuscular disorder ([place]) 03/20/2017   neuropathy in fingers and toes from Chemo--intermittent  . Obesity   . Osteoporosis 03/23/2017   noted per bone density scan  . Peripheral neuropathy 08/13/2017  . Personal history of chemotherapy 11/2016  . Personal history of radiation therapy    4/18  . Right wrist fracture 06/2015   Resolved  . Scoliosis of thoracic spine 01/04/2012  . Skin condition 2012  patient reports periodic episodes of severe itching. She will itch and then blister at areas including her arms, back, and buttocks.   . Urinary, incontinence, stress female 07/14/2016   patient reported    Patient Active Problem List   Diagnosis Date Noted  . Varicose veins of bilateral lower extremities with other complications 60/09/9322  . Dry skin dermatitis 07/27/2019  . Appendicitis 11/28/2018  . De Quervain's disease (tenosynovitis) 05/15/2018  . Bilateral carpal tunnel syndrome 05/15/2018  . 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  Peripheral neuropathy 08/13/2017  . Osteoporosis 03/23/2017  . Breast cancer metastasized to axillary lymph node ([place]) 06/21/2016  . Malignant neoplasm of upper-outer quadrant of right breast in female, estrogen receptor positive ([place]) 06/21/2016  . Elevated blood pressure reading without diagnosis of hypertension 04/28/2016  . Herpes genitalis--since 2005 per patient   . Environmental and seasonal allergies 07/28/2012  . Fibromyalgia   . Anxiety 02/07/2012  . Depression (emotion) 01/04/2012  . Headache-migranes 01/04/2012    Past Surgical History:  Procedure Laterality Date  . AXILLARY LYMPH NODE DISSECTION Right 12/26/2016   Procedure: RIGHT AXILLARY LYMPH NODE DISSECTION;  Surgeon: Excell Seltzer, MD;  Location: [place];  Service: General;  Laterality: Right;  . BREAST LUMPECTOMY Right 2018  . BREAST LUMPECTOMY WITH NEEDLE LOCALIZATION Right 12/19/2016   Procedure: RIGHT BREAST NEEDLE LOCALIZED LUMPECTOMY, RIGHT RADIOACTIVE SEED TARGETED AXILLARY SENTINEL LYMPH NODE BIOPSY;  Surgeon: Excell Seltzer, MD;  Location: [place];  Service: General;  Laterality: Right;  . IR GENERIC HISTORICAL  10/09/2016   IR CV LINE INJECTION 10/09/2016 Aletta Edouard, MD [facility]-INTERV RAD  . LAPAROSCOPIC APPENDECTOMY N/A 11/28/2018   Procedure: APPENDECTOMY LAPAROSCOPIC;  Surgeon: [name], MD;  Location: [place];  Service: General;  Laterality: N/A;  . PORT-A-CATH REMOVAL Left 12/19/2016   Procedure: REMOVAL PORT-A-CATH;  Surgeon: Excell Seltzer, MD;  Location: [place];  Service: General;  Laterality: Left;  . PORTACATH PLACEMENT N/A 07/11/2016   Procedure: INSERTION PORT-A-CATH;  Surgeon: Excell Seltzer, MD;  Location: [facility];  Service: General;  Laterality: N/A;  . RADIOACTIVE SEED GUIDED AXILLARY SENTINEL LYMPH NODE Right 12/19/2016   Procedure: RADIOACTIVE SEED GUIDED AXILLARY SENTINEL LYMPH NODE BIOPSY;  Surgeon: Excell Seltzer, MD;   Location: [place];  Service: General;  Laterality: Right;  . WISDOM TOOTH EXTRACTION  yrs ago     OB History    Gravida  3   Para  1   Term  1   Preterm      AB  2   Living  1     SAB      TAB  2   Ectopic      Multiple      Live Births              Family History  Problem Relation Age of Onset  . Arthritis Mother   . Hypertension Mother   . Heart disease Mother   . Dementia Mother   . Irritable bowel syndrome Mother   . Emphysema Father   . Cancer Father        bladder  . Cerebral aneurysm Father        ruptured aneurysm was cause of death  . Bipolar disorder Daughter        Not clear if this is the case.  Possibly Bipolar II  . Depression Daughter   . Graves' disease Sister   . Vitiligo Sister   .  Mental illness Brother        Depression  . Mental illness Sister        likely undiagnosed schizophrenia  . Mental illness Brother        Schizophrenia    Social History   Tobacco Use  . Smoking status: Former Smoker    Packs/day: 0.50    Years: 15.00    Pack years: 7.50    Types: Cigarettes    Quit date: 01/21/1994    Years since quitting: 26.3  . Smokeless tobacco: Never Used  Vaping Use  . Vaping Use: Never used  Substance Use Topics  . Alcohol use: Yes    Alcohol/week: 2.0 - 4.0 standard drinks    Types: 2 - 4 Standard drinks or equivalent per week    Comment: rarely  . Drug use: Yes    Types: Marijuana    Comment: last smoked 6 months ago    Home Medications Prior to Admission medications   Medication Sig Start Date End Date Taking? Authorizing Provider  b complex vitamins tablet Take 1 tablet by mouth daily.   Yes [provider]  buPROPion (WELLBUTRIN SR) 150 MG 12 hr tablet 1 tab by mouth in morning daily for 3 days, then increase to 1 tab by mouth twice daily Patient taking differently: Take 150 mg by mouth 2 (two) times daily.  03/12/20  Yes [name], MD  butalbital-acetaminophen-caffeine  (FIORICET, ESGIC) 50-325-40 MG tablet TAKE 2 TABLETS BY MOUTH EVERY 6 HOURS AS NEEDED FOR HEADACHE/MIGRAINE Patient taking differently: Take 2 tablets by mouth every 6 (six) hours as needed for headache or migraine.  10/23/17  Yes Magrinat, Virgie Dad, MD  CALCIUM-VITAMIN D-VITAMIN K PO Take 1 tablet by mouth daily.    Yes [provider]  cetirizine (ZYRTEC) 10 MG tablet Take 10 mg by mouth daily.   Yes [provider]  denosumab (PROLIA) 60 MG/ML SOSY injection Inject 60 mg into the skin every 6 (six) months.   Yes [provider]  ECHINACEA PO Take 2 capsules by mouth 2 (two) times daily as needed (Only takes when she is getting a cold).  07/02/17  Yes [provider]  exemestane (AROMASIN) 25 MG tablet TAKE 1 TABLET BY MOUTH EVERY DAY AFTER BREAKFAST 03/02/20  Yes Magrinat, Virgie Dad, MD  pantoprazole (PROTONIX) 40 MG tablet Take 40 mg by mouth at bedtime. 04/16/20  Yes [provider]  Probiotic Product (PROBIOTIC DAILY PO) Take 1 tablet by mouth daily. 1 daily    Yes [provider]  valACYclovir (VALTREX) 1000 MG tablet 1/2 tab by mouth twice daily Patient taking differently: Take 500 mg by mouth 2 (two) times daily.  06/18/19  Yes [name], MD  vitamin B-12 (CYANOCOBALAMIN) 1000 MCG tablet Take 1,000 mcg by mouth daily.   Yes [provider]  sucralfate (CARAFATE) 1 g tablet Mix 1g tab in 10 ml water and take by mouth 3 times daily before meals. Patient not taking: Reported on 05/27/2020 03/12/20   [name], MD  venlafaxine XR (EFFEXOR-XR) 37.5 MG 24 hr capsule 1 cap by mouth daily Patient not taking: Reported on 05/27/2020 04/12/20   [name], MD    Allergies    Cymbalta [duloxetine hcl], Hydrocodone, Ultram [tramadol hcl], Venlafaxine, and Gabapentin  Review of Systems   Review of Systems  Respiratory: Positive for shortness of breath.   Gastrointestinal: Positive for abdominal pain.   Allergic/Immunologic: Positive for immunocompromised state.  All other systems reviewed  and are negative.   Physical Exam Updated Vital Signs BP 120/83 (BP Location: Left Arm)   Pulse 88   Temp 98.1 F (36.7 C) (Oral)   Resp 18   Ht 5\' 4"  (1.626 m)   Wt 100.7 kg   SpO2 98%   BMI 38.11 kg/m   Physical Exam Vitals and nursing note reviewed.  Constitutional:      General: She is not in acute distress.    Appearance: She is well-developed.     Comments: NAD.  HENT:     Head: Normocephalic and atraumatic.     Right Ear: External ear normal.     Left Ear: External ear normal.     Nose: Nose normal.  Eyes:     General: No scleral icterus.    Conjunctiva/sclera: Conjunctivae normal.  Cardiovascular:     Rate and Rhythm: Normal rate and regular rhythm.     Heart sounds: Normal heart sounds. No murmur heard.      Comments: No LE edema. No calf tenderness. 1+ radial and Dp pulses bilaterally  Pulmonary:     Effort: Pulmonary effort is normal.     Breath sounds: Normal breath sounds.     Comments: Normal work of breathing, speaking in full sentences. No pain with inspiration. No wheezing, rales.  Chest:     Comments: No chest wall tenderness.  Musculoskeletal:        General: No deformity. Normal range of motion.     Cervical back: Normal range of motion and neck supple.  Skin:    General: Skin is warm and dry.     Capillary Refill: Capillary refill takes less than 2 seconds.  Neurological:     Mental Status: She is alert and oriented to person, place, and time.  Psychiatric:        Behavior: Behavior normal.        Thought Content: Thought content normal.        Judgment: Judgment normal.     ED Results / Procedures / Treatments   Labs (all labs ordered are listed, but only abnormal results are displayed) Labs Reviewed  BASIC METABOLIC PANEL - Abnormal; Notable for the following components:      Result Value   CO2 21 (*)    Creatinine, Ser 1.16 (*)    GFR calc  non Af Amer 52 (*)    All other components within normal limits  CBC - Abnormal; Notable for the following components:   MCV 102.8 (*)    MCH 34.3 (*)    All other components within normal limits  HEPATIC FUNCTION PANEL  LIPASE, BLOOD  I-STAT BETA HCG BLOOD, ED ([facility], [facility], [facility] ONLY)  TROPONIN I (HIGH SENSITIVITY)  TROPONIN I (HIGH SENSITIVITY)    EKG EKG Interpretation  Date/Time:  Wednesday May 26 2020 16:28:13 EDT Ventricular Rate:  94 PR Interval:  140 QRS Duration: 72 QT Interval:  354 QTC Calculation: 442 R Axis:   74 Text Interpretation: Normal sinus rhythm Normal ECG Confirmed by Carmin Muskrat [phone]) on 05/27/2020 8:12:40 AM   Radiology DG Chest 2 View  Result Date: 05/26/2020 CLINICAL DATA:  Shortness of breath and rest x2 months. EXAM: CHEST - 2 VIEW COMPARISON:  Apr 08, 2020 FINDINGS: Mild atelectasis and/or infiltrate is again seen within the mid and lower right lung. This is very mildly increased in severity when compared to the prior study. A very small right pleural effusion is suspected. There is no evidence of a pneumothorax.  Radiopaque surgical clips are again seen overlying the lateral aspect of the mid right hemithorax. Stable prominence of the right hilum is seen. The heart size and mediastinal contours are otherwise within normal limits. The visualized skeletal structures are unremarkable. IMPRESSION: 1. Mild mid and lower right lung atelectasis and/or infiltrate, mildly increased in severity when compared to the prior study. 2. Very small right pleural effusion. 3. Stable prominence of the right hilum. The presence of an underlying lung mass cannot be excluded. CT correlation is recommended. Electronically Signed   By: Virgina Norfolk M.D.   On: 05/26/2020 17:45   CT Chest W Contrast  Result Date: 05/27/2020 CLINICAL DATA:  Shortness of breath. Right hilar fullness on recent chest x-ray. EXAM: CT CHEST WITH CONTRAST TECHNIQUE: Multidetector CT imaging of the  chest was performed during intravenous contrast administration. CONTRAST:  84mL OMNIPAQUE IOHEXOL 300 MG/ML  SOLN COMPARISON:  Chest x-ray 05/26/2020.  Chest CT 07/27/2017. FINDINGS: Cardiovascular: The heart size is normal. No substantial pericardial effusion. No thoracic aortic aneurysm. Mediastinum/Nodes: Mediastinal lymphadenopathy evident. Bulky 2.3 cm short axis right paratracheal node visible on image 44/series 4. Associated right hilar lymphadenopathy noted with 2.5 cm short axis right hilar node on 57/4. Index subcarinal lymph node measures 16 mm short axis on 65/4. The esophagus has normal imaging features. Surgical clips noted right axilla. Multiple nodular soft tissue lesions are noted in the breasts bilaterally. Lungs/Pleura: Subpleural reticulation/scarring in the right upper lobe is similar to prior CT although slightly more confluent along its cranial aspect today. There are multiple new pleural and perifissural nodules in the right lung on today's study (see major fissure on image 60/3, and 8 mm nodule along the minor fissure on 64/3). Soft tissue windows show pleural nodularity in the posterior right costophrenic sulcus. No suspicious pulmonary nodule or mass in the left lung. Small right pleural effusion is new since prior CT. Upper Abdomen: Tiny hypodensities in the left liver stable since prior CT suggesting benign etiology such as cyst. Gallbladder is decompressed. No adrenal nodule or mass. Musculoskeletal: No worrisome lytic or sclerotic osseous abnormality. Skin thickening noted over the right breast, similar to prior. IMPRESSION: 1. Bulky mediastinal and right hilar lymphadenopathy with multiple new pleural and perifissural nodules in the right lung, notably in the posterior right costophrenic sulcus with new small right pleural effusion. Imaging features are highly suspicious for metastatic disease. 2. Multiple nodular soft tissue lesions in the breasts bilaterally. Metastatic disease a  concern. Given the history of right breast cancer, this would be a likely primary source although right sided lung cancer is not excluded. 3. Subpleural reticulation/scarring in the right upper lobe is similar to prior CT although slightly more confluent along its cranial aspect. 4. Aortic Atherosclerosis (ICD10-I70.0). Electronically Signed   By: Misty Stanley M.D.   On: 05/27/2020 09:44    Procedures Procedures (including critical care time)  Medications Ordered in ED Medications  iohexol (OMNIPAQUE) 300 MG/ML solution 75 mL (75 mLs Intravenous Contrast Given 05/27/20 8127)    ED Course  I have reviewed the triage vital signs and the nursing notes.  Pertinent labs & imaging results that were available during my care of the patient were reviewed by me and considered in my medical decision making (see chart for details).  Clinical Course as of May 27 1144  Thu May 27, 2020  0754 IMPRESSION: 1. Mild mid and lower right lung atelectasis and/or infiltrate, mildly increased in severity when compared to the prior study. 2.  Very small right pleural effusion. 3. Stable prominence of the right hilum. The presence of an underlying lung mass cannot be excluded. CT correlation is recommended.  DG Chest 2 View [CG]    Clinical Course User Index [CG] Kinnie Feil, PA-C   MDM Rules/Calculators/A&P                           This patient complains of shortness of breath for 2-3 months.  This is in setting of active breast cancer on oral medicines s/p right lumpectomies, recent GI issues.    I obtained additional history from previous medical records available.  Nursing notes reviewed to obtain more history and assist with MDM  In summary patient seen by GI for epigastric abdominal pain. She is on carafate and protonix. She is had CXR that showed possible mass on right side.  Has been unable to obtain follow up CT as recommended by oncologist due to insurance.    Chief complain involves an  extensive number of treatment options and is a complaint that carries with it a high risk of complications and morbidity and mortality  Highest on differential diagnosis unfortunately is metastatic breast cancer to lung.  Given chronicity of symptoms, lack of cough and fever doubt infiltrate or infection.  Lower on differential diagnosis is PE.  Cancer risk for PE but she has no tachcardia, tachypnea, hypoxia or chest pain.  No signs of hypervolemia and no history of CHF, this is less likely. Doubt ACS or angina given overall presentation.   Patient has been in waiting room for almost 15 hours.  ER work up initiated in triage last night.   ER work up personally reviewed, visualized and interpreted.  ER work up thus far reveals ?mid/lower right lung atelectasis vs infiltrate, possible effusion, possible mass on right hilum.  EKG non ischemic, unchanged. Trop undetectable. No leukocytosis. Normal electrolytes.   Given history of cancer, difficulty obtaining CT as OP, and concern for life threatening process and cancer progression I have ordered CT of chest here.  Discussed with EDP  1145: Patient reevaluated and reports ongoing subjective shortness of breath, placed on 2 L [place] by RN for comfort.  CT scan personally visualized and interpreted, this was discussed at length with patient.  She is aware findings of metastases.  Discussed follow-up with oncology for further evaluation, staging and prognosis.  Return precautions discussed.  She is comfortable with this.    Final Clinical Impression(s) / ED Diagnoses Final diagnoses:  Abnormal CT of the chest    Rx / DC Orders ED Discharge Orders    None       Arlean Hopping 05/27/20 1145    Carmin Muskrat, MD 05/27/20 1537

## 2020-05-27 NOTE — Telephone Encounter (Signed)
FYI- patient called to notify Dr. Amil Amen that her breast cancer has come back and metastasized to her lung as well. States she can look in her chart and see results from ER.  To Dr. Amil Amen for further direction.

## 2020-05-27 NOTE — ED Notes (Signed)
Pt stated she is having increased SOB especially when trying to sleep. Pt SpO2 is 96% RA Placed on 2 liters for comfort.

## 2020-05-27 NOTE — ED Notes (Signed)
Pt returned from CT °

## 2020-05-27 NOTE — Discharge Instructions (Addendum)
You were seen in the ER for shortness of breath.  CT scan today shows evidence of metastases with nodules in your lungs  Please follow-up with your oncologist for ongoing evaluation and discussion of treatment

## 2020-05-27 NOTE — Telephone Encounter (Signed)
Called and spoke with patient:  Shares that ED personnel shared with her a problem in the system when ordering testing with people with Medicaid and Medicare--stating a person has Part B when they don't and that's why we could not get the CT ordered through Medicaid.   States she never heard back from Medicare when tried to call and find out why it was stating she had Medicare Part B.  Discussed CT findings with her.  She states Dr. Virgie Dad nurse is already aware and she expects he will be in contact with her shortly.  She is to call if she does not hear from his office by Monday.

## 2020-05-27 NOTE — Progress Notes (Unsigned)
I called the niece and reviewed the results of the scan with her.  Her primary care doctor Dr. Amil Amen already had called her.  We are going to start with breast biopsies.  If those are negative we will do a bronchoscopy.  She is agreeable to this plan.

## 2020-05-31 ENCOUNTER — Telehealth: Payer: Self-pay | Admitting: Internal Medicine

## 2020-05-31 NOTE — Telephone Encounter (Signed)
Spoke with [name] and they were able to move up her mammogram and possible biopsy for this Wednesday, June 30th.  She needs to be there at 9 a.m.  [name] was notified and will be there

## 2020-06-02 ENCOUNTER — Ambulatory Visit
Admission: RE | Admit: 2020-06-02 | Discharge: 2020-06-02 | Disposition: A | Discharge status: Home / Self Care | Payer: Medicaid Other | Source: Ambulatory Visit | Attending: Oncology | Admitting: Oncology

## 2020-06-02 ENCOUNTER — Other Ambulatory Visit: Payer: Medicaid Other

## 2020-06-02 ENCOUNTER — Other Ambulatory Visit: Payer: Self-pay

## 2020-06-02 ENCOUNTER — Ambulatory Visit: Payer: Medicaid Other

## 2020-06-02 ENCOUNTER — Other Ambulatory Visit: Payer: Self-pay | Admitting: Internal Medicine

## 2020-06-02 ENCOUNTER — Other Ambulatory Visit: Payer: Self-pay | Admitting: Oncology

## 2020-06-02 DIAGNOSIS — C50411 Malignant neoplasm of upper-outer quadrant of right female breast: Secondary | ICD-10-CM

## 2020-06-02 DIAGNOSIS — Z170 Estrogen receptor positive status [ER+]: Secondary | ICD-10-CM

## 2020-06-03 ENCOUNTER — Inpatient Hospital Stay: Payer: Medicare Other | Attending: Oncology | Admitting: Medical

## 2020-06-03 ENCOUNTER — Other Ambulatory Visit: Payer: Self-pay

## 2020-06-03 ENCOUNTER — Ambulatory Visit (HOSPITAL_COMMUNITY)
Admission: RE | Admit: 2020-06-03 | Discharge: 2020-06-03 | Disposition: A | Discharge status: Home / Self Care | Payer: Medicaid Other | Source: Ambulatory Visit | Attending: Medical | Admitting: Medical

## 2020-06-03 ENCOUNTER — Other Ambulatory Visit: Payer: Self-pay | Admitting: *Deleted

## 2020-06-03 ENCOUNTER — Telehealth: Payer: Self-pay | Admitting: *Deleted

## 2020-06-03 VITALS — BP 143/98 | HR 91 | Temp 98.1°F | Resp 18 | Ht 64.0 in | Wt 226.1 lb

## 2020-06-03 DIAGNOSIS — R0602 Shortness of breath: Secondary | ICD-10-CM

## 2020-06-03 DIAGNOSIS — C773 Secondary and unspecified malignant neoplasm of axilla and upper limb lymph nodes: Secondary | ICD-10-CM | POA: Diagnosis present

## 2020-06-03 DIAGNOSIS — Z79811 Long term (current) use of aromatase inhibitors: Secondary | ICD-10-CM | POA: Diagnosis not present

## 2020-06-03 DIAGNOSIS — Z79899 Other long term (current) drug therapy: Secondary | ICD-10-CM | POA: Diagnosis not present

## 2020-06-03 DIAGNOSIS — Z87891 Personal history of nicotine dependence: Secondary | ICD-10-CM | POA: Diagnosis not present

## 2020-06-03 DIAGNOSIS — Z7951 Long term (current) use of inhaled steroids: Secondary | ICD-10-CM | POA: Diagnosis not present

## 2020-06-03 DIAGNOSIS — C50411 Malignant neoplasm of upper-outer quadrant of right female breast: Secondary | ICD-10-CM | POA: Diagnosis present

## 2020-06-03 DIAGNOSIS — M1990 Unspecified osteoarthritis, unspecified site: Secondary | ICD-10-CM | POA: Diagnosis not present

## 2020-06-03 DIAGNOSIS — Z170 Estrogen receptor positive status [ER+]: Secondary | ICD-10-CM | POA: Insufficient documentation

## 2020-06-03 DIAGNOSIS — Z7952 Long term (current) use of systemic steroids: Secondary | ICD-10-CM | POA: Insufficient documentation

## 2020-06-03 DIAGNOSIS — R918 Other nonspecific abnormal finding of lung field: Secondary | ICD-10-CM | POA: Diagnosis present

## 2020-06-03 DIAGNOSIS — C801 Malignant (primary) neoplasm, unspecified: Secondary | ICD-10-CM | POA: Insufficient documentation

## 2020-06-03 DIAGNOSIS — R05 Cough: Secondary | ICD-10-CM | POA: Insufficient documentation

## 2020-06-03 DIAGNOSIS — C50919 Malignant neoplasm of unspecified site of unspecified female breast: Secondary | ICD-10-CM | POA: Diagnosis present

## 2020-06-03 DIAGNOSIS — Z8052 Family history of malignant neoplasm of bladder: Secondary | ICD-10-CM | POA: Insufficient documentation

## 2020-06-03 DIAGNOSIS — Z9221 Personal history of antineoplastic chemotherapy: Secondary | ICD-10-CM | POA: Insufficient documentation

## 2020-06-03 DIAGNOSIS — R059 Cough, unspecified: Secondary | ICD-10-CM

## 2020-06-03 DIAGNOSIS — M810 Age-related osteoporosis without current pathological fracture: Secondary | ICD-10-CM | POA: Insufficient documentation

## 2020-06-03 DIAGNOSIS — R0609 Other forms of dyspnea: Secondary | ICD-10-CM | POA: Diagnosis not present

## 2020-06-03 DIAGNOSIS — R0600 Dyspnea, unspecified: Secondary | ICD-10-CM

## 2020-06-03 DIAGNOSIS — R519 Headache, unspecified: Secondary | ICD-10-CM | POA: Insufficient documentation

## 2020-06-03 DIAGNOSIS — R531 Weakness: Secondary | ICD-10-CM | POA: Insufficient documentation

## 2020-06-03 DIAGNOSIS — J3489 Other specified disorders of nose and nasal sinuses: Secondary | ICD-10-CM | POA: Insufficient documentation

## 2020-06-03 DIAGNOSIS — Z923 Personal history of irradiation: Secondary | ICD-10-CM | POA: Diagnosis not present

## 2020-06-03 DIAGNOSIS — K219 Gastro-esophageal reflux disease without esophagitis: Secondary | ICD-10-CM | POA: Diagnosis not present

## 2020-06-03 MED ORDER — PREDNISONE 5 MG PO TABS: ORAL_TABLET | ORAL | 0 refills | Status: DC

## 2020-06-03 MED ORDER — SULFAMETHOXAZOLE-TRIMETHOPRIM 800-160 MG PO TABS: 1.0000 | ORAL_TABLET | Freq: Two times a day (BID) | ORAL | 0 refills | Status: DC

## 2020-06-03 MED ORDER — FLUTICASONE PROPIONATE HFA 110 MCG/ACT IN AERO: 2.0000 | INHALATION_SPRAY | Freq: Two times a day (BID) | RESPIRATORY_TRACT | 12 refills | Status: DC

## 2020-06-03 MED ORDER — GUAIFENESIN-CODEINE 100-10 MG/5ML PO SOLN: 5.0000 mL | ORAL | 0 refills | Status: DC | PRN

## 2020-06-03 MED ORDER — BENZONATATE 200 MG PO CAPS: 200.0000 mg | ORAL_CAPSULE | Freq: Three times a day (TID) | ORAL | 1 refills | Status: DC | PRN

## 2020-06-03 NOTE — Patient Instructions (Signed)

## 2020-06-03 NOTE — Telephone Encounter (Signed)
This RN spoke with pt today per her call stating continued issues occurring with shortness of breath.  Note pt had severe episode yesterday when she was at the [facility] for work up for new nodules in chest /lymphnodes.  Per yesterday after getting home and resting symptoms lessened - but she is noticing return with activity and is concerned due to pending long weekend.  She is very reluctant to return to the ER due to prior visit taking 13+ hours to be seen without good outcome.  This RN spoke with nurse with [facility] with recommendation for the patient to have a CXR and then visit.  This RN called pt with above who is in agreement- order entered for CXR 2 view Stat reading- and LOS sent for scheduling per urgent request.

## 2020-06-09 ENCOUNTER — Other Ambulatory Visit: Payer: Self-pay | Admitting: Oncology

## 2020-06-09 DIAGNOSIS — C50411 Malignant neoplasm of upper-outer quadrant of right female breast: Secondary | ICD-10-CM

## 2020-06-09 NOTE — Progress Notes (Signed)
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 was encouraged to call  [facility] for any additional concerns. My direct phone number was provided for the patient. Surgical consultation has been arranged with Dr. Autumn Messing at [facility] on June 14, 2020. Wilber Bihari, NP at [facility] was notified of biopsy results via EPIC message on June 03, 2020. Pathology results reported by Terie Purser, RN on 06/03/2020. Electronically Signed   By: Claudie Revering M.D.   On: 06/03/2020 13:22   Result Date: 06/03/2020 CLINICAL DATA:  1.5 cm indeterminate mass in the 10 o'clock position of the right breast at mammography and ultrasound earlier today. EXAM: ULTRASOUND GUIDED RIGHT BREAST CORE NEEDLE BIOPSY COMPARISON:  Previous exam(s). PROCEDURE: I met with the patient and we discussed the procedure of ultrasound-guided biopsy, including benefits and alternatives. We discussed the high likelihood of a successful procedure. We discussed the  risks of the procedure, including infection, bleeding, tissue injury, clip migration, and inadequate sampling. Informed written consent was given. The usual time-out protocol was performed immediately prior to the procedure. Lesion quadrant: Upper outer quadrant Using sterile technique and 1% Lidocaine as local anesthetic, under direct ultrasound visualization, a 12 gauge spring-loaded device was used to perform biopsy of the 1.5 cm indeterminate mass seen in the 10 o'clock position of the right breast, 8 cm from the nipple, at ultrasound earlier today, using a caudal approach. At the conclusion of the procedure a ribbon shaped tissue marker clip was deployed into the biopsy cavity. Follow up 2 view mammogram was performed and dictated separately. IMPRESSION: Ultrasound guided biopsy of 1.5 cm indeterminate mass seen in the 10 o'clock position of the right breast earlier today. No apparent complications. Electronically Signed: By: Claudie Revering M.D. On: 06/02/2020 16:01

## 2020-06-11 ENCOUNTER — Telehealth: Payer: Self-pay

## 2020-06-11 ENCOUNTER — Other Ambulatory Visit: Payer: Medicaid Other

## 2020-06-11 ENCOUNTER — Other Ambulatory Visit: Payer: Self-pay

## 2020-06-11 ENCOUNTER — Telehealth: Payer: Self-pay | Admitting: Oncology

## 2020-06-11 ENCOUNTER — Inpatient Hospital Stay (HOSPITAL_BASED_OUTPATIENT_CLINIC_OR_DEPARTMENT_OTHER): Payer: Medicare Other | Admitting: Oncology

## 2020-06-11 VITALS — BP 132/91 | HR 117 | Temp 98.5°F | Resp 18 | Ht 64.0 in | Wt 220.5 lb

## 2020-06-11 DIAGNOSIS — G629 Polyneuropathy, unspecified: Secondary | ICD-10-CM | POA: Diagnosis not present

## 2020-06-11 DIAGNOSIS — C773 Secondary and unspecified malignant neoplasm of axilla and upper limb lymph nodes: Secondary | ICD-10-CM | POA: Diagnosis not present

## 2020-06-11 DIAGNOSIS — C50919 Malignant neoplasm of unspecified site of unspecified female breast: Secondary | ICD-10-CM | POA: Diagnosis not present

## 2020-06-11 DIAGNOSIS — C50411 Malignant neoplasm of upper-outer quadrant of right female breast: Secondary | ICD-10-CM

## 2020-06-11 DIAGNOSIS — Z170 Estrogen receptor positive status [ER+]: Secondary | ICD-10-CM | POA: Diagnosis not present

## 2020-06-11 DIAGNOSIS — C801 Malignant (primary) neoplasm, unspecified: Secondary | ICD-10-CM

## 2020-06-11 MED ORDER — PALBOCICLIB 125 MG PO CAPS: 125.0000 mg | ORAL_CAPSULE | Freq: Every day | ORAL | 6 refills | Status: DC

## 2020-06-11 NOTE — Telephone Encounter (Signed)
Oral Oncology Patient Advocate Encounter  Received notification from [facility] that prior authorization for April [name] is required.  PA submitted on CoverMyMeds [place] Status is pending  Oral Oncology Clinic will continue to follow.  [place] [facility] [place] Phone [phone] Fax [phone] 06/11/2020 3:53 PM

## 2020-06-11 NOTE — Telephone Encounter (Signed)
Scheduled appts per 7/9 los. Gave pt a print out of AVS.

## 2020-06-11 NOTE — Progress Notes (Signed)
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 multiple new pleural and perifissural nodules in the right lung, highly suspicious metastatic disease. She also had a right lumpectomy for breast cancer in 2018, initially diagnosed in 2017 with metastatic right axillary adenopathy. She had preoperative neoadjuvant chemotherapy and postoperative radiation therapy. EXAM: DIGITAL DIAGNOSTIC BILATERAL MAMMOGRAM WITH CAD AND TOMO ULTRASOUND RIGHT BREAST COMPARISON:  Previous examinations, including the chest CT dated 05/27/2020. Her last bilateral diagnostic mammogram was on 07/30/2019. ACR Breast Density Category b: There are scattered areas of fibroglandular density. FINDINGS: There is a small, new area of asymmetrical density in the posterior aspect of the upper-outer right breast, adjacent to a previously demonstrated small, partially calcified oil cyst. This is anterior to the patient's lumpectomy site. Multiple bilateral partially calcified oil cysts in both breasts are unchanged. There are additional small nodular densities in  both breasts that are mammographically stable over many years, with benign densities. There are no additional new findings in either breast. Mammographic images were processed with CAD. On physical exam, there is an approximately 1.5 cm faintly palpable mass in the 10 o'clock position of the right breast, 8 cm from the nipple. Targeted ultrasound is performed, showing a 1.5 x 1.5 x 1.5 cm irregular, echogenic mass with indistinct margins in the 10 o'clock position of the right breast, 8 cm from the nipple. This has ill-defined central decreased echogenicity and mild posterior acoustical shadowing. This corresponds to the new mammographic asymmetry. Ultrasound of the right axilla demonstrates multiple surgical clips with no visible lymph nodes. IMPRESSION: 1. 1.5 cm new indeterminate mass in the 10 o'clock position of the right breast. 2. Multiple bilateral mammographically stable benign breast nodules, including multiple partially calcified oil cysts, corresponding to the other nodular densities seen on the recent chest CT. RECOMMENDATION: Ultrasound-guided core needle biopsy of the 1.5 cm indeterminate mass in the 10 o'clock position of the right breast. This has been discussed the patient and scheduled follow. I have discussed the findings and recommendations with the patient. If applicable, a reminder letter will be sent to the patient regarding the next appointment. BI-RADS CATEGORY  4: Suspicious. Electronically Signed   By: Claudie Revering M.D.   On: 06/02/2020 15:59   MM CLIP PLACEMENT RIGHT  Result Date: 06/03/2020 CLINICAL DATA:  Status post ultrasound-guided core needle biopsy of a 1.5 cm mass in the 10 o'clock position of the right breast. EXAM: DIAGNOSTIC RIGHT MAMMOGRAM POST ULTRASOUND BIOPSY COMPARISON:  Previous exam(s). FINDINGS: Mammographic images were obtained following ultrasound guided biopsy of the recently demonstrated 1.5 cm mass in the 10 o'clock position of the right breast. The biopsy  marking clip is in expected position at the site of biopsy. IMPRESSION: Appropriate positioning of the ribbon shaped shaped biopsy marking clip at the site of biopsy in the biopsied mass in the 10 o'clock position of the right breast. Final Assessment: Post Procedure Mammograms for Marker Placement Electronically Signed   By: Claudie Revering M.D.   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 This was found to be concordant by Dr. Claudie Revering. Pathology results were discussed  with the patient by telephone. The patient reported doing well after the biopsy with tenderness at the site. Post biopsy instructions and care were reviewed and questions were answered. The patient was encouraged to call [facility] for any additional concerns. My direct phone number was provided for the patient. Surgical consultation has been arranged with Dr. Autumn Messing at [facility] on June 14, 2020. Wilber Bihari, NP at [facility] was notified of biopsy results via EPIC message on June 03, 2020. Pathology results reported by Terie Purser, RN on 06/03/2020. Electronically Signed   By: Claudie Revering M.D.   On: 06/03/2020 13:22   Result Date: 06/03/2020 CLINICAL DATA:  1.5 cm indeterminate mass in the 10 o'clock position of the right breast at mammography and ultrasound earlier today. EXAM: ULTRASOUND GUIDED RIGHT BREAST CORE NEEDLE BIOPSY COMPARISON:  Previous exam(s). PROCEDURE: I met with the patient and we discussed the procedure of ultrasound-guided biopsy, including benefits and alternatives. We discussed the high likelihood of a successful procedure. We discussed the risks of the procedure, including infection, bleeding, tissue injury, clip migration, and inadequate sampling. Informed  written consent was given. The usual time-out protocol was performed immediately prior to the procedure. Lesion quadrant: Upper outer quadrant Using sterile technique and 1% Lidocaine as local anesthetic, under direct ultrasound visualization, a 12 gauge spring-loaded device was used to perform biopsy of the 1.5 cm indeterminate mass seen in the 10 o'clock position of the right breast, 8 cm from the nipple, at ultrasound earlier today, using a caudal approach. At the conclusion of the procedure a ribbon shaped tissue marker clip was deployed into the biopsy cavity. Follow up 2 view mammogram was performed and dictated separately. IMPRESSION: Ultrasound guided biopsy of 1.5 cm indeterminate mass seen in the 10 o'clock position of the right breast earlier today. No apparent complications. Electronically Signed: By: Claudie Revering M.D. On: 06/02/2020 16:01      ASSESSMENT: 59 y.o. [place] woman status post right breast upper outer quadrant and right axillary lymph node biopsy 06/21/2016, both positive for a clinical T2 N1,stage IIIA  invasive ductal carcinoma, grade 3, estrogen and progesterone receptor positive, HER-2 nonamplified, with an MIB-1 between 20 and 25%   (1) neoadjuvant chemotherapy consisting of doxorubicin and cyclophosphamide in dose dense fashion 4, starting 07/17/2016, followed by weekly paclitaxel 12  (a) cyclophosphamide/doxorubicin interrupted after 2 cycles because of repeated febrile neutropenia episodes  (b) started weekly paclitaxel 08/23/2016  (c) paclitaxel discontinued after 7 cycles because of neuropathy: last dose 10/04/2016  (c) she received her final 2 cycles of cyclophosphamide and doxorubicin 10/23/2016 and 11/06/2016  (2) status post right lumpectomy and sentinel lymph node sampling 12/19/2016 for a residual mpT1c pN2 invasive ductal carcinoma grade 2, with negative margins  (a) completion axillary dissection 12/26/2016 found one additional of 20 removed lymph nodes to  be involved by tumor (total 3/22 lymph nodes positive)  (3) adjuvant radiation 02/07/17 - 03/21/17 : Right Breast and Nodes treated to 50 Gy in 25 fractions. Right Breast boosted an additional 10 Gy in 5 fractions.  (4) started anastrozole early part of May 2018  (a) bone density 03/23/2017 finds a T score of -2.6, osteoporosis.  (b) to start denosumab/Prolia after dental clearance (scheduled for extraction)  (c) anastrozole held 01/15/2018 for possible side effects, changed to exemestane  (5) on [name] trial, signed consent 05/30/2017, randomized to hormone therapy alone  (6) exemestane started 02/11/2018  (a) bone density on  03/23/2017 shows osteoporosis, T score of -2.6 in the [facility] spine  (b) started Prolia/denosumab 12/17/2018  (c) exemestane discontinued July 2021 with evidence of progression  (7) CT of the abdomen and pelvis obtained 11/28/2018 to evaluate for appendicitis showed no evidence of metastatic disease  METASTATIC DISEASE June 2021 (8) chest CT scan 05/27/2020 shows bulky mediastinal and right hilar lymphadenopathy with right pleural nodules and a small right pleural effusion, no evidence of liver or bone involvement  (a) biopsy of right breast mass 06/02/2020 shows invasive ductal carcinoma, estrogen and progesterone receptor positive, HER-2 not amplified, with an MIB-1 of 40%  (9) fulvestrant to start 06/17/2020  (a) palbociclib to start 06/17/2020 at 125 mg daily 21 days on 7 days off   PLAN: Eloise now has metastatic disease.  Almost certainly this is going to be breast cancer per the repeat biopsy of the right breast.  She is a prior smoker and there is a small possibility this could be a second cancer involving the right lung.  The plan is to start treatment and if we do not see a response in the lung area consider a biopsy.  She understands that stage IV breast cancer is not curable with our current knowledge base. The goal of treatment is control. The strategy of  treatment is to do only the minimum necessary to control the growth of the tumor so that the patient can have as normal a life as possible. There is no survival advantage in treating aggressively if treating less aggressively results in tumor control. With this strategy stage IV breast cancer in many cases can function as a "chronic illness": something that cannot be quite gotten rid of but can be controlled for an indefinite period of time  We reviewed advanced directives and she does have a healthcare power of attorney in place namely her daughter.  This was confirmed  We are going to treat her with Faslodex and palbociclib.  We discussed the possible toxicities side effects and complications of each of these agents.  We would like to start treatment 06/17/2020.  We will follow her lab work monthly as well.  I am going to go ahead and obtain a bone scan to complete her staging.   Total encounter time 40 minutes.*   Yatziry Deakins, Virgie Dad, MD  06/11/20 1:08 PM Medical Oncology and Hematology [facility] [address] [place], [place] [address] Tel. [phone]    Fax. [phone]   I, Wilburn Mylar, am acting as scribe for Dr. Virgie Dad. Bethannie Iglehart.  I, Lurline Del MD, have reviewed the above documentation for accuracy and completeness, and I agree with the above.

## 2020-06-14 ENCOUNTER — Telehealth: Payer: Self-pay | Admitting: Pharmacist

## 2020-06-14 ENCOUNTER — Other Ambulatory Visit: Payer: Self-pay | Admitting: Oncology

## 2020-06-14 DIAGNOSIS — C50411 Malignant neoplasm of upper-outer quadrant of right female breast: Secondary | ICD-10-CM

## 2020-06-14 DIAGNOSIS — Z170 Estrogen receptor positive status [ER+]: Secondary | ICD-10-CM

## 2020-06-14 MED ORDER — PALBOCICLIB 125 MG PO TABS: 125.0000 mg | ORAL_TABLET | Freq: Every day | ORAL | 6 refills | Status: DC

## 2020-06-14 NOTE — Telephone Encounter (Signed)
Oral Oncology Pharmacist Encounter  Received new prescription for Ibrance (palbociclib) for the treatment of metastatic breast cancer ER/PR positive, HER2 negative in conjunction with fulvestrant, planned duration until disease progression or unacceptable drug toxicity. Planned start 06/17/20  CMP from 03/12/20 assessed, no relevant lab abnormalities. Repeat labs ordered. Prescription dose and frequency assessed.   Current medication list in Epic reviewed, one relevant DDIs with palbociclib identified: -Echinacea may diminish the therapeutic effect of palbociclib, recommend discontinuing the echinacea.  Prescription has been e-scribed to the [facility] for benefits analysis and approval.  Oral Oncology Clinic will continue to follow for insurance authorization, copayment issues, initial counseling and start date.   N. , PharmD, BCPS, BCOP, CPP Hematology/Oncology Clinical Pharmacist Practitioner [facility] [phone]  06/14/2020 8:30 AM  

## 2020-06-16 NOTE — Telephone Encounter (Signed)
Oral Chemotherapy Pharmacist Encounter  Patient plans on picking up her [facility] from [facility] tomorrow 06/17/20.  Patient Education I spoke with patient for overview of new oral chemotherapy medication: Ibrance (palbociclib) for the treatment of metastatic breast cancer ER/PR positive, HER2 negative in conjunction with fulvestrant, planned duration until disease progression or unacceptable drug toxicity. Planned start 06/17/20.   Pt is doing well. Counseled patient on administration, dosing, side effects, monitoring, drug-food interactions, safe handling, storage, and disposal. Patient will take 1 tablet (125 mg total) by mouth daily. Take for 21 days on, 7 days off, repeat every 28 days.  Reviewed echinacea DDI with patient, she reports that she has not taken it in a year and is okay with stopping it. Medication removed from her med list.  Side effects include but not limited to: decreased wbc, fatigue, N/V, hair thinning.    Reviewed with patient importance of keeping a medication schedule and plan for any missed doses.  [name] voiced understanding and appreciation. All questions answered. Medication handout emailed to patient.  Provided patient with [facility] phone number. Patient knows to call the office with questions or concerns. Oral Chemotherapy Navigation Clinic will continue to follow.  Darl Pikes, PharmD, BCPS, BCOP, CPP Hematology/Oncology Clinical Pharmacist Practitioner [facility] [phone]  06/16/2020 11:29 AM

## 2020-06-17 ENCOUNTER — Encounter: Payer: Self-pay | Admitting: *Deleted

## 2020-06-17 ENCOUNTER — Ambulatory Visit (HOSPITAL_COMMUNITY)
Admission: RE | Admit: 2020-06-17 | Discharge: 2020-06-17 | Disposition: A | Discharge status: Home / Self Care | Payer: Medicare Other | Source: Ambulatory Visit | Attending: Adult Health | Admitting: Adult Health

## 2020-06-17 ENCOUNTER — Inpatient Hospital Stay (HOSPITAL_BASED_OUTPATIENT_CLINIC_OR_DEPARTMENT_OTHER): Payer: Medicare Other | Admitting: Adult Health

## 2020-06-17 ENCOUNTER — Other Ambulatory Visit: Payer: Self-pay

## 2020-06-17 ENCOUNTER — Inpatient Hospital Stay: Payer: Medicare Other

## 2020-06-17 VITALS — BP 119/95 | HR 91 | Temp 98.9°F | Resp 18 | Ht 64.0 in | Wt 220.7 lb

## 2020-06-17 DIAGNOSIS — Z170 Estrogen receptor positive status [ER+]: Secondary | ICD-10-CM | POA: Diagnosis present

## 2020-06-17 DIAGNOSIS — M810 Age-related osteoporosis without current pathological fracture: Secondary | ICD-10-CM

## 2020-06-17 DIAGNOSIS — Z006 Encounter for examination for normal comparison and control in clinical research program: Secondary | ICD-10-CM

## 2020-06-17 DIAGNOSIS — C773 Secondary and unspecified malignant neoplasm of axilla and upper limb lymph nodes: Secondary | ICD-10-CM

## 2020-06-17 DIAGNOSIS — C50411 Malignant neoplasm of upper-outer quadrant of right female breast: Secondary | ICD-10-CM | POA: Diagnosis present

## 2020-06-17 DIAGNOSIS — C50919 Malignant neoplasm of unspecified site of unspecified female breast: Secondary | ICD-10-CM

## 2020-06-17 LAB — CBC WITH DIFFERENTIAL/PLATELET
Abs Immature Granulocytes: 0.02 10*3/uL (ref 0.00–0.07)
Basophils Absolute: 0 10*3/uL (ref 0.0–0.1)
Basophils Relative: 0 %
Eosinophils Absolute: 0.1 10*3/uL (ref 0.0–0.5)
Eosinophils Relative: 1 %
HCT: 42.4 % (ref 36.0–46.0)
Hemoglobin: 14.4 g/dL (ref 12.0–15.0)
Immature Granulocytes: 0 %
Lymphocytes Relative: 16 %
Lymphs Abs: 1.1 10*3/uL (ref 0.7–4.0)
MCH: 34.6 pg — ABNORMAL HIGH (ref 26.0–34.0)
MCHC: 34 g/dL (ref 30.0–36.0)
MCV: 101.9 fL — ABNORMAL HIGH (ref 80.0–100.0)
Monocytes Absolute: 0.5 10*3/uL (ref 0.1–1.0)
Monocytes Relative: 8 %
Neutro Abs: 4.8 10*3/uL (ref 1.7–7.7)
Neutrophils Relative %: 75 %
Platelets: 309 10*3/uL (ref 150–400)
RBC: 4.16 MIL/uL (ref 3.87–5.11)
RDW: 13.4 % (ref 11.5–15.5)
WBC: 6.5 10*3/uL (ref 4.0–10.5)
nRBC: 0 % (ref 0.0–0.2)

## 2020-06-17 LAB — COMPREHENSIVE METABOLIC PANEL
ALT: 15 U/L (ref 0–44)
AST: 18 U/L (ref 15–41)
Albumin: 3.9 g/dL (ref 3.5–5.0)
Alkaline Phosphatase: 117 U/L (ref 38–126)
Anion gap: 11 (ref 5–15)
BUN: 15 mg/dL (ref 6–20)
CO2: 24 mmol/L (ref 22–32)
Calcium: 9.8 mg/dL (ref 8.9–10.3)
Chloride: 104 mmol/L (ref 98–111)
Creatinine, Ser: 1.04 mg/dL — ABNORMAL HIGH (ref 0.44–1.00)
GFR calc Af Amer: >60 mL/min (ref >60)
GFR calc non Af Amer: 59 mL/min — ABNORMAL LOW (ref >60)
Glucose, Bld: 83 mg/dL (ref 70–99)
Potassium: 4.2 mmol/L (ref 3.5–5.1)
Sodium: 139 mmol/L (ref 135–145)
Total Bilirubin: 0.5 mg/dL (ref 0.3–1.2)
Total Protein: 7.6 g/dL (ref 6.5–8.1)

## 2020-06-17 LAB — RESEARCH LABS

## 2020-06-17 MED ORDER — FULVESTRANT 250 MG/5ML IM SOLN
500.0000 mg | Freq: Once | INTRAMUSCULAR | Status: AC
  Administered 2020-06-17: 500 mg via INTRAMUSCULAR

## 2020-06-17 MED ORDER — FULVESTRANT 250 MG/5ML IM SOLN
INTRAMUSCULAR | Status: AC
  Filled 2020-06-17: qty 10

## 2020-06-17 MED ORDER — DENOSUMAB 60 MG/ML ~~LOC~~ SOSY
60.0000 mg | PREFILLED_SYRINGE | Freq: Once | SUBCUTANEOUS | Status: AC
  Administered 2020-06-17: 60 mg via SUBCUTANEOUS

## 2020-06-17 NOTE — Progress Notes (Signed)
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 IMPRESSION: 1. 1.5 cm new indeterminate mass in the 10 o'clock position of the right breast. 2. Multiple  bilateral mammographically stable benign breast nodules, including multiple partially calcified oil cysts, corresponding to the other nodular densities seen on the recent chest CT. RECOMMENDATION: Ultrasound-guided core needle biopsy of the 1.5 cm indeterminate mass in the 10 o'clock position of the right breast. This has been discussed the patient and scheduled follow. I have discussed the findings and recommendations with the patient. If applicable, a reminder letter will be sent to the patient regarding the next appointment. BI-RADS CATEGORY  4: Suspicious. Electronically Signed   By: Claudie Revering M.D.   On: 06/02/2020 15:59   MM CLIP PLACEMENT RIGHT  Result Date: 06/03/2020 CLINICAL DATA:  Status post ultrasound-guided core needle biopsy of a 1.5 cm mass in the 10 o'clock position of the right breast. EXAM: DIAGNOSTIC RIGHT MAMMOGRAM POST ULTRASOUND BIOPSY  COMPARISON:  Previous exam(s). FINDINGS: Mammographic images were obtained following ultrasound guided biopsy of the recently demonstrated 1.5 cm mass in the 10 o'clock position of the right breast. The biopsy marking clip is in expected position at the site of biopsy. IMPRESSION: Appropriate positioning of the ribbon shaped shaped biopsy marking clip at the site of biopsy in the biopsied mass in the 10 o'clock position of the right breast. Final Assessment: Post Procedure Mammograms for Marker Placement Electronically Signed   By: Claudie Revering M.D.   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 This was found to be concordant by Dr. Claudie Revering. Pathology results were discussed with the patient by telephone. The patient reported doing well after the biopsy with tenderness at the site. Post biopsy instructions and care were reviewed and questions were answered. The patient was encouraged to call [facility] for any additional concerns. My direct phone number was provided for the patient. Surgical consultation has been arranged with Dr. Autumn Messing at [facility] on June 14, 2020. Wilber Bihari, NP at [facility] was notified of biopsy results via EPIC message on June 03, 2020. Pathology results reported by Terie Purser, RN on 06/03/2020. Electronically Signed   By: Claudie Revering M.D.   On: 06/03/2020 13:22   Result Date: 06/03/2020 CLINICAL DATA:  1.5 cm indeterminate mass in the 10 o'clock position of the right breast at mammography and ultrasound earlier today. EXAM: ULTRASOUND GUIDED RIGHT BREAST CORE NEEDLE BIOPSY COMPARISON:  Previous exam(s). PROCEDURE: I met with the patient and we discussed the procedure of ultrasound-guided biopsy, including benefits and  alternatives. We discussed the high likelihood of a successful procedure. We discussed the risks of the procedure, including infection, bleeding, tissue injury, clip migration, and inadequate sampling. Informed written consent was given. The usual time-out protocol was performed immediately prior to the procedure. Lesion quadrant: Upper outer quadrant Using sterile technique and 1% Lidocaine as local anesthetic, under direct ultrasound visualization, a 12 gauge spring-loaded device was used to perform biopsy of the 1.5 cm indeterminate mass seen in the 10 o'clock position of the right breast, 8 cm from the nipple, at ultrasound earlier today, using a caudal approach. At the conclusion of the procedure a ribbon shaped tissue marker clip was deployed into the biopsy cavity. Follow up 2 view mammogram was performed and dictated separately. IMPRESSION: Ultrasound guided biopsy of 1.5 cm indeterminate mass seen in the 10 o'clock position of  the right breast earlier today. No apparent complications. Electronically Signed: By: Claudie Revering M.D. On: 06/02/2020 16:01      ASSESSMENT: 59 y.o. [place] woman status post right breast upper outer quadrant and right axillary lymph node biopsy 06/21/2016, both positive for a clinical T2 N1,stage IIIA  invasive ductal carcinoma, grade 3, estrogen and progesterone receptor positive, HER-2 nonamplified, with an MIB-1 between 20 and 25%   (1) neoadjuvant chemotherapy consisting of doxorubicin and cyclophosphamide in dose dense fashion 4, starting 07/17/2016, followed by weekly paclitaxel 12  (a) cyclophosphamide/doxorubicin interrupted after 2 cycles because of repeated febrile neutropenia episodes  (b) started weekly paclitaxel 08/23/2016  (c) paclitaxel discontinued after 7 cycles 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 Right Breast boosted an additional 10 Gy in 5 fractions.  (4) started anastrozole early part of May 2018  (a) bone density 03/23/2017 finds a T score of -2.6, osteoporosis.  (b) to start denosumab/Prolia after dental clearance (scheduled for extraction)  (c) anastrozole held 01/15/2018 for possible side effects, changed to exemestane  (5) on [name] trial, signed consent 05/30/2017, randomized to hormone therapy alone  (6) exemestane started 02/11/2018  (a) bone density on 03/23/2017 shows osteoporosis, T score of -2.6 in the AP spine  (b) started Prolia/denosumab 12/17/2018  (c) exemestane discontinued July 2021 with evidence of progression  (7) CT of the abdomen and pelvis obtained 11/28/2018 to evaluate for appendicitis showed no evidence of metastatic disease  METASTATIC DISEASE June 2021 (8) chest CT scan 05/27/2020 shows bulky mediastinal and right hilar lymphadenopathy with right pleural nodules and a small right pleural effusion, no evidence of liver or bone involvement  (a) biopsy of right breast mass 06/02/2020 shows invasive ductal carcinoma, estrogen and progesterone receptor positive, HER-2 not amplified, with an MIB-1 of 40%  (9) fulvestrant to start 06/17/2020  (a) palbociclib to start 06/17/2020 at 125 mg daily 21 days on 7 days off   PLAN: April [name] and I discussed her metastatic breast cancer.  She will start treatment with Fulvestrant and Palbociclib.  We reviewed the Fulvestrant injection, which is two IM injections every 2 weeks x 3 initially, and then every 4 weeks thereafter.    She will also take Palbociclib.  Her labs today are stable.  I reviewed the potential for fatigue, cytopenias more commonly.  She knows to contact us for any issues that may arise, and to keep a journal of side effects.    The second issue is that of her pain.  She has an upcoming bone scan, however, I placed orders for stat plan films of the thoracic spine.  This will look at her vertebrae today (rather than next week) to evaluate for compression fracture, or bone lesion.  She is agreeable.  She will continue Aleve and Tylenol for her pain.  As she starts on treatment for her cancer, her pain should improve.   She understands this.  We also discussed the fact that Dr. Jana Hakim did not biopsy her lung tumor, as the other area  in her body that appeared to be cancerous was positive for breast cancer.  She will go ahead and start treatment, and after 3-4 cycles will undergo restaging, unless of course, there are concerns for progression or other reasons to obtain imaging prior to that time period.    We also discussed April [name]'s new regimen of juicing and clean eating.  Her daughter wonders if that can be helpful on its own.  Latria indicated that she desires to start treatment.  I let Calinda and her daughter know that eating clean is very helpful and will help her feel much improved, just because of all of the good vitamins and minerals she will be receiving.  I reviewed that she should continue to eat normal daily serving recommendations of her food groups, and she understands that.    Lillianna understands the above plan and is in agreement with it.  She will keep a symptom diary and will receive her injections today.  She will return in 2 weeks for labs, f/u, and her next injection.  She knows to call for any questions that may arise between now and her next appointment.  We are happy to see her sooner if needed.   Total encounter time 30 minutes.Wilber Bihari, NP 06/19/20 2:02 PM Medical Oncology and Hematology [facility] [place], [place] [address] Tel. [phone]    Fax. [phone]  *Total Encounter Time as defined by the Centers for Medicare and Medicaid Services includes, in addition to the face-to-face time of a patient visit (documented in the note above) non-face-to-face time: obtaining and reviewing outside history, ordering and reviewing medications, tests or procedures, care coordination (communications with other health care professionals or caregivers) and documentation in the medical record.

## 2020-06-17 NOTE — Research (Addendum)
[name] STUDY 36 months follow up assessment;  Patient in clinic today with her daughter, Jodi Mourning, prior to starting on new treatment (Ibrance and Fulvestrant) for a recurrence of her breast cancer.  Patient had recent biopsy which showed a recurrence of breast cancer in her right breast. See MD notes from 06/11/20. PROs; Given to patient at registration prior to lab appointment.  Collected and checked for accuracy and completeness by research nurse.  Labs; Research labs collected at time of recurrence per protocol.  ConMeds;  Patient reports she stopped taking Exemestane and last dose was on 06/14/20.  Provider Visit;  Patient seen by Wilber Bihari, NP.  See provider notes from today.  Covid19 Testing; Patient had a Negative Covid19 test completed earlier this year in February.  Patient denies any other Covid19 testing since last study visit.  Plan;  Patient will start Palbociclib and Fulvestrant today. She will move into Follow Up Phase II on the [name] Study with annual visits or phone calls to follow up.   Informed patient of yearly contact to follow up on study and she verbalized understanding.  Thanked patient for her participation in this clinical trial.  Foye Spurling, BSN, RN Clinical Research Nurse 06/17/2020 3:17 PM

## 2020-06-18 ENCOUNTER — Telehealth: Payer: Self-pay

## 2020-06-18 ENCOUNTER — Telehealth: Payer: Self-pay | Admitting: Adult Health

## 2020-06-18 LAB — CANCER ANTIGEN 27.29: CA 27.29: 284.9 U/mL — ABNORMAL HIGH (ref 0.0–38.6)

## 2020-06-18 NOTE — Telephone Encounter (Signed)
-----   Message from Gardenia Phlegm, NP sent at 06/17/2020  9:00 PM EDT ----- Xrays of the back are normal and show no cancer or compression fracture.  Good news. ----- Message ----- From: Interface, Rad Results In Sent: 06/17/2020   4:15 PM EDT To: Gardenia Phlegm, NP

## 2020-06-18 NOTE — Telephone Encounter (Signed)
Called pt w/below information per Wilber Bihari, NP. Pt voices concerns for ongoing ShOB, cough and pain across back. Advised pt to go to the ED if her ShOB worsens. Pt verbalized thanks and understanding.

## 2020-06-18 NOTE — Telephone Encounter (Signed)
Added office visit between appts that were already scheduled. Did not change arrival time.

## 2020-06-21 ENCOUNTER — Inpatient Hospital Stay: Payer: Medicare Other

## 2020-06-21 ENCOUNTER — Inpatient Hospital Stay: Payer: Medicare Other | Admitting: Oncology

## 2020-06-23 ENCOUNTER — Encounter (HOSPITAL_COMMUNITY)
Admission: RE | Admit: 2020-06-23 | Discharge: 2020-06-23 | Disposition: A | Discharge status: Home / Self Care | Payer: Medicare Other | Source: Ambulatory Visit | Attending: Oncology | Admitting: Oncology

## 2020-06-23 ENCOUNTER — Ambulatory Visit (HOSPITAL_COMMUNITY)
Admission: RE | Admit: 2020-06-23 | Discharge: 2020-06-23 | Disposition: A | Discharge status: Home / Self Care | Payer: Medicare Other | Source: Ambulatory Visit | Attending: Oncology | Admitting: Oncology

## 2020-06-23 ENCOUNTER — Other Ambulatory Visit: Payer: Self-pay

## 2020-06-23 DIAGNOSIS — C801 Malignant (primary) neoplasm, unspecified: Secondary | ICD-10-CM | POA: Insufficient documentation

## 2020-06-23 DIAGNOSIS — C50411 Malignant neoplasm of upper-outer quadrant of right female breast: Secondary | ICD-10-CM

## 2020-06-23 DIAGNOSIS — Z170 Estrogen receptor positive status [ER+]: Secondary | ICD-10-CM

## 2020-06-23 DIAGNOSIS — C50919 Malignant neoplasm of unspecified site of unspecified female breast: Secondary | ICD-10-CM

## 2020-06-23 DIAGNOSIS — G629 Polyneuropathy, unspecified: Secondary | ICD-10-CM | POA: Diagnosis present

## 2020-06-23 DIAGNOSIS — C773 Secondary and unspecified malignant neoplasm of axilla and upper limb lymph nodes: Secondary | ICD-10-CM | POA: Insufficient documentation

## 2020-06-23 MED ORDER — TECHNETIUM TC 99M MEDRONATE IV KIT
20.0000 | PACK | Freq: Once | INTRAVENOUS | Status: AC | PRN
  Administered 2020-06-23: 19 via INTRAVENOUS

## 2020-07-01 ENCOUNTER — Ambulatory Visit: Payer: Medicaid Other

## 2020-07-01 ENCOUNTER — Inpatient Hospital Stay: Payer: Medicare Other

## 2020-07-01 ENCOUNTER — Other Ambulatory Visit: Payer: Self-pay

## 2020-07-01 ENCOUNTER — Telehealth: Payer: Self-pay | Admitting: Clinical

## 2020-07-01 ENCOUNTER — Encounter: Payer: Self-pay | Admitting: Adult Health

## 2020-07-01 ENCOUNTER — Other Ambulatory Visit: Payer: Medicaid Other

## 2020-07-01 ENCOUNTER — Inpatient Hospital Stay (HOSPITAL_BASED_OUTPATIENT_CLINIC_OR_DEPARTMENT_OTHER): Payer: Medicare Other | Admitting: Adult Health

## 2020-07-01 VITALS — BP 118/82 | HR 93 | Temp 98.7°F | Resp 17 | Ht 64.0 in | Wt 219.4 lb

## 2020-07-01 DIAGNOSIS — C50411 Malignant neoplasm of upper-outer quadrant of right female breast: Secondary | ICD-10-CM | POA: Diagnosis not present

## 2020-07-01 DIAGNOSIS — C50919 Malignant neoplasm of unspecified site of unspecified female breast: Secondary | ICD-10-CM

## 2020-07-01 DIAGNOSIS — M810 Age-related osteoporosis without current pathological fracture: Secondary | ICD-10-CM

## 2020-07-01 DIAGNOSIS — C773 Secondary and unspecified malignant neoplasm of axilla and upper limb lymph nodes: Secondary | ICD-10-CM

## 2020-07-01 DIAGNOSIS — Z170 Estrogen receptor positive status [ER+]: Secondary | ICD-10-CM

## 2020-07-01 LAB — CBC WITH DIFFERENTIAL/PLATELET
Abs Immature Granulocytes: 0 10*3/uL (ref 0.00–0.07)
Basophils Absolute: 0 10*3/uL (ref 0.0–0.1)
Basophils Relative: 0 %
Eosinophils Absolute: 0 10*3/uL (ref 0.0–0.5)
Eosinophils Relative: 1 %
HCT: 39.6 % (ref 36.0–46.0)
Hemoglobin: 13.8 g/dL (ref 12.0–15.0)
Immature Granulocytes: 0 %
Lymphocytes Relative: 32 %
Lymphs Abs: 0.7 10*3/uL (ref 0.7–4.0)
MCH: 35.4 pg — ABNORMAL HIGH (ref 26.0–34.0)
MCHC: 34.8 g/dL (ref 30.0–36.0)
MCV: 101.5 fL — ABNORMAL HIGH (ref 80.0–100.0)
Monocytes Absolute: 0.1 10*3/uL (ref 0.1–1.0)
Monocytes Relative: 2 %
Neutro Abs: 1.4 10*3/uL — ABNORMAL LOW (ref 1.7–7.7)
Neutrophils Relative %: 65 %
Platelets: 192 10*3/uL (ref 150–400)
RBC: 3.9 MIL/uL (ref 3.87–5.11)
RDW: 13.2 % (ref 11.5–15.5)
WBC: 2.2 10*3/uL — ABNORMAL LOW (ref 4.0–10.5)
nRBC: 0 % (ref 0.0–0.2)

## 2020-07-01 LAB — COMPREHENSIVE METABOLIC PANEL
ALT: 10 U/L (ref 0–44)
AST: 14 U/L — ABNORMAL LOW (ref 15–41)
Albumin: 3.6 g/dL (ref 3.5–5.0)
Alkaline Phosphatase: 114 U/L (ref 38–126)
Anion gap: 8 (ref 5–15)
BUN: 11 mg/dL (ref 6–20)
CO2: 22 mmol/L (ref 22–32)
Calcium: 9.5 mg/dL (ref 8.9–10.3)
Chloride: 109 mmol/L (ref 98–111)
Creatinine, Ser: 1.26 mg/dL — ABNORMAL HIGH (ref 0.44–1.00)
GFR calc Af Amer: 54 mL/min — ABNORMAL LOW (ref >60)
GFR calc non Af Amer: 47 mL/min — ABNORMAL LOW (ref >60)
Glucose, Bld: 102 mg/dL — ABNORMAL HIGH (ref 70–99)
Potassium: 3.9 mmol/L (ref 3.5–5.1)
Sodium: 139 mmol/L (ref 135–145)
Total Bilirubin: 0.5 mg/dL (ref 0.3–1.2)
Total Protein: 7.1 g/dL (ref 6.5–8.1)

## 2020-07-01 MED ORDER — FULVESTRANT 250 MG/5ML IM SOLN
INTRAMUSCULAR | Status: AC
  Filled 2020-07-01: qty 5

## 2020-07-01 MED ORDER — FULVESTRANT 250 MG/5ML IM SOLN
500.0000 mg | Freq: Once | INTRAMUSCULAR | Status: AC
  Administered 2020-07-01: 500 mg via INTRAMUSCULAR

## 2020-07-01 NOTE — Telephone Encounter (Signed)
Patient would like to know if any bills have not been been paid as she is in process of getting medicaid approval

## 2020-07-01 NOTE — Progress Notes (Signed)
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 was provided  for the patient. Surgical consultation has been arranged with Dr. Autumn Messing at [facility] on June 14, 2020. Wilber Bihari, NP at [facility] was notified of biopsy results via EPIC message on June 03, 2020. Pathology results reported by Terie Purser, RN on 06/03/2020. Electronically Signed   By: Claudie Revering M.D.   On: 06/03/2020 13:22   Result Date: 06/03/2020 CLINICAL DATA:  1.5 cm indeterminate mass in the 10 o'clock position of the right breast at mammography and ultrasound earlier today. EXAM: ULTRASOUND GUIDED RIGHT BREAST CORE NEEDLE BIOPSY COMPARISON:  Previous exam(s). PROCEDURE: I met with the patient and we discussed the procedure of ultrasound-guided biopsy, including benefits and alternatives. We discussed the high likelihood of a successful procedure. We discussed the risks of the procedure, including infection, bleeding, tissue injury, clip migration, and inadequate sampling. Informed written consent was given. The usual time-out  protocol was performed immediately prior to the procedure. Lesion quadrant: Upper outer quadrant Using sterile technique and 1% Lidocaine as local anesthetic, under direct ultrasound visualization, a 12 gauge spring-loaded device was used to perform biopsy of the 1.5 cm indeterminate mass seen in the 10 o'clock position of the right breast, 8 cm from the nipple, at ultrasound earlier today, using a caudal approach. At the conclusion of the procedure a ribbon shaped tissue marker clip was deployed into the biopsy cavity. Follow up 2 view mammogram was performed and dictated separately. IMPRESSION: Ultrasound guided biopsy of 1.5 cm indeterminate mass seen in the 10 o'clock position of the right breast earlier today. No apparent complications. Electronically Signed: By: Claudie Revering M.D. On: 06/02/2020 16:01      ASSESSMENT: 59 y.o. [place] woman status post right breast upper outer quadrant and right axillary lymph node biopsy 06/21/2016, both positive for a clinical T2 N1,stage IIIA  invasive ductal carcinoma, grade 3, estrogen and progesterone receptor positive, HER-2 nonamplified, with an MIB-1 between 20 and 25%   (1) neoadjuvant chemotherapy consisting of doxorubicin and cyclophosphamide in dose dense fashion 4, starting 07/17/2016, followed by weekly paclitaxel 12  (a) cyclophosphamide/doxorubicin interrupted after 2 cycles because of repeated febrile neutropenia episodes  (b) started weekly paclitaxel 08/23/2016  (c) paclitaxel discontinued after 7 cycles 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 Right Breast boosted an additional 10 Gy in 5 fractions.  (4) started anastrozole early part of May 2018  (a) bone density 03/23/2017 finds a T score of -2.6, osteoporosis.  (b) to start denosumab/Prolia after dental clearance (scheduled for extraction)  (c) anastrozole held 01/15/2018 for possible side effects, changed to exemestane  (5) on [name] trial, signed consent 05/30/2017, randomized to hormone therapy alone  (6) exemestane started 02/11/2018  (a) bone density on 03/23/2017 shows osteoporosis, T score of -2.6 in the AP spine  (b) started Prolia/denosumab 12/17/2018  (c) exemestane discontinued July 2021 with evidence of progression  (7) CT of the abdomen and pelvis obtained 11/28/2018 to evaluate for appendicitis showed no evidence of metastatic disease  METASTATIC DISEASE June 2021 (  8) chest CT scan 05/27/2020 shows bulky mediastinal and right hilar lymphadenopathy with right pleural nodules and a small right pleural effusion, no evidence of liver or bone involvement  (a) biopsy of right breast mass 06/02/2020 shows invasive ductal carcinoma, estrogen and progesterone receptor positive, HER-2 not amplified, with an MIB-1 of 40%  (9) fulvestrant to start 06/17/2020  (a) palbociclib to start 06/17/2020 at 125 mg daily 21 days on 7 days off   PLAN: Dwayna is doing quite well today.  She continues on treatment with Fulvestrant and Palbociclib for her metastatic breast cancer.  She is tolerating this well.  Her labs are within parameters to continue her treatments.  Clinically, she is responding to treatment as evidenced by her improved breathing, along with softer masses in her breast.  This is very promising.    Langley Gauss and I talked about her labs.  Her anc is 1.4, this is slightly low, but within parameters of continuing Palbociclib for the next week.  Her kidney function  is slightly elevated today.  She is drinking two large cups of water.  I recommended she increase it to 70 ounces during the summer months since it is hot outside.  She understands and plans to do this.    Tawan will return in 2 weeks for labs, f/u and her next appointment.  She knows to call for any questions that may arise between now and her next appointment.  We are happy to see her sooner if needed.   Total encounter time 20 minutes.Wilber Bihari, NP 07/01/20 2:23 PM Medical Oncology and Hematology [facility] [place], [place] [address] Tel. [phone]    Fax. [phone]  *Total Encounter Time as defined by the Centers for Medicare and Medicaid Services includes, in addition to the face-to-face time of a patient visit (documented in the note above) non-face-to-face time: obtaining and reviewing outside history, ordering and reviewing medications, tests or procedures, care coordination (communications with other health care professionals or caregivers) and documentation in the medical record.

## 2020-07-02 ENCOUNTER — Telehealth: Payer: Self-pay | Admitting: Adult Health

## 2020-07-02 ENCOUNTER — Telehealth: Payer: Self-pay | Admitting: Licensed Clinical Social Worker

## 2020-07-02 NOTE — Telephone Encounter (Signed)
[place] Work  Clinical Social Work was referred by Wilber Bihari, NP for assessment of psychosocial needs relating to adjusting to diagnosis.  Clinical Social Worker attempted to contact patient by phone. No answer, left VM with direct contact information.    Xachary Hambly, [place], [place] Worker [facility]

## 2020-07-02 NOTE — Telephone Encounter (Signed)
Received return call from patient. She is interested in counseling related to new metastatic diagnosis. Set up appointment for 07/07/20. Patient is also interested in speaking with chaplain from the spiritual perspective. Message sent to L. Lundeen requesting that she contact patient.   Edwinna Areola Vinod Mikesell, LCSW

## 2020-07-02 NOTE — Telephone Encounter (Signed)
No 7/29 los. No changes made to pt's schedule.  

## 2020-07-06 ENCOUNTER — Encounter: Payer: Self-pay | Admitting: General Practice

## 2020-07-06 NOTE — Progress Notes (Signed)
[place] Spiritual Care Note  Referred by Youlanda Mighty for spiritual support per [place]'s request. Terri Piedra by phone and scheduled in-person appointment for Thursday 8/12 after her injection.   [place], [place], [facility] Pager [phone] Voicemail [phone]

## 2020-07-07 ENCOUNTER — Other Ambulatory Visit: Payer: Medicaid Other | Admitting: Licensed Clinical Social Worker

## 2020-07-09 ENCOUNTER — Telehealth: Payer: Self-pay | Admitting: *Deleted

## 2020-07-09 NOTE — Telephone Encounter (Signed)
This RN spoke with pt per her call stating " I'm just not feeling good today ".  Bunny denies any fevers-chills or increased cough.  She does have a fever blister on her lip that appeared 4 days ago- and her gums bleed last post flossing.  She is currently on off week of 1st cycle of Ibrance.  This RN discussed above including fatigue being the primary side effect of the Ibrance that is most life interfering.  Discussed hydration,diet and rest for best management.  Of note pt states she is also having mid chest epigastric discomfort. She has not been taking pantoprazole due to no longer covered by her insurance.  This RN discussed above- pt has pepcid in the home and will take it bid over the weekend and then decrease to 1 tab daily.  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  She will call this office on Monday for update on symptoms for further recommendations.as well as she understands if symptoms worsen she is to call on call over the weekend.  Abiola verbalized understanding of plan as well as appreciation.

## 2020-07-12 ENCOUNTER — Other Ambulatory Visit: Payer: Self-pay | Admitting: *Deleted

## 2020-07-12 ENCOUNTER — Other Ambulatory Visit: Payer: Self-pay | Admitting: Medical

## 2020-07-12 ENCOUNTER — Inpatient Hospital Stay: Payer: Medicare Other | Attending: Oncology | Admitting: Medical

## 2020-07-12 ENCOUNTER — Encounter: Payer: Self-pay | Admitting: Medical

## 2020-07-12 ENCOUNTER — Telehealth: Payer: Self-pay | Admitting: *Deleted

## 2020-07-12 ENCOUNTER — Other Ambulatory Visit: Payer: Self-pay

## 2020-07-12 ENCOUNTER — Inpatient Hospital Stay: Payer: Medicare Other

## 2020-07-12 VITALS — BP 110/93 | HR 84 | Temp 98.8°F | Resp 16 | Ht 64.0 in | Wt 221.2 lb

## 2020-07-12 DIAGNOSIS — C773 Secondary and unspecified malignant neoplasm of axilla and upper limb lymph nodes: Secondary | ICD-10-CM

## 2020-07-12 DIAGNOSIS — E7800 Pure hypercholesterolemia, unspecified: Secondary | ICD-10-CM | POA: Diagnosis not present

## 2020-07-12 DIAGNOSIS — E860 Dehydration: Secondary | ICD-10-CM | POA: Insufficient documentation

## 2020-07-12 DIAGNOSIS — K219 Gastro-esophageal reflux disease without esophagitis: Secondary | ICD-10-CM | POA: Diagnosis not present

## 2020-07-12 DIAGNOSIS — C50919 Malignant neoplasm of unspecified site of unspecified female breast: Secondary | ICD-10-CM

## 2020-07-12 DIAGNOSIS — K1230 Oral mucositis (ulcerative), unspecified: Secondary | ICD-10-CM | POA: Insufficient documentation

## 2020-07-12 DIAGNOSIS — Z79899 Other long term (current) drug therapy: Secondary | ICD-10-CM | POA: Insufficient documentation

## 2020-07-12 DIAGNOSIS — R5383 Other fatigue: Secondary | ICD-10-CM | POA: Diagnosis not present

## 2020-07-12 DIAGNOSIS — M1990 Unspecified osteoarthritis, unspecified site: Secondary | ICD-10-CM | POA: Insufficient documentation

## 2020-07-12 DIAGNOSIS — D708 Other neutropenia: Secondary | ICD-10-CM | POA: Diagnosis not present

## 2020-07-12 DIAGNOSIS — Z87891 Personal history of nicotine dependence: Secondary | ICD-10-CM | POA: Insufficient documentation

## 2020-07-12 DIAGNOSIS — C50411 Malignant neoplasm of upper-outer quadrant of right female breast: Secondary | ICD-10-CM | POA: Insufficient documentation

## 2020-07-12 DIAGNOSIS — Z170 Estrogen receptor positive status [ER+]: Secondary | ICD-10-CM | POA: Insufficient documentation

## 2020-07-12 DIAGNOSIS — R21 Rash and other nonspecific skin eruption: Secondary | ICD-10-CM

## 2020-07-12 LAB — CBC WITH DIFFERENTIAL (CANCER CENTER ONLY)
Abs Immature Granulocytes: 0.01 10*3/uL (ref 0.00–0.07)
Basophils Absolute: 0 10*3/uL (ref 0.0–0.1)
Basophils Relative: 1 %
Eosinophils Absolute: 0 10*3/uL (ref 0.0–0.5)
Eosinophils Relative: 0 %
HCT: 34.4 % — ABNORMAL LOW (ref 36.0–46.0)
Hemoglobin: 12 g/dL (ref 12.0–15.0)
Immature Granulocytes: 1 %
Lymphocytes Relative: 42 %
Lymphs Abs: 0.6 10*3/uL — ABNORMAL LOW (ref 0.7–4.0)
MCH: 34.9 pg — ABNORMAL HIGH (ref 26.0–34.0)
MCHC: 34.9 g/dL (ref 30.0–36.0)
MCV: 100 fL (ref 80.0–100.0)
Monocytes Absolute: 0.1 10*3/uL (ref 0.1–1.0)
Monocytes Relative: 9 %
Neutro Abs: 0.7 10*3/uL — ABNORMAL LOW (ref 1.7–7.7)
Neutrophils Relative %: 47 %
Platelet Count: 199 10*3/uL (ref 150–400)
RBC: 3.44 MIL/uL — ABNORMAL LOW (ref 3.87–5.11)
RDW: 13.7 % (ref 11.5–15.5)
WBC Count: 1.5 10*3/uL — ABNORMAL LOW (ref 4.0–10.5)
nRBC: 0 % (ref 0.0–0.2)

## 2020-07-12 LAB — CMP (CANCER CENTER ONLY)
ALT: 9 U/L (ref 0–44)
AST: 12 U/L — ABNORMAL LOW (ref 15–41)
Albumin: 3.4 g/dL — ABNORMAL LOW (ref 3.5–5.0)
Alkaline Phosphatase: 105 U/L (ref 38–126)
Anion gap: 7 (ref 5–15)
BUN: 14 mg/dL (ref 6–20)
CO2: 23 mmol/L (ref 22–32)
Calcium: 9.2 mg/dL (ref 8.9–10.3)
Chloride: 110 mmol/L (ref 98–111)
Creatinine: 0.8 mg/dL (ref 0.44–1.00)
GFR, Est AFR Am: >60 mL/min (ref >60)
GFR, Estimated: >60 mL/min (ref >60)
Glucose, Bld: 80 mg/dL (ref 70–99)
Potassium: 4.2 mmol/L (ref 3.5–5.1)
Sodium: 140 mmol/L (ref 135–145)
Total Bilirubin: 0.2 mg/dL — ABNORMAL LOW (ref 0.3–1.2)
Total Protein: 6.6 g/dL (ref 6.5–8.1)

## 2020-07-12 MED ORDER — LIDOCAINE VISCOUS HCL 2 % MT SOLN: 5.0000 mL | OROMUCOSAL | 1 refills | Status: DC | PRN

## 2020-07-12 MED ORDER — TRIAMCINOLONE ACETONIDE 0.1 % EX LOTN: 1.0000 "application " | TOPICAL_LOTION | Freq: Three times a day (TID) | CUTANEOUS | 1 refills | Status: DC

## 2020-07-12 MED ORDER — OXYCODONE HCL 5 MG/5ML PO SOLN: 5.0000 mg | ORAL | 0 refills | Status: DC | PRN

## 2020-07-12 MED ORDER — MAGIC MOUTHWASH: 5.0000 mL | Freq: Four times a day (QID) | ORAL | 0 refills | Status: DC | PRN

## 2020-07-12 NOTE — Patient Instructions (Signed)
Oral Mucositis Oral mucositis is a mouth condition that may develop as a result of treatments for cancer. Sores may appear on the lips, gums, tongue, throat, and the top (roof) or bottom (floor) of the mouth. What are the causes? This condition is caused when cancer treatments damage the lining of the mouth. This condition can happen to anyone who is being treated with cancer therapies, including:  Cancer medicines (chemotherapy) or targeted therapy.  Radiation therapy.  Bone marrow transplants and stem cell transplants. Oral mucositis is not caused by infection. However, the sores can become infected after they form. Infection can make oral mucositis worse. What increases the risk? The following factors may make you more likely to develop this condition:  Having cancers that primarily affect the blood, head, or neck.  Receiving radiation therapy alone, or in combination with chemotherapy, to the head and neck region.  Undergoing high dose chemotherapy alone or as part of bone marrow or stem cell transplant. In addition, there are other risks, including:  Having poor oral hygiene, dental problems or oral diseases.  Wearing dentures that do not fit correctly.  Having other medical conditions, such as diabetes, HIV, AIDS, or kidney disease.  Using products that contain nicotine or tobacco, such as cigarettes, chewing tobacco, and e-cigarettes.  Not drinking enough clear fluids.  Drinking alcohol. What are the signs or symptoms? Symptoms of this condition include:  Mouth sores. These sores may bleed.  Color changes inside the mouth. Red, shiny areas may appear.  White patches or pus in the mouth.  Pain in the mouth and throat. This can make it painful to speak and swallow.  Dryness and a burning feeling in the mouth.  Saliva that is thick.  Trouble eating, drinking, and swallowing. This can lead to weight loss. Symptoms of this condition can vary from mild to severe.  Symptoms are usually seen 7-10 days after cancer treatment has started. How is this diagnosed? This condition can be diagnosed with a physical exam. In some cases, lab tests or cultures may be done to check for an associated infection. How is this treated? Treatment depends on the severity of the condition. Oral mucositis often heals on its own. Sometimes, changes in the cancer treatment can help. Treatment may include medicines or therapies, such as:  An antibiotic medicine to fight infection.  Medicine or therapies that help the cells in your mouth heal more quickly.  Chewing on ice before treatment to help prevent mucositis. Medicine may also be given to help control pain. This may include:  Pain relievers that are swished around in the mouth (topical anesthetics). These make the mouth numb to ease the pain.  Mouth rinses.  Prescribed, medicated gels. The gel coats the mouth. This protects nerve endings and lessens the pain.  Pain medicines. Follow these instructions at home: Medicines  Take or apply over-the-counter and prescription medicines only as told by your health care provider.  If you were prescribed an antibiotic medicine, take or apply it as told by your health care provider. Do not stop using the antibiotic even if you start to feel better.  Do not use products that contain benzocaine, including numbing gels, to treat mouth pain in children who are younger than 2 years. These products may cause a rare but serious blood condition. Eating and drinking   Talk to a diet and nutrition specialist (dietitian) about what you should eat and drink if you have mucositis.  Drink high-nutrition and high-calorie shakes or supplements.    Eat bland and soft foods that are easy to eat.  Drink enough fluid to keep your urine pale yellow.  Do not eat foods that are hot, spicy, citrus, or hard to swallow.  Do not drink alcohol.  Try sucking on ice chips or sugar-free frozen pops.  This may help with pain. This also keeps your mouth moist. Lifestyle      Keep your mouth clean and germ-free. To maintain good oral hygiene: ? Brush your teeth carefully with a soft toothbrush at least two times each day. Use a gentle toothpaste. Ask your health care provider to recommend the right toothpaste for you. ? Use a soft sponge (oral swab) to clean your mouth and teeth instead of a toothbrush if mouth sores are severe. ? Floss your teeth every day. ? Have your teeth cleaned regularly as recommended by your dentist. ? Rinse your mouth after every meal or as directed by your health care provider. Do not use mouthwash that contains alcohol. Ask your health care provider for a mouthwash or mouth rinse recommendation.  Do not use any products that contain nicotine or tobacco, such as cigarettes, e-cigarettes, and chewing tobacco. If you need help quitting, ask your health care provider. General instructions  Follow instructions from your health care provider about: ? Cleaning mouth sores. ? Taking out your dentures. ? Changing your diet or finding other ways to get nutrients. This is important if you are losing weight.  If your lips are dry or cracked, apply a water-based moisturizer to your lips as needed.  Keep all follow-up visits as told by your health care provider. This is important. Contact a health care provider if:  You have mouth pain or throat pain.  Your symptoms get worse.  You have new symptoms.  Your pain is not controlled with medicine.  You have trouble speaking. Get help right away if you:  Have a fever.  Cannot swallow solid food or liquids.  Have a lot of bleeding in your mouth.  Develop new, open, or draining sores in your mouth. Summary  Oral mucositis is a mouth condition that may develop as a result of treatments for cancer.  Sores may appear on your lips, gums, tongue, throat, and the top (roof) or bottom (floor) of your mouth.  Cancer  treatments can damage the lining of the mouth, which causes this condition.  Treatment depends on how severe the condition is. It may include medicine or therapies to fight infection, medicine to ease pain, or medicine to help the cells in your mouth heal more quickly. This information is not intended to replace advice given to you by your health care provider. Make sure you discuss any questions you have with your health care provider. Document Revised: 06/30/2019 Document Reviewed: 06/30/2019 Elsevier Patient Education  2020 [employer]  

## 2020-07-12 NOTE — Progress Notes (Signed)
Called pt's preferred [facility] pharmacy to confirm receipt of magic mouthwash prescription that was faxed over.  Confirmed.

## 2020-07-12 NOTE — Telephone Encounter (Signed)
This RN spoke with pt per her call stating worsening of mouth sores over the weekend despite increased dosing of the Valtrex.  She has been using OTC oral rinses as well.  She is currently on week 4 ( off week ) of cycle 1 palbociclib.  Pt will come in today for lab and assessment by [facility] provider per communication.  Appt request sent and pt is aware.

## 2020-07-13 ENCOUNTER — Other Ambulatory Visit: Payer: Self-pay | Admitting: Medical

## 2020-07-13 ENCOUNTER — Telehealth: Payer: Self-pay | Admitting: Emergency Medicine

## 2020-07-13 DIAGNOSIS — R059 Cough, unspecified: Secondary | ICD-10-CM

## 2020-07-13 DIAGNOSIS — K1230 Oral mucositis (ulcerative), unspecified: Secondary | ICD-10-CM

## 2020-07-13 DIAGNOSIS — R0600 Dyspnea, unspecified: Secondary | ICD-10-CM

## 2020-07-13 DIAGNOSIS — R918 Other nonspecific abnormal finding of lung field: Secondary | ICD-10-CM

## 2020-07-13 LAB — CANCER ANTIGEN 27.29: CA 27.29: 197.1 U/mL — ABNORMAL HIGH (ref 0.0–38.6)

## 2020-07-13 MED ORDER — LIDOCAINE VISCOUS HCL 2 % MT SOLN: 5.0000 mL | OROMUCOSAL | 1 refills | Status: DC | PRN

## 2020-07-13 MED ORDER — OXYCODONE HCL 5 MG/5ML PO SOLN: 5.0000 mg | ORAL | 0 refills | Status: DC | PRN
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
## 2020-07-13 NOTE — Telephone Encounter (Signed)
Returning pt's VM reporting original pharmacy will only fill prescriptions sent in by PA [facility] for recent [facility] visit at high cost/will not take GoodRx.  Pt requesting that oxycodone and lidocaine prescriptions be sent to [place] at [facility] where they can be filled for lower cost.  Magic mouth wash was filled already without any issues/cost per pt.  PA Lucianne Lei sending prescriptions to new pharmacy as requested.  Pt aware to f/u as needed with any further questions or concerns.

## 2020-07-14 ENCOUNTER — Inpatient Hospital Stay: Payer: Medicare Other | Admitting: Licensed Clinical Social Worker

## 2020-07-15 ENCOUNTER — Ambulatory Visit: Payer: Medicaid Other

## 2020-07-15 ENCOUNTER — Inpatient Hospital Stay: Payer: Medicare Other

## 2020-07-15 ENCOUNTER — Telehealth: Payer: Self-pay

## 2020-07-15 ENCOUNTER — Other Ambulatory Visit: Payer: Self-pay

## 2020-07-15 ENCOUNTER — Inpatient Hospital Stay (HOSPITAL_BASED_OUTPATIENT_CLINIC_OR_DEPARTMENT_OTHER): Payer: Medicare Other | Admitting: Adult Health

## 2020-07-15 VITALS — BP 121/83 | HR 74 | Temp 97.8°F | Resp 18 | Ht 64.0 in | Wt 217.9 lb

## 2020-07-15 DIAGNOSIS — K1230 Oral mucositis (ulcerative), unspecified: Secondary | ICD-10-CM | POA: Diagnosis not present

## 2020-07-15 DIAGNOSIS — C50919 Malignant neoplasm of unspecified site of unspecified female breast: Secondary | ICD-10-CM | POA: Diagnosis not present

## 2020-07-15 DIAGNOSIS — Z170 Estrogen receptor positive status [ER+]: Secondary | ICD-10-CM

## 2020-07-15 DIAGNOSIS — C773 Secondary and unspecified malignant neoplasm of axilla and upper limb lymph nodes: Secondary | ICD-10-CM | POA: Diagnosis not present

## 2020-07-15 DIAGNOSIS — C50411 Malignant neoplasm of upper-outer quadrant of right female breast: Secondary | ICD-10-CM

## 2020-07-15 DIAGNOSIS — M810 Age-related osteoporosis without current pathological fracture: Secondary | ICD-10-CM

## 2020-07-15 LAB — CBC WITH DIFFERENTIAL/PLATELET
Abs Immature Granulocytes: 0.01 10*3/uL (ref 0.00–0.07)
Basophils Absolute: 0 10*3/uL (ref 0.0–0.1)
Basophils Relative: 1 %
Eosinophils Absolute: 0 10*3/uL (ref 0.0–0.5)
Eosinophils Relative: 1 %
HCT: 34.9 % — ABNORMAL LOW (ref 36.0–46.0)
Hemoglobin: 12.3 g/dL (ref 12.0–15.0)
Immature Granulocytes: 1 %
Lymphocytes Relative: 43 %
Lymphs Abs: 0.7 10*3/uL (ref 0.7–4.0)
MCH: 35.3 pg — ABNORMAL HIGH (ref 26.0–34.0)
MCHC: 35.2 g/dL (ref 30.0–36.0)
MCV: 100.3 fL — ABNORMAL HIGH (ref 80.0–100.0)
Monocytes Absolute: 0.2 10*3/uL (ref 0.1–1.0)
Monocytes Relative: 13 %
Neutro Abs: 0.7 10*3/uL — ABNORMAL LOW (ref 1.7–7.7)
Neutrophils Relative %: 41 %
Platelets: 222 10*3/uL (ref 150–400)
RBC: 3.48 MIL/uL — ABNORMAL LOW (ref 3.87–5.11)
RDW: 13.8 % (ref 11.5–15.5)
WBC: 1.6 10*3/uL — ABNORMAL LOW (ref 4.0–10.5)
nRBC: 0 % (ref 0.0–0.2)

## 2020-07-15 LAB — COMPREHENSIVE METABOLIC PANEL
ALT: 10 U/L (ref 0–44)
AST: 14 U/L — ABNORMAL LOW (ref 15–41)
Albumin: 3.5 g/dL (ref 3.5–5.0)
Alkaline Phosphatase: 102 U/L (ref 38–126)
Anion gap: 8 (ref 5–15)
BUN: 15 mg/dL (ref 6–20)
CO2: 21 mmol/L — ABNORMAL LOW (ref 22–32)
Calcium: 9.2 mg/dL (ref 8.9–10.3)
Chloride: 111 mmol/L (ref 98–111)
Creatinine, Ser: 0.83 mg/dL (ref 0.44–1.00)
GFR calc Af Amer: >60 mL/min (ref >60)
GFR calc non Af Amer: >60 mL/min (ref >60)
Glucose, Bld: 99 mg/dL (ref 70–99)
Potassium: 3.9 mmol/L (ref 3.5–5.1)
Sodium: 140 mmol/L (ref 135–145)
Total Bilirubin: 0.3 mg/dL (ref 0.3–1.2)
Total Protein: 6.7 g/dL (ref 6.5–8.1)

## 2020-07-15 MED ORDER — OXYCODONE HCL 5 MG PO TABS: 5.0000 mg | ORAL_TABLET | ORAL | 0 refills | Status: DC | PRN

## 2020-07-15 MED ORDER — SODIUM CHLORIDE 0.9 % IV SOLN
INTRAVENOUS | Status: AC
  Filled 2020-07-15: qty 250

## 2020-07-15 MED ORDER — VALACYCLOVIR HCL 1 G PO TABS: ORAL_TABLET | ORAL | 4 refills | Status: DC

## 2020-07-15 MED ORDER — MAGIC MOUTHWASH: 5.0000 mL | Freq: Four times a day (QID) | ORAL | 0 refills | Status: DC | PRN

## 2020-07-15 MED ORDER — FULVESTRANT 250 MG/5ML IM SOLN
500.0000 mg | Freq: Once | INTRAMUSCULAR | Status: AC
  Administered 2020-07-15: 500 mg via INTRAMUSCULAR

## 2020-07-15 MED ORDER — SODIUM CHLORIDE 0.9 % IV SOLN
INTRAVENOUS | Status: DC
  Filled 2020-07-15: qty 250

## 2020-07-15 MED ORDER — FULVESTRANT 250 MG/5ML IM SOLN
INTRAMUSCULAR | Status: AC
  Filled 2020-07-15: qty 10
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
## 2020-07-15 NOTE — Telephone Encounter (Signed)
Per Wilber Bihari, NP Mouth wash for mucositis called in to  [facility] at [facility] on [address] with following directions : Nystatin, Benadryl, Mylanta 1:1:1 240 mL. Take 5 mL PO 4 times a daily as needed for mouth pain. 0 refills.

## 2020-07-15 NOTE — Patient Instructions (Signed)

## 2020-07-16 ENCOUNTER — Telehealth: Payer: Self-pay | Admitting: Adult Health

## 2020-07-16 ENCOUNTER — Other Ambulatory Visit: Payer: Self-pay

## 2020-07-16 ENCOUNTER — Inpatient Hospital Stay: Payer: Medicare Other

## 2020-07-16 VITALS — BP 122/86 | HR 66 | Temp 98.9°F | Resp 18

## 2020-07-16 DIAGNOSIS — R110 Nausea: Secondary | ICD-10-CM

## 2020-07-16 DIAGNOSIS — M810 Age-related osteoporosis without current pathological fracture: Secondary | ICD-10-CM

## 2020-07-16 DIAGNOSIS — C50411 Malignant neoplasm of upper-outer quadrant of right female breast: Secondary | ICD-10-CM | POA: Diagnosis not present

## 2020-07-16 MED ORDER — SODIUM CHLORIDE 0.9 % IV SOLN
INTRAVENOUS | Status: AC
  Filled 2020-07-16 (×2): qty 250

## 2020-07-16 MED ORDER — ONDANSETRON HCL 4 MG/2ML IJ SOLN
INTRAMUSCULAR | Status: AC
  Filled 2020-07-16: qty 4

## 2020-07-16 MED ORDER — ONDANSETRON HCL 4 MG/2ML IJ SOLN
8.0000 mg | Freq: Once | INTRAMUSCULAR | Status: AC
  Administered 2020-07-16: 8 mg via INTRAVENOUS

## 2020-07-16 MED ORDER — PROCHLORPERAZINE MALEATE 10 MG PO TABS
10.0000 mg | ORAL_TABLET | Freq: Four times a day (QID) | ORAL | 0 refills | Status: DC | PRN
Start: 2020-07-16 — End: 2020-07-29

## 2020-07-16 NOTE — Patient Instructions (Signed)

## 2020-07-16 NOTE — Telephone Encounter (Signed)
Scheduled appts per 8/12 los. Pt confirmed appt dates and times.

## 2020-07-16 NOTE — Progress Notes (Signed)
Pt called and states she wanted to cancel infusion appt for IVF because she is nauseated.  This nurse spoke with pt to encourage coming in for IVF as she has been dehydrated.   Per Wilber Bihari, NP, pt will be given Zofran 8 mg IV when she comes for IVF and Compazine 10 mg 1 tab Q6H has been called in for pt. Pt verbalizes thanks and understanding.

## 2020-07-17 ENCOUNTER — Other Ambulatory Visit: Payer: Self-pay

## 2020-07-17 ENCOUNTER — Inpatient Hospital Stay: Payer: Medicare Other

## 2020-07-17 VITALS — BP 131/96 | HR 67 | Temp 98.4°F | Resp 18

## 2020-07-17 DIAGNOSIS — C50411 Malignant neoplasm of upper-outer quadrant of right female breast: Secondary | ICD-10-CM | POA: Diagnosis not present

## 2020-07-17 DIAGNOSIS — M810 Age-related osteoporosis without current pathological fracture: Secondary | ICD-10-CM

## 2020-07-17 MED ORDER — SODIUM CHLORIDE 0.9 % IV SOLN
INTRAVENOUS | Status: AC
  Filled 2020-07-17: qty 250

## 2020-07-17 NOTE — Patient Instructions (Signed)

## 2020-07-19 ENCOUNTER — Telehealth: Payer: Self-pay

## 2020-07-19 NOTE — Telephone Encounter (Signed)
Received TC from patient  stating that she wanted to give Mendel Ryder NP an update about her mouth. She stated that her mouth is still not feeling great. She can barely eat or drink because it makes her mouth hurt still. She said sometimes its hard to talk and get people to understand her words. She's also not taking the pain medication that was prescribed for her. Says it makes her feel too sick but she is doing the magic mouth wash and using salt water rinses when needed. Asked patient if she thought that she needed to come in and be seen sooner. Patient stated no that she would just follow up with Mendel Ryder NP at her appointment on Thursday but was asked to call Mendel Ryder NP with a mouth status from the weekend. Mendel Ryder made aware. Staff message sent.

## 2020-07-22 ENCOUNTER — Encounter: Payer: Self-pay | Admitting: Adult Health

## 2020-07-22 ENCOUNTER — Inpatient Hospital Stay: Payer: Medicare Other

## 2020-07-22 ENCOUNTER — Inpatient Hospital Stay (HOSPITAL_BASED_OUTPATIENT_CLINIC_OR_DEPARTMENT_OTHER): Payer: Medicare Other | Admitting: Adult Health

## 2020-07-22 ENCOUNTER — Other Ambulatory Visit: Payer: Self-pay

## 2020-07-22 VITALS — BP 141/76 | HR 70 | Temp 99.0°F | Resp 18

## 2020-07-22 VITALS — BP 135/94 | HR 79 | Temp 98.6°F | Resp 20 | Ht 64.0 in | Wt 214.7 lb

## 2020-07-22 DIAGNOSIS — C50411 Malignant neoplasm of upper-outer quadrant of right female breast: Secondary | ICD-10-CM | POA: Diagnosis not present

## 2020-07-22 DIAGNOSIS — C50919 Malignant neoplasm of unspecified site of unspecified female breast: Secondary | ICD-10-CM

## 2020-07-22 DIAGNOSIS — M810 Age-related osteoporosis without current pathological fracture: Secondary | ICD-10-CM

## 2020-07-22 DIAGNOSIS — C773 Secondary and unspecified malignant neoplasm of axilla and upper limb lymph nodes: Secondary | ICD-10-CM

## 2020-07-22 DIAGNOSIS — Z170 Estrogen receptor positive status [ER+]: Secondary | ICD-10-CM

## 2020-07-22 LAB — COMPREHENSIVE METABOLIC PANEL
ALT: 11 U/L (ref 0–44)
AST: 16 U/L (ref 15–41)
Albumin: 3.7 g/dL (ref 3.5–5.0)
Alkaline Phosphatase: 101 U/L (ref 38–126)
Anion gap: 8 (ref 5–15)
BUN: 10 mg/dL (ref 6–20)
CO2: 21 mmol/L — ABNORMAL LOW (ref 22–32)
Calcium: 8.7 mg/dL — ABNORMAL LOW (ref 8.9–10.3)
Chloride: 111 mmol/L (ref 98–111)
Creatinine, Ser: 0.79 mg/dL (ref 0.44–1.00)
GFR calc Af Amer: >60 mL/min (ref >60)
GFR calc non Af Amer: >60 mL/min (ref >60)
Glucose, Bld: 83 mg/dL (ref 70–99)
Potassium: 3.8 mmol/L (ref 3.5–5.1)
Sodium: 140 mmol/L (ref 135–145)
Total Bilirubin: 0.4 mg/dL (ref 0.3–1.2)
Total Protein: 7.1 g/dL (ref 6.5–8.1)

## 2020-07-22 LAB — CBC WITH DIFFERENTIAL/PLATELET
Abs Immature Granulocytes: 0.01 10*3/uL (ref 0.00–0.07)
Basophils Absolute: 0 10*3/uL (ref 0.0–0.1)
Basophils Relative: 2 %
Eosinophils Absolute: 0 10*3/uL (ref 0.0–0.5)
Eosinophils Relative: 0 %
HCT: 38.4 % (ref 36.0–46.0)
Hemoglobin: 13.4 g/dL (ref 12.0–15.0)
Immature Granulocytes: 0 %
Lymphocytes Relative: 31 %
Lymphs Abs: 0.8 10*3/uL (ref 0.7–4.0)
MCH: 35.2 pg — ABNORMAL HIGH (ref 26.0–34.0)
MCHC: 34.9 g/dL (ref 30.0–36.0)
MCV: 100.8 fL — ABNORMAL HIGH (ref 80.0–100.0)
Monocytes Absolute: 0.4 10*3/uL (ref 0.1–1.0)
Monocytes Relative: 16 %
Neutro Abs: 1.4 10*3/uL — ABNORMAL LOW (ref 1.7–7.7)
Neutrophils Relative %: 51 %
Platelets: 302 10*3/uL (ref 150–400)
RBC: 3.81 MIL/uL — ABNORMAL LOW (ref 3.87–5.11)
RDW: 15.4 % (ref 11.5–15.5)
WBC: 2.7 10*3/uL — ABNORMAL LOW (ref 4.0–10.5)
nRBC: 0 % (ref 0.0–0.2)

## 2020-07-22 MED ORDER — SODIUM CHLORIDE 0.9 % IV SOLN
INTRAVENOUS | Status: AC
  Filled 2020-07-22 (×2): qty 250
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
## 2020-07-22 NOTE — Patient Instructions (Signed)

## 2020-07-23 ENCOUNTER — Telehealth: Payer: Self-pay | Admitting: Adult Health

## 2020-07-23 ENCOUNTER — Inpatient Hospital Stay: Payer: Medicare Other

## 2020-07-23 ENCOUNTER — Other Ambulatory Visit: Payer: Self-pay

## 2020-07-23 VITALS — BP 133/73 | HR 67 | Resp 18

## 2020-07-23 DIAGNOSIS — C50411 Malignant neoplasm of upper-outer quadrant of right female breast: Secondary | ICD-10-CM | POA: Diagnosis not present

## 2020-07-23 DIAGNOSIS — M810 Age-related osteoporosis without current pathological fracture: Secondary | ICD-10-CM

## 2020-07-23 MED ORDER — SODIUM CHLORIDE 0.9 % IV SOLN
INTRAVENOUS | Status: AC
  Filled 2020-07-23 (×2): qty 250

## 2020-07-23 NOTE — Patient Instructions (Signed)

## 2020-07-23 NOTE — Telephone Encounter (Signed)
No 8/19 los. No changes made to pt's schedule.

## 2020-07-24 ENCOUNTER — Other Ambulatory Visit: Payer: Self-pay

## 2020-07-24 ENCOUNTER — Inpatient Hospital Stay: Payer: Medicare Other

## 2020-07-24 VITALS — BP 128/72 | HR 72 | Temp 98.2°F | Resp 18

## 2020-07-24 DIAGNOSIS — M810 Age-related osteoporosis without current pathological fracture: Secondary | ICD-10-CM

## 2020-07-24 DIAGNOSIS — C50411 Malignant neoplasm of upper-outer quadrant of right female breast: Secondary | ICD-10-CM | POA: Diagnosis not present

## 2020-07-24 MED ORDER — SODIUM CHLORIDE 0.9 % IV SOLN
INTRAVENOUS | Status: AC
  Filled 2020-07-24: qty 250

## 2020-07-24 NOTE — Patient Instructions (Signed)

## 2020-07-26 ENCOUNTER — Inpatient Hospital Stay: Payer: Medicare Other

## 2020-07-26 ENCOUNTER — Other Ambulatory Visit: Payer: Self-pay

## 2020-07-26 VITALS — BP 135/92 | HR 65 | Temp 98.3°F | Resp 17 | Ht 64.0 in | Wt 219.0 lb

## 2020-07-26 DIAGNOSIS — M810 Age-related osteoporosis without current pathological fracture: Secondary | ICD-10-CM

## 2020-07-26 DIAGNOSIS — C50411 Malignant neoplasm of upper-outer quadrant of right female breast: Secondary | ICD-10-CM | POA: Diagnosis not present

## 2020-07-26 MED ORDER — SODIUM CHLORIDE 0.9 % IV SOLN
INTRAVENOUS | Status: AC
  Filled 2020-07-26: qty 250

## 2020-07-26 NOTE — Patient Instructions (Signed)

## 2020-07-29 ENCOUNTER — Inpatient Hospital Stay (HOSPITAL_BASED_OUTPATIENT_CLINIC_OR_DEPARTMENT_OTHER): Payer: Medicare Other | Admitting: Adult Health

## 2020-07-29 ENCOUNTER — Inpatient Hospital Stay: Payer: Medicare Other

## 2020-07-29 ENCOUNTER — Other Ambulatory Visit: Payer: Self-pay

## 2020-07-29 ENCOUNTER — Other Ambulatory Visit: Payer: Self-pay | Admitting: Internal Medicine

## 2020-07-29 ENCOUNTER — Encounter: Payer: Self-pay | Admitting: Adult Health

## 2020-07-29 ENCOUNTER — Telehealth: Payer: Self-pay

## 2020-07-29 VITALS — BP 134/80 | HR 75 | Temp 98.1°F | Resp 18 | Ht 64.0 in | Wt 218.5 lb

## 2020-07-29 DIAGNOSIS — C50919 Malignant neoplasm of unspecified site of unspecified female breast: Secondary | ICD-10-CM | POA: Diagnosis present

## 2020-07-29 DIAGNOSIS — C773 Secondary and unspecified malignant neoplasm of axilla and upper limb lymph nodes: Secondary | ICD-10-CM

## 2020-07-29 DIAGNOSIS — Z170 Estrogen receptor positive status [ER+]: Secondary | ICD-10-CM | POA: Diagnosis not present

## 2020-07-29 DIAGNOSIS — C50411 Malignant neoplasm of upper-outer quadrant of right female breast: Secondary | ICD-10-CM

## 2020-07-29 LAB — COMPREHENSIVE METABOLIC PANEL
ALT: 6 U/L (ref 0–44)
AST: 12 U/L — ABNORMAL LOW (ref 15–41)
Albumin: 3.6 g/dL (ref 3.5–5.0)
Alkaline Phosphatase: 84 U/L (ref 38–126)
Anion gap: 7 (ref 5–15)
BUN: 16 mg/dL (ref 6–20)
CO2: 24 mmol/L (ref 22–32)
Calcium: 9.4 mg/dL (ref 8.9–10.3)
Chloride: 108 mmol/L (ref 98–111)
Creatinine, Ser: 0.95 mg/dL (ref 0.44–1.00)
GFR calc Af Amer: >60 mL/min (ref >60)
GFR calc non Af Amer: >60 mL/min (ref >60)
Glucose, Bld: 84 mg/dL (ref 70–99)
Potassium: 3.7 mmol/L (ref 3.5–5.1)
Sodium: 139 mmol/L (ref 135–145)
Total Bilirubin: 0.4 mg/dL (ref 0.3–1.2)
Total Protein: 6.7 g/dL (ref 6.5–8.1)

## 2020-07-29 LAB — CBC WITH DIFFERENTIAL/PLATELET
Abs Immature Granulocytes: 0.01 10*3/uL (ref 0.00–0.07)
Basophils Absolute: 0.1 10*3/uL (ref 0.0–0.1)
Basophils Relative: 1 %
Eosinophils Absolute: 0.1 10*3/uL (ref 0.0–0.5)
Eosinophils Relative: 1 %
HCT: 36.4 % (ref 36.0–46.0)
Hemoglobin: 12.7 g/dL (ref 12.0–15.0)
Immature Granulocytes: 0 %
Lymphocytes Relative: 25 %
Lymphs Abs: 0.9 10*3/uL (ref 0.7–4.0)
MCH: 35.8 pg — ABNORMAL HIGH (ref 26.0–34.0)
MCHC: 34.9 g/dL (ref 30.0–36.0)
MCV: 102.5 fL — ABNORMAL HIGH (ref 80.0–100.0)
Monocytes Absolute: 0.3 10*3/uL (ref 0.1–1.0)
Monocytes Relative: 7 %
Neutro Abs: 2.5 10*3/uL (ref 1.7–7.7)
Neutrophils Relative %: 66 %
Platelets: 334 10*3/uL (ref 150–400)
RBC: 3.55 MIL/uL — ABNORMAL LOW (ref 3.87–5.11)
RDW: 15.7 % — ABNORMAL HIGH (ref 11.5–15.5)
WBC: 3.8 10*3/uL — ABNORMAL LOW (ref 4.0–10.5)
nRBC: 0 % (ref 0.0–0.2)

## 2020-07-29 NOTE — Telephone Encounter (Signed)
Spoke with pt regarding need for Medicaid. Messages sent to [place]. Informed pt we would be in touch with more information; verbalized thanks and understanding.

## 2020-07-29 NOTE — Progress Notes (Signed)
Received message from nurse regarding patient concerns with [facility] Medicaid, Medicaid and [employer].  Called patient to introduce myself as Arboriculturist and to listen to concerns.  Forwarded message to Altha Harm w/ [place] Medicaid and Benjamine Mola w/ oral oncology clinic for [place].  Advised patient she should be expecting a call from them regarding concerns. She was very Patent attorney.
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
## 2020-07-30 ENCOUNTER — Telehealth: Payer: Self-pay | Admitting: Adult Health

## 2020-07-30 ENCOUNTER — Telehealth: Payer: Self-pay

## 2020-07-30 NOTE — Telephone Encounter (Signed)
Canceled appts and scheduled appts per 8//26 los. Pt confirmed cancellations and new appt date and time.

## 2020-08-02 ENCOUNTER — Other Ambulatory Visit: Payer: Self-pay

## 2020-08-02 ENCOUNTER — Ambulatory Visit
Admission: RE | Admit: 2020-08-02 | Discharge: 2020-08-02 | Disposition: A | Discharge status: Home / Self Care | Payer: Medicaid Other | Source: Ambulatory Visit | Attending: Adult Health | Admitting: Adult Health

## 2020-08-02 DIAGNOSIS — C50919 Malignant neoplasm of unspecified site of unspecified female breast: Secondary | ICD-10-CM

## 2020-08-02 DIAGNOSIS — E2839 Other primary ovarian failure: Secondary | ICD-10-CM

## 2020-08-04 ENCOUNTER — Other Ambulatory Visit: Payer: Self-pay | Admitting: Oncology

## 2020-08-04 ENCOUNTER — Other Ambulatory Visit: Payer: Self-pay | Admitting: *Deleted

## 2020-08-04 ENCOUNTER — Telehealth: Payer: Self-pay | Admitting: *Deleted

## 2020-08-04 DIAGNOSIS — C773 Secondary and unspecified malignant neoplasm of axilla and upper limb lymph nodes: Secondary | ICD-10-CM

## 2020-08-04 DIAGNOSIS — C50919 Malignant neoplasm of unspecified site of unspecified female breast: Secondary | ICD-10-CM

## 2020-08-04 DIAGNOSIS — A6003 Herpesviral cervicitis: Secondary | ICD-10-CM

## 2020-08-04 DIAGNOSIS — B009 Herpesviral infection, unspecified: Secondary | ICD-10-CM

## 2020-08-04 DIAGNOSIS — B002 Herpesviral gingivostomatitis and pharyngotonsillitis: Secondary | ICD-10-CM

## 2020-08-04 MED ORDER — FLUCONAZOLE 100 MG PO TABS: 100.0000 mg | ORAL_TABLET | Freq: Every day | ORAL | 0 refills | Status: DC

## 2020-08-04 MED ORDER — VALACYCLOVIR HCL 1 G PO TABS: ORAL_TABLET | ORAL | 4 refills | Status: DC

## 2020-08-04 MED ORDER — MAGIC MOUTHWASH: 5.0000 mL | Freq: Three times a day (TID) | ORAL | 4 refills | Status: DC | PRN

## 2020-08-04 NOTE — Progress Notes (Signed)
April [name] came in today for an unscheduled visit.  She was having more problems with her mouth.  She is continuing on valacyclovir, but has run out of Magic mouthwash and she is not taking Diflucan.  I refilled her medications and make sure that we have an ID consult pending since this is becoming rather difficult to control and is impacting her breast cancer treatment.

## 2020-08-04 NOTE — Telephone Encounter (Signed)
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

## 2020-08-05 ENCOUNTER — Telehealth: Payer: Self-pay

## 2020-08-05 NOTE — Telephone Encounter (Signed)
Oral Oncology Patient Advocate Encounter  Met patient in [place] to complete application for [employer] in an effort to reduce patient's out of pocket expense for I[facility] to $0.    Application completed and faxed to [phone].   [employer] patient assistance phone number for follow up is [phone].   This encounter will be updated until final determination.   [place] Patient [place] Phone [phone] Fax [phone] 08/05/2020 10:55 AM

## 2020-08-10 ENCOUNTER — Other Ambulatory Visit: Payer: Self-pay

## 2020-08-10 ENCOUNTER — Encounter: Payer: Self-pay | Admitting: Internal Medicine

## 2020-08-10 ENCOUNTER — Ambulatory Visit (INDEPENDENT_AMBULATORY_CARE_PROVIDER_SITE_OTHER): Payer: Medicaid Other | Admitting: Internal Medicine

## 2020-08-10 DIAGNOSIS — K121 Other forms of stomatitis: Secondary | ICD-10-CM | POA: Diagnosis present

## 2020-08-10 NOTE — Assessment & Plan Note (Addendum)
The cause of her stomatitis with mouth ulcers is currently unclear.  I obtained a swab culture for herpes today.  If she has developed new onset herpes stomatitis it is quite likely that it is with a strain of herpes simplex that is resistant to valacyclovir.  If so, the most commonly used alternative therapy, foscarnet, could possibly cause significant side effects such as metabolic disturbances, cytopenias and renal insufficiency.  Any potential benefit might not be worth the risk for her.  Given that the stomatitis seems to be temporally related to her new treatment with palbociclib it may be an adverse reaction to the treatment.  Up-to-date indicates that in 1 trial 25% participants developed stomatitis while on palbociclib.  I will call her once I have the results of her herpes culture.  Addendum: A culture of her mouth ulcers was negative for herpes simplex.  She has noted a temporal association between taking palbociclib and development of the ulcers.  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  I told her that she should either decrease her valacyclovir back to 500 mg twice daily or stop it altogether and use it for self-directed therapy of future outbreaks of genital herpes.  She can follow-up here as needed.

## 2020-08-10 NOTE — Progress Notes (Addendum)
[facility]  Reason for Consult: Stomatitis Referring Provider: Dr. Gunnar Bulla Magrinat  Assessment: The cause of her stomatitis with mouth ulcers is currently unclear.  I obtained a swab culture for herpes today.  If she has developed new onset herpes stomatitis it is quite likely that it is with a strain of herpes simplex that is resistant to valacyclovir.  If so, the most commonly used alternative therapy, foscarnet, could possibly cause significant side effects such as metabolic disturbances, cytopenias and renal insufficiency.  Any potential benefit might not be worth the risk for her.  Given that the stomatitis seems to be temporally related to her new treatment with palbociclib it may be an adverse reaction to the treatment.  Up-to-date indicates that in 1 trial 25% participants developed stomatitis while on palbociclib.    Addendum: A culture of her mouth ulcers was negative for herpes simplex.  She has noted a temporal association between taking palbociclib and development of the ulcers.  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  I told her that she should either decrease her valacyclovir back to 500 mg twice daily or stop it altogether and use it for self-directed therapy of future outbreaks of genital herpes.  She can follow-up here as needed.  Plan: 1. I will call her once I have the results of her herpes culture.    Patient Active Problem List   Diagnosis Date Noted  . Stomatitis 08/10/2020    Priority: High  . Breast cancer metastasized to axillary lymph node ([place]) 06/21/2016    Priority: Medium  . Cancer ([place])   . Varicose veins of bilateral lower extremities with other complications 62/13/0865  . Dry skin dermatitis 07/27/2019  . Appendicitis 11/28/2018  . De Quervain's disease (tenosynovitis) 05/15/2018  . Bilateral carpal tunnel syndrome 05/15/2018  . Neuropathy due to chemotherapeutic  drug ([place]) 02/11/2018  . Peripheral neuropathy 08/13/2017  . Osteoporosis 03/23/2017  . Malignant neoplasm of upper-outer quadrant of right breast in female, estrogen receptor positive ([place]) 06/21/2016  . Elevated blood pressure reading without diagnosis of hypertension 04/28/2016  . Herpes genitalis--since 2005 per patient   . Environmental and seasonal allergies 07/28/2012  . Fibromyalgia   . Anxiety 02/07/2012  . Depression (emotion) 01/04/2012  . Headache-migranes 01/04/2012    Patient's Medications  New Prescriptions   No medications on file  Previous Medications   B COMPLEX VITAMINS TABLET    Take 1 tablet by mouth daily.   BUTALBITAL-ACETAMINOPHEN-CAFFEINE (FIORICET, ESGIC) 50-325-40 MG TABLET    TAKE 2 TABLETS BY MOUTH EVERY 6 HOURS AS NEEDED FOR HEADACHE/MIGRAINE   CALCIUM-VITAMIN D-VITAMIN K PO    Take 1 tablet by mouth daily.    CETIRIZINE (ZYRTEC) 10 MG TABLET    Take 10 mg by mouth daily.   DENOSUMAB (PROLIA) 60 MG/ML SOSY INJECTION    Inject 60 mg into the skin every 6 (six) months.   FLUCONAZOLE (DIFLUCAN) 100 MG TABLET    Take 1 tablet (100 mg total) by mouth daily.   FULVESTRANT (FASLODEX IM)    Inject 500 mg into the muscle every 30 (thirty) days. Starting Monthly with next dose. Injection given at [facility].   MAGIC MOUTHWASH SOLN    Take 5 mLs by mouth 3 (three) times daily as needed for mouth pain.   PALBOCICLIB (IBRANCE) 125 MG TABLET    Take 1 tablet (125 mg total) by mouth daily.  Take for 21 days on, 7 days off, repeat every 28 days.   PROBIOTIC PRODUCT (PROBIOTIC DAILY PO)    Take 1 tablet by mouth daily. 1 daily    VALACYCLOVIR (VALTREX) 1000 MG TABLET    Take 1 tablet TID   VITAMIN B-12 (CYANOCOBALAMIN) 1000 MCG TABLET    Take 1,000 mcg by mouth daily.  Modified Medications   No medications on file  Discontinued Medications   No medications on file    HPI: April [name] is a 59 y.o. female with a history of metastatic breast cancer currently on chemotherapy.   She also has a history of genital herpes and has been on chronic suppressive valacyclovir 500 mg twice daily for nearly 10 years.  She recently started on palbociclib for her breast cancer.  Her initial round started in mid July and consisted of 125 mg daily for 3 weeks.  Shortly after starting it she noted some ulcers on her lips, itching, numbness and burning pain of her lips, tongue and throat.  She was instructed to double her valacyclovir dose to 1000 mg twice daily.  She did not notice any improvement.  She has also been trying a variety of other agents such as Magic mouthwash, viscous lidocaine, naproxen and over-the-counter agents such as canker coverage and Azo with only limited relief.  She has also noted a very dry mouth.  She noted that the ulcers and discomfort got much better after she completed her first round of palbociclib and resolved completely at the end of her 2-week hiatus.  She started back on a reduced dose of 125 mg every other day on 07/23/2020.  The same symptoms have now recurred.  She has 2 more doses remaining on her second round of therapy.  She lost about 6 pounds but now has gained that back.  Review of Systems: Review of Systems  Constitutional: Positive for weight loss. Negative for chills, diaphoresis and fever.  HENT: Positive for sore throat.        As noted in HPI.      Past Medical History:  Diagnosis Date  . Allergy 07/28/2012   Seasonal/Environmental allergies  . Anxiety 2013   Since 2013  . Arthritis 2014 per patient    knees and shoulders  . Bilateral ankle fractures 07/2015   Booted and resolved   . Cancer [facility]) dx June 22, 2016   right breast  . Depression 2013   Multiple  episodes  in past.  . Elevated cholesterol 2017  . Fibromyalgia 2013   diagnosed by Dr. [name]  . Genital herpes 2005   Has outbreaks monthly if not on preventative medication  . GERD (gastroesophageal reflux disease) 2013  . History of radiation therapy 02/07/17-  03/21/17   Right Breast- 4 field 25 fractions. 50 Gy to SCLV/PAB in 25 fractions. Right Breast Boost 10 gy in 5 fractions.  . Migraine 2013   migraines  . Neuromuscular disorder ([place]) 03/20/2017   neuropathy in fingers and toes from Chemo--intermittent  . Obesity   . Osteoporosis 03/23/2017   noted per bone density scan  . Peripheral neuropathy 08/13/2017  . Personal history of chemotherapy 11/2016  . Personal history of radiation therapy    4/18  . Right wrist fracture 06/2015   Resolved  . Scoliosis of thoracic spine 01/04/2012  . Skin condition 2012   patient reports periodic episodes of severe itching. She will itch and then blister at areas including her arms, back, and buttocks.   Marland Kitchen  Urinary, incontinence, stress female 07/14/2016   patient reported    Social History   Tobacco Use  . Smoking status: Former Smoker    Packs/day: 0.50    Years: 15.00    Pack years: 7.50    Types: Cigarettes    Quit date: 01/21/1994    Years since quitting: 26.5  . Smokeless tobacco: Never Used  Vaping Use  . Vaping Use: Never used  Substance Use Topics  . Alcohol use: Yes    Alcohol/week: 2.0 - 4.0 standard drinks    Types: 2 - 4 Standard drinks or equivalent per week    Comment: rarely  . Drug use: Not Currently    Types: Marijuana    Comment: last smoked 6 months ago    Family History  Problem Relation Age of Onset  . Arthritis Mother   . Hypertension Mother   . Heart disease Mother   . Dementia Mother   . Irritable bowel syndrome Mother   . Emphysema Father   . Cancer Father        bladder  . Cerebral aneurysm Father        ruptured aneurysm was cause of death  . Bipolar disorder Daughter        Not clear if this is the case.  Possibly Bipolar II  . Depression Daughter   . Graves' disease Sister   . Vitiligo Sister   . Mental illness Brother        Depression  . Mental illness Sister        likely undiagnosed schizophrenia  . Mental illness Brother         Schizophrenia   Allergies  Allergen Reactions  . Cymbalta [Duloxetine Hcl] Other (See Comments)    Causes sores under arm  . Hydrocodone Nausea Only and Other (See Comments)    dizziness  . Ultram [Tramadol Hcl] Nausea Only  . Venlafaxine Other (See Comments)    Causes sores on arm  . Gabapentin Rash    OBJECTIVE: Vitals:   08/10/20 1004  BP: (!) 141/90  Pulse: 73  Temp: 98 F (36.7 C)  TempSrc: Oral  Weight: 220 lb (99.8 kg)   Body mass index is 37.76 kg/m.   Physical Exam Constitutional:      Comments: She is very pleasant and in no distress.  HENT:     Mouth/Throat:     Mouth: Mucous membranes are moist.     Comments: She has 2 small ulcers on the right side of the tip of her tongue.  She does not have any other, no oral mucosal lesions.  There is no evidence of thrush. Psychiatric:        Mood and Affect: Mood normal.     Microbiology: No results found for this or any previous visit (from the past 240 hour(s)).  Michel Bickers, MD [facility] for Infectious [place] [facility] [phone] pager   [phone] cell 08/10/2020, 11:13 AM

## 2020-08-10 NOTE — Addendum Note (Signed)
Addended by: Michel Bickers on: 08/10/2020 03:09 PM   Modules accepted: Orders

## 2020-08-11 ENCOUNTER — Inpatient Hospital Stay: Payer: Medicaid Other | Attending: Oncology | Admitting: Hematology and Oncology

## 2020-08-11 ENCOUNTER — Other Ambulatory Visit: Payer: Self-pay

## 2020-08-11 DIAGNOSIS — M810 Age-related osteoporosis without current pathological fracture: Secondary | ICD-10-CM | POA: Diagnosis not present

## 2020-08-11 DIAGNOSIS — Z79818 Long term (current) use of other agents affecting estrogen receptors and estrogen levels: Secondary | ICD-10-CM | POA: Diagnosis not present

## 2020-08-11 DIAGNOSIS — Z923 Personal history of irradiation: Secondary | ICD-10-CM | POA: Diagnosis not present

## 2020-08-11 DIAGNOSIS — Z79899 Other long term (current) drug therapy: Secondary | ICD-10-CM | POA: Diagnosis not present

## 2020-08-11 DIAGNOSIS — Z170 Estrogen receptor positive status [ER+]: Secondary | ICD-10-CM

## 2020-08-11 DIAGNOSIS — M419 Scoliosis, unspecified: Secondary | ICD-10-CM | POA: Diagnosis not present

## 2020-08-11 DIAGNOSIS — C50411 Malignant neoplasm of upper-outer quadrant of right female breast: Secondary | ICD-10-CM | POA: Diagnosis not present

## 2020-08-11 DIAGNOSIS — Z9221 Personal history of antineoplastic chemotherapy: Secondary | ICD-10-CM | POA: Diagnosis not present

## 2020-08-11 DIAGNOSIS — Z87891 Personal history of nicotine dependence: Secondary | ICD-10-CM | POA: Diagnosis not present

## 2020-08-11 NOTE — Assessment & Plan Note (Signed)
Metastatic breast cancer Current treatment: Ibrance with Faslodex  Reason for today's visit: Pain and swelling of the right arm, concern for cellulitis On examination there was no evidence of erythema or findings of cellulitis on the arm. I believe that she was mowing her lawn with a push mower and she sprained her muscles in the right arm.  I instructed her to use over-the-counter medications to help alleviate the pain and discomfort. There is no indication for antibiotic use. I discussed with her that she could be referred to lymphedema clinic for physical therapy but she was not interested.

## 2020-08-11 NOTE — Progress Notes (Signed)
Patient Care Team: [name], MD as PCP - General (Internal Medicine) Magrinat, Virgie Dad, MD as Consulting Physician (Oncology) [name], MD as Attending Physician (Radiation Oncology) Excell Seltzer, MD (Inactive) as Consulting Physician (General Surgery) Causey, [name], NP as Nurse Practitioner (Hematology and Oncology) [name], MD as Consulting Physician (General Surgery) [name], DPM as Consulting Physician (Podiatry) [name], MD as Consulting Physician (Gastroenterology)  DIAGNOSIS:  Encounter Diagnosis  Name Primary?   Malignant neoplasm of upper-outer quadrant of right breast in female, estrogen receptor positive ([place])     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[place])  06/21/2016 Initial Diagnosis   Breast cancer metastasized to axillary lymph node ([place])   Malignant neoplasm of upper-outer quadrant of right breast in female, estrogen receptor positive ([place])  06/21/2016 Initial Diagnosis   Malignant neoplasm of upper-outer quadrant of right breast in female, estrogen receptor positive ([place])   06/21/2016 Initial Biopsy   Right breast biopsy, 10 oclock: IDC, grade 3, ER+(95%), PR+(80%),Ki67 20%, HER-2 negative (ratio 0.67). Right axilla core biopsy: carcinoma, grade 3, ER+(100%), PR+(90%), Ki67 25%, HER-2 negative (ratio 1.13).    07/17/2016 - 11/06/2016 Neo-Adjuvant Chemotherapy   Received 2 cycles of Doxorubicin and Cyclophosphamide, then transitioned to weekly Paclitaxel (due to repeated febrile neutropenia) x 7 cycles, stopped early due to peripheral neuropathy, then completed her final 2 cycles of Doxorubicin and Cyclophosphamide.    12/19/2016 Surgery   Right breast lumpectomy ([place]): IDC, grade 2, 1.6cm and 0.3cm, margins negative, 3 SLN positive for metastatic carcinoma.     12/26/2016 Surgery   ALND: metastatic carcinoma in one of 20 lymph nodes, and three  nodes from previous lumpectomy.  Four positive nodes, consistent with pN2a.   02/07/2017 - 03/21/2017 Radiation Therapy   Adjuvant radiation Isidore Moos): 1) Right breast and nodes - 4 field: 50 Gy in 25 fractions. IM NODES: >95% receive at least 45Gy/40f. 50Gy to SCLV/PAB @ 2Gy /fraction x 25 fractions. 2) Right breast boost: 10 Gy in 5 fractions   04/2017 -  Anti-estrogen oral therapy   Anastrozole 141mdaily.  Bone density 03/23/2017 finds T score of -2.6, osteoporosis, plan to start Prolia following dental clearance Anastrozole stopped 01/16/18 Exemestane 25 mg daily 02/12/18  On [name], trial randomized to endocrine therapy alone     CHIEF COMPLIANT: Urgent visit to rule out cellulitis  INTERVAL HISTORY: April WICKWAREs a [age]ear old with above-mentioned for metastatic breast cancer who is currently on treatment palbociclib with Faslodex.  She came in urgently complaining that she worried that she may develop cellulitis of the arm.  She noticed a pain and discomfort in the right forearm.  She thought her arm was slightly erythematous.  On examination there was no erythema.  There is tenderness to palpation but there is no warmth or significant edema.   ALLERGIES:  is allergic to cymbalta [duloxetine hcl], hydrocodone, ultram [tramadol hcl], venlafaxine, and gabapentin.  MEDICATIONS:  Current Outpatient Medications  Medication Sig Dispense Refill   b complex vitamins tablet Take 1 tablet by mouth daily.     butalbital-acetaminophen-caffeine (FIORICET, ESGIC) 50-325-40 MG tablet TAKE 2 TABLETS BY MOUTH EVERY 6 HOURS AS NEEDED FOR HEADACHE/MIGRAINE (Patient taking differently: Take 2 tablets by mouth every 6 (six) hours as needed for headache or migraine. ) 14 tablet 0   CALCIUM-VITAMIN D-VITAMIN K PO Take 1 tablet by mouth daily.      cetirizine (ZYRTEC) 10 MG tablet  Take 10 mg by mouth daily. (Patient not taking: Reported on 08/10/2020)     denosumab (PROLIA) 60 MG/ML SOSY injection Inject  60 mg into the skin every 6 (six) months.     fluconazole (DIFLUCAN) 100 MG tablet Take 1 tablet (100 mg total) by mouth daily. (Patient not taking: Reported on 08/10/2020) 10 tablet 0   Fulvestrant (FASLODEX IM) Inject 500 mg into the muscle every 30 (thirty) days. Starting Monthly with next dose. Injection given at [facility].     magic mouthwash SOLN Take 5 mLs by mouth 3 (three) times daily as needed for mouth pain. 240 mL 4   palbociclib (IBRANCE) 125 MG tablet Take 1 tablet (125 mg total) by mouth daily. Take for 21 days on, 7 days off, repeat every 28 days. 21 tablet 6   Probiotic Product (PROBIOTIC DAILY PO) Take 1 tablet by mouth daily. 1 daily      valACYclovir (VALTREX) 1000 MG tablet Take 1 tablet TID 90 tablet 4   vitamin B-12 (CYANOCOBALAMIN) 1000 MCG tablet Take 1,000 mcg by mouth daily.     No current facility-administered medications for this visit.    PHYSICAL EXAMINATION: ECOG PERFORMANCE STATUS: 2 - Symptomatic, <50% confined to bed  Vitals:   08/11/20 1134  BP: 130/77  Pulse: 68  Resp: 18  Temp: (!) 97.4 F (36.3 C)  SpO2: 99%   Filed Weights   08/11/20 1134  Weight: 216 lb 3.2 oz (98.1 kg)      LABORATORY DATA:  I have reviewed the data as listed CMP Latest Ref Rng & Units 07/29/2020 07/22/2020 07/15/2020  Glucose 70 - 99 mg/dL 84 83 99  BUN 6 - 20 mg/dL _0 Creatinine 0.44 - 1.00 mg/dL 0.95 0.79 0.83  Sodium 135 - 145 mmol/L 139 140 140  Potassium 3.5 - 5.1 mmol/L 3.7 3.8 3.9  Chloride 98 - 111 mmol/L 108 111 111  CO2 22 - 32 mmol/L 24 21(L) 21(L)  Calcium 8.9 - 10.3 mg/dL 9.4 8.7(L) 9.2  Total Protein 6.5 - 8.1 g/dL 6.7 7.1 6.7  Total Bilirubin 0.3 - 1.2 mg/dL 0.4 0.4 0.3  Alkaline Phos 38 - 126 U/L 84 101 102  AST 15 - 41 U/L 12(L) 16 14(L)  ALT 0 - 44 U/L _1 Lab Results  Component Value Date   WBC 3.8 (L) 07/29/2020   HGB 12.7 07/29/2020   HCT 36.4 07/29/2020   MCV 102.5 (H) 07/29/2020   PLT 334 07/29/2020   NEUTROABS 2.5  07/29/2020    ASSESSMENT & PLAN:  Malignant neoplasm of upper-outer quadrant of right breast in female, estrogen receptor positive ([place]) Metastatic breast cancer Current treatment: Ibrance with Faslodex  Reason for today's visit: Pain and swelling of the right arm, concern for cellulitis On examination there was no evidence of erythema or findings of cellulitis on the arm. I believe that she was mowing her lawn with a push mower and she sprained her muscles in the right arm.  I instructed her to use over-the-counter medications to help alleviate the pain and discomfort. There is no indication for antibiotic use. I discussed with her that she could be referred to lymphedema clinic for physical therapy but she was not interested.      No orders of the defined types were placed in this encounter.  The patient has a good understanding of the overall plan. she agrees with it. she will call with any problems that may develop  before the next visit here. Total time spent: 30 mins including face to face time and time spent for planning, charting and co-ordination of care   Harriette Ohara, MD 08/11/20

## 2020-08-12 ENCOUNTER — Ambulatory Visit: Payer: Medicaid Other | Admitting: Adult Health

## 2020-08-12 ENCOUNTER — Telehealth: Payer: Self-pay | Admitting: Hematology and Oncology

## 2020-08-12 ENCOUNTER — Ambulatory Visit: Payer: Medicaid Other

## 2020-08-12 ENCOUNTER — Other Ambulatory Visit: Payer: Self-pay | Admitting: *Deleted

## 2020-08-12 ENCOUNTER — Other Ambulatory Visit: Payer: Medicaid Other

## 2020-08-12 DIAGNOSIS — C50411 Malignant neoplasm of upper-outer quadrant of right female breast: Secondary | ICD-10-CM

## 2020-08-12 LAB — HERPES SIMPLEX VIRUS CULTURE
MICRO NUMBER:: 10917900
SPECIMEN QUALITY:: ADEQUATE

## 2020-08-12 MED ORDER — PALBOCICLIB 100 MG PO CAPS: 100.0000 mg | ORAL_CAPSULE | Freq: Every day | ORAL | 3 refills | Status: DC

## 2020-08-12 NOTE — Telephone Encounter (Signed)
Patient is approved for Ibrance at no cost through [employer] 08/12/20-12/03/20.

## 2020-08-12 NOTE — Telephone Encounter (Signed)
No 9/8 los, no changes made to pt schedule

## 2020-08-18 ENCOUNTER — Ambulatory Visit (HOSPITAL_COMMUNITY)
Admission: RE | Admit: 2020-08-18 | Discharge: 2020-08-18 | Disposition: A | Discharge status: Home / Self Care | Payer: Medicaid Other | Source: Ambulatory Visit | Attending: Adult Health | Admitting: Adult Health

## 2020-08-18 ENCOUNTER — Inpatient Hospital Stay (HOSPITAL_BASED_OUTPATIENT_CLINIC_OR_DEPARTMENT_OTHER): Payer: Medicaid Other | Admitting: Adult Health

## 2020-08-18 ENCOUNTER — Inpatient Hospital Stay: Payer: Medicaid Other

## 2020-08-18 ENCOUNTER — Other Ambulatory Visit: Payer: Self-pay

## 2020-08-18 ENCOUNTER — Other Ambulatory Visit: Payer: Self-pay | Admitting: Pharmacist

## 2020-08-18 VITALS — BP 105/55 | HR 82 | Temp 97.4°F | Resp 18 | Ht 64.0 in | Wt 218.7 lb

## 2020-08-18 DIAGNOSIS — Z170 Estrogen receptor positive status [ER+]: Secondary | ICD-10-CM

## 2020-08-18 DIAGNOSIS — C50919 Malignant neoplasm of unspecified site of unspecified female breast: Secondary | ICD-10-CM | POA: Diagnosis not present

## 2020-08-18 DIAGNOSIS — C50411 Malignant neoplasm of upper-outer quadrant of right female breast: Secondary | ICD-10-CM

## 2020-08-18 DIAGNOSIS — C773 Secondary and unspecified malignant neoplasm of axilla and upper limb lymph nodes: Secondary | ICD-10-CM

## 2020-08-18 DIAGNOSIS — M7989 Other specified soft tissue disorders: Secondary | ICD-10-CM

## 2020-08-18 DIAGNOSIS — M810 Age-related osteoporosis without current pathological fracture: Secondary | ICD-10-CM

## 2020-08-18 LAB — COMPREHENSIVE METABOLIC PANEL
ALT: 10 U/L (ref 0–44)
AST: 15 U/L (ref 15–41)
Albumin: 3.5 g/dL (ref 3.5–5.0)
Alkaline Phosphatase: 70 U/L (ref 38–126)
Anion gap: 4 — ABNORMAL LOW (ref 5–15)
BUN: 12 mg/dL (ref 6–20)
CO2: 27 mmol/L (ref 22–32)
Calcium: 9.1 mg/dL (ref 8.9–10.3)
Chloride: 109 mmol/L (ref 98–111)
Creatinine, Ser: 0.81 mg/dL (ref 0.44–1.00)
GFR calc Af Amer: >60 mL/min (ref >60)
GFR calc non Af Amer: >60 mL/min (ref >60)
Glucose, Bld: 96 mg/dL (ref 70–99)
Potassium: 4 mmol/L (ref 3.5–5.1)
Sodium: 140 mmol/L (ref 135–145)
Total Bilirubin: 0.4 mg/dL (ref 0.3–1.2)
Total Protein: 6.7 g/dL (ref 6.5–8.1)

## 2020-08-18 LAB — CBC WITH DIFFERENTIAL/PLATELET
Abs Immature Granulocytes: 0.01 10*3/uL (ref 0.00–0.07)
Basophils Absolute: 0 10*3/uL (ref 0.0–0.1)
Basophils Relative: 1 %
Eosinophils Absolute: 0.1 10*3/uL (ref 0.0–0.5)
Eosinophils Relative: 4 %
HCT: 36.8 % (ref 36.0–46.0)
Hemoglobin: 12.8 g/dL (ref 12.0–15.0)
Immature Granulocytes: 0 %
Lymphocytes Relative: 28 %
Lymphs Abs: 0.7 10*3/uL (ref 0.7–4.0)
MCH: 36.1 pg — ABNORMAL HIGH (ref 26.0–34.0)
MCHC: 34.8 g/dL (ref 30.0–36.0)
MCV: 103.7 fL — ABNORMAL HIGH (ref 80.0–100.0)
Monocytes Absolute: 0.2 10*3/uL (ref 0.1–1.0)
Monocytes Relative: 8 %
Neutro Abs: 1.6 10*3/uL — ABNORMAL LOW (ref 1.7–7.7)
Neutrophils Relative %: 59 %
Platelets: 196 10*3/uL (ref 150–400)
RBC: 3.55 MIL/uL — ABNORMAL LOW (ref 3.87–5.11)
RDW: 15.3 % (ref 11.5–15.5)
WBC: 2.6 10*3/uL — ABNORMAL LOW (ref 4.0–10.5)
nRBC: 0 % (ref 0.0–0.2)

## 2020-08-18 MED ORDER — PALBOCICLIB 100 MG PO CAPS: 100.0000 mg | ORAL_CAPSULE | Freq: Every day | ORAL | 3 refills | Status: DC

## 2020-08-18 MED ORDER — VALACYCLOVIR HCL 1 G PO TABS: ORAL_TABLET | ORAL | 4 refills | Status: DC

## 2020-08-18 MED ORDER — FULVESTRANT 250 MG/5ML IM SOLN
INTRAMUSCULAR | Status: AC
  Filled 2020-08-18: qty 10

## 2020-08-18 MED ORDER — FULVESTRANT 250 MG/5ML IM SOLN
500.0000 mg | Freq: Once | INTRAMUSCULAR | Status: AC
  Administered 2020-08-18: 500 mg via INTRAMUSCULAR

## 2020-08-18 NOTE — Progress Notes (Signed)
Right upper extremity venous duplex has been completed. Preliminary results can be found in CV Proc through chart review.  Results were given to Porsche at [facility] office.  08/18/20 1:04 PM Carlos Levering RVT
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
## 2020-08-18 NOTE — Progress Notes (Signed)
Oral Oncology Pharmacist Encounter  Notified patient having issues with Ibrance refills. Prescription for Ibrance (palbociclib) "no printed" for [facility] on 08/12/20. Patient enrolled in manufacturer assistance and receives medication through [employer] together. Prescription redirected to [facility] [employer].  Leron Croak, PharmD, BCPS Hematology/Oncology Clinical Pharmacist [facility] [phone] 08/18/2020 1:43 PM

## 2020-08-18 NOTE — Patient Instructions (Signed)
Fulvestrant injection What is this medicine? FULVESTRANT (ful VES trant) blocks the effects of estrogen. It is used to treat breast cancer. This medicine may be used for other purposes; ask your health care provider or pharmacist if you have questions. COMMON BRAND NAME(S): FASLODEX What should I tell my health care provider before I take this medicine? They need to know if you have any of these conditions:  bleeding disorders  liver disease  low blood counts, like low white cell, platelet, or red cell counts  an unusual or allergic reaction to fulvestrant, other medicines, foods, dyes, or preservatives  pregnant or trying to get pregnant  breast-feeding How should I use this medicine? This medicine is for injection into a muscle. It is usually given by a health care professional in a hospital or clinic setting. Talk to your pediatrician regarding the use of this medicine in children. Special care may be needed. Overdosage: If you think you have taken too much of this medicine contact a poison control center or emergency room at once. NOTE: This medicine is only for you. Do not share this medicine with others. What if I miss a dose? It is important not to miss your dose. Call your doctor or health care professional if you are unable to keep an appointment. What may interact with this medicine?  medicines that treat or prevent blood clots like warfarin, enoxaparin, dalteparin, apixaban, dabigatran, and rivaroxaban This list may not describe all possible interactions. Give your health care provider a list of all the medicines, herbs, non-prescription drugs, or dietary supplements you use. Also tell them if you smoke, drink alcohol, or use illegal drugs. Some items may interact with your medicine. What should I watch for while using this medicine? Your condition will be monitored carefully while you are receiving this medicine. You will need important blood work done while you are taking  this medicine. Do not become pregnant while taking this medicine or for at least 1 year after stopping it. Women of child-bearing potential will need to have a negative pregnancy test before starting this medicine. Women should inform their doctor if they wish to become pregnant or think they might be pregnant. There is a potential for serious side effects to an unborn child. Men should inform their doctors if they wish to father a child. This medicine may lower sperm counts. Talk to your health care professional or pharmacist for more information. Do not breast-feed an infant while taking this medicine or for 1 year after the last dose. What side effects may I notice from receiving this medicine? Side effects that you should report to your doctor or health care professional as soon as possible:  allergic reactions like skin rash, itching or hives, swelling of the face, lips, or tongue  feeling faint or lightheaded, falls  pain, tingling, numbness, or weakness in the legs  signs and symptoms of infection like fever or chills; cough; flu-like symptoms; sore throat  vaginal bleeding Side effects that usually do not require medical attention (report to your doctor or health care professional if they continue or are bothersome):  aches, pains  constipation  diarrhea  headache  hot flashes  nausea, vomiting  pain at site where injected  stomach pain This list may not describe all possible side effects. Call your doctor for medical advice about side effects. You may report side effects to FDA at 1-800-FDA-1088. Where should I keep my medicine? This drug is given in a hospital or clinic and will   not be stored at home. NOTE: This sheet is a summary. It may not cover all possible information. If you have questions about this medicine, talk to your doctor, pharmacist, or health care provider.  2020 Elsevier/Gold Standard (2018-02-28 11:34:41)  

## 2020-08-19 ENCOUNTER — Encounter: Payer: Self-pay | Admitting: Adult Health

## 2020-08-19 LAB — CANCER ANTIGEN 27.29: CA 27.29: 113.5 U/mL — ABNORMAL HIGH (ref 0.0–38.6)

## 2020-08-19 MED ORDER — AMOXICILLIN-POT CLAVULANATE 875-125 MG PO TABS: 1.0000 | ORAL_TABLET | Freq: Two times a day (BID) | ORAL | 0 refills | Status: DC

## 2020-08-25 ENCOUNTER — Telehealth: Payer: Self-pay

## 2020-08-25 ENCOUNTER — Ambulatory Visit (HOSPITAL_COMMUNITY): Admission: RE | Admit: 2020-08-25 | Payer: Medicare Other | Source: Ambulatory Visit

## 2020-08-25 NOTE — Telephone Encounter (Signed)
Pt called and states she had to cancel CT d/t insurance not giving PA in time. Pt states she called insurance company who told her she would have to go through Medicare A first, then medicaid would pick up. Message sent to [facility] PA tech to find out if PA has been complete. Pt understands once PA is approved, this nurse will schedule CT accordingly and return call.

## 2020-08-27 ENCOUNTER — Telehealth: Payer: Self-pay

## 2020-08-27 NOTE — Telephone Encounter (Signed)
Pt called stating she has new information from her insurance about how she does not need PA for any testing. Pt requests we create a conference call for clarification of benefits. Message sent to Gaspar Bidding regarding this.

## 2020-08-30 ENCOUNTER — Telehealth: Payer: Self-pay | Admitting: *Deleted

## 2020-08-30 NOTE — Telephone Encounter (Signed)
This RN spoke with pt post message left on VM for [facility]/NP nurse phone stating " chest pain ". Message was left early AM and not retrieved until post lunch. This RN called pt promptly upon receiving message at 130pm.  April [name] states she is having significant chest discomfort described as " very gassy- full feeling - pain in upper right abd - hurts if I squeeze my ribs "  She states she is having general upper back pain " like my back hurts even to wash the dishes "  She stated discomfort " keeps me from taking deep breaths "  She took famotidine and Beano approximately 30 minutes ago.   Daryl denies any radiating pain to left arm, no feelings of clamminess or general SOB. She denies any cough.  She states she is passing gas per rectum " a lot " and that she had a BM earlier today.  This RN discussed possible gas with bloated bowels causing discomfort.  Discussed use of Simethecone for benefit.  Note discomfort started PM yesterday " and bothered me all night " but she did not take any medications until approximately 1 pm today.  This RN discussed above and to monitor for benefit - this RN will call pt back later today for update on her condition.

## 2020-09-03 ENCOUNTER — Other Ambulatory Visit: Payer: Self-pay | Admitting: Adult Health

## 2020-09-03 ENCOUNTER — Ambulatory Visit (HOSPITAL_COMMUNITY)
Admission: RE | Admit: 2020-09-03 | Discharge: 2020-09-03 | Disposition: A | Discharge status: Home / Self Care | Payer: Medicaid Other | Source: Ambulatory Visit | Attending: Adult Health | Admitting: Adult Health

## 2020-09-03 ENCOUNTER — Other Ambulatory Visit: Payer: Self-pay

## 2020-09-03 DIAGNOSIS — M7989 Other specified soft tissue disorders: Secondary | ICD-10-CM | POA: Insufficient documentation

## 2020-09-03 DIAGNOSIS — C773 Secondary and unspecified malignant neoplasm of axilla and upper limb lymph nodes: Secondary | ICD-10-CM

## 2020-09-03 DIAGNOSIS — C50919 Malignant neoplasm of unspecified site of unspecified female breast: Secondary | ICD-10-CM | POA: Insufficient documentation

## 2020-09-03 MED ORDER — IOHEXOL 300 MG/ML  SOLN
75.0000 mL | Freq: Once | INTRAMUSCULAR | Status: AC | PRN
  Administered 2020-09-03: 75 mL via INTRAVENOUS

## 2020-09-03 NOTE — Progress Notes (Signed)
Patient with continued axilla and mid thoracic pain.  Recommended PT referral.  CT scan reviewed and negative for etiology.  Shows improvement of lymph nodes.     Wilber Bihari, NP

## 2020-09-08 ENCOUNTER — Ambulatory Visit: Payer: Medicare Other | Attending: Adult Health | Admitting: Physical Therapy

## 2020-09-08 ENCOUNTER — Other Ambulatory Visit: Payer: Self-pay

## 2020-09-08 ENCOUNTER — Encounter: Payer: Self-pay | Admitting: Physical Therapy

## 2020-09-08 DIAGNOSIS — M6281 Muscle weakness (generalized): Secondary | ICD-10-CM | POA: Insufficient documentation

## 2020-09-08 DIAGNOSIS — M25611 Stiffness of right shoulder, not elsewhere classified: Secondary | ICD-10-CM | POA: Insufficient documentation

## 2020-09-08 DIAGNOSIS — R293 Abnormal posture: Secondary | ICD-10-CM | POA: Diagnosis present

## 2020-09-08 DIAGNOSIS — M25511 Pain in right shoulder: Secondary | ICD-10-CM | POA: Diagnosis present

## 2020-09-08 DIAGNOSIS — M79601 Pain in right arm: Secondary | ICD-10-CM | POA: Insufficient documentation

## 2020-09-08 DIAGNOSIS — I890 Lymphedema, not elsewhere classified: Secondary | ICD-10-CM | POA: Insufficient documentation

## 2020-09-08 NOTE — Therapy (Signed)
[place], [place], [address] Phone: [phone]   Fax:  [phone]  Physical Therapy Evaluation  Patient Details  Name: April [name] MRN: [id number] Date of Birth: 1961-05-04 Referring Provider (PT): Causey   Encounter Date: 09/08/2020   PT End of Session - 09/08/20 0951    Visit Number 1    Number of Visits 9    Date for PT Re-Evaluation 10/06/20    Authorization Type Medicaid healthy blue    Authorization - Visit Number 1    Authorization - Number of Visits 27    PT Start Time 0905    PT Stop Time 0948    PT Time Calculation (min) 43 min    Activity Tolerance Patient tolerated treatment well    Behavior During Therapy [facility] for tasks assessed/performed           Past Medical History:  Diagnosis Date  . Allergy 07/28/2012   Seasonal/Environmental allergies  . Anxiety 2013   Since 2013  . Arthritis 2014 per patient    knees and shoulders  . Bilateral ankle fractures 07/2015   Booted and resolved   . Cancer [facility]) dx June 22, 2016   right breast  . Depression 2013   Multiple  episodes  in past.  . Elevated cholesterol 2017  . Fibromyalgia 2013   diagnosed by Dr. [name]  . Genital herpes 2005   Has outbreaks monthly if not on preventative medication  . GERD (gastroesophageal reflux disease) 2013  . History of radiation therapy 02/07/17- 03/21/17   Right Breast- 4 field 25 fractions. 50 Gy to SCLV/PAB in 25 fractions. Right Breast Boost 10 gy in 5 fractions.  . Migraine 2013   migraines  . Neuromuscular disorder ([place]) 03/20/2017   neuropathy in fingers and toes from Chemo--intermittent  . Obesity   . Osteoporosis 03/23/2017   noted per bone density scan  . Peripheral neuropathy 08/13/2017  . Personal history of chemotherapy 11/2016  . Personal history of radiation therapy    4/18  . Right wrist fracture 06/2015   Resolved  . Scoliosis of thoracic spine 01/04/2012  . Skin  condition 2012   patient reports periodic episodes of severe itching. She will itch and then blister at areas including her arms, back, and buttocks.   . Urinary, incontinence, stress female 07/14/2016   patient reported    Past Surgical History:  Procedure Laterality Date  . AXILLARY LYMPH NODE DISSECTION Right 12/26/2016   Procedure: RIGHT AXILLARY LYMPH NODE DISSECTION;  Surgeon: Excell Seltzer, MD;  Location: [place];  Service: General;  Laterality: Right;  . BREAST LUMPECTOMY Right 2018  . BREAST LUMPECTOMY WITH NEEDLE LOCALIZATION Right 12/19/2016   Procedure: RIGHT BREAST NEEDLE LOCALIZED LUMPECTOMY, RIGHT RADIOACTIVE SEED TARGETED AXILLARY SENTINEL LYMPH NODE BIOPSY;  Surgeon: Excell Seltzer, MD;  Location: [place];  Service: General;  Laterality: Right;  . IR GENERIC HISTORICAL  10/09/2016   IR CV LINE INJECTION 10/09/2016 Aletta Edouard, MD [facility]  . LAPAROSCOPIC APPENDECTOMY N/A 11/28/2018   Procedure: APPENDECTOMY LAPAROSCOPIC;  Surgeon: [name], MD;  Location: [place];  Service: General;  Laterality: N/A;  . PORT-A-CATH REMOVAL Left 12/19/2016   Procedure: REMOVAL PORT-A-CATH;  Surgeon: Excell Seltzer, MD;  Location: [place];  Service: General;  Laterality: Left;  . PORTACATH PLACEMENT N/A 07/11/2016   Procedure: INSERTION PORT-A-CATH;  Surgeon: Excell Seltzer, MD;  Location: [facility];  Service: General;  Laterality:  N/A;  . RADIOACTIVE SEED GUIDED AXILLARY SENTINEL LYMPH NODE Right 12/19/2016   Procedure: RADIOACTIVE SEED GUIDED AXILLARY SENTINEL LYMPH NODE BIOPSY;  Surgeon: Excell Seltzer, MD;  Location: [place];  Service: General;  Laterality: Right;  . WISDOM TOOTH EXTRACTION  yrs ago    There were no vitals filed for this visit.    Subjective Assessment - 09/08/20 0910    Subjective My RUE was swelling and they did a doppler which was negative. They put me on antibiotics and  that helped. The arm pit and arm pain has been there since 2018. July 1st it was confirmed that my cancer returned and is stage 4. It has spread to my lung. I have been doing chemo and the tumors have been shrinking.    Pertinent History R breast cancer, R lumpectomy in 01-04-17 with SLNB (3 and some were positive), then had ALNB (20 some were postive), pt completed chemo and radiation, then pt had recurrence in May 2021 and is stage IV, it has spead to lymph nodes and lung    Patient Stated Goals to decrease swelling in arm    Currently in Pain? No/denies              [facility] PT Assessment - 09/08/20 0001      Assessment   Medical Diagnosis right breast cancer    Referring Provider (PT) Causey    Onset Date/Surgical Date 01/04/17    Hand Dominance Right    Prior Therapy none      Precautions   Precautions Other (comment)    Precaution Comments stage IV cancer with mets      Restrictions   Weight Bearing Restrictions No      Balance Screen   Has the patient fallen in the past 6 months No    Has the patient had a decrease in activity level because of a fear of falling?  No    Is the patient reluctant to leave their home because of a fear of falling?  No      Home Environment   Living Environment Private residence    Living Arrangements Children   daughter   Available Help at Discharge Family    Type of [place]      Prior Function   Level of Independence Independent      Cognition   Overall Cognitive Status Within Functional Limits for tasks assessed      Observation/Other Assessments   Observations visible edema in RUE      Posture/Postural Control   Posture/Postural Control Postural limitations    Postural Limitations Rounded Shoulders;Forward head      AROM   Right Shoulder Extension 44 Degrees    Right Shoulder Flexion 95 Degrees    Right Shoulder ABduction 62 Degrees    Right Shoulder Internal Rotation --   not measured due to pain   Right Shoulder External  Rotation --   not measured due to pain   Left Shoulder Extension 62 Degrees    Left Shoulder Flexion 138 Degrees    Left Shoulder ABduction 121 Degrees             LYMPHEDEMA/ONCOLOGY QUESTIONNAIRE - 09/08/20 0001      Type   Cancer Type Right breast cancer      Surgeries   Lumpectomy Date 01/04/17    Sentinel Lymph Node Biopsy Date 01/04/17    Axillary Lymph Node Dissection Date 06/03/17    Number Lymph Nodes Removed 23  some were positive     Date Lymphedema/Swelling Started   Date 12/05/19      Treatment   Active Chemotherapy Treatment Yes    Past Chemotherapy Treatment Yes    Active Radiation Treatment No    Past Radiation Treatment Yes    Current Hormone Treatment Yes    Past Hormone Therapy No      What other symptoms do you have   Are you Having Heaviness or Tightness Yes    Are you having Pain Yes    Are you having pitting edema No    Is it Hard or Difficult finding clothes that fit Yes    Do you have infections Yes    Is there Decreased scar mobility No      Lymphedema Assessments   Lymphedema Assessments Upper extremities      Right Upper Extremity Lymphedema   15 cm Proximal to Olecranon Process 39.5 cm    Olecranon Process 30.7 cm    15 cm Proximal to Ulnar Styloid Process 31.5 cm    Just Proximal to Ulnar Styloid Process 21.5 cm    Across Hand at [employer] 20.8 cm    At [place] of 2nd Digit 6.8 cm      Left Upper Extremity Lymphedema   15 cm Proximal to Olecranon Process 38 cm    Olecranon Process 30.8 cm    15 cm Proximal to Ulnar Styloid Process 28.5 cm    Just Proximal to Ulnar Styloid Process 18.5 cm    Across Hand at [employer] 20 cm    At [place] of 2nd Digit 6.5 cm                   Outpatient Rehab from 09/08/[facility]  Lymphedema Life Impact Scale Total Score 57.35 %      Objective measurements completed on examination: See above findings.               PT  Education - 09/08/20 1002    Education Details anatomy and physiology of lymphatic system    Person(s) Educated Patient    Methods Explanation    Comprehension Verbalized understanding               PT Long Term Goals - 09/08/20 0959      PT LONG TERM GOAL #1   Title Pt will obtain appropriate compression garments for long term management of lymphedmea.    Time 4    Period Weeks    Status New    Target Date 10/06/20      PT LONG TERM GOAL #2   Title Pt will demonstrate 120 degrees of left shoulder flexion to allow her to reach overhead.    Baseline 95    Time 4    Period Weeks    Status New    Target Date 10/06/20      PT LONG TERM GOAL #3   Title Pt will demonstrate 90 degrees of right shoulder abduction to allow her to reach out to the side.    Baseline 62    Time 4    Period Weeks    Status New    Target Date 10/06/20      PT LONG TERM GOAL #4   Title Pt will be independent with a home exercise program for continued strengthening and stretching.    Time 4    Period Weeks    Status New  Target Date 10/06/20      PT LONG TERM GOAL #5   Title Pt will report a 50% improvement in R axillary pain to allow improved comfort.    Time 4    Period Weeks    Status New    Target Date 10/06/20                  Plan - 09/08/20 9242    Clinical Impression Statement Pt presents to PT with R axillary pain, decreased right shoulder ROM, mid thoracic pain and R UE lymphedema. Pt underwent a R lumpectomy and SLNB on 01/04/17 and then had to have an ALND in July. She has undergone chemo and radiation. She recently had recurrence and is now stage IV with mets to her lung. Pt reports she has had decreased R shoulder ROM since 2018. She reports her swelling in her RUE began earlier this year. Pt would benefit from skilled PT services to decrease RUE lymphedema, improve R shoulder ROM, decrease R axillary and thoracic pain.    Personal Factors and Comorbidities Fitness;Time  since onset of injury/illness/exacerbation;Comorbidity 2    Comorbidities scoliosis, previous hx of breast cancer with radiation    Examination-Activity Limitations Reach Overhead;Carry;Lift    Examination-Participation Restrictions Cleaning;Shop;Community Activity;Laundry    Stability/Clinical Decision Making Evolving/Moderate complexity    Clinical Decision Making Moderate    Rehab Potential Good    PT Frequency 2x / week    PT Duration 4 weeks    PT Treatment/Interventions ADLs/Self Care Home Management;Therapeutic exercise;Patient/family education;Manual techniques;Manual lymph drainage;Compression bandaging;Scar mobilization;Passive range of motion;Taping;Vasopneumatic Device    PT Next Visit Plan begin MLD to RUE, gentle PROM - pt has mets to lung, meeks decompression exercises, see if benefits came back from [facility] for sleeve and glove if not covered try exo strong    Recommended Other Services [facility] demographics to [facility]    Consulted and Agree with Plan of Care Patient           Patient will benefit from skilled therapeutic intervention in order to improve the following deficits and impairments:  Pain, Postural dysfunction, Impaired UE functional use, Increased fascial restricitons, Decreased strength, Decreased knowledge of use of DME, Decreased knowledge of precautions, Decreased range of motion, Increased edema  Visit Diagnosis: Lymphedema, not elsewhere classified  Stiffness of right shoulder, not elsewhere classified  Acute pain of right shoulder  Pain in right arm  Muscle weakness (generalized)  Abnormal posture     Problem List Patient Active Problem List   Diagnosis Date Noted  . Stomatitis 08/10/2020  . Cancer ([place])   . Varicose veins of bilateral lower extremities with other complications 68/34/1961  . Dry skin dermatitis 07/27/2019  . Appendicitis 11/28/2018  . De Quervain's disease (tenosynovitis) 05/15/2018  . Bilateral carpal tunnel syndrome  05/15/2018  . 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 Peripheral neuropathy 08/13/2017  . Osteoporosis 03/23/2017  . Breast cancer metastasized to axillary lymph node ([place]) 06/21/2016  . Malignant neoplasm of upper-outer quadrant of right breast in female, estrogen receptor positive ([place]) 06/21/2016  . Elevated blood pressure reading without diagnosis of hypertension 04/28/2016  . Herpes genitalis--since 2005 per patient   . Environmental and seasonal allergies 07/28/2012  . Fibromyalgia   . Anxiety 02/07/2012  . Depression (emotion) 01/04/2012  . Headache-migranes 01/04/2012    Allyson Sabal [facility] 09/08/2020, 10:03 AM  [place], [place], [address] Phone: [phone]   Fax:  [phone]  Name: April [name] MRN: [id number] Date of Birth: 01/25/1961  Manus Gunning, PT 09/08/20 10:03 AM

## 2020-09-09 ENCOUNTER — Inpatient Hospital Stay: Payer: Medicaid Other | Admitting: Adult Health

## 2020-09-09 ENCOUNTER — Other Ambulatory Visit: Payer: Medicaid Other

## 2020-09-09 ENCOUNTER — Ambulatory Visit: Payer: Medicaid Other

## 2020-09-09 ENCOUNTER — Ambulatory Visit: Payer: Medicare Other | Admitting: Rehabilitation

## 2020-09-10 ENCOUNTER — Other Ambulatory Visit: Payer: Self-pay | Admitting: Adult Health

## 2020-09-10 NOTE — Progress Notes (Signed)
Signed Rx by Wilber Bihari, NP for compression items d/t lymphedema faxed to [facility] at fax # [phone]. Fax confirmation received.

## 2020-09-13 ENCOUNTER — Telehealth: Payer: Self-pay

## 2020-09-13 NOTE — Telephone Encounter (Signed)
Pt called to report that she has had an exposure someone who is Covid positive.   Pt is currently asymptomatic.  Pt with upcoming appointments on 10/13.  RN reviewed with staff, patient will need to provide negative Covid test or quarantine for 21 days.   Per patient exposure around the date of 10/7.  Pt will inquire about getting a Covid test.  Will call to update status prior to appointments.

## 2020-09-14 ENCOUNTER — Ambulatory Visit: Payer: Medicaid Other

## 2020-09-14 ENCOUNTER — Telehealth: Payer: Self-pay
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
## 2020-09-14 NOTE — Telephone Encounter (Signed)
Pt called to cancel appt d/t positive COVID exposure. Pt states daughter who lives in the home tested positive for COVID 09/09/20, and symptom onset was 09/04/20. Pt began to feel cold-like symptoms (cough, post-nasal drip, sore throat, h/a) 09/12/20 and was tested 09/12/20, but does not have results as of yet. This LPN advised pt to let us know of her results so that if she is positive we can see about having her set up for monoclonal antibody infusion. Pt also understands if her results are negative and she is in the home with someone positive, she should be retested in 3-5 days. Pt will call back tomorrow with results. Appts for tomorrow have been cancelled and new schedule is TBD.

## 2020-09-15 ENCOUNTER — Inpatient Hospital Stay: Payer: Medicaid Other

## 2020-09-15 ENCOUNTER — Inpatient Hospital Stay: Payer: Medicaid Other | Attending: Oncology | Admitting: Oncology

## 2020-09-15 DIAGNOSIS — I9789 Other postprocedural complications and disorders of the circulatory system, not elsewhere classified: Secondary | ICD-10-CM | POA: Insufficient documentation

## 2020-09-15 DIAGNOSIS — Z170 Estrogen receptor positive status [ER+]: Secondary | ICD-10-CM | POA: Insufficient documentation

## 2020-09-15 DIAGNOSIS — Z20822 Contact with and (suspected) exposure to covid-19: Secondary | ICD-10-CM | POA: Insufficient documentation

## 2020-09-15 DIAGNOSIS — C50411 Malignant neoplasm of upper-outer quadrant of right female breast: Secondary | ICD-10-CM | POA: Insufficient documentation

## 2020-09-15 DIAGNOSIS — I890 Lymphedema, not elsewhere classified: Secondary | ICD-10-CM | POA: Insufficient documentation

## 2020-09-15 DIAGNOSIS — Z79899 Other long term (current) drug therapy: Secondary | ICD-10-CM | POA: Insufficient documentation

## 2020-09-15 DIAGNOSIS — C773 Secondary and unspecified malignant neoplasm of axilla and upper limb lymph nodes: Secondary | ICD-10-CM | POA: Insufficient documentation

## 2020-09-15 DIAGNOSIS — Z79818 Long term (current) use of other agents affecting estrogen receptors and estrogen levels: Secondary | ICD-10-CM | POA: Insufficient documentation

## 2020-09-17 ENCOUNTER — Telehealth: Payer: Self-pay | Admitting: Oncology

## 2020-09-17 NOTE — Telephone Encounter (Signed)
Scheduled per 10/13 los. Called and spoke with pt, confirmed 10/27 appts

## 2020-09-21 ENCOUNTER — Encounter: Payer: Medicaid Other | Admitting: Physical Therapy

## 2020-09-23 ENCOUNTER — Encounter: Payer: Self-pay | Admitting: Rehabilitation

## 2020-09-23 ENCOUNTER — Other Ambulatory Visit: Payer: Self-pay

## 2020-09-23 ENCOUNTER — Ambulatory Visit: Payer: Medicare Other | Admitting: Rehabilitation

## 2020-09-23 DIAGNOSIS — I890 Lymphedema, not elsewhere classified: Secondary | ICD-10-CM

## 2020-09-23 DIAGNOSIS — M25611 Stiffness of right shoulder, not elsewhere classified: Secondary | ICD-10-CM

## 2020-09-23 DIAGNOSIS — R293 Abnormal posture: Secondary | ICD-10-CM

## 2020-09-23 DIAGNOSIS — M79601 Pain in right arm: Secondary | ICD-10-CM

## 2020-09-23 NOTE — Therapy (Signed)
[place], [place], [address] Phone: [phone]   Fax:  [phone]  Physical Therapy Treatment  Patient Details  Name: April [name] MRN: [id number] Date of Birth: [birth year] Referring Provider (PT): Causey   Encounter Date: 09/23/2020   PT End of Session - 09/23/20 1557    Visit Number 2    Number of Visits 9    Date for PT Re-Evaluation 10/06/20    Authorization - Number of Visits 27    PT Start Time 1502    PT Stop Time 1555    PT Time Calculation (min) 53 min    Activity Tolerance Patient tolerated treatment well    Behavior During Therapy [facility] for tasks assessed/performed           Past Medical History:  Diagnosis Date  . Allergy 07/28/2012   Seasonal/Environmental allergies  . Anxiety 2013   Since 2013  . Arthritis 2014 per patient    knees and shoulders  . Bilateral ankle fractures 07/2015   Booted and resolved   . Cancer [facility]) dx June 22, 2016   right breast  . Depression 2013   Multiple  episodes  in past.  . Elevated cholesterol 2017  . Fibromyalgia 2013   diagnosed by Dr. [name]  . Genital herpes 2005   Has outbreaks monthly if not on preventative medication  . GERD (gastroesophageal reflux disease) 2013  . History of radiation therapy 02/07/17- 03/21/17   Right Breast- 4 field 25 fractions. 50 Gy to SCLV/PAB in 25 fractions. Right Breast Boost 10 gy in 5 fractions.  . Migraine 2013   migraines  . Neuromuscular disorder ([place]) 03/20/2017   neuropathy in fingers and toes from Chemo--intermittent  . Obesity   . Osteoporosis 03/23/2017   noted per bone density scan  . Peripheral neuropathy 08/13/2017  . Personal history of chemotherapy 11/2016  . Personal history of radiation therapy    4/18  . Right wrist fracture 06/2015   Resolved  . Scoliosis of thoracic spine 01/04/2012  . Skin condition 2012   patient reports periodic episodes of severe itching. She will itch  and then blister at areas including her arms, back, and buttocks.   . Urinary, incontinence, stress female 07/14/2016   patient reported    Past Surgical History:  Procedure Laterality Date  . AXILLARY LYMPH NODE DISSECTION Right 12/26/2016   Procedure: RIGHT AXILLARY LYMPH NODE DISSECTION;  Surgeon: Excell Seltzer, MD;  Location: [place];  Service: General;  Laterality: Right;  . BREAST LUMPECTOMY Right 2018  . BREAST LUMPECTOMY WITH NEEDLE LOCALIZATION Right 12/19/2016   Procedure: RIGHT BREAST NEEDLE LOCALIZED LUMPECTOMY, RIGHT RADIOACTIVE SEED TARGETED AXILLARY SENTINEL LYMPH NODE BIOPSY;  Surgeon: Excell Seltzer, MD;  Location: [place];  Service: General;  Laterality: Right;  . IR GENERIC HISTORICAL  10/09/2016   IR CV LINE INJECTION 10/09/2016 Aletta Edouard, MD [facility]INTER[facility]  . LAPAROSCOPIC APPENDECTOMY N/A 11/28/2018   Procedure: APPENDECTOMY LAPAROSCOPIC;  Surgeon: [name], MD;  Location: [place];  Service: General;  Laterality: N/A;  . PORT-A-CATH REMOVAL Left 12/19/2016   Procedure: REMOVAL PORT-A-CATH;  Surgeon: Excell Seltzer, MD;  Location: [place];  Service: General;  Laterality: Left;  . PORTACATH PLACEMENT N/A 07/11/2016   Procedure: INSERTION PORT-A-CATH;  Surgeon: Excell Seltzer, MD;  Location: [facility];  Service: General;  Laterality: N/A;  . RADIOACTIVE SEED GUIDED AXILLARY SENTINEL LYMPH NODE Right 12/19/2016   Procedure: RADIOACTIVE  SEED GUIDED AXILLARY SENTINEL LYMPH NODE BIOPSY;  Surgeon: Excell Seltzer, MD;  Location: [place];  Service: General;  Laterality: Right;  . WISDOM TOOTH EXTRACTION  yrs ago    There were no vitals filed for this visit.   Subjective Assessment - 09/23/20 1506    Subjective I have absolute confidence that this will get paid for so it is okay to treat today.  The arm feels better.    Pertinent History R breast cancer, R lumpectomy in 01-04-17 with SLNB (3  and some were positive), then had ALNB (20 some were postive), pt completed chemo and radiation, then pt had recurrence in May 2021 and is stage IV, it has spead to lymph nodes and lung    Patient Stated Goals to decrease swelling in arm    Currently in Pain? Yes    Pain Score 3     Pain Location Elbow    Pain Orientation Left    Pain Descriptors / Indicators Aching    Pain Type Surgical pain    Pain Onset More than a month ago    Pain Frequency Constant    Aggravating Factors  more swelling                       Outpatient Rehab from 09/08/2020 in [place]  Lymphedema Life Impact Scale Total Score 57.35 %            [facility] Adult PT Treatment/Exercise - 09/23/20 0001      Exercises   Exercises Other Exercises    Other Exercises  gave pt handout on remedial UE exercise and advice on elevation      Manual Therapy   Manual Therapy Edema management;Manual Lymphatic Drainage (MLD)    Edema Management gave pt tg soft medium for gentle compression for the Rt UE    Manual Lymphatic Drainage (MLD) focused on teaching self MLD for the Rt UE with all steps performed with vcs/tcs/handout given.  Pt was able to perform all steps well with just initial cueing.  performed in seated.                         PT Long Term Goals - 09/08/20 0959      PT LONG TERM GOAL #1   Title Pt will obtain appropriate compression garments for long term management of lymphedmea.    Time 4    Period Weeks    Status New    Target Date 10/06/20      PT LONG TERM GOAL #2   Title Pt will demonstrate 120 degrees of left shoulder flexion to allow her to reach overhead.    Baseline 95    Time 4    Period Weeks    Status New    Target Date 10/06/20      PT LONG TERM GOAL #3   Title Pt will demonstrate 90 degrees of right shoulder abduction to allow her to reach out to the side.    Baseline 62    Time 4    Period Weeks    Status New     Target Date 10/06/20      PT LONG TERM GOAL #4   Title Pt will be independent with a home exercise program for continued strengthening and stretching.    Time 4    Period Weeks    Status New    Target Date 10/06/20  PT LONG TERM GOAL #5   Title Pt will report a 50% improvement in R axillary pain to allow improved comfort.    Time 4    Period Weeks    Status New    Target Date 10/06/20                 Plan - 09/23/20 1557    Clinical Impression Statement Pt arrives to first PT visit with less edema in the arm subjectively but still with visible fullness in the upper medial arm and forearm.  Pt was scheduled for garment fitting on 10/01/20 with Melissa.  Pt was educated on self MLD today, remedial exercises, and lymphedema prognosis education.    PT Frequency 2x / week    PT Duration 4 weeks    PT Treatment/Interventions ADLs/Self Care Home Management;Therapeutic exercise;Patient/family education;Manual techniques;Manual lymph drainage;Compression bandaging;Scar mobilization;Passive range of motion;Taping;Vasopneumatic Device    PT Next Visit Plan (pt was not covered on this visit by insurance but reports if she calls it will be fixed so check on insurance status) questions on self MLD? how was tg soft?, begin MLD to RUE, gentle PROM - pt has mets to lung, meeks decompression exercises,    Recommended Other Services getting measured 10/01/20 for garments    Consulted and Agree with Plan of Care Patient           Patient will benefit from skilled therapeutic intervention in order to improve the following deficits and impairments:     Visit Diagnosis: Lymphedema, not elsewhere classified  Stiffness of right shoulder, not elsewhere classified  Pain in right arm  Abnormal posture     Problem List Patient Active Problem List   Diagnosis Date Noted  . Stomatitis 08/10/2020  . Cancer ([place])   . Varicose veins of bilateral lower extremities with other complications  17/40/8144  . Dry skin dermatitis 07/27/2019  . Appendicitis 11/28/2018  . De Quervain's disease (tenosynovitis) 05/15/2018  . Bilateral carpal tunnel syndrome 05/15/2018  . Neuropathy due to chemotherapeutic drug ([place]) 02/11/2018  . Peripheral neuropathy 08/13/2017  . Osteoporosis 03/23/2017  . Breast cancer metastasized to axillary lymph node ([place]) 06/21/2016  . Malignant neoplasm of upper-outer quadrant of right breast in female, estrogen receptor positive ([place]) 06/21/2016  . Elevated blood pressure reading without diagnosis of hypertension 04/28/2016  . Herpes genitalis--since 2005 per patient   . Environmental and seasonal allergies 07/28/2012  . Fibromyalgia   . Anxiety 02/07/2012  . Depression (emotion) 01/04/2012  . Headache-migranes 01/04/2012    Stark Bray 09/23/2020, 4:00 PM  [place], [place], [address] Phone: [phone]   Fax:  [phone]  Name: April [name] MRN: [id number] Date of Birth: Apr 19, 1961

## 2020-09-23 NOTE — Patient Instructions (Addendum)
Deep Effective Breath   Standing, sitting, or laying down, place both hands on the belly. Take a deep breath IN, expanding the belly; then breath OUT, contracting the belly.   http://gt2.exer.us/866   Copyright  VHI. All rights reserved.  Axilla to Axilla - Sweep   On uninvolved side make 5 circles in the armpit, then pump _5__ times from involved armpit across chest to uninvolved armpit, making a pathway. Do _1__ time per day.  Copyright  VHI. All rights reserved.  Axilla to Inguinal Nodes - Sweep   On involved side, make 5 circles at groin at panty line, then pump _5__ times from armpit along side of trunk to outer hip, making your other pathway. Do __1_ time per day.  Copyright  VHI. All rights reserved.  Arm Posterior: Elbow to Shoulder - Sweep   Pump _5__ times from back of elbow to top of shoulder. Then inner to outer upper arm _5_ times, then outer arm again _5_ times. Then back to the pathways _2-3_ times. Do _1__ time per day.  Copyright  VHI. All rights reserved.  ARM: [place] Wrist to Elbow - Sweep   Pump or stationary circles _5__ times from wrist to elbow making sure to do both sides of the forearm. Then retrace your steps to the outer arm, and the pathways _2-3_ times each. Do _1__ time per day.  Copyright  VHI. All rights reserved.  ARM: Dorsum of Hand to Shoulder - Sweep   Pump or stationary circles _5__ times on back of hand including knuckle spaces and individual fingers if needed working up towards the wrist, then retrace all your steps working back up the forearm, doing both sides; upper outer arm and back to your pathways _2-3_ times each. Then do 5 circles again at uninvolved armpit and involved groin where you started! Good job!! Do __1_ time per day.  Copyright  VHI. All rights reserved.    Access Code: [email or web address] URL: [email or web address]: 10/21/2021Prepared by: Marcene Brawn TevisExercises  Sawing with Compression Garment - 2 x  daily - 7 x weekly - 15 reps - 1 sets  Scapular Retraction with Compression Garment - 2 x daily - 7 x weekly - 15 reps - 1 sets  Bicep Curls with Compression Garment - 2 x daily - 7 x weekly - 15 reps - 1 sets  Punch Up with Compression Garment - 2 x daily - 7 x weekly - 15 reps - 1 sets  Seated Shoulder Circles with Compression Garment - 2 x daily - 7 x weekly - 15 reps - 1 sets  Wrist Flexion and Extension with Compression Garment - 2 x daily - 7 x weekly - 15 reps - 1 sets Patient Education  Lymphedema: Exercise Considerations

## 2020-09-28 ENCOUNTER — Other Ambulatory Visit: Payer: Self-pay

## 2020-09-28 ENCOUNTER — Ambulatory Visit: Payer: Medicare Other | Admitting: Physical Therapy

## 2020-09-28 ENCOUNTER — Encounter: Payer: Self-pay | Admitting: Physical Therapy

## 2020-09-28 DIAGNOSIS — I890 Lymphedema, not elsewhere classified: Secondary | ICD-10-CM | POA: Diagnosis not present

## 2020-09-28 DIAGNOSIS — M79601 Pain in right arm: Secondary | ICD-10-CM

## 2020-09-28 DIAGNOSIS — M25611 Stiffness of right shoulder, not elsewhere classified: Secondary | ICD-10-CM
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
## 2020-09-28 NOTE — Therapy (Signed)
[place], [place], [address] Phone: [phone]   Fax:  [phone]  Physical Therapy Treatment  Patient Details  Name: April [name] MRN: [id number] Date of Birth: Sep 09, 1961 Referring Provider (PT): Causey   Encounter Date: 09/28/2020   PT End of Session - 09/28/20 1212    Visit Number 3    Number of Visits 9    Date for PT Re-Evaluation 10/06/20    Authorization Type Medicaid healthy blue    Authorization - Visit Number 2    Authorization - Number of Visits 27    PT Start Time 1009    PT Stop Time 1058    PT Time Calculation (min) 49 min    Activity Tolerance Patient tolerated treatment well    Behavior During Therapy [facility] for tasks assessed/performed           Past Medical History:  Diagnosis Date  . Allergy 07/28/2012   Seasonal/Environmental allergies  . Anxiety 2013   Since 2013  . Arthritis 2014 per patient    knees and shoulders  . Bilateral ankle fractures 07/2015   Booted and resolved   . Cancer [facility]) dx June 22, 2016   right breast  . Depression 2013   Multiple  episodes  in past.  . Elevated cholesterol 2017  . Fibromyalgia 2013   diagnosed by Dr. [name]  . Genital herpes 2005   Has outbreaks monthly if not on preventative medication  . GERD (gastroesophageal reflux disease) 2013  . History of radiation therapy 02/07/17- 03/21/17   Right Breast- 4 field 25 fractions. 50 Gy to SCLV/PAB in 25 fractions. Right Breast Boost 10 gy in 5 fractions.  . Migraine 2013   migraines  . Neuromuscular disorder ([place]) 03/20/2017   neuropathy in fingers and toes from Chemo--intermittent  . Obesity   . Osteoporosis 03/23/2017   noted per bone density scan  . Peripheral neuropathy 08/13/2017  . Personal history of chemotherapy 11/2016  . Personal history of radiation therapy    4/18  . Right wrist fracture 06/2015   Resolved  . Scoliosis of thoracic spine 01/04/2012  . Skin  condition 2012   patient reports periodic episodes of severe itching. She will itch and then blister at areas including her arms, back, and buttocks.   . Urinary, incontinence, stress female 07/14/2016   patient reported    Past Surgical History:  Procedure Laterality Date  . AXILLARY LYMPH NODE DISSECTION Right 12/26/2016   Procedure: RIGHT AXILLARY LYMPH NODE DISSECTION;  Surgeon: Excell Seltzer, MD;  Location: [place];  Service: General;  Laterality: Right;  . BREAST LUMPECTOMY Right 2018  . BREAST LUMPECTOMY WITH NEEDLE LOCALIZATION Right 12/19/2016   Procedure: RIGHT BREAST NEEDLE LOCALIZED LUMPECTOMY, RIGHT RADIOACTIVE SEED TARGETED AXILLARY SENTINEL LYMPH NODE BIOPSY;  Surgeon: Excell Seltzer, MD;  Location: [place];  Service: General;  Laterality: Right;  . IR GENERIC HISTORICAL  10/09/2016   IR CV LINE INJECTION 10/09/2016 Aletta Edouard, MD [facility]  . LAPAROSCOPIC APPENDECTOMY N/A 11/28/2018   Procedure: APPENDECTOMY LAPAROSCOPIC;  Surgeon: [name], MD;  Location: [place];  Service: General;  Laterality: N/A;  . PORT-A-CATH REMOVAL Left 12/19/2016   Procedure: REMOVAL PORT-A-CATH;  Surgeon: Excell Seltzer, MD;  Location: [place];  Service: General;  Laterality: Left;  . PORTACATH PLACEMENT N/A 07/11/2016   Procedure: INSERTION PORT-A-CATH;  Surgeon: Excell Seltzer, MD;  [facility];  Service: General;  Laterality:  N/A;  . RADIOACTIVE SEED GUIDED AXILLARY SENTINEL LYMPH NODE Right 12/19/2016   Procedure: RADIOACTIVE SEED GUIDED AXILLARY SENTINEL LYMPH NODE BIOPSY;  Surgeon: Excell Seltzer, MD;  Location: [place];  Service: General;  Laterality: Right;  . WISDOM TOOTH EXTRACTION  yrs ago    There were no vitals filed for this visit.   Subjective Assessment - 09/28/20 1009    Subjective The arm has been getting pretty swollen. I have noticed the massage that she gave me to do does  help. I do it before bed.    Pertinent History R breast cancer, R lumpectomy in 01-04-17 with SLNB (3 and some were positive), then had ALNB (20 some were postive), pt completed chemo and radiation, then pt had recurrence in May 2021 and is stage IV, it has spead to lymph nodes and lung    Patient Stated Goals to decrease swelling in arm    Currently in Pain? No/denies    Pain Score 0-No pain              [facility] PT Assessment - 09/28/20 0001      AROM   Right Shoulder Flexion 147 Degrees    Right Shoulder ABduction 105 Degrees                   Outpatient [facility]  Lymphedema Life Impact Scale Total Score 57.35 %            [facility] Adult PT Treatment/Exercise - 09/28/20 0001      Manual Therapy   Manual Therapy Soft tissue mobilization;Passive ROM    Soft tissue mobilization in L s/l to R serratus anterior, latissimus and external rotators with increased tightness and discomfort noted at beginning of session that improved greatly by end of session and allowed pt to have greater ROM    Passive ROM in to flexion and abduction with distraction to avoid shoulder pain - pt's ROM improved drastically this session compared to eval                       PT Long Term Goals - 09/08/20 0959      PT LONG TERM GOAL #1   Title Pt will obtain appropriate compression garments for long term management of lymphedmea.    Time 4    Period Weeks    Status New    Target Date 10/06/20      PT LONG TERM GOAL #2   Title Pt will demonstrate 120 degrees of left shoulder flexion to allow her to reach overhead.    Baseline 95    Time 4    Period Weeks    Status New    Target Date 10/06/20      PT LONG TERM GOAL #3   Title Pt will demonstrate 90 degrees of right shoulder abduction to allow her to reach out to the side.    Baseline 62    Time 4    Period Weeks    Status New    Target Date 10/06/20      PT  LONG TERM GOAL #4   Title Pt will be independent with a home exercise program for continued strengthening and stretching.    Time 4    Period Weeks    Status New    Target Date 10/06/20      PT LONG TERM GOAL #5   Title Pt will report a 50%  improvement in R axillary pain to allow improved comfort.    Time 4    Period Weeks    Status New    Target Date 10/06/20                 Plan - 09/28/20 1212    Clinical Impression Statement Attempted PROM today but pt had increased pain and tenderness in right axilla. Palpation revealed increased muscular tightness in right lateral trunk and lateral/posterior axilla. Performed soft tissue mobilization to this area today using cocoa butter and pt demonstrated great release of tight musculature. Then attempted PROM again with tremendous improvement. At end of session remeasured AROM and it had improved greatly in direction of flexion and abduction compared to eval. Pt reports she felt much better and was able to move her arm more easily.    PT Frequency 2x / week    PT Duration 4 weeks    PT Treatment/Interventions ADLs/Self Care Home Management;Therapeutic exercise;Patient/family education;Manual techniques;Manual lymph drainage;Compression bandaging;Scar mobilization;Passive range of motion;Taping;Vasopneumatic Device    PT Next Visit Plan (pt was not covered on this visit by insurance but reports if she calls it will be fixed so check on insurance status) questions on self MLD? how was tg soft?, begin MLD to RUE, gentle PROM - pt has mets to lung, meeks decompression exercises,    Consulted and Agree with Plan of Care Patient           Patient will benefit from skilled therapeutic intervention in order to improve the following deficits and impairments:  Pain, Postural dysfunction, Impaired UE functional use, Increased fascial restricitons, Decreased strength, Decreased knowledge of use of DME, Decreased knowledge of precautions, Decreased  range of motion, Increased edema  Visit Diagnosis: Stiffness of right shoulder, not elsewhere classified  Pain in right arm     Problem List Patient Active Problem List   Diagnosis Date Noted  . Stomatitis 08/10/2020  . Cancer ([place])   . Varicose veins of bilateral lower extremities with other complications 82/80/0349  . Dry skin dermatitis 07/27/2019  . Appendicitis 11/28/2018  . De Quervain's disease (tenosynovitis) 05/15/2018  . Bilateral carpal tunnel syndrome 05/15/2018  . Neuropathy due to chemotherapeutic drug ([place]) 02/11/2018  . Peripheral neuropathy 08/13/2017  . Osteoporosis 03/23/2017  . Breast cancer metastasized to axillary lymph node ([place]) 06/21/2016  . Malignant neoplasm of upper-outer quadrant of right breast in female, estrogen receptor positive ([place]) 06/21/2016  . Elevated blood pressure reading without diagnosis of hypertension 04/28/2016  . Herpes genitalis--since 2005 per patient   . Environmental and seasonal allergies 07/28/2012  . Fibromyalgia   . Anxiety 02/07/2012  . Depression (emotion) 01/04/2012  . Headache-migranes 01/04/2012    Allyson Sabal [facility] 09/28/2020, 12:15 PM  [place], [place], [address] Phone: [phone]   Fax:  [phone]  Name: ZAKAYLA MARTINEC MRN: [id number] Date of Birth: [birth year]  Manus Gunning, PT 09/28/20 12:15 PM

## 2020-09-29 ENCOUNTER — Other Ambulatory Visit: Payer: Self-pay

## 2020-09-29 ENCOUNTER — Inpatient Hospital Stay: Payer: Medicaid Other

## 2020-09-29 ENCOUNTER — Inpatient Hospital Stay (HOSPITAL_BASED_OUTPATIENT_CLINIC_OR_DEPARTMENT_OTHER): Payer: Medicaid Other | Admitting: Oncology

## 2020-09-29 VITALS — BP 105/72 | HR 74 | Temp 98.1°F | Resp 18 | Ht 64.0 in | Wt 218.1 lb

## 2020-09-29 DIAGNOSIS — C50411 Malignant neoplasm of upper-outer quadrant of right female breast: Secondary | ICD-10-CM | POA: Diagnosis not present

## 2020-09-29 DIAGNOSIS — Z79899 Other long term (current) drug therapy: Secondary | ICD-10-CM | POA: Diagnosis not present

## 2020-09-29 DIAGNOSIS — C773 Secondary and unspecified malignant neoplasm of axilla and upper limb lymph nodes: Secondary | ICD-10-CM

## 2020-09-29 DIAGNOSIS — Z20822 Contact with and (suspected) exposure to covid-19: Secondary | ICD-10-CM | POA: Diagnosis not present

## 2020-09-29 DIAGNOSIS — I9789 Other postprocedural complications and disorders of the circulatory system, not elsewhere classified: Secondary | ICD-10-CM | POA: Diagnosis not present

## 2020-09-29 DIAGNOSIS — Z170 Estrogen receptor positive status [ER+]: Secondary | ICD-10-CM

## 2020-09-29 DIAGNOSIS — M818 Other osteoporosis without current pathological fracture: Secondary | ICD-10-CM | POA: Diagnosis not present

## 2020-09-29 DIAGNOSIS — G609 Hereditary and idiopathic neuropathy, unspecified: Secondary | ICD-10-CM

## 2020-09-29 DIAGNOSIS — C50919 Malignant neoplasm of unspecified site of unspecified female breast: Secondary | ICD-10-CM

## 2020-09-29 DIAGNOSIS — I890 Lymphedema, not elsewhere classified: Secondary | ICD-10-CM | POA: Diagnosis not present

## 2020-09-29 DIAGNOSIS — M810 Age-related osteoporosis without current pathological fracture: Secondary | ICD-10-CM

## 2020-09-29 DIAGNOSIS — Z79818 Long term (current) use of other agents affecting estrogen receptors and estrogen levels: Secondary | ICD-10-CM | POA: Diagnosis not present

## 2020-09-29 LAB — CBC WITH DIFFERENTIAL/PLATELET
Abs Immature Granulocytes: 0.01 10*3/uL (ref 0.00–0.07)
Basophils Absolute: 0.1 10*3/uL (ref 0.0–0.1)
Basophils Relative: 1 %
Eosinophils Absolute: 0.1 10*3/uL (ref 0.0–0.5)
Eosinophils Relative: 2 %
HCT: 37.8 % (ref 36.0–46.0)
Hemoglobin: 13.1 g/dL (ref 12.0–15.0)
Immature Granulocytes: 0 %
Lymphocytes Relative: 19 %
Lymphs Abs: 0.8 10*3/uL (ref 0.7–4.0)
MCH: 36.4 pg — ABNORMAL HIGH (ref 26.0–34.0)
MCHC: 34.7 g/dL (ref 30.0–36.0)
MCV: 105 fL — ABNORMAL HIGH (ref 80.0–100.0)
Monocytes Absolute: 0.5 10*3/uL (ref 0.1–1.0)
Monocytes Relative: 11 %
Neutro Abs: 2.9 10*3/uL (ref 1.7–7.7)
Neutrophils Relative %: 67 %
Platelets: 340 10*3/uL (ref 150–400)
RBC: 3.6 MIL/uL — ABNORMAL LOW (ref 3.87–5.11)
RDW: 14.1 % (ref 11.5–15.5)
WBC: 4.3 10*3/uL (ref 4.0–10.5)
nRBC: 0 % (ref 0.0–0.2)

## 2020-09-29 LAB — COMPREHENSIVE METABOLIC PANEL
ALT: 12 U/L (ref 0–44)
AST: 17 U/L (ref 15–41)
Albumin: 3.5 g/dL (ref 3.5–5.0)
Alkaline Phosphatase: 76 U/L (ref 38–126)
Anion gap: 5 (ref 5–15)
BUN: 18 mg/dL (ref 6–20)
CO2: 25 mmol/L (ref 22–32)
Calcium: 9.2 mg/dL (ref 8.9–10.3)
Chloride: 109 mmol/L (ref 98–111)
Creatinine, Ser: 0.74 mg/dL (ref 0.44–1.00)
GFR, Estimated: >60 mL/min (ref >60)
Glucose, Bld: 96 mg/dL (ref 70–99)
Potassium: 4.1 mmol/L (ref 3.5–5.1)
Sodium: 139 mmol/L (ref 135–145)
Total Bilirubin: 0.4 mg/dL (ref 0.3–1.2)
Total Protein: 6.7 g/dL (ref 6.5–8.1)

## 2020-09-29 MED ORDER — FULVESTRANT 250 MG/5ML IM SOLN
500.0000 mg | INTRAMUSCULAR | Status: DC
  Administered 2020-09-29: 500 mg via INTRAMUSCULAR

## 2020-09-29 MED ORDER — FULVESTRANT 250 MG/5ML IM SOLN
INTRAMUSCULAR | Status: AC
  Filled 2020-09-29: qty 10

## 2020-09-29 NOTE — Patient Instructions (Signed)
Fulvestrant injection What is this medicine? FULVESTRANT (ful VES trant) blocks the effects of estrogen. It is used to treat breast cancer. This medicine may be used for other purposes; ask your health care provider or pharmacist if you have questions. COMMON BRAND NAME(S): FASLODEX What should I tell my health care provider before I take this medicine? They need to know if you have any of these conditions:  bleeding disorders  liver disease  low blood counts, like low white cell, platelet, or red cell counts  an unusual or allergic reaction to fulvestrant, other medicines, foods, dyes, or preservatives  pregnant or trying to get pregnant  breast-feeding How should I use this medicine? This medicine is for injection into a muscle. It is usually given by a health care professional in a hospital or clinic setting. Talk to your pediatrician regarding the use of this medicine in children. Special care may be needed. Overdosage: If you think you have taken too much of this medicine contact a poison control center or emergency room at once. NOTE: This medicine is only for you. Do not share this medicine with others. What if I miss a dose? It is important not to miss your dose. Call your doctor or health care professional if you are unable to keep an appointment. What may interact with this medicine?  medicines that treat or prevent blood clots like warfarin, enoxaparin, dalteparin, apixaban, dabigatran, and rivaroxaban This list may not describe all possible interactions. Give your health care provider a list of all the medicines, herbs, non-prescription drugs, or dietary supplements you use. Also tell them if you smoke, drink alcohol, or use illegal drugs. Some items may interact with your medicine. What should I watch for while using this medicine? Your condition will be monitored carefully while you are receiving this medicine. You will need important blood work done while you are taking  this medicine. Do not become pregnant while taking this medicine or for at least 1 year after stopping it. Women of child-bearing potential will need to have a negative pregnancy test before starting this medicine. Women should inform their doctor if they wish to become pregnant or think they might be pregnant. There is a potential for serious side effects to an unborn child. Men should inform their doctors if they wish to father a child. This medicine may lower sperm counts. Talk to your health care professional or pharmacist for more information. Do not breast-feed an infant while taking this medicine or for 1 year after the last dose. What side effects may I notice from receiving this medicine? Side effects that you should report to your doctor or health care professional as soon as possible:  allergic reactions like skin rash, itching or hives, swelling of the face, lips, or tongue  feeling faint or lightheaded, falls  pain, tingling, numbness, or weakness in the legs  signs and symptoms of infection like fever or chills; cough; flu-like symptoms; sore throat  vaginal bleeding Side effects that usually do not require medical attention (report to your doctor or health care professional if they continue or are bothersome):  aches, pains  constipation  diarrhea  headache  hot flashes  nausea, vomiting  pain at site where injected  stomach pain This list may not describe all possible side effects. Call your doctor for medical advice about side effects. You may report side effects to FDA at 1-800-FDA-1088. Where should I keep my medicine? This drug is given in a hospital or clinic and will   not be stored at home. NOTE: This sheet is a summary. It may not cover all possible information. If you have questions about this medicine, talk to your doctor, pharmacist, or health care provider.  2020 Elsevier/Gold Standard (2018-02-28 11:34:41)  

## 2020-09-30 ENCOUNTER — Ambulatory Visit: Payer: Medicare Other | Admitting: Physical Therapy

## 2020-09-30 ENCOUNTER — Encounter: Payer: Self-pay | Admitting: Physical Therapy

## 2020-09-30 DIAGNOSIS — R293 Abnormal posture: Secondary | ICD-10-CM

## 2020-09-30 DIAGNOSIS — M25611 Stiffness of right shoulder, not elsewhere classified: Secondary | ICD-10-CM

## 2020-09-30 DIAGNOSIS — M25511 Pain in right shoulder: Secondary | ICD-10-CM

## 2020-09-30 DIAGNOSIS — I890 Lymphedema, not elsewhere classified: Secondary | ICD-10-CM | POA: Diagnosis not present

## 2020-09-30 DIAGNOSIS — M79601 Pain in right arm: Secondary | ICD-10-CM

## 2020-09-30 LAB — CANCER ANTIGEN 27.29: CA 27.29: 108.5 U/mL — ABNORMAL HIGH (ref 0.0–38.6)

## 2020-09-30 NOTE — Patient Instructions (Signed)
Shoulder: Flexion (Supine)    With hands shoulder width apart, slowly lower dowel to floor behind head. Do not let elbows bend. Keep back flat. Hold 5-15____ seconds. Repeat _10___ times. Do __2__ sessions per day. CAUTION: Stretch slowly and gently.  Copyright  VHI. All rights reserved.  Shoulder: Abduction (Supine)    With right arm flat on floor, hold dowel in palm. Slowly move arm up to side of head by pushing with opposite arm. Do not let elbow bend. Hold _0___ seconds. Repeat _10___ times. Do _2___ sessions per day. CAUTION: Stretch slowly and gently.  Copyright  VHI. All rights reserved.   Stop if you have any sharp pain or if you feel your pain is worsening.

## 2020-09-30 NOTE — Therapy (Signed)
[place], [place], [address] Phone: [phone]   Fax:  [phone]  Physical Therapy Treatment  Patient Details  Name: April [name] MRN: [id number] Date of Birth: 01/24/1961 Referring Provider (PT): Causey   Encounter Date: 09/30/2020   PT End of Session - 09/30/20 1208    Visit Number 4    Number of Visits 9    Date for PT Re-Evaluation 10/06/20    Authorization - Visit Number 3    Authorization - Number of Visits 27    PT Start Time 1100    PT Stop Time 1200    PT Time Calculation (min) 60 min    Activity Tolerance Patient tolerated treatment well    Behavior During Therapy [facility] for tasks assessed/performed           Past Medical History:  Diagnosis Date  . Allergy 07/28/2012   Seasonal/Environmental allergies  . Anxiety 2013   Since 2013  . Arthritis 2014 per patient    knees and shoulders  . Bilateral ankle fractures 07/2015   Booted and resolved   . Cancer [facility]) dx June 22, 2016   right breast  . Depression 2013   Multiple  episodes  in past.  . Elevated cholesterol 2017  . Fibromyalgia 2013   diagnosed by Dr. [name]  . Genital herpes 2005   Has outbreaks monthly if not on preventative medication  . GERD (gastroesophageal reflux disease) 2013  . History of radiation therapy 02/07/17- 03/21/17   Right Breast- 4 field 25 fractions. 50 Gy to SCLV/PAB in 25 fractions. Right Breast Boost 10 gy in 5 fractions.  . Migraine 2013   migraines  . Neuromuscular disorder ([place]) 03/20/2017   neuropathy in fingers and toes from Chemo--intermittent  . Obesity   . Osteoporosis 03/23/2017   noted per bone density scan  . Peripheral neuropathy 08/13/2017  . Personal history of chemotherapy 11/2016  . Personal history of radiation therapy    4/18  . Right wrist fracture 06/2015   Resolved  . Scoliosis of thoracic spine 01/04/2012  . Skin condition 2012   patient reports periodic  episodes of severe itching. She will itch and then blister at areas including her arms, back, and buttocks.   . Urinary, incontinence, stress female 07/14/2016   patient reported    Past Surgical History:  Procedure Laterality Date  . AXILLARY LYMPH NODE DISSECTION Right 12/26/2016   Procedure: RIGHT AXILLARY LYMPH NODE DISSECTION;  Surgeon: Excell Seltzer, MD;  Location: [place];  Service: General;  Laterality: Right;  . BREAST LUMPECTOMY Right 2018  . BREAST LUMPECTOMY WITH NEEDLE LOCALIZATION Right 12/19/2016   Procedure: RIGHT BREAST NEEDLE LOCALIZED LUMPECTOMY, RIGHT RADIOACTIVE SEED TARGETED AXILLARY SENTINEL LYMPH NODE BIOPSY;  Surgeon: Excell Seltzer, MD;  Location: [place];  Service: General;  Laterality: Right;  . IR GENERIC HISTORICAL  10/09/2016   IR CV LINE INJECTION 10/09/2016 Aletta Edouard, MD [facility]  . LAPAROSCOPIC APPENDECTOMY N/A 11/28/2018   Procedure: APPENDECTOMY LAPAROSCOPIC;  Surgeon: [name], MD;  Location: [place];  Service: General;  Laterality: N/A;  . PORT-A-CATH REMOVAL Left 12/19/2016   Procedure: REMOVAL PORT-A-CATH;  Surgeon: Excell Seltzer, MD;  Location: [place];  Service: General;  Laterality: Left;  . PORTACATH PLACEMENT N/A 07/11/2016   Procedure: INSERTION PORT-A-CATH;  Surgeon: Excell Seltzer, MD;  [facility];  Service: General;  Laterality: N/A;  . RADIOACTIVE SEED [place]  LYMPH NODE Right 12/19/2016   Procedure: RADIOACTIVE SEED GUIDED AXILLARY SENTINEL LYMPH NODE BIOPSY;  Surgeon: Excell Seltzer, MD;  Location: [place];  Service: General;  Laterality: Right;  . WISDOM TOOTH EXTRACTION  yrs ago    There were no vitals filed for this visit.   Subjective Assessment - 09/30/20 1102    Subjective I can raise my arm and turn on my ceiling fan now.    Pertinent History R breast cancer, R lumpectomy in 01-04-17 with SLNB (3 and some were  positive), then had ALNB (20 some were postive), pt completed chemo and radiation, then pt had recurrence in May 2021 and is stage IV, it has spead to lymph nodes and lung    Patient Stated Goals to decrease swelling in arm    Currently in Pain? No/denies    Pain Score 0-No pain                       Outpatient Rehab from 09/08/2020 in [place]  Lymphedema Life Impact Scale Total Score 57.35 %            [facility] Adult PT Treatment/Exercise - 09/30/20 0001      Shoulder Exercises: Supine   Flexion AAROM;10 reps   with dowel with 5 sec holds   ABduction AAROM;Right;10 reps   with dowel with no holds to avoid pain in shoulder     Manual Therapy   Manual Therapy Soft tissue mobilization;Passive ROM    Soft tissue mobilization in supine to R pec especially along clavicle and at axilla in area of tightness    Passive ROM in to flexion and abduction with distraction to avoid shoulder pain - pt's ROM improved drastically this session compared to eval                       PT Long Term Goals - 09/08/20 0959      PT LONG TERM GOAL #1   Title Pt will obtain appropriate compression garments for long term management of lymphedmea.    Time 4    Period Weeks    Status New    Target Date 10/06/20      PT LONG TERM GOAL #2   Title Pt will demonstrate 120 degrees of left shoulder flexion to allow her to reach overhead.    Baseline 95    Time 4    Period Weeks    Status New    Target Date 10/06/20      PT LONG TERM GOAL #3   Title Pt will demonstrate 90 degrees of right shoulder abduction to allow her to reach out to the side.    Baseline 62    Time 4    Period Weeks    Status New    Target Date 10/06/20      PT LONG TERM GOAL #4   Title Pt will be independent with a home exercise program for continued strengthening and stretching.    Time 4    Period Weeks    Status New    Target Date 10/06/20      PT LONG TERM  GOAL #5   Title Pt will report a 50% improvement in R axillary pain to allow improved comfort.    Time 4    Period Weeks    Status New    Target Date 10/06/20  Plan - 09/30/20 1209    Clinical Impression Statement Continued with manual therapy today to release R pec muscle since pt has been having pain across her chest and along her clavicle. Numerous areas of tightness palpable in this area but did lessen with treatment. Pt demonstrated improved ROM following session. Educated pt in supine dowel exercises to begin at home.    PT Frequency 2x / week    PT Duration 4 weeks    PT Treatment/Interventions ADLs/Self Care Home Management;Therapeutic exercise;Patient/family education;Manual techniques;Manual lymph drainage;Compression bandaging;Scar mobilization;Passive range of motion;Taping;Vasopneumatic Device    PT Next Visit Plan (pt was not covered on this visit by insurance but reports if she calls it will be fixed so check on insurance status) questions on self MLD? how was tg soft?, begin MLD to RUE, gentle PROM - pt has mets to lung, meeks decompression exercises,    Consulted and Agree with Plan of Care Patient           Patient will benefit from skilled therapeutic intervention in order to improve the following deficits and impairments:  Pain, Postural dysfunction, Impaired UE functional use, Increased fascial restricitons, Decreased strength, Decreased knowledge of use of DME, Decreased knowledge of precautions, Decreased range of motion, Increased edema  Visit Diagnosis: Stiffness of right shoulder, not elsewhere classified  Pain in right arm  Abnormal posture  Acute pain of right shoulder     Problem List Patient Active Problem List   Diagnosis Date Noted  . Stomatitis 08/10/2020  . Varicose veins of bilateral lower extremities with other complications 59/74/1638  . Dry skin dermatitis 07/27/2019  . Appendicitis 11/28/2018  . De Quervain's  disease (tenosynovitis) 05/15/2018  . Bilateral carpal tunnel syndrome 05/15/2018  . Neuropathy due to chemotherapeutic drug ([place]) 02/11/2018  . Peripheral neuropathy 08/13/2017  . Osteoporosis 03/23/2017  . Breast cancer metastasized to axillary lymph node ([place]) 06/21/2016  . Malignant neoplasm of upper-outer quadrant of right breast in female, estrogen receptor positive ([place]) 06/21/2016  . Elevated blood pressure reading without diagnosis of hypertension 04/28/2016  . Herpes genitalis--since 2005 per patient   . Environmental and seasonal allergies 07/28/2012  . Fibromyalgia   . Anxiety 02/07/2012  . Depression (emotion) 01/04/2012  . Headache-migranes 01/04/2012    Allyson Sabal [facility] 09/30/2020, 12:24 PM  [place], [place], [address] Phone: [phone]   Fax:  [phone]  Name: April [name] MRN: [id number] Date of Birth: [birth year]  Manus Gunning, PT 09/30/20 12:24 PM

## 2020-10-04 ENCOUNTER — Other Ambulatory Visit: Payer: Self-pay

## 2020-10-04 ENCOUNTER — Ambulatory Visit: Payer: Medicaid Other | Attending: Adult Health | Admitting: Physical Therapy

## 2020-10-04 ENCOUNTER — Encounter: Payer: Self-pay | Admitting: Physical Therapy

## 2020-10-04 DIAGNOSIS — M25511 Pain in right shoulder: Secondary | ICD-10-CM | POA: Insufficient documentation

## 2020-10-04 DIAGNOSIS — R293 Abnormal posture: Secondary | ICD-10-CM | POA: Diagnosis present

## 2020-10-04 DIAGNOSIS — C50911 Malignant neoplasm of unspecified site of right female breast: Secondary | ICD-10-CM | POA: Insufficient documentation

## 2020-10-04 DIAGNOSIS — M79601 Pain in right arm: Secondary | ICD-10-CM | POA: Diagnosis present

## 2020-10-04 DIAGNOSIS — M25611 Stiffness of right shoulder, not elsewhere classified: Secondary | ICD-10-CM | POA: Insufficient documentation

## 2020-10-04 DIAGNOSIS — C773 Secondary and unspecified malignant neoplasm of axilla and upper limb lymph nodes: Secondary | ICD-10-CM | POA: Insufficient documentation

## 2020-10-04 DIAGNOSIS — M6281 Muscle weakness (generalized): Secondary | ICD-10-CM | POA: Insufficient documentation

## 2020-10-04 DIAGNOSIS — I890 Lymphedema, not elsewhere classified: Secondary | ICD-10-CM | POA: Insufficient documentation

## 2020-10-04 DIAGNOSIS — M62838 Other muscle spasm: Secondary | ICD-10-CM | POA: Diagnosis present

## 2020-10-04 NOTE — Therapy (Signed)
April [name], [place], [address] Phone: [phone]   Fax:  [phone]  Physical Therapy Treatment  Patient Details  Name: April [name] MRN: [id number] Date of Birth: Jun 27, 1961 Referring Provider (PT): Causey   Encounter Date: 10/04/2020   PT End of Session - 10/04/20 0957    Visit Number 5    Number of Visits 9    Date for PT Re-Evaluation 10/06/20    Authorization Type Medicaid healthy blue    Authorization - Visit Number 4    Authorization - Number of Visits 27    PT Start Time 0907   pt arrived late   PT Stop Time 0957    PT Time Calculation (min) 50 min    Activity Tolerance Patient tolerated treatment well    Behavior During Therapy [facility] for tasks assessed/performed           Past Medical History:  Diagnosis Date  . Allergy 07/28/2012   Seasonal/Environmental allergies  . Anxiety 2013   Since 2013  . Arthritis 2014 per patient    knees and shoulders  . Bilateral ankle fractures 07/2015   Booted and resolved   . Cancer [facility]) dx June 22, 2016   right breast  . Depression 2013   Multiple  episodes  in past.  . Elevated cholesterol 2017  . Fibromyalgia 2013   diagnosed by Dr. [name]  . Genital herpes 2005   Has outbreaks monthly if not on preventative medication  . GERD (gastroesophageal reflux disease) 2013  . History of radiation therapy 02/07/17- 03/21/17   Right Breast- 4 field 25 fractions. 50 Gy to SCLV/PAB in 25 fractions. Right Breast Boost 10 gy in 5 fractions.  . Migraine 2013   migraines  . Neuromuscular disorder ([place]) 03/20/2017   neuropathy in fingers and toes from Chemo--intermittent  . Obesity   . Osteoporosis 03/23/2017   noted per bone density scan  . Peripheral neuropathy 08/13/2017  . Personal history of chemotherapy 11/2016  . Personal history of radiation therapy    4/18  . Right wrist fracture 06/2015   Resolved  . Scoliosis of thoracic spine  01/04/2012  . Skin condition 2012   patient reports periodic episodes of severe itching. She will itch and then blister at areas including her arms, back, and buttocks.   . Urinary, incontinence, stress female 07/14/2016   patient reported    Past Surgical History:  Procedure Laterality Date  . AXILLARY LYMPH NODE DISSECTION Right 12/26/2016   Procedure: RIGHT AXILLARY LYMPH NODE DISSECTION;  Surgeon: Excell Seltzer, MD;  Location: [place];  Service: General;  Laterality: Right;  . BREAST LUMPECTOMY Right 2018  . BREAST LUMPECTOMY WITH NEEDLE LOCALIZATION Right 12/19/2016   Procedure: RIGHT BREAST NEEDLE LOCALIZED LUMPECTOMY, RIGHT RADIOACTIVE SEED TARGETED AXILLARY SENTINEL LYMPH NODE BIOPSY;  Surgeon: Excell Seltzer, MD;  Location: [place];  Service: General;  Laterality: Right;  . IR GENERIC HISTORICAL  10/09/2016   IR CV LINE INJECTION 10/09/2016 Aletta Edouard, MD [facility]  . LAPAROSCOPIC APPENDECTOMY N/A 11/28/2018   Procedure: APPENDECTOMY LAPAROSCOPIC;  Surgeon: [name], MD;  Location: [place];  Service: General;  Laterality: N/A;  . PORT-A-CATH REMOVAL Left 12/19/2016   Procedure: REMOVAL PORT-A-CATH;  Surgeon: Excell Seltzer, MD;  Location: [place];  Service: General;  Laterality: Left;  . PORTACATH PLACEMENT N/A 07/11/2016   Procedure: INSERTION PORT-A-CATH;  Surgeon: Excell Seltzer, MD;  [facility];  Service: General;  Laterality: N/A;  . RADIOACTIVE SEED GUIDED AXILLARY SENTINEL LYMPH NODE Right 12/19/2016   Procedure: RADIOACTIVE SEED GUIDED AXILLARY SENTINEL LYMPH NODE BIOPSY;  Surgeon: Excell Seltzer, MD;  Location: [place];  Service: General;  Laterality: Right;  . WISDOM TOOTH EXTRACTION  yrs ago    There were no vitals filed for this visit.   Subjective Assessment - 10/04/20 0911    Subjective My shoulder is much better. I have doing my exercises and I can tell a  difference. I have been rubbing around my collar bone.    Pertinent History R breast cancer, R lumpectomy in 01-04-17 with SLNB (3 and some were positive), then had ALNB (20 some were postive), pt completed chemo and radiation, then pt had recurrence in May 2021 and is stage IV, it has spead to lymph nodes and lung    Patient Stated Goals to decrease swelling in arm    Currently in Pain? No/denies    Pain Score 0-No pain                       Outpatient Rehab from 09/08/2020 in [place]  Lymphedema Life Impact Scale Total Score 57.35 %            [facility] Adult PT Treatment/Exercise - 10/04/20 0001      Manual Therapy   Manual Therapy Soft tissue mobilization;Passive ROM    Soft tissue mobilization in supine to R pec especially along clavicle and at axilla in area of tightness with cocoa butter and then in left sidelying to R lateral trunk and interior/posterior axilla    Passive ROM in to flexion and abduction and ER with distraction in to abduction and flexion to avoid pain                       PT Long Term Goals - 09/08/20 0959      PT LONG TERM GOAL #1   Title Pt will obtain appropriate compression garments for long term management of lymphedmea.    Time 4    Period Weeks    Status New    Target Date 10/06/20      PT LONG TERM GOAL #2   Title Pt will demonstrate 120 degrees of left shoulder flexion to allow her to reach overhead.    Baseline 95    Time 4    Period Weeks    Status New    Target Date 10/06/20      PT LONG TERM GOAL #3   Title Pt will demonstrate 90 degrees of right shoulder abduction to allow her to reach out to the side.    Baseline 62    Time 4    Period Weeks    Status New    Target Date 10/06/20      PT LONG TERM GOAL #4   Title Pt will be independent with a home exercise program for continued strengthening and stretching.    Time 4    Period Weeks    Status New    Target Date  10/06/20      PT LONG TERM GOAL #5   Title Pt will report a 50% improvement in R axillary pain to allow improved comfort.    Time 4    Period Weeks    Status New    Target Date 10/06/20  Plan - 10/04/20 0959    Clinical Impression Statement Pt continues to demonstrate gains in ROM of R shoulder. She is having decreased pain near clavicle. Continued with manual therapy to this area and less muscle tightness noted today. She still had some tenderness that improved with session. The majority of her tightness and tenderness is in posterior lateral axilla. There is a very tight band in this area that pulls during ROM. Focused on manual therapy to this area. Continued with PROM to R shoulder in all directions.    PT Frequency 2x / week    PT Duration 4 weeks    PT Next Visit Plan give supine scap, (pt was not covered on this visit by insurance but reports if she calls it will be fixed so check on insurance status) questions on self MLD? how was tg soft?, begin MLD to RUE, gentle PROM - pt has mets to lung, meeks decompression exercises,    Consulted and Agree with Plan of Care Patient           Patient will benefit from skilled therapeutic intervention in order to improve the following deficits and impairments:  Pain, Postural dysfunction, Impaired UE functional use, Increased fascial restricitons, Decreased strength, Decreased knowledge of use of DME, Decreased knowledge of precautions, Decreased range of motion, Increased edema  Visit Diagnosis: Stiffness of right shoulder, not elsewhere classified  Pain in right arm  Acute pain of right shoulder     Problem List Patient Active Problem List   Diagnosis Date Noted  . Stomatitis 08/10/2020  . Varicose veins of bilateral lower extremities with other complications 31/49/7026  . Dry skin dermatitis 07/27/2019  . Appendicitis 11/28/2018  . De Quervain's disease (tenosynovitis) 05/15/2018  . Bilateral carpal  tunnel syndrome 05/15/2018  . Neuropathy due to chemotherapeutic drug ([place]) 02/11/2018  . Peripheral neuropathy 08/13/2017  . Osteoporosis 03/23/2017  . Breast cancer metastasized to axillary lymph node ([place]) 06/21/2016  . Malignant neoplasm of upper-outer quadrant of right breast in female, estrogen receptor positive ([place]) 06/21/2016  . Elevated blood pressure reading without diagnosis of hypertension 04/28/2016  . Herpes genitalis--since 2005 per patient   . Environmental and seasonal allergies 07/28/2012  . Fibromyalgia   . Anxiety 02/07/2012  . Depression (emotion) 01/04/2012  . Headache-migranes 01/04/2012    Allyson Sabal [facility] 10/04/2020, 10:11 AM  [place], [place], [address] Phone: [phone]   Fax:  [phone]  Name: LAVILLA DELAMORA MRN: [id number] Date of Birth: June 06, 1961  Manus Gunning, PT 10/04/20 10:12 AM

## 2020-10-07 ENCOUNTER — Other Ambulatory Visit: Payer: Medicaid Other

## 2020-10-07 ENCOUNTER — Encounter: Payer: Medicaid Other | Admitting: Rehabilitation

## 2020-10-07 ENCOUNTER — Ambulatory Visit: Payer: Medicaid Other

## 2020-10-12 ENCOUNTER — Telehealth: Payer: Self-pay | Admitting: Oncology

## 2020-10-12 ENCOUNTER — Telehealth: Payer: Self-pay

## 2020-10-12 ENCOUNTER — Other Ambulatory Visit: Payer: Self-pay | Admitting: Internal Medicine

## 2020-10-12 NOTE — Telephone Encounter (Signed)
Scheduled appt per 11/9 sch msg - pt is aware of appt appts.

## 2020-10-12 NOTE — Telephone Encounter (Signed)
Pt called stating she needs appts going forward pushed out, d/t having to move last appt to 10/27. Message sent to scheduling for them to correct this.

## 2020-10-13 ENCOUNTER — Inpatient Hospital Stay: Payer: Medicaid Other

## 2020-10-13 ENCOUNTER — Inpatient Hospital Stay: Payer: Medicaid Other | Admitting: Adult Health

## 2020-10-19 ENCOUNTER — Other Ambulatory Visit: Payer: Self-pay | Admitting: Internal Medicine

## 2020-10-19 ENCOUNTER — Other Ambulatory Visit: Payer: Self-pay

## 2020-10-19 ENCOUNTER — Ambulatory Visit: Payer: Medicaid Other

## 2020-10-19 DIAGNOSIS — C50911 Malignant neoplasm of unspecified site of right female breast: Secondary | ICD-10-CM

## 2020-10-19 DIAGNOSIS — M25611 Stiffness of right shoulder, not elsewhere classified: Secondary | ICD-10-CM | POA: Diagnosis not present

## 2020-10-19 NOTE — Therapy (Signed)
[place], [place], [address] Phone: [phone]   Fax:  [phone]  Physical Therapy Treatment  Patient Details  Name: April [name] MRN: [id number] Date of Birth: October 28, 1961 Referring Provider (PT): Causey   Encounter Date: 10/19/2020   PT End of Session - 10/19/20 1203    Visit Number 6    Number of Visits 17    Date for PT Re-Evaluation 11/16/20    Authorization Type Medicaid healthy blue    Authorization - Visit Number 5    Authorization - Number of Visits 27    PT Start Time 1109    PT Stop Time 9476    PT Time Calculation (min) 46 min    Activity Tolerance Patient tolerated treatment well    Behavior During Therapy [facility] for tasks assessed/performed           Past Medical History:  Diagnosis Date   Allergy 07/28/2012   Seasonal/Environmental allergies   Anxiety 2013   Since 2013   Arthritis 2014 per patient    knees and shoulders   Bilateral ankle fractures 07/2015   Booted and resolved    Cancer [facility]) dx June 22, 2016   right breast   Depression 2013   Multiple  episodes  in past.   Elevated cholesterol 2017   Fibromyalgia 2013   diagnosed by Dr. [name]   Genital herpes 2005   Has outbreaks monthly if not on preventative medication   GERD (gastroesophageal reflux disease) 2013   History of radiation therapy 02/07/17- 03/21/17   Right Breast- 4 field 25 fractions. 50 Gy to SCLV/PAB in 25 fractions. Right Breast Boost 10 gy in 5 fractions.   Migraine 2013   migraines   Neuromuscular disorder ([place]) 03/20/2017   neuropathy in fingers and toes from Chemo--intermittent   Obesity    Osteoporosis 03/23/2017   noted per bone density scan   Peripheral neuropathy 08/13/2017   Personal history of chemotherapy 11/2016   Personal history of radiation therapy    4/18   Right wrist fracture 06/2015   Resolved   Scoliosis of thoracic spine 01/04/2012   Skin  condition 2012   patient reports periodic episodes of severe itching. She will itch and then blister at areas including her arms, back, and buttocks.    Urinary, incontinence, stress female 07/14/2016   patient reported    Past Surgical History:  Procedure Laterality Date   AXILLARY LYMPH NODE DISSECTION Right 12/26/2016   Procedure: RIGHT AXILLARY LYMPH NODE DISSECTION;  Surgeon: Excell Seltzer, MD;  Location: [place];  Service: General;  Laterality: Right;   BREAST LUMPECTOMY Right 2018   BREAST LUMPECTOMY WITH NEEDLE LOCALIZATION Right 12/19/2016   Procedure: RIGHT BREAST NEEDLE LOCALIZED LUMPECTOMY, RIGHT RADIOACTIVE SEED TARGETED AXILLARY SENTINEL LYMPH NODE BIOPSY;  Surgeon: Excell Seltzer, MD;  Location: [place];  Service: General;  Laterality: Right;   IR GENERIC HISTORICAL  10/09/2016   IR CV LINE INJECTION 10/09/2016 Aletta Edouard, MD [facility]   LAPAROSCOPIC APPENDECTOMY N/A 11/28/2018   Procedure: APPENDECTOMY LAPAROSCOPIC;  Surgeon: [name], MD;  Location: [place];  Service: General;  Laterality: N/A;   PORT-A-CATH REMOVAL Left 12/19/2016   Procedure: REMOVAL PORT-A-CATH;  Surgeon: Excell Seltzer, MD;  Location: [place];  Service: General;  Laterality: Left;   PORTACATH PLACEMENT N/A 07/11/2016   Procedure: INSERTION PORT-A-CATH;  Surgeon: Excell Seltzer, MD;  [facility];  Service: General;  Laterality:  N/A;   RADIOACTIVE SEED GUIDED AXILLARY SENTINEL LYMPH NODE Right 12/19/2016   Procedure: RADIOACTIVE SEED GUIDED AXILLARY SENTINEL LYMPH NODE BIOPSY;  Surgeon: Excell Seltzer, MD;  Location: [place];  Service: General;  Laterality: Right;   WISDOM TOOTH EXTRACTION  yrs ago    There were no vitals filed for this visit.   Subjective Assessment - 10/19/20 1111    Subjective Pt reports being sick the week before last and then forgot to call to reschedule.  Shoulder and arm  seem to be doing better. She has been doing the lymph drainage at home.  Arm has gotten stiff again.  Haven't kept up up with the stretches like she did.    Pertinent History R breast cancer, R lumpectomy in 01-04-17 with SLNB (3 and some were positive), then had ALNB (20 some were postive), pt completed chemo and radiation, then pt had recurrence in May 2021 and is stage IV, it has spead to lymph nodes and lung    Patient Stated Goals to decrease swelling in arm    Currently in Pain? Yes    Pain Score 2     Pain Location Axilla    Pain Orientation Right    Pain Descriptors / Indicators Aching    Pain Onset More than a month ago    Pain Frequency Intermittent              [facility] PT Assessment - 10/19/20 0001      AROM   Right Shoulder Extension 44 Degrees    Right Shoulder Flexion 130 Degrees    Right Shoulder ABduction 108 Degrees             LYMPHEDEMA/ONCOLOGY QUESTIONNAIRE - 10/19/20 0001      Right Upper Extremity Lymphedema   15 cm Proximal to Olecranon Process 39.4 cm    Olecranon Process 32.1 cm    15 cm Proximal to Ulnar Styloid Process 32.4 cm    Just Proximal to Ulnar Styloid Process 20 cm    Across Hand at [employer] 21.4 cm    At [place] of 2nd Digit 6.7 cm      Left Upper Extremity Lymphedema   15 cm Proximal to Olecranon Process 38 cm    Olecranon Process 32.5 cm    15 cm Proximal to Ulnar Styloid Process 28.2 cm    Just Proximal to Ulnar Styloid Process 18.5 cm    Across Hand at [employer] 20.5 cm    At [place] of 2nd Digit 6.6 cm                [facility]  Lymphedema Life Impact Scale Total Score 57.35 %            [facility] Adult PT Treatment/Exercise - 10/19/20 0001      Exercises   Exercises Shoulder    Other Exercises  ext with dowel   10 reps     Shoulder Exercises: Supine   Flexion AAROM;Both;10 reps    Theraband Level (Shoulder Flexion) --   and scaption x  10 supine     Manual Therapy   Edema Management Pt instructed in proper way to don and doff compression sleeve and gauntlet using a glove.  She was advised to wear in daytime only and to watch fingers for swelling.  Pt to call if she has questions.  PT Education - 10/19/20 1159    Education Details Pt brought in her jobst flat wear sleeve and gauntlet.  She was educated how to don and Engineer, water using gloves to assist. Garments fit well and were comfortable for her.  Advised to wear during the daytime only and remove for sleeping.  Pt to watch and make sure her hand is not swelling. Advised to call with questions    Person(s) Educated Patient    Methods Explanation;Demonstration    Comprehension Verbalized understanding;Returned demonstration               PT Long Term Goals - 10/19/20 1136      PT LONG TERM GOAL #1   Title Pt will obtain appropriate compression garments for long term management of lymphedmea.    Time 4    Period Weeks    Status Achieved   Just received and hasn't worn yet     PT LONG TERM GOAL #2   Title Pt will demonstrate 120 degrees of left shoulder flexion to allow her to reach overhead.    Baseline 95    Time 4    Period Weeks    Status Achieved      PT LONG TERM GOAL #3   Title Pt will demonstrate 90 degrees of right shoulder abduction to allow her to reach out to the side.    Baseline 62    Time 4    Period Weeks    Status Achieved      PT LONG TERM GOAL #4   Title Pt will be independent with a home exercise program for continued strengthening and stretching.    Time 4    Period Weeks    Status Partially Met      PT LONG TERM GOAL #5   Title Pt will report a 50% improvement in R axillary pain to allow improved comfort.    Time 4    Period Weeks    Status Achieved      Additional Long Term Goals   Additional Long Term Goals Yes      PT LONG TERM GOAL #6   Title Pt will have left shoulder flex 145 degrees for  improved reaching ability    Time 4    Period Weeks    Status New    Target Date 11/16/20      PT LONG TERM GOAL #7   Title Pt. is independent in self management of Lymphedema    Time 4    Period Weeks    Status New                 Plan - 10/19/20 1206    Clinical Impression Statement Pt has regressed with shoulder ROM over the last 2 weeks and she admits she  has been a little slack with exs. The ROM is till better than at initial evaluation and she achieved her initial ROM goals.  She has been performing MLD regularly. She brought her new compression sleeve and glove and was able to don it independently with PT instructions using gloves.  She will wera during the daytime only and remove before going to bed.  She was advised to call if she has any questions or concerns    Personal Factors and Comorbidities Fitness;Time since onset of injury/illness/exacerbation;Comorbidity 2    Comorbidities scoliosis, previous hx of breast cancer with radiation    Examination-Activity Limitations Reach Overhead;Carry;Lift    Stability/Clinical Decision Making Evolving/Moderate complexity  Clinical Decision Making Moderate    Rehab Potential Good    PT Frequency 2x / week    PT Duration 4 weeks    PT Treatment/Interventions ADLs/Self Care Home Management;Therapeutic exercise;Patient/family education;Manual techniques;Manual lymph drainage;Compression bandaging;Scar mobilization;Passive range of motion;Taping;Vasopneumatic Device    PT Next Visit Plan resume shoulder ROM, MLD, review sleeve wear and donning prn    Consulted and Agree with Plan of Care Patient           Patient will benefit from skilled therapeutic intervention in order to improve the following deficits and impairments:  Pain, Postural dysfunction, Impaired UE functional use, Increased fascial restricitons, Decreased strength, Decreased knowledge of use of DME, Decreased knowledge of precautions, Decreased range of motion,  Increased edema  Visit Diagnosis: Carcinoma of right breast metastatic to axillary lymph node [facility])     Problem List Patient Active Problem List   Diagnosis Date Noted   Stomatitis 08/10/2020   Varicose veins of bilateral lower extremities with other complications 15/04/6978   Dry skin dermatitis 07/27/2019   Appendicitis 11/28/2018   De Quervain's disease (tenosynovitis) 05/15/2018   Bilateral carpal tunnel syndrome 05/15/2018   Neuropathy due to chemotherapeutic drug ([place]) 02/11/2018   Peripheral neuropathy 08/13/2017   Osteoporosis 03/23/2017   Breast cancer metastasized to axillary lymph node ([place]) 06/21/2016   Malignant neoplasm of upper-outer quadrant of right breast in female, estrogen receptor positive ([place]) 06/21/2016   Elevated blood pressure reading without diagnosis of hypertension 04/28/2016   Herpes genitalis--since 2005 per patient    Environmental and seasonal allergies 07/28/2012   Fibromyalgia    Anxiety 02/07/2012   Depression (emotion) 01/04/2012   Headache-migranes 01/04/2012    Elsie Ra ,PT 10/19/2020, 12:14 PM  [place], [place], [address] Phone: [phone]   Fax:  [phone]  Name: MARCHETA HORSEY MRN: [id number] Date of Birth: [birth year]

## 2020-10-19 NOTE — Addendum Note (Signed)
Addended by: Claris Pong on: 10/19/2020 12:18 PM   Modules accepted: Orders

## 2020-10-21 ENCOUNTER — Other Ambulatory Visit: Payer: Self-pay

## 2020-10-21 ENCOUNTER — Ambulatory Visit: Payer: Medicaid Other

## 2020-10-21 DIAGNOSIS — C50911 Malignant neoplasm of unspecified site of right female breast: Secondary | ICD-10-CM

## 2020-10-21 DIAGNOSIS — M6281 Muscle weakness (generalized): Secondary | ICD-10-CM

## 2020-10-21 DIAGNOSIS — I890 Lymphedema, not elsewhere classified: Secondary | ICD-10-CM

## 2020-10-21 DIAGNOSIS — M25611 Stiffness of right shoulder, not elsewhere classified: Secondary | ICD-10-CM | POA: Diagnosis not present

## 2020-10-21 DIAGNOSIS — M79601 Pain in right arm: Secondary | ICD-10-CM

## 2020-10-21 DIAGNOSIS — C773 Secondary and unspecified malignant neoplasm of axilla and upper limb lymph nodes: Secondary | ICD-10-CM

## 2020-10-21 DIAGNOSIS — M62838 Other muscle spasm: Secondary | ICD-10-CM

## 2020-10-21 DIAGNOSIS — M25511 Pain in right shoulder: Secondary | ICD-10-CM

## 2020-10-21 DIAGNOSIS — R293 Abnormal posture: Secondary | ICD-10-CM

## 2020-10-21 NOTE — Telephone Encounter (Signed)
Please call patient--Oncology doc previously wrote this.  Need to know why she is needing.

## 2020-10-21 NOTE — Therapy (Signed)
[facility] [address] [place], [place], [address] Phone: [phone]   Fax:  [phone]  Physical Therapy Treatment  Patient Details  Name: April [name] MRN: [id number] Date of Birth: 18-Nov-1961 Referring Provider (PT): Causey   Encounter Date: 10/21/2020   PT End of Session - 10/21/20 0954    Visit Number 7    Number of Visits 17    Date for PT Re-Evaluation 11/16/20    Authorization Type Medicaid healthy blue    Authorization - Visit Number 7    Authorization - Number of Visits 27    PT Start Time 0912    PT Stop Time 0950    PT Time Calculation (min) 38 min    Activity Tolerance Patient tolerated treatment well    Behavior During Therapy [facility] for tasks assessed/performed           Past Medical History:  Diagnosis Date  . Allergy 07/28/2012   Seasonal/Environmental allergies  . Anxiety 2013   Since 2013  . Arthritis 2014 per patient    knees and shoulders  . Bilateral ankle fractures 07/2015   Booted and resolved   . Cancer [facility]) dx June 22, 2016   right breast  . Depression 2013   Multiple  episodes  in past.  . Elevated cholesterol 2017  . Fibromyalgia 2013   diagnosed by Dr. Corliss Skains  . Genital herpes 2005   Has outbreaks monthly if not on preventative medication  . GERD (gastroesophageal reflux disease) 2013  . History of radiation therapy 02/07/17- 03/21/17   Right Breast- 4 field 25 fractions. 50 Gy to SCLV/PAB in 25 fractions. Right Breast Boost 10 gy in 5 fractions.  . Migraine 2013   migraines  . Neuromuscular disorder (HCC) 03/20/2017   neuropathy in fingers and toes from Chemo--intermittent  . Obesity   . Osteoporosis 03/23/2017   noted per bone density scan  . Peripheral neuropathy 08/13/2017  . Personal history of chemotherapy 11/2016  . Personal history of radiation therapy    4/18  . Right wrist fracture 06/2015   Resolved  . Scoliosis of thoracic spine 01/04/2012  . Skin  condition 2012   patient reports periodic episodes of severe itching. She will itch and then blister at areas including her arms, back, and buttocks.   . Urinary, incontinence, stress female 07/14/2016   patient reported    Past Surgical History:  Procedure Laterality Date  . AXILLARY LYMPH NODE DISSECTION Right 12/26/2016   Procedure: RIGHT AXILLARY LYMPH NODE DISSECTION;  Surgeon: Glenna Fellows, MD;  Location: [facility];  Service: General;  Laterality: Right;  . BREAST LUMPECTOMY Right 2018  . BREAST LUMPECTOMY WITH NEEDLE LOCALIZATION Right 12/19/2016   Procedure: RIGHT BREAST NEEDLE LOCALIZED LUMPECTOMY, RIGHT RADIOACTIVE SEED TARGETED AXILLARY SENTINEL LYMPH NODE BIOPSY;  Surgeon: Glenna Fellows, MD;  Location: [facility];  Service: General;  Laterality: Right;  . IR GENERIC HISTORICAL  10/09/2016   IR CV LINE INJECTION 10/09/2016 Irish Lack, MD [facility]  . LAPAROSCOPIC APPENDECTOMY N/A 11/28/2018   Procedure: APPENDECTOMY LAPAROSCOPIC;  Surgeon: Berna Bue, MD;  [facility];  Service: General;  Laterality: N/A;  . PORT-A-CATH REMOVAL Left 12/19/2016   Procedure: REMOVAL PORT-A-CATH;  Surgeon: Glenna Fellows, MD;  Location:  SUR[facility]ERY CENTER;  Service: General;  Laterality: Left;  . PORTACATH PLACEMENT N/A 07/11/2016   Procedure: INSERTION PORT-A-CATH;  Surgeon: Glenna Fellows, MD;  Location: [facility];  Service: General;  Laterality:  N/A;  . RADIOACTIVE SEED GUIDED AXILLARY SENTINEL LYMPH NODE Right 12/19/2016   Procedure: RADIOACTIVE SEED GUIDED AXILLARY SENTINEL LYMPH NODE BIOPSY;  Surgeon: Excell Seltzer, MD;  Location: [place];  Service: General;  Laterality: Right;  . WISDOM TOOTH EXTRACTION  yrs ago    There were no vitals filed for this visit.   Subjective Assessment - 10/21/20 0912    Subjective Have Worn sleeve and gauntlet.  The sleeve felt good but pinched a little in the elbow crease, the  gauntlet bothered my thumb so I took them off after about 3 hours.  Have been exhausted after the last chemo pill I took.  Feel better today.  Have been doing the exercises at home consistently and I am a little sore.  My breast is swollen and always feels worse in the am when I first get up.    Pertinent History R breast cancer, R lumpectomy in 01-04-17 with SLNB (3 and some were positive), then had ALNB (20 some were postive), pt completed chemo and radiation, then pt had recurrence in May 2021 and is stage IV, it has spead to lymph nodes and lung    Currently in Pain? No/denies                       [facility]  Lymphedema Life Impact Scale Total Score 57.35 %            [facility] Adult PT Treatment/Exercise - 10/21/20 0001      Manual Therapy   Manual Therapy Soft tissue mobilization;Manual Lymphatic Drainage (MLD);Passive ROM    Soft tissue mobilization To right pectorals and axillary insertion and sternal portion, and in SL to serratus and lats prior to ROM    Manual Lymphatic Drainage (MLD) short neck, left axillary LN and right inguinal LN, anterior interaxillary anastomosis, right axillo-inguinal pathway, right upper breast and right UE starting proximally and working distally to hand, retracing all steps    Passive ROM PROM Right shoulder in all directions                  PT Education - 10/21/20 0952    Education Details Pt advised to wear sleeve and gauntlet for several hours at a time several times per day for a week.  Also suggested she try stretching thumb just slightly with a Marker or something similar to make more comfortable. If not improved may need to have it checked    Person(s) Educated Patient    Methods Explanation    Comprehension Verbalized understanding               PT Long Term Goals - 10/19/20 1136      PT LONG TERM GOAL #1   Title Pt will obtain appropriate  compression garments for long term management of lymphedmea.    Time 4    Period Weeks    Status Achieved   Just received and hasn't worn yet     PT LONG TERM GOAL #2   Title Pt will demonstrate 120 degrees of left shoulder flexion to allow her to reach overhead.    Baseline 95    Time 4    Period Weeks    Status Achieved      PT LONG TERM GOAL #3   Title Pt will demonstrate 90 degrees of right shoulder abduction to allow her to reach out to the side.  Baseline 62    Time 4    Period Weeks    Status Achieved      PT LONG TERM GOAL #4   Title Pt will be independent with a home exercise program for continued strengthening and stretching.    Time 4    Period Weeks    Status Partially Met      PT LONG TERM GOAL #5   Title Pt will report a 50% improvement in R axillary pain to allow improved comfort.    Time 4    Period Weeks    Status Achieved      Additional Long Term Goals   Additional Long Term Goals Yes      PT LONG TERM GOAL #6   Title Pt will have left shoulder flex 145 degrees for improved reaching ability    Time 4    Period Weeks    Status New    Target Date 11/16/20      PT LONG TERM GOAL #7   Title Pt. is independent in self management of Lymphedema    Time 4    Period Weeks    Status New                 Plan - 10/21/20 0955    Clinical Impression Statement Therapy consisted of STW , PROM and MLD to right upper breast and arm.  Pts ROM is again improving and she is being more compliant with HEP.  She experienced some arm numbness with PROM of abd.  She is having some discomfort with the thumb on her gauntlet and has been advised to wear several hours at a time, several times per day for a week and remove to see if her arm can accomodate to it.  Allso suggested she try and stretch thumb just slightly to make more comfortable    Personal Factors and Comorbidities Fitness;Time since onset of injury/illness/exacerbation;Comorbidity 2    Comorbidities  scoliosis, previous hx of breast cancer with radiation    Examination-Participation Restrictions Cleaning;Shop;Community Activity;Laundry    Stability/Clinical Decision Making Evolving/Moderate complexity    Clinical Decision Making Moderate    Rehab Potential Good    PT Frequency 2x / week    PT Duration 4 weeks    PT Treatment/Interventions ADLs/Self Care Home Management;Therapeutic exercise;Patient/family education;Manual techniques;Manual lymph drainage;Compression bandaging;Scar mobilization;Passive range of motion;Taping;Vasopneumatic Device    PT Next Visit Plan Cont STW, PROM, MLD, reassess sleeve/gauntlet    Consulted and Agree with Plan of Care Patient           Patient will benefit from skilled therapeutic intervention in order to improve the following deficits and impairments:  Pain, Postural dysfunction, Impaired UE functional use, Increased fascial restricitons, Decreased strength, Decreased knowledge of use of DME, Decreased knowledge of precautions, Decreased range of motion, Increased edema  Visit Diagnosis: 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 Stomatitis 08/10/2020  . Varicose veins of bilateral lower extremities with other complications 84/66/5993  . Dry skin dermatitis 07/27/2019  . Appendicitis 11/28/2018  . De Quervain's disease (tenosynovitis) 05/15/2018  . Bilateral carpal tunnel syndrome 05/15/2018  . Neuropathy due to chemotherapeutic drug ([place]) 02/11/2018  .  Peripheral neuropathy 08/13/2017  . Osteoporosis 03/23/2017  . Breast cancer metastasized to axillary lymph node ([place]) 06/21/2016  . Malignant neoplasm of upper-outer quadrant of  right breast in female, estrogen receptor positive ([place]) 06/21/2016  . Elevated blood pressure reading without diagnosis of hypertension 04/28/2016  . Herpes genitalis--since 2005 per patient   . Environmental and seasonal allergies 07/28/2012  . Fibromyalgia   . Anxiety 02/07/2012  . Depression (emotion) 01/04/2012  . Headache-migranes 01/04/2012    Claris Pong 10/21/2020, 9:59 AM  [place], [place], [address] Phone: [phone]   Fax:  [phone]  Name: April [name] MRN: [id number] Date of Birth: [birth year]  Cheral Almas, PT 10/21/20 10:00 AM

## 2020-10-26 ENCOUNTER — Ambulatory Visit: Payer: Medicaid Other

## 2020-10-26 ENCOUNTER — Other Ambulatory Visit: Payer: Self-pay

## 2020-10-26 DIAGNOSIS — R293 Abnormal posture: Secondary | ICD-10-CM

## 2020-10-26 DIAGNOSIS — I890 Lymphedema, not elsewhere classified: Secondary | ICD-10-CM

## 2020-10-26 DIAGNOSIS — M25611 Stiffness of right shoulder, not elsewhere classified: Secondary | ICD-10-CM | POA: Diagnosis not present

## 2020-10-26 DIAGNOSIS — M79601 Pain in right arm: Secondary | ICD-10-CM

## 2020-10-26 DIAGNOSIS — C50911 Malignant neoplasm of unspecified site of right female breast: Secondary | ICD-10-CM

## 2020-10-26 DIAGNOSIS — M6281 Muscle weakness (generalized): Secondary | ICD-10-CM

## 2020-10-26 DIAGNOSIS — M25511 Pain in right shoulder: Secondary | ICD-10-CM

## 2020-10-26 DIAGNOSIS — M62838 Other muscle spasm: Secondary | ICD-10-CM

## 2020-10-26 NOTE — Therapy (Signed)
[place], [place], [address] Phone: [phone]   Fax:  [phone]  Physical Therapy Treatment  Patient Details  Name: April [name] MRN: [id number] Date of Birth: 1961-11-17 Referring Provider (PT): Causey   Encounter Date: 10/26/2020   PT End of Session - 10/26/20 1205    Visit Number 8    Number of Visits 17    Date for PT Re-Evaluation 11/16/20    Authorization Type Medicaid healthy blue    Authorization - Visit Number 8    Authorization - Number of Visits 27    PT Start Time 1107    PT Stop Time 4665    PT Time Calculation (min) 46 min    Activity Tolerance Patient tolerated treatment well    Behavior During Therapy [facility] for tasks assessed/performed           Past Medical History:  Diagnosis Date   Allergy 07/28/2012   Seasonal/Environmental allergies   Anxiety 2013   Since 2013   Arthritis 2014 per patient    knees and shoulders   Bilateral ankle fractures 07/2015   Booted and resolved    Cancer [facility]) dx June 22, 2016   right breast   Depression 2013   Multiple  episodes  in past.   Elevated cholesterol 2017   Fibromyalgia 2013   diagnosed by Dr. [name]   Genital herpes 2005   Has outbreaks monthly if not on preventative medication   GERD (gastroesophageal reflux disease) 2013   History of radiation therapy 02/07/17- 03/21/17   Right Breast- 4 field 25 fractions. 50 Gy to SCLV/PAB in 25 fractions. Right Breast Boost 10 gy in 5 fractions.   Migraine 2013   migraines   Neuromuscular disorder ([place]) 03/20/2017   neuropathy in fingers and toes from Chemo--intermittent   Obesity    Osteoporosis 03/23/2017   noted per bone density scan   Peripheral neuropathy 08/13/2017   Personal history of chemotherapy 11/2016   Personal history of radiation therapy    4/18   Right wrist fracture 06/2015   Resolved   Scoliosis of thoracic spine 01/04/2012   Skin  condition 2012   patient reports periodic episodes of severe itching. She will itch and then blister at areas including her arms, back, and buttocks.    Urinary, incontinence, stress female 07/14/2016   patient reported    Past Surgical History:  Procedure Laterality Date   AXILLARY LYMPH NODE DISSECTION Right 12/26/2016   Procedure: RIGHT AXILLARY LYMPH NODE DISSECTION;  Surgeon: Excell Seltzer, MD;  Location: [place];  Service: General;  Laterality: Right;   BREAST LUMPECTOMY Right 2018   BREAST LUMPECTOMY WITH NEEDLE LOCALIZATION Right 12/19/2016   Procedure: RIGHT BREAST NEEDLE LOCALIZED LUMPECTOMY, RIGHT RADIOACTIVE SEED TARGETED AXILLARY SENTINEL LYMPH NODE BIOPSY;  Surgeon: Excell Seltzer, MD;  Location: [place];  Service: General;  Laterality: Right;   IR GENERIC HISTORICAL  10/09/2016   IR CV LINE INJECTION 10/09/2016 Aletta Edouard, MD [facility]   LAPAROSCOPIC APPENDECTOMY N/A 11/28/2018   Procedure: APPENDECTOMY LAPAROSCOPIC;  Surgeon: [name], MD;  Location: [place];  Service: General;  Laterality: N/A;   PORT-A-CATH REMOVAL Left 12/19/2016   Procedure: REMOVAL PORT-A-CATH;  Surgeon: Excell Seltzer, MD;  Location: [place];  Service: General;  Laterality: Left;   PORTACATH PLACEMENT N/A 07/11/2016   Procedure: INSERTION PORT-A-CATH;  Surgeon: Excell Seltzer, MD;  [facility]: [facility];  Service: General;  Laterality:  N/A;   RADIOACTIVE SEED GUIDED AXILLARY SENTINEL LYMPH NODE Right 12/19/2016   Procedure: RADIOACTIVE SEED GUIDED AXILLARY SENTINEL LYMPH NODE BIOPSY;  Surgeon: Excell Seltzer, MD;  Location: [place];  Service: General;  Laterality: Right;   WISDOM TOOTH EXTRACTION  yrs ago    There were no vitals filed for this visit.   Subjective Assessment - 10/26/20 1106    Subjective Arm is doing better with shoulder ROM and swelling is better.  Compliant with exs. Wear sleeve and  gauntlet but I only wear for about 4 hours.  Thumb still feels a little tight but its better.  Right breast is still swollen    Pertinent History R breast cancer, R lumpectomy in 01-04-17 with SLNB (3 and some were positive), then had ALNB (20 some were postive), pt completed chemo and radiation, then pt had recurrence in May 2021 and is stage IV, it has spead to lymph nodes and lung    Currently in Pain? No/denies    Pain Score 0-No pain                       Outpatient Rehab from 09/08/2020 in [place]  Lymphedema Life Impact Scale Total Score 57.35 %            [facility] Adult PT Treatment/Exercise - 10/26/20 0001      Manual Therapy   Manual Therapy Soft tissue mobilization;Manual Lymphatic Drainage (MLD);Passive ROM    Soft tissue mobilization To right pectorals and axillary insertion and sternal portion, and in SL to serratus and lats prior to ROM    Manual Lymphatic Drainage (MLD) short neck, left axillary LN and right inguinal LN, anterior interaxillary anastomosis, right axillo-inguinal pathway, right upper breast and right UE starting proximally and working distally to hand, retracing all steps    Passive ROM PROM Right shoulder in all directions                  PT Education - 10/26/20 1203    Education Details Reviewed MLD with pt.  She was using good technique.    Person(s) Educated Patient    Methods Demonstration;Explanation    Comprehension Verbalized understanding;Returned demonstration               PT Long Term Goals - 10/19/20 1136      PT LONG TERM GOAL #1   Title Pt will obtain appropriate compression garments for long term management of lymphedmea.    Time 4    Period Weeks    Status Achieved   Just received and hasn't worn yet     PT LONG TERM GOAL #2   Title Pt will demonstrate 120 degrees of left shoulder flexion to allow her to reach overhead.    Baseline 95    Time 4    Period Weeks     Status Achieved      PT LONG TERM GOAL #3   Title Pt will demonstrate 90 degrees of right shoulder abduction to allow her to reach out to the side.    Baseline 62    Time 4    Period Weeks    Status Achieved      PT LONG TERM GOAL #4   Title Pt will be independent with a home exercise program for continued strengthening and stretching.    Time 4    Period Weeks    Status Partially Met      PT  LONG TERM GOAL #5   Title Pt will report a 50% improvement in R axillary pain to allow improved comfort.    Time 4    Period Weeks    Status Achieved      Additional Long Term Goals   Additional Long Term Goals Yes      PT LONG TERM GOAL #6   Title Pt will have left shoulder flex 145 degrees for improved reaching ability    Time 4    Period Weeks    Status New    Target Date 11/16/20      PT LONG TERM GOAL #7   Title Pt. is independent in self management of Lymphedema    Time 4    Period Weeks    Status New                 Plan - 10/26/20 1208    Clinical Impression Statement Pt continues to improve with shoulder ROM but continues to have intermittent swelling in the right breast.  she is more compliant with HEP.  She was able to return demonstrate good technique with MLD, but needed review of arm sequence    Personal Factors and Comorbidities Fitness;Time since onset of injury/illness/exacerbation;Comorbidity 2    Comorbidities scoliosis, previous hx of breast cancer with radiation    Examination-Activity Limitations Reach Overhead;Carry;Lift    Stability/Clinical Decision Making Evolving/Moderate complexity    Clinical Decision Making Moderate    Rehab Potential Good    PT Frequency 2x / week    PT Duration 4 weeks    PT Treatment/Interventions ADLs/Self Care Home Management;Therapeutic exercise;Patient/family education;Manual techniques;Manual lymph drainage;Compression bandaging;Scar mobilization;Passive range of motion;Taping;Vasopneumatic Device    PT Next Visit  Plan Cont STW, PROM, MLD breast and arm, reassess sleeve/gauntlet    Consulted and Agree with Plan of Care Patient           Patient will benefit from skilled therapeutic intervention in order to improve the following deficits and impairments:  Pain, Postural dysfunction, Impaired UE functional use, Increased fascial restricitons, Decreased strength, Decreased knowledge of use of DME, Decreased knowledge of precautions, Decreased range of motion, Increased edema  Visit Diagnosis: 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)  Stiffness of right shoulder, not elsewhere classified  Pain in right arm  Acute pain of right shoulder  Abnormal posture  Lymphedema, not elsewhere classified  Muscle weakness (generalized)  Other muscle spasm     Problem List Patient Active Problem List   Diagnosis Date Noted   Stomatitis 08/10/2020   Varicose veins of bilateral lower extremities with other complications 27/78/2423   Dry skin dermatitis 07/27/2019   Appendicitis 11/28/2018   De Quervain's disease (tenosynovitis) 05/15/2018   Bilateral carpal tunnel syndrome 05/15/2018   Neuropathy due to chemotherapeutic drug ([address]) 02/11/2018   Peripheral neuropathy 08/13/2017   Osteoporosis 03/23/2017   Breast cancer metastasized to axillary lymph node ([place]) 06/21/2016   Malignant neoplasm of upper-outer quadrant of right breast in female, estrogen receptor positive ([place]) 06/21/2016   Elevated blood pressure reading without diagnosis of hypertension 04/28/2016   Herpes genitalis--since 2005 per patient    Environmental and seasonal allergies 07/28/2012   Fibromyalgia    Anxiety 02/07/2012   Depression (emotion) 01/04/2012   Headache-migranes 01/04/2012    Claris Pong 10/26/2020, 12:11 PM  [place], [place], [address] Phone: [phone]   Fax:  [phone]  Name:  April [name]  MRN: [id number] Date of Birth: Mar 14, 1961  Cheral Almas, PT 10/26/20 12:13 PM

## 2020-10-27 ENCOUNTER — Other Ambulatory Visit: Payer: Self-pay | Admitting: *Deleted

## 2020-11-01 ENCOUNTER — Inpatient Hospital Stay: Payer: Medicaid Other | Attending: Oncology

## 2020-11-01 ENCOUNTER — Inpatient Hospital Stay (HOSPITAL_BASED_OUTPATIENT_CLINIC_OR_DEPARTMENT_OTHER): Payer: Medicaid Other | Admitting: Adult Health

## 2020-11-01 ENCOUNTER — Telehealth: Payer: Self-pay

## 2020-11-01 ENCOUNTER — Other Ambulatory Visit: Payer: Self-pay

## 2020-11-01 ENCOUNTER — Encounter: Payer: Self-pay | Admitting: Adult Health

## 2020-11-01 ENCOUNTER — Inpatient Hospital Stay: Payer: Medicaid Other

## 2020-11-01 ENCOUNTER — Other Ambulatory Visit: Payer: Self-pay | Admitting: *Deleted

## 2020-11-01 VITALS — BP 103/77 | HR 88 | Temp 99.5°F | Resp 17 | Ht 64.0 in | Wt 213.7 lb

## 2020-11-01 DIAGNOSIS — Z79818 Long term (current) use of other agents affecting estrogen receptors and estrogen levels: Secondary | ICD-10-CM | POA: Insufficient documentation

## 2020-11-01 DIAGNOSIS — C773 Secondary and unspecified malignant neoplasm of axilla and upper limb lymph nodes: Secondary | ICD-10-CM

## 2020-11-01 DIAGNOSIS — Z170 Estrogen receptor positive status [ER+]: Secondary | ICD-10-CM | POA: Insufficient documentation

## 2020-11-01 DIAGNOSIS — M810 Age-related osteoporosis without current pathological fracture: Secondary | ICD-10-CM

## 2020-11-01 DIAGNOSIS — C50411 Malignant neoplasm of upper-outer quadrant of right female breast: Secondary | ICD-10-CM | POA: Diagnosis not present

## 2020-11-01 DIAGNOSIS — C50919 Malignant neoplasm of unspecified site of unspecified female breast: Secondary | ICD-10-CM

## 2020-11-01 DIAGNOSIS — C50911 Malignant neoplasm of unspecified site of right female breast: Secondary | ICD-10-CM

## 2020-11-01 DIAGNOSIS — Z79899 Other long term (current) drug therapy: Secondary | ICD-10-CM | POA: Insufficient documentation

## 2020-11-01 DIAGNOSIS — M546 Pain in thoracic spine: Secondary | ICD-10-CM

## 2020-11-01 DIAGNOSIS — Z87891 Personal history of nicotine dependence: Secondary | ICD-10-CM | POA: Diagnosis not present

## 2020-11-01 LAB — COMPREHENSIVE METABOLIC PANEL
ALT: 9 U/L (ref 0–44)
AST: 13 U/L — ABNORMAL LOW (ref 15–41)
Albumin: 3.6 g/dL (ref 3.5–5.0)
Alkaline Phosphatase: 75 U/L (ref 38–126)
Anion gap: 8 (ref 5–15)
BUN: 11 mg/dL (ref 6–20)
CO2: 25 mmol/L (ref 22–32)
Calcium: 9.2 mg/dL (ref 8.9–10.3)
Chloride: 108 mmol/L (ref 98–111)
Creatinine, Ser: 0.95 mg/dL (ref 0.44–1.00)
GFR, Estimated: >60 mL/min (ref >60)
Glucose, Bld: 93 mg/dL (ref 70–99)
Potassium: 3.7 mmol/L (ref 3.5–5.1)
Sodium: 141 mmol/L (ref 135–145)
Total Bilirubin: 0.4 mg/dL (ref 0.3–1.2)
Total Protein: 7.1 g/dL (ref 6.5–8.1)

## 2020-11-01 LAB — CBC WITH DIFFERENTIAL/PLATELET
Abs Immature Granulocytes: 0.01 10*3/uL (ref 0.00–0.07)
Basophils Absolute: 0 10*3/uL (ref 0.0–0.1)
Basophils Relative: 1 %
Eosinophils Absolute: 0.1 10*3/uL (ref 0.0–0.5)
Eosinophils Relative: 2 %
HCT: 37.7 % (ref 36.0–46.0)
Hemoglobin: 13.1 g/dL (ref 12.0–15.0)
Immature Granulocytes: 0 %
Lymphocytes Relative: 21 %
Lymphs Abs: 0.7 10*3/uL (ref 0.7–4.0)
MCH: 36.7 pg — ABNORMAL HIGH (ref 26.0–34.0)
MCHC: 34.7 g/dL (ref 30.0–36.0)
MCV: 105.6 fL — ABNORMAL HIGH (ref 80.0–100.0)
Monocytes Absolute: 0.2 10*3/uL (ref 0.1–1.0)
Monocytes Relative: 6 %
Neutro Abs: 2.4 10*3/uL (ref 1.7–7.7)
Neutrophils Relative %: 70 %
Platelets: 311 10*3/uL (ref 150–400)
RBC: 3.57 MIL/uL — ABNORMAL LOW (ref 3.87–5.11)
RDW: 13.2 % (ref 11.5–15.5)
WBC: 3.4 10*3/uL — ABNORMAL LOW (ref 4.0–10.5)
nRBC: 0 % (ref 0.0–0.2)

## 2020-11-01 MED ORDER — FULVESTRANT 250 MG/5ML IM SOLN
INTRAMUSCULAR | Status: AC
  Filled 2020-11-01: qty 10

## 2020-11-01 MED ORDER — FULVESTRANT 250 MG/5ML IM SOLN
500.0000 mg | INTRAMUSCULAR | Status: DC
  Administered 2020-11-01: 500 mg via INTRAMUSCULAR

## 2020-11-01 MED ORDER — PALBOCICLIB 100 MG PO CAPS: 100.0000 mg | ORAL_CAPSULE | Freq: Every day | ORAL | 3 refills | Status: DC
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
## 2020-11-01 NOTE — Patient Instructions (Signed)
Fulvestrant injection What is this medicine? FULVESTRANT (ful VES trant) blocks the effects of estrogen. It is used to treat breast cancer. This medicine may be used for other purposes; ask your health care provider or pharmacist if you have questions. COMMON BRAND NAME(S): FASLODEX What should I tell my health care provider before I take this medicine? They need to know if you have any of these conditions:  bleeding disorders  liver disease  low blood counts, like low white cell, platelet, or red cell counts  an unusual or allergic reaction to fulvestrant, other medicines, foods, dyes, or preservatives  pregnant or trying to get pregnant  breast-feeding How should I use this medicine? This medicine is for injection into a muscle. It is usually given by a health care professional in a hospital or clinic setting. Talk to your pediatrician regarding the use of this medicine in children. Special care may be needed. Overdosage: If you think you have taken too much of this medicine contact a poison control center or emergency room at once. NOTE: This medicine is only for you. Do not share this medicine with others. What if I miss a dose? It is important not to miss your dose. Call your doctor or health care professional if you are unable to keep an appointment. What may interact with this medicine?  medicines that treat or prevent blood clots like warfarin, enoxaparin, dalteparin, apixaban, dabigatran, and rivaroxaban This list may not describe all possible interactions. Give your health care provider a list of all the medicines, herbs, non-prescription drugs, or dietary supplements you use. Also tell them if you smoke, drink alcohol, or use illegal drugs. Some items may interact with your medicine. What should I watch for while using this medicine? Your condition will be monitored carefully while you are receiving this medicine. You will need important blood work done while you are taking  this medicine. Do not become pregnant while taking this medicine or for at least 1 year after stopping it. Women of child-bearing potential will need to have a negative pregnancy test before starting this medicine. Women should inform their doctor if they wish to become pregnant or think they might be pregnant. There is a potential for serious side effects to an unborn child. Men should inform their doctors if they wish to father a child. This medicine may lower sperm counts. Talk to your health care professional or pharmacist for more information. Do not breast-feed an infant while taking this medicine or for 1 year after the last dose. What side effects may I notice from receiving this medicine? Side effects that you should report to your doctor or health care professional as soon as possible:  allergic reactions like skin rash, itching or hives, swelling of the face, lips, or tongue  feeling faint or lightheaded, falls  pain, tingling, numbness, or weakness in the legs  signs and symptoms of infection like fever or chills; cough; flu-like symptoms; sore throat  vaginal bleeding Side effects that usually do not require medical attention (report to your doctor or health care professional if they continue or are bothersome):  aches, pains  constipation  diarrhea  headache  hot flashes  nausea, vomiting  pain at site where injected  stomach pain This list may not describe all possible side effects. Call your doctor for medical advice about side effects. You may report side effects to FDA at 1-800-FDA-1088. Where should I keep my medicine? This drug is given in a hospital or clinic and will   not be stored at home. NOTE: This sheet is a summary. It may not cover all possible information. If you have questions about this medicine, talk to your doctor, pharmacist, or health care provider.  2020 Elsevier/Gold Standard (2018-02-28 11:34:41)  

## 2020-11-02 ENCOUNTER — Telehealth: Payer: Self-pay | Admitting: Adult Health

## 2020-11-02 ENCOUNTER — Encounter: Payer: Self-pay | Admitting: Physical Therapy

## 2020-11-02 ENCOUNTER — Ambulatory Visit: Payer: Medicaid Other | Admitting: Physical Therapy

## 2020-11-02 DIAGNOSIS — M25611 Stiffness of right shoulder, not elsewhere classified: Secondary | ICD-10-CM | POA: Diagnosis not present

## 2020-11-02 DIAGNOSIS — I890 Lymphedema, not elsewhere classified: Secondary | ICD-10-CM

## 2020-11-02 DIAGNOSIS — M79601 Pain in right arm: Secondary | ICD-10-CM

## 2020-11-02 LAB — CANCER ANTIGEN 27.29: CA 27.29: 120.9 U/mL — ABNORMAL HIGH (ref 0.0–38.6)
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
## 2020-11-02 NOTE — Telephone Encounter (Signed)
No 11/29 los. No changes made to pt's schedule.

## 2020-11-03 ENCOUNTER — Ambulatory Visit: Payer: Medicaid Other | Attending: Adult Health | Admitting: Physical Therapy

## 2020-11-03 ENCOUNTER — Encounter: Payer: Self-pay | Admitting: Physical Therapy

## 2020-11-03 ENCOUNTER — Other Ambulatory Visit: Payer: Self-pay

## 2020-11-03 ENCOUNTER — Telehealth: Payer: Self-pay

## 2020-11-03 DIAGNOSIS — M6281 Muscle weakness (generalized): Secondary | ICD-10-CM | POA: Diagnosis present

## 2020-11-03 DIAGNOSIS — M62838 Other muscle spasm: Secondary | ICD-10-CM | POA: Diagnosis present

## 2020-11-03 DIAGNOSIS — M25611 Stiffness of right shoulder, not elsewhere classified: Secondary | ICD-10-CM | POA: Diagnosis present

## 2020-11-03 DIAGNOSIS — I890 Lymphedema, not elsewhere classified: Secondary | ICD-10-CM | POA: Insufficient documentation

## 2020-11-03 DIAGNOSIS — C50911 Malignant neoplasm of unspecified site of right female breast: Secondary | ICD-10-CM | POA: Diagnosis present

## 2020-11-03 DIAGNOSIS — R293 Abnormal posture: Secondary | ICD-10-CM | POA: Diagnosis present

## 2020-11-03 DIAGNOSIS — M25511 Pain in right shoulder: Secondary | ICD-10-CM | POA: Diagnosis present

## 2020-11-03 DIAGNOSIS — C773 Secondary and unspecified malignant neoplasm of axilla and upper limb lymph nodes: Secondary | ICD-10-CM | POA: Diagnosis present

## 2020-11-03 DIAGNOSIS — M79601 Pain in right arm: Secondary | ICD-10-CM | POA: Diagnosis present

## 2020-11-03 NOTE — Telephone Encounter (Signed)
Pt called LVM stating she was having muscle spasms in back. This LPN called pt and pt states she is fine now, and has not had any further spasms but has been having persistent back pain. This LPN advised pt to use heating pad for the time being until MRI. Pt verbalized understanding and states she bought a [employer] as well. Pt knows to call if sx worsen.
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
## 2020-11-04 ENCOUNTER — Ambulatory Visit: Payer: Medicaid Other

## 2020-11-04 ENCOUNTER — Encounter: Payer: Medicaid Other | Admitting: Physical Therapy

## 2020-11-04 ENCOUNTER — Other Ambulatory Visit: Payer: Medicaid Other

## 2020-11-04 NOTE — Telephone Encounter (Signed)
Pt. States Dr.Mulberry prescribed this medication on 01/30/2020 for Anxiety and depression problems . She is having those problems again due to the terminal cancer and would like to start taking it again.

## 2020-11-08 NOTE — Telephone Encounter (Signed)
Discussed with Dr. Amil Amen and patient needs to be seen at the office to evaluate. OV scheduled for 11/12/20 at 9:30am. Patient informed

## 2020-11-09 ENCOUNTER — Ambulatory Visit: Payer: Medicaid Other | Admitting: Physical Therapy

## 2020-11-09 ENCOUNTER — Other Ambulatory Visit: Payer: Self-pay

## 2020-11-09 ENCOUNTER — Encounter: Payer: Self-pay | Admitting: Physical Therapy

## 2020-11-09 DIAGNOSIS — M25611 Stiffness of right shoulder, not elsewhere classified: Secondary | ICD-10-CM

## 2020-11-09 DIAGNOSIS — I890 Lymphedema, not elsewhere classified: Secondary | ICD-10-CM

## 2020-11-09 DIAGNOSIS — R293 Abnormal posture: Secondary | ICD-10-CM

## 2020-11-09 NOTE — Patient Instructions (Signed)
Over Head Pull: Narrow and Wide Grip   Cancer Rehab [phone] ° ° °On back, knees bent, feet flat, band across thighs, elbows straight but relaxed. Pull hands apart (start). Keeping elbows straight, bring arms up and over head, hands toward floor. Keep pull steady on band. Hold momentarily. Return slowly, keeping pull steady, back to start. Then do same with a wider grip on the band (past shoulder width) °Repeat _10__ times. Band color __yellow____  ° °Side Pull: Double Arm ° ° °On back, knees bent, feet flat. Arms perpendicular to body, shoulder level, elbows straight but relaxed. Pull arms out to sides, elbows straight. Resistance band comes across collarbones, hands toward floor. Hold momentarily. Slowly return to starting position. Repeat _10__ times. Band color _yellow____  ° °Sword ° ° °On back, knees bent, feet flat, left hand on left hip, right hand above left. Pull right arm DIAGONALLY (hip to shoulder) across chest. Bring right arm along head toward floor. Thumb down when by hip and rotates upwards when by head. Hold momentarily. Slowly return to starting position. °Repeat _10__ times. Do with left arm. Band color _yellow_____  ° °Shoulder Rotation: Double Arm ° ° °On back, knees bent, feet flat, elbows tucked at sides, bent 90°, hands palms up. Pull hands apart and down toward floor, keeping elbows near sides. Hold momentarily. Slowly return to starting position. °Repeat _10__ times. Band color __yellow____  ° ° °

## 2020-11-09 NOTE — Telephone Encounter (Signed)
Oral Oncology Patient Advocate Encounter  Met patient in [place] to complete re-enrollment application for [facility] in an effort to reduce patient's out of pocket expense for [facility] to $0.    Application completed and faxed to [phone].   [employer] patient assistance phone number for follow up is [phone].   This encounter will be updated until final determination.   [place] Patient [place] Phone [phone] Fax [phone] 11/09/2020 3:40 PM

## 2020-11-09 NOTE — Therapy (Signed)
April [name], [place], [address] Phone: [phone]   Fax:  [phone]  Physical Therapy Treatment  Patient Details  Name: April [name] MRN: [id number] Date of Birth: Oct 12, 1961 Referring Provider (PT): Causey   Encounter Date: 11/09/2020   PT End of Session - 11/09/20 1402    Visit Number 11    Number of Visits 17    Date for PT Re-Evaluation 11/16/20    Authorization Type Medicaid healthy blue    Authorization - Visit Number 11    Authorization - Number of Visits 27    PT Start Time 2446    PT Stop Time 1400    PT Time Calculation (min) 53 min    Activity Tolerance Patient tolerated treatment well    Behavior During Therapy [facility] for tasks assessed/performed           Past Medical History:  Diagnosis Date  . Allergy 07/28/2012   Seasonal/Environmental allergies  . Anxiety 2013   Since 2013  . Arthritis 2014 per patient    knees and shoulders  . Bilateral ankle fractures 07/2015   Booted and resolved   . Cancer [facility]) dx June 22, 2016   right breast  . Depression 2013   Multiple  episodes  in past.  . Elevated cholesterol 2017  . Fibromyalgia 2013   diagnosed by Dr. [name]  . Genital herpes 2005   Has outbreaks monthly if not on preventative medication  . GERD (gastroesophageal reflux disease) 2013  . History of radiation therapy 02/07/17- 03/21/17   Right Breast- 4 field 25 fractions. 50 Gy to SCLV/PAB in 25 fractions. Right Breast Boost 10 gy in 5 fractions.  . Migraine 2013   migraines  . Neuromuscular disorder ([place]) 03/20/2017   neuropathy in fingers and toes from Chemo--intermittent  . Obesity   . Osteoporosis 03/23/2017   noted per bone density scan  . Peripheral neuropathy 08/13/2017  . Personal history of chemotherapy 11/2016  . Personal history of radiation therapy    4/18  . Right wrist fracture 06/2015   Resolved  . Scoliosis of thoracic spine 01/04/2012  . Skin  condition 2012   patient reports periodic episodes of severe itching. She will itch and then blister at areas including her arms, back, and buttocks.   . Urinary, incontinence, stress female 07/14/2016   patient reported    Past Surgical History:  Procedure Laterality Date  . AXILLARY LYMPH NODE DISSECTION Right 12/26/2016   Procedure: RIGHT AXILLARY LYMPH NODE DISSECTION;  Surgeon: Excell Seltzer, MD;  Location: [place];  Service: General;  Laterality: Right;  . BREAST LUMPECTOMY Right 2018  . BREAST LUMPECTOMY WITH NEEDLE LOCALIZATION Right 12/19/2016   Procedure: RIGHT BREAST NEEDLE LOCALIZED LUMPECTOMY, RIGHT RADIOACTIVE SEED TARGETED AXILLARY SENTINEL LYMPH NODE BIOPSY;  Surgeon: Excell Seltzer, MD;  Location: [place];  Service: General;  Laterality: Right;  . IR GENERIC HISTORICAL  10/09/2016   IR CV LINE INJECTION 10/09/2016 Aletta Edouard, MD [facility]  . LAPAROSCOPIC APPENDECTOMY N/A 11/28/2018   Procedure: APPENDECTOMY LAPAROSCOPIC;  Surgeon: [name], MD;  Location: [place];  Service: General;  Laterality: N/A;  . PORT-A-CATH REMOVAL Left 12/19/2016   Procedure: REMOVAL PORT-A-CATH;  Surgeon: Excell Seltzer, MD;  Location: [place];  Service: General;  Laterality: Left;  . PORTACATH PLACEMENT N/A 07/11/2016   Procedure: INSERTION PORT-A-CATH;  Surgeon: Excell Seltzer, MD;  [facility];  Service: General;  Laterality:  N/A;  . RADIOACTIVE SEED GUIDED AXILLARY SENTINEL LYMPH NODE Right 12/19/2016   Procedure: RADIOACTIVE SEED GUIDED AXILLARY SENTINEL LYMPH NODE BIOPSY;  Surgeon: Excell Seltzer, MD;  Location: [place];  Service: General;  Laterality: Right;  . WISDOM TOOTH EXTRACTION  yrs ago    There were no vitals filed for this visit.   Subjective Assessment - 11/09/20 1308    Subjective My ROM Is doing good. I am still a little stiff.    Pertinent History R breast cancer, R  lumpectomy in 01-04-17 with SLNB (3 and some were positive), then had ALNB (20 some were postive), pt completed chemo and radiation, then pt had recurrence in May 2021 and is stage IV, it has spead to lymph nodes and lung    Patient Stated Goals to decrease swelling in arm    Currently in Pain? Yes    Pain Score 3     Pain Location Axilla    Pain Orientation Right    Pain Descriptors / Indicators --   stiff   Pain Type Acute pain                       Outpatient [facility]  Lymphedema Life Impact Scale Total Score 57.35 %            [facility] Adult PT Treatment/Exercise - 11/09/20 0001      Shoulder Exercises: Supine   Horizontal ABduction Strengthening;Both;10 reps;Theraband   pt returned PT demo   Theraband Level (Shoulder Horizontal ABduction) Level 1 (Yellow)    External Rotation Strengthening;Both;10 reps;Theraband   pt returned therapist demo   Theraband Level (Shoulder External Rotation) Level 1 (Yellow)    Flexion Strengthening;Both;10 reps;Theraband   narrow and wide grip, pt returned therapist demo   Theraband Level (Shoulder Flexion) Level 1 (Yellow)    Diagonals Strengthening;Both;10 reps;Theraband   pt returned therapist demo   Theraband Level (Shoulder Diagonals) Level 1 (Yellow)      Manual Therapy   Manual Lymphatic Drainage (MLD) short neck, left axillary LN and right inguinal LN, anterior interaxillary anastomosis, right axillo-inguinal pathway, right UE starting proximally and working distally to hand, retracing all steps    Passive ROM PROM Right shoulder in all directions- v/c to engage scapular muscles to decrease pinching pain                       PT Long Term Goals - 10/19/20 1136      PT LONG TERM GOAL #1   Title Pt will obtain appropriate compression garments for long term management of lymphedmea.    Time 4    Period Weeks    Status Achieved   Just received and hasn't  worn yet     PT LONG TERM GOAL #2   Title Pt will demonstrate 120 degrees of left shoulder flexion to allow her to reach overhead.    Baseline 95    Time 4    Period Weeks    Status Achieved      PT LONG TERM GOAL #3   Title Pt will demonstrate 90 degrees of right shoulder abduction to allow her to reach out to the side.    Baseline 62    Time 4    Period Weeks    Status Achieved      PT LONG TERM GOAL #4   Title Pt will be independent with a home exercise  program for continued strengthening and stretching.    Time 4    Period Weeks    Status Partially Met      PT LONG TERM GOAL #5   Title Pt will report a 50% improvement in R axillary pain to allow improved comfort.    Time 4    Period Weeks    Status Achieved      Additional Long Term Goals   Additional Long Term Goals Yes      PT LONG TERM GOAL #6   Title Pt will have left shoulder flex 145 degrees for improved reaching ability    Time 4    Period Weeks    Status New    Target Date 11/16/20      PT LONG TERM GOAL #7   Title Pt. is independent in self management of Lymphedema    Time 4    Period Weeks    Status New                 Plan - 11/09/20 1404    Clinical Impression Statement Pt still having pain in her right shoulder with PROM. Educated pt today to engage scapular muscles throughout to decrease pain. When pt retracted and depressed her shoulders throughout she no longer had pain. Instructed pt in supine scapular strengthening exercises and issued them as part of her HEP.    PT Frequency 2x / week    PT Duration 4 weeks    PT Treatment/Interventions ADLs/Self Care Home Management;Therapeutic exercise;Patient/family education;Manual techniques;Manual lymph drainage;Compression bandaging;Scar mobilization;Passive range of motion;Taping;Vasopneumatic Device    PT Next Visit Plan assess indep with supine scap, Cont STW, PROM, MLD, reassess sleeve/gauntlet    Consulted and Agree with Plan of Care  Patient           Patient will benefit from skilled therapeutic intervention in order to improve the following deficits and impairments:  Pain, Postural dysfunction, Impaired UE functional use, Increased fascial restricitons, Decreased strength, Decreased knowledge of use of DME, Decreased knowledge of precautions, Decreased range of motion, Increased edema  Visit Diagnosis: Stiffness of right shoulder, not elsewhere classified  Lymphedema, not elsewhere classified  Abnormal posture     Problem List Patient Active Problem List   Diagnosis Date Noted  . Stomatitis 08/10/2020  . Varicose veins of bilateral lower extremities with other complications 01/75/1025  . Dry skin dermatitis 07/27/2019  . Appendicitis 11/28/2018  . De Quervain's disease (tenosynovitis) 05/15/2018  . Bilateral carpal tunnel syndrome 05/15/2018  . Neuropathy due to chemotherapeutic drug ([place]) 02/11/2018  . Peripheral neuropathy 08/13/2017  . Osteoporosis 03/23/2017  . Breast cancer metastasized to axillary lymph node ([place]) 06/21/2016  . Malignant neoplasm of upper-outer quadrant of right breast in female, estrogen receptor positive ([place]) 06/21/2016  . Elevated blood pressure reading without diagnosis of hypertension 04/28/2016  . Herpes genitalis--since 2005 per patient   . Environmental and seasonal allergies 07/28/2012  . Fibromyalgia   . Anxiety 02/07/2012  . Depression (emotion) 01/04/2012  . Headache-migranes 01/04/2012    Manus Gunning 11/09/2020, 2:10 PM  [place], [place], [address] Phone: [phone]   Fax:  [phone]  Name: DENAJA VERHOEVEN MRN: [id number] Date of Birth: Sep 27, 1961  Manus Gunning, PT 11/09/20 2:10 PM

## 2020-11-10 ENCOUNTER — Other Ambulatory Visit: Payer: Medicaid Other

## 2020-11-10 ENCOUNTER — Ambulatory Visit: Payer: Medicaid Other | Admitting: Adult Health

## 2020-11-10 ENCOUNTER — Ambulatory Visit (HOSPITAL_COMMUNITY): Payer: Medicaid Other

## 2020-11-10 ENCOUNTER — Ambulatory Visit: Payer: Medicaid Other

## 2020-11-11 ENCOUNTER — Encounter: Payer: Self-pay | Admitting: Physical Therapy

## 2020-11-11 ENCOUNTER — Other Ambulatory Visit: Payer: Self-pay

## 2020-11-11 ENCOUNTER — Ambulatory Visit: Payer: Medicaid Other | Admitting: Physical Therapy

## 2020-11-11 DIAGNOSIS — I890 Lymphedema, not elsewhere classified: Secondary | ICD-10-CM

## 2020-11-11 DIAGNOSIS — M25611 Stiffness of right shoulder, not elsewhere classified: Secondary | ICD-10-CM

## 2020-11-11 NOTE — Therapy (Signed)
[place], [place], [address] Phone: [phone]   Fax:  [phone]  Physical Therapy Treatment  Patient Details  Name: April [name] MRN: [id number] Date of Birth: [birth year] Referring Provider (PT): Causey   Encounter Date: 11/11/2020   PT End of Session - 11/11/20 1407    Visit Number 12    Number of Visits 17    Date for PT Re-Evaluation 11/16/20    Authorization Type Medicaid healthy blue    PT Start Time 2707   pt arrived late   PT Stop Time 1400    PT Time Calculation (min) 54 min    Activity Tolerance Patient tolerated treatment well    Behavior During Therapy [facility] for tasks assessed/performed           Past Medical History:  Diagnosis Date  . Allergy 07/28/2012   Seasonal/Environmental allergies  . Anxiety 2013   Since 2013  . Arthritis 2014 per patient    knees and shoulders  . Bilateral ankle fractures 07/2015   Booted and resolved   . Cancer [facility]) dx June 22, 2016   right breast  . Depression 2013   Multiple  episodes  in past.  . Elevated cholesterol 2017  . Fibromyalgia 2013   diagnosed by Dr. [name]  . Genital herpes 2005   Has outbreaks monthly if not on preventative medication  . GERD (gastroesophageal reflux disease) 2013  . History of radiation therapy 02/07/17- 03/21/17   Right Breast- 4 field 25 fractions. 50 Gy to SCLV/PAB in 25 fractions. Right Breast Boost 10 gy in 5 fractions.  . Migraine 2013   migraines  . Neuromuscular disorder ([place]) 03/20/2017   neuropathy in fingers and toes from Chemo--intermittent  . Obesity   . Osteoporosis 03/23/2017   noted per bone density scan  . Peripheral neuropathy 08/13/2017  . Personal history of chemotherapy 11/2016  . Personal history of radiation therapy    4/18  . Right wrist fracture 06/2015   Resolved  . Scoliosis of thoracic spine 01/04/2012  . Skin condition 2012   patient reports periodic episodes of severe  itching. She will itch and then blister at areas including her arms, back, and buttocks.   . Urinary, incontinence, stress female 07/14/2016   patient reported    Past Surgical History:  Procedure Laterality Date  . AXILLARY LYMPH NODE DISSECTION Right 12/26/2016   Procedure: RIGHT AXILLARY LYMPH NODE DISSECTION;  Surgeon: Excell Seltzer, MD;  Location: [place];  Service: General;  Laterality: Right;  . BREAST LUMPECTOMY Right 2018  . BREAST LUMPECTOMY WITH NEEDLE LOCALIZATION Right 12/19/2016   Procedure: RIGHT BREAST NEEDLE LOCALIZED LUMPECTOMY, RIGHT RADIOACTIVE SEED TARGETED AXILLARY SENTINEL LYMPH NODE BIOPSY;  Surgeon: Excell Seltzer, MD;  Location: [place];  Service: General;  Laterality: Right;  . IR GENERIC HISTORICAL  10/09/2016   IR CV LINE INJECTION 10/09/2016 Aletta Edouard, MD [facility]  . LAPAROSCOPIC APPENDECTOMY N/A 11/28/2018   Procedure: APPENDECTOMY LAPAROSCOPIC;  Surgeon: [name], MD;  Location: [place];  Service: General;  Laterality: N/A;  . PORT-A-CATH REMOVAL Left 12/19/2016   Procedure: REMOVAL PORT-A-CATH;  Surgeon: Excell Seltzer, MD;  Location: [place];  Service: General;  Laterality: Left;  . PORTACATH PLACEMENT N/A 07/11/2016   Procedure: INSERTION PORT-A-CATH;  Surgeon: Excell Seltzer, MD;  [facility];  Service: General;  Laterality: N/A;  . RADIOACTIVE SEED GUIDED AXILLARY SENTINEL LYMPH NODE Right 12/19/2016  Procedure: RADIOACTIVE SEED GUIDED AXILLARY SENTINEL LYMPH NODE BIOPSY;  Surgeon: Excell Seltzer, MD;  Location: [place];  Service: General;  Laterality: Right;  . WISDOM TOOTH EXTRACTION  yrs ago    There were no vitals filed for this visit.   Subjective Assessment - 11/11/20 1308    Subjective The sores in my mouth are so much worse.    Pertinent History R breast cancer, R lumpectomy in 01-04-17 with SLNB (3 and some were positive), then had ALNB (20  some were postive), pt completed chemo and radiation, then pt had recurrence in May 2021 and is stage IV, it has spead to lymph nodes and lung    Patient Stated Goals to decrease swelling in arm    Currently in Pain? Yes    Pain Score 9     Pain Location Mouth    Pain Descriptors / Indicators Sharp;Radiating    Pain Type Acute pain    Pain Onset In the past 7 days    Pain Frequency Constant    Aggravating Factors  eating and drinking and talking    Pain Relieving Factors brushing teeth and rinse with mouthwash    Effect of Pain on Daily Activities hard to eat and talk                     [place] from 09/08/2020 in [place]  Lymphedema Life Impact Scale Total Score 57.35 %            [facility] Adult PT Treatment/Exercise - 11/11/20 0001      Manual Therapy   Edema Management educated pt about compression bra and need for one for long term management of edema, issued info on FlexiTouch compression pump and sent demographics to [employer] per pt request    Manual Lymphatic Drainage (MLD) short neck, left axillary LN and right inguinal LN, anterior interaxillary anastomosis, right axillo-inguinal pathway, R breast, right UE starting proximally and working distally to hand, retracing all steps    Passive ROM PROM Right shoulder in all directions- v/c to engage scapular muscles to decrease pinching pain                       PT Long Term Goals - 10/19/20 1136      PT LONG TERM GOAL #1   Title Pt will obtain appropriate compression garments for long term management of lymphedmea.    Time 4    Period Weeks    Status Achieved   Just received and hasn't worn yet     PT LONG TERM GOAL #2   Title Pt will demonstrate 120 degrees of left shoulder flexion to allow her to reach overhead.    Baseline 95    Time 4    Period Weeks    Status Achieved      PT LONG TERM GOAL #3   Title Pt will demonstrate 90  degrees of right shoulder abduction to allow her to reach out to the side.    Baseline 62    Time 4    Period Weeks    Status Achieved      PT LONG TERM GOAL #4   Title Pt will be independent with a home exercise program for continued strengthening and stretching.    Time 4    Period Weeks    Status Partially Met      PT LONG TERM GOAL #5  Title Pt will report a 50% improvement in R axillary pain to allow improved comfort.    Time 4    Period Weeks    Status Achieved      Additional Long Term Goals   Additional Long Term Goals Yes      PT LONG TERM GOAL #6   Title Pt will have left shoulder flex 145 degrees for improved reaching ability    Time 4    Period Weeks    Status New    Target Date 11/16/20      PT LONG TERM GOAL #7   Title Pt. is independent in self management of Lymphedema    Time 4    Period Weeks    Status New                 Plan - 11/11/20 1408    Clinical Impression Statement Pt is having less pain in her right shoulder. Continued with ROM to R shoulder today following by manual lymphatic drainage to R UE and breast. Pt's fibrosis in her right breast is softening but her right breast remains full. She would benefit from a compression bra. Therapist measured pt today for a compression bra and sent info to DME supplier. Also educated pt about pneumatic compression pump for managment of lymphedema and pt is interested in this. She has failed four weeks of conservative therapy and would benefit from a compression pump to help manage her right breast and UE lymphedema and decrease fibrosis in her R breast.    PT Frequency 2x / week    PT Duration 4 weeks    PT Treatment/Interventions ADLs/Self Care Home Management;Therapeutic exercise;Patient/family education;Manual techniques;Manual lymph drainage;Compression bandaging;Scar mobilization;Passive range of motion;Taping;Vasopneumatic Device    PT Next Visit Plan assess indep with supine scap, Cont STW, PROM,  MLD, reassess sleeve/gauntlet    Recommended Other Services measured pt for compression bra on 12/9 and sent email to [place], sent demographics to [employer] and Agree with Plan of Care Patient           Patient will benefit from skilled therapeutic intervention in order to improve the following deficits and impairments:  Pain,Postural dysfunction,Impaired UE functional use,Increased fascial restricitons,Decreased strength,Decreased knowledge of use of DME,Decreased knowledge of precautions,Decreased range of motion,Increased edema  Visit Diagnosis: Stiffness of right shoulder, not elsewhere classified  Lymphedema, not elsewhere classified     Problem List Patient Active Problem List   Diagnosis Date Noted  . Stomatitis 08/10/2020  . Varicose veins of bilateral lower extremities with other complications 79/01/4096  . Dry skin dermatitis 07/27/2019  . Appendicitis 11/28/2018  . De Quervain's disease (tenosynovitis) 05/15/2018  . Bilateral carpal tunnel syndrome 05/15/2018  . Neuropathy due to chemotherapeutic drug ([place]) 02/11/2018  . Peripheral neuropathy 08/13/2017  . Osteoporosis 03/23/2017  . Breast cancer metastasized to axillary lymph node ([place]) 06/21/2016  . Malignant neoplasm of upper-outer quadrant of right breast in female, estrogen receptor positive ([place]) 06/21/2016  . Elevated blood pressure reading without diagnosis of hypertension 04/28/2016  . Herpes genitalis--since 2005 per patient   . Environmental and seasonal allergies 07/28/2012  . Fibromyalgia   . Anxiety 02/07/2012  . Depression (emotion) 01/04/2012  . Headache-migranes 01/04/2012    Manus Gunning 11/11/2020, 2:12 PM  [place], [place], [address] Phone: [phone]   Fax:  [phone]  Name: April [name] MRN: [id number] Date of Birth: 1961/06/15  Allyson Sabal  [place], PT 11/11/20 2:12 PM

## 2020-11-12 ENCOUNTER — Ambulatory Visit: Payer: Medicaid Other | Admitting: Internal Medicine

## 2020-11-12 ENCOUNTER — Telehealth: Payer: Self-pay | Admitting: *Deleted

## 2020-11-16 ENCOUNTER — Ambulatory Visit: Payer: Medicaid Other

## 2020-11-16 ENCOUNTER — Other Ambulatory Visit: Payer: Self-pay

## 2020-11-16 DIAGNOSIS — I890 Lymphedema, not elsewhere classified: Secondary | ICD-10-CM | POA: Diagnosis not present

## 2020-11-16 DIAGNOSIS — M25611 Stiffness of right shoulder, not elsewhere classified: Secondary | ICD-10-CM

## 2020-11-16 DIAGNOSIS — R293 Abnormal posture: Secondary | ICD-10-CM

## 2020-11-16 NOTE — Therapy (Signed)
[place], [place], [address] Phone: [phone]   Fax:  [phone]  Physical Therapy Treatment  Patient Details  Name: April [name] MRN: [id number] Date of Birth: 1961/10/05 Referring Provider (PT): Causey   Encounter Date: 11/16/2020   PT End of Session - 11/16/20 1006    Visit Number 13    Number of Visits 17    Date for PT Re-Evaluation 11/16/20    Authorization Type Medicaid healthy blue    Authorization - Visit Number 12    Authorization - Number of Visits 27    PT Start Time 0907   pt arrived late   PT Stop Time 1004    PT Time Calculation (min) 57 min    Activity Tolerance Patient tolerated treatment well    Behavior During Therapy [facility] for tasks assessed/performed           Past Medical History:  Diagnosis Date  . Allergy 07/28/2012   Seasonal/Environmental allergies  . Anxiety 2013   Since 2013  . Arthritis 2014 per patient    knees and shoulders  . Bilateral ankle fractures 07/2015   Booted and resolved   . Cancer [facility]) dx June 22, 2016   right breast  . Depression 2013   Multiple  episodes  in past.  . Elevated cholesterol 2017  . Fibromyalgia 2013   diagnosed by Dr. [name]  . Genital herpes 2005   Has outbreaks monthly if not on preventative medication  . GERD (gastroesophageal reflux disease) 2013  . History of radiation therapy 02/07/17- 03/21/17   Right Breast- 4 field 25 fractions. 50 Gy to SCLV/PAB in 25 fractions. Right Breast Boost 10 gy in 5 fractions.  . Migraine 2013   migraines  . Neuromuscular disorder ([place]) 03/20/2017   neuropathy in fingers and toes from Chemo--intermittent  . Obesity   . Osteoporosis 03/23/2017   noted per bone density scan  . Peripheral neuropathy 08/13/2017  . Personal history of chemotherapy 11/2016  . Personal history of radiation therapy    4/18  . Right wrist fracture 06/2015   Resolved  . Scoliosis of thoracic spine  01/04/2012  . Skin condition 2012   patient reports periodic episodes of severe itching. She will itch and then blister at areas including her arms, back, and buttocks.   . Urinary, incontinence, stress female 07/14/2016   patient reported    Past Surgical History:  Procedure Laterality Date  . AXILLARY LYMPH NODE DISSECTION Right 12/26/2016   Procedure: RIGHT AXILLARY LYMPH NODE DISSECTION;  Surgeon: Excell Seltzer, MD;  Location: [place];  Service: General;  Laterality: Right;  . BREAST LUMPECTOMY Right 2018  . BREAST LUMPECTOMY WITH NEEDLE LOCALIZATION Right 12/19/2016   Procedure: RIGHT BREAST NEEDLE LOCALIZED LUMPECTOMY, RIGHT RADIOACTIVE SEED TARGETED AXILLARY SENTINEL LYMPH NODE BIOPSY;  Surgeon: Excell Seltzer, MD;  Location: [place];  Service: General;  Laterality: Right;  . IR GENERIC HISTORICAL  10/09/2016   IR CV LINE INJECTION 10/09/2016 Aletta Edouard, MD [facility]  . LAPAROSCOPIC APPENDECTOMY N/A 11/28/2018   Procedure: APPENDECTOMY LAPAROSCOPIC;  Surgeon: [name], MD;  Location: [place];  Service: General;  Laterality: N/A;  . PORT-A-CATH REMOVAL Left 12/19/2016   Procedure: REMOVAL PORT-A-CATH;  Surgeon: Excell Seltzer, MD;  Location: [place];  Service: General;  Laterality: Left;  . PORTACATH PLACEMENT N/A 07/11/2016   Procedure: INSERTION PORT-A-CATH;  Surgeon: Excell Seltzer, MD;  [facility];  Service: General;  Laterality: N/A;  . RADIOACTIVE SEED GUIDED AXILLARY SENTINEL LYMPH NODE Right 12/19/2016   Procedure: RADIOACTIVE SEED GUIDED AXILLARY SENTINEL LYMPH NODE BIOPSY;  Surgeon: Excell Seltzer, MD;  Location: [place];  Service: General;  Laterality: Right;  . WISDOM TOOTH EXTRACTION  yrs ago    There were no vitals filed for this visit.   Subjective Assessment - 11/16/20 0913    Subjective The sores are some better today, I can at least talk better today. My Rt arm is  feeling so much better overall. I can even reach up to turn on my celing fan now with my Rt arm. The pump people called me and I'm going to get that as well. She said I'm approved with insurance, they just need the papaerwork back from my oncologist so I'm going to call them about that today.    Pertinent History R breast cancer, R lumpectomy in 01-04-17 with SLNB (3 and some were positive), then had ALNB (20 some were postive), pt completed chemo and radiation, then pt had recurrence in May 2021 and is stage IV, it has spead to lymph nodes and lung    Patient Stated Goals to decrease swelling in arm    Currently in Pain? Yes    Pain Score 3     Pain Location Mouth    Pain Orientation Right;Left    Pain Descriptors / Indicators Dull;Aching    Pain Type Acute pain    Pain Onset 1 to 4 weeks ago    Pain Frequency Constant    Aggravating Factors  eating, talking    Pain Relieving Factors brushing teeth and mouth rinse                     Flowsheet Row [facility]patient [facility]  Lymphedema Life Impact Scale Total Score 57.35 %            [facility] Adult PT Treatment/Exercise - 11/16/20 0001      Manual Therapy   Manual Lymphatic Drainage (MLD) short neck, left axillary LN and right inguinal LN, anterior interaxillary anastomosis, right axillo-inguinal pathway, R breast, right UE starting proximally and working distally to hand, retracing all steps    Passive ROM PROM Right shoulder in all directions- v/c to engage scapular muscles to decrease pinching pain                       PT Long Term Goals - 10/19/20 1136      PT LONG TERM GOAL #1   Title Pt will obtain appropriate compression garments for long term management of lymphedmea.    Time 4    Period Weeks    Status Achieved   Just received and hasn't worn yet     PT LONG TERM GOAL #2   Title Pt will demonstrate 120 degrees of left shoulder flexion to  allow her to reach overhead.    Baseline 95    Time 4    Period Weeks    Status Achieved      PT LONG TERM GOAL #3   Title Pt will demonstrate 90 degrees of right shoulder abduction to allow her to reach out to the side.    Baseline 62    Time 4    Period Weeks    Status Achieved      PT LONG TERM GOAL #4   Title Pt will be independent  with a home exercise program for continued strengthening and stretching.    Time 4    Period Weeks    Status Partially Met      PT LONG TERM GOAL #5   Title Pt will report a 50% improvement in R axillary pain to allow improved comfort.    Time 4    Period Weeks    Status Achieved      Additional Long Term Goals   Additional Long Term Goals Yes      PT LONG TERM GOAL #6   Title Pt will have left shoulder flex 145 degrees for improved reaching ability    Time 4    Period Weeks    Status New    Target Date 11/16/20      PT LONG TERM GOAL #7   Title Pt. is independent in self management of Lymphedema    Time 4    Period Weeks    Status New                 Plan - 11/16/20 1007    Clinical Impression Statement Continued with manual lymph drainage to Rt breast and P/ROM of Rt shoulder. Pt reports feeling of reduction of tightness and discomfort at Rt axilla after manual therapy. Pt has heard from [employer] but just that they have her info and she should heard back soon about a pump demo. Pt reports doing supine scapular series and no questions.    Personal Factors and Comorbidities Fitness;Time since onset of injury/illness/exacerbation;Comorbidity 2    Comorbidities scoliosis, previous hx of breast cancer with radiation    Examination-Activity Limitations Reach Overhead;Carry;Lift    Examination-Participation Restrictions Cleaning;Shop;Community Activity;Laundry    Stability/Clinical Decision Making Evolving/Moderate complexity    Rehab Potential Good    PT Frequency 2x / week    PT Duration 4 weeks    PT Treatment/Interventions  ADLs/Self Care Home Management;Therapeutic exercise;Patient/family education;Manual techniques;Manual lymph drainage;Compression bandaging;Scar mobilization;Passive range of motion;Taping;Vasopneumatic Device    PT Next Visit Plan assess indep with supine scap, Cont STW, PROM, MLD, reassess sleeve/gauntlet when she brings it    PT Home Exercise Plan Supine scapular series    Consulted and Agree with Plan of Care Patient           Patient will benefit from skilled therapeutic intervention in order to improve the following deficits and impairments:  Pain,Postural dysfunction,Impaired UE functional use,Increased fascial restricitons,Decreased strength,Decreased knowledge of use of DME,Decreased knowledge of precautions,Decreased range of motion,Increased edema  Visit Diagnosis: Stiffness of right shoulder, not elsewhere classified  Lymphedema, not elsewhere classified  Abnormal posture     Problem List Patient Active Problem List   Diagnosis Date Noted  . Stomatitis 08/10/2020  . Varicose veins of bilateral lower extremities with other complications 01/60/1093  . Dry skin dermatitis 07/27/2019  . Appendicitis 11/28/2018  . De Quervain's disease (tenosynovitis) 05/15/2018  . Bilateral carpal tunnel syndrome 05/15/2018  . Neuropathy due to chemotherapeutic drug ([place]) 02/11/2018  . Peripheral neuropathy 08/13/2017  . Osteoporosis 03/23/2017  . Breast cancer metastasized to axillary lymph node ([place]) 06/21/2016  . Malignant neoplasm of upper-outer quadrant of right breast in female, estrogen receptor positive ([place]) 06/21/2016  . Elevated blood pressure reading without diagnosis of hypertension 04/28/2016  . Herpes genitalis--since 2005 per patient   . Environmental and seasonal allergies 07/28/2012  . Fibromyalgia   . Anxiety 02/07/2012  . Depression (emotion) 01/04/2012  . Headache-migranes 01/04/2012    Otelia Limes, PTA 11/16/2020,  1:01 PM  [place], [place], [address] Phone: [phone]   Fax:  [phone]  Name: April [name] MRN: [id number] Date of Birth: November 08, 1961

## 2020-11-18 ENCOUNTER — Other Ambulatory Visit: Payer: Self-pay

## 2020-11-18 ENCOUNTER — Ambulatory Visit: Payer: Medicaid Other

## 2020-11-18 DIAGNOSIS — I890 Lymphedema, not elsewhere classified: Secondary | ICD-10-CM | POA: Diagnosis not present

## 2020-11-18 DIAGNOSIS — R293 Abnormal posture: Secondary | ICD-10-CM

## 2020-11-18 DIAGNOSIS — C773 Secondary and unspecified malignant neoplasm of axilla and upper limb lymph nodes: Secondary | ICD-10-CM

## 2020-11-18 DIAGNOSIS — M25611 Stiffness of right shoulder, not elsewhere classified: Secondary | ICD-10-CM

## 2020-11-18 DIAGNOSIS — M6281 Muscle weakness (generalized): Secondary | ICD-10-CM

## 2020-11-18 DIAGNOSIS — M25511 Pain in right shoulder: Secondary | ICD-10-CM

## 2020-11-18 DIAGNOSIS — M79601 Pain in right arm: Secondary | ICD-10-CM

## 2020-11-18 DIAGNOSIS — C50911 Malignant neoplasm of unspecified site of right female breast: Secondary | ICD-10-CM

## 2020-11-18 NOTE — Therapy (Signed)
[name], [place], [address] Phone: [phone]   Fax:  [phone]  Physical Therapy Treatment  Patient Details  Name: April [name] MRN: [id number] Date of Birth: 02-12-1961 Referring Provider (PT): Causey   Encounter Date: 11/18/2020   PT End of Session - 11/18/20 1740    Visit Number 14    Number of Visits 25    Date for PT Re-Evaluation 12/16/20    Authorization - Visit Number 13    Authorization - Number of Visits 27    PT Start Time 2703    PT Stop Time 5009    PT Time Calculation (min) 46 min    Activity Tolerance Patient tolerated treatment well    Behavior During Therapy [facility] for tasks assessed/performed           Past Medical History:  Diagnosis Date   Allergy 07/28/2012   Seasonal/Environmental allergies   Anxiety 2013   Since 2013   Arthritis 2014 per patient    knees and shoulders   Bilateral ankle fractures 07/2015   Booted and resolved    Cancer [facility]) dx June 22, 2016   right breast   Depression 2013   Multiple  episodes  in past.   Elevated cholesterol 2017   Fibromyalgia 2013   diagnosed by Dr. [name]   Genital herpes 2005   Has outbreaks monthly if not on preventative medication   GERD (gastroesophageal reflux disease) 2013   History of radiation therapy 02/07/17- 03/21/17   Right Breast- 4 field 25 fractions. 50 Gy to SCLV/PAB in 25 fractions. Right Breast Boost 10 gy in 5 fractions.   Migraine 2013   migraines   Neuromuscular disorder ([place]) 03/20/2017   neuropathy in fingers and toes from Chemo--intermittent   Obesity    Osteoporosis 03/23/2017   noted per bone density scan   Peripheral neuropathy 08/13/2017   Personal history of chemotherapy 11/2016   Personal history of radiation therapy    4/18   Right wrist fracture 06/2015   Resolved   Scoliosis of thoracic spine 01/04/2012   Skin condition 2012   patient reports periodic  episodes of severe itching. She will itch and then blister at areas including her arms, back, and buttocks.    Urinary, incontinence, stress female 07/14/2016   patient reported    Past Surgical History:  Procedure Laterality Date   AXILLARY LYMPH NODE DISSECTION Right 12/26/2016   Procedure: RIGHT AXILLARY LYMPH NODE DISSECTION;  Surgeon: Excell Seltzer, MD;  Location: [place];  Service: General;  Laterality: Right;   BREAST LUMPECTOMY Right 2018   BREAST LUMPECTOMY WITH NEEDLE LOCALIZATION Right 12/19/2016   Procedure: RIGHT BREAST NEEDLE LOCALIZED LUMPECTOMY, RIGHT RADIOACTIVE SEED TARGETED AXILLARY SENTINEL LYMPH NODE BIOPSY;  Surgeon: Excell Seltzer, MD;  Location: [place];  Service: General;  Laterality: Right;   IR GENERIC HISTORICAL  10/09/2016   IR CV LINE INJECTION 10/09/2016 Aletta Edouard, MD [facility]   LAPAROSCOPIC APPENDECTOMY N/A 11/28/2018   Procedure: APPENDECTOMY LAPAROSCOPIC;  Surgeon: [name], MD;  Location: [place];  Service: General;  Laterality: N/A;   PORT-A-CATH REMOVAL Left 12/19/2016   Procedure: REMOVAL PORT-A-CATH;  Surgeon: Excell Seltzer, MD;  Location: [place];  Service: General;  Laterality: Left;   PORTACATH PLACEMENT N/A 07/11/2016   Procedure: INSERTION PORT-A-CATH;  Surgeon: Excell Seltzer, MD;  [facility];  Service: General;  Laterality: N/A;   RADIOACTIVE SEED [place]  LYMPH NODE Right 12/19/2016   Procedure: RADIOACTIVE SEED GUIDED AXILLARY SENTINEL LYMPH NODE BIOPSY;  Surgeon: Excell Seltzer, MD;  Location: [place];  Service: General;  Laterality: Right;   WISDOM TOOTH EXTRACTION  yrs ago    There were no vitals filed for this visit.   Subjective Assessment - 11/18/20 1610    Subjective Mouth is doing better but I am concerned because I am supposed to be on this chemo drug for the remainder of my life.  I have lost 26 lbs  because I can't eat well. Pt has not recieved her pump yet.  Awaiting script from MD re; pump.  ROM of my arm is so much better and my breast pain is 75% better than it was.   Still have swelling in the arm. Wear sleeve every day for several hours but I get so hot.  Got the compression bra yesterday.  After long periods of walking/shopping my whole hand and arm is numb    Pertinent History R breast cancer, R lumpectomy in 01-04-17 with SLNB (3 and some were positive), then had ALNB (20 some were postive), pt completed chemo and radiation, then pt had recurrence in May 2021 and is stage IV, it has spead to lymph nodes and lung    Patient Stated Goals to decrease swelling in arm    Currently in Pain? Yes    Pain Score 2     Pain Location Mouth    Pain Descriptors / Indicators Aching    Pain Type Acute pain    Pain Onset 1 to 4 weeks ago    Pain Frequency Constant    Pain Relieving Factors brushing teeth, mouth rinse              [facility] PT Assessment - 11/18/20 0001      AROM   Right Shoulder Extension 49 Degrees    Right Shoulder Flexion 150 Degrees    Right Shoulder ABduction 128 Degrees             LYMPHEDEMA/ONCOLOGY QUESTIONNAIRE - 11/18/20 0001      Right Upper Extremity Lymphedema   15 cm Proximal to Olecranon Process 39.8 cm    Olecranon Process 32.2 cm    15 cm Proximal to Ulnar Styloid Process 32.4 cm    Just Proximal to Ulnar Styloid Process 20.6 cm    Across Hand at [employer] 20.8 cm    At [place] of 2nd Digit 7 cm      Left Upper Extremity Lymphedema   15 cm Proximal to Olecranon Process 38 cm    Olecranon Process 31.8 cm    15 cm Proximal to Ulnar Styloid Process 29 cm    Just Proximal to Ulnar Styloid Process 18.3 cm    Across Hand at [employer] 20.4 cm    At [place] of 2nd Digit 6.6 cm              Flowsheet Row [facility]patient Re[facility]08/[facility]  Lymphedema Life Impact Scale Total Score 57.35 %             [facility] Adult PT Treatment/Exercise - 11/18/20 0001      Shoulder Exercises: Supine   Horizontal ABduction Strengthening;Both;12 reps    Theraband Level (Shoulder Horizontal ABduction) Level 1 (Yellow)    External Rotation Strengthening;Both;12 reps    Theraband Level (Shoulder External Rotation) Level 1 (Yellow)    Flexion Both;10 reps  Theraband Level (Shoulder Flexion) Level 1 (Yellow)    Diagonals Strengthening;Right;Left;12 reps    Theraband Level (Shoulder Diagonals) Level 1 (Yellow)      Shoulder Exercises: Pulleys   Flexion 2 minutes    ABduction 2 minutes                  PT Education - 11/18/20 1739    Education Details Discussed pt trying to put sleeve on first thing in am and wear for the day to keep swelling down unless it is uncomfortaable for any reason then she should remove    Person(s) Educated Patient    Methods Explanation    Comprehension Verbalized understanding               PT Long Term Goals - 11/18/20 1637      PT LONG TERM GOAL #1   Title Pt will obtain appropriate compression garments for long term management of lymphedmea.    Baseline achieved sleeve and compression bra    Time 4    Period Weeks    Status Achieved      PT LONG TERM GOAL #2   Title Pt will demonstrate 120 degrees of left shoulder flexion to allow her to reach overhead.    Time 4    Status Achieved      PT LONG TERM GOAL #3   Title Pt will demonstrate 90 degrees of right shoulder abduction to allow her to reach out to the side.    Time 4    Period Weeks    Status Achieved      PT LONG TERM GOAL #4   Title Pt will be independent with a home exercise program for continued strengthening and stretching.    Time 4    Period Weeks    Status Achieved      PT LONG TERM GOAL #5   Title Pt will report a 50% improvement in R axillary pain to allow improved comfort.    Time 4    Period Weeks    Status Achieved      PT LONG TERM GOAL #6   Title Pt  will have left shoulder flex 145 degrees for improved reaching ability    Period Weeks    Status Achieved      PT LONG TERM GOAL #7   Title Pt. is independent in self management of Lymphedema    Baseline pt awaiting flexi touch, and will try to wear compression sleeve greater than 2 hours at a time to keep swelling down.    Time 4    Period Weeks    Status On-going                 Plan - 11/18/20 1741    Clinical Impression Statement Pt has made excellent improvements in shoulder ROM especially for flexion, but does still lack ROM in true abduction secondary to axillary tightness.  Her breast pain is improved by 75% from the beginning.  She now has her compression garments and compression bra.  she is going to try to wear her compression sleeve/gauntlet for longer periods of time to prevent UE swelling.  She is awaiting her flexitouch.  She will continue 1-2x per week until her Flexitouch is recieved with emphasis on Abd ROM, review of precautions/exercises prn, and MLD.    Personal Factors and Comorbidities Fitness;Time since onset of injury/illness/exacerbation;Comorbidity 2    Comorbidities scoliosis, previous hx of breast cancer with radiation  Examination-Activity Limitations Reach Overhead;Carry;Lift    Examination-Participation Restrictions Cleaning;Shop;Community Activity;Laundry    Stability/Clinical Decision Making Evolving/Moderate complexity    Clinical Decision Making Moderate    Rehab Potential Good    PT Frequency 2x / week    PT Duration 4 weeks    PT Treatment/Interventions ADLs/Self Care Home Management;Therapeutic exercise;Patient/family education;Manual techniques;Manual lymph drainage;Compression bandaging;Scar mobilization;Passive range of motion;Taping;Vasopneumatic Device    PT Next Visit Plan Cont STW, PROM especially abd, MLD, encourage to wear sleeve longer periods of time during the day to prevent increased swelling.  Continue until flexitouch recieved     PT Home Exercise Plan Supine scapular series    Consulted and Agree with Plan of Care Patient           Patient will benefit from skilled therapeutic intervention in order to improve the following deficits and impairments:  Pain,Postural dysfunction,Impaired UE functional use,Increased fascial restricitons,Decreased strength,Decreased knowledge of use of DME,Decreased knowledge of precautions,Decreased range of motion,Increased edema  Visit Diagnosis: Stiffness of right shoulder, not elsewhere classified  Lymphedema, not elsewhere classified  Abnormal posture  Pain in right arm  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)  Acute pain of right shoulder  Muscle weakness (generalized)     Problem List Patient Active Problem List   Diagnosis Date Noted   Stomatitis 08/10/2020   Varicose veins of bilateral lower extremities with other complications 41/63/8453   Dry skin dermatitis 07/27/2019   Appendicitis 11/28/2018   De Quervain's disease (tenosynovitis) 05/15/2018   Bilateral carpal tunnel syndrome 05/15/2018   Neuropathy due to chemotherapeutic drug ([place]) 02/11/2018   Peripheral neuropathy 08/13/2017   Osteoporosis 03/23/2017   Breast cancer metastasized to axillary lymph node ([place]) 06/21/2016   Malignant neoplasm of upper-outer quadrant of right breast in female, estrogen receptor positive ([place]) 06/21/2016   Elevated blood pressure reading without diagnosis of hypertension 04/28/2016   Herpes genitalis--since 2005 per patient    Environmental and seasonal allergies 07/28/2012   Fibromyalgia    Anxiety 02/07/2012   Depression (emotion) 01/04/2012   Headache-migranes 01/04/2012   Check all possible CPT codes: 97110- Therapeutic Exercise, 97140 - Manual Therapy, 97535 - Self Care, 97016 - Vaso and [phone] - Orthotic Fit                    Claris Pong 11/18/2020, 5:48 PM  [place], [place], [address] Phone: [phone]   Fax:  [phone]  Name: CATALEA LABRECQUE MRN: [id number] Date of Birth: 08/23/1961 Cheral Almas, PT 11/18/20 5:58 PM

## 2020-11-22 ENCOUNTER — Telehealth: Payer: Self-pay

## 2020-11-22 NOTE — Telephone Encounter (Signed)
Returned fax to [employer] at [phone], received confirmation. Returned signed order for pneumatic compression device.

## 2020-11-23 ENCOUNTER — Ambulatory Visit: Payer: Medicaid Other

## 2020-11-23 ENCOUNTER — Other Ambulatory Visit: Payer: Self-pay

## 2020-11-23 DIAGNOSIS — M79601 Pain in right arm: Secondary | ICD-10-CM

## 2020-11-23 DIAGNOSIS — C50911 Malignant neoplasm of unspecified site of right female breast: Secondary | ICD-10-CM

## 2020-11-23 DIAGNOSIS — M6281 Muscle weakness (generalized): Secondary | ICD-10-CM

## 2020-11-23 DIAGNOSIS — R293 Abnormal posture: Secondary | ICD-10-CM

## 2020-11-23 DIAGNOSIS — M62838 Other muscle spasm: Secondary | ICD-10-CM

## 2020-11-23 DIAGNOSIS — I890 Lymphedema, not elsewhere classified: Secondary | ICD-10-CM | POA: Diagnosis not present

## 2020-11-23 DIAGNOSIS — M25611 Stiffness of right shoulder, not elsewhere classified: Secondary | ICD-10-CM

## 2020-11-23 DIAGNOSIS — C773 Secondary and unspecified malignant neoplasm of axilla and upper limb lymph nodes: Secondary | ICD-10-CM

## 2020-11-23 DIAGNOSIS — M25511 Pain in right shoulder: Secondary | ICD-10-CM

## 2020-11-23 NOTE — Therapy (Signed)
[facility] [address] [place], [place], [address] Phone: [phone]   Fax:  [phone]  Physical Therapy Treatment  Patient Details  Name: April [name] MRN: [id number] Date of Birth: Aug 05, 1961 Referring Provider (PT): Causey   Encounter Date: 11/23/2020   PT End of Session - 11/23/20 0951    Visit Number 15    Number of Visits 25    Date for PT Re-Evaluation 12/16/20    Authorization - Visit Number 14    Authorization - Number of Visits 27    PT Start Time 0908    PT Stop Time 0955    PT Time Calculation (min) 47 min           Past Medical History:  Diagnosis Date  . Allergy 07/28/2012   Seasonal/Environmental allergies  . Anxiety 2013   Since 2013  . Arthritis 2014 per patient    knees and shoulders  . Bilateral ankle fractures 07/2015   Booted and resolved   . Cancer [facility]) dx June 22, 2016   right breast  . Depression 2013   Multiple  episodes  in past.  . Elevated cholesterol 2017  . Fibromyalgia 2013   diagnosed by Dr. Corliss Skains  . Genital herpes 2005   Has outbreaks monthly if not on preventative medication  . GERD (gastroesophageal reflux disease) 2013  . History of radiation therapy 02/07/17- 03/21/17   Right Breast- 4 field 25 fractions. 50 Gy to SCLV/PAB in 25 fractions. Right Breast Boost 10 gy in 5 fractions.  . Migraine 2013   migraines  . Neuromuscular disorder (HCC) 03/20/2017   neuropathy in fingers and toes from Chemo--intermittent  . Obesity   . Osteoporosis 03/23/2017   noted per bone density scan  . Peripheral neuropathy 08/13/2017  . Personal history of chemotherapy 11/2016  . Personal history of radiation therapy    4/18  . Right wrist fracture 06/2015   Resolved  . Scoliosis of thoracic spine 01/04/2012  . Skin condition 2012   patient reports periodic episodes of severe itching. She will itch and then blister at areas including her arms, back, and buttocks.   . Urinary,  incontinence, stress female 07/14/2016   patient reported    Past Surgical History:  Procedure Laterality Date  . AXILLARY LYMPH NODE DISSECTION Right 12/26/2016   Procedure: RIGHT AXILLARY LYMPH NODE DISSECTION;  Surgeon: Glenna Fellows, MD;  Location: [facility];  Service: General;  Laterality: Right;  . BREAST LUMPECTOMY Right 2018  . BREAST LUMPECTOMY WITH NEEDLE LOCALIZATION Right 12/19/2016   Procedure: RIGHT BREAST NEEDLE LOCALIZED LUMPECTOMY, RIGHT RADIOACTIVE SEED TARGETED AXILLARY SENTINEL LYMPH NODE BIOPSY;  Surgeon: Glenna Fellows, MD;  Location: [facility];  Service: General;  Laterality: Right;  . IR GENERIC HISTORICAL  10/09/2016   IR CV LINE INJECTION 10/09/2016 Irish Lack, MD [facility]  . LAPAROSCOPIC APPENDECTOMY N/A 11/28/2018   Procedure: APPENDECTOMY LAPAROSCOPIC;  Surgeon: Berna Bue, MD;  Location: [facility];  Service: General;  Laterality: N/A;  . PORT-A-CATH REMOVAL Left 12/19/2016   Procedure: REMOVAL PORT-A-CATH;  Surgeon: Glenna Fellows, MD;  Location: [facility];  Service: General;  Laterality: Left;  . PORTACATH PLACEMENT N/A 07/11/2016   Procedure: INSERTION PORT-A-CATH;  Surgeon: Glenna Fellows, MD;  Location: [facility];  Service: General;  Laterality: N/A;  . RADIOACTIVE SEED GUIDED AXILLARY SENTINEL LYMPH NODE Right 12/19/2016   Procedure: RADIOACTIVE SEED GUIDED AXILLARY SENTINEL LYMPH NODE BIOPSY;  Surgeon: Glenna Fellows,  MD;  Location: [facility];  Service: General;  Laterality: Right;  . WISDOM TOOTH EXTRACTION  yrs ago    There were no vitals filed for this visit.   Subjective Assessment - 11/23/20 0908    Subjective I found out why the pump cpouldn't be ordered; The Dr. was out of town.  We got it squared away now and the script was received.  Should get pump in a week or 2.  Pt. 7 min late.    Pertinent History R breast cancer, R lumpectomy in 01-04-17 with SLNB (3 and some  were positive), then had ALNB (20 some were postive), pt completed chemo and radiation, then pt had recurrence in May 2021 and is stage IV, it has spead to lymph nodes and lung    Currently in Pain? Yes    Pain Score 2     Pain Location Axilla    Pain Orientation Right    Pain Descriptors / Indicators Tightness    Pain Onset More than a month ago    Pain Frequency Constant    Aggravating Factors  stretching                     Flowsheet Row [facility]  Lymphedema Life Impact Scale Total Score 57.35 %            [facility] Adult PT Treatment/Exercise - 11/23/20 0001      Shoulder Exercises: Supine   Horizontal ABduction Strengthening;Both;12 reps    Theraband Level (Shoulder Horizontal ABduction) Level 1 (Yellow)    External Rotation Strengthening;Both;12 reps    Theraband Level (Shoulder External Rotation) Level 1 (Yellow)    Flexion Both;10 reps    Theraband Level (Shoulder Flexion) Level 1 (Yellow)    Diagonals Strengthening;Right;Left;12 reps    Theraband Level (Shoulder Diagonals) Level 1 (Yellow)      Shoulder Exercises: Standing   Other Standing Exercises abd wall slides x 8    Other Standing Exercises right arm chest stretch at wall x 3      Manual Therapy   Manual Lymphatic Drainage (MLD) short neck, left axillary LN and right inguinal LN, anterior interaxillary anastomosis, right axillo-inguinal pathway, R breast, right UE starting proximally and working distally to hand, retracing all steps    Passive ROM PROM Right shoulder in all directions-                       PT Long Term Goals - 11/18/20 1637      PT LONG TERM GOAL #1   Title Pt will obtain appropriate compression garments for long term management of lymphedmea.    Baseline achieved sleeve and compression bra    Time 4    Period Weeks    Status Achieved      PT LONG TERM GOAL #2   Title Pt will demonstrate  120 degrees of left shoulder flexion to allow her to reach overhead.    Time 4    Status Achieved      PT LONG TERM GOAL #3   Title Pt will demonstrate 90 degrees of right shoulder abduction to allow her to reach out to the side.    Time 4    Period Weeks    Status Achieved      PT LONG TERM GOAL #4   Title Pt will be independent with a home exercise program for continued strengthening and stretching.  Time 4    Period Weeks    Status Achieved      PT LONG TERM GOAL #5   Title Pt will report a 50% improvement in R axillary pain to allow improved comfort.    Time 4    Period Weeks    Status Achieved      PT LONG TERM GOAL #6   Title Pt will have left shoulder flex 145 degrees for improved reaching ability    Period Weeks    Status Achieved      PT LONG TERM GOAL #7   Title Pt. is independent in self management of Lymphedema    Baseline pt awaiting flexi touch, and will try to wear compression sleeve greater than 2 hours at a time to keep swelling down.    Time 4    Period Weeks    Status On-going                 Plan - 11/23/20 7824    Clinical Impression Statement Pt is noticing less swelling in her arm now that she is wearing sleeve for longer periods of time.  ROM is progressing nicely with mild restriction still evident with abd.  Good form with supine exercises.    Personal Factors and Comorbidities Fitness;Time since onset of injury/illness/exacerbation;Comorbidity 2    Comorbidities scoliosis, previous hx of breast cancer with radiation    Examination-Activity Limitations Reach Overhead;Carry;Lift    Examination-Participation Restrictions Cleaning;Shop;Community Activity;Laundry    Stability/Clinical Decision Making Evolving/Moderate complexity    Clinical Decision Making Moderate    Rehab Potential Good    PT Frequency 2x / week    PT Duration 4 weeks    PT Treatment/Interventions ADLs/Self Care Home Management;Therapeutic exercise;Patient/family  education;Manual techniques;Manual lymph drainage;Compression bandaging;Scar mobilization;Passive range of motion;Taping;Vasopneumatic Device    PT Next Visit Plan Cont STW, PROM especially abd, MLD, encourage to wear sleeve longer periods of time during the day to prevent increased swelling.  Continue until flexitouch recieved    PT Home Exercise Plan Supine scapular series    Consulted and Agree with Plan of Care Patient           Patient will benefit from skilled therapeutic intervention in order to improve the following deficits and impairments:  Pain,Postural dysfunction,Impaired UE functional use,Increased fascial restricitons,Decreased strength,Decreased knowledge of use of DME,Decreased knowledge of precautions,Decreased range of motion,Increased edema  Visit Diagnosis: Stiffness of right shoulder, not elsewhere classified  Lymphedema, not elsewhere classified  Abnormal posture  Pain in right arm  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 Stomatitis 08/10/2020  . Varicose veins of bilateral lower extremities with other complications 23/53/6144  . Dry skin dermatitis 07/27/2019  . Appendicitis 11/28/2018  . De Quervain's disease (tenosynovitis) 05/15/2018  . Bilateral carpal tunnel syndrome 05/15/2018  . Neuropathy due to chemotherapeutic drug ([place]) 02/11/2018  . Peripheral neuropathy 08/13/2017  . Osteoporosis 03/23/2017  . Breast cancer metastasized to axillary lymph node ([place]) 06/21/2016  . Malignant neoplasm of upper-outer quadrant of right breast in female, estrogen receptor positive ([place]) 06/21/2016  . Elevated blood pressure reading without diagnosis of hypertension 04/28/2016  . Herpes genitalis--since 2005 per patient   . Environmental and seasonal allergies 07/28/2012  .  Fibromyalgia   . Anxiety 02/07/2012  . Depression (emotion) 01/04/2012  .  Headache-migranes 01/04/2012    Claris Pong 11/23/2020, 9:58 AM  [facility] [place], [place], [address] Phone: [phone]   Fax:  [phone]  Name: April [name] MRN: [id number] Date of Birth: [birth year]  Cheral Almas, PT 11/23/20 9:59 AM
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
## 2020-11-29 ENCOUNTER — Other Ambulatory Visit: Payer: Self-pay

## 2020-11-29 ENCOUNTER — Inpatient Hospital Stay: Payer: Medicare Other

## 2020-11-29 ENCOUNTER — Inpatient Hospital Stay (HOSPITAL_BASED_OUTPATIENT_CLINIC_OR_DEPARTMENT_OTHER): Payer: Medicare Other | Admitting: Adult Health

## 2020-11-29 ENCOUNTER — Ambulatory Visit: Payer: Medicaid Other

## 2020-11-29 ENCOUNTER — Inpatient Hospital Stay: Payer: Medicare Other | Attending: Oncology

## 2020-11-29 ENCOUNTER — Encounter: Payer: Self-pay | Admitting: Adult Health

## 2020-11-29 VITALS — BP 119/65 | HR 85 | Temp 98.4°F | Resp 17 | Ht 64.0 in | Wt 217.1 lb

## 2020-11-29 DIAGNOSIS — C50411 Malignant neoplasm of upper-outer quadrant of right female breast: Secondary | ICD-10-CM

## 2020-11-29 DIAGNOSIS — C773 Secondary and unspecified malignant neoplasm of axilla and upper limb lymph nodes: Secondary | ICD-10-CM | POA: Insufficient documentation

## 2020-11-29 DIAGNOSIS — Z87891 Personal history of nicotine dependence: Secondary | ICD-10-CM | POA: Diagnosis not present

## 2020-11-29 DIAGNOSIS — Z79899 Other long term (current) drug therapy: Secondary | ICD-10-CM | POA: Diagnosis not present

## 2020-11-29 DIAGNOSIS — Z79818 Long term (current) use of other agents affecting estrogen receptors and estrogen levels: Secondary | ICD-10-CM | POA: Diagnosis not present

## 2020-11-29 DIAGNOSIS — K121 Other forms of stomatitis: Secondary | ICD-10-CM | POA: Insufficient documentation

## 2020-11-29 DIAGNOSIS — M810 Age-related osteoporosis without current pathological fracture: Secondary | ICD-10-CM | POA: Diagnosis not present

## 2020-11-29 DIAGNOSIS — C50911 Malignant neoplasm of unspecified site of right female breast: Secondary | ICD-10-CM | POA: Diagnosis not present

## 2020-11-29 DIAGNOSIS — R519 Headache, unspecified: Secondary | ICD-10-CM | POA: Insufficient documentation

## 2020-11-29 DIAGNOSIS — M549 Dorsalgia, unspecified: Secondary | ICD-10-CM | POA: Insufficient documentation

## 2020-11-29 DIAGNOSIS — Z170 Estrogen receptor positive status [ER+]: Secondary | ICD-10-CM | POA: Diagnosis not present

## 2020-11-29 DIAGNOSIS — C50919 Malignant neoplasm of unspecified site of unspecified female breast: Secondary | ICD-10-CM

## 2020-11-29 LAB — CBC WITH DIFFERENTIAL/PLATELET
Abs Immature Granulocytes: 0 10*3/uL (ref 0.00–0.07)
Basophils Absolute: 0 10*3/uL (ref 0.0–0.1)
Basophils Relative: 1 %
Eosinophils Absolute: 0.1 10*3/uL (ref 0.0–0.5)
Eosinophils Relative: 3 %
HCT: 36.9 % (ref 36.0–46.0)
Hemoglobin: 12.6 g/dL (ref 12.0–15.0)
Immature Granulocytes: 0 %
Lymphocytes Relative: 32 %
Lymphs Abs: 0.7 10*3/uL (ref 0.7–4.0)
MCH: 36.1 pg — ABNORMAL HIGH (ref 26.0–34.0)
MCHC: 34.1 g/dL (ref 30.0–36.0)
MCV: 105.7 fL — ABNORMAL HIGH (ref 80.0–100.0)
Monocytes Absolute: 0.2 10*3/uL (ref 0.1–1.0)
Monocytes Relative: 7 %
Neutro Abs: 1.3 10*3/uL — ABNORMAL LOW (ref 1.7–7.7)
Neutrophils Relative %: 57 %
Platelets: 281 10*3/uL (ref 150–400)
RBC: 3.49 MIL/uL — ABNORMAL LOW (ref 3.87–5.11)
RDW: 13.8 % (ref 11.5–15.5)
WBC: 2.2 10*3/uL — ABNORMAL LOW (ref 4.0–10.5)
nRBC: 0 % (ref 0.0–0.2)

## 2020-11-29 LAB — COMPREHENSIVE METABOLIC PANEL
ALT: 11 U/L (ref 0–44)
AST: 11 U/L — ABNORMAL LOW (ref 15–41)
Albumin: 3.6 g/dL (ref 3.5–5.0)
Alkaline Phosphatase: 74 U/L (ref 38–126)
Anion gap: 5 (ref 5–15)
BUN: 18 mg/dL (ref 6–20)
CO2: 27 mmol/L (ref 22–32)
Calcium: 9.5 mg/dL (ref 8.9–10.3)
Chloride: 106 mmol/L (ref 98–111)
Creatinine, Ser: 1.01 mg/dL — ABNORMAL HIGH (ref 0.44–1.00)
GFR, Estimated: >60 mL/min (ref >60)
Glucose, Bld: 106 mg/dL — ABNORMAL HIGH (ref 70–99)
Potassium: 4.5 mmol/L (ref 3.5–5.1)
Sodium: 138 mmol/L (ref 135–145)
Total Bilirubin: 0.3 mg/dL (ref 0.3–1.2)
Total Protein: 7.2 g/dL (ref 6.5–8.1)

## 2020-11-29 MED ORDER — DENOSUMAB 60 MG/ML ~~LOC~~ SOSY
PREFILLED_SYRINGE | SUBCUTANEOUS | Status: AC
  Filled 2020-11-29: qty 1

## 2020-11-29 MED ORDER — FULVESTRANT 250 MG/5ML IM SOLN
500.0000 mg | INTRAMUSCULAR | Status: DC
  Administered 2020-11-29: 500 mg via INTRAMUSCULAR

## 2020-11-29 MED ORDER — FULVESTRANT 250 MG/5ML IM SOLN
INTRAMUSCULAR | Status: AC
  Filled 2020-11-29: qty 10

## 2020-11-29 NOTE — Patient Instructions (Signed)
Denosumab injection What is this medicine? DENOSUMAB (den oh sue mab) slows bone breakdown. Prolia is used to treat osteoporosis in women after menopause and in men, and in people who are taking corticosteroids for 6 months or more. Xgeva is used to treat a high calcium level due to cancer and to prevent bone fractures and other bone problems caused by multiple myeloma or cancer bone metastases. Xgeva is also used to treat giant cell tumor of the bone. This medicine may be used for other purposes; ask your health care provider or pharmacist if you have questions. COMMON BRAND NAME(S): Prolia, XGEVA What should I tell my health care provider before I take this medicine? They need to know if you have any of these conditions:  dental disease  having surgery or tooth extraction  infection  kidney disease  low levels of calcium or Vitamin D in the blood  malnutrition  on hemodialysis  skin conditions or sensitivity  thyroid or parathyroid disease  an unusual reaction to denosumab, other medicines, foods, dyes, or preservatives  pregnant or trying to get pregnant  breast-feeding How should I use this medicine? This medicine is for injection under the skin. It is given by a health care professional in a hospital or clinic setting. A special MedGuide will be given to you before each treatment. Be sure to read this information carefully each time. For Prolia, talk to your pediatrician regarding the use of this medicine in children. Special care may be needed. For Xgeva, talk to your pediatrician regarding the use of this medicine in children. While this drug may be prescribed for children as young as 13 years for selected conditions, precautions do apply. Overdosage: If you think you have taken too much of this medicine contact a poison control center or emergency room at once. NOTE: This medicine is only for you. Do not share this medicine with others. What if I miss a dose? It is  important not to miss your dose. Call your doctor or health care professional if you are unable to keep an appointment. What may interact with this medicine? Do not take this medicine with any of the following medications:  other medicines containing denosumab This medicine may also interact with the following medications:  medicines that lower your chance of fighting infection  steroid medicines like prednisone or cortisone This list may not describe all possible interactions. Give your health care provider a list of all the medicines, herbs, non-prescription drugs, or dietary supplements you use. Also tell them if you smoke, drink alcohol, or use illegal drugs. Some items may interact with your medicine. What should I watch for while using this medicine? Visit your doctor or health care professional for regular checks on your progress. Your doctor or health care professional may order blood tests and other tests to see how you are doing. Call your doctor or health care professional for advice if you get a fever, chills or sore throat, or other symptoms of a cold or flu. Do not treat yourself. This drug may decrease your body's ability to fight infection. Try to avoid being around people who are sick. You should make sure you get enough calcium and vitamin D while you are taking this medicine, unless your doctor tells you not to. Discuss the foods you eat and the vitamins you take with your health care professional. See your dentist regularly. Brush and floss your teeth as directed. Before you have any dental work done, tell your dentist you are   receiving this medicine. Do not become pregnant while taking this medicine or for 5 months after stopping it. Talk with your doctor or health care professional about your birth control options while taking this medicine. Women should inform their doctor if they wish to become pregnant or think they might be pregnant. There is a potential for serious side  effects to an unborn child. Talk to your health care professional or pharmacist for more information. What side effects may I notice from receiving this medicine? Side effects that you should report to your doctor or health care professional as soon as possible:  allergic reactions like skin rash, itching or hives, swelling of the face, lips, or tongue  bone pain  breathing problems  dizziness  jaw pain, especially after dental work  redness, blistering, peeling of the skin  signs and symptoms of infection like fever or chills; cough; sore throat; pain or trouble passing urine  signs of low calcium like fast heartbeat, muscle cramps or muscle pain; pain, tingling, numbness in the hands or feet; seizures  unusual bleeding or bruising  unusually weak or tired Side effects that usually do not require medical attention (report to your doctor or health care professional if they continue or are bothersome):  constipation  diarrhea  headache  joint pain  loss of appetite  muscle pain  runny nose  tiredness  upset stomach This list may not describe all possible side effects. Call your doctor for medical advice about side effects. You may report side effects to FDA at 1-800-FDA-1088. Where should I keep my medicine? This medicine is only given in a clinic, doctor's office, or other health care setting and will not be stored at home. NOTE: This sheet is a summary. It may not cover all possible information. If you have questions about this medicine, talk to your doctor, pharmacist, or health care provider.  2020 Elsevier/Gold Standard (2018-03-29 16:10:44) Fulvestrant injection What is this medicine? FULVESTRANT (ful VES trant) blocks the effects of estrogen. It is used to treat breast cancer. This medicine may be used for other purposes; ask your health care provider or pharmacist if you have questions. COMMON BRAND NAME(S): FASLODEX What should I tell my health care  provider before I take this medicine? They need to know if you have any of these conditions:  bleeding disorders  liver disease  low blood counts, like low white cell, platelet, or red cell counts  an unusual or allergic reaction to fulvestrant, other medicines, foods, dyes, or preservatives  pregnant or trying to get pregnant  breast-feeding How should I use this medicine? This medicine is for injection into a muscle. It is usually given by a health care professional in a hospital or clinic setting. Talk to your pediatrician regarding the use of this medicine in children. Special care may be needed. Overdosage: If you think you have taken too much of this medicine contact a poison control center or emergency room at once. NOTE: This medicine is only for you. Do not share this medicine with others. What if I miss a dose? It is important not to miss your dose. Call your doctor or health care professional if you are unable to keep an appointment. What may interact with this medicine?  medicines that treat or prevent blood clots like warfarin, enoxaparin, dalteparin, apixaban, dabigatran, and rivaroxaban This list may not describe all possible interactions. Give your health care provider a list of all the medicines, herbs, non-prescription drugs, or dietary supplements you use.   Also tell them if you smoke, drink alcohol, or use illegal drugs. Some items may interact with your medicine. What should I watch for while using this medicine? Your condition will be monitored carefully while you are receiving this medicine. You will need important blood work done while you are taking this medicine. Do not become pregnant while taking this medicine or for at least 1 year after stopping it. Women of child-bearing potential will need to have a negative pregnancy test before starting this medicine. Women should inform their doctor if they wish to become pregnant or think they might be pregnant. There is  a potential for serious side effects to an unborn child. Men should inform their doctors if they wish to father a child. This medicine may lower sperm counts. Talk to your health care professional or pharmacist for more information. Do not breast-feed an infant while taking this medicine or for 1 year after the last dose. What side effects may I notice from receiving this medicine? Side effects that you should report to your doctor or health care professional as soon as possible:  allergic reactions like skin rash, itching or hives, swelling of the face, lips, or tongue  feeling faint or lightheaded, falls  pain, tingling, numbness, or weakness in the legs  signs and symptoms of infection like fever or chills; cough; flu-like symptoms; sore throat  vaginal bleeding Side effects that usually do not require medical attention (report to your doctor or health care professional if they continue or are bothersome):  aches, pains  constipation  diarrhea  headache  hot flashes  nausea, vomiting  pain at site where injected  stomach pain This list may not describe all possible side effects. Call your doctor for medical advice about side effects. You may report side effects to FDA at 1-800-FDA-1088. Where should I keep my medicine? This drug is given in a hospital or clinic and will not be stored at home. NOTE: This sheet is a summary. It may not cover all possible information. If you have questions about this medicine, talk to your doctor, pharmacist, or health care provider.  2020 Elsevier/Gold Standard (2018-02-28 11:34:41)  

## 2020-11-30 LAB — CANCER ANTIGEN 27.29: CA 27.29: 127.3 U/mL — ABNORMAL HIGH (ref 0.0–38.6)

## 2020-12-01 ENCOUNTER — Ambulatory Visit: Payer: Medicaid Other

## 2020-12-02 ENCOUNTER — Ambulatory Visit: Payer: Medicaid Other

## 2020-12-02 ENCOUNTER — Other Ambulatory Visit: Payer: Medicaid Other

## 2020-12-06 ENCOUNTER — Telehealth: Payer: Self-pay | Admitting: *Deleted

## 2020-12-06 NOTE — Telephone Encounter (Signed)
Pt states that she has the "terrible mouth ulcers and entire mouth hurts. I stopped taking the medicine (Palbociclib) 3 days ago.  I cannot eat, drink or talk. I am not going through this again."  Has been using a mouth rinse. States MMW does not help.

## 2020-12-07 NOTE — Telephone Encounter (Signed)
Patient needs to come in for IV fluids

## 2020-12-08 ENCOUNTER — Ambulatory Visit: Payer: Medicare Other | Attending: Adult Health

## 2020-12-08 ENCOUNTER — Other Ambulatory Visit: Payer: Medicaid Other

## 2020-12-08 ENCOUNTER — Ambulatory Visit: Payer: Medicaid Other

## 2020-12-08 ENCOUNTER — Ambulatory Visit: Payer: Medicaid Other | Admitting: Oncology

## 2020-12-08 ENCOUNTER — Telehealth: Payer: Self-pay | Admitting: *Deleted

## 2020-12-08 ENCOUNTER — Other Ambulatory Visit: Payer: Self-pay

## 2020-12-08 DIAGNOSIS — M6281 Muscle weakness (generalized): Secondary | ICD-10-CM | POA: Insufficient documentation

## 2020-12-08 DIAGNOSIS — I890 Lymphedema, not elsewhere classified: Secondary | ICD-10-CM | POA: Diagnosis present

## 2020-12-08 DIAGNOSIS — R293 Abnormal posture: Secondary | ICD-10-CM | POA: Insufficient documentation

## 2020-12-08 DIAGNOSIS — M25611 Stiffness of right shoulder, not elsewhere classified: Secondary | ICD-10-CM | POA: Diagnosis present

## 2020-12-08 DIAGNOSIS — M79601 Pain in right arm: Secondary | ICD-10-CM | POA: Diagnosis present

## 2020-12-08 DIAGNOSIS — R278 Other lack of coordination: Secondary | ICD-10-CM | POA: Insufficient documentation

## 2020-12-08 DIAGNOSIS — M62838 Other muscle spasm: Secondary | ICD-10-CM | POA: Diagnosis present

## 2020-12-08 DIAGNOSIS — M25511 Pain in right shoulder: Secondary | ICD-10-CM | POA: Diagnosis present

## 2020-12-08 DIAGNOSIS — C773 Secondary and unspecified malignant neoplasm of axilla and upper limb lymph nodes: Secondary | ICD-10-CM | POA: Diagnosis present

## 2020-12-08 DIAGNOSIS — C50911 Malignant neoplasm of unspecified site of right female breast: Secondary | ICD-10-CM | POA: Insufficient documentation

## 2020-12-08 NOTE — Telephone Encounter (Signed)
Porsche, See if she can come in today for fluids now or anytime before 1545.  Thanks! Shawna Orleans

## 2020-12-08 NOTE — Telephone Encounter (Signed)
Per Charlie Pitter, called pt to set up appt for IV fluids. Pt stated she felt better and didn't need to come in. Advised if anything changes to call office. Pt verbalized understanding.

## 2020-12-08 NOTE — Therapy (Signed)
[facility] [address] [place], [place], [address] Phone: [phone]   Fax:  [phone]  Physical Therapy Treatment  Patient Details  Name: April [name] MRN: [id number] Date of Birth: 06-Oct-1961 Referring Provider (PT): Causey   Encounter Date: 12/08/2020   PT End of Session - 12/08/20 1451    Visit Number 16    Number of Visits 25    Date for PT Re-Evaluation 12/16/20    Authorization Type Medicaid healthy blue    Authorization - Visit Number 15    Authorization - Number of Visits 27    PT Start Time 1402    PT Stop Time 1457    PT Time Calculation (min) 55 min    Activity Tolerance Patient tolerated treatment well    Behavior During Therapy [facility] for tasks assessed/performed           Past Medical History:  Diagnosis Date  . Allergy 07/28/2012   Seasonal/Environmental allergies  . Anxiety 2013   Since 2013  . Arthritis 2014 per patient    knees and shoulders  . Bilateral ankle fractures 07/2015   Booted and resolved   . Cancer [facility]) dx June 22, 2016   right breast  . Depression 2013   Multiple  episodes  in past.  . Elevated cholesterol 2017  . Fibromyalgia 2013   diagnosed by Dr. Corliss Skains  . Genital herpes 2005   Has outbreaks monthly if not on preventative medication  . GERD (gastroesophageal reflux disease) 2013  . History of radiation therapy 02/07/17- 03/21/17   Right Breast- 4 field 25 fractions. 50 Gy to SCLV/PAB in 25 fractions. Right Breast Boost 10 gy in 5 fractions.  . Migraine 2013   migraines  . Neuromuscular disorder (HCC) 03/20/2017   neuropathy in fingers and toes from Chemo--intermittent  . Obesity   . Osteoporosis 03/23/2017   noted per bone density scan  . Peripheral neuropathy 08/13/2017  . Personal history of chemotherapy 11/2016  . Personal history of radiation therapy    4/18  . Right wrist fracture 06/2015   Resolved  . Scoliosis of thoracic spine 01/04/2012  . Skin  condition 2012   patient reports periodic episodes of severe itching. She will itch and then blister at areas including her arms, back, and buttocks.   . Urinary, incontinence, stress female 07/14/2016   patient reported    Past Surgical History:  Procedure Laterality Date  . AXILLARY LYMPH NODE DISSECTION Right 12/26/2016   Procedure: RIGHT AXILLARY LYMPH NODE DISSECTION;  Surgeon: Glenna Fellows, MD;  Location: [facility];  Service: General;  Laterality: Right;  . BREAST LUMPECTOMY Right 2018  . BREAST LUMPECTOMY WITH NEEDLE LOCALIZATION Right 12/19/2016   Procedure: RIGHT BREAST NEEDLE LOCALIZED LUMPECTOMY, RIGHT RADIOACTIVE SEED TARGETED AXILLARY SENTINEL LYMPH NODE BIOPSY;  Surgeon: Glenna Fellows, MD;  Location: [facility];  Service: General;  Laterality: Right;  . IR GENERIC HISTORICAL  10/09/2016   IR CV LINE INJECTION 10/09/2016 Irish Lack, MD [facility]INTER[facility]  . LAPAROSCOPIC APPENDECTOMY N/A 11/28/2018   Procedure: APPENDECTOMY LAPAROSCOPIC;  Surgeon: Berna Bue, MD;  Location: [facility];  Service: General;  Laterality: N/A;  . PORT-A-CATH REMOVAL Left 12/19/2016   Procedure: REMOVAL PORT-A-CATH;  Surgeon: Glenna Fellows, MD;  Location:  SURGERY CENTER;  Service: General;  Laterality: Left;  . PORTACATH PLACEMENT N/A 07/11/2016   Procedure: INSERTION PORT-A-CATH;  Surgeon: Glenna Fellows, MD;  Location: [facility];  Service: General;  Laterality:  N/A;  . RADIOACTIVE SEED GUIDED AXILLARY SENTINEL LYMPH NODE Right 12/19/2016   Procedure: RADIOACTIVE SEED GUIDED AXILLARY SENTINEL LYMPH NODE BIOPSY;  Surgeon: Excell Seltzer, MD;  Location: [place];  Service: General;  Laterality: Right;  . WISDOM TOOTH EXTRACTION  yrs ago    There were no vitals filed for this visit.   Subjective Assessment - 12/08/20 1404    Subjective I havent felt well at all.  I have the mouth sores back again from my chemo pill and I have been so  uncomfortable.  I was notified that the pump is being sent, but they didn't get me the tracking number.  I didn't want to cancel again because I have missed several appts.    Pertinent History R breast cancer, R lumpectomy in 01-04-17 with SLNB (3 and some were positive), then had ALNB (20 some were postive), pt completed chemo and radiation, then pt had recurrence in May 2021 and is stage IV, it has spead to lymph nodes and lung    Patient Stated Goals to decrease swelling in arm    Currently in Pain? Yes    Pain Score 8     Pain Location Mouth    Pain Descriptors / Indicators Radiating;Tingling;Throbbing;Numbness    Pain Type Acute pain    Pain Onset In the past 7 days    Pain Frequency Constant    Aggravating Factors  talking, eating                     Flowsheet Row Outpatient Rehab from 09/08/2020 in [place]  Lymphedema Life Impact Scale Total Score 57.35 %            [facility] Adult PT Treatment/Exercise - 12/08/20 0001      Shoulder Exercises: Supine   Horizontal ABduction Strengthening;Both;12 reps    Theraband Level (Shoulder Horizontal ABduction) Level 1 (Yellow)    External Rotation Strengthening;Both;12 reps    Theraband Level (Shoulder External Rotation) Level 1 (Yellow)    Flexion Both;10 reps    Theraband Level (Shoulder Flexion) Level 1 (Yellow)    Diagonals Strengthening;Right;Left;12 reps    Theraband Level (Shoulder Diagonals) Level 1 (Yellow)      Manual Therapy   Manual Lymphatic Drainage (MLD) short neck, left axillary LN and right inguinal LN, anterior interaxillary anastomosis, right axillo-inguinal pathway, R breast, right UE starting proximally and working distally to hand, retracing all steps    Passive ROM PROM Right shoulder in all directions-                       PT Long Term Goals - 11/18/20 1637      PT LONG TERM GOAL #1   Title Pt will obtain appropriate compression garments for long  term management of lymphedmea.    Baseline achieved sleeve and compression bra    Time 4    Period Weeks    Status Achieved      PT LONG TERM GOAL #2   Title Pt will demonstrate 120 degrees of left shoulder flexion to allow her to reach overhead.    Time 4    Status Achieved      PT LONG TERM GOAL #3   Title Pt will demonstrate 90 degrees of right shoulder abduction to allow her to reach out to the side.    Time 4    Period Weeks    Status Achieved  PT LONG TERM GOAL #4   Title Pt will be independent with a home exercise program for continued strengthening and stretching.    Time 4    Period Weeks    Status Achieved      PT LONG TERM GOAL #5   Title Pt will report a 50% improvement in R axillary pain to allow improved comfort.    Time 4    Period Weeks    Status Achieved      PT LONG TERM GOAL #6   Title Pt will have left shoulder flex 145 degrees for improved reaching ability    Period Weeks    Status Achieved      PT LONG TERM GOAL #7   Title Pt. is independent in self management of Lymphedema    Baseline pt awaiting flexi touch, and will try to wear compression sleeve greater than 2 hours at a time to keep swelling down.    Time 4    Period Weeks    Status On-going                 Plan - 12/08/20 1454    Clinical Impression Statement Pt reports that pump is on the way to her home.  She has been feeling really rough since her last chemo pill and all the ulcers in her mouth.  She has had difficulty eating and talking with pain 8/10. She has not been wearing her sleeve but has been encouraged to do so when she feels better. She was glad she came and was feeling better.   Personal Factors and Comorbidities Fitness;Time since onset of injury/illness/exacerbation;Comorbidity 2    Comorbidities scoliosis, previous hx of breast cancer with radiation    Examination-Activity Limitations Reach Overhead;Carry;Lift    Examination-Participation Restrictions  Cleaning;Shop;Community Activity;Laundry    Stability/Clinical Decision Making Evolving/Moderate complexity    Rehab Potential Good    PT Frequency 2x / week    PT Treatment/Interventions ADLs/Self Care Home Management;Therapeutic exercise;Patient/family education;Manual techniques;Manual lymph drainage;Compression bandaging;Scar mobilization;Passive range of motion;Taping;Vasopneumatic Device    PT Next Visit Plan Cont STW, PROM especially abd, MLD, encourage to wear sleeve longer periods of time during the day to prevent increased swelling.  Continue until flexitouch recieved    PT Home Exercise Plan Supine scapular series    Consulted and Agree with Plan of Care Patient           Patient will benefit from skilled therapeutic intervention in order to improve the following deficits and impairments:  Pain,Postural dysfunction,Impaired UE functional use,Increased fascial restricitons,Decreased strength,Decreased knowledge of use of DME,Decreased knowledge of precautions,Decreased range of motion,Increased edema  Visit Diagnosis: Stiffness of right shoulder, not elsewhere classified  Lymphedema, not elsewhere classified  Abnormal posture  Pain in right arm  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 Stomatitis 08/10/2020  . Varicose veins of bilateral lower extremities with other complications 0000000  . Dry skin dermatitis 07/27/2019  . Appendicitis 11/28/2018  . De Quervain's disease (tenosynovitis) 05/15/2018  . Bilateral carpal tunnel syndrome 05/15/2018  . 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 Peripheral neuropathy 08/13/2017  . Osteoporosis 03/23/2017  . Breast cancer metastasized to axillary lymph node ([place]) 06/21/2016  . Malignant  neoplasm of upper-outer quadrant of right breast  in female, estrogen receptor positive ([place]) 06/21/2016  . Elevated blood pressure reading without diagnosis of hypertension 04/28/2016  . Herpes genitalis--since 2005 per patient   . Environmental and seasonal allergies 07/28/2012  . Fibromyalgia   . Anxiety 02/07/2012  . Depression (emotion) 01/04/2012  . Headache-migranes 01/04/2012    Claris Pong 12/08/2020, 3:00 PM  [place], [place], [address] Phone: [phone]   Fax:  [phone]  Name: April [name] MRN: [id number] Date of Birth: 08-22-1961  Cheral Almas, PT 12/08/20 3:01 PM

## 2020-12-09 NOTE — Telephone Encounter (Signed)
Please remind her to not start her meds until we see hre

## 2020-12-09 NOTE — Telephone Encounter (Signed)
**  by meds I mean, she should not restart her Palbociclib/Ibrance**

## 2020-12-17 NOTE — Telephone Encounter (Signed)
No entry 

## 2020-12-20 ENCOUNTER — Ambulatory Visit: Payer: Medicare Other

## 2020-12-22 ENCOUNTER — Ambulatory Visit: Payer: Medicare Other
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
## 2020-12-27 ENCOUNTER — Other Ambulatory Visit: Payer: Self-pay

## 2020-12-27 ENCOUNTER — Encounter: Payer: Self-pay | Admitting: Medical Oncology

## 2020-12-27 ENCOUNTER — Inpatient Hospital Stay: Payer: Medicare Other

## 2020-12-27 ENCOUNTER — Telehealth: Payer: Self-pay | Admitting: *Deleted

## 2020-12-27 ENCOUNTER — Inpatient Hospital Stay (HOSPITAL_BASED_OUTPATIENT_CLINIC_OR_DEPARTMENT_OTHER): Payer: Medicare Other | Admitting: Oncology

## 2020-12-27 ENCOUNTER — Inpatient Hospital Stay: Payer: Medicare Other | Attending: Oncology

## 2020-12-27 VITALS — BP 116/88 | HR 92 | Temp 97.7°F | Resp 18 | Ht 64.0 in | Wt 214.8 lb

## 2020-12-27 DIAGNOSIS — C50919 Malignant neoplasm of unspecified site of unspecified female breast: Secondary | ICD-10-CM

## 2020-12-27 DIAGNOSIS — C50411 Malignant neoplasm of upper-outer quadrant of right female breast: Secondary | ICD-10-CM | POA: Diagnosis present

## 2020-12-27 DIAGNOSIS — Z923 Personal history of irradiation: Secondary | ICD-10-CM | POA: Insufficient documentation

## 2020-12-27 DIAGNOSIS — Z170 Estrogen receptor positive status [ER+]: Secondary | ICD-10-CM

## 2020-12-27 DIAGNOSIS — M810 Age-related osteoporosis without current pathological fracture: Secondary | ICD-10-CM

## 2020-12-27 DIAGNOSIS — G6289 Other specified polyneuropathies: Secondary | ICD-10-CM

## 2020-12-27 DIAGNOSIS — Z79899 Other long term (current) drug therapy: Secondary | ICD-10-CM | POA: Insufficient documentation

## 2020-12-27 DIAGNOSIS — M797 Fibromyalgia: Secondary | ICD-10-CM

## 2020-12-27 DIAGNOSIS — T451X5A Adverse effect of antineoplastic and immunosuppressive drugs, initial encounter: Secondary | ICD-10-CM

## 2020-12-27 DIAGNOSIS — C773 Secondary and unspecified malignant neoplasm of axilla and upper limb lymph nodes: Secondary | ICD-10-CM | POA: Diagnosis not present

## 2020-12-27 DIAGNOSIS — Z9221 Personal history of antineoplastic chemotherapy: Secondary | ICD-10-CM | POA: Insufficient documentation

## 2020-12-27 DIAGNOSIS — Z87891 Personal history of nicotine dependence: Secondary | ICD-10-CM | POA: Insufficient documentation

## 2020-12-27 DIAGNOSIS — Z79811 Long term (current) use of aromatase inhibitors: Secondary | ICD-10-CM | POA: Insufficient documentation

## 2020-12-27 DIAGNOSIS — M818 Other osteoporosis without current pathological fracture: Secondary | ICD-10-CM

## 2020-12-27 DIAGNOSIS — G620 Drug-induced polyneuropathy: Secondary | ICD-10-CM

## 2020-12-27 LAB — COMPREHENSIVE METABOLIC PANEL
ALT: 15 U/L (ref 0–44)
AST: 23 U/L (ref 15–41)
Albumin: 3.7 g/dL (ref 3.5–5.0)
Alkaline Phosphatase: 72 U/L (ref 38–126)
Anion gap: 9 (ref 5–15)
BUN: 10 mg/dL (ref 6–20)
CO2: 25 mmol/L (ref 22–32)
Calcium: 9.6 mg/dL (ref 8.9–10.3)
Chloride: 106 mmol/L (ref 98–111)
Creatinine, Ser: 1.05 mg/dL — ABNORMAL HIGH (ref 0.44–1.00)
GFR, Estimated: >60 mL/min (ref >60)
Glucose, Bld: 88 mg/dL (ref 70–99)
Potassium: 4 mmol/L (ref 3.5–5.1)
Sodium: 140 mmol/L (ref 135–145)
Total Bilirubin: 0.4 mg/dL (ref 0.3–1.2)
Total Protein: 7.2 g/dL (ref 6.5–8.1)

## 2020-12-27 LAB — CBC WITH DIFFERENTIAL/PLATELET
Abs Immature Granulocytes: 0 10*3/uL (ref 0.00–0.07)
Basophils Absolute: 0 10*3/uL (ref 0.0–0.1)
Basophils Relative: 1 %
Eosinophils Absolute: 0.2 10*3/uL (ref 0.0–0.5)
Eosinophils Relative: 6 %
HCT: 37.6 % (ref 36.0–46.0)
Hemoglobin: 12.8 g/dL (ref 12.0–15.0)
Immature Granulocytes: 0 %
Lymphocytes Relative: 25 %
Lymphs Abs: 0.7 10*3/uL (ref 0.7–4.0)
MCH: 35.9 pg — ABNORMAL HIGH (ref 26.0–34.0)
MCHC: 34 g/dL (ref 30.0–36.0)
MCV: 105.3 fL — ABNORMAL HIGH (ref 80.0–100.0)
Monocytes Absolute: 0.2 10*3/uL (ref 0.1–1.0)
Monocytes Relative: 8 %
Neutro Abs: 1.7 10*3/uL (ref 1.7–7.7)
Neutrophils Relative %: 60 %
Platelets: 368 10*3/uL (ref 150–400)
RBC: 3.57 MIL/uL — ABNORMAL LOW (ref 3.87–5.11)
RDW: 12.8 % (ref 11.5–15.5)
WBC: 2.8 10*3/uL — ABNORMAL LOW (ref 4.0–10.5)
nRBC: 0 % (ref 0.0–0.2)

## 2020-12-27 MED ORDER — VALACYCLOVIR HCL 1 G PO TABS: 1000.0000 mg | ORAL_TABLET | Freq: Two times a day (BID) | ORAL | 4 refills | Status: DC

## 2020-12-27 MED ORDER — DENOSUMAB 60 MG/ML ~~LOC~~ SOSY
60.0000 mg | PREFILLED_SYRINGE | Freq: Once | SUBCUTANEOUS | Status: AC
  Administered 2020-12-27: 60 mg via SUBCUTANEOUS

## 2020-12-27 MED ORDER — DENOSUMAB 60 MG/ML ~~LOC~~ SOSY
PREFILLED_SYRINGE | SUBCUTANEOUS | Status: AC
  Filled 2020-12-27: qty 1

## 2020-12-27 MED ORDER — FULVESTRANT 250 MG/5ML IM SOLN
INTRAMUSCULAR | Status: AC
  Filled 2020-12-27: qty 5

## 2020-12-27 MED ORDER — FULVESTRANT 250 MG/5ML IM SOLN
500.0000 mg | INTRAMUSCULAR | Status: DC
  Administered 2020-12-27: 500 mg via INTRAMUSCULAR

## 2020-12-27 NOTE — Progress Notes (Signed)
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 Jana Hakim, for discussion of new study consent form and re-consenting her to AFT ICF Version 8, dated 01/21/2020. Patient and I reviewed the consent form, page by page with all the changes/updates, which include new information regarding COVID-19 and the questionnaires, the End of Treatment Phase and Follow-up Phase, Other information that will be collected about her, and the blood sample collection with 7 and 10 years added. As we reviewed each page for new information, patient initialed and dated where indicated. Patient denied having any questions and proceeded to sign and date the consent form AFT ICF Version 8, dated 01/21/2020. Patient also consented to having her residual specimens kept for future research use. Patient was provided a copy of her signed Version 8 consent for her records. Patient was thanked for her time today and her continued support of study.  Patient was provided with my contact information for questions.  Time spent re-consenting patient, approximately 15 minutes. Maxwell Marion, RN, BSN, [facility] Clinical Research 12/27/2020 3:41 PM

## 2020-12-27 NOTE — Patient Instructions (Signed)
Fulvestrant injection What is this medicine? FULVESTRANT (ful VES trant) blocks the effects of estrogen. It is used to treat breast cancer. This medicine may be used for other purposes; ask your health care provider or pharmacist if you have questions. COMMON BRAND NAME(S): FASLODEX What should I tell my health care provider before I take this medicine? They need to know if you have any of these conditions:  bleeding disorders  liver disease  low blood counts, like low white cell, platelet, or red cell counts  an unusual or allergic reaction to fulvestrant, other medicines, foods, dyes, or preservatives  pregnant or trying to get pregnant  breast-feeding How should I use this medicine? This medicine is for injection into a muscle. It is usually given by a health care professional in a hospital or clinic setting. Talk to your pediatrician regarding the use of this medicine in children. Special care may be needed. Overdosage: If you think you have taken too much of this medicine contact a poison control center or emergency room at once. NOTE: This medicine is only for you. Do not share this medicine with others. What if I miss a dose? It is important not to miss your dose. Call your doctor or health care professional if you are unable to keep an appointment. What may interact with this medicine?  medicines that treat or prevent blood clots like warfarin, enoxaparin, dalteparin, apixaban, dabigatran, and rivaroxaban This list may not describe all possible interactions. Give your health care provider a list of all the medicines, herbs, non-prescription drugs, or dietary supplements you use. Also tell them if you smoke, drink alcohol, or use illegal drugs. Some items may interact with your medicine. What should I watch for while using this medicine? Your condition will be monitored carefully while you are receiving this medicine. You will need important blood work done while you are taking  this medicine. Do not become pregnant while taking this medicine or for at least 1 year after stopping it. Women of child-bearing potential will need to have a negative pregnancy test before starting this medicine. Women should inform their doctor if they wish to become pregnant or think they might be pregnant. There is a potential for serious side effects to an unborn child. Men should inform their doctors if they wish to father a child. This medicine may lower sperm counts. Talk to your health care professional or pharmacist for more information. Do not breast-feed an infant while taking this medicine or for 1 year after the last dose. What side effects may I notice from receiving this medicine? Side effects that you should report to your doctor or health care professional as soon as possible:  allergic reactions like skin rash, itching or hives, swelling of the face, lips, or tongue  feeling faint or lightheaded, falls  pain, tingling, numbness, or weakness in the legs  signs and symptoms of infection like fever or chills; cough; flu-like symptoms; sore throat  vaginal bleeding Side effects that usually do not require medical attention (report to your doctor or health care professional if they continue or are bothersome):  aches, pains  constipation  diarrhea  headache  hot flashes  nausea, vomiting  pain at site where injected  stomach pain This list may not describe all possible side effects. Call your doctor for medical advice about side effects. You may report side effects to FDA at 1-800-FDA-1088. Where should I keep my medicine? This drug is given in a hospital or clinic and will  not be stored at home. NOTE: This sheet is a summary. It may not cover all possible information. If you have questions about this medicine, talk to your doctor, pharmacist, or health care provider.  2021 Elsevier/Gold Standard (2018-02-28 11:34:41) Denosumab injection What is this  medicine? DENOSUMAB (den oh sue mab) slows bone breakdown. Prolia is used to treat osteoporosis in women after menopause and in men, and in people who are taking corticosteroids for 6 months or more. Delton See is used to treat a high calcium level due to cancer and to prevent bone fractures and other bone problems caused by multiple myeloma or cancer bone metastases. Delton See is also used to treat giant cell tumor of the bone. This medicine may be used for other purposes; ask your health care provider or pharmacist if you have questions. COMMON BRAND NAME(S): Prolia, XGEVA What should I tell my health care provider before I take this medicine? They need to know if you have any of these conditions:  dental disease  having surgery or tooth extraction  infection  kidney disease  low levels of calcium or Vitamin D in the blood  malnutrition  on hemodialysis  skin conditions or sensitivity  thyroid or parathyroid disease  an unusual reaction to denosumab, other medicines, foods, dyes, or preservatives  pregnant or trying to get pregnant  breast-feeding How should I use this medicine? This medicine is for injection under the skin. It is given by a health care professional in a hospital or clinic setting. A special MedGuide will be given to you before each treatment. Be sure to read this information carefully each time. For Prolia, talk to your pediatrician regarding the use of this medicine in children. Special care may be needed. For Delton See, talk to your pediatrician regarding the use of this medicine in children. While this drug may be prescribed for children as young as 13 years for selected conditions, precautions do apply. Overdosage: If you think you have taken too much of this medicine contact a poison control center or emergency room at once. NOTE: This medicine is only for you. Do not share this medicine with others. What if I miss a dose? It is important not to miss your dose. Call  your doctor or health care professional if you are unable to keep an appointment. What may interact with this medicine? Do not take this medicine with any of the following medications:  other medicines containing denosumab This medicine may also interact with the following medications:  medicines that lower your chance of fighting infection  steroid medicines like prednisone or cortisone This list may not describe all possible interactions. Give your health care provider a list of all the medicines, herbs, non-prescription drugs, or dietary supplements you use. Also tell them if you smoke, drink alcohol, or use illegal drugs. Some items may interact with your medicine. What should I watch for while using this medicine? Visit your doctor or health care professional for regular checks on your progress. Your doctor or health care professional may order blood tests and other tests to see how you are doing. Call your doctor or health care professional for advice if you get a fever, chills or sore throat, or other symptoms of a cold or flu. Do not treat yourself. This drug may decrease your body's ability to fight infection. Try to avoid being around people who are sick. You should make sure you get enough calcium and vitamin D while you are taking this medicine, unless your doctor tells  you not to. Discuss the foods you eat and the vitamins you take with your health care professional. See your dentist regularly. Brush and floss your teeth as directed. Before you have any dental work done, tell your dentist you are receiving this medicine. Do not become pregnant while taking this medicine or for 5 months after stopping it. Talk with your doctor or health care professional about your birth control options while taking this medicine. Women should inform their doctor if they wish to become pregnant or think they might be pregnant. There is a potential for serious side effects to an unborn child. Talk to your  health care professional or pharmacist for more information. What side effects may I notice from receiving this medicine? Side effects that you should report to your doctor or health care professional as soon as possible:  allergic reactions like skin rash, itching or hives, swelling of the face, lips, or tongue  bone pain  breathing problems  dizziness  jaw pain, especially after dental work  redness, blistering, peeling of the skin  signs and symptoms of infection like fever or chills; cough; sore throat; pain or trouble passing urine  signs of low calcium like fast heartbeat, muscle cramps or muscle pain; pain, tingling, numbness in the hands or feet; seizures  unusual bleeding or bruising  unusually weak or tired Side effects that usually do not require medical attention (report to your doctor or health care professional if they continue or are bothersome):  constipation  diarrhea  headache  joint pain  loss of appetite  muscle pain  runny nose  tiredness  upset stomach This list may not describe all possible side effects. Call your doctor for medical advice about side effects. You may report side effects to FDA at 1-800-FDA-1088. Where should I keep my medicine? This medicine is only given in a clinic, doctor's office, or other health care setting and will not be stored at home. NOTE: This sheet is a summary. It may not cover all possible information. If you have questions about this medicine, talk to your doctor, pharmacist, or health care provider.  2021 Elsevier/Gold Standard (2018-03-29 16:10:44)  

## 2020-12-27 NOTE — Telephone Encounter (Addendum)
[name];  LVM for patient informing her there is a new consent form for the study and that a research nurse will meet her today before her appointment with Dr. Jana Hakim to review and sign updated consent form for [name] study.  Asked patient to call back to discuss the details if she has any questions before her appointment. Thanked patient for her time and ongoing support of this study.  Foye Spurling, BSN, RN Clinical Research Nurse 12/27/2020 10:43 AM   Patient returned call and this nurse reviewed pertinent changes to consent form including collection of blood samples at 5, 7 and 10 years on study. Also informed patient of Covid19 related data being collected including covid test results and vaccination status. Patient verbalized understanding and states she wants to continue on study and agrees to meet with research nurse to review and sign new consent in clinic today before her other appointments. Thanked patient again and informed her another Nutritional therapist, Rubin Payor, will meet with her while she is waiting for other appointments. Patient verbalized understanding.  Foye Spurling, BSN, RN Clinical Research Nurse 12/27/2020 11:32 AM

## 2020-12-28 ENCOUNTER — Ambulatory Visit: Payer: Medicare Other | Admitting: Physical Therapy

## 2020-12-28 ENCOUNTER — Encounter: Payer: Self-pay | Admitting: Physical Therapy

## 2020-12-28 DIAGNOSIS — I890 Lymphedema, not elsewhere classified: Secondary | ICD-10-CM

## 2020-12-28 DIAGNOSIS — C773 Secondary and unspecified malignant neoplasm of axilla and upper limb lymph nodes: Secondary | ICD-10-CM

## 2020-12-28 DIAGNOSIS — M25611 Stiffness of right shoulder, not elsewhere classified: Secondary | ICD-10-CM

## 2020-12-28 DIAGNOSIS — M79601 Pain in right arm: Secondary | ICD-10-CM

## 2020-12-28 DIAGNOSIS — C50911 Malignant neoplasm of unspecified site of right female breast: Secondary | ICD-10-CM

## 2020-12-28 DIAGNOSIS — R293 Abnormal posture: Secondary | ICD-10-CM

## 2020-12-28 LAB — CANCER ANTIGEN 27.29: CA 27.29: 135.2 U/mL — ABNORMAL HIGH (ref 0.0–38.6)

## 2020-12-28 NOTE — Addendum Note (Signed)
Addended by: Wynelle Beckmann, Hilaria Ota L on: 12/28/2020 10:06 AM   Modules accepted: Orders

## 2020-12-28 NOTE — Therapy (Signed)
[place], [place], [address] Phone: [phone]   Fax:  [phone]  Physical Therapy Treatment  Patient Details  Name: April [name] MRN: [id number] Date of Birth: Apr 25, 1961 Referring Provider (PT): Causey   Encounter Date: 12/28/2020   PT End of Session - 12/28/20 1002    Visit Number 17    Number of Visits 25    Date for PT Re-Evaluation 01/25/21    Authorization Type Medicaid healthy blue    Authorization - Visit Number 86    Authorization - Number of Visits 27    PT Start Time 0902    PT Stop Time 0958    PT Time Calculation (min) 56 min    Activity Tolerance Patient tolerated treatment well    Behavior During Therapy [facility] for tasks assessed/performed           Past Medical History:  Diagnosis Date  . Allergy 07/28/2012   Seasonal/Environmental allergies  . Anxiety 2013   Since 2013  . Arthritis 2014 per patient    knees and shoulders  . Bilateral ankle fractures 07/2015   Booted and resolved   . Cancer [facility]) dx June 22, 2016   right breast  . Depression 2013   Multiple  episodes  in past.  . Elevated cholesterol 2017  . Fibromyalgia 2013   diagnosed by Dr. [name]  . Genital herpes 2005   Has outbreaks monthly if not on preventative medication  . GERD (gastroesophageal reflux disease) 2013  . History of radiation therapy 02/07/17- 03/21/17   Right Breast- 4 field 25 fractions. 50 Gy to SCLV/PAB in 25 fractions. Right Breast Boost 10 gy in 5 fractions.  . Migraine 2013   migraines  . 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 Obesity   . Osteoporosis 03/23/2017   noted per bone density scan  . Peripheral neuropathy 08/13/2017  . Personal history of chemotherapy 11/2016  . Personal history of radiation therapy    4/18  . Right wrist fracture 06/2015   Resolved  . Scoliosis of thoracic spine 01/04/2012  . Skin  condition 2012   patient reports periodic episodes of severe itching. She will itch and then blister at areas including her arms, back, and buttocks.   . Urinary, incontinence, stress female 07/14/2016   patient reported    Past Surgical History:  Procedure Laterality Date  . AXILLARY LYMPH NODE DISSECTION Right 12/26/2016   Procedure: RIGHT AXILLARY LYMPH NODE DISSECTION;  Surgeon: Excell Seltzer, MD;  Location: [place];  Service: General;  Laterality: Right;  . BREAST LUMPECTOMY Right 2018  . BREAST LUMPECTOMY WITH NEEDLE LOCALIZATION Right 12/19/2016   Procedure: RIGHT BREAST NEEDLE LOCALIZED LUMPECTOMY, RIGHT RADIOACTIVE SEED TARGETED AXILLARY SENTINEL LYMPH NODE BIOPSY;  Surgeon: Excell Seltzer, MD;  Location: [place];  Service: General;  Laterality: Right;  . IR GENERIC HISTORICAL  10/09/2016   IR CV LINE INJECTION 10/09/2016 Aletta Edouard, MD [facility]  . LAPAROSCOPIC APPENDECTOMY N/A 11/28/2018   Procedure: APPENDECTOMY LAPAROSCOPIC;  Surgeon: [name], MD;  Location: [facility];  Service: General;  Laterality: N/A;  . PORT-A-CATH REMOVAL Left 12/19/2016   Procedure: REMOVAL PORT-A-CATH;  Surgeon: Excell Seltzer, MD;  Location: [place];  Service: General;  Laterality: Left;  . PORTACATH PLACEMENT N/A 07/11/2016   Procedure: INSERTION PORT-A-CATH;  Surgeon: Excell Seltzer, MD;  Location: [facility];  Service: General;  Laterality:  N/A;  . RADIOACTIVE SEED GUIDED AXILLARY SENTINEL LYMPH NODE Right 12/19/2016   Procedure: RADIOACTIVE SEED GUIDED AXILLARY SENTINEL LYMPH NODE BIOPSY;  Surgeon: Glenna Fellows, MD;  Location: [facility];  Service: General;  Laterality: Right;  . WISDOM TOOTH EXTRACTION  yrs ago    There were no vitals filed for this visit.   Subjective Assessment - 12/28/20 0903    Subjective I was installing vinyl flooring until 1:15 am then my upper back was hurting so bad I could not  sleep until 4am.    Pertinent History R breast cancer, R lumpectomy in 01-04-17 with SLNB (3 and some were positive), then had ALNB (20 some were postive), pt completed chemo and radiation, then pt had recurrence in May 2021 and is stage IV, it has spead to lymph nodes and lung    Patient Stated Goals to decrease swelling in arm    Currently in Pain? Yes    Pain Score 6     Pain Location Back    Pain Orientation Upper    Pain Descriptors / Indicators Aching    Pain Type Acute pain    Pain Onset Yesterday    Pain Frequency Constant                     Flowsheet Row [facility]  Lymphedema Life Impact Scale Total Score 57.35 %            [facility] Adult PT Treatment/Exercise - 12/28/20 0001      Manual Therapy   Soft tissue mobilization to R pec muscle with numerous trigger points and areas of tightness noted and to R serratus in area is discomfort    Manual Lymphatic Drainage (MLD) short neck, left axillary LN and right inguinal LN, anterior interaxillary anastomosis, right axillo-inguinal pathway, R breast, right UE starting proximally and working distally to hand, retracing all steps                       PT Long Term Goals - 12/28/20 0911      PT LONG TERM GOAL #1   Title Pt will obtain appropriate compression garments for long term management of lymphedmea.    Baseline achieved sleeve and compression bra    Time 4    Period Weeks    Status Achieved      PT LONG TERM GOAL #2   Title Pt will demonstrate 120 degrees of left shoulder flexion to allow her to reach overhead.    Baseline 95    Time 4    Period Weeks    Status Achieved      PT LONG TERM GOAL #3   Title Pt will demonstrate 90 degrees of right shoulder abduction to allow her to reach out to the side.    Baseline 62    Time 4    Period Weeks    Status Achieved      PT LONG TERM GOAL #4   Title Pt will be independent  with a home exercise program for continued strengthening and stretching.    Baseline not educated yet    Time 4    Status Achieved      PT LONG TERM GOAL #5   Title Pt will report a 50% improvement in R axillary pain to allow improved comfort.    Time 4    Period Weeks    Status Achieved  PT LONG TERM GOAL #6   Title Pt will have left shoulder flex 145 degrees for improved reaching ability    Time 4    Period Weeks    Status Achieved      PT LONG TERM GOAL #7   Title Pt. is independent in self management of Lymphedema    Baseline pt awaiting flexi touch, and will try to wear compression sleeve greater than 2 hours at a time to keep swelling down; 12/28/20- pt has received the [place] but has not opened it yet she plans on opening it this week    Time 4    Period Weeks    Status On-going                 Plan - 12/28/20 0957    Clinical Impression Statement Pt reports that she recieved her FlexiTouch but has not set it up yet. She plans on doing that this week. She is having increased breast swelling today and also increased tightness across R pec and serratus. Pt has been doing a lot of home improvement work at home using a saw and having to get in and out of the floor. Educated pt that it is imperative that she wear her compression bra and sleeve when doing this type of work so that her lymphedema does not worsen. Will taper PT down to 1x/wk for another 4 weeks. She would benefit from additional skilled PT visits to decrease discomfort across R chest and ensure that pt is able to independently manage her lymphedema through the compression pump and compression garments.    PT Frequency 2x / week    PT Duration 4 weeks    PT Treatment/Interventions ADLs/Self Care Home Management;Therapeutic exercise;Patient/family education;Manual techniques;Manual lymph drainage;Compression bandaging;Scar mobilization;Passive range of motion;Taping;Vasopneumatic Device    PT Next Visit Plan  Cont STW, PROM especially abd, MLD, encourage to wear sleeve longer periods of time during the day to prevent increased swelling.  Continue until flexitouch recieved    PT Home Exercise Plan Supine scapular series    Consulted and Agree with Plan of Care Patient           Patient will benefit from skilled therapeutic intervention in order to improve the following deficits and impairments:  Pain,Postural dysfunction,Impaired UE functional use,Increased fascial restricitons,Decreased strength,Decreased knowledge of use of DME,Decreased knowledge of precautions,Decreased range of motion,Increased edema  Visit Diagnosis: Lymphedema, not elsewhere classified  Stiffness of right shoulder, not elsewhere classified  Abnormal posture  Pain in right arm  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 Stomatitis 08/10/2020  . Varicose veins of bilateral lower extremities with other complications 62/69/4854  . Dry skin dermatitis 07/27/2019  . Appendicitis 11/28/2018  . De Quervain's disease (tenosynovitis) 05/15/2018  . Bilateral carpal tunnel syndrome 05/15/2018  . 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 Osteoporosis 03/23/2017  . Breast cancer metastasized to axillary lymph node ([place]) 06/21/2016  . Malignant neoplasm of upper-outer quadrant of right breast in female, estrogen receptor positive ([place]) 06/21/2016  . Elevated blood pressure reading without diagnosis of hypertension 04/28/2016  . Herpes genitalis--since 2005 per patient   . Environmental and seasonal allergies 07/28/2012  . Fibromyalgia   . Anxiety 02/07/2012  . Depression (emotion) 01/04/2012  . Headache-migranes 01/04/2012    Allyson Sabal [facility] 12/28/2020, 10:04 AM  [place] [phone]  [place], [place], [address] Phone: [phone]   Fax:   [phone]  Name: SAJE SWEARINGEN MRN: [id number] Date of Birth: [birth year]  Manus Gunning, PT 12/28/20 10:04 AM

## 2021-01-05 ENCOUNTER — Ambulatory Visit: Payer: Medicaid Other | Attending: Adult Health | Admitting: Physical Therapy

## 2021-01-05 ENCOUNTER — Other Ambulatory Visit: Payer: Self-pay

## 2021-01-05 ENCOUNTER — Encounter: Payer: Self-pay | Admitting: Physical Therapy

## 2021-01-05 DIAGNOSIS — R293 Abnormal posture: Secondary | ICD-10-CM | POA: Insufficient documentation

## 2021-01-05 DIAGNOSIS — I890 Lymphedema, not elsewhere classified: Secondary | ICD-10-CM | POA: Insufficient documentation

## 2021-01-05 DIAGNOSIS — M25611 Stiffness of right shoulder, not elsewhere classified: Secondary | ICD-10-CM | POA: Diagnosis present

## 2021-01-05 NOTE — Therapy (Signed)
[place], [place], [address] Phone: [phone]   Fax:  [phone]  Physical Therapy Treatment  Patient Details  Name: April [name] MRN: [id number] Date of Birth: [birth year] Referring Provider (PT): Causey   Encounter Date: 01/05/2021   PT End of Session - 01/05/21 1705    Visit Number 18    Number of Visits 25    Date for PT Re-Evaluation 01/25/21    Authorization Type Medicaid healthy blue    Authorization - Visit Number 47    Authorization - Number of Visits 27    PT Start Time [id number]    PT Stop Time 1700    PT Time Calculation (min) 56 min    Activity Tolerance Patient tolerated treatment well    Behavior During Therapy [facility] for tasks assessed/performed           Past Medical History:  Diagnosis Date  . Allergy 07/28/2012   Seasonal/Environmental allergies  . Anxiety 2013   Since 2013  . Arthritis 2014 per patient    knees and shoulders  . Bilateral ankle fractures 07/2015   Booted and resolved   . Cancer [facility]) dx June 22, 2016   right breast  . Depression 2013   Multiple  episodes  in past.  . Elevated cholesterol 2017  . Fibromyalgia 2013   diagnosed by Dr. [name]  . Genital herpes 2005   Has outbreaks monthly if not on preventative medication  . GERD (gastroesophageal reflux disease) 2013  . History of radiation therapy 02/07/17- 03/21/17   Right Breast- 4 field 25 fractions. 50 Gy to SCLV/PAB in 25 fractions. Right Breast Boost 10 gy in 5 fractions.  . Migraine 2013   migraines  . Neuromuscular disorder ([place]) 03/20/2017   neuropathy in fingers and toes from Chemo--intermittent  . Obesity   . Osteoporosis 03/23/2017   noted per bone density scan  . Peripheral neuropathy 08/13/2017  . Personal history of chemotherapy 11/2016  . Personal history of radiation therapy    4/18  . Right wrist fracture 06/2015   Resolved  . Scoliosis of thoracic spine 01/04/2012  . Skin  condition 2012   patient reports periodic episodes of severe itching. She will itch and then blister at areas including her arms, back, and buttocks.   . Urinary, incontinence, stress female 07/14/2016   patient reported    Past Surgical History:  Procedure Laterality Date  . AXILLARY LYMPH NODE DISSECTION Right 12/26/2016   Procedure: RIGHT AXILLARY LYMPH NODE DISSECTION;  Surgeon: Excell Seltzer, MD;  Location: [place];  Service: General;  Laterality: Right;  . BREAST LUMPECTOMY Right 2018  . BREAST LUMPECTOMY WITH NEEDLE LOCALIZATION Right 12/19/2016   Procedure: RIGHT BREAST NEEDLE LOCALIZED LUMPECTOMY, RIGHT RADIOACTIVE SEED TARGETED AXILLARY SENTINEL LYMPH NODE BIOPSY;  Surgeon: Excell Seltzer, MD;  Location: [place];  Service: General;  Laterality: Right;  . IR GENERIC HISTORICAL  10/09/2016   IR CV LINE INJECTION 10/09/2016 Aletta Edouard, MD [facility]INTER[facility]  . LAPAROSCOPIC APPENDECTOMY N/A 11/28/2018   Procedure: APPENDECTOMY LAPAROSCOPIC;  Surgeon: [name], MD;  Location: [place];  Service: General;  Laterality: N/A;  . PORT-A-CATH REMOVAL Left 12/19/2016   Procedure: REMOVAL PORT-A-CATH;  Surgeon: Excell Seltzer, MD;  Location: [place];  Service: General;  Laterality: Left;  . PORTACATH PLACEMENT N/A 07/11/2016   Procedure: INSERTION PORT-A-CATH;  Surgeon: Excell Seltzer, MD;  [facility];  Service: General;  Laterality:  N/A;  . RADIOACTIVE SEED GUIDED AXILLARY SENTINEL LYMPH NODE Right 12/19/2016   Procedure: RADIOACTIVE SEED GUIDED AXILLARY SENTINEL LYMPH NODE BIOPSY;  Surgeon: Excell Seltzer, MD;  Location: [place];  Service: General;  Laterality: Right;  . WISDOM TOOTH EXTRACTION  yrs ago    There were no vitals filed for this visit.   Subjective Assessment - 01/05/21 1605    Subjective I wore my sleeve today so you could see it. My thumb hurts so bad when it is on and my hand goes  numb.    Pertinent History R breast cancer, R lumpectomy in 01-04-17 with SLNB (3 and some were positive), then had ALNB (20 some were postive), pt completed chemo and radiation, then pt had recurrence in May 2021 and is stage IV, it has spead to lymph nodes and lung    Patient Stated Goals to decrease swelling in arm    Currently in Pain? Yes    Pain Score 3     Pain Location Hand    Pain Orientation Right    Pain Descriptors / Indicators Aching    Pain Type Acute pain                     Flowsheet Row Outpatient Rehab from 09/08/2020 in [place]  Lymphedema Life Impact Scale Total Score 57.35 %            [facility] Adult PT Treatment/Exercise - 01/05/21 0001      Manual Therapy   Edema Management assessed compression gauntlet for fit since pt has been having increased pain in the thumb area and reached out to DME company to see if pts insurance will cover a remeasure    Soft tissue mobilization to R pec muscle with numerous trigger points and areas of tightness noted    Manual Lymphatic Drainage (MLD) short neck, left axillary LN and right inguinal LN, anterior interaxillary anastomosis, right axillo-inguinal pathway, R breast, then retracing all steps                       PT Long Term Goals - 12/28/20 0911      PT LONG TERM GOAL #1   Title Pt will obtain appropriate compression garments for long term management of lymphedmea.    Baseline achieved sleeve and compression bra    Time 4    Period Weeks    Status Achieved      PT LONG TERM GOAL #2   Title Pt will demonstrate 120 degrees of left shoulder flexion to allow her to reach overhead.    Baseline 95    Time 4    Period Weeks    Status Achieved      PT LONG TERM GOAL #3   Title Pt will demonstrate 90 degrees of right shoulder abduction to allow her to reach out to the side.    Baseline 62    Time 4    Period Weeks    Status Achieved      PT LONG TERM  GOAL #4   Title Pt will be independent with a home exercise program for continued strengthening and stretching.    Baseline not educated yet    Time 4    Status Achieved      PT LONG TERM GOAL #5   Title Pt will report a 50% improvement in R axillary pain to allow improved comfort.    Time 4  Period Weeks    Status Achieved      PT LONG TERM GOAL #6   Title Pt will have left shoulder flex 145 degrees for improved reaching ability    Time 4    Period Weeks    Status Achieved      PT LONG TERM GOAL #7   Title Pt. is independent in self management of Lymphedema    Baseline pt awaiting flexi touch, and will try to wear compression sleeve greater than 2 hours at a time to keep swelling down; 12/28/20- pt has received the [place] but has not opened it yet she plans on opening it this week    Time 4    Period Weeks    Status On-going                 Plan - 01/05/21 1706    Clinical Impression Statement Pt has been having increased thumb pain and swelling just proximal to base of thumb when wearing the gauntlet. She has tried stretchign out the fingers but it has not helped. Reached out to [employer] about getting it remade. Also educated pt about how to purchase a compression bra extender because pt feels her compression bra is too tight and causes a rash. Continud with MLD to R breast. Her fibrosis is much improved since pt has been wearing her compression bra. Pt is currently signed up to be remeasered for a gauntlet on Friday if her insurance covers it.    PT Frequency 2x / week    PT Duration 4 weeks    PT Treatment/Interventions ADLs/Self Care Home Management;Therapeutic exercise;Patient/family education;Manual techniques;Manual lymph drainage;Compression bandaging;Scar mobilization;Passive range of motion;Taping;Vasopneumatic Device    PT Next Visit Plan Cont STW, PROM especially abd, MLD, encourage to wear sleeve longer periods of time during the day to prevent increased  swelling.  Continue until flexitouch recieved    PT Home Exercise Plan Supine scapular series    Consulted and Agree with Plan of Care Patient           Patient will benefit from skilled therapeutic intervention in order to improve the following deficits and impairments:  Pain,Postural dysfunction,Impaired UE functional use,Increased fascial restricitons,Decreased strength,Decreased knowledge of use of DME,Decreased knowledge of precautions,Decreased range of motion,Increased edema  Visit Diagnosis: Lymphedema, not elsewhere classified  Abnormal posture     Problem List Patient Active Problem List   Diagnosis Date Noted  . Stomatitis 08/10/2020  . Varicose veins of bilateral lower extremities with other complications 13/24/4010  . Dry skin dermatitis 07/27/2019  . Appendicitis 11/28/2018  . De Quervain's disease (tenosynovitis) 05/15/2018  . Bilateral carpal tunnel syndrome 05/15/2018  . Neuropathy due to chemotherapeutic drug ([place]) 02/11/2018  . Osteoporosis 03/23/2017  . Breast cancer metastasized to axillary lymph node ([place]) 06/21/2016  . Malignant neoplasm of upper-outer quadrant of right breast in female, estrogen receptor positive ([place]) 06/21/2016  . Elevated blood pressure reading without diagnosis of hypertension 04/28/2016  . Herpes genitalis--since 2005 per patient   . Environmental and seasonal allergies 07/28/2012  . Fibromyalgia   . Anxiety 02/07/2012  . Depression (emotion) 01/04/2012  . Headache-migranes 01/04/2012    Allyson Sabal [facility] 01/05/2021, 5:09 PM  [place], [place], [address] Phone: [phone]   Fax:  [phone]  Name: CHRISTIONA SIDDIQUE MRN: [id number] Date of Birth: 1961/01/17  Manus Gunning, PT 01/05/21 5:09 PM

## 2021-01-12 ENCOUNTER — Ambulatory Visit: Payer: Medicaid Other | Admitting: Physical Therapy

## 2021-01-12 ENCOUNTER — Encounter: Payer: Self-pay | Admitting: Physical Therapy

## 2021-01-12 ENCOUNTER — Other Ambulatory Visit: Payer: Self-pay

## 2021-01-12 DIAGNOSIS — I890 Lymphedema, not elsewhere classified: Secondary | ICD-10-CM

## 2021-01-12 NOTE — Therapy (Signed)
[place], [place], [address] Phone: [phone]   Fax:  [phone]  Physical Therapy Treatment  Patient Details  Name: April [name] MRN: [id number] Date of Birth: 11/25/1961 Referring Provider (PT): Causey   Encounter Date: 01/12/2021   PT End of Session - 01/12/21 1200    Visit Number 19    Number of Visits 25    Date for PT Re-Evaluation 01/25/21    Authorization Type Medicaid healthy blue    Authorization - Visit Number 18    Authorization - Number of Visits 27    PT Start Time 5277   pt arrived late   PT Stop Time 1157    PT Time Calculation (min) 46 min    Activity Tolerance Patient tolerated treatment well    Behavior During Therapy [facility] for tasks assessed/performed           Past Medical History:  Diagnosis Date  . Allergy 07/28/2012   Seasonal/Environmental allergies  . Anxiety 2013   Since 2013  . Arthritis 2014 per patient    knees and shoulders  . Bilateral ankle fractures 07/2015   Booted and resolved   . Cancer [facility]) dx June 22, 2016   right breast  . Depression 2013   Multiple  episodes  in past.  . Elevated cholesterol 2017  . Fibromyalgia 2013   diagnosed by Dr. [name]  . Genital herpes 2005   Has outbreaks monthly if not on preventative medication  . GERD (gastroesophageal reflux disease) 2013  . History of radiation therapy 02/07/17- 03/21/17   Right Breast- 4 field 25 fractions. 50 Gy to SCLV/PAB in 25 fractions. Right Breast Boost 10 gy in 5 fractions.  . Migraine 2013   migraines  . Neuromuscular disorder ([place]) 03/20/2017   neuropathy in fingers and toes from Chemo--intermittent  . Obesity   . Osteoporosis 03/23/2017   noted per bone density scan  . Peripheral neuropathy 08/13/2017  . Personal history of chemotherapy 11/2016  . Personal history of radiation therapy    4/18  . Right wrist fracture 06/2015   Resolved  . Scoliosis of thoracic spine  01/04/2012  . Skin condition 2012   patient reports periodic episodes of severe itching. She will itch and then blister at areas including her arms, back, and buttocks.   . Urinary, incontinence, stress female 07/14/2016   patient reported    Past Surgical History:  Procedure Laterality Date  . AXILLARY LYMPH NODE DISSECTION Right 12/26/2016   Procedure: RIGHT AXILLARY LYMPH NODE DISSECTION;  Surgeon: Excell Seltzer, MD;  Location: [place];  Service: General;  Laterality: Right;  . BREAST LUMPECTOMY Right 2018  . BREAST LUMPECTOMY WITH NEEDLE LOCALIZATION Right 12/19/2016   Procedure: RIGHT BREAST NEEDLE LOCALIZED LUMPECTOMY, RIGHT RADIOACTIVE SEED TARGETED AXILLARY SENTINEL LYMPH NODE BIOPSY;  Surgeon: Excell Seltzer, MD;  Location: [place];  Service: General;  Laterality: Right;  . IR GENERIC HISTORICAL  10/09/2016   IR CV LINE INJECTION 10/09/2016 Aletta Edouard, MD [facility]  . LAPAROSCOPIC APPENDECTOMY N/A 11/28/2018   Procedure: APPENDECTOMY LAPAROSCOPIC;  Surgeon: [name], MD;  Location: [place];  Service: General;  Laterality: N/A;  . PORT-A-CATH REMOVAL Left 12/19/2016   Procedure: REMOVAL PORT-A-CATH;  Surgeon: Excell Seltzer, MD;  Location: [place];  Service: General;  Laterality: Left;  . PORTACATH PLACEMENT N/A 07/11/2016   Procedure: INSERTION PORT-A-CATH;  Surgeon: Excell Seltzer, MD;  [facility];  Service: General;  Laterality: N/A;  . RADIOACTIVE SEED GUIDED AXILLARY SENTINEL LYMPH NODE Right 12/19/2016   Procedure: RADIOACTIVE SEED GUIDED AXILLARY SENTINEL LYMPH NODE BIOPSY;  Surgeon: Excell Seltzer, MD;  Location: [place];  Service: General;  Laterality: Right;  . WISDOM TOOTH EXTRACTION  yrs ago    There were no vitals filed for this visit.   Subjective Assessment - 01/12/21 1113    Subjective I got measured on Friday. My allergies are really bad right now. My breast is  still hurting from swelling.    Pertinent History R breast cancer, R lumpectomy in 01-04-17 with SLNB (3 and some were positive), then had ALNB (20 some were postive), pt completed chemo and radiation, then pt had recurrence in May 2021 and is stage IV, it has spead to lymph nodes and lung    Patient Stated Goals to decrease swelling in arm    Currently in Pain? Yes    Pain Score 4     Pain Location Breast    Pain Orientation Right    Pain Descriptors / Indicators Aching    Pain Type Acute pain    Pain Onset 1 to 4 weeks ago                     Flowsheet Row Outpatient Rehab from 09/08/2020 in [place]  Lymphedema Life Impact Scale Total Score 57.35 %            [facility] Adult PT Treatment/Exercise - 01/12/21 0001      Manual Therapy   Manual Lymphatic Drainage (MLD) short neck, left axillary LN and right inguinal LN, anterior interaxillary anastomosis, right axillo-inguinal pathway, R breast, then retracing all steps                       PT Long Term Goals - 12/28/20 0911      PT LONG TERM GOAL #1   Title Pt will obtain appropriate compression garments for long term management of lymphedmea.    Baseline achieved sleeve and compression bra    Time 4    Period Weeks    Status Achieved      PT LONG TERM GOAL #2   Title Pt will demonstrate 120 degrees of left shoulder flexion to allow her to reach overhead.    Baseline 95    Time 4    Period Weeks    Status Achieved      PT LONG TERM GOAL #3   Title Pt will demonstrate 90 degrees of right shoulder abduction to allow her to reach out to the side.    Baseline 62    Time 4    Period Weeks    Status Achieved      PT LONG TERM GOAL #4   Title Pt will be independent with a home exercise program for continued strengthening and stretching.    Baseline not educated yet    Time 4    Status Achieved      PT LONG TERM GOAL #5   Title Pt will report a 50% improvement  in R axillary pain to allow improved comfort.    Time 4    Period Weeks    Status Achieved      PT LONG TERM GOAL #6   Title Pt will have left shoulder flex 145 degrees for improved reaching ability    Time 4    Period Weeks  Status Achieved      PT LONG TERM GOAL #7   Title Pt. is independent in self management of Lymphedema    Baseline pt awaiting flexi touch, and will try to wear compression sleeve greater than 2 hours at a time to keep swelling down; 12/28/20- pt has received the [place] but has not opened it yet she plans on opening it this week    Time 4    Period Weeks    Status On-going                 Plan - 01/12/21 1201    Clinical Impression Statement Continued with MLD to R breast since pt is having increased discomfort in R breast due to swelling. Pt reports she has been wearing her compression bra all the time to help manage her swelling. Her fibrosis continues to be improved. She was measured for a gauntlet on Friday since her last one did not fit well.    PT Frequency 2x / week    PT Duration 4 weeks    PT Treatment/Interventions ADLs/Self Care Home Management;Therapeutic exercise;Patient/family education;Manual techniques;Manual lymph drainage;Compression bandaging;Scar mobilization;Passive range of motion;Taping;Vasopneumatic Device    PT Next Visit Plan Cont STW, PROM, MLD, encourage to wear sleeve longer periods of time during the day to prevent increased swelling.  Continue until flexitouch recieved    PT Home Exercise Plan Supine scapular series    Consulted and Agree with Plan of Care Patient           Patient will benefit from skilled therapeutic intervention in order to improve the following deficits and impairments:  Pain,Postural dysfunction,Impaired UE functional use,Increased fascial restricitons,Decreased strength,Decreased knowledge of use of DME,Decreased knowledge of precautions,Decreased range of motion,Increased edema  Visit  Diagnosis: Lymphedema, not elsewhere classified     Problem List Patient Active Problem List   Diagnosis Date Noted  . Stomatitis 08/10/2020  . Varicose veins of bilateral lower extremities with other complications 00/93/8182  . Dry skin dermatitis 07/27/2019  . Appendicitis 11/28/2018  . De Quervain's disease (tenosynovitis) 05/15/2018  . Bilateral carpal tunnel syndrome 05/15/2018  . Neuropathy due to chemotherapeutic drug ([place]) 02/11/2018  . Osteoporosis 03/23/2017  . Breast cancer metastasized to axillary lymph node ([place]) 06/21/2016  . Malignant neoplasm of upper-outer quadrant of right breast in female, estrogen receptor positive ([place]) 06/21/2016  . Elevated blood pressure reading without diagnosis of hypertension 04/28/2016  . Herpes genitalis--since 2005 per patient   . Environmental and seasonal allergies 07/28/2012  . Fibromyalgia   . Anxiety 02/07/2012  . Depression (emotion) 01/04/2012  . Headache-migranes 01/04/2012    Allyson Sabal [facility] 01/12/2021, 12:04 PM  [place], [place], [address] Phone: [phone]   Fax:  [phone]  Name: April [name] MRN: [id number] Date of Birth: 01-09-1961

## 2021-01-14 NOTE — Telephone Encounter (Signed)
Patient is approved for [employer] at no cost from [employer] 01/13/21-12/03/21  April [name] Patient [place] Phone [phone] Fax [phone] 01/14/2021 9:29 AM

## 2021-01-17 ENCOUNTER — Ambulatory Visit (HOSPITAL_COMMUNITY): Admission: RE | Admit: 2021-01-17 | Payer: Medicaid Other | Source: Ambulatory Visit

## 2021-01-19 ENCOUNTER — Ambulatory Visit: Payer: Medicaid Other | Admitting: Physical Therapy

## 2021-01-19 ENCOUNTER — Other Ambulatory Visit: Payer: Self-pay

## 2021-01-19 ENCOUNTER — Encounter: Payer: Self-pay | Admitting: Physical Therapy

## 2021-01-19 DIAGNOSIS — M25611 Stiffness of right shoulder, not elsewhere classified: Secondary | ICD-10-CM

## 2021-01-19 DIAGNOSIS — I890 Lymphedema, not elsewhere classified: Secondary | ICD-10-CM

## 2021-01-19 NOTE — Therapy (Signed)
[place], [place], [address] Phone: [phone]   Fax:  [phone]  Physical Therapy Treatment  Patient Details  Name: April [name] MRN: [id number] Date of Birth: 02/20/1961 Referring Provider (PT): Causey   Encounter Date: 01/19/2021   PT End of Session - 01/19/21 1559    Visit Number 20    Number of Visits 25    Date for PT Re-Evaluation 01/25/21    Authorization Type Medicaid healthy blue    Authorization - Visit Number 68    Authorization - Number of Visits 27    PT Start Time 1512    PT Stop Time 1600    PT Time Calculation (min) 48 min    Activity Tolerance Patient tolerated treatment well    Behavior During Therapy [facility] for tasks assessed/performed           Past Medical History:  Diagnosis Date  . Allergy 07/28/2012   Seasonal/Environmental allergies  . Anxiety 2013   Since 2013  . Arthritis 2014 per patient    knees and shoulders  . Bilateral ankle fractures 07/2015   Booted and resolved   . Cancer [facility]) dx June 22, 2016   right breast  . Depression 2013   Multiple  episodes  in past.  . Elevated cholesterol 2017  . Fibromyalgia 2013   diagnosed by Dr. [name]  . Genital herpes 2005   Has outbreaks monthly if not on preventative medication  . GERD (gastroesophageal reflux disease) 2013  . History of radiation therapy 02/07/17- 03/21/17   Right Breast- 4 field 25 fractions. 50 Gy to SCLV/PAB in 25 fractions. Right Breast Boost 10 gy in 5 fractions.  . Migraine 2013   migraines  . Neuromuscular disorder ([place]) 03/20/2017   neuropathy in fingers and toes from Chemo--intermittent  . Obesity   . Osteoporosis 03/23/2017   noted per bone density scan  . Peripheral neuropathy 08/13/2017  . Personal history of chemotherapy 11/2016  . Personal history of radiation therapy    4/18  . Right wrist fracture 06/2015   Resolved  . Scoliosis of thoracic spine 01/04/2012  . Skin  condition 2012   patient reports periodic episodes of severe itching. She will itch and then blister at areas including her arms, back, and buttocks.   . Urinary, incontinence, stress female 07/14/2016   patient reported    Past Surgical History:  Procedure Laterality Date  . AXILLARY LYMPH NODE DISSECTION Right 12/26/2016   Procedure: RIGHT AXILLARY LYMPH NODE DISSECTION;  Surgeon: Excell Seltzer, MD;  Location: [place];  Service: General;  Laterality: Right;  . BREAST LUMPECTOMY Right 2018  . BREAST LUMPECTOMY WITH NEEDLE LOCALIZATION Right 12/19/2016   Procedure: RIGHT BREAST NEEDLE LOCALIZED LUMPECTOMY, RIGHT RADIOACTIVE SEED TARGETED AXILLARY SENTINEL LYMPH NODE BIOPSY;  Surgeon: Excell Seltzer, MD;  Location: [place];  Service: General;  Laterality: Right;  . IR GENERIC HISTORICAL  10/09/2016   IR CV LINE INJECTION 10/09/2016 Aletta Edouard, MD [facility]INTER[facility]  . LAPAROSCOPIC APPENDECTOMY N/A 11/28/2018   Procedure: APPENDECTOMY LAPAROSCOPIC;  Surgeon: [name], MD;  Location: [place];  Service: General;  Laterality: N/A;  . PORT-A-CATH REMOVAL Left 12/19/2016   Procedure: REMOVAL PORT-A-CATH;  Surgeon: Excell Seltzer, MD;  Location: [place];  Service: General;  Laterality: Left;  . PORTACATH PLACEMENT N/A 07/11/2016   Procedure: INSERTION PORT-A-CATH;  Surgeon: Excell Seltzer, MD;  Location: [facility];  Service: General;  Laterality:  N/A;  . RADIOACTIVE SEED GUIDED AXILLARY SENTINEL LYMPH NODE Right 12/19/2016   Procedure: RADIOACTIVE SEED GUIDED AXILLARY SENTINEL LYMPH NODE BIOPSY;  Surgeon: Excell Seltzer, MD;  Location: [place];  Service: General;  Laterality: Right;  . WISDOM TOOTH EXTRACTION  yrs ago    There were no vitals filed for this visit.   Subjective Assessment - 01/19/21 1512    Subjective My swelling seems to be doing pretty good. I got my new glove. I am having some problems with it  but I am hoping it will stretch out.    Pertinent History R breast cancer, R lumpectomy in 01-04-17 with SLNB (3 and some were positive), then had ALNB (20 some were postive), pt completed chemo and radiation, then pt had recurrence in May 2021 and is stage IV, it has spead to lymph nodes and lung    Patient Stated Goals to decrease swelling in arm    Currently in Pain? Yes    Pain Score 5     Pain Location Breast    Pain Orientation Right    Pain Descriptors / Indicators Aching    Pain Type Acute pain    Pain Onset 1 to 4 weeks ago                     Flowsheet Row Outpatient Rehab from 09/08/2020 in [place]  Lymphedema Life Impact Scale Total Score 57.35 %            [facility] Adult PT Treatment/Exercise - 01/19/21 0001      Manual Therapy   Soft tissue mobilization to R serratus anterior in area of trigger points    Manual Lymphatic Drainage (MLD) short neck, left axillary LN and right inguinal LN, anterior interaxillary anastomosis, right axillo-inguinal pathway, R breast, then retracing all steps    Passive ROM PROM Right shoulder in all directions-                       PT Long Term Goals - 12/28/20 0911      PT LONG TERM GOAL #1   Title Pt will obtain appropriate compression garments for long term management of lymphedmea.    Baseline achieved sleeve and compression bra    Time 4    Period Weeks    Status Achieved      PT LONG TERM GOAL #2   Title Pt will demonstrate 120 degrees of left shoulder flexion to allow her to reach overhead.    Baseline 95    Time 4    Period Weeks    Status Achieved      PT LONG TERM GOAL #3   Title Pt will demonstrate 90 degrees of right shoulder abduction to allow her to reach out to the side.    Baseline 62    Time 4    Period Weeks    Status Achieved      PT LONG TERM GOAL #4   Title Pt will be independent with a home exercise program for continued strengthening and  stretching.    Baseline not educated yet    Time 4    Status Achieved      PT LONG TERM GOAL #5   Title Pt will report a 50% improvement in R axillary pain to allow improved comfort.    Time 4    Period Weeks    Status Achieved      PT LONG  TERM GOAL #6   Title Pt will have left shoulder flex 145 degrees for improved reaching ability    Time 4    Period Weeks    Status Achieved      PT LONG TERM GOAL #7   Title Pt. is independent in self management of Lymphedema    Baseline pt awaiting flexi touch, and will try to wear compression sleeve greater than 2 hours at a time to keep swelling down; 12/28/20- pt has received the [place] but has not opened it yet she plans on opening it this week    Time 4    Period Weeks    Status On-going                 Plan - 01/19/21 1600    Clinical Impression Statement Pt having increased R breast pain today so focused on MLD to R breast to improve comfort. Pt also having tightness with R shoulder ROM and tenderness to touch in area of serratus anterior. Worked on soft tissue mobilization to this area today. Pt has been wearing her compression bra and feels it is helping. She received her new sleeve and glove and reports the sleeve fits better at the elbow but she is still having tightness with the glove and has been using markers in the fingers to help stretch it out. She will contact the DME supplier if this does not help.    PT Frequency 2x / week    PT Duration 4 weeks    PT Treatment/Interventions ADLs/Self Care Home Management;Therapeutic exercise;Patient/family education;Manual techniques;Manual lymph drainage;Compression bandaging;Scar mobilization;Passive range of motion;Taping;Vasopneumatic Device    PT Next Visit Plan Cont STW, PROM, MLD, encourage to wear sleeve longer periods of time during the day to prevent increased swelling.  Continue until flexitouch recieved    PT Home Exercise Plan Supine scapular series    Consulted and  Agree with Plan of Care Patient           Patient will benefit from skilled therapeutic intervention in order to improve the following deficits and impairments:  Pain,Postural dysfunction,Impaired UE functional use,Increased fascial restricitons,Decreased strength,Decreased knowledge of use of DME,Decreased knowledge of precautions,Decreased range of motion,Increased edema  Visit Diagnosis: Lymphedema, not elsewhere classified  Stiffness of right shoulder, not elsewhere classified     Problem List Patient Active Problem List   Diagnosis Date Noted  . Stomatitis 08/10/2020  . Varicose veins of bilateral lower extremities with other complications 46/65/9935  . Dry skin dermatitis 07/27/2019  . Appendicitis 11/28/2018  . De Quervain's disease (tenosynovitis) 05/15/2018  . Bilateral carpal tunnel syndrome 05/15/2018  . Neuropathy due to chemotherapeutic drug ([place]) 02/11/2018  . Osteoporosis 03/23/2017  . Breast cancer metastasized to axillary lymph node ([place]) 06/21/2016  . Malignant neoplasm of upper-outer quadrant of right breast in female, estrogen receptor positive ([place]) 06/21/2016  . Elevated blood pressure reading without diagnosis of hypertension 04/28/2016  . Herpes genitalis--since 2005 per patient   . Environmental and seasonal allergies 07/28/2012  . Fibromyalgia   . Anxiety 02/07/2012  . Depression (emotion) 01/04/2012  . Headache-migranes 01/04/2012    Allyson Sabal [facility] 01/19/2021, 4:06 PM  [place], [place], [address] Phone: [phone]   Fax:  [phone]  Name: April [name] MRN: [id number] Date of Birth: 01-04-1961  Manus Gunning, PT 01/19/21 4:06 PM

## 2021-01-20 ENCOUNTER — Ambulatory Visit
Admission: RE | Admit: 2021-01-20 | Discharge: 2021-01-20 | Disposition: A | Discharge status: Home / Self Care | Payer: Medicaid Other | Source: Ambulatory Visit | Attending: Oncology | Admitting: Oncology

## 2021-01-20 DIAGNOSIS — C50411 Malignant neoplasm of upper-outer quadrant of right female breast: Secondary | ICD-10-CM

## 2021-01-20 DIAGNOSIS — G620 Drug-induced polyneuropathy: Secondary | ICD-10-CM

## 2021-01-20 DIAGNOSIS — C50919 Malignant neoplasm of unspecified site of unspecified female breast: Secondary | ICD-10-CM

## 2021-01-20 DIAGNOSIS — G6289 Other specified polyneuropathies: Secondary | ICD-10-CM

## 2021-01-20 DIAGNOSIS — Z170 Estrogen receptor positive status [ER+]: Secondary | ICD-10-CM

## 2021-01-20 DIAGNOSIS — M797 Fibromyalgia: Secondary | ICD-10-CM

## 2021-01-20 DIAGNOSIS — M818 Other osteoporosis without current pathological fracture: Secondary | ICD-10-CM

## 2021-01-21 ENCOUNTER — Other Ambulatory Visit: Payer: Self-pay

## 2021-01-21 ENCOUNTER — Ambulatory Visit (HOSPITAL_COMMUNITY)
Admission: RE | Admit: 2021-01-21 | Discharge: 2021-01-21 | Disposition: A | Discharge status: Home / Self Care | Payer: Medicare Other | Source: Ambulatory Visit | Attending: Oncology | Admitting: Oncology

## 2021-01-21 DIAGNOSIS — M818 Other osteoporosis without current pathological fracture: Secondary | ICD-10-CM | POA: Diagnosis present

## 2021-01-21 DIAGNOSIS — G620 Drug-induced polyneuropathy: Secondary | ICD-10-CM | POA: Diagnosis present

## 2021-01-21 DIAGNOSIS — C50919 Malignant neoplasm of unspecified site of unspecified female breast: Secondary | ICD-10-CM | POA: Diagnosis present

## 2021-01-21 DIAGNOSIS — C773 Secondary and unspecified malignant neoplasm of axilla and upper limb lymph nodes: Secondary | ICD-10-CM | POA: Diagnosis present

## 2021-01-21 DIAGNOSIS — Z170 Estrogen receptor positive status [ER+]: Secondary | ICD-10-CM | POA: Insufficient documentation

## 2021-01-21 DIAGNOSIS — C50411 Malignant neoplasm of upper-outer quadrant of right female breast: Secondary | ICD-10-CM | POA: Insufficient documentation

## 2021-01-21 DIAGNOSIS — M797 Fibromyalgia: Secondary | ICD-10-CM | POA: Insufficient documentation

## 2021-01-21 DIAGNOSIS — T451X5A Adverse effect of antineoplastic and immunosuppressive drugs, initial encounter: Secondary | ICD-10-CM | POA: Diagnosis present

## 2021-01-21 DIAGNOSIS — G6289 Other specified polyneuropathies: Secondary | ICD-10-CM | POA: Diagnosis present

## 2021-01-21 MED ORDER — IOHEXOL 300 MG/ML  SOLN
75.0000 mL | Freq: Once | INTRAMUSCULAR | Status: AC | PRN
  Administered 2021-01-21: 75 mL via INTRAVENOUS

## 2021-01-24 ENCOUNTER — Other Ambulatory Visit: Payer: Self-pay

## 2021-01-24 ENCOUNTER — Other Ambulatory Visit: Payer: Medicaid Other

## 2021-01-24 ENCOUNTER — Inpatient Hospital Stay: Payer: Medicaid Other | Attending: Oncology

## 2021-01-24 ENCOUNTER — Ambulatory Visit: Payer: Medicaid Other

## 2021-01-24 ENCOUNTER — Inpatient Hospital Stay: Payer: Medicaid Other

## 2021-01-24 ENCOUNTER — Inpatient Hospital Stay (HOSPITAL_BASED_OUTPATIENT_CLINIC_OR_DEPARTMENT_OTHER): Payer: Medicaid Other | Admitting: Oncology

## 2021-01-24 VITALS — BP 114/78 | HR 79 | Temp 97.9°F | Resp 20 | Ht 64.0 in | Wt 216.7 lb

## 2021-01-24 DIAGNOSIS — Z170 Estrogen receptor positive status [ER+]: Secondary | ICD-10-CM

## 2021-01-24 DIAGNOSIS — M810 Age-related osteoporosis without current pathological fracture: Secondary | ICD-10-CM

## 2021-01-24 DIAGNOSIS — Z87891 Personal history of nicotine dependence: Secondary | ICD-10-CM | POA: Insufficient documentation

## 2021-01-24 DIAGNOSIS — C50919 Malignant neoplasm of unspecified site of unspecified female breast: Secondary | ICD-10-CM | POA: Diagnosis not present

## 2021-01-24 DIAGNOSIS — C50411 Malignant neoplasm of upper-outer quadrant of right female breast: Secondary | ICD-10-CM | POA: Insufficient documentation

## 2021-01-24 DIAGNOSIS — Z5111 Encounter for antineoplastic chemotherapy: Secondary | ICD-10-CM | POA: Diagnosis present

## 2021-01-24 DIAGNOSIS — C50911 Malignant neoplasm of unspecified site of right female breast: Secondary | ICD-10-CM | POA: Diagnosis not present

## 2021-01-24 DIAGNOSIS — Z923 Personal history of irradiation: Secondary | ICD-10-CM | POA: Diagnosis not present

## 2021-01-24 DIAGNOSIS — Z9221 Personal history of antineoplastic chemotherapy: Secondary | ICD-10-CM | POA: Insufficient documentation

## 2021-01-24 DIAGNOSIS — M818 Other osteoporosis without current pathological fracture: Secondary | ICD-10-CM

## 2021-01-24 DIAGNOSIS — Z79899 Other long term (current) drug therapy: Secondary | ICD-10-CM | POA: Diagnosis not present

## 2021-01-24 DIAGNOSIS — C773 Secondary and unspecified malignant neoplasm of axilla and upper limb lymph nodes: Secondary | ICD-10-CM

## 2021-01-24 LAB — CBC WITH DIFFERENTIAL (CANCER CENTER ONLY)
Abs Immature Granulocytes: 0.01 10*3/uL (ref 0.00–0.07)
Basophils Absolute: 0 10*3/uL (ref 0.0–0.1)
Basophils Relative: 1 %
Eosinophils Absolute: 0.1 10*3/uL (ref 0.0–0.5)
Eosinophils Relative: 3 %
HCT: 36.7 % (ref 36.0–46.0)
Hemoglobin: 12.5 g/dL (ref 12.0–15.0)
Immature Granulocytes: 1 %
Lymphocytes Relative: 36 %
Lymphs Abs: 0.7 10*3/uL (ref 0.7–4.0)
MCH: 35.2 pg — ABNORMAL HIGH (ref 26.0–34.0)
MCHC: 34.1 g/dL (ref 30.0–36.0)
MCV: 103.4 fL — ABNORMAL HIGH (ref 80.0–100.0)
Monocytes Absolute: 0.1 10*3/uL (ref 0.1–1.0)
Monocytes Relative: 4 %
Neutro Abs: 1 10*3/uL — ABNORMAL LOW (ref 1.7–7.7)
Neutrophils Relative %: 55 %
Platelet Count: 325 10*3/uL (ref 150–400)
RBC: 3.55 MIL/uL — ABNORMAL LOW (ref 3.87–5.11)
RDW: 13.3 % (ref 11.5–15.5)
WBC Count: 1.8 10*3/uL — ABNORMAL LOW (ref 4.0–10.5)
nRBC: 0 % (ref 0.0–0.2)

## 2021-01-24 LAB — CMP (CANCER CENTER ONLY)
ALT: 9 U/L (ref 0–44)
AST: 12 U/L — ABNORMAL LOW (ref 15–41)
Albumin: 3.7 g/dL (ref 3.5–5.0)
Alkaline Phosphatase: 73 U/L (ref 38–126)
Anion gap: 10 (ref 5–15)
BUN: 12 mg/dL (ref 6–20)
CO2: 25 mmol/L (ref 22–32)
Calcium: 9 mg/dL (ref 8.9–10.3)
Chloride: 107 mmol/L (ref 98–111)
Creatinine: 1.01 mg/dL — ABNORMAL HIGH (ref 0.44–1.00)
GFR, Estimated: >60 mL/min (ref >60)
Glucose, Bld: 90 mg/dL (ref 70–99)
Potassium: 4.2 mmol/L (ref 3.5–5.1)
Sodium: 142 mmol/L (ref 135–145)
Total Bilirubin: 0.3 mg/dL (ref 0.3–1.2)
Total Protein: 7.1 g/dL (ref 6.5–8.1)

## 2021-01-24 MED ORDER — FULVESTRANT 250 MG/5ML IM SOLN
INTRAMUSCULAR | Status: AC
  Filled 2021-01-24: qty 10

## 2021-01-24 MED ORDER — FULVESTRANT 250 MG/5ML IM SOLN
500.0000 mg | INTRAMUSCULAR | Status: DC
  Administered 2021-01-24: 500 mg via INTRAMUSCULAR

## 2021-01-24 MED ORDER — PALBOCICLIB 75 MG PO CAPS: 75.0000 mg | ORAL_CAPSULE | Freq: Every day | ORAL | 6 refills | Status: DC

## 2021-01-24 NOTE — Patient Instructions (Signed)
Fulvestrant injection What is this medicine? FULVESTRANT (ful VES trant) blocks the effects of estrogen. It is used to treat breast cancer. This medicine may be used for other purposes; ask your health care provider or pharmacist if you have questions. COMMON BRAND NAME(S): FASLODEX What should I tell my health care provider before I take this medicine? They need to know if you have any of these conditions:  bleeding disorders  liver disease  low blood counts, like low white cell, platelet, or red cell counts  an unusual or allergic reaction to fulvestrant, other medicines, foods, dyes, or preservatives  pregnant or trying to get pregnant  breast-feeding How should I use this medicine? This medicine is for injection into a muscle. It is usually given by a health care professional in a hospital or clinic setting. Talk to your pediatrician regarding the use of this medicine in children. Special care may be needed. Overdosage: If you think you have taken too much of this medicine contact a poison control center or emergency room at once. NOTE: This medicine is only for you. Do not share this medicine with others. What if I miss a dose? It is important not to miss your dose. Call your doctor or health care professional if you are unable to keep an appointment. What may interact with this medicine?  medicines that treat or prevent blood clots like warfarin, enoxaparin, dalteparin, apixaban, dabigatran, and rivaroxaban This list may not describe all possible interactions. Give your health care provider a list of all the medicines, herbs, non-prescription drugs, or dietary supplements you use. Also tell them if you smoke, drink alcohol, or use illegal drugs. Some items may interact with your medicine. What should I watch for while using this medicine? Your condition will be monitored carefully while you are receiving this medicine. You will need important blood work done while you are taking  this medicine. Do not become pregnant while taking this medicine or for at least 1 year after stopping it. Women of child-bearing potential will need to have a negative pregnancy test before starting this medicine. Women should inform their doctor if they wish to become pregnant or think they might be pregnant. There is a potential for serious side effects to an unborn child. Men should inform their doctors if they wish to father a child. This medicine may lower sperm counts. Talk to your health care professional or pharmacist for more information. Do not breast-feed an infant while taking this medicine or for 1 year after the last dose. What side effects may I notice from receiving this medicine? Side effects that you should report to your doctor or health care professional as soon as possible:  allergic reactions like skin rash, itching or hives, swelling of the face, lips, or tongue  feeling faint or lightheaded, falls  pain, tingling, numbness, or weakness in the legs  signs and symptoms of infection like fever or chills; cough; flu-like symptoms; sore throat  vaginal bleeding Side effects that usually do not require medical attention (report to your doctor or health care professional if they continue or are bothersome):  aches, pains  constipation  diarrhea  headache  hot flashes  nausea, vomiting  pain at site where injected  stomach pain This list may not describe all possible side effects. Call your doctor for medical advice about side effects. You may report side effects to FDA at 1-800-FDA-1088. Where should I keep my medicine? This drug is given in a hospital or clinic and will   not be stored at home. NOTE: This sheet is a summary. It may not cover all possible information. If you have questions about this medicine, talk to your doctor, pharmacist, or health care provider.  2021 Elsevier/Gold Standard (2018-02-28 11:34:41)  
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
## 2021-01-25 LAB — CANCER ANTIGEN 27.29: CA 27.29: 125 U/mL — ABNORMAL HIGH (ref 0.0–38.6)

## 2021-01-26 ENCOUNTER — Telehealth: Payer: Self-pay | Admitting: Oncology

## 2021-01-26 ENCOUNTER — Other Ambulatory Visit: Payer: Self-pay

## 2021-01-26 ENCOUNTER — Ambulatory Visit: Payer: Medicaid Other | Admitting: Physical Therapy

## 2021-01-26 ENCOUNTER — Encounter: Payer: Self-pay | Admitting: Physical Therapy

## 2021-01-26 DIAGNOSIS — I890 Lymphedema, not elsewhere classified: Secondary | ICD-10-CM | POA: Diagnosis not present

## 2021-01-26 DIAGNOSIS — M25611 Stiffness of right shoulder, not elsewhere classified: Secondary | ICD-10-CM

## 2021-01-26 NOTE — Therapy (Signed)
[place], [place], [address] Phone: [phone]   Fax:  [phone]  Physical Therapy Treatment  Patient Details  Name: April [name] MRN: [id number] Date of Birth: 06-09-1961 Referring Provider (PT): Causey   Encounter Date: 01/26/2021   PT End of Session - 01/26/21 1559    Visit Number 21    Number of Visits 25    Date for PT Re-Evaluation 01/25/21    Authorization Type Medicaid healthy blue    Authorization - Visit Number 38    Authorization - Number of Visits 27    PT Start Time 1509    PT Stop Time 1600    PT Time Calculation (min) 51 min    Activity Tolerance Patient tolerated treatment well    Behavior During Therapy [facility] for tasks assessed/performed           Past Medical History:  Diagnosis Date  . Allergy 07/28/2012   Seasonal/Environmental allergies  . Anxiety 2013   Since 2013  . Arthritis 2014 per patient    knees and shoulders  . Bilateral ankle fractures 07/2015   Booted and resolved   . Cancer [facility]) dx June 22, 2016   right breast  . Depression 2013   Multiple  episodes  in past.  . Elevated cholesterol 2017  . Fibromyalgia 2013   diagnosed by Dr. [name]  . Genital herpes 2005   Has outbreaks monthly if not on preventative medication  . GERD (gastroesophageal reflux disease) 2013  . History of radiation therapy 02/07/17- 03/21/17   Right Breast- 4 field 25 fractions. 50 Gy to SCLV/PAB in 25 fractions. Right Breast Boost 10 gy in 5 fractions.  . Migraine 2013   migraines  . Neuromuscular disorder ([place]) 03/20/2017   neuropathy in fingers and toes from Chemo--intermittent  . Obesity   . Osteoporosis 03/23/2017   noted per bone density scan  . Peripheral neuropathy 08/13/2017  . Personal history of chemotherapy 11/2016  . Personal history of radiation therapy    4/18  . Right wrist fracture 06/2015   Resolved  . Scoliosis of thoracic spine 01/04/2012  . Skin  condition 2012   patient reports periodic episodes of severe itching. She will itch and then blister at areas including her arms, back, and buttocks.   . Urinary, incontinence, stress female 07/14/2016   patient reported    Past Surgical History:  Procedure Laterality Date  . AXILLARY LYMPH NODE DISSECTION Right 12/26/2016   Procedure: RIGHT AXILLARY LYMPH NODE DISSECTION;  Surgeon: Excell Seltzer, MD;  Location: [place];  Service: General;  Laterality: Right;  . BREAST LUMPECTOMY Right 2018  . BREAST LUMPECTOMY WITH NEEDLE LOCALIZATION Right 12/19/2016   Procedure: RIGHT BREAST NEEDLE LOCALIZED LUMPECTOMY, RIGHT RADIOACTIVE SEED TARGETED AXILLARY SENTINEL LYMPH NODE BIOPSY;  Surgeon: Excell Seltzer, MD;  Location: [place];  Service: General;  Laterality: Right;  . IR GENERIC HISTORICAL  10/09/2016   IR CV LINE INJECTION 10/09/2016 Aletta Edouard, MD [facility]INTER[facility]  . LAPAROSCOPIC APPENDECTOMY N/A 11/28/2018   Procedure: APPENDECTOMY LAPAROSCOPIC;  Surgeon: [name], MD;  Location: [place];  Service: General;  Laterality: N/A;  . PORT-A-CATH REMOVAL Left 12/19/2016   Procedure: REMOVAL PORT-A-CATH;  Surgeon: Excell Seltzer, MD;  Location: [place];  Service: General;  Laterality: Left;  . PORTACATH PLACEMENT N/A 07/11/2016   Procedure: INSERTION PORT-A-CATH;  Surgeon: Excell Seltzer, MD;  Location: [facility];  Service: General;  Laterality:  N/A;  . RADIOACTIVE SEED GUIDED AXILLARY SENTINEL LYMPH NODE Right 12/19/2016   Procedure: RADIOACTIVE SEED GUIDED AXILLARY SENTINEL LYMPH NODE BIOPSY;  Surgeon: Excell Seltzer, MD;  Location: [place];  Service: General;  Laterality: Right;  . WISDOM TOOTH EXTRACTION  yrs ago    There were no vitals filed for this visit.   Subjective Assessment - 01/26/21 1510    Subjective My swelling is about the same. I had an ultrasound and scan and my tumor size is exactly the same.  I have the [place] set up and I do it once a day. I started it on 2/14.    Pertinent History R breast cancer, R lumpectomy in 01-04-17 with SLNB (3 and some were positive), then had ALNB (20 some were postive), pt completed chemo and radiation, then pt had recurrence in May 2021 and is stage IV, it has spead to lymph nodes and lung    Patient Stated Goals to decrease swelling in arm    Currently in Pain? No/denies   pt reports she has stiffness                    Flowsheet Row Outpatient Rehab from 09/08/2020 in [place]  Lymphedema Life Impact Scale Total Score 57.35 %            [facility] Adult PT Treatment/Exercise - 01/26/21 0001      Manual Therapy   Soft tissue mobilization to R pec in area of tightness    Manual Lymphatic Drainage (MLD) short neck, left axillary LN and right inguinal LN, anterior interaxillary anastomosis, right axillo-inguinal pathway, R breast, then retracing all steps    Passive ROM brief PROM in to flexion                       PT Long Term Goals - 01/26/21 1557      PT LONG TERM GOAL #1   Title Pt will obtain appropriate compression garments for long term management of lymphedmea.    Baseline achieved sleeve and compression bra    Time 4    Period Weeks    Status Achieved      PT LONG TERM GOAL #2   Title Pt will demonstrate 120 degrees of left shoulder flexion to allow her to reach overhead.    Baseline 95    Time 4    Period Weeks    Status Achieved      PT LONG TERM GOAL #3   Title Pt will demonstrate 90 degrees of right shoulder abduction to allow her to reach out to the side.    Baseline 62    Time 4    Period Weeks    Status Achieved      PT LONG TERM GOAL #4   Title Pt will be independent with a home exercise program for continued strengthening and stretching.    Time 4    Period Weeks    Status Achieved      PT LONG TERM GOAL #5   Title Pt will report a 50%  improvement in R axillary pain to allow improved comfort.    Time 4    Period Weeks    Status Achieved      PT LONG TERM GOAL #6   Title Pt will have left shoulder flex 145 degrees for improved reaching ability    Time 4    Period Weeks    Status Achieved  PT LONG TERM GOAL #7   Title Pt. is independent in self management of Lymphedema    Baseline pt awaiting flexi touch, and will try to wear compression sleeve greater than 2 hours at a time to keep swelling down; 12/28/20- pt has received the [place] but has not opened it yet she plans on opening it this week; 01/26/21- pt has received her flexitouch and has been using it for an hour a day, she has been wearing her compression sleeve, gauntlet and bra, fibrosis was improved today    Time 4    Period Weeks    Status Achieved                 Plan - 01/26/21 1600    Clinical Impression Statement Pt has received her FlexiTouch compression pump and has been using it daily. Her fibrosis in her R breast is improving. She has full ROM Of R shoulder. She has now met all goals her therapy. Her compression garments fit and pt has been able to wear them. She still has some discomfort with the gauntlet but educated pt that if it is loosened too much it will allow swelling in her hand. She has been stretching it out using markers. The sleeve fits much better. Pt is now ready for discharge from skilled PT services.    PT Frequency 2x / week    PT Duration 4 weeks    PT Treatment/Interventions ADLs/Self Care Home Management;Therapeutic exercise;Patient/family education;Manual techniques;Manual lymph drainage;Compression bandaging;Scar mobilization;Passive range of motion;Taping;Vasopneumatic Device    PT Next Visit Plan d/c this visit    PT Home Exercise Plan Supine scapular series    Consulted and Agree with Plan of Care Patient           Patient will benefit from skilled therapeutic intervention in order to improve the following  deficits and impairments:  Pain,Postural dysfunction,Impaired UE functional use,Increased fascial restricitons,Decreased strength,Decreased knowledge of use of DME,Decreased knowledge of precautions,Decreased range of motion,Increased edema  Visit Diagnosis: Lymphedema, not elsewhere classified  Stiffness of right shoulder, not elsewhere classified     Problem List Patient Active Problem List   Diagnosis Date Noted  . Stomatitis 08/10/2020  . Varicose veins of bilateral lower extremities with other complications 40/98/1191  . Dry skin dermatitis 07/27/2019  . Appendicitis 11/28/2018  . De Quervain's disease (tenosynovitis) 05/15/2018  . Bilateral carpal tunnel syndrome 05/15/2018  . Neuropathy due to chemotherapeutic drug ([place]) 02/11/2018  . Osteoporosis 03/23/2017  . Breast cancer metastasized to axillary lymph node ([place]) 06/21/2016  . Malignant neoplasm of upper-outer quadrant of right breast in female, estrogen receptor positive ([place]) 06/21/2016  . Elevated blood pressure reading without diagnosis of hypertension 04/28/2016  . Herpes genitalis--since 2005 per patient   . Environmental and seasonal allergies 07/28/2012  . Fibromyalgia   . Anxiety 02/07/2012  . Depression (emotion) 01/04/2012  . Headache-migranes 01/04/2012    Allyson Sabal [facility] 01/26/2021, 4:04 PM  [place], [place], [address] Phone: [phone]   Fax:  [phone]  Name: April [name] MRN: [id number] Date of Birth: [birth year]  PHYSICAL THERAPY DISCHARGE SUMMARY  Visits from Start of Care: 21  Current functional level related to goals / functional outcomes: All goals met   Remaining deficits: None   Education / Equipment: Self MLD, compression pump, compression garments, HEP  Plan: Patient agrees to discharge.  Patient goals were met. Patient is being discharged due to meeting the stated rehab  goals.  ?????      Allyson Sabal [place], [place] 01/26/21 4:05 PM

## 2021-01-26 NOTE — Telephone Encounter (Signed)
Scheduled office visit between appt that were already on pt's schedule. Made not changes to pt's original arrival time.

## 2021-02-16 ENCOUNTER — Encounter: Payer: Medicaid Other | Admitting: Physical Therapy

## 2021-02-18 ENCOUNTER — Other Ambulatory Visit: Payer: Self-pay

## 2021-02-18 DIAGNOSIS — C50411 Malignant neoplasm of upper-outer quadrant of right female breast: Secondary | ICD-10-CM

## 2021-02-18 DIAGNOSIS — Z170 Estrogen receptor positive status [ER+]: Secondary | ICD-10-CM

## 2021-02-21 ENCOUNTER — Inpatient Hospital Stay: Payer: Medicaid Other

## 2021-02-21 ENCOUNTER — Inpatient Hospital Stay: Payer: Medicaid Other | Attending: Oncology

## 2021-02-21 ENCOUNTER — Other Ambulatory Visit: Payer: Self-pay

## 2021-02-21 VITALS — BP 121/98 | HR 80 | Temp 98.2°F | Resp 18

## 2021-02-21 DIAGNOSIS — C50411 Malignant neoplasm of upper-outer quadrant of right female breast: Secondary | ICD-10-CM

## 2021-02-21 DIAGNOSIS — C50919 Malignant neoplasm of unspecified site of unspecified female breast: Secondary | ICD-10-CM

## 2021-02-21 DIAGNOSIS — Z79818 Long term (current) use of other agents affecting estrogen receptors and estrogen levels: Secondary | ICD-10-CM | POA: Diagnosis not present

## 2021-02-21 DIAGNOSIS — Z170 Estrogen receptor positive status [ER+]: Secondary | ICD-10-CM

## 2021-02-21 DIAGNOSIS — C773 Secondary and unspecified malignant neoplasm of axilla and upper limb lymph nodes: Secondary | ICD-10-CM

## 2021-02-21 DIAGNOSIS — M810 Age-related osteoporosis without current pathological fracture: Secondary | ICD-10-CM

## 2021-02-21 LAB — CMP (CANCER CENTER ONLY)
ALT: 11 U/L (ref 0–44)
AST: 16 U/L (ref 15–41)
Albumin: 4 g/dL (ref 3.5–5.0)
Alkaline Phosphatase: 53 U/L (ref 38–126)
Anion gap: 7 (ref 5–15)
BUN: 11 mg/dL (ref 6–20)
CO2: 24 mmol/L (ref 22–32)
Calcium: 9.4 mg/dL (ref 8.9–10.3)
Chloride: 111 mmol/L (ref 98–111)
Creatinine: 0.83 mg/dL (ref 0.44–1.00)
GFR, Estimated: >60 mL/min (ref >60)
Glucose, Bld: 95 mg/dL (ref 70–99)
Potassium: 3.8 mmol/L (ref 3.5–5.1)
Sodium: 142 mmol/L (ref 135–145)
Total Bilirubin: 0.5 mg/dL (ref 0.3–1.2)
Total Protein: 7.3 g/dL (ref 6.5–8.1)

## 2021-02-21 LAB — CBC WITH DIFFERENTIAL (CANCER CENTER ONLY)
Abs Immature Granulocytes: 0.01 10*3/uL (ref 0.00–0.07)
Basophils Absolute: 0 10*3/uL (ref 0.0–0.1)
Basophils Relative: 1 %
Eosinophils Absolute: 0 10*3/uL (ref 0.0–0.5)
Eosinophils Relative: 2 %
HCT: 36.5 % (ref 36.0–46.0)
Hemoglobin: 12.4 g/dL (ref 12.0–15.0)
Immature Granulocytes: 0 %
Lymphocytes Relative: 28 %
Lymphs Abs: 0.7 10*3/uL (ref 0.7–4.0)
MCH: 35.7 pg — ABNORMAL HIGH (ref 26.0–34.0)
MCHC: 34 g/dL (ref 30.0–36.0)
MCV: 105.2 fL — ABNORMAL HIGH (ref 80.0–100.0)
Monocytes Absolute: 0.1 10*3/uL (ref 0.1–1.0)
Monocytes Relative: 6 %
Neutro Abs: 1.5 10*3/uL — ABNORMAL LOW (ref 1.7–7.7)
Neutrophils Relative %: 63 %
Platelet Count: 351 10*3/uL (ref 150–400)
RBC: 3.47 MIL/uL — ABNORMAL LOW (ref 3.87–5.11)
RDW: 13.7 % (ref 11.5–15.5)
WBC Count: 2.4 10*3/uL — ABNORMAL LOW (ref 4.0–10.5)
nRBC: 0 % (ref 0.0–0.2)

## 2021-02-21 MED ORDER — FULVESTRANT 250 MG/5ML IM SOLN
INTRAMUSCULAR | Status: AC
  Filled 2021-02-21: qty 10

## 2021-02-21 MED ORDER — FULVESTRANT 250 MG/5ML IM SOLN
500.0000 mg | INTRAMUSCULAR | Status: DC
  Administered 2021-02-21: 500 mg via INTRAMUSCULAR

## 2021-02-21 NOTE — Patient Instructions (Signed)
Fulvestrant injection What is this medicine? FULVESTRANT (ful VES trant) blocks the effects of estrogen. It is used to treat breast cancer. This medicine may be used for other purposes; ask your health care provider or pharmacist if you have questions. COMMON BRAND NAME(S): FASLODEX What should I tell my health care provider before I take this medicine? They need to know if you have any of these conditions:  bleeding disorders  liver disease  low blood counts, like low white cell, platelet, or red cell counts  an unusual or allergic reaction to fulvestrant, other medicines, foods, dyes, or preservatives  pregnant or trying to get pregnant  breast-feeding How should I use this medicine? This medicine is for injection into a muscle. It is usually given by a health care professional in a hospital or clinic setting. Talk to your pediatrician regarding the use of this medicine in children. Special care may be needed. Overdosage: If you think you have taken too much of this medicine contact a poison control center or emergency room at once. NOTE: This medicine is only for you. Do not share this medicine with others. What if I miss a dose? It is important not to miss your dose. Call your doctor or health care professional if you are unable to keep an appointment. What may interact with this medicine?  medicines that treat or prevent blood clots like warfarin, enoxaparin, dalteparin, apixaban, dabigatran, and rivaroxaban This list may not describe all possible interactions. Give your health care provider a list of all the medicines, herbs, non-prescription drugs, or dietary supplements you use. Also tell them if you smoke, drink alcohol, or use illegal drugs. Some items may interact with your medicine. What should I watch for while using this medicine? Your condition will be monitored carefully while you are receiving this medicine. You will need important blood work done while you are taking  this medicine. Do not become pregnant while taking this medicine or for at least 1 year after stopping it. Women of child-bearing potential will need to have a negative pregnancy test before starting this medicine. Women should inform their doctor if they wish to become pregnant or think they might be pregnant. There is a potential for serious side effects to an unborn child. Men should inform their doctors if they wish to father a child. This medicine may lower sperm counts. Talk to your health care professional or pharmacist for more information. Do not breast-feed an infant while taking this medicine or for 1 year after the last dose. What side effects may I notice from receiving this medicine? Side effects that you should report to your doctor or health care professional as soon as possible:  allergic reactions like skin rash, itching or hives, swelling of the face, lips, or tongue  feeling faint or lightheaded, falls  pain, tingling, numbness, or weakness in the legs  signs and symptoms of infection like fever or chills; cough; flu-like symptoms; sore throat  vaginal bleeding Side effects that usually do not require medical attention (report to your doctor or health care professional if they continue or are bothersome):  aches, pains  constipation  diarrhea  headache  hot flashes  nausea, vomiting  pain at site where injected  stomach pain This list may not describe all possible side effects. Call your doctor for medical advice about side effects. You may report side effects to FDA at 1-800-FDA-1088. Where should I keep my medicine? This drug is given in a hospital or clinic and will   not be stored at home. NOTE: This sheet is a summary. It may not cover all possible information. If you have questions about this medicine, talk to your doctor, pharmacist, or health care provider.  2021 Elsevier/Gold Standard (2018-02-28 11:34:41)  

## 2021-02-22 LAB — CANCER ANTIGEN 27.29: CA 27.29: 120.4 U/mL — ABNORMAL HIGH (ref 0.0–38.6)

## 2021-03-01 ENCOUNTER — Other Ambulatory Visit (HOSPITAL_COMMUNITY): Payer: Self-pay

## 2021-03-10 ENCOUNTER — Other Ambulatory Visit (HOSPITAL_COMMUNITY): Payer: Self-pay

## 2021-03-18 ENCOUNTER — Other Ambulatory Visit: Payer: Self-pay | Admitting: *Deleted

## 2021-03-18 DIAGNOSIS — Z170 Estrogen receptor positive status [ER+]: Secondary | ICD-10-CM

## 2021-03-18 DIAGNOSIS — C50411 Malignant neoplasm of upper-outer quadrant of right female breast: Secondary | ICD-10-CM
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
## 2021-03-21 ENCOUNTER — Ambulatory Visit: Payer: Medicaid Other

## 2021-03-21 ENCOUNTER — Inpatient Hospital Stay (HOSPITAL_BASED_OUTPATIENT_CLINIC_OR_DEPARTMENT_OTHER): Payer: Medicaid Other | Admitting: Oncology

## 2021-03-21 ENCOUNTER — Other Ambulatory Visit: Payer: Self-pay

## 2021-03-21 ENCOUNTER — Inpatient Hospital Stay: Payer: Medicaid Other | Attending: Oncology

## 2021-03-21 ENCOUNTER — Inpatient Hospital Stay: Payer: Medicaid Other

## 2021-03-21 VITALS — BP 114/75 | HR 80 | Temp 97.7°F | Resp 20 | Ht 64.0 in | Wt 217.9 lb

## 2021-03-21 DIAGNOSIS — Z79818 Long term (current) use of other agents affecting estrogen receptors and estrogen levels: Secondary | ICD-10-CM | POA: Diagnosis not present

## 2021-03-21 DIAGNOSIS — Z170 Estrogen receptor positive status [ER+]: Secondary | ICD-10-CM | POA: Diagnosis not present

## 2021-03-21 DIAGNOSIS — M810 Age-related osteoporosis without current pathological fracture: Secondary | ICD-10-CM | POA: Insufficient documentation

## 2021-03-21 DIAGNOSIS — Z79899 Other long term (current) drug therapy: Secondary | ICD-10-CM | POA: Diagnosis not present

## 2021-03-21 DIAGNOSIS — Z87891 Personal history of nicotine dependence: Secondary | ICD-10-CM | POA: Insufficient documentation

## 2021-03-21 DIAGNOSIS — Z923 Personal history of irradiation: Secondary | ICD-10-CM | POA: Diagnosis not present

## 2021-03-21 DIAGNOSIS — C50411 Malignant neoplasm of upper-outer quadrant of right female breast: Secondary | ICD-10-CM | POA: Insufficient documentation

## 2021-03-21 DIAGNOSIS — C773 Secondary and unspecified malignant neoplasm of axilla and upper limb lymph nodes: Secondary | ICD-10-CM | POA: Insufficient documentation

## 2021-03-21 DIAGNOSIS — C50919 Malignant neoplasm of unspecified site of unspecified female breast: Secondary | ICD-10-CM

## 2021-03-21 LAB — CBC WITH DIFFERENTIAL (CANCER CENTER ONLY)
Abs Immature Granulocytes: 0.01 10*3/uL (ref 0.00–0.07)
Basophils Absolute: 0 10*3/uL (ref 0.0–0.1)
Basophils Relative: 1 %
Eosinophils Absolute: 0.1 10*3/uL (ref 0.0–0.5)
Eosinophils Relative: 3 %
HCT: 37.3 % (ref 36.0–46.0)
Hemoglobin: 12.8 g/dL (ref 12.0–15.0)
Immature Granulocytes: 0 %
Lymphocytes Relative: 28 %
Lymphs Abs: 0.7 10*3/uL (ref 0.7–4.0)
MCH: 35.7 pg — ABNORMAL HIGH (ref 26.0–34.0)
MCHC: 34.3 g/dL (ref 30.0–36.0)
MCV: 103.9 fL — ABNORMAL HIGH (ref 80.0–100.0)
Monocytes Absolute: 0.1 10*3/uL (ref 0.1–1.0)
Monocytes Relative: 6 %
Neutro Abs: 1.5 10*3/uL — ABNORMAL LOW (ref 1.7–7.7)
Neutrophils Relative %: 62 %
Platelet Count: 325 10*3/uL (ref 150–400)
RBC: 3.59 MIL/uL — ABNORMAL LOW (ref 3.87–5.11)
RDW: 13.5 % (ref 11.5–15.5)
WBC Count: 2.4 10*3/uL — ABNORMAL LOW (ref 4.0–10.5)
nRBC: 0 % (ref 0.0–0.2)

## 2021-03-21 LAB — CMP (CANCER CENTER ONLY)
ALT: 7 U/L (ref 0–44)
AST: 13 U/L — ABNORMAL LOW (ref 15–41)
Albumin: 3.7 g/dL (ref 3.5–5.0)
Alkaline Phosphatase: 62 U/L (ref 38–126)
Anion gap: 9 (ref 5–15)
BUN: 11 mg/dL (ref 6–20)
CO2: 24 mmol/L (ref 22–32)
Calcium: 9 mg/dL (ref 8.9–10.3)
Chloride: 109 mmol/L (ref 98–111)
Creatinine: 0.92 mg/dL (ref 0.44–1.00)
GFR, Estimated: >60 mL/min (ref >60)
Glucose, Bld: 89 mg/dL (ref 70–99)
Potassium: 4.1 mmol/L (ref 3.5–5.1)
Sodium: 142 mmol/L (ref 135–145)
Total Bilirubin: 0.2 mg/dL — ABNORMAL LOW (ref 0.3–1.2)
Total Protein: 6.9 g/dL (ref 6.5–8.1)

## 2021-03-21 MED ORDER — FULVESTRANT 250 MG/5ML IM SOLN
INTRAMUSCULAR | Status: AC
  Filled 2021-03-21: qty 10

## 2021-03-21 MED ORDER — FULVESTRANT 250 MG/5ML IM SOLN
500.0000 mg | INTRAMUSCULAR | Status: DC
  Administered 2021-03-21: 500 mg via INTRAMUSCULAR

## 2021-03-21 NOTE — Patient Instructions (Signed)
Fulvestrant injection What is this medicine? FULVESTRANT (ful VES trant) blocks the effects of estrogen. It is used to treat breast cancer. This medicine may be used for other purposes; ask your health care provider or pharmacist if you have questions. COMMON BRAND NAME(S): FASLODEX What should I tell my health care provider before I take this medicine? They need to know if you have any of these conditions:  bleeding disorders  liver disease  low blood counts, like low white cell, platelet, or red cell counts  an unusual or allergic reaction to fulvestrant, other medicines, foods, dyes, or preservatives  pregnant or trying to get pregnant  breast-feeding How should I use this medicine? This medicine is for injection into a muscle. It is usually given by a health care professional in a hospital or clinic setting. Talk to your pediatrician regarding the use of this medicine in children. Special care may be needed. Overdosage: If you think you have taken too much of this medicine contact a poison control center or emergency room at once. NOTE: This medicine is only for you. Do not share this medicine with others. What if I miss a dose? It is important not to miss your dose. Call your doctor or health care professional if you are unable to keep an appointment. What may interact with this medicine?  medicines that treat or prevent blood clots like warfarin, enoxaparin, dalteparin, apixaban, dabigatran, and rivaroxaban This list may not describe all possible interactions. Give your health care provider a list of all the medicines, herbs, non-prescription drugs, or dietary supplements you use. Also tell them if you smoke, drink alcohol, or use illegal drugs. Some items may interact with your medicine. What should I watch for while using this medicine? Your condition will be monitored carefully while you are receiving this medicine. You will need important blood work done while you are taking  this medicine. Do not become pregnant while taking this medicine or for at least 1 year after stopping it. Women of child-bearing potential will need to have a negative pregnancy test before starting this medicine. Women should inform their doctor if they wish to become pregnant or think they might be pregnant. There is a potential for serious side effects to an unborn child. Men should inform their doctors if they wish to father a child. This medicine may lower sperm counts. Talk to your health care professional or pharmacist for more information. Do not breast-feed an infant while taking this medicine or for 1 year after the last dose. What side effects may I notice from receiving this medicine? Side effects that you should report to your doctor or health care professional as soon as possible:  allergic reactions like skin rash, itching or hives, swelling of the face, lips, or tongue  feeling faint or lightheaded, falls  pain, tingling, numbness, or weakness in the legs  signs and symptoms of infection like fever or chills; cough; flu-like symptoms; sore throat  vaginal bleeding Side effects that usually do not require medical attention (report to your doctor or health care professional if they continue or are bothersome):  aches, pains  constipation  diarrhea  headache  hot flashes  nausea, vomiting  pain at site where injected  stomach pain This list may not describe all possible side effects. Call your doctor for medical advice about side effects. You may report side effects to FDA at 1-800-FDA-1088. Where should I keep my medicine? This drug is given in a hospital or clinic and will   not be stored at home. NOTE: This sheet is a summary. It may not cover all possible information. If you have questions about this medicine, talk to your doctor, pharmacist, or health care provider.  2021 Elsevier/Gold Standard (2018-02-28 11:34:41)  

## 2021-03-22 LAB — CANCER ANTIGEN 27.29: CA 27.29: 126 U/mL — ABNORMAL HIGH (ref 0.0–38.6)

## 2021-04-13 ENCOUNTER — Ambulatory Visit (HOSPITAL_COMMUNITY): Payer: Medicaid Other

## 2021-04-18 ENCOUNTER — Ambulatory Visit: Payer: Medicaid Other | Admitting: Oncology

## 2021-04-18 ENCOUNTER — Other Ambulatory Visit: Payer: Medicaid Other

## 2021-04-18 ENCOUNTER — Other Ambulatory Visit: Payer: Self-pay

## 2021-04-18 ENCOUNTER — Inpatient Hospital Stay: Payer: Medicare Other

## 2021-04-18 ENCOUNTER — Other Ambulatory Visit: Payer: Self-pay | Admitting: *Deleted

## 2021-04-18 ENCOUNTER — Inpatient Hospital Stay: Payer: Medicare Other | Attending: Oncology

## 2021-04-18 VITALS — BP 98/75 | HR 95 | Temp 98.5°F | Resp 18

## 2021-04-18 DIAGNOSIS — C50411 Malignant neoplasm of upper-outer quadrant of right female breast: Secondary | ICD-10-CM | POA: Diagnosis not present

## 2021-04-18 DIAGNOSIS — Z79811 Long term (current) use of aromatase inhibitors: Secondary | ICD-10-CM | POA: Diagnosis not present

## 2021-04-18 DIAGNOSIS — M810 Age-related osteoporosis without current pathological fracture: Secondary | ICD-10-CM

## 2021-04-18 DIAGNOSIS — C50919 Malignant neoplasm of unspecified site of unspecified female breast: Secondary | ICD-10-CM

## 2021-04-18 DIAGNOSIS — C50911 Malignant neoplasm of unspecified site of right female breast: Secondary | ICD-10-CM

## 2021-04-18 DIAGNOSIS — C773 Secondary and unspecified malignant neoplasm of axilla and upper limb lymph nodes: Secondary | ICD-10-CM

## 2021-04-18 DIAGNOSIS — Z170 Estrogen receptor positive status [ER+]: Secondary | ICD-10-CM | POA: Diagnosis not present

## 2021-04-18 LAB — CBC WITH DIFFERENTIAL (CANCER CENTER ONLY)
Abs Immature Granulocytes: 0.01 10*3/uL (ref 0.00–0.07)
Basophils Absolute: 0 10*3/uL (ref 0.0–0.1)
Basophils Relative: 1 %
Eosinophils Absolute: 0 10*3/uL (ref 0.0–0.5)
Eosinophils Relative: 1 %
HCT: 37.7 % (ref 36.0–46.0)
Hemoglobin: 13.6 g/dL (ref 12.0–15.0)
Immature Granulocytes: 0 %
Lymphocytes Relative: 23 %
Lymphs Abs: 0.6 10*3/uL — ABNORMAL LOW (ref 0.7–4.0)
MCH: 36.2 pg — ABNORMAL HIGH (ref 26.0–34.0)
MCHC: 36.1 g/dL — ABNORMAL HIGH (ref 30.0–36.0)
MCV: 100.3 fL — ABNORMAL HIGH (ref 80.0–100.0)
Monocytes Absolute: 0.2 10*3/uL (ref 0.1–1.0)
Monocytes Relative: 5 %
Neutro Abs: 1.9 10*3/uL (ref 1.7–7.7)
Neutrophils Relative %: 70 %
Platelet Count: 351 10*3/uL (ref 150–400)
RBC: 3.76 MIL/uL — ABNORMAL LOW (ref 3.87–5.11)
RDW: 13.4 % (ref 11.5–15.5)
WBC Count: 2.8 10*3/uL — ABNORMAL LOW (ref 4.0–10.5)
nRBC: 0 % (ref 0.0–0.2)

## 2021-04-18 LAB — CMP (CANCER CENTER ONLY)
ALT: 7 U/L (ref 0–44)
AST: 14 U/L — ABNORMAL LOW (ref 15–41)
Albumin: 3.8 g/dL (ref 3.5–5.0)
Alkaline Phosphatase: 66 U/L (ref 38–126)
Anion gap: 10 (ref 5–15)
BUN: 9 mg/dL (ref 6–20)
CO2: 24 mmol/L (ref 22–32)
Calcium: 9.1 mg/dL (ref 8.9–10.3)
Chloride: 107 mmol/L (ref 98–111)
Creatinine: 0.9 mg/dL (ref 0.44–1.00)
GFR, Estimated: >60 mL/min (ref >60)
Glucose, Bld: 128 mg/dL — ABNORMAL HIGH (ref 70–99)
Potassium: 3.6 mmol/L (ref 3.5–5.1)
Sodium: 141 mmol/L (ref 135–145)
Total Bilirubin: 0.4 mg/dL (ref 0.3–1.2)
Total Protein: 7.1 g/dL (ref 6.5–8.1)

## 2021-04-18 MED ORDER — FULVESTRANT 250 MG/5ML IM SOLN
INTRAMUSCULAR | Status: AC
  Filled 2021-04-18: qty 10

## 2021-04-18 MED ORDER — FULVESTRANT 250 MG/5ML IM SOLN
500.0000 mg | INTRAMUSCULAR | Status: DC
  Administered 2021-04-18: 500 mg via INTRAMUSCULAR

## 2021-04-18 NOTE — Patient Instructions (Signed)
Fulvestrant injection What is this medicine? FULVESTRANT (ful VES trant) blocks the effects of estrogen. It is used to treat breast cancer. This medicine may be used for other purposes; ask your health care provider or pharmacist if you have questions. COMMON BRAND NAME(S): FASLODEX What should I tell my health care provider before I take this medicine? They need to know if you have any of these conditions:  bleeding disorders  liver disease  low blood counts, like low white cell, platelet, or red cell counts  an unusual or allergic reaction to fulvestrant, other medicines, foods, dyes, or preservatives  pregnant or trying to get pregnant  breast-feeding How should I use this medicine? This medicine is for injection into a muscle. It is usually given by a health care professional in a hospital or clinic setting. Talk to your pediatrician regarding the use of this medicine in children. Special care may be needed. Overdosage: If you think you have taken too much of this medicine contact a poison control center or emergency room at once. NOTE: This medicine is only for you. Do not share this medicine with others. What if I miss a dose? It is important not to miss your dose. Call your doctor or health care professional if you are unable to keep an appointment. What may interact with this medicine?  medicines that treat or prevent blood clots like warfarin, enoxaparin, dalteparin, apixaban, dabigatran, and rivaroxaban This list may not describe all possible interactions. Give your health care provider a list of all the medicines, herbs, non-prescription drugs, or dietary supplements you use. Also tell them if you smoke, drink alcohol, or use illegal drugs. Some items may interact with your medicine. What should I watch for while using this medicine? Your condition will be monitored carefully while you are receiving this medicine. You will need important blood work done while you are taking  this medicine. Do not become pregnant while taking this medicine or for at least 1 year after stopping it. Women of child-bearing potential will need to have a negative pregnancy test before starting this medicine. Women should inform their doctor if they wish to become pregnant or think they might be pregnant. There is a potential for serious side effects to an unborn child. Men should inform their doctors if they wish to father a child. This medicine may lower sperm counts. Talk to your health care professional or pharmacist for more information. Do not breast-feed an infant while taking this medicine or for 1 year after the last dose. What side effects may I notice from receiving this medicine? Side effects that you should report to your doctor or health care professional as soon as possible:  allergic reactions like skin rash, itching or hives, swelling of the face, lips, or tongue  feeling faint or lightheaded, falls  pain, tingling, numbness, or weakness in the legs  signs and symptoms of infection like fever or chills; cough; flu-like symptoms; sore throat  vaginal bleeding Side effects that usually do not require medical attention (report to your doctor or health care professional if they continue or are bothersome):  aches, pains  constipation  diarrhea  headache  hot flashes  nausea, vomiting  pain at site where injected  stomach pain This list may not describe all possible side effects. Call your doctor for medical advice about side effects. You may report side effects to FDA at 1-800-FDA-1088. Where should I keep my medicine? This drug is given in a hospital or clinic and will   not be stored at home. NOTE: This sheet is a summary. It may not cover all possible information. If you have questions about this medicine, talk to your doctor, pharmacist, or health care provider.  2021 Elsevier/Gold Standard (2018-02-28 11:34:41)  

## 2021-04-19 LAB — CANCER ANTIGEN 27.29: CA 27.29: 147.5 U/mL — ABNORMAL HIGH (ref 0.0–38.6)

## 2021-04-27 ENCOUNTER — Ambulatory Visit (HOSPITAL_COMMUNITY): Payer: Medicaid Other

## 2021-04-28 ENCOUNTER — Ambulatory Visit: Payer: Medicaid Other | Admitting: Adult Health

## 2021-04-28 ENCOUNTER — Telehealth: Payer: Self-pay | Admitting: Adult Health

## 2021-04-28 NOTE — Telephone Encounter (Signed)
R/s pt appts per 5/26 sch msg. Pt requested appts after 6/3 when she's doing her CT scan. R/s appts for next available after 6/3, pt aware.

## 2021-05-04 ENCOUNTER — Encounter: Payer: Self-pay | Admitting: Adult Health

## 2021-05-06 ENCOUNTER — Ambulatory Visit (HOSPITAL_COMMUNITY): Payer: Medicaid Other

## 2021-05-12 ENCOUNTER — Encounter (HOSPITAL_COMMUNITY): Payer: Self-pay

## 2021-05-12 ENCOUNTER — Ambulatory Visit (HOSPITAL_COMMUNITY)
Admission: RE | Admit: 2021-05-12 | Discharge: 2021-05-12 | Disposition: A | Discharge status: Home / Self Care | Payer: Medicaid Other | Source: Ambulatory Visit | Attending: Oncology | Admitting: Oncology

## 2021-05-12 ENCOUNTER — Other Ambulatory Visit: Payer: Self-pay

## 2021-05-12 DIAGNOSIS — Z170 Estrogen receptor positive status [ER+]: Secondary | ICD-10-CM | POA: Diagnosis present

## 2021-05-12 DIAGNOSIS — C50411 Malignant neoplasm of upper-outer quadrant of right female breast: Secondary | ICD-10-CM | POA: Diagnosis present

## 2021-05-12 MED ORDER — SODIUM CHLORIDE (PF) 0.9 % IJ SOLN
INTRAMUSCULAR | Status: AC
  Filled 2021-05-12: qty 50

## 2021-05-12 MED ORDER — IOHEXOL 300 MG/ML  SOLN
75.0000 mL | Freq: Once | INTRAMUSCULAR | Status: AC | PRN
  Administered 2021-05-12: 75 mL via INTRAVENOUS

## 2021-05-16 ENCOUNTER — Ambulatory Visit: Payer: Medicaid Other

## 2021-05-16 ENCOUNTER — Other Ambulatory Visit: Payer: Medicaid Other

## 2021-05-19 ENCOUNTER — Encounter: Payer: Self-pay | Admitting: Adult Health

## 2021-05-19 ENCOUNTER — Other Ambulatory Visit: Payer: Self-pay

## 2021-05-19 ENCOUNTER — Inpatient Hospital Stay: Payer: Medicaid Other | Attending: Oncology | Admitting: Adult Health

## 2021-05-19 ENCOUNTER — Inpatient Hospital Stay: Payer: Medicaid Other

## 2021-05-19 ENCOUNTER — Encounter: Payer: Self-pay | Admitting: Radiology

## 2021-05-19 VITALS — BP 126/83 | HR 79 | Temp 99.0°F | Resp 17

## 2021-05-19 VITALS — BP 122/94 | HR 81 | Temp 98.6°F | Resp 18 | Ht 64.0 in | Wt 214.0 lb

## 2021-05-19 DIAGNOSIS — Z170 Estrogen receptor positive status [ER+]: Secondary | ICD-10-CM | POA: Insufficient documentation

## 2021-05-19 DIAGNOSIS — Z006 Encounter for examination for normal comparison and control in clinical research program: Secondary | ICD-10-CM | POA: Diagnosis not present

## 2021-05-19 DIAGNOSIS — Z923 Personal history of irradiation: Secondary | ICD-10-CM | POA: Insufficient documentation

## 2021-05-19 DIAGNOSIS — Z9221 Personal history of antineoplastic chemotherapy: Secondary | ICD-10-CM | POA: Diagnosis not present

## 2021-05-19 DIAGNOSIS — C50411 Malignant neoplasm of upper-outer quadrant of right female breast: Secondary | ICD-10-CM | POA: Insufficient documentation

## 2021-05-19 DIAGNOSIS — C773 Secondary and unspecified malignant neoplasm of axilla and upper limb lymph nodes: Secondary | ICD-10-CM | POA: Diagnosis not present

## 2021-05-19 DIAGNOSIS — M810 Age-related osteoporosis without current pathological fracture: Secondary | ICD-10-CM

## 2021-05-19 DIAGNOSIS — C50919 Malignant neoplasm of unspecified site of unspecified female breast: Secondary | ICD-10-CM

## 2021-05-19 LAB — CBC WITH DIFFERENTIAL (CANCER CENTER ONLY)
Abs Immature Granulocytes: 0.02 10*3/uL (ref 0.00–0.07)
Basophils Absolute: 0 10*3/uL (ref 0.0–0.1)
Basophils Relative: 1 %
Eosinophils Absolute: 0.1 10*3/uL (ref 0.0–0.5)
Eosinophils Relative: 2 %
HCT: 38.7 % (ref 36.0–46.0)
Hemoglobin: 13.3 g/dL (ref 12.0–15.0)
Immature Granulocytes: 1 %
Lymphocytes Relative: 25 %
Lymphs Abs: 0.7 10*3/uL (ref 0.7–4.0)
MCH: 36.2 pg — ABNORMAL HIGH (ref 26.0–34.0)
MCHC: 34.4 g/dL (ref 30.0–36.0)
MCV: 105.4 fL — ABNORMAL HIGH (ref 80.0–100.0)
Monocytes Absolute: 0.2 10*3/uL (ref 0.1–1.0)
Monocytes Relative: 6 %
Neutro Abs: 1.9 10*3/uL (ref 1.7–7.7)
Neutrophils Relative %: 65 %
Platelet Count: 364 10*3/uL (ref 150–400)
RBC: 3.67 MIL/uL — ABNORMAL LOW (ref 3.87–5.11)
RDW: 12.9 % (ref 11.5–15.5)
WBC Count: 2.9 10*3/uL — ABNORMAL LOW (ref 4.0–10.5)
nRBC: 0 % (ref 0.0–0.2)

## 2021-05-19 LAB — CMP (CANCER CENTER ONLY)
ALT: 13 U/L (ref 0–44)
AST: 17 U/L (ref 15–41)
Albumin: 3.9 g/dL (ref 3.5–5.0)
Alkaline Phosphatase: 64 U/L (ref 38–126)
Anion gap: 7 (ref 5–15)
BUN: 17 mg/dL (ref 6–20)
CO2: 25 mmol/L (ref 22–32)
Calcium: 9.3 mg/dL (ref 8.9–10.3)
Chloride: 107 mmol/L (ref 98–111)
Creatinine: 1.14 mg/dL — ABNORMAL HIGH (ref 0.44–1.00)
GFR, Estimated: 55 mL/min — ABNORMAL LOW (ref >60)
Glucose, Bld: 102 mg/dL — ABNORMAL HIGH (ref 70–99)
Potassium: 4.1 mmol/L (ref 3.5–5.1)
Sodium: 139 mmol/L (ref 135–145)
Total Bilirubin: 0.3 mg/dL (ref 0.3–1.2)
Total Protein: 7.4 g/dL (ref 6.5–8.1)

## 2021-05-19 MED ORDER — FULVESTRANT 250 MG/5ML IM SOLN
INTRAMUSCULAR | Status: AC
  Filled 2021-05-19: qty 10

## 2021-05-19 MED ORDER — FULVESTRANT 250 MG/5ML IM SOLN
500.0000 mg | INTRAMUSCULAR | Status: DC
  Administered 2021-05-19: 500 mg via INTRAMUSCULAR

## 2021-05-19 MED ORDER — SODIUM CHLORIDE 0.9 % IV SOLN
INTRAVENOUS | Status: AC
  Filled 2021-05-19: qty 250

## 2021-05-19 MED ORDER — DENOSUMAB 60 MG/ML ~~LOC~~ SOSY
PREFILLED_SYRINGE | SUBCUTANEOUS | Status: AC
  Filled 2021-05-19: qty 1

## 2021-05-19 MED ORDER — DENOSUMAB 60 MG/ML ~~LOC~~ SOSY: 60.0000 mg | PREFILLED_SYRINGE | Freq: Once | SUBCUTANEOUS | Status: DC
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
## 2021-05-19 NOTE — Research (Signed)
[name]: PALBOCICLIB COLLABORATIVE ADJUVANT STUDY: A RANDOMIZED PHASE III TRIAL OF PALBOCICLIB WITH STANDARD ADJUVANT ENDOCRINE THERAPY VERSUS STANDARD ADJUVANT ENDOCRINE THERAPY ALONE FOR HORMONE RECEPTOR POSITIVE (HR+)/HUMAN EPIDERMAL GROWTH FACTOR RECEPTOR 2 (HER2)-NEGATIVE EARLY BREAST CANCER   05/19/2021   2:45PM  48 MONTH VISIT: Met with April [name] during routine visits. Introduced myself as a Scientist, physiological and informed patient reason for visit was to collect some information for the above mentioned study for her 48 month visit. Informed patient this is generally a phone call, but this was done in-person due to other appts patient had today. Collected required data for study and thanked patient for her time and continued participation in the above mentioned study. Informed patient next visit would be a phone call next year. Patient did not have any questions or concerns at this time.  Carol Ada, RT(R)(T) Clinical Research Coordinator

## 2021-05-20 ENCOUNTER — Encounter: Payer: Self-pay | Admitting: Adult Health

## 2021-05-20 ENCOUNTER — Telehealth: Payer: Self-pay

## 2021-05-20 LAB — CANCER ANTIGEN 27.29: CA 27.29: 145.6 U/mL — ABNORMAL HIGH (ref 0.0–38.6)

## 2021-05-20 NOTE — Telephone Encounter (Signed)
-----   Message from Gardenia Phlegm, NP sent at 05/20/2021  8:39 AM EDT ----- Please make sure patient is drinking enough water ----- Message ----- From: Interface, Lab In [place] Sent: 05/19/2021   2:25 PM EDT To: Gardenia Phlegm, NP

## 2021-05-20 NOTE — Telephone Encounter (Signed)
Pt given below information and encouraged to increased fluid intake. Pt verbalized thanks and understanding.

## 2021-05-30 ENCOUNTER — Telehealth: Payer: Self-pay | Admitting: *Deleted

## 2021-05-30 ENCOUNTER — Telehealth (HOSPITAL_COMMUNITY): Payer: Self-pay | Admitting: Pharmacist

## 2021-05-30 ENCOUNTER — Other Ambulatory Visit (HOSPITAL_COMMUNITY): Payer: Self-pay

## 2021-05-30 ENCOUNTER — Other Ambulatory Visit: Payer: Self-pay | Admitting: *Deleted

## 2021-05-30 MED ORDER — NIRMATRELVIR & RITONAVIR 10 X 150 MG & 10 X 100MG PO TBPK
2.0000 | ORAL_TABLET | Freq: Two times a day (BID) | ORAL | 0 refills | Status: AC
  Filled 2021-05-30: qty 20, 5d supply, fill #0

## 2021-05-30 NOTE — Telephone Encounter (Signed)
Pt called stating she has had increasing symptoms of " headache, sore throat, cough, hoarseness, chest tightness with development of mouth ulcers over the weekend "  She states she obtain MMW over the weekend per on call.  Per discussion of above symptoms this RN inquired if pt has been tested for Covid with pt stating no.  Note while on the phone - pt coughed with noted as dry and tight with higher pitch.  Pt denies any known fevers.  She states her daughter has similar symptoms as well.  Pt is currently on palbociclib study with faslodex.  Per discussion this RN recommended being Covid testing and noted rapid testing being done at the facility [facility] [place].  Pt will proceed to above and once resulted will call for further recommendations.

## 2021-05-30 NOTE — Telephone Encounter (Signed)
This RN followed up with pt per Covid testing result with pt stating " I am positive !" She states she has rapid and then due to being positive they also sent the PCR.  She states she sent her daughter there and she is negative.  Per review with MD- informed pt to hold Ibrance - she is currently on off week - and not restart until she completes the antivirals and contacts this office.  This RN informed her medication will be at the [facility] outpt pharmacy and she is to call when she is there- they will bring it out to her and review any interactions and answer her questions.  Pt verbalized understanding and states she has phone number.  This RN sent order for Paxlovid per review of renal clearance of <55 with appropriate renal dosing.

## 2021-05-30 NOTE — Telephone Encounter (Signed)
Spoke with patient.  She is currently in her 7 day off part of her Ibrance cycle.  Per L. Causey patient was told to hold Ibrance for 3 days after last dose of Paxlovid is taken.

## 2021-06-01 ENCOUNTER — Telehealth: Payer: Self-pay | Admitting: *Deleted

## 2021-06-01 ENCOUNTER — Other Ambulatory Visit (HOSPITAL_COMMUNITY): Payer: Self-pay

## 2021-06-01 MED ORDER — BENZONATATE 100 MG PO CAPS: 100.0000 mg | ORAL_CAPSULE | Freq: Three times a day (TID) | ORAL | 0 refills | Status: DC | PRN

## 2021-06-01 MED ORDER — AZITHROMYCIN 250 MG PO TABS: ORAL_TABLET | ORAL | 0 refills | Status: DC

## 2021-06-01 MED ORDER — METHYLPREDNISOLONE 4 MG PO TBPK: ORAL_TABLET | ORAL | 0 refills | Status: DC

## 2021-06-01 NOTE — Telephone Encounter (Signed)
This RN spoke with pt per her call stating ongoing symptoms since testing positive for Covid and starting Paxlovid " not worse just not better "  She states primary issue is " my throat is so sore - feels like when I had strep throat - so could I have strep too ?"  She states her cough - which is tight,croupy,nonproductive and " painful ".  She denies any fevers.  This RN spoke with pharmacist at the [facility] out pt pharmacy- he verified both amoxicillin and zithromax do not interact with the Paxlovid regimen.  He also states pts with above symptoms have benefited from use of a steroid dose pack.  Cough medications are at the prescribing MD discretion.  Per review with MD- orders obtained for Zpak,medrol dose pak, and tessalon perles.  This RN called and reviewed above with pt - verified pharmacy and prescriptions sent.

## 2021-06-09 ENCOUNTER — Telehealth: Payer: Self-pay | Admitting: Adult Health

## 2021-06-09 NOTE — Telephone Encounter (Signed)
R/s appts per 7/7 sch msg. Pt aware.  

## 2021-06-13 ENCOUNTER — Other Ambulatory Visit: Payer: Medicaid Other

## 2021-06-13 ENCOUNTER — Ambulatory Visit: Payer: Medicaid Other

## 2021-06-20 ENCOUNTER — Inpatient Hospital Stay: Payer: Medicaid Other | Attending: Oncology

## 2021-06-20 ENCOUNTER — Other Ambulatory Visit: Payer: Self-pay

## 2021-06-20 ENCOUNTER — Inpatient Hospital Stay: Payer: Medicaid Other

## 2021-06-20 VITALS — BP 120/86 | HR 86 | Temp 98.6°F | Resp 18

## 2021-06-20 DIAGNOSIS — C50411 Malignant neoplasm of upper-outer quadrant of right female breast: Secondary | ICD-10-CM | POA: Insufficient documentation

## 2021-06-20 DIAGNOSIS — J90 Pleural effusion, not elsewhere classified: Secondary | ICD-10-CM | POA: Diagnosis not present

## 2021-06-20 DIAGNOSIS — C773 Secondary and unspecified malignant neoplasm of axilla and upper limb lymph nodes: Secondary | ICD-10-CM | POA: Insufficient documentation

## 2021-06-20 DIAGNOSIS — Z170 Estrogen receptor positive status [ER+]: Secondary | ICD-10-CM | POA: Diagnosis not present

## 2021-06-20 DIAGNOSIS — M810 Age-related osteoporosis without current pathological fracture: Secondary | ICD-10-CM

## 2021-06-20 DIAGNOSIS — Z5111 Encounter for antineoplastic chemotherapy: Secondary | ICD-10-CM | POA: Insufficient documentation

## 2021-06-20 DIAGNOSIS — C50919 Malignant neoplasm of unspecified site of unspecified female breast: Secondary | ICD-10-CM

## 2021-06-20 DIAGNOSIS — Z87891 Personal history of nicotine dependence: Secondary | ICD-10-CM | POA: Insufficient documentation

## 2021-06-20 LAB — CMP (CANCER CENTER ONLY)
ALT: 9 U/L (ref 0–44)
AST: 11 U/L — ABNORMAL LOW (ref 15–41)
Albumin: 3.2 g/dL — ABNORMAL LOW (ref 3.5–5.0)
Alkaline Phosphatase: 77 U/L (ref 38–126)
Anion gap: 8 (ref 5–15)
BUN: 15 mg/dL (ref 6–20)
CO2: 26 mmol/L (ref 22–32)
Calcium: 9.3 mg/dL (ref 8.9–10.3)
Chloride: 107 mmol/L (ref 98–111)
Creatinine: 0.8 mg/dL (ref 0.44–1.00)
GFR, Estimated: >60 mL/min (ref >60)
Glucose, Bld: 89 mg/dL (ref 70–99)
Potassium: 4.2 mmol/L (ref 3.5–5.1)
Sodium: 141 mmol/L (ref 135–145)
Total Bilirubin: 0.4 mg/dL (ref 0.3–1.2)
Total Protein: 7 g/dL (ref 6.5–8.1)

## 2021-06-20 LAB — CBC WITH DIFFERENTIAL (CANCER CENTER ONLY)
Abs Immature Granulocytes: 0.02 10*3/uL (ref 0.00–0.07)
Basophils Absolute: 0 10*3/uL (ref 0.0–0.1)
Basophils Relative: 1 %
Eosinophils Absolute: 0.1 10*3/uL (ref 0.0–0.5)
Eosinophils Relative: 2 %
HCT: 36.7 % (ref 36.0–46.0)
Hemoglobin: 12.7 g/dL (ref 12.0–15.0)
Immature Granulocytes: 1 %
Lymphocytes Relative: 26 %
Lymphs Abs: 0.9 10*3/uL (ref 0.7–4.0)
MCH: 35.8 pg — ABNORMAL HIGH (ref 26.0–34.0)
MCHC: 34.6 g/dL (ref 30.0–36.0)
MCV: 103.4 fL — ABNORMAL HIGH (ref 80.0–100.0)
Monocytes Absolute: 0.2 10*3/uL (ref 0.1–1.0)
Monocytes Relative: 6 %
Neutro Abs: 2.1 10*3/uL (ref 1.7–7.7)
Neutrophils Relative %: 64 %
Platelet Count: 246 10*3/uL (ref 150–400)
RBC: 3.55 MIL/uL — ABNORMAL LOW (ref 3.87–5.11)
RDW: 13 % (ref 11.5–15.5)
WBC Count: 3.3 10*3/uL — ABNORMAL LOW (ref 4.0–10.5)
nRBC: 0 % (ref 0.0–0.2)

## 2021-06-20 MED ORDER — DENOSUMAB 60 MG/ML ~~LOC~~ SOSY
60.0000 mg | PREFILLED_SYRINGE | Freq: Once | SUBCUTANEOUS | Status: AC
Start: 2021-06-20 — End: 2021-06-20
  Administered 2021-06-20: 60 mg via SUBCUTANEOUS

## 2021-06-20 MED ORDER — FULVESTRANT 250 MG/5ML IM SOLN
INTRAMUSCULAR | Status: AC
  Filled 2021-06-20: qty 10

## 2021-06-20 MED ORDER — FULVESTRANT 250 MG/5ML IM SOLN
500.0000 mg | INTRAMUSCULAR | Status: DC
  Administered 2021-06-20: 500 mg via INTRAMUSCULAR

## 2021-06-20 MED ORDER — DENOSUMAB 60 MG/ML ~~LOC~~ SOSY
PREFILLED_SYRINGE | SUBCUTANEOUS | Status: AC
  Filled 2021-06-20: qty 1

## 2021-06-20 NOTE — Progress Notes (Signed)
Pt okay to get prolia inj today, has been 6 months per order.

## 2021-06-21 LAB — CANCER ANTIGEN 27.29: CA 27.29: 132.4 U/mL — ABNORMAL HIGH (ref 0.0–38.6)

## 2021-07-15 ENCOUNTER — Ambulatory Visit: Payer: Medicaid Other | Admitting: Adult Health

## 2021-07-15 ENCOUNTER — Other Ambulatory Visit: Payer: Medicaid Other

## 2021-07-15 ENCOUNTER — Ambulatory Visit: Payer: Medicaid Other

## 2021-07-22 ENCOUNTER — Inpatient Hospital Stay: Payer: Medicaid Other | Attending: Oncology

## 2021-07-22 ENCOUNTER — Encounter: Payer: Self-pay | Admitting: Adult Health

## 2021-07-22 ENCOUNTER — Inpatient Hospital Stay: Payer: Medicaid Other

## 2021-07-22 ENCOUNTER — Inpatient Hospital Stay (HOSPITAL_BASED_OUTPATIENT_CLINIC_OR_DEPARTMENT_OTHER): Payer: Medicaid Other | Admitting: Adult Health

## 2021-07-22 ENCOUNTER — Other Ambulatory Visit: Payer: Self-pay

## 2021-07-22 VITALS — BP 115/80 | HR 90 | Temp 97.9°F | Resp 18 | Ht 64.0 in | Wt 219.1 lb

## 2021-07-22 DIAGNOSIS — M810 Age-related osteoporosis without current pathological fracture: Secondary | ICD-10-CM

## 2021-07-22 DIAGNOSIS — Z5111 Encounter for antineoplastic chemotherapy: Secondary | ICD-10-CM | POA: Insufficient documentation

## 2021-07-22 DIAGNOSIS — C7951 Secondary malignant neoplasm of bone: Secondary | ICD-10-CM | POA: Diagnosis not present

## 2021-07-22 DIAGNOSIS — C50411 Malignant neoplasm of upper-outer quadrant of right female breast: Secondary | ICD-10-CM

## 2021-07-22 DIAGNOSIS — C787 Secondary malignant neoplasm of liver and intrahepatic bile duct: Secondary | ICD-10-CM | POA: Insufficient documentation

## 2021-07-22 DIAGNOSIS — C771 Secondary and unspecified malignant neoplasm of intrathoracic lymph nodes: Secondary | ICD-10-CM | POA: Diagnosis not present

## 2021-07-22 DIAGNOSIS — C773 Secondary and unspecified malignant neoplasm of axilla and upper limb lymph nodes: Secondary | ICD-10-CM | POA: Diagnosis not present

## 2021-07-22 DIAGNOSIS — Z170 Estrogen receptor positive status [ER+]: Secondary | ICD-10-CM | POA: Insufficient documentation

## 2021-07-22 LAB — CBC WITH DIFFERENTIAL (CANCER CENTER ONLY)
Abs Immature Granulocytes: 0.03 10*3/uL (ref 0.00–0.07)
Basophils Absolute: 0 10*3/uL (ref 0.0–0.1)
Basophils Relative: 1 %
Eosinophils Absolute: 0 10*3/uL (ref 0.0–0.5)
Eosinophils Relative: 1 %
HCT: 35.1 % — ABNORMAL LOW (ref 36.0–46.0)
Hemoglobin: 12.2 g/dL (ref 12.0–15.0)
Immature Granulocytes: 1 %
Lymphocytes Relative: 10 %
Lymphs Abs: 0.4 10*3/uL — ABNORMAL LOW (ref 0.7–4.0)
MCH: 36.4 pg — ABNORMAL HIGH (ref 26.0–34.0)
MCHC: 34.8 g/dL (ref 30.0–36.0)
MCV: 104.8 fL — ABNORMAL HIGH (ref 80.0–100.0)
Monocytes Absolute: 0.1 10*3/uL (ref 0.1–1.0)
Monocytes Relative: 3 %
Neutro Abs: 3 10*3/uL (ref 1.7–7.7)
Neutrophils Relative %: 84 %
Platelet Count: 260 10*3/uL (ref 150–400)
RBC: 3.35 MIL/uL — ABNORMAL LOW (ref 3.87–5.11)
RDW: 13.8 % (ref 11.5–15.5)
WBC Count: 3.6 10*3/uL — ABNORMAL LOW (ref 4.0–10.5)
nRBC: 0 % (ref 0.0–0.2)

## 2021-07-22 LAB — CMP (CANCER CENTER ONLY)
ALT: 11 U/L (ref 0–44)
AST: 15 U/L (ref 15–41)
Albumin: 3.5 g/dL (ref 3.5–5.0)
Alkaline Phosphatase: 70 U/L (ref 38–126)
Anion gap: 8 (ref 5–15)
BUN: 10 mg/dL (ref 6–20)
CO2: 25 mmol/L (ref 22–32)
Calcium: 8.7 mg/dL — ABNORMAL LOW (ref 8.9–10.3)
Chloride: 109 mmol/L (ref 98–111)
Creatinine: 0.99 mg/dL (ref 0.44–1.00)
GFR, Estimated: >60 mL/min (ref >60)
Glucose, Bld: 77 mg/dL (ref 70–99)
Potassium: 3.7 mmol/L (ref 3.5–5.1)
Sodium: 142 mmol/L (ref 135–145)
Total Bilirubin: 0.4 mg/dL (ref 0.3–1.2)
Total Protein: 6.6 g/dL (ref 6.5–8.1)

## 2021-07-22 MED ORDER — FULVESTRANT 250 MG/5ML IM SOLN
500.0000 mg | INTRAMUSCULAR | Status: DC
  Administered 2021-07-22: 500 mg via INTRAMUSCULAR
  Filled 2021-07-22: qty 10

## 2021-07-22 NOTE — Patient Instructions (Signed)
Fulvestrant injection What is this medication? FULVESTRANT (ful VES trant) blocks the effects of estrogen. It is used to treat breast cancer. This medicine may be used for other purposes; ask your health care provider or pharmacist if you have questions. COMMON BRAND NAME(S): FASLODEX What should I tell my care team before I take this medication? They need to know if you have any of these conditions: bleeding disorders liver disease low blood counts, like low white cell, platelet, or red cell counts an unusual or allergic reaction to fulvestrant, other medicines, foods, dyes, or preservatives pregnant or trying to get pregnant breast-feeding How should I use this medication? This medicine is for injection into a muscle. It is usually given by a health care professional in a hospital or clinic setting. Talk to your pediatrician regarding the use of this medicine in children. Special care may be needed. Overdosage: If you think you have taken too much of this medicine contact a poison control center or emergency room at once. NOTE: This medicine is only for you. Do not share this medicine with others. What if I miss a dose? It is important not to miss your dose. Call your doctor or health care professional if you are unable to keep an appointment. What may interact with this medication? medicines that treat or prevent blood clots like warfarin, enoxaparin, dalteparin, apixaban, dabigatran, and rivaroxaban This list may not describe all possible interactions. Give your health care provider a list of all the medicines, herbs, non-prescription drugs, or dietary supplements you use. Also tell them if you smoke, drink alcohol, or use illegal drugs. Some items may interact with your medicine. What should I watch for while using this medication? Your condition will be monitored carefully while you are receiving this medicine. You will need important blood work done while you are taking this  medicine. Do not become pregnant while taking this medicine or for at least 1 year after stopping it. Women of child-bearing potential will need to have a negative pregnancy test before starting this medicine. Women should inform their doctor if they wish to become pregnant or think they might be pregnant. There is a potential for serious side effects to an unborn child. Men should inform their doctors if they wish to father a child. This medicine may lower sperm counts. Talk to your health care professional or pharmacist for more information. Do not breast-feed an infant while taking this medicine or for 1 year after the last dose. What side effects may I notice from receiving this medication? Side effects that you should report to your doctor or health care professional as soon as possible: allergic reactions like skin rash, itching or hives, swelling of the face, lips, or tongue feeling faint or lightheaded, falls pain, tingling, numbness, or weakness in the legs signs and symptoms of infection like fever or chills; cough; flu-like symptoms; sore throat vaginal bleeding Side effects that usually do not require medical attention (report to your doctor or health care professional if they continue or are bothersome): aches, pains constipation diarrhea headache hot flashes nausea, vomiting pain at site where injected stomach pain This list may not describe all possible side effects. Call your doctor for medical advice about side effects. You may report side effects to FDA at 1-800-FDA-1088. Where should I keep my medication? This drug is given in a hospital or clinic and will not be stored at home. NOTE: This sheet is a summary. It may not cover all possible information. If you have   questions about this medicine, talk to your doctor, pharmacist, or health care provider.  2022 [employer] Standard (2018-02-28 11:34:41)  
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
## 2021-07-25 ENCOUNTER — Telehealth: Payer: Self-pay | Admitting: Oncology

## 2021-07-25 NOTE — Telephone Encounter (Signed)
Scheduled appts per 8/19 los. Pt aware.

## 2021-08-15 ENCOUNTER — Other Ambulatory Visit (HOSPITAL_COMMUNITY): Payer: Self-pay

## 2021-08-15 ENCOUNTER — Other Ambulatory Visit: Payer: Self-pay

## 2021-08-15 ENCOUNTER — Ambulatory Visit (HOSPITAL_COMMUNITY)
Admission: RE | Admit: 2021-08-15 | Discharge: 2021-08-15 | Disposition: A | Discharge status: Home / Self Care | Payer: Medicaid Other | Source: Ambulatory Visit | Attending: Adult Health | Admitting: Adult Health

## 2021-08-15 DIAGNOSIS — Z170 Estrogen receptor positive status [ER+]: Secondary | ICD-10-CM | POA: Insufficient documentation

## 2021-08-15 DIAGNOSIS — C50411 Malignant neoplasm of upper-outer quadrant of right female breast: Secondary | ICD-10-CM | POA: Diagnosis present

## 2021-08-15 MED ORDER — IOHEXOL 350 MG/ML SOLN
60.0000 mL | Freq: Once | INTRAVENOUS | Status: AC | PRN
  Administered 2021-08-15: 60 mL via INTRAVENOUS

## 2021-08-19 ENCOUNTER — Other Ambulatory Visit: Payer: Medicaid Other

## 2021-08-19 ENCOUNTER — Ambulatory Visit: Payer: Medicaid Other

## 2021-08-23 ENCOUNTER — Encounter: Payer: Self-pay | Admitting: Adult Health

## 2021-08-23 ENCOUNTER — Inpatient Hospital Stay: Payer: Medicaid Other | Attending: Oncology

## 2021-08-23 ENCOUNTER — Other Ambulatory Visit: Payer: Self-pay

## 2021-08-23 ENCOUNTER — Inpatient Hospital Stay (HOSPITAL_BASED_OUTPATIENT_CLINIC_OR_DEPARTMENT_OTHER): Payer: Medicaid Other | Admitting: Adult Health

## 2021-08-23 ENCOUNTER — Inpatient Hospital Stay: Payer: Medicaid Other

## 2021-08-23 VITALS — BP 168/74 | HR 95 | Temp 97.5°F | Resp 18 | Ht 64.0 in | Wt 220.8 lb

## 2021-08-23 DIAGNOSIS — Z8052 Family history of malignant neoplasm of bladder: Secondary | ICD-10-CM | POA: Insufficient documentation

## 2021-08-23 DIAGNOSIS — C50411 Malignant neoplasm of upper-outer quadrant of right female breast: Secondary | ICD-10-CM

## 2021-08-23 DIAGNOSIS — C50911 Malignant neoplasm of unspecified site of right female breast: Secondary | ICD-10-CM

## 2021-08-23 DIAGNOSIS — Z170 Estrogen receptor positive status [ER+]: Secondary | ICD-10-CM | POA: Diagnosis not present

## 2021-08-23 DIAGNOSIS — C773 Secondary and unspecified malignant neoplasm of axilla and upper limb lymph nodes: Secondary | ICD-10-CM

## 2021-08-23 DIAGNOSIS — Z87891 Personal history of nicotine dependence: Secondary | ICD-10-CM | POA: Insufficient documentation

## 2021-08-23 DIAGNOSIS — C771 Secondary and unspecified malignant neoplasm of intrathoracic lymph nodes: Secondary | ICD-10-CM | POA: Insufficient documentation

## 2021-08-23 DIAGNOSIS — M810 Age-related osteoporosis without current pathological fracture: Secondary | ICD-10-CM

## 2021-08-23 LAB — CMP (CANCER CENTER ONLY)
ALT: 7 U/L (ref 0–44)
AST: 14 U/L — ABNORMAL LOW (ref 15–41)
Albumin: 3.4 g/dL — ABNORMAL LOW (ref 3.5–5.0)
Alkaline Phosphatase: 55 U/L (ref 38–126)
Anion gap: 6 (ref 5–15)
BUN: 9 mg/dL (ref 6–20)
CO2: 24 mmol/L (ref 22–32)
Calcium: 8.6 mg/dL — ABNORMAL LOW (ref 8.9–10.3)
Chloride: 111 mmol/L (ref 98–111)
Creatinine: 0.92 mg/dL (ref 0.44–1.00)
GFR, Estimated: >60 mL/min (ref >60)
Glucose, Bld: 78 mg/dL (ref 70–99)
Potassium: 3.7 mmol/L (ref 3.5–5.1)
Sodium: 141 mmol/L (ref 135–145)
Total Bilirubin: 0.3 mg/dL (ref 0.3–1.2)
Total Protein: 6.2 g/dL — ABNORMAL LOW (ref 6.5–8.1)

## 2021-08-23 LAB — CBC WITH DIFFERENTIAL (CANCER CENTER ONLY)
Abs Immature Granulocytes: 0 10*3/uL (ref 0.00–0.07)
Basophils Absolute: 0 10*3/uL (ref 0.0–0.1)
Basophils Relative: 2 %
Eosinophils Absolute: 0.1 10*3/uL (ref 0.0–0.5)
Eosinophils Relative: 3 %
HCT: 34.6 % — ABNORMAL LOW (ref 36.0–46.0)
Hemoglobin: 11.9 g/dL — ABNORMAL LOW (ref 12.0–15.0)
Immature Granulocytes: 0 %
Lymphocytes Relative: 27 %
Lymphs Abs: 0.5 10*3/uL — ABNORMAL LOW (ref 0.7–4.0)
MCH: 36.2 pg — ABNORMAL HIGH (ref 26.0–34.0)
MCHC: 34.4 g/dL (ref 30.0–36.0)
MCV: 105.2 fL — ABNORMAL HIGH (ref 80.0–100.0)
Monocytes Absolute: 0.2 10*3/uL (ref 0.1–1.0)
Monocytes Relative: 9 %
Neutro Abs: 1.1 10*3/uL — ABNORMAL LOW (ref 1.7–7.7)
Neutrophils Relative %: 59 %
Platelet Count: 153 10*3/uL (ref 150–400)
RBC: 3.29 MIL/uL — ABNORMAL LOW (ref 3.87–5.11)
RDW: 13.4 % (ref 11.5–15.5)
WBC Count: 1.9 10*3/uL — ABNORMAL LOW (ref 4.0–10.5)
nRBC: 0 % (ref 0.0–0.2)

## 2021-08-23 MED ORDER — FULVESTRANT 250 MG/5ML IM SOLN
500.0000 mg | INTRAMUSCULAR | Status: DC
  Administered 2021-08-23: 500 mg via INTRAMUSCULAR
  Filled 2021-08-23: qty 10

## 2021-08-23 NOTE — Patient Instructions (Signed)
Fulvestrant injection What is this medication? FULVESTRANT (ful VES trant) blocks the effects of estrogen. It is used to treat breast cancer. This medicine may be used for other purposes; ask your health care provider or pharmacist if you have questions. COMMON BRAND NAME(S): FASLODEX What should I tell my care team before I take this medication? They need to know if you have any of these conditions: bleeding disorders liver disease low blood counts, like low white cell, platelet, or red cell counts an unusual or allergic reaction to fulvestrant, other medicines, foods, dyes, or preservatives pregnant or trying to get pregnant breast-feeding How should I use this medication? This medicine is for injection into a muscle. It is usually given by a health care professional in a hospital or clinic setting. Talk to your pediatrician regarding the use of this medicine in children. Special care may be needed. Overdosage: If you think you have taken too much of this medicine contact a poison control center or emergency room at once. NOTE: This medicine is only for you. Do not share this medicine with others. What if I miss a dose? It is important not to miss your dose. Call your doctor or health care professional if you are unable to keep an appointment. What may interact with this medication? medicines that treat or prevent blood clots like warfarin, enoxaparin, dalteparin, apixaban, dabigatran, and rivaroxaban This list may not describe all possible interactions. Give your health care provider a list of all the medicines, herbs, non-prescription drugs, or dietary supplements you use. Also tell them if you smoke, drink alcohol, or use illegal drugs. Some items may interact with your medicine. What should I watch for while using this medication? Your condition will be monitored carefully while you are receiving this medicine. You will need important blood work done while you are taking this  medicine. Do not become pregnant while taking this medicine or for at least 1 year after stopping it. Women of child-bearing potential will need to have a negative pregnancy test before starting this medicine. Women should inform their doctor if they wish to become pregnant or think they might be pregnant. There is a potential for serious side effects to an unborn child. Men should inform their doctors if they wish to father a child. This medicine may lower sperm counts. Talk to your health care professional or pharmacist for more information. Do not breast-feed an infant while taking this medicine or for 1 year after the last dose. What side effects may I notice from receiving this medication? Side effects that you should report to your doctor or health care professional as soon as possible: allergic reactions like skin rash, itching or hives, swelling of the face, lips, or tongue feeling faint or lightheaded, falls pain, tingling, numbness, or weakness in the legs signs and symptoms of infection like fever or chills; cough; flu-like symptoms; sore throat vaginal bleeding Side effects that usually do not require medical attention (report to your doctor or health care professional if they continue or are bothersome): aches, pains constipation diarrhea headache hot flashes nausea, vomiting pain at site where injected stomach pain This list may not describe all possible side effects. Call your doctor for medical advice about side effects. You may report side effects to FDA at 1-800-FDA-1088. Where should I keep my medication? This drug is given in a hospital or clinic and will not be stored at home. NOTE: This sheet is a summary. It may not cover all possible information. If you have   questions about this medicine, talk to your doctor, pharmacist, or health care provider.  2022 [employer] Standard (2018-02-28 11:34:41)  
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
## 2021-08-24 ENCOUNTER — Encounter: Payer: Self-pay | Admitting: Adult Health

## 2021-08-29 ENCOUNTER — Other Ambulatory Visit: Payer: Self-pay | Admitting: *Deleted

## 2021-08-29 DIAGNOSIS — C50919 Malignant neoplasm of unspecified site of unspecified female breast: Secondary | ICD-10-CM

## 2021-08-29 MED ORDER — PALBOCICLIB 75 MG PO CAPS: 75.0000 mg | ORAL_CAPSULE | Freq: Every day | ORAL | 6 refills | Status: DC

## 2021-09-06 ENCOUNTER — Encounter: Payer: Self-pay | Admitting: Adult Health

## 2021-09-08 ENCOUNTER — Encounter: Payer: Self-pay | Admitting: Adult Health

## 2021-09-14 ENCOUNTER — Other Ambulatory Visit: Payer: Self-pay

## 2021-09-14 ENCOUNTER — Ambulatory Visit (HOSPITAL_COMMUNITY)
Admission: RE | Admit: 2021-09-14 | Discharge: 2021-09-14 | Disposition: A | Discharge status: Home / Self Care | Payer: Medicare Other | Source: Ambulatory Visit | Attending: Adult Health | Admitting: Adult Health

## 2021-09-14 DIAGNOSIS — Z170 Estrogen receptor positive status [ER+]: Secondary | ICD-10-CM | POA: Insufficient documentation

## 2021-09-14 DIAGNOSIS — C773 Secondary and unspecified malignant neoplasm of axilla and upper limb lymph nodes: Secondary | ICD-10-CM | POA: Insufficient documentation

## 2021-09-14 DIAGNOSIS — C50411 Malignant neoplasm of upper-outer quadrant of right female breast: Secondary | ICD-10-CM | POA: Insufficient documentation

## 2021-09-14 DIAGNOSIS — C50911 Malignant neoplasm of unspecified site of right female breast: Secondary | ICD-10-CM | POA: Diagnosis present

## 2021-09-14 LAB — GLUCOSE, CAPILLARY: Glucose-Capillary: 104 mg/dL — ABNORMAL HIGH (ref 70–99)

## 2021-09-14 MED ORDER — TECHNETIUM TC 99M SULFUR COLLOID FILTERED
11.0000 | Freq: Once | INTRAVENOUS | Status: AC | PRN
  Administered 2021-09-14: 11.02 via INTRADERMAL
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
## 2021-09-15 ENCOUNTER — Inpatient Hospital Stay: Payer: Medicare Other

## 2021-09-15 ENCOUNTER — Inpatient Hospital Stay: Payer: Medicare Other | Attending: Oncology

## 2021-09-15 ENCOUNTER — Inpatient Hospital Stay (HOSPITAL_BASED_OUTPATIENT_CLINIC_OR_DEPARTMENT_OTHER): Payer: Medicare Other | Admitting: Oncology

## 2021-09-15 VITALS — BP 112/75 | HR 77 | Temp 97.9°F | Resp 16 | Ht 64.0 in | Wt 217.1 lb

## 2021-09-15 DIAGNOSIS — C50911 Malignant neoplasm of unspecified site of right female breast: Secondary | ICD-10-CM

## 2021-09-15 DIAGNOSIS — Z170 Estrogen receptor positive status [ER+]: Secondary | ICD-10-CM

## 2021-09-15 DIAGNOSIS — C773 Secondary and unspecified malignant neoplasm of axilla and upper limb lymph nodes: Secondary | ICD-10-CM

## 2021-09-15 DIAGNOSIS — M810 Age-related osteoporosis without current pathological fracture: Secondary | ICD-10-CM

## 2021-09-15 DIAGNOSIS — Z5111 Encounter for antineoplastic chemotherapy: Secondary | ICD-10-CM | POA: Diagnosis present

## 2021-09-15 DIAGNOSIS — C50411 Malignant neoplasm of upper-outer quadrant of right female breast: Secondary | ICD-10-CM | POA: Diagnosis present

## 2021-09-15 DIAGNOSIS — Z79818 Long term (current) use of other agents affecting estrogen receptors and estrogen levels: Secondary | ICD-10-CM | POA: Diagnosis not present

## 2021-09-15 DIAGNOSIS — T451X5A Adverse effect of antineoplastic and immunosuppressive drugs, initial encounter: Secondary | ICD-10-CM | POA: Diagnosis not present

## 2021-09-15 DIAGNOSIS — G620 Drug-induced polyneuropathy: Secondary | ICD-10-CM

## 2021-09-15 DIAGNOSIS — M797 Fibromyalgia: Secondary | ICD-10-CM

## 2021-09-15 DIAGNOSIS — M818 Other osteoporosis without current pathological fracture: Secondary | ICD-10-CM

## 2021-09-15 LAB — CBC WITH DIFFERENTIAL (CANCER CENTER ONLY)
Abs Immature Granulocytes: 0.01 10*3/uL (ref 0.00–0.07)
Basophils Absolute: 0 10*3/uL (ref 0.0–0.1)
Basophils Relative: 1 %
Eosinophils Absolute: 0.1 10*3/uL (ref 0.0–0.5)
Eosinophils Relative: 4 %
HCT: 36 % (ref 36.0–46.0)
Hemoglobin: 12.4 g/dL (ref 12.0–15.0)
Immature Granulocytes: 1 %
Lymphocytes Relative: 32 %
Lymphs Abs: 0.6 10*3/uL — ABNORMAL LOW (ref 0.7–4.0)
MCH: 35.5 pg — ABNORMAL HIGH (ref 26.0–34.0)
MCHC: 34.4 g/dL (ref 30.0–36.0)
MCV: 103.2 fL — ABNORMAL HIGH (ref 80.0–100.0)
Monocytes Absolute: 0.1 10*3/uL (ref 0.1–1.0)
Monocytes Relative: 7 %
Neutro Abs: 1.1 10*3/uL — ABNORMAL LOW (ref 1.7–7.7)
Neutrophils Relative %: 55 %
Platelet Count: 313 10*3/uL (ref 150–400)
RBC: 3.49 MIL/uL — ABNORMAL LOW (ref 3.87–5.11)
RDW: 12.8 % (ref 11.5–15.5)
WBC Count: 1.9 10*3/uL — ABNORMAL LOW (ref 4.0–10.5)
nRBC: 0 % (ref 0.0–0.2)

## 2021-09-15 LAB — CMP (CANCER CENTER ONLY)
ALT: 11 U/L (ref 0–44)
AST: 14 U/L — ABNORMAL LOW (ref 15–41)
Albumin: 3.7 g/dL (ref 3.5–5.0)
Alkaline Phosphatase: 57 U/L (ref 38–126)
Anion gap: 5 (ref 5–15)
BUN: 13 mg/dL (ref 6–20)
CO2: 27 mmol/L (ref 22–32)
Calcium: 9.1 mg/dL (ref 8.9–10.3)
Chloride: 107 mmol/L (ref 98–111)
Creatinine: 0.88 mg/dL (ref 0.44–1.00)
GFR, Estimated: >60 mL/min (ref >60)
Glucose, Bld: 95 mg/dL (ref 70–99)
Potassium: 4 mmol/L (ref 3.5–5.1)
Sodium: 139 mmol/L (ref 135–145)
Total Bilirubin: 0.5 mg/dL (ref 0.3–1.2)
Total Protein: 6.8 g/dL (ref 6.5–8.1)

## 2021-09-15 MED ORDER — FULVESTRANT 250 MG/5ML IM SOLN
500.0000 mg | INTRAMUSCULAR | Status: DC
  Administered 2021-09-15: 500 mg via INTRAMUSCULAR
  Filled 2021-09-15: qty 10

## 2021-09-15 NOTE — Patient Instructions (Signed)
Fulvestrant injection What is this medication? FULVESTRANT (ful VES trant) blocks the effects of estrogen. It is used to treat breast cancer. This medicine may be used for other purposes; ask your health care provider or pharmacist if you have questions. COMMON BRAND NAME(S): FASLODEX What should I tell my care team before I take this medication? They need to know if you have any of these conditions: bleeding disorders liver disease low blood counts, like low white cell, platelet, or red cell counts an unusual or allergic reaction to fulvestrant, other medicines, foods, dyes, or preservatives pregnant or trying to get pregnant breast-feeding How should I use this medication? This medicine is for injection into a muscle. It is usually given by a health care professional in a hospital or clinic setting. Talk to your pediatrician regarding the use of this medicine in children. Special care may be needed. Overdosage: If you think you have taken too much of this medicine contact a poison control center or emergency room at once. NOTE: This medicine is only for you. Do not share this medicine with others. What if I miss a dose? It is important not to miss your dose. Call your doctor or health care professional if you are unable to keep an appointment. What may interact with this medication? medicines that treat or prevent blood clots like warfarin, enoxaparin, dalteparin, apixaban, dabigatran, and rivaroxaban This list may not describe all possible interactions. Give your health care provider a list of all the medicines, herbs, non-prescription drugs, or dietary supplements you use. Also tell them if you smoke, drink alcohol, or use illegal drugs. Some items may interact with your medicine. What should I watch for while using this medication? Your condition will be monitored carefully while you are receiving this medicine. You will need important blood work done while you are taking this  medicine. Do not become pregnant while taking this medicine or for at least 1 year after stopping it. Women of child-bearing potential will need to have a negative pregnancy test before starting this medicine. Women should inform their doctor if they wish to become pregnant or think they might be pregnant. There is a potential for serious side effects to an unborn child. Men should inform their doctors if they wish to father a child. This medicine may lower sperm counts. Talk to your health care professional or pharmacist for more information. Do not breast-feed an infant while taking this medicine or for 1 year after the last dose. What side effects may I notice from receiving this medication? Side effects that you should report to your doctor or health care professional as soon as possible: allergic reactions like skin rash, itching or hives, swelling of the face, lips, or tongue feeling faint or lightheaded, falls pain, tingling, numbness, or weakness in the legs signs and symptoms of infection like fever or chills; cough; flu-like symptoms; sore throat vaginal bleeding Side effects that usually do not require medical attention (report to your doctor or health care professional if they continue or are bothersome): aches, pains constipation diarrhea headache hot flashes nausea, vomiting pain at site where injected stomach pain This list may not describe all possible side effects. Call your doctor for medical advice about side effects. You may report side effects to FDA at 1-800-FDA-1088. Where should I keep my medication? This drug is given in a hospital or clinic and will not be stored at home. NOTE: This sheet is a summary. It may not cover all possible information. If you have   questions about this medicine, talk to your doctor, pharmacist, or health care provider.  2022 [employer] Standard (2018-02-28 11:34:41)  

## 2021-09-19 ENCOUNTER — Other Ambulatory Visit: Payer: Self-pay | Admitting: Oncology

## 2021-09-19 ENCOUNTER — Encounter: Payer: Self-pay | Admitting: Adult Health

## 2021-09-19 DIAGNOSIS — Z170 Estrogen receptor positive status [ER+]: Secondary | ICD-10-CM

## 2021-09-19 DIAGNOSIS — C50411 Malignant neoplasm of upper-outer quadrant of right female breast: Secondary | ICD-10-CM

## 2021-09-19 NOTE — Progress Notes (Signed)
I called April [name] with the results of the PET scan.  I suggested we obtain a biopsy of one of the liver lesions if possible so that we can repeat a prognostic panel and also obtain molecular studies.  She is in agreement.  The order is being placed.

## 2021-09-20 ENCOUNTER — Encounter (HOSPITAL_COMMUNITY): Payer: Self-pay

## 2021-09-20 NOTE — Progress Notes (Signed)
[name] Legal Sex  Female DOB  [birth year] SSN  [id number] Address  [place] [address] Phone  [phone] [facility])  [phone] (Mobile)    RE: US BIOPSY (LIVER) Received: Scarlette Ar, MD  Valli Glance Approved for ultrasound guided liver mass biopsy.  Recommend contrast enhanced ultrasound for improved diagnostic yield/visualization.   Dylan        Previous Messages   ----- Message -----  From: Valli Glance  Sent: 09/19/2021   2:21 PM EDT  To: [facility] Procedure Requests  Subject: US BIOPSY (LIVER)                               US BIOPSY (LIVER)    Reason: Malignant neoplasm of upper-outer quadrant of right breast in female, estrogen receptor positive, Confirm metastatsis and obtain molecular studies     History: [name], CT and PET in computer     Provider: Chauncey Cruel    Contact: [phone]

## 2021-09-21 ENCOUNTER — Encounter: Payer: Self-pay | Admitting: Adult Health

## 2021-09-27 ENCOUNTER — Encounter: Payer: Self-pay | Admitting: Adult Health

## 2021-09-27 ENCOUNTER — Ambulatory Visit
Admission: RE | Admit: 2021-09-27 | Discharge: 2021-09-27 | Disposition: A | Discharge status: Home / Self Care | Payer: Medicaid Other | Source: Ambulatory Visit | Attending: Adult Health | Admitting: Adult Health

## 2021-09-27 ENCOUNTER — Ambulatory Visit
Admission: RE | Admit: 2021-09-27 | Discharge: 2021-09-27 | Disposition: A | Discharge status: Home / Self Care | Payer: Medicare Other | Source: Ambulatory Visit | Attending: Adult Health | Admitting: Adult Health

## 2021-09-27 ENCOUNTER — Other Ambulatory Visit: Payer: Self-pay

## 2021-09-27 DIAGNOSIS — C50911 Malignant neoplasm of unspecified site of right female breast: Secondary | ICD-10-CM

## 2021-09-27 DIAGNOSIS — Z170 Estrogen receptor positive status [ER+]: Secondary | ICD-10-CM

## 2021-09-27 DIAGNOSIS — C50411 Malignant neoplasm of upper-outer quadrant of right female breast: Secondary | ICD-10-CM

## 2021-09-27 DIAGNOSIS — C773 Secondary and unspecified malignant neoplasm of axilla and upper limb lymph nodes: Secondary | ICD-10-CM

## 2021-09-27 NOTE — Telephone Encounter (Signed)
No entry 

## 2021-09-29 DIAGNOSIS — H5213 Myopia, bilateral: Secondary | ICD-10-CM | POA: Diagnosis not present

## 2021-10-04 ENCOUNTER — Other Ambulatory Visit: Payer: Self-pay | Admitting: Radiology

## 2021-10-04 ENCOUNTER — Other Ambulatory Visit: Payer: Self-pay | Admitting: Internal Medicine

## 2021-10-04 ENCOUNTER — Telehealth: Payer: Self-pay

## 2021-10-04 NOTE — Telephone Encounter (Signed)
Oral Oncology Patient Advocate Encounter  Met patient in [place] to complete re-enrollment application for [facility] in an effort to reduce patient's out of pocket expense for [facility] to $0.    Application completed and faxed to [phone].   [employer] patient assistance phone number for follow up is [phone].   This encounter will be updated until final determination.   [place] Patient Carter Phone [phone] Fax [phone] 10/04/2021 3:54 PM

## 2021-10-05 ENCOUNTER — Ambulatory Visit (HOSPITAL_COMMUNITY)
Admission: RE | Admit: 2021-10-05 | Discharge: 2021-10-05 | Disposition: A | Discharge status: Home / Self Care | Payer: Medicaid Other | Source: Ambulatory Visit | Attending: Oncology | Admitting: Oncology

## 2021-10-05 ENCOUNTER — Encounter (HOSPITAL_COMMUNITY): Payer: Self-pay

## 2021-10-05 ENCOUNTER — Other Ambulatory Visit: Payer: Self-pay

## 2021-10-05 DIAGNOSIS — Z7983 Long term (current) use of bisphosphonates: Secondary | ICD-10-CM | POA: Diagnosis not present

## 2021-10-05 DIAGNOSIS — F32A Depression, unspecified: Secondary | ICD-10-CM | POA: Insufficient documentation

## 2021-10-05 DIAGNOSIS — Z170 Estrogen receptor positive status [ER+]: Secondary | ICD-10-CM | POA: Diagnosis not present

## 2021-10-05 DIAGNOSIS — G43909 Migraine, unspecified, not intractable, without status migrainosus: Secondary | ICD-10-CM | POA: Diagnosis not present

## 2021-10-05 DIAGNOSIS — K219 Gastro-esophageal reflux disease without esophagitis: Secondary | ICD-10-CM | POA: Insufficient documentation

## 2021-10-05 DIAGNOSIS — Z79899 Other long term (current) drug therapy: Secondary | ICD-10-CM | POA: Insufficient documentation

## 2021-10-05 DIAGNOSIS — Z87891 Personal history of nicotine dependence: Secondary | ICD-10-CM | POA: Diagnosis not present

## 2021-10-05 DIAGNOSIS — E7800 Pure hypercholesterolemia, unspecified: Secondary | ICD-10-CM | POA: Diagnosis not present

## 2021-10-05 DIAGNOSIS — C50411 Malignant neoplasm of upper-outer quadrant of right female breast: Secondary | ICD-10-CM | POA: Diagnosis not present

## 2021-10-05 DIAGNOSIS — C787 Secondary malignant neoplasm of liver and intrahepatic bile duct: Secondary | ICD-10-CM | POA: Insufficient documentation

## 2021-10-05 DIAGNOSIS — F419 Anxiety disorder, unspecified: Secondary | ICD-10-CM | POA: Insufficient documentation

## 2021-10-05 DIAGNOSIS — M797 Fibromyalgia: Secondary | ICD-10-CM | POA: Diagnosis not present

## 2021-10-05 DIAGNOSIS — R160 Hepatomegaly, not elsewhere classified: Secondary | ICD-10-CM | POA: Diagnosis present

## 2021-10-05 LAB — CBC
HCT: 36.8 % (ref 36.0–46.0)
Hemoglobin: 12.7 g/dL (ref 12.0–15.0)
MCH: 35.9 pg — ABNORMAL HIGH (ref 26.0–34.0)
MCHC: 34.5 g/dL (ref 30.0–36.0)
MCV: 104 fL — ABNORMAL HIGH (ref 80.0–100.0)
Platelets: 262 10*3/uL (ref 150–400)
RBC: 3.54 MIL/uL — ABNORMAL LOW (ref 3.87–5.11)
RDW: 13.1 % (ref 11.5–15.5)
WBC: 2.1 10*3/uL — ABNORMAL LOW (ref 4.0–10.5)
nRBC: 0 % (ref 0.0–0.2)

## 2021-10-05 LAB — PROTIME-INR
INR: 1 (ref 0.8–1.2)
Prothrombin Time: 13.2 seconds (ref 11.4–15.2)

## 2021-10-05 MED ORDER — GELATIN ABSORBABLE 12-7 MM EX MISC
CUTANEOUS | Status: AC
  Filled 2021-10-05: qty 1

## 2021-10-05 MED ORDER — FENTANYL CITRATE (PF) 100 MCG/2ML IJ SOLN
INTRAMUSCULAR | Status: AC | PRN
  Administered 2021-10-05: 25 ug via INTRAVENOUS

## 2021-10-05 MED ORDER — SODIUM CHLORIDE 0.9 % IV SOLN
INTRAVENOUS | Status: DC
  Administered 2021-10-05: 10 mL/h via INTRAVENOUS

## 2021-10-05 MED ORDER — FENTANYL CITRATE (PF) 100 MCG/2ML IJ SOLN
INTRAMUSCULAR | Status: AC
  Filled 2021-10-05: qty 2

## 2021-10-05 MED ORDER — LIDOCAINE HCL (PF) 1 % IJ SOLN
INTRAMUSCULAR | Status: AC
  Filled 2021-10-05: qty 30

## 2021-10-05 MED ORDER — MIDAZOLAM HCL 2 MG/2ML IJ SOLN
INTRAMUSCULAR | Status: AC | PRN
  Administered 2021-10-05: 1 mg via INTRAVENOUS

## 2021-10-05 MED ORDER — MIDAZOLAM HCL 2 MG/2ML IJ SOLN
INTRAMUSCULAR | Status: AC
  Filled 2021-10-05: qty 2

## 2021-10-05 MED ORDER — LIDOCAINE-EPINEPHRINE 1 %-1:100000 IJ SOLN
INTRAMUSCULAR | Status: AC
  Filled 2021-10-05: qty 1
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
## 2021-10-05 NOTE — Procedures (Signed)
Pre Procedure Dx: Liver lesion Post Procedural Dx: Same  Technically successful US guided biopsy of indeterminate liver lesion  EBL: None  No immediate complications.   Jay Claribel Sachs, MD Pager #: [phone]    

## 2021-10-06 ENCOUNTER — Other Ambulatory Visit (HOSPITAL_COMMUNITY): Payer: Medicaid Other

## 2021-10-12 LAB — SURGICAL PATHOLOGY

## 2021-10-13 ENCOUNTER — Encounter: Payer: Self-pay | Admitting: Adult Health

## 2021-10-13 ENCOUNTER — Inpatient Hospital Stay (HOSPITAL_BASED_OUTPATIENT_CLINIC_OR_DEPARTMENT_OTHER): Payer: Medicare Other | Admitting: Adult Health

## 2021-10-13 ENCOUNTER — Inpatient Hospital Stay: Payer: Medicare Other

## 2021-10-13 ENCOUNTER — Inpatient Hospital Stay: Payer: Medicare Other | Attending: Oncology

## 2021-10-13 ENCOUNTER — Other Ambulatory Visit: Payer: Self-pay

## 2021-10-13 VITALS — BP 128/91 | HR 75 | Temp 97.9°F | Resp 18 | Ht 64.0 in | Wt 214.8 lb

## 2021-10-13 DIAGNOSIS — M810 Age-related osteoporosis without current pathological fracture: Secondary | ICD-10-CM

## 2021-10-13 DIAGNOSIS — C7951 Secondary malignant neoplasm of bone: Secondary | ICD-10-CM | POA: Diagnosis not present

## 2021-10-13 DIAGNOSIS — Z170 Estrogen receptor positive status [ER+]: Secondary | ICD-10-CM

## 2021-10-13 DIAGNOSIS — Z8052 Family history of malignant neoplasm of bladder: Secondary | ICD-10-CM | POA: Diagnosis not present

## 2021-10-13 DIAGNOSIS — C50411 Malignant neoplasm of upper-outer quadrant of right female breast: Secondary | ICD-10-CM | POA: Insufficient documentation

## 2021-10-13 DIAGNOSIS — Z87891 Personal history of nicotine dependence: Secondary | ICD-10-CM | POA: Insufficient documentation

## 2021-10-13 DIAGNOSIS — R197 Diarrhea, unspecified: Secondary | ICD-10-CM | POA: Diagnosis not present

## 2021-10-13 LAB — CBC WITH DIFFERENTIAL (CANCER CENTER ONLY)
Abs Immature Granulocytes: 0.01 10*3/uL (ref 0.00–0.07)
Basophils Absolute: 0 10*3/uL (ref 0.0–0.1)
Basophils Relative: 2 %
Eosinophils Absolute: 0 10*3/uL (ref 0.0–0.5)
Eosinophils Relative: 2 %
HCT: 36.7 % (ref 36.0–46.0)
Hemoglobin: 12.5 g/dL (ref 12.0–15.0)
Immature Granulocytes: 1 %
Lymphocytes Relative: 27 %
Lymphs Abs: 0.6 10*3/uL — ABNORMAL LOW (ref 0.7–4.0)
MCH: 35.1 pg — ABNORMAL HIGH (ref 26.0–34.0)
MCHC: 34.1 g/dL (ref 30.0–36.0)
MCV: 103.1 fL — ABNORMAL HIGH (ref 80.0–100.0)
Monocytes Absolute: 0.2 10*3/uL (ref 0.1–1.0)
Monocytes Relative: 7 %
Neutro Abs: 1.3 10*3/uL — ABNORMAL LOW (ref 1.7–7.7)
Neutrophils Relative %: 61 %
Platelet Count: 296 10*3/uL (ref 150–400)
RBC: 3.56 MIL/uL — ABNORMAL LOW (ref 3.87–5.11)
RDW: 12.9 % (ref 11.5–15.5)
WBC Count: 2.1 10*3/uL — ABNORMAL LOW (ref 4.0–10.5)
nRBC: 0 % (ref 0.0–0.2)

## 2021-10-13 LAB — CMP (CANCER CENTER ONLY)
ALT: 9 U/L (ref 0–44)
AST: 15 U/L (ref 15–41)
Albumin: 3.6 g/dL (ref 3.5–5.0)
Alkaline Phosphatase: 62 U/L (ref 38–126)
Anion gap: 8 (ref 5–15)
BUN: 18 mg/dL (ref 6–20)
CO2: 23 mmol/L (ref 22–32)
Calcium: 8.7 mg/dL — ABNORMAL LOW (ref 8.9–10.3)
Chloride: 109 mmol/L (ref 98–111)
Creatinine: 1.22 mg/dL — ABNORMAL HIGH (ref 0.44–1.00)
GFR, Estimated: 51 mL/min — ABNORMAL LOW (ref >60)
Glucose, Bld: 92 mg/dL (ref 70–99)
Potassium: 3.9 mmol/L (ref 3.5–5.1)
Sodium: 140 mmol/L (ref 135–145)
Total Bilirubin: 0.3 mg/dL (ref 0.3–1.2)
Total Protein: 6.8 g/dL (ref 6.5–8.1)

## 2021-10-13 MED ORDER — EPINEPHRINE 0.3 MG/0.3ML IJ SOAJ: 0.3000 mg | Freq: Once | INTRAMUSCULAR | Status: DC | PRN

## 2021-10-13 MED ORDER — CHOLESTYRAMINE 4 GM/DOSE PO POWD: 2.0000 g | Freq: Two times a day (BID) | ORAL | 12 refills | Status: DC

## 2021-10-13 MED ORDER — ALBUTEROL SULFATE (2.5 MG/3ML) 0.083% IN NEBU: 2.5000 mg | INHALATION_SOLUTION | Freq: Once | RESPIRATORY_TRACT | Status: DC | PRN

## 2021-10-13 MED ORDER — SODIUM CHLORIDE 0.9 % IV SOLN: Freq: Once | INTRAVENOUS | Status: DC | PRN

## 2021-10-13 MED ORDER — DIPHENHYDRAMINE HCL 50 MG/ML IJ SOLN: 50.0000 mg | Freq: Once | INTRAMUSCULAR | Status: DC | PRN

## 2021-10-13 MED ORDER — DENOSUMAB 120 MG/1.7ML ~~LOC~~ SOLN
120.0000 mg | Freq: Once | SUBCUTANEOUS | Status: AC
  Administered 2021-10-13: 120 mg via SUBCUTANEOUS
  Filled 2021-10-13: qty 1.7

## 2021-10-13 MED ORDER — DIPHENHYDRAMINE HCL 50 MG/ML IJ SOLN: 25.0000 mg | Freq: Once | INTRAMUSCULAR | Status: DC | PRN

## 2021-10-13 MED ORDER — FULVESTRANT 250 MG/5ML IM SOSY
500.0000 mg | PREFILLED_SYRINGE | INTRAMUSCULAR | Status: DC
  Administered 2021-10-13: 500 mg via INTRAMUSCULAR
  Filled 2021-10-13 (×2): qty 10

## 2021-10-13 MED ORDER — METHYLPREDNISOLONE SODIUM SUCC 125 MG IJ SOLR: 125.0000 mg | Freq: Once | INTRAMUSCULAR | Status: DC | PRN

## 2021-10-13 NOTE — Progress Notes (Addendum)
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 mammography and breast tomosynthesis was performed. The images were evaluated with computer-aided detection.; Targeted  ultrasound examination of the right breast was performed COMPARISON:  Previous exam(s). ACR Breast Density Category b: There are scattered areas of fibroglandular density. FINDINGS: The known right breast cancer in the upper outer quadrant, containing a biopsy clip, is less masslike in the interval with a smaller soft tissue component. However, there is increased spiculation in this region. No other suspicious changes are seen in either breast. On physical exam, no suspicious lumps are identified. Targeted ultrasound is performed, showing the known right breast cancer at 10 o'clock, 8 cm from the nipple measuring 11 x 14 x 15 mm today versus 15 x 15 x 15 mm previously. No axillary adenopathy identified on the right. IMPRESSION: The known right breast cancer is less masslike  mammographically in the interval but there is increased spiculation. Sonographically, the mass is a little smaller in the interval. No other abnormalities in either breast. No adenopathy in the right axilla. RECOMMENDATION: Recommend continued oncologic follow up. Recommend annual mammography. I have discussed the findings and recommendations with the patient. If applicable, a reminder letter will be sent to the patient regarding the next appointment. BI-RADS CATEGORY  6: Known biopsy-proven malignancy. Electronically Signed   By: Dorise Bullion III M.D.   On: 09/27/2021 09:35     ASSESSMENT: 60 y.o. [place] woman status post right breast upper outer quadrant and right axillary lymph node biopsy 06/21/2016, both positive for a clinical T2 N1,stage IIIA  invasive ductal carcinoma, grade 3, estrogen and progesterone receptor positive, HER-2 nonamplified, with an MIB-1 between 20 and 25%   (1) neoadjuvant chemotherapy consisting of doxorubicin and cyclophosphamide in dose dense fashion 4, starting 07/17/2016, followed by weekly paclitaxel 12  (a) cyclophosphamide/doxorubicin interrupted after 2 cycles because of repeated febrile neutropenia episodes  (b) started weekly paclitaxel 08/23/2016  (c) paclitaxel discontinued after 7 cycles because of neuropathy: last dose 10/04/2016  (c) she received her final 2 cycles of cyclophosphamide and doxorubicin 10/23/2016 and 11/06/2016  (2) status post right lumpectomy and sentinel lymph node sampling 12/19/2016 for a residual mpT1c pN2 invasive ductal carcinoma grade 2, with negative margins  (a) completion axillary dissection 12/26/2016 found one additional of 20 removed lymph nodes to be involved by tumor (total 3/22 lymph nodes positive)  (3) adjuvant radiation 02/07/17 - 03/21/17 : Right Breast and Nodes treated to 50 Gy in 25 fractions. Right Breast boosted an additional 10 Gy in 5 fractions.  (4) started anastrozole early part of May 2018  (a) bone  density 03/23/2017 finds a T score of -2.6, osteoporosis.  (b) to start denosumab/Prolia after dental clearance (scheduled for extraction)  (c) anastrozole held 01/15/2018 for possible side effects, changed to exemestane  (5) on [name] trial, signed consent 05/30/2017, randomized to hormone therapy alone  (6) exemestane started 02/11/2018  (a) bone density on 03/23/2017 shows osteoporosis, T score of -2.6 in the AP spine  (b) started Prolia/denosumab 12/17/2018  (c) exemestane discontinued July 2021 with evidence of progression  (7) CT of the abdomen and pelvis obtained 11/28/2018 to evaluate for appendicitis showed no evidence of metastatic disease  METASTATIC DISEASE June 2021 (8) chest CT scan 05/27/2020 shows bulky mediastinal and right hilar lymphadenopathy with right pleural nodules and a small right pleural effusion, no evidence of liver or bone involvement  (a) biopsy of right breast mass 06/02/2020 shows invasive ductal carcinoma, estrogen and progesterone receptor positive, HER-2 not amplified, with an MIB-1 of 40%  (9) fulvestrant to start 06/17/2020  (a) palbociclib to start 06/17/2020 at 125 mg  daily 21 days on 7 days off  (b) palbociclib decreased to 154m every other day x 11 doses on 07/23/2020  (c) palbociclib dose decreased to100 mg daily, 21/7, starting with September cycle  (D) Palbociclib decreased to 767mdaily beginning with March cycle due to oral ulcers  (10) restaging studies:  (a) chest CT scan 09/03/2020 shows evidence of response  (b) right breast ultrasonography shows no change in the 1.5 cm measurable disease  (c) chest CT of 01/22/2021 is stable  (d) CT chest on 05/12/2021 shows possible enlarged hilar node  (e) CT chest on 08/15/2021 shows possible progression in pretracheal node  (f) PET scan 09/14/2021 shows progression in liver and bone  (g) liver biopsy 10/05/2021 confirms metastatic carcinoma, estrogen receptor strongly positive, progesterone receptor and  HER2 negative, with an MIB-1 of 30%.  (11) fulvestrant continued, palbociclib changed to abemaciclib November 2022  (12) denosumab/Xgeva started November 2022   PLAN: DeMadolyns here today to discuss her recently progressed metastatic breast cancer.  She met with myself and Dr. MaJana [name] At 1 point cameo the research nurse joined our visit.  I reviewed with April [name] scans along with her pathology results.  We reviewed her previous imaging results and also her previous treatments and how they worked.  She then met with Dr. MaJana Hakimho discussed moving forward.  He noted that in her previous imaging she had 1 lesion in the liver and now she has 3.  So he would like to get an MRI of the liver to serve as a baseline.  He would like to start Verzenio.  We will also change Ivanna to XgWanshiprom Prolia to help with her new bone mets.  She will continue fulvestrant.  She will also continue her Ibrance until she runs out which she has 1 week left.  Dr. MaJana Hakimounseled April Gaussn diarrhea [facility] I sent in Qu[place]or her to take to prevent diarrhea.  Right now she is having normal bowel movements.  At the completion of the visit April [name] very tearful and anxious about what this means moving forward.  I gave April [name] and let her know that there are many treatments options for estrogen positive metastatic breast cancer.  She was appreciative to hear this.  April [name] return in 4 weeks for labs, follow-up with Dr. MaJana Hakiminjections with fulvestrant and XgDelton Seehe knows to call for any questions that may arise between now and her next appointment.  We are happy to see her sooner if needed.  Total encounter time: 45 minutes*   LiWilber BihariNP 10/13/21 3:48 PM Medical Oncology and Hematology [facility] [id number]. [phone]  Fax. [phone] ADDENDUM: April [name]'s scans show evidence of progression.  The liver  biopsy is very helpful in guiding further treatment.  It also does give usKoreaathologic confirmation of her stage IV disease, which we did not have previously  We are going to be following her liver lesions as the site of measurable disease and we are setting her up for a liver MRI to serve as baseline.  We are continuing the fulvestrant but switching from palbociclib to abemaciclib.  We are also changing the Prolia to [facility] her first dose will be 11/09/2021.  The abemaciclib may be started as soon as she receives it.  We did discuss possible side effects toxicities and complications of the various ages today and she is aware  of the concern regarding osteonecrosis of the jaw with denosumab and of course multiple issues with the abemaciclib but particularly concerns regarding diarrhea.  She will see me again 11/09/2021 with her first Xgeva dose.  We will troubleshoot problems at that time but of course she knows to call us for any other issue that may develop before then.     I personally saw this patient and performed a substantive portion of this encounter with the listed APP documented above.   Chauncey Cruel, MD Medical Oncology and Hematology [facility] [address] [place], [place] [address] Tel. [phone]    Fax. [phone]    *Total Encounter Time as defined by the Centers for Medicare and Medicaid Services includes, in addition to the face-to-face time of a patient visit (documented in the note above) non-face-to-face time: obtaining and reviewing outside history, ordering and reviewing medications, tests or procedures, care coordination (communications with other health care professionals or caregivers) and documentation in the medical record.

## 2021-10-14 ENCOUNTER — Telehealth: Payer: Self-pay | Admitting: Pharmacy Technician

## 2021-10-14 ENCOUNTER — Other Ambulatory Visit (HOSPITAL_COMMUNITY): Payer: Self-pay

## 2021-10-14 ENCOUNTER — Telehealth: Payer: Self-pay

## 2021-10-14 ENCOUNTER — Encounter: Payer: Self-pay | Admitting: Adult Health

## 2021-10-14 MED ORDER — ABEMACICLIB 150 MG PO TABS
150.0000 mg | ORAL_TABLET | Freq: Two times a day (BID) | ORAL | 6 refills | Status: DC
Start: 2021-10-14 — End: 2021-11-09
  Filled 2021-10-14: qty 70, 35d supply, fill #0
  Filled 2021-10-18: qty 56, 28d supply, fill #0

## 2021-10-14 NOTE — Telephone Encounter (Signed)
Oral Oncology Pharmacist Encounter  Received new prescription for abemaciclib (Verzenio) for the treatment of metasatic, hormone receptor positive, HER2 negative breast cancer in conjunction with fulvestrant, planned duration until disease progression or unacceptable toxicity.  Labs from 10/13/2021 assessed, no interventions needed. Prescription dose and frequency assessed, no interventions needed.  Current medication list in Epic reviewed, DDIs with verzenio identified: none  Evaluated chart and no patient barriers to medication adherence noted.   Patient agreement for treatment documented in MD note on 10/13/2021.  Prescription has been e-scribed to the [facility] for benefits analysis and approval.  Oral Oncology Clinic will continue to follow for insurance authorization, copayment issues, initial counseling and start date.  Drema Halon, PharmD Hematology/Oncology Clinical Pharmacist [facility] [phone] 10/14/2021 1:35 PM

## 2021-10-14 NOTE — Telephone Encounter (Signed)
Oral Oncology Patient Advocate Encounter  After completing a benefits investigation, prior authorization for Verzenio is not required at this time through [facility].  Patient's copay is $0.00  [place] Patient Eva Phone [phone] Fax [phone] 10/14/2021 1:10 PM

## 2021-10-18 ENCOUNTER — Other Ambulatory Visit (HOSPITAL_COMMUNITY): Payer: Self-pay

## 2021-10-18 NOTE — Telephone Encounter (Signed)
Oral Chemotherapy Pharmacist Encounter  Verzenio will be delivered on 11/17 to start on 11/25.  I spoke with patient for overview of: Verzenio for the treatment of metastatic, hormone-receptor positive breast cancer, in combination with fulvestrant, planned duration until disease progression or unacceptable toxicity.   Counseled patient on administration, dosing, side effects, monitoring, drug-food interactions, safe handling, storage, and disposal.  Patient will take Verzenio 150 mg tablets, 1 tablet by mouth twice daily without regard to food.  Patient knows to avoid grapefruit and grapefruit juice.  Verzenio start date: 10/28/21  Adverse effects include but are not limited to: diarrhea, fatigue, nausea, abdominal pain, decreased blood counts, and increased liver function tests, and joint pains. Severe, life-threatening, and/or fatal interstitial lung disease (ILD) and/or pneumonitis may occur with CDK 4/6 inhibitors.  Patient will call if an anti-emetic is needed for nausea or vomiting.   Patient will obtain anti diarrheal and alert the office of 4 or more loose stools above baseline. Additionally, pt received cholestyramine prescription at last visit on 10/13/21 for prevention of diarrhea.   Reviewed with patient importance of keeping a medication schedule and plan for any missed doses. No barriers to medication adherence identified.  Medication reconciliation performed and medication/allergy list updated.  Insurance authorization for [employer] has been obtained. Test claim at the pharmacy revealed copayment $0 for 1st fill of Verzenio. This will ship from the [place] on 11/16 to deliver to patient's home on 11/17.  Patient informed the pharmacy will reach out 5-7 days prior to needing next fill of Verzenio to coordinate continued medication acquisition to prevent break in therapy.  All questions answered.  [name] voiced understanding and appreciation.    Medication education handout placed in mail for patient. Patient knows to call the office with questions or concerns. Oral Chemotherapy Clinic phone number provided to patient.   Benn Moulder, PharmD Pharmacy Resident  10/18/2021 11:27 AM

## 2021-10-24 ENCOUNTER — Other Ambulatory Visit (HOSPITAL_COMMUNITY): Payer: Self-pay

## 2021-10-31 ENCOUNTER — Other Ambulatory Visit: Payer: Self-pay | Admitting: *Deleted

## 2021-10-31 ENCOUNTER — Ambulatory Visit (HOSPITAL_COMMUNITY)
Admission: RE | Admit: 2021-10-31 | Discharge: 2021-10-31 | Disposition: A | Discharge status: Home / Self Care | Payer: Medicare Other | Source: Ambulatory Visit | Attending: Adult Health | Admitting: Adult Health

## 2021-10-31 ENCOUNTER — Other Ambulatory Visit: Payer: Self-pay

## 2021-10-31 ENCOUNTER — Other Ambulatory Visit (HOSPITAL_COMMUNITY): Payer: Self-pay

## 2021-10-31 DIAGNOSIS — C50411 Malignant neoplasm of upper-outer quadrant of right female breast: Secondary | ICD-10-CM | POA: Insufficient documentation

## 2021-10-31 DIAGNOSIS — Z170 Estrogen receptor positive status [ER+]: Secondary | ICD-10-CM | POA: Diagnosis present

## 2021-10-31 MED ORDER — ONDANSETRON HCL 8 MG PO TABS
8.0000 mg | ORAL_TABLET | Freq: Three times a day (TID) | ORAL | 1 refills | Status: DC | PRN
  Filled 2021-10-31: qty 30, 10d supply, fill #0

## 2021-10-31 MED ORDER — GADOBUTROL 1 MMOL/ML IV SOLN
10.0000 mL | Freq: Once | INTRAVENOUS | Status: AC | PRN
  Administered 2021-10-31: 13:00:00 10 mL via INTRAVENOUS

## 2021-11-01 ENCOUNTER — Other Ambulatory Visit (HOSPITAL_COMMUNITY): Payer: Self-pay

## 2021-11-01 ENCOUNTER — Encounter: Payer: Self-pay | Admitting: Adult Health

## 2021-11-02 ENCOUNTER — Other Ambulatory Visit (HOSPITAL_COMMUNITY): Payer: Self-pay

## 2021-11-03 ENCOUNTER — Other Ambulatory Visit (HOSPITAL_COMMUNITY): Payer: Self-pay

## 2021-11-04 ENCOUNTER — Encounter: Payer: Self-pay | Admitting: Adult Health

## 2021-11-07 ENCOUNTER — Other Ambulatory Visit (HOSPITAL_COMMUNITY): Payer: Self-pay
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
## 2021-11-09 ENCOUNTER — Inpatient Hospital Stay: Payer: Medicaid Other | Attending: Internal Medicine

## 2021-11-09 ENCOUNTER — Inpatient Hospital Stay: Payer: Medicaid Other

## 2021-11-09 ENCOUNTER — Inpatient Hospital Stay (HOSPITAL_BASED_OUTPATIENT_CLINIC_OR_DEPARTMENT_OTHER): Payer: Medicaid Other | Admitting: Oncology

## 2021-11-09 ENCOUNTER — Other Ambulatory Visit (HOSPITAL_COMMUNITY): Payer: Self-pay

## 2021-11-09 ENCOUNTER — Other Ambulatory Visit: Payer: Self-pay

## 2021-11-09 VITALS — BP 114/64 | HR 84 | Temp 98.1°F | Resp 18 | Ht 64.0 in | Wt 214.0 lb

## 2021-11-09 VITALS — BP 122/80 | HR 82 | Temp 98.1°F | Resp 16

## 2021-11-09 DIAGNOSIS — Z170 Estrogen receptor positive status [ER+]: Secondary | ICD-10-CM | POA: Diagnosis not present

## 2021-11-09 DIAGNOSIS — C787 Secondary malignant neoplasm of liver and intrahepatic bile duct: Secondary | ICD-10-CM

## 2021-11-09 DIAGNOSIS — M810 Age-related osteoporosis without current pathological fracture: Secondary | ICD-10-CM

## 2021-11-09 DIAGNOSIS — Z8052 Family history of malignant neoplasm of bladder: Secondary | ICD-10-CM | POA: Insufficient documentation

## 2021-11-09 DIAGNOSIS — C50911 Malignant neoplasm of unspecified site of right female breast: Secondary | ICD-10-CM | POA: Diagnosis not present

## 2021-11-09 DIAGNOSIS — C50411 Malignant neoplasm of upper-outer quadrant of right female breast: Secondary | ICD-10-CM

## 2021-11-09 DIAGNOSIS — M818 Other osteoporosis without current pathological fracture: Secondary | ICD-10-CM | POA: Diagnosis not present

## 2021-11-09 DIAGNOSIS — C773 Secondary and unspecified malignant neoplasm of axilla and upper limb lymph nodes: Secondary | ICD-10-CM

## 2021-11-09 DIAGNOSIS — Z87891 Personal history of nicotine dependence: Secondary | ICD-10-CM | POA: Diagnosis not present

## 2021-11-09 LAB — CMP (CANCER CENTER ONLY)
ALT: 12 U/L (ref 0–44)
AST: 14 U/L — ABNORMAL LOW (ref 15–41)
Albumin: 3.4 g/dL — ABNORMAL LOW (ref 3.5–5.0)
Alkaline Phosphatase: 56 U/L (ref 38–126)
Anion gap: 9 (ref 5–15)
BUN: 13 mg/dL (ref 6–20)
CO2: 21 mmol/L — ABNORMAL LOW (ref 22–32)
Calcium: 8.7 mg/dL — ABNORMAL LOW (ref 8.9–10.3)
Chloride: 111 mmol/L (ref 98–111)
Creatinine: 0.99 mg/dL (ref 0.44–1.00)
GFR, Estimated: >60 mL/min (ref >60)
Glucose, Bld: 100 mg/dL — ABNORMAL HIGH (ref 70–99)
Potassium: 3.6 mmol/L (ref 3.5–5.1)
Sodium: 141 mmol/L (ref 135–145)
Total Bilirubin: 0.3 mg/dL (ref 0.3–1.2)
Total Protein: 6.7 g/dL (ref 6.5–8.1)

## 2021-11-09 LAB — CBC WITH DIFFERENTIAL (CANCER CENTER ONLY)
Abs Immature Granulocytes: 0.01 10*3/uL (ref 0.00–0.07)
Basophils Absolute: 0 10*3/uL (ref 0.0–0.1)
Basophils Relative: 1 %
Eosinophils Absolute: 0.1 10*3/uL (ref 0.0–0.5)
Eosinophils Relative: 4 %
HCT: 35 % — ABNORMAL LOW (ref 36.0–46.0)
Hemoglobin: 12.2 g/dL (ref 12.0–15.0)
Immature Granulocytes: 0 %
Lymphocytes Relative: 14 %
Lymphs Abs: 0.4 10*3/uL — ABNORMAL LOW (ref 0.7–4.0)
MCH: 35.9 pg — ABNORMAL HIGH (ref 26.0–34.0)
MCHC: 34.9 g/dL (ref 30.0–36.0)
MCV: 102.9 fL — ABNORMAL HIGH (ref 80.0–100.0)
Monocytes Absolute: 0.2 10*3/uL (ref 0.1–1.0)
Monocytes Relative: 6 %
Neutro Abs: 2.2 10*3/uL (ref 1.7–7.7)
Neutrophils Relative %: 75 %
Platelet Count: 275 10*3/uL (ref 150–400)
RBC: 3.4 MIL/uL — ABNORMAL LOW (ref 3.87–5.11)
RDW: 13.9 % (ref 11.5–15.5)
WBC Count: 3 10*3/uL — ABNORMAL LOW (ref 4.0–10.5)
nRBC: 0 % (ref 0.0–0.2)

## 2021-11-09 MED ORDER — ABEMACICLIB 50 MG PO TABS
50.0000 mg | ORAL_TABLET | Freq: Two times a day (BID) | ORAL | 6 refills | Status: DC
  Filled 2021-11-09: qty 70, 35d supply, fill #0
  Filled 2021-11-09: qty 56, 28d supply, fill #0

## 2021-11-09 MED ORDER — FULVESTRANT 250 MG/5ML IM SOSY
500.0000 mg | PREFILLED_SYRINGE | INTRAMUSCULAR | Status: DC
  Administered 2021-11-09: 500 mg via INTRAMUSCULAR
  Filled 2021-11-09: qty 10

## 2021-11-09 MED ORDER — DENOSUMAB 120 MG/1.7ML ~~LOC~~ SOLN
120.0000 mg | Freq: Once | SUBCUTANEOUS | Status: AC
  Administered 2021-11-09: 120 mg via SUBCUTANEOUS
  Filled 2021-11-09: qty 1.7

## 2021-11-10 ENCOUNTER — Encounter: Payer: Self-pay | Admitting: Adult Health

## 2021-11-10 LAB — CANCER ANTIGEN 27.29: CA 27.29: 191.8 U/mL — ABNORMAL HIGH (ref 0.0–38.6)

## 2021-11-17 ENCOUNTER — Other Ambulatory Visit (HOSPITAL_COMMUNITY): Payer: Self-pay

## 2021-11-30 ENCOUNTER — Encounter: Payer: Self-pay | Admitting: Adult Health

## 2021-12-07 ENCOUNTER — Other Ambulatory Visit: Payer: Self-pay

## 2021-12-07 ENCOUNTER — Inpatient Hospital Stay: Payer: Medicaid Other | Attending: Oncology

## 2021-12-07 ENCOUNTER — Encounter: Payer: Self-pay | Admitting: Adult Health

## 2021-12-07 ENCOUNTER — Inpatient Hospital Stay: Payer: Medicaid Other

## 2021-12-07 ENCOUNTER — Inpatient Hospital Stay (HOSPITAL_BASED_OUTPATIENT_CLINIC_OR_DEPARTMENT_OTHER): Payer: Medicaid Other | Admitting: Adult Health

## 2021-12-07 VITALS — BP 121/65 | HR 79 | Temp 98.0°F | Resp 18 | Ht 64.0 in | Wt 215.4 lb

## 2021-12-07 DIAGNOSIS — Z170 Estrogen receptor positive status [ER+]: Secondary | ICD-10-CM | POA: Diagnosis not present

## 2021-12-07 DIAGNOSIS — C50411 Malignant neoplasm of upper-outer quadrant of right female breast: Secondary | ICD-10-CM | POA: Diagnosis present

## 2021-12-07 DIAGNOSIS — C50919 Malignant neoplasm of unspecified site of unspecified female breast: Secondary | ICD-10-CM

## 2021-12-07 DIAGNOSIS — C773 Secondary and unspecified malignant neoplasm of axilla and upper limb lymph nodes: Secondary | ICD-10-CM

## 2021-12-07 DIAGNOSIS — T451X5A Adverse effect of antineoplastic and immunosuppressive drugs, initial encounter: Secondary | ICD-10-CM

## 2021-12-07 DIAGNOSIS — C787 Secondary malignant neoplasm of liver and intrahepatic bile duct: Secondary | ICD-10-CM | POA: Insufficient documentation

## 2021-12-07 DIAGNOSIS — G620 Drug-induced polyneuropathy: Secondary | ICD-10-CM | POA: Diagnosis not present

## 2021-12-07 DIAGNOSIS — C7951 Secondary malignant neoplasm of bone: Secondary | ICD-10-CM | POA: Diagnosis not present

## 2021-12-07 DIAGNOSIS — L98491 Non-pressure chronic ulcer of skin of other sites limited to breakdown of skin: Secondary | ICD-10-CM | POA: Diagnosis not present

## 2021-12-07 DIAGNOSIS — M810 Age-related osteoporosis without current pathological fracture: Secondary | ICD-10-CM

## 2021-12-07 LAB — CMP (CANCER CENTER ONLY)
ALT: 9 U/L (ref 0–44)
AST: 13 U/L — ABNORMAL LOW (ref 15–41)
Albumin: 3.8 g/dL (ref 3.5–5.0)
Alkaline Phosphatase: 65 U/L (ref 38–126)
Anion gap: 6 (ref 5–15)
BUN: 18 mg/dL (ref 6–20)
CO2: 27 mmol/L (ref 22–32)
Calcium: 9.5 mg/dL (ref 8.9–10.3)
Chloride: 105 mmol/L (ref 98–111)
Creatinine: 1.01 mg/dL — ABNORMAL HIGH (ref 0.44–1.00)
GFR, Estimated: >60 mL/min (ref >60)
Glucose, Bld: 81 mg/dL (ref 70–99)
Potassium: 4 mmol/L (ref 3.5–5.1)
Sodium: 138 mmol/L (ref 135–145)
Total Bilirubin: 0.3 mg/dL (ref 0.3–1.2)
Total Protein: 6.9 g/dL (ref 6.5–8.1)

## 2021-12-07 LAB — CBC WITH DIFFERENTIAL (CANCER CENTER ONLY)
Abs Immature Granulocytes: 0.01 10*3/uL (ref 0.00–0.07)
Basophils Absolute: 0 10*3/uL (ref 0.0–0.1)
Basophils Relative: 1 %
Eosinophils Absolute: 0.1 10*3/uL (ref 0.0–0.5)
Eosinophils Relative: 4 %
HCT: 34.9 % — ABNORMAL LOW (ref 36.0–46.0)
Hemoglobin: 11.8 g/dL — ABNORMAL LOW (ref 12.0–15.0)
Immature Granulocytes: 0 %
Lymphocytes Relative: 16 %
Lymphs Abs: 0.5 10*3/uL — ABNORMAL LOW (ref 0.7–4.0)
MCH: 35.8 pg — ABNORMAL HIGH (ref 26.0–34.0)
MCHC: 33.8 g/dL (ref 30.0–36.0)
MCV: 105.8 fL — ABNORMAL HIGH (ref 80.0–100.0)
Monocytes Absolute: 0.2 10*3/uL (ref 0.1–1.0)
Monocytes Relative: 7 %
Neutro Abs: 2.3 10*3/uL (ref 1.7–7.7)
Neutrophils Relative %: 72 %
Platelet Count: 254 10*3/uL (ref 150–400)
RBC: 3.3 MIL/uL — ABNORMAL LOW (ref 3.87–5.11)
RDW: 13.4 % (ref 11.5–15.5)
WBC Count: 3.2 10*3/uL — ABNORMAL LOW (ref 4.0–10.5)
nRBC: 0 % (ref 0.0–0.2)

## 2021-12-07 MED ORDER — VALACYCLOVIR HCL 1 G PO TABS: 1000.0000 mg | ORAL_TABLET | Freq: Two times a day (BID) | ORAL | 4 refills | Status: DC

## 2021-12-07 MED ORDER — ONDANSETRON HCL 8 MG PO TABS: 8.0000 mg | ORAL_TABLET | Freq: Three times a day (TID) | ORAL | 1 refills | Status: DC | PRN

## 2021-12-07 MED ORDER — FULVESTRANT 250 MG/5ML IM SOSY
500.0000 mg | PREFILLED_SYRINGE | INTRAMUSCULAR | Status: DC
  Administered 2021-12-07: 500 mg via INTRAMUSCULAR
  Filled 2021-12-07 (×2): qty 10

## 2021-12-07 MED ORDER — DENOSUMAB 120 MG/1.7ML ~~LOC~~ SOLN
120.0000 mg | Freq: Once | SUBCUTANEOUS | Status: AC
  Administered 2021-12-07: 120 mg via SUBCUTANEOUS
  Filled 2021-12-07: qty 1.7

## 2021-12-07 NOTE — Assessment & Plan Note (Signed)
Metastatic breast cancer Current treatment: Abemaciclib, fulvestrant, Xgeva  1.  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  She is tolerating this well and will continue this.  She is continuing to take the abemaciclib at 150 mg p.o. daily without any further diarrhea.  Her labs are stable she will continue this treatment.  When she runs out of the 150 mg in 8 days she will then go to the 50 mg twice daily as instructed by Dr. Jana Hakim at her last appointment with him.  We discussed that restaging would likely be after 3-4 full months of treatment.  Her next visit is with Dr. Lindi Adie and I let her know that he will likely have a firm opinion about her next scan dates.  #2.  Skin breakdown and ulceration: I refilled her Valtrex for this.  She takes 500 mg twice daily.  3.  Chemotherapy-induced peripheral neuropathy.  This is improving and now intermittent only noticeable in her toes.  She is not having any pain or balance issues related to this.  4.  Bowel changes.  These are related to the abemaciclib.  She is taking MiraLAX daily now and this is managed well.  Roshell and I reviewed healthy diet and exercise.  She is working on both of these things.  She will return in 4 weeks for labs, follow-up with Dr. Lindi Adie, and her next injection.
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
## 2021-12-08 LAB — CANCER ANTIGEN 27.29: CA 27.29: 167.6 U/mL — ABNORMAL HIGH (ref 0.0–38.6)

## 2021-12-14 ENCOUNTER — Other Ambulatory Visit (HOSPITAL_COMMUNITY): Payer: Self-pay

## 2021-12-14 NOTE — Telephone Encounter (Signed)
Patient's therpy changed, no longer on Ibrance.

## 2021-12-28 ENCOUNTER — Encounter: Payer: Self-pay | Admitting: Adult Health

## 2022-01-03 NOTE — Assessment & Plan Note (Signed)
Metastatic breast cancer Current treatment: Abemaciclib, fulvestrant, Xgeva  1.  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 On abemaciclib at 150 mg p.o. daily without any further diarrhea.  Now on 50 mg bid  #2.  Skin breakdown and ulceration: I refilled her Valtrex for this.  She takes 500 mg twice daily.  3.  Chemotherapy-induced peripheral neuropathy.  This is improving and now intermittent only noticeable in her toes.  She is not having any pain or balance issues related to this.  4.  Bowel changes.  These are related to the abemaciclib.  She is taking MiraLAX daily now and this is managed well.

## 2022-01-03 NOTE — Progress Notes (Signed)
Patient Care Team: [name], MD as PCP - General (Internal Medicine) [name], MD as Attending Physician (Radiation Oncology) [name] [name], NP as Nurse Practitioner (Hematology and Oncology) [name], MD as Consulting Physician (General Surgery) [name], DPM as Consulting Physician (Podiatry) [name], MD as Consulting Physician (Gastroenterology) [name], MD as Consulting Physician (Hematology and Oncology)  DIAGNOSIS:    ICD-10-CM   1. Liver metastases [facility])  C78.7 CT CHEST ABDOMEN PELVIS W CONTRAST    2. Malignant neoplasm of upper-outer quadrant of right breast in female, estrogen receptor positive ([place])  C50.411 CT CHEST ABDOMEN PELVIS W CONTRAST   Z17.0       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[place])  06/21/2016 Initial Diagnosis   Breast cancer metastasized to axillary lymph node ([place])   Malignant neoplasm of upper-outer quadrant of right breast in female, estrogen receptor positive ([place])  06/21/2016 Initial Diagnosis   Malignant neoplasm of upper-outer quadrant of right breast in female, estrogen receptor positive ([place])   06/21/2016 Initial Biopsy   Right breast biopsy, 10 oclock: IDC, grade 3, ER+(95%), PR+(80%),Ki67 20%, HER-2 negative (ratio 0.67). Right axilla core biopsy: carcinoma, grade 3, ER+(100%), PR+(90%), Ki67 25%, HER-2 negative (ratio 1.13).    07/17/2016 - 11/06/2016 Neo-Adjuvant Chemotherapy   Received 2 cycles of Doxorubicin and Cyclophosphamide, then transitioned to weekly Paclitaxel (due to repeated febrile neutropenia) x 7 cycles, stopped early due to peripheral neuropathy, then completed her final 2 cycles of Doxorubicin and Cyclophosphamide.    12/19/2016 Surgery   Right breast lumpectomy ([facility]): IDC, grade 2, 1.6cm and 0.3cm, margins negative, 3 SLN positive for metastatic carcinoma.     12/26/2016 Surgery   ALND: metastatic  carcinoma in one of 20 lymph nodes, and three nodes from previous lumpectomy.  Four positive nodes, consistent with pN2a.   02/07/2017 - 03/21/2017 Radiation Therapy   Adjuvant radiation Isidore Moos): 1) Right breast and nodes - 4 field: 50 Gy in 25 fractions. IM NODES: >95% receive at least 45Gy/49f. 50Gy to SCLV/PAB @ 2Gy /fraction x 25 fractions. 2) Right breast boost: 10 Gy in 5 fractions   04/2017 -  Anti-estrogen oral therapy   Anastrozole 178mdaily.  Bone density 03/23/2017 finds T score of -2.6, osteoporosis, plan to start Prolia following dental clearance Anastrozole stopped 01/16/18 Exemestane 25 mg daily 02/12/18  On [name], trial randomized to endocrine therapy alone   05/27/2020 Progression   chest CT scan 05/27/2020 shows bulky mediastinal and right hilar lymphadenopathy with right pleural nodules and a small right pleural effusion, no evidence of liver or bone involvement             (a) biopsy of right breast mass 06/02/2020 shows invasive ductal carcinoma, estrogen and progesterone receptor positive, HER-2 not amplified, with an MIB-1 of 40%   06/17/2020 Treatment Plan Change    fulvestrant to start 06/17/2020             (a) palbociclib to start 06/17/2020 at 125 mg daily 21 days on 7 days off             (b) palbociclib decreased to 12573mvery other day x 11 doses on 07/23/2020             (c) palbociclib dose decreased to100 mg daily, 21/7, starting with September cycle             (d) Palbociclib decreased to 94m94mily beginning  with March cycle due to oral ulcers             (e) palbociclib discontinued November 2022 with progression   09/14/2021 PET scan   PET scan 09/14/2021 shows progression in liver and bone   10/05/2021 Pathology Results   liver biopsy 10/05/2021 confirms metastatic carcinoma, estrogen receptor strongly positive, progesterone receptor and HER2 negative, with an MIB-1 of 30%.   10/2021 Treatment Plan Change   fulvestrant continued, Xgeva added,  palbociclib changed to abemaciclib November 2022 -Dose Decreased to 68m PO BID Daily     CHIEF COMPLIANT: Follow-up of metastatic breast cancer, setting up with me for oncology care  INTERVAL HISTORY: April KEDZIERSKIis a 61y.o. with above-mentioned history of ER+ breast cancer. She presents to the clinic today for follow-up.  Prior diagnosis in 2017 breast cancer was ER/PR positive that required adjuvant chemotherapy lumpectomy and radiation and antiestrogen therapy.  In July 2021 she was diagnosed with metastatic disease and was started on Ibrance and fulvestrant.  [facility]  She could not tolerate 150 mg of Verzenio twice a day and therefore they had to reduce the dosage to 50 twice a day.  She is tolerating this 50 twice a day dose extremely well without any problems.  No further issues with diarrhea.  ALLERGIES:  is allergic to cymbalta [duloxetine hcl], hydrocodone, ultram [tramadol hcl], venlafaxine, and gabapentin.  MEDICATIONS:  Current Outpatient Medications  Medication Sig Dispense Refill   abemaciclib (VERZENIO) 100 MG tablet Take 1 tablet (100 mg total) by mouth 2 (two) times daily. Swallow tablets whole. Do not chew, crush, or split tablets before swallowing. 60 tablet 6   cetirizine (ZYRTEC) 10 MG tablet Take 10 mg by mouth daily.      Denosumab (XGEVA [place]) Inject 120 mg into the skin every 30 (thirty) days.     famotidine (PEPCID) 20 MG tablet Take 20 mg by mouth daily.     Fulvestrant (FASLODEX IM) Inject 500 mg into the muscle every 30 (thirty) days. Starting Monthly with next dose. [facility]     naproxen sodium (ALEVE) 220 MG tablet Take 660 mg by mouth 3 (three) times daily as needed (pain).     ondansetron (ZOFRAN) 8 MG tablet Take 1 tablet (8 mg total) by mouth every 8 (eight) hours as needed for nausea or vomiting. 30 tablet 1   valACYclovir (VALTREX) 1000 MG tablet Take 1 tablet (1,000  mg total) by mouth 2 (two) times daily. 1/2 tab BID 180 tablet 4   No current facility-administered medications for this visit.    PHYSICAL EXAMINATION: ECOG PERFORMANCE STATUS: 1 - Symptomatic but completely ambulatory  Vitals:   01/04/22 1001  BP: 117/84  Pulse: 76  Resp: 17  Temp: 97.7 F (36.5 C)  SpO2: 98%   Filed Weights   01/04/22 1001  Weight: 211 lb 12.8 oz (96.1 kg)       LABORATORY DATA:  I have reviewed the data as listed CMP Latest Ref Rng & Units 01/04/2022 12/07/2021 11/09/2021  Glucose 70 - 99 mg/dL 91 81 100(H)  BUN 6 - 20 mg/dL _0 Creatinine 0.44 - 1.00 mg/dL 1.14(H) 1.01(H) 0.99  Sodium 135 - 145 mmol/L 139 138 141  Potassium 3.5 - 5.1 mmol/L 4.0 4.0 3.6  Chloride 98 - 111 mmol/L 108 105 111  CO2 22 - 32 mmol/L 25 27 21(L)  Calcium  8.9 - 10.3 mg/dL 9.5 9.5 8.7(L)  Total Protein 6.5 - 8.1 g/dL 7.1 6.9 6.7  Total Bilirubin 0.3 - 1.2 mg/dL 0.4 0.3 0.3  Alkaline Phos 38 - 126 U/L 65 65 56  AST 15 - 41 U/L 15 13(L) 14(L)  ALT 0 - 44 U/L _0 Lab Results  Component Value Date   WBC 3.0 (L) 01/04/2022   HGB 12.6 01/04/2022   HCT 36.9 01/04/2022   MCV 103.4 (H) 01/04/2022   PLT 238 01/04/2022   NEUTROABS 1.9 01/04/2022    ASSESSMENT & PLAN:  Malignant neoplasm of upper-outer quadrant of right breast in female, estrogen receptor positive ([place]) Metastatic breast cancer Current treatment: Abemaciclib, fulvestrant, Xgeva  1.  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 On abemaciclib at 50 mg p.o. daily without any further diarrhea. We will increase the dosage of Verzinio to 100 mg bid  2. bone metastasis: I recommend moving Xgeva to every 3 months.  3.  Chemotherapy-induced peripheral neuropathy.  This is improving and now intermittent only noticeable in her toes.  She is not having any pain or balance issues related to this.  We discussed about switching her from Faslodex  to [place] when it becomes available. We will obtain CT chest abdomen pelvis with contrast couple of months and then follow her up to discuss results.   Orders Placed This Encounter  Procedures   CT CHEST ABDOMEN PELVIS W CONTRAST    Standing Status:   Future    Standing Expiration Date:   01/04/2023    Order Specific Question:   Is patient pregnant?    Answer:   No    Order Specific Question:   Preferred imaging location?    Answer:   [facility]    Order Specific Question:   Release to patient    Answer:   Immediate    Order Specific Question:   Is Oral Contrast requested for this exam?    Answer:   Yes, Per Radiology protocol   The patient has a good understanding of the overall plan. she agrees with it. she will call with any problems that may develop before the next visit here.  Total time spent: 30 mins including face to face time and time spent for planning, charting and coordination of care  Rulon Eisenmenger, MD, MPH 01/04/2022  I, Thana Ates, am acting as scribe for April [name].  I have reviewed the above documentation for accuracy and completeness, and I agree with the above.

## 2022-01-04 ENCOUNTER — Encounter: Payer: Self-pay | Admitting: Adult Health

## 2022-01-04 ENCOUNTER — Other Ambulatory Visit: Payer: Self-pay

## 2022-01-04 ENCOUNTER — Inpatient Hospital Stay (HOSPITAL_BASED_OUTPATIENT_CLINIC_OR_DEPARTMENT_OTHER): Payer: Medicaid Other | Admitting: Hematology and Oncology

## 2022-01-04 ENCOUNTER — Inpatient Hospital Stay: Payer: Medicaid Other | Attending: Oncology

## 2022-01-04 ENCOUNTER — Other Ambulatory Visit (HOSPITAL_COMMUNITY): Payer: Self-pay

## 2022-01-04 ENCOUNTER — Inpatient Hospital Stay: Payer: Medicaid Other

## 2022-01-04 VITALS — BP 117/84 | HR 76 | Temp 97.7°F | Resp 17 | Wt 211.8 lb

## 2022-01-04 DIAGNOSIS — C787 Secondary malignant neoplasm of liver and intrahepatic bile duct: Secondary | ICD-10-CM

## 2022-01-04 DIAGNOSIS — C7951 Secondary malignant neoplasm of bone: Secondary | ICD-10-CM | POA: Diagnosis not present

## 2022-01-04 DIAGNOSIS — Z170 Estrogen receptor positive status [ER+]: Secondary | ICD-10-CM

## 2022-01-04 DIAGNOSIS — C50411 Malignant neoplasm of upper-outer quadrant of right female breast: Secondary | ICD-10-CM | POA: Insufficient documentation

## 2022-01-04 DIAGNOSIS — M810 Age-related osteoporosis without current pathological fracture: Secondary | ICD-10-CM

## 2022-01-04 DIAGNOSIS — C773 Secondary and unspecified malignant neoplasm of axilla and upper limb lymph nodes: Secondary | ICD-10-CM | POA: Insufficient documentation

## 2022-01-04 LAB — CBC WITH DIFFERENTIAL (CANCER CENTER ONLY)
Abs Immature Granulocytes: 0.01 10*3/uL (ref 0.00–0.07)
Basophils Absolute: 0 10*3/uL (ref 0.0–0.1)
Basophils Relative: 1 %
Eosinophils Absolute: 0.1 10*3/uL (ref 0.0–0.5)
Eosinophils Relative: 3 %
HCT: 36.9 % (ref 36.0–46.0)
Hemoglobin: 12.6 g/dL (ref 12.0–15.0)
Immature Granulocytes: 0 %
Lymphocytes Relative: 24 %
Lymphs Abs: 0.7 10*3/uL (ref 0.7–4.0)
MCH: 35.3 pg — ABNORMAL HIGH (ref 26.0–34.0)
MCHC: 34.1 g/dL (ref 30.0–36.0)
MCV: 103.4 fL — ABNORMAL HIGH (ref 80.0–100.0)
Monocytes Absolute: 0.2 10*3/uL (ref 0.1–1.0)
Monocytes Relative: 8 %
Neutro Abs: 1.9 10*3/uL (ref 1.7–7.7)
Neutrophils Relative %: 64 %
Platelet Count: 238 10*3/uL (ref 150–400)
RBC: 3.57 MIL/uL — ABNORMAL LOW (ref 3.87–5.11)
RDW: 12.9 % (ref 11.5–15.5)
WBC Count: 3 10*3/uL — ABNORMAL LOW (ref 4.0–10.5)
nRBC: 0 % (ref 0.0–0.2)

## 2022-01-04 LAB — CMP (CANCER CENTER ONLY)
ALT: 9 U/L (ref 0–44)
AST: 15 U/L (ref 15–41)
Albumin: 4 g/dL (ref 3.5–5.0)
Alkaline Phosphatase: 65 U/L (ref 38–126)
Anion gap: 6 (ref 5–15)
BUN: 8 mg/dL (ref 6–20)
CO2: 25 mmol/L (ref 22–32)
Calcium: 9.5 mg/dL (ref 8.9–10.3)
Chloride: 108 mmol/L (ref 98–111)
Creatinine: 1.14 mg/dL — ABNORMAL HIGH (ref 0.44–1.00)
GFR, Estimated: 55 mL/min — ABNORMAL LOW (ref >60)
Glucose, Bld: 91 mg/dL (ref 70–99)
Potassium: 4 mmol/L (ref 3.5–5.1)
Sodium: 139 mmol/L (ref 135–145)
Total Bilirubin: 0.4 mg/dL (ref 0.3–1.2)
Total Protein: 7.1 g/dL (ref 6.5–8.1)

## 2022-01-04 MED ORDER — ABEMACICLIB 100 MG PO TABS
100.0000 mg | ORAL_TABLET | Freq: Two times a day (BID) | ORAL | 6 refills | Status: DC
  Filled 2022-01-04: qty 56, 28d supply, fill #0
  Filled 2022-01-30: qty 56, 28d supply, fill #1

## 2022-01-04 MED ORDER — FULVESTRANT 250 MG/5ML IM SOSY
500.0000 mg | PREFILLED_SYRINGE | INTRAMUSCULAR | Status: DC
  Administered 2022-01-04: 500 mg via INTRAMUSCULAR
  Filled 2022-01-04: qty 10

## 2022-01-04 NOTE — Patient Instructions (Signed)
Fulvestrant injection °What is this medication? °FULVESTRANT (ful VES trant) blocks the effects of estrogen. It is used to treat breast cancer. °This medicine may be used for other purposes; ask your health care provider or pharmacist if you have questions. °COMMON BRAND NAME(S): FASLODEX °What should I tell my care team before I take this medication? °They need to know if you have any of these conditions: °bleeding disorders °liver disease °low blood counts, like low white cell, platelet, or red cell counts °an unusual or allergic reaction to fulvestrant, other medicines, foods, dyes, or preservatives °pregnant or trying to get pregnant °breast-feeding °How should I use this medication? °This medicine is for injection into a muscle. It is usually given by a health care professional in a hospital or clinic setting. °Talk to your pediatrician regarding the use of this medicine in children. Special care may be needed. °Overdosage: If you think you have taken too much of this medicine contact a poison control center or emergency room at once. °NOTE: This medicine is only for you. Do not share this medicine with others. °What if I miss a dose? °It is important not to miss your dose. Call your doctor or health care professional if you are unable to keep an appointment. °What may interact with this medication? °medicines that treat or prevent blood clots like warfarin, enoxaparin, dalteparin, apixaban, dabigatran, and rivaroxaban °This list may not describe all possible interactions. Give your health care provider a list of all the medicines, herbs, non-prescription drugs, or dietary supplements you use. Also tell them if you smoke, drink alcohol, or use illegal drugs. Some items may interact with your medicine. °What should I watch for while using this medication? °Your condition will be monitored carefully while you are receiving this medicine. You will need important blood work done while you are taking this  medicine. °Do not become pregnant while taking this medicine or for at least 1 year after stopping it. Women of child-bearing potential will need to have a negative pregnancy test before starting this medicine. Women should inform their doctor if they wish to become pregnant or think they might be pregnant. There is a potential for serious side effects to an unborn child. Men should inform their doctors if they wish to father a child. This medicine may lower sperm counts. Talk to your health care professional or pharmacist for more information. Do not breast-feed an infant while taking this medicine or for 1 year after the last dose. °What side effects may I notice from receiving this medication? °Side effects that you should report to your doctor or health care professional as soon as possible: °allergic reactions like skin rash, itching or hives, swelling of the face, lips, or tongue °feeling faint or lightheaded, falls °pain, tingling, numbness, or weakness in the legs °signs and symptoms of infection like fever or chills; cough; flu-like symptoms; sore throat °vaginal bleeding °Side effects that usually do not require medical attention (report to your doctor or health care professional if they continue or are bothersome): °aches, pains °constipation °diarrhea °headache °hot flashes °nausea, vomiting °pain at site where injected °stomach pain °This list may not describe all possible side effects. Call your doctor for medical advice about side effects. You may report side effects to FDA at 1-800-FDA-1088. °Where should I keep my medication? °This drug is given in a hospital or clinic and will not be stored at home. °NOTE: This sheet is a summary. It may not cover all possible information. If you have   questions about this medicine, talk to your doctor, pharmacist, or health care provider. °© 2022 Elsevier/Gold Standard (2018-03-05 00:00:00) ° °

## 2022-01-05 LAB — CANCER ANTIGEN 27.29: CA 27.29: 208.6 U/mL — ABNORMAL HIGH (ref 0.0–38.6)

## 2022-01-25 ENCOUNTER — Encounter: Payer: Self-pay | Admitting: Adult Health

## 2022-01-30 ENCOUNTER — Other Ambulatory Visit (HOSPITAL_COMMUNITY): Payer: Self-pay

## 2022-01-30 ENCOUNTER — Ambulatory Visit (HOSPITAL_COMMUNITY)
Admission: RE | Admit: 2022-01-30 | Discharge: 2022-01-30 | Disposition: A | Discharge status: Home / Self Care | Payer: Medicaid Other | Source: Ambulatory Visit | Attending: Hematology and Oncology | Admitting: Hematology and Oncology

## 2022-01-30 DIAGNOSIS — C50411 Malignant neoplasm of upper-outer quadrant of right female breast: Secondary | ICD-10-CM | POA: Diagnosis not present

## 2022-01-30 DIAGNOSIS — Z170 Estrogen receptor positive status [ER+]: Secondary | ICD-10-CM | POA: Insufficient documentation

## 2022-01-30 DIAGNOSIS — C787 Secondary malignant neoplasm of liver and intrahepatic bile duct: Secondary | ICD-10-CM | POA: Insufficient documentation

## 2022-01-30 MED ORDER — SODIUM CHLORIDE (PF) 0.9 % IJ SOLN
INTRAMUSCULAR | Status: AC
  Filled 2022-01-30: qty 50

## 2022-01-30 MED ORDER — IOHEXOL 300 MG/ML  SOLN
100.0000 mL | Freq: Once | INTRAMUSCULAR | Status: AC | PRN
  Administered 2022-01-30: 100 mL via INTRAVENOUS

## 2022-01-31 NOTE — Progress Notes (Signed)
Patient Care Team: [name], MD as PCP - General (Internal Medicine) [name], MD as Attending Physician (Radiation Oncology) [name], [name], NP as Nurse Practitioner (Hematology and Oncology) [name], MD as Consulting Physician (General Surgery) [name], DPM as Consulting Physician (Podiatry) [name], MD as Consulting Physician (Gastroenterology) [name], MD as Consulting Physician (Hematology and Oncology)  DIAGNOSIS:    ICD-10-CM   1. Malignant neoplasm of upper-outer quadrant of right breast in female, estrogen receptor positive ([place])  C50.411    Z17.0       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[place])  06/21/2016 Initial Diagnosis   Breast cancer metastasized to axillary lymph node ([place])   Malignant neoplasm of upper-outer quadrant of right breast in female, estrogen receptor positive ([place])  06/21/2016 Initial Diagnosis   Malignant neoplasm of upper-outer quadrant of right breast in female, estrogen receptor positive ([place])   06/21/2016 Initial Biopsy   Right breast biopsy, 10 oclock: IDC, grade 3, ER+(95%), PR+(80%),Ki67 20%, HER-2 negative (ratio 0.67). Right axilla core biopsy: carcinoma, grade 3, ER+(100%), PR+(90%), Ki67 25%, HER-2 negative (ratio 1.13).    07/17/2016 - 11/06/2016 Neo-Adjuvant Chemotherapy   Received 2 cycles of Doxorubicin and Cyclophosphamide, then transitioned to weekly Paclitaxel (due to repeated febrile neutropenia) x 7 cycles, stopped early due to peripheral neuropathy, then completed her final 2 cycles of Doxorubicin and Cyclophosphamide.    12/19/2016 Surgery   Right breast lumpectomy ([facility]): IDC, grade 2, 1.6cm and 0.3cm, margins negative, 3 SLN positive for metastatic carcinoma.     12/26/2016 Surgery   ALND: metastatic carcinoma in one of 20 lymph nodes, and three nodes from previous lumpectomy.  Four positive nodes, consistent  with pN2a.   02/07/2017 - 03/21/2017 Radiation Therapy   Adjuvant radiation Isidore Moos): 1) Right breast and nodes - 4 field: 50 Gy in 25 fractions. IM NODES: >95% receive at least 45Gy/48fx. 50Gy to SCLV/PAB @ 2Gy /fraction x 25 fractions. 2) Right breast boost: 10 Gy in 5 fractions   04/2017 -  Anti-estrogen oral therapy   Anastrozole $RemoveBefo'1mg'wGBZquUYTgS$  daily.  Bone density 03/23/2017 finds T score of -2.6, osteoporosis, plan to start Prolia following dental clearance Anastrozole stopped 01/16/18 Exemestane 25 mg daily 02/12/18  On [name], trial randomized to endocrine therapy alone   05/27/2020 Progression   chest CT scan 05/27/2020 shows bulky mediastinal and right hilar lymphadenopathy with right pleural nodules and a small right pleural effusion, no evidence of liver or bone involvement             (a) biopsy of right breast mass 06/02/2020 shows invasive ductal carcinoma, estrogen and progesterone receptor positive, HER-2 not amplified, with an MIB-1 of 40%   06/17/2020 Treatment Plan Change    fulvestrant to start 06/17/2020             (a) palbociclib to start 06/17/2020 at 125 mg daily 21 days on 7 days off             (b) palbociclib decreased to $RemoveBefo'125mg'LSeagSKxOQy$  every other day x 11 doses on 07/23/2020             (c) palbociclib dose decreased to100 mg daily, 21/7, starting with September cycle             (d) Palbociclib decreased to $RemoveBefo'75mg'kxLaPctLmKN$  daily beginning with March cycle due to oral ulcers             (e) palbociclib  discontinued November 2022 with progression   09/14/2021 PET scan   PET scan 09/14/2021 shows progression in liver and bone   10/05/2021 Pathology Results   liver biopsy 10/05/2021 confirms metastatic carcinoma, estrogen receptor strongly positive, progesterone receptor and HER2 negative, with an MIB-1 of 30%.   10/2021 Treatment Plan Change   fulvestrant continued, Xgeva added, palbociclib changed to abemaciclib November 2022 -Dose Decreased to $RemoveBefo'50mg'xlvGPwehzBI$  PO BID Daily     CHIEF COMPLIANT: Follow-up  of metastatic breast cancer  INTERVAL HISTORY: April [name] is a 61 y.o. with above-mentioned history of ER+ breast cancer. She presents to the clinic today for follow-up.  She had CT scans and is here today to discuss results.  She was tolerating the Verzenio Faslodex regimen fairly well.  She has occasional loose stool but denies any diarrhea.  Overall she is tolerating the 100 mg of Verzenio extremely well.  She had CT scans 3 months with the treatment and is here today to discuss results.  ALLERGIES:  is allergic to cymbalta [duloxetine hcl], hydrocodone, ultram [tramadol hcl], venlafaxine, and gabapentin.  MEDICATIONS:  Current Outpatient Medications  Medication Sig Dispense Refill   abemaciclib (VERZENIO) 100 MG tablet Take 1 tablet (100 mg total) by mouth 2 (two) times daily. Swallow tablets whole. Do not chew, crush, or split tablets before swallowing. 60 tablet 6   cetirizine (ZYRTEC) 10 MG tablet Take 10 mg by mouth daily.      Denosumab (XGEVA [place]) Inject 120 mg into the skin every 30 (thirty) days.     famotidine (PEPCID) 20 MG tablet Take 20 mg by mouth daily.     Fulvestrant (FASLODEX IM) Inject 500 mg into the muscle every 30 (thirty) days. Starting Monthly with next dose. Injection given at [facility].     naproxen sodium (ALEVE) 220 MG tablet Take 660 mg by mouth 3 (three) times daily as needed (pain).     ondansetron (ZOFRAN) 8 MG tablet Take 1 tablet (8 mg total) by mouth every 8 (eight) hours as needed for nausea or vomiting. 30 tablet 1   valACYclovir (VALTREX) 1000 MG tablet Take 1 tablet (1,000 mg total) by mouth 2 (two) times daily. 1/2 tab BID 180 tablet 4   No current facility-administered medications for this visit.    PHYSICAL EXAMINATION: ECOG PERFORMANCE STATUS: 1 - Symptomatic but completely ambulatory  Vitals:   02/01/22 1420  BP: 132/87  Pulse: 81  Resp: 19  Temp: (!) 97.3 F (36.3 C)  SpO2: 99%   Filed Weights   02/01/22 1420  Weight: 217 lb 1.6 oz  (98.5 kg)      LABORATORY DATA:  I have reviewed the data as listed CMP Latest Ref Rng & Units 01/04/2022 12/07/2021 11/09/2021  Glucose 70 - 99 mg/dL 91 81 100(H)  BUN 6 - 20 mg/dL $Remove'8 18 13  'ZXVcjCK$ Creatinine 0.44 - 1.00 mg/dL 1.14(H) 1.01(H) 0.99  Sodium 135 - 145 mmol/L 139 138 141  Potassium 3.5 - 5.1 mmol/L 4.0 4.0 3.6  Chloride 98 - 111 mmol/L 108 105 111  CO2 22 - 32 mmol/L 25 27 21(L)  Calcium 8.9 - 10.3 mg/dL 9.5 9.5 8.7(L)  Total Protein 6.5 - 8.1 g/dL 7.1 6.9 6.7  Total Bilirubin 0.3 - 1.2 mg/dL 0.4 0.3 0.3  Alkaline Phos 38 - 126 U/L 65 65 56  AST 15 - 41 U/L 15 13(L) 14(L)  ALT 0 - 44 U/L $Remo'9 9 12    'hZrkw$ Lab Results  Component Value Date  WBC 3.0 (L) 02/01/2022   HGB 11.5 (L) 02/01/2022   HCT 33.0 (L) 02/01/2022   MCV 101.9 (H) 02/01/2022   PLT 179 02/01/2022   NEUTROABS 1.9 02/01/2022    ASSESSMENT & PLAN:  Malignant neoplasm of upper-outer quadrant of right breast in female, estrogen receptor positive ([place]) female, estrogen receptor positive ([place]) Metastatic breast cancer Current treatment: Abemaciclib, fulvestrant, Xgeva   1.  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 On abemaciclib at 50 mg p.o. daily without any further diarrhea. We will increase the dosage of Verzinio to 100 mg bid   2. bone metastasis: I recommend moving Xgeva to every 3 months.   3.  Chemotherapy-induced peripheral neuropathy.  This is improving and now intermittent only noticeable in her toes.  She is not having any pain or balance issues related to this.   We discussed about switching her from Faslodex to [place] when it becomes available. CT CAP 02/01/2022: Mild progression of disease with increased number and size of the numerous lung nodules in the right lung.  Persistent lymphadenopathy and right hilar and right paratracheal nodal areas, numerous hypovascular hepatic lesions slightly larger when compared to previous.  Plan:  [facility] molecular testing to determine if she would benefit from targeted therapies If there are no mutations then we will consider treatment with Xeloda. Previously HER2 1+ by IHC: Patient candidate for Enhertu. We also discussed that potential future options include Sacituzumab-Govitecan as well as other chemotherapy options.  If ESR 1 mutation is positive then we can consider Elacestrant as well. Return to clinic in 4 weeks for follow-up to discuss her treatment plan.  Until then she will continue with the Verzenio and Faslodex.  No orders of the defined types were placed in this encounter.  The patient has a good understanding of the overall plan. she agrees with it. she will call with any problems that may develop before the next visit here.  Total time spent: 20 mins including face to face time and time spent for planning, charting and coordination of care  Rulon Eisenmenger, MD, MPH 02/01/2022  I, Thana Ates, am acting as scribe for Dr. [name].  I have reviewed the above documentation for accuracy and completeness, and I agree with the above.

## 2022-02-01 ENCOUNTER — Inpatient Hospital Stay: Payer: Medicaid Other | Attending: Oncology

## 2022-02-01 ENCOUNTER — Other Ambulatory Visit: Payer: Self-pay

## 2022-02-01 ENCOUNTER — Inpatient Hospital Stay: Payer: Medicaid Other

## 2022-02-01 ENCOUNTER — Ambulatory Visit: Payer: Medicaid Other

## 2022-02-01 ENCOUNTER — Other Ambulatory Visit: Payer: Medicaid Other

## 2022-02-01 ENCOUNTER — Inpatient Hospital Stay (HOSPITAL_BASED_OUTPATIENT_CLINIC_OR_DEPARTMENT_OTHER): Payer: Medicaid Other | Admitting: Hematology and Oncology

## 2022-02-01 ENCOUNTER — Other Ambulatory Visit (HOSPITAL_COMMUNITY): Payer: Self-pay

## 2022-02-01 DIAGNOSIS — C773 Secondary and unspecified malignant neoplasm of axilla and upper limb lymph nodes: Secondary | ICD-10-CM | POA: Diagnosis not present

## 2022-02-01 DIAGNOSIS — Z170 Estrogen receptor positive status [ER+]: Secondary | ICD-10-CM

## 2022-02-01 DIAGNOSIS — M810 Age-related osteoporosis without current pathological fracture: Secondary | ICD-10-CM

## 2022-02-01 DIAGNOSIS — G620 Drug-induced polyneuropathy: Secondary | ICD-10-CM | POA: Insufficient documentation

## 2022-02-01 DIAGNOSIS — C50411 Malignant neoplasm of upper-outer quadrant of right female breast: Secondary | ICD-10-CM | POA: Insufficient documentation

## 2022-02-01 DIAGNOSIS — C7951 Secondary malignant neoplasm of bone: Secondary | ICD-10-CM | POA: Diagnosis not present

## 2022-02-01 DIAGNOSIS — Z5111 Encounter for antineoplastic chemotherapy: Secondary | ICD-10-CM | POA: Insufficient documentation

## 2022-02-01 DIAGNOSIS — C787 Secondary malignant neoplasm of liver and intrahepatic bile duct: Secondary | ICD-10-CM | POA: Diagnosis not present

## 2022-02-01 LAB — CBC WITH DIFFERENTIAL (CANCER CENTER ONLY)
Abs Immature Granulocytes: 0.01 10*3/uL (ref 0.00–0.07)
Basophils Absolute: 0 10*3/uL (ref 0.0–0.1)
Basophils Relative: 1 %
Eosinophils Absolute: 0.1 10*3/uL (ref 0.0–0.5)
Eosinophils Relative: 3 %
HCT: 33 % — ABNORMAL LOW (ref 36.0–46.0)
Hemoglobin: 11.5 g/dL — ABNORMAL LOW (ref 12.0–15.0)
Immature Granulocytes: 0 %
Lymphocytes Relative: 25 %
Lymphs Abs: 0.8 10*3/uL (ref 0.7–4.0)
MCH: 35.5 pg — ABNORMAL HIGH (ref 26.0–34.0)
MCHC: 34.8 g/dL (ref 30.0–36.0)
MCV: 101.9 fL — ABNORMAL HIGH (ref 80.0–100.0)
Monocytes Absolute: 0.2 10*3/uL (ref 0.1–1.0)
Monocytes Relative: 8 %
Neutro Abs: 1.9 10*3/uL (ref 1.7–7.7)
Neutrophils Relative %: 63 %
Platelet Count: 179 10*3/uL (ref 150–400)
RBC: 3.24 MIL/uL — ABNORMAL LOW (ref 3.87–5.11)
RDW: 12.6 % (ref 11.5–15.5)
WBC Count: 3 10*3/uL — ABNORMAL LOW (ref 4.0–10.5)
nRBC: 0 % (ref 0.0–0.2)

## 2022-02-01 LAB — CMP (CANCER CENTER ONLY)
ALT: 21 U/L (ref 0–44)
AST: 24 U/L (ref 15–41)
Albumin: 3.6 g/dL (ref 3.5–5.0)
Alkaline Phosphatase: 63 U/L (ref 38–126)
Anion gap: 6 (ref 5–15)
BUN: 14 mg/dL (ref 6–20)
CO2: 25 mmol/L (ref 22–32)
Calcium: 8.7 mg/dL — ABNORMAL LOW (ref 8.9–10.3)
Chloride: 109 mmol/L (ref 98–111)
Creatinine: 1 mg/dL (ref 0.44–1.00)
GFR, Estimated: >60 mL/min (ref >60)
Glucose, Bld: 93 mg/dL (ref 70–99)
Potassium: 4 mmol/L (ref 3.5–5.1)
Sodium: 140 mmol/L (ref 135–145)
Total Bilirubin: <0.3 mg/dL — ABNORMAL LOW (ref 0.3–1.2)
Total Protein: 6.7 g/dL (ref 6.5–8.1)

## 2022-02-01 MED ORDER — SODIUM CHLORIDE 0.9 % IV SOLN: INTRAVENOUS | Status: AC

## 2022-02-01 MED ORDER — FULVESTRANT 250 MG/5ML IM SOSY
500.0000 mg | PREFILLED_SYRINGE | INTRAMUSCULAR | Status: DC
  Administered 2022-02-01: 500 mg via INTRAMUSCULAR
  Filled 2022-02-01 (×2): qty 10

## 2022-02-01 NOTE — Progress Notes (Signed)
[facility] faxed to [phone] ?

## 2022-02-01 NOTE — Assessment & Plan Note (Signed)
female, estrogen receptor positive ([place]) ?Metastatic breast cancer ?Current treatment: Abemaciclib, fulvestrant, Xgeva ?? ?1.  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 On abemaciclib at 50 mg p.o. daily without any further diarrhea. ?We will increase the dosage of Verzinio to 100 mg bid ?? ?2. bone metastasis: I recommend moving Xgeva to every 3 months. ?? ?3.  Chemotherapy-induced peripheral neuropathy.  This is improving and now intermittent only noticeable in her toes.  She is not having any pain or balance issues related to this. ?? ?We discussed about switching her from Faslodex to [place] when it becomes available. ?We will obtain CT chest abdomen pelvis with contrast couple of months and then follow her up to discuss results. ?

## 2022-02-02 ENCOUNTER — Telehealth: Payer: Self-pay | Admitting: Hematology and Oncology

## 2022-02-02 NOTE — Telephone Encounter (Signed)
Scheduled appointment per 3/1 los. Patient is aware of upcoming appointment. ?

## 2022-02-04 LAB — CANCER ANTIGEN 27.29: CA 27.29: 226.2 U/mL — ABNORMAL HIGH (ref 0.0–38.6)

## 2022-02-15 ENCOUNTER — Encounter: Payer: Self-pay | Admitting: Adult Health

## 2022-02-27 ENCOUNTER — Other Ambulatory Visit (HOSPITAL_COMMUNITY): Payer: Self-pay

## 2022-02-28 DIAGNOSIS — C50411 Malignant neoplasm of upper-outer quadrant of right female breast: Secondary | ICD-10-CM | POA: Diagnosis not present

## 2022-02-28 DIAGNOSIS — Z170 Estrogen receptor positive status [ER+]: Secondary | ICD-10-CM | POA: Diagnosis not present

## 2022-02-28 DIAGNOSIS — C787 Secondary malignant neoplasm of liver and intrahepatic bile duct: Secondary | ICD-10-CM | POA: Diagnosis not present

## 2022-03-03 NOTE — Progress Notes (Signed)
? ?Patient Care Team: ?[name], MD as PCP - General (Internal Medicine) ?[name], MD as Attending Physician (Radiation Oncology) ?Gardenia Phlegm, NP as Nurse Practitioner (Hematology and Oncology) ?[name], MD as Consulting Physician (General Surgery) ?[name], DPM as Consulting Physician (Podiatry) ?[name], MD as Consulting Physician (Gastroenterology) ?[name], MD as Consulting Physician (Hematology and Oncology) ? ?DIAGNOSIS:  ?Encounter Diagnosis  ?Name Primary?  ? Malignant neoplasm of upper-outer quadrant of right breast in female, estrogen receptor positive ([place])   ? ? ?SUMMARY OF ONCOLOGIC HISTORY: ?Oncology History  ?Breast cancer metastasized to axillary lymph node ([place])  ?06/21/2016 Initial Diagnosis  ? Breast cancer metastasized to axillary lymph node ([place]) ?  ?Malignant neoplasm of upper-outer quadrant of right breast in female, estrogen receptor positive ([place])  ?06/21/2016 Initial Diagnosis  ? Malignant neoplasm of upper-outer quadrant of right breast in female, estrogen receptor positive ([place]) ?  ?06/21/2016 Initial Biopsy  ? Right breast biopsy, 10 oclock: IDC, grade 3, ER+(95%), PR+(80%),Ki67 20%, HER-2 negative (ratio 0.67). ?Right axilla core biopsy: carcinoma, grade 3, ER+(100%), PR+(90%), Ki67 25%, HER-2 negative (ratio 1.13).  ?  ?07/17/2016 - 11/06/2016 Neo-Adjuvant Chemotherapy  ? Received 2 cycles of Doxorubicin and Cyclophosphamide, then transitioned to weekly Paclitaxel (due to repeated febrile neutropenia) x 7 cycles, stopped early due to peripheral neuropathy, then completed her final 2 cycles of Doxorubicin and Cyclophosphamide.  ?  ?12/19/2016 Surgery  ? Right breast lumpectomy ([facility]): IDC, grade 2, 1.6cm and 0.3cm, margins negative, 3 SLN positive for metastatic carcinoma.   ?  ?12/26/2016 Surgery  ? ALND: metastatic carcinoma in one of 20 lymph nodes, and three nodes from previous lumpectomy.  Four positive nodes, consistent  with pN2a. ?  ?02/07/2017 - 03/21/2017 Radiation Therapy  ? Adjuvant radiation Isidore Moos): 1) Right breast and nodes - 4 field: 50 Gy in 25 fractions. IM NODES: >95% receive at least 45Gy/74f. 50Gy to SCLV/PAB @ 2Gy /fraction x 25 fractions. 2) Right breast boost: 10 Gy in 5 fractions ?  ?04/2017 -  Anti-estrogen oral therapy  ? Anastrozole 140mdaily.  Bone density 03/23/2017 finds T score of -2.6, osteoporosis, plan to start Prolia following dental clearance ?Anastrozole stopped 01/16/18 ?Exemestane 25 mg daily 02/12/18 ? ?On [name], trial randomized to endocrine therapy alone ?  ?05/27/2020 Progression  ? chest CT scan 05/27/2020 shows bulky mediastinal and right hilar lymphadenopathy with right pleural nodules and a small right pleural effusion, no evidence of liver or bone involvement ?            (a) biopsy of right breast mass 06/02/2020 shows invasive ductal carcinoma, estrogen and progesterone receptor positive, HER-2 not amplified, with an MIB-1 of 40% ?  ?06/17/2020 Treatment Plan Change  ?  fulvestrant to start 06/17/2020 ?            (a) palbociclib to start 06/17/2020 at 125 mg daily 21 days on 7 days off ?            (b) palbociclib decreased to 12536mvery other day x 11 doses on 07/23/2020 ?            (c) palbociclib dose decreased to100 mg daily, 21/7, starting with September cycle ?            (d) Palbociclib decreased to 8m50mily beginning with March cycle due to oral ulcers ?            (e) palbociclib discontinued November 2022 with progression ?  ?  09/14/2021 PET scan  ? PET scan 09/14/2021 shows progression in liver and bone ?  ?10/05/2021 Pathology Results  ? liver biopsy 10/05/2021 confirms metastatic carcinoma, estrogen receptor strongly positive, progesterone receptor and HER2 negative, with an MIB-1 of 30%. ?  ?10/2021 Treatment Plan Change  ? fulvestrant continued, Xgeva added, palbociclib changed to abemaciclib November 2022 ?-Dose Decreased to 54m PO BID Daily ?  ? ? ?CHIEF COMPLIANT: Follow-up  to discuss ct chest abdomen results. ?  ? ?INTERVAL HISTORY: April MCCANNONis a 61y.o. with above-mentioned history of ER+ breast cancer. She presents to the clinic today to discuss ct chest scan and follow-up.  She had molecular testing with [facility] and those results are also pending.  She is here to discuss changing treatment and starting Xeloda. ? ? ?ALLERGIES:  is allergic to cymbalta [duloxetine hcl], hydrocodone, ultram [tramadol hcl], venlafaxine, and gabapentin. ? ?MEDICATIONS:  ?Current Outpatient Medications  ?Medication Sig Dispense Refill  ? capecitabine (XELODA) 500 MG tablet Take 3 tablets (1,500 mg total) by mouth 2 (two) times daily after a meal. 14 days on and 7 days off 84 tablet 3  ? cetirizine (ZYRTEC) 10 MG tablet Take 10 mg by mouth daily.     ? Denosumab (XGEVA [place]) Inject 120 mg into the skin every 30 (thirty) days.    ? famotidine (PEPCID) 20 MG tablet Take 20 mg by mouth daily.    ? Fulvestrant (FASLODEX IM) Inject 500 mg into the muscle every 30 (thirty) days. Starting Monthly with next dose[facility]    ? naproxen sodium (ALEVE) 220 MG tablet Take 660 mg by mouth 3 (three) times daily as needed (pain).    ? ondansetron (ZOFRAN) 8 MG tablet Take 1 tablet (8 mg total) by mouth every 8 (eight) hours as needed for nausea or vomiting. 30 tablet 1  ? valACYclovir (VALTREX) 1000 MG tablet Take 1 tablet (1,000 mg total) by mouth 2 (two) times daily. 1/2 tab BID 180 tablet 4  ? ?No current facility-administered medications for this visit.  ? ?Facility-Administered Medications Ordered in Other Visits  ?Medication Dose Route Frequency Provider Last Rate Last Admin  ? 0.9 %  sodium chloride infusion   Intravenous Continuous Causey, LCharlestine Massed NP      ? fulvestrant (FASLODEX) injection 500 mg  500 mg Intramuscular Q30 days CGardenia Phlegm NP   500 mg at 03/06/22 1541  ? ? ?PHYSICAL EXAMINATION: ?ECOG PERFORMANCE STATUS: 1 - Symptomatic but completely  ambulatory ? ?Vitals:  ? 03/06/22 1428  ?BP: (!) 124/93  ?Pulse: 95  ?Resp: 19  ?Temp: (!) 97.5 ?F (36.4 ?C)  ?SpO2: 97%  ? ?Filed Weights  ? 03/06/22 1428  ?Weight: 209 lb 11.2 oz (95.1 kg)  ? ?  ? ?LABORATORY DATA:  ?I have reviewed the data as listed ? ?  Latest Ref Rng & Units 03/06/2022  ?  2:16 PM 02/01/2022  ?  1:54 PM 01/04/2022  ?  9:49 AM  ?CMP  ?Glucose 70 - 99 mg/dL 100   93   91    ?BUN 6 - 20 mg/dL _0 ?Creatinine 0.44 - 1.00 mg/dL 1.16   1.00   1.14    ?Sodium 135 - 145 mmol/L 141   140   139    ?Potassium 3.5 - 5.1 mmol/L 4.1   4.0   4.0    ?Chloride 98 - 111 mmol/L 108  109   108    ?CO2 22 - 32 mmol/L _0 ?Calcium 8.9 - 10.3 mg/dL 9.6   8.7   9.5    ?Total Protein 6.5 - 8.1 g/dL 7.3   6.7   7.1    ?Total Bilirubin 0.3 - 1.2 mg/dL 0.3   <0.3   0.4    ?Alkaline Phos 38 - 126 U/L 61   63   65    ?AST 15 - 41 U/L _1 ?ALT 0 - 44 U/L _2 ? ? ?Lab Results  ?Component Value Date  ? WBC 3.3 (L) 03/06/2022  ? HGB 12.7 03/06/2022  ? HCT 36.7 03/06/2022  ? MCV 102.8 (H) 03/06/2022  ? PLT 230 03/06/2022  ? NEUTROABS 2.4 03/06/2022  ? ? ?ASSESSMENT & PLAN:  ?Malignant neoplasm of upper-outer quadrant of right breast in female, estrogen receptor positive ([place]) ?Current treatment: Abemaciclib, fulvestrant, Xgeva ?  ?1.  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  ?On Verzinio to 100 mg bid ?  ?2. bone metastasis: Currently on Xgeva every 3 months. ?  ?3.  Chemotherapy-induced peripheral neuropathy.  This is improving and now intermittent only noticeable in her toes.  She is not having any pain or balance issues related to this. ? ?CT CAP 02/01/2022: Mild progression of disease with increased number and size of the numerous lung nodules in the right lung.  Persistent lymphadenopathy and right hilar and right paratracheal nodal areas, numerous hypovascular hepatic lesions slightly larger when compared to  previous. ? ?CA 27-29: 226 on 02/01/2022 (gradually progressing) ?  ?Plan: [facility] molecular testing t: It appears that there is some issue around and insurance and therefore the test is currently being reported as pending. ? ?Pr

## 2022-03-06 ENCOUNTER — Other Ambulatory Visit: Payer: Self-pay

## 2022-03-06 ENCOUNTER — Encounter: Payer: Self-pay | Admitting: Adult Health

## 2022-03-06 ENCOUNTER — Inpatient Hospital Stay: Payer: Medicaid Other | Attending: Oncology

## 2022-03-06 ENCOUNTER — Other Ambulatory Visit (HOSPITAL_COMMUNITY): Payer: Self-pay

## 2022-03-06 ENCOUNTER — Inpatient Hospital Stay: Payer: Medicaid Other

## 2022-03-06 ENCOUNTER — Inpatient Hospital Stay (HOSPITAL_BASED_OUTPATIENT_CLINIC_OR_DEPARTMENT_OTHER): Payer: Medicaid Other | Admitting: Hematology and Oncology

## 2022-03-06 ENCOUNTER — Telehealth: Payer: Self-pay

## 2022-03-06 DIAGNOSIS — Z5111 Encounter for antineoplastic chemotherapy: Secondary | ICD-10-CM | POA: Insufficient documentation

## 2022-03-06 DIAGNOSIS — C773 Secondary and unspecified malignant neoplasm of axilla and upper limb lymph nodes: Secondary | ICD-10-CM | POA: Insufficient documentation

## 2022-03-06 DIAGNOSIS — C50411 Malignant neoplasm of upper-outer quadrant of right female breast: Secondary | ICD-10-CM | POA: Insufficient documentation

## 2022-03-06 DIAGNOSIS — Z79899 Other long term (current) drug therapy: Secondary | ICD-10-CM | POA: Insufficient documentation

## 2022-03-06 DIAGNOSIS — T451X5A Adverse effect of antineoplastic and immunosuppressive drugs, initial encounter: Secondary | ICD-10-CM | POA: Diagnosis not present

## 2022-03-06 DIAGNOSIS — G620 Drug-induced polyneuropathy: Secondary | ICD-10-CM | POA: Diagnosis not present

## 2022-03-06 DIAGNOSIS — Z79818 Long term (current) use of other agents affecting estrogen receptors and estrogen levels: Secondary | ICD-10-CM | POA: Diagnosis not present

## 2022-03-06 DIAGNOSIS — Z170 Estrogen receptor positive status [ER+]: Secondary | ICD-10-CM

## 2022-03-06 DIAGNOSIS — C7951 Secondary malignant neoplasm of bone: Secondary | ICD-10-CM | POA: Insufficient documentation

## 2022-03-06 DIAGNOSIS — C787 Secondary malignant neoplasm of liver and intrahepatic bile duct: Secondary | ICD-10-CM | POA: Insufficient documentation

## 2022-03-06 DIAGNOSIS — M810 Age-related osteoporosis without current pathological fracture: Secondary | ICD-10-CM

## 2022-03-06 LAB — CMP (CANCER CENTER ONLY)
ALT: 13 U/L (ref 0–44)
AST: 18 U/L (ref 15–41)
Albumin: 3.9 g/dL (ref 3.5–5.0)
Alkaline Phosphatase: 61 U/L (ref 38–126)
Anion gap: 7 (ref 5–15)
BUN: 13 mg/dL (ref 6–20)
CO2: 26 mmol/L (ref 22–32)
Calcium: 9.6 mg/dL (ref 8.9–10.3)
Chloride: 108 mmol/L (ref 98–111)
Creatinine: 1.16 mg/dL — ABNORMAL HIGH (ref 0.44–1.00)
GFR, Estimated: 54 mL/min — ABNORMAL LOW (ref >60)
Glucose, Bld: 100 mg/dL — ABNORMAL HIGH (ref 70–99)
Potassium: 4.1 mmol/L (ref 3.5–5.1)
Sodium: 141 mmol/L (ref 135–145)
Total Bilirubin: 0.3 mg/dL (ref 0.3–1.2)
Total Protein: 7.3 g/dL (ref 6.5–8.1)

## 2022-03-06 LAB — CBC WITH DIFFERENTIAL (CANCER CENTER ONLY)
Abs Immature Granulocytes: 0.01 10*3/uL (ref 0.00–0.07)
Basophils Absolute: 0 10*3/uL (ref 0.0–0.1)
Basophils Relative: 1 %
Eosinophils Absolute: 0.1 10*3/uL (ref 0.0–0.5)
Eosinophils Relative: 2 %
HCT: 36.7 % (ref 36.0–46.0)
Hemoglobin: 12.7 g/dL (ref 12.0–15.0)
Immature Granulocytes: 0 %
Lymphocytes Relative: 20 %
Lymphs Abs: 0.7 10*3/uL (ref 0.7–4.0)
MCH: 35.6 pg — ABNORMAL HIGH (ref 26.0–34.0)
MCHC: 34.6 g/dL (ref 30.0–36.0)
MCV: 102.8 fL — ABNORMAL HIGH (ref 80.0–100.0)
Monocytes Absolute: 0.2 10*3/uL (ref 0.1–1.0)
Monocytes Relative: 6 %
Neutro Abs: 2.4 10*3/uL (ref 1.7–7.7)
Neutrophils Relative %: 71 %
Platelet Count: 230 10*3/uL (ref 150–400)
RBC: 3.57 MIL/uL — ABNORMAL LOW (ref 3.87–5.11)
RDW: 13.2 % (ref 11.5–15.5)
WBC Count: 3.3 10*3/uL — ABNORMAL LOW (ref 4.0–10.5)
nRBC: 0 % (ref 0.0–0.2)

## 2022-03-06 MED ORDER — CAPECITABINE 500 MG PO TABS
1500.0000 mg | ORAL_TABLET | Freq: Two times a day (BID) | ORAL | 3 refills | Status: DC
  Filled 2022-03-06: qty 84, 14d supply, fill #0
  Filled 2022-03-08: qty 84, 21d supply, fill #0
  Filled 2022-03-20: qty 84, 21d supply, fill #1
  Filled 2022-04-10: qty 84, 21d supply, fill #2
  Filled 2022-05-04: qty 84, 21d supply, fill #3

## 2022-03-06 MED ORDER — DENOSUMAB 120 MG/1.7ML ~~LOC~~ SOLN
120.0000 mg | Freq: Once | SUBCUTANEOUS | Status: AC
  Administered 2022-03-06: 120 mg via SUBCUTANEOUS
  Filled 2022-03-06: qty 1.7

## 2022-03-06 MED ORDER — FULVESTRANT 250 MG/5ML IM SOSY
500.0000 mg | PREFILLED_SYRINGE | INTRAMUSCULAR | Status: DC
  Administered 2022-03-06: 500 mg via INTRAMUSCULAR
  Filled 2022-03-06: qty 10

## 2022-03-06 MED ORDER — SODIUM CHLORIDE 0.9 % IV SOLN: INTRAVENOUS | Status: AC

## 2022-03-06 NOTE — Assessment & Plan Note (Signed)
Current treatment: Abemaciclib, fulvestrant, Xgeva ?? ?1. 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  ?On?Verzinio to 100 mg bid ?? ?2.?bone metastasis: Currently on Xgeva every 3 months. ?? ?3. ?Chemotherapy-induced peripheral neuropathy. ?This is improving and now intermittent only noticeable in her toes. ?She is not having any pain or balance issues related to this. ? ?CT CAP 02/01/2022: Mild progression of disease with increased number and size of the numerous lung nodules in the right lung.  Persistent lymphadenopathy and right hilar and right paratracheal nodal areas, numerous hypovascular hepatic lesions slightly larger when compared to previous. ? ?CA 27-29: 226 on 02/01/2022 (gradually progressing) ?? ?Plan: [facility] molecular testing to determine if she would benefit from targeted therapies ?If there are no mutations then we will consider treatment with Xeloda. ?Previously HER2 1+ by IHC: Patient candidate for Enhertu. ?We also discussed that potential future options include Sacituzumab-Govitecan as well as other chemotherapy options. ?? ?If ESR 1 mutation is positive then we can consider Elacestrant as well. ?

## 2022-03-06 NOTE — Telephone Encounter (Signed)
Oral Oncology Patient Advocate Encounter ? ?After completing a benefits investigation, prior authorization for Xeloda is not required at this time through [facility]. ? ?Patient's copay is $0.   ? ?Wynn Maudlin CPHT ?Specialty Pharmacy Patient Advocate ?[place] ?Phone [phone] ?Fax [phone] ?03/06/2022 3:31 PM ? ? ?  ? ?

## 2022-03-07 LAB — CANCER ANTIGEN 27.29: CA 27.29: 286.7 U/mL — ABNORMAL HIGH (ref 0.0–38.6)

## 2022-03-07 NOTE — Telephone Encounter (Signed)
Oral Oncology Pharmacist Encounter ? ?Received new prescription for capecitabine (Xeloda) for the treatment of metastatic breast cancer planned duration until disease progression or unacceptable toxicity. Prescription dose and frequency assessed. ? ?Labs from 03/06/22 assessed, no interventions needed. ? ?Current medication list in Epic reviewed, DDIs with Xeloda identified: none ? ?Evaluated chart and no patient barriers to medication adherence noted.  ? ?Patient agreement for treatment documented in MD note on 03/06/2022. ? ?Prescription has been e-scribed to the [facility] for benefits analysis and approval. ? ?Oral Oncology Clinic will continue to follow for insurance authorization, copayment issues, initial counseling and start date. ? ?Drema Halon, PharmD ?Hematology/Oncology Clinical Pharmacist ?[facility] ?[phone] ?03/07/2022 7:55 AM ? ?

## 2022-03-08 ENCOUNTER — Encounter (HOSPITAL_COMMUNITY): Payer: Self-pay

## 2022-03-08 ENCOUNTER — Other Ambulatory Visit (HOSPITAL_COMMUNITY): Payer: Self-pay

## 2022-03-08 NOTE — Telephone Encounter (Signed)
Oral Chemotherapy Pharmacist Encounter ? ?I spoke with patient for overview of: Xeloda (capecitabine) for the  treatment of metastatic breast cancer, planned duration until disease progression or unacceptable drug toxicity. ? ?Counseled patient on administration, dosing, side effects, monitoring, drug-food interactions, safe handling, storage, and disposal. ? ?Patient will take Xeloda '500mg'$  tablets, 3 tablets ('1500mg'$ ) by mouth in AM and 3 tabs ('1500mg'$ ) by mouth in PM, within 30 minutes of finishing meals, for 14 days on, 7 days off, repeated every 21 days. ? ?Xeloda start date: 03/09/2022 ? ?Adverse effects include but are not limited to: fatigue, decreased blood counts, GI upset, diarrhea, mouth sores, and hand-foot syndrome. ? ?Patient has anti-emetic on hand and knows to take it if nausea develops.   ?Patient will obtain anti diarrheal and alert the office of 4 or more loose stools above baseline. ? ?Reviewed with patient importance of keeping a medication schedule and plan for any missed doses. No barriers to medication adherence identified. ? ?Medication reconciliation performed and medication/allergy list updated. ? ?Patient will pick up medication from [facility] on 03/08/2022.  ? ?Patient informed the pharmacy will reach out 5-7 days prior to needing next fill of Xeloda to coordinate continued medication acquisition to prevent break in therapy. ? ?All questions answered. ? ?[name] voiced understanding and appreciation.  ? ?Medication education handout placed in mail for patient. Patient knows to call the office with questions or concerns. Oral Chemotherapy Clinic phone number provided to patient.  ? ?Drema Halon, PharmD ?Hematology/Oncology Clinical Pharmacist ?[facility] ?[phone] ? ?

## 2022-03-09 DIAGNOSIS — C50919 Malignant neoplasm of unspecified site of unspecified female breast: Secondary | ICD-10-CM | POA: Insufficient documentation

## 2022-03-14 ENCOUNTER — Other Ambulatory Visit (HOSPITAL_COMMUNITY): Payer: Self-pay

## 2022-03-20 ENCOUNTER — Other Ambulatory Visit (HOSPITAL_COMMUNITY): Payer: Self-pay

## 2022-03-27 ENCOUNTER — Inpatient Hospital Stay: Payer: Medicaid Other | Admitting: Pharmacist

## 2022-03-27 ENCOUNTER — Inpatient Hospital Stay: Payer: Medicaid Other

## 2022-03-27 ENCOUNTER — Other Ambulatory Visit (HOSPITAL_COMMUNITY): Payer: Self-pay

## 2022-03-27 ENCOUNTER — Encounter: Payer: Self-pay | Admitting: Hematology and Oncology

## 2022-03-27 ENCOUNTER — Other Ambulatory Visit: Payer: Self-pay

## 2022-03-27 DIAGNOSIS — Z79818 Long term (current) use of other agents affecting estrogen receptors and estrogen levels: Secondary | ICD-10-CM | POA: Diagnosis not present

## 2022-03-27 DIAGNOSIS — C7951 Secondary malignant neoplasm of bone: Secondary | ICD-10-CM | POA: Diagnosis not present

## 2022-03-27 DIAGNOSIS — T451X5A Adverse effect of antineoplastic and immunosuppressive drugs, initial encounter: Secondary | ICD-10-CM | POA: Diagnosis not present

## 2022-03-27 DIAGNOSIS — C50911 Malignant neoplasm of unspecified site of right female breast: Secondary | ICD-10-CM

## 2022-03-27 DIAGNOSIS — C773 Secondary and unspecified malignant neoplasm of axilla and upper limb lymph nodes: Secondary | ICD-10-CM | POA: Diagnosis not present

## 2022-03-27 DIAGNOSIS — Z79899 Other long term (current) drug therapy: Secondary | ICD-10-CM | POA: Diagnosis not present

## 2022-03-27 DIAGNOSIS — G620 Drug-induced polyneuropathy: Secondary | ICD-10-CM | POA: Diagnosis not present

## 2022-03-27 DIAGNOSIS — C50411 Malignant neoplasm of upper-outer quadrant of right female breast: Secondary | ICD-10-CM

## 2022-03-27 DIAGNOSIS — Z170 Estrogen receptor positive status [ER+]: Secondary | ICD-10-CM | POA: Diagnosis not present

## 2022-03-27 DIAGNOSIS — Z5111 Encounter for antineoplastic chemotherapy: Secondary | ICD-10-CM | POA: Diagnosis not present

## 2022-03-27 DIAGNOSIS — C787 Secondary malignant neoplasm of liver and intrahepatic bile duct: Secondary | ICD-10-CM | POA: Diagnosis not present

## 2022-03-27 LAB — CBC WITH DIFFERENTIAL (CANCER CENTER ONLY)
Abs Immature Granulocytes: 0.01 10*3/uL (ref 0.00–0.07)
Basophils Absolute: 0 10*3/uL (ref 0.0–0.1)
Basophils Relative: 1 %
Eosinophils Absolute: 0.1 10*3/uL (ref 0.0–0.5)
Eosinophils Relative: 3 %
HCT: 32.7 % — ABNORMAL LOW (ref 36.0–46.0)
Hemoglobin: 11.4 g/dL — ABNORMAL LOW (ref 12.0–15.0)
Immature Granulocytes: 0 %
Lymphocytes Relative: 19 %
Lymphs Abs: 0.7 10*3/uL (ref 0.7–4.0)
MCH: 35.8 pg — ABNORMAL HIGH (ref 26.0–34.0)
MCHC: 34.9 g/dL (ref 30.0–36.0)
MCV: 102.8 fL — ABNORMAL HIGH (ref 80.0–100.0)
Monocytes Absolute: 0.4 10*3/uL (ref 0.1–1.0)
Monocytes Relative: 10 %
Neutro Abs: 2.5 10*3/uL (ref 1.7–7.7)
Neutrophils Relative %: 67 %
Platelet Count: 300 10*3/uL (ref 150–400)
RBC: 3.18 MIL/uL — ABNORMAL LOW (ref 3.87–5.11)
RDW: 14.7 % (ref 11.5–15.5)
WBC Count: 3.7 10*3/uL — ABNORMAL LOW (ref 4.0–10.5)
nRBC: 0 % (ref 0.0–0.2)

## 2022-03-27 LAB — CMP (CANCER CENTER ONLY)
ALT: 39 U/L (ref 0–44)
AST: 38 U/L (ref 15–41)
Albumin: 3.7 g/dL (ref 3.5–5.0)
Alkaline Phosphatase: 69 U/L (ref 38–126)
Anion gap: 7 (ref 5–15)
BUN: 16 mg/dL (ref 6–20)
CO2: 25 mmol/L (ref 22–32)
Calcium: 9.2 mg/dL (ref 8.9–10.3)
Chloride: 108 mmol/L (ref 98–111)
Creatinine: 0.88 mg/dL (ref 0.44–1.00)
GFR, Estimated: >60 mL/min (ref >60)
Glucose, Bld: 112 mg/dL — ABNORMAL HIGH (ref 70–99)
Potassium: 3.7 mmol/L (ref 3.5–5.1)
Sodium: 140 mmol/L (ref 135–145)
Total Bilirubin: 0.3 mg/dL (ref 0.3–1.2)
Total Protein: 6.5 g/dL (ref 6.5–8.1)

## 2022-03-27 NOTE — Progress Notes (Signed)
?[place]  ?     Telephone: [phone]?Fax: [phone]  ? ?Oncology Clinical Pharmacist Practitioner Initial Assessment ? ?April [name] is a 60 y.o. female with a diagnosis of breast cancer. They were contacted today via in-person visit. ? ?Indication/Regimen ?Capecitabine (Xeloda?) is being used appropriately for treatment of metastatic breast cancer by Dr. [name].  ?  ?  ?Wt Readings from Last 1 Encounters:  ?03/27/22 215 lb 1.6 oz (97.6 kg)  ? ? Estimated body surface area is 2.1 meters squared as calculated from the following: ?  Height as of this encounter: '5\' 4"'$  (1.626 m). ?  Weight as of this encounter: 215 lb 1.6 oz (97.6 kg). ? ?The dosing regimen is 3 tablets (1500 mg) by mouth in the morning and 3 tablets (1500 mg) in the evening on days 1 to 14 of a 21-day cycle. . This is being given  in combination with every 3 month denosumab 120 mg (started 10/13/21) . It is planned to continue until disease progression or unacceptable toxicity.  April [name] was seen today by clinical pharmacy to establish care after being referred by Dr. Lindi Adie for capecitabine.  She started this on 03/09/22 and is currently on her off week.  She is due to start cycle 2 on 03/30/22.  So far she is tolerating the capecitabine very well.  She does have ondansetron at home but has not needed it thus far.  We also discussed how to take loperamide over-the-counter as needed.  We reviewed possible side effects of capecitabine which include but are not limited to diarrhea, dehydration, nausea, vomiting, hand-foot syndrome, stomatitis, fever, neutropenia, alopecia, and fatigue.  April [name] reports using topical lotions for the prevention of hand-foot syndrome.  We also discussed trying to keep the hands moist, wearing loose fitting socks and shoes, wearing sunscreen if applicable, and considering wearing gloves when doing dishes.  She reports having a very good system in place on her phone that reminds her on  when to take her capecitabine and when she is on her off week.  We explained that we could produce a calendar if need be at a later time.  She does take famotidine for acid reflux as needed.  She does state that she has frequent "background" headaches that she takes naproxen for with relief.  She also has been taking simethicone as needed for gas pain that she states she has had for quite some time.  Her CA 27.29 has not resulted yet but Dr. Lindi Adie has been following.  She will tentatively see clinical pharmacy with labs in 3 weeks, and Dr. Lindi Adie with labs in 6 weeks. ? ?Dose Modifications ?Capecitabine 1500 mg (725 mg/m2) BID 14/21  ? ?Access Assessment ?April [name] will be receiving capecitabine through [facility] ?Insurance Concerns: no ?Start date if known: 03/09/22 ? ?Allergies ?Allergies  ?Allergen Reactions  ? Cymbalta [Duloxetine Hcl] Other (See Comments)  ?  Causes sores on arm  ? Hydrocodone Nausea Only and Other (See Comments)  ?  dizziness  ? Ultram [Tramadol Hcl] Nausea Only  ? Venlafaxine Other (See Comments)  ?  Causes sores on arm  ? Gabapentin Rash  ? ? ?Vitals ? ?  03/27/2022  ?  1:30 PM 03/06/2022  ?  2:28 PM 02/01/2022  ?  2:20 PM  ?Vitals with BMI  ?Height '5\' 4"'$  '5\' 4"'$  '5\' 4"'$   ?Weight 215 lbs 2 oz 209 lbs 11 oz 217 lbs 2 oz  ?  BMI 36.9 35.98 37.25  ?Systolic 161 096 045  ?Diastolic 72 93 87  ?Pulse 86 95 81  ?  ? ?Laboratory Data ? ?  Latest Ref Rng & Units 03/27/2022  ?  1:15 PM 03/06/2022  ?  2:16 PM 02/01/2022  ?  1:54 PM  ?CBC EXTENDED  ?WBC 4.0 - 10.5 K/uL 3.7   3.3   3.0    ?RBC 3.87 - 5.11 MIL/uL 3.18   3.57   3.24    ?Hemoglobin 12.0 - 15.0 g/dL 11.4   12.7   11.5    ?HCT 36.0 - 46.0 % 32.7   36.7   33.0    ?Platelets 150 - 400 K/uL 300   230   179    ?NEUT# 1.7 - 7.7 K/uL 2.5   2.4   1.9    ?Lymph# 0.7 - 4.0 K/uL 0.7   0.7   0.8    ?  ? ?  Latest Ref Rng & Units 03/06/2022  ?  2:16 PM 02/01/2022  ?  1:54 PM 01/04/2022  ?  9:49 AM  ?CMP  ?Glucose 70 - 99 mg/dL 100   93   91    ?BUN 6  - 20 mg/dL '13   14   8    '$ ?Creatinine 0.44 - 1.00 mg/dL 1.16   1.00   1.14    ?Sodium 135 - 145 mmol/L 141   140   139    ?Potassium 3.5 - 5.1 mmol/L 4.1   4.0   4.0    ?Chloride 98 - 111 mmol/L 108   109   108    ?CO2 22 - 32 mmol/L '26   25   25    '$ ?Calcium 8.9 - 10.3 mg/dL 9.6   8.7   9.5    ?Total Protein 6.5 - 8.1 g/dL 7.3   6.7   7.1    ?Total Bilirubin 0.3 - 1.2 mg/dL 0.3   <0.3   0.4    ?Alkaline Phos 38 - 126 U/L 61   63   65    ?AST 15 - 41 U/L '18   24   15    '$ ?ALT 0 - 44 U/L '13   21   9    '$ ? ?Lab Results  ?Component Value Date  ? MG 1.8 08/07/2016  ?  ? ?Contraindications ?Contraindications were reviewed?  Yes ?Contraindications to therapy were identified?  No ? ?Safety Precautions ?The following safety precautions for the use of capecitabine were reviewed:  ?Fever: reviewed the importance of having a thermometer and the Centers for Disease Control and Prevention (CDC) definition of fever which is 100.4?F (38?C) or higher. Patient should call 24/7 triage at [phone] if experiencing a fever or any other symptoms ?Diarrhea ?Cardiotoxicity ?Dehydration and renal failure ?Nausea ?Vomiting ?Palmar-plantar erythrodysesthesia (Hand-Foot Syndrome) ?Stomatitis ?Neutropenia ?Alopecia ?Fatigue ?Storage and Handling ?To be taken within 30 minutes of a meal ?Missed doses ? ?Medication Reconciliation ?Current Outpatient Medications  ?Medication Sig Dispense Refill  ? capecitabine (XELODA) 500 MG tablet Take 3 tablets (1,500 mg total) by mouth 2 (two) times daily after a meal. 14 days on and 7 days off 84 tablet 3  ? cetirizine (ZYRTEC) 10 MG tablet Take 10 mg by mouth daily.     ? Denosumab (XGEVA [place]) Inject 120 mg into the skin every 30 (thirty) days.    ? famotidine (PEPCID) 20 MG tablet Take 20 mg by mouth daily.    ? Fulvestrant (  FASLODEX IM) Inject 500 mg into the muscle every 30 (thirty) days. Starting Monthly with next dose. Injection given at [facility]    ? naproxen sodium (ALEVE) 220 MG tablet Take 660 mg by  mouth 3 (three) times daily as needed (pain).    ? ondansetron (ZOFRAN) 8 MG tablet Take 1 tablet (8 mg total) by mouth every 8 (eight) hours as needed for nausea or vomiting. 30 tablet 1  ? valACYclovir (VALTREX) 1000 MG tablet Take 1 tablet (1,000 mg total) by mouth 2 (two) times daily. 1/2 tab BID 180 tablet 4  ? ?No current facility-administered medications for this visit.  ? ? ?Medication reconciliation is based on the patient's most recent medication list in the electronic medical record (EMR) including herbal products and OTC medications.  ? ?The patient's medication list was reviewed today with the patient?  Yes ? ?Drug-drug interactions (DDIs) ?DDIs were evaluated?  Yes ?Significant DDIs identified?  No ? ?Drug-Food Interactions ?Drug-food interactions were evaluated?  Yes ?Drug-food interactions identified?  No ? ?Follow-up Plan  ?Continue capecitabine 1500 mg every 12 hours 14 days on, followed by a 7-day rest period.  Cycle 2 starts 03/30/22 ?Labs, pharmacy clinic visit, in 3 weeks ?Labs, Dr. Lindi Adie visit, in 6 weeks ?Continue to monitor bra line back pain.  ?Continue to trend CA 27.29 ? ?April [name] participated in the discussion, expressed understanding, and voiced agreement with the above plan. All questions were answered to her satisfaction. The patient was advised to contact the clinic at [phone] with any questions or concerns prior to her return visit.  ? ?I spent 60 minutes assessing the patient. ? ?Raina Mina, RPH-CPP, 03/27/2022 1:43 PM ? ?**Disclaimer: This note was dictated with voice recognition software. Similar sounding words can inadvertently be transcribed and this note may contain transcription errors which may not have been corrected upon publication of note.** ? ?

## 2022-03-28 ENCOUNTER — Telehealth: Payer: Self-pay | Admitting: Pharmacist

## 2022-03-28 LAB — CANCER ANTIGEN 27.29: CA 27.29: 288 U/mL — ABNORMAL HIGH (ref 0.0–38.6)

## 2022-03-28 NOTE — Telephone Encounter (Signed)
Scheduled appointment per 04/24 los. Patient aware.  ?

## 2022-04-10 ENCOUNTER — Other Ambulatory Visit (HOSPITAL_COMMUNITY): Payer: Self-pay

## 2022-04-17 ENCOUNTER — Inpatient Hospital Stay: Payer: Medicaid Other | Attending: Oncology | Admitting: Pharmacist

## 2022-04-17 ENCOUNTER — Other Ambulatory Visit (HOSPITAL_COMMUNITY): Payer: Self-pay

## 2022-04-17 ENCOUNTER — Inpatient Hospital Stay: Payer: Medicaid Other

## 2022-04-17 ENCOUNTER — Other Ambulatory Visit: Payer: Self-pay

## 2022-04-17 VITALS — BP 121/87 | HR 72 | Temp 98.1°F | Resp 16 | Ht 64.0 in | Wt 213.6 lb

## 2022-04-17 DIAGNOSIS — C773 Secondary and unspecified malignant neoplasm of axilla and upper limb lymph nodes: Secondary | ICD-10-CM | POA: Diagnosis not present

## 2022-04-17 DIAGNOSIS — Z170 Estrogen receptor positive status [ER+]: Secondary | ICD-10-CM | POA: Diagnosis not present

## 2022-04-17 DIAGNOSIS — C787 Secondary malignant neoplasm of liver and intrahepatic bile duct: Secondary | ICD-10-CM | POA: Diagnosis not present

## 2022-04-17 DIAGNOSIS — C50411 Malignant neoplasm of upper-outer quadrant of right female breast: Secondary | ICD-10-CM | POA: Diagnosis not present

## 2022-04-17 DIAGNOSIS — C7951 Secondary malignant neoplasm of bone: Secondary | ICD-10-CM | POA: Diagnosis not present

## 2022-04-17 DIAGNOSIS — C50911 Malignant neoplasm of unspecified site of right female breast: Secondary | ICD-10-CM

## 2022-04-17 LAB — CMP (CANCER CENTER ONLY)
ALT: 19 U/L (ref 0–44)
AST: 22 U/L (ref 15–41)
Albumin: 3.7 g/dL (ref 3.5–5.0)
Alkaline Phosphatase: 83 U/L (ref 38–126)
Anion gap: 5 (ref 5–15)
BUN: 16 mg/dL (ref 6–20)
CO2: 26 mmol/L (ref 22–32)
Calcium: 9.1 mg/dL (ref 8.9–10.3)
Chloride: 108 mmol/L (ref 98–111)
Creatinine: 0.94 mg/dL (ref 0.44–1.00)
GFR, Estimated: >60 mL/min (ref >60)
Glucose, Bld: 107 mg/dL — ABNORMAL HIGH (ref 70–99)
Potassium: 3.8 mmol/L (ref 3.5–5.1)
Sodium: 139 mmol/L (ref 135–145)
Total Bilirubin: 0.4 mg/dL (ref 0.3–1.2)
Total Protein: 6.3 g/dL — ABNORMAL LOW (ref 6.5–8.1)

## 2022-04-17 LAB — CBC WITH DIFFERENTIAL (CANCER CENTER ONLY)
Abs Immature Granulocytes: 0.01 10*3/uL (ref 0.00–0.07)
Basophils Absolute: 0 10*3/uL (ref 0.0–0.1)
Basophils Relative: 0 %
Eosinophils Absolute: 0.2 10*3/uL (ref 0.0–0.5)
Eosinophils Relative: 5 %
HCT: 33.2 % — ABNORMAL LOW (ref 36.0–46.0)
Hemoglobin: 11.7 g/dL — ABNORMAL LOW (ref 12.0–15.0)
Immature Granulocytes: 0 %
Lymphocytes Relative: 19 %
Lymphs Abs: 0.9 10*3/uL (ref 0.7–4.0)
MCH: 36.7 pg — ABNORMAL HIGH (ref 26.0–34.0)
MCHC: 35.2 g/dL (ref 30.0–36.0)
MCV: 104.1 fL — ABNORMAL HIGH (ref 80.0–100.0)
Monocytes Absolute: 0.4 10*3/uL (ref 0.1–1.0)
Monocytes Relative: 9 %
Neutro Abs: 3.2 10*3/uL (ref 1.7–7.7)
Neutrophils Relative %: 67 %
Platelet Count: 212 10*3/uL (ref 150–400)
RBC: 3.19 MIL/uL — ABNORMAL LOW (ref 3.87–5.11)
RDW: 15.3 % (ref 11.5–15.5)
WBC Count: 4.7 10*3/uL (ref 4.0–10.5)
nRBC: 0 % (ref 0.0–0.2)

## 2022-04-17 MED ORDER — PROCHLORPERAZINE MALEATE 10 MG PO TABS: 10.0000 mg | ORAL_TABLET | Freq: Four times a day (QID) | ORAL | 2 refills | Status: DC | PRN

## 2022-04-17 NOTE — Progress Notes (Signed)
?[place]  ?     Telephone: [phone]?Fax: [phone]  ? ?Oncology Clinical Pharmacist Practitioner Progress Note ? ?April [name] was contacted via in-person visit to discuss her chemotherapy regimen for capecitabine which they receive under the care of Dr. [name].  ? ?Current treatment regimen and start date ?Capecitabine (03/09/22) ?Denosumab 120 mg every 12 weeks (10/13/21) ? ?Interval History ?She continues on capecitabine 3 tablets (1500 mg) in the morning and 3 tablets (1500 mg) in the evening on days 1 to 14 of a 21-day cycle. This is being given in combination with denosumab 120 mg every 12 weeks. Therapy is planned to continue until disease progression or unacceptable toxicity.  ? ?Response to Therapy ?[name] is doing well.  She was last seen by clinical pharmacy on 03/27/22 and Dr. Lindi Adie on 03/06/2022.  She does report that for cycle 2 of capecitabine she did not take her dose on time which would have been 03/30/22.  She started her cycle on 03/31/22 but on that date she only took 1 tablet in the morning and 1 tablet in the evening.  She states she simply forgot.  She does have alarms and a calendar on her phone.  Today we also gave her a hard copy of a calendar that she can use for her next cycle which will start on 04/22/22.  She normally takes her medication at 8:30 AM and 8:30 PM.  She states that she has had some nausea which is relieved by ondansetron.  She does feel that the nausea is pretty consistent and so we discussed today taking ondansetron about 1 hour prior to when she normally feels the nausea is going to start.  We also sent her a prescription for prochlorperazine which she will take as needed.  We did review that the prochlorperazine may make her sleepy.  She continues to have some acid reflux which she takes famotidine for with relief.  She states the back pain near her bra line is stable and is not getting any worse.  She states that she has had this pain  for a long time and imaging to that area has never shown any cause for concern per her report.  Her lymphedema in her right arm continues to be stable.  She states that she does have exercises and sleeves that she uses at home when needed. ? ?We did discuss today that she could consider going up on the capecitabine dose if she continues to tolerate it well.  She will discuss this with Thedore Mins NP, Dr. Lindi Adie, or myself at her upcoming visits.  We recommended that if she does increase the dose, that she should do it slowly.  We recommended going up by 500 mg either in the evening or in the morning but that she do this when starting a new cycle of capecitabine.  She does not report any signs of palmar-plantar erythrodysesthesia and continues to use lotion appropriately.  She does report 1 small sore on her tongue and a few others that have gone away since her last visit.  She states that these have not been anywhere near as bad as when she was on palbociclib.  We again reviewed today that practicing good dental hygiene, avoiding alcohol mouthwashes, and using salt water rinses may help.  She will notify the clinic should any symptoms arise or get any worse. Labs, vitals, treatment parameters, and manufacturer guidelines assessing toxicity were reviewed with Zenovia Jordan today. Based  on these values, patient is in agreement to continue therapy at this time. ? ?Allergies ?Allergies  ?Allergen Reactions  ? Cymbalta [Duloxetine Hcl] Other (See Comments)  ?  Causes sores on arm  ? Hydrocodone Nausea Only and Other (See Comments)  ?  dizziness  ? Ultram [Tramadol Hcl] Nausea Only  ? Venlafaxine Other (See Comments)  ?  Causes sores on arm  ? Gabapentin Rash  ? ? ?Vitals ? ?  04/17/2022  ?  2:45 PM 03/27/2022  ?  1:30 PM 03/06/2022  ?  2:28 PM  ?Vitals with BMI  ?Height '5\' 4"'$  '5\' 4"'$  '5\' 4"'$   ?Weight 213 lbs 10 oz 215 lbs 2 oz 209 lbs 11 oz  ?BMI 36.65 36.9 35.98  ?Systolic 147 829 562  ?Diastolic 87 72 93  ?Pulse 72 86 95   ?  ? ?Laboratory Data ? ?  Latest Ref Rng & Units 04/17/2022  ?  2:29 PM 03/27/2022  ?  1:15 PM 03/06/2022  ?  2:16 PM  ?CBC EXTENDED  ?WBC 4.0 - 10.5 K/uL 4.7   3.7   3.3    ?RBC 3.87 - 5.11 MIL/uL 3.19   3.18   3.57    ?Hemoglobin 12.0 - 15.0 g/dL 11.7   11.4   12.7    ?HCT 36.0 - 46.0 % 33.2   32.7   36.7    ?Platelets 150 - 400 K/uL 212   300   230    ?NEUT# 1.7 - 7.7 K/uL 3.2   2.5   2.4    ?Lymph# 0.7 - 4.0 K/uL 0.9   0.7   0.7    ?  ? ?  Latest Ref Rng & Units 04/17/2022  ?  2:29 PM 03/27/2022  ?  1:15 PM 03/06/2022  ?  2:16 PM  ?CMP  ?Glucose 70 - 99 mg/dL 107   112   100    ?BUN 6 - 20 mg/dL '16   16   13    '$ ?Creatinine 0.44 - 1.00 mg/dL 0.94   0.88   1.16    ?Sodium 135 - 145 mmol/L 139   140   141    ?Potassium 3.5 - 5.1 mmol/L 3.8   3.7   4.1    ?Chloride 98 - 111 mmol/L 108   108   108    ?CO2 22 - 32 mmol/L '26   25   26    '$ ?Calcium 8.9 - 10.3 mg/dL 9.1   9.2   9.6    ?Total Protein 6.5 - 8.1 g/dL 6.3   6.5   7.3    ?Total Bilirubin 0.3 - 1.2 mg/dL 0.4   0.3   0.3    ?Alkaline Phos 38 - 126 U/L 83   69   61    ?AST 15 - 41 U/L 22   38   18    ?ALT 0 - 44 U/L 19   39   13    ?  ?Lab Results  ?Component Value Date  ? MG 1.8 08/07/2016  ?  ? ?Adverse Effects Assessment ?Nausea: Using ondansetron.  Recommended scheduling ondansetron once daily, 60 minutes prior to nausea onset since it appears that her nausea does occur at the same time every day.  We also sent prochlorperazine as needed to her pharmacy of choice. ?Acid reflux: Using famotidine with resolution. ? ?Adherence Assessment ?April [name] reports missing 2 doses over the past 3 weeks.   ?Reason for missed  dose: As above, got confused on when she should start her last cycle and how many tablets she should take.  She is using phone alarms and her calendar.  We also provided a hardcopy of a calendar today for her next upcoming cycle which will start on 04/22/22 ?Patient was re-educated on importance of adherence.  ? ?Access Assessment ?NAUTIKA CRESSEY is  currently receiving her capecitabine through [facility] ?Insurance concerns: None ? ?Medication Reconciliation ?The patient's medication list was reviewed today with the patient?  Yes ?New medications or herbal supplements have recently been started?  No, prochlorperazine sent and will start if needed. ?Any medications have been discontinued?  No ?The medication list was updated and reconciled based on the patient's most recent medication list in the electronic medical record (EMR) including herbal products and OTC medications.  ? ?Medications ?Current Outpatient Medications  ?Medication Sig Dispense Refill  ? prochlorperazine (COMPAZINE) 10 MG tablet Take 1 tablet (10 mg total) by mouth every 6 (six) hours as needed for nausea or vomiting. 30 tablet 2  ? capecitabine (XELODA) 500 MG tablet Take 3 tablets (1,500 mg total) by mouth 2 (two) times daily after a meal. 14 days on and 7 days off 84 tablet 3  ? cetirizine (ZYRTEC) 10 MG tablet Take 10 mg by mouth daily.  (Patient not taking: Reported on 03/27/2022)    ? Denosumab (XGEVA [place]) Inject 120 mg into the skin every 3 (three) months.    ? famotidine (PEPCID) 20 MG tablet Take 20 mg by mouth daily.    ? naproxen sodium (ALEVE) 220 MG tablet Take 660 mg by mouth 3 (three) times daily as needed (pain).    ? ondansetron (ZOFRAN) 8 MG tablet Take 1 tablet (8 mg total) by mouth every 8 (eight) hours as needed for nausea or vomiting. (Patient not taking: Reported on 03/27/2022) 30 tablet 1  ? SIMETHICONE EXTRA STRENGTH PO Take by mouth as needed.    ? valACYclovir (VALTREX) 1000 MG tablet Take 1 tablet (1,000 mg total) by mouth 2 (two) times daily. 1/2 tab BID (Patient taking differently: Take 1,000 mg by mouth 2 (two) times daily. 1/2 tab BID. Takes 500 mg BID) 180 tablet 4  ? ?No current facility-administered medications for this visit.  ? ? ?Drug-Drug Interactions (DDIs) ?DDIs were evaluated?  Yes ?Significant DDIs?  No ?The patient was instructed to  speak with their health care provider and/or the oral chemotherapy pharmacist before starting any new drug, including prescription or over the counter, natural / herbal products, or vitamins. ? ?Supporti

## 2022-04-18 LAB — CANCER ANTIGEN 27.29: CA 27.29: 338.9 U/mL — ABNORMAL HIGH (ref 0.0–38.6)

## 2022-05-04 ENCOUNTER — Other Ambulatory Visit (HOSPITAL_COMMUNITY): Payer: Self-pay

## 2022-05-08 ENCOUNTER — Inpatient Hospital Stay: Payer: Medicaid Other | Attending: Oncology

## 2022-05-08 ENCOUNTER — Encounter: Payer: Self-pay | Admitting: Adult Health

## 2022-05-08 ENCOUNTER — Inpatient Hospital Stay (HOSPITAL_BASED_OUTPATIENT_CLINIC_OR_DEPARTMENT_OTHER): Payer: Medicaid Other | Admitting: Adult Health

## 2022-05-08 ENCOUNTER — Other Ambulatory Visit: Payer: Self-pay

## 2022-05-08 DIAGNOSIS — Z170 Estrogen receptor positive status [ER+]: Secondary | ICD-10-CM

## 2022-05-08 DIAGNOSIS — C773 Secondary and unspecified malignant neoplasm of axilla and upper limb lymph nodes: Secondary | ICD-10-CM | POA: Diagnosis not present

## 2022-05-08 DIAGNOSIS — M810 Age-related osteoporosis without current pathological fracture: Secondary | ICD-10-CM | POA: Insufficient documentation

## 2022-05-08 DIAGNOSIS — C50411 Malignant neoplasm of upper-outer quadrant of right female breast: Secondary | ICD-10-CM | POA: Diagnosis not present

## 2022-05-08 LAB — CMP (CANCER CENTER ONLY)
ALT: 25 U/L (ref 0–44)
AST: 29 U/L (ref 15–41)
Albumin: 3.8 g/dL (ref 3.5–5.0)
Alkaline Phosphatase: 92 U/L (ref 38–126)
Anion gap: 5 (ref 5–15)
BUN: 16 mg/dL (ref 6–20)
CO2: 24 mmol/L (ref 22–32)
Calcium: 9 mg/dL (ref 8.9–10.3)
Chloride: 110 mmol/L (ref 98–111)
Creatinine: 0.98 mg/dL (ref 0.44–1.00)
GFR, Estimated: >60 mL/min (ref >60)
Glucose, Bld: 104 mg/dL — ABNORMAL HIGH (ref 70–99)
Potassium: 3.9 mmol/L (ref 3.5–5.1)
Sodium: 139 mmol/L (ref 135–145)
Total Bilirubin: 0.4 mg/dL (ref 0.3–1.2)
Total Protein: 6.3 g/dL — ABNORMAL LOW (ref 6.5–8.1)

## 2022-05-08 LAB — CBC WITH DIFFERENTIAL (CANCER CENTER ONLY)
Abs Immature Granulocytes: 0 10*3/uL (ref 0.00–0.07)
Basophils Absolute: 0 10*3/uL (ref 0.0–0.1)
Basophils Relative: 1 %
Eosinophils Absolute: 0.1 10*3/uL (ref 0.0–0.5)
Eosinophils Relative: 3 %
HCT: 33.1 % — ABNORMAL LOW (ref 36.0–46.0)
Hemoglobin: 11.6 g/dL — ABNORMAL LOW (ref 12.0–15.0)
Immature Granulocytes: 0 %
Lymphocytes Relative: 21 %
Lymphs Abs: 0.9 10*3/uL (ref 0.7–4.0)
MCH: 37.3 pg — ABNORMAL HIGH (ref 26.0–34.0)
MCHC: 35 g/dL (ref 30.0–36.0)
MCV: 106.4 fL — ABNORMAL HIGH (ref 80.0–100.0)
Monocytes Absolute: 0.4 10*3/uL (ref 0.1–1.0)
Monocytes Relative: 10 %
Neutro Abs: 2.7 10*3/uL (ref 1.7–7.7)
Neutrophils Relative %: 65 %
Platelet Count: 230 10*3/uL (ref 150–400)
RBC: 3.11 MIL/uL — ABNORMAL LOW (ref 3.87–5.11)
RDW: 16.2 % — ABNORMAL HIGH (ref 11.5–15.5)
WBC Count: 4.1 10*3/uL (ref 4.0–10.5)
nRBC: 0 % (ref 0.0–0.2)

## 2022-05-08 MED ORDER — RIZATRIPTAN BENZOATE 5 MG PO TABS: 5.0000 mg | ORAL_TABLET | ORAL | 0 refills | Status: DC | PRN
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
## 2022-05-08 NOTE — Assessment & Plan Note (Addendum)
  CT CAP 02/01/2022: Mild progression of disease with increased number and size of the numerous lung nodules in the right lung.  Persistent lymphadenopathy and right hilar and right paratracheal nodal areas, numerous hypovascular hepatic lesions slightly larger when compared to previous.  Previously HER2 1+ by IHC: Patient candidate for Enhertu. We also discussed that potential future options include Sacituzumab-Govitecan as well as other chemotherapy options.  Current treatment: Xeloda, April [name] is doing well today.  She is tolerating Xeloda pretty well.  She does have some mild fatigue.  We discussed potentially getting scans a little bit early since her tumor markers continue to rise.  She wants to see what her CA 27-29 is with this next lab draw and will let us know after if she would like to proceed with imaging.  We discussed healthy diet and exercise, and her medication list in detail.  I reviewed the above with Dr. Lindi Adie in detail who is in agreement with the plan.  She has f/u with Gilford Rile in 3 weeks.

## 2022-05-09 ENCOUNTER — Telehealth: Payer: Self-pay

## 2022-05-09 ENCOUNTER — Other Ambulatory Visit (HOSPITAL_COMMUNITY): Payer: Self-pay

## 2022-05-09 LAB — CANCER ANTIGEN 27.29: CA 27.29: 409 U/mL — ABNORMAL HIGH (ref 0.0–38.6)

## 2022-05-09 NOTE — Telephone Encounter (Signed)
Pt scheduled for CT C/A/P 6/23 at 0930. Pt knows to be NPO for 4 hrs prior to appt, consume PO contrast at 0730 & 0830 and arrive at 0915 for check in. She knows to call with any further questions/concerns.

## 2022-05-10 ENCOUNTER — Other Ambulatory Visit (HOSPITAL_COMMUNITY): Payer: Self-pay

## 2022-05-15 ENCOUNTER — Encounter: Payer: Self-pay | Admitting: Hematology and Oncology

## 2022-05-17 ENCOUNTER — Other Ambulatory Visit: Payer: Self-pay

## 2022-05-17 MED ORDER — RIZATRIPTAN BENZOATE 5 MG PO TABS
5.0000 mg | ORAL_TABLET | ORAL | 0 refills | Status: DC | PRN
Start: 2022-05-17 — End: 2023-01-08
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
## 2022-05-26 ENCOUNTER — Encounter (HOSPITAL_COMMUNITY): Payer: Self-pay

## 2022-05-26 ENCOUNTER — Ambulatory Visit (HOSPITAL_COMMUNITY)
Admission: RE | Admit: 2022-05-26 | Discharge: 2022-05-26 | Disposition: A | Discharge status: Home / Self Care | Payer: Medicaid Other | Source: Ambulatory Visit | Attending: Hematology and Oncology | Admitting: Hematology and Oncology

## 2022-05-26 DIAGNOSIS — K7689 Other specified diseases of liver: Secondary | ICD-10-CM | POA: Diagnosis not present

## 2022-05-26 DIAGNOSIS — J90 Pleural effusion, not elsewhere classified: Secondary | ICD-10-CM | POA: Diagnosis not present

## 2022-05-26 DIAGNOSIS — R590 Localized enlarged lymph nodes: Secondary | ICD-10-CM | POA: Diagnosis not present

## 2022-05-26 DIAGNOSIS — C787 Secondary malignant neoplasm of liver and intrahepatic bile duct: Secondary | ICD-10-CM | POA: Diagnosis not present

## 2022-05-26 DIAGNOSIS — C50911 Malignant neoplasm of unspecified site of right female breast: Secondary | ICD-10-CM | POA: Diagnosis not present

## 2022-05-26 DIAGNOSIS — K449 Diaphragmatic hernia without obstruction or gangrene: Secondary | ICD-10-CM | POA: Diagnosis not present

## 2022-05-26 DIAGNOSIS — J941 Fibrothorax: Secondary | ICD-10-CM | POA: Diagnosis not present

## 2022-05-26 DIAGNOSIS — J8410 Pulmonary fibrosis, unspecified: Secondary | ICD-10-CM | POA: Diagnosis not present

## 2022-05-26 MED ORDER — IOHEXOL 300 MG/ML  SOLN
100.0000 mL | Freq: Once | INTRAMUSCULAR | Status: AC | PRN
  Administered 2022-05-26: 100 mL via INTRAVENOUS

## 2022-05-26 MED ORDER — SODIUM CHLORIDE (PF) 0.9 % IJ SOLN
INTRAMUSCULAR | Status: AC
  Filled 2022-05-26: qty 50

## 2022-05-29 ENCOUNTER — Other Ambulatory Visit: Payer: Self-pay

## 2022-05-29 ENCOUNTER — Other Ambulatory Visit: Payer: Self-pay | Admitting: *Deleted

## 2022-05-29 DIAGNOSIS — C50411 Malignant neoplasm of upper-outer quadrant of right female breast: Secondary | ICD-10-CM

## 2022-05-30 ENCOUNTER — Inpatient Hospital Stay: Payer: Medicaid Other | Admitting: Pharmacist

## 2022-05-30 ENCOUNTER — Inpatient Hospital Stay: Payer: Medicaid Other

## 2022-05-30 ENCOUNTER — Other Ambulatory Visit: Payer: Self-pay

## 2022-05-30 ENCOUNTER — Other Ambulatory Visit (HOSPITAL_COMMUNITY): Payer: Self-pay

## 2022-05-30 VITALS — BP 113/81 | HR 80 | Temp 97.7°F | Resp 18 | Ht 64.0 in | Wt 209.1 lb

## 2022-05-30 DIAGNOSIS — C50411 Malignant neoplasm of upper-outer quadrant of right female breast: Secondary | ICD-10-CM | POA: Diagnosis not present

## 2022-05-30 DIAGNOSIS — M810 Age-related osteoporosis without current pathological fracture: Secondary | ICD-10-CM | POA: Diagnosis not present

## 2022-05-30 DIAGNOSIS — Z170 Estrogen receptor positive status [ER+]: Secondary | ICD-10-CM | POA: Diagnosis not present

## 2022-05-30 DIAGNOSIS — C773 Secondary and unspecified malignant neoplasm of axilla and upper limb lymph nodes: Secondary | ICD-10-CM | POA: Diagnosis not present

## 2022-05-30 DIAGNOSIS — C50911 Malignant neoplasm of unspecified site of right female breast: Secondary | ICD-10-CM

## 2022-05-30 LAB — CBC WITH DIFFERENTIAL (CANCER CENTER ONLY)
Abs Immature Granulocytes: 0.01 10*3/uL (ref 0.00–0.07)
Basophils Absolute: 0 10*3/uL (ref 0.0–0.1)
Basophils Relative: 0 %
Eosinophils Absolute: 0.1 10*3/uL (ref 0.0–0.5)
Eosinophils Relative: 2 %
HCT: 35.2 % — ABNORMAL LOW (ref 36.0–46.0)
Hemoglobin: 12.5 g/dL (ref 12.0–15.0)
Immature Granulocytes: 0 %
Lymphocytes Relative: 21 %
Lymphs Abs: 0.8 10*3/uL (ref 0.7–4.0)
MCH: 38.6 pg — ABNORMAL HIGH (ref 26.0–34.0)
MCHC: 35.5 g/dL (ref 30.0–36.0)
MCV: 108.6 fL — ABNORMAL HIGH (ref 80.0–100.0)
Monocytes Absolute: 0.4 10*3/uL (ref 0.1–1.0)
Monocytes Relative: 10 %
Neutro Abs: 2.6 10*3/uL (ref 1.7–7.7)
Neutrophils Relative %: 67 %
Platelet Count: 232 10*3/uL (ref 150–400)
RBC: 3.24 MIL/uL — ABNORMAL LOW (ref 3.87–5.11)
RDW: 16.4 % — ABNORMAL HIGH (ref 11.5–15.5)
WBC Count: 3.8 10*3/uL — ABNORMAL LOW (ref 4.0–10.5)
nRBC: 0 % (ref 0.0–0.2)

## 2022-05-30 LAB — CMP (CANCER CENTER ONLY)
ALT: 38 U/L (ref 0–44)
AST: 46 U/L — ABNORMAL HIGH (ref 15–41)
Albumin: 4 g/dL (ref 3.5–5.0)
Alkaline Phosphatase: 74 U/L (ref 38–126)
Anion gap: 5 (ref 5–15)
BUN: 15 mg/dL (ref 6–20)
CO2: 25 mmol/L (ref 22–32)
Calcium: 9.2 mg/dL (ref 8.9–10.3)
Chloride: 106 mmol/L (ref 98–111)
Creatinine: 0.93 mg/dL (ref 0.44–1.00)
GFR, Estimated: >60 mL/min (ref >60)
Glucose, Bld: 100 mg/dL — ABNORMAL HIGH (ref 70–99)
Potassium: 4.3 mmol/L (ref 3.5–5.1)
Sodium: 136 mmol/L (ref 135–145)
Total Bilirubin: 0.5 mg/dL (ref 0.3–1.2)
Total Protein: 6.7 g/dL (ref 6.5–8.1)

## 2022-05-30 MED ORDER — PROCHLORPERAZINE MALEATE 10 MG PO TABS
10.0000 mg | ORAL_TABLET | Freq: Four times a day (QID) | ORAL | 2 refills | Status: DC | PRN
Start: 2022-05-30 — End: 2022-06-14

## 2022-05-30 MED ORDER — DENOSUMAB 120 MG/1.7ML ~~LOC~~ SOLN
120.0000 mg | Freq: Once | SUBCUTANEOUS | Status: AC
  Administered 2022-05-30: 120 mg via SUBCUTANEOUS
  Filled 2022-05-30: qty 1.7

## 2022-05-31 LAB — CANCER ANTIGEN 27.29: CA 27.29: 769.5 U/mL — ABNORMAL HIGH (ref 0.0–38.6)

## 2022-06-02 ENCOUNTER — Other Ambulatory Visit (HOSPITAL_COMMUNITY): Payer: Self-pay

## 2022-06-07 ENCOUNTER — Telehealth: Payer: Self-pay | Admitting: *Deleted

## 2022-06-07 NOTE — Telephone Encounter (Signed)
Received call from pt stating she has been contacted by DSS and was informed her Medicaid insurance will be terminated.  Pt requesting to speak with BCC[employer] coordinator to help re-enroll with Medicaid and BCCCP.  Information provided to pt and RN placed inbasket message as well to f/u.

## 2022-06-08 ENCOUNTER — Telehealth: Payer: Self-pay

## 2022-06-08 NOTE — Telephone Encounter (Signed)
-----   Message from Loletta Parish, RN sent at 06/08/2022 10:57 AM EDT ----- Regarding: RE: medicaid Unfortunately, if she has Medicare we are unable to help her, even if it is only Part A. She would not be eligible for [facility] Medicaid. She will need to follow up with DSS.  Thanks, Christine ----- Message ----- From: Lynnae Sandhoff, LPN Sent: 03/10/8294   9:37 AM EDT To: Merril Abbe, LPN; Jesse Fall, RN; # Subject: RE: Esther Hardy Morning,  Altha Harm, I spoke with Langley Gauss this morning and she said BCC[employer] told her she should have never been approved because she has Medicare part A. When she asked if she could d/c part A they informed her she would lose her disability check if she d/c'd part A. She is interested in signing up for adult medicaid and medicare part B with the hopes that medicaid will pick up her medicare premium. Is this something you can help assist her with?  Thanks, Mickel Baas, LPN ----- Message ----- From: Loletta Parish, RN Sent: 06/08/2022   9:04 AM EDT To: Merril Abbe, LPN; Jesse Fall, RN; # Subject: RE: medicaid                                   Good morning,  I have copied [name] to find out the status. She completes our [facility] applications and renewals.  Thanks, Christine ----- Message ----- From: Clayton Bibles Sent: 06/07/2022   3:35 PM EDT To: Merril Abbe, LPN; Jesse Fall, RN; # Subject: RE: medicaid                                   This is through [place] & her team, so I will let them handle it.  Altha Harm- let me know if you need anything from my end.   Sharyn Lull  ----- Message ----- From: Myrtie Hawk, RN Sent: 06/07/2022   3:21 PM EDT To: Merril Abbe, LPN; Jesse Fall, RN; # Subject: medicaid                                       Hi friends.  I received call from pt stating she has been contacted by DSS and was informed her Medicaid insurance will be  terminated.  She stated in the past she was enrolled with BCCCP and it was automatically completed for her each year? I transferred her over to you Altha Harm but was not sure if your or Sharyn Lull would need to help assist patient with this. Thanks so much!  Merleen Nicely

## 2022-06-08 NOTE — Telephone Encounter (Signed)
Called and gave pt below information. Pt states she will call [employer] to sign up for part B and f/u with DSS regarding medicaid. Pt knows to call with any further concerns.

## 2022-06-08 NOTE — Telephone Encounter (Signed)
Pt called voicing concern today stating she contacted medicaid and was informed she never should have been approved for BCCCP because she has medicare part A. Pt asked if she could d/c her medicare A and was told if she does she will lose her disability. Pt was offered to apply for adult medicaid and asked if she could also apply for medicare and if approved for medicaid, have them pay her medicare premium. Pt asks if Altha Harm is able to assist in this process. Advised pt to try and apply for medicare and I would be in touch with Altha Harm to see what other assistance we can offer. Pt knows to expect a call later from [place] or myself.

## 2022-06-09 ENCOUNTER — Other Ambulatory Visit: Payer: Self-pay

## 2022-06-09 ENCOUNTER — Telehealth: Payer: Self-pay

## 2022-06-09 DIAGNOSIS — C50911 Malignant neoplasm of unspecified site of right female breast: Secondary | ICD-10-CM

## 2022-06-09 NOTE — Telephone Encounter (Signed)
Pt is scheduled for echo 7/12 at 0800 at [facility] and port placement at [facility] 7/12 at 1100. Pt given instructions for NPO status, need for driver and 81LX supervision. Pt verbalized understanding of all and knows to call with any further questions/concerns.

## 2022-06-09 NOTE — Telephone Encounter (Signed)
Pt called and informed me she was able to submit application for medicare part b and once enrolled she will sign up for [employer], then attempt medicaid as secondary since disabled. She would like to have echo and port placement before 7/31 when her BCCCP expires. Pt was provided with number [phone] for echo scheduling. Will continue to work with IR to get pt scheduled for port placement before 7/31.

## 2022-06-13 ENCOUNTER — Other Ambulatory Visit (HOSPITAL_COMMUNITY): Payer: Self-pay | Admitting: Student

## 2022-06-13 ENCOUNTER — Other Ambulatory Visit: Payer: Self-pay | Admitting: Student

## 2022-06-14 ENCOUNTER — Ambulatory Visit (HOSPITAL_BASED_OUTPATIENT_CLINIC_OR_DEPARTMENT_OTHER)
Admission: RE | Admit: 2022-06-14 | Discharge: 2022-06-14 | Disposition: A | Discharge status: Home / Self Care | Payer: Medicaid Other | Source: Ambulatory Visit | Attending: Hematology and Oncology | Admitting: Hematology and Oncology

## 2022-06-14 ENCOUNTER — Other Ambulatory Visit (HOSPITAL_COMMUNITY): Payer: Medicaid Other

## 2022-06-14 ENCOUNTER — Other Ambulatory Visit: Payer: Self-pay

## 2022-06-14 ENCOUNTER — Ambulatory Visit (HOSPITAL_COMMUNITY)
Admission: RE | Admit: 2022-06-14 | Discharge: 2022-06-14 | Disposition: A | Discharge status: Home / Self Care | Payer: Medicaid Other | Source: Ambulatory Visit | Attending: Hematology and Oncology | Admitting: Hematology and Oncology

## 2022-06-14 ENCOUNTER — Other Ambulatory Visit (HOSPITAL_COMMUNITY): Payer: Self-pay

## 2022-06-14 ENCOUNTER — Other Ambulatory Visit: Payer: Self-pay | Admitting: Hematology and Oncology

## 2022-06-14 ENCOUNTER — Encounter: Payer: Self-pay | Admitting: Hematology and Oncology

## 2022-06-14 ENCOUNTER — Other Ambulatory Visit: Payer: Self-pay | Admitting: *Deleted

## 2022-06-14 DIAGNOSIS — Z0189 Encounter for other specified special examinations: Secondary | ICD-10-CM | POA: Diagnosis not present

## 2022-06-14 DIAGNOSIS — C50411 Malignant neoplasm of upper-outer quadrant of right female breast: Secondary | ICD-10-CM

## 2022-06-14 DIAGNOSIS — E785 Hyperlipidemia, unspecified: Secondary | ICD-10-CM | POA: Diagnosis not present

## 2022-06-14 DIAGNOSIS — C50911 Malignant neoplasm of unspecified site of right female breast: Secondary | ICD-10-CM | POA: Diagnosis not present

## 2022-06-14 DIAGNOSIS — C50919 Malignant neoplasm of unspecified site of unspecified female breast: Secondary | ICD-10-CM | POA: Diagnosis not present

## 2022-06-14 DIAGNOSIS — I517 Cardiomegaly: Secondary | ICD-10-CM | POA: Diagnosis not present

## 2022-06-14 DIAGNOSIS — M797 Fibromyalgia: Secondary | ICD-10-CM | POA: Diagnosis not present

## 2022-06-14 DIAGNOSIS — F419 Anxiety disorder, unspecified: Secondary | ICD-10-CM | POA: Insufficient documentation

## 2022-06-14 DIAGNOSIS — F329 Major depressive disorder, single episode, unspecified: Secondary | ICD-10-CM | POA: Diagnosis not present

## 2022-06-14 DIAGNOSIS — Z87891 Personal history of nicotine dependence: Secondary | ICD-10-CM | POA: Insufficient documentation

## 2022-06-14 DIAGNOSIS — Z452 Encounter for adjustment and management of vascular access device: Secondary | ICD-10-CM | POA: Diagnosis not present

## 2022-06-14 HISTORY — PX: IR IMAGING GUIDED PORT INSERTION: IMG5740

## 2022-06-14 LAB — ECHOCARDIOGRAM COMPLETE
AR max vel: 2.18 cm2
AV Area VTI: 2.27 cm2
AV Area mean vel: 2.16 cm2
AV Mean grad: 4.5 mmHg
AV Peak grad: 8 mmHg
Ao pk vel: 1.42 m/s
Area-P 1/2: 3.5 cm2
Calc EF: 55 %
S' Lateral: 3.1 cm
Single Plane A2C EF: 53.6 %
Single Plane A4C EF: 56.1 %

## 2022-06-14 MED ORDER — PROCHLORPERAZINE MALEATE 10 MG PO TABS
10.0000 mg | ORAL_TABLET | Freq: Four times a day (QID) | ORAL | 1 refills | Status: DC | PRN
  Filled 2022-06-14: qty 30, 8d supply, fill #0

## 2022-06-14 MED ORDER — FENTANYL CITRATE (PF) 100 MCG/2ML IJ SOLN
INTRAMUSCULAR | Status: AC
  Filled 2022-06-14: qty 2

## 2022-06-14 MED ORDER — ONDANSETRON HCL 8 MG PO TABS: 8.0000 mg | ORAL_TABLET | Freq: Two times a day (BID) | ORAL | 1 refills | Status: DC | PRN

## 2022-06-14 MED ORDER — LIDOCAINE-PRILOCAINE 2.5-2.5 % EX CREA
TOPICAL_CREAM | CUTANEOUS | 3 refills | Status: DC
  Filled 2022-06-14: qty 30, 30d supply, fill #0

## 2022-06-14 MED ORDER — LIDOCAINE-EPINEPHRINE 1 %-1:100000 IJ SOLN
INTRAMUSCULAR | Status: AC
  Filled 2022-06-14: qty 1

## 2022-06-14 MED ORDER — HEPARIN SOD (PORK) LOCK FLUSH 100 UNIT/ML IV SOLN
INTRAVENOUS | Status: AC
  Filled 2022-06-14: qty 5

## 2022-06-14 MED ORDER — MIDAZOLAM HCL 2 MG/2ML IJ SOLN
INTRAMUSCULAR | Status: AC
  Filled 2022-06-14: qty 2

## 2022-06-14 MED ORDER — MIDAZOLAM HCL 2 MG/2ML IJ SOLN
INTRAMUSCULAR | Status: AC | PRN
  Administered 2022-06-14: 1 mg via INTRAVENOUS

## 2022-06-14 MED ORDER — PROCHLORPERAZINE MALEATE 10 MG PO TABS: 10.0000 mg | ORAL_TABLET | Freq: Four times a day (QID) | ORAL | 1 refills | Status: DC | PRN

## 2022-06-14 MED ORDER — ONDANSETRON HCL 8 MG PO TABS
8.0000 mg | ORAL_TABLET | Freq: Two times a day (BID) | ORAL | 1 refills | Status: DC | PRN
  Filled 2022-06-14: qty 30, 15d supply, fill #0

## 2022-06-14 MED ORDER — FENTANYL CITRATE (PF) 100 MCG/2ML IJ SOLN
INTRAMUSCULAR | Status: AC | PRN
  Administered 2022-06-14: 25 ug via INTRAVENOUS

## 2022-06-14 MED ORDER — LIDOCAINE-PRILOCAINE 2.5-2.5 % EX CREA: TOPICAL_CREAM | CUTANEOUS | 3 refills | Status: DC

## 2022-06-14 MED ORDER — SODIUM CHLORIDE 0.9 % IV SOLN: INTRAVENOUS | Status: DC

## 2022-06-14 NOTE — Progress Notes (Signed)
DISCONTINUE ON PATHWAY REGIMEN - Breast   Dose-Dense AC q14 days:   A cycle is every 14 days:     Doxorubicin        Dose Mod: None     Cyclophosphamide        Dose Mod: None     Pegfilgrastim        Dose Mod: None   Paclitaxel 80 mg/m2 Weekly:   Administer weekly:     Paclitaxel        Dose Mod: None  **Always confirm dose/schedule in your pharmacy ordering system**  REASON: Disease Progression PRIOR TREATMENT: [id number]: Dose-Dense AC-T (Paclitaxel Weekly) - [Doxorubicin + Cyclophosphamide q14 Days x 4 Cycles, Followed by Paclitaxel 80 mg/m2 Weekly x 12 Weeks] TREATMENT RESPONSE: Partial Response (PR)  START ON PATHWAY REGIMEN - Breast     A cycle is every 21 days:     Fam-trastuzumab deruxtecan-nxki   **Always confirm dose/schedule in your pharmacy ordering system**  Patient Characteristics: Distant Metastases or Locoregional Recurrent Disease - Unresected or Locally Advanced Unresectable Disease Progressing after Neoadjuvant and Local Therapies, ER Positive, Chemotherapy, HER2 Low, Third Line and Beyond, Prior or Contraindicated  Anthracycline and Prior Eribulin Therapeutic Status: Distant Metastases HER2 Status: Low ER Status: Positive (+) PR Status: Positive (+) Therapy Approach Indicated: Standard Chemotherapy/Endocrine Therapy Line of Therapy: Third Line and Beyond Intent of Therapy: Non-Curative / Palliative Intent, Discussed with Patient

## 2022-06-14 NOTE — Progress Notes (Signed)
  Echocardiogram 2D Echocardiogram has been performed.  Joette Catching 06/14/2022, 8:39 AM
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
## 2022-06-14 NOTE — Procedures (Signed)
Interventional Radiology Procedure Note  Procedure: Placement of a right IJ approach single lumen PowerPort.  Tip is positioned at the superior cavoatrial junction and catheter is ready for immediate use.  Complications: No immediate Recommendations:  - Ok to shower tomorrow - Do not submerge for 7 days - Routine line care   Signed,  Katielynn Horan K. Kaylinn Dedic, MD   

## 2022-06-15 ENCOUNTER — Telehealth: Payer: Self-pay | Admitting: Hematology and Oncology

## 2022-06-15 ENCOUNTER — Inpatient Hospital Stay: Payer: Medicaid Other | Attending: Oncology | Admitting: Licensed Clinical Social Worker

## 2022-06-15 ENCOUNTER — Encounter: Payer: Self-pay | Admitting: Hematology and Oncology

## 2022-06-15 DIAGNOSIS — R590 Localized enlarged lymph nodes: Secondary | ICD-10-CM | POA: Insufficient documentation

## 2022-06-15 DIAGNOSIS — C787 Secondary malignant neoplasm of liver and intrahepatic bile duct: Secondary | ICD-10-CM | POA: Insufficient documentation

## 2022-06-15 DIAGNOSIS — Z170 Estrogen receptor positive status [ER+]: Secondary | ICD-10-CM | POA: Insufficient documentation

## 2022-06-15 DIAGNOSIS — C7951 Secondary malignant neoplasm of bone: Secondary | ICD-10-CM | POA: Insufficient documentation

## 2022-06-15 DIAGNOSIS — Z5112 Encounter for antineoplastic immunotherapy: Secondary | ICD-10-CM | POA: Insufficient documentation

## 2022-06-15 DIAGNOSIS — Z79899 Other long term (current) drug therapy: Secondary | ICD-10-CM | POA: Insufficient documentation

## 2022-06-15 DIAGNOSIS — C50411 Malignant neoplasm of upper-outer quadrant of right female breast: Secondary | ICD-10-CM | POA: Insufficient documentation

## 2022-06-15 DIAGNOSIS — G620 Drug-induced polyneuropathy: Secondary | ICD-10-CM | POA: Insufficient documentation

## 2022-06-15 DIAGNOSIS — C773 Secondary and unspecified malignant neoplasm of axilla and upper limb lymph nodes: Secondary | ICD-10-CM | POA: Insufficient documentation

## 2022-06-15 DIAGNOSIS — Z79811 Long term (current) use of aromatase inhibitors: Secondary | ICD-10-CM | POA: Insufficient documentation

## 2022-06-15 DIAGNOSIS — J90 Pleural effusion, not elsewhere classified: Secondary | ICD-10-CM | POA: Insufficient documentation

## 2022-06-15 DIAGNOSIS — Z79624 Long term (current) use of inhibitors of nucleotide synthesis: Secondary | ICD-10-CM | POA: Insufficient documentation

## 2022-06-15 DIAGNOSIS — T451X5A Adverse effect of antineoplastic and immunosuppressive drugs, initial encounter: Secondary | ICD-10-CM | POA: Insufficient documentation

## 2022-06-15 NOTE — Progress Notes (Signed)
[facility]  Clinical Social Work was referred by nurse for assessment of needs related to insurance.  Clinical Social Worker contacted patient by phone  to offer support and assess for needs.    Patient has Medicare Part A and previously had BCC[employer] Medicaid which has now ended. She is trying to sign up for Medicare Part B but is receiving differing information from social security on whether she can do that now or if she has to wait until January for regular enrollment. CSW shared information from [facility] website about special enrollment periods for which she should qualify (e-mailed to pt). Also provided the contact information for the [facility] counselors through [employer] to assist with answering Medicare general and plan questions.  Pt had also looked into [employer] but the lowest cost plan that covers her providers is still too expensive (over $800/month).  Patient will look into the above information and utilize it to try to sign up for Part B coverage.  Pt also noted that she has a bandage on after procedure yesterday and her skin is reacting to the adhesive tape. She would like to add this to the allergy list. CSW sent message to nurse with this information and request to notify pt of other options for keeping bandage on.     [place], [place] Worker [employer]

## 2022-06-15 NOTE — Telephone Encounter (Signed)
.  Called patient to schedule appointment per 7/13 inbasket, patient is aware of date and time.   

## 2022-06-16 ENCOUNTER — Other Ambulatory Visit (HOSPITAL_COMMUNITY): Payer: Self-pay

## 2022-06-16 NOTE — Progress Notes (Signed)
..  Patient is receiving Assistance Medication - Supplied Externally. Medication: Enhertu Manufacture: [employer] Approval Dates: Approved from 07/04/2022 until 12/03/2022. ID: [id number] Reason: Self Pay (Medicare A only) First DOS: 07/14/2022

## 2022-06-19 ENCOUNTER — Telehealth: Payer: Self-pay | Admitting: *Deleted

## 2022-06-19 ENCOUNTER — Inpatient Hospital Stay: Payer: Medicaid Other

## 2022-06-19 NOTE — Telephone Encounter (Signed)
Called pt this am & discussed whether she needs to come for education.  She has been getting treatment since 2017 & had education class at that time.  She met with John, Phamacist to discuss Enhertu.  Pt agreed that she did not need to come in for class.  Will Cancel.   

## 2022-06-20 ENCOUNTER — Inpatient Hospital Stay: Payer: Medicaid Other | Admitting: Hematology and Oncology

## 2022-06-20 ENCOUNTER — Other Ambulatory Visit: Payer: Self-pay

## 2022-06-20 ENCOUNTER — Inpatient Hospital Stay: Payer: Medicaid Other

## 2022-06-20 ENCOUNTER — Inpatient Hospital Stay (HOSPITAL_BASED_OUTPATIENT_CLINIC_OR_DEPARTMENT_OTHER): Payer: Medicaid Other | Admitting: Hematology and Oncology

## 2022-06-20 ENCOUNTER — Telehealth: Payer: Self-pay

## 2022-06-20 DIAGNOSIS — Z79811 Long term (current) use of aromatase inhibitors: Secondary | ICD-10-CM | POA: Diagnosis not present

## 2022-06-20 DIAGNOSIS — R590 Localized enlarged lymph nodes: Secondary | ICD-10-CM | POA: Diagnosis not present

## 2022-06-20 DIAGNOSIS — G620 Drug-induced polyneuropathy: Secondary | ICD-10-CM | POA: Diagnosis not present

## 2022-06-20 DIAGNOSIS — Z170 Estrogen receptor positive status [ER+]: Secondary | ICD-10-CM | POA: Diagnosis not present

## 2022-06-20 DIAGNOSIS — C50411 Malignant neoplasm of upper-outer quadrant of right female breast: Secondary | ICD-10-CM

## 2022-06-20 DIAGNOSIS — T451X5A Adverse effect of antineoplastic and immunosuppressive drugs, initial encounter: Secondary | ICD-10-CM | POA: Diagnosis not present

## 2022-06-20 DIAGNOSIS — C50919 Malignant neoplasm of unspecified site of unspecified female breast: Secondary | ICD-10-CM

## 2022-06-20 DIAGNOSIS — Z5112 Encounter for antineoplastic immunotherapy: Secondary | ICD-10-CM | POA: Diagnosis present

## 2022-06-20 DIAGNOSIS — C787 Secondary malignant neoplasm of liver and intrahepatic bile duct: Secondary | ICD-10-CM | POA: Diagnosis not present

## 2022-06-20 DIAGNOSIS — C773 Secondary and unspecified malignant neoplasm of axilla and upper limb lymph nodes: Secondary | ICD-10-CM | POA: Diagnosis not present

## 2022-06-20 DIAGNOSIS — C7951 Secondary malignant neoplasm of bone: Secondary | ICD-10-CM | POA: Diagnosis not present

## 2022-06-20 DIAGNOSIS — Z79624 Long term (current) use of inhibitors of nucleotide synthesis: Secondary | ICD-10-CM | POA: Diagnosis not present

## 2022-06-20 DIAGNOSIS — C50911 Malignant neoplasm of unspecified site of right female breast: Secondary | ICD-10-CM

## 2022-06-20 DIAGNOSIS — J90 Pleural effusion, not elsewhere classified: Secondary | ICD-10-CM | POA: Diagnosis not present

## 2022-06-20 DIAGNOSIS — Z79899 Other long term (current) drug therapy: Secondary | ICD-10-CM | POA: Diagnosis not present

## 2022-06-20 LAB — CMP (CANCER CENTER ONLY)
ALT: 21 U/L (ref 0–44)
AST: 27 U/L (ref 15–41)
Albumin: 3.9 g/dL (ref 3.5–5.0)
Alkaline Phosphatase: 77 U/L (ref 38–126)
Anion gap: 8 (ref 5–15)
BUN: 12 mg/dL (ref 6–20)
CO2: 23 mmol/L (ref 22–32)
Calcium: 9 mg/dL (ref 8.9–10.3)
Chloride: 106 mmol/L (ref 98–111)
Creatinine: 0.76 mg/dL (ref 0.44–1.00)
GFR, Estimated: >60 mL/min (ref >60)
Glucose, Bld: 98 mg/dL (ref 70–99)
Potassium: 3.9 mmol/L (ref 3.5–5.1)
Sodium: 137 mmol/L (ref 135–145)
Total Bilirubin: 0.5 mg/dL (ref 0.3–1.2)
Total Protein: 6.9 g/dL (ref 6.5–8.1)

## 2022-06-20 LAB — CBC WITH DIFFERENTIAL (CANCER CENTER ONLY)
Abs Immature Granulocytes: 0.01 10*3/uL (ref 0.00–0.07)
Basophils Absolute: 0 10*3/uL (ref 0.0–0.1)
Basophils Relative: 0 %
Eosinophils Absolute: 0.1 10*3/uL (ref 0.0–0.5)
Eosinophils Relative: 1 %
HCT: 35.8 % — ABNORMAL LOW (ref 36.0–46.0)
Hemoglobin: 12.7 g/dL (ref 12.0–15.0)
Immature Granulocytes: 0 %
Lymphocytes Relative: 14 %
Lymphs Abs: 0.8 10*3/uL (ref 0.7–4.0)
MCH: 37.4 pg — ABNORMAL HIGH (ref 26.0–34.0)
MCHC: 35.5 g/dL (ref 30.0–36.0)
MCV: 105.3 fL — ABNORMAL HIGH (ref 80.0–100.0)
Monocytes Absolute: 0.6 10*3/uL (ref 0.1–1.0)
Monocytes Relative: 11 %
Neutro Abs: 4.2 10*3/uL (ref 1.7–7.7)
Neutrophils Relative %: 74 %
Platelet Count: 213 10*3/uL (ref 150–400)
RBC: 3.4 MIL/uL — ABNORMAL LOW (ref 3.87–5.11)
RDW: 14.9 % (ref 11.5–15.5)
WBC Count: 5.7 10*3/uL (ref 4.0–10.5)
nRBC: 0 % (ref 0.0–0.2)

## 2022-06-20 MED FILL — Dexamethasone Sodium Phosphate Inj 100 MG/10ML: INTRAMUSCULAR | Qty: 1 | Status: AC

## 2022-06-20 NOTE — Telephone Encounter (Signed)
Pt aware she is approved for drug assist. Awaiting answer on medicare

## 2022-06-20 NOTE — Assessment & Plan Note (Addendum)
1. 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  OnVerzinio to 100 mg bid  2.bone metastasis: Currently on Xgeva every 3 months.  3. Chemotherapy-induced peripheral neuropathy. This is improving and now intermittent only noticeable in her toes. She is not having any pain or balance issues related to this.  CT CAP 02/01/2022: Mild progression of disease with increased number and size of the numerous lung nodules in the right lung. Persistent lymphadenopathy and right hilar and right paratracheal nodal areas, numerous hypovascular hepatic lesions slightly larger when compared to previous.  CA 27-29: 226 on 02/01/2022 (gradually progressing)  Plan: [facility] molecular testing: ER positive, ER positive, TMB 5, PD-L1 negative, PIK 3 CA negative  -------------------------------------------------------- Current treatment: Enhertu cycle 1 day 1 Chemo counseling completed and chemo consent obtained. Return to clinic in 3 weeks for cycle 2

## 2022-06-20 NOTE — Telephone Encounter (Signed)
----- Message from Lynnae Sandhoff, LPN sent at 7/37/1062 11:10 AM EDT ----- Regarding: RE: no insurance So, just for clarification, is it OK that April [name] is scheduled for Enhertu 7/19 since she still has BCCCP? Just want to clarify.   Thanks, April [name] ----- Message ----- From: Tollie Pizza, CPhT Sent: 06/19/2022  11:40 AM EDT To: Thurnell Garbe, RN; Merril Abbe, LPN; # Subject: RE: no insurance                               She is approved for assistance for Enhertu once current insurance expires 7/31.   I have to wait on the Xgeva until it is completely expired, but they said it will be no problem to approve the assistance after that.   Thank you, April [name]   ----- Message ----- From: Lynnae Sandhoff, LPN Sent: 6/94/8546  11:30 AM EDT To: Thurnell Garbe, RN; Merril Abbe, LPN; # Subject: RE: no insurance                               Hi all,  When I spoke with the pt last week she said she is ineligible for BCCCP because she has medicare part A alongside her disability. BCCCP told her she should have never been approved through them. I spoke with the pt today. She has been in contact with adult medicaid and has been told she can not apply for adult medicaid until her BCCCP runs out, which is 7/31. Medicare is still pending as she was able to get all documents submitted. She is going to continue to f/u on this, but in the mean time she will need financial assistance because her BCCCP will lapse 7/31. Can we proceed with drug assist?   She was supposed to come in today for chemo ed, but she states someone called her and told her she does not need chemo ed since she has had chemo before, and since she met with April [name] regarding enhertu 6/27.   OK to proceed with drug assist?  Thanks, April Baas, LPN ----- Message ----- From: Loletta Parish, RN Sent: 06/15/2022  10:00 AM EDT To: Thurnell Garbe, RN; Merril Abbe, LPN; # Subject: RE: no insurance                                Hello April [name],  I have copied [name] to complete her BCCCP Medicaid recert. I just realized her last name was different when she was initially approved for [facility] Medicaid.   Thanks, April [name] ----- Message ----- From: Myrtie Hawk, RN Sent: 06/15/2022   8:57 AM EDT To: Thurnell Garbe, RN; Merril Abbe, LPN; # Subject: RE: no insurance                               I am going to add April [name] onto this because I am confused now.  Has her BCCCP Medicaid need or is it still in process until the end of July.   April [name] ----- Message ----- From: April [name] Sent: 06/15/2022   8:46 AM EDT To: Thurnell Garbe, RN; Merril Abbe, LPN; # Subject: RE: no insurance  I just spoke with her. Since her BCCCP Medicaid ended, she has tried to contact social security to get signed up for Part B, but she is getting a run-around and people who aren't helpful. There is information on the Medicare website that says she would qualify for a special enrollment period since Medicaid ended, so I am sending that to her as well as the contact for the [facility] program through [employer] to have additional back-up in getting signed up for Part B and then potentially Part D. She is looking into that now.   April [name]  ----- Message ----- From: Annamary Rummage A Sent: 06/15/2022   8:36 AM EDT To: Thurnell Garbe, RN; Merril Abbe, LPN; # Subject: RE: no insurance                               Good morning!  As of today, she has Medicare and Medicaid that was verified in her chart so unfortunately until those are removed she doesn't qualify for assistance.  Medicare will pay 80% and Medicaid will pay the 20% coins.  ----- Message ----- From: Myrtie Hawk, RN Sent: 06/14/2022   4:08 PM EDT To: Thurnell Garbe, RN; Merril Abbe, LPN; # Subject: no insurance                                   Hi team, [name] is going to start  Enhertu treatment ideally next week.  She just called and stated she does not qualify for medicare or Medicaid. She can not afford 800$ a month in insurance either.  Can we please help her. She wont even be able to afford her medications at the pharmacy to help with tx.

## 2022-06-20 NOTE — Progress Notes (Signed)
Patient Care Team: [name], MD as PCP - General (Internal Medicine) [name], MD as Attending Physician (Radiation Oncology) [name], [name], NP as Nurse Practitioner (Hematology and Oncology) [name], MD as Consulting Physician (General Surgery) [name], DPM as Consulting Physician (Podiatry) [name], MD as Consulting Physician (Gastroenterology) [name], MD as Consulting Physician (Hematology and Oncology)  DIAGNOSIS:  Encounter Diagnosis  Name Primary?   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 Right axilla core biopsy: carcinoma, grade 3, ER+(100%), PR+(90%), Ki67 25%, HER-2 negative (ratio 1.13).    07/17/2016 - 11/06/2016 Neo-Adjuvant Chemotherapy   Received 2 cycles of Doxorubicin and Cyclophosphamide, then transitioned to weekly Paclitaxel (due to repeated febrile neutropenia) x 7 cycles, stopped early due to peripheral neuropathy, then completed her final 2 cycles of Doxorubicin and Cyclophosphamide.    12/19/2016 Surgery   Right breast lumpectomy ([place]): IDC, grade 2, 1.6cm and 0.3cm, margins negative, 3 SLN positive for metastatic carcinoma.     12/26/2016 Surgery    ALND: metastatic carcinoma in one of 20 lymph nodes, and three nodes from previous lumpectomy.  Four positive nodes, consistent with pN2a.   02/07/2017 - 03/21/2017 Radiation Therapy   Adjuvant radiation Isidore Moos): 1) Right breast and nodes - 4 field: 50 Gy in 25 fractions. IM NODES: >95% receive at least 45Gy/71f. 50Gy to SCLV/PAB @ 2Gy /fraction x 25 fractions. 2) Right breast boost: 10 Gy in 5 fractions   04/2017 -  Anti-estrogen oral therapy   Anastrozole 145mdaily.  Bone density 03/23/2017 finds T score of -2.6, osteoporosis, plan to start Prolia following dental clearance Anastrozole stopped 01/16/18 Exemestane 25 mg daily 02/12/18  On [name], trial randomized to endocrine therapy alone   05/27/2020 Progression   chest CT scan 05/27/2020 shows bulky mediastinal and right hilar lymphadenopathy with right pleural nodules and a small right pleural effusion, no evidence of liver or bone involvement             (a) biopsy of right breast mass 06/02/2020 shows invasive ductal carcinoma, estrogen and progesterone receptor positive, HER-2 not amplified, with an MIB-1 of 40%   06/17/2020 Treatment Plan Change    fulvestrant to start 06/17/2020             (a) palbociclib to start 06/17/2020 at 125 mg daily 21 days on 7 days off             (b) palbociclib decreased to 12537mvery other day x 11 doses on 07/23/2020             (c) palbociclib dose decreased to100 mg daily, 21/7, starting with September cycle             (d) Palbociclib decreased to 1m74mily beginning with March cycle due to oral  ulcers             (e) palbociclib discontinued November 2022 with progression   09/14/2021 PET scan   PET scan 09/14/2021 shows progression in liver and bone   10/05/2021 Pathology Results   liver biopsy 10/05/2021 confirms metastatic carcinoma, estrogen receptor strongly positive, progesterone receptor and HER2 negative, with an MIB-1 of 30%.   10/2021 Treatment Plan Change   fulvestrant continued, Xgeva  added, palbociclib changed to abemaciclib November 2022 -Dose Decreased to $RemoveBefo'50mg'vCTxpXEVUDB$  PO BID Daily   03/07/2022 Miscellaneous   [facility] molecular testing: ER positive, ER positive, MTAP: Detected, ESR 1 negative, BRCA 1 and 2 negative, MSI stable, PD-L1 negative, PIK 3 CA negative, PR negative,   06/21/2022 -  Chemotherapy   Patient is on Treatment Plan : BREAST METASTATIC fam-trastuzumab deruxtecan-nxki (Enhertu) q21d     Breast cancer, stage 4 ([place])  03/09/2022 Initial Diagnosis   Breast cancer, stage 4 ([place])   06/21/2022 -  Chemotherapy   Patient is on Treatment Plan : BREAST METASTATIC fam-trastuzumab deruxtecan-nxki (Enhertu) q21d       CHIEF COMPLIANT: Follow-up metastatic breast cancer on Enhertu  INTERVAL HISTORY: April [name] is a 61 y.o. with above-mentioned history of ER+ breast cancer. She presents to the clinic today for a follow-up.   ALLERGIES:  is allergic to tape, codeine, cymbalta [duloxetine hcl], hydrocodone, ultram [tramadol hcl], venlafaxine, and gabapentin.  MEDICATIONS:  Current Outpatient Medications  Medication Sig Dispense Refill   Denosumab (XGEVA [place]) Inject 120 mg into the skin every 3 (three) months.     famotidine (PEPCID) 20 MG tablet Take 20 mg by mouth daily as needed for heartburn or indigestion.     lidocaine-prilocaine (EMLA) cream Apply to affected area once 30 g 3   loratadine (CLARITIN) 10 MG tablet Take 10 mg by mouth daily.     naproxen sodium (ALEVE) 220 MG tablet Take 440-660 mg by mouth 2 (two) times daily as needed (pain).     omeprazole (PRILOSEC) 20 MG capsule Take 20 mg by mouth daily.     ondansetron (ZOFRAN) 8 MG tablet Take 1 tablet (8 mg total) by mouth 2 (two) times daily as needed for refractory nausea / vomiting. Start on day 3 after chemo. 30 tablet 1   prochlorperazine (COMPAZINE) 10 MG tablet Take 1 tablet (10 mg total) by mouth every 6 (six) hours as needed for nausea or vomiting. 30 tablet 1   rizatriptan (MAXALT) 5 MG tablet Take 1  tablet (5 mg total) by mouth as needed for migraine. May repeat in 2 hours if needed 10 tablet 0   simethicone (MYLICON) 450 MG chewable tablet Chew 125 mg by mouth every 6 (six) hours as needed for flatulence.     valACYclovir (VALTREX) 1000 MG tablet Take 1 tablet (1,000 mg total) by mouth 2 (two) times daily. 1/2 tab BID (Patient taking differently: Take 500 mg by mouth See admin instructions. Take 500 mg daily, may take a second 500 mg dose as needed for outbreaks) 180 tablet 4   No current facility-administered medications for this visit.    PHYSICAL EXAMINATION: ECOG PERFORMANCE STATUS: 1 - Symptomatic but completely ambulatory  Vitals:   06/20/22 1415  BP: 123/74  Pulse: 69  Resp: 17  Temp: (!) 97.3 F (36.3 C)  SpO2: 99%   Filed Weights   06/20/22 1415  Weight: 206 lb 9.6 oz (93.7 kg)      LABORATORY DATA:  I have reviewed the data as  listed    Latest Ref Rng & Units 05/30/2022    2:26 PM 05/08/2022   12:59 PM 04/17/2022    2:29 PM  CMP  Glucose 70 - 99 mg/dL 100  104  107   BUN 6 - 20 mg/dL $Remove'15  16  16   'IjrBocH$ Creatinine 0.44 - 1.00 mg/dL 0.93  0.98  0.94   Sodium 135 - 145 mmol/L 136  139  139   Potassium 3.5 - 5.1 mmol/L 4.3  3.9  3.8   Chloride 98 - 111 mmol/L 106  110  108   CO2 22 - 32 mmol/L $RemoveB'25  24  26   'BJUeRBIl$ Calcium 8.9 - 10.3 mg/dL 9.2  9.0  9.1   Total Protein 6.5 - 8.1 g/dL 6.7  6.3  6.3   Total Bilirubin 0.3 - 1.2 mg/dL 0.5  0.4  0.4   Alkaline Phos 38 - 126 U/L 74  92  83   AST 15 - 41 U/L 46  29  22   ALT 0 - 44 U/L 38  25  19     Lab Results  Component Value Date   WBC 5.7 06/20/2022   HGB 12.7 06/20/2022   HCT 35.8 (L) 06/20/2022   MCV 105.3 (H) 06/20/2022   PLT 213 06/20/2022   NEUTROABS 4.2 06/20/2022    ASSESSMENT & PLAN:  Malignant neoplasm of upper-outer quadrant of right breast in female, estrogen receptor positive ([place]) 1.  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  On  Verzinio to 100 mg bid   2. bone metastasis: Currently on Xgeva every 3 months.   3.  Chemotherapy-induced peripheral neuropathy.  This is improving and now intermittent only noticeable in her toes.  She is not having any pain or balance issues related to this.   CT CAP 02/01/2022: Mild progression of disease with increased number and size of the numerous lung nodules in the right lung.  Persistent lymphadenopathy and right hilar and right paratracheal nodal areas, numerous hypovascular hepatic lesions slightly larger when compared to previous.   CA 27-29: 226 on 02/01/2022 (gradually progressing)   Plan: [facility] molecular testing: ER positive, ER positive, TMB 5, PD-L1 negative, PIK 3 CA negative  -------------------------------------------------------- Current treatment: Enhertu cycle 1 day 1 Echocardiogram: 06/14/2022: EF 55 to 60% Chemo counseling completed and chemo consent obtained. Return to clinic in 3 weeks for cycle 2    No orders of the defined types were placed in this encounter.  The patient has a good understanding of the overall plan. she agrees with it. she will call with any problems that may develop before the next visit here. Total time spent: 30 mins including face to face time and time spent for planning, charting and co-ordination of care   Harriette Ohara, MD 06/20/22    I Gardiner Coins am scribing for Dr. Lindi Adie  I have reviewed the above documentation for accuracy and completeness, and I agree with the above.

## 2022-06-21 ENCOUNTER — Encounter: Payer: Self-pay | Admitting: Radiology

## 2022-06-21 ENCOUNTER — Inpatient Hospital Stay: Payer: Medicaid Other

## 2022-06-21 ENCOUNTER — Telehealth: Payer: Self-pay | Admitting: Hematology and Oncology

## 2022-06-21 VITALS — BP 118/76 | HR 90 | Temp 97.9°F | Resp 17

## 2022-06-21 DIAGNOSIS — C50911 Malignant neoplasm of unspecified site of right female breast: Secondary | ICD-10-CM

## 2022-06-21 DIAGNOSIS — C50411 Malignant neoplasm of upper-outer quadrant of right female breast: Secondary | ICD-10-CM

## 2022-06-21 DIAGNOSIS — C50919 Malignant neoplasm of unspecified site of unspecified female breast: Secondary | ICD-10-CM

## 2022-06-21 DIAGNOSIS — Z5112 Encounter for antineoplastic immunotherapy: Secondary | ICD-10-CM | POA: Diagnosis not present

## 2022-06-21 LAB — CANCER ANTIGEN 27.29: CA 27.29: 942.1 U/mL — ABNORMAL HIGH (ref 0.0–38.6)

## 2022-06-21 MED ORDER — ACETAMINOPHEN 325 MG PO TABS
650.0000 mg | ORAL_TABLET | Freq: Once | ORAL | Status: AC
  Administered 2022-06-21: 650 mg via ORAL
  Filled 2022-06-21: qty 2

## 2022-06-21 MED ORDER — PALONOSETRON HCL INJECTION 0.25 MG/5ML
0.2500 mg | Freq: Once | INTRAVENOUS | Status: AC
  Administered 2022-06-21: 0.25 mg via INTRAVENOUS
  Filled 2022-06-21: qty 5

## 2022-06-21 MED ORDER — HEPARIN SOD (PORK) LOCK FLUSH 100 UNIT/ML IV SOLN
500.0000 [IU] | Freq: Once | INTRAVENOUS | Status: AC | PRN
  Administered 2022-06-21: 500 [IU]

## 2022-06-21 MED ORDER — SODIUM CHLORIDE 0.9% FLUSH
10.0000 mL | INTRAVENOUS | Status: DC | PRN
  Administered 2022-06-21: 10 mL

## 2022-06-21 MED ORDER — FAM-TRASTUZUMAB DERUXTECAN-NXKI CHEMO 100 MG IV SOLR
5.3700 mg/kg | Freq: Once | INTRAVENOUS | Status: AC
  Administered 2022-06-21: 500 mg via INTRAVENOUS
  Filled 2022-06-21: qty 25

## 2022-06-21 MED ORDER — DIPHENHYDRAMINE HCL 25 MG PO CAPS
50.0000 mg | ORAL_CAPSULE | Freq: Once | ORAL | Status: AC
  Administered 2022-06-21: 50 mg via ORAL
  Filled 2022-06-21: qty 2

## 2022-06-21 MED ORDER — SODIUM CHLORIDE 0.9 % IV SOLN
10.0000 mg | Freq: Once | INTRAVENOUS | Status: AC
  Administered 2022-06-21: 10 mg via INTRAVENOUS
  Filled 2022-06-21: qty 10

## 2022-06-21 MED ORDER — DEXTROSE 5 % IV SOLN: Freq: Once | INTRAVENOUS | Status: AC

## 2022-06-21 NOTE — Telephone Encounter (Signed)
Scheduled appointment per 7/18 los. Patient is aware.

## 2022-06-21 NOTE — Patient Instructions (Signed)
[facility]  Discharge Instructions: Thank you for choosing [facility] to provide your oncology and hematology care.   If you have a lab appointment with the [facility], please go directly to the [facility] and check in at the registration area.   Wear comfortable clothing and clothing appropriate for easy access to any Portacath or PICC line.   We strive to give you quality time with your provider. You may need to reschedule your appointment if you arrive late (15 or more minutes).  Arriving late affects you and other patients whose appointments are after yours.  Also, if you miss three or more appointments without notifying the office, you may be dismissed from the clinic at the provider's discretion.      For prescription refill requests, have your pharmacy contact our office and allow 72 hours for refills to be completed.    Today you received the following chemotherapy and/or immunotherapy agent: Enhertu      To help prevent nausea and vomiting after your treatment, we encourage you to take your nausea medication as directed.  BELOW ARE SYMPTOMS THAT SHOULD BE REPORTED IMMEDIATELY: *FEVER GREATER THAN 100.4 F (38 C) OR HIGHER *CHILLS OR SWEATING *NAUSEA AND VOMITING THAT IS NOT CONTROLLED WITH YOUR NAUSEA MEDICATION *UNUSUAL SHORTNESS OF BREATH *UNUSUAL BRUISING OR BLEEDING *URINARY PROBLEMS (pain or burning when urinating, or frequent urination) *BOWEL PROBLEMS (unusual diarrhea, constipation, pain near the anus) TENDERNESS IN MOUTH AND THROAT WITH OR WITHOUT PRESENCE OF ULCERS (sore throat, sores in mouth, or a toothache) UNUSUAL RASH, SWELLING OR PAIN  UNUSUAL VAGINAL DISCHARGE OR ITCHING   Items with * indicate a potential emergency and should be followed up as soon as possible or go to the Emergency Department if any problems should occur.  Please show the CHEMOTHERAPY ALERT CARD or IMMUNOTHERAPY ALERT CARD at check-in to the  Emergency Department and triage nurse.  Should you have questions after your visit or need to cancel or reschedule your appointment, please contact [facility]  Dept: [phone]  and follow the prompts.  Office hours are 8:00 a.m. to 4:30 p.m. Monday - Friday. Please note that voicemails left after 4:00 p.m. may not be returned until the following business day.  We are closed weekends and major holidays. You have access to a nurse at all times for urgent questions. Please call the main number to the clinic Dept: [phone] and follow the prompts.   For any non-urgent questions, you may also contact your provider using MyChart. We now offer e-Visits for anyone 17 and older to request care online for non-urgent symptoms. For details visit [email or web address].   Also download the MyChart app! Go to the app store, search "MyChart", open the app, select [facility], and log in with your MyChart username and password.  Masks are optional in the cancer centers. If you would like for your care team to wear a mask while they are taking care of you, please let them know. For doctor visits, patients may have with them one support person who is at least 59 years old. At this time, visitors are not allowed in the infusion area.  Fam-Trastuzumab Deruxtecan Injection What is this medication? FAM-TRASTUZUMAB DERUXTECAN (fam-tras TOOZ eu mab DER ux TEE kan) treats some types of cancer. It works by blocking a protein that causes cancer cells to grow and multiply. This helps to slow or stop the spread of cancer cells. This medicine may  be used for other purposes; ask your health care provider or pharmacist if you have questions. COMMON BRAND NAME(S): ENHERTU What should I tell my care team before I take this medication? They need to know if you have any of these conditions: Heart disease Heart failure Infection, especially a viral infection, such as chickenpox, cold sores, or  herpes Liver disease Lung or breathing disease, such as asthma or COPD An unusual or allergic reaction to fam-trastuzumab deruxtecan, other medications, foods, dyes, or preservatives Pregnant or trying to get pregnant Breast-feeding How should I use this medication? This medication is injected into a vein. It is given by your care team in a hospital or clinic setting. A special MedGuide will be given to you before each treatment. Be sure to read this information carefully each time. Talk to your care team about the use of this medication in children. Special care may be needed. Overdosage: If you think you have taken too much of this medicine contact a poison control center or emergency room at once. NOTE: This medicine is only for you. Do not share this medicine with others. What if I miss a dose? It is important not to miss your dose. Call your care team if you are unable to keep an appointment. What may interact with this medication? Interactions are not expected. This list may not describe all possible interactions. Give your health care provider a list of all the medicines, herbs, non-prescription drugs, or dietary supplements you use. Also tell them if you smoke, drink alcohol, or use illegal drugs. Some items may interact with your medicine. What should I watch for while using this medication? Visit your care team for regular checks on your progress. Tell your care team if your symptoms do not start to get better or if they get worse. Your condition will be monitored carefully while you are receiving this medication. Do not become pregnant while taking this medication or for 7 months after stopping it. Women should inform their care team if they wish to become pregnant or think they might be pregnant. Men should not father a child while taking this medication and for 4 months after stopping it. There is potential for serious side effects to an unborn child. Talk to your care team for more  information. Do not breast-feed an infant while taking this medication or for 7 months after the last dose. This medication has caused decreased sperm counts in some men. This may make it more difficult to father a child. Talk to your care team if you are concerned about your fertility. This medication may increase your risk to bruise or bleed. Call your care team if you notice any unusual bleeding. Be careful brushing or flossing your teeth or using a toothpick because you may get an infection or bleed more easily. If you have any dental work done, tell your dentist you are receiving this medication. This medication may cause dry eyes and blurred vision. If you wear contact lenses, you may feel some discomfort. Lubricating eye drops may help. See your care team if the problem does not go away or is severe. This medication may increase your risk of getting an infection. Call your care team for advice if you get a fever, chills, sore throat, or other symptoms of a cold or flu. Do not treat yourself. Try to avoid being around people who are sick. Avoid taking medications that contain aspirin, acetaminophen, ibuprofen, naproxen, or ketoprofen unless instructed by your care team. These medications  may hide a fever. What side effects may I notice from receiving this medication? Side effects that you should report to your care team as soon as possible: Allergic reactions--skin rash, itching, hives, swelling of the face, lips, tongue, or throat Dry cough, shortness of breath or trouble breathing Infection--fever, chills, cough, sore throat, wounds that don't heal, pain or trouble when passing urine, general feeling of discomfort or being unwell Heart failure--shortness of breath, swelling of the ankles, feet, or hands, sudden weight gain, unusual weakness or fatigue Unusual bruising or bleeding Side effects that usually do not require medical attention (report these to your care team if they continue or are  bothersome): Constipation Diarrhea Hair loss Muscle pain Nausea Vomiting This list may not describe all possible side effects. Call your doctor for medical advice about side effects. You may report side effects to FDA at 1-800-FDA-1088. Where should I keep my medication? This medication is given in a hospital or clinic. It will not be stored at home. NOTE: This sheet is a summary. It may not cover all possible information. If you have questions about this medicine, talk to your doctor, pharmacist, or health care provider.  2023 Elsevier/Gold Standard (2021-09-06 00:00:00)

## 2022-06-21 NOTE — Research (Signed)
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 Verified all follow-up information with patient along with confirmation of any positive COVID tests. Patient did have a positive COVID test on 30 May 2021. Patient reported she did well and received treatment. No adverse effects or side effects to report. Thanked patient for her time and continued participation in the above mentioned study.   Carol Ada, RT(R)(T) Clinical Research Coordinator

## 2022-06-26 ENCOUNTER — Other Ambulatory Visit: Payer: Self-pay

## 2022-07-03 ENCOUNTER — Inpatient Hospital Stay (HOSPITAL_BASED_OUTPATIENT_CLINIC_OR_DEPARTMENT_OTHER): Payer: Medicaid Other | Admitting: Adult Health

## 2022-07-03 ENCOUNTER — Ambulatory Visit (HOSPITAL_COMMUNITY)
Admission: RE | Admit: 2022-07-03 | Discharge: 2022-07-03 | Disposition: A | Discharge status: Home / Self Care | Payer: Medicaid Other | Source: Ambulatory Visit | Attending: Adult Health | Admitting: Adult Health

## 2022-07-03 ENCOUNTER — Encounter: Payer: Self-pay | Admitting: Hematology and Oncology

## 2022-07-03 ENCOUNTER — Other Ambulatory Visit: Payer: Self-pay

## 2022-07-03 ENCOUNTER — Inpatient Hospital Stay: Payer: Medicaid Other

## 2022-07-03 ENCOUNTER — Telehealth: Payer: Self-pay

## 2022-07-03 ENCOUNTER — Other Ambulatory Visit (HOSPITAL_COMMUNITY): Payer: Self-pay

## 2022-07-03 VITALS — BP 112/90 | HR 74 | Temp 97.7°F | Resp 16 | Ht 63.0 in | Wt 209.6 lb

## 2022-07-03 DIAGNOSIS — Z170 Estrogen receptor positive status [ER+]: Secondary | ICD-10-CM | POA: Insufficient documentation

## 2022-07-03 DIAGNOSIS — C50911 Malignant neoplasm of unspecified site of right female breast: Secondary | ICD-10-CM

## 2022-07-03 DIAGNOSIS — C50919 Malignant neoplasm of unspecified site of unspecified female breast: Secondary | ICD-10-CM

## 2022-07-03 DIAGNOSIS — C50411 Malignant neoplasm of upper-outer quadrant of right female breast: Secondary | ICD-10-CM

## 2022-07-03 DIAGNOSIS — R059 Cough, unspecified: Secondary | ICD-10-CM | POA: Diagnosis not present

## 2022-07-03 DIAGNOSIS — Z5112 Encounter for antineoplastic immunotherapy: Secondary | ICD-10-CM | POA: Diagnosis not present

## 2022-07-03 LAB — CBC WITH DIFFERENTIAL (CANCER CENTER ONLY)
Abs Immature Granulocytes: 0 10*3/uL (ref 0.00–0.07)
Basophils Absolute: 0 10*3/uL (ref 0.0–0.1)
Basophils Relative: 1 %
Eosinophils Absolute: 0.2 10*3/uL (ref 0.0–0.5)
Eosinophils Relative: 16 %
HCT: 31.7 % — ABNORMAL LOW (ref 36.0–46.0)
Hemoglobin: 11.4 g/dL — ABNORMAL LOW (ref 12.0–15.0)
Immature Granulocytes: 0 %
Lymphocytes Relative: 46 %
Lymphs Abs: 0.6 10*3/uL — ABNORMAL LOW (ref 0.7–4.0)
MCH: 37.1 pg — ABNORMAL HIGH (ref 26.0–34.0)
MCHC: 36 g/dL (ref 30.0–36.0)
MCV: 103.3 fL — ABNORMAL HIGH (ref 80.0–100.0)
Monocytes Absolute: 0.2 10*3/uL (ref 0.1–1.0)
Monocytes Relative: 13 %
Neutro Abs: 0.3 10*3/uL — CL (ref 1.7–7.7)
Neutrophils Relative %: 24 %
Platelet Count: 191 10*3/uL (ref 150–400)
RBC: 3.07 MIL/uL — ABNORMAL LOW (ref 3.87–5.11)
RDW: 14 % (ref 11.5–15.5)
WBC Count: 1.3 10*3/uL — ABNORMAL LOW (ref 4.0–10.5)
nRBC: 1.5 % — ABNORMAL HIGH (ref 0.0–0.2)

## 2022-07-03 LAB — MAGNESIUM: Magnesium: 1.7 mg/dL (ref 1.7–2.4)

## 2022-07-03 LAB — CMP (CANCER CENTER ONLY)
ALT: 20 U/L (ref 0–44)
AST: 26 U/L (ref 15–41)
Albumin: 3.5 g/dL (ref 3.5–5.0)
Alkaline Phosphatase: 74 U/L (ref 38–126)
Anion gap: 5 (ref 5–15)
BUN: 16 mg/dL (ref 6–20)
CO2: 29 mmol/L (ref 22–32)
Calcium: 9 mg/dL (ref 8.9–10.3)
Chloride: 106 mmol/L (ref 98–111)
Creatinine: 0.88 mg/dL (ref 0.44–1.00)
GFR, Estimated: >60 mL/min (ref >60)
Glucose, Bld: 104 mg/dL — ABNORMAL HIGH (ref 70–99)
Potassium: 3.4 mmol/L — ABNORMAL LOW (ref 3.5–5.1)
Sodium: 140 mmol/L (ref 135–145)
Total Bilirubin: 0.3 mg/dL (ref 0.3–1.2)
Total Protein: 6.6 g/dL (ref 6.5–8.1)

## 2022-07-03 MED ORDER — METHYLPREDNISOLONE 4 MG PO TABS
ORAL_TABLET | ORAL | 0 refills | Status: DC
  Filled 2022-07-03: qty 21, 6d supply, fill #0

## 2022-07-03 MED ORDER — AZITHROMYCIN 250 MG PO TABS
ORAL_TABLET | ORAL | 0 refills | Status: DC
  Filled 2022-07-03: qty 6, 5d supply, fill #0

## 2022-07-03 NOTE — Telephone Encounter (Signed)
Pt called and LVM with symptoms of cough, sore throat, and headache.  Pt first treatment of Enhertu was 7/19 and is concerned with symptoms regarding treatment.   Per Wilber Bihari, NP- Scheduled pt to be seen today with labs and f/u regarding symptoms. Pt verbalized understanding.
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
## 2022-07-05 NOTE — Progress Notes (Signed)
Patient Care Team: [name], MD as PCP - General (Internal Medicine) [name], MD as Attending Physician (Radiation Oncology) [name], [name], NP as Nurse Practitioner (Hematology and Oncology) [name], MD as Consulting Physician (General Surgery) [name], DPM as Consulting Physician (Podiatry) [name], MD as Consulting Physician (Gastroenterology) [name], MD as Consulting Physician (Hematology and Oncology)  DIAGNOSIS:  Encounter Diagnosis  Name Primary?   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 Right axilla core biopsy: carcinoma, grade 3, ER+(100%), PR+(90%), Ki67 25%, HER-2 negative (ratio 1.13).    07/17/2016 - 11/06/2016 Neo-Adjuvant Chemotherapy   Received 2 cycles of Doxorubicin and Cyclophosphamide, then transitioned to weekly Paclitaxel (due to repeated febrile neutropenia) x 7 cycles, stopped early due to peripheral neuropathy, then completed her final 2 cycles of Doxorubicin and Cyclophosphamide.    12/19/2016 Surgery   Right breast lumpectomy ([facility]): IDC, grade 2, 1.6cm and 0.3cm, margins negative, 3 SLN positive for metastatic carcinoma.     12/26/2016 Surgery    ALND: metastatic carcinoma in one of 20 lymph nodes, and three nodes from previous lumpectomy.  Four positive nodes, consistent with pN2a.   02/07/2017 - 03/21/2017 Radiation Therapy   Adjuvant radiation Isidore Moos): 1) Right breast and nodes - 4 field: 50 Gy in 25 fractions. IM NODES: >95% receive at least 45Gy/71f. 50Gy to SCLV/PAB @ 2Gy /fraction x 25 fractions. 2) Right breast boost: 10 Gy in 5 fractions   04/2017 -  Anti-estrogen oral therapy   Anastrozole 145mdaily.  Bone density 03/23/2017 finds T score of -2.6, osteoporosis, plan to start Prolia following dental clearance Anastrozole stopped 01/16/18 Exemestane 25 mg daily 02/12/18  On [name], trial randomized to endocrine therapy alone   05/27/2020 Progression   chest CT scan 05/27/2020 shows bulky mediastinal and right hilar lymphadenopathy with right pleural nodules and a small right pleural effusion, no evidence of liver or bone involvement             (a) biopsy of right breast mass 06/02/2020 shows invasive ductal carcinoma, estrogen and progesterone receptor positive, HER-2 not amplified, with an MIB-1 of 40%   06/17/2020 Treatment Plan Change    fulvestrant to start 06/17/2020             (a) palbociclib to start 06/17/2020 at 125 mg daily 21 days on 7 days off             (b) palbociclib decreased to 12537mvery other day x 11 doses on 07/23/2020             (c) palbociclib dose decreased to100 mg daily, 21/7, starting with September cycle             (d) Palbociclib decreased to 1m74mily beginning with March cycle due to oral  ulcers             (e) palbociclib discontinued November 2022 with progression   09/14/2021 PET scan   PET scan 09/14/2021 shows progression in liver and bone   10/05/2021 Pathology Results   liver biopsy 10/05/2021 confirms metastatic carcinoma, estrogen receptor strongly positive, progesterone receptor and HER2 negative, with an MIB-1 of 30%.   10/2021 Treatment Plan Change   fulvestrant continued, Xgeva  added, palbociclib changed to abemaciclib November 2022 -Dose Decreased to 22m PO BID Daily   03/07/2022 Miscellaneous   [facility] molecular testing: ER positive, ER positive, MTAP: Detected, ESR 1 negative, BRCA 1 and 2 negative, MSI stable, PD-L1 negative, PIK 3 CA negative, PR negative,   06/21/2022 -  Chemotherapy   Patient is on Treatment Plan : BREAST METASTATIC fam-trastuzumab deruxtecan-nxki (Enhertu) q21d     Breast cancer, stage 4 ([facility]  03/09/2022 Initial Diagnosis   [place], [place] 4 [place]   06/21/2022 -  Chemotherapy   Patient is on Treatment Plan : BREAST METASTATIC fam-trastuzumab deruxtecan-nxki (Enhertu) q21d       CHIEF COMPLIANT: Follow-up metastatic breast cancer on Enhertu  INTERVAL HISTORY: DMILITZA DEVERYis a 61y.o. with above-mentioned history of ER+ breast cancer. She presents to the clinic today for a follow-up.  After first cycle of treatment she felt significant cough and congestion and came to the symptom management clinic where she was found to have some infiltrates on chest x-ray but these infiltrates were present even before then.  She was given prednisone and antibiotics and was discharged home.  She appears to have recovered fairly well from that.  She also complained of nausea that was persistent throughout the day for the 2 weeks after chemo.   ALLERGIES:  is allergic to tape, codeine, cymbalta [duloxetine hcl], hydrocodone, ultram [tramadol hcl], venlafaxine, and gabapentin.  MEDICATIONS:  Current Outpatient Medications  Medication Sig Dispense Refill   azithromycin (ZITHROMAX) 250 MG tablet Take 2 tablets on day 1, then take 1 tablet daily on days 2 -5 6 each 0   Denosumab (XGEVA Elias-Fela Solis) Inject 120 mg into the skin every 3 (three) months.     famotidine (PEPCID) 20 MG tablet Take 20 mg by mouth daily as needed for heartburn or indigestion.     lidocaine-prilocaine (EMLA) cream Apply to affected area once 30 g 3   loratadine (CLARITIN) 10 MG tablet Take  10 mg by mouth daily.     methylPREDNISolone (MEDROL) 4 MG tablet Take 6 tabs on day 1, then 5 tabs on day 2, then 4 tabs on day 3, then 3 tabs on day 4, 2 tabs on day 5 and 1 tab on day 6 21 tablet 0   naproxen sodium (ALEVE) 220 MG tablet Take 440-660 mg by mouth 2 (two) times daily as needed (pain).     omeprazole (PRILOSEC) 20 MG capsule Take 20 mg by mouth daily.     ondansetron (ZOFRAN) 8 MG tablet Take 1 tablet (8 mg total) by mouth 2 (two) times daily as needed for refractory nausea / vomiting. Start on day 3 after chemo. 30 tablet 1   prochlorperazine (COMPAZINE) 10 MG tablet Take 1 tablet (10 mg total) by mouth every 6 (six) hours as needed for nausea or vomiting. 30 tablet 1   rizatriptan (MAXALT) 5 MG tablet Take 1 tablet (5 mg total) by mouth as needed for migraine. May repeat in 2 hours if needed 10 tablet 0   simethicone (MYLICON) 1975  MG chewable tablet Chew 125 mg by mouth every 6 (six) hours as needed for flatulence.     valACYclovir (VALTREX) 1000 MG tablet Take 1 tablet (1,000 mg total) by mouth 2 (two) times daily. 1/2 tab BID (Patient taking differently: Take 500 mg by mouth See admin instructions. Take 500 mg daily, may take a second 500 mg dose as needed for outbreaks) 180 tablet 4   No current facility-administered medications for this visit.    PHYSICAL EXAMINATION: ECOG PERFORMANCE STATUS: 1 - Symptomatic but completely ambulatory  Vitals:   07/13/22 1427  BP: (!) 125/95  Pulse: 87  Resp: 18  Temp: (!) 97.3 F (36.3 C)  SpO2: 98%   Filed Weights   07/13/22 1427  Weight: 209 lb 14.4 oz (95.2 kg)      LABORATORY DATA:  I have reviewed the data as listed    Latest Ref Rng & Units 07/03/2022    1:57 PM 06/20/2022    2:03 PM 05/30/2022    2:26 PM  CMP  Glucose 70 - 99 mg/dL 104  98  100   BUN 6 - 20 mg/dL _0 Creatinine 0.44 - 1.00 mg/dL 0.88  0.76  0.93   Sodium 135 - 145 mmol/L 140  137  136   Potassium 3.5 - 5.1 mmol/L 3.4  3.9  4.3   Chloride  98 - 111 mmol/L 106  106  106   CO2 22 - 32 mmol/L _1 Calcium 8.9 - 10.3 mg/dL 9.0  9.0  9.2   Total Protein 6.5 - 8.1 g/dL 6.6  6.9  6.7   Total Bilirubin 0.3 - 1.2 mg/dL 0.3  0.5  0.5   Alkaline Phos 38 - 126 U/L 74  77  74   AST 15 - 41 U/L 26  27  46   ALT 0 - 44 U/L 20  21  38     Lab Results  Component Value Date   WBC 4.7 07/13/2022   HGB 11.6 (L) 07/13/2022   HCT 33.0 (L) 07/13/2022   MCV 104.4 (H) 07/13/2022   PLT 292 07/13/2022   NEUTROABS 2.9 07/13/2022    ASSESSMENT & PLAN:  Malignant neoplasm of upper-outer quadrant of right breast in female, estrogen receptor positive ([place]) 1.  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  On Verzinio to 100 mg bid   2. bone metastasis: Currently on Xgeva every 3 months.   3.  Chemotherapy-induced peripheral neuropathy.  This is improving and now intermittent only noticeable in her toes.  She is not having any pain or balance issues related to this.   CT CAP 02/01/2022: Mild progression of disease with increased number and size of the numerous lung nodules in the right lung.  Persistent lymphadenopathy and right hilar and right paratracheal nodal areas, numerous hypovascular hepatic lesions slightly larger when compared to previous.   CA 27-29: 226 on 02/01/2022 (gradually progressing)   Plan: [facility] molecular testing: ER positive, ER positive, TMB 5, PD-L1 negative, PIK 3 CA negative  -------------------------------------------------------- Current treatment: Enhertu cycle 2 Echocardiogram: 06/14/2022: EF 55 to 60%  Chemo toxicities: 1.  Cough and congestion: Responded to prednisone and antibiotics.  Chest x-ray showed some infiltrates which were seen even before starting treatment.  Therefore she does not have interstitial lung disease.  The symptoms have resolved. 2. Nausea: 3.  Leukopenia: I will  reduce the dosage of her treatment. 4.  Alopecia: Patient  is losing significant amount of hair  Return to clinic in 3 weeks for cycle 3    Orders Placed This Encounter  Procedures   CA 27.29    Standing Status:   Standing    Number of Occurrences:   20    Standing Expiration Date:   07/14/2023   The patient has a good understanding of the overall plan. she agrees with it. she will call with any problems that may develop before the next visit here. Total time spent: 30 mins including face to face time and time spent for planning, charting and co-ordination of care   Harriette Ohara, MD 07/13/22    I Gardiner Coins am scribing for Dr. Lindi Adie  I have reviewed the above documentation for accuracy and completeness, and I agree with the above.

## 2022-07-06 ENCOUNTER — Encounter: Payer: Self-pay | Admitting: Hematology and Oncology

## 2022-07-06 NOTE — Assessment & Plan Note (Signed)
April [name] is a 61 year old woman with metastatic breast cancer here today for follow-up about a week after receiving Enhertu for cough, sore throat, and tiredness.  Considering her constellation of symptoms we will do the following: 1.  Chest x-ray 2.  Azithromycin 3.  Medrol Dosepak  Should the situation continue we may need to evaluate further with CT chest.    We discussed neutropenic precautions in detail.  When she sees Dr. Lindi Adie next he may need to dose reduce her Enhertu.

## 2022-07-07 NOTE — Progress Notes (Signed)
..  Patient is receiving Replacement Medication. Medication: Delton See Manufacture: [employer] Approval Dates: Approved from 07/06/2022 until 07/06/2023. ID: [id number] Reason: Self Pay First DOS: 08/24/2022

## 2022-07-09 ENCOUNTER — Other Ambulatory Visit: Payer: Self-pay

## 2022-07-12 MED FILL — Dexamethasone Sodium Phosphate Inj 100 MG/10ML: INTRAMUSCULAR | Qty: 1 | Status: AC

## 2022-07-13 ENCOUNTER — Inpatient Hospital Stay: Payer: Medicaid Other

## 2022-07-13 ENCOUNTER — Other Ambulatory Visit: Payer: Self-pay

## 2022-07-13 ENCOUNTER — Other Ambulatory Visit: Payer: Self-pay | Admitting: *Deleted

## 2022-07-13 ENCOUNTER — Inpatient Hospital Stay: Payer: Medicaid Other | Attending: Oncology

## 2022-07-13 ENCOUNTER — Inpatient Hospital Stay (HOSPITAL_BASED_OUTPATIENT_CLINIC_OR_DEPARTMENT_OTHER): Payer: Medicaid Other | Admitting: Hematology and Oncology

## 2022-07-13 DIAGNOSIS — Z9221 Personal history of antineoplastic chemotherapy: Secondary | ICD-10-CM | POA: Diagnosis not present

## 2022-07-13 DIAGNOSIS — C50911 Malignant neoplasm of unspecified site of right female breast: Secondary | ICD-10-CM

## 2022-07-13 DIAGNOSIS — T451X5A Adverse effect of antineoplastic and immunosuppressive drugs, initial encounter: Secondary | ICD-10-CM | POA: Insufficient documentation

## 2022-07-13 DIAGNOSIS — C7951 Secondary malignant neoplasm of bone: Secondary | ICD-10-CM | POA: Diagnosis not present

## 2022-07-13 DIAGNOSIS — R110 Nausea: Secondary | ICD-10-CM | POA: Insufficient documentation

## 2022-07-13 DIAGNOSIS — Z95828 Presence of other vascular implants and grafts: Secondary | ICD-10-CM

## 2022-07-13 DIAGNOSIS — G620 Drug-induced polyneuropathy: Secondary | ICD-10-CM | POA: Diagnosis not present

## 2022-07-13 DIAGNOSIS — G629 Polyneuropathy, unspecified: Secondary | ICD-10-CM | POA: Diagnosis not present

## 2022-07-13 DIAGNOSIS — C50919 Malignant neoplasm of unspecified site of unspecified female breast: Secondary | ICD-10-CM

## 2022-07-13 DIAGNOSIS — C50411 Malignant neoplasm of upper-outer quadrant of right female breast: Secondary | ICD-10-CM | POA: Diagnosis not present

## 2022-07-13 DIAGNOSIS — C773 Secondary and unspecified malignant neoplasm of axilla and upper limb lymph nodes: Secondary | ICD-10-CM | POA: Insufficient documentation

## 2022-07-13 DIAGNOSIS — Z5112 Encounter for antineoplastic immunotherapy: Secondary | ICD-10-CM | POA: Diagnosis not present

## 2022-07-13 DIAGNOSIS — D72819 Decreased white blood cell count, unspecified: Secondary | ICD-10-CM | POA: Insufficient documentation

## 2022-07-13 DIAGNOSIS — Z79899 Other long term (current) drug therapy: Secondary | ICD-10-CM | POA: Insufficient documentation

## 2022-07-13 DIAGNOSIS — Z79624 Long term (current) use of inhibitors of nucleotide synthesis: Secondary | ICD-10-CM | POA: Insufficient documentation

## 2022-07-13 DIAGNOSIS — Z170 Estrogen receptor positive status [ER+]: Secondary | ICD-10-CM | POA: Insufficient documentation

## 2022-07-13 LAB — CBC WITH DIFFERENTIAL (CANCER CENTER ONLY)
Abs Immature Granulocytes: 0.03 10*3/uL (ref 0.00–0.07)
Basophils Absolute: 0 10*3/uL (ref 0.0–0.1)
Basophils Relative: 1 %
Eosinophils Absolute: 0.2 10*3/uL (ref 0.0–0.5)
Eosinophils Relative: 4 %
HCT: 33 % — ABNORMAL LOW (ref 36.0–46.0)
Hemoglobin: 11.6 g/dL — ABNORMAL LOW (ref 12.0–15.0)
Immature Granulocytes: 1 %
Lymphocytes Relative: 19 %
Lymphs Abs: 0.9 10*3/uL (ref 0.7–4.0)
MCH: 36.7 pg — ABNORMAL HIGH (ref 26.0–34.0)
MCHC: 35.2 g/dL (ref 30.0–36.0)
MCV: 104.4 fL — ABNORMAL HIGH (ref 80.0–100.0)
Monocytes Absolute: 0.7 10*3/uL (ref 0.1–1.0)
Monocytes Relative: 14 %
Neutro Abs: 2.9 10*3/uL (ref 1.7–7.7)
Neutrophils Relative %: 61 %
Platelet Count: 292 10*3/uL (ref 150–400)
RBC: 3.16 MIL/uL — ABNORMAL LOW (ref 3.87–5.11)
RDW: 15.5 % (ref 11.5–15.5)
WBC Count: 4.7 10*3/uL (ref 4.0–10.5)
nRBC: 0 % (ref 0.0–0.2)

## 2022-07-13 LAB — CMP (CANCER CENTER ONLY)
ALT: 26 U/L (ref 0–44)
AST: 30 U/L (ref 15–41)
Albumin: 3.5 g/dL (ref 3.5–5.0)
Alkaline Phosphatase: 72 U/L (ref 38–126)
Anion gap: 5 (ref 5–15)
BUN: 12 mg/dL (ref 6–20)
CO2: 28 mmol/L (ref 22–32)
Calcium: 9.2 mg/dL (ref 8.9–10.3)
Chloride: 105 mmol/L (ref 98–111)
Creatinine: 0.67 mg/dL (ref 0.44–1.00)
GFR, Estimated: >60 mL/min (ref >60)
Glucose, Bld: 89 mg/dL (ref 70–99)
Potassium: 3.9 mmol/L (ref 3.5–5.1)
Sodium: 138 mmol/L (ref 135–145)
Total Bilirubin: 0.3 mg/dL (ref 0.3–1.2)
Total Protein: 6.4 g/dL — ABNORMAL LOW (ref 6.5–8.1)

## 2022-07-13 MED ORDER — SODIUM CHLORIDE 0.9% FLUSH
10.0000 mL | INTRAVENOUS | Status: AC | PRN
  Administered 2022-07-13: 10 mL

## 2022-07-13 MED ORDER — DIPHENHYDRAMINE HCL 25 MG PO CAPS
50.0000 mg | ORAL_CAPSULE | Freq: Once | ORAL | Status: AC
  Administered 2022-07-13: 50 mg via ORAL

## 2022-07-13 MED ORDER — ACETAMINOPHEN 325 MG PO TABS
ORAL_TABLET | ORAL | Status: AC
  Filled 2022-07-13: qty 2

## 2022-07-13 MED ORDER — ACETAMINOPHEN 325 MG PO TABS
650.0000 mg | ORAL_TABLET | Freq: Once | ORAL | Status: AC
  Administered 2022-07-13: 650 mg via ORAL

## 2022-07-13 MED ORDER — PALONOSETRON HCL INJECTION 0.25 MG/5ML
INTRAVENOUS | Status: AC
  Filled 2022-07-13: qty 5

## 2022-07-13 MED ORDER — DIPHENHYDRAMINE HCL 25 MG PO CAPS
ORAL_CAPSULE | ORAL | Status: AC
  Filled 2022-07-13: qty 2

## 2022-07-13 MED ORDER — HEPARIN SOD (PORK) LOCK FLUSH 100 UNIT/ML IV SOLN
500.0000 [IU] | Freq: Once | INTRAVENOUS | Status: AC | PRN
  Administered 2022-07-13: 500 [IU]

## 2022-07-13 MED ORDER — SODIUM CHLORIDE 0.9% FLUSH
10.0000 mL | INTRAVENOUS | Status: DC | PRN
  Administered 2022-07-13: 10 mL

## 2022-07-13 MED ORDER — PALONOSETRON HCL INJECTION 0.25 MG/5ML
0.2500 mg | Freq: Once | INTRAVENOUS | Status: AC
  Administered 2022-07-13: 0.25 mg via INTRAVENOUS

## 2022-07-13 MED ORDER — FAM-TRASTUZUMAB DERUXTECAN-NXKI CHEMO 100 MG IV SOLR
4.3000 mg/kg | Freq: Once | INTRAVENOUS | Status: AC
  Administered 2022-07-13: 400 mg via INTRAVENOUS
  Filled 2022-07-13: qty 20

## 2022-07-13 MED ORDER — DEXTROSE 5 % IV SOLN: Freq: Once | INTRAVENOUS | Status: AC

## 2022-07-13 MED ORDER — SODIUM CHLORIDE 0.9 % IV SOLN
10.0000 mg | Freq: Once | INTRAVENOUS | Status: AC
  Administered 2022-07-13: 10 mg via INTRAVENOUS
  Filled 2022-07-13: qty 10

## 2022-07-13 NOTE — Assessment & Plan Note (Addendum)
1. 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 OnVerzinio to 100 mg bid  2.bone metastasis:Currently onXgeva every 3 months.  3. Chemotherapy-induced peripheral neuropathy. This is improving and now intermittent only noticeable in her toes. She is not having any pain or balance issues related to this.  CT CAP 02/01/2022: Mild progression of disease with increased number and size of the numerous lung nodules in the right lung. Persistent lymphadenopathy and right hilar and right paratracheal nodal areas, numerous hypovascular hepatic lesions slightly larger when compared to previous.  CA 27-29: 226 on 02/01/2022 (gradually progressing)  Plan: [facility] molecular testing: ER positive, ER positive, TMB 5, PD-L1 negative, PIK 3 CA negative  -------------------------------------------------------- Current treatment: Enhertu cycle 2 Echocardiogram: 06/14/2022: EF 55 to 60%  Chemo toxicities: 1.  Cough and congestion: Responded to prednisone and antibiotics.  Chest x-ray showed some infiltrates which were seen even before starting treatment.  Therefore she does not have interstitial lung disease.  The symptoms have resolved. 2. Nausea: 3.  Leukopenia: I will reduce the dosage of her treatment. 4.  Alopecia: Patient is losing significant amount of hair  Return to clinic in 3 weeks for cycle 3

## 2022-07-14 LAB — CANCER ANTIGEN 27.29: CA 27.29: 584.9 U/mL — ABNORMAL HIGH (ref 0.0–38.6)

## 2022-08-02 MED FILL — Dexamethasone Sodium Phosphate Inj 100 MG/10ML: INTRAMUSCULAR | Qty: 1 | Status: AC

## 2022-08-02 NOTE — Progress Notes (Signed)
Patient Care Team: [name], MD as PCP - General (Internal Medicine) [name], MD as Attending Physician (Radiation Oncology) [name], [name], NP as Nurse Practitioner (Hematology and Oncology) [name], MD as Consulting Physician (General Surgery) [name], DPM as Consulting Physician (Podiatry) [name], MD as Consulting Physician (Gastroenterology) [name], MD as Consulting Physician (Hematology and Oncology)  DIAGNOSIS: No diagnosis found.  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[place])  06/21/2016 Initial Diagnosis   Breast cancer metastasized to axillary lymph node ([place])   06/21/2022 -  Chemotherapy   Patient is on Treatment Plan : BREAST METASTATIC fam-trastuzumab deruxtecan-nxki (Enhertu) q21d     Malignant neoplasm of upper-outer quadrant of right breast in female, estrogen receptor positive ([place])  06/21/2016 Initial Diagnosis   Malignant neoplasm of upper-outer quadrant of right breast in female, estrogen receptor positive ([place])   06/21/2016 Initial Biopsy   Right breast biopsy, 10 oclock: IDC, grade 3, ER+(95%), PR+(80%),Ki67 20%, HER-2 negative (ratio 0.67). Right axilla core biopsy: carcinoma, grade 3, ER+(100%), PR+(90%), Ki67 25%, HER-2 negative (ratio 1.13).    07/17/2016 - 11/06/2016 Neo-Adjuvant Chemotherapy   Received 2 cycles of Doxorubicin and Cyclophosphamide, then transitioned to weekly Paclitaxel (due to repeated febrile neutropenia) x 7 cycles, stopped early due to peripheral neuropathy, then completed her final 2 cycles of Doxorubicin and Cyclophosphamide.    12/19/2016 Surgery   Right breast lumpectomy ([facility]): IDC, grade 2, 1.6cm and 0.3cm, margins negative, 3 SLN positive for metastatic carcinoma.     12/26/2016 Surgery   ALND: metastatic carcinoma in one of 20 lymph nodes, and three nodes from previous lumpectomy.  Four positive nodes, consistent  with pN2a.   02/07/2017 - 03/21/2017 Radiation Therapy   Adjuvant radiation Isidore Moos): 1) Right breast and nodes - 4 field: 50 Gy in 25 fractions. IM NODES: >95% receive at least 45Gy/35fx. 50Gy to SCLV/PAB @ 2Gy /fraction x 25 fractions. 2) Right breast boost: 10 Gy in 5 fractions   04/2017 -  Anti-estrogen oral therapy   Anastrozole $RemoveBefo'1mg'FVZDpbXzaRp$  daily.  Bone density 03/23/2017 finds T score of -2.6, osteoporosis, plan to start Prolia following dental clearance Anastrozole stopped 01/16/18 Exemestane 25 mg daily 02/12/18  On [name], trial randomized to endocrine therapy alone   05/27/2020 Progression   chest CT scan 05/27/2020 shows bulky mediastinal and right hilar lymphadenopathy with right pleural nodules and a small right pleural effusion, no evidence of liver or bone involvement             (a) biopsy of right breast mass 06/02/2020 shows invasive ductal carcinoma, estrogen and progesterone receptor positive, HER-2 not amplified, with an MIB-1 of 40%   06/17/2020 Treatment Plan Change    fulvestrant to start 06/17/2020             (a) palbociclib to start 06/17/2020 at 125 mg daily 21 days on 7 days off             (b) palbociclib decreased to $RemoveBefo'125mg'WYIseTFwLjE$  every other day x 11 doses on 07/23/2020             (c) palbociclib dose decreased to100 mg daily, 21/7, starting with September cycle             (d) Palbociclib decreased to $RemoveBefo'75mg'jGdpehFKHmr$  daily beginning with March cycle due to oral ulcers             (e) palbociclib discontinued November 2022 with progression  09/14/2021 PET scan   PET scan 09/14/2021 shows progression in liver and bone   10/05/2021 Pathology Results   liver biopsy 10/05/2021 confirms metastatic carcinoma, estrogen receptor strongly positive, progesterone receptor and HER2 negative, with an MIB-1 of 30%.   10/2021 Treatment Plan Change   fulvestrant continued, Xgeva added, palbociclib changed to abemaciclib November 2022 -Dose Decreased to $RemoveBefo'50mg'cJVNatIGvHZ$  PO BID Daily   03/07/2022 Miscellaneous    [facility] molecular testing: ER positive, ER positive, MTAP: Detected, ESR 1 negative, BRCA 1 and 2 negative, MSI stable, PD-L1 negative, PIK 3 CA negative, PR negative,   06/21/2022 -  Chemotherapy   Patient is on Treatment Plan : BREAST METASTATIC fam-trastuzumab deruxtecan-nxki (Enhertu) q21d     Breast cancer, stage 4 ([place])  03/09/2022 Initial Diagnosis   Breast cancer, stage 4 ([place])   06/21/2022 -  Chemotherapy   Patient is on Treatment Plan : BREAST METASTATIC fam-trastuzumab deruxtecan-nxki (Enhertu) q21d       CHIEF COMPLIANT:  Follow-up metastatic breast cancer on Enhertu cycle 3  INTERVAL HISTORY: April [name] is a 61 y.o. with above-mentioned history of ER+ breast cancer. She presents to the clinic today for a follow-up.    ALLERGIES:  is allergic to tape, codeine, cymbalta [duloxetine hcl], hydrocodone, ultram [tramadol hcl], venlafaxine, and gabapentin.  MEDICATIONS:  Current Outpatient Medications  Medication Sig Dispense Refill   azithromycin (ZITHROMAX) 250 MG tablet Take 2 tablets on day 1, then take 1 tablet daily on days 2 -5 6 each 0   Denosumab (XGEVA [place]) Inject 120 mg into the skin every 3 (three) months.     famotidine (PEPCID) 20 MG tablet Take 20 mg by mouth daily as needed for heartburn or indigestion.     lidocaine-prilocaine (EMLA) cream Apply to affected area once 30 g 3   loratadine (CLARITIN) 10 MG tablet Take 10 mg by mouth daily.     methylPREDNISolone (MEDROL) 4 MG tablet Take 6 tabs on day 1, then 5 tabs on day 2, then 4 tabs on day 3, then 3 tabs on day 4, 2 tabs on day 5 and 1 tab on day 6 21 tablet 0   naproxen sodium (ALEVE) 220 MG tablet Take 440-660 mg by mouth 2 (two) times daily as needed (pain).     omeprazole (PRILOSEC) 20 MG capsule Take 20 mg by mouth daily.     ondansetron (ZOFRAN) 8 MG tablet Take 1 tablet (8 mg total) by mouth 2 (two) times daily as needed for refractory nausea / vomiting. Start on day 3 after chemo. 30 tablet 1    prochlorperazine (COMPAZINE) 10 MG tablet Take 1 tablet (10 mg total) by mouth every 6 (six) hours as needed for nausea or vomiting. 30 tablet 1   rizatriptan (MAXALT) 5 MG tablet Take 1 tablet (5 mg total) by mouth as needed for migraine. May repeat in 2 hours if needed 10 tablet 0   simethicone (MYLICON) 876 MG chewable tablet Chew 125 mg by mouth every 6 (six) hours as needed for flatulence.     valACYclovir (VALTREX) 1000 MG tablet Take 1 tablet (1,000 mg total) by mouth 2 (two) times daily. 1/2 tab BID (Patient taking differently: Take 500 mg by mouth See admin instructions. Take 500 mg daily, may take a second 500 mg dose as needed for outbreaks) 180 tablet 4   No current facility-administered medications for this visit.    PHYSICAL EXAMINATION: ECOG PERFORMANCE STATUS: {CHL ONC ECOG OT:[id number]}  There were no vitals  filed for this visit. There were no vitals filed for this visit.  BREAST:*** No palpable masses or nodules in either right or left breasts. No palpable axillary supraclavicular or infraclavicular adenopathy no breast tenderness or nipple discharge. (exam performed in the presence of a chaperone)  LABORATORY DATA:  I have reviewed the data as listed    Latest Ref Rng & Units 07/13/2022    1:59 PM 07/03/2022    1:57 PM 06/20/2022    2:03 PM  CMP  Glucose 70 - 99 mg/dL 89  104  98   BUN 6 - 20 mg/dL $Remove'12  16  12   'zoAGwXA$ Creatinine 0.44 - 1.00 mg/dL 0.67  0.88  0.76   Sodium 135 - 145 mmol/L 138  140  137   Potassium 3.5 - 5.1 mmol/L 3.9  3.4  3.9   Chloride 98 - 111 mmol/L 105  106  106   CO2 22 - 32 mmol/L $RemoveB'28  29  23   'IDpZMWCf$ Calcium 8.9 - 10.3 mg/dL 9.2  9.0  9.0   Total Protein 6.5 - 8.1 g/dL 6.4  6.6  6.9   Total Bilirubin 0.3 - 1.2 mg/dL 0.3  0.3  0.5   Alkaline Phos 38 - 126 U/L 72  74  77   AST 15 - 41 U/L $Remo'30  26  27   'CTAnk$ ALT 0 - 44 U/L $Remo'26  20  21     'XdBZo$ Lab Results  Component Value Date   WBC 4.7 07/13/2022   HGB 11.6 (L) 07/13/2022   HCT 33.0 (L) 07/13/2022   MCV 104.4  (H) 07/13/2022   PLT 292 07/13/2022   NEUTROABS 2.9 07/13/2022    ASSESSMENT & PLAN:  No problem-specific Assessment & Plan notes found for this encounter.    No orders of the defined types were placed in this encounter.  The patient has a good understanding of the overall plan. she agrees with it. she will call with any problems that may develop before the next visit here. Total time spent: 30 mins including face to face time and time spent for planning, charting and co-ordination of care   Suzzette Righter, [place] 08/02/22    I Gardiner Coins am scribing for Dr. Lindi Adie  ***

## 2022-08-03 ENCOUNTER — Inpatient Hospital Stay: Payer: Medicaid Other

## 2022-08-03 ENCOUNTER — Other Ambulatory Visit: Payer: Self-pay

## 2022-08-03 ENCOUNTER — Inpatient Hospital Stay (HOSPITAL_BASED_OUTPATIENT_CLINIC_OR_DEPARTMENT_OTHER): Payer: Medicaid Other | Admitting: Hematology and Oncology

## 2022-08-03 ENCOUNTER — Encounter: Payer: Self-pay | Admitting: Hematology and Oncology

## 2022-08-03 VITALS — BP 112/72 | HR 89 | Temp 97.3°F | Resp 18 | Ht 63.0 in | Wt 210.9 lb

## 2022-08-03 DIAGNOSIS — C50919 Malignant neoplasm of unspecified site of unspecified female breast: Secondary | ICD-10-CM

## 2022-08-03 DIAGNOSIS — C50911 Malignant neoplasm of unspecified site of right female breast: Secondary | ICD-10-CM

## 2022-08-03 DIAGNOSIS — C50411 Malignant neoplasm of upper-outer quadrant of right female breast: Secondary | ICD-10-CM

## 2022-08-03 DIAGNOSIS — Z170 Estrogen receptor positive status [ER+]: Secondary | ICD-10-CM

## 2022-08-03 DIAGNOSIS — C773 Secondary and unspecified malignant neoplasm of axilla and upper limb lymph nodes: Secondary | ICD-10-CM | POA: Diagnosis not present

## 2022-08-03 DIAGNOSIS — Z5112 Encounter for antineoplastic immunotherapy: Secondary | ICD-10-CM | POA: Diagnosis not present

## 2022-08-03 DIAGNOSIS — Z95828 Presence of other vascular implants and grafts: Secondary | ICD-10-CM

## 2022-08-03 LAB — CBC WITH DIFFERENTIAL (CANCER CENTER ONLY)
Abs Immature Granulocytes: 0 10*3/uL (ref 0.00–0.07)
Basophils Absolute: 0 10*3/uL (ref 0.0–0.1)
Basophils Relative: 1 %
Eosinophils Absolute: 0.4 10*3/uL (ref 0.0–0.5)
Eosinophils Relative: 12 %
HCT: 35.2 % — ABNORMAL LOW (ref 36.0–46.0)
Hemoglobin: 12.3 g/dL (ref 12.0–15.0)
Immature Granulocytes: 0 %
Lymphocytes Relative: 25 %
Lymphs Abs: 0.9 10*3/uL (ref 0.7–4.0)
MCH: 36.4 pg — ABNORMAL HIGH (ref 26.0–34.0)
MCHC: 34.9 g/dL (ref 30.0–36.0)
MCV: 104.1 fL — ABNORMAL HIGH (ref 80.0–100.0)
Monocytes Absolute: 0.4 10*3/uL (ref 0.1–1.0)
Monocytes Relative: 11 %
Neutro Abs: 2 10*3/uL (ref 1.7–7.7)
Neutrophils Relative %: 51 %
Platelet Count: 265 10*3/uL (ref 150–400)
RBC: 3.38 MIL/uL — ABNORMAL LOW (ref 3.87–5.11)
RDW: 15.4 % (ref 11.5–15.5)
WBC Count: 3.8 10*3/uL — ABNORMAL LOW (ref 4.0–10.5)
nRBC: 0 % (ref 0.0–0.2)

## 2022-08-03 LAB — CMP (CANCER CENTER ONLY)
ALT: 18 U/L (ref 0–44)
AST: 23 U/L (ref 15–41)
Albumin: 3.6 g/dL (ref 3.5–5.0)
Alkaline Phosphatase: 72 U/L (ref 38–126)
Anion gap: 4 — ABNORMAL LOW (ref 5–15)
BUN: 9 mg/dL (ref 6–20)
CO2: 25 mmol/L (ref 22–32)
Calcium: 8.9 mg/dL (ref 8.9–10.3)
Chloride: 112 mmol/L — ABNORMAL HIGH (ref 98–111)
Creatinine: 0.71 mg/dL (ref 0.44–1.00)
GFR, Estimated: >60 mL/min (ref >60)
Glucose, Bld: 122 mg/dL — ABNORMAL HIGH (ref 70–99)
Potassium: 3.5 mmol/L (ref 3.5–5.1)
Sodium: 141 mmol/L (ref 135–145)
Total Bilirubin: 0.3 mg/dL (ref 0.3–1.2)
Total Protein: 6.4 g/dL — ABNORMAL LOW (ref 6.5–8.1)

## 2022-08-03 MED ORDER — PALONOSETRON HCL INJECTION 0.25 MG/5ML
0.2500 mg | Freq: Once | INTRAVENOUS | Status: AC
  Administered 2022-08-03: 0.25 mg via INTRAVENOUS
  Filled 2022-08-03: qty 5

## 2022-08-03 MED ORDER — SODIUM CHLORIDE 0.9 % IV SOLN
10.0000 mg | Freq: Once | INTRAVENOUS | Status: AC
  Administered 2022-08-03: 10 mg via INTRAVENOUS
  Filled 2022-08-03: qty 10

## 2022-08-03 MED ORDER — DEXTROSE 5 % IV SOLN: Freq: Once | INTRAVENOUS | Status: AC

## 2022-08-03 MED ORDER — ACETAMINOPHEN 325 MG PO TABS
650.0000 mg | ORAL_TABLET | Freq: Once | ORAL | Status: AC
  Administered 2022-08-03: 650 mg via ORAL
  Filled 2022-08-03: qty 2

## 2022-08-03 MED ORDER — DIPHENHYDRAMINE HCL 25 MG PO CAPS
50.0000 mg | ORAL_CAPSULE | Freq: Once | ORAL | Status: AC
  Administered 2022-08-03: 50 mg via ORAL
  Filled 2022-08-03: qty 2

## 2022-08-03 MED ORDER — FAM-TRASTUZUMAB DERUXTECAN-NXKI CHEMO 100 MG IV SOLR
4.3000 mg/kg | Freq: Once | INTRAVENOUS | Status: AC
  Administered 2022-08-03: 400 mg via INTRAVENOUS
  Filled 2022-08-03: qty 20

## 2022-08-03 MED ORDER — SODIUM CHLORIDE 0.9% FLUSH
10.0000 mL | INTRAVENOUS | Status: AC | PRN
  Administered 2022-08-03: 10 mL

## 2022-08-03 NOTE — Assessment & Plan Note (Signed)
1. 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 OnVerzinio to 100 mg bid  2.bone metastasis:Currently onXgeva every 3 months.  3. Chemotherapy-induced peripheral neuropathy. This is improving and now intermittent only noticeable in her toes. She is not having any pain or balance issues related to this.  CT CAP 02/01/2022: Mild progression of disease with increased number and size of the numerous lung nodules in the right lung. Persistent lymphadenopathy and right hilar and right paratracheal nodal areas, numerous hypovascular hepatic lesions slightly larger when compared to previous.  CA 27-29: 226 on 02/01/2022 (gradually progressing)  Plan: [facility] molecular testing:ER positive, ER positive, TMB 5, PD-L1 negative, PIK 3 CA negative -------------------------------------------------------- Current treatment: Enhertu cycle 3 Echocardiogram: 06/14/2022: EF 55 to 60%  Chemo toxicities: 1.  Cough and congestion: Responded to prednisone and antibiotics.  Chest x-ray showed some infiltrates which were seen even before starting treatment.  Therefore she does not have interstitial lung disease.  The symptoms have resolved. 2. Nausea: 3.  Leukopenia: I will reduce the dosage of her treatment. 4.  Alopecia: Patient is losing significant amount of hair  Return to clinic in 3 weeks for cycle 4

## 2022-08-04 LAB — CANCER ANTIGEN 27.29: CA 27.29: 331.3 U/mL — ABNORMAL HIGH (ref 0.0–38.6)

## 2022-08-09 ENCOUNTER — Other Ambulatory Visit: Payer: Self-pay | Admitting: Hematology and Oncology

## 2022-08-09 ENCOUNTER — Telehealth: Payer: Self-pay | Admitting: Hematology and Oncology

## 2022-08-09 DIAGNOSIS — C50919 Malignant neoplasm of unspecified site of unspecified female breast: Secondary | ICD-10-CM

## 2022-08-09 DIAGNOSIS — C50911 Malignant neoplasm of unspecified site of right female breast: Secondary | ICD-10-CM

## 2022-08-09 DIAGNOSIS — C50411 Malignant neoplasm of upper-outer quadrant of right female breast: Secondary | ICD-10-CM

## 2022-08-09 NOTE — Telephone Encounter (Signed)
Scheduled appointments per WQ. Patient is aware. 

## 2022-08-10 ENCOUNTER — Other Ambulatory Visit: Payer: Self-pay

## 2022-08-15 ENCOUNTER — Other Ambulatory Visit: Payer: Self-pay

## 2022-08-22 ENCOUNTER — Ambulatory Visit (HOSPITAL_COMMUNITY)
Admission: RE | Admit: 2022-08-22 | Discharge: 2022-08-22 | Disposition: A | Discharge status: Home / Self Care | Payer: Medicaid Other | Source: Ambulatory Visit | Attending: Hematology and Oncology | Admitting: Hematology and Oncology

## 2022-08-22 ENCOUNTER — Encounter (HOSPITAL_COMMUNITY): Payer: Self-pay

## 2022-08-22 DIAGNOSIS — C773 Secondary and unspecified malignant neoplasm of axilla and upper limb lymph nodes: Secondary | ICD-10-CM | POA: Diagnosis present

## 2022-08-22 DIAGNOSIS — Z170 Estrogen receptor positive status [ER+]: Secondary | ICD-10-CM | POA: Insufficient documentation

## 2022-08-22 DIAGNOSIS — C50919 Malignant neoplasm of unspecified site of unspecified female breast: Secondary | ICD-10-CM | POA: Insufficient documentation

## 2022-08-22 DIAGNOSIS — C50411 Malignant neoplasm of upper-outer quadrant of right female breast: Secondary | ICD-10-CM | POA: Insufficient documentation

## 2022-08-22 MED ORDER — SODIUM CHLORIDE (PF) 0.9 % IJ SOLN
INTRAMUSCULAR | Status: AC
  Filled 2022-08-22: qty 50

## 2022-08-22 MED ORDER — HEPARIN SOD (PORK) LOCK FLUSH 100 UNIT/ML IV SOLN
500.0000 [IU] | Freq: Once | INTRAVENOUS | Status: AC
  Administered 2022-08-22: 500 [IU] via INTRAVENOUS

## 2022-08-22 MED ORDER — IOHEXOL 300 MG/ML  SOLN
100.0000 mL | Freq: Once | INTRAMUSCULAR | Status: AC | PRN
  Administered 2022-08-22: 100 mL via INTRAVENOUS

## 2022-08-22 MED ORDER — HEPARIN SOD (PORK) LOCK FLUSH 100 UNIT/ML IV SOLN
INTRAVENOUS | Status: AC
  Filled 2022-08-22: qty 5

## 2022-08-23 MED FILL — Dexamethasone Sodium Phosphate Inj 100 MG/10ML: INTRAMUSCULAR | Qty: 1 | Status: AC

## 2022-08-24 ENCOUNTER — Inpatient Hospital Stay: Payer: Medicaid Other | Attending: Oncology

## 2022-08-24 ENCOUNTER — Inpatient Hospital Stay (HOSPITAL_BASED_OUTPATIENT_CLINIC_OR_DEPARTMENT_OTHER): Payer: Medicaid Other | Admitting: Hematology and Oncology

## 2022-08-24 ENCOUNTER — Inpatient Hospital Stay: Payer: Medicaid Other

## 2022-08-24 DIAGNOSIS — C50919 Malignant neoplasm of unspecified site of unspecified female breast: Secondary | ICD-10-CM | POA: Diagnosis not present

## 2022-08-24 DIAGNOSIS — C773 Secondary and unspecified malignant neoplasm of axilla and upper limb lymph nodes: Secondary | ICD-10-CM | POA: Diagnosis not present

## 2022-08-24 DIAGNOSIS — C50411 Malignant neoplasm of upper-outer quadrant of right female breast: Secondary | ICD-10-CM | POA: Diagnosis not present

## 2022-08-24 DIAGNOSIS — C50911 Malignant neoplasm of unspecified site of right female breast: Secondary | ICD-10-CM

## 2022-08-24 DIAGNOSIS — Z170 Estrogen receptor positive status [ER+]: Secondary | ICD-10-CM

## 2022-08-24 DIAGNOSIS — C7951 Secondary malignant neoplasm of bone: Secondary | ICD-10-CM | POA: Diagnosis not present

## 2022-08-24 DIAGNOSIS — Z5112 Encounter for antineoplastic immunotherapy: Secondary | ICD-10-CM | POA: Diagnosis present

## 2022-08-24 LAB — CBC WITH DIFFERENTIAL (CANCER CENTER ONLY)
Abs Immature Granulocytes: 0.01 10*3/uL (ref 0.00–0.07)
Basophils Absolute: 0 10*3/uL (ref 0.0–0.1)
Basophils Relative: 1 %
Eosinophils Absolute: 0.4 10*3/uL (ref 0.0–0.5)
Eosinophils Relative: 11 %
HCT: 35.4 % — ABNORMAL LOW (ref 36.0–46.0)
Hemoglobin: 12.2 g/dL (ref 12.0–15.0)
Immature Granulocytes: 0 %
Lymphocytes Relative: 21 %
Lymphs Abs: 0.8 10*3/uL (ref 0.7–4.0)
MCH: 36.1 pg — ABNORMAL HIGH (ref 26.0–34.0)
MCHC: 34.5 g/dL (ref 30.0–36.0)
MCV: 104.7 fL — ABNORMAL HIGH (ref 80.0–100.0)
Monocytes Absolute: 0.4 10*3/uL (ref 0.1–1.0)
Monocytes Relative: 11 %
Neutro Abs: 2.2 10*3/uL (ref 1.7–7.7)
Neutrophils Relative %: 56 %
Platelet Count: 249 10*3/uL (ref 150–400)
RBC: 3.38 MIL/uL — ABNORMAL LOW (ref 3.87–5.11)
RDW: 15.2 % (ref 11.5–15.5)
WBC Count: 3.9 10*3/uL — ABNORMAL LOW (ref 4.0–10.5)
nRBC: 0 % (ref 0.0–0.2)

## 2022-08-24 LAB — CMP (CANCER CENTER ONLY)
ALT: 22 U/L (ref 0–44)
AST: 28 U/L (ref 15–41)
Albumin: 3.6 g/dL (ref 3.5–5.0)
Alkaline Phosphatase: 79 U/L (ref 38–126)
Anion gap: 5 (ref 5–15)
BUN: 8 mg/dL (ref 6–20)
CO2: 26 mmol/L (ref 22–32)
Calcium: 8.9 mg/dL (ref 8.9–10.3)
Chloride: 108 mmol/L (ref 98–111)
Creatinine: 0.74 mg/dL (ref 0.44–1.00)
GFR, Estimated: >60 mL/min (ref >60)
Glucose, Bld: 114 mg/dL — ABNORMAL HIGH (ref 70–99)
Potassium: 3.4 mmol/L — ABNORMAL LOW (ref 3.5–5.1)
Sodium: 139 mmol/L (ref 135–145)
Total Bilirubin: 0.3 mg/dL (ref 0.3–1.2)
Total Protein: 6.3 g/dL — ABNORMAL LOW (ref 6.5–8.1)

## 2022-08-24 MED ORDER — DIPHENHYDRAMINE HCL 25 MG PO CAPS
50.0000 mg | ORAL_CAPSULE | Freq: Once | ORAL | Status: AC
  Administered 2022-08-24: 50 mg via ORAL
  Filled 2022-08-24: qty 2

## 2022-08-24 MED ORDER — SODIUM CHLORIDE 0.9% FLUSH
10.0000 mL | INTRAVENOUS | Status: DC | PRN
  Administered 2022-08-24: 10 mL

## 2022-08-24 MED ORDER — ACETAMINOPHEN 325 MG PO TABS
650.0000 mg | ORAL_TABLET | Freq: Once | ORAL | Status: AC
  Administered 2022-08-24: 650 mg via ORAL
  Filled 2022-08-24: qty 2

## 2022-08-24 MED ORDER — HEPARIN SOD (PORK) LOCK FLUSH 100 UNIT/ML IV SOLN
500.0000 [IU] | Freq: Once | INTRAVENOUS | Status: AC | PRN
  Administered 2022-08-24: 500 [IU]

## 2022-08-24 MED ORDER — PALONOSETRON HCL INJECTION 0.25 MG/5ML
0.2500 mg | Freq: Once | INTRAVENOUS | Status: AC
  Administered 2022-08-24: 0.25 mg via INTRAVENOUS
  Filled 2022-08-24: qty 5

## 2022-08-24 MED ORDER — SODIUM CHLORIDE 0.9 % IV SOLN
10.0000 mg | Freq: Once | INTRAVENOUS | Status: AC
  Administered 2022-08-24: 10 mg via INTRAVENOUS
  Filled 2022-08-24: qty 10

## 2022-08-24 MED ORDER — FAM-TRASTUZUMAB DERUXTECAN-NXKI CHEMO 100 MG IV SOLR
4.1800 mg/kg | Freq: Once | INTRAVENOUS | Status: AC
  Administered 2022-08-24: 400 mg via INTRAVENOUS
  Filled 2022-08-24: qty 20

## 2022-08-24 MED ORDER — DEXTROSE 5 % IV SOLN: Freq: Once | INTRAVENOUS | Status: AC

## 2022-08-24 MED ORDER — DENOSUMAB 120 MG/1.7ML ~~LOC~~ SOLN
120.0000 mg | Freq: Once | SUBCUTANEOUS | Status: AC
  Administered 2022-08-24: 120 mg via SUBCUTANEOUS
  Filled 2022-08-24: qty 1.7

## 2022-08-24 NOTE — Progress Notes (Signed)
Patient Care Team: [name], MD as PCP - General (Internal Medicine) [name], MD as Attending Physician (Radiation Oncology) [name], [name], NP as Nurse Practitioner (Hematology and Oncology) [name], MD as Consulting Physician (General Surgery) [name], DPM as Consulting Physician (Podiatry) [name], MD as Consulting Physician (Gastroenterology) [name], MD as Consulting Physician (Hematology and Oncology)  DIAGNOSIS:  Encounter Diagnoses  Name Primary?   Malignant neoplasm of upper-outer quadrant of right breast in female, estrogen receptor positive ([place])    Breast cancer metastasized to axillary lymph node, unspecified laterality ([place])    Malignant neoplasm of right breast, stage 4 ([place])     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[place])  06/21/2016 Initial Diagnosis   Breast cancer metastasized to axillary lymph node ([place])   06/21/2022 - 08/03/2022 Chemotherapy   Patient is on Treatment Plan : BREAST METASTATIC fam-trastuzumab deruxtecan-nxki (Enhertu) q21d     06/21/2022 -  Chemotherapy   Patient is on Treatment Plan : BREAST METASTATIC Fam-Trastuzumab Deruxtecan-nxki (Enhertu) (5.4) q21d     Malignant neoplasm of upper-outer quadrant of right breast in female, estrogen receptor positive ([place])  06/21/2016 Initial Diagnosis   Malignant neoplasm of upper-outer quadrant of right breast in female, estrogen receptor positive ([place])   06/21/2016 Initial Biopsy   Right breast biopsy, 10 oclock: IDC, grade 3, ER+(95%), PR+(80%),Ki67 20%, HER-2 negative (ratio 0.67). Right axilla core biopsy: carcinoma, grade 3, ER+(100%), PR+(90%), Ki67 25%, HER-2 negative (ratio 1.13).    07/17/2016 - 11/06/2016 Neo-Adjuvant Chemotherapy   Received 2 cycles of Doxorubicin and Cyclophosphamide, then transitioned to weekly Paclitaxel (due to repeated febrile neutropenia) x 7 cycles, stopped  early due to peripheral neuropathy, then completed her final 2 cycles of Doxorubicin and Cyclophosphamide.    12/19/2016 Surgery   Right breast lumpectomy ([place]): IDC, grade 2, 1.6cm and 0.3cm, margins negative, 3 SLN positive for metastatic carcinoma.     12/26/2016 Surgery   ALND: metastatic carcinoma in one of 20 lymph nodes, and three nodes from previous lumpectomy.  Four positive nodes, consistent with pN2a.   02/07/2017 - 03/21/2017 Radiation Therapy   Adjuvant radiation Isidore Moos): 1) Right breast and nodes - 4 field: 50 Gy in 25 fractions. IM NODES: >95% receive at least 45Gy/62fx. 50Gy to SCLV/PAB @ 2Gy /fraction x 25 fractions. 2) Right breast boost: 10 Gy in 5 fractions   04/2017 -  Anti-estrogen oral therapy   Anastrozole $RemoveBefo'1mg'MrYTGkFmkzy$  daily.  Bone density 03/23/2017 finds T score of -2.6, osteoporosis, plan to start Prolia following dental clearance Anastrozole stopped 01/16/18 Exemestane 25 mg daily 02/12/18  On [name], trial randomized to endocrine therapy alone   05/27/2020 Progression   chest CT scan 05/27/2020 shows bulky mediastinal and right hilar lymphadenopathy with right pleural nodules and a small right pleural effusion, no evidence of liver or bone involvement             (a) biopsy of right breast mass 06/02/2020 shows invasive ductal carcinoma, estrogen and progesterone receptor positive, HER-2 not amplified, with an MIB-1 of 40%   06/17/2020 Treatment Plan Change    fulvestrant to start 06/17/2020             (a) palbociclib to start 06/17/2020 at 125 mg daily 21 days on 7 days off             (b) palbociclib decreased to $RemoveBefo'125mg'EsPKmFnBMRp$  every other day x 11 doses on 07/23/2020             (  c) palbociclib dose decreased to100 mg daily, 21/7, starting with September cycle             (d) Palbociclib decreased to $RemoveBefo'75mg'xqkGbeEPzJA$  daily beginning with March cycle due to oral ulcers             (e) palbociclib discontinued November 2022 with progression   09/14/2021 PET scan   PET scan 09/14/2021 shows  progression in liver and bone   10/05/2021 Pathology Results   liver biopsy 10/05/2021 confirms metastatic carcinoma, estrogen receptor strongly positive, progesterone receptor and HER2 negative, with an MIB-1 of 30%.   10/2021 Treatment Plan Change   fulvestrant continued, Xgeva added, palbociclib changed to abemaciclib November 2022 -Dose Decreased to $RemoveBefo'50mg'woRlyWsLOaP$  PO BID Daily   03/07/2022 Miscellaneous   [facility] molecular testing: ER positive, ER positive, MTAP: Detected, ESR 1 negative, BRCA 1 and 2 negative, MSI stable, PD-L1 negative, PIK 3 CA negative, PR negative,   06/21/2022 - 08/03/2022 Chemotherapy   Patient is on Treatment Plan : BREAST METASTATIC fam-trastuzumab deruxtecan-nxki (Enhertu) q21d     06/21/2022 -  Chemotherapy   Patient is on Treatment Plan : BREAST METASTATIC Fam-Trastuzumab Deruxtecan-nxki (Enhertu) (5.4) q21d     Breast cancer, stage 4 ([place])  03/09/2022 Initial Diagnosis   Breast cancer, stage 4 ([place])   06/21/2022 - 08/03/2022 Chemotherapy   Patient is on Treatment Plan : BREAST METASTATIC fam-trastuzumab deruxtecan-nxki (Enhertu) q21d     06/21/2022 -  Chemotherapy   Patient is on Treatment Plan : BREAST METASTATIC Fam-Trastuzumab Deruxtecan-nxki (Enhertu) (5.4) q21d       CHIEF COMPLIANT: Follow-up metastatic breast cancer on Enhertu cycle 4 and discuss ct scans  INTERVAL HISTORY: April [name] is a 61 y.o. with above-mentioned history of ER+ breast cancer. She presents to the clinic today for a follow-up treatment and discuss ct scans. She states that she does have headaches and fatigue from treatment. Denies numbness and nausea. Overall she is tolerating treatment. Denies diarrhea but some constipation but she is drinking plenty of water.   ALLERGIES:  is allergic to tape, codeine, cymbalta [duloxetine hcl], hydrocodone, ultram [tramadol hcl], venlafaxine, and gabapentin.  MEDICATIONS:  Current Outpatient Medications  Medication Sig Dispense Refill   azithromycin  (ZITHROMAX) 250 MG tablet Take 2 tablets on day 1, then take 1 tablet daily on days 2 -5 6 each 0   Denosumab (XGEVA [place]) Inject 120 mg into the skin every 3 (three) months.     famotidine (PEPCID) 20 MG tablet Take 20 mg by mouth daily as needed for heartburn or indigestion.     loratadine (CLARITIN) 10 MG tablet Take 10 mg by mouth daily.     methylPREDNISolone (MEDROL) 4 MG tablet Take 6 tabs on day 1, then 5 tabs on day 2, then 4 tabs on day 3, then 3 tabs on day 4, 2 tabs on day 5 and 1 tab on day 6 21 tablet 0   naproxen sodium (ALEVE) 220 MG tablet Take 440-660 mg by mouth 2 (two) times daily as needed (pain).     omeprazole (PRILOSEC) 20 MG capsule Take 20 mg by mouth daily.     rizatriptan (MAXALT) 5 MG tablet Take 1 tablet (5 mg total) by mouth as needed for migraine. May repeat in 2 hours if needed 10 tablet 0   simethicone (MYLICON) 956 MG chewable tablet Chew 125 mg by mouth every 6 (six) hours as needed for flatulence.     valACYclovir (VALTREX) 1000 MG tablet Take 1  tablet (1,000 mg total) by mouth 2 (two) times daily. 1/2 tab BID (Patient taking differently: Take 500 mg by mouth See admin instructions. Take 500 mg daily, may take a second 500 mg dose as needed for outbreaks) 180 tablet 4   No current facility-administered medications for this visit.    PHYSICAL EXAMINATION: ECOG PERFORMANCE STATUS: 1 - Symptomatic but completely ambulatory  Vitals:   08/24/22 1437  BP: 107/72  Pulse: 92  Resp: 18  Temp: (!) 97.3 F (36.3 C)  SpO2: 98%   Filed Weights   08/24/22 1437  Weight: 208 lb 6.4 oz (94.5 kg)    BREAST: No palpable masses or nodules in either right or left breasts. No palpable axillary supraclavicular or infraclavicular adenopathy no breast tenderness or nipple discharge. (exam performed in the presence of a chaperone)  LABORATORY DATA:  I have reviewed the data as listed    Latest Ref Rng & Units 08/03/2022    2:37 PM 07/13/2022    1:59 PM 07/03/2022    1:57  PM  CMP  Glucose 70 - 99 mg/dL 122  89  104   BUN 6 - 20 mg/dL $Remove'9  12  16   'SLNucUf$ Creatinine 0.44 - 1.00 mg/dL 0.71  0.67  0.88   Sodium 135 - 145 mmol/L 141  138  140   Potassium 3.5 - 5.1 mmol/L 3.5  3.9  3.4   Chloride 98 - 111 mmol/L 112  105  106   CO2 22 - 32 mmol/L $RemoveB'25  28  29   'gCMdCZjA$ Calcium 8.9 - 10.3 mg/dL 8.9  9.2  9.0   Total Protein 6.5 - 8.1 g/dL 6.4  6.4  6.6   Total Bilirubin 0.3 - 1.2 mg/dL 0.3  0.3  0.3   Alkaline Phos 38 - 126 U/L 72  72  74   AST 15 - 41 U/L $Remo'23  30  26   'gPmTV$ ALT 0 - 44 U/L $Remo'18  26  20     'phLas$ Lab Results  Component Value Date   WBC 3.8 (L) 08/03/2022   HGB 12.3 08/03/2022   HCT 35.2 (L) 08/03/2022   MCV 104.1 (H) 08/03/2022   PLT 265 08/03/2022   NEUTROABS 2.0 08/03/2022    ASSESSMENT & PLAN:  Malignant neoplasm of upper-outer quadrant of right breast in female, estrogen receptor positive ([place]) 1.  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 bone metastasis: Currently on Xgeva every 3 months.   CT CAP 02/01/2022: Mild progression of disease with increased number and size of the numerous lung nodules in the right lung.  Persistent lymphadenopathy and right hilar and right paratracheal nodal areas, numerous hypovascular hepatic lesions slightly larger when compared to previous.   CA 27-29: 226 on 02/01/2022 (gradually progressing)   Plan: [facility] molecular testing: ER positive, ER positive, TMB 5, PD-L1 negative, PIK 3 CA negative  -------------------------------------------------------- Current treatment: Enhertu cycle 4 Echocardiogram: 06/14/2022: EF 55 to 60%   Chemo toxicities: 1.   Nausea 2. fatigue 3.  Leukopenia: We will have to reduce the dosage 4.  Alopecia: Patient is losing significant amount of hair   CT CAP 08/23/2022: Marked interval response to therapy.  Interval resolution of mediastinal and right hilar lymphadenopathy.  Near complete resolution of the nodularity seen previously along  the right major and minor fissures.  Interval decrease in the liver metastases.  Since she had a tremendous response we will continue with  the treatment.   Return to clinic in 3 weeks for Enhertu    No orders of the defined types were placed in this encounter.  The patient has a good understanding of the overall plan. she agrees with it. she will call with any problems that may develop before the next visit here. Total time spent: 30 mins including face to face time and time spent for planning, charting and co-ordination of care   Harriette Ohara, MD 08/24/22    I Gardiner Coins am scribing for Dr. Lindi Adie  I have reviewed the above documentation for accuracy and completeness, and I agree with the above.

## 2022-08-24 NOTE — Patient Instructions (Signed)
[facility] ONCOLOGY  Discharge Instructions: Thank you for choosing [facility] to provide your oncology and hematology care.   If you have a lab appointment with the [facility], please go directly to the [facility] and check in at the registration area.   Wear comfortable clothing and clothing appropriate for easy access to any Portacath or PICC line.   We strive to give you quality time with your provider. You may need to reschedule your appointment if you arrive late (15 or more minutes).  Arriving late affects you and other patients whose appointments are after yours.  Also, if you miss three or more appointments without notifying the office, you may be dismissed from the clinic at the provider's discretion.      For prescription refill requests, have your pharmacy contact our office and allow 72 hours for refills to be completed.    Today you received the following chemotherapy and/or immunotherapy agent: Enhertu      To help prevent nausea and vomiting after your treatment, we encourage you to take your nausea medication as directed.  BELOW ARE SYMPTOMS THAT SHOULD BE REPORTED IMMEDIATELY: *FEVER GREATER THAN 100.4 F (38 C) OR HIGHER *CHILLS OR SWEATING *NAUSEA AND VOMITING THAT IS NOT CONTROLLED WITH YOUR NAUSEA MEDICATION *UNUSUAL SHORTNESS OF BREATH *UNUSUAL BRUISING OR BLEEDING *URINARY PROBLEMS (pain or burning when urinating, or frequent urination) *BOWEL PROBLEMS (unusual diarrhea, constipation, pain near the anus) TENDERNESS IN MOUTH AND THROAT WITH OR WITHOUT PRESENCE OF ULCERS (sore throat, sores in mouth, or a toothache) UNUSUAL RASH, SWELLING OR PAIN  UNUSUAL VAGINAL DISCHARGE OR ITCHING   Items with * indicate a potential emergency and should be followed up as soon as possible or go to the Emergency Department if any problems should occur.  Please show the CHEMOTHERAPY ALERT CARD or IMMUNOTHERAPY ALERT CARD at check-in to the  Emergency Department and triage nurse.  Should you have questions after your visit or need to cancel or reschedule your appointment, please contact [facility]  Dept: [phone]  and follow the prompts.  Office hours are 8:00 a.m. to 4:30 p.m. Monday - Friday. Please note that voicemails left after 4:00 p.m. may not be returned until the following business day.  We are closed weekends and major holidays. You have access to a nurse at all times for urgent questions. Please call the main number to the clinic Dept: [phone] and follow the prompts.   For any non-urgent questions, you may also contact your provider using MyChart. We now offer e-Visits for anyone 60 and older to request care online for non-urgent symptoms. For details visit [email or web address].   Also download the MyChart app! Go to the app store, search "MyChart", open the app, select [facility], and log in with your MyChart username and password.  Masks are optional in the cancer centers. If you would like for your care team to wear a mask while they are taking care of you, please let them know. For doctor visits, patients may have with them one support Lavender Stanke who is at least 60 years old. At this time, visitors are not allowed in the infusion area.  Fam-Trastuzumab Deruxtecan Injection What is this medication? FAM-TRASTUZUMAB DERUXTECAN (fam-tras TOOZ eu mab DER ux TEE kan) treats some types of cancer. It works by blocking a protein that causes cancer cells to grow and multiply. This helps to slow or stop the spread of cancer cells. This medicine may  be used for other purposes; ask your health care provider or pharmacist if you have questions. COMMON BRAND NAME(S): ENHERTU What should I tell my care team before I take this medication? They need to know if you have any of these conditions: Heart disease Heart failure Infection, especially a viral infection, such as chickenpox, cold sores, or  herpes Liver disease Lung or breathing disease, such as asthma or COPD An unusual or allergic reaction to fam-trastuzumab deruxtecan, other medications, foods, dyes, or preservatives Pregnant or trying to get pregnant Breast-feeding How should I use this medication? This medication is injected into a vein. It is given by your care team in a hospital or clinic setting. A special MedGuide will be given to you before each treatment. Be sure to read this information carefully each time. Talk to your care team about the use of this medication in children. Special care may be needed. Overdosage: If you think you have taken too much of this medicine contact a poison control center or emergency room at once. NOTE: This medicine is only for you. Do not share this medicine with others. What if I miss a dose? It is important not to miss your dose. Call your care team if you are unable to keep an appointment. What may interact with this medication? Interactions are not expected. This list may not describe all possible interactions. Give your health care provider a list of all the medicines, herbs, non-prescription drugs, or dietary supplements you use. Also tell them if you smoke, drink alcohol, or use illegal drugs. Some items may interact with your medicine. What should I watch for while using this medication? Visit your care team for regular checks on your progress. Tell your care team if your symptoms do not start to get better or if they get worse. Your condition will be monitored carefully while you are receiving this medication. Do not become pregnant while taking this medication or for 7 months after stopping it. Women should inform their care team if they wish to become pregnant or think they might be pregnant. Men should not father a child while taking this medication and for 4 months after stopping it. There is potential for serious side effects to an unborn child. Talk to your care team for more  information. Do not breast-feed an infant while taking this medication or for 7 months after the last dose. This medication has caused decreased sperm counts in some men. This may make it more difficult to father a child. Talk to your care team if you are concerned about your fertility. This medication may increase your risk to bruise or bleed. Call your care team if you notice any unusual bleeding. Be careful brushing or flossing your teeth or using a toothpick because you may get an infection or bleed more easily. If you have any dental work done, tell your dentist you are receiving this medication. This medication may cause dry eyes and blurred vision. If you wear contact lenses, you may feel some discomfort. Lubricating eye drops may help. See your care team if the problem does not go away or is severe. This medication may increase your risk of getting an infection. Call your care team for advice if you get a fever, chills, sore throat, or other symptoms of a cold or flu. Do not treat yourself. Try to avoid being around people who are sick. Avoid taking medications that contain aspirin, acetaminophen, ibuprofen, naproxen, or ketoprofen unless instructed by your care team. These medications  may hide a fever. What side effects may I notice from receiving this medication? Side effects that you should report to your care team as soon as possible: Allergic reactions--skin rash, itching, hives, swelling of the face, lips, tongue, or throat Dry cough, shortness of breath or trouble breathing Infection--fever, chills, cough, sore throat, wounds that don't heal, pain or trouble when passing urine, general feeling of discomfort or being unwell Heart failure--shortness of breath, swelling of the ankles, feet, or hands, sudden weight gain, unusual weakness or fatigue Unusual bruising or bleeding Side effects that usually do not require medical attention (report these to your care team if they continue or are  bothersome): Constipation Diarrhea Hair loss Muscle pain Nausea Vomiting This list may not describe all possible side effects. Call your doctor for medical advice about side effects. You may report side effects to FDA at 1-800-FDA-1088. Where should I keep my medication? This medication is given in a hospital or clinic. It will not be stored at home. NOTE: This sheet is a summary. It may not cover all possible information. If you have questions about this medicine, talk to your doctor, pharmacist, or health care provider.  2023 Elsevier/Gold Standard (2021-09-06 00:00:00)

## 2022-08-24 NOTE — Assessment & Plan Note (Signed)
1. 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  CT CAP 02/01/2022: Mild progression of disease with increased number and size of the numerous lung nodules in the right lung. Persistent lymphadenopathy and right hilar and right paratracheal nodal areas, numerous hypovascular hepatic lesions slightly larger when compared to previous.  CA 27-29: 226 on 02/01/2022 (gradually progressing)  Plan: [facility] molecular testing:ER positive, ER positive, TMB 5, PD-L1 negative, PIK 3 CA negative -------------------------------------------------------- Current treatment: Enhertu cycle4 Echocardiogram: 06/14/2022: EF 55 to 60%  Chemotoxicities: 1. Nausea 2. fatigue 3.Leukopenia: We will have to reduce the dosage 4.Alopecia: Patient is losing significant amount of hair  CT CAP 08/23/2022: Marked interval response to therapy.  Interval resolution of mediastinal and right hilar lymphadenopathy.  Near complete resolution of the nodularity seen previously along the right major and minor fissures.  Interval decrease in the liver metastases.  Since she had a tremendous response we will continue with the treatment.   Return to clinic in 3 weeks for Enhertu

## 2022-08-24 NOTE — Progress Notes (Signed)
Per Dr. Lindi Adie, okay to proceed with previous CMP

## 2022-08-25 LAB — CANCER ANTIGEN 27.29: CA 27.29: 204.3 U/mL — ABNORMAL HIGH (ref 0.0–38.6)

## 2022-08-31 ENCOUNTER — Other Ambulatory Visit: Payer: Self-pay | Admitting: Hematology and Oncology

## 2022-08-31 ENCOUNTER — Encounter: Payer: Self-pay | Admitting: Hematology and Oncology

## 2022-09-01 ENCOUNTER — Other Ambulatory Visit: Payer: Self-pay | Admitting: *Deleted

## 2022-09-01 DIAGNOSIS — C50919 Malignant neoplasm of unspecified site of unspecified female breast: Secondary | ICD-10-CM

## 2022-09-02 ENCOUNTER — Other Ambulatory Visit: Payer: Self-pay

## 2022-09-03 ENCOUNTER — Other Ambulatory Visit: Payer: Self-pay

## 2022-09-04 ENCOUNTER — Other Ambulatory Visit: Payer: Self-pay

## 2022-09-07 ENCOUNTER — Encounter: Payer: Self-pay | Admitting: Hematology and Oncology

## 2022-09-10 NOTE — Progress Notes (Signed)
Patient Care Team: [name], MD as PCP - General (Internal Medicine) [name], MD as Attending Physician (Radiation Oncology) [name], [name], NP as Nurse Practitioner (Hematology and Oncology) [name], MD as Consulting Physician (General Surgery) [name], DPM as Consulting Physician (Podiatry) [name], MD as Consulting Physician (Gastroenterology) [name], MD as Attending Physician (Hematology and Oncology)  DIAGNOSIS:  Encounter Diagnoses  Name Primary?   Malignant neoplasm of upper-outer quadrant of right breast in female, estrogen receptor positive ([place])    Breast cancer metastasized to axillary lymph node, unspecified laterality ([place])    Malignant neoplasm of right breast, stage 4 ([place])     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[place])  06/21/2016 Initial Diagnosis   Breast cancer metastasized to axillary lymph node ([place])   06/21/2022 - 08/03/2022 Chemotherapy   Patient is on Treatment Plan : BREAST METASTATIC fam-trastuzumab deruxtecan-nxki (Enhertu) q21d     06/21/2022 -  Chemotherapy   Patient is on Treatment Plan : BREAST METASTATIC Fam-Trastuzumab Deruxtecan-nxki (Enhertu) (5.4) q21d     Malignant neoplasm of upper-outer quadrant of right breast in female, estrogen receptor positive ([place])  06/21/2016 Initial Diagnosis   Malignant neoplasm of upper-outer quadrant of right breast in female, estrogen receptor positive ([place])   06/21/2016 Initial Biopsy   Right breast biopsy, 10 oclock: IDC, grade 3, ER+(95%), PR+(80%),Ki67 20%, HER-2 negative (ratio 0.67). Right axilla core biopsy: carcinoma, grade 3, ER+(100%), PR+(90%), Ki67 25%, HER-2 negative (ratio 1.13).    07/17/2016 - 11/06/2016 Neo-Adjuvant Chemotherapy   Received 2 cycles of Doxorubicin and Cyclophosphamide, then transitioned to weekly Paclitaxel (due to repeated febrile neutropenia) x 7 cycles, stopped  early due to peripheral neuropathy, then completed her final 2 cycles of Doxorubicin and Cyclophosphamide.    12/19/2016 Surgery   Right breast lumpectomy ([place]): IDC, grade 2, 1.6cm and 0.3cm, margins negative, 3 SLN positive for metastatic carcinoma.     12/26/2016 Surgery   ALND: metastatic carcinoma in one of 20 lymph nodes, and three nodes from previous lumpectomy.  Four positive nodes, consistent with pN2a.   02/07/2017 - 03/21/2017 Radiation Therapy   Adjuvant radiation Isidore Moos): 1) Right breast and nodes - 4 field: 50 Gy in 25 fractions. IM NODES: >95% receive at least 45Gy/44fx. 50Gy to SCLV/PAB @ 2Gy /fraction x 25 fractions. 2) Right breast boost: 10 Gy in 5 fractions   04/2017 -  Anti-estrogen oral therapy   Anastrozole $RemoveBefo'1mg'HkMYfJSjHyT$  daily.  Bone density 03/23/2017 finds T score of -2.6, osteoporosis, plan to start Prolia following dental clearance Anastrozole stopped 01/16/18 Exemestane 25 mg daily 02/12/18  On [name], trial randomized to endocrine therapy alone   05/27/2020 Progression   chest CT scan 05/27/2020 shows bulky mediastinal and right hilar lymphadenopathy with right pleural nodules and a small right pleural effusion, no evidence of liver or bone involvement             (a) biopsy of right breast mass 06/02/2020 shows invasive ductal carcinoma, estrogen and progesterone receptor positive, HER-2 not amplified, with an MIB-1 of 40%   06/17/2020 Treatment Plan Change    fulvestrant to start 06/17/2020             (a) palbociclib to start 06/17/2020 at 125 mg daily 21 days on 7 days off             (b) palbociclib decreased to $RemoveBefo'125mg'riSoMXubAJC$  every other day x 11 doses on 07/23/2020             (  c) palbociclib dose decreased to100 mg daily, 21/7, starting with September cycle             (d) Palbociclib decreased to $RemoveBefo'75mg'BnVYqadEYsY$  daily beginning with March cycle due to oral ulcers             (e) palbociclib discontinued November 2022 with progression   09/14/2021 PET scan   PET scan 09/14/2021 shows  progression in liver and bone   10/05/2021 Pathology Results   liver biopsy 10/05/2021 confirms metastatic carcinoma, estrogen receptor strongly positive, progesterone receptor and HER2 negative, with an MIB-1 of 30%.   10/2021 Treatment Plan Change   fulvestrant continued, Xgeva added, palbociclib changed to abemaciclib November 2022 -Dose Decreased to $RemoveBefo'50mg'vfACbAEkYPF$  PO BID Daily   03/07/2022 Miscellaneous   [facility] molecular testing: ER positive, ER positive, MTAP: Detected, ESR 1 negative, BRCA 1 and 2 negative, MSI stable, PD-L1 negative, PIK 3 CA negative, PR negative,   06/21/2022 - 08/03/2022 Chemotherapy   Patient is on Treatment Plan : BREAST METASTATIC fam-trastuzumab deruxtecan-nxki (Enhertu) q21d     06/21/2022 -  Chemotherapy   Patient is on Treatment Plan : BREAST METASTATIC Fam-Trastuzumab Deruxtecan-nxki (Enhertu) (5.4) q21d     Breast cancer, stage 4 ([place])  03/09/2022 Initial Diagnosis   Breast cancer, stage 4 ([place])   06/21/2022 - 08/03/2022 Chemotherapy   Patient is on Treatment Plan : BREAST METASTATIC fam-trastuzumab deruxtecan-nxki (Enhertu) q21d     06/21/2022 -  Chemotherapy   Patient is on Treatment Plan : BREAST METASTATIC Fam-Trastuzumab Deruxtecan-nxki (Enhertu) (5.4) q21d       CHIEF COMPLIANT: Follow-up metastatic breast cancer on Enhertu cycle 4  INTERVAL HISTORY: April [name] is a 61 y.o. with above-mentioned history of ER+ breast cancer. She presents to the clinic today for a follow-up treatment. She states the second week of treatment is when she feels bad the most. She says around the 3rd week she start feeling better. She does have knee pain and she feels drained today because she has been doing yard work.  ALLERGIES:  is allergic to tape, codeine, cymbalta [duloxetine hcl], hydrocodone, ultram [tramadol hcl], venlafaxine, and gabapentin.  MEDICATIONS:  Current Outpatient Medications  Medication Sig Dispense Refill   azithromycin (ZITHROMAX) 250 MG tablet Take 2  tablets on day 1, then take 1 tablet daily on days 2 -5 6 each 0   Denosumab (XGEVA [place]) Inject 120 mg into the skin every 3 (three) months.     famotidine (PEPCID) 20 MG tablet Take 20 mg by mouth daily as needed for heartburn or indigestion.     loratadine (CLARITIN) 10 MG tablet Take 10 mg by mouth daily.     methylPREDNISolone (MEDROL) 4 MG tablet Take 6 tabs on day 1, then 5 tabs on day 2, then 4 tabs on day 3, then 3 tabs on day 4, 2 tabs on day 5 and 1 tab on day 6 21 tablet 0   naproxen sodium (ALEVE) 220 MG tablet Take 440-660 mg by mouth 2 (two) times daily as needed (pain).     omeprazole (PRILOSEC) 20 MG capsule Take 20 mg by mouth daily.     rizatriptan (MAXALT) 5 MG tablet Take 1 tablet (5 mg total) by mouth as needed for migraine. May repeat in 2 hours if needed 10 tablet 0   simethicone (MYLICON) 888 MG chewable tablet Chew 125 mg by mouth every 6 (six) hours as needed for flatulence.     valACYclovir (VALTREX) 1000 MG tablet Take 1  tablet (1,000 mg total) by mouth 2 (two) times daily. 1/2 tab BID (Patient taking differently: Take 500 mg by mouth See admin instructions. Take 500 mg daily, may take a second 500 mg dose as needed for outbreaks) 180 tablet 4   No current facility-administered medications for this visit.    PHYSICAL EXAMINATION: ECOG PERFORMANCE STATUS: 1 - Symptomatic but completely ambulatory  Vitals:   09/14/22 1508  BP: (!) 153/87  Pulse: 78  Resp: 18  Temp: (!) 97.3 F (36.3 C)  SpO2: 99%   Filed Weights   09/14/22 1508  Weight: 212 lb 12.8 oz (96.5 kg)     LABORATORY DATA:  I have reviewed the data as listed    Latest Ref Rng & Units 08/24/2022    2:22 PM 08/03/2022    2:37 PM 07/13/2022    1:59 PM  CMP  Glucose 70 - 99 mg/dL 114  122  89   BUN 6 - 20 mg/dL $Remove'8  9  12   'BQBoRlG$ Creatinine 0.44 - 1.00 mg/dL 0.74  0.71  0.67   Sodium 135 - 145 mmol/L 139  141  138   Potassium 3.5 - 5.1 mmol/L 3.4  3.5  3.9   Chloride 98 - 111 mmol/L 108  112  105   CO2  22 - 32 mmol/L $RemoveB'26  25  28   'uqaDtCkL$ Calcium 8.9 - 10.3 mg/dL 8.9  8.9  9.2   Total Protein 6.5 - 8.1 g/dL 6.3  6.4  6.4   Total Bilirubin 0.3 - 1.2 mg/dL 0.3  0.3  0.3   Alkaline Phos 38 - 126 U/L 79  72  72   AST 15 - 41 U/L $Remo'28  23  30   'UcIoB$ ALT 0 - 44 U/L $Remo'22  18  26     'tPWNA$ Lab Results  Component Value Date   WBC 2.8 (L) 09/14/2022   HGB 11.5 (L) 09/14/2022   HCT 33.1 (L) 09/14/2022   MCV 100.9 (H) 09/14/2022   PLT 231 09/14/2022   NEUTROABS 1.2 (L) 09/14/2022    ASSESSMENT & PLAN:  Malignant neoplasm of upper-outer quadrant of right breast in female, estrogen receptor positive ([place]) 1.  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 bone metastasis: Currently on Xgeva every 3 months.   CT CAP 02/01/2022: Mild progression of disease with increased number and size of the numerous lung nodules in the right lung.  Persistent lymphadenopathy and right hilar and right paratracheal nodal areas, numerous hypovascular hepatic lesions slightly larger when compared to previous.   CA 27-29: 226 on 02/01/2022 (gradually progressing)   Plan: [facility] molecular testing: ER positive, ER positive, TMB 5, PD-L1 negative, PIK 3 CA negative  -------------------------------------------------------- Current treatment: Enhertu cycle 4 Echocardiogram: 06/14/2022: EF 55 to 60%   Chemo toxicities: 1.   Nausea 2. fatigue 3.  Leukopenia: We once again reduced the dosage today.  Okay to treat with ANC of 1.2 4.  Alopecia: Patient is losing significant amount of hair   CT CAP 08/23/2022: Marked interval response to therapy.  Interval resolution of mediastinal and right hilar lymphadenopathy.  Near complete resolution of the nodularity seen previously along the right major and minor fissures.  Interval decrease in the liver metastases.    Okay to treat today with ANC of 1.2 Return to clinic in 3 weeks for Enhertu    Orders Placed This Encounter  Procedures    CA 27.29  Standing Status:   Standing    Number of Occurrences:   100    Standing Expiration Date:   09/15/2023   The patient has a good understanding of the overall plan. she agrees with it. she will call with any problems that may develop before the next visit here. Total time spent: 30 mins including face to face time and time spent for planning, charting and co-ordination of care   Harriette Ohara, MD 09/14/22    I Gardiner Coins am scribing for Dr. Lindi Adie  I have reviewed the above documentation for accuracy and completeness, and I agree with the above.

## 2022-09-13 MED FILL — Dexamethasone Sodium Phosphate Inj 100 MG/10ML: INTRAMUSCULAR | Qty: 1 | Status: AC

## 2022-09-14 ENCOUNTER — Inpatient Hospital Stay: Payer: Medicaid Other

## 2022-09-14 ENCOUNTER — Other Ambulatory Visit: Payer: Self-pay | Admitting: Hematology and Oncology

## 2022-09-14 ENCOUNTER — Inpatient Hospital Stay (HOSPITAL_BASED_OUTPATIENT_CLINIC_OR_DEPARTMENT_OTHER): Payer: Medicaid Other | Admitting: Hematology and Oncology

## 2022-09-14 ENCOUNTER — Inpatient Hospital Stay: Payer: Medicaid Other | Attending: Hematology and Oncology

## 2022-09-14 DIAGNOSIS — C50411 Malignant neoplasm of upper-outer quadrant of right female breast: Secondary | ICD-10-CM | POA: Diagnosis not present

## 2022-09-14 DIAGNOSIS — Z170 Estrogen receptor positive status [ER+]: Secondary | ICD-10-CM | POA: Diagnosis not present

## 2022-09-14 DIAGNOSIS — C50919 Malignant neoplasm of unspecified site of unspecified female breast: Secondary | ICD-10-CM | POA: Diagnosis not present

## 2022-09-14 DIAGNOSIS — R5383 Other fatigue: Secondary | ICD-10-CM | POA: Insufficient documentation

## 2022-09-14 DIAGNOSIS — Z79624 Long term (current) use of inhibitors of nucleotide synthesis: Secondary | ICD-10-CM | POA: Diagnosis not present

## 2022-09-14 DIAGNOSIS — C773 Secondary and unspecified malignant neoplasm of axilla and upper limb lymph nodes: Secondary | ICD-10-CM

## 2022-09-14 DIAGNOSIS — D72829 Elevated white blood cell count, unspecified: Secondary | ICD-10-CM | POA: Diagnosis not present

## 2022-09-14 DIAGNOSIS — Z5112 Encounter for antineoplastic immunotherapy: Secondary | ICD-10-CM | POA: Diagnosis present

## 2022-09-14 DIAGNOSIS — R110 Nausea: Secondary | ICD-10-CM | POA: Diagnosis not present

## 2022-09-14 DIAGNOSIS — Z79899 Other long term (current) drug therapy: Secondary | ICD-10-CM | POA: Insufficient documentation

## 2022-09-14 DIAGNOSIS — C50911 Malignant neoplasm of unspecified site of right female breast: Secondary | ICD-10-CM | POA: Diagnosis not present

## 2022-09-14 LAB — CBC WITH DIFFERENTIAL (CANCER CENTER ONLY)
Abs Immature Granulocytes: 0.01 10*3/uL (ref 0.00–0.07)
Basophils Absolute: 0 10*3/uL (ref 0.0–0.1)
Basophils Relative: 1 %
Eosinophils Absolute: 0.3 10*3/uL (ref 0.0–0.5)
Eosinophils Relative: 11 %
HCT: 33.1 % — ABNORMAL LOW (ref 36.0–46.0)
Hemoglobin: 11.5 g/dL — ABNORMAL LOW (ref 12.0–15.0)
Immature Granulocytes: 0 %
Lymphocytes Relative: 30 %
Lymphs Abs: 0.8 10*3/uL (ref 0.7–4.0)
MCH: 35.1 pg — ABNORMAL HIGH (ref 26.0–34.0)
MCHC: 34.7 g/dL (ref 30.0–36.0)
MCV: 100.9 fL — ABNORMAL HIGH (ref 80.0–100.0)
Monocytes Absolute: 0.4 10*3/uL (ref 0.1–1.0)
Monocytes Relative: 13 %
Neutro Abs: 1.2 10*3/uL — ABNORMAL LOW (ref 1.7–7.7)
Neutrophils Relative %: 45 %
Platelet Count: 231 10*3/uL (ref 150–400)
RBC: 3.28 MIL/uL — ABNORMAL LOW (ref 3.87–5.11)
RDW: 15.7 % — ABNORMAL HIGH (ref 11.5–15.5)
WBC Count: 2.8 10*3/uL — ABNORMAL LOW (ref 4.0–10.5)
nRBC: 0 % (ref 0.0–0.2)

## 2022-09-14 LAB — CMP (CANCER CENTER ONLY)
ALT: 17 U/L (ref 0–44)
AST: 22 U/L (ref 15–41)
Albumin: 3.5 g/dL (ref 3.5–5.0)
Alkaline Phosphatase: 64 U/L (ref 38–126)
Anion gap: 5 (ref 5–15)
BUN: 11 mg/dL (ref 6–20)
CO2: 27 mmol/L (ref 22–32)
Calcium: 9.1 mg/dL (ref 8.9–10.3)
Chloride: 107 mmol/L (ref 98–111)
Creatinine: 0.6 mg/dL (ref 0.44–1.00)
GFR, Estimated: >60 mL/min (ref >60)
Glucose, Bld: 89 mg/dL (ref 70–99)
Potassium: 3.1 mmol/L — ABNORMAL LOW (ref 3.5–5.1)
Sodium: 139 mmol/L (ref 135–145)
Total Bilirubin: 0.4 mg/dL (ref 0.3–1.2)
Total Protein: 6.3 g/dL — ABNORMAL LOW (ref 6.5–8.1)

## 2022-09-14 MED ORDER — PALONOSETRON HCL INJECTION 0.25 MG/5ML
0.2500 mg | Freq: Once | INTRAVENOUS | Status: AC
  Administered 2022-09-14: 0.25 mg via INTRAVENOUS
  Filled 2022-09-14: qty 5

## 2022-09-14 MED ORDER — FAM-TRASTUZUMAB DERUXTECAN-NXKI CHEMO 100 MG IV SOLR
300.0000 mg | Freq: Once | INTRAVENOUS | Status: AC
  Administered 2022-09-14: 300 mg via INTRAVENOUS
  Filled 2022-09-14: qty 15

## 2022-09-14 MED ORDER — ACETAMINOPHEN 325 MG PO TABS
650.0000 mg | ORAL_TABLET | Freq: Once | ORAL | Status: AC
  Administered 2022-09-14: 650 mg via ORAL
  Filled 2022-09-14: qty 2

## 2022-09-14 MED ORDER — HEPARIN SOD (PORK) LOCK FLUSH 100 UNIT/ML IV SOLN
500.0000 [IU] | Freq: Once | INTRAVENOUS | Status: AC | PRN
  Administered 2022-09-14: 500 [IU]

## 2022-09-14 MED ORDER — CEPHALEXIN 500 MG PO CAPS: 500.0000 mg | ORAL_CAPSULE | Freq: Two times a day (BID) | ORAL | 0 refills | Status: DC

## 2022-09-14 MED ORDER — DIPHENHYDRAMINE HCL 25 MG PO CAPS
50.0000 mg | ORAL_CAPSULE | Freq: Once | ORAL | Status: AC
  Administered 2022-09-14: 50 mg via ORAL
  Filled 2022-09-14: qty 2

## 2022-09-14 MED ORDER — SODIUM CHLORIDE 0.9% FLUSH
10.0000 mL | INTRAVENOUS | Status: DC | PRN
  Administered 2022-09-14: 10 mL

## 2022-09-14 MED ORDER — SODIUM CHLORIDE 0.9% FLUSH
10.0000 mL | INTRAVENOUS | Status: DC | PRN
  Administered 2022-09-14: 10 mL via INTRAVENOUS

## 2022-09-14 MED ORDER — SODIUM CHLORIDE 0.9 % IV SOLN
10.0000 mg | Freq: Once | INTRAVENOUS | Status: AC
  Administered 2022-09-14: 10 mg via INTRAVENOUS
  Filled 2022-09-14: qty 10

## 2022-09-14 MED ORDER — DEXTROSE 5 % IV SOLN: Freq: Once | INTRAVENOUS | Status: AC

## 2022-09-14 NOTE — Patient Instructions (Signed)
[facility] ONCOLOG[facility]  Discharge Instructions: Thank you for choosing [facility] to provide your oncology and hematology care.   If you have a lab appointment with the [facility], please go directly to the [facility] and check in at the registration area.   Wear comfortable clothing and clothing appropriate for easy access to any Portacath or PICC line.   We strive to give you quality time with your provider. You may need to reschedule your appointment if you arrive late (15 or more minutes).  Arriving late affects you and other patients whose appointments are after yours.  Also, if you miss three or more appointments without notifying the office, you may be dismissed from the clinic at the provider's discretion.      For prescription refill requests, have your pharmacy contact our office and allow 72 hours for refills to be completed.    Today you received the following chemotherapy and/or immunotherapy agent: Enhertu      To help prevent nausea and vomiting after your treatment, we encourage you to take your nausea medication as directed.  BELOW ARE SYMPTOMS THAT SHOULD BE REPORTED IMMEDIATELY: *FEVER GREATER THAN 100.4 F (38 C) OR HIGHER *CHILLS OR SWEATING *NAUSEA AND VOMITING THAT IS NOT CONTROLLED WITH YOUR NAUSEA MEDICATION *UNUSUAL SHORTNESS OF BREATH *UNUSUAL BRUISING OR BLEEDING *URINARY PROBLEMS (pain or burning when urinating, or frequent urination) *BOWEL PROBLEMS (unusual diarrhea, constipation, pain near the anus) TENDERNESS IN MOUTH AND THROAT WITH OR WITHOUT PRESENCE OF ULCERS (sore throat, sores in mouth, or a toothache) UNUSUAL RASH, SWELLING OR PAIN  UNUSUAL VAGINAL DISCHARGE OR ITCHING   Items with * indicate a potential emergency and should be followed up as soon as possible or go to the Emergency Department if any problems should occur.  Please show the CHEMOTHERAPY ALERT CARD or IMMUNOTHERAPY ALERT CARD at check-in to the  Emergency Department and triage nurse.  Should you have questions after your visit or need to cancel or reschedule your appointment, please contact [facility]  Dept: [phone]  and follow the prompts.  Office hours are 8:00 a.m. to 4:30 p.m. Monday - Friday. Please note that voicemails left after 4:00 p.m. may not be returned until the following business day.  We are closed weekends and major holidays. You have access to a nurse at all times for urgent questions. Please call the main number to the clinic Dept: [phone] and follow the prompts.   For any non-urgent questions, you may also contact your provider using MyChart. We now offer e-Visits for anyone 27 and older to request care online for non-urgent symptoms. For details visit [email or web address].   Also download the MyChart app! Go to the app store, search "MyChart", open the app, select [facility], and log in with your MyChart username and password.  Masks are optional in the cancer centers. If you would like for your care team to wear a mask while they are taking care of you, please let them know. For doctor visits, patients may have with them one support person who is at least 60 years old. At this time, visitors are not allowed in the infusion area.  Fam-Trastuzumab Deruxtecan Injection What is this medication? FAM-TRASTUZUMAB DERUXTECAN (fam-tras TOOZ eu mab DER ux TEE kan) treats some types of cancer. It works by blocking a protein that causes cancer cells to grow and multiply. This helps to slow or stop the spread of cancer cells. This medicine may  be used for other purposes; ask your health care provider or pharmacist if you have questions. COMMON BRAND NAME(S): ENHERTU What should I tell my care team before I take this medication? They need to know if you have any of these conditions: Heart disease Heart failure Infection, especially a viral infection, such as chickenpox, cold sores, or  herpes Liver disease Lung or breathing disease, such as asthma or COPD An unusual or allergic reaction to fam-trastuzumab deruxtecan, other medications, foods, dyes, or preservatives Pregnant or trying to get pregnant Breast-feeding How should I use this medication? This medication is injected into a vein. It is given by your care team in a hospital or clinic setting. A special MedGuide will be given to you before each treatment. Be sure to read this information carefully each time. Talk to your care team about the use of this medication in children. Special care may be needed. Overdosage: If you think you have taken too much of this medicine contact a poison control center or emergency room at once. NOTE: This medicine is only for you. Do not share this medicine with others. What if I miss a dose? It is important not to miss your dose. Call your care team if you are unable to keep an appointment. What may interact with this medication? Interactions are not expected. This list may not describe all possible interactions. Give your health care provider a list of all the medicines, herbs, non-prescription drugs, or dietary supplements you use. Also tell them if you smoke, drink alcohol, or use illegal drugs. Some items may interact with your medicine. What should I watch for while using this medication? Visit your care team for regular checks on your progress. Tell your care team if your symptoms do not start to get better or if they get worse. Your condition will be monitored carefully while you are receiving this medication. Do not become pregnant while taking this medication or for 7 months after stopping it. Women should inform their care team if they wish to become pregnant or think they might be pregnant. Men should not father a child while taking this medication and for 4 months after stopping it. There is potential for serious side effects to an unborn child. Talk to your care team for more  information. Do not breast-feed an infant while taking this medication or for 7 months after the last dose. This medication has caused decreased sperm counts in some men. This may make it more difficult to father a child. Talk to your care team if you are concerned about your fertility. This medication may increase your risk to bruise or bleed. Call your care team if you notice any unusual bleeding. Be careful brushing or flossing your teeth or using a toothpick because you may get an infection or bleed more easily. If you have any dental work done, tell your dentist you are receiving this medication. This medication may cause dry eyes and blurred vision. If you wear contact lenses, you may feel some discomfort. Lubricating eye drops may help. See your care team if the problem does not go away or is severe. This medication may increase your risk of getting an infection. Call your care team for advice if you get a fever, chills, sore throat, or other symptoms of a cold or flu. Do not treat yourself. Try to avoid being around people who are sick. Avoid taking medications that contain aspirin, acetaminophen, ibuprofen, naproxen, or ketoprofen unless instructed by your care team. These medications  may hide a fever. What side effects may I notice from receiving this medication? Side effects that you should report to your care team as soon as possible: Allergic reactions--skin rash, itching, hives, swelling of the face, lips, tongue, or throat Dry cough, shortness of breath or trouble breathing Infection--fever, chills, cough, sore throat, wounds that don't heal, pain or trouble when passing urine, general feeling of discomfort or being unwell Heart failure--shortness of breath, swelling of the ankles, feet, or hands, sudden weight gain, unusual weakness or fatigue Unusual bruising or bleeding Side effects that usually do not require medical attention (report these to your care team if they continue or are  bothersome): Constipation Diarrhea Hair loss Muscle pain Nausea Vomiting This list may not describe all possible side effects. Call your doctor for medical advice about side effects. You may report side effects to FDA at 1-800-FDA-1088. Where should I keep my medication? This medication is given in a hospital or clinic. It will not be stored at home. NOTE: This sheet is a summary. It may not cover all possible information. If you have questions about this medicine, talk to your doctor, pharmacist, or health care provider.  2023 Elsevier/Gold Standard (2021-09-06 00:00:00)

## 2022-09-14 NOTE — Progress Notes (Signed)
Patient has a minor rash on her shoulder and a potential infection in her right big toe- patient forgot to tell MD at appointment. Dr. Lindi Adie informed and patient was placed on Keflex, but no change in treatment for today.

## 2022-09-14 NOTE — Assessment & Plan Note (Addendum)
1. 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  CT CAP 02/01/2022: Mild progression of disease with increased number and size of the numerous lung nodules in the right lung. Persistent lymphadenopathy and right hilar and right paratracheal nodal areas, numerous hypovascular hepatic lesions slightly larger when compared to previous.  CA 27-29: 226 on 02/01/2022 (gradually progressing)  Plan: [facility] molecular testing:ER positive, ER positive, TMB 5, PD-L1 negative, PIK 3 CA negative -------------------------------------------------------- Current treatment: Enhertu cycle4 Echocardiogram: 06/14/2022: EF 55 to 60%  Chemotoxicities: 1.Nausea 2.fatigue 3.Leukopenia:We once again reduced the dosage today.  Okay to treat with ANC of 1.2 4.Alopecia: Patient is losing significant amount of hair  CT CAP 08/23/2022: Marked interval response to therapy.  Interval resolution of mediastinal and right hilar lymphadenopathy.  Near complete resolution of the nodularity seen previously along the right major and minor fissures.  Interval decrease in the liver metastases.   Okay to treat today with ANC of 1.2 Return to clinic in 3 weeks for [facility]

## 2022-09-14 NOTE — Progress Notes (Signed)
Toenail infection: Sent a antibiotic prescription for Keflex

## 2022-09-15 LAB — CANCER ANTIGEN 27.29: CA 27.29: 112.8 U/mL — ABNORMAL HIGH (ref 0.0–38.6)

## 2022-09-27 ENCOUNTER — Other Ambulatory Visit: Payer: Self-pay

## 2022-09-28 ENCOUNTER — Ambulatory Visit
Admission: RE | Admit: 2022-09-28 | Discharge: 2022-09-28 | Disposition: A | Discharge status: Home / Self Care | Payer: Medicaid Other | Source: Ambulatory Visit | Attending: Hematology and Oncology | Admitting: Hematology and Oncology

## 2022-09-28 DIAGNOSIS — C50911 Malignant neoplasm of unspecified site of right female breast: Secondary | ICD-10-CM | POA: Diagnosis not present

## 2022-09-28 DIAGNOSIS — C50919 Malignant neoplasm of unspecified site of unspecified female breast: Secondary | ICD-10-CM

## 2022-09-30 ENCOUNTER — Other Ambulatory Visit: Payer: Medicaid Other

## 2022-10-01 NOTE — Progress Notes (Signed)
Patient Care Team: [name], MD as PCP - General (Internal Medicine) [name], MD as Attending Physician (Radiation Oncology) [name], [name], NP as Nurse Practitioner (Hematology and Oncology) [name], MD as Consulting Physician (General Surgery) [name], DPM as Consulting Physician (Podiatry) [name], MD as Consulting Physician (Gastroenterology) [name], MD as Attending Physician (Hematology and Oncology)  DIAGNOSIS: No diagnosis found.  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[place])   06/21/2022 - 08/03/2022 Chemotherapy   Patient is on Treatment Plan : BREAST METASTATIC fam-trastuzumab deruxtecan-nxki (Enhertu) q21d     06/21/2022 -  Chemotherapy   Patient is on Treatment Plan : BREAST METASTATIC Fam-Trastuzumab Deruxtecan-nxki (Enhertu) (5.4) q21d     Malignant neoplasm of upper-outer quadrant of right breast in female, estrogen receptor positive ([place])  06/21/2016 Initial Diagnosis   Malignant neoplasm of upper-outer quadrant of right breast in female, estrogen receptor positive ([place])   06/21/2016 Initial Biopsy   Right breast biopsy, 10 oclock: IDC, grade 3, ER+(95%), PR+(80%),Ki67 20%, HER-2 negative (ratio 0.67). Right axilla core biopsy: carcinoma, grade 3, ER+(100%), PR+(90%), Ki67 25%, HER-2 negative (ratio 1.13).    07/17/2016 - 11/06/2016 Neo-Adjuvant Chemotherapy   Received 2 cycles of Doxorubicin and Cyclophosphamide, then transitioned to weekly Paclitaxel (due to repeated febrile neutropenia) x 7 cycles, stopped early due to peripheral neuropathy, then completed her final 2 cycles of Doxorubicin and Cyclophosphamide.    12/19/2016 Surgery   Right breast lumpectomy ([facility]): IDC, grade 2, 1.6cm and 0.3cm, margins negative, 3 SLN positive for metastatic carcinoma.      12/26/2016 Surgery   ALND: metastatic carcinoma in one of 20 lymph nodes, and three nodes from previous lumpectomy.  Four positive nodes, consistent with pN2a.   02/07/2017 - 03/21/2017 Radiation Therapy   Adjuvant radiation Isidore Moos): 1) Right breast and nodes - 4 field: 50 Gy in 25 fractions. IM NODES: >95% receive at least 45Gy/53f. 50Gy to SCLV/PAB @ 2Gy /fraction x 25 fractions. 2) Right breast boost: 10 Gy in 5 fractions   04/2017 -  Anti-estrogen oral therapy   Anastrozole 138mdaily.  Bone density 03/23/2017 finds T score of -2.6, osteoporosis, plan to start Prolia following dental clearance Anastrozole stopped 01/16/18 Exemestane 25 mg daily 02/12/18  On [name], trial randomized to endocrine therapy alone   05/27/2020 Progression   chest CT scan 05/27/2020 shows bulky mediastinal and right hilar lymphadenopathy with right pleural nodules and a small right pleural effusion, no evidence of liver or bone involvement             (a) biopsy of right breast mass 06/02/2020 shows invasive ductal carcinoma, estrogen and progesterone receptor positive, HER-2 not amplified, with an MIB-1 of 40%   06/17/2020 Treatment Plan Change    fulvestrant to start 06/17/2020             (a) palbociclib to start 06/17/2020 at 125 mg daily 21 days on 7 days off             (b) palbociclib decreased to 12573mvery other day x 11 doses on 07/23/2020             (c) palbociclib dose decreased to100 mg daily, 21/7, starting with September cycle             (d) Palbociclib decreased to 51m44mily beginning with March cycle due to  oral ulcers             (e) palbociclib discontinued November 2022 with progression   09/14/2021 PET scan   PET scan 09/14/2021 shows progression in liver and bone   10/05/2021 Pathology Results   liver biopsy 10/05/2021 confirms metastatic carcinoma, estrogen receptor strongly positive, progesterone receptor and HER2 negative, with an MIB-1 of 30%.   10/2021 Treatment Plan Change    fulvestrant continued, Xgeva added, palbociclib changed to abemaciclib November 2022 -Dose Decreased to 50mg PO BID Daily   03/07/2022 Miscellaneous   Caris molecular testing: ER positive, ER positive, MTAP: Detected, ESR 1 negative, BRCA 1 and 2 negative, MSI stable, PD-L1 negative, PIK 3 CA negative, PR negative,   06/21/2022 - 08/03/2022 Chemotherapy   Patient is on Treatment Plan : BREAST METASTATIC fam-trastuzumab deruxtecan-nxki (Enhertu) q21d     06/21/2022 -  Chemotherapy   Patient is on Treatment Plan : BREAST METASTATIC Fam-Trastuzumab Deruxtecan-nxki (Enhertu) (5.4) q21d     Breast cancer, stage 4 (HCC)  03/09/2022 Initial Diagnosis   Breast cancer, stage 4 (HCC)   06/21/2022 - 08/03/2022 Chemotherapy   Patient is on Treatment Plan : BREAST METASTATIC fam-trastuzumab deruxtecan-nxki (Enhertu) q21d     06/21/2022 -  Chemotherapy   Patient is on Treatment Plan : BREAST METASTATIC Fam-Trastuzumab Deruxtecan-nxki (Enhertu) (5.4) q21d       CHIEF COMPLIANT: Follow-up metastatic breast cancer on Enhertu   INTERVAL HISTORY: April [name] is a 61 y.o. with above-mentioned history of ER+ breast cancer. She presents to the clinic today for a follow-up treatment.   ALLERGIES:  is allergic to tape, codeine, cymbalta [duloxetine hcl], hydrocodone, ultram [tramadol hcl], venlafaxine, and gabapentin.  MEDICATIONS:  Current Outpatient Medications  Medication Sig Dispense Refill   azithromycin (ZITHROMAX) 250 MG tablet Take 2 tablets on day 1, then take 1 tablet daily on days 2 -5 6 each 0   cephALEXin (KEFLEX) 500 MG capsule Take 1 capsule (500 mg total) by mouth 2 (two) times daily. 14 capsule 0   Denosumab (XGEVA [place]) Inject 120 mg into the skin every 3 (three) months.     famotidine (PEPCID) 20 MG tablet Take 20 mg by mouth daily as needed for heartburn or indigestion.     loratadine (CLARITIN) 10 MG tablet Take 10 mg by mouth daily.     methylPREDNISolone (MEDROL) 4 MG tablet Take 6 tabs  on day 1, then 5 tabs on day 2, then 4 tabs on day 3, then 3 tabs on day 4, 2 tabs on day 5 and 1 tab on day 6 21 tablet 0   naproxen sodium (ALEVE) 220 MG tablet Take 440-660 mg by mouth 2 (two) times daily as needed (pain).     omeprazole (PRILOSEC) 20 MG capsule Take 20 mg by mouth daily.     rizatriptan (MAXALT) 5 MG tablet Take 1 tablet (5 mg total) by mouth as needed for migraine. May repeat in 2 hours if needed 10 tablet 0   simethicone (MYLICON) 125 MG chewable tablet Chew 125 mg by mouth every 6 (six) hours as needed for flatulence.     valACYclovir (VALTREX) 1000 MG tablet Take 1 tablet (1,000 mg total) by mouth 2 (two) times daily. 1/2 tab BID (Patient taking differently: Take 500 mg by mouth See admin instructions. Take 500 mg daily, may take a second 500 mg dose as needed for outbreaks) 180 tablet 4   No current facility-administered medications for this visit.    PHYSICAL   EXAMINATION: ECOG PERFORMANCE STATUS: {CHL ONC ECOG PS:[id number]}  There were no vitals filed for this visit. There were no vitals filed for this visit.  BREAST:*** No palpable masses or nodules in either right or left breasts. No palpable axillary supraclavicular or infraclavicular adenopathy no breast tenderness or nipple discharge. (exam performed in the presence of a chaperone)  LABORATORY DATA:  I have reviewed the data as listed    Latest Ref Rng & Units 09/14/2022    2:50 PM 08/24/2022    2:22 PM 08/03/2022    2:37 PM  CMP  Glucose 70 - 99 mg/dL 89  114  122   BUN 6 - 20 mg/dL _0 Creatinine 0.44 - 1.00 mg/dL 0.60  0.74  0.71   Sodium 135 - 145 mmol/L 139  139  141   Potassium 3.5 - 5.1 mmol/L 3.1  3.4  3.5   Chloride 98 - 111 mmol/L 107  108  112   CO2 22 - 32 mmol/L _1 Calcium 8.9 - 10.3 mg/dL 9.1  8.9  8.9   Total Protein 6.5 - 8.1 g/dL 6.3  6.3  6.4   Total Bilirubin 0.3 - 1.2 mg/dL 0.4  0.3  0.3   Alkaline Phos 38 - 126 U/L 64  79  72   AST 15 - 41 U/L _2 ALT  0 - 44 U/L _3 Lab Results  Component Value Date   WBC 2.8 (L) 09/14/2022   HGB 11.5 (L) 09/14/2022   HCT 33.1 (L) 09/14/2022   MCV 100.9 (H) 09/14/2022   PLT 231 09/14/2022   NEUTROABS 1.2 (L) 09/14/2022    ASSESSMENT & PLAN:  No problem-specific Assessment & Plan notes found for this encounter.    No orders of the defined types were placed in this encounter.  The patient has a good understanding of the overall plan. she agrees with it. she will call with any problems that may develop before the next visit here. Total time spent: 30 mins including face to face time and time spent for planning, charting and co-ordination of care   Suzzette Righter, [place] 10/01/22    I Gardiner Coins am scribing for Dr. Lindi Adie  ***

## 2022-10-04 MED FILL — Dexamethasone Sodium Phosphate Inj 100 MG/10ML: INTRAMUSCULAR | Qty: 1 | Status: AC

## 2022-10-05 ENCOUNTER — Inpatient Hospital Stay: Payer: Medicaid Other

## 2022-10-05 ENCOUNTER — Inpatient Hospital Stay: Payer: Medicaid Other | Attending: Oncology | Admitting: Hematology and Oncology

## 2022-10-05 VITALS — BP 127/75 | HR 85 | Temp 97.3°F | Resp 18 | Ht 63.0 in | Wt 209.4 lb

## 2022-10-05 VITALS — BP 117/77 | HR 76 | Temp 97.9°F | Resp 18

## 2022-10-05 DIAGNOSIS — C50919 Malignant neoplasm of unspecified site of unspecified female breast: Secondary | ICD-10-CM

## 2022-10-05 DIAGNOSIS — R519 Headache, unspecified: Secondary | ICD-10-CM | POA: Diagnosis not present

## 2022-10-05 DIAGNOSIS — C773 Secondary and unspecified malignant neoplasm of axilla and upper limb lymph nodes: Secondary | ICD-10-CM | POA: Insufficient documentation

## 2022-10-05 DIAGNOSIS — C50411 Malignant neoplasm of upper-outer quadrant of right female breast: Secondary | ICD-10-CM | POA: Diagnosis not present

## 2022-10-05 DIAGNOSIS — Z79899 Other long term (current) drug therapy: Secondary | ICD-10-CM | POA: Insufficient documentation

## 2022-10-05 DIAGNOSIS — C50911 Malignant neoplasm of unspecified site of right female breast: Secondary | ICD-10-CM

## 2022-10-05 DIAGNOSIS — Z5112 Encounter for antineoplastic immunotherapy: Secondary | ICD-10-CM | POA: Diagnosis present

## 2022-10-05 DIAGNOSIS — Z79811 Long term (current) use of aromatase inhibitors: Secondary | ICD-10-CM | POA: Diagnosis not present

## 2022-10-05 DIAGNOSIS — Z170 Estrogen receptor positive status [ER+]: Secondary | ICD-10-CM | POA: Diagnosis not present

## 2022-10-05 DIAGNOSIS — R5383 Other fatigue: Secondary | ICD-10-CM | POA: Insufficient documentation

## 2022-10-05 DIAGNOSIS — Z79624 Long term (current) use of inhibitors of nucleotide synthesis: Secondary | ICD-10-CM | POA: Insufficient documentation

## 2022-10-05 DIAGNOSIS — Z9221 Personal history of antineoplastic chemotherapy: Secondary | ICD-10-CM | POA: Insufficient documentation

## 2022-10-05 DIAGNOSIS — C7951 Secondary malignant neoplasm of bone: Secondary | ICD-10-CM | POA: Insufficient documentation

## 2022-10-05 DIAGNOSIS — R110 Nausea: Secondary | ICD-10-CM | POA: Insufficient documentation

## 2022-10-05 DIAGNOSIS — D72819 Decreased white blood cell count, unspecified: Secondary | ICD-10-CM | POA: Insufficient documentation

## 2022-10-05 DIAGNOSIS — Z923 Personal history of irradiation: Secondary | ICD-10-CM | POA: Diagnosis not present

## 2022-10-05 LAB — CMP (CANCER CENTER ONLY)
ALT: 14 U/L (ref 0–44)
AST: 22 U/L (ref 15–41)
Albumin: 3.5 g/dL (ref 3.5–5.0)
Alkaline Phosphatase: 73 U/L (ref 38–126)
Anion gap: 6 (ref 5–15)
BUN: 12 mg/dL (ref 6–20)
CO2: 27 mmol/L (ref 22–32)
Calcium: 9.3 mg/dL (ref 8.9–10.3)
Chloride: 107 mmol/L (ref 98–111)
Creatinine: 0.76 mg/dL (ref 0.44–1.00)
GFR, Estimated: >60 mL/min (ref >60)
Glucose, Bld: 86 mg/dL (ref 70–99)
Potassium: 4 mmol/L (ref 3.5–5.1)
Sodium: 140 mmol/L (ref 135–145)
Total Bilirubin: 0.3 mg/dL (ref 0.3–1.2)
Total Protein: 6.5 g/dL (ref 6.5–8.1)

## 2022-10-05 LAB — CBC WITH DIFFERENTIAL (CANCER CENTER ONLY)
Abs Immature Granulocytes: 0 10*3/uL (ref 0.00–0.07)
Basophils Absolute: 0 10*3/uL (ref 0.0–0.1)
Basophils Relative: 1 %
Eosinophils Absolute: 0.4 10*3/uL (ref 0.0–0.5)
Eosinophils Relative: 11 %
HCT: 36.2 % (ref 36.0–46.0)
Hemoglobin: 12.6 g/dL (ref 12.0–15.0)
Immature Granulocytes: 0 %
Lymphocytes Relative: 24 %
Lymphs Abs: 0.9 10*3/uL (ref 0.7–4.0)
MCH: 35.8 pg — ABNORMAL HIGH (ref 26.0–34.0)
MCHC: 34.8 g/dL (ref 30.0–36.0)
MCV: 102.8 fL — ABNORMAL HIGH (ref 80.0–100.0)
Monocytes Absolute: 0.4 10*3/uL (ref 0.1–1.0)
Monocytes Relative: 12 %
Neutro Abs: 2 10*3/uL (ref 1.7–7.7)
Neutrophils Relative %: 52 %
Platelet Count: 213 10*3/uL (ref 150–400)
RBC: 3.52 MIL/uL — ABNORMAL LOW (ref 3.87–5.11)
RDW: 15.8 % — ABNORMAL HIGH (ref 11.5–15.5)
WBC Count: 3.7 10*3/uL — ABNORMAL LOW (ref 4.0–10.5)
nRBC: 0 % (ref 0.0–0.2)

## 2022-10-05 MED ORDER — SODIUM CHLORIDE 0.9 % IV SOLN
10.0000 mg | Freq: Once | INTRAVENOUS | Status: AC
  Administered 2022-10-05: 10 mg via INTRAVENOUS
  Filled 2022-10-05: qty 10

## 2022-10-05 MED ORDER — PALONOSETRON HCL INJECTION 0.25 MG/5ML
0.2500 mg | Freq: Once | INTRAVENOUS | Status: AC
  Administered 2022-10-05: 0.25 mg via INTRAVENOUS
  Filled 2022-10-05: qty 5

## 2022-10-05 MED ORDER — FAM-TRASTUZUMAB DERUXTECAN-NXKI CHEMO 100 MG IV SOLR
300.0000 mg | Freq: Once | INTRAVENOUS | Status: AC
  Administered 2022-10-05: 300 mg via INTRAVENOUS
  Filled 2022-10-05: qty 15

## 2022-10-05 MED ORDER — ACETAMINOPHEN 325 MG PO TABS
650.0000 mg | ORAL_TABLET | Freq: Once | ORAL | Status: AC
  Administered 2022-10-05: 650 mg via ORAL
  Filled 2022-10-05: qty 2

## 2022-10-05 MED ORDER — DIPHENHYDRAMINE HCL 25 MG PO CAPS
50.0000 mg | ORAL_CAPSULE | Freq: Once | ORAL | Status: AC
  Administered 2022-10-05: 50 mg via ORAL
  Filled 2022-10-05: qty 2

## 2022-10-05 MED ORDER — SODIUM CHLORIDE 0.9% FLUSH
10.0000 mL | INTRAVENOUS | Status: DC | PRN
  Administered 2022-10-05: 10 mL via INTRAVENOUS

## 2022-10-05 MED ORDER — SODIUM CHLORIDE 0.9% FLUSH
10.0000 mL | INTRAVENOUS | Status: DC | PRN
  Administered 2022-10-05: 10 mL

## 2022-10-05 MED ORDER — DEXTROSE 5 % IV SOLN: Freq: Once | INTRAVENOUS | Status: AC

## 2022-10-05 MED ORDER — HEPARIN SOD (PORK) LOCK FLUSH 100 UNIT/ML IV SOLN
500.0000 [IU] | Freq: Once | INTRAVENOUS | Status: AC | PRN
  Administered 2022-10-05: 500 [IU]

## 2022-10-05 NOTE — Patient Instructions (Signed)
[facility] MEDICAL ONCOLOGY  Discharge Instructions: Thank you for choosing [facility] to provide your oncology and hematology care.   If you have a lab appointment with the Cancer Center, please go directly to the Cancer Center and check in at the registration area.   Wear comfortable clothing and clothing appropriate for easy access to any Portacath or PICC line.   We strive to give you quality time with your provider. You may need to reschedule your appointment if you arrive late (15 or more minutes).  Arriving late affects you and other patients whose appointments are after yours.  Also, if you miss three or more appointments without notifying the office, you may be dismissed from the clinic at the provider's discretion.      For prescription refill requests, have your pharmacy contact our office and allow 72 hours for refills to be completed.    Today you received the following chemotherapy and/or immunotherapy agents: Enhertu      To help prevent nausea and vomiting after your treatment, we encourage you to take your nausea medication as directed.  BELOW ARE SYMPTOMS THAT SHOULD BE REPORTED IMMEDIATELY: *FEVER GREATER THAN 100.4 F (38 C) OR HIGHER *CHILLS OR SWEATING *NAUSEA AND VOMITING THAT IS NOT CONTROLLED WITH YOUR NAUSEA MEDICATION *UNUSUAL SHORTNESS OF BREATH *UNUSUAL BRUISING OR BLEEDING *URINARY PROBLEMS (pain or burning when urinating, or frequent urination) *BOWEL PROBLEMS (unusual diarrhea, constipation, pain near the anus) TENDERNESS IN MOUTH AND THROAT WITH OR WITHOUT PRESENCE OF ULCERS (sore throat, sores in mouth, or a toothache) UNUSUAL RASH, SWELLING OR PAIN  UNUSUAL VAGINAL DISCHARGE OR ITCHING   Items with * indicate a potential emergency and should be followed up as soon as possible or go to the Emergency Department if any problems should occur.  Please show the CHEMOTHERAPY ALERT CARD or IMMUNOTHERAPY ALERT CARD at check-in to  the Emergency Department and triage nurse.  Should you have questions after your visit or need to cancel or reschedule your appointment, please contact [facility]COL[facility]  Dept: [phone]  and follow the prompts.  Office hours are 8:00 a.m. to 4:30 p.m. Monday - Friday. Please note that voicemails left after 4:00 p.m. may not be returned until the following business day.  We are closed weekends and major holidays. You have access to a nurse at all times for urgent questions. Please call the main number to the clinic Dept: [phone] and follow the prompts.   For any non-urgent questions, you may also contact your provider using MyChart. We now offer e-Visits for anyone 18 and older to request care online for non-urgent symptoms. For details visit [email or web address].   Also download the MyChart app! Go to the app store, search "MyChart", open the app, select [facility], and log in with your MyChart username and password.  Masks are optional in the cancer centers. If you would like for your care team to wear a mask while they are taking care of you, please let them know. You may have one support person who is at least 60 years old accompany you for your appointments. 

## 2022-10-05 NOTE — Assessment & Plan Note (Signed)
1.  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 bone metastasis: Currently on Xgeva every 3 months.   CT CAP 02/01/2022: Mild progression of disease with increased number and size of the numerous lung nodules in the right lung.  Persistent lymphadenopathy and right hilar and right paratracheal nodal areas, numerous hypovascular hepatic lesions slightly larger when compared to previous.   CA 27-29: 226 on 02/01/2022 (gradually progressing)   Plan: Caris molecular testing: ER positive, ER positive, TMB 5, PD-L1 negative, PIK 3 CA negative  -------------------------------------------------------- Current treatment: Enhertu cycle 5 Echocardiogram: 06/14/2022: EF 55 to 60%   Chemo toxicities: 1.   Nausea 2. fatigue 3.  Leukopenia: We once again reduced the dosage today.  Okay to treat with ANC of 1.2 4.  Alopecia: Patient is losing significant amount of hair   CT CAP 08/23/2022: Marked interval response to therapy.  Interval resolution of mediastinal and right hilar lymphadenopathy.  Near complete resolution of the nodularity seen previously along the right major and minor fissures.  Interval decrease in the liver metastases.     Okay to treat today with ANC of 1.2 Return to clinic in 3 weeks for Enhertu 

## 2022-10-05 NOTE — Progress Notes (Signed)
Per Lindi Adie MD ok to treat with ECHO from 06/14/22 EF 55-60%

## 2022-10-06 ENCOUNTER — Other Ambulatory Visit: Payer: Self-pay

## 2022-10-06 LAB — CANCER ANTIGEN 27.29: CA 27.29: 70.7 U/mL — ABNORMAL HIGH (ref 0.0–38.6)

## 2022-10-07 ENCOUNTER — Other Ambulatory Visit: Payer: Self-pay

## 2022-10-09 ENCOUNTER — Other Ambulatory Visit: Payer: Self-pay

## 2022-10-17 ENCOUNTER — Other Ambulatory Visit: Payer: Medicaid Other

## 2022-10-23 MED FILL — Dexamethasone Sodium Phosphate Inj 100 MG/10ML: INTRAMUSCULAR | Qty: 1 | Status: AC

## 2022-10-23 NOTE — Progress Notes (Signed)
Patient Care Team: [name], MD as PCP - General (Internal Medicine) [name], MD as Attending Physician (Radiation Oncology) [name], [name], NP as Nurse Practitioner (Hematology and Oncology) [name], MD as Consulting Physician (General Surgery) [name], DPM as Consulting Physician (Podiatry) [name], MD as Consulting Physician (Gastroenterology) [name], MD as Attending Physician (Hematology and Oncology)  DIAGNOSIS: No diagnosis found.  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[place])   06/21/2022 - 08/03/2022 Chemotherapy   Patient is on Treatment Plan : BREAST METASTATIC fam-trastuzumab deruxtecan-nxki (Enhertu) q21d     06/21/2022 -  Chemotherapy   Patient is on Treatment Plan : BREAST METASTATIC Fam-Trastuzumab Deruxtecan-nxki (Enhertu) (5.4) q21d     Malignant neoplasm of upper-outer quadrant of right breast in female, estrogen receptor positive ([place])  06/21/2016 Initial Diagnosis   Malignant neoplasm of upper-outer quadrant of right breast in female, estrogen receptor positive ([place])   06/21/2016 Initial Biopsy   Right breast biopsy, 10 oclock: IDC, grade 3, ER+(95%), PR+(80%),Ki67 20%, HER-2 negative (ratio 0.67). Right axilla core biopsy: carcinoma, grade 3, ER+(100%), PR+(90%), Ki67 25%, HER-2 negative (ratio 1.13).    07/17/2016 - 11/06/2016 Neo-Adjuvant Chemotherapy   Received 2 cycles of Doxorubicin and Cyclophosphamide, then transitioned to weekly Paclitaxel (due to repeated febrile neutropenia) x 7 cycles, stopped early due to peripheral neuropathy, then completed her final 2 cycles of Doxorubicin and Cyclophosphamide.    12/19/2016 Surgery   Right breast lumpectomy ([facility]): IDC, grade 2, 1.6cm and 0.3cm, margins negative, 3 SLN positive for metastatic carcinoma.      12/26/2016 Surgery   ALND: metastatic carcinoma in one of 20 lymph nodes, and three nodes from previous lumpectomy.  Four positive nodes, consistent with pN2a.   02/07/2017 - 03/21/2017 Radiation Therapy   Adjuvant radiation Isidore Moos): 1) Right breast and nodes - 4 field: 50 Gy in 25 fractions. IM NODES: >95% receive at least 45Gy/73f. 50Gy to SCLV/PAB @ 2Gy /fraction x 25 fractions. 2) Right breast boost: 10 Gy in 5 fractions   04/2017 -  Anti-estrogen oral therapy   Anastrozole 155mdaily.  Bone density 03/23/2017 finds T score of -2.6, osteoporosis, plan to start Prolia following dental clearance Anastrozole stopped 01/16/18 Exemestane 25 mg daily 02/12/18  On [name], trial randomized to endocrine therapy alone   05/27/2020 Progression   chest CT scan 05/27/2020 shows bulky mediastinal and right hilar lymphadenopathy with right pleural nodules and a small right pleural effusion, no evidence of liver or bone involvement             (a) biopsy of right breast mass 06/02/2020 shows invasive ductal carcinoma, estrogen and progesterone receptor positive, HER-2 not amplified, with an MIB-1 of 40%   06/17/2020 Treatment Plan Change    fulvestrant to start 06/17/2020             (a) palbociclib to start 06/17/2020 at 125 mg daily 21 days on 7 days off             (b) palbociclib decreased to 12535mvery other day x 11 doses on 07/23/2020             (c) palbociclib dose decreased to100 mg daily, 21/7, starting with September cycle             (d) Palbociclib decreased to 11m65mily beginning with March cycle due to  oral ulcers             (e) palbociclib discontinued November 2022 with progression   09/14/2021 PET scan   PET scan 09/14/2021 shows progression in liver and bone   10/05/2021 Pathology Results   liver biopsy 10/05/2021 confirms metastatic carcinoma, estrogen receptor strongly positive, progesterone receptor and HER2 negative, with an MIB-1 of 30%.   10/2021 Treatment Plan Change    fulvestrant continued, Xgeva added, palbociclib changed to abemaciclib November 2022 -Dose Decreased to 45m PO BID Daily   03/07/2022 Miscellaneous   Caris molecular testing: ER positive, ER positive, MTAP: Detected, ESR 1 negative, BRCA 1 and 2 negative, MSI stable, PD-L1 negative, PIK 3 CA negative, PR negative,   06/21/2022 - 08/03/2022 Chemotherapy   Patient is on Treatment Plan : BREAST METASTATIC fam-trastuzumab deruxtecan-nxki (Enhertu) q21d     06/21/2022 -  Chemotherapy   Patient is on Treatment Plan : BREAST METASTATIC Fam-Trastuzumab Deruxtecan-nxki (Enhertu) (5.4) q21d     [place] 4 [place]  03/09/2022 Initial Diagnosis   [place] cancer, stage 4 ([place]   06/21/2022 - 08/03/2022 Chemotherapy   Patient is on Treatment Plan : BREAST METASTATIC fam-trastuzumab deruxtecan-nxki (Enhertu) q21d     06/21/2022 -  Chemotherapy   Patient is on Treatment Plan : BREAST METASTATIC Fam-Trastuzumab Deruxtecan-nxki (Enhertu) (5.4) q21d       CHIEF COMPLIANT:   INTERVAL HISTORY: April GUNNOEis a   ALLERGIES:  is allergic to tape, codeine, cymbalta [duloxetine hcl], hydrocodone, ultram [tramadol hcl], venlafaxine, and gabapentin.  MEDICATIONS:  Current Outpatient Medications  Medication Sig Dispense Refill   Denosumab (XGEVA [place]) Inject 120 mg into the skin every 3 (three) months.     loratadine (CLARITIN) 10 MG tablet Take 10 mg by mouth daily.     naproxen sodium (ALEVE) 220 MG tablet Take 440-660 mg by mouth 2 (two) times daily as needed (pain).     omeprazole (PRILOSEC) 20 MG capsule Take 20 mg by mouth daily.     rizatriptan (MAXALT) 5 MG tablet Take 1 tablet (5 mg total) by mouth as needed for migraine. May repeat in 2 hours if needed 10 tablet 0   simethicone (MYLICON) 1308MG chewable tablet Chew 125 mg by mouth every 6 (six) hours as needed for flatulence.     valACYclovir (VALTREX) 1000 MG tablet Take 1 tablet (1,000 mg total) by mouth 2 (two) times daily. 1/2 tab BID  (Patient taking differently: Take 500 mg by mouth See admin instructions. Take 500 mg daily, may take a second 500 mg dose as needed for outbreaks) 180 tablet 4   No current facility-administered medications for this visit.    PHYSICAL EXAMINATION: ECOG PERFORMANCE STATUS: {CHL ONC ECOG PS:[id number]}  There were no vitals filed for this visit. There were no vitals filed for this visit.  BREAST:*** No palpable masses or nodules in either right or left breasts. No palpable axillary supraclavicular or infraclavicular adenopathy no breast tenderness or nipple discharge. (exam performed in the presence of a chaperone)  LABORATORY DATA:  I have reviewed the data as listed    Latest Ref Rng & Units 10/05/2022    2:29 PM 09/14/2022    2:50 PM 08/24/2022    2:22 PM  CMP  Glucose 70 - 99 mg/dL 86  89  114   BUN 6 - 20 mg/dL _0 Creatinine 0.44 - 1.00 mg/dL 0.76  0.60  0.74   Sodium 135 -  145 mmol/L 140  139  139   Potassium 3.5 - 5.1 mmol/L 4.0  3.1  3.4   Chloride 98 - 111 mmol/L 107  107  108   CO2 22 - 32 mmol/L _0 Calcium 8.9 - 10.3 mg/dL 9.3  9.1  8.9   Total Protein 6.5 - 8.1 g/dL 6.5  6.3  6.3   Total Bilirubin 0.3 - 1.2 mg/dL 0.3  0.4  0.3   Alkaline Phos 38 - 126 U/L 73  64  79   AST 15 - 41 U/L _1 ALT 0 - 44 U/L _2 Lab Results  Component Value Date   WBC 3.7 (L) 10/05/2022   HGB 12.6 10/05/2022   HCT 36.2 10/05/2022   MCV 102.8 (H) 10/05/2022   PLT 213 10/05/2022   NEUTROABS 2.0 10/05/2022    ASSESSMENT & PLAN:  No problem-specific Assessment & Plan notes found for this encounter.    No orders of the defined types were placed in this encounter.  The patient has a good understanding of the overall plan. she agrees with it. she will call with any problems that may develop before the next visit here. Total time spent: 30 mins including face to face time and time spent for planning, charting and co-ordination of care   Suzzette Righter, [place] 10/23/22    I Gardiner Coins am scribing for Dr. Lindi Adie  ***

## 2022-10-24 ENCOUNTER — Inpatient Hospital Stay (HOSPITAL_BASED_OUTPATIENT_CLINIC_OR_DEPARTMENT_OTHER): Payer: Medicaid Other | Admitting: Hematology and Oncology

## 2022-10-24 ENCOUNTER — Inpatient Hospital Stay: Payer: Medicaid Other

## 2022-10-24 VITALS — BP 131/88 | HR 87 | Temp 97.2°F | Resp 18 | Ht 63.0 in | Wt 206.8 lb

## 2022-10-24 DIAGNOSIS — C50911 Malignant neoplasm of unspecified site of right female breast: Secondary | ICD-10-CM | POA: Diagnosis not present

## 2022-10-24 DIAGNOSIS — Z170 Estrogen receptor positive status [ER+]: Secondary | ICD-10-CM | POA: Diagnosis not present

## 2022-10-24 DIAGNOSIS — Z5112 Encounter for antineoplastic immunotherapy: Secondary | ICD-10-CM | POA: Diagnosis not present

## 2022-10-24 DIAGNOSIS — Z95828 Presence of other vascular implants and grafts: Secondary | ICD-10-CM

## 2022-10-24 DIAGNOSIS — C773 Secondary and unspecified malignant neoplasm of axilla and upper limb lymph nodes: Secondary | ICD-10-CM

## 2022-10-24 DIAGNOSIS — C50411 Malignant neoplasm of upper-outer quadrant of right female breast: Secondary | ICD-10-CM | POA: Diagnosis not present

## 2022-10-24 DIAGNOSIS — C50919 Malignant neoplasm of unspecified site of unspecified female breast: Secondary | ICD-10-CM

## 2022-10-24 LAB — CBC WITH DIFFERENTIAL (CANCER CENTER ONLY)
Abs Immature Granulocytes: 0.01 10*3/uL (ref 0.00–0.07)
Basophils Absolute: 0 10*3/uL (ref 0.0–0.1)
Basophils Relative: 1 %
Eosinophils Absolute: 0.3 10*3/uL (ref 0.0–0.5)
Eosinophils Relative: 10 %
HCT: 36.5 % (ref 36.0–46.0)
Hemoglobin: 12.9 g/dL (ref 12.0–15.0)
Immature Granulocytes: 0 %
Lymphocytes Relative: 24 %
Lymphs Abs: 0.8 10*3/uL (ref 0.7–4.0)
MCH: 36.1 pg — ABNORMAL HIGH (ref 26.0–34.0)
MCHC: 35.3 g/dL (ref 30.0–36.0)
MCV: 102.2 fL — ABNORMAL HIGH (ref 80.0–100.0)
Monocytes Absolute: 0.4 10*3/uL (ref 0.1–1.0)
Monocytes Relative: 12 %
Neutro Abs: 1.7 10*3/uL (ref 1.7–7.7)
Neutrophils Relative %: 53 %
Platelet Count: 257 10*3/uL (ref 150–400)
RBC: 3.57 MIL/uL — ABNORMAL LOW (ref 3.87–5.11)
RDW: 15.3 % (ref 11.5–15.5)
WBC Count: 3.3 10*3/uL — ABNORMAL LOW (ref 4.0–10.5)
nRBC: 0 % (ref 0.0–0.2)

## 2022-10-24 LAB — CMP (CANCER CENTER ONLY)
ALT: 16 U/L (ref 0–44)
AST: 27 U/L (ref 15–41)
Albumin: 3.7 g/dL (ref 3.5–5.0)
Alkaline Phosphatase: 82 U/L (ref 38–126)
Anion gap: 5 (ref 5–15)
BUN: 8 mg/dL (ref 6–20)
CO2: 26 mmol/L (ref 22–32)
Calcium: 9.4 mg/dL (ref 8.9–10.3)
Chloride: 108 mmol/L (ref 98–111)
Creatinine: 0.68 mg/dL (ref 0.44–1.00)
GFR, Estimated: >60 mL/min (ref >60)
Glucose, Bld: 97 mg/dL (ref 70–99)
Potassium: 3.7 mmol/L (ref 3.5–5.1)
Sodium: 139 mmol/L (ref 135–145)
Total Bilirubin: 0.4 mg/dL (ref 0.3–1.2)
Total Protein: 6.6 g/dL (ref 6.5–8.1)

## 2022-10-24 MED ORDER — DEXTROSE 5 % IV SOLN: Freq: Once | INTRAVENOUS | Status: AC

## 2022-10-24 MED ORDER — SODIUM CHLORIDE 0.9% FLUSH
10.0000 mL | INTRAVENOUS | Status: AC | PRN
  Administered 2022-10-24: 10 mL

## 2022-10-24 MED ORDER — HEPARIN SOD (PORK) LOCK FLUSH 100 UNIT/ML IV SOLN
500.0000 [IU] | Freq: Once | INTRAVENOUS | Status: AC | PRN
  Administered 2022-10-24: 500 [IU]

## 2022-10-24 MED ORDER — SODIUM CHLORIDE 0.9% FLUSH
10.0000 mL | INTRAVENOUS | Status: DC | PRN
  Administered 2022-10-24: 10 mL

## 2022-10-24 MED ORDER — DIPHENHYDRAMINE HCL 25 MG PO CAPS
50.0000 mg | ORAL_CAPSULE | Freq: Once | ORAL | Status: AC
  Administered 2022-10-24: 50 mg via ORAL
  Filled 2022-10-24: qty 2

## 2022-10-24 MED ORDER — ACETAMINOPHEN 325 MG PO TABS
650.0000 mg | ORAL_TABLET | Freq: Once | ORAL | Status: AC
  Administered 2022-10-24: 650 mg via ORAL
  Filled 2022-10-24: qty 2

## 2022-10-24 MED ORDER — PALONOSETRON HCL INJECTION 0.25 MG/5ML
0.2500 mg | Freq: Once | INTRAVENOUS | Status: AC
  Administered 2022-10-24: 0.25 mg via INTRAVENOUS
  Filled 2022-10-24: qty 5

## 2022-10-24 MED ORDER — SODIUM CHLORIDE 0.9 % IV SOLN
10.0000 mg | Freq: Once | INTRAVENOUS | Status: AC
  Administered 2022-10-24: 10 mg via INTRAVENOUS
  Filled 2022-10-24: qty 10

## 2022-10-24 MED ORDER — FAM-TRASTUZUMAB DERUXTECAN-NXKI CHEMO 100 MG IV SOLR
300.0000 mg | Freq: Once | INTRAVENOUS | Status: AC
  Administered 2022-10-24: 300 mg via INTRAVENOUS
  Filled 2022-10-24: qty 15

## 2022-10-24 NOTE — Patient Instructions (Signed)
[facility] MEDICAL [facility]COLOGY  Discharge Instructions: Thank you for choosing [facility] to provide your oncology and hematology care.   If you have a lab appointment with the Cancer Center, please go directly to the Cancer Center and check in at the registration area.   Wear comfortable clothing and clothing appropriate for easy access to any Portacath or PICC line.   We strive to give you quality time with your provider. You may need to reschedule your appointment if you arrive late (15 or more minutes).  Arriving late affects you and other patients whose appointments are after yours.  Also, if you miss three or more appointments without notifying the office, you may be dismissed from the clinic at the provider's discretion.      For prescription refill requests, have your pharmacy contact our office and allow 72 hours for refills to be completed.    Today you received the following chemotherapy and/or immunotherapy agents: Enhertu      To help prevent nausea and vomiting after your treatment, we encourage you to take your nausea medication as directed.  BELOW ARE SYMPTOMS THAT SHOULD BE REPORTED IMMEDIATELY: *FEVER GREATER THAN 100.4 F (38 C) OR HIGHER *CHILLS OR SWEATING *NAUSEA AND VOMITING THAT IS NOT CONTROLLED WITH YOUR NAUSEA MEDICATION *UNUSUAL SHORTNESS OF BREATH *UNUSUAL BRUISING OR BLEEDING *URINARY PROBLEMS (pain or burning when urinating, or frequent urination) *BOWEL PROBLEMS (unusual diarrhea, constipation, pain near the anus) TENDERNESS IN MOUTH AND THROAT WITH OR WITHOUT PRESENCE OF ULCERS (sore throat, sores in mouth, or a toothache) UNUSUAL RASH, SWELLING OR PAIN  UNUSUAL VAGINAL DISCHARGE OR ITCHING   Items with * indicate a potential emergency and should be followed up as soon as possible or go to the Emergency Department if any problems should occur.  Please show the CHEMOTHERAPY ALERT CARD or IMMUNOTHERAPY ALERT CARD at check-in to  the Emergency Department and triage nurse.  Should you have questions after your visit or need to cancel or reschedule your appointment, please contact [facility]  Dept: [phone]  and follow the prompts.  Office hours are 8:00 a.m. to 4:30 p.m. Monday - Friday. Please note that voicemails left after 4:00 p.m. may not be returned until the following business day.  We are closed weekends and major holidays. You have access to a nurse at all times for urgent questions. Please call the main number to the clinic Dept: [phone] and follow the prompts.   For any non-urgent questions, you may also contact your provider using MyChart. We now offer e-Visits for anyone 18 and older to request care online for non-urgent symptoms. For details visit [email or web address].   Also download the MyChart app! Go to the app store, search "MyChart", open the app, select [facility], and log in with your MyChart username and password.  Masks are optional in the cancer centers. If you would like for your care team to wear a mask while they are taking care of you, please let them know. You may have one support person who is at least 60 years old accompany you for your appointments. 

## 2022-10-24 NOTE — Progress Notes (Signed)
Ok to treat with ECHO from 06/14/22 per Dr. Lindi Adie.

## 2022-10-24 NOTE — Assessment & Plan Note (Addendum)
1.  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 bone metastasis: Currently on Xgeva every 3 months.   CT CAP 02/01/2022: Mild progression of disease with increased number and size of the numerous lung nodules in the right lung.  Persistent lymphadenopathy and right hilar and right paratracheal nodal areas, numerous hypovascular hepatic lesions slightly larger when compared to previous.   CA 27-29: 226 on 02/01/2022 (gradually progressing)   Plan: [facility] molecular testing: ER positive, ER positive, TMB 5, PD-L1 negative, PIK 3 CA negative  -------------------------------------------------------- Current treatment: Enhertu cycle 7 Echocardiogram: 06/14/2022: EF 55 to 60%   Chemo toxicities: 1.   Nausea: Subsided 2. fatigue: Mild to moderate 3.  Leukopenia: With reduction in dosage the white count has improved 4.  Alopecia: Stable   CT CAP 08/23/2022: Marked interval response to therapy.  Interval resolution of mediastinal and right hilar lymphadenopathy.  Near complete resolution of the nodularity seen previously along the right major and minor fissures.  Interval decrease in the liver metastases.     Return to clinic in 3 weeks for Enhertu with scans done prior to the treatment.

## 2022-10-25 ENCOUNTER — Other Ambulatory Visit: Payer: Self-pay

## 2022-10-25 LAB — CANCER ANTIGEN 27.29: CA 27.29: 54.1 U/mL — ABNORMAL HIGH (ref 0.0–38.6)

## 2022-11-02 ENCOUNTER — Other Ambulatory Visit: Payer: Self-pay

## 2022-11-08 ENCOUNTER — Other Ambulatory Visit: Payer: Self-pay

## 2022-11-10 ENCOUNTER — Telehealth: Payer: Self-pay | Admitting: Hematology and Oncology

## 2022-11-10 NOTE — Telephone Encounter (Signed)
Called patient about r/s appointment times. Patient notified.

## 2022-11-14 ENCOUNTER — Ambulatory Visit (HOSPITAL_COMMUNITY)
Admission: RE | Admit: 2022-11-14 | Discharge: 2022-11-14 | Disposition: A | Discharge status: Home / Self Care | Payer: Medicaid Other | Source: Ambulatory Visit | Attending: Hematology and Oncology | Admitting: Hematology and Oncology

## 2022-11-14 DIAGNOSIS — Z170 Estrogen receptor positive status [ER+]: Secondary | ICD-10-CM | POA: Insufficient documentation

## 2022-11-14 DIAGNOSIS — C787 Secondary malignant neoplasm of liver and intrahepatic bile duct: Secondary | ICD-10-CM | POA: Diagnosis not present

## 2022-11-14 DIAGNOSIS — C773 Secondary and unspecified malignant neoplasm of axilla and upper limb lymph nodes: Secondary | ICD-10-CM | POA: Insufficient documentation

## 2022-11-14 DIAGNOSIS — C50911 Malignant neoplasm of unspecified site of right female breast: Secondary | ICD-10-CM | POA: Diagnosis present

## 2022-11-14 DIAGNOSIS — C50919 Malignant neoplasm of unspecified site of unspecified female breast: Secondary | ICD-10-CM | POA: Diagnosis present

## 2022-11-14 DIAGNOSIS — J984 Other disorders of lung: Secondary | ICD-10-CM | POA: Diagnosis not present

## 2022-11-14 DIAGNOSIS — K769 Liver disease, unspecified: Secondary | ICD-10-CM | POA: Diagnosis not present

## 2022-11-14 DIAGNOSIS — C50411 Malignant neoplasm of upper-outer quadrant of right female breast: Secondary | ICD-10-CM | POA: Insufficient documentation

## 2022-11-14 DIAGNOSIS — J9811 Atelectasis: Secondary | ICD-10-CM | POA: Diagnosis not present

## 2022-11-14 DIAGNOSIS — K8689 Other specified diseases of pancreas: Secondary | ICD-10-CM | POA: Diagnosis not present

## 2022-11-14 DIAGNOSIS — R918 Other nonspecific abnormal finding of lung field: Secondary | ICD-10-CM | POA: Diagnosis not present

## 2022-11-14 DIAGNOSIS — K5790 Diverticulosis of intestine, part unspecified, without perforation or abscess without bleeding: Secondary | ICD-10-CM | POA: Diagnosis not present

## 2022-11-14 MED ORDER — HEPARIN SOD (PORK) LOCK FLUSH 100 UNIT/ML IV SOLN
INTRAVENOUS | Status: AC
  Filled 2022-11-14: qty 5

## 2022-11-14 MED ORDER — HEPARIN SOD (PORK) LOCK FLUSH 100 UNIT/ML IV SOLN
500.0000 [IU] | Freq: Once | INTRAVENOUS | Status: AC
  Administered 2022-11-14: 500 [IU] via INTRAVENOUS

## 2022-11-14 MED ORDER — SODIUM CHLORIDE (PF) 0.9 % IJ SOLN
INTRAMUSCULAR | Status: AC
  Filled 2022-11-14: qty 50

## 2022-11-14 MED ORDER — IOHEXOL 300 MG/ML  SOLN
100.0000 mL | Freq: Once | INTRAMUSCULAR | Status: AC | PRN
  Administered 2022-11-14: 100 mL via INTRAVENOUS

## 2022-11-15 MED FILL — Dexamethasone Sodium Phosphate Inj 100 MG/10ML: INTRAMUSCULAR | Qty: 1 | Status: AC

## 2022-11-15 NOTE — Progress Notes (Signed)
Patient Care Team: [name], MD as PCP - General (Internal Medicine) [name], MD as Attending Physician (Radiation Oncology) [name], [name], NP as Nurse Practitioner (Hematology and Oncology) [name], MD as Consulting Physician (General Surgery) [name], DPM as Consulting Physician (Podiatry) [name], MD as Consulting Physician (Gastroenterology) [name], MD as Attending Physician (Hematology and Oncology)  DIAGNOSIS: No diagnosis found.  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[place])   06/21/2022 - 08/03/2022 Chemotherapy   Patient is on Treatment Plan : BREAST METASTATIC fam-trastuzumab deruxtecan-nxki (Enhertu) q21d     06/21/2022 -  Chemotherapy   Patient is on Treatment Plan : BREAST METASTATIC Fam-Trastuzumab Deruxtecan-nxki (Enhertu) (5.4) q21d     Malignant neoplasm of upper-outer quadrant of right breast in female, estrogen receptor positive ([place])  06/21/2016 Initial Diagnosis   Malignant neoplasm of upper-outer quadrant of right breast in female, estrogen receptor positive ([place])   06/21/2016 Initial Biopsy   Right breast biopsy, 10 oclock: IDC, grade 3, ER+(95%), PR+(80%),Ki67 20%, HER-2 negative (ratio 0.67). Right axilla core biopsy: carcinoma, grade 3, ER+(100%), PR+(90%), Ki67 25%, HER-2 negative (ratio 1.13).    07/17/2016 - 11/06/2016 Neo-Adjuvant Chemotherapy   Received 2 cycles of Doxorubicin and Cyclophosphamide, then transitioned to weekly Paclitaxel (due to repeated febrile neutropenia) x 7 cycles, stopped early due to peripheral neuropathy, then completed her final 2 cycles of Doxorubicin and Cyclophosphamide.    12/19/2016 Surgery   Right breast lumpectomy ([place]): IDC, grade 2, 1.6cm and 0.3cm, margins negative, 3 SLN positive for metastatic carcinoma.      12/26/2016 Surgery   ALND: metastatic carcinoma in one of 20 lymph nodes, and three nodes from previous lumpectomy.  Four positive nodes, consistent with pN2a.   02/07/2017 - 03/21/2017 Radiation Therapy   Adjuvant radiation Isidore Moos): 1) Right breast and nodes - 4 field: 50 Gy in 25 fractions. IM NODES: >95% receive at least 45Gy/73f. 50Gy to SCLV/PAB @ 2Gy /fraction x 25 fractions. 2) Right breast boost: 10 Gy in 5 fractions   04/2017 -  Anti-estrogen oral therapy   Anastrozole 155mdaily.  Bone density 03/23/2017 finds T score of -2.6, osteoporosis, plan to start Prolia following dental clearance Anastrozole stopped 01/16/18 Exemestane 25 mg daily 02/12/18  On [name], trial randomized to endocrine therapy alone   05/27/2020 Progression   chest CT scan 05/27/2020 shows bulky mediastinal and right hilar lymphadenopathy with right pleural nodules and a small right pleural effusion, no evidence of liver or bone involvement             (a) biopsy of right breast mass 06/02/2020 shows invasive ductal carcinoma, estrogen and progesterone receptor positive, HER-2 not amplified, with an MIB-1 of 40%   06/17/2020 Treatment Plan Change    fulvestrant to start 06/17/2020             (a) palbociclib to start 06/17/2020 at 125 mg daily 21 days on 7 days off             (b) palbociclib decreased to 12535mvery other day x 11 doses on 07/23/2020             (c) palbociclib dose decreased to100 mg daily, 21/7, starting with September cycle             (d) Palbociclib decreased to 11m65mily beginning with March cycle due to  oral ulcers             (e) palbociclib discontinued November 2022 with progression   09/14/2021 PET scan   PET scan 09/14/2021 shows progression in liver and bone   10/05/2021 Pathology Results   liver biopsy 10/05/2021 confirms metastatic carcinoma, estrogen receptor strongly positive, progesterone receptor and HER2 negative, with an MIB-1 of 30%.   10/2021 Treatment Plan Change    fulvestrant continued, Xgeva added, palbociclib changed to abemaciclib November 2022 -Dose Decreased to 74m PO BID Daily   03/07/2022 Miscellaneous   [facility] molecular testing: ER positive, ER positive, MTAP: Detected, ESR 1 negative, BRCA 1 and 2 negative, MSI stable, PD-L1 negative, PIK 3 CA negative, PR negative,   06/21/2022 - 08/03/2022 Chemotherapy   Patient is on Treatment Plan : BREAST METASTATIC fam-trastuzumab deruxtecan-nxki (Enhertu) q21d     06/21/2022 -  Chemotherapy   Patient is on Treatment Plan : BREAST METASTATIC Fam-Trastuzumab Deruxtecan-nxki (Enhertu) (5.4) q21d     [place], [place] 4 [place]  03/09/2022 Initial Diagnosis   [place]ast [place], stage 4 ([place]   06/21/2022 - 08/03/2022 Chemotherapy   Patient is on Treatment Plan : BREAST METASTATIC fam-trastuzumab deruxtecan-nxki (Enhertu) q21d     06/21/2022 -  Chemotherapy   Patient is on Treatment Plan : BREAST METASTATIC Fam-Trastuzumab Deruxtecan-nxki (Enhertu) (5.4) q21d       CHIEF COMPLIANT: Follow-up metastatic breast cancer on Enhertu cycle 8  INTERVAL HISTORY: April ARAMBULAis a 61y.o. with above-mentioned history of ER+ breast cancer. Current treatment Cycle 8 Enhertu. She presents to the clinic today for a follow-up and treatment.    ALLERGIES:  is allergic to tape, codeine, cymbalta [duloxetine hcl], hydrocodone, ultram [tramadol hcl], venlafaxine, and gabapentin.  MEDICATIONS:  Current Outpatient Medications  Medication Sig Dispense Refill   Denosumab (XGEVA [place]) Inject 120 mg into the skin every 3 (three) months.     loratadine (CLARITIN) 10 MG tablet Take 10 mg by mouth daily.     naproxen sodium (ALEVE) 220 MG tablet Take 440-660 mg by mouth 2 (two) times daily as needed (pain).     omeprazole (PRILOSEC) 20 MG capsule Take 20 mg by mouth daily.     rizatriptan (MAXALT) 5 MG tablet Take 1 tablet (5 mg total) by mouth as needed for migraine. May repeat in 2 hours if needed 10 tablet 0   simethicone  (MYLICON) 1425MG chewable tablet Chew 125 mg by mouth every 6 (six) hours as needed for flatulence.     valACYclovir (VALTREX) 1000 MG tablet Take 1 tablet (1,000 mg total) by mouth 2 (two) times daily. 1/2 tab BID (Patient taking differently: Take 500 mg by mouth See admin instructions. Take 500 mg daily, may take a second 500 mg dose as needed for outbreaks) 180 tablet 4   No current facility-administered medications for this visit.    PHYSICAL EXAMINATION: ECOG PERFORMANCE STATUS: {CHL ONC ECOG PS:[id number]}  There were no vitals filed for this visit. There were no vitals filed for this visit.  BREAST:*** No palpable masses or nodules in either right or left breasts. No palpable axillary supraclavicular or infraclavicular adenopathy no breast tenderness or nipple discharge. (exam performed in the presence of a chaperone)  LABORATORY DATA:  I have reviewed the data as listed    Latest Ref Rng & Units 10/24/2022    2:05 PM 10/05/2022    2:29 PM 09/14/2022    2:50 PM  CMP  Glucose 70 - 99 mg/dL 97  86  89   BUN 6 - 20 mg/dL _0 Creatinine 0.44 - 1.00 mg/dL 0.68  0.76  0.60   Sodium 135 - 145 mmol/L 139  140  139   Potassium 3.5 - 5.1 mmol/L 3.7  4.0  3.1   Chloride 98 - 111 mmol/L 108  107  107   CO2 22 - 32 mmol/L _1 Calcium 8.9 - 10.3 mg/dL 9.4  9.3  9.1   Total Protein 6.5 - 8.1 g/dL 6.6  6.5  6.3   Total Bilirubin 0.3 - 1.2 mg/dL 0.4  0.3  0.4   Alkaline Phos 38 - 126 U/L 82  73  64   AST 15 - 41 U/L _2 ALT 0 - 44 U/L _3 Lab Results  Component Value Date   WBC 3.3 (L) 10/24/2022   HGB 12.9 10/24/2022   HCT 36.5 10/24/2022   MCV 102.2 (H) 10/24/2022   PLT 257 10/24/2022   NEUTROABS 1.7 10/24/2022    ASSESSMENT & PLAN:  No problem-specific Assessment & Plan notes found for this encounter.    No orders of the defined types were placed in this encounter.  The patient has a good understanding of the overall plan. she agrees  with it. she will call with any problems that may develop before the next visit here. Total time spent: 30 mins including face to face time and time spent for planning, charting and co-ordination of care   Suzzette Righter, [place] 11/15/22    I Gardiner Coins am acting as a Education administrator for [employer]  ***

## 2022-11-16 ENCOUNTER — Inpatient Hospital Stay: Payer: Medicaid Other | Admitting: Hematology and Oncology

## 2022-11-16 ENCOUNTER — Inpatient Hospital Stay: Payer: Medicaid Other

## 2022-11-16 ENCOUNTER — Inpatient Hospital Stay: Payer: Medicaid Other | Attending: Oncology

## 2022-11-16 ENCOUNTER — Other Ambulatory Visit: Payer: Self-pay | Admitting: *Deleted

## 2022-11-16 ENCOUNTER — Inpatient Hospital Stay (HOSPITAL_BASED_OUTPATIENT_CLINIC_OR_DEPARTMENT_OTHER): Payer: Medicaid Other | Admitting: Hematology and Oncology

## 2022-11-16 ENCOUNTER — Other Ambulatory Visit: Payer: Self-pay

## 2022-11-16 VITALS — BP 141/82 | HR 86 | Temp 97.7°F | Resp 18 | Ht 63.0 in | Wt 209.1 lb

## 2022-11-16 DIAGNOSIS — C50911 Malignant neoplasm of unspecified site of right female breast: Secondary | ICD-10-CM

## 2022-11-16 DIAGNOSIS — Z5112 Encounter for antineoplastic immunotherapy: Secondary | ICD-10-CM | POA: Diagnosis present

## 2022-11-16 DIAGNOSIS — R5383 Other fatigue: Secondary | ICD-10-CM | POA: Insufficient documentation

## 2022-11-16 DIAGNOSIS — R519 Headache, unspecified: Secondary | ICD-10-CM | POA: Diagnosis not present

## 2022-11-16 DIAGNOSIS — C50411 Malignant neoplasm of upper-outer quadrant of right female breast: Secondary | ICD-10-CM | POA: Diagnosis not present

## 2022-11-16 DIAGNOSIS — D72819 Decreased white blood cell count, unspecified: Secondary | ICD-10-CM | POA: Insufficient documentation

## 2022-11-16 DIAGNOSIS — C773 Secondary and unspecified malignant neoplasm of axilla and upper limb lymph nodes: Secondary | ICD-10-CM | POA: Diagnosis not present

## 2022-11-16 DIAGNOSIS — Z170 Estrogen receptor positive status [ER+]: Secondary | ICD-10-CM

## 2022-11-16 DIAGNOSIS — C50919 Malignant neoplasm of unspecified site of unspecified female breast: Secondary | ICD-10-CM

## 2022-11-16 DIAGNOSIS — Z79624 Long term (current) use of inhibitors of nucleotide synthesis: Secondary | ICD-10-CM | POA: Insufficient documentation

## 2022-11-16 DIAGNOSIS — C7951 Secondary malignant neoplasm of bone: Secondary | ICD-10-CM | POA: Insufficient documentation

## 2022-11-16 DIAGNOSIS — Z79899 Other long term (current) drug therapy: Secondary | ICD-10-CM | POA: Insufficient documentation

## 2022-11-16 LAB — CMP (CANCER CENTER ONLY)
ALT: 14 U/L (ref 0–44)
AST: 24 U/L (ref 15–41)
Albumin: 3.4 g/dL — ABNORMAL LOW (ref 3.5–5.0)
Alkaline Phosphatase: 79 U/L (ref 38–126)
Anion gap: 6 (ref 5–15)
BUN: 13 mg/dL (ref 6–20)
CO2: 27 mmol/L (ref 22–32)
Calcium: 9.3 mg/dL (ref 8.9–10.3)
Chloride: 107 mmol/L (ref 98–111)
Creatinine: 0.66 mg/dL (ref 0.44–1.00)
GFR, Estimated: >60 mL/min (ref >60)
Glucose, Bld: 95 mg/dL (ref 70–99)
Potassium: 3.9 mmol/L (ref 3.5–5.1)
Sodium: 140 mmol/L (ref 135–145)
Total Bilirubin: 0.3 mg/dL (ref 0.3–1.2)
Total Protein: 5.9 g/dL — ABNORMAL LOW (ref 6.5–8.1)

## 2022-11-16 LAB — CBC WITH DIFFERENTIAL (CANCER CENTER ONLY)
Abs Immature Granulocytes: 0 10*3/uL (ref 0.00–0.07)
Basophils Absolute: 0 10*3/uL (ref 0.0–0.1)
Basophils Relative: 1 %
Eosinophils Absolute: 0.2 10*3/uL (ref 0.0–0.5)
Eosinophils Relative: 5 %
HCT: 35.3 % — ABNORMAL LOW (ref 36.0–46.0)
Hemoglobin: 12.1 g/dL (ref 12.0–15.0)
Immature Granulocytes: 0 %
Lymphocytes Relative: 22 %
Lymphs Abs: 0.7 10*3/uL (ref 0.7–4.0)
MCH: 35.5 pg — ABNORMAL HIGH (ref 26.0–34.0)
MCHC: 34.3 g/dL (ref 30.0–36.0)
MCV: 103.5 fL — ABNORMAL HIGH (ref 80.0–100.0)
Monocytes Absolute: 0.4 10*3/uL (ref 0.1–1.0)
Monocytes Relative: 13 %
Neutro Abs: 2.1 10*3/uL (ref 1.7–7.7)
Neutrophils Relative %: 59 %
Platelet Count: 236 10*3/uL (ref 150–400)
RBC: 3.41 MIL/uL — ABNORMAL LOW (ref 3.87–5.11)
RDW: 15.1 % (ref 11.5–15.5)
WBC Count: 3.4 10*3/uL — ABNORMAL LOW (ref 4.0–10.5)
nRBC: 0 % (ref 0.0–0.2)

## 2022-11-16 MED ORDER — HEPARIN SOD (PORK) LOCK FLUSH 100 UNIT/ML IV SOLN
500.0000 [IU] | Freq: Once | INTRAVENOUS | Status: AC | PRN
  Administered 2022-11-16: 500 [IU]

## 2022-11-16 MED ORDER — ACETAMINOPHEN 325 MG PO TABS
650.0000 mg | ORAL_TABLET | Freq: Once | ORAL | Status: AC
  Administered 2022-11-16: 650 mg via ORAL
  Filled 2022-11-16: qty 2

## 2022-11-16 MED ORDER — SODIUM CHLORIDE 0.9% FLUSH
10.0000 mL | INTRAVENOUS | Status: DC | PRN
  Administered 2022-11-16: 10 mL via INTRAVENOUS

## 2022-11-16 MED ORDER — DEXTROSE 5 % IV SOLN: Freq: Once | INTRAVENOUS | Status: AC

## 2022-11-16 MED ORDER — SODIUM CHLORIDE 0.9% FLUSH
10.0000 mL | INTRAVENOUS | Status: DC | PRN
  Administered 2022-11-16: 10 mL

## 2022-11-16 MED ORDER — FAM-TRASTUZUMAB DERUXTECAN-NXKI CHEMO 100 MG IV SOLR
3.1300 mg/kg | Freq: Once | INTRAVENOUS | Status: AC
  Administered 2022-11-16: 300 mg via INTRAVENOUS
  Filled 2022-11-16: qty 15

## 2022-11-16 MED ORDER — SODIUM CHLORIDE 0.9 % IV SOLN
10.0000 mg | Freq: Once | INTRAVENOUS | Status: AC
  Administered 2022-11-16: 10 mg via INTRAVENOUS
  Filled 2022-11-16: qty 10

## 2022-11-16 MED ORDER — DIPHENHYDRAMINE HCL 25 MG PO CAPS
50.0000 mg | ORAL_CAPSULE | Freq: Once | ORAL | Status: AC
  Administered 2022-11-16: 50 mg via ORAL
  Filled 2022-11-16: qty 2

## 2022-11-16 MED ORDER — DENOSUMAB 120 MG/1.7ML ~~LOC~~ SOLN
120.0000 mg | Freq: Once | SUBCUTANEOUS | Status: AC
  Administered 2022-11-16: 120 mg via SUBCUTANEOUS
  Filled 2022-11-16: qty 1.7

## 2022-11-16 MED ORDER — PALONOSETRON HCL INJECTION 0.25 MG/5ML
0.2500 mg | Freq: Once | INTRAVENOUS | Status: AC
  Administered 2022-11-16: 0.25 mg via INTRAVENOUS
  Filled 2022-11-16: qty 5

## 2022-11-16 NOTE — Progress Notes (Signed)
Per MD okay to treat with echo from 06/14/22.  Verbal orders received to obtain repeat echo in 1-2 weeks.  Orders placed.

## 2022-11-16 NOTE — Assessment & Plan Note (Signed)
1.  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 bone metastasis: Currently on Xgeva every 3 months.   CT CAP 02/01/2022: Mild progression of disease with increased number and size of the numerous lung nodules in the right lung.  Persistent lymphadenopathy and right hilar and right paratracheal nodal areas, numerous hypovascular hepatic lesions slightly larger when compared to previous.   CA 27-29: 226 on 02/01/2022 (gradually progressing)   Plan: [facility] molecular testing: ER positive, ER positive, TMB 5, PD-L1 negative, PIK 3 CA negative  -------------------------------------------------------- Current treatment: Enhertu cycle 8 Echocardiogram: 06/14/2022: EF 55 to 60%   Chemo toxicities: 1.   Nausea: Subsided 2. fatigue: Mild to moderate 3.  Leukopenia: With reduction in dosage the white count has improved 4.  Alopecia: Stable   CT CAP 08/23/2022: Marked interval response to therapy.  Interval resolution of mediastinal and right hilar lymphadenopathy.  Near complete resolution of the nodularity seen previously along the right major and minor fissures.  Interval decrease in the liver metastases.    Return to clinic in 3 weeks for Enhertu with scans done prior to the treatment.

## 2022-11-17 LAB — CANCER ANTIGEN 27.29: CA 27.29: 37.1 U/mL (ref 0.0–38.6)

## 2022-11-23 ENCOUNTER — Other Ambulatory Visit (HOSPITAL_COMMUNITY): Payer: Self-pay | Admitting: Hematology and Oncology

## 2022-11-23 ENCOUNTER — Ambulatory Visit (HOSPITAL_COMMUNITY)
Admission: RE | Admit: 2022-11-23 | Discharge: 2022-11-23 | Disposition: A | Discharge status: Home / Self Care | Payer: Medicaid Other | Source: Ambulatory Visit | Attending: Hematology and Oncology | Admitting: Hematology and Oncology

## 2022-11-23 DIAGNOSIS — C50919 Malignant neoplasm of unspecified site of unspecified female breast: Secondary | ICD-10-CM | POA: Insufficient documentation

## 2022-11-23 DIAGNOSIS — Z79899 Other long term (current) drug therapy: Secondary | ICD-10-CM | POA: Insufficient documentation

## 2022-11-23 DIAGNOSIS — Z0189 Encounter for other specified special examinations: Secondary | ICD-10-CM

## 2022-11-23 DIAGNOSIS — I517 Cardiomegaly: Secondary | ICD-10-CM | POA: Insufficient documentation

## 2022-11-23 DIAGNOSIS — Z01818 Encounter for other preprocedural examination: Secondary | ICD-10-CM | POA: Diagnosis not present

## 2022-11-23 DIAGNOSIS — Z09 Encounter for follow-up examination after completed treatment for conditions other than malignant neoplasm: Secondary | ICD-10-CM

## 2022-11-23 DIAGNOSIS — Z5181 Encounter for therapeutic drug level monitoring: Secondary | ICD-10-CM | POA: Insufficient documentation

## 2022-11-23 LAB — ECHOCARDIOGRAM COMPLETE
Calc EF: 45.7 %
S' Lateral: 3.4 cm
S' Lateral: 2.3 cm
Single Plane A2C EF: 52.7 %
Single Plane A4C EF: 40.7 %

## 2022-11-29 ENCOUNTER — Telehealth: Payer: Self-pay | Admitting: Hematology and Oncology

## 2022-11-29 NOTE — Telephone Encounter (Signed)
Rescheduled appointments per provider on-call. Patient is aware of the changes made to her upcoming appointments.

## 2022-12-06 MED FILL — Dexamethasone Sodium Phosphate Inj 100 MG/10ML: INTRAMUSCULAR | Qty: 1 | Status: AC

## 2022-12-07 ENCOUNTER — Inpatient Hospital Stay: Payer: Medicaid Other | Attending: Oncology

## 2022-12-07 ENCOUNTER — Inpatient Hospital Stay: Payer: Medicaid Other

## 2022-12-07 ENCOUNTER — Inpatient Hospital Stay (HOSPITAL_BASED_OUTPATIENT_CLINIC_OR_DEPARTMENT_OTHER): Payer: Medicaid Other | Admitting: Adult Health

## 2022-12-07 ENCOUNTER — Other Ambulatory Visit: Payer: Self-pay

## 2022-12-07 ENCOUNTER — Inpatient Hospital Stay: Payer: Medicaid Other | Admitting: Hematology and Oncology

## 2022-12-07 VITALS — BP 128/80 | HR 83 | Temp 98.4°F | Resp 17 | Wt 211.2 lb

## 2022-12-07 DIAGNOSIS — Z170 Estrogen receptor positive status [ER+]: Secondary | ICD-10-CM | POA: Diagnosis not present

## 2022-12-07 DIAGNOSIS — M810 Age-related osteoporosis without current pathological fracture: Secondary | ICD-10-CM | POA: Diagnosis not present

## 2022-12-07 DIAGNOSIS — R051 Acute cough: Secondary | ICD-10-CM | POA: Diagnosis not present

## 2022-12-07 DIAGNOSIS — Z5112 Encounter for antineoplastic immunotherapy: Secondary | ICD-10-CM | POA: Diagnosis present

## 2022-12-07 DIAGNOSIS — I8393 Asymptomatic varicose veins of bilateral lower extremities: Secondary | ICD-10-CM | POA: Diagnosis not present

## 2022-12-07 DIAGNOSIS — Z20822 Contact with and (suspected) exposure to covid-19: Secondary | ICD-10-CM | POA: Insufficient documentation

## 2022-12-07 DIAGNOSIS — J069 Acute upper respiratory infection, unspecified: Secondary | ICD-10-CM | POA: Insufficient documentation

## 2022-12-07 DIAGNOSIS — K219 Gastro-esophageal reflux disease without esophagitis: Secondary | ICD-10-CM | POA: Diagnosis not present

## 2022-12-07 DIAGNOSIS — R059 Cough, unspecified: Secondary | ICD-10-CM | POA: Insufficient documentation

## 2022-12-07 DIAGNOSIS — C50411 Malignant neoplasm of upper-outer quadrant of right female breast: Secondary | ICD-10-CM

## 2022-12-07 DIAGNOSIS — Z87891 Personal history of nicotine dependence: Secondary | ICD-10-CM | POA: Insufficient documentation

## 2022-12-07 DIAGNOSIS — C50919 Malignant neoplasm of unspecified site of unspecified female breast: Secondary | ICD-10-CM

## 2022-12-07 DIAGNOSIS — C50911 Malignant neoplasm of unspecified site of right female breast: Secondary | ICD-10-CM | POA: Diagnosis not present

## 2022-12-07 DIAGNOSIS — Z9221 Personal history of antineoplastic chemotherapy: Secondary | ICD-10-CM | POA: Insufficient documentation

## 2022-12-07 DIAGNOSIS — Z79811 Long term (current) use of aromatase inhibitors: Secondary | ICD-10-CM | POA: Diagnosis not present

## 2022-12-07 DIAGNOSIS — C787 Secondary malignant neoplasm of liver and intrahepatic bile duct: Secondary | ICD-10-CM | POA: Insufficient documentation

## 2022-12-07 DIAGNOSIS — C773 Secondary and unspecified malignant neoplasm of axilla and upper limb lymph nodes: Secondary | ICD-10-CM

## 2022-12-07 DIAGNOSIS — G629 Polyneuropathy, unspecified: Secondary | ICD-10-CM | POA: Insufficient documentation

## 2022-12-07 DIAGNOSIS — M797 Fibromyalgia: Secondary | ICD-10-CM | POA: Diagnosis not present

## 2022-12-07 LAB — RESP PANEL BY RT-PCR (RSV, FLU A&B, COVID)  RVPGX2
Influenza A by PCR: NEGATIVE
Influenza B by PCR: NEGATIVE
Resp Syncytial Virus by PCR: NEGATIVE
SARS Coronavirus 2 by RT PCR: NEGATIVE

## 2022-12-07 LAB — CMP (CANCER CENTER ONLY)
ALT: 15 U/L (ref 0–44)
AST: 22 U/L (ref 15–41)
Albumin: 3.6 g/dL (ref 3.5–5.0)
Alkaline Phosphatase: 83 U/L (ref 38–126)
Anion gap: 5 (ref 5–15)
BUN: 9 mg/dL (ref 6–20)
CO2: 26 mmol/L (ref 22–32)
Calcium: 8.8 mg/dL — ABNORMAL LOW (ref 8.9–10.3)
Chloride: 108 mmol/L (ref 98–111)
Creatinine: 0.73 mg/dL (ref 0.44–1.00)
GFR, Estimated: >60 mL/min (ref >60)
Glucose, Bld: 79 mg/dL (ref 70–99)
Potassium: 3.7 mmol/L (ref 3.5–5.1)
Sodium: 139 mmol/L (ref 135–145)
Total Bilirubin: 0.4 mg/dL (ref 0.3–1.2)
Total Protein: 6.3 g/dL — ABNORMAL LOW (ref 6.5–8.1)

## 2022-12-07 LAB — CBC WITH DIFFERENTIAL (CANCER CENTER ONLY)
Abs Immature Granulocytes: 0.01 10*3/uL (ref 0.00–0.07)
Basophils Absolute: 0 10*3/uL (ref 0.0–0.1)
Basophils Relative: 1 %
Eosinophils Absolute: 0.2 10*3/uL (ref 0.0–0.5)
Eosinophils Relative: 4 %
HCT: 37.4 % (ref 36.0–46.0)
Hemoglobin: 12.7 g/dL (ref 12.0–15.0)
Immature Granulocytes: 0 %
Lymphocytes Relative: 22 %
Lymphs Abs: 0.8 10*3/uL (ref 0.7–4.0)
MCH: 34.8 pg — ABNORMAL HIGH (ref 26.0–34.0)
MCHC: 34 g/dL (ref 30.0–36.0)
MCV: 102.5 fL — ABNORMAL HIGH (ref 80.0–100.0)
Monocytes Absolute: 0.5 10*3/uL (ref 0.1–1.0)
Monocytes Relative: 12 %
Neutro Abs: 2.3 10*3/uL (ref 1.7–7.7)
Neutrophils Relative %: 61 %
Platelet Count: 218 10*3/uL (ref 150–400)
RBC: 3.65 MIL/uL — ABNORMAL LOW (ref 3.87–5.11)
RDW: 15.2 % (ref 11.5–15.5)
WBC Count: 3.8 10*3/uL — ABNORMAL LOW (ref 4.0–10.5)
nRBC: 0 % (ref 0.0–0.2)

## 2022-12-07 MED ORDER — SODIUM CHLORIDE 0.9% FLUSH
10.0000 mL | INTRAVENOUS | Status: DC | PRN
  Administered 2022-12-07: 10 mL via INTRAVENOUS

## 2022-12-07 MED ORDER — AZITHROMYCIN 250 MG PO TABS: ORAL_TABLET | ORAL | 0 refills | Status: DC
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
## 2022-12-08 ENCOUNTER — Encounter: Payer: Self-pay | Admitting: Hematology and Oncology

## 2022-12-08 ENCOUNTER — Other Ambulatory Visit: Payer: Self-pay

## 2022-12-08 ENCOUNTER — Encounter: Payer: Self-pay | Admitting: Adult Health

## 2022-12-08 ENCOUNTER — Other Ambulatory Visit: Payer: Self-pay | Admitting: Adult Health

## 2022-12-08 ENCOUNTER — Telehealth: Payer: Self-pay

## 2022-12-08 DIAGNOSIS — C50919 Malignant neoplasm of unspecified site of unspecified female breast: Secondary | ICD-10-CM

## 2022-12-08 LAB — CANCER ANTIGEN 27.29: CA 27.29: 42.5 U/mL — ABNORMAL HIGH (ref 0.0–38.6)

## 2022-12-08 NOTE — Telephone Encounter (Signed)
Duplicate

## 2022-12-08 NOTE — Telephone Encounter (Signed)
-----   Message from Gardenia Phlegm, NP sent at 12/08/2022 11:37 AM EST ----- Will you call and check on patient and make sure she knows I sent in her antibioitics?  ----- Message ----- From: Interface, Lab In [place] Sent: 12/07/2022   4:23 PM EST To: Gardenia Phlegm, NP

## 2022-12-08 NOTE — Telephone Encounter (Signed)
Called Pt and discussed below message. Pt verbalized understanding.

## 2022-12-08 NOTE — Assessment & Plan Note (Signed)
April [name] is a 62 year old woman with history of stage IV breast cancer currently on treatment with Enhertu.  I am concerned about her increased fatigue, slight cough and nasal drainage.  I am concerned it could be viral.  For this reason we will hold her treatment today.  Will obtain viral testing via anterior nasal swab if negative I will send in a Z-Pak considering her CT chest results showed potential inflammation or infection.  I also reached out to Dr. Harl Bowie in cardiology to see how quickly she can get the patient in and to ask her opinion on Dealie's echo in the meantime.  We will reschedule her to next week for labs, follow-up, and her next treatment.

## 2022-12-12 ENCOUNTER — Other Ambulatory Visit: Payer: Self-pay

## 2022-12-14 ENCOUNTER — Other Ambulatory Visit: Payer: Self-pay | Admitting: *Deleted

## 2022-12-14 DIAGNOSIS — C50919 Malignant neoplasm of unspecified site of unspecified female breast: Secondary | ICD-10-CM

## 2022-12-14 MED FILL — Dexamethasone Sodium Phosphate Inj 100 MG/10ML: INTRAMUSCULAR | Qty: 1 | Status: AC

## 2022-12-15 ENCOUNTER — Inpatient Hospital Stay: Payer: Medicaid Other

## 2022-12-15 ENCOUNTER — Inpatient Hospital Stay (HOSPITAL_BASED_OUTPATIENT_CLINIC_OR_DEPARTMENT_OTHER): Payer: Medicaid Other | Admitting: Hematology and Oncology

## 2022-12-15 VITALS — BP 119/87 | HR 95 | Temp 97.9°F | Resp 16 | Wt 212.1 lb

## 2022-12-15 VITALS — BP 110/74 | HR 70 | Resp 17

## 2022-12-15 DIAGNOSIS — C50911 Malignant neoplasm of unspecified site of right female breast: Secondary | ICD-10-CM | POA: Diagnosis not present

## 2022-12-15 DIAGNOSIS — C50919 Malignant neoplasm of unspecified site of unspecified female breast: Secondary | ICD-10-CM | POA: Diagnosis not present

## 2022-12-15 DIAGNOSIS — Z170 Estrogen receptor positive status [ER+]: Secondary | ICD-10-CM

## 2022-12-15 DIAGNOSIS — C773 Secondary and unspecified malignant neoplasm of axilla and upper limb lymph nodes: Secondary | ICD-10-CM | POA: Diagnosis not present

## 2022-12-15 DIAGNOSIS — C50411 Malignant neoplasm of upper-outer quadrant of right female breast: Secondary | ICD-10-CM | POA: Diagnosis not present

## 2022-12-15 DIAGNOSIS — Z95828 Presence of other vascular implants and grafts: Secondary | ICD-10-CM

## 2022-12-15 DIAGNOSIS — Z5112 Encounter for antineoplastic immunotherapy: Secondary | ICD-10-CM | POA: Diagnosis not present

## 2022-12-15 LAB — CBC WITH DIFFERENTIAL (CANCER CENTER ONLY)
Abs Immature Granulocytes: 0.01 10*3/uL (ref 0.00–0.07)
Basophils Absolute: 0 10*3/uL (ref 0.0–0.1)
Basophils Relative: 1 %
Eosinophils Absolute: 0.3 10*3/uL (ref 0.0–0.5)
Eosinophils Relative: 9 %
HCT: 36.1 % (ref 36.0–46.0)
Hemoglobin: 13 g/dL (ref 12.0–15.0)
Immature Granulocytes: 0 %
Lymphocytes Relative: 25 %
Lymphs Abs: 0.9 10*3/uL (ref 0.7–4.0)
MCH: 36.2 pg — ABNORMAL HIGH (ref 26.0–34.0)
MCHC: 36 g/dL (ref 30.0–36.0)
MCV: 100.6 fL — ABNORMAL HIGH (ref 80.0–100.0)
Monocytes Absolute: 0.4 10*3/uL (ref 0.1–1.0)
Monocytes Relative: 12 %
Neutro Abs: 1.9 10*3/uL (ref 1.7–7.7)
Neutrophils Relative %: 53 %
Platelet Count: 213 10*3/uL (ref 150–400)
RBC: 3.59 MIL/uL — ABNORMAL LOW (ref 3.87–5.11)
RDW: 15.4 % (ref 11.5–15.5)
WBC Count: 3.6 10*3/uL — ABNORMAL LOW (ref 4.0–10.5)
nRBC: 0 % (ref 0.0–0.2)

## 2022-12-15 LAB — CMP (CANCER CENTER ONLY)
ALT: 14 U/L (ref 0–44)
AST: 21 U/L (ref 15–41)
Albumin: 3.2 g/dL — ABNORMAL LOW (ref 3.5–5.0)
Alkaline Phosphatase: 76 U/L (ref 38–126)
Anion gap: 5 (ref 5–15)
BUN: 12 mg/dL (ref 6–20)
CO2: 26 mmol/L (ref 22–32)
Calcium: 8.9 mg/dL (ref 8.9–10.3)
Chloride: 107 mmol/L (ref 98–111)
Creatinine: 0.71 mg/dL (ref 0.44–1.00)
GFR, Estimated: >60 mL/min (ref >60)
Glucose, Bld: 100 mg/dL — ABNORMAL HIGH (ref 70–99)
Potassium: 3.7 mmol/L (ref 3.5–5.1)
Sodium: 138 mmol/L (ref 135–145)
Total Bilirubin: 0.4 mg/dL (ref 0.3–1.2)
Total Protein: 6.3 g/dL — ABNORMAL LOW (ref 6.5–8.1)

## 2022-12-15 MED ORDER — ACETAMINOPHEN 325 MG PO TABS
650.0000 mg | ORAL_TABLET | Freq: Once | ORAL | Status: AC
  Administered 2022-12-15: 650 mg via ORAL
  Filled 2022-12-15: qty 2

## 2022-12-15 MED ORDER — DIPHENHYDRAMINE HCL 25 MG PO CAPS
50.0000 mg | ORAL_CAPSULE | Freq: Once | ORAL | Status: AC
  Administered 2022-12-15: 50 mg via ORAL
  Filled 2022-12-15: qty 2

## 2022-12-15 MED ORDER — SODIUM CHLORIDE 0.9% FLUSH
10.0000 mL | INTRAVENOUS | Status: AC | PRN
  Administered 2022-12-15: 10 mL

## 2022-12-15 MED ORDER — SODIUM CHLORIDE 0.9 % IV SOLN
10.0000 mg | Freq: Once | INTRAVENOUS | Status: AC
  Administered 2022-12-15: 10 mg via INTRAVENOUS
  Filled 2022-12-15: qty 10

## 2022-12-15 MED ORDER — PALONOSETRON HCL INJECTION 0.25 MG/5ML
0.2500 mg | Freq: Once | INTRAVENOUS | Status: AC
  Administered 2022-12-15: 0.25 mg via INTRAVENOUS
  Filled 2022-12-15: qty 5

## 2022-12-15 MED ORDER — FAM-TRASTUZUMAB DERUXTECAN-NXKI CHEMO 100 MG IV SOLR
3.2000 mg/kg | Freq: Once | INTRAVENOUS | Status: AC
  Administered 2022-12-15: 300 mg via INTRAVENOUS
  Filled 2022-12-15: qty 15

## 2022-12-15 MED ORDER — DEXTROSE 5 % IV SOLN: Freq: Once | INTRAVENOUS | Status: AC

## 2022-12-15 NOTE — Progress Notes (Signed)
Patient Care Team: [name], MD as PCP - General (Internal Medicine) [name], MD as Attending Physician (Radiation Oncology) [name], [name], NP as Nurse Practitioner (Hematology and Oncology) [name], MD as Consulting Physician (General Surgery) [name], DPM as Consulting Physician (Podiatry) [name], MD as Consulting Physician (Gastroenterology) [name], MD as Attending Physician (Hematology and Oncology)  DIAGNOSIS:  Encounter Diagnoses  Name Primary?   Malignant neoplasm of upper-outer quadrant of right breast in female, estrogen receptor positive ([place]) Yes   Breast cancer metastasized to axillary lymph node, unspecified laterality ([place])    Malignant neoplasm of right breast, stage 4 ([place])     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[place])  06/21/2016 Initial Diagnosis   Breast cancer metastasized to axillary lymph node ([place])   06/21/2022 - 08/03/2022 Chemotherapy   Patient is on Treatment Plan : BREAST METASTATIC fam-trastuzumab deruxtecan-nxki (Enhertu) q21d     06/21/2022 -  Chemotherapy   Patient is on Treatment Plan : BREAST METASTATIC Fam-Trastuzumab Deruxtecan-nxki (Enhertu) (5.4) q21d     Malignant neoplasm of upper-outer quadrant of right breast in female, estrogen receptor positive ([place])  06/21/2016 Initial Diagnosis   Malignant neoplasm of upper-outer quadrant of right breast in female, estrogen receptor positive ([place])   06/21/2016 Initial Biopsy   Right breast biopsy, 10 oclock: IDC, grade 3, ER+(95%), PR+(80%),Ki67 20%, HER-2 negative (ratio 0.67). Right axilla core biopsy: carcinoma, grade 3, ER+(100%), PR+(90%), Ki67 25%, HER-2 negative (ratio 1.13).    07/17/2016 - 11/06/2016 Neo-Adjuvant Chemotherapy   Received 2 cycles of Doxorubicin and Cyclophosphamide, then transitioned to weekly Paclitaxel (due to repeated febrile neutropenia) x 7 cycles, stopped  early due to peripheral neuropathy, then completed her final 2 cycles of Doxorubicin and Cyclophosphamide.    12/19/2016 Surgery   Right breast lumpectomy ([place]): IDC, grade 2, 1.6cm and 0.3cm, margins negative, 3 SLN positive for metastatic carcinoma.     12/26/2016 Surgery   ALND: metastatic carcinoma in one of 20 lymph nodes, and three nodes from previous lumpectomy.  Four positive nodes, consistent with pN2a.   02/07/2017 - 03/21/2017 Radiation Therapy   Adjuvant radiation Isidore Moos): 1) Right breast and nodes - 4 field: 50 Gy in 25 fractions. IM NODES: >95% receive at least 45Gy/67f. 50Gy to SCLV/PAB @ 2Gy /fraction x 25 fractions. 2) Right breast boost: 10 Gy in 5 fractions   04/2017 -  Anti-estrogen oral therapy   Anastrozole '1mg'$  daily.  Bone density 03/23/2017 finds T score of -2.6, osteoporosis, plan to start Prolia following dental clearance Anastrozole stopped 01/16/18 Exemestane 25 mg daily 02/12/18  On [name], trial randomized to endocrine therapy alone   05/27/2020 Progression   chest CT scan 05/27/2020 shows bulky mediastinal and right hilar lymphadenopathy with right pleural nodules and a small right pleural effusion, no evidence of liver or bone involvement             (a) biopsy of right breast mass 06/02/2020 shows invasive ductal carcinoma, estrogen and progesterone receptor positive, HER-2 not amplified, with an MIB-1 of 40%   06/17/2020 Treatment Plan Change    fulvestrant to start 06/17/2020             (a) palbociclib to start 06/17/2020 at 125 mg daily 21 days on 7 days off             (b) palbociclib decreased to '125mg'$  every other day x 11 doses on 07/23/2020             (  c) palbociclib dose decreased to100 mg daily, 21/7, starting with September cycle             (d) Palbociclib decreased to '75mg'$  daily beginning with March cycle due to oral ulcers             (e) palbociclib discontinued November 2022 with progression   09/14/2021 PET scan   PET scan 09/14/2021 shows  progression in liver and bone   10/05/2021 Pathology Results   liver biopsy 10/05/2021 confirms metastatic carcinoma, estrogen receptor strongly positive, progesterone receptor and HER2 negative, with an MIB-1 of 30%.   10/2021 Treatment Plan Change   fulvestrant continued, Xgeva added, palbociclib changed to abemaciclib November 2022 -Dose Decreased to '50mg'$  PO BID Daily   03/07/2022 Miscellaneous   [facility] molecular testing: ER positive, ER positive, MTAP: Detected, ESR 1 negative, BRCA 1 and 2 negative, MSI stable, PD-L1 negative, PIK 3 CA negative, PR negative,   06/21/2022 - 08/03/2022 Chemotherapy   Patient is on Treatment Plan : BREAST METASTATIC fam-trastuzumab deruxtecan-nxki (Enhertu) q21d     06/21/2022 -  Chemotherapy   Patient is on Treatment Plan : BREAST METASTATIC Fam-Trastuzumab Deruxtecan-nxki (Enhertu) (5.4) q21d     Breast cancer, stage 4 ([place])  03/09/2022 Initial Diagnosis   Breast cancer, stage 4 ([place])   06/21/2022 - 08/03/2022 Chemotherapy   Patient is on Treatment Plan : BREAST METASTATIC fam-trastuzumab deruxtecan-nxki (Enhertu) q21d     06/21/2022 -  Chemotherapy   Patient is on Treatment Plan : BREAST METASTATIC Fam-Trastuzumab Deruxtecan-nxki (Enhertu) (5.4) q21d       CHIEF COMPLIANT: Enhertu cycle 9  INTERVAL HISTORY: April [name] is a 62 year old with above-mentioned metastatic breast cancer is currently on Enhertu treatment which is responded very well to the treatment.  She was supposed to have treatment last week but it had to be postponed because of upper respiratory infection.  She was treated with azithromycin and she felt better.  She is here today to receive her ninth cycle of chemotherapy.   ALLERGIES:  is allergic to tape, codeine, cymbalta [duloxetine hcl], hydrocodone, ultram [tramadol hcl], venlafaxine, and gabapentin.  MEDICATIONS:  Current Outpatient Medications  Medication Sig Dispense Refill   azithromycin (ZITHROMAX Z-PAK) 250 MG tablet 2  tab on day 1 then 1 tab daily until complete 6 each 0   Denosumab (XGEVA [name]) Inject 120 mg into the skin every 3 (three) months.     loratadine (CLARITIN) 10 MG tablet Take 10 mg by mouth daily.     naproxen sodium (ALEVE) 220 MG tablet Take 440-660 mg by mouth 2 (two) times daily as needed (pain).     omeprazole (PRILOSEC) 20 MG capsule Take 20 mg by mouth daily. (Patient not taking: Reported on 11/16/2022)     rizatriptan (MAXALT) 5 MG tablet Take 1 tablet (5 mg total) by mouth as needed for migraine. May repeat in 2 hours if needed 10 tablet 0   simethicone (MYLICON) 932 MG chewable tablet Chew 125 mg by mouth every 6 (six) hours as needed for flatulence.     valACYclovir (VALTREX) 1000 MG tablet Take 1 tablet (1,000 mg total) by mouth 2 (two) times daily. 1/2 tab BID (Patient taking differently: Take 500 mg by mouth See admin instructions. Take 500 mg daily, may take a second 500 mg dose as needed for outbreaks) 180 tablet 4   No current facility-administered medications for this visit.    PHYSICAL EXAMINATION: ECOG PERFORMANCE STATUS: 1 - Symptomatic but completely ambulatory  Vitals:   12/15/22 1030  BP: 119/87  Pulse: 95  Resp: 16  Temp: 97.9 F (36.6 C)  SpO2: 98%   Filed Weights   12/15/22 1030  Weight: 212 lb 2 oz (96.2 kg)      LABORATORY DATA:  I have reviewed the data as listed    Latest Ref Rng & Units 12/15/2022   10:04 AM 12/07/2022    1:35 PM 11/16/2022   11:51 AM  CMP  Glucose 70 - 99 mg/dL 100  79  95   BUN 6 - 20 mg/dL '12  9  13   '$ Creatinine 0.44 - 1.00 mg/dL 0.71  0.73  0.66   Sodium 135 - 145 mmol/L 138  139  140   Potassium 3.5 - 5.1 mmol/L 3.7  3.7  3.9   Chloride 98 - 111 mmol/L 107  108  107   CO2 22 - 32 mmol/L '26  26  27   '$ Calcium 8.9 - 10.3 mg/dL 8.9  8.8  9.3   Total Protein 6.5 - 8.1 g/dL 6.3  6.3  5.9   Total Bilirubin 0.3 - 1.2 mg/dL 0.4  0.4  0.3   Alkaline Phos 38 - 126 U/L 76  83  79   AST 15 - 41 U/L '21  22  24   '$ ALT 0 - 44 U/L '14   15  14     '$ Lab Results  Component Value Date   WBC 3.6 (L) 12/15/2022   HGB 13.0 12/15/2022   HCT 36.1 12/15/2022   MCV 100.6 (H) 12/15/2022   PLT 213 12/15/2022   NEUTROABS 1.9 12/15/2022    ASSESSMENT & PLAN:  Malignant neoplasm of upper-outer quadrant of right breast in female, estrogen receptor positive ([place]) 1.  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 bone metastasis: Currently on Xgeva every 3 months.   CT CAP 02/01/2022: Mild progression of disease with increased number and size of the numerous lung nodules in the right lung.  Persistent lymphadenopathy and right hilar and right paratracheal nodal areas, numerous hypovascular hepatic lesions slightly larger when compared to previous.   CA 27-29: 226 on 02/01/2022 (gradually progressing)   Plan: [facility] molecular testing: ER positive, ER positive, TMB 5, PD-L1 negative, PIK 3 CA negative  -------------------------------------------------------- Current treatment: Enhertu cycle 9 Echocardiogram: 06/14/2022: EF 55 to 60%   Chemo toxicities: 1.   Nausea: Subsided 2. fatigue: Mild to moderate 3.  Leukopenia: With reduction in dosage the white count has improved 4.  Alopecia: Stable   CT CAP 08/23/2022: Marked interval response to therapy.  Interval resolution of mediastinal and right hilar lymphadenopathy.  Near complete resolution of the nodularity seen previously along the right major and minor fissures.  Interval decrease in the liver metastases.    Recent upper respiratory infection: Treated with Z-Pak and treatment was held last week.  Still has a slight cough and postnasal drip along with wheezing.  She does not have any fever so therefore we will watch and monitor.  Return to clinic in 3 weeks for Enhertu with scans done prior to the treatment.   No orders of the defined types were placed in this encounter.  The patient has a good understanding of the  overall plan. she agrees with it. she will call with any problems that may develop before the next visit here. Total time spent: 30 mins including face to face time and time spent  for planning, charting and co-ordination of care   Harriette Ohara, MD 12/15/22

## 2022-12-15 NOTE — Progress Notes (Signed)
Per Dr Lindi Adie, ok to proceed with ECHO results from Dec as patient has follow up appt with cardiology already scheduled

## 2022-12-15 NOTE — Assessment & Plan Note (Signed)
1.  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 bone metastasis: Currently on Xgeva every 3 months.   CT CAP 02/01/2022: Mild progression of disease with increased number and size of the numerous lung nodules in the right lung.  Persistent lymphadenopathy and right hilar and right paratracheal nodal areas, numerous hypovascular hepatic lesions slightly larger when compared to previous.   CA 27-29: 226 on 02/01/2022 (gradually progressing)   Plan: [facility] molecular testing: ER positive, ER positive, TMB 5, PD-L1 negative, PIK 3 CA negative  -------------------------------------------------------- Current treatment: Enhertu cycle 9 Echocardiogram: 06/14/2022: EF 55 to 60%   Chemo toxicities: 1.   Nausea: Subsided 2. fatigue: Mild to moderate 3.  Leukopenia: With reduction in dosage the white count has improved 4.  Alopecia: Stable   CT CAP 08/23/2022: Marked interval response to therapy.  Interval resolution of mediastinal and right hilar lymphadenopathy.  Near complete resolution of the nodularity seen previously along the right major and minor fissures.  Interval decrease in the liver metastases.    Recent upper respiratory infection: Treated with Z-Pak and treatment was held last week. Return to clinic in 3 weeks for Enhertu with scans done prior to the treatment.

## 2022-12-16 LAB — CANCER ANTIGEN 27.29: CA 27.29: 34.7 U/mL (ref 0.0–38.6)

## 2022-12-19 ENCOUNTER — Other Ambulatory Visit: Payer: Self-pay | Admitting: *Deleted

## 2022-12-19 ENCOUNTER — Telehealth: Payer: Self-pay | Admitting: *Deleted

## 2022-12-19 MED ORDER — BENZONATATE 200 MG PO CAPS: 200.0000 mg | ORAL_CAPSULE | Freq: Three times a day (TID) | ORAL | 1 refills | Status: DC | PRN

## 2022-12-19 NOTE — Telephone Encounter (Signed)
Received call from pt with complaint of continuous deep non productive cough that is starting to cause throat irritation. Pt states cough is not alleviated by OTC therapies.  RN will review with MD for further recommendations.

## 2022-12-19 NOTE — Progress Notes (Signed)
Per MD pt needing to be prescribed Tessalon Pearls 200 mg p.o TID to help alleviate cough.  Prescription sent to pharmacy on file, pt educated and verbalized understanding.

## 2022-12-25 ENCOUNTER — Encounter: Payer: Self-pay | Admitting: Internal Medicine

## 2022-12-25 ENCOUNTER — Ambulatory Visit: Payer: Medicaid Other | Attending: Internal Medicine | Admitting: Internal Medicine

## 2022-12-25 VITALS — BP 114/74 | HR 77 | Ht 64.0 in | Wt 212.0 lb

## 2022-12-25 DIAGNOSIS — Z5181 Encounter for therapeutic drug level monitoring: Secondary | ICD-10-CM | POA: Insufficient documentation

## 2022-12-25 DIAGNOSIS — Z79899 Other long term (current) drug therapy: Secondary | ICD-10-CM | POA: Diagnosis not present

## 2022-12-25 MED ORDER — CARVEDILOL 3.125 MG PO TABS: 3.1250 mg | ORAL_TABLET | Freq: Two times a day (BID) | ORAL | 3 refills | Status: DC
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
## 2022-12-25 NOTE — Patient Instructions (Signed)
Medication Instructions:  Your physician has recommended you make the following change in your medication:  START: Carvedilol 3.125 twice daily  *If you need a refill on your cardiac medications before your next appointment, please call your pharmacy*   Lab Work: Your physician recommends that you have the following lab drawn today: BNP  If you have labs (blood work) drawn today and your tests are completely normal, you will receive your results only by: [place] (if you have MyChart) OR A paper copy in the mail If you have any lab test that is abnormal or we need to change your treatment, we will call you to review the results.   Testing/Procedures: Your physician recommends that you have a limited echocardiogram performed.    Follow-Up: At [facility], you and your health needs are our priority.  As part of our continuing mission to provide you with exceptional heart care, we have created designated Provider Care Teams.  These Care Teams include your primary Cardiologist (physician) and Advanced Practice Providers (APPs -  Physician Assistants and Nurse Practitioners) who all work together to provide you with the care you need, when you need it.  We recommend signing up for the patient portal called "MyChart".  Sign up information is provided on this After Visit Summary.  MyChart is used to connect with patients for Virtual Visits (Telemedicine).  Patients are able to view lab/test results, encounter notes, upcoming appointments, etc.  Non-urgent messages can be sent to your provider as well.   To learn more about what you can do with MyChart, go to [email or web address].    Your next appointment:   6 month(s)  Provider:   [name], MD

## 2022-12-26 LAB — PRO B NATRIURETIC PEPTIDE: NT-Pro BNP: 62 pg/mL (ref 0–287)

## 2022-12-28 ENCOUNTER — Other Ambulatory Visit: Payer: Medicaid Other

## 2022-12-28 ENCOUNTER — Ambulatory Visit: Payer: Medicaid Other

## 2022-12-28 ENCOUNTER — Ambulatory Visit: Payer: Medicaid Other | Admitting: Hematology and Oncology

## 2023-01-03 MED FILL — Dexamethasone Sodium Phosphate Inj 100 MG/10ML: INTRAMUSCULAR | Qty: 1 | Status: AC

## 2023-01-03 NOTE — Assessment & Plan Note (Signed)
1.  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 bone metastasis: Currently on Xgeva every 3 months.   CT CAP 02/01/2022: Mild progression of disease with increased number and size of the numerous lung nodules in the right lung.  Persistent lymphadenopathy and right hilar and right paratracheal nodal areas, numerous hypovascular hepatic lesions slightly larger when compared to previous.   CA 27-29: 226 on 02/01/2022 (gradually progressing)   Plan: [facility] molecular testing: ER positive, ER positive, TMB 5, PD-L1 negative, PIK 3 CA negative  -------------------------------------------------------- Current treatment: Enhertu cycle 10 Echocardiogram: 06/14/2022: EF 55 to 60%   Chemo toxicities: 1.   Nausea: Subsided 2. fatigue: Mild to moderate 3.  Leukopenia: With reduction in dosage the white count has improved 4.  Alopecia: Stable   CT CAP 08/23/2022: Marked interval response to therapy.  Interval resolution of mediastinal and right hilar lymphadenopathy.  Near complete resolution of the nodularity seen previously along the right major and minor fissures.  Interval decrease in the liver metastases.    Recent upper respiratory infection: Treated with Z-Pak and treatment was held last week.  Still has a slight cough and postnasal drip along with wheezing.  She does not have any fever so therefore we will watch and monitor.   Return to clinic in 3 weeks for Enhertu with scans done prior to the treatment.

## 2023-01-04 ENCOUNTER — Other Ambulatory Visit: Payer: Self-pay

## 2023-01-04 ENCOUNTER — Inpatient Hospital Stay: Payer: Medicaid Other

## 2023-01-04 ENCOUNTER — Inpatient Hospital Stay (HOSPITAL_BASED_OUTPATIENT_CLINIC_OR_DEPARTMENT_OTHER): Payer: Medicaid Other | Admitting: Hematology and Oncology

## 2023-01-04 ENCOUNTER — Inpatient Hospital Stay: Payer: Medicaid Other | Attending: Oncology

## 2023-01-04 VITALS — BP 127/80 | HR 63 | Temp 98.2°F | Resp 20

## 2023-01-04 VITALS — BP 125/90 | HR 79 | Temp 97.3°F | Resp 16 | Wt 216.4 lb

## 2023-01-04 DIAGNOSIS — C7951 Secondary malignant neoplasm of bone: Secondary | ICD-10-CM | POA: Diagnosis not present

## 2023-01-04 DIAGNOSIS — C50919 Malignant neoplasm of unspecified site of unspecified female breast: Secondary | ICD-10-CM

## 2023-01-04 DIAGNOSIS — C773 Secondary and unspecified malignant neoplasm of axilla and upper limb lymph nodes: Secondary | ICD-10-CM | POA: Insufficient documentation

## 2023-01-04 DIAGNOSIS — D72819 Decreased white blood cell count, unspecified: Secondary | ICD-10-CM | POA: Insufficient documentation

## 2023-01-04 DIAGNOSIS — Z923 Personal history of irradiation: Secondary | ICD-10-CM | POA: Diagnosis not present

## 2023-01-04 DIAGNOSIS — Z5112 Encounter for antineoplastic immunotherapy: Secondary | ICD-10-CM | POA: Diagnosis present

## 2023-01-04 DIAGNOSIS — R110 Nausea: Secondary | ICD-10-CM | POA: Insufficient documentation

## 2023-01-04 DIAGNOSIS — R5383 Other fatigue: Secondary | ICD-10-CM | POA: Insufficient documentation

## 2023-01-04 DIAGNOSIS — A6009 Herpesviral infection of other urogenital tract: Secondary | ICD-10-CM | POA: Insufficient documentation

## 2023-01-04 DIAGNOSIS — C50911 Malignant neoplasm of unspecified site of right female breast: Secondary | ICD-10-CM

## 2023-01-04 DIAGNOSIS — Z79899 Other long term (current) drug therapy: Secondary | ICD-10-CM | POA: Diagnosis not present

## 2023-01-04 DIAGNOSIS — Z170 Estrogen receptor positive status [ER+]: Secondary | ICD-10-CM | POA: Diagnosis not present

## 2023-01-04 DIAGNOSIS — Z9221 Personal history of antineoplastic chemotherapy: Secondary | ICD-10-CM | POA: Insufficient documentation

## 2023-01-04 DIAGNOSIS — Z79811 Long term (current) use of aromatase inhibitors: Secondary | ICD-10-CM | POA: Insufficient documentation

## 2023-01-04 DIAGNOSIS — K219 Gastro-esophageal reflux disease without esophagitis: Secondary | ICD-10-CM | POA: Insufficient documentation

## 2023-01-04 DIAGNOSIS — C50411 Malignant neoplasm of upper-outer quadrant of right female breast: Secondary | ICD-10-CM | POA: Insufficient documentation

## 2023-01-04 DIAGNOSIS — C787 Secondary malignant neoplasm of liver and intrahepatic bile duct: Secondary | ICD-10-CM | POA: Insufficient documentation

## 2023-01-04 DIAGNOSIS — Z79624 Long term (current) use of inhibitors of nucleotide synthesis: Secondary | ICD-10-CM | POA: Diagnosis not present

## 2023-01-04 DIAGNOSIS — E7800 Pure hypercholesterolemia, unspecified: Secondary | ICD-10-CM | POA: Diagnosis not present

## 2023-01-04 DIAGNOSIS — M797 Fibromyalgia: Secondary | ICD-10-CM | POA: Insufficient documentation

## 2023-01-04 DIAGNOSIS — R918 Other nonspecific abnormal finding of lung field: Secondary | ICD-10-CM | POA: Insufficient documentation

## 2023-01-04 LAB — CMP (CANCER CENTER ONLY)
ALT: 13 U/L (ref 0–44)
AST: 20 U/L (ref 15–41)
Albumin: 3.3 g/dL — ABNORMAL LOW (ref 3.5–5.0)
Alkaline Phosphatase: 73 U/L (ref 38–126)
Anion gap: 6 (ref 5–15)
BUN: 15 mg/dL (ref 6–20)
CO2: 24 mmol/L (ref 22–32)
Calcium: 8.9 mg/dL (ref 8.9–10.3)
Chloride: 111 mmol/L (ref 98–111)
Creatinine: 0.94 mg/dL (ref 0.44–1.00)
GFR, Estimated: >60 mL/min (ref >60)
Glucose, Bld: 77 mg/dL (ref 70–99)
Potassium: 4.1 mmol/L (ref 3.5–5.1)
Sodium: 141 mmol/L (ref 135–145)
Total Bilirubin: 0.4 mg/dL (ref 0.3–1.2)
Total Protein: 6.4 g/dL — ABNORMAL LOW (ref 6.5–8.1)

## 2023-01-04 LAB — CBC WITH DIFFERENTIAL (CANCER CENTER ONLY)
Abs Immature Granulocytes: 0.01 10*3/uL (ref 0.00–0.07)
Basophils Absolute: 0 10*3/uL (ref 0.0–0.1)
Basophils Relative: 1 %
Eosinophils Absolute: 0.3 10*3/uL (ref 0.0–0.5)
Eosinophils Relative: 9 %
HCT: 35.5 % — ABNORMAL LOW (ref 36.0–46.0)
Hemoglobin: 12.3 g/dL (ref 12.0–15.0)
Immature Granulocytes: 0 %
Lymphocytes Relative: 29 %
Lymphs Abs: 0.9 10*3/uL (ref 0.7–4.0)
MCH: 35.2 pg — ABNORMAL HIGH (ref 26.0–34.0)
MCHC: 34.6 g/dL (ref 30.0–36.0)
MCV: 101.7 fL — ABNORMAL HIGH (ref 80.0–100.0)
Monocytes Absolute: 0.4 10*3/uL (ref 0.1–1.0)
Monocytes Relative: 13 %
Neutro Abs: 1.5 10*3/uL — ABNORMAL LOW (ref 1.7–7.7)
Neutrophils Relative %: 48 %
Platelet Count: 201 10*3/uL (ref 150–400)
RBC: 3.49 MIL/uL — ABNORMAL LOW (ref 3.87–5.11)
RDW: 15.4 % (ref 11.5–15.5)
WBC Count: 3.1 10*3/uL — ABNORMAL LOW (ref 4.0–10.5)
nRBC: 0 % (ref 0.0–0.2)

## 2023-01-04 MED ORDER — PALONOSETRON HCL INJECTION 0.25 MG/5ML
0.2500 mg | Freq: Once | INTRAVENOUS | Status: AC
  Administered 2023-01-04: 0.25 mg via INTRAVENOUS
  Filled 2023-01-04: qty 5

## 2023-01-04 MED ORDER — FAM-TRASTUZUMAB DERUXTECAN-NXKI CHEMO 100 MG IV SOLR
3.2000 mg/kg | Freq: Once | INTRAVENOUS | Status: AC
  Administered 2023-01-04: 300 mg via INTRAVENOUS
  Filled 2023-01-04: qty 15

## 2023-01-04 MED ORDER — HEPARIN SOD (PORK) LOCK FLUSH 100 UNIT/ML IV SOLN
500.0000 [IU] | Freq: Once | INTRAVENOUS | Status: AC | PRN
  Administered 2023-01-04: 500 [IU]

## 2023-01-04 MED ORDER — ACETAMINOPHEN 325 MG PO TABS
650.0000 mg | ORAL_TABLET | Freq: Once | ORAL | Status: AC
  Administered 2023-01-04: 650 mg via ORAL
  Filled 2023-01-04: qty 2

## 2023-01-04 MED ORDER — SODIUM CHLORIDE 0.9% FLUSH
10.0000 mL | INTRAVENOUS | Status: DC | PRN
  Administered 2023-01-04: 10 mL

## 2023-01-04 MED ORDER — DEXTROSE 5 % IV SOLN: Freq: Once | INTRAVENOUS | Status: AC

## 2023-01-04 MED ORDER — SODIUM CHLORIDE 0.9 % IV SOLN
10.0000 mg | Freq: Once | INTRAVENOUS | Status: AC
  Administered 2023-01-04: 10 mg via INTRAVENOUS
  Filled 2023-01-04: qty 10

## 2023-01-04 MED ORDER — DIPHENHYDRAMINE HCL 25 MG PO CAPS
50.0000 mg | ORAL_CAPSULE | Freq: Once | ORAL | Status: AC
  Administered 2023-01-04: 50 mg via ORAL
  Filled 2023-01-04: qty 2

## 2023-01-04 NOTE — Progress Notes (Signed)
Patient Care Team: [name], MD as PCP - General (Internal Medicine) [name], MD as PCP - Cardiology (Cardiology) [name], MD as Attending Physician (Radiation Oncology) [name], [name], NP as Nurse Practitioner (Hematology and Oncology) [name], MD as Consulting Physician (General Surgery) [name], DPM as Consulting Physician (Podiatry) [name], MD as Consulting Physician (Gastroenterology) [name], MD as Attending Physician (Hematology and Oncology)  DIAGNOSIS:  Encounter Diagnoses  Name Primary?   Malignant neoplasm of upper-outer quadrant of right breast in female, estrogen receptor positive ([place]) Yes   Breast cancer metastasized to axillary lymph node, unspecified laterality ([place])    Malignant neoplasm of right breast, stage 4 ([place])     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[place])  06/21/2016 Initial Diagnosis   Breast cancer metastasized to axillary lymph node ([place])   06/21/2022 - 08/03/2022 Chemotherapy   Patient is on Treatment Plan : BREAST METASTATIC fam-trastuzumab deruxtecan-nxki (Enhertu) q21d     06/21/2022 -  Chemotherapy   Patient is on Treatment Plan : BREAST METASTATIC Fam-Trastuzumab Deruxtecan-nxki (Enhertu) (5.4) q21d     Malignant neoplasm of upper-outer quadrant of right breast in female, estrogen receptor positive ([place])  06/21/2016 Initial Diagnosis   Malignant neoplasm of upper-outer quadrant of right breast in female, estrogen receptor positive ([place])   06/21/2016 Initial Biopsy   Right breast biopsy, 10 oclock: IDC, grade 3, ER+(95%), PR+(80%),Ki67 20%, HER-2 negative (ratio 0.67). Right axilla core biopsy: carcinoma, grade 3, ER+(100%), PR+(90%), Ki67 25%, HER-2 negative (ratio 1.13).    07/17/2016 - 11/06/2016 Neo-Adjuvant Chemotherapy   Received 2 cycles of Doxorubicin and Cyclophosphamide, then transitioned to weekly Paclitaxel (due  to repeated febrile neutropenia) x 7 cycles, stopped early due to peripheral neuropathy, then completed her final 2 cycles of Doxorubicin and Cyclophosphamide.    12/19/2016 Surgery   Right breast lumpectomy ([facility]): IDC, grade 2, 1.6cm and 0.3cm, margins negative, 3 SLN positive for metastatic carcinoma.     12/26/2016 Surgery   ALND: metastatic carcinoma in one of 20 lymph nodes, and three nodes from previous lumpectomy.  Four positive nodes, consistent with pN2a.   02/07/2017 - 03/21/2017 Radiation Therapy   Adjuvant radiation Isidore Moos): 1) Right breast and nodes - 4 field: 50 Gy in 25 fractions. IM NODES: >95% receive at least 45Gy/86f. 50Gy to SCLV/PAB @ 2Gy /fraction x 25 fractions. 2) Right breast boost: 10 Gy in 5 fractions   04/2017 -  Anti-estrogen oral therapy   Anastrozole '1mg'$  daily.  Bone density 03/23/2017 finds T score of -2.6, osteoporosis, plan to start Prolia following dental clearance Anastrozole stopped 01/16/18 Exemestane 25 mg daily 02/12/18  On [name], trial randomized to endocrine therapy alone   05/27/2020 Progression   chest CT scan 05/27/2020 shows bulky mediastinal and right hilar lymphadenopathy with right pleural nodules and a small right pleural effusion, no evidence of liver or bone involvement             (a) biopsy of right breast mass 06/02/2020 shows invasive ductal carcinoma, estrogen and progesterone receptor positive, HER-2 not amplified, with an MIB-1 of 40%   06/17/2020 Treatment Plan Change    fulvestrant to start 06/17/2020             (a) palbociclib to start 06/17/2020 at 125 mg daily 21 days on 7 days off             (b) palbociclib decreased to '125mg'$  every  other day x 11 doses on 07/23/2020             (c) palbociclib dose decreased to100 mg daily, 21/7, starting with September cycle             (d) Palbociclib decreased to '75mg'$  daily beginning with March cycle due to oral ulcers             (e) palbociclib discontinued November 2022 with  progression   09/14/2021 PET scan   PET scan 09/14/2021 shows progression in liver and bone   10/05/2021 Pathology Results   liver biopsy 10/05/2021 confirms metastatic carcinoma, estrogen receptor strongly positive, progesterone receptor and HER2 negative, with an MIB-1 of 30%.   10/2021 Treatment Plan Change   fulvestrant continued, Xgeva added, palbociclib changed to abemaciclib November 2022 -Dose Decreased to '50mg'$  PO BID Daily   03/07/2022 Miscellaneous   [facility] molecular testing: ER positive, ER positive, MTAP: Detected, ESR 1 negative, BRCA 1 and 2 negative, MSI stable, PD-L1 negative, PIK 3 CA negative, PR negative,   06/21/2022 - 08/03/2022 Chemotherapy   Patient is on Treatment Plan : BREAST METASTATIC fam-trastuzumab deruxtecan-nxki (Enhertu) q21d     06/21/2022 -  Chemotherapy   Patient is on Treatment Plan : BREAST METASTATIC Fam-Trastuzumab Deruxtecan-nxki (Enhertu) (5.4) q21d     Breast cancer, stage 4 ([place])  03/09/2022 Initial Diagnosis   Breast cancer, stage 4 ([place])   06/21/2022 - 08/03/2022 Chemotherapy   Patient is on Treatment Plan : BREAST METASTATIC fam-trastuzumab deruxtecan-nxki (Enhertu) q21d     06/21/2022 -  Chemotherapy   Patient is on Treatment Plan : BREAST METASTATIC Fam-Trastuzumab Deruxtecan-nxki (Enhertu) (5.4) q21d       CHIEF COMPLIANT:  cycle 10 Enhertu  INTERVAL HISTORY: April [name] is a 62 year old with above-mentioned metastatic breast cancer is currently on Enhertu treatment which is responded very well to the treatment.  She states that she is not nearly as fatigue as before. She says that she does feel like she short of breath sometimes. Overall she is tolerating it well.  ALLERGIES:  is allergic to tape, codeine, cymbalta [duloxetine hcl], hydrocodone, ultram [tramadol hcl], venlafaxine, and gabapentin.  MEDICATIONS:  Current Outpatient Medications  Medication Sig Dispense Refill   benzonatate (TESSALON) 200 MG capsule Take 1 capsule (200  mg total) by mouth 3 (three) times daily as needed for cough. 20 capsule 1   carvedilol (COREG) 3.125 MG tablet Take 1 tablet (3.125 mg total) by mouth 2 (two) times daily. 180 tablet 3   Denosumab (XGEVA [place]) Inject 120 mg into the skin every 3 (three) months.     fam-trastuzumab deruxtecan-nxki (ENHERTU) 100 MG SOLR Inject 100 mg into the vein every 21 ( twenty-one) days.     loratadine (CLARITIN) 10 MG tablet Take 10 mg by mouth daily.     naproxen sodium (ALEVE) 220 MG tablet Take 440-660 mg by mouth 2 (two) times daily as needed (pain).     omeprazole (PRILOSEC) 20 MG capsule Take 20 mg by mouth daily.     ondansetron (ZOFRAN) 8 MG tablet as needed for nausea or vomiting.     rizatriptan (MAXALT) 5 MG tablet Take 1 tablet (5 mg total) by mouth as needed for migraine. May repeat in 2 hours if needed 10 tablet 0   simethicone (MYLICON) 161 MG chewable tablet Chew 125 mg by mouth every 6 (six) hours as needed for flatulence.     valACYclovir (VALTREX) 1000 MG tablet Take 1 tablet (1,000 mg  total) by mouth 2 (two) times daily. 1/2 tab BID (Patient taking differently: Take 500 mg by mouth See admin instructions. Take 500 mg daily, may take a second 500 mg dose as needed for outbreaks) 180 tablet 4   No current facility-administered medications for this visit.   Facility-Administered Medications Ordered in Other Visits  Medication Dose Route Frequency Provider Last Rate Last Admin   sodium chloride flush (NS) 0.9 % injection 10 mL  10 mL Intracatheter PRN [name], MD   10 mL at 01/04/23 1614    PHYSICAL EXAMINATION: ECOG PERFORMANCE STATUS: 1 - Symptomatic but completely ambulatory  Vitals:   01/04/23 1316  BP: (!) 125/90  Pulse: 79  Resp: 16  Temp: (!) 97.3 F (36.3 C)  SpO2: 98%   Filed Weights   01/04/23 1316  Weight: 216 lb 6 oz (98.1 kg)      LABORATORY DATA:  I have reviewed the data as listed    Latest Ref Rng & Units 01/04/2023    1:01 PM 12/15/2022   10:04 AM  12/07/2022    1:35 PM  CMP  Glucose 70 - 99 mg/dL 77  100  79   BUN 6 - 20 mg/dL '15  12  9   '$ Creatinine 0.44 - 1.00 mg/dL 0.94  0.71  0.73   Sodium 135 - 145 mmol/L 141  138  139   Potassium 3.5 - 5.1 mmol/L 4.1  3.7  3.7   Chloride 98 - 111 mmol/L 111  107  108   CO2 22 - 32 mmol/L '24  26  26   '$ Calcium 8.9 - 10.3 mg/dL 8.9  8.9  8.8   Total Protein 6.5 - 8.1 g/dL 6.4  6.3  6.3   Total Bilirubin 0.3 - 1.2 mg/dL 0.4  0.4  0.4   Alkaline Phos 38 - 126 U/L 73  76  83   AST 15 - 41 U/L '20  21  22   '$ ALT 0 - 44 U/L '13  14  15     '$ Lab Results  Component Value Date   WBC 3.1 (L) 01/04/2023   HGB 12.3 01/04/2023   HCT 35.5 (L) 01/04/2023   MCV 101.7 (H) 01/04/2023   PLT 201 01/04/2023   NEUTROABS 1.5 (L) 01/04/2023    ASSESSMENT & PLAN:  Malignant neoplasm of upper-outer quadrant of right breast in female, estrogen receptor positive ([place]) 1.  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 bone metastasis: Currently on Xgeva every 3 months.   CT CAP 02/01/2022: Mild progression of disease with increased number and size of the numerous lung nodules in the right lung.  Persistent lymphadenopathy and right hilar and right paratracheal nodal areas, numerous hypovascular hepatic lesions slightly larger when compared to previous.   CA 27-29: 226 on 02/01/2022 (gradually progressing)   Plan: [facility] molecular testing: ER positive, ER positive, TMB 5, PD-L1 negative, PIK 3 CA negative  -------------------------------------------------------- Current treatment: Enhertu cycle 10 Echocardiogram: 06/14/2022: EF 55 to 60%   Chemo toxicities: 1.   Nausea: Subsided 2. fatigue: Mild to moderate 3.  Leukopenia: Watchful monitoring today's ANC is 1.5 4.  Alopecia: Stable   CT CAP 08/23/2022: Marked interval response to therapy.  Interval resolution of mediastinal and right hilar lymphadenopathy.  Near complete resolution of the nodularity seen  previously along the right major and minor fissures.  Interval decrease in the liver metastases.    Return  to clinic in 3 weeks for Enhertu with scans done prior to the treatment.     No orders of the defined types were placed in this encounter.  The patient has a good understanding of the overall plan. she agrees with it. she will call with any problems that may develop before the next visit here. Total time spent: 30 mins including face to face time and time spent for planning, charting and co-ordination of care   Harriette Ohara, MD 01/04/23    I Gardiner Coins am acting as a Education administrator for [employer]  I have reviewed the above documentation for accuracy and completeness, and I agree with the above.

## 2023-01-04 NOTE — Patient Instructions (Signed)
[facility]  Discharge Instructions: Thank you for choosing [facility] to provide your oncology and hematology care.   If you have a lab appointment with the [facility], please go directly to the [facility] and check in at the registration area.   Wear comfortable clothing and clothing appropriate for easy access to any Portacath or PICC line.   We strive to give you quality time with your provider. You may need to reschedule your appointment if you arrive late (15 or more minutes).  Arriving late affects you and other patients whose appointments are after yours.  Also, if you miss three or more appointments without notifying the office, you may be dismissed from the clinic at the provider's discretion.      For prescription refill requests, have your pharmacy contact our office and allow 72 hours for refills to be completed.    Today you received the following chemotherapy and/or immunotherapy agents: Enhertu      To help prevent nausea and vomiting after your treatment, we encourage you to take your nausea medication as directed.  BELOW ARE SYMPTOMS THAT SHOULD BE REPORTED IMMEDIATELY: *FEVER GREATER THAN 100.4 F (38 C) OR HIGHER *CHILLS OR SWEATING *NAUSEA AND VOMITING THAT IS NOT CONTROLLED WITH YOUR NAUSEA MEDICATION *UNUSUAL SHORTNESS OF BREATH *UNUSUAL BRUISING OR BLEEDING *URINARY PROBLEMS (pain or burning when urinating, or frequent urination) *BOWEL PROBLEMS (unusual diarrhea, constipation, pain near the anus) TENDERNESS IN MOUTH AND THROAT WITH OR WITHOUT PRESENCE OF ULCERS (sore throat, sores in mouth, or a toothache) UNUSUAL RASH, SWELLING OR PAIN  UNUSUAL VAGINAL DISCHARGE OR ITCHING   Items with * indicate a potential emergency and should be followed up as soon as possible or go to the Emergency Department if any problems should occur.  Please show the CHEMOTHERAPY ALERT CARD or IMMUNOTHERAPY ALERT CARD at  check-in to the Emergency Department and triage nurse.  Should you have questions after your visit or need to cancel or reschedule your appointment, please contact [facility]  Dept: [phone]  and follow the prompts.  Office hours are 8:00 a.m. to 4:30 p.m. Monday - Friday. Please note that voicemails left after 4:00 p.m. may not be returned until the following business day.  We are closed weekends and major holidays. You have access to a nurse at all times for urgent questions. Please call the main number to the clinic Dept: [phone] and follow the prompts.   For any non-urgent questions, you may also contact your provider using MyChart. We now offer e-Visits for anyone 20 and older to request care online for non-urgent symptoms. For details visit [email or web address].   Also download the MyChart app! Go to the app store, search "MyChart", open the app, select [facility], and log in with your MyChart username and password.

## 2023-01-08 ENCOUNTER — Other Ambulatory Visit: Payer: Self-pay | Admitting: Adult Health

## 2023-01-08 ENCOUNTER — Other Ambulatory Visit: Payer: Self-pay

## 2023-01-09 MED ORDER — RIZATRIPTAN BENZOATE 5 MG PO TABS: 5.0000 mg | ORAL_TABLET | ORAL | 0 refills | Status: DC | PRN

## 2023-01-17 ENCOUNTER — Ambulatory Visit (HOSPITAL_COMMUNITY): Payer: Medicaid Other | Attending: Internal Medicine

## 2023-01-17 DIAGNOSIS — Z79899 Other long term (current) drug therapy: Secondary | ICD-10-CM | POA: Insufficient documentation

## 2023-01-17 DIAGNOSIS — Z5181 Encounter for therapeutic drug level monitoring: Secondary | ICD-10-CM

## 2023-01-17 LAB — ECHOCARDIOGRAM LIMITED
Area-P 1/2: 4.65 cm2
Est EF: 55
S' Lateral: 2.9 cm

## 2023-01-23 ENCOUNTER — Encounter: Payer: Self-pay | Admitting: Hematology and Oncology

## 2023-01-24 MED FILL — Dexamethasone Sodium Phosphate Inj 100 MG/10ML: INTRAMUSCULAR | Qty: 1 | Status: AC

## 2023-01-25 ENCOUNTER — Inpatient Hospital Stay: Payer: Medicaid Other

## 2023-01-25 ENCOUNTER — Encounter: Payer: Self-pay | Admitting: Adult Health

## 2023-01-25 ENCOUNTER — Inpatient Hospital Stay (HOSPITAL_BASED_OUTPATIENT_CLINIC_OR_DEPARTMENT_OTHER): Payer: Medicaid Other | Admitting: Adult Health

## 2023-01-25 VITALS — BP 133/77 | HR 86 | Temp 98.4°F | Resp 18 | Wt 215.0 lb

## 2023-01-25 DIAGNOSIS — C773 Secondary and unspecified malignant neoplasm of axilla and upper limb lymph nodes: Secondary | ICD-10-CM

## 2023-01-25 DIAGNOSIS — C50919 Malignant neoplasm of unspecified site of unspecified female breast: Secondary | ICD-10-CM

## 2023-01-25 DIAGNOSIS — Z5112 Encounter for antineoplastic immunotherapy: Secondary | ICD-10-CM | POA: Diagnosis not present

## 2023-01-25 DIAGNOSIS — Z170 Estrogen receptor positive status [ER+]: Secondary | ICD-10-CM

## 2023-01-25 DIAGNOSIS — C50411 Malignant neoplasm of upper-outer quadrant of right female breast: Secondary | ICD-10-CM | POA: Diagnosis not present

## 2023-01-25 DIAGNOSIS — C50911 Malignant neoplasm of unspecified site of right female breast: Secondary | ICD-10-CM

## 2023-01-25 LAB — CBC WITH DIFFERENTIAL (CANCER CENTER ONLY)
Abs Immature Granulocytes: 0 10*3/uL (ref 0.00–0.07)
Basophils Absolute: 0 10*3/uL (ref 0.0–0.1)
Basophils Relative: 1 %
Eosinophils Absolute: 0.2 10*3/uL (ref 0.0–0.5)
Eosinophils Relative: 6 %
HCT: 38.2 % (ref 36.0–46.0)
Hemoglobin: 13.4 g/dL (ref 12.0–15.0)
Immature Granulocytes: 0 %
Lymphocytes Relative: 31 %
Lymphs Abs: 1.1 10*3/uL (ref 0.7–4.0)
MCH: 35.4 pg — ABNORMAL HIGH (ref 26.0–34.0)
MCHC: 35.1 g/dL (ref 30.0–36.0)
MCV: 101.1 fL — ABNORMAL HIGH (ref 80.0–100.0)
Monocytes Absolute: 0.5 10*3/uL (ref 0.1–1.0)
Monocytes Relative: 14 %
Neutro Abs: 1.8 10*3/uL (ref 1.7–7.7)
Neutrophils Relative %: 48 %
Platelet Count: 227 10*3/uL (ref 150–400)
RBC: 3.78 MIL/uL — ABNORMAL LOW (ref 3.87–5.11)
RDW: 15.3 % (ref 11.5–15.5)
WBC Count: 3.6 10*3/uL — ABNORMAL LOW (ref 4.0–10.5)
nRBC: 0 % (ref 0.0–0.2)

## 2023-01-25 LAB — CMP (CANCER CENTER ONLY)
ALT: 15 U/L (ref 0–44)
AST: 22 U/L (ref 15–41)
Albumin: 3.7 g/dL (ref 3.5–5.0)
Alkaline Phosphatase: 81 U/L (ref 38–126)
Anion gap: 6 (ref 5–15)
BUN: 14 mg/dL (ref 6–20)
CO2: 26 mmol/L (ref 22–32)
Calcium: 8.7 mg/dL — ABNORMAL LOW (ref 8.9–10.3)
Chloride: 106 mmol/L (ref 98–111)
Creatinine: 0.75 mg/dL (ref 0.44–1.00)
GFR, Estimated: >60 mL/min (ref >60)
Glucose, Bld: 93 mg/dL (ref 70–99)
Potassium: 3.8 mmol/L (ref 3.5–5.1)
Sodium: 138 mmol/L (ref 135–145)
Total Bilirubin: 0.4 mg/dL (ref 0.3–1.2)
Total Protein: 6.8 g/dL (ref 6.5–8.1)

## 2023-01-25 MED ORDER — ACETAMINOPHEN 325 MG PO TABS
650.0000 mg | ORAL_TABLET | Freq: Once | ORAL | Status: AC
  Administered 2023-01-25: 650 mg via ORAL
  Filled 2023-01-25: qty 2

## 2023-01-25 MED ORDER — PALONOSETRON HCL INJECTION 0.25 MG/5ML
0.2500 mg | Freq: Once | INTRAVENOUS | Status: AC
  Administered 2023-01-25: 0.25 mg via INTRAVENOUS
  Filled 2023-01-25: qty 5

## 2023-01-25 MED ORDER — DIPHENHYDRAMINE HCL 25 MG PO CAPS
50.0000 mg | ORAL_CAPSULE | Freq: Once | ORAL | Status: AC
  Administered 2023-01-25: 50 mg via ORAL
  Filled 2023-01-25: qty 2

## 2023-01-25 MED ORDER — SODIUM CHLORIDE 0.9 % IV SOLN
10.0000 mg | Freq: Once | INTRAVENOUS | Status: AC
  Administered 2023-01-25: 10 mg via INTRAVENOUS
  Filled 2023-01-25: qty 10

## 2023-01-25 MED ORDER — DEXTROSE 5 % IV SOLN: Freq: Once | INTRAVENOUS | Status: AC

## 2023-01-25 MED ORDER — SODIUM CHLORIDE 0.9% FLUSH
10.0000 mL | INTRAVENOUS | Status: DC | PRN
  Administered 2023-01-25: 10 mL

## 2023-01-25 MED ORDER — HEPARIN SOD (PORK) LOCK FLUSH 100 UNIT/ML IV SOLN
500.0000 [IU] | Freq: Once | INTRAVENOUS | Status: AC | PRN
  Administered 2023-01-25: 500 [IU]

## 2023-01-25 MED ORDER — FAM-TRASTUZUMAB DERUXTECAN-NXKI CHEMO 100 MG IV SOLR
3.2000 mg/kg | Freq: Once | INTRAVENOUS | Status: AC
  Administered 2023-01-25: 300 mg via INTRAVENOUS
  Filled 2023-01-25: qty 15

## 2023-01-25 MED ORDER — SODIUM CHLORIDE 0.9% FLUSH
10.0000 mL | INTRAVENOUS | Status: DC | PRN
  Administered 2023-01-25: 10 mL via INTRAVENOUS

## 2023-01-25 NOTE — Patient Instructions (Signed)
[facility]  Discharge Instructions: Thank you for choosing [facility] to provide your oncology and hematology care.   If you have a lab appointment with the Cancer Center, please go directly to the Cancer Center and check in at the registration area.   Wear comfortable clothing and clothing appropriate for easy access to any Portacath or PICC line.   We strive to give you quality time with your provider. You may need to reschedule your appointment if you arrive late (15 or more minutes).  Arriving late affects you and other patients whose appointments are after yours.  Also, if you miss three or more appointments without notifying the office, you may be dismissed from the clinic at the provider's discretion.      For prescription refill requests, have your pharmacy contact our office and allow 72 hours for refills to be completed.    Today you received the following chemotherapy and/or immunotherapy agents: Enhertu      To help prevent nausea and vomiting after your treatment, we encourage you to take your nausea medication as directed.  BELOW ARE SYMPTOMS THAT SHOULD BE REPORTED IMMEDIATELY: *FEVER GREATER THAN 100.4 F (38 C) OR HIGHER *CHILLS OR SWEATING *NAUSEA AND VOMITING THAT IS NOT CONTROLLED WITH YOUR NAUSEA MEDICATION *UNUSUAL SHORTNESS OF BREATH *UNUSUAL BRUISING OR BLEEDING *URINARY PROBLEMS (pain or burning when urinating, or frequent urination) *BOWEL PROBLEMS (unusual diarrhea, constipation, pain near the anus) TENDERNESS IN MOUTH AND THROAT WITH OR WITHOUT PRESENCE OF ULCERS (sore throat, sores in mouth, or a toothache) UNUSUAL RASH, SWELLING OR PAIN  UNUSUAL VAGINAL DISCHARGE OR ITCHING   Items with * indicate a potential emergency and should be followed up as soon as possible or go to the Emergency Department if any problems should occur.  Please show the CHEMOTHERAPY ALERT CARD or IMMUNOTHERAPY ALERT CARD at  check-in to the Emergency Department and triage nurse.  Should you have questions after your visit or need to cancel or reschedule your appointment, please contact [facility]  Dept: [phone]  and follow the prompts.  Office hours are 8:00 a.m. to 4:30 p.m. Monday - Friday. Please note that voicemails left after 4:00 p.m. may not be returned until the following business day.  We are closed weekends and major holidays. You have access to a nurse at all times for urgent questions. Please call the main number to the clinic Dept: [phone] and follow the prompts.   For any non-urgent questions, you may also contact your provider using MyChart. We now offer e-Visits for anyone 18 and older to request care online for non-urgent symptoms. For details visit [email or web address].   Also download the MyChart app! Go to the app store, search "MyChart", open the app, select [facility], and log in with your MyChart username and password.  

## 2023-01-25 NOTE — Assessment & Plan Note (Signed)
April [name] is a 62 year old woman with metastatic breast cancer currently on treatment with Enhertu since July 2023.  She continues to tolerate this treatment well and has no clinical or radiographic signs of breast cancer progression.  Her most recent restaging scans occurred in December 2023.  I have ordered restaging scans from March 7 a week prior to when her next cycle of Enhertu is due.  She will continue to undergo echocardiograms every 3 months and see Dr. Harl Bowie.  I am happy to know that she is feeling much better since starting the Coreg.  [name] will return in 3 weeks for labs, follow-up, and her next appointment.
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
## 2023-01-26 ENCOUNTER — Other Ambulatory Visit: Payer: Self-pay

## 2023-01-26 LAB — CANCER ANTIGEN 27.29: CA 27.29: 39.5 U/mL — ABNORMAL HIGH (ref 0.0–38.6)

## 2023-01-28 ENCOUNTER — Other Ambulatory Visit: Payer: Self-pay

## 2023-01-29 ENCOUNTER — Encounter: Payer: Self-pay | Admitting: Hematology and Oncology

## 2023-02-09 NOTE — Progress Notes (Signed)
Patient Care Team: [name], MD as PCP - General (Internal Medicine) [name], MD as PCP - Cardiology (Cardiology) [name], MD as Attending Physician (Radiation Oncology) [name], [name], NP as Nurse Practitioner (Hematology and Oncology) [name], MD as Consulting Physician (General Surgery) [name], DPM as Consulting Physician (Podiatry) [name], MD as Consulting Physician (Gastroenterology) [name], MD as Attending Physician (Hematology and Oncology)  DIAGNOSIS: No diagnosis found.  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[place])   06/21/2022 - 08/03/2022 Chemotherapy   Patient is on Treatment Plan : BREAST METASTATIC fam-trastuzumab deruxtecan-nxki (Enhertu) q21d     06/21/2022 -  Chemotherapy   Patient is on Treatment Plan : BREAST METASTATIC Fam-Trastuzumab Deruxtecan-nxki (Enhertu) (5.4) q21d     Malignant neoplasm of upper-outer quadrant of right breast in female, estrogen receptor positive ([place])  06/21/2016 Initial Diagnosis   Malignant neoplasm of upper-outer quadrant of right breast in female, estrogen receptor positive ([place])   06/21/2016 Initial Biopsy   Right breast biopsy, 10 oclock: IDC, grade 3, ER+(95%), PR+(80%),Ki67 20%, HER-2 negative (ratio 0.67). Right axilla core biopsy: carcinoma, grade 3, ER+(100%), PR+(90%), Ki67 25%, HER-2 negative (ratio 1.13).    07/17/2016 - 11/06/2016 Neo-Adjuvant Chemotherapy   Received 2 cycles of Doxorubicin and Cyclophosphamide, then transitioned to weekly Paclitaxel (due to repeated febrile neutropenia) x 7 cycles, stopped early due to peripheral neuropathy, then completed her final 2 cycles of Doxorubicin and Cyclophosphamide.    12/19/2016 Surgery   Right breast lumpectomy ([facility]): IDC, grade 2, 1.6cm and 0.3cm, margins  negative, 3 SLN positive for metastatic carcinoma.     12/26/2016 Surgery   ALND: metastatic carcinoma in one of 20 lymph nodes, and three nodes from previous lumpectomy.  Four positive nodes, consistent with pN2a.   02/07/2017 - 03/21/2017 Radiation Therapy   Adjuvant radiation Isidore Moos): 1) Right breast and nodes - 4 field: 50 Gy in 25 fractions. IM NODES: >95% receive at least 45Gy/9f. 50Gy to SCLV/PAB @ 2Gy /fraction x 25 fractions. 2) Right breast boost: 10 Gy in 5 fractions   04/2017 -  Anti-estrogen oral therapy   Anastrozole '1mg'$  daily.  Bone density 03/23/2017 finds T score of -2.6, osteoporosis, plan to start Prolia following dental clearance Anastrozole stopped 01/16/18 Exemestane 25 mg daily 02/12/18  On [name], trial randomized to endocrine therapy alone   05/27/2020 Progression   chest CT scan 05/27/2020 shows bulky mediastinal and right hilar lymphadenopathy with right pleural nodules and a small right pleural effusion, no evidence of liver or bone involvement             (a) biopsy of right breast mass 06/02/2020 shows invasive ductal carcinoma, estrogen and progesterone receptor positive, HER-2 not amplified, with an MIB-1 of 40%   06/17/2020 Treatment Plan Change    fulvestrant to start 06/17/2020             (a) palbociclib to start 06/17/2020 at 125 mg daily 21 days on 7 days off             (b) palbociclib decreased to '125mg'$  every other day x 11 doses on 07/23/2020             (c) palbociclib dose decreased to100 mg daily, 21/7, starting with September cycle             (d) Palbociclib decreased  to '75mg'$  daily beginning with March cycle due to oral ulcers             (e) palbociclib discontinued November 2022 with progression   09/14/2021 PET scan   PET scan 09/14/2021 shows progression in liver and bone   10/05/2021 Pathology Results   liver biopsy 10/05/2021 confirms metastatic carcinoma, estrogen receptor strongly positive, progesterone receptor and HER2 negative, with an  MIB-1 of 30%.   10/2021 Treatment Plan Change   fulvestrant continued, Xgeva added, palbociclib changed to abemaciclib November 2022 -Dose Decreased to '50mg'$  PO BID Daily   03/07/2022 Miscellaneous   [facility] molecular testing: ER positive, ER positive, MTAP: Detected, ESR 1 negative, BRCA 1 and 2 negative, MSI stable, PD-L1 negative, PIK 3 CA negative, PR negative,   06/21/2022 - 08/03/2022 Chemotherapy   Patient is on Treatment Plan : BREAST METASTATIC fam-trastuzumab deruxtecan-nxki (Enhertu) q21d     06/21/2022 -  Chemotherapy   Patient is on Treatment Plan : BREAST METASTATIC Fam-Trastuzumab Deruxtecan-nxki (Enhertu) (5.4) q21d     01/25/2023 Cancer Staging   Staging form: Breast, AJCC 7th Edition - Pathologic: Stage IV (M1) - Signed by Gardenia Phlegm, NP on 01/25/2023   Breast cancer, stage 4 ([place])  03/09/2022 Initial Diagnosis   Breast cancer, stage 4 ([place])   06/21/2022 - 08/03/2022 Chemotherapy   Patient is on Treatment Plan : BREAST METASTATIC fam-trastuzumab deruxtecan-nxki (Enhertu) q21d     06/21/2022 -  Chemotherapy   Patient is on Treatment Plan : BREAST METASTATIC Fam-Trastuzumab Deruxtecan-nxki (Enhertu) (5.4) q21d       CHIEF COMPLIANT: Follow-up metastatic breast cancer/ Enhertu  INTERVAL HISTORY: April [name] is a 62 year old with above-mentioned metastatic breast cancer is currently on Enhertu treatment    ALLERGIES:  is allergic to tape, codeine, cymbalta [duloxetine hcl], hydrocodone, ultram [tramadol hcl], venlafaxine, and gabapentin.  MEDICATIONS:  Current Outpatient Medications  Medication Sig Dispense Refill   benzonatate (TESSALON) 200 MG capsule Take 1 capsule (200 mg total) by mouth 3 (three) times daily as needed for cough. 20 capsule 1   carvedilol (COREG) 3.125 MG tablet Take 1 tablet (3.125 mg total) by mouth 2 (two) times daily. 180 tablet 3   Denosumab (XGEVA [place]) Inject 120 mg into the skin every 3 (three) months.     fam-trastuzumab  deruxtecan-nxki (ENHERTU) 100 MG SOLR Inject 100 mg into the vein every 21 ( twenty-one) days.     loratadine (CLARITIN) 10 MG tablet Take 10 mg by mouth daily.     naproxen sodium (ALEVE) 220 MG tablet Take 440-660 mg by mouth 2 (two) times daily as needed (pain).     omeprazole (PRILOSEC) 20 MG capsule Take 20 mg by mouth daily.     ondansetron (ZOFRAN) 8 MG tablet as needed for nausea or vomiting.     rizatriptan (MAXALT) 5 MG tablet Take 1 tablet (5 mg total) by mouth as needed for migraine. May repeat in 2 hours if needed 10 tablet 0   simethicone (MYLICON) 0000000 MG chewable tablet Chew 125 mg by mouth every 6 (six) hours as needed for flatulence.     valACYclovir (VALTREX) 1000 MG tablet Take 1 tablet (1,000 mg total) by mouth 2 (two) times daily. 1/2 tab BID (Patient taking differently: Take 500 mg by mouth See admin instructions. Take 500 mg daily, may take a second 500 mg dose as needed for outbreaks) 180 tablet 4   No current facility-administered medications for this visit.    PHYSICAL EXAMINATION: ECOG  PERFORMANCE STATUS: {CHL ONC ECOG PS:[id number]}  There were no vitals filed for this visit. There were no vitals filed for this visit.  BREAST:*** No palpable masses or nodules in either right or left breasts. No palpable axillary supraclavicular or infraclavicular adenopathy no breast tenderness or nipple discharge. (exam performed in the presence of a chaperone)  LABORATORY DATA:  I have reviewed the data as listed    Latest Ref Rng & Units 01/25/2023    1:42 PM 01/04/2023    1:01 PM 12/15/2022   10:04 AM  CMP  Glucose 70 - 99 mg/dL 93  77  100   BUN 6 - 20 mg/dL '14  15  12   '$ Creatinine 0.44 - 1.00 mg/dL 0.75  0.94  0.71   Sodium 135 - 145 mmol/L 138  141  138   Potassium 3.5 - 5.1 mmol/L 3.8  4.1  3.7   Chloride 98 - 111 mmol/L 106  111  107   CO2 22 - 32 mmol/L '26  24  26   '$ Calcium 8.9 - 10.3 mg/dL 8.7  8.9  8.9   Total Protein 6.5 - 8.1 g/dL 6.8  6.4  6.3   Total  Bilirubin 0.3 - 1.2 mg/dL 0.4  0.4  0.4   Alkaline Phos 38 - 126 U/L 81  73  76   AST 15 - 41 U/L '22  20  21   '$ ALT 0 - 44 U/L '15  13  14     '$ Lab Results  Component Value Date   WBC 3.6 (L) 01/25/2023   HGB 13.4 01/25/2023   HCT 38.2 01/25/2023   MCV 101.1 (H) 01/25/2023   PLT 227 01/25/2023   NEUTROABS 1.8 01/25/2023    ASSESSMENT & PLAN:  No problem-specific Assessment & Plan notes found for this encounter.    No orders of the defined types were placed in this encounter.  The patient has a good understanding of the overall plan. she agrees with it. she will call with any problems that may develop before the next visit here. Total time spent: 30 mins including face to face time and time spent for planning, charting and co-ordination of care   Suzzette Righter, [place] 02/09/23    I Gardiner Coins am acting as a Education administrator for [employer]  ***

## 2023-02-13 ENCOUNTER — Ambulatory Visit (HOSPITAL_COMMUNITY)
Admission: RE | Admit: 2023-02-13 | Discharge: 2023-02-13 | Disposition: A | Discharge status: Home / Self Care | Payer: Medicaid Other | Source: Ambulatory Visit | Attending: Adult Health | Admitting: Adult Health

## 2023-02-13 DIAGNOSIS — C50911 Malignant neoplasm of unspecified site of right female breast: Secondary | ICD-10-CM | POA: Diagnosis present

## 2023-02-13 DIAGNOSIS — C773 Secondary and unspecified malignant neoplasm of axilla and upper limb lymph nodes: Secondary | ICD-10-CM | POA: Diagnosis present

## 2023-02-13 DIAGNOSIS — Z170 Estrogen receptor positive status [ER+]: Secondary | ICD-10-CM | POA: Diagnosis present

## 2023-02-13 DIAGNOSIS — C50919 Malignant neoplasm of unspecified site of unspecified female breast: Secondary | ICD-10-CM | POA: Insufficient documentation

## 2023-02-13 DIAGNOSIS — R918 Other nonspecific abnormal finding of lung field: Secondary | ICD-10-CM | POA: Diagnosis not present

## 2023-02-13 DIAGNOSIS — C50411 Malignant neoplasm of upper-outer quadrant of right female breast: Secondary | ICD-10-CM | POA: Diagnosis present

## 2023-02-13 DIAGNOSIS — K769 Liver disease, unspecified: Secondary | ICD-10-CM | POA: Diagnosis not present

## 2023-02-13 MED ORDER — HEPARIN SOD (PORK) LOCK FLUSH 100 UNIT/ML IV SOLN
500.0000 [IU] | Freq: Once | INTRAVENOUS | Status: AC
  Administered 2023-02-13: 500 [IU] via INTRAVENOUS

## 2023-02-13 MED ORDER — IOHEXOL 300 MG/ML  SOLN
100.0000 mL | Freq: Once | INTRAMUSCULAR | Status: AC | PRN
  Administered 2023-02-13: 100 mL via INTRAVENOUS

## 2023-02-14 MED FILL — Dexamethasone Sodium Phosphate Inj 100 MG/10ML: INTRAMUSCULAR | Qty: 1 | Status: AC

## 2023-02-15 ENCOUNTER — Inpatient Hospital Stay: Payer: Medicaid Other

## 2023-02-15 ENCOUNTER — Inpatient Hospital Stay: Payer: Medicaid Other | Attending: Oncology

## 2023-02-15 ENCOUNTER — Inpatient Hospital Stay (HOSPITAL_BASED_OUTPATIENT_CLINIC_OR_DEPARTMENT_OTHER): Payer: Medicaid Other | Admitting: Hematology and Oncology

## 2023-02-15 VITALS — BP 130/83 | HR 80 | Temp 97.2°F | Resp 17 | Ht 64.0 in | Wt 213.7 lb

## 2023-02-15 VITALS — BP 116/77 | HR 75 | Temp 97.9°F | Resp 18

## 2023-02-15 DIAGNOSIS — Z5112 Encounter for antineoplastic immunotherapy: Secondary | ICD-10-CM | POA: Insufficient documentation

## 2023-02-15 DIAGNOSIS — R112 Nausea with vomiting, unspecified: Secondary | ICD-10-CM | POA: Insufficient documentation

## 2023-02-15 DIAGNOSIS — C50919 Malignant neoplasm of unspecified site of unspecified female breast: Secondary | ICD-10-CM

## 2023-02-15 DIAGNOSIS — Z170 Estrogen receptor positive status [ER+]: Secondary | ICD-10-CM

## 2023-02-15 DIAGNOSIS — C773 Secondary and unspecified malignant neoplasm of axilla and upper limb lymph nodes: Secondary | ICD-10-CM | POA: Insufficient documentation

## 2023-02-15 DIAGNOSIS — Z79899 Other long term (current) drug therapy: Secondary | ICD-10-CM | POA: Diagnosis not present

## 2023-02-15 DIAGNOSIS — C787 Secondary malignant neoplasm of liver and intrahepatic bile duct: Secondary | ICD-10-CM | POA: Diagnosis not present

## 2023-02-15 DIAGNOSIS — Z79811 Long term (current) use of aromatase inhibitors: Secondary | ICD-10-CM | POA: Insufficient documentation

## 2023-02-15 DIAGNOSIS — C50411 Malignant neoplasm of upper-outer quadrant of right female breast: Secondary | ICD-10-CM | POA: Insufficient documentation

## 2023-02-15 DIAGNOSIS — D72819 Decreased white blood cell count, unspecified: Secondary | ICD-10-CM | POA: Insufficient documentation

## 2023-02-15 DIAGNOSIS — C50911 Malignant neoplasm of unspecified site of right female breast: Secondary | ICD-10-CM

## 2023-02-15 DIAGNOSIS — R5383 Other fatigue: Secondary | ICD-10-CM | POA: Insufficient documentation

## 2023-02-15 DIAGNOSIS — Z79624 Long term (current) use of inhibitors of nucleotide synthesis: Secondary | ICD-10-CM | POA: Diagnosis not present

## 2023-02-15 LAB — CBC WITH DIFFERENTIAL (CANCER CENTER ONLY)
Abs Immature Granulocytes: 0 10*3/uL (ref 0.00–0.07)
Basophils Absolute: 0 10*3/uL (ref 0.0–0.1)
Basophils Relative: 1 %
Eosinophils Absolute: 0.2 10*3/uL (ref 0.0–0.5)
Eosinophils Relative: 5 %
HCT: 37.3 % (ref 36.0–46.0)
Hemoglobin: 13.1 g/dL (ref 12.0–15.0)
Immature Granulocytes: 0 %
Lymphocytes Relative: 29 %
Lymphs Abs: 0.9 10*3/uL (ref 0.7–4.0)
MCH: 35.5 pg — ABNORMAL HIGH (ref 26.0–34.0)
MCHC: 35.1 g/dL (ref 30.0–36.0)
MCV: 101.1 fL — ABNORMAL HIGH (ref 80.0–100.0)
Monocytes Absolute: 0.5 10*3/uL (ref 0.1–1.0)
Monocytes Relative: 16 %
Neutro Abs: 1.5 10*3/uL — ABNORMAL LOW (ref 1.7–7.7)
Neutrophils Relative %: 49 %
Platelet Count: 221 10*3/uL (ref 150–400)
RBC: 3.69 MIL/uL — ABNORMAL LOW (ref 3.87–5.11)
RDW: 15.3 % (ref 11.5–15.5)
WBC Count: 3.1 10*3/uL — ABNORMAL LOW (ref 4.0–10.5)
nRBC: 0 % (ref 0.0–0.2)

## 2023-02-15 LAB — CMP (CANCER CENTER ONLY)
ALT: 15 U/L (ref 0–44)
AST: 24 U/L (ref 15–41)
Albumin: 3.7 g/dL (ref 3.5–5.0)
Alkaline Phosphatase: 84 U/L (ref 38–126)
Anion gap: 4 — ABNORMAL LOW (ref 5–15)
BUN: 13 mg/dL (ref 6–20)
CO2: 28 mmol/L (ref 22–32)
Calcium: 9.2 mg/dL (ref 8.9–10.3)
Chloride: 107 mmol/L (ref 98–111)
Creatinine: 0.83 mg/dL (ref 0.44–1.00)
GFR, Estimated: >60 mL/min (ref >60)
Glucose, Bld: 96 mg/dL (ref 70–99)
Potassium: 4 mmol/L (ref 3.5–5.1)
Sodium: 139 mmol/L (ref 135–145)
Total Bilirubin: 0.4 mg/dL (ref 0.3–1.2)
Total Protein: 6.9 g/dL (ref 6.5–8.1)

## 2023-02-15 MED ORDER — DEXTROSE 5 % IV SOLN: Freq: Once | INTRAVENOUS | Status: AC

## 2023-02-15 MED ORDER — SODIUM CHLORIDE 0.9 % IV SOLN
10.0000 mg | Freq: Once | INTRAVENOUS | Status: AC
  Administered 2023-02-15: 10 mg via INTRAVENOUS
  Filled 2023-02-15: qty 10

## 2023-02-15 MED ORDER — DENOSUMAB 120 MG/1.7ML ~~LOC~~ SOLN
120.0000 mg | Freq: Once | SUBCUTANEOUS | Status: AC
  Administered 2023-02-15: 120 mg via SUBCUTANEOUS
  Filled 2023-02-15: qty 1.7

## 2023-02-15 MED ORDER — ACETAMINOPHEN 325 MG PO TABS
650.0000 mg | ORAL_TABLET | Freq: Once | ORAL | Status: AC
  Administered 2023-02-15: 650 mg via ORAL
  Filled 2023-02-15: qty 2

## 2023-02-15 MED ORDER — FAM-TRASTUZUMAB DERUXTECAN-NXKI CHEMO 100 MG IV SOLR
3.2000 mg/kg | Freq: Once | INTRAVENOUS | Status: AC
  Administered 2023-02-15: 300 mg via INTRAVENOUS
  Filled 2023-02-15: qty 15

## 2023-02-15 MED ORDER — SODIUM CHLORIDE 0.9% FLUSH
10.0000 mL | INTRAVENOUS | Status: DC | PRN
  Administered 2023-02-15: 10 mL

## 2023-02-15 MED ORDER — HEPARIN SOD (PORK) LOCK FLUSH 100 UNIT/ML IV SOLN
500.0000 [IU] | Freq: Once | INTRAVENOUS | Status: AC | PRN
  Administered 2023-02-15: 500 [IU]

## 2023-02-15 MED ORDER — SODIUM CHLORIDE 0.9% FLUSH
10.0000 mL | Freq: Once | INTRAVENOUS | Status: AC
  Administered 2023-02-15: 10 mL

## 2023-02-15 MED ORDER — PALONOSETRON HCL INJECTION 0.25 MG/5ML
0.2500 mg | Freq: Once | INTRAVENOUS | Status: AC
  Administered 2023-02-15: 0.25 mg via INTRAVENOUS
  Filled 2023-02-15: qty 5

## 2023-02-15 MED ORDER — DIPHENHYDRAMINE HCL 25 MG PO CAPS
50.0000 mg | ORAL_CAPSULE | Freq: Once | ORAL | Status: AC
  Administered 2023-02-15: 50 mg via ORAL
  Filled 2023-02-15: qty 2

## 2023-02-15 NOTE — Progress Notes (Signed)
Per MD, pt will receive Enhertu & Xgeva today. Ramond Marrow, RN aware.

## 2023-02-15 NOTE — Assessment & Plan Note (Signed)
1.  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 bone metastasis: Currently on Xgeva every 3 months.   CT CAP 02/01/2022: Mild progression of disease with increased number and size of the numerous lung nodules in the right lung.  Persistent lymphadenopathy and right hilar and right paratracheal nodal areas, numerous hypovascular hepatic lesions slightly larger when compared to previous.   CA 27-29: 226 on 02/01/2022 (gradually progressing)   Plan: [facility] molecular testing: ER positive, ER positive, TMB 5, PD-L1 negative, PIK 3 CA negative  -------------------------------------------------------- Current treatment: Enhertu cycle 10 Echocardiogram: 06/14/2022: EF 55 to 60%   Chemo toxicities: 1.   Nausea: Subsided 2. fatigue: Mild to moderate 3.  Leukopenia: Watchful monitoring today's ANC is 1.5 4.  Alopecia: Stable   CT CAP 08/23/2022: Marked interval response to therapy.  Interval resolution of mediastinal and right hilar lymphadenopathy.  Near complete resolution of the nodularity seen previously along the right major and minor fissures.  Interval decrease in the liver metastases.  CT CAP 02/13/2023:   Return to clinic in 3 weeks for Enhertu with scans done prior to the treatment.

## 2023-02-16 LAB — CANCER ANTIGEN 27.29: CA 27.29: 71.5 U/mL — ABNORMAL HIGH (ref 0.0–38.6)

## 2023-02-28 NOTE — Progress Notes (Signed)
Patient Care Team: [name], MD as PCP - General (Internal Medicine) [name], MD as PCP - Cardiology (Cardiology) [name], MD as Attending Physician (Radiation Oncology) [name], [name], NP as Nurse Practitioner (Hematology and Oncology) [name], MD as Consulting Physician (General Surgery) [name], DPM as Consulting Physician (Podiatry) [name], MD as Consulting Physician (Gastroenterology) [name], MD as Attending Physician (Hematology and Oncology)  DIAGNOSIS: No diagnosis found.  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[place])   06/21/2022 - 08/03/2022 Chemotherapy   Patient is on Treatment Plan : BREAST METASTATIC fam-trastuzumab deruxtecan-nxki (Enhertu) q21d     06/21/2022 -  Chemotherapy   Patient is on Treatment Plan : BREAST METASTATIC Fam-Trastuzumab Deruxtecan-nxki (Enhertu) (5.4) q21d     Malignant neoplasm of upper-outer quadrant of right breast in female, estrogen receptor positive ([place])  06/21/2016 Initial Diagnosis   Malignant neoplasm of upper-outer quadrant of right breast in female, estrogen receptor positive ([place])   06/21/2016 Initial Biopsy   Right breast biopsy, 10 oclock: IDC, grade 3, ER+(95%), PR+(80%),Ki67 20%, HER-2 negative (ratio 0.67). Right axilla core biopsy: carcinoma, grade 3, ER+(100%), PR+(90%), Ki67 25%, HER-2 negative (ratio 1.13).    07/17/2016 - 11/06/2016 Neo-Adjuvant Chemotherapy   Received 2 cycles of Doxorubicin and Cyclophosphamide, then transitioned to weekly Paclitaxel (due to repeated febrile neutropenia) x 7 cycles, stopped early due to peripheral neuropathy, then completed her final 2 cycles of Doxorubicin and Cyclophosphamide.    12/19/2016 Surgery   Right breast lumpectomy ([facility]): IDC, grade 2, 1.6cm and 0.3cm, margins  negative, 3 SLN positive for metastatic carcinoma.     12/26/2016 Surgery   ALND: metastatic carcinoma in one of 20 lymph nodes, and three nodes from previous lumpectomy.  Four positive nodes, consistent with pN2a.   02/07/2017 - 03/21/2017 Radiation Therapy   Adjuvant radiation Isidore Moos): 1) Right breast and nodes - 4 field: 50 Gy in 25 fractions. IM NODES: >95% receive at least 45Gy/9f. 50Gy to SCLV/PAB @ 2Gy /fraction x 25 fractions. 2) Right breast boost: 10 Gy in 5 fractions   04/2017 -  Anti-estrogen oral therapy   Anastrozole '1mg'$  daily.  Bone density 03/23/2017 finds T score of -2.6, osteoporosis, plan to start Prolia following dental clearance Anastrozole stopped 01/16/18 Exemestane 25 mg daily 02/12/18  On [name], trial randomized to endocrine therapy alone   05/27/2020 Progression   chest CT scan 05/27/2020 shows bulky mediastinal and right hilar lymphadenopathy with right pleural nodules and a small right pleural effusion, no evidence of liver or bone involvement             (a) biopsy of right breast mass 06/02/2020 shows invasive ductal carcinoma, estrogen and progesterone receptor positive, HER-2 not amplified, with an MIB-1 of 40%   06/17/2020 Treatment Plan Change    fulvestrant to start 06/17/2020             (a) palbociclib to start 06/17/2020 at 125 mg daily 21 days on 7 days off             (b) palbociclib decreased to '125mg'$  every other day x 11 doses on 07/23/2020             (c) palbociclib dose decreased to100 mg daily, 21/7, starting with September cycle             (d) Palbociclib decreased  to 75mg  daily beginning with March cycle due to oral ulcers             (e) palbociclib discontinued November 2022 with progression   09/14/2021 PET scan   PET scan 09/14/2021 shows progression in liver and bone   10/05/2021 Pathology Results   liver biopsy 10/05/2021 confirms metastatic carcinoma, estrogen receptor strongly positive, progesterone receptor and HER2 negative, with an  MIB-1 of 30%.   10/2021 Treatment Plan Change   fulvestrant continued, Xgeva added, palbociclib changed to abemaciclib November 2022 -Dose Decreased to 50mg  PO BID Daily   03/07/2022 Miscellaneous   [facility] molecular testing: ER positive, ER positive, MTAP: Detected, ESR 1 negative, BRCA 1 and 2 negative, MSI stable, PD-L1 negative, PIK 3 CA negative, PR negative,   06/21/2022 - 08/03/2022 Chemotherapy   Patient is on Treatment Plan : BREAST METASTATIC fam-trastuzumab deruxtecan-nxki (Enhertu) q21d     06/21/2022 -  Chemotherapy   Patient is on Treatment Plan : BREAST METASTATIC Fam-Trastuzumab Deruxtecan-nxki (Enhertu) (5.4) q21d     01/25/2023 Cancer Staging   Staging form: Breast, AJCC 7th Edition - Pathologic: Stage IV (M1) - Signed by Gardenia Phlegm, NP on 01/25/2023   Breast cancer, stage 4 ([place])  03/09/2022 Initial Diagnosis   Breast cancer, stage 4 ([place])   06/21/2022 - 08/03/2022 Chemotherapy   Patient is on Treatment Plan : BREAST METASTATIC fam-trastuzumab deruxtecan-nxki (Enhertu) q21d     06/21/2022 -  Chemotherapy   Patient is on Treatment Plan : BREAST METASTATIC Fam-Trastuzumab Deruxtecan-nxki (Enhertu) (5.4) q21d       CHIEF COMPLIANT:  Follow-up metastatic breast cancer   INTERVAL HISTORY: April [name] is a 62 year old with above-mentioned metastatic breast cancer is currently on Enhertu treatment. She presents to the clinic for a follow-up.    ALLERGIES:  is allergic to codeine, cymbalta [duloxetine hcl], hydrocodone, ultram [tramadol hcl], venlafaxine, and gabapentin.  MEDICATIONS:  Current Outpatient Medications  Medication Sig Dispense Refill   benzonatate (TESSALON) 200 MG capsule Take 1 capsule (200 mg total) by mouth 3 (three) times daily as needed for cough. 20 capsule 1   carvedilol (COREG) 3.125 MG tablet Take 1 tablet (3.125 mg total) by mouth 2 (two) times daily. 180 tablet 3   Denosumab (XGEVA [place]) Inject 120 mg into the skin every 3 (three)  months.     fam-trastuzumab deruxtecan-nxki (ENHERTU) 100 MG SOLR Inject 100 mg into the vein every 21 ( twenty-one) days.     loratadine (CLARITIN) 10 MG tablet Take 10 mg by mouth daily.     naproxen sodium (ALEVE) 220 MG tablet Take 440-660 mg by mouth 2 (two) times daily as needed (pain).     omeprazole (PRILOSEC) 20 MG capsule Take 20 mg by mouth daily.     ondansetron (ZOFRAN) 8 MG tablet as needed for nausea or vomiting.     rizatriptan (MAXALT) 5 MG tablet Take 1 tablet (5 mg total) by mouth as needed for migraine. May repeat in 2 hours if needed 10 tablet 0   simethicone (MYLICON) 0000000 MG chewable tablet Chew 125 mg by mouth every 6 (six) hours as needed for flatulence.     valACYclovir (VALTREX) 1000 MG tablet Take 1 tablet (1,000 mg total) by mouth 2 (two) times daily. 1/2 tab BID (Patient taking differently: Take 500 mg by mouth See admin instructions. Take 500 mg daily, may take a second 500 mg dose as needed for outbreaks) 180 tablet 4   No current facility-administered medications for  this visit.    PHYSICAL EXAMINATION: ECOG PERFORMANCE STATUS: {CHL ONC ECOG PS:[id number]}  There were no vitals filed for this visit. There were no vitals filed for this visit.  BREAST:*** No palpable masses or nodules in either right or left breasts. No palpable axillary supraclavicular or infraclavicular adenopathy no breast tenderness or nipple discharge. (exam performed in the presence of a chaperone)  LABORATORY DATA:  I have reviewed the data as listed    Latest Ref Rng & Units 02/15/2023    2:33 PM 01/25/2023    1:42 PM 01/04/2023    1:01 PM  CMP  Glucose 70 - 99 mg/dL 96  93  77   BUN 6 - 20 mg/dL 13  14  15    Creatinine 0.44 - 1.00 mg/dL 0.83  0.75  0.94   Sodium 135 - 145 mmol/L 139  138  141   Potassium 3.5 - 5.1 mmol/L 4.0  3.8  4.1   Chloride 98 - 111 mmol/L 107  106  111   CO2 22 - 32 mmol/L 28  26  24    Calcium 8.9 - 10.3 mg/dL 9.2  8.7  8.9   Total Protein 6.5 - 8.1 g/dL  6.9  6.8  6.4   Total Bilirubin 0.3 - 1.2 mg/dL 0.4  0.4  0.4   Alkaline Phos 38 - 126 U/L 84  81  73   AST 15 - 41 U/L 24  22  20    ALT 0 - 44 U/L 15  15  13      Lab Results  Component Value Date   WBC 3.1 (L) 02/15/2023   HGB 13.1 02/15/2023   HCT 37.3 02/15/2023   MCV 101.1 (H) 02/15/2023   PLT 221 02/15/2023   NEUTROABS 1.5 (L) 02/15/2023    ASSESSMENT & PLAN:  No problem-specific Assessment & Plan notes found for this encounter.    No orders of the defined types were placed in this encounter.  The patient has a good understanding of the overall plan. she agrees with it. she will call with any problems that may develop before the next visit here. Total time spent: 30 mins including face to face time and time spent for planning, charting and co-ordination of care   Suzzette Righter, [place] 02/28/23    I Gardiner Coins am acting as a Education administrator for [employer]  ***

## 2023-03-06 ENCOUNTER — Other Ambulatory Visit: Payer: Self-pay

## 2023-03-07 MED FILL — Dexamethasone Sodium Phosphate Inj 100 MG/10ML: INTRAMUSCULAR | Qty: 1 | Status: AC

## 2023-03-08 ENCOUNTER — Inpatient Hospital Stay: Payer: Medicaid Other | Attending: Oncology

## 2023-03-08 ENCOUNTER — Inpatient Hospital Stay: Payer: Medicaid Other

## 2023-03-08 ENCOUNTER — Inpatient Hospital Stay (HOSPITAL_BASED_OUTPATIENT_CLINIC_OR_DEPARTMENT_OTHER): Payer: Medicaid Other | Admitting: Hematology and Oncology

## 2023-03-08 ENCOUNTER — Other Ambulatory Visit: Payer: Self-pay

## 2023-03-08 ENCOUNTER — Telehealth: Payer: Self-pay | Admitting: Hematology and Oncology

## 2023-03-08 VITALS — BP 118/75 | HR 76 | Temp 98.2°F | Resp 20

## 2023-03-08 VITALS — BP 119/81 | HR 84 | Temp 98.1°F | Resp 17 | Wt 215.3 lb

## 2023-03-08 DIAGNOSIS — Z170 Estrogen receptor positive status [ER+]: Secondary | ICD-10-CM

## 2023-03-08 DIAGNOSIS — C773 Secondary and unspecified malignant neoplasm of axilla and upper limb lymph nodes: Secondary | ICD-10-CM

## 2023-03-08 DIAGNOSIS — G620 Drug-induced polyneuropathy: Secondary | ICD-10-CM | POA: Insufficient documentation

## 2023-03-08 DIAGNOSIS — T451X5A Adverse effect of antineoplastic and immunosuppressive drugs, initial encounter: Secondary | ICD-10-CM | POA: Insufficient documentation

## 2023-03-08 DIAGNOSIS — C50411 Malignant neoplasm of upper-outer quadrant of right female breast: Secondary | ICD-10-CM

## 2023-03-08 DIAGNOSIS — Z9221 Personal history of antineoplastic chemotherapy: Secondary | ICD-10-CM | POA: Diagnosis not present

## 2023-03-08 DIAGNOSIS — Z79624 Long term (current) use of inhibitors of nucleotide synthesis: Secondary | ICD-10-CM | POA: Insufficient documentation

## 2023-03-08 DIAGNOSIS — Z5112 Encounter for antineoplastic immunotherapy: Secondary | ICD-10-CM | POA: Insufficient documentation

## 2023-03-08 DIAGNOSIS — Z79811 Long term (current) use of aromatase inhibitors: Secondary | ICD-10-CM | POA: Insufficient documentation

## 2023-03-08 DIAGNOSIS — C50911 Malignant neoplasm of unspecified site of right female breast: Secondary | ICD-10-CM

## 2023-03-08 DIAGNOSIS — C50919 Malignant neoplasm of unspecified site of unspecified female breast: Secondary | ICD-10-CM

## 2023-03-08 DIAGNOSIS — Z79899 Other long term (current) drug therapy: Secondary | ICD-10-CM | POA: Insufficient documentation

## 2023-03-08 DIAGNOSIS — C7951 Secondary malignant neoplasm of bone: Secondary | ICD-10-CM | POA: Diagnosis not present

## 2023-03-08 DIAGNOSIS — R5383 Other fatigue: Secondary | ICD-10-CM | POA: Diagnosis not present

## 2023-03-08 DIAGNOSIS — R110 Nausea: Secondary | ICD-10-CM | POA: Insufficient documentation

## 2023-03-08 DIAGNOSIS — D709 Neutropenia, unspecified: Secondary | ICD-10-CM | POA: Insufficient documentation

## 2023-03-08 DIAGNOSIS — G629 Polyneuropathy, unspecified: Secondary | ICD-10-CM | POA: Insufficient documentation

## 2023-03-08 LAB — CMP (CANCER CENTER ONLY)
ALT: 15 U/L (ref 0–44)
AST: 22 U/L (ref 15–41)
Albumin: 3.6 g/dL (ref 3.5–5.0)
Alkaline Phosphatase: 87 U/L (ref 38–126)
Anion gap: 7 (ref 5–15)
BUN: 11 mg/dL (ref 6–20)
CO2: 27 mmol/L (ref 22–32)
Calcium: 9.7 mg/dL (ref 8.9–10.3)
Chloride: 106 mmol/L (ref 98–111)
Creatinine: 0.78 mg/dL (ref 0.44–1.00)
GFR, Estimated: >60 mL/min (ref >60)
Glucose, Bld: 96 mg/dL (ref 70–99)
Potassium: 3.9 mmol/L (ref 3.5–5.1)
Sodium: 140 mmol/L (ref 135–145)
Total Bilirubin: 0.4 mg/dL (ref 0.3–1.2)
Total Protein: 6.9 g/dL (ref 6.5–8.1)

## 2023-03-08 LAB — CBC WITH DIFFERENTIAL (CANCER CENTER ONLY)
Abs Immature Granulocytes: 0.01 10*3/uL (ref 0.00–0.07)
Basophils Absolute: 0 10*3/uL (ref 0.0–0.1)
Basophils Relative: 0 %
Eosinophils Absolute: 0.2 10*3/uL (ref 0.0–0.5)
Eosinophils Relative: 6 %
HCT: 37.9 % (ref 36.0–46.0)
Hemoglobin: 12.9 g/dL (ref 12.0–15.0)
Immature Granulocytes: 0 %
Lymphocytes Relative: 24 %
Lymphs Abs: 0.8 10*3/uL (ref 0.7–4.0)
MCH: 34.4 pg — ABNORMAL HIGH (ref 26.0–34.0)
MCHC: 34 g/dL (ref 30.0–36.0)
MCV: 101.1 fL — ABNORMAL HIGH (ref 80.0–100.0)
Monocytes Absolute: 0.5 10*3/uL (ref 0.1–1.0)
Monocytes Relative: 15 %
Neutro Abs: 1.7 10*3/uL (ref 1.7–7.7)
Neutrophils Relative %: 55 %
Platelet Count: 211 10*3/uL (ref 150–400)
RBC: 3.75 MIL/uL — ABNORMAL LOW (ref 3.87–5.11)
RDW: 15.5 % (ref 11.5–15.5)
WBC Count: 3.2 10*3/uL — ABNORMAL LOW (ref 4.0–10.5)
nRBC: 0 % (ref 0.0–0.2)

## 2023-03-08 MED ORDER — PALONOSETRON HCL INJECTION 0.25 MG/5ML
0.2500 mg | Freq: Once | INTRAVENOUS | Status: AC
  Administered 2023-03-08: 0.25 mg via INTRAVENOUS
  Filled 2023-03-08: qty 5

## 2023-03-08 MED ORDER — ACETAMINOPHEN 325 MG PO TABS
650.0000 mg | ORAL_TABLET | Freq: Once | ORAL | Status: AC
  Administered 2023-03-08: 650 mg via ORAL
  Filled 2023-03-08: qty 2

## 2023-03-08 MED ORDER — DEXTROSE 5 % IV SOLN: Freq: Once | INTRAVENOUS | Status: AC

## 2023-03-08 MED ORDER — HEPARIN SOD (PORK) LOCK FLUSH 100 UNIT/ML IV SOLN
500.0000 [IU] | Freq: Once | INTRAVENOUS | Status: AC | PRN
  Administered 2023-03-08: 500 [IU]

## 2023-03-08 MED ORDER — SODIUM CHLORIDE 0.9 % IV SOLN
10.0000 mg | Freq: Once | INTRAVENOUS | Status: AC
  Administered 2023-03-08: 10 mg via INTRAVENOUS
  Filled 2023-03-08: qty 10

## 2023-03-08 MED ORDER — DIPHENHYDRAMINE HCL 25 MG PO CAPS
50.0000 mg | ORAL_CAPSULE | Freq: Once | ORAL | Status: AC
  Administered 2023-03-08: 50 mg via ORAL
  Filled 2023-03-08: qty 2

## 2023-03-08 MED ORDER — SODIUM CHLORIDE 0.9% FLUSH
10.0000 mL | INTRAVENOUS | Status: DC | PRN
  Administered 2023-03-08: 10 mL

## 2023-03-08 MED ORDER — SODIUM CHLORIDE 0.9% FLUSH
10.0000 mL | Freq: Once | INTRAVENOUS | Status: AC
  Administered 2023-03-08: 10 mL

## 2023-03-08 MED ORDER — FAM-TRASTUZUMAB DERUXTECAN-NXKI CHEMO 100 MG IV SOLR
3.2000 mg/kg | Freq: Once | INTRAVENOUS | Status: AC
  Administered 2023-03-08: 300 mg via INTRAVENOUS
  Filled 2023-03-08: qty 15

## 2023-03-08 NOTE — Telephone Encounter (Signed)
Reached out to patient to schedule; left voicemail.

## 2023-03-08 NOTE — Assessment & Plan Note (Addendum)
1.  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 bone metastasis: Currently on Xgeva every 3 months.   CT CAP 02/01/2022: Mild progression of disease with increased number and size of the numerous lung nodules in the right lung.  Persistent lymphadenopathy and right hilar and right paratracheal nodal areas, numerous hypovascular hepatic lesions slightly larger when compared to previous.   CA 27-29: 226 on 02/01/2022 (gradually progressing)   Plan: [facility] molecular testing: ER positive, ER positive, TMB 5, PD-L1 negative, PIK 3 CA negative  -------------------------------------------------------- Current treatment: Enhertu cycle 14 Echocardiogram: 06/14/2022: EF 55 to 60%   Chemo toxicities: 1.   Nausea: Subsided 2. fatigue: Mild to moderate 3.  Leukopenia: Watchful monitoring today's ANC is  4.  Alopecia: Stable  CT CAP 02/13/2023:Mixed response (2.1 cm is now 1.9 cm), 1.8 cm is now 1.3 cm, 1 cm is now 0.8 cm, ? New wedge shaped area, stable bone mets   Return to clinic in 3 weeks for Enhertu

## 2023-03-08 NOTE — Patient Instructions (Signed)
[facility]  Discharge Instructions: Thank you for choosing [facility] to provide your oncology and hematology care.   If you have a lab appointment with the [facility], please go directly to the [facility] and check in at the registration area.   Wear comfortable clothing and clothing appropriate for easy access to any Portacath or PICC line.   We strive to give you quality time with your provider. You may need to reschedule your appointment if you arrive late (15 or more minutes).  Arriving late affects you and other patients whose appointments are after yours.  Also, if you miss three or more appointments without notifying the office, you may be dismissed from the clinic at the provider's discretion.      For prescription refill requests, have your pharmacy contact our office and allow 72 hours for refills to be completed.    Today you received the following chemotherapy and/or immunotherapy agent: Enhertu   To help prevent nausea and vomiting after your treatment, we encourage you to take your nausea medication as directed.  BELOW ARE SYMPTOMS THAT SHOULD BE REPORTED IMMEDIATELY: *FEVER GREATER THAN 100.4 F (38 C) OR HIGHER *CHILLS OR SWEATING *NAUSEA AND VOMITING THAT IS NOT CONTROLLED WITH YOUR NAUSEA MEDICATION *UNUSUAL SHORTNESS OF BREATH *UNUSUAL BRUISING OR BLEEDING *URINARY PROBLEMS (pain or burning when urinating, or frequent urination) *BOWEL PROBLEMS (unusual diarrhea, constipation, pain near the anus) TENDERNESS IN MOUTH AND THROAT WITH OR WITHOUT PRESENCE OF ULCERS (sore throat, sores in mouth, or a toothache) UNUSUAL RASH, SWELLING OR PAIN  UNUSUAL VAGINAL DISCHARGE OR ITCHING   Items with * indicate a potential emergency and should be followed up as soon as possible or go to the Emergency Department if any problems should occur.  Please show the CHEMOTHERAPY ALERT CARD or IMMUNOTHERAPY ALERT CARD at check-in to  the Emergency Department and triage nurse.  Should you have questions after your visit or need to cancel or reschedule your appointment, please contact [facility]  Dept: [phone]  and follow the prompts.  Office hours are 8:00 a.m. to 4:30 p.m. Monday - Friday. Please note that voicemails left after 4:00 p.m. may not be returned until the following business day.  We are closed weekends and major holidays. You have access to a nurse at all times for urgent questions. Please call the main number to the clinic Dept: [phone] and follow the prompts.   For any non-urgent questions, you may also contact your provider using MyChart. We now offer e-Visits for anyone 41 and older to request care online for non-urgent symptoms. For details visit [email or web address].   Also download the MyChart app! Go to the app store, search "MyChart", open the app, select [facility], and log in with your MyChart username and password.  Fam-Trastuzumab Deruxtecan Injection What is this medication? FAM-TRASTUZUMAB DERUXTECAN (fam-tras TOOZ eu mab DER ux TEE kan) treats some types of cancer. It works by blocking a protein that causes cancer cells to grow and multiply. This helps to slow or stop the spread of cancer cells. This medicine may be used for other purposes; ask your health care provider or pharmacist if you have questions. COMMON BRAND NAME(S): ENHERTU What should I tell my care team before I take this medication? They need to know if you have any of these conditions: Heart disease Heart failure Infection, especially a viral infection, such as chickenpox, cold sores, or herpes  Liver disease Lung or breathing disease, such as asthma or COPD An unusual or allergic reaction to fam-trastuzumab deruxtecan, other medications, foods, dyes, or preservatives Pregnant or trying to get pregnant Breast-feeding How should I use this medication? This medication is injected  into a vein. It is given by your care team in a hospital or clinic setting. A special MedGuide will be given to you before each treatment. Be sure to read this information carefully each time. Talk to your care team about the use of this medication in children. Special care may be needed. Overdosage: If you think you have taken too much of this medicine contact a poison control center or emergency room at once. NOTE: This medicine is only for you. Do not share this medicine with others. What if I miss a dose? It is important not to miss your dose. Call your care team if you are unable to keep an appointment. What may interact with this medication? Interactions are not expected. This list may not describe all possible interactions. Give your health care provider a list of all the medicines, herbs, non-prescription drugs, or dietary supplements you use. Also tell them if you smoke, drink alcohol, or use illegal drugs. Some items may interact with your medicine. What should I watch for while using this medication? Visit your care team for regular checks on your progress. Tell your care team if your symptoms do not start to get better or if they get worse. Your condition will be monitored carefully while you are receiving this medication. Do not become pregnant while taking this medication or for 7 months after stopping it. Women should inform their care team if they wish to become pregnant or think they might be pregnant. Men should not father a child while taking this medication and for 4 months after stopping it. There is potential for serious side effects to an unborn child. Talk to your care team for more information. Do not breast-feed an infant while taking this medication or for 7 months after the last dose. This medication has caused decreased sperm counts in some men. This may make it more difficult to father a child. Talk to your care team if you are concerned about your fertility. This  medication may increase your risk to bruise or bleed. Call your care team if you notice any unusual bleeding. Be careful brushing or flossing your teeth or using a toothpick because you may get an infection or bleed more easily. If you have any dental work done, tell your dentist you are receiving this medication. This medication may cause dry eyes and blurred vision. If you wear contact lenses, you may feel some discomfort. Lubricating eye drops may help. See your care team if the problem does not go away or is severe. This medication may increase your risk of getting an infection. Call your care team for advice if you get a fever, chills, sore throat, or other symptoms of a cold or flu. Do not treat yourself. Try to avoid being around people who are sick. Avoid taking medications that contain aspirin, acetaminophen, ibuprofen, naproxen, or ketoprofen unless instructed by your care team. These medications may hide a fever. What side effects may I notice from receiving this medication? Side effects that you should report to your care team as soon as possible: Allergic reactions--skin rash, itching, hives, swelling of the face, lips, tongue, or throat Dry cough, shortness of breath or trouble breathing Infection--fever, chills, cough, sore throat, wounds that don't heal, pain  or trouble when passing urine, general feeling of discomfort or being unwell Heart failure--shortness of breath, swelling of the ankles, feet, or hands, sudden weight gain, unusual weakness or fatigue Unusual bruising or bleeding Side effects that usually do not require medical attention (report these to your care team if they continue or are bothersome): Constipation Diarrhea Hair loss Muscle pain Nausea Vomiting This list may not describe all possible side effects. Call your doctor for medical advice about side effects. You may report side effects to FDA at 1-800-FDA-1088. Where should I keep my medication? This medication  is given in a hospital or clinic. It will not be stored at home. NOTE: This sheet is a summary. It may not cover all possible information. If you have questions about this medicine, talk to your doctor, pharmacist, or health care provider.  2023 Elsevier/Gold Standard (2021-08-03 00:00:00)

## 2023-03-09 ENCOUNTER — Other Ambulatory Visit: Payer: Self-pay

## 2023-03-09 LAB — CANCER ANTIGEN 27.29: CA 27.29: 75.8 U/mL — ABNORMAL HIGH (ref 0.0–38.6)

## 2023-03-23 NOTE — Progress Notes (Signed)
Patient Care Team: Julieanne Manson, MD as PCP - General (Internal Medicine) Maisie Fus, MD as PCP - Cardiology (Cardiology) Lonie Peak, MD as Attending Physician (Radiation Oncology) Axel Filler, Larna Daughters, NP as Nurse Practitioner (Hematology and Oncology) Berna Bue, MD as Consulting Physician (General Surgery) Felecia Shelling, DPM as Consulting Physician (Podiatry) Graylin Shiver, MD as Consulting Physician (Gastroenterology) Serena Croissant, MD as Attending Physician (Hematology and Oncology)  DIAGNOSIS: No diagnosis found.  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 lymph node  06/21/2016 Initial Diagnosis   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)   06/21/2022 - 08/03/2022 Chemotherapy   Patient is on Treatment Plan : BREAST METASTATIC fam-trastuzumab deruxtecan-nxki (Enhertu) q21d     06/21/2022 -  Chemotherapy   Patient is on Treatment Plan : BREAST METASTATIC Fam-Trastuzumab Deruxtecan-nxki (Enhertu) (5.4) q21d     Malignant neoplasm of upper-outer quadrant of right breast in female, estrogen receptor positive  06/21/2016 Initial Diagnosis   Malignant neoplasm of upper-outer quadrant of right breast in female, estrogen receptor positive (HCC)   06/21/2016 Initial Biopsy   Right breast biopsy, 10 oclock: IDC, grade 3, ER+(95%), PR+(80%),Ki67 20%, HER-2 negative (ratio 0.67). Right axilla core biopsy: carcinoma, grade 3, ER+(100%), PR+(90%), Ki67 25%, HER-2 negative (ratio 1.13).    07/17/2016 - 11/06/2016 Neo-Adjuvant Chemotherapy   Received 2 cycles of Doxorubicin and Cyclophosphamide, then transitioned to weekly Paclitaxel (due to repeated febrile neutropenia) x 7 cycles, stopped early due to peripheral neuropathy, then completed her final 2 cycles of Doxorubicin and Cyclophosphamide.    12/19/2016 Surgery   Right breast lumpectomy ([facility]): IDC, grade 2, 1.6cm and 0.3cm, margins negative, 3 SLN  positive for metastatic carcinoma.     12/26/2016 Surgery   ALND: metastatic carcinoma in one of 20 lymph nodes, and three nodes from previous lumpectomy.  Four positive nodes, consistent with pN2a.   02/07/2017 - 03/21/2017 Radiation Therapy   Adjuvant radiation [facility]): 1) Right breast and nodes - 4 field: 50 Gy in 25 fractions. IM NODES: >95% receive at least 45Gy/92fx. 50Gy to SCLV/PAB @ 2Gy /fraction x 25 fractions. 2) Right breast boost: 10 Gy in 5 fractions   04/2017 -  Anti-estrogen oral therapy   Anastrozole  daily.  Bone density 03/23/2017 finds T score of -2.6, osteoporosis, plan to start Prolia following dental clearance Anastrozole stopped 01/16/18 Exemestane 25 mg daily 02/12/18  On [name], trial randomized to endocrine therapy alone   05/27/2020 Progression   chest CT scan 05/27/2020 shows bulky mediastinal and right hilar lymphadenopathy with right pleural nodules and a small right pleural effusion, no evidence of liver or bone involvement             (a) biopsy of right breast mass 06/02/2020 shows invasive ductal carcinoma, estrogen and progesterone receptor positive, HER-2 not amplified, with an MIB-1 of 40%   06/17/2020 Treatment Plan Change    fulvestrant to start 06/17/2020             (a) palbociclib to start 06/17/2020 at 125 mg daily 21 days on 7 days off             (b) palbociclib decreased to  every other day x 11 doses on 07/23/2020             (c) palbociclib dose decreased to100 mg daily, 21/7, starting with September cycle             (d) Palbociclib decreased to   daily beginning with March cycle due to oral ulcers             (e) palbociclib discontinued November 2022 with progression   09/14/2021 PET scan   PET scan 09/14/2021 shows progression in liver and bone   10/05/2021 Pathology Results   liver biopsy 10/05/2021 confirms metastatic carcinoma, estrogen receptor strongly positive, progesterone receptor and HER2 negative, with an MIB-1 of 30%.    10/2021 Treatment Plan Change   fulvestrant continued, Xgeva added, palbociclib changed to abemaciclib November 2022 -Dose Decreased to 50mg  PO BID Daily   03/07/2022 Miscellaneous   [facility] molecular testing: ER positive, ER positive, MTAP: Detected, ESR 1 negative, BRCA 1 and 2 negative, MSI stable, PD-L1 negative, PIK 3 CA negative, PR negative,   06/21/2022 - 08/03/2022 Chemotherapy   Patient is on Treatment Plan : BREAST METASTATIC fam-trastuzumab deruxtecan-nxki (Enhertu) q21d     06/21/2022 -  Chemotherapy   Patient is on Treatment Plan : BREAST METASTATIC Fam-Trastuzumab Deruxtecan-nxki (Enhertu) (5.4) q21d     01/25/2023 Cancer Staging   Staging form: Breast, AJCC 7th Edition - Pathologic: Stage IV (M1) - Signed by Loa Socks, NP on 01/25/2023   Breast cancer, stage 4  03/09/2022 Initial Diagnosis   Breast cancer, stage 4 (HCC)   06/21/2022 - 08/03/2022 Chemotherapy   Patient is on Treatment Plan : BREAST METASTATIC fam-trastuzumab deruxtecan-nxki (Enhertu) q21d     06/21/2022 -  Chemotherapy   Patient is on Treatment Plan : BREAST METASTATIC Fam-Trastuzumab Deruxtecan-nxki (Enhertu) (5.4) q21d       CHIEF COMPLIANT:   INTERVAL HISTORY: April [name] is a   ALLERGIES:  is allergic to codeine, cymbalta [duloxetine hcl], hydrocodone, ultram [tramadol hcl], venlafaxine, and gabapentin.  MEDICATIONS:  Current Outpatient Medications  Medication Sig Dispense Refill   benzonatate (TESSALON) 200 MG capsule Take 1 capsule (200 mg total) by mouth 3 (three) times daily as needed for cough. 20 capsule 1   carvedilol (COREG) 3.125 MG tablet Take 1 tablet (3.125 mg total) by mouth 2 (two) times daily. 180 tablet 3   Denosumab (XGEVA [place]) Inject 120 mg into the skin every 3 (three) months.     fam-trastuzumab deruxtecan-nxki (ENHERTU) 100 MG SOLR Inject 100 mg into the vein every 21 ( twenty-one) days.     loratadine (CLARITIN) 10 MG tablet Take 10 mg by mouth daily.      naproxen sodium (ALEVE) 220 MG tablet Take 440-660 mg by mouth 2 (two) times daily as needed (pain).     omeprazole (PRILOSEC) 20 MG capsule Take 20 mg by mouth daily.     ondansetron (ZOFRAN) 8 MG tablet as needed for nausea or vomiting.     rizatriptan (MAXALT) 5 MG tablet Take 1 tablet (5 mg total) by mouth as needed for migraine. May repeat in 2 hours if needed 10 tablet 0   simethicone (MYLICON) 125 MG chewable tablet Chew 125 mg by mouth every 6 (six) hours as needed for flatulence.     valACYclovir (VALTREX) 1000 MG tablet Take 1 tablet (1,000 mg total) by mouth 2 (two) times daily. 1/2 tab BID (Patient taking differently: Take 500 mg by mouth See admin instructions. Take 500 mg daily, may take a second 500 mg dose as needed for outbreaks) 180 tablet 4   No current facility-administered medications for this visit.    PHYSICAL EXAMINATION: ECOG PERFORMANCE STATUS: {CHL ONC ECOG PS:[phone]}  There were no vitals filed for this visit. There were no vitals filed  for this visit.  BREAST:*** No palpable masses or nodules in either right or left breasts. No palpable axillary supraclavicular or infraclavicular adenopathy no breast tenderness or nipple discharge. (exam performed in the presence of a chaperone)  LABORATORY DATA:  I have reviewed the data as listed    Latest Ref Rng & Units 03/08/2023   11:12 AM 02/15/2023    2:33 PM 01/25/2023    1:42 PM  CMP  Glucose 70 - 99 mg/dL 96  96  93   BUN 6 - 20 mg/dL Creatinine 0.44 - 1.00 mg/dL 4.09  8.11  9.14   Sodium 135 - 145 mmol/L 140  139  138   Potassium 3.5 - 5.1 mmol/L 3.9  4.0  3.8   Chloride 98 - 111 mmol/L 106  107  106   CO2 22 - 32 mmol/L Calcium 8.9 - 10.3 mg/dL 9.7  9.2  8.7   Total Protein 6.5 - 8.1 g/dL 6.9  6.9  6.8   Total Bilirubin 0.3 - 1.2 mg/dL 0.4  0.4  0.4   Alkaline Phos 38 - 126 U/L 87  84  81   AST 15 - 41 U/L ALT 0 - 44 U/L Lab Results  Component  Value Date   WBC 3.2 (L) 03/08/2023   HGB 12.9 03/08/2023   HCT 37.9 03/08/2023   MCV 101.1 (H) 03/08/2023   PLT 211 03/08/2023   NEUTROABS 1.7 03/08/2023    ASSESSMENT & PLAN:  No problem-specific Assessment & Plan notes found for this encounter.    No orders of the defined types were placed in this encounter.  The patient has a good understanding of the overall plan. she agrees with it. she will call with any problems that may develop before the next visit here. Total time spent: 30 mins including face to face time and time spent for planning, charting and co-ordination of care   Sherlyn Lick, CMA 03/23/23    I Janan Ridge am acting as a Neurosurgeon for [employer]  ***

## 2023-03-27 MED FILL — Dexamethasone Sodium Phosphate Inj 100 MG/10ML: INTRAMUSCULAR | Qty: 1 | Status: AC

## 2023-03-28 ENCOUNTER — Inpatient Hospital Stay: Payer: Medicaid Other

## 2023-03-28 ENCOUNTER — Other Ambulatory Visit: Payer: Self-pay

## 2023-03-28 ENCOUNTER — Inpatient Hospital Stay (HOSPITAL_BASED_OUTPATIENT_CLINIC_OR_DEPARTMENT_OTHER): Payer: Medicaid Other | Admitting: Hematology and Oncology

## 2023-03-28 VITALS — BP 116/72 | HR 67 | Resp 16

## 2023-03-28 VITALS — BP 111/70 | HR 76 | Temp 97.5°F | Resp 18 | Ht 64.0 in | Wt 221.1 lb

## 2023-03-28 DIAGNOSIS — C50919 Malignant neoplasm of unspecified site of unspecified female breast: Secondary | ICD-10-CM

## 2023-03-28 DIAGNOSIS — Z5112 Encounter for antineoplastic immunotherapy: Secondary | ICD-10-CM | POA: Diagnosis not present

## 2023-03-28 DIAGNOSIS — C50411 Malignant neoplasm of upper-outer quadrant of right female breast: Secondary | ICD-10-CM | POA: Diagnosis not present

## 2023-03-28 DIAGNOSIS — Z170 Estrogen receptor positive status [ER+]: Secondary | ICD-10-CM

## 2023-03-28 DIAGNOSIS — C50911 Malignant neoplasm of unspecified site of right female breast: Secondary | ICD-10-CM

## 2023-03-28 DIAGNOSIS — C773 Secondary and unspecified malignant neoplasm of axilla and upper limb lymph nodes: Secondary | ICD-10-CM | POA: Diagnosis not present

## 2023-03-28 LAB — CMP (CANCER CENTER ONLY)
ALT: 15 U/L (ref 0–44)
AST: 26 U/L (ref 15–41)
Albumin: 3.4 g/dL — ABNORMAL LOW (ref 3.5–5.0)
Alkaline Phosphatase: 75 U/L (ref 38–126)
Anion gap: 4 — ABNORMAL LOW (ref 5–15)
BUN: 15 mg/dL (ref 6–20)
CO2: 27 mmol/L (ref 22–32)
Calcium: 8.8 mg/dL — ABNORMAL LOW (ref 8.9–10.3)
Chloride: 109 mmol/L (ref 98–111)
Creatinine: 0.75 mg/dL (ref 0.44–1.00)
GFR, Estimated: >60 mL/min (ref >60)
Glucose, Bld: 94 mg/dL (ref 70–99)
Potassium: 3.9 mmol/L (ref 3.5–5.1)
Sodium: 140 mmol/L (ref 135–145)
Total Bilirubin: 0.5 mg/dL (ref 0.3–1.2)
Total Protein: 6.2 g/dL — ABNORMAL LOW (ref 6.5–8.1)

## 2023-03-28 LAB — CBC WITH DIFFERENTIAL (CANCER CENTER ONLY)
Abs Immature Granulocytes: 0.01 10*3/uL (ref 0.00–0.07)
Basophils Absolute: 0 10*3/uL (ref 0.0–0.1)
Basophils Relative: 1 %
Eosinophils Absolute: 0.2 10*3/uL (ref 0.0–0.5)
Eosinophils Relative: 6 %
HCT: 34.5 % — ABNORMAL LOW (ref 36.0–46.0)
Hemoglobin: 11.9 g/dL — ABNORMAL LOW (ref 12.0–15.0)
Immature Granulocytes: 0 %
Lymphocytes Relative: 24 %
Lymphs Abs: 0.7 10*3/uL (ref 0.7–4.0)
MCH: 35.3 pg — ABNORMAL HIGH (ref 26.0–34.0)
MCHC: 34.5 g/dL (ref 30.0–36.0)
MCV: 102.4 fL — ABNORMAL HIGH (ref 80.0–100.0)
Monocytes Absolute: 0.5 10*3/uL (ref 0.1–1.0)
Monocytes Relative: 17 %
Neutro Abs: 1.5 10*3/uL — ABNORMAL LOW (ref 1.7–7.7)
Neutrophils Relative %: 52 %
Platelet Count: 182 10*3/uL (ref 150–400)
RBC: 3.37 MIL/uL — ABNORMAL LOW (ref 3.87–5.11)
RDW: 15.9 % — ABNORMAL HIGH (ref 11.5–15.5)
WBC Count: 2.9 10*3/uL — ABNORMAL LOW (ref 4.0–10.5)
nRBC: 0 % (ref 0.0–0.2)

## 2023-03-28 MED ORDER — DIPHENHYDRAMINE HCL 25 MG PO CAPS
50.0000 mg | ORAL_CAPSULE | Freq: Once | ORAL | Status: AC
  Administered 2023-03-28: 50 mg via ORAL
  Filled 2023-03-28: qty 2

## 2023-03-28 MED ORDER — SODIUM CHLORIDE 0.9% FLUSH
10.0000 mL | Freq: Once | INTRAVENOUS | Status: AC
  Administered 2023-03-28: 10 mL

## 2023-03-28 MED ORDER — FAM-TRASTUZUMAB DERUXTECAN-NXKI CHEMO 100 MG IV SOLR
3.2000 mg/kg | Freq: Once | INTRAVENOUS | Status: AC
  Administered 2023-03-28: 300 mg via INTRAVENOUS
  Filled 2023-03-28: qty 15

## 2023-03-28 MED ORDER — PALONOSETRON HCL INJECTION 0.25 MG/5ML
0.2500 mg | Freq: Once | INTRAVENOUS | Status: AC
  Administered 2023-03-28: 0.25 mg via INTRAVENOUS
  Filled 2023-03-28: qty 5

## 2023-03-28 MED ORDER — SODIUM CHLORIDE 0.9 % IV SOLN
10.0000 mg | Freq: Once | INTRAVENOUS | Status: AC
  Administered 2023-03-28: 10 mg via INTRAVENOUS
  Filled 2023-03-28: qty 10

## 2023-03-28 MED ORDER — ACETAMINOPHEN 325 MG PO TABS
650.0000 mg | ORAL_TABLET | Freq: Once | ORAL | Status: AC
  Administered 2023-03-28: 650 mg via ORAL
  Filled 2023-03-28: qty 2

## 2023-03-28 MED ORDER — HEPARIN SOD (PORK) LOCK FLUSH 100 UNIT/ML IV SOLN
500.0000 [IU] | Freq: Once | INTRAVENOUS | Status: AC | PRN
  Administered 2023-03-28: 500 [IU]

## 2023-03-28 MED ORDER — DEXTROSE 5 % IV SOLN: Freq: Once | INTRAVENOUS | Status: AC

## 2023-03-28 MED ORDER — SODIUM CHLORIDE 0.9% FLUSH
10.0000 mL | INTRAVENOUS | Status: DC | PRN
  Administered 2023-03-28: 10 mL

## 2023-03-28 NOTE — Patient Instructions (Signed)
[facility]  Discharge Instructions: Thank you for choosing [facility] to provide your oncology and hematology care.   If you have a lab appointment with the Cancer Center, please go directly to the Cancer Center and check in at the registration area.   Wear comfortable clothing and clothing appropriate for easy access to any Portacath or PICC line.   We strive to give you quality time with your provider. You may need to reschedule your appointment if you arrive late (15 or more minutes).  Arriving late affects you and other patients whose appointments are after yours.  Also, if you miss three or more appointments without notifying the office, you may be dismissed from the clinic at the provider's discretion.      For prescription refill requests, have your pharmacy contact our office and allow 72 hours for refills to be completed.    Today you received the following chemotherapy and/or immunotherapy agents Enhertu.   To help prevent nausea and vomiting after your treatment, we encourage you to take your nausea medication as directed.  BELOW ARE SYMPTOMS THAT SHOULD BE REPORTED IMMEDIATELY: *FEVER GREATER THAN 100.4 F (38 C) OR HIGHER *CHILLS OR SWEATING *NAUSEA AND VOMITING THAT IS NOT CONTROLLED WITH YOUR NAUSEA MEDICATION *UNUSUAL SHORTNESS OF BREATH *UNUSUAL BRUISING OR BLEEDING *URINARY PROBLEMS (pain or burning when urinating, or frequent urination) *BOWEL PROBLEMS (unusual diarrhea, constipation, pain near the anus) TENDERNESS IN MOUTH AND THROAT WITH OR WITHOUT PRESENCE OF ULCERS (sore throat, sores in mouth, or a toothache) UNUSUAL RASH, SWELLING OR PAIN  UNUSUAL VAGINAL DISCHARGE OR ITCHING   Items with * indicate a potential emergency and should be followed up as soon as possible or go to the Emergency Department if any problems should occur.  Please show the CHEMOTHERAPY ALERT CARD or IMMUNOTHERAPY ALERT CARD at check-in  to the Emergency Department and triage nurse.  Should you have questions after your visit or need to cancel or reschedule your appointment, please contact [facility]  Dept: [phone]  and follow the prompts.  Office hours are 8:00 a.m. to 4:30 p.m. Monday - Friday. Please note that voicemails left after 4:00 p.m. may not be returned until the following business day.  We are closed weekends and major holidays. You have access to a nurse at all times for urgent questions. Please call the main number to the clinic Dept: [phone] and follow the prompts.   For any non-urgent questions, you may also contact your provider using MyChart. We now offer e-Visits for anyone 18 and older to request care online for non-urgent symptoms. For details visit [email or web address].   Also download the MyChart app! Go to the app store, search "MyChart", open the app, select [facility], and log in with your MyChart username and password.   

## 2023-03-28 NOTE — Assessment & Plan Note (Signed)
1.  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 bone metastasis: Currently on Xgeva every 3 months.   CT CAP 02/01/2022: Mild progression of disease with increased number and size of the numerous lung nodules in the right lung.  Persistent lymphadenopathy and right hilar and right paratracheal nodal areas, numerous hypovascular hepatic lesions slightly larger when compared to previous.   CA 27-29: 226 on 02/01/2022 (gradually progressing)   Plan: [facility] molecular testing: ER positive, ER positive, TMB 5, PD-L1 negative, PIK 3 CA negative  -------------------------------------------------------- Current treatment: Enhertu cycle 15 Echocardiogram: 06/14/2022: EF 55 to 60%   Chemo toxicities: 1.   Nausea: Subsided 2. fatigue: Mild to moderate 3.  Leukopenia: Watchful monitoring today's ANC is  4.  Alopecia: Stable 5.  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 New wedge shaped area, stable bone mets Our plan is to obtain another CT scan in June 2024. Return to clinic in 3 weeks for Enhertu

## 2023-03-29 LAB — CANCER ANTIGEN 27.29: CA 27.29: 84.4 U/mL — ABNORMAL HIGH (ref 0.0–38.6)

## 2023-03-31 ENCOUNTER — Other Ambulatory Visit: Payer: Self-pay

## 2023-04-17 NOTE — Progress Notes (Signed)
Patient Care Team: [name], MD as PCP - General (Internal Medicine) [name], MD as PCP - Cardiology (Cardiology) [name], MD as Attending Physician (Radiation Oncology) [name], [name], NP as Nurse Practitioner (Hematology and Oncology) [name], MD as Consulting Physician (General Surgery) [name], DPM as Consulting Physician (Podiatry) [name], MD as Consulting Physician (Gastroenterology) [name], MD as Attending Physician (Hematology and Oncology)  DIAGNOSIS: No diagnosis found.  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[place])   06/21/2022 - 08/03/2022 Chemotherapy   Patient is on Treatment Plan : BREAST METASTATIC fam-trastuzumab deruxtecan-nxki (Enhertu) q21d     06/21/2022 -  Chemotherapy   Patient is on Treatment Plan : BREAST METASTATIC Fam-Trastuzumab Deruxtecan-nxki (Enhertu) (5.4) q21d     Malignant neoplasm of upper-outer quadrant of right breast in female, estrogen receptor positive ([place])  06/21/2016 Initial Diagnosis   Malignant neoplasm of upper-outer quadrant of right breast in female, estrogen receptor positive ([place])   06/21/2016 Initial Biopsy   Right breast biopsy, 10 oclock: IDC, grade 3, ER+(95%), PR+(80%),Ki67 20%, HER-2 negative (ratio 0.67). Right axilla core biopsy: carcinoma, grade 3, ER+(100%), PR+(90%), Ki67 25%, HER-2 negative (ratio 1.13).    07/17/2016 - 11/06/2016 Neo-Adjuvant Chemotherapy   Received 2 cycles of Doxorubicin and Cyclophosphamide, then transitioned to weekly Paclitaxel (due to repeated febrile neutropenia) x 7 cycles, stopped early due to peripheral neuropathy, then completed her final 2 cycles of Doxorubicin and Cyclophosphamide.    12/19/2016 Surgery   Right breast lumpectomy ([place]): IDC, grade 2, 1.6cm and 0.3cm, margins  negative, 3 SLN positive for metastatic carcinoma.     12/26/2016 Surgery   ALND: metastatic carcinoma in one of 20 lymph nodes, and three nodes from previous lumpectomy.  Four positive nodes, consistent with pN2a.   02/07/2017 - 03/21/2017 Radiation Therapy   Adjuvant radiation Isidore [place]): 1) Right breast and nodes - 4 field: 50 Gy in 25 fractions. IM NODES: >95% receive at least 45Gy/9f. 50Gy to SCLV/PAB @ 2Gy /fraction x 25 fractions. 2) Right breast boost: 10 Gy in 5 fractions   04/2017 -  Anti-estrogen oral therapy   Anastrozole '1mg'$  daily.  Bone density 03/23/2017 finds T score of -2.6, osteoporosis, plan to start Prolia following dental clearance Anastrozole stopped 01/16/18 Exemestane 25 mg daily 02/12/18  On [name], trial randomized to endocrine therapy alone   05/27/2020 Progression   chest CT scan 05/27/2020 shows bulky mediastinal and right hilar lymphadenopathy with right pleural nodules and a small right pleural effusion, no evidence of liver or bone involvement             (a) biopsy of right breast mass 06/02/2020 shows invasive ductal carcinoma, estrogen and progesterone receptor positive, HER-2 not amplified, with an MIB-1 of 40%   06/17/2020 Treatment Plan Change    fulvestrant to start 06/17/2020             (a) palbociclib to start 06/17/2020 at 125 mg daily 21 days on 7 days off             (b) palbociclib decreased to '125mg'$  every other day x 11 doses on 07/23/2020             (c) palbociclib dose decreased to100 mg daily, 21/7, starting with September cycle             (d) Palbociclib decreased  to 75mg  daily beginning with March cycle due to oral ulcers             (e) palbociclib discontinued November 2022 with progression   09/14/2021 PET scan   PET scan 09/14/2021 shows progression in liver and bone   10/05/2021 Pathology Results   liver biopsy 10/05/2021 confirms metastatic carcinoma, estrogen receptor strongly positive, progesterone receptor and HER2 negative, with an  MIB-1 of 30%.   10/2021 Treatment Plan Change   fulvestrant continued, Xgeva added, palbociclib changed to abemaciclib November 2022 -Dose Decreased to 50mg  PO BID Daily   03/07/2022 Miscellaneous   [facility] molecular testing: ER positive, ER positive, MTAP: Detected, ESR 1 negative, BRCA 1 and 2 negative, MSI stable, PD-L1 negative, PIK 3 CA negative, PR negative,   06/21/2022 - 08/03/2022 Chemotherapy   Patient is on Treatment Plan : BREAST METASTATIC fam-trastuzumab deruxtecan-nxki (Enhertu) q21d     06/21/2022 -  Chemotherapy   Patient is on Treatment Plan : BREAST METASTATIC Fam-Trastuzumab Deruxtecan-nxki (Enhertu) (5.4) q21d     01/25/2023 Cancer Staging   Staging form: Breast, AJCC 7th Edition - Pathologic: Stage IV (M1) - Signed by Gardenia Phlegm, NP on 01/25/2023   Breast cancer, stage 4 ([place])  03/09/2022 Initial Diagnosis   Breast cancer, stage 4 ([place])   06/21/2022 - 08/03/2022 Chemotherapy   Patient is on Treatment Plan : BREAST METASTATIC fam-trastuzumab deruxtecan-nxki (Enhertu) q21d     06/21/2022 -  Chemotherapy   Patient is on Treatment Plan : BREAST METASTATIC Fam-Trastuzumab Deruxtecan-nxki (Enhertu) (5.4) q21d       CHIEF COMPLIANT:   INTERVAL HISTORY: April [name] is a   ALLERGIES:  is allergic to codeine, cymbalta [duloxetine hcl], hydrocodone, ultram [tramadol hcl], venlafaxine, and gabapentin.  MEDICATIONS:  Current Outpatient Medications  Medication Sig Dispense Refill   benzonatate (TESSALON) 200 MG capsule Take 1 capsule (200 mg total) by mouth 3 (three) times daily as needed for cough. 20 capsule 1   carvedilol (COREG) 3.125 MG tablet Take 1 tablet (3.125 mg total) by mouth 2 (two) times daily. 180 tablet 3   Denosumab (XGEVA N[place]) Inject 120 mg into the skin every 3 (three) months.     fam-trastuzumab deruxtecan-nxki (ENHERTU) 100 MG SOLR Inject 100 mg into the vein every 21 ( twenty-one) days.     loratadine (CLARITIN) 10 MG tablet Take 10 mg by  mouth daily.     naproxen sodium (ALEVE) 220 MG tablet Take 440-660 mg by mouth 2 (two) times daily as needed (pain).     omeprazole (PRILOSEC) 20 MG capsule Take 20 mg by mouth daily.     ondansetron (ZOFRAN) 8 MG tablet as needed for nausea or vomiting.     rizatriptan (MAXALT) 5 MG tablet Take 1 tablet (5 mg total) by mouth as needed for migraine. May repeat in 2 hours if needed 10 tablet 0   simethicone (MYLICON) 0000000 MG chewable tablet Chew 125 mg by mouth every 6 (six) hours as needed for flatulence.     valACYclovir (VALTREX) 1000 MG tablet Take 1 tablet (1,000 mg total) by mouth 2 (two) times daily. 1/2 tab BID (Patient taking differently: Take 500 mg by mouth See admin instructions. Take 500 mg daily, may take a second 500 mg dose as needed for outbreaks) 180 tablet 4   No current facility-administered medications for this visit.    PHYSICAL EXAMINATION: ECOG PERFORMANCE STATUS: {CHL ONC ECOG PS:[phone]}  There were no vitals filed for this visit. There were  no vitals filed for this visit.  BREAST:*** No palpable masses or nodules in either right or left breasts. No palpable axillary supraclavicular or infraclavicular adenopathy no breast tenderness or nipple discharge. (exam performed in the presence of a chaperone)  LABORATORY DATA:  I have reviewed the data as listed    Latest Ref Rng & Units 03/28/2023    9:31 AM 03/08/2023   11:12 AM 02/15/2023    2:33 PM  CMP  Glucose 70 - 99 mg/dL 94  96  96   BUN 6 - 20 mg/dL 15  11  13    Creatinine 0.44 - 1.00 mg/dL 1.61  0.96  0.45   Sodium 135 - 145 mmol/L 140  140  139   Potassium 3.5 - 5.1 mmol/L 3.9  3.9  4.0   Chloride 98 - 111 mmol/L 109  106  107   CO2 22 - 32 mmol/L 27  27  28    Calcium 8.9 - 10.3 mg/dL 8.8  9.7  9.2   Total Protein 6.5 - 8.1 g/dL 6.2  6.9  6.9   Total Bilirubin 0.3 - 1.2 mg/dL 0.5  0.4  0.4   Alkaline Phos 38 - 126 U/L 75  87  84   AST 15 - 41 U/L 26  22  24    ALT 0 - 44 U/L 15  15  15      Lab  Results  Component Value Date   WBC 2.9 (L) 03/28/2023   HGB 11.9 (L) 03/28/2023   HCT 34.5 (L) 03/28/2023   MCV 102.4 (H) 03/28/2023   PLT 182 03/28/2023   NEUTROABS 1.5 (L) 03/28/2023    ASSESSMENT & PLAN:  No problem-specific Assessment & Plan notes found for this encounter.    No orders of the defined types were placed in this encounter.  The patient has a good understanding of the overall plan. she agrees with it. she will call with any problems that may develop before the next visit here. Total time spent: 30 mins including face to face time and time spent for planning, charting and co-ordination of care   Sherlyn Lick, CMA 04/17/23    I Janan Ridge am acting as a Neurosurgeon for [employer]  ***

## 2023-04-18 MED FILL — Dexamethasone Sodium Phosphate Inj 100 MG/10ML: INTRAMUSCULAR | Qty: 1 | Status: AC

## 2023-04-19 ENCOUNTER — Inpatient Hospital Stay (HOSPITAL_BASED_OUTPATIENT_CLINIC_OR_DEPARTMENT_OTHER): Payer: Medicaid Other | Admitting: Hematology and Oncology

## 2023-04-19 ENCOUNTER — Inpatient Hospital Stay: Payer: Medicaid Other | Attending: Oncology

## 2023-04-19 ENCOUNTER — Inpatient Hospital Stay: Payer: Medicaid Other

## 2023-04-19 VITALS — BP 140/94 | HR 77 | Temp 97.7°F | Wt 217.4 lb

## 2023-04-19 DIAGNOSIS — Z79624 Long term (current) use of inhibitors of nucleotide synthesis: Secondary | ICD-10-CM | POA: Insufficient documentation

## 2023-04-19 DIAGNOSIS — Z923 Personal history of irradiation: Secondary | ICD-10-CM | POA: Insufficient documentation

## 2023-04-19 DIAGNOSIS — Z170 Estrogen receptor positive status [ER+]: Secondary | ICD-10-CM

## 2023-04-19 DIAGNOSIS — C50411 Malignant neoplasm of upper-outer quadrant of right female breast: Secondary | ICD-10-CM | POA: Diagnosis not present

## 2023-04-19 DIAGNOSIS — Z5112 Encounter for antineoplastic immunotherapy: Secondary | ICD-10-CM | POA: Insufficient documentation

## 2023-04-19 DIAGNOSIS — Z79811 Long term (current) use of aromatase inhibitors: Secondary | ICD-10-CM | POA: Diagnosis not present

## 2023-04-19 DIAGNOSIS — G629 Polyneuropathy, unspecified: Secondary | ICD-10-CM | POA: Insufficient documentation

## 2023-04-19 DIAGNOSIS — C50911 Malignant neoplasm of unspecified site of right female breast: Secondary | ICD-10-CM | POA: Diagnosis not present

## 2023-04-19 DIAGNOSIS — D72819 Decreased white blood cell count, unspecified: Secondary | ICD-10-CM | POA: Insufficient documentation

## 2023-04-19 DIAGNOSIS — C773 Secondary and unspecified malignant neoplasm of axilla and upper limb lymph nodes: Secondary | ICD-10-CM

## 2023-04-19 DIAGNOSIS — C50919 Malignant neoplasm of unspecified site of unspecified female breast: Secondary | ICD-10-CM

## 2023-04-19 DIAGNOSIS — R110 Nausea: Secondary | ICD-10-CM | POA: Insufficient documentation

## 2023-04-19 DIAGNOSIS — C7951 Secondary malignant neoplasm of bone: Secondary | ICD-10-CM | POA: Insufficient documentation

## 2023-04-19 DIAGNOSIS — R5383 Other fatigue: Secondary | ICD-10-CM | POA: Insufficient documentation

## 2023-04-19 DIAGNOSIS — Z79899 Other long term (current) drug therapy: Secondary | ICD-10-CM | POA: Diagnosis not present

## 2023-04-19 LAB — CBC WITH DIFFERENTIAL (CANCER CENTER ONLY)
Abs Immature Granulocytes: 0.01 10*3/uL (ref 0.00–0.07)
Basophils Absolute: 0 10*3/uL (ref 0.0–0.1)
Basophils Relative: 1 %
Eosinophils Absolute: 0.2 10*3/uL (ref 0.0–0.5)
Eosinophils Relative: 6 %
HCT: 38.4 % (ref 36.0–46.0)
Hemoglobin: 13.5 g/dL (ref 12.0–15.0)
Immature Granulocytes: 0 %
Lymphocytes Relative: 28 %
Lymphs Abs: 0.9 10*3/uL (ref 0.7–4.0)
MCH: 35.7 pg — ABNORMAL HIGH (ref 26.0–34.0)
MCHC: 35.2 g/dL (ref 30.0–36.0)
MCV: 101.6 fL — ABNORMAL HIGH (ref 80.0–100.0)
Monocytes Absolute: 0.5 10*3/uL (ref 0.1–1.0)
Monocytes Relative: 14 %
Neutro Abs: 1.8 10*3/uL (ref 1.7–7.7)
Neutrophils Relative %: 51 %
Platelet Count: 234 10*3/uL (ref 150–400)
RBC: 3.78 MIL/uL — ABNORMAL LOW (ref 3.87–5.11)
RDW: 15.9 % — ABNORMAL HIGH (ref 11.5–15.5)
WBC Count: 3.4 10*3/uL — ABNORMAL LOW (ref 4.0–10.5)
nRBC: 0 % (ref 0.0–0.2)

## 2023-04-19 LAB — CMP (CANCER CENTER ONLY)
ALT: 16 U/L (ref 0–44)
AST: 28 U/L (ref 15–41)
Albumin: 3.8 g/dL (ref 3.5–5.0)
Alkaline Phosphatase: 85 U/L (ref 38–126)
Anion gap: 6 (ref 5–15)
BUN: 12 mg/dL (ref 6–20)
CO2: 26 mmol/L (ref 22–32)
Calcium: 9.3 mg/dL (ref 8.9–10.3)
Chloride: 108 mmol/L (ref 98–111)
Creatinine: 0.69 mg/dL (ref 0.44–1.00)
GFR, Estimated: >60 mL/min (ref >60)
Glucose, Bld: 89 mg/dL (ref 70–99)
Potassium: 4.1 mmol/L (ref 3.5–5.1)
Sodium: 140 mmol/L (ref 135–145)
Total Bilirubin: 0.4 mg/dL (ref 0.3–1.2)
Total Protein: 7 g/dL (ref 6.5–8.1)

## 2023-04-19 MED ORDER — PALONOSETRON HCL INJECTION 0.25 MG/5ML
0.2500 mg | Freq: Once | INTRAVENOUS | Status: AC
  Administered 2023-04-19: 0.25 mg via INTRAVENOUS
  Filled 2023-04-19: qty 5

## 2023-04-19 MED ORDER — SODIUM CHLORIDE 0.9% FLUSH
10.0000 mL | Freq: Once | INTRAVENOUS | Status: AC
  Administered 2023-04-19: 10 mL

## 2023-04-19 MED ORDER — FAM-TRASTUZUMAB DERUXTECAN-NXKI CHEMO 100 MG IV SOLR
3.2000 mg/kg | Freq: Once | INTRAVENOUS | Status: AC
  Administered 2023-04-19: 300 mg via INTRAVENOUS
  Filled 2023-04-19: qty 15

## 2023-04-19 MED ORDER — HEPARIN SOD (PORK) LOCK FLUSH 100 UNIT/ML IV SOLN
500.0000 [IU] | Freq: Once | INTRAVENOUS | Status: AC | PRN
  Administered 2023-04-19: 500 [IU]

## 2023-04-19 MED ORDER — DEXTROSE 5 % IV SOLN: Freq: Once | INTRAVENOUS | Status: AC

## 2023-04-19 MED ORDER — ACETAMINOPHEN 325 MG PO TABS
650.0000 mg | ORAL_TABLET | Freq: Once | ORAL | Status: AC
  Administered 2023-04-19: 650 mg via ORAL
  Filled 2023-04-19: qty 2

## 2023-04-19 MED ORDER — SODIUM CHLORIDE 0.9% FLUSH
10.0000 mL | INTRAVENOUS | Status: DC | PRN
  Administered 2023-04-19: 10 mL

## 2023-04-19 MED ORDER — SODIUM CHLORIDE 0.9 % IV SOLN
10.0000 mg | Freq: Once | INTRAVENOUS | Status: AC
  Administered 2023-04-19: 10 mg via INTRAVENOUS
  Filled 2023-04-19: qty 10

## 2023-04-19 MED ORDER — DIPHENHYDRAMINE HCL 25 MG PO CAPS
50.0000 mg | ORAL_CAPSULE | Freq: Once | ORAL | Status: AC
  Administered 2023-04-19: 50 mg via ORAL
  Filled 2023-04-19: qty 2

## 2023-04-19 NOTE — Assessment & Plan Note (Signed)
1.  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 bone metastasis: Currently on Xgeva every 3 months.   CT CAP 02/01/2022: Mild progression of disease with increased number and size of the numerous lung nodules in the right lung.  Persistent lymphadenopathy and right hilar and right paratracheal nodal areas, numerous hypovascular hepatic lesions slightly larger when compared to previous.   CA 27-29: 226 on 02/01/2022 (gradually progressing)   Plan: [facility] molecular testing: ER positive, ER positive, TMB 5, PD-L1 negative, PIK 3 CA negative  -------------------------------------------------------- Current treatment: Enhertu cycle 15 Echocardiogram: 06/14/2022: EF 55 to 60%   Chemo toxicities: 1.   Nausea: Subsided 2. fatigue: Mild to moderate 3.  Leukopenia: Watchful monitoring today's ANC is  4.  Alopecia: Stable 5.  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  Body aches and pains: Happens 1 to 3 days after each treatment.   CT CAP 02/13/2023:Mixed response (2.1 cm is now 1.9 cm), 1.8 cm is now 1.3 cm, 1 cm is now 0.8 cm, ? New wedge shaped area, stable bone mets   We saw that the tumor markers have been climbing steadily.  03/28/2023: CA 27-29: 84.4 Our plan is to obtain another CT scan in June 2024. Return to clinic in 3 weeks for Enhertu

## 2023-04-19 NOTE — Patient Instructions (Signed)
[facility]  Discharge Instructions: Thank you for choosing [facility] to provide your oncology and hematology care.   If you have a lab appointment with the Cancer Center, please go directly to the Cancer Center and check in at the registration area.   Wear comfortable clothing and clothing appropriate for easy access to any Portacath or PICC line.   We strive to give you quality time with your provider. You may need to reschedule your appointment if you arrive late (15 or more minutes).  Arriving late affects you and other patients whose appointments are after yours.  Also, if you miss three or more appointments without notifying the office, you may be dismissed from the clinic at the provider's discretion.      For prescription refill requests, have your pharmacy contact our office and allow 72 hours for refills to be completed.    Today you received the following chemotherapy and/or immunotherapy agents Enhertu.   To help prevent nausea and vomiting after your treatment, we encourage you to take your nausea medication as directed.  BELOW ARE SYMPTOMS THAT SHOULD BE REPORTED IMMEDIATELY: *FEVER GREATER THAN 100.4 F (38 C) OR HIGHER *CHILLS OR SWEATING *NAUSEA AND VOMITING THAT IS NOT CONTROLLED WITH YOUR NAUSEA MEDICATION *UNUSUAL SHORTNESS OF BREATH *UNUSUAL BRUISING OR BLEEDING *URINARY PROBLEMS (pain or burning when urinating, or frequent urination) *BOWEL PROBLEMS (unusual diarrhea, constipation, pain near the anus) TENDERNESS IN MOUTH AND THROAT WITH OR WITHOUT PRESENCE OF ULCERS (sore throat, sores in mouth, or a toothache) UNUSUAL RASH, SWELLING OR PAIN  UNUSUAL VAGINAL DISCHARGE OR ITCHING   Items with * indicate a potential emergency and should be followed up as soon as possible or go to the Emergency Department if any problems should occur.  Please show the CHEMOTHERAPY ALERT CARD or IMMUNOTHERAPY ALERT CARD at check-in  to the Emergency Department and triage nurse.  Should you have questions after your visit or need to cancel or reschedule your appointment, please contact [facility]  Dept: [phone]  and follow the prompts.  Office hours are 8:00 a.m. to 4:30 p.m. Monday - Friday. Please note that voicemails left after 4:00 p.m. may not be returned until the following business day.  We are closed weekends and major holidays. You have access to a nurse at all times for urgent questions. Please call the main number to the clinic Dept: [phone] and follow the prompts.   For any non-urgent questions, you may also contact your provider using MyChart. We now offer e-Visits for anyone 18 and older to request care online for non-urgent symptoms. For details visit [email or web address].   Also download the MyChart app! Go to the app store, search "MyChart", open the app, select [facility], and log in with your MyChart username and password.   

## 2023-04-20 LAB — CANCER ANTIGEN 27.29: CA 27.29: 126.9 U/mL — ABNORMAL HIGH (ref 0.0–38.6)

## 2023-04-21 ENCOUNTER — Other Ambulatory Visit: Payer: Self-pay

## 2023-04-30 NOTE — Progress Notes (Signed)
Patient Care Team: Julieanne Manson, MD as PCP - General (Internal Medicine) Maisie Fus, MD as PCP - Cardiology (Cardiology) Lonie Peak, MD as Attending Physician (Radiation Oncology) Axel Filler, Larna Daughters, NP as Nurse Practitioner (Hematology and Oncology) Berna Bue, MD as Consulting Physician (General Surgery) Felecia Shelling, DPM as Consulting Physician (Podiatry) Graylin Shiver, MD as Consulting Physician (Gastroenterology) Serena Croissant, MD as Attending Physician (Hematology and Oncology)  DIAGNOSIS:  Encounter Diagnoses  Name Primary?   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)   06/21/2022 - 08/03/2022 Chemotherapy   Patient is on Treatment Plan : BREAST METASTATIC fam-trastuzumab deruxtecan-nxki (Enhertu) q21d     06/21/2022 - 04/19/2023 Chemotherapy   Patient is on Treatment Plan : BREAST METASTATIC Fam-Trastuzumab Deruxtecan-nxki (Enhertu) (5.4) q21d     Malignant neoplasm of upper-outer quadrant of right breast in female, estrogen receptor positive (HCC)  06/21/2016 Initial Diagnosis   Malignant neoplasm of upper-outer quadrant of right breast in female, estrogen receptor positive (HCC)   06/21/2016 Initial Biopsy   Right breast biopsy, 10 oclock: IDC, grade 3, ER+(95%), PR+(80%),Ki67 20%, HER-2 negative (ratio 0.67). Right axilla core biopsy: carcinoma, grade 3, ER+(100%), PR+(90%), Ki67 25%, HER-2 negative (ratio 1.13).    07/17/2016 - 11/06/2016 Neo-Adjuvant Chemotherapy   Received 2 cycles of Doxorubicin and Cyclophosphamide, then transitioned to weekly  Paclitaxel (due to repeated febrile neutropenia) x 7 cycles, stopped early due to peripheral neuropathy, then completed her final 2 cycles of Doxorubicin and Cyclophosphamide.    12/19/2016 Surgery   Right breast lumpectomy ([facility]): IDC, grade 2, 1.6cm and 0.3cm, margins negative, 3 SLN positive for metastatic carcinoma.     12/26/2016 Surgery   ALND: metastatic carcinoma in one of 20 lymph nodes, and three nodes from previous lumpectomy.  Four positive nodes, consistent with pN2a.   02/07/2017 - 03/21/2017 Radiation Therapy   Adjuvant radiation [facility]): 1) Right breast and nodes - 4 field: 50 Gy in 25 fractions. IM NODES: >95% receive at least 45Gy/75fx. 50Gy to SCLV/PAB @ 2Gy /fraction x 25 fractions. 2) Right breast boost: 10 Gy in 5 fractions   04/2017 -  Anti-estrogen oral therapy   Anastrozole 1mg  daily.  Bone density 03/23/2017 finds T score of -2.6, osteoporosis, plan to start Prolia following dental clearance Anastrozole stopped 01/16/18 Exemestane 25 mg daily 02/12/18  On [name], trial randomized to endocrine therapy alone   05/27/2020 Progression   chest CT scan 05/27/2020 shows bulky mediastinal and right hilar lymphadenopathy with right pleural nodules and a small right pleural effusion, no evidence of liver or bone involvement             (a) biopsy of right breast mass 06/02/2020 shows invasive ductal carcinoma, estrogen and progesterone receptor positive, HER-2 not amplified, with an MIB-1 of 40%   06/17/2020 Treatment Plan Change    fulvestrant to start 06/17/2020             (a) palbociclib to start 06/17/2020 at 125 mg daily 21 days on 7 days off             (b) palbociclib decreased to 125mg  every  other day x 11 doses on 07/23/2020             (c) palbociclib dose decreased to100 mg daily, 21/7, starting with September cycle             (d) Palbociclib decreased to 75mg  daily beginning with March cycle due to oral ulcers             (e) palbociclib discontinued November 2022  with progression   09/14/2021 PET scan   PET scan 09/14/2021 shows progression in liver and bone   10/05/2021 Pathology Results   liver biopsy 10/05/2021 confirms metastatic carcinoma, estrogen receptor strongly positive, progesterone receptor and HER2 negative, with an MIB-1 of 30%.   10/2021 Treatment Plan Change   fulvestrant continued, Xgeva added, palbociclib changed to abemaciclib November 2022 -Dose Decreased to 50mg  PO BID Daily   03/07/2022 Miscellaneous   [facility] molecular testing: ER positive, ER positive, MTAP: Detected, ESR 1 negative, BRCA 1 and 2 negative, MSI stable, PD-L1 negative, PIK 3 CA negative, PR negative,   06/21/2022 - 08/03/2022 Chemotherapy   Patient is on Treatment Plan : BREAST METASTATIC fam-trastuzumab deruxtecan-nxki (Enhertu) q21d     06/21/2022 - 04/19/2023 Chemotherapy   Patient is on Treatment Plan : BREAST METASTATIC Fam-Trastuzumab Deruxtecan-nxki (Enhertu) (5.4) q21d     01/25/2023 Cancer Staging   Staging form: Breast, AJCC 7th Edition - Pathologic: Stage IV (M1) - Signed by Loa Socks, NP on 01/25/2023   Breast cancer, stage 4 (HCC)  03/09/2022 Initial Diagnosis   Breast cancer, stage 4 (HCC)   06/21/2022 - 08/03/2022 Chemotherapy   Patient is on Treatment Plan : BREAST METASTATIC fam-trastuzumab deruxtecan-nxki (Enhertu) q21d     06/21/2022 - 04/19/2023 Chemotherapy   Patient is on Treatment Plan : BREAST METASTATIC Fam-Trastuzumab Deruxtecan-nxki (Enhertu) (5.4) q21d       CHIEF COMPLIANT: Follow-up on Cycle 15 Enhertu/ Scans  INTERVAL HISTORY: April [name] is a  62 year old with above-mentioned metastatic breast cancer is currently on Enhertu treatment. She presents to the clinic for a follow-up.    ALLERGIES:  is allergic to codeine, cymbalta [duloxetine hcl], hydrocodone, ultram [tramadol hcl], venlafaxine, and gabapentin.  MEDICATIONS:  Current Outpatient Medications  Medication Sig Dispense Refill   carvedilol (COREG)  3.125 MG tablet Take 1 tablet (3.125 mg total) by mouth 2 (two) times daily. 180 tablet 3   Denosumab (XGEVA [place]) Inject 120 mg into the skin every 3 (three) months.     fam-trastuzumab deruxtecan-nxki (ENHERTU) 100 MG SOLR Inject 100 mg into the vein every 21 ( twenty-one) days.     loratadine (CLARITIN) 10 MG tablet Take 10 mg by mouth daily.     naproxen sodium (ALEVE) 220 MG tablet Take 440-660 mg by mouth 2 (two) times daily as needed (pain).     omeprazole (PRILOSEC) 20 MG capsule Take 20 mg by mouth daily.     ondansetron (ZOFRAN) 8 MG tablet as needed for nausea or vomiting.     rizatriptan (MAXALT) 5 MG tablet Take 1 tablet (5 mg total) by mouth as needed for migraine. May repeat in 2 hours if needed 10 tablet 0   simethicone (MYLICON) 125 MG chewable tablet Chew 125 mg by mouth every 6 (six) hours as needed for flatulence.     valACYclovir (VALTREX) 1000 MG tablet Take 1 tablet (1,000 mg total) by mouth 2 (two) times daily. 1/2 tab BID (Patient taking differently: Take 500 mg by mouth See admin instructions. Take 500 mg daily,  may take a second 500 mg dose as needed for outbreaks) 180 tablet 4   benzonatate (TESSALON) 200 MG capsule Take 1 capsule (200 mg total) by mouth 3 (three) times daily as needed for cough. (Patient not taking: Reported on 05/10/2023) 20 capsule 1   No current facility-administered medications for this visit.    PHYSICAL EXAMINATION: ECOG PERFORMANCE STATUS: 1 - Symptomatic but completely ambulatory  Vitals:   05/10/23 1011  BP: 114/77  Pulse: 72  Resp: 18  Temp: 97.9 F (36.6 C)  SpO2: 99%   Filed Weights   05/10/23 1011  Weight: 216 lb 8 oz (98.2 kg)      LABORATORY DATA:  I have reviewed the data as listed    Latest Ref Rng & Units 05/10/2023    9:41 AM 04/19/2023   10:08 AM 03/28/2023    9:31 AM  CMP  Glucose 70 - 99 mg/dL 409  89  94   BUN 8 - 23 mg/dL 15  12  15    Creatinine 0.44 - 1.00 mg/dL 8.11  9.14  7.82   Sodium 135 - 145 mmol/L 140   140  140   Potassium 3.5 - 5.1 mmol/L 3.8  4.1  3.9   Chloride 98 - 111 mmol/L 110  108  109   CO2 22 - 32 mmol/L 26  26  27    Calcium 8.9 - 10.3 mg/dL 8.9  9.3  8.8   Total Protein 6.5 - 8.1 g/dL 6.4  7.0  6.2   Total Bilirubin 0.3 - 1.2 mg/dL 0.4  0.4  0.5   Alkaline Phos 38 - 126 U/L 90  85  75   AST 15 - 41 U/L 27  28  26    ALT 0 - 44 U/L 17  16  15      Lab Results  Component Value Date   WBC 2.7 (L) 05/10/2023   HGB 12.2 05/10/2023   HCT 35.7 (L) 05/10/2023   MCV 103.8 (H) 05/10/2023   PLT 192 05/10/2023   NEUTROABS 1.5 (L) 05/10/2023    ASSESSMENT & PLAN:  Malignant neoplasm of upper-outer quadrant of right breast in female, estrogen receptor positive (HCC) 1.  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 bone metastasis: Currently on Xgeva every 3 months.   CT CAP 02/01/2022: Mild progression of disease with increased number and size of the numerous lung nodules in the right lung.  Persistent lymphadenopathy and right hilar and right paratracheal nodal areas, numerous hypovascular hepatic lesions slightly larger when compared to previous.   CA 27-29: 226 on 02/01/2022 (gradually progressing)   Plan: [facility] molecular testing: ER positive, ER positive, TMB 5, PD-L1 negative, PIK 3 CA negative  -------------------------------------------------------- Current treatment: Enhertu cycle 16 Echocardiogram: 06/14/2022: EF 55 to 60%   Chemo toxicities: 1.   Nausea: Subsided 2. fatigue: Mild to moderate 3.  Leukopenia: Watchful monitoring today's ANC is  4.  Alopecia: Stable 5.  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  Body aches and pains: Happens 1 to 3 days after each treatment.   We saw that the tumor markers have been climbing steadily.   03/28/2023: CA 27-29: 84.4 04/19/2023: CA 27-29: 126  CT CAP 05/07/2023: Interval increase in size of right hepatic metastases 2.7 cm (to be 1.6 cm) and  cluster of lesions in the left hepatic lobe are similar, bone metastases stable  Based on progression of  disease noted on the CT scan and the labs are recommended switching treatment to Sacituzumab-Govitecan starting next week.  We will start the dose at 5 mg/kg because she has persistent cytopenias already.  I will also like to obtain Guardant360 testing. We discussed risks and benefits of Sacituzumab including the risks of fatigue, diarrhea, leukopenia, nausea, LFT changes, hair loss, neuropathy  Return to clinic in 1 week to start chemotherapy.  I will see her with day 8.    Orders Placed This Encounter  Procedures   Consent Attestation for Oncology Treatment    Order Specific Question:   The patient is informed of risks, benefits, side-effects of the prescribed oncology treatment. Potential short term and long term side effects and response rates discussed. After a long discussion, the patient made informed decision to proceed.    Answer:   Yes   Guardant 360    Standing Status:   Future    Standing Expiration Date:   05/09/2024   CBC with Differential (Cancer Center Only)    Standing Status:   Future    Standing Expiration Date:   05/16/2024   CMP (Cancer Center only)    Standing Status:   Future    Standing Expiration Date:   05/16/2024   Magnesium    Standing Status:   Future    Standing Expiration Date:   05/16/2024   Phosphorus    Standing Status:   Future    Standing Expiration Date:   05/16/2024   CBC with Differential (Cancer Center Only)    Standing Status:   Future    Standing Expiration Date:   05/23/2024   CMP (Cancer Center only)    Standing Status:   Future    Standing Expiration Date:   05/23/2024   Magnesium    Standing Status:   Future    Standing Expiration Date:   05/23/2024   Phosphorus    Standing Status:   Future    Standing Expiration Date:   05/23/2024   CBC with Differential (Cancer Center Only)    Standing Status:   Future    Standing Expiration Date:    06/06/2024   CMP (Cancer Center only)    Standing Status:   Future    Standing Expiration Date:   06/06/2024   Magnesium    Standing Status:   Future    Standing Expiration Date:   06/06/2024   Phosphorus    Standing Status:   Future    Standing Expiration Date:   06/06/2024   CBC with Differential (Cancer Center Only)    Standing Status:   Future    Standing Expiration Date:   06/13/2024   CMP (Cancer Center only)    Standing Status:   Future    Standing Expiration Date:   06/13/2024   Magnesium    Standing Status:   Future    Standing Expiration Date:   06/13/2024   Phosphorus    Standing Status:   Future    Standing Expiration Date:   06/13/2024   CBC with Differential (Cancer Center Only)    Standing Status:   Future    Standing Expiration Date:   06/27/2024   CMP (Cancer Center only)    Standing Status:   Future    Standing Expiration Date:   06/27/2024   Magnesium    Standing Status:   Future    Standing Expiration Date:   06/27/2024   Phosphorus    Standing Status:   Future  Standing Expiration Date:   06/27/2024   CBC with Differential (Cancer Center Only)    Standing Status:   Future    Standing Expiration Date:   07/04/2024   CMP (Cancer Center only)    Standing Status:   Future    Standing Expiration Date:   07/04/2024   Magnesium    Standing Status:   Future    Standing Expiration Date:   07/04/2024   Phosphorus    Standing Status:   Future    Standing Expiration Date:   07/04/2024   CBC with Differential (Cancer Center Only)    Standing Status:   Future    Standing Expiration Date:   07/18/2024   CMP (Cancer Center only)    Standing Status:   Future    Standing Expiration Date:   07/18/2024   Magnesium    Standing Status:   Future    Standing Expiration Date:   07/18/2024   Phosphorus    Standing Status:   Future    Standing Expiration Date:   07/18/2024   CBC with Differential (Cancer Center Only)    Standing Status:   Future    Standing Expiration Date:   07/25/2024    CMP (Cancer Center only)    Standing Status:   Future    Standing Expiration Date:   07/25/2024   Magnesium    Standing Status:   Future    Standing Expiration Date:   07/25/2024   Phosphorus    Standing Status:   Future    Standing Expiration Date:   07/25/2024   Physician communication order    Patients with Reduced UGT1A1 Activity: Individuals who are homozygous for the uridine diphosphate-glucuronosyl transferase 1A1 (UGT1A1)*28 allele are at increased risk for neutropenia following treatment. Avoid concomitant use with UGT1A1 inhibitors or inducers.   The patient has a good understanding of the overall plan. she agrees with it. she will call with any problems that may develop before the next visit here. Total time spent: 30 mins including face to face time and time spent for planning, charting and co-ordination of care   Tamsen Meek, MD 05/10/23    I Janan Ridge am acting as a Neurosurgeon for [employer]  I have reviewed the above documentation for accuracy and completeness, and I agree with the above.

## 2023-05-07 ENCOUNTER — Ambulatory Visit (HOSPITAL_COMMUNITY)
Admission: RE | Admit: 2023-05-07 | Discharge: 2023-05-07 | Disposition: A | Discharge status: Home / Self Care | Payer: Medicaid Other | Source: Ambulatory Visit | Attending: Hematology and Oncology | Admitting: Hematology and Oncology

## 2023-05-07 DIAGNOSIS — C50911 Malignant neoplasm of unspecified site of right female breast: Secondary | ICD-10-CM

## 2023-05-07 DIAGNOSIS — Z170 Estrogen receptor positive status [ER+]: Secondary | ICD-10-CM | POA: Diagnosis present

## 2023-05-07 DIAGNOSIS — C50919 Malignant neoplasm of unspecified site of unspecified female breast: Secondary | ICD-10-CM | POA: Diagnosis not present

## 2023-05-07 DIAGNOSIS — C50411 Malignant neoplasm of upper-outer quadrant of right female breast: Secondary | ICD-10-CM

## 2023-05-07 DIAGNOSIS — C773 Secondary and unspecified malignant neoplasm of axilla and upper limb lymph nodes: Secondary | ICD-10-CM | POA: Diagnosis present

## 2023-05-07 MED ORDER — IOHEXOL 300 MG/ML  SOLN
100.0000 mL | Freq: Once | INTRAMUSCULAR | Status: AC | PRN
  Administered 2023-05-07: 100 mL via INTRAVENOUS

## 2023-05-07 MED ORDER — HEPARIN SOD (PORK) LOCK FLUSH 100 UNIT/ML IV SOLN
INTRAVENOUS | Status: AC
  Filled 2023-05-07: qty 5

## 2023-05-07 MED ORDER — HEPARIN SOD (PORK) LOCK FLUSH 100 UNIT/ML IV SOLN
500.0000 [IU] | Freq: Once | INTRAVENOUS | Status: AC
  Administered 2023-05-07: 500 [IU] via INTRAVENOUS

## 2023-05-07 MED ORDER — SODIUM CHLORIDE (PF) 0.9 % IJ SOLN
INTRAMUSCULAR | Status: AC
  Filled 2023-05-07: qty 50

## 2023-05-09 MED FILL — Dexamethasone Sodium Phosphate Inj 100 MG/10ML: INTRAMUSCULAR | Qty: 1 | Status: AC

## 2023-05-10 ENCOUNTER — Inpatient Hospital Stay: Payer: Medicaid Other

## 2023-05-10 ENCOUNTER — Inpatient Hospital Stay: Payer: Medicaid Other | Attending: Oncology | Admitting: Hematology and Oncology

## 2023-05-10 ENCOUNTER — Other Ambulatory Visit: Payer: Self-pay

## 2023-05-10 ENCOUNTER — Encounter: Payer: Self-pay | Admitting: Hematology and Oncology

## 2023-05-10 VITALS — BP 114/77 | HR 72 | Temp 97.9°F | Resp 18 | Ht 64.0 in | Wt 216.5 lb

## 2023-05-10 DIAGNOSIS — Z170 Estrogen receptor positive status [ER+]: Secondary | ICD-10-CM | POA: Diagnosis not present

## 2023-05-10 DIAGNOSIS — C50919 Malignant neoplasm of unspecified site of unspecified female breast: Secondary | ICD-10-CM | POA: Diagnosis not present

## 2023-05-10 DIAGNOSIS — C50911 Malignant neoplasm of unspecified site of right female breast: Secondary | ICD-10-CM

## 2023-05-10 DIAGNOSIS — Z87891 Personal history of nicotine dependence: Secondary | ICD-10-CM | POA: Insufficient documentation

## 2023-05-10 DIAGNOSIS — G629 Polyneuropathy, unspecified: Secondary | ICD-10-CM | POA: Insufficient documentation

## 2023-05-10 DIAGNOSIS — C50411 Malignant neoplasm of upper-outer quadrant of right female breast: Secondary | ICD-10-CM | POA: Diagnosis not present

## 2023-05-10 DIAGNOSIS — C787 Secondary malignant neoplasm of liver and intrahepatic bile duct: Secondary | ICD-10-CM | POA: Insufficient documentation

## 2023-05-10 DIAGNOSIS — C773 Secondary and unspecified malignant neoplasm of axilla and upper limb lymph nodes: Secondary | ICD-10-CM | POA: Diagnosis not present

## 2023-05-10 DIAGNOSIS — Z8052 Family history of malignant neoplasm of bladder: Secondary | ICD-10-CM | POA: Insufficient documentation

## 2023-05-10 DIAGNOSIS — C7951 Secondary malignant neoplasm of bone: Secondary | ICD-10-CM | POA: Diagnosis not present

## 2023-05-10 DIAGNOSIS — Z5111 Encounter for antineoplastic chemotherapy: Secondary | ICD-10-CM | POA: Diagnosis present

## 2023-05-10 LAB — CBC WITH DIFFERENTIAL (CANCER CENTER ONLY)
Abs Immature Granulocytes: 0 10*3/uL (ref 0.00–0.07)
Basophils Absolute: 0 10*3/uL (ref 0.0–0.1)
Basophils Relative: 0 %
Eosinophils Absolute: 0.1 10*3/uL (ref 0.0–0.5)
Eosinophils Relative: 5 %
HCT: 35.7 % — ABNORMAL LOW (ref 36.0–46.0)
Hemoglobin: 12.2 g/dL (ref 12.0–15.0)
Immature Granulocytes: 0 %
Lymphocytes Relative: 25 %
Lymphs Abs: 0.7 10*3/uL (ref 0.7–4.0)
MCH: 35.5 pg — ABNORMAL HIGH (ref 26.0–34.0)
MCHC: 34.2 g/dL (ref 30.0–36.0)
MCV: 103.8 fL — ABNORMAL HIGH (ref 80.0–100.0)
Monocytes Absolute: 0.4 10*3/uL (ref 0.1–1.0)
Monocytes Relative: 15 %
Neutro Abs: 1.5 10*3/uL — ABNORMAL LOW (ref 1.7–7.7)
Neutrophils Relative %: 55 %
Platelet Count: 192 10*3/uL (ref 150–400)
RBC: 3.44 MIL/uL — ABNORMAL LOW (ref 3.87–5.11)
RDW: 15.8 % — ABNORMAL HIGH (ref 11.5–15.5)
WBC Count: 2.7 10*3/uL — ABNORMAL LOW (ref 4.0–10.5)
nRBC: 0 % (ref 0.0–0.2)

## 2023-05-10 LAB — CMP (CANCER CENTER ONLY)
ALT: 17 U/L (ref 0–44)
AST: 27 U/L (ref 15–41)
Albumin: 3.5 g/dL (ref 3.5–5.0)
Alkaline Phosphatase: 90 U/L (ref 38–126)
Anion gap: 4 — ABNORMAL LOW (ref 5–15)
BUN: 15 mg/dL (ref 8–23)
CO2: 26 mmol/L (ref 22–32)
Calcium: 8.9 mg/dL (ref 8.9–10.3)
Chloride: 110 mmol/L (ref 98–111)
Creatinine: 0.79 mg/dL (ref 0.44–1.00)
GFR, Estimated: >60 mL/min (ref >60)
Glucose, Bld: 112 mg/dL — ABNORMAL HIGH (ref 70–99)
Potassium: 3.8 mmol/L (ref 3.5–5.1)
Sodium: 140 mmol/L (ref 135–145)
Total Bilirubin: 0.4 mg/dL (ref 0.3–1.2)
Total Protein: 6.4 g/dL — ABNORMAL LOW (ref 6.5–8.1)

## 2023-05-10 MED ORDER — HEPARIN SOD (PORK) LOCK FLUSH 100 UNIT/ML IV SOLN
500.0000 [IU] | Freq: Once | INTRAVENOUS | Status: AC
  Administered 2023-05-10: 500 [IU]

## 2023-05-10 MED ORDER — SODIUM CHLORIDE 0.9% FLUSH
10.0000 mL | Freq: Once | INTRAVENOUS | Status: AC
  Administered 2023-05-10: 10 mL

## 2023-05-10 NOTE — Progress Notes (Signed)
DISCONTINUE ON PATHWAY REGIMEN - Breast     A cycle is every 21 days:     Fam-trastuzumab deruxtecan-nxki   **Always confirm dose/schedule in your pharmacy ordering system**  REASON: Disease Progression PRIOR TREATMENT: [id number]: Fam-trastuzumab Deruxtecan 5.4 mg/kg q21 Days TREATMENT RESPONSE: Progressive Disease (PD)  START ON PATHWAY REGIMEN - Breast     A cycle is every 21 days:     Sacituzumab govitecan-hziy   **Always confirm dose/schedule in your pharmacy ordering system**  Patient Characteristics: Distant Metastases or Locoregional Recurrent Disease - Unresected, M0 or Locally Advanced Unresectable Disease Progressing after Neoadjuvant and Local Therapies, M0, HER2 Low/Negative, ER Positive, Chemotherapy, HER2 Low, Third Line and Beyond, [employer] or Not a Candidate for Molecular Targeted Therapy Therapeutic Status: Distant Metastases HER2 Status: Low ER Status: Positive (+) PR Status: Positive (+) Therapy Approach Indicated: Standard Chemotherapy/Endocrine Therapy Line of Therapy: Third Line and Beyond Intent of Therapy: Non-Curative / Palliative Intent, Discussed with Patient

## 2023-05-10 NOTE — Assessment & Plan Note (Addendum)
1.  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 bone metastasis: Currently on Xgeva every 3 months.   CT CAP 02/01/2022: Mild progression of disease with increased number and size of the numerous lung nodules in the right lung.  Persistent lymphadenopathy and right hilar and right paratracheal nodal areas, numerous hypovascular hepatic lesions slightly larger when compared to previous.   CA 27-29: 226 on 02/01/2022 (gradually progressing)   Plan: [facility] molecular testing: ER positive, ER positive, TMB 5, PD-L1 negative, PIK 3 CA negative  -------------------------------------------------------- Current treatment: Enhertu cycle 16 Echocardiogram: 06/14/2022: EF 55 to 60%   Chemo toxicities: 1.   Nausea: Subsided 2. fatigue: Mild to moderate 3.  Leukopenia: Watchful monitoring today's ANC is  4.  Alopecia: Stable 5.  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  Body aches and pains: Happens 1 to 3 days after each treatment.   We saw that the tumor markers have been climbing steadily.   03/28/2023: CA 27-29: 84.4 04/19/2023: CA 27-29: 126  CT CAP 05/07/2023: Interval increase in size of right hepatic metastases 2.7 cm (to be 1.6 cm) and cluster of lesions in the left hepatic lobe are similar, bone metastases stable  Based on progression of disease noted on the CT scan and the labs are recommended switching treatment to Sacituzumab-Govitecan starting next week.  I will also like to obtain Guardant360 testing. We discussed risks and benefits of Sacituzumab including the risks of fatigue, diarrhea, leukopenia, nausea, LFT changes, hair loss, neuropathy  Return to clinic in 1 week to start chemotherapy.  I will see her with day 8.

## 2023-05-11 LAB — CANCER ANTIGEN 27.29: CA 27.29: 119.5 U/mL — ABNORMAL HIGH (ref 0.0–38.6)

## 2023-05-16 DIAGNOSIS — C773 Secondary and unspecified malignant neoplasm of axilla and upper limb lymph nodes: Secondary | ICD-10-CM | POA: Diagnosis not present

## 2023-05-16 DIAGNOSIS — C50919 Malignant neoplasm of unspecified site of unspecified female breast: Secondary | ICD-10-CM | POA: Diagnosis not present

## 2023-05-16 DIAGNOSIS — C50411 Malignant neoplasm of upper-outer quadrant of right female breast: Secondary | ICD-10-CM | POA: Diagnosis not present

## 2023-05-16 DIAGNOSIS — Z170 Estrogen receptor positive status [ER+]: Secondary | ICD-10-CM | POA: Diagnosis not present

## 2023-05-17 MED FILL — Fosaprepitant Dimeglumine For IV Infusion 150 MG (Base Eq): INTRAVENOUS | Qty: 5 | Status: AC

## 2023-05-17 MED FILL — Dexamethasone Sodium Phosphate Inj 100 MG/10ML: INTRAMUSCULAR | Qty: 1 | Status: AC

## 2023-05-18 ENCOUNTER — Inpatient Hospital Stay: Payer: Medicaid Other

## 2023-05-18 ENCOUNTER — Other Ambulatory Visit: Payer: Self-pay

## 2023-05-18 VITALS — BP 132/78 | HR 72 | Temp 98.1°F | Resp 18 | Wt 217.0 lb

## 2023-05-18 DIAGNOSIS — C50911 Malignant neoplasm of unspecified site of right female breast: Secondary | ICD-10-CM

## 2023-05-18 DIAGNOSIS — Z170 Estrogen receptor positive status [ER+]: Secondary | ICD-10-CM

## 2023-05-18 DIAGNOSIS — C50919 Malignant neoplasm of unspecified site of unspecified female breast: Secondary | ICD-10-CM

## 2023-05-18 DIAGNOSIS — Z5111 Encounter for antineoplastic chemotherapy: Secondary | ICD-10-CM | POA: Diagnosis not present

## 2023-05-18 LAB — CBC WITH DIFFERENTIAL (CANCER CENTER ONLY)
Abs Immature Granulocytes: 0.01 10*3/uL (ref 0.00–0.07)
Basophils Absolute: 0 10*3/uL (ref 0.0–0.1)
Basophils Relative: 1 %
Eosinophils Absolute: 0.2 10*3/uL (ref 0.0–0.5)
Eosinophils Relative: 6 %
HCT: 35.9 % — ABNORMAL LOW (ref 36.0–46.0)
Hemoglobin: 12 g/dL (ref 12.0–15.0)
Immature Granulocytes: 0 %
Lymphocytes Relative: 21 %
Lymphs Abs: 0.6 10*3/uL — ABNORMAL LOW (ref 0.7–4.0)
MCH: 35.1 pg — ABNORMAL HIGH (ref 26.0–34.0)
MCHC: 33.4 g/dL (ref 30.0–36.0)
MCV: 105 fL — ABNORMAL HIGH (ref 80.0–100.0)
Monocytes Absolute: 0.4 10*3/uL (ref 0.1–1.0)
Monocytes Relative: 13 %
Neutro Abs: 1.6 10*3/uL — ABNORMAL LOW (ref 1.7–7.7)
Neutrophils Relative %: 59 %
Platelet Count: 178 10*3/uL (ref 150–400)
RBC: 3.42 MIL/uL — ABNORMAL LOW (ref 3.87–5.11)
RDW: 15.1 % (ref 11.5–15.5)
WBC Count: 2.8 10*3/uL — ABNORMAL LOW (ref 4.0–10.5)
nRBC: 0 % (ref 0.0–0.2)

## 2023-05-18 LAB — CMP (CANCER CENTER ONLY)
ALT: 16 U/L (ref 0–44)
AST: 28 U/L (ref 15–41)
Albumin: 3.1 g/dL — ABNORMAL LOW (ref 3.5–5.0)
Alkaline Phosphatase: 81 U/L (ref 38–126)
Anion gap: 6 (ref 5–15)
BUN: 11 mg/dL (ref 8–23)
CO2: 23 mmol/L (ref 22–32)
Calcium: 8.3 mg/dL — ABNORMAL LOW (ref 8.9–10.3)
Chloride: 109 mmol/L (ref 98–111)
Creatinine: 0.65 mg/dL (ref 0.44–1.00)
GFR, Estimated: >60 mL/min (ref >60)
Glucose, Bld: 108 mg/dL — ABNORMAL HIGH (ref 70–99)
Potassium: 3.6 mmol/L (ref 3.5–5.1)
Sodium: 138 mmol/L (ref 135–145)
Total Bilirubin: 0.4 mg/dL (ref 0.3–1.2)
Total Protein: 5.7 g/dL — ABNORMAL LOW (ref 6.5–8.1)

## 2023-05-18 LAB — PHOSPHORUS: Phosphorus: 3.7 mg/dL (ref 2.5–4.6)

## 2023-05-18 LAB — MAGNESIUM: Magnesium: 1.7 mg/dL (ref 1.7–2.4)

## 2023-05-18 MED ORDER — SODIUM CHLORIDE 0.9 % IV SOLN: Freq: Once | INTRAVENOUS | Status: AC

## 2023-05-18 MED ORDER — SODIUM CHLORIDE 0.9% FLUSH
10.0000 mL | INTRAVENOUS | Status: DC | PRN
  Administered 2023-05-18: 10 mL

## 2023-05-18 MED ORDER — FAMOTIDINE IN NACL 20-0.9 MG/50ML-% IV SOLN
20.0000 mg | Freq: Once | INTRAVENOUS | Status: AC
  Administered 2023-05-18: 20 mg via INTRAVENOUS
  Filled 2023-05-18: qty 50

## 2023-05-18 MED ORDER — SODIUM CHLORIDE 0.9% FLUSH
10.0000 mL | Freq: Once | INTRAVENOUS | Status: AC
  Administered 2023-05-18: 10 mL

## 2023-05-18 MED ORDER — HEPARIN SOD (PORK) LOCK FLUSH 100 UNIT/ML IV SOLN
500.0000 [IU] | Freq: Once | INTRAVENOUS | Status: AC | PRN
  Administered 2023-05-18: 500 [IU]

## 2023-05-18 MED ORDER — SODIUM CHLORIDE 0.9 % IV SOLN
150.0000 mg | Freq: Once | INTRAVENOUS | Status: AC
  Administered 2023-05-18: 150 mg via INTRAVENOUS
  Filled 2023-05-18: qty 150

## 2023-05-18 MED ORDER — SODIUM CHLORIDE 0.9 % IV SOLN
5.0000 mg/kg | Freq: Once | INTRAVENOUS | Status: AC
  Administered 2023-05-18: 540 mg via INTRAVENOUS
  Filled 2023-05-18: qty 54

## 2023-05-18 MED ORDER — SODIUM CHLORIDE 0.9 % IV SOLN
10.0000 mg | Freq: Once | INTRAVENOUS | Status: AC
  Administered 2023-05-18: 10 mg via INTRAVENOUS
  Filled 2023-05-18: qty 10

## 2023-05-18 MED ORDER — DIPHENHYDRAMINE HCL 50 MG/ML IJ SOLN
25.0000 mg | Freq: Once | INTRAMUSCULAR | Status: AC
  Administered 2023-05-18: 25 mg via INTRAVENOUS
  Filled 2023-05-18: qty 1

## 2023-05-18 MED ORDER — ACETAMINOPHEN 325 MG PO TABS
650.0000 mg | ORAL_TABLET | Freq: Once | ORAL | Status: AC
  Administered 2023-05-18: 650 mg via ORAL
  Filled 2023-05-18: qty 2

## 2023-05-18 MED ORDER — PALONOSETRON HCL INJECTION 0.25 MG/5ML
0.2500 mg | Freq: Once | INTRAVENOUS | Status: AC
  Administered 2023-05-18: 0.25 mg via INTRAVENOUS
  Filled 2023-05-18: qty 5

## 2023-05-18 NOTE — Patient Instructions (Signed)
[place] [facility]  Discharge Instructions: Thank you for choosing [facility] to provide your oncology and hematology care.   If you have a lab appointment with the Cancer Center, please go directly to the Cancer Center and check in at the registration area.   Wear comfortable clothing and clothing appropriate for easy access to any Portacath or PICC line.   We strive to give you quality time with your provider. You may need to reschedule your appointment if you arrive late (15 or more minutes).  Arriving late affects you and other patients whose appointments are after yours.  Also, if you miss three or more appointments without notifying the office, you may be dismissed from the clinic at the provider's discretion.      For prescription refill requests, have your pharmacy contact our office and allow 72 hours for refills to be completed.    Today you received the following chemotherapy and/or immunotherapy agents Sacituzumab govitecan      To help prevent nausea and vomiting after your treatment, we encourage you to take your nausea medication as directed.  BELOW ARE SYMPTOMS THAT SHOULD BE REPORTED IMMEDIATELY: *FEVER GREATER THAN 100.4 F (38 C) OR HIGHER *CHILLS OR SWEATING *NAUSEA AND VOMITING THAT IS NOT CONTROLLED WITH YOUR NAUSEA MEDICATION *UNUSUAL SHORTNESS OF BREATH *UNUSUAL BRUISING OR BLEEDING *URINARY PROBLEMS (pain or burning when urinating, or frequent urination) *BOWEL PROBLEMS (unusual diarrhea, constipation, pain near the anus) TENDERNESS IN MOUTH AND THROAT WITH OR WITHOUT PRESENCE OF ULCERS (sore throat, sores in mouth, or a toothache) UNUSUAL RASH, SWELLING OR PAIN  UNUSUAL VAGINAL DISCHARGE OR ITCHING   Items with * indicate a potential emergency and should be followed up as soon as possible or go to the Emergency Department if any problems should occur.  Please show the CHEMOTHERAPY ALERT CARD or IMMUNOTHERAPY ALERT  CARD at check-in to the Emergency Department and triage nurse.  Should you have questions after your visit or need to cancel or reschedule your appointment, please contact [place] [facility]  Dept: [phone]  and follow the prompts.  Office hours are 8:00 a.m. to 4:30 p.m. Monday - Friday. Please note that voicemails left after 4:00 p.m. may not be returned until the following business day.  We are closed weekends and major holidays. You have access to a nurse at all times for urgent questions. Please call the main number to the clinic Dept: [phone] and follow the prompts.   For any non-urgent questions, you may also contact your provider using MyChart. We now offer e-Visits for anyone 80 and older to request care online for non-urgent symptoms. For details visit [email or web address].   Also download the MyChart app! Go to the app store, search "MyChart", open the app, select [facility], and log in with your MyChart username and password.  Sacituzumab Govitecan Injection What is this medication? SACITUZUMAB GOVITECAN (SAK i TOOZ ue mab GOE vi TEE kan) treats breast cancer. It may also be used to treat bladder cancer and kidney cancer. It works by blocking a protein that causes cancer cells to grow and multiply. This helps to slow or stop the spread of cancer cells. This medicine may be used for other purposes; ask your health care provider or pharmacist if you have questions. COMMON BRAND NAME(S): TRODELVY What should I tell my care team before I take this medication? They need to know if you have any of these conditions: Carry  the UGT1A1*28 gene Infection Liver disease An unusual or allergic reaction to sacituzumab govitecan, other medications, foods, dyes, or preservatives Pregnant or trying to get pregnant Breast-feeding How should I use this medication? This medication is injected into a vein. It is given by your care team in a hospital or clinic  setting. Talk to your care team about the use of this medication in children. Special care may be needed. Overdosage: If you think you have taken too much of this medicine contact a poison control center or emergency room at once. NOTE: This medicine is only for you. Do not share this medicine with others. What if I miss a dose? Keep appointments for follow-up doses. It is important not to miss your dose. Call your care team if you are unable to keep an appointment. What may interact with this medication? This medication may affect how other medications work, and other medications may affect the way this medication works. Talk with your care team about all of the medications you take. They may suggest changes to your treatment plan to lower the risk of side effects and to make sure your medications work as intended. This list may not describe all possible interactions. Give your health care provider a list of all the medicines, herbs, non-prescription drugs, or dietary supplements you use. Also tell them if you smoke, drink alcohol, or use illegal drugs. Some items may interact with your medicine. What should I watch for while using this medication? This medication may make you feel generally unwell. This is not uncommon as chemotherapy can affect healthy cells as well as cancer cells. Report any side effects. Continue your course of treatment even though you feel ill unless your care team tells you to stop. You may need blood work while you are taking this medication. Certain genetic factors may decrease the safety of this medication. Your care team may use genetic tests to determine treatment. This medication can cause serious allergic reactions. To reduce your risk, your care team may give you other medications to take before receiving this one. Be sure to follow the directions from your care team. Check with your care team if you have severe diarrhea, nausea, and vomiting, or if you sweat a lot.  The loss of too much body fluid may make it dangerous for you to take this medication. Talk to your care team if you wish to become pregnant or think you might be pregnant. This medication can cause serious birth defects if taken during pregnancy or if you get pregnant within 6 months after stopping treatment. A negative pregnancy test is required before starting this medication. A reliable form of contraception is recommended while taking this medication and for 6 months after stopping treatment. Talk to your care team about reliable forms of contraception. Use a condom during sex and for 3 months after stopping treatment. Tell your care team right away if you think your partner might be pregnant. This medication can cause serious birth defects. Do not breast-feed while taking this medication and for 1 month after stopping therapy. This medication may cause infertility. Talk to your care team if you are concerned about your fertility. This medication may increase your risk of getting an infection. Call your care team for advice if you get a fever, chills, sore throat, or other symptoms of a cold or flu. Do not treat yourself. Try to avoid being around people who are sick. Avoid taking medications that contain aspirin, acetaminophen, ibuprofen, naproxen, or ketoprofen unless  instructed by your care team. These medications may hide a fever. This medication may increase blood sugar. The risk may be higher in patients who already have diabetes. Ask your care team what you can do to lower your risk of diabetes while taking this medication. What side effects may I notice from receiving this medication? Side effects that you should report to your care team as soon as possible: Allergic reactions--skin rash, itching, hives, swelling of the face, lips, tongue, or throat Infection--fever, chills, cough, or sore throat Infusion reactions--chest pain, shortness of breath or trouble breathing, feeling faint or  lightheaded Low red blood cell level--unusual weakness or fatigue, dizziness, headache, trouble breathing Severe or prolonged diarrhea Side effects that usually do not require medical attention (report these to your care team if they continue or are bothersome): Constipation Diarrhea Fatigue Hair loss Loss of appetite Nausea Vomiting This list may not describe all possible side effects. Call your doctor for medical advice about side effects. You may report side effects to FDA at 1-800-FDA-1088. Where should I keep my medication? This medication is given in a hospital or clinic. It will not be stored at home. NOTE: This sheet is a summary. It may not cover all possible information. If you have questions about this medicine, talk to your doctor, pharmacist, or health care provider.  2024 [employer] Standard (2022-04-04 00:00:00)

## 2023-05-19 LAB — CANCER ANTIGEN 27.29: CA 27.29: 138.3 U/mL — ABNORMAL HIGH (ref 0.0–38.6)

## 2023-05-21 ENCOUNTER — Encounter: Payer: Self-pay | Admitting: Hematology and Oncology

## 2023-05-21 ENCOUNTER — Encounter: Payer: Self-pay | Admitting: Adult Health

## 2023-05-22 ENCOUNTER — Other Ambulatory Visit: Payer: Self-pay

## 2023-05-23 ENCOUNTER — Telehealth: Payer: Self-pay | Admitting: Hematology and Oncology

## 2023-05-23 LAB — GUARDANT 360

## 2023-05-23 NOTE — Telephone Encounter (Signed)
Scheduled appointments per WQ. Patient is aware of all made appointments. 

## 2023-05-24 ENCOUNTER — Inpatient Hospital Stay (HOSPITAL_BASED_OUTPATIENT_CLINIC_OR_DEPARTMENT_OTHER): Payer: Medicaid Other | Admitting: Adult Health

## 2023-05-24 ENCOUNTER — Inpatient Hospital Stay: Payer: Medicaid Other

## 2023-05-24 ENCOUNTER — Encounter: Payer: Self-pay | Admitting: Adult Health

## 2023-05-24 ENCOUNTER — Other Ambulatory Visit: Payer: Self-pay

## 2023-05-24 DIAGNOSIS — C50411 Malignant neoplasm of upper-outer quadrant of right female breast: Secondary | ICD-10-CM

## 2023-05-24 DIAGNOSIS — Z170 Estrogen receptor positive status [ER+]: Secondary | ICD-10-CM | POA: Diagnosis not present

## 2023-05-24 DIAGNOSIS — C50911 Malignant neoplasm of unspecified site of right female breast: Secondary | ICD-10-CM

## 2023-05-24 DIAGNOSIS — Z5111 Encounter for antineoplastic chemotherapy: Secondary | ICD-10-CM | POA: Diagnosis not present

## 2023-05-24 DIAGNOSIS — C773 Secondary and unspecified malignant neoplasm of axilla and upper limb lymph nodes: Secondary | ICD-10-CM

## 2023-05-24 DIAGNOSIS — C50919 Malignant neoplasm of unspecified site of unspecified female breast: Secondary | ICD-10-CM | POA: Diagnosis not present

## 2023-05-24 LAB — CBC WITH DIFFERENTIAL (CANCER CENTER ONLY)
Abs Immature Granulocytes: 0.02 10*3/uL (ref 0.00–0.07)
Basophils Absolute: 0 10*3/uL (ref 0.0–0.1)
Basophils Relative: 1 %
Eosinophils Absolute: 0.1 10*3/uL (ref 0.0–0.5)
Eosinophils Relative: 3 %
HCT: 36.7 % (ref 36.0–46.0)
Hemoglobin: 12.5 g/dL (ref 12.0–15.0)
Immature Granulocytes: 1 %
Lymphocytes Relative: 23 %
Lymphs Abs: 0.7 10*3/uL (ref 0.7–4.0)
MCH: 35.1 pg — ABNORMAL HIGH (ref 26.0–34.0)
MCHC: 34.1 g/dL (ref 30.0–36.0)
MCV: 103.1 fL — ABNORMAL HIGH (ref 80.0–100.0)
Monocytes Absolute: 0.1 10*3/uL (ref 0.1–1.0)
Monocytes Relative: 5 %
Neutro Abs: 2 10*3/uL (ref 1.7–7.7)
Neutrophils Relative %: 67 %
Platelet Count: 167 10*3/uL (ref 150–400)
RBC: 3.56 MIL/uL — ABNORMAL LOW (ref 3.87–5.11)
RDW: 14.5 % (ref 11.5–15.5)
WBC Count: 2.9 10*3/uL — ABNORMAL LOW (ref 4.0–10.5)
nRBC: 0 % (ref 0.0–0.2)

## 2023-05-24 LAB — CMP (CANCER CENTER ONLY)
ALT: 17 U/L (ref 0–44)
AST: 24 U/L (ref 15–41)
Albumin: 3.5 g/dL (ref 3.5–5.0)
Alkaline Phosphatase: 80 U/L (ref 38–126)
Anion gap: 6 (ref 5–15)
BUN: 10 mg/dL (ref 8–23)
CO2: 27 mmol/L (ref 22–32)
Calcium: 9.1 mg/dL (ref 8.9–10.3)
Chloride: 105 mmol/L (ref 98–111)
Creatinine: 0.84 mg/dL (ref 0.44–1.00)
GFR, Estimated: >60 mL/min (ref >60)
Glucose, Bld: 94 mg/dL (ref 70–99)
Potassium: 3.9 mmol/L (ref 3.5–5.1)
Sodium: 138 mmol/L (ref 135–145)
Total Bilirubin: 0.5 mg/dL (ref 0.3–1.2)
Total Protein: 6.2 g/dL — ABNORMAL LOW (ref 6.5–8.1)

## 2023-05-24 LAB — PHOSPHORUS: Phosphorus: 3.9 mg/dL (ref 2.5–4.6)

## 2023-05-24 LAB — MAGNESIUM: Magnesium: 1.9 mg/dL (ref 1.7–2.4)

## 2023-05-24 MED ORDER — SODIUM CHLORIDE 0.9% FLUSH
10.0000 mL | Freq: Once | INTRAVENOUS | Status: AC
  Administered 2023-05-24: 10 mL

## 2023-05-24 MED ORDER — HEPARIN SOD (PORK) LOCK FLUSH 100 UNIT/ML IV SOLN
500.0000 [IU] | Freq: Once | INTRAVENOUS | Status: AC
  Administered 2023-05-24: 500 [IU]

## 2023-05-24 MED FILL — Dexamethasone Sodium Phosphate Inj 100 MG/10ML: INTRAMUSCULAR | Qty: 1 | Status: AC

## 2023-05-24 MED FILL — Fosaprepitant Dimeglumine For IV Infusion 150 MG (Base Eq): INTRAVENOUS | Qty: 5 | Status: AC
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
## 2023-05-24 NOTE — Assessment & Plan Note (Signed)
April [name] is a 62 year old woman with metastatic breast cancer currently on treatment with [name] that she began on 05/18/2023.  April [name] is tolerating the [place] well.  Her labs are stable.  She has no clinical signs of disease progression.  She will proceed with cycle 1 day 8 of therapy tomorrow.    Her burn is healing well.  She will continue to bandage it as recommended, and knows to call if she develops any signs of infection.  RTC tomorrow for treatment and again in 2 weeks to begin cycle 2 of therapy.

## 2023-05-25 ENCOUNTER — Other Ambulatory Visit: Payer: Self-pay

## 2023-05-25 ENCOUNTER — Other Ambulatory Visit: Payer: Medicaid Other

## 2023-05-25 ENCOUNTER — Inpatient Hospital Stay: Payer: Medicaid Other

## 2023-05-25 ENCOUNTER — Ambulatory Visit: Payer: Medicaid Other | Admitting: Physician Assistant

## 2023-05-25 VITALS — BP 110/72 | HR 78 | Temp 98.2°F | Resp 18 | Wt 214.4 lb

## 2023-05-25 DIAGNOSIS — Z5111 Encounter for antineoplastic chemotherapy: Secondary | ICD-10-CM | POA: Diagnosis not present

## 2023-05-25 DIAGNOSIS — C50911 Malignant neoplasm of unspecified site of right female breast: Secondary | ICD-10-CM

## 2023-05-25 DIAGNOSIS — C50411 Malignant neoplasm of upper-outer quadrant of right female breast: Secondary | ICD-10-CM

## 2023-05-25 DIAGNOSIS — C50919 Malignant neoplasm of unspecified site of unspecified female breast: Secondary | ICD-10-CM

## 2023-05-25 LAB — CANCER ANTIGEN 27.29: CA 27.29: 161.5 U/mL — ABNORMAL HIGH (ref 0.0–38.6)

## 2023-05-25 MED ORDER — HEPARIN SOD (PORK) LOCK FLUSH 100 UNIT/ML IV SOLN: 500.0000 [IU] | Freq: Once | INTRAVENOUS | Status: DC | PRN

## 2023-05-25 MED ORDER — FAMOTIDINE IN NACL 20-0.9 MG/50ML-% IV SOLN
20.0000 mg | Freq: Once | INTRAVENOUS | Status: AC
  Administered 2023-05-25: 20 mg via INTRAVENOUS
  Filled 2023-05-25: qty 50

## 2023-05-25 MED ORDER — PALONOSETRON HCL INJECTION 0.25 MG/5ML
0.2500 mg | Freq: Once | INTRAVENOUS | Status: AC
  Administered 2023-05-25: 0.25 mg via INTRAVENOUS
  Filled 2023-05-25: qty 5

## 2023-05-25 MED ORDER — SODIUM CHLORIDE 0.9 % IV SOLN
5.0000 mg/kg | Freq: Once | INTRAVENOUS | Status: AC
  Administered 2023-05-25: 540 mg via INTRAVENOUS
  Filled 2023-05-25: qty 54

## 2023-05-25 MED ORDER — SODIUM CHLORIDE 0.9 % IV SOLN
10.0000 mg | Freq: Once | INTRAVENOUS | Status: AC
  Administered 2023-05-25: 10 mg via INTRAVENOUS
  Filled 2023-05-25: qty 10

## 2023-05-25 MED ORDER — DIPHENHYDRAMINE HCL 50 MG/ML IJ SOLN
25.0000 mg | Freq: Once | INTRAMUSCULAR | Status: AC
  Administered 2023-05-25: 25 mg via INTRAVENOUS
  Filled 2023-05-25: qty 1

## 2023-05-25 MED ORDER — DENOSUMAB 120 MG/1.7ML ~~LOC~~ SOLN
120.0000 mg | Freq: Once | SUBCUTANEOUS | Status: AC
  Administered 2023-05-25: 120 mg via SUBCUTANEOUS

## 2023-05-25 MED ORDER — SODIUM CHLORIDE 0.9 % IV SOLN
150.0000 mg | Freq: Once | INTRAVENOUS | Status: AC
  Administered 2023-05-25: 150 mg via INTRAVENOUS
  Filled 2023-05-25: qty 150

## 2023-05-25 MED ORDER — SODIUM CHLORIDE 0.9 % IV SOLN: Freq: Once | INTRAVENOUS | Status: AC

## 2023-05-25 MED ORDER — SODIUM CHLORIDE 0.9% FLUSH: 10.0000 mL | INTRAVENOUS | Status: DC | PRN

## 2023-05-25 MED ORDER — ACETAMINOPHEN 325 MG PO TABS
650.0000 mg | ORAL_TABLET | Freq: Once | ORAL | Status: AC
  Administered 2023-05-25: 650 mg via ORAL
  Filled 2023-05-25: qty 2

## 2023-05-27 ENCOUNTER — Emergency Department (HOSPITAL_COMMUNITY)
Admission: EM | Admit: 2023-05-27 | Discharge: 2023-05-27 | Disposition: A | Discharge status: Home / Self Care | Payer: Medicaid Other | Attending: Emergency Medicine | Admitting: Emergency Medicine

## 2023-05-27 ENCOUNTER — Encounter (HOSPITAL_COMMUNITY): Payer: Self-pay

## 2023-05-27 ENCOUNTER — Other Ambulatory Visit: Payer: Self-pay

## 2023-05-27 DIAGNOSIS — Z853 Personal history of malignant neoplasm of breast: Secondary | ICD-10-CM | POA: Diagnosis not present

## 2023-05-27 DIAGNOSIS — D72819 Decreased white blood cell count, unspecified: Secondary | ICD-10-CM | POA: Diagnosis not present

## 2023-05-27 DIAGNOSIS — R9431 Abnormal electrocardiogram [ECG] [EKG]: Secondary | ICD-10-CM | POA: Diagnosis not present

## 2023-05-27 DIAGNOSIS — E876 Hypokalemia: Secondary | ICD-10-CM | POA: Diagnosis not present

## 2023-05-27 DIAGNOSIS — R112 Nausea with vomiting, unspecified: Secondary | ICD-10-CM

## 2023-05-27 DIAGNOSIS — K224 Dyskinesia of esophagus: Secondary | ICD-10-CM | POA: Diagnosis not present

## 2023-05-27 DIAGNOSIS — K219 Gastro-esophageal reflux disease without esophagitis: Secondary | ICD-10-CM | POA: Insufficient documentation

## 2023-05-27 DIAGNOSIS — R0789 Other chest pain: Secondary | ICD-10-CM | POA: Diagnosis not present

## 2023-05-27 LAB — COMPREHENSIVE METABOLIC PANEL
ALT: 20 U/L (ref 0–44)
AST: 24 U/L (ref 15–41)
Albumin: 3.3 g/dL — ABNORMAL LOW (ref 3.5–5.0)
Alkaline Phosphatase: 75 U/L (ref 38–126)
Anion gap: 8 (ref 5–15)
BUN: 16 mg/dL (ref 8–23)
CO2: 22 mmol/L (ref 22–32)
Calcium: 8.5 mg/dL — ABNORMAL LOW (ref 8.9–10.3)
Chloride: 107 mmol/L (ref 98–111)
Creatinine, Ser: 0.63 mg/dL (ref 0.44–1.00)
GFR, Estimated: >60 mL/min (ref >60)
Glucose, Bld: 87 mg/dL (ref 70–99)
Potassium: 3.1 mmol/L — ABNORMAL LOW (ref 3.5–5.1)
Sodium: 137 mmol/L (ref 135–145)
Total Bilirubin: 0.9 mg/dL (ref 0.3–1.2)
Total Protein: 6.6 g/dL (ref 6.5–8.1)

## 2023-05-27 LAB — CBC
HCT: 33.3 % — ABNORMAL LOW (ref 36.0–46.0)
Hemoglobin: 11.2 g/dL — ABNORMAL LOW (ref 12.0–15.0)
MCH: 35.7 pg — ABNORMAL HIGH (ref 26.0–34.0)
MCHC: 33.6 g/dL (ref 30.0–36.0)
MCV: 106.1 fL — ABNORMAL HIGH (ref 80.0–100.0)
Platelets: 152 10*3/uL (ref 150–400)
RBC: 3.14 MIL/uL — ABNORMAL LOW (ref 3.87–5.11)
RDW: 14.7 % (ref 11.5–15.5)
WBC: 1.1 10*3/uL — CL (ref 4.0–10.5)
nRBC: 0 % (ref 0.0–0.2)

## 2023-05-27 LAB — TROPONIN I (HIGH SENSITIVITY): Troponin I (High Sensitivity): 5 ng/L (ref <18)

## 2023-05-27 LAB — LIPASE, BLOOD: Lipase: 30 U/L (ref 11–51)

## 2023-05-27 MED ORDER — HEPARIN SOD (PORK) LOCK FLUSH 100 UNIT/ML IV SOLN
500.0000 [IU] | Freq: Once | INTRAVENOUS | Status: AC
  Administered 2023-05-27: 500 [IU]
  Filled 2023-05-27: qty 5

## 2023-05-27 MED ORDER — PANTOPRAZOLE SODIUM 40 MG IV SOLR
40.0000 mg | Freq: Once | INTRAVENOUS | Status: AC
  Administered 2023-05-27: 40 mg via INTRAVENOUS
  Filled 2023-05-27: qty 10

## 2023-05-27 MED ORDER — DICYCLOMINE HCL 10 MG PO CAPS
20.0000 mg | ORAL_CAPSULE | Freq: Once | ORAL | Status: AC
  Administered 2023-05-27: 20 mg via ORAL
  Filled 2023-05-27: qty 2

## 2023-05-27 MED ORDER — POTASSIUM CHLORIDE CRYS ER 20 MEQ PO TBCR: 20.0000 meq | EXTENDED_RELEASE_TABLET | Freq: Every day | ORAL | 0 refills | Status: DC

## 2023-05-27 MED ORDER — SODIUM CHLORIDE 0.9 % IV BOLUS
1000.0000 mL | Freq: Once | INTRAVENOUS | Status: AC
  Administered 2023-05-27: 1000 mL via INTRAVENOUS

## 2023-05-27 MED ORDER — DICYCLOMINE HCL 20 MG PO TABS: 20.0000 mg | ORAL_TABLET | Freq: Two times a day (BID) | ORAL | 0 refills | Status: DC
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
## 2023-05-27 NOTE — ED Provider Notes (Signed)
[place] EMERGENCY DEPARTMENT AT [facility] Provider Note   CSN: [id number] Arrival date & time: 05/27/23  4098     History  Chief Complaint  Patient presents with   Gastroesophageal Reflux    April [name] is a 62 y.o. female with history of fibromyalgia, anxiety, depression, GERD, arthritis, migraines, neuropathy, osteoporosis, hiatal hernia, breast cancer currently on chemotherapy, who presents to the ER complaining of nausea and vomiting. States she felt she was having a GERD episode yesterday. She was eating chinese food then started vomiting, about 4-5 times. Felt like she was spitting up acid and having esophageal spasms, these have been ongoing since midnight. Tried taking 6 pills of Gaviscon, drinking milk, and drinking vinegar, all without relief. Takes pepcid daily. Did just start a new chemo regimen.    Gastroesophageal Reflux       Home Medications Prior to Admission medications   Medication Sig Start Date End Date Taking? Authorizing Provider  dicyclomine (BENTYL) 20 MG tablet Take 1 tablet (20 mg total) by mouth 2 (two) times daily. 05/27/23  Yes Amonte Brookover T, PA-C  potassium chloride SA (KLOR-CON M) 20 MEQ tablet Take 1 tablet (20 mEq total) by mouth daily for 4 days. 05/27/23 05/31/23 Yes Zacory Fiola T, PA-C  benzonatate (TESSALON) 200 MG capsule Take 1 capsule (200 mg total) by mouth 3 (three) times daily as needed for cough. 12/19/22   Serena Croissant, MD  carvedilol (COREG) 3.125 MG tablet Take 1 tablet (3.125 mg total) by mouth 2 (two) times daily. 12/25/22   Maisie Fus, MD  Denosumab (XGEVA [place]) Inject 120 mg into the skin every 3 (three) months.    [provider]  fam-trastuzumab deruxtecan-nxki (ENHERTU) 100 MG SOLR Inject 100 mg into the vein every 21 ( twenty-one) days.    [provider]  loratadine (CLARITIN) 10 MG tablet Take 10 mg by mouth daily.    [provider]  naproxen sodium (ALEVE) 220 MG tablet  Take 440-660 mg by mouth 2 (two) times daily as needed (pain).    [provider]  omeprazole (PRILOSEC) 20 MG capsule Take 20 mg by mouth daily.    [provider]  ondansetron (ZOFRAN) 8 MG tablet as needed for nausea or vomiting.    [provider]  rizatriptan (MAXALT) 5 MG tablet Take 1 tablet (5 mg total) by mouth as needed for migraine. May repeat in 2 hours if needed 01/09/23   Loa Socks, NP  simethicone (MYLICON) 125 MG chewable tablet Chew 125 mg by mouth every 6 (six) hours as needed for flatulence.    [provider]  valACYclovir (VALTREX) 1000 MG tablet Take 1 tablet (1,000 mg total) by mouth 2 (two) times daily. 1/2 tab BID Patient taking differently: Take 500 mg by mouth See admin instructions. Take 500 mg daily, may take a second 500 mg dose as needed for outbreaks 12/07/21   Loa Socks, NP      Allergies    Codeine, Cymbalta [duloxetine hcl], Hydrocodone, Ultram [tramadol hcl], Venlafaxine, and Gabapentin    Review of Systems   Review of Systems  Gastrointestinal:  Positive for nausea and vomiting.  All other systems reviewed and are negative.   Physical Exam Updated Vital Signs BP 133/77   Pulse 70   Temp 97.6 F (36.4 C) (Oral)   Resp 19   SpO2 98%  Physical Exam Vitals and nursing note reviewed.  Constitutional:      Appearance:  Normal appearance.  HENT:     Head: Normocephalic and atraumatic.  Eyes:     Conjunctiva/sclera: Conjunctivae normal.  Cardiovascular:     Rate and Rhythm: Normal rate and regular rhythm.  Pulmonary:     Effort: Pulmonary effort is normal. No respiratory distress.     Breath sounds: Normal breath sounds.  Chest:     Comments: Port in right chest Abdominal:     General: There is no distension.     Palpations: Abdomen is soft.     Tenderness: There is no abdominal tenderness.  Skin:    General: Skin is warm and dry.  Neurological:     General: No focal deficit  present.     Mental Status: She is alert.     ED Results / Procedures / Treatments   Labs (all labs ordered are listed, but only abnormal results are displayed) Labs Reviewed  CBC - Abnormal; Notable for the following components:      Result Value   WBC 1.1 (*)    RBC 3.14 (*)    Hemoglobin 11.2 (*)    HCT 33.3 (*)    MCV 106.1 (*)    MCH 35.7 (*)    All other components within normal limits  COMPREHENSIVE METABOLIC PANEL - Abnormal; Notable for the following components:   Potassium 3.1 (*)    Calcium 8.5 (*)    Albumin 3.3 (*)    All other components within normal limits  LIPASE, BLOOD  TROPONIN I (HIGH SENSITIVITY)    EKG EKG Interpretation  Date/Time:  Sunday May 27 2023 08:12:48 EDT Ventricular Rate:  80 PR Interval:  157 QRS Duration: 91 QT Interval:  384 QTC Calculation: 443 R Axis:   70 Text Interpretation: Sinus rhythm Abnormal R-wave progression, early transition Confirmed by Gloris Manchester [phone]) on 05/27/2023 8:58:57 AM  Radiology No results found.  Procedures Procedures    Medications Ordered in ED Medications  sodium chloride 0.9 % bolus 1,000 mL (0 mLs Intravenous Stopped 05/27/23 1121)  pantoprazole (PROTONIX) injection 40 mg (40 mg Intravenous Given 05/27/23 0807)  dicyclomine (BENTYL) capsule 20 mg (20 mg Oral Given 05/27/23 1026)  heparin lock flush 100 unit/mL (500 Units Intracatheter Given 05/27/23 1122)    ED Course/ Medical Decision Making/ A&P                             Medical Decision Making Amount and/or Complexity of Data Reviewed Labs: ordered.  Risk Prescription drug management.   This patient is a 62 y.o. female  who presents to the ED for concern of esophageal spasms, nausea or vomiting.   Differential diagnoses prior to evaluation: The emergent differential diagnosis includes, but is not limited to,  ACS/MI, Boerhaave's, DKA, Ischemic bowel, Sepsis, Drug-related (toxicity, THC hyperemesis, ETOH, withdrawal), Appendicitis,  Bowel obstruction, Electrolyte abnormalities, Pancreatitis, Biliary colic, Gastroenteritis, Gastroparesis, Hepatitis, GERD/PUD. This is not an exhaustive differential.   Past Medical History / Co-morbidities / Social History: fibromyalgia, anxiety, depression, GERD, arthritis, migraines, neuropathy, osteoporosis, hiatal hernia, breast cancer currently on chemotherapy  Additional history: Chart reviewed. Pertinent results include: Follows with Dr Pamelia Hoit of oncology, has chemo infusions every 2 weeks, next appointment on 7/5  Physical Exam: Physical exam performed. The pertinent findings include: Normal vital signs, no acute distress.  Abdomen soft and nontender.  Lab Tests/Imaging studies: I personally interpreted labs/imaging and the pertinent results include: Leukopenia 1.1, hemoglobin 11.2.  Changes are grossly at baseline,  could be influenced by recent chemo regimen.  Potassium 3.1, otherwise CMP unremarkable.  Normal lipase.  Negative troponin.  Cardiac monitoring: EKG obtained and interpreted by myself and attending physician which shows: Sinus rhythm, abnormal R wave progression   Medications: I ordered medication including IV fluids, Protonix, Bentyl.  I have reviewed the patients home medicines and have made adjustments as needed.  Upon reevaluation patient feels significantly improved, still having some spasms, but not nearly as bad as before.  Passed p.o. challenge.   Disposition: After consideration of the diagnostic results and the patients response to treatment, I feel that emergency department workup does not suggest an emergent condition requiring admission or immediate intervention beyond what has been performed at this time. The plan is: Discharge home with symptomatic management of likely discomfort related to esophageal spasms, in the setting of GERD.  Cannot rule out some contributing factors of recent change in chemo regimen.  Patient clinically appears well, nontoxic, does  not meet SIRS.  Will recommend following up with oncology and GI, as I think she would benefit from an endoscopy.  Will discharge to home with Bentyl trial, and recommend continuing PPI.  The patient is safe for discharge and has been instructed to return immediately for worsening symptoms, change in symptoms or any other concerns.  Final Clinical Impression(s) / ED Diagnoses Final diagnoses:  Esophageal spasm  Nausea and vomiting, unspecified vomiting type  Gastroesophageal reflux disease without esophagitis    Rx / DC Orders ED Discharge Orders          Ordered    potassium chloride SA (KLOR-CON M) 20 MEQ tablet  Daily        05/27/23 1111    dicyclomine (BENTYL) 20 MG tablet  2 times daily        05/27/23 1111           Portions of this report may have been transcribed using voice recognition software. Every effort was made to ensure accuracy; however, inadvertent computerized transcription errors may be present.    Jeanella Flattery 05/27/23 1140    Gloris Manchester, MD 05/27/23 1246

## 2023-05-27 NOTE — Discharge Instructions (Signed)
You are seen in the emergency department today for vomiting and esophageal spasms.  As we discussed your blood work looked reassuring.  Your white blood cell count and hemoglobin were lower, but I think this is in the setting of your new chemo regimen.  I recommend discussing this with your oncologist.  We gave you a medication to help with esophageal spasms, I sent a prescription for the same to your pharmacy.  You can take this twice daily.  Your potassium level is mildly low, have also sent some replacement to the pharmacy for you to take for the next several days.  I have attached the contact information for the gastroenterologist, who I recommend following up with.  You can give them a call to schedule an appointment.  If you saw a previous provider in the past and would like to stick with that practice, I recommend reaching out to them.  Continue to monitor how you're doing and return to the ER for new or worsening symptoms.

## 2023-05-31 ENCOUNTER — Other Ambulatory Visit: Payer: Self-pay

## 2023-05-31 ENCOUNTER — Inpatient Hospital Stay: Payer: Medicaid Other

## 2023-05-31 ENCOUNTER — Inpatient Hospital Stay: Payer: Medicaid Other | Admitting: Hematology and Oncology

## 2023-06-06 MED FILL — Dexamethasone Sodium Phosphate Inj 100 MG/10ML: INTRAMUSCULAR | Qty: 1 | Status: AC

## 2023-06-06 MED FILL — Fosaprepitant Dimeglumine For IV Infusion 150 MG (Base Eq): INTRAVENOUS | Qty: 5 | Status: AC

## 2023-06-08 ENCOUNTER — Other Ambulatory Visit: Payer: Self-pay

## 2023-06-08 ENCOUNTER — Other Ambulatory Visit: Payer: Self-pay | Admitting: Hematology and Oncology

## 2023-06-08 ENCOUNTER — Inpatient Hospital Stay: Payer: Medicaid Other | Attending: Oncology

## 2023-06-08 ENCOUNTER — Inpatient Hospital Stay: Payer: Medicaid Other

## 2023-06-08 VITALS — BP 100/80 | HR 80 | Temp 98.0°F | Resp 18

## 2023-06-08 DIAGNOSIS — C773 Secondary and unspecified malignant neoplasm of axilla and upper limb lymph nodes: Secondary | ICD-10-CM | POA: Insufficient documentation

## 2023-06-08 DIAGNOSIS — D61818 Other pancytopenia: Secondary | ICD-10-CM | POA: Insufficient documentation

## 2023-06-08 DIAGNOSIS — C50911 Malignant neoplasm of unspecified site of right female breast: Secondary | ICD-10-CM

## 2023-06-08 DIAGNOSIS — C7951 Secondary malignant neoplasm of bone: Secondary | ICD-10-CM | POA: Diagnosis not present

## 2023-06-08 DIAGNOSIS — C50411 Malignant neoplasm of upper-outer quadrant of right female breast: Secondary | ICD-10-CM | POA: Insufficient documentation

## 2023-06-08 DIAGNOSIS — Z170 Estrogen receptor positive status [ER+]: Secondary | ICD-10-CM | POA: Diagnosis not present

## 2023-06-08 DIAGNOSIS — R5383 Other fatigue: Secondary | ICD-10-CM | POA: Diagnosis not present

## 2023-06-08 DIAGNOSIS — C787 Secondary malignant neoplasm of liver and intrahepatic bile duct: Secondary | ICD-10-CM | POA: Insufficient documentation

## 2023-06-08 DIAGNOSIS — Z79624 Long term (current) use of inhibitors of nucleotide synthesis: Secondary | ICD-10-CM | POA: Diagnosis not present

## 2023-06-08 DIAGNOSIS — C50919 Malignant neoplasm of unspecified site of unspecified female breast: Secondary | ICD-10-CM

## 2023-06-08 DIAGNOSIS — Z79899 Other long term (current) drug therapy: Secondary | ICD-10-CM | POA: Insufficient documentation

## 2023-06-08 DIAGNOSIS — Z79811 Long term (current) use of aromatase inhibitors: Secondary | ICD-10-CM | POA: Insufficient documentation

## 2023-06-08 DIAGNOSIS — Z5111 Encounter for antineoplastic chemotherapy: Secondary | ICD-10-CM | POA: Diagnosis present

## 2023-06-08 LAB — CMP (CANCER CENTER ONLY)
ALT: 25 U/L (ref 0–44)
AST: 24 U/L (ref 15–41)
Albumin: 3.2 g/dL — ABNORMAL LOW (ref 3.5–5.0)
Alkaline Phosphatase: 105 U/L (ref 38–126)
Anion gap: 9 (ref 5–15)
BUN: 6 mg/dL — ABNORMAL LOW (ref 8–23)
CO2: 24 mmol/L (ref 22–32)
Calcium: 8.6 mg/dL — ABNORMAL LOW (ref 8.9–10.3)
Chloride: 108 mmol/L (ref 98–111)
Creatinine: 0.77 mg/dL (ref 0.44–1.00)
GFR, Estimated: >60 mL/min (ref >60)
Glucose, Bld: 88 mg/dL (ref 70–99)
Potassium: 3.6 mmol/L (ref 3.5–5.1)
Sodium: 141 mmol/L (ref 135–145)
Total Bilirubin: 0.3 mg/dL (ref 0.3–1.2)
Total Protein: 6.1 g/dL — ABNORMAL LOW (ref 6.5–8.1)

## 2023-06-08 LAB — CBC WITH DIFFERENTIAL (CANCER CENTER ONLY)
Abs Immature Granulocytes: 0.01 10*3/uL (ref 0.00–0.07)
Basophils Absolute: 0 10*3/uL (ref 0.0–0.1)
Basophils Relative: 1 %
Eosinophils Absolute: 0.1 10*3/uL (ref 0.0–0.5)
Eosinophils Relative: 4 %
HCT: 36.5 % (ref 36.0–46.0)
Hemoglobin: 12.5 g/dL (ref 12.0–15.0)
Immature Granulocytes: 1 %
Lymphocytes Relative: 32 %
Lymphs Abs: 0.7 10*3/uL (ref 0.7–4.0)
MCH: 34.8 pg — ABNORMAL HIGH (ref 26.0–34.0)
MCHC: 34.2 g/dL (ref 30.0–36.0)
MCV: 101.7 fL — ABNORMAL HIGH (ref 80.0–100.0)
Monocytes Absolute: 0.3 10*3/uL (ref 0.1–1.0)
Monocytes Relative: 14 %
Neutro Abs: 1.1 10*3/uL — ABNORMAL LOW (ref 1.7–7.7)
Neutrophils Relative %: 48 %
Platelet Count: 346 10*3/uL (ref 150–400)
RBC: 3.59 MIL/uL — ABNORMAL LOW (ref 3.87–5.11)
RDW: 13.8 % (ref 11.5–15.5)
WBC Count: 2.2 10*3/uL — ABNORMAL LOW (ref 4.0–10.5)
nRBC: 0 % (ref 0.0–0.2)

## 2023-06-08 LAB — MAGNESIUM: Magnesium: 1.9 mg/dL (ref 1.7–2.4)

## 2023-06-08 LAB — PHOSPHORUS: Phosphorus: 3.8 mg/dL (ref 2.5–4.6)

## 2023-06-08 MED ORDER — HEPARIN SOD (PORK) LOCK FLUSH 100 UNIT/ML IV SOLN
500.0000 [IU] | Freq: Once | INTRAVENOUS | Status: AC
  Administered 2023-06-08: 500 [IU]

## 2023-06-08 MED ORDER — SODIUM CHLORIDE 0.9% FLUSH: 10.0000 mL | Freq: Once | INTRAVENOUS | Status: DC

## 2023-06-08 MED ORDER — SODIUM CHLORIDE 0.9 % IV SOLN: INTRAVENOUS | Status: AC

## 2023-06-08 MED ORDER — SODIUM CHLORIDE 0.9% FLUSH
10.0000 mL | Freq: Once | INTRAVENOUS | Status: AC
  Administered 2023-06-08: 10 mL

## 2023-06-08 NOTE — Progress Notes (Signed)
Pt requested not to have chemotherapy tx today, but receive fluids instead. Pt reported feeling fatigued and weak. Per Bertis Ruddy MD, ok to give 1L NS over 2 hours today and defer treatment.

## 2023-06-08 NOTE — Patient Instructions (Signed)

## 2023-06-12 ENCOUNTER — Other Ambulatory Visit: Payer: Self-pay

## 2023-06-14 ENCOUNTER — Ambulatory Visit: Payer: Medicaid Other

## 2023-06-14 MED FILL — Dexamethasone Sodium Phosphate Inj 100 MG/10ML: INTRAMUSCULAR | Qty: 1 | Status: AC

## 2023-06-14 MED FILL — Fosaprepitant Dimeglumine For IV Infusion 150 MG (Base Eq): INTRAVENOUS | Qty: 5 | Status: AC

## 2023-06-14 NOTE — Progress Notes (Signed)
Patient Care Team: Julieanne Manson, MD as PCP - General (Internal Medicine) Maisie Fus, MD as PCP - Cardiology (Cardiology) Lonie Peak, MD as Attending Physician (Radiation Oncology) Axel Filler, Larna Daughters, NP as Nurse Practitioner (Hematology and Oncology) Berna Bue, MD as Consulting Physician (General Surgery) Felecia Shelling, DPM as Consulting Physician (Podiatry) Graylin Shiver, MD as Consulting Physician (Gastroenterology) Serena Croissant, MD as Attending Physician (Hematology and Oncology)  DIAGNOSIS:  Encounter Diagnoses  Name Primary?   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)   06/21/2022 - 08/03/2022 Chemotherapy   Patient is on Treatment Plan : BREAST METASTATIC fam-trastuzumab deruxtecan-nxki (Enhertu) q21d     06/21/2022 - 04/19/2023 Chemotherapy   Patient is on Treatment Plan : BREAST METASTATIC Fam-Trastuzumab Deruxtecan-nxki (Enhertu) (5.4) q21d     05/18/2023 -  Chemotherapy   Patient is on Treatment Plan : BREAST METASTATIC Sacituzumab govitecan-hziy [name]) D1,8 q21d     Malignant neoplasm of upper-outer quadrant of right breast in female, estrogen receptor positive (HCC)  06/21/2016 Initial Diagnosis   Malignant neoplasm of upper-outer quadrant of right breast in female, estrogen receptor positive (HCC)   06/21/2016 Initial Biopsy   Right breast biopsy, 10 oclock: IDC, grade 3, ER+(95%), PR+(80%),Ki67 20%, HER-2 negative (ratio 0.67). Right axilla core biopsy: carcinoma, grade 3, ER+(100%), PR+(90%), Ki67 25%, HER-2 negative (ratio 1.13).     07/17/2016 - 11/06/2016 Neo-Adjuvant Chemotherapy   Received 2 cycles of Doxorubicin and Cyclophosphamide, then transitioned to weekly Paclitaxel (due to repeated febrile neutropenia) x 7 cycles, stopped early due to peripheral neuropathy, then completed her final 2 cycles of Doxorubicin and Cyclophosphamide.    12/19/2016 Surgery   Right breast lumpectomy ([facility]): IDC, grade 2, 1.6cm and 0.3cm, margins negative, 3 SLN positive for metastatic carcinoma.     12/26/2016 Surgery   ALND: metastatic carcinoma in one of 20 lymph nodes, and three nodes from previous lumpectomy.  Four positive nodes, consistent with pN2a.   02/07/2017 - 03/21/2017 Radiation Therapy   Adjuvant radiation Basilio [place]): 1) Right breast and nodes - 4 field: 50 Gy in 25 fractions. IM NODES: >95% receive at least 45Gy/32fx. 50Gy to SCLV/PAB @ 2Gy /fraction x 25 fractions. 2) Right breast boost: 10 Gy in 5 fractions   04/2017 -  Anti-estrogen oral therapy   Anastrozole 1mg  daily.  Bone density 03/23/2017 finds T score of -2.6, osteoporosis, plan to start Prolia following dental clearance Anastrozole stopped 01/16/18 Exemestane 25 mg daily 02/12/18  On [name], trial randomized to endocrine therapy alone   05/27/2020 Progression   chest CT scan 05/27/2020 shows bulky mediastinal and right hilar lymphadenopathy with right pleural nodules and a small right pleural effusion, no evidence of liver or bone involvement             (a) biopsy of right breast mass 06/02/2020 shows invasive ductal carcinoma, estrogen and progesterone receptor positive, HER-2 not amplified, with an MIB-1 of 40%   06/17/2020 Treatment Plan Change    fulvestrant to start 06/17/2020             (a) palbociclib to start 06/17/2020 at 125 mg daily 21  days on 7 days off             (b) palbociclib decreased to 125mg  every other day x 11 doses on 07/23/2020             (c) palbociclib dose decreased to100 mg daily, 21/7, starting with September cycle             (d)  Palbociclib decreased to 75mg  daily beginning with March cycle due to oral ulcers             (e) palbociclib discontinued November 2022 with progression   09/14/2021 PET scan   PET scan 09/14/2021 shows progression in liver and bone   10/05/2021 Pathology Results   liver biopsy 10/05/2021 confirms metastatic carcinoma, estrogen receptor strongly positive, progesterone receptor and HER2 negative, with an MIB-1 of 30%.   10/2021 Treatment Plan Change   fulvestrant continued, Xgeva added, palbociclib changed to abemaciclib November 2022 -Dose Decreased to 50mg  PO BID Daily   03/07/2022 Miscellaneous   [facility] molecular testing: ER positive, ER positive, MTAP: Detected, ESR 1 negative, BRCA 1 and 2 negative, MSI stable, PD-L1 negative, PIK 3 CA negative, PR negative,   06/21/2022 - 08/03/2022 Chemotherapy   Patient is on Treatment Plan : BREAST METASTATIC fam-trastuzumab deruxtecan-nxki (Enhertu) q21d     06/21/2022 - 04/19/2023 Chemotherapy   Patient is on Treatment Plan : BREAST METASTATIC Fam-Trastuzumab Deruxtecan-nxki (Enhertu) (5.4) q21d     01/25/2023 Cancer Staging   Staging form: Breast, AJCC 7th Edition - Pathologic: Stage IV (M1) - Signed by Loa Socks, NP on 01/25/2023   05/18/2023 -  Chemotherapy   Patient is on Treatment Plan : BREAST METASTATIC Sacituzumab govitecan-hziy [name]) D1,8 q21d     Breast cancer, stage 4 (HCC)  03/09/2022 Initial Diagnosis   Breast cancer, stage 4 (HCC)   06/21/2022 - 08/03/2022 Chemotherapy   Patient is on Treatment Plan : BREAST METASTATIC fam-trastuzumab deruxtecan-nxki (Enhertu) q21d     06/21/2022 - 04/19/2023 Chemotherapy   Patient is on Treatment Plan : BREAST METASTATIC Fam-Trastuzumab Deruxtecan-nxki (Enhertu) (5.4) q21d     05/18/2023 -  Chemotherapy   Patient is on Treatment Plan : BREAST METASTATIC Sacituzumab govitecan-hziy [name]) D1,8 q21d       CHIEF COMPLIANT: Follow-up cycle 2 Trodelvy.    INTERVAL HISTORY:  [name] is a 62 year old with above-mentioned metastatic breast cancer is currently on Enhertu treatment. She presents to the clinic for a follow-up. Pt reports she could not tolerate the Trodelvy. She states it made her feel really bad. She feels weak and dizzy. She says she also feels off. She was having esophagus spasm. She had mouth ulcers causing her not to be able to eat.   ALLERGIES:  is allergic to codeine, cymbalta [duloxetine hcl], hydrocodone, ultram [tramadol hcl], venlafaxine, and gabapentin.  MEDICATIONS:  Current Outpatient Medications  Medication Sig Dispense Refill   carvedilol (COREG) 3.125 MG tablet Take 1 tablet (3.125 mg total) by mouth 2 (two) times daily. 180 tablet 3   Denosumab (XGEVA [place]) Inject 120 mg into the skin every 3 (three) months.     dicyclomine (BENTYL) 20 MG tablet Take 1 tablet (20 mg total) by mouth 2 (two) times daily. 20 tablet 0   fam-trastuzumab deruxtecan-nxki (ENHERTU) 100 MG SOLR Inject 100 mg into the vein every 21 ( twenty-one) days.     loratadine (CLARITIN) 10 MG tablet Take 10 mg by mouth daily.     naproxen sodium (ALEVE) 220 MG tablet  Take 440-660 mg by mouth 2 (two) times daily as needed (pain).     omeprazole (PRILOSEC) 20 MG capsule Take 20 mg by mouth daily.     ondansetron (ZOFRAN) 8 MG tablet as needed for nausea or vomiting.     rizatriptan (MAXALT) 5 MG tablet Take 1 tablet (5 mg total) by mouth as needed for migraine. May repeat in 2 hours if needed 10 tablet 0   simethicone (MYLICON) 125 MG chewable tablet Chew 125 mg by mouth every 6 (six) hours as needed for flatulence.     valACYclovir (VALTREX) 1000 MG tablet Take 1 tablet (1,000 mg total) by mouth 2 (two) times daily. 1/2 tab BID (Patient taking differently: Take 500 mg by mouth See admin instructions. Take 500 mg daily, may take a second 500 mg dose as needed for outbreaks) 180 tablet 4   potassium chloride SA (KLOR-CON M) 20 MEQ tablet Take 1 tablet (20 mEq total) by  mouth daily for 4 days. 4 tablet 0   No current facility-administered medications for this visit.    PHYSICAL EXAMINATION: ECOG PERFORMANCE STATUS: 2 - Symptomatic, <50% confined to bed  Vitals:   06/15/23 0843  BP: (!) 137/90  Pulse: 88  Resp: 15  Temp: 98.7 F (37.1 C)  SpO2: 97%   Filed Weights   06/15/23 0843  Weight: 214 lb 1.6 oz (97.1 kg)      LABORATORY DATA:  I have reviewed the data as listed    Latest Ref Rng & Units 06/15/2023    8:29 AM 06/08/2023    7:37 AM 05/27/2023    8:12 AM  CMP  Glucose 70 - 99 mg/dL 99  88  87   BUN 8 - 23 mg/dL 14  6  16    Creatinine 0.44 - 1.00 mg/dL 1.61  0.96  0.45   Sodium 135 - 145 mmol/L 139  141  137   Potassium 3.5 - 5.1 mmol/L 3.9  3.6  3.1   Chloride 98 - 111 mmol/L 107  108  107   CO2 22 - 32 mmol/L 26  24  22    Calcium 8.9 - 10.3 mg/dL 9.1  8.6  8.5   Total Protein 6.5 - 8.1 g/dL 6.5  6.1  6.6   Total Bilirubin 0.3 - 1.2 mg/dL 0.3  0.3  0.9   Alkaline Phos 38 - 126 U/L 85  105  75   AST 15 - 41 U/L 22  24  24    ALT 0 - 44 U/L 16  25  20      Lab Results  Component Value Date   WBC 3.1 (L) 06/15/2023   HGB 11.9 (L) 06/15/2023   HCT 35.1 (L) 06/15/2023   MCV 103.2 (H) 06/15/2023   PLT 224 06/15/2023   NEUTROABS 2.0 06/15/2023    ASSESSMENT & PLAN:  Malignant neoplasm of upper-outer quadrant of right breast in female, estrogen receptor positive (HCC) 1.  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 bone metastasis: Currently on Xgeva every 3 months.   CT CAP 02/01/2022: Mild progression of disease with increased number and size of the numerous lung nodules in the right lung.  Persistent lymphadenopathy and right hilar and right paratracheal nodal areas, numerous hypovascular hepatic lesions slightly larger when compared to previous.   03/28/2023: CA 27-29: 84.4 04/19/2023: CA 27-29: 126   CT CAP 05/07/2023: Interval increase in size of right hepatic  metastases  2.7 cm (to be 1.6 cm) and cluster of lesions in the left hepatic lobe are similar, bone metastases stable   Plan: [facility] molecular testing: ER positive, ER positive, TMB 5, PD-L1 negative, PIK 3 CA negative  -------------------------------------------------------- Current treatment: Sacituzumab-Govitecan day 1 day 8 every 3 weeks started 05/18/2023 Toxicities: Profound pancytopenia: I removed day 8 of Trodelvy.  However will keep the appointment so that she can get 1 L of normal saline. Intractable fatigue and profound generalized weakness: We reduced the dosage further to 3 mg/kg  I will see her back with cycle 3    No orders of the defined types were placed in this encounter.  The patient has a good understanding of the overall plan. she agrees with it. she will call with any problems that may develop before the next visit here. Total time spent: 30 mins including face to face time and time spent for planning, charting and co-ordination of care   Tamsen Meek, MD 06/15/23    I have reviewed the above documentation for accuracy and completeness, and I agree with the above.

## 2023-06-15 ENCOUNTER — Other Ambulatory Visit: Payer: Self-pay

## 2023-06-15 ENCOUNTER — Inpatient Hospital Stay: Payer: Medicaid Other

## 2023-06-15 ENCOUNTER — Encounter: Payer: Self-pay | Admitting: Radiology

## 2023-06-15 ENCOUNTER — Inpatient Hospital Stay (HOSPITAL_BASED_OUTPATIENT_CLINIC_OR_DEPARTMENT_OTHER): Payer: Medicaid Other | Admitting: Hematology and Oncology

## 2023-06-15 VITALS — BP 137/90 | HR 88 | Temp 98.7°F | Resp 15 | Wt 214.1 lb

## 2023-06-15 DIAGNOSIS — C50919 Malignant neoplasm of unspecified site of unspecified female breast: Secondary | ICD-10-CM

## 2023-06-15 DIAGNOSIS — C773 Secondary and unspecified malignant neoplasm of axilla and upper limb lymph nodes: Secondary | ICD-10-CM | POA: Diagnosis not present

## 2023-06-15 DIAGNOSIS — C50911 Malignant neoplasm of unspecified site of right female breast: Secondary | ICD-10-CM

## 2023-06-15 DIAGNOSIS — C50411 Malignant neoplasm of upper-outer quadrant of right female breast: Secondary | ICD-10-CM

## 2023-06-15 DIAGNOSIS — Z170 Estrogen receptor positive status [ER+]: Secondary | ICD-10-CM | POA: Diagnosis not present

## 2023-06-15 DIAGNOSIS — Z5111 Encounter for antineoplastic chemotherapy: Secondary | ICD-10-CM | POA: Diagnosis not present

## 2023-06-15 LAB — CBC WITH DIFFERENTIAL (CANCER CENTER ONLY)
Abs Immature Granulocytes: 0.01 10*3/uL (ref 0.00–0.07)
Basophils Absolute: 0 10*3/uL (ref 0.0–0.1)
Basophils Relative: 1 %
Eosinophils Absolute: 0.1 10*3/uL (ref 0.0–0.5)
Eosinophils Relative: 2 %
HCT: 35.1 % — ABNORMAL LOW (ref 36.0–46.0)
Hemoglobin: 11.9 g/dL — ABNORMAL LOW (ref 12.0–15.0)
Immature Granulocytes: 0 %
Lymphocytes Relative: 21 %
Lymphs Abs: 0.7 10*3/uL (ref 0.7–4.0)
MCH: 35 pg — ABNORMAL HIGH (ref 26.0–34.0)
MCHC: 33.9 g/dL (ref 30.0–36.0)
MCV: 103.2 fL — ABNORMAL HIGH (ref 80.0–100.0)
Monocytes Absolute: 0.4 10*3/uL (ref 0.1–1.0)
Monocytes Relative: 12 %
Neutro Abs: 2 10*3/uL (ref 1.7–7.7)
Neutrophils Relative %: 64 %
Platelet Count: 224 10*3/uL (ref 150–400)
RBC: 3.4 MIL/uL — ABNORMAL LOW (ref 3.87–5.11)
RDW: 14.3 % (ref 11.5–15.5)
WBC Count: 3.1 10*3/uL — ABNORMAL LOW (ref 4.0–10.5)
nRBC: 0 % (ref 0.0–0.2)

## 2023-06-15 LAB — CMP (CANCER CENTER ONLY)
ALT: 16 U/L (ref 0–44)
AST: 22 U/L (ref 15–41)
Albumin: 3.3 g/dL — ABNORMAL LOW (ref 3.5–5.0)
Alkaline Phosphatase: 85 U/L (ref 38–126)
Anion gap: 6 (ref 5–15)
BUN: 14 mg/dL (ref 8–23)
CO2: 26 mmol/L (ref 22–32)
Calcium: 9.1 mg/dL (ref 8.9–10.3)
Chloride: 107 mmol/L (ref 98–111)
Creatinine: 0.8 mg/dL (ref 0.44–1.00)
GFR, Estimated: >60 mL/min (ref >60)
Glucose, Bld: 99 mg/dL (ref 70–99)
Potassium: 3.9 mmol/L (ref 3.5–5.1)
Sodium: 139 mmol/L (ref 135–145)
Total Bilirubin: 0.3 mg/dL (ref 0.3–1.2)
Total Protein: 6.5 g/dL (ref 6.5–8.1)

## 2023-06-15 LAB — PHOSPHORUS: Phosphorus: 4.8 mg/dL — ABNORMAL HIGH (ref 2.5–4.6)

## 2023-06-15 LAB — MAGNESIUM: Magnesium: 1.8 mg/dL (ref 1.7–2.4)

## 2023-06-15 MED ORDER — SODIUM CHLORIDE 0.9% FLUSH
10.0000 mL | Freq: Once | INTRAVENOUS | Status: AC
  Administered 2023-06-15: 10 mL

## 2023-06-15 MED ORDER — SODIUM CHLORIDE 0.9% FLUSH
10.0000 mL | INTRAVENOUS | Status: DC | PRN
  Administered 2023-06-15 (×2): 10 mL

## 2023-06-15 MED ORDER — PALONOSETRON HCL INJECTION 0.25 MG/5ML
0.2500 mg | Freq: Once | INTRAVENOUS | Status: AC
  Administered 2023-06-15: 0.25 mg via INTRAVENOUS
  Filled 2023-06-15: qty 5

## 2023-06-15 MED ORDER — SODIUM CHLORIDE 0.9 % IV SOLN: Freq: Once | INTRAVENOUS | Status: AC

## 2023-06-15 MED ORDER — FAMOTIDINE IN NACL 20-0.9 MG/50ML-% IV SOLN
20.0000 mg | Freq: Once | INTRAVENOUS | Status: AC
  Administered 2023-06-15: 20 mg via INTRAVENOUS
  Filled 2023-06-15: qty 50

## 2023-06-15 MED ORDER — SODIUM CHLORIDE 0.9 % IV SOLN
150.0000 mg | Freq: Once | INTRAVENOUS | Status: AC
  Administered 2023-06-15: 150 mg via INTRAVENOUS
  Filled 2023-06-15: qty 150
  Filled 2023-06-15: qty 5

## 2023-06-15 MED ORDER — SODIUM CHLORIDE 0.9 % IV SOLN
3.0000 mg/kg | Freq: Once | INTRAVENOUS | Status: AC
  Administered 2023-06-15: 290 mg via INTRAVENOUS
  Filled 2023-06-15: qty 29

## 2023-06-15 MED ORDER — HEPARIN SOD (PORK) LOCK FLUSH 100 UNIT/ML IV SOLN
500.0000 [IU] | Freq: Once | INTRAVENOUS | Status: AC | PRN
  Administered 2023-06-15: 500 [IU]

## 2023-06-15 MED ORDER — SODIUM CHLORIDE 0.9 % IV SOLN
10.0000 mg | Freq: Once | INTRAVENOUS | Status: AC
  Administered 2023-06-15: 10 mg via INTRAVENOUS
  Filled 2023-06-15: qty 1
  Filled 2023-06-15: qty 10

## 2023-06-15 MED ORDER — ACETAMINOPHEN 325 MG PO TABS
650.0000 mg | ORAL_TABLET | Freq: Once | ORAL | Status: AC
  Administered 2023-06-15: 650 mg via ORAL
  Filled 2023-06-15: qty 2

## 2023-06-15 MED ORDER — DIPHENHYDRAMINE HCL 50 MG/ML IJ SOLN
25.0000 mg | Freq: Once | INTRAMUSCULAR | Status: AC
  Administered 2023-06-15: 25 mg via INTRAVENOUS
  Filled 2023-06-15: qty 1

## 2023-06-15 NOTE — Assessment & Plan Note (Addendum)
1.  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 bone metastasis: Currently on Xgeva every 3 months.   CT CAP 02/01/2022: Mild progression of disease with increased number and size of the numerous lung nodules in the right lung.  Persistent lymphadenopathy and right hilar and right paratracheal nodal areas, numerous hypovascular hepatic lesions slightly larger when compared to previous.   03/28/2023: CA 27-29: 84.4 04/19/2023: CA 27-29: 126   CT CAP 05/07/2023: Interval increase in size of right hepatic metastases 2.7 cm (to be 1.6 cm) and cluster of lesions in the left hepatic lobe are similar, bone metastases stable   Plan: [facility] molecular testing: ER positive, ER positive, TMB 5, PD-L1 negative, PIK 3 CA negative  -------------------------------------------------------- Current treatment: Sacituzumab-Govitecan day 1 day 8 every 3 weeks started 05/18/2023 Toxicities: Profound pancytopenia: I removed day 8 of Trodelvy.  However will keep the appointment so that she can get 1 L of normal saline. Intractable fatigue and profound generalized weakness: We reduced the dosage further to 3 mg/kg  I will see her back with cycle 3

## 2023-06-15 NOTE — Progress Notes (Signed)
Pt observed for 30 minutes post Trodelvy infusion. Pt tolerated trtmt well w/out incident. VSS at discharge.  Ambulatory to lobby.

## 2023-06-15 NOTE — Patient Instructions (Signed)
[facility]  Discharge Instructions: Thank you for choosing [facility] to provide your oncology and hematology care.   If you have a lab appointment with the Cancer Center, please go directly to the Cancer Center and check in at the registration area.   Wear comfortable clothing and clothing appropriate for easy access to any Portacath or PICC line.   We strive to give you quality time with your provider. You may need to reschedule your appointment if you arrive late (15 or more minutes).  Arriving late affects you and other patients whose appointments are after yours.  Also, if you miss three or more appointments without notifying the office, you may be dismissed from the clinic at the provider's discretion.      For prescription refill requests, have your pharmacy contact our office and allow 72 hours for refills to be completed.    Today you received the following chemotherapy and/or immunotherapy agents: Trodelvy      To help prevent nausea and vomiting after your treatment, we encourage you to take your nausea medication as directed.  BELOW ARE SYMPTOMS THAT SHOULD BE REPORTED IMMEDIATELY: *FEVER GREATER THAN 100.4 F (38 C) OR HIGHER *CHILLS OR SWEATING *NAUSEA AND VOMITING THAT IS NOT CONTROLLED WITH YOUR NAUSEA MEDICATION *UNUSUAL SHORTNESS OF BREATH *UNUSUAL BRUISING OR BLEEDING *URINARY PROBLEMS (pain or burning when urinating, or frequent urination) *BOWEL PROBLEMS (unusual diarrhea, constipation, pain near the anus) TENDERNESS IN MOUTH AND THROAT WITH OR WITHOUT PRESENCE OF ULCERS (sore throat, sores in mouth, or a toothache) UNUSUAL RASH, SWELLING OR PAIN  UNUSUAL VAGINAL DISCHARGE OR ITCHING   Items with * indicate a potential emergency and should be followed up as soon as possible or go to the Emergency Department if any problems should occur.  Please show the CHEMOTHERAPY ALERT CARD or IMMUNOTHERAPY ALERT CARD at  check-in to the Emergency Department and triage nurse.  Should you have questions after your visit or need to cancel or reschedule your appointment, please contact [facility]  Dept: [phone]  and follow the prompts.  Office hours are 8:00 a.m. to 4:30 p.m. Monday - Friday. Please note that voicemails left after 4:00 p.m. may not be returned until the following business day.  We are closed weekends and major holidays. You have access to a nurse at all times for urgent questions. Please call the main number to the clinic Dept: [phone] and follow the prompts.   For any non-urgent questions, you may also contact your provider using MyChart. We now offer e-Visits for anyone 18 and older to request care online for non-urgent symptoms. For details visit [email or web address].   Also download the MyChart app! Go to the app store, search "MyChart", open the app, select [facility], and log in with your MyChart username and password.   

## 2023-06-15 NOTE — Research (Signed)
[name]: PALBOCICLIB COLLABORATIVE ADJUVANT STUDY: A RANDOMIZED PHASE III TRIAL OF PALBOCICLIB WITH STANDARD ADJUVANT ENDOCRINE THERAPY VERSUS STANDARD ADJUVANT ENDOCRINE THERAPY ALONE FOR HORMONE RECEPTOR POSITIVE (HR+)/HUMAN EPIDERMAL GROWTH FACTOR RECEPTOR 2 (HER2)-NEGATIVE EARLY BREAST CANCER   06/15/23  73 MONTH VISIT: Met with April [name] unaccompanied during her infusion. She states the past couple of weeks have been difficult and the last treatment caused multiple side effects.There are no new diagnoses or oncologic events. Patient saw provider today and continues treatment. This coordinator informed patient next visit will be in approximately 1 yr. Patient expressed understanding and was thanked for her time and continued support of the above mentioned study.   Merri Brunette, RT(R)(T) Clinical Research Coordinator

## 2023-06-16 LAB — CANCER ANTIGEN 27.29: CA 27.29: 155.9 U/mL — ABNORMAL HIGH (ref 0.0–38.6)

## 2023-06-20 ENCOUNTER — Other Ambulatory Visit: Payer: Self-pay

## 2023-06-20 DIAGNOSIS — C50919 Malignant neoplasm of unspecified site of unspecified female breast: Secondary | ICD-10-CM

## 2023-06-21 MED FILL — Fosaprepitant Dimeglumine For IV Infusion 150 MG (Base Eq): INTRAVENOUS | Qty: 5 | Status: AC

## 2023-06-21 MED FILL — Dexamethasone Sodium Phosphate Inj 100 MG/10ML: INTRAMUSCULAR | Qty: 1 | Status: AC

## 2023-06-22 ENCOUNTER — Other Ambulatory Visit: Payer: Self-pay | Admitting: Hematology and Oncology

## 2023-06-22 ENCOUNTER — Inpatient Hospital Stay: Payer: Medicaid Other

## 2023-06-22 ENCOUNTER — Other Ambulatory Visit: Payer: Self-pay

## 2023-06-22 DIAGNOSIS — C50411 Malignant neoplasm of upper-outer quadrant of right female breast: Secondary | ICD-10-CM

## 2023-06-22 DIAGNOSIS — Z5111 Encounter for antineoplastic chemotherapy: Secondary | ICD-10-CM | POA: Diagnosis not present

## 2023-06-22 DIAGNOSIS — C50919 Malignant neoplasm of unspecified site of unspecified female breast: Secondary | ICD-10-CM

## 2023-06-22 DIAGNOSIS — C50911 Malignant neoplasm of unspecified site of right female breast: Secondary | ICD-10-CM

## 2023-06-22 LAB — CMP (CANCER CENTER ONLY)
ALT: 18 U/L (ref 0–44)
AST: 25 U/L (ref 15–41)
Albumin: 3.6 g/dL (ref 3.5–5.0)
Alkaline Phosphatase: 86 U/L (ref 38–126)
Anion gap: 5 (ref 5–15)
BUN: 14 mg/dL (ref 8–23)
CO2: 26 mmol/L (ref 22–32)
Calcium: 9 mg/dL (ref 8.9–10.3)
Chloride: 106 mmol/L (ref 98–111)
Creatinine: 0.73 mg/dL (ref 0.44–1.00)
GFR, Estimated: >60 mL/min (ref >60)
Glucose, Bld: 83 mg/dL (ref 70–99)
Potassium: 3.8 mmol/L (ref 3.5–5.1)
Sodium: 137 mmol/L (ref 135–145)
Total Bilirubin: 0.3 mg/dL (ref 0.3–1.2)
Total Protein: 6.8 g/dL (ref 6.5–8.1)

## 2023-06-22 LAB — CBC WITH DIFFERENTIAL (CANCER CENTER ONLY)
Abs Immature Granulocytes: 0.02 10*3/uL (ref 0.00–0.07)
Basophils Absolute: 0 10*3/uL (ref 0.0–0.1)
Basophils Relative: 1 %
Eosinophils Absolute: 0.1 10*3/uL (ref 0.0–0.5)
Eosinophils Relative: 2 %
HCT: 37.8 % (ref 36.0–46.0)
Hemoglobin: 12.8 g/dL (ref 12.0–15.0)
Immature Granulocytes: 1 %
Lymphocytes Relative: 19 %
Lymphs Abs: 0.7 10*3/uL (ref 0.7–4.0)
MCH: 34.5 pg — ABNORMAL HIGH (ref 26.0–34.0)
MCHC: 33.9 g/dL (ref 30.0–36.0)
MCV: 101.9 fL — ABNORMAL HIGH (ref 80.0–100.0)
Monocytes Absolute: 0.5 10*3/uL (ref 0.1–1.0)
Monocytes Relative: 15 %
Neutro Abs: 2.3 10*3/uL (ref 1.7–7.7)
Neutrophils Relative %: 62 %
Platelet Count: 252 10*3/uL (ref 150–400)
RBC: 3.71 MIL/uL — ABNORMAL LOW (ref 3.87–5.11)
RDW: 14.6 % (ref 11.5–15.5)
WBC Count: 3.7 10*3/uL — ABNORMAL LOW (ref 4.0–10.5)
nRBC: 0 % (ref 0.0–0.2)

## 2023-06-22 LAB — MAGNESIUM: Magnesium: 2.1 mg/dL (ref 1.7–2.4)

## 2023-06-22 LAB — PHOSPHORUS: Phosphorus: 3.5 mg/dL (ref 2.5–4.6)

## 2023-06-22 MED ORDER — SODIUM CHLORIDE 0.9% FLUSH
10.0000 mL | INTRAVENOUS | Status: DC | PRN
  Administered 2023-06-22: 10 mL

## 2023-06-22 MED ORDER — HEPARIN SOD (PORK) LOCK FLUSH 100 UNIT/ML IV SOLN
500.0000 [IU] | Freq: Once | INTRAVENOUS | Status: AC | PRN
  Administered 2023-06-22: 500 [IU]

## 2023-06-22 MED ORDER — SODIUM CHLORIDE 0.9% FLUSH
10.0000 mL | Freq: Once | INTRAVENOUS | Status: AC
  Administered 2023-06-22: 10 mL

## 2023-06-22 MED ORDER — SODIUM CHLORIDE 0.9 % IV SOLN: Freq: Once | INTRAVENOUS | Status: AC

## 2023-06-22 NOTE — Progress Notes (Signed)
Per MD, pt to receive 1L NS only today, no TX.

## 2023-06-22 NOTE — Patient Instructions (Signed)

## 2023-06-23 LAB — CANCER ANTIGEN 27.29: CA 27.29: 204.3 U/mL — ABNORMAL HIGH (ref 0.0–38.6)

## 2023-06-29 ENCOUNTER — Ambulatory Visit: Payer: Medicaid Other

## 2023-06-29 ENCOUNTER — Ambulatory Visit: Payer: Medicaid Other | Admitting: Hematology and Oncology

## 2023-06-29 ENCOUNTER — Other Ambulatory Visit: Payer: Medicaid Other

## 2023-07-01 NOTE — Progress Notes (Signed)
Patient Care Team: Julieanne Manson, MD as PCP - General (Internal Medicine) Maisie Fus, MD as PCP - Cardiology (Cardiology) Lonie Peak, MD as Attending Physician (Radiation Oncology) Axel Filler, Larna Daughters, NP as Nurse Practitioner (Hematology and Oncology) Berna Bue, MD as Consulting Physician (General Surgery) Felecia Shelling, DPM as Consulting Physician (Podiatry) Graylin Shiver, MD as Consulting Physician (Gastroenterology) Serena Croissant, MD as Attending Physician (Hematology and Oncology)  DIAGNOSIS:  Encounter Diagnosis  Name Primary?   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)   06/21/2022 - 08/03/2022 Chemotherapy   Patient is on Treatment Plan : BREAST METASTATIC fam-trastuzumab deruxtecan-nxki (Enhertu) q21d     06/21/2022 - 04/19/2023 Chemotherapy   Patient is on Treatment Plan : BREAST METASTATIC Fam-Trastuzumab Deruxtecan-nxki (Enhertu) (5.4) q21d     05/18/2023 -  Chemotherapy   Patient is on Treatment Plan : BREAST METASTATIC Sacituzumab govitecan-hziy [name]) D1,8 q21d     Malignant neoplasm of upper-outer quadrant of right breast in female, estrogen receptor positive (HCC)  06/21/2016 Initial Diagnosis   Malignant neoplasm of upper-outer quadrant of right breast in female, estrogen receptor positive (HCC)   06/21/2016 Initial Biopsy   Right breast biopsy, 10 oclock: IDC, grade 3, ER+(95%), PR+(80%),Ki67 20%, HER-2 negative (ratio 0.67). Right axilla core biopsy: carcinoma, grade 3, ER+(100%), PR+(90%), Ki67 25%, HER-2 negative (ratio 1.13).    07/17/2016 - 11/06/2016 Neo-Adjuvant Chemotherapy   Received 2 cycles of Doxorubicin and Cyclophosphamide, then transitioned to weekly Paclitaxel  (due to repeated febrile neutropenia) x 7 cycles, stopped early due to peripheral neuropathy, then completed her final 2 cycles of Doxorubicin and Cyclophosphamide.    12/19/2016 Surgery   Right breast lumpectomy ([facility]): IDC, grade 2, 1.6cm and 0.3cm, margins negative, 3 SLN positive for metastatic carcinoma.     12/26/2016 Surgery   ALND: metastatic carcinoma in one of 20 lymph nodes, and three nodes from previous lumpectomy.  Four positive nodes, consistent with pN2a.   02/07/2017 - 03/21/2017 Radiation Therapy   Adjuvant radiation Basilio [place]): 1) Right breast and nodes - 4 field: 50 Gy in 25 fractions. IM NODES: >95% receive at least 45Gy/54fx. 50Gy to SCLV/PAB @ 2Gy /fraction x 25 fractions. 2) Right breast boost: 10 Gy in 5 fractions   04/2017 -  Anti-estrogen oral therapy   Anastrozole 1mg  daily.  Bone density 03/23/2017 finds T score of -2.6, osteoporosis, plan to start Prolia following dental clearance Anastrozole stopped 01/16/18 Exemestane 25 mg daily 02/12/18  On [name], trial randomized to endocrine therapy alone   05/27/2020 Progression   chest CT scan 05/27/2020 shows bulky mediastinal and right hilar lymphadenopathy with right pleural nodules and a small right pleural effusion, no evidence of liver or bone involvement             (a) biopsy of right breast mass 06/02/2020 shows invasive ductal carcinoma, estrogen and progesterone receptor positive, HER-2 not amplified, with an MIB-1 of 40%   06/17/2020 Treatment Plan Change    fulvestrant to start 06/17/2020             (a) palbociclib to start 06/17/2020 at 125 mg daily 21 days on 7 days off             (b) palbociclib decreased to 125mg  every other  day x 11 doses on 07/23/2020             (c) palbociclib dose decreased to100 mg daily, 21/7, starting with September cycle             (d) Palbociclib decreased to 75mg  daily beginning with March cycle due to oral ulcers             (e) palbociclib discontinued November 2022 with  progression   09/14/2021 PET scan   PET scan 09/14/2021 shows progression in liver and bone   10/05/2021 Pathology Results   liver biopsy 10/05/2021 confirms metastatic carcinoma, estrogen receptor strongly positive, progesterone receptor and HER2 negative, with an MIB-1 of 30%.   10/2021 Treatment Plan Change   fulvestrant continued, Xgeva added, palbociclib changed to abemaciclib November 2022 -Dose Decreased to 50mg  PO BID Daily   03/07/2022 Miscellaneous   Caris molecular testing: ER positive, ER positive, MTAP: Detected, ESR 1 negative, BRCA 1 and 2 negative, MSI stable, PD-L1 negative, PIK 3 CA negative, PR negative,   06/21/2022 - 08/03/2022 Chemotherapy   Patient is on Treatment Plan : BREAST METASTATIC fam-trastuzumab deruxtecan-nxki (Enhertu) q21d     06/21/2022 - 04/19/2023 Chemotherapy   Patient is on Treatment Plan : BREAST METASTATIC Fam-Trastuzumab Deruxtecan-nxki (Enhertu) (5.4) q21d     01/25/2023 Cancer Staging   Staging form: Breast, AJCC 7th Edition - Pathologic: Stage IV (M1) - Signed by Loa Socks, NP on 01/25/2023   05/18/2023 -  Chemotherapy   Patient is on Treatment Plan : BREAST METASTATIC Sacituzumab govitecan-hziy [name]) D1,8 q21d     Breast cancer, stage 4 (HCC)  03/09/2022 Initial Diagnosis   Breast cancer, stage 4 (HCC)   06/21/2022 - 08/03/2022 Chemotherapy   Patient is on Treatment Plan : BREAST METASTATIC fam-trastuzumab deruxtecan-nxki (Enhertu) q21d     06/21/2022 - 04/19/2023 Chemotherapy   Patient is on Treatment Plan : BREAST METASTATIC Fam-Trastuzumab Deruxtecan-nxki (Enhertu) (5.4) q21d     05/18/2023 -  Chemotherapy   Patient is on Treatment Plan : BREAST METASTATIC Sacituzumab govitecan-hziy [name]) D1,8 q21d       CHIEF COMPLIANT: Constance Goltz  INTERVAL HISTORY: April [name] is a 62 year old with above-mentioned metastatic breast cancer is currently on Enhertu treatment. She presents to the clinic for a follow-up. She reports  no new symptoms or pain to the clinic today. Denies double vision and headaches. No numbness to the face. Energy level is still about the same. She has times where she doesn't feel great. Mild diarrhea. She had it all day yesterday. She has muscle cramps in fingers and toe.    ALLERGIES:  is allergic to codeine, cymbalta [duloxetine hcl], hydrocodone, ultram [tramadol hcl], venlafaxine, and gabapentin.  MEDICATIONS:  Current Outpatient Medications  Medication Sig Dispense Refill   carvedilol (COREG) 3.125 MG tablet Take 1 tablet (3.125 mg total) by mouth 2 (two) times daily. 180 tablet 3   Denosumab (XGEVA [place]) Inject 120 mg into the skin every 3 (three) months.     loratadine (CLARITIN) 10 MG tablet Take 10 mg by mouth daily.     naproxen sodium (ALEVE) 220 MG tablet Take 440-660 mg by mouth 2 (two) times daily as needed (pain).     omeprazole (PRILOSEC) 20 MG capsule Take 20 mg by mouth daily.     ondansetron (ZOFRAN) 8 MG tablet as needed for nausea or vomiting.     rizatriptan (MAXALT) 5 MG tablet Take 1 tablet (5 mg total) by mouth as needed for migraine.  May repeat in 2 hours if needed 10 tablet 0   simethicone (MYLICON) 125 MG chewable tablet Chew 125 mg by mouth every 6 (six) hours as needed for flatulence.     valACYclovir (VALTREX) 1000 MG tablet Take 1 tablet (1,000 mg total) by mouth 2 (two) times daily. 1/2 tab BID (Patient taking differently: Take 500 mg by mouth See admin instructions. Take 500 mg daily, may take a second 500 mg dose as needed for outbreaks) 180 tablet 4   dicyclomine (BENTYL) 20 MG tablet Take 1 tablet (20 mg total) by mouth 2 (two) times daily. 20 tablet 0   fam-trastuzumab deruxtecan-nxki (ENHERTU) 100 MG SOLR Inject 100 mg into the vein every 21 ( twenty-one) days.     potassium chloride SA (KLOR-CON M) 20 MEQ tablet Take 1 tablet (20 mEq total) by mouth daily for 4 days. 4 tablet 0   No current facility-administered medications for this visit.    PHYSICAL  EXAMINATION: ECOG PERFORMANCE STATUS: 1 - Symptomatic but completely ambulatory  Vitals:   07/06/23 0946  BP: 134/81  Pulse: 87  Resp: 18  Temp: (!) 97.3 F (36.3 C)  SpO2: 97%   Filed Weights   07/06/23 0946  Weight: 213 lb 8 oz (96.8 kg)      LABORATORY DATA:  I have reviewed the data as listed    Latest Ref Rng & Units 07/06/2023    9:29 AM 06/22/2023    9:32 AM 06/15/2023    8:29 AM  CMP  Glucose 70 - 99 mg/dL 87  83  99   BUN 8 - 23 mg/dL 9  14  14    Creatinine 0.44 - 1.00 mg/dL 4.09  8.11  9.14   Sodium 135 - 145 mmol/L 141  137  139   Potassium 3.5 - 5.1 mmol/L 3.1  3.8  3.9   Chloride 98 - 111 mmol/L 111  106  107   CO2 22 - 32 mmol/L 25  26  26    Calcium 8.9 - 10.3 mg/dL 8.5  9.0  9.1   Total Protein 6.5 - 8.1 g/dL 6.3  6.8  6.5   Total Bilirubin 0.3 - 1.2 mg/dL 0.4  0.3  0.3   Alkaline Phos 38 - 126 U/L 82  86  85   AST 15 - 41 U/L 23  25  22    ALT 0 - 44 U/L 14  18  16      Lab Results  Component Value Date   WBC 3.2 (L) 07/06/2023   HGB 12.4 07/06/2023   HCT 35.5 (L) 07/06/2023   MCV 98.9 07/06/2023   PLT 166 07/06/2023   NEUTROABS 1.9 07/06/2023    ASSESSMENT & PLAN:  Malignant neoplasm of upper-outer quadrant of right breast in female, estrogen receptor positive (HCC) 1.  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 bone metastasis: Currently on Xgeva every 3 months.   CT CAP 02/01/2022: Mild progression of disease with increased number and size of the numerous lung nodules in the right lung.  Persistent lymphadenopathy and right hilar and right paratracheal nodal areas, numerous hypovascular hepatic lesions slightly larger when compared to previous.   03/28/2023: CA 27-29: 84.4 04/19/2023: CA 27-29: 126 06/22/2023: CA 27-29: 204   CT CAP 05/07/2023: Interval increase in size of right hepatic metastases 2.7 cm (to be 1.6 cm) and cluster of lesions in the left hepatic lobe are similar, bone  metastases  stable   Plan: Caris molecular testing: ER positive, ER positive, TMB 5, PD-L1 negative, PIK 3 CA negative  -------------------------------------------------------- Current treatment: Sacituzumab-Govitecan day 1 day 8 every 3 weeks started 05/18/2023, today is cycle 3 Toxicities: Profound pancytopenia: removed day 8 of Trodelvy.   Intractable fatigue and profound generalized weakness: We reduced the dosage further to 3 mg/kg Diarrhea: Related to hypokalemia: Instructed to take Imodium.  I am concerned about rising CA 27-29 levels.  We would like to get scans after this treatment to assess response to treatment. I will call her with the results of the scans. Return to clinic in 3 weeks for cycle 4    Orders Placed This Encounter  Procedures   CT CHEST ABDOMEN PELVIS W CONTRAST    Standing Status:   Future    Standing Expiration Date:   07/05/2024    Order Specific Question:   If indicated for the ordered procedure, I authorize the administration of contrast media per Radiology protocol    Answer:   Yes    Order Specific Question:   Does the patient have a contrast media/X-ray dye allergy?    Answer:   No    Order Specific Question:   Preferred imaging location?    Answer:   [facility]    Order Specific Question:   If indicated for the ordered procedure, I authorize the administration of oral contrast media per Radiology protocol    Answer:   Yes   The patient has a good understanding of the overall plan. she agrees with it. she will call with any problems that may develop before the next visit here. Total time spent: 30 mins including face to face time and time spent for planning, charting and co-ordination of care   Tamsen Meek, MD 07/06/23    I Janan Ridge am acting as a Neurosurgeon for [employer]  I have reviewed the above documentation for accuracy and completeness, and I agree with the above.

## 2023-07-05 MED FILL — Fosaprepitant Dimeglumine For IV Infusion 150 MG (Base Eq): INTRAVENOUS | Qty: 5 | Status: AC

## 2023-07-05 MED FILL — Dexamethasone Sodium Phosphate Inj 100 MG/10ML: INTRAMUSCULAR | Qty: 1 | Status: AC

## 2023-07-06 ENCOUNTER — Inpatient Hospital Stay: Payer: Medicaid Other

## 2023-07-06 ENCOUNTER — Other Ambulatory Visit: Payer: Self-pay

## 2023-07-06 ENCOUNTER — Inpatient Hospital Stay: Payer: Medicaid Other | Attending: Oncology

## 2023-07-06 ENCOUNTER — Inpatient Hospital Stay (HOSPITAL_BASED_OUTPATIENT_CLINIC_OR_DEPARTMENT_OTHER): Payer: Medicaid Other | Admitting: Hematology and Oncology

## 2023-07-06 VITALS — BP 115/84 | HR 72 | Temp 97.8°F | Resp 22

## 2023-07-06 VITALS — BP 134/81 | HR 87 | Temp 97.3°F | Resp 18 | Ht 64.0 in | Wt 213.5 lb

## 2023-07-06 DIAGNOSIS — E876 Hypokalemia: Secondary | ICD-10-CM | POA: Diagnosis not present

## 2023-07-06 DIAGNOSIS — C773 Secondary and unspecified malignant neoplasm of axilla and upper limb lymph nodes: Secondary | ICD-10-CM | POA: Diagnosis not present

## 2023-07-06 DIAGNOSIS — C7951 Secondary malignant neoplasm of bone: Secondary | ICD-10-CM | POA: Insufficient documentation

## 2023-07-06 DIAGNOSIS — Z79899 Other long term (current) drug therapy: Secondary | ICD-10-CM | POA: Diagnosis not present

## 2023-07-06 DIAGNOSIS — C50411 Malignant neoplasm of upper-outer quadrant of right female breast: Secondary | ICD-10-CM | POA: Diagnosis not present

## 2023-07-06 DIAGNOSIS — R5383 Other fatigue: Secondary | ICD-10-CM | POA: Diagnosis not present

## 2023-07-06 DIAGNOSIS — Z5111 Encounter for antineoplastic chemotherapy: Secondary | ICD-10-CM | POA: Insufficient documentation

## 2023-07-06 DIAGNOSIS — C50911 Malignant neoplasm of unspecified site of right female breast: Secondary | ICD-10-CM

## 2023-07-06 DIAGNOSIS — D61818 Other pancytopenia: Secondary | ICD-10-CM | POA: Insufficient documentation

## 2023-07-06 DIAGNOSIS — C50919 Malignant neoplasm of unspecified site of unspecified female breast: Secondary | ICD-10-CM

## 2023-07-06 DIAGNOSIS — Z79811 Long term (current) use of aromatase inhibitors: Secondary | ICD-10-CM | POA: Diagnosis not present

## 2023-07-06 DIAGNOSIS — R197 Diarrhea, unspecified: Secondary | ICD-10-CM | POA: Insufficient documentation

## 2023-07-06 DIAGNOSIS — C787 Secondary malignant neoplasm of liver and intrahepatic bile duct: Secondary | ICD-10-CM | POA: Insufficient documentation

## 2023-07-06 DIAGNOSIS — Z79624 Long term (current) use of inhibitors of nucleotide synthesis: Secondary | ICD-10-CM | POA: Diagnosis not present

## 2023-07-06 DIAGNOSIS — Z170 Estrogen receptor positive status [ER+]: Secondary | ICD-10-CM | POA: Insufficient documentation

## 2023-07-06 LAB — CMP (CANCER CENTER ONLY)
ALT: 14 U/L (ref 0–44)
AST: 23 U/L (ref 15–41)
Albumin: 3.5 g/dL (ref 3.5–5.0)
Alkaline Phosphatase: 82 U/L (ref 38–126)
Anion gap: 5 (ref 5–15)
BUN: 9 mg/dL (ref 8–23)
CO2: 25 mmol/L (ref 22–32)
Calcium: 8.5 mg/dL — ABNORMAL LOW (ref 8.9–10.3)
Chloride: 111 mmol/L (ref 98–111)
Creatinine: 0.84 mg/dL (ref 0.44–1.00)
GFR, Estimated: >60 mL/min (ref >60)
Glucose, Bld: 87 mg/dL (ref 70–99)
Potassium: 3.1 mmol/L — ABNORMAL LOW (ref 3.5–5.1)
Sodium: 141 mmol/L (ref 135–145)
Total Bilirubin: 0.4 mg/dL (ref 0.3–1.2)
Total Protein: 6.3 g/dL — ABNORMAL LOW (ref 6.5–8.1)

## 2023-07-06 LAB — CBC WITH DIFFERENTIAL (CANCER CENTER ONLY)
Abs Immature Granulocytes: 0 10*3/uL (ref 0.00–0.07)
Basophils Absolute: 0 10*3/uL (ref 0.0–0.1)
Basophils Relative: 1 %
Eosinophils Absolute: 0.2 10*3/uL (ref 0.0–0.5)
Eosinophils Relative: 7 %
HCT: 35.5 % — ABNORMAL LOW (ref 36.0–46.0)
Hemoglobin: 12.4 g/dL (ref 12.0–15.0)
Immature Granulocytes: 0 %
Lymphocytes Relative: 22 %
Lymphs Abs: 0.7 10*3/uL (ref 0.7–4.0)
MCH: 34.5 pg — ABNORMAL HIGH (ref 26.0–34.0)
MCHC: 34.9 g/dL (ref 30.0–36.0)
MCV: 98.9 fL (ref 80.0–100.0)
Monocytes Absolute: 0.4 10*3/uL (ref 0.1–1.0)
Monocytes Relative: 11 %
Neutro Abs: 1.9 10*3/uL (ref 1.7–7.7)
Neutrophils Relative %: 59 %
Platelet Count: 166 10*3/uL (ref 150–400)
RBC: 3.59 MIL/uL — ABNORMAL LOW (ref 3.87–5.11)
RDW: 14 % (ref 11.5–15.5)
WBC Count: 3.2 10*3/uL — ABNORMAL LOW (ref 4.0–10.5)
nRBC: 0 % (ref 0.0–0.2)

## 2023-07-06 LAB — MAGNESIUM: Magnesium: 2 mg/dL (ref 1.7–2.4)

## 2023-07-06 LAB — PHOSPHORUS: Phosphorus: 2.8 mg/dL (ref 2.5–4.6)

## 2023-07-06 MED ORDER — PALONOSETRON HCL INJECTION 0.25 MG/5ML
0.2500 mg | Freq: Once | INTRAVENOUS | Status: AC
  Administered 2023-07-06: 0.25 mg via INTRAVENOUS
  Filled 2023-07-06: qty 5

## 2023-07-06 MED ORDER — HEPARIN SOD (PORK) LOCK FLUSH 100 UNIT/ML IV SOLN
500.0000 [IU] | Freq: Once | INTRAVENOUS | Status: AC | PRN
  Administered 2023-07-06: 500 [IU]

## 2023-07-06 MED ORDER — SODIUM CHLORIDE 0.9% FLUSH
10.0000 mL | Freq: Once | INTRAVENOUS | Status: AC
  Administered 2023-07-06: 10 mL

## 2023-07-06 MED ORDER — SODIUM CHLORIDE 0.9 % IV SOLN: Freq: Once | INTRAVENOUS | Status: AC

## 2023-07-06 MED ORDER — DIPHENHYDRAMINE HCL 50 MG/ML IJ SOLN
25.0000 mg | Freq: Once | INTRAMUSCULAR | Status: AC
  Administered 2023-07-06: 25 mg via INTRAVENOUS
  Filled 2023-07-06: qty 1

## 2023-07-06 MED ORDER — SODIUM CHLORIDE 0.9 % IV SOLN
3.0000 mg/kg | Freq: Once | INTRAVENOUS | Status: AC
  Administered 2023-07-06: 290 mg via INTRAVENOUS
  Filled 2023-07-06: qty 29

## 2023-07-06 MED ORDER — SODIUM CHLORIDE 0.9 % IV SOLN
150.0000 mg | Freq: Once | INTRAVENOUS | Status: AC
  Administered 2023-07-06: 150 mg via INTRAVENOUS
  Filled 2023-07-06: qty 150

## 2023-07-06 MED ORDER — ACETAMINOPHEN 325 MG PO TABS
650.0000 mg | ORAL_TABLET | Freq: Once | ORAL | Status: AC
  Administered 2023-07-06: 650 mg via ORAL
  Filled 2023-07-06: qty 2

## 2023-07-06 MED ORDER — SODIUM CHLORIDE 0.9 % IV SOLN
10.0000 mg | Freq: Once | INTRAVENOUS | Status: AC
  Administered 2023-07-06: 10 mg via INTRAVENOUS
  Filled 2023-07-06: qty 10

## 2023-07-06 MED ORDER — SODIUM CHLORIDE 0.9% FLUSH
10.0000 mL | INTRAVENOUS | Status: DC | PRN
  Administered 2023-07-06: 10 mL

## 2023-07-06 MED ORDER — FAMOTIDINE IN NACL 20-0.9 MG/50ML-% IV SOLN
20.0000 mg | Freq: Once | INTRAVENOUS | Status: AC
  Administered 2023-07-06: 20 mg via INTRAVENOUS
  Filled 2023-07-06: qty 50

## 2023-07-06 NOTE — Patient Instructions (Signed)
[facility]  Discharge Instructions: Thank you for choosing [facility] to provide your oncology and hematology care.   If you have a lab appointment with the Cancer Center, please go directly to the Cancer Center and check in at the registration area.   Wear comfortable clothing and clothing appropriate for easy access to any Portacath or PICC line.   We strive to give you quality time with your provider. You may need to reschedule your appointment if you arrive late (15 or more minutes).  Arriving late affects you and other patients whose appointments are after yours.  Also, if you miss three or more appointments without notifying the office, you may be dismissed from the clinic at the provider's discretion.      For prescription refill requests, have your pharmacy contact our office and allow 72 hours for refills to be completed.    Today you received the following chemotherapy and/or immunotherapy agents: Trodelvy      To help prevent nausea and vomiting after your treatment, we encourage you to take your nausea medication as directed.  BELOW ARE SYMPTOMS THAT SHOULD BE REPORTED IMMEDIATELY: *FEVER GREATER THAN 100.4 F (38 C) OR HIGHER *CHILLS OR SWEATING *NAUSEA AND VOMITING THAT IS NOT CONTROLLED WITH YOUR NAUSEA MEDICATION *UNUSUAL SHORTNESS OF BREATH *UNUSUAL BRUISING OR BLEEDING *URINARY PROBLEMS (pain or burning when urinating, or frequent urination) *BOWEL PROBLEMS (unusual diarrhea, constipation, pain near the anus) TENDERNESS IN MOUTH AND THROAT WITH OR WITHOUT PRESENCE OF ULCERS (sore throat, sores in mouth, or a toothache) UNUSUAL RASH, SWELLING OR PAIN  UNUSUAL VAGINAL DISCHARGE OR ITCHING   Items with * indicate a potential emergency and should be followed up as soon as possible or go to the Emergency Department if any problems should occur.  Please show the CHEMOTHERAPY ALERT CARD or IMMUNOTHERAPY ALERT CARD at  check-in to the Emergency Department and triage nurse.  Should you have questions after your visit or need to cancel or reschedule your appointment, please contact [facility]  Dept: [phone]  and follow the prompts.  Office hours are 8:00 a.m. to 4:30 p.m. Monday - Friday. Please note that voicemails left after 4:00 p.m. may not be returned until the following business day.  We are closed weekends and major holidays. You have access to a nurse at all times for urgent questions. Please call the main number to the clinic Dept: [phone] and follow the prompts.   For any non-urgent questions, you may also contact your provider using MyChart. We now offer e-Visits for anyone 18 and older to request care online for non-urgent symptoms. For details visit [email or web address].   Also download the MyChart app! Go to the app store, search "MyChart", open the app, select [facility], and log in with your MyChart username and password. 

## 2023-07-06 NOTE — Assessment & Plan Note (Signed)
1.  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 bone metastasis: Currently on Xgeva every 3 months.   CT CAP 02/01/2022: Mild progression of disease with increased number and size of the numerous lung nodules in the right lung.  Persistent lymphadenopathy and right hilar and right paratracheal nodal areas, numerous hypovascular hepatic lesions slightly larger when compared to previous.   03/28/2023: CA 27-29: 84.4 04/19/2023: CA 27-29: 126 06/22/2023: CA 27-29: 204   CT CAP 05/07/2023: Interval increase in size of right hepatic metastases 2.7 cm (to be 1.6 cm) and cluster of lesions in the left hepatic lobe are similar, bone metastases stable   Plan: Caris molecular testing: ER positive, ER positive, TMB 5, PD-L1 negative, PIK 3 CA negative  -------------------------------------------------------- Current treatment: Sacituzumab-Govitecan day 1 day 8 every 3 weeks started 05/18/2023, today is cycle 3 Toxicities: Profound pancytopenia: removed day 8 of Trodelvy.   Intractable fatigue and profound generalized weakness: We reduced the dosage further to 3 mg/kg  I am concerned about rising CA 27-29 levels.  We would like to get scans after this treatment to assess response to treatment. Return to clinic in 3 weeks for cycle 4

## 2023-07-06 NOTE — Progress Notes (Signed)
OK to treat with K 3.1 and pending phosphorus levels per Dr. Pamelia Hoit.  Pt declined 30 min post infusion observation.  VSS at discharge.  Tolerated treatment well without incident.  Ambulated to lobby.

## 2023-07-13 ENCOUNTER — Inpatient Hospital Stay: Payer: Medicaid Other

## 2023-07-13 ENCOUNTER — Inpatient Hospital Stay: Payer: Medicaid Other | Admitting: Hematology and Oncology

## 2023-07-16 ENCOUNTER — Telehealth: Payer: Self-pay | Admitting: Hematology and Oncology

## 2023-07-16 ENCOUNTER — Other Ambulatory Visit: Payer: Self-pay

## 2023-07-16 NOTE — Telephone Encounter (Signed)
Scheduled appointment per los. Patient is aware of the made appointment. 

## 2023-07-18 ENCOUNTER — Ambulatory Visit (HOSPITAL_COMMUNITY)
Admission: RE | Admit: 2023-07-18 | Discharge: 2023-07-18 | Disposition: A | Discharge status: Home / Self Care | Payer: Medicaid Other | Source: Ambulatory Visit | Attending: Hematology and Oncology | Admitting: Hematology and Oncology

## 2023-07-18 DIAGNOSIS — C50411 Malignant neoplasm of upper-outer quadrant of right female breast: Secondary | ICD-10-CM | POA: Diagnosis present

## 2023-07-18 DIAGNOSIS — Z170 Estrogen receptor positive status [ER+]: Secondary | ICD-10-CM | POA: Insufficient documentation

## 2023-07-18 DIAGNOSIS — C787 Secondary malignant neoplasm of liver and intrahepatic bile duct: Secondary | ICD-10-CM | POA: Diagnosis not present

## 2023-07-18 MED ORDER — IOHEXOL 300 MG/ML  SOLN
100.0000 mL | Freq: Once | INTRAMUSCULAR | Status: AC | PRN
  Administered 2023-07-18: 100 mL via INTRAVENOUS

## 2023-07-18 MED ORDER — SODIUM CHLORIDE (PF) 0.9 % IJ SOLN
INTRAMUSCULAR | Status: AC
  Filled 2023-07-18: qty 50

## 2023-07-18 MED ORDER — HEPARIN SOD (PORK) LOCK FLUSH 100 UNIT/ML IV SOLN
500.0000 [IU] | Freq: Once | INTRAVENOUS | Status: AC
  Administered 2023-07-18: 500 [IU] via INTRAVENOUS

## 2023-07-20 ENCOUNTER — Ambulatory Visit: Payer: Medicaid Other

## 2023-07-20 ENCOUNTER — Other Ambulatory Visit: Payer: Medicaid Other

## 2023-07-20 ENCOUNTER — Ambulatory Visit: Payer: Medicaid Other | Admitting: Hematology and Oncology

## 2023-07-21 NOTE — Progress Notes (Signed)
HEMATOLOGY-ONCOLOGY TELEPHONE VISIT PROGRESS NOTE  I connected with our patient on 07/23/23 at 10:45 AM EDT by telephone and verified that I am speaking with the correct person using two identifiers.  I discussed the limitations, risks, security and privacy concerns of performing an evaluation and management service by telephone and the availability of in person appointments.  I also discussed with the patient that there may be a patient responsible charge related to this service. The patient expressed understanding and agreed to proceed.   History of Present Illness: April [name] is a 62 year old with above-mentioned metastatic breast cancer is currently on Enhertu treatment. She presents to the clinic for a telephone follow-up.   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)   06/21/2022 - 08/03/2022 Chemotherapy   Patient is on Treatment Plan : BREAST METASTATIC fam-trastuzumab deruxtecan-nxki (Enhertu) q21d     06/21/2022 - 04/19/2023 Chemotherapy   Patient is on Treatment Plan : BREAST METASTATIC Fam-Trastuzumab Deruxtecan-nxki (Enhertu) (5.4) q21d     05/18/2023 -  Chemotherapy   Patient is on Treatment Plan : BREAST METASTATIC Sacituzumab govitecan-hziy [name]) D1,8 q21d     Malignant neoplasm of upper-outer quadrant of right breast in female, estrogen receptor positive (HCC)  06/21/2016 Initial Diagnosis   Malignant neoplasm of upper-outer quadrant of right breast in female, estrogen receptor positive (HCC)   06/21/2016 Initial Biopsy   Right breast biopsy, 10 oclock: IDC, grade 3, ER+(95%), PR+(80%),Ki67 20%, HER-2 negative (ratio 0.67). Right axilla core biopsy: carcinoma, grade 3, ER+(100%), PR+(90%), Ki67 25%, HER-2 negative (ratio 1.13).    07/17/2016 - 11/06/2016 Neo-Adjuvant Chemotherapy   Received 2 cycles of Doxorubicin and Cyclophosphamide, then transitioned  to weekly Paclitaxel (due to repeated febrile neutropenia) x 7 cycles, stopped early due to peripheral neuropathy, then completed her final 2 cycles of Doxorubicin and Cyclophosphamide.    12/19/2016 Surgery   Right breast lumpectomy ([facility]): IDC, grade 2, 1.6cm and 0.3cm, margins negative, 3 SLN positive for metastatic carcinoma.     12/26/2016 Surgery   ALND: metastatic carcinoma in one of 20 lymph nodes, and three nodes from previous lumpectomy.  Four positive nodes, consistent with pN2a.   02/07/2017 - 03/21/2017 Radiation Therapy   Adjuvant radiation Basilio Cairo): 1) Right breast and nodes - 4 field: 50 Gy in 25 fractions. IM NODES: >95% receive at least 45Gy/24fx. 50Gy to SCLV/PAB @ 2Gy /fraction x 25 fractions. 2) Right breast boost: 10 Gy in 5 fractions   04/2017 -  Anti-estrogen oral therapy   Anastrozole 1mg  daily.  Bone density 03/23/2017 finds T score of -2.6, osteoporosis, plan to start Prolia following dental clearance Anastrozole stopped 01/16/18 Exemestane 25 mg daily 02/12/18  On [name], trial randomized to endocrine therapy alone   05/27/2020 Progression   chest CT scan 05/27/2020 shows bulky mediastinal and right hilar lymphadenopathy with right pleural nodules and a small right pleural effusion, no evidence of liver or bone involvement             (a) biopsy of right breast mass 06/02/2020 shows invasive ductal carcinoma, estrogen and progesterone receptor positive, HER-2 not amplified, with an MIB-1 of 40%   06/17/2020 Treatment Plan Change    fulvestrant to start 06/17/2020             (a) palbociclib to start 06/17/2020 at 125 mg daily 21 days on 7 days off             (  b) palbociclib decreased to 125mg  every other day x 11 doses on 07/23/2020             (c) palbociclib dose decreased to100 mg daily, 21/7, starting with September cycle             (d) Palbociclib decreased to 75mg  daily beginning with March cycle due to oral ulcers             (e) palbociclib discontinued  November 2022 with progression   09/14/2021 PET scan   PET scan 09/14/2021 shows progression in liver and bone   10/05/2021 Pathology Results   liver biopsy 10/05/2021 confirms metastatic carcinoma, estrogen receptor strongly positive, progesterone receptor and HER2 negative, with an MIB-1 of 30%.   10/2021 Treatment Plan Change   fulvestrant continued, Xgeva added, palbociclib changed to abemaciclib November 2022 -Dose Decreased to 50mg  PO BID Daily   03/07/2022 Miscellaneous   [facility] molecular testing: ER positive, ER positive, MTAP: Detected, ESR 1 negative, BRCA 1 and 2 negative, MSI stable, PD-L1 negative, PIK 3 CA negative, PR negative,   06/21/2022 - 08/03/2022 Chemotherapy   Patient is on Treatment Plan : BREAST METASTATIC fam-trastuzumab deruxtecan-nxki (Enhertu) q21d     06/21/2022 - 04/19/2023 Chemotherapy   Patient is on Treatment Plan : BREAST METASTATIC Fam-Trastuzumab Deruxtecan-nxki (Enhertu) (5.4) q21d     01/25/2023 Cancer Staging   Staging form: Breast, AJCC 7th Edition - Pathologic: Stage IV (M1) - Signed by Loa Socks, NP on 01/25/2023   05/18/2023 -  Chemotherapy   Patient is on Treatment Plan : BREAST METASTATIC Sacituzumab govitecan-hziy [name]) D1,8 q21d     Breast cancer, stage 4 (HCC)  03/09/2022 Initial Diagnosis   Breast cancer, stage 4 (HCC)   06/21/2022 - 08/03/2022 Chemotherapy   Patient is on Treatment Plan : BREAST METASTATIC fam-trastuzumab deruxtecan-nxki (Enhertu) q21d     06/21/2022 - 04/19/2023 Chemotherapy   Patient is on Treatment Plan : BREAST METASTATIC Fam-Trastuzumab Deruxtecan-nxki (Enhertu) (5.4) q21d     05/18/2023 -  Chemotherapy   Patient is on Treatment Plan : BREAST METASTATIC Sacituzumab govitecan-hziy [name]) D1,8 q21d       REVIEW OF SYSTEMS:   Constitutional: Denies fevers, chills or abnormal weight loss All other systems were reviewed with the patient and are negative. Observations/Objective:     Assessment  Plan:  Malignant neoplasm of upper-outer quadrant of right breast in female, estrogen receptor positive (HCC) 1.  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 bone metastasis: Currently on Xgeva every 3 months.   CT CAP 02/01/2022: Mild progression of disease with increased number and size of the numerous lung nodules in the right lung.  Persistent lymphadenopathy and right hilar and right paratracheal nodal areas, numerous hypovascular hepatic lesions slightly larger when compared to previous.   03/28/2023: CA 27-29: 84.4 04/19/2023: CA 27-29: 126 06/22/2023: CA 27-29: 204   CT CAP 05/07/2023: Interval increase in size of right hepatic metastases 2.7 cm (to be 1.6 cm) and cluster of lesions in the left hepatic lobe are similar, bone metastases stable   Plan: Caris molecular testing: ER positive, ER positive, TMB 5, PD-L1 negative, PIK 3 CA negative  -------------------------------------------------------- Current treatment: Sacituzumab-Govitecan day 1 day 8 every 3 weeks started 05/18/2023, completed 3 cycles Toxicities: Profound pancytopenia: removed day 8 of Trodelvy.   Intractable fatigue and profound generalized weakness: We reduced the dosage further to 3 mg/kg Diarrhea: Related to hypokalemia: Instructed to  take Imodium.   I am concerned about rising CA 27-29 levels.  We would like to get scans after this treatment to assess response to treatment. CT CAP 07/18/2023: Progressive hepatic metastases (previously 2.7 cm is now 3.6 cm, posterior lesion used to be 1 cm now 2 cm, 1.8 cm used to be 1.6 cm).  Stable bone metastases  Recommendation: Switch treatment to Xeloda 1000 mg p.o. twice daily 14 days on 7 days off Xeloda counseling: Discussed the adverse effects of Xeloda and patient appears understand it.   Hand-foot syndrome and diarrhea were also discussed in detail.  I will see her back on 08/03/2023 to see tolerance of  Xeloda.  I discussed the assessment and treatment plan with the patient. The patient was provided an opportunity to ask questions and all were answered. The patient agreed with the plan and demonstrated an understanding of the instructions. The patient was advised to call back or seek an in-person evaluation if the symptoms worsen or if the condition fails to improve as anticipated.   I provided 12 minutes of non-face-to-face time during this encounter.  This includes time for charting and coordination of care   Tamsen Meek, MD  I Janan Ridge am acting as a scribe for Dr.Vinay Gudena  I have reviewed the above documentation for accuracy and completeness, and I agree with the above.

## 2023-07-23 ENCOUNTER — Encounter: Payer: Self-pay | Admitting: Hematology and Oncology

## 2023-07-23 ENCOUNTER — Other Ambulatory Visit: Payer: Self-pay

## 2023-07-23 ENCOUNTER — Other Ambulatory Visit (HOSPITAL_COMMUNITY): Payer: Self-pay

## 2023-07-23 ENCOUNTER — Inpatient Hospital Stay (HOSPITAL_BASED_OUTPATIENT_CLINIC_OR_DEPARTMENT_OTHER): Payer: Medicaid Other | Admitting: Hematology and Oncology

## 2023-07-23 DIAGNOSIS — C50411 Malignant neoplasm of upper-outer quadrant of right female breast: Secondary | ICD-10-CM

## 2023-07-23 DIAGNOSIS — Z170 Estrogen receptor positive status [ER+]: Secondary | ICD-10-CM

## 2023-07-23 MED ORDER — CAPECITABINE 500 MG PO TABS
1000.0000 mg | ORAL_TABLET | Freq: Two times a day (BID) | ORAL | 3 refills | Status: DC
  Filled 2023-07-23: qty 56, 14d supply, fill #0
  Filled 2023-07-31: qty 56, 21d supply, fill #0
  Filled 2023-08-13: qty 56, 21d supply, fill #1
  Filled 2023-09-04: qty 56, 21d supply, fill #2

## 2023-07-23 NOTE — Assessment & Plan Note (Signed)
1.  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 bone metastasis: Currently on Xgeva every 3 months.   CT CAP 02/01/2022: Mild progression of disease with increased number and size of the numerous lung nodules in the right lung.  Persistent lymphadenopathy and right hilar and right paratracheal nodal areas, numerous hypovascular hepatic lesions slightly larger when compared to previous.   03/28/2023: CA 27-29: 84.4 04/19/2023: CA 27-29: 126 06/22/2023: CA 27-29: 204   CT CAP 05/07/2023: Interval increase in size of right hepatic metastases 2.7 cm (to be 1.6 cm) and cluster of lesions in the left hepatic lobe are similar, bone metastases stable   Plan: Caris molecular testing: ER positive, ER positive, TMB 5, PD-L1 negative, PIK 3 CA negative  -------------------------------------------------------- Current treatment: Sacituzumab-Govitecan day 1 day 8 every 3 weeks started 05/18/2023, completed 3 cycles Toxicities: Profound pancytopenia: removed day 8 of Trodelvy.   Intractable fatigue and profound generalized weakness: We reduced the dosage further to 3 mg/kg Diarrhea: Related to hypokalemia: Instructed to take Imodium.   I am concerned about rising CA 27-29 levels.  We would like to get scans after this treatment to assess response to treatment. CT CAP 07/18/2023:

## 2023-07-25 ENCOUNTER — Telehealth: Payer: Self-pay

## 2023-07-25 ENCOUNTER — Telehealth: Payer: Self-pay | Admitting: Pharmacy Technician

## 2023-07-25 ENCOUNTER — Other Ambulatory Visit (HOSPITAL_COMMUNITY): Payer: Self-pay

## 2023-07-25 NOTE — Telephone Encounter (Signed)
Oral Oncology Patient Advocate Encounter  After completing a benefits investigation, prior authorization for capecitabine is not required at this time through [facility].  Patient's copay is $0.     Jinger Neighbors, CPhT-Adv Oncology Pharmacy Patient Advocate [facility] Direct Number: [phone]  Fax: [phone]

## 2023-07-25 NOTE — Telephone Encounter (Signed)
Oral Oncology Pharmacist Encounter  Received new prescription for Xeloda (capecitabine) for the treatment of metastatic breast cancer, planned duration until disease progression or unacceptable toxicity.  Labs from 07/06/23 (CBC, CMP) assessed, no interventions needed.  Prescription dose and frequency assessed for appropriateness.   Current medication list in Epic reviewed, DDIs with Xeloda identified: - omeprazole: PPIs can decrease the concentration of xeloda. Will discuss with patient about switching to famotidine.  - claritin: may increase Qtc prolonging effects of xeloda. No action needed. Will see if patient takes daily.   Evaluated chart and no patient barriers to medication adherence noted.   Patient agreement for treatment documented in MD note on 07/23/2023.  Prescription has been e-scribed to the [facility] for benefits analysis and approval.  Oral Oncology Clinic will continue to follow for insurance authorization, copayment issues, initial counseling and start date.  Bethel Born, PharmD Hematology/Oncology Clinical Pharmacist [facility] [phone] 07/25/2023 9:22 AM

## 2023-07-26 ENCOUNTER — Other Ambulatory Visit (HOSPITAL_COMMUNITY): Payer: Self-pay

## 2023-07-27 ENCOUNTER — Inpatient Hospital Stay: Payer: Medicaid Other

## 2023-07-27 ENCOUNTER — Inpatient Hospital Stay: Payer: Medicaid Other | Admitting: Adult Health

## 2023-07-30 ENCOUNTER — Telehealth: Payer: Self-pay | Admitting: Hematology and Oncology

## 2023-07-30 ENCOUNTER — Encounter: Payer: Self-pay | Admitting: Hematology and Oncology

## 2023-07-30 NOTE — Telephone Encounter (Signed)
Rescheduled and canceled appointment per provider PAL. Patient is aware of the changes made to her upcoming appointments.

## 2023-07-31 ENCOUNTER — Other Ambulatory Visit: Payer: Self-pay

## 2023-07-31 ENCOUNTER — Other Ambulatory Visit: Payer: Self-pay | Admitting: *Deleted

## 2023-07-31 ENCOUNTER — Other Ambulatory Visit (HOSPITAL_COMMUNITY): Payer: Self-pay

## 2023-07-31 DIAGNOSIS — C50919 Malignant neoplasm of unspecified site of unspecified female breast: Secondary | ICD-10-CM

## 2023-07-31 DIAGNOSIS — C50911 Malignant neoplasm of unspecified site of right female breast: Secondary | ICD-10-CM

## 2023-07-31 NOTE — Telephone Encounter (Signed)
Oral Chemotherapy Pharmacist Encounter  I spoke with patient for overview of: Xeloda (capecitabine) for the treatment of metastatic breast cancer in addition of y90 treatment, planned duration until disease progression or unacceptable drug toxicity.   Counseled patient on administration, dosing, side effects, monitoring, drug-food interactions, safe handling, storage, and disposal.  Patient will take Xeloda 500mg  tablets, 2 tablets (1000mg ) by mouth in AM and 2 tabs (1000mg ) by mouth in PM, within 30 minutes of finishing meals, for 14 days on, 7 days off, repeated every 21 days.  Xeloda start date: 08/02/2023  Adverse effects include but are not limited to: fatigue, decreased blood counts, GI upset, diarrhea, mouth sores, and hand-foot syndrome.  Patient states that while she was on it previously she was having some soreness/ changes on her feet due to the side effect of hand foot syndrome. Discussed with patient that she can use diclofenac gel to help prevent any hand foot syndrome.   Patient believes that she has anti-emetic on hand and knows to take it if nausea develops or to call the office if she is in need of any zofran.   Patient will obtain anti diarrheal and alert the office of 4 or more loose stools above baseline.  Patient aware of drug interactions and will try to avoid prilosec while on therapy.   Reviewed with patient importance of keeping a medication schedule and plan for any missed doses. No barriers to medication adherence identified.  Medication reconciliation performed and medication/allergy list updated.  Patient will pick up medication from the [facility] on 08/01/2023. Marland Kitchen  Patient informed the pharmacy will reach out 5-7 days prior to needing next fill of Xeloda to coordinate continued medication acquisition to prevent break in therapy.  All questions answered.  Patient voiced understanding and appreciation.   Medication education handout  placed in mail for patient. Patient knows to call the office with questions or concerns. Oral Chemotherapy Clinic phone number provided to patient.   Bethel Born, PharmD Hematology/Oncology Clinical Pharmacist [facility] [phone]

## 2023-07-31 NOTE — Telephone Encounter (Signed)
Patient successfully OnBoarded and drug education provided by pharmacist. Medication scheduled for pickup on 08/01/23 from [facility]. Patient also knows to call [phone] with any questions or concerns regarding receiving medication or if there is any unexpected change in co-pay.    Ardeen Fillers, CPhT Oncology Pharmacy Patient Advocate  [facility]  [phone] (phone) [phone] (fax) 07/31/2023 2:13 PM

## 2023-08-01 ENCOUNTER — Other Ambulatory Visit (HOSPITAL_COMMUNITY): Payer: Self-pay

## 2023-08-02 ENCOUNTER — Ambulatory Visit
Admission: RE | Admit: 2023-08-02 | Discharge: 2023-08-02 | Disposition: A | Discharge status: Home / Self Care | Payer: Medicaid Other | Source: Ambulatory Visit | Attending: Hematology and Oncology | Admitting: Hematology and Oncology

## 2023-08-02 ENCOUNTER — Encounter: Payer: Self-pay | Admitting: Hematology and Oncology

## 2023-08-02 DIAGNOSIS — C50911 Malignant neoplasm of unspecified site of right female breast: Secondary | ICD-10-CM

## 2023-08-02 DIAGNOSIS — C787 Secondary malignant neoplasm of liver and intrahepatic bile duct: Secondary | ICD-10-CM | POA: Diagnosis not present

## 2023-08-02 DIAGNOSIS — C50919 Malignant neoplasm of unspecified site of unspecified female breast: Secondary | ICD-10-CM

## 2023-08-02 HISTORY — PX: IR RADIOLOGIST EVAL & MGMT: IMG5224
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
## 2023-08-03 ENCOUNTER — Inpatient Hospital Stay: Payer: Medicaid Other

## 2023-08-03 ENCOUNTER — Inpatient Hospital Stay: Payer: Medicaid Other | Admitting: Hematology and Oncology

## 2023-08-03 ENCOUNTER — Other Ambulatory Visit: Payer: Self-pay

## 2023-08-03 VITALS — BP 116/80 | HR 85 | Temp 97.9°F | Resp 20

## 2023-08-03 DIAGNOSIS — C50919 Malignant neoplasm of unspecified site of unspecified female breast: Secondary | ICD-10-CM

## 2023-08-03 DIAGNOSIS — C50911 Malignant neoplasm of unspecified site of right female breast: Secondary | ICD-10-CM

## 2023-08-03 DIAGNOSIS — Z5111 Encounter for antineoplastic chemotherapy: Secondary | ICD-10-CM | POA: Diagnosis not present

## 2023-08-03 LAB — CMP (CANCER CENTER ONLY)
ALT: 15 U/L (ref 0–44)
AST: 24 U/L (ref 15–41)
Albumin: 3.3 g/dL — ABNORMAL LOW (ref 3.5–5.0)
Alkaline Phosphatase: 75 U/L (ref 38–126)
Anion gap: 6 (ref 5–15)
BUN: 12 mg/dL (ref 8–23)
CO2: 23 mmol/L (ref 22–32)
Calcium: 8.6 mg/dL — ABNORMAL LOW (ref 8.9–10.3)
Chloride: 109 mmol/L (ref 98–111)
Creatinine: 0.77 mg/dL (ref 0.44–1.00)
GFR, Estimated: >60 mL/min (ref >60)
Glucose, Bld: 134 mg/dL — ABNORMAL HIGH (ref 70–99)
Potassium: 3.4 mmol/L — ABNORMAL LOW (ref 3.5–5.1)
Sodium: 138 mmol/L (ref 135–145)
Total Bilirubin: 0.5 mg/dL (ref 0.3–1.2)
Total Protein: 6.8 g/dL (ref 6.5–8.1)

## 2023-08-03 LAB — CBC WITH DIFFERENTIAL (CANCER CENTER ONLY)
Abs Immature Granulocytes: 0.01 10*3/uL (ref 0.00–0.07)
Basophils Absolute: 0 10*3/uL (ref 0.0–0.1)
Basophils Relative: 1 %
Eosinophils Absolute: 0 10*3/uL (ref 0.0–0.5)
Eosinophils Relative: 1 %
HCT: 38.9 % (ref 36.0–46.0)
Hemoglobin: 13.3 g/dL (ref 12.0–15.0)
Immature Granulocytes: 0 %
Lymphocytes Relative: 25 %
Lymphs Abs: 1 10*3/uL (ref 0.7–4.0)
MCH: 33.8 pg (ref 26.0–34.0)
MCHC: 34.2 g/dL (ref 30.0–36.0)
MCV: 98.7 fL (ref 80.0–100.0)
Monocytes Absolute: 0.4 10*3/uL (ref 0.1–1.0)
Monocytes Relative: 10 %
Neutro Abs: 2.5 10*3/uL (ref 1.7–7.7)
Neutrophils Relative %: 63 %
Platelet Count: 177 10*3/uL (ref 150–400)
RBC: 3.94 MIL/uL (ref 3.87–5.11)
RDW: 13.3 % (ref 11.5–15.5)
WBC Count: 3.9 10*3/uL — ABNORMAL LOW (ref 4.0–10.5)
nRBC: 0 % (ref 0.0–0.2)

## 2023-08-03 LAB — MAGNESIUM: Magnesium: 2 mg/dL (ref 1.7–2.4)

## 2023-08-03 LAB — PHOSPHORUS: Phosphorus: 3.6 mg/dL (ref 2.5–4.6)

## 2023-08-03 MED ORDER — DENOSUMAB 120 MG/1.7ML ~~LOC~~ SOLN
120.0000 mg | Freq: Once | SUBCUTANEOUS | Status: AC
  Administered 2023-08-03: 120 mg via SUBCUTANEOUS
  Filled 2023-08-03: qty 1.7

## 2023-08-03 MED ORDER — HEPARIN SOD (PORK) LOCK FLUSH 100 UNIT/ML IV SOLN
500.0000 [IU] | Freq: Once | INTRAVENOUS | Status: AC
  Administered 2023-08-03: 500 [IU]

## 2023-08-03 MED ORDER — SODIUM CHLORIDE 0.9% FLUSH
10.0000 mL | Freq: Once | INTRAVENOUS | Status: AC
  Administered 2023-08-03: 10 mL

## 2023-08-04 LAB — CANCER ANTIGEN 27.29: CA 27.29: 295.4 U/mL — ABNORMAL HIGH (ref 0.0–38.6)

## 2023-08-10 ENCOUNTER — Other Ambulatory Visit (HOSPITAL_COMMUNITY): Payer: Self-pay | Admitting: Interventional Radiology

## 2023-08-10 ENCOUNTER — Other Ambulatory Visit (HOSPITAL_COMMUNITY): Payer: Self-pay

## 2023-08-10 ENCOUNTER — Other Ambulatory Visit: Payer: Self-pay | Admitting: Interventional Radiology

## 2023-08-10 DIAGNOSIS — C50919 Malignant neoplasm of unspecified site of unspecified female breast: Secondary | ICD-10-CM

## 2023-08-13 ENCOUNTER — Other Ambulatory Visit (HOSPITAL_COMMUNITY): Payer: Self-pay

## 2023-08-16 ENCOUNTER — Other Ambulatory Visit (HOSPITAL_COMMUNITY): Payer: Self-pay

## 2023-08-16 ENCOUNTER — Other Ambulatory Visit: Payer: Self-pay | Admitting: Radiology

## 2023-08-16 DIAGNOSIS — Z01812 Encounter for preprocedural laboratory examination: Secondary | ICD-10-CM

## 2023-08-16 NOTE — Progress Notes (Signed)
Patient for IR Angiogram Visceral Selective - Y-90 Pre-Mapping on Friday 08/17/2023, I called and spoke with the patient on the phone and gave pre-procedure instructions. Pt was made aware to be here at 10a, NPO after MN prior to procedure as well as driver post procedure/recovery/discharge. Pt stated understanding.  Called 08/16/2023

## 2023-08-17 ENCOUNTER — Ambulatory Visit
Admission: RE | Admit: 2023-08-17 | Discharge: 2023-08-17 | Disposition: A | Discharge status: Home / Self Care | Payer: Medicaid Other | Source: Ambulatory Visit | Attending: Interventional Radiology | Admitting: Interventional Radiology

## 2023-08-17 ENCOUNTER — Encounter
Admission: RE | Admit: 2023-08-17 | Discharge: 2023-08-17 | Disposition: A | Discharge status: Home / Self Care | Payer: Medicaid Other | Source: Ambulatory Visit | Attending: Interventional Radiology | Admitting: Interventional Radiology

## 2023-08-17 ENCOUNTER — Encounter: Payer: Self-pay | Admitting: Radiology

## 2023-08-17 ENCOUNTER — Other Ambulatory Visit: Payer: Self-pay | Admitting: Interventional Radiology

## 2023-08-17 ENCOUNTER — Other Ambulatory Visit: Payer: Self-pay

## 2023-08-17 DIAGNOSIS — K219 Gastro-esophageal reflux disease without esophagitis: Secondary | ICD-10-CM | POA: Insufficient documentation

## 2023-08-17 DIAGNOSIS — C50919 Malignant neoplasm of unspecified site of unspecified female breast: Secondary | ICD-10-CM | POA: Insufficient documentation

## 2023-08-17 DIAGNOSIS — F419 Anxiety disorder, unspecified: Secondary | ICD-10-CM | POA: Insufficient documentation

## 2023-08-17 DIAGNOSIS — C787 Secondary malignant neoplasm of liver and intrahepatic bile duct: Secondary | ICD-10-CM | POA: Insufficient documentation

## 2023-08-17 DIAGNOSIS — M797 Fibromyalgia: Secondary | ICD-10-CM | POA: Diagnosis not present

## 2023-08-17 DIAGNOSIS — F32A Depression, unspecified: Secondary | ICD-10-CM | POA: Diagnosis not present

## 2023-08-17 DIAGNOSIS — C7951 Secondary malignant neoplasm of bone: Secondary | ICD-10-CM | POA: Diagnosis not present

## 2023-08-17 DIAGNOSIS — Z01812 Encounter for preprocedural laboratory examination: Secondary | ICD-10-CM

## 2023-08-17 HISTORY — PX: IR EMBO ARTERIAL NOT HEMORR HEMANG INC GUIDE ROADMAPPING: IMG5448

## 2023-08-17 LAB — PROTIME-INR
INR: 1.1 (ref 0.8–1.2)
Prothrombin Time: 13.9 s (ref 11.4–15.2)

## 2023-08-17 MED ORDER — ONDANSETRON HCL 4 MG/2ML IJ SOLN
INTRAMUSCULAR | Status: AC
  Filled 2023-08-17: qty 2

## 2023-08-17 MED ORDER — NITROGLYCERIN 1 MG/10 ML FOR IR/CATH LAB
200.0000 ug | Freq: Once | INTRA_ARTERIAL | Status: AC
  Administered 2023-08-17: 200 ug via INTRA_ARTERIAL

## 2023-08-17 MED ORDER — IOHEXOL 300 MG/ML  SOLN
150.0000 mL | Freq: Once | INTRAMUSCULAR | Status: AC | PRN
  Administered 2023-08-17: 150 mL via INTRA_ARTERIAL

## 2023-08-17 MED ORDER — MIDAZOLAM HCL 5 MG/5ML IJ SOLN
INTRAMUSCULAR | Status: AC | PRN
  Administered 2023-08-17: .5 mg via INTRAVENOUS

## 2023-08-17 MED ORDER — HEPARIN SOD (PORK) LOCK FLUSH 100 UNIT/ML IV SOLN: 500.0000 [IU] | Freq: Once | INTRAVENOUS | Status: AC

## 2023-08-17 MED ORDER — MIDAZOLAM HCL 2 MG/2ML IJ SOLN
INTRAMUSCULAR | Status: AC | PRN
Start: 2023-08-17 — End: 2023-08-17
  Administered 2023-08-17: .5 mg via INTRAVENOUS
  Administered 2023-08-17: 1 mg via INTRAVENOUS

## 2023-08-17 MED ORDER — TECHNETIUM TO 99M ALBUMIN AGGREGATED
4.2900 | Freq: Once | INTRAVENOUS | Status: AC | PRN
  Administered 2023-08-17: 4.29 via INTRAVENOUS

## 2023-08-17 MED ORDER — FENTANYL CITRATE (PF) 100 MCG/2ML IJ SOLN
INTRAMUSCULAR | Status: AC
  Filled 2023-08-17: qty 2

## 2023-08-17 MED ORDER — NITROGLYCERIN 1 MG/10 ML FOR IR/CATH LAB
INTRA_ARTERIAL | Status: AC
  Filled 2023-08-17: qty 10

## 2023-08-17 MED ORDER — FENTANYL CITRATE (PF) 100 MCG/2ML IJ SOLN
INTRAMUSCULAR | Status: AC | PRN
Start: 2023-08-17 — End: 2023-08-17
  Administered 2023-08-17: 25 ug via INTRAVENOUS
  Administered 2023-08-17: 50 ug via INTRAVENOUS
  Administered 2023-08-17: 25 ug via INTRAVENOUS

## 2023-08-17 MED ORDER — LIDOCAINE HCL 1 % IJ SOLN
5.0000 mL | Freq: Once | INTRAMUSCULAR | Status: AC
  Administered 2023-08-17: 5 mL via INTRADERMAL

## 2023-08-17 MED ORDER — SODIUM CHLORIDE 0.9 % IV SOLN: INTRAVENOUS | Status: DC

## 2023-08-17 MED ORDER — MIDAZOLAM HCL 2 MG/2ML IJ SOLN
INTRAMUSCULAR | Status: AC
  Filled 2023-08-17: qty 2

## 2023-08-17 MED ORDER — HEPARIN SOD (PORK) LOCK FLUSH 100 UNIT/ML IV SOLN
INTRAVENOUS | Status: AC
  Administered 2023-08-17: 500 [IU] via INTRAVENOUS
  Filled 2023-08-17: qty 5

## 2023-08-17 MED ORDER — LIDOCAINE HCL 1 % IJ SOLN
INTRAMUSCULAR | Status: AC
  Filled 2023-08-17: qty 20

## 2023-08-17 MED ORDER — ONDANSETRON HCL 4 MG/2ML IJ SOLN
INTRAMUSCULAR | Status: AC | PRN
  Administered 2023-08-17: 4 mg via INTRAVENOUS

## 2023-08-17 NOTE — Procedures (Signed)
Interventional Radiology Procedure Note  Procedure: Pre-Y90 hepatic angiogram, embolization of accessory right gastric artery  Complications: None  Estimated Blood Loss: None  Recommendations: - Lung-shunt study in molecular imaging - DC home - RIGHT lobar Y90 in 2 weeks  Signed,  Sterling Big, MD
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
## 2023-08-17 NOTE — Progress Notes (Signed)
Patient clinically stable post IR Hepatic angiogram/mapping for Y90 treatment, tolerated well. Vitals stable pre and post procedure. Denies complaints immediately post procedure. Received Versed 2 mg along with Fentanyl 100 mcg IV for procedure. Right groin without bleeding nor hematoma, transported to Nuclear med for final step of procedure prior to upcoming treatment.

## 2023-08-20 ENCOUNTER — Other Ambulatory Visit (HOSPITAL_COMMUNITY): Payer: Self-pay

## 2023-08-24 ENCOUNTER — Other Ambulatory Visit: Payer: Self-pay | Admitting: Radiology

## 2023-08-24 DIAGNOSIS — C50919 Malignant neoplasm of unspecified site of unspecified female breast: Secondary | ICD-10-CM

## 2023-08-24 NOTE — H&P (Signed)
Referring Physician(s): Gudena,V  Supervising Physician: Malachy Moan  Patient Status:  [facility]  Chief Complaint:  Metastatic breast cancer to liver  Subjective:  62 y.o. 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 Patient was initially diagnosed with breast cancer in July 2017 and underwent neoadjuvant chemotherapy with right breast lumpectomy. She also underwent adjuvant radiation therapy. Patient was found in 2021 to have progression of disease in the mediastinum and in 2022 new bone and liver metastatic disease. Imaging from August 2024 showed stable osseous metastatic disease but progressive hepatic disease. Patient was seen in consultation by Dr. Archer Asa on 8/29 and deemed an appropriate candidate for hepatic Y90 treatment.  She is status post arterial roadmapping study and test Y90 dosing on 9/13. She presents today for hepatic Y90 treatment phase, initially in right hepatic lobe.She denies fever, CP,dyspnea, cough, abd pain,N/V or bleeding. She does have HA, upper back pain, left first toe pain.   Past Medical History:  Diagnosis Date   Allergy 07/28/2012   Seasonal/Environmental allergies   Anxiety 2013   Since 2013   Arthritis 2014 per patient    knees and shoulders   Bilateral ankle fractures 07/2015   Booted and resolved    Cancer [facility]) dx June 22, 2016   right breast   Depression 2013   Multiple  episodes  in past.   Elevated cholesterol 2017   Fibromyalgia 2013   diagnosed by Dr. Corliss Skains   Genital herpes 2005   Has outbreaks monthly if not on preventative medication   GERD (gastroesophageal reflux disease) 2013   History of radiation therapy 02/07/17- 03/21/17   Right Breast- 4 field 25 fractions. 50 Gy to SCLV/PAB in 25 fractions. Right Breast Boost 10 gy in 5 fractions.   Migraine 2013   migraines   Neuromuscular disorder (HCC) 03/20/2017    neuropathy in fingers and toes from Chemo--intermittent   Obesity    Osteoporosis 03/23/2017   noted per bone density scan   Peripheral neuropathy 08/13/2017   Personal history of chemotherapy 11/2016   Personal history of radiation therapy    4/18   Right wrist fracture 06/2015   Resolved   Scoliosis of thoracic spine 01/04/2012   Skin condition 2012   patient reports periodic episodes of severe itching. She will itch and then blister at areas including her arms, back, and buttocks.    Urinary, incontinence, stress female 07/14/2016   patient reported   Past Surgical History:  Procedure Laterality Date   AXILLARY LYMPH NODE DISSECTION Right 12/26/2016   Procedure: RIGHT AXILLARY LYMPH NODE DISSECTION;  Surgeon: Glenna Fellows, MD;  Location: [facility];  Service: General;  Laterality: Right;   BREAST LUMPECTOMY Right 2018   BREAST LUMPECTOMY WITH NEEDLE LOCALIZATION Right 12/19/2016   Procedure: RIGHT BREAST NEEDLE LOCALIZED LUMPECTOMY, RIGHT RADIOACTIVE SEED TARGETED AXILLARY SENTINEL LYMPH NODE BIOPSY;  Surgeon: Glenna Fellows, MD;  Location: [facility];  Service: General;  Laterality: Right;   IR EMBO ARTERIAL NOT HEMORR HEMANG INC GUIDE ROADMAPPING  08/17/2023   IR GENERIC HISTORICAL  10/09/2016   IR CV LINE INJECTION 10/09/2016 Irish Lack, MD [facility]-INTERV RAD   IR IMAGING GUIDED PORT INSERTION  06/14/2022   IR RADIOLOGIST EVAL & MGMT  08/02/2023   LAPAROSCOPIC APPENDECTOMY N/A 11/28/2018   Procedure: APPENDECTOMY LAPAROSCOPIC;  Surgeon: Berna Bue, MD;  Location: [facility];  Service: General;  Laterality: N/A;   PORT-A-CATH REMOVAL Left 12/19/2016   Procedure: REMOVAL PORT-A-CATH;  Surgeon: Glenna Fellows, MD;  Location: [facility];  Service: General;  Laterality: Left;   PORTA CATH INSERTION  2021   PORTACATH PLACEMENT N/A 07/11/2016   Procedure: INSERTION PORT-A-CATH;  Surgeon: Glenna Fellows, MD;  Location: [facility];  Service: General;  Laterality: N/A;   RADIOACTIVE SEED GUIDED AXILLARY SENTINEL LYMPH NODE Right 12/19/2016   Procedure: RADIOACTIVE SEED GUIDED AXILLARY SENTINEL LYMPH NODE BIOPSY;  Surgeon: Glenna Fellows, MD;  Location: [facility];  Service: General;  Laterality: Right;   WISDOM TOOTH EXTRACTION  yrs ago      Allergies: Codeine, Cymbalta [duloxetine hcl], Hydrocodone, Ultram [tramadol hcl], Venlafaxine, and Gabapentin  Medications: Prior to Admission medications   Medication Sig Start Date End Date Taking? Authorizing Provider  capecitabine (XELODA) 500 MG tablet Take 2 tablets (1,000 mg total) by mouth 2 (two) times daily after a meal. Take for 14 days and off for 7 days repeat every 21 days 07/23/23   Serena Croissant, MD  carvedilol (COREG) 3.125 MG tablet Take 1 tablet (3.125 mg total) by mouth 2 (two) times daily. 12/25/22   Maisie Fus, MD  Denosumab (XGEVA [place]) Inject 120 mg into the skin every 3 (three) months.    [provider]  dicyclomine (BENTYL) 20 MG tablet Take 1 tablet (20 mg total) by mouth 2 (two) times daily. 05/27/23   Roemhildt, Lorin T, PA-C  fam-trastuzumab deruxtecan-nxki (ENHERTU) 100 MG SOLR Inject 100 mg into the vein every 21 ( twenty-one) days.    [provider]  loratadine (CLARITIN) 10 MG tablet Take 10 mg by mouth daily.    [provider]  naproxen sodium (ALEVE) 220 MG tablet Take 440-660 mg by mouth 2 (two) times daily as needed (pain).    [provider]  omeprazole (PRILOSEC) 20 MG capsule Take 20 mg by mouth daily.    [provider]  ondansetron (ZOFRAN) 8 MG tablet as needed for nausea or vomiting.    [provider]  potassium chloride SA (KLOR-CON M) 20 MEQ tablet Take 1 tablet (20 mEq total) by mouth daily for 4 days. 05/27/23 05/31/23  Roemhildt, Lorin T, PA-C  rizatriptan (MAXALT) 5 MG tablet Take 1 tablet (5 mg total) by mouth as needed for migraine. May repeat in 2 hours  if needed 01/09/23   Loa Socks, NP  simethicone (MYLICON) 125 MG chewable tablet Chew 125 mg by mouth every 6 (six) hours as needed for flatulence.    [provider]  valACYclovir (VALTREX) 1000 MG tablet Take 1 tablet (1,000 mg total) by mouth 2 (two) times daily. 1/2 tab BID Patient taking differently: Take 500 mg by mouth See admin instructions. Take 500 mg daily, may take a second 500 mg dose as needed for outbreaks 12/07/21   Loa Socks, NP     Vital Signs: Vitals:   08/27/23 0800  BP: 127/77  Pulse: 86  Resp: 16  Temp: 97.8 F (36.6 C)  SpO2: 96%      Code Status: FULL CODE  Physical Exam: awake/alert; chest- CTA bilat; heart- RRR; abd-soft,+BS,NT; no LE edema  Imaging: No results found.  Labs:  CBC: Recent Labs    06/15/23 0829 06/22/23 0932 07/06/23 0929 08/03/23 1428  WBC 3.1* 3.7* 3.2* 3.9*  HGB 11.9* 12.8 12.4 13.3  HCT 35.1* 37.8 35.5* 38.9  PLT 224 252 166 177    COAGS:  Recent Labs    08/17/23 1102  INR 1.1    BMP: Recent Labs    06/15/23 0829 06/22/23 0932 07/06/23 0929 08/03/23 1428  NA 139 137 141 138  K 3.9 3.8 3.1* 3.4*  CL 107 106 111 109  CO2 26 26 25 23   GLUCOSE 99 83 87 134*  BUN 14 14 9 12   CALCIUM 9.1 9.0 8.5* 8.6*  CREATININE 0.80 0.73 0.84 0.77  GFRNONAA >60 >60 >60 >60    LIVER FUNCTION TESTS: Recent Labs    06/15/23 0829 06/22/23 0932 07/06/23 0929 08/03/23 1428  BILITOT 0.3 0.3 0.4 0.5  AST 22 25 23 24   ALT 16 18 14 15   ALKPHOS 85 86 82 75  PROT 6.5 6.8 6.3* 6.8  ALBUMIN 3.3* 3.6 3.5 3.3*    Assessment and Plan: 62 y.o. 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 Patient was initially diagnosed with breast cancer in July 2017 and underwent neoadjuvant chemotherapy with right breast lumpectomy. She also underwent adjuvant radiation therapy. Patient was found in  2021 to have progression of disease in the mediastinum and in 2022 new bone and liver metastatic disease. Imaging from August 2024 showed stable osseous metastatic disease but progressive hepatic disease. Patient was seen in consultation by Dr. Archer Asa on 8/29 and deemed an appropriate candidate for hepatic Y90 treatment.  She is status post arterial roadmapping study and test Y90 dosing on 9/13. She presents today for hepatic Y90 treatment phase, initially in right hepatic lobe.Risks and benefits of procedure were discussed with the patient including, but not limited to bleeding, infection, vascular injury or contrast induced renal failure.  This interventional procedure involves the use of X-rays and because of the nature of the planned procedure, it is possible that we will have prolonged use of X-ray fluoroscopy.  Potential radiation risks to you include (but are not limited to) the following: - A slightly elevated risk for cancer  several years later in life. This risk is typically less than 0.5% percent. This risk is low in comparison to the normal incidence of human cancer, which is 33% for women and 50% for men according to the [employer] Society. - Radiation induced injury can include skin redness, resembling a rash, tissue breakdown / ulcers and hair loss (which can be temporary or permanent).   The likelihood of either of these occurring depends on the difficulty of the procedure and whether you are sensitive to radiation due to previous procedures, disease, or genetic conditions.   IF your procedure requires a prolonged use of radiation, you will be notified and given written instructions for further action.  It is your responsibility to monitor the irradiated area for the 2 weeks following the procedure and to notify your physician if you are concerned that you have suffered a radiation induced injury.    All of the patient's questions were answered, patient is agreeable to  proceed.  Consent signed and in chart.   LABS PENDING   Electronically Signed: D. Jeananne Rama, PA-C 08/24/2023, 5:26 PM   I spent a total of 25 minutes at the the patient's bedside AND on the patient's hospital floor or unit, greater than 50% of which was counseling/coordinating care for hepatic/visceral arteriogram with hepatic Y90 radioembolization

## 2023-08-27 ENCOUNTER — Ambulatory Visit (HOSPITAL_COMMUNITY)
Admission: RE | Admit: 2023-08-27 | Discharge: 2023-08-27 | Disposition: A | Discharge status: Home / Self Care | Payer: Medicaid Other | Source: Ambulatory Visit | Attending: Interventional Radiology | Admitting: Interventional Radiology

## 2023-08-27 ENCOUNTER — Other Ambulatory Visit (HOSPITAL_COMMUNITY): Payer: Self-pay | Admitting: Interventional Radiology

## 2023-08-27 ENCOUNTER — Encounter (HOSPITAL_COMMUNITY)
Admission: RE | Admit: 2023-08-27 | Discharge: 2023-08-27 | Disposition: A | Discharge status: Home / Self Care | Payer: Medicaid Other | Source: Ambulatory Visit | Attending: Interventional Radiology | Admitting: Interventional Radiology

## 2023-08-27 ENCOUNTER — Encounter (HOSPITAL_COMMUNITY): Payer: Self-pay

## 2023-08-27 DIAGNOSIS — C787 Secondary malignant neoplasm of liver and intrahepatic bile duct: Secondary | ICD-10-CM | POA: Insufficient documentation

## 2023-08-27 DIAGNOSIS — M797 Fibromyalgia: Secondary | ICD-10-CM | POA: Insufficient documentation

## 2023-08-27 DIAGNOSIS — G43909 Migraine, unspecified, not intractable, without status migrainosus: Secondary | ICD-10-CM | POA: Diagnosis not present

## 2023-08-27 DIAGNOSIS — F419 Anxiety disorder, unspecified: Secondary | ICD-10-CM | POA: Insufficient documentation

## 2023-08-27 DIAGNOSIS — C50919 Malignant neoplasm of unspecified site of unspecified female breast: Secondary | ICD-10-CM | POA: Diagnosis not present

## 2023-08-27 DIAGNOSIS — C7951 Secondary malignant neoplasm of bone: Secondary | ICD-10-CM | POA: Diagnosis not present

## 2023-08-27 DIAGNOSIS — K219 Gastro-esophageal reflux disease without esophagitis: Secondary | ICD-10-CM | POA: Insufficient documentation

## 2023-08-27 DIAGNOSIS — Z923 Personal history of irradiation: Secondary | ICD-10-CM | POA: Insufficient documentation

## 2023-08-27 DIAGNOSIS — E222 Syndrome of inappropriate secretion of antidiuretic hormone: Secondary | ICD-10-CM

## 2023-08-27 HISTORY — PX: IR EMBO TUMOR ORGAN ISCHEMIA INFARCT INC GUIDE ROADMAPPING: IMG5449

## 2023-08-27 HISTORY — PX: IR ANGIOGRAM VISCERAL SELECTIVE: IMG657

## 2023-08-27 HISTORY — PX: IR US GUIDE VASC ACCESS RIGHT: IMG2390

## 2023-08-27 HISTORY — PX: IR ANGIOGRAM SELECTIVE EACH ADDITIONAL VESSEL: IMG667

## 2023-08-27 LAB — CBC WITH DIFFERENTIAL/PLATELET
Abs Immature Granulocytes: 0.01 10*3/uL (ref 0.00–0.07)
Basophils Absolute: 0 10*3/uL (ref 0.0–0.1)
Basophils Relative: 0 %
Eosinophils Absolute: 0.1 10*3/uL (ref 0.0–0.5)
Eosinophils Relative: 2 %
HCT: 35.5 % — ABNORMAL LOW (ref 36.0–46.0)
Hemoglobin: 11.9 g/dL — ABNORMAL LOW (ref 12.0–15.0)
Immature Granulocytes: 0 %
Lymphocytes Relative: 19 %
Lymphs Abs: 0.7 10*3/uL (ref 0.7–4.0)
MCH: 34.2 pg — ABNORMAL HIGH (ref 26.0–34.0)
MCHC: 33.5 g/dL (ref 30.0–36.0)
MCV: 102 fL — ABNORMAL HIGH (ref 80.0–100.0)
Monocytes Absolute: 0.4 10*3/uL (ref 0.1–1.0)
Monocytes Relative: 10 %
Neutro Abs: 2.6 10*3/uL (ref 1.7–7.7)
Neutrophils Relative %: 69 %
Platelets: 171 10*3/uL (ref 150–400)
RBC: 3.48 MIL/uL — ABNORMAL LOW (ref 3.87–5.11)
RDW: 14.7 % (ref 11.5–15.5)
WBC: 3.8 10*3/uL — ABNORMAL LOW (ref 4.0–10.5)
nRBC: 0 % (ref 0.0–0.2)

## 2023-08-27 LAB — COMPREHENSIVE METABOLIC PANEL
ALT: 14 U/L (ref 0–44)
AST: 21 U/L (ref 15–41)
Albumin: 3.1 g/dL — ABNORMAL LOW (ref 3.5–5.0)
Alkaline Phosphatase: 75 U/L (ref 38–126)
Anion gap: 7 (ref 5–15)
BUN: 13 mg/dL (ref 8–23)
CO2: 23 mmol/L (ref 22–32)
Calcium: 8.5 mg/dL — ABNORMAL LOW (ref 8.9–10.3)
Chloride: 106 mmol/L (ref 98–111)
Creatinine, Ser: 0.77 mg/dL (ref 0.44–1.00)
GFR, Estimated: >60 mL/min (ref >60)
Glucose, Bld: 98 mg/dL (ref 70–99)
Potassium: 3.7 mmol/L (ref 3.5–5.1)
Sodium: 136 mmol/L (ref 135–145)
Total Bilirubin: 0.9 mg/dL (ref 0.3–1.2)
Total Protein: 6.3 g/dL — ABNORMAL LOW (ref 6.5–8.1)

## 2023-08-27 LAB — PROTIME-INR
INR: 1 (ref 0.8–1.2)
Prothrombin Time: 13.2 seconds (ref 11.4–15.2)

## 2023-08-27 MED ORDER — FENTANYL CITRATE (PF) 100 MCG/2ML IJ SOLN
INTRAMUSCULAR | Status: AC
  Filled 2023-08-27: qty 2

## 2023-08-27 MED ORDER — YTTRIUM 90 INJECTION: 33.1000 | INJECTION | Freq: Once | INTRAVENOUS | Status: DC

## 2023-08-27 MED ORDER — HEPARIN SOD (PORK) LOCK FLUSH 100 UNIT/ML IV SOLN
500.0000 [IU] | INTRAVENOUS | Status: AC | PRN
  Administered 2023-08-27: 500 [IU]

## 2023-08-27 MED ORDER — SODIUM CHLORIDE 0.9 % IV SOLN: INTRAVENOUS | Status: DC

## 2023-08-27 MED ORDER — IOHEXOL 300 MG/ML  SOLN
50.0000 mL | Freq: Once | INTRAMUSCULAR | Status: AC | PRN
  Administered 2023-08-27: 20 mL via INTRA_ARTERIAL

## 2023-08-27 MED ORDER — SODIUM CHLORIDE 0.9 % IV SOLN
2.0000 g | Freq: Once | INTRAVENOUS | Status: AC
  Administered 2023-08-27: 2 g via INTRAVENOUS

## 2023-08-27 MED ORDER — SODIUM CHLORIDE 0.9 % IV SOLN
8.0000 mg | Freq: Once | INTRAVENOUS | Status: DC
  Filled 2023-08-27: qty 4

## 2023-08-27 MED ORDER — HYDROMORPHONE HCL 1 MG/ML IJ SOLN: 1.0000 mg | Freq: Once | INTRAMUSCULAR | Status: DC

## 2023-08-27 MED ORDER — SODIUM CHLORIDE 0.9 % IV SOLN
INTRAVENOUS | Status: AC
  Filled 2023-08-27: qty 2

## 2023-08-27 MED ORDER — KETOROLAC TROMETHAMINE 30 MG/ML IJ SOLN
30.0000 mg | Freq: Once | INTRAMUSCULAR | Status: AC
  Administered 2023-08-27: 30 mg via INTRAMUSCULAR

## 2023-08-27 MED ORDER — MIDAZOLAM HCL 2 MG/2ML IJ SOLN
INTRAMUSCULAR | Status: AC | PRN
Start: 2023-08-27 — End: 2023-08-27
  Administered 2023-08-27 (×4): .5 mg via INTRAVENOUS

## 2023-08-27 MED ORDER — DEXAMETHASONE SODIUM PHOSPHATE 10 MG/ML IJ SOLN
8.0000 mg | Freq: Once | INTRAMUSCULAR | Status: AC
  Administered 2023-08-27: 8 mg via INTRAVENOUS
  Filled 2023-08-27: qty 1

## 2023-08-27 MED ORDER — LIDOCAINE HCL 1 % IJ SOLN
INTRAMUSCULAR | Status: AC
  Filled 2023-08-27: qty 20

## 2023-08-27 MED ORDER — FENTANYL CITRATE (PF) 100 MCG/2ML IJ SOLN
INTRAMUSCULAR | Status: AC | PRN
  Administered 2023-08-27 (×2): 25 ug via INTRAVENOUS
  Administered 2023-08-27: 50 ug via INTRAVENOUS
  Administered 2023-08-27: 25 ug via INTRAVENOUS

## 2023-08-27 MED ORDER — LIDOCAINE HCL 1 % IJ SOLN
20.0000 mL | Freq: Once | INTRAMUSCULAR | Status: AC
  Administered 2023-08-27: 2 mL via INTRADERMAL

## 2023-08-27 MED ORDER — HYDROMORPHONE HCL-NACL 50-0.9 MG/50ML-% IV SOLN: 1.0000 mg/h | Freq: Once | INTRAVENOUS | Status: DC

## 2023-08-27 MED ORDER — ONDANSETRON 8 MG/NS 50 ML IVPB
8.0000 mg | Freq: Once | INTRAVENOUS | Status: AC
  Administered 2023-08-27: 8 mg via INTRAVENOUS
  Filled 2023-08-27: qty 8

## 2023-08-27 MED ORDER — MIDAZOLAM HCL 2 MG/2ML IJ SOLN
INTRAMUSCULAR | Status: AC
  Filled 2023-08-27: qty 2

## 2023-08-27 MED ORDER — KETOROLAC TROMETHAMINE 30 MG/ML IJ SOLN
30.0000 mg | Freq: Once | INTRAMUSCULAR | Status: DC
  Filled 2023-08-27: qty 1

## 2023-08-27 MED ORDER — IOHEXOL 300 MG/ML  SOLN
100.0000 mL | Freq: Once | INTRAMUSCULAR | Status: AC | PRN
  Administered 2023-08-27: 30 mL via INTRA_ARTERIAL

## 2023-08-27 MED ORDER — HEPARIN SOD (PORK) LOCK FLUSH 100 UNIT/ML IV SOLN
INTRAVENOUS | Status: AC
  Filled 2023-08-27: qty 5

## 2023-08-27 MED ORDER — PANTOPRAZOLE SODIUM 40 MG IV SOLR
40.0000 mg | Freq: Once | INTRAVENOUS | Status: AC
  Administered 2023-08-27: 40 mg via INTRAVENOUS
  Filled 2023-08-27: qty 10

## 2023-08-29 ENCOUNTER — Emergency Department (HOSPITAL_COMMUNITY)
Admission: EM | Admit: 2023-08-29 | Discharge: 2023-08-29 | Discharge status: Against Medical Advice | Payer: Medicaid Other

## 2023-08-29 ENCOUNTER — Emergency Department (HOSPITAL_COMMUNITY): Payer: Medicaid Other

## 2023-08-29 ENCOUNTER — Other Ambulatory Visit: Payer: Self-pay

## 2023-08-29 ENCOUNTER — Encounter (HOSPITAL_COMMUNITY): Payer: Self-pay

## 2023-08-29 DIAGNOSIS — R0789 Other chest pain: Secondary | ICD-10-CM

## 2023-08-29 DIAGNOSIS — R079 Chest pain, unspecified: Secondary | ICD-10-CM | POA: Diagnosis present

## 2023-08-29 DIAGNOSIS — C50919 Malignant neoplasm of unspecified site of unspecified female breast: Secondary | ICD-10-CM | POA: Diagnosis not present

## 2023-08-29 LAB — COMPREHENSIVE METABOLIC PANEL
ALT: 25 U/L (ref 0–44)
AST: 38 U/L (ref 15–41)
Albumin: 3.2 g/dL — ABNORMAL LOW (ref 3.5–5.0)
Alkaline Phosphatase: 73 U/L (ref 38–126)
Anion gap: 7 (ref 5–15)
BUN: 9 mg/dL (ref 8–23)
CO2: 24 mmol/L (ref 22–32)
Calcium: 8.3 mg/dL — ABNORMAL LOW (ref 8.9–10.3)
Chloride: 106 mmol/L (ref 98–111)
Creatinine, Ser: 0.46 mg/dL (ref 0.44–1.00)
GFR, Estimated: >60 mL/min (ref >60)
Glucose, Bld: 93 mg/dL (ref 70–99)
Potassium: 3.7 mmol/L (ref 3.5–5.1)
Sodium: 137 mmol/L (ref 135–145)
Total Bilirubin: 0.7 mg/dL (ref 0.3–1.2)
Total Protein: 6.3 g/dL — ABNORMAL LOW (ref 6.5–8.1)

## 2023-08-29 LAB — CBC
HCT: 37.6 % (ref 36.0–46.0)
Hemoglobin: 12.4 g/dL (ref 12.0–15.0)
MCH: 34 pg (ref 26.0–34.0)
MCHC: 33 g/dL (ref 30.0–36.0)
MCV: 103 fL — ABNORMAL HIGH (ref 80.0–100.0)
Platelets: 147 10*3/uL — ABNORMAL LOW (ref 150–400)
RBC: 3.65 MIL/uL — ABNORMAL LOW (ref 3.87–5.11)
RDW: 14.7 % (ref 11.5–15.5)
WBC: 4.1 10*3/uL (ref 4.0–10.5)
nRBC: 0 % (ref 0.0–0.2)

## 2023-08-29 LAB — TROPONIN I (HIGH SENSITIVITY)
Troponin I (High Sensitivity): 4 ng/L (ref <18)
Troponin I (High Sensitivity): 2 ng/L (ref <18)

## 2023-08-29 LAB — LIPASE, BLOOD: Lipase: 24 U/L (ref 11–51)

## 2023-08-29 MED ORDER — ALUM & MAG HYDROXIDE-SIMETH 200-200-20 MG/5ML PO SUSP
30.0000 mL | Freq: Once | ORAL | Status: AC
  Administered 2023-08-29: 30 mL via ORAL
  Filled 2023-08-29: qty 30

## 2023-08-29 MED ORDER — IOHEXOL 350 MG/ML SOLN
75.0000 mL | Freq: Once | INTRAVENOUS | Status: AC | PRN
  Administered 2023-08-29: 75 mL via INTRAVENOUS

## 2023-08-29 MED ORDER — FAMOTIDINE 20 MG PO TABS
20.0000 mg | ORAL_TABLET | Freq: Once | ORAL | Status: AC
  Administered 2023-08-29: 20 mg via ORAL
  Filled 2023-08-29 (×2): qty 1

## 2023-08-29 MED ORDER — HEPARIN SOD (PORK) LOCK FLUSH 100 UNIT/ML IV SOLN
500.0000 [IU] | Freq: Once | INTRAVENOUS | Status: AC
  Administered 2023-08-29: 500 [IU]
  Filled 2023-08-29: qty 5

## 2023-08-29 MED ORDER — SODIUM CHLORIDE (PF) 0.9 % IJ SOLN
INTRAMUSCULAR | Status: AC
  Filled 2023-08-29: qty 50

## 2023-08-29 NOTE — ED Triage Notes (Signed)
EMS reports from home, Post Y-90 (Yttrium treatment on Monday for breast CA) pain in chest beginning last night.  BP 134/74 HR 84 RR 16 Sp02 100 RA

## 2023-08-29 NOTE — ED Provider Notes (Signed)
[place] EMERGENCY DEPARTMENT AT [facility] Provider Note   CSN: [id number] Arrival date & time: 08/29/23  7829     History  Chief Complaint  Patient presents with   Post Treatment Pain   Chest Pain    April [name] is a 62 y.o. female.  62 year old female to the emergency department for chest pain.  Recently had radiation therapy for breast cancer on Monday.  Developed pain this morning described as tightness across diaphragm also similar to her GERD.  Reports that symptoms have largely improved.  No shortness of breath, no nausea no vomiting.   Chest Pain      Home Medications Prior to Admission medications   Medication Sig Start Date End Date Taking? Authorizing Provider  capecitabine (XELODA) 500 MG tablet Take 2 tablets (1,000 mg total) by mouth 2 (two) times daily after a meal. Take for 14 days and off for 7 days repeat every 21 days 07/23/23   Serena Croissant, MD  carvedilol (COREG) 3.125 MG tablet Take 1 tablet (3.125 mg total) by mouth 2 (two) times daily. 12/25/22   Maisie Fus, MD  Denosumab (XGEVA [place]) Inject 120 mg into the skin every 3 (three) months.    [provider]  dicyclomine (BENTYL) 20 MG tablet Take 1 tablet (20 mg total) by mouth 2 (two) times daily. 05/27/23   Roemhildt, Lorin T, PA-C  fam-trastuzumab deruxtecan-nxki (ENHERTU) 100 MG SOLR Inject 100 mg into the vein every 21 ( twenty-one) days.    [provider]  loratadine (CLARITIN) 10 MG tablet Take 10 mg by mouth daily.    [provider]  naproxen sodium (ALEVE) 220 MG tablet Take 440-660 mg by mouth 2 (two) times daily as needed (pain).    [provider]  omeprazole (PRILOSEC) 20 MG capsule Take 20 mg by mouth daily.    [provider]  ondansetron (ZOFRAN) 8 MG tablet as needed for nausea or vomiting.    [provider]  potassium chloride SA (KLOR-CON M) 20 MEQ tablet Take 1 tablet (20 mEq total) by mouth daily for 4 days.  05/27/23 05/31/23  Roemhildt, Lorin T, PA-C  rizatriptan (MAXALT) 5 MG tablet Take 1 tablet (5 mg total) by mouth as needed for migraine. May repeat in 2 hours if needed 01/09/23   Loa Socks, NP  simethicone (MYLICON) 125 MG chewable tablet Chew 125 mg by mouth every 6 (six) hours as needed for flatulence.    [provider]  valACYclovir (VALTREX) 1000 MG tablet Take 1 tablet (1,000 mg total) by mouth 2 (two) times daily. 1/2 tab BID Patient taking differently: Take 500 mg by mouth See admin instructions. Take 500 mg daily, may take a second 500 mg dose as needed for outbreaks 12/07/21   Loa Socks, NP      Allergies    Codeine, Cymbalta [duloxetine hcl], Hydrocodone, Ultram [tramadol hcl], Venlafaxine, and Gabapentin    Review of Systems   Review of Systems  Cardiovascular:  Positive for chest pain.    Physical Exam Updated Vital Signs BP 123/87   Pulse 75   Temp 97.9 F (36.6 C)   Resp (!) 25   SpO2 99%  Physical Exam Vitals and nursing note reviewed.  Constitutional:      General: She is not in acute distress.    Appearance: She is not toxic-appearing.  Cardiovascular:     Rate and Rhythm: Normal rate and regular rhythm.  Pulses:          Radial pulses are 2+ on the right side and 2+ on the left side.     Heart sounds: Normal heart sounds.  Pulmonary:     Breath sounds: Normal breath sounds.  Abdominal:     Palpations: Abdomen is soft.  Musculoskeletal:     Cervical back: Normal range of motion.     Comments: Right upper extremity with significant edema compared to left.  Patient notes this is chronic and unchanged for the past several years.  Skin:    General: Skin is warm and dry.     Capillary Refill: Capillary refill takes less than 2 seconds.  Neurological:     Mental Status: She is alert and oriented to person, place, and time.  Psychiatric:        Mood and Affect: Mood normal.        Behavior: Behavior normal.     ED  Results / Procedures / Treatments   Labs (all labs ordered are listed, but only abnormal results are displayed) Labs Reviewed  CBC - Abnormal; Notable for the following components:      Result Value   RBC 3.65 (*)    MCV 103.0 (*)    Platelets 147 (*)    All other components within normal limits  COMPREHENSIVE METABOLIC PANEL - Abnormal; Notable for the following components:   Calcium 8.3 (*)    Total Protein 6.3 (*)    Albumin 3.2 (*)    All other components within normal limits  LIPASE, BLOOD  TROPONIN I (HIGH SENSITIVITY)  TROPONIN I (HIGH SENSITIVITY)    EKG None  Radiology CT Angio Chest PE W and/or Wo Contrast  Result Date: 08/29/2023 CLINICAL DATA:  Pulmonary embolism (PE) suspected, high prob. Post Y 90 treatment for breast cancer. Chest pain. EXAM: CT ANGIOGRAPHY CHEST WITH CONTRAST TECHNIQUE: Multidetector CT imaging of the chest was performed using the standard protocol during bolus administration of intravenous contrast. Multiplanar CT image reconstructions and MIPs were obtained to evaluate the vascular anatomy. RADIATION DOSE REDUCTION: This exam was performed according to the departmental dose-optimization program which includes automated exposure control, adjustment of the mA and/or kV according to patient size and/or use of iterative reconstruction technique. CONTRAST:  75mL OMNIPAQUE IOHEXOL 350 MG/ML SOLN COMPARISON:  07/18/2023 FINDINGS: Cardiovascular: No filling defects in the pulmonary arteries to suggest pulmonary emboli. Heart is normal size. Aorta is normal caliber. Mediastinum/Nodes: Right paratracheal lymph node has a short axis diameter of 11 mm. Right hilar lymph node has a short axis diameter of 9 mm. Additional right hilar lymph node has a short axis diameter of 12 mm. Trachea and esophagus are unremarkable. Thyroid unremarkable. Lungs/Pleura: Pleural/parenchymal scarring anteriorly in the right upper lobe compatible with radiation scarring. Trace right  pleural effusion. Right base atelectasis. Ground-glass opacity/mosaic attenuation throughout the lungs could reflect areas of air trapping. Upper Abdomen: Low-density lesions throughout the liver compatible with metastases. The largest in the right hepatic lobe measures 4.6 cm compared to 3.7 cm previously. Adjacent metastasis measures 2.5 cm compared with 2.0 cm previously. Musculoskeletal: Sclerotic focus within the T8 vertebral body is stable, likely treated metastasis. No acute bony abnormality. Review of the MIP images confirms the above findings. IMPRESSION: No evidence of pulmonary embolus. Trace right pleural effusion. Borderline size mediastinal and right hilar lymph nodes appear slightly more prominent than prior study. Enlarging hepatic metastases. Mosaic attenuation throughout the lungs suggesting air trapping. Radiation changes in the anterior  right upper lobe, stable. Electronically Signed   By: Charlett Nose M.D.   On: 08/29/2023 12:15   DG Chest 2 View  Result Date: 08/29/2023 CLINICAL DATA:  Chest discomfort EXAM: CHEST - 2 VIEW COMPARISON:  Chest radiograph 07/03/2022 FINDINGS: The right chest wall port is stable with tip terminating in the region of the cavoatrial junction. The cardiomediastinal silhouette is stable Patchy opacities in the right lung are similar to the prior study and likely reflect scarring. There is no new focal airspace opacity. There is no pulmonary edema. There is no pleural effusion or pneumothorax There is no acute osseous abnormality. Right axillary surgical clips are again noted. IMPRESSION: Stable chest with no radiographic evidence of acute cardiopulmonary process. Electronically Signed   By: Lesia Hausen M.D.   On: 08/29/2023 09:26    Procedures Procedures    Medications Ordered in ED Medications  famotidine (PEPCID) tablet 20 mg (20 mg Oral Given 08/29/23 0914)  alum & mag hydroxide-simeth (MAALOX/MYLANTA) 200-200-20 MG/5ML suspension 30 mL (30 mLs Oral  Given 08/29/23 0914)  iohexol (OMNIPAQUE) 350 MG/ML injection 75 mL (75 mLs Intravenous Contrast Given 08/29/23 1007)    ED Course/ Medical Decision Making/ A&P Clinical Course as of 08/29/23 1324  Wed Aug 29, 2023  1233 CT Angio Chest PE W and/or Wo Contrast IMPRESSION: No evidence of pulmonary embolus.  Trace right pleural effusion.  Borderline size mediastinal and right hilar lymph nodes appear slightly more prominent than prior study.  Enlarging hepatic metastases.  Mosaic attenuation throughout the lungs suggesting air trapping. Radiation changes in the anterior right upper lobe, stable.   [TY]    Clinical Course User Index [TY] Coral Spikes, DO                                 Medical Decision Making 62 year old female present emergency department for chest pain.  Afebrile nontachycardic hemodynamically stable.  Considered ACS, however EKG without ST segment changes acute ischemia.  Initial troponin negative. Chest x-ray without pneumonia pneumothorax.  CTA obtained given patient's active cancer with her chest pain.  Negative for PE, but with findings as noted in ED course.  Patient made aware of findings and will follow-up with cancer doctor.  She is no fever tachycardia leukocytosis to suggest systemic infection.  Normal electrolytes, kidney function.  No evidence of hepatobiliary disease as her AST ALT are not elevated.  Lipase normal.  Pancreatitis unlikely.  I suspect patient's pain secondary to her radiation therapy.  Encouraged her to follow-up with primary doctor. Repeat troponin pending if no significant elevation, will discharge.   Amount and/or Complexity of Data Reviewed Labs: ordered. Radiology: ordered. Decision-making details documented in ED Course.  Risk OTC drugs. Prescription drug management.          Final Clinical Impression(s) / ED Diagnoses Final diagnoses:  None    Rx / DC Orders ED Discharge Orders     None         Coral Spikes, DO 08/29/23 1324

## 2023-08-30 ENCOUNTER — Inpatient Hospital Stay: Payer: Medicaid Other | Attending: Oncology

## 2023-08-30 ENCOUNTER — Telehealth: Payer: Self-pay | Admitting: *Deleted

## 2023-08-30 ENCOUNTER — Inpatient Hospital Stay: Payer: Medicaid Other | Admitting: Physician Assistant

## 2023-08-30 ENCOUNTER — Other Ambulatory Visit: Payer: Self-pay | Admitting: *Deleted

## 2023-08-30 ENCOUNTER — Ambulatory Visit: Payer: Medicaid Other | Admitting: Physician Assistant

## 2023-08-30 VITALS — BP 115/79 | HR 103 | Temp 98.4°F | Resp 18 | Wt 205.8 lb

## 2023-08-30 DIAGNOSIS — C50911 Malignant neoplasm of unspecified site of right female breast: Secondary | ICD-10-CM

## 2023-08-30 DIAGNOSIS — R109 Unspecified abdominal pain: Secondary | ICD-10-CM | POA: Diagnosis not present

## 2023-08-30 DIAGNOSIS — C773 Secondary and unspecified malignant neoplasm of axilla and upper limb lymph nodes: Secondary | ICD-10-CM | POA: Insufficient documentation

## 2023-08-30 DIAGNOSIS — Z170 Estrogen receptor positive status [ER+]: Secondary | ICD-10-CM | POA: Insufficient documentation

## 2023-08-30 DIAGNOSIS — C50411 Malignant neoplasm of upper-outer quadrant of right female breast: Secondary | ICD-10-CM | POA: Insufficient documentation

## 2023-08-30 DIAGNOSIS — Z87891 Personal history of nicotine dependence: Secondary | ICD-10-CM | POA: Diagnosis not present

## 2023-08-30 DIAGNOSIS — C7951 Secondary malignant neoplasm of bone: Secondary | ICD-10-CM | POA: Diagnosis not present

## 2023-08-30 DIAGNOSIS — R000 Tachycardia, unspecified: Secondary | ICD-10-CM | POA: Insufficient documentation

## 2023-08-30 DIAGNOSIS — C787 Secondary malignant neoplasm of liver and intrahepatic bile duct: Secondary | ICD-10-CM | POA: Insufficient documentation

## 2023-08-30 DIAGNOSIS — Z8052 Family history of malignant neoplasm of bladder: Secondary | ICD-10-CM | POA: Diagnosis not present

## 2023-08-30 LAB — CMP (CANCER CENTER ONLY)
ALT: 26 U/L (ref 0–44)
AST: 37 U/L (ref 15–41)
Albumin: 3.7 g/dL (ref 3.5–5.0)
Alkaline Phosphatase: 95 U/L (ref 38–126)
Anion gap: 6 (ref 5–15)
BUN: 10 mg/dL (ref 8–23)
CO2: 28 mmol/L (ref 22–32)
Calcium: 9 mg/dL (ref 8.9–10.3)
Chloride: 105 mmol/L (ref 98–111)
Creatinine: 0.77 mg/dL (ref 0.44–1.00)
GFR, Estimated: >60 mL/min (ref >60)
Glucose, Bld: 104 mg/dL — ABNORMAL HIGH (ref 70–99)
Potassium: 3.8 mmol/L (ref 3.5–5.1)
Sodium: 139 mmol/L (ref 135–145)
Total Bilirubin: 0.7 mg/dL (ref 0.3–1.2)
Total Protein: 7 g/dL (ref 6.5–8.1)

## 2023-08-30 LAB — CBC WITH DIFFERENTIAL (CANCER CENTER ONLY)
Abs Immature Granulocytes: 0.01 10*3/uL (ref 0.00–0.07)
Basophils Absolute: 0 10*3/uL (ref 0.0–0.1)
Basophils Relative: 0 %
Eosinophils Absolute: 0.1 10*3/uL (ref 0.0–0.5)
Eosinophils Relative: 2 %
HCT: 39.1 % (ref 36.0–46.0)
Hemoglobin: 13.4 g/dL (ref 12.0–15.0)
Immature Granulocytes: 0 %
Lymphocytes Relative: 7 %
Lymphs Abs: 0.3 10*3/uL — ABNORMAL LOW (ref 0.7–4.0)
MCH: 33.9 pg (ref 26.0–34.0)
MCHC: 34.3 g/dL (ref 30.0–36.0)
MCV: 99 fL (ref 80.0–100.0)
Monocytes Absolute: 0.5 10*3/uL (ref 0.1–1.0)
Monocytes Relative: 11 %
Neutro Abs: 3.4 10*3/uL (ref 1.7–7.7)
Neutrophils Relative %: 80 %
Platelet Count: 173 10*3/uL (ref 150–400)
RBC: 3.95 MIL/uL (ref 3.87–5.11)
RDW: 14.6 % (ref 11.5–15.5)
WBC Count: 4.3 10*3/uL (ref 4.0–10.5)
nRBC: 0 % (ref 0.0–0.2)

## 2023-08-30 MED ORDER — HEPARIN SOD (PORK) LOCK FLUSH 100 UNIT/ML IV SOLN
500.0000 [IU] | Freq: Once | INTRAVENOUS | Status: AC
  Administered 2023-08-30: 500 [IU] via INTRAVENOUS

## 2023-08-30 MED ORDER — SODIUM CHLORIDE 0.9% FLUSH
10.0000 mL | Freq: Once | INTRAVENOUS | Status: AC
  Administered 2023-08-30: 10 mL

## 2023-08-30 MED ORDER — FAMOTIDINE 20 MG PO TABS
20.0000 mg | ORAL_TABLET | Freq: Once | ORAL | Status: AC
  Administered 2023-08-30: 20 mg via ORAL

## 2023-08-30 MED ORDER — SODIUM CHLORIDE 0.9% FLUSH
10.0000 mL | Freq: Once | INTRAVENOUS | Status: AC
  Administered 2023-08-30: 10 mL via INTRAVENOUS

## 2023-08-30 NOTE — Patient Instructions (Signed)
To help reduce constipation and promote bowel health, 1. Drink at least 64 ounces of water each day; 2. Eat plenty of fiber (fruits, vegetables, whole grains, legumes) 3. Get plenty of physical activity  If needed, you may also use daily or as needed, MiraLax (Osmotic Laxative) up to  1-2 times a day and Colace (Stool Softener aka Docusate) 100 mg up to twice a day to help with bowel movements. These medications are over the counter.  MiraLax is an Osmotic You may use other over-the-counter medications such as Dulcolax, Fleet enemas, magnesium citrate as needed for constipation. Please note that some of these medications may cause you to have abdominal cramping which is normal.   If no bowel movement with Miralax or Colace you can drink 1/2 bottle of magnesium citrate. If no bowel movement in 3 hours you can drink the other half.

## 2023-08-30 NOTE — Progress Notes (Signed)
Pt given po pepcid per Jae Dire, [place] orders. Port flushed and deaccessed. Pt voiced understanding to discharge instructions.

## 2023-08-30 NOTE — Telephone Encounter (Signed)
Received call from pt with complaint of ongoing chest tightness, decreased appetite, and abdominal discomfort.  Per MD pt needing to be seen by [facility].  Appt scheduled, pt educated and verbalized understanding.
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
## 2023-08-30 NOTE — Progress Notes (Signed)
Encounter for medication administration while in clinic. Please see office note from same day for full documentation.

## 2023-09-03 ENCOUNTER — Telehealth: Payer: Self-pay | Admitting: Adult Health

## 2023-09-03 ENCOUNTER — Other Ambulatory Visit (HOSPITAL_COMMUNITY): Payer: Self-pay

## 2023-09-03 NOTE — Telephone Encounter (Signed)
Patient is aware of scheduled appointment times/dates

## 2023-09-04 ENCOUNTER — Other Ambulatory Visit (HOSPITAL_COMMUNITY): Payer: Self-pay

## 2023-09-04 NOTE — Progress Notes (Signed)
Specialty Pharmacy Refill Coordination Note  VIRNA LIVENGOOD is a 62 y.o. female contacted today regarding refills of specialty medication(s) Capecitabine .  Patient requested Delivery  on 09/11/23  to verified address Patient address [address]  [place] [address]   Medication will be filled on 09/10/23.    Specialty Pharmacy Ongoing Clinical Assessment Note  NAIOMY WATTERS is a 62 y.o. female who is being followed by the specialty pharmacy service for RxSp Oncology   Review of patient's specialty medication(s) Capecitabine  occurred today.   Patient has missed 3 (pt was hospitalized and missed 3 doses)  doses in the last 4 weeks.   Patient did not have any additional questions or concerns.   Therapeutic benefit summary: Patient is achieving benefit   Adverse events/side effects summary: Experienced adverse events/side effects (Patient complains of occasional headaches, fatigue, and heartburn.  She reports that all adverse effects are tolerable at this time.)   Patient's therapy is appropriate to : Continue    Goals      Slow Disease Progression     Patient is initiating therapy. Patient will maintain adherence         Follow up:  3 months

## 2023-09-04 NOTE — Addendum Note (Signed)
Encounter addended by: Bonney Aid on: 09/04/2023 10:45 AM  Actions taken: Imaging Exam ended

## 2023-09-05 ENCOUNTER — Other Ambulatory Visit: Payer: Self-pay

## 2023-09-06 ENCOUNTER — Encounter: Payer: Self-pay | Admitting: Adult Health

## 2023-09-10 ENCOUNTER — Other Ambulatory Visit: Payer: Self-pay

## 2023-09-11 ENCOUNTER — Other Ambulatory Visit: Payer: Self-pay | Admitting: *Deleted

## 2023-09-11 DIAGNOSIS — C50911 Malignant neoplasm of unspecified site of right female breast: Secondary | ICD-10-CM

## 2023-09-13 ENCOUNTER — Inpatient Hospital Stay: Payer: Medicaid Other

## 2023-09-13 ENCOUNTER — Inpatient Hospital Stay: Payer: Medicaid Other | Attending: Oncology | Admitting: Adult Health

## 2023-09-13 ENCOUNTER — Encounter: Payer: Self-pay | Admitting: Adult Health

## 2023-09-13 VITALS — BP 114/81 | HR 92 | Temp 97.8°F | Resp 17 | Wt 207.4 lb

## 2023-09-13 DIAGNOSIS — Z923 Personal history of irradiation: Secondary | ICD-10-CM | POA: Diagnosis not present

## 2023-09-13 DIAGNOSIS — Z170 Estrogen receptor positive status [ER+]: Secondary | ICD-10-CM

## 2023-09-13 DIAGNOSIS — C773 Secondary and unspecified malignant neoplasm of axilla and upper limb lymph nodes: Secondary | ICD-10-CM | POA: Insufficient documentation

## 2023-09-13 DIAGNOSIS — Z87891 Personal history of nicotine dependence: Secondary | ICD-10-CM | POA: Diagnosis not present

## 2023-09-13 DIAGNOSIS — C50919 Malignant neoplasm of unspecified site of unspecified female breast: Secondary | ICD-10-CM

## 2023-09-13 DIAGNOSIS — Z9221 Personal history of antineoplastic chemotherapy: Secondary | ICD-10-CM | POA: Insufficient documentation

## 2023-09-13 DIAGNOSIS — C787 Secondary malignant neoplasm of liver and intrahepatic bile duct: Secondary | ICD-10-CM | POA: Insufficient documentation

## 2023-09-13 DIAGNOSIS — Z8052 Family history of malignant neoplasm of bladder: Secondary | ICD-10-CM | POA: Insufficient documentation

## 2023-09-13 DIAGNOSIS — C50411 Malignant neoplasm of upper-outer quadrant of right female breast: Secondary | ICD-10-CM | POA: Diagnosis present

## 2023-09-13 DIAGNOSIS — C50911 Malignant neoplasm of unspecified site of right female breast: Secondary | ICD-10-CM

## 2023-09-13 DIAGNOSIS — C7951 Secondary malignant neoplasm of bone: Secondary | ICD-10-CM | POA: Diagnosis not present

## 2023-09-13 LAB — CMP (CANCER CENTER ONLY)
ALT: 23 U/L (ref 0–44)
AST: 37 U/L (ref 15–41)
Albumin: 3.5 g/dL (ref 3.5–5.0)
Alkaline Phosphatase: 157 U/L — ABNORMAL HIGH (ref 38–126)
Anion gap: 6 (ref 5–15)
BUN: 14 mg/dL (ref 8–23)
CO2: 24 mmol/L (ref 22–32)
Calcium: 8.5 mg/dL — ABNORMAL LOW (ref 8.9–10.3)
Chloride: 109 mmol/L (ref 98–111)
Creatinine: 0.64 mg/dL (ref 0.44–1.00)
GFR, Estimated: >60 mL/min (ref >60)
Glucose, Bld: 82 mg/dL (ref 70–99)
Potassium: 3.5 mmol/L (ref 3.5–5.1)
Sodium: 139 mmol/L (ref 135–145)
Total Bilirubin: 0.5 mg/dL (ref 0.3–1.2)
Total Protein: 6.5 g/dL (ref 6.5–8.1)

## 2023-09-13 LAB — CBC WITH DIFFERENTIAL (CANCER CENTER ONLY)
Abs Immature Granulocytes: 0.01 10*3/uL (ref 0.00–0.07)
Basophils Absolute: 0 10*3/uL (ref 0.0–0.1)
Basophils Relative: 1 %
Eosinophils Absolute: 0.2 10*3/uL (ref 0.0–0.5)
Eosinophils Relative: 4 %
HCT: 35.6 % — ABNORMAL LOW (ref 36.0–46.0)
Hemoglobin: 12.2 g/dL (ref 12.0–15.0)
Immature Granulocytes: 0 %
Lymphocytes Relative: 7 %
Lymphs Abs: 0.2 10*3/uL — ABNORMAL LOW (ref 0.7–4.0)
MCH: 34.7 pg — ABNORMAL HIGH (ref 26.0–34.0)
MCHC: 34.3 g/dL (ref 30.0–36.0)
MCV: 101.1 fL — ABNORMAL HIGH (ref 80.0–100.0)
Monocytes Absolute: 0.4 10*3/uL (ref 0.1–1.0)
Monocytes Relative: 11 %
Neutro Abs: 2.8 10*3/uL (ref 1.7–7.7)
Neutrophils Relative %: 77 %
Platelet Count: 169 10*3/uL (ref 150–400)
RBC: 3.52 MIL/uL — ABNORMAL LOW (ref 3.87–5.11)
RDW: 17 % — ABNORMAL HIGH (ref 11.5–15.5)
WBC Count: 3.7 10*3/uL — ABNORMAL LOW (ref 4.0–10.5)
nRBC: 0 % (ref 0.0–0.2)

## 2023-09-13 MED ORDER — SODIUM CHLORIDE 0.9% FLUSH
10.0000 mL | Freq: Once | INTRAVENOUS | Status: AC
  Administered 2023-09-13: 10 mL

## 2023-09-13 MED ORDER — HEPARIN SOD (PORK) LOCK FLUSH 100 UNIT/ML IV SOLN
500.0000 [IU] | Freq: Once | INTRAVENOUS | Status: AC
  Administered 2023-09-13: 500 [IU]

## 2023-09-14 LAB — CANCER ANTIGEN 27.29: CA 27.29: 329 U/mL — ABNORMAL HIGH (ref 0.0–38.6)

## 2023-09-15 ENCOUNTER — Encounter: Payer: Self-pay | Admitting: Hematology and Oncology

## 2023-09-15 NOTE — Assessment & Plan Note (Signed)
1.  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 bone metastasis: Currently on Xgeva every 3 months.   CT CAP 02/01/2022: Mild progression of disease with increased number and size of the numerous lung nodules in the right lung.  Persistent lymphadenopathy and right hilar and right paratracheal nodal areas, numerous hypovascular hepatic lesions slightly larger when compared to previous.   03/28/2023: CA 27-29: 84.4 04/19/2023: CA 27-29: 126 06/22/2023: CA 27-29: 204   CT CAP 05/07/2023: Interval increase in size of right hepatic metastases 2.7 cm (to be 1.6 cm) and cluster of lesions in the left hepatic lobe are similar, bone metastases stable Plan: [facility] molecular testing: ER positive, ER positive, TMB 5, PD-L1 negative, PIK 3 CA negative CT CAP 07/18/2023: Progressive hepatic metastases (previously 2.7 cm is now 3.6 cm, posterior lesion used to be 1 cm now 2 cm, 1.8 cm used to be 1.6 cm).  Stable bone metastases   Xeloda 1000 mg p.o. twice daily 14 days on 7 days off (began 07/23/2023)  -------------------------------------------------------- Current treatment:  Xeloda 1000mg  PO BID 14 days on 7 days off; Y90 on 08/27/2023  Tolerating well; no significant side effects Right breast mass: well circumscribed and fluctuant, ? Whether it is a seroma.  Will obtain right breast mammogram with ultrasound and put in orders to aspirate since it is slightly tender to April [name] and may provide relief if it continues to enlarge while she is waiting on appointment.   She plans to proceed with Y90 on 10/21--she tells me that this happens during her off week of Xeloda.   RTC in 4 weeks for labs and f/u.
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
## 2023-09-17 ENCOUNTER — Telehealth: Payer: Self-pay | Admitting: Adult Health

## 2023-09-17 NOTE — Telephone Encounter (Signed)
Per [facility] LOS on 10/10 patient is aware of scheduled appointment times/dates for floow up

## 2023-09-20 ENCOUNTER — Other Ambulatory Visit: Payer: Self-pay | Admitting: Adult Health

## 2023-09-20 ENCOUNTER — Ambulatory Visit
Admission: RE | Admit: 2023-09-20 | Discharge: 2023-09-20 | Disposition: A | Discharge status: Home / Self Care | Payer: Medicaid Other | Source: Ambulatory Visit | Attending: Adult Health | Admitting: Adult Health

## 2023-09-20 ENCOUNTER — Telehealth: Payer: Self-pay | Admitting: *Deleted

## 2023-09-20 DIAGNOSIS — Z170 Estrogen receptor positive status [ER+]: Secondary | ICD-10-CM

## 2023-09-20 DIAGNOSIS — C50411 Malignant neoplasm of upper-outer quadrant of right female breast: Secondary | ICD-10-CM | POA: Diagnosis not present

## 2023-09-20 NOTE — Telephone Encounter (Signed)
-----   Message from Noreene Filbert sent at 09/20/2023  1:15 PM EDT ----- Please let April [name] know that I saw this report and the radiologists note that her breast cancer is larger in the breast.  I recommend that she continue on capecitabine and proceed with the Y90 therapy.  Please let her know that Dr. Pamelia Hoit is back on October 28 and I have this waiting for him to review to decide if he wants to do any additional imaging or change anything before he sees her on November 12. ----- Message ----- From: Leory Plowman, Rad Results In Sent: 09/20/2023  11:11 AM EDT To: Loa Socks, NP

## 2023-09-20 NOTE — Telephone Encounter (Signed)
RN placed call to pt with below recommendation.  Pt verbalizes frustration and would like to change therapy asap.  RN alerted NP.

## 2023-09-21 ENCOUNTER — Other Ambulatory Visit: Payer: Medicaid Other

## 2023-09-21 ENCOUNTER — Encounter: Payer: Self-pay | Admitting: Hematology and Oncology

## 2023-09-21 NOTE — Addendum Note (Signed)
Encounter addended by: Lynda Rainwater, RT on: 09/21/2023 12:01 PM  Actions taken: Imaging Exam ended

## 2023-09-24 ENCOUNTER — Ambulatory Visit (HOSPITAL_COMMUNITY): Payer: Medicaid Other

## 2023-09-24 ENCOUNTER — Ambulatory Visit
Admission: RE | Admit: 2023-09-24 | Discharge: 2023-09-24 | Disposition: A | Discharge status: Home / Self Care | Payer: Medicaid Other | Source: Ambulatory Visit | Attending: Interventional Radiology | Admitting: Interventional Radiology

## 2023-09-24 ENCOUNTER — Ambulatory Visit: Payer: Medicaid Other

## 2023-09-24 ENCOUNTER — Other Ambulatory Visit (HOSPITAL_COMMUNITY): Payer: Medicaid Other

## 2023-09-25 ENCOUNTER — Other Ambulatory Visit: Payer: Self-pay

## 2023-10-01 ENCOUNTER — Other Ambulatory Visit: Payer: Self-pay | Admitting: Medical Genetics

## 2023-10-01 DIAGNOSIS — Z006 Encounter for examination for normal comparison and control in clinical research program: Secondary | ICD-10-CM

## 2023-10-02 ENCOUNTER — Inpatient Hospital Stay (HOSPITAL_BASED_OUTPATIENT_CLINIC_OR_DEPARTMENT_OTHER): Payer: Medicaid Other | Admitting: Hematology and Oncology

## 2023-10-02 ENCOUNTER — Encounter: Payer: Self-pay | Admitting: Hematology and Oncology

## 2023-10-02 ENCOUNTER — Other Ambulatory Visit: Payer: Self-pay

## 2023-10-02 ENCOUNTER — Inpatient Hospital Stay: Payer: Medicaid Other

## 2023-10-02 ENCOUNTER — Other Ambulatory Visit (HOSPITAL_COMMUNITY)
Admission: RE | Admit: 2023-10-02 | Discharge: 2023-10-02 | Disposition: A | Discharge status: Home / Self Care | Payer: Medicaid Other | Source: Ambulatory Visit | Attending: Oncology | Admitting: Oncology

## 2023-10-02 VITALS — BP 151/99 | HR 122 | Temp 97.2°F | Resp 18 | Ht 63.0 in | Wt 208.0 lb

## 2023-10-02 DIAGNOSIS — C50911 Malignant neoplasm of unspecified site of right female breast: Secondary | ICD-10-CM

## 2023-10-02 DIAGNOSIS — C773 Secondary and unspecified malignant neoplasm of axilla and upper limb lymph nodes: Secondary | ICD-10-CM | POA: Diagnosis not present

## 2023-10-02 DIAGNOSIS — C50919 Malignant neoplasm of unspecified site of unspecified female breast: Secondary | ICD-10-CM

## 2023-10-02 DIAGNOSIS — Z006 Encounter for examination for normal comparison and control in clinical research program: Secondary | ICD-10-CM | POA: Insufficient documentation

## 2023-10-02 DIAGNOSIS — C50411 Malignant neoplasm of upper-outer quadrant of right female breast: Secondary | ICD-10-CM | POA: Diagnosis not present

## 2023-10-02 DIAGNOSIS — Z170 Estrogen receptor positive status [ER+]: Secondary | ICD-10-CM

## 2023-10-02 LAB — CMP (CANCER CENTER ONLY)
ALT: 21 U/L (ref 0–44)
AST: 36 U/L (ref 15–41)
Albumin: 3.4 g/dL — ABNORMAL LOW (ref 3.5–5.0)
Alkaline Phosphatase: 273 U/L — ABNORMAL HIGH (ref 38–126)
Anion gap: 6 (ref 5–15)
BUN: 9 mg/dL (ref 8–23)
CO2: 24 mmol/L (ref 22–32)
Calcium: 9.4 mg/dL (ref 8.9–10.3)
Chloride: 109 mmol/L (ref 98–111)
Creatinine: 0.7 mg/dL (ref 0.44–1.00)
GFR, Estimated: >60 mL/min (ref >60)
Glucose, Bld: 116 mg/dL — ABNORMAL HIGH (ref 70–99)
Potassium: 3.6 mmol/L (ref 3.5–5.1)
Sodium: 139 mmol/L (ref 135–145)
Total Bilirubin: 0.5 mg/dL (ref 0.3–1.2)
Total Protein: 6.8 g/dL (ref 6.5–8.1)

## 2023-10-02 LAB — CBC WITH DIFFERENTIAL (CANCER CENTER ONLY)
Abs Immature Granulocytes: 0.01 10*3/uL (ref 0.00–0.07)
Basophils Absolute: 0 10*3/uL (ref 0.0–0.1)
Basophils Relative: 0 %
Eosinophils Absolute: 0.1 10*3/uL (ref 0.0–0.5)
Eosinophils Relative: 3 %
HCT: 38.4 % (ref 36.0–46.0)
Hemoglobin: 13.3 g/dL (ref 12.0–15.0)
Immature Granulocytes: 0 %
Lymphocytes Relative: 9 %
Lymphs Abs: 0.3 10*3/uL — ABNORMAL LOW (ref 0.7–4.0)
MCH: 34.8 pg — ABNORMAL HIGH (ref 26.0–34.0)
MCHC: 34.6 g/dL (ref 30.0–36.0)
MCV: 100.5 fL — ABNORMAL HIGH (ref 80.0–100.0)
Monocytes Absolute: 0.4 10*3/uL (ref 0.1–1.0)
Monocytes Relative: 12 %
Neutro Abs: 2.6 10*3/uL (ref 1.7–7.7)
Neutrophils Relative %: 76 %
Platelet Count: 174 10*3/uL (ref 150–400)
RBC: 3.82 MIL/uL — ABNORMAL LOW (ref 3.87–5.11)
RDW: 17.6 % — ABNORMAL HIGH (ref 11.5–15.5)
WBC Count: 3.4 10*3/uL — ABNORMAL LOW (ref 4.0–10.5)
nRBC: 0 % (ref 0.0–0.2)

## 2023-10-02 MED ORDER — LIDOCAINE-PRILOCAINE 2.5-2.5 % EX CREA: TOPICAL_CREAM | CUTANEOUS | 3 refills | Status: DC

## 2023-10-02 MED ORDER — PROCHLORPERAZINE MALEATE 10 MG PO TABS: 10.0000 mg | ORAL_TABLET | Freq: Four times a day (QID) | ORAL | 1 refills | Status: DC | PRN

## 2023-10-02 MED ORDER — ONDANSETRON HCL 8 MG PO TABS: 8.0000 mg | ORAL_TABLET | Freq: Three times a day (TID) | ORAL | 1 refills | Status: DC | PRN

## 2023-10-02 NOTE — Progress Notes (Signed)
DISCONTINUE ON PATHWAY REGIMEN - Breast     A cycle is every 21 days:     Sacituzumab govitecan-hziy   **Always confirm dose/schedule in your pharmacy ordering system**  REASON: Disease Progression PRIOR TREATMENT: [id number]: Sacituzumab Govitecan 10 mg/kg D1, 8 q21 Days TREATMENT RESPONSE: Unable to Evaluate  START ON PATHWAY REGIMEN - Breast     A cycle is every 21 days:     Eribulin mesylate   **Always confirm dose/schedule in your pharmacy ordering system**  Patient Characteristics: Distant Metastases or Locoregional Recurrent Disease - Unresected, M0 or Locally Advanced Unresectable Disease Progressing after Neoadjuvant and Local Therapies, M0, HER2 Low/Negative, ER Positive, Chemotherapy, HER2 Low, Third Line and Beyond, [employer] or Not a Candidate for Molecular Targeted Therapy Therapeutic Status: Distant Metastases HER2 Status: Low ER Status: Positive (+) PR Status: Positive (+) Therapy Approach Indicated: Standard Chemotherapy/Endocrine Therapy Line of Therapy: Third Line and Beyond Intent of Therapy: Non-Curative / Palliative Intent, Discussed with Patient

## 2023-10-02 NOTE — Progress Notes (Signed)
Patient called regarding Xeloda. Medication is discontinued and patient confirms therapy change. Removing from specialty pharmacy program.

## 2023-10-02 NOTE — Progress Notes (Signed)
Patient Care Team: Julieanne Manson, MD as PCP - General (Internal Medicine) Maisie Fus, MD as PCP - Cardiology (Cardiology) Lonie Peak, MD as Attending Physician (Radiation Oncology) Axel Filler, Larna Daughters, NP as Nurse Practitioner (Hematology and Oncology) Berna Bue, MD as Consulting Physician (General Surgery) Felecia Shelling, DPM as Consulting Physician (Podiatry) Graylin Shiver, MD as Consulting Physician (Gastroenterology) Serena Croissant, MD as Attending Physician (Hematology and Oncology)  DIAGNOSIS:  Encounter Diagnosis  Name Primary?   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) Yes    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)   06/21/2022 - 08/03/2022 Chemotherapy   Patient is on Treatment Plan : BREAST METASTATIC fam-trastuzumab deruxtecan-nxki (Enhertu) q21d     06/21/2022 - 04/19/2023 Chemotherapy   Patient is on Treatment Plan : BREAST METASTATIC Fam-Trastuzumab Deruxtecan-nxki (Enhertu) (5.4) q21d     05/18/2023 - 07/06/2023 Chemotherapy   Patient is on Treatment Plan : BREAST METASTATIC Sacituzumab govitecan-hziy [name]) D1,8 q21d     Malignant neoplasm of upper-outer quadrant of right breast in female, estrogen receptor positive (HCC)  06/21/2016 Initial Diagnosis   Malignant neoplasm of upper-outer quadrant of right breast in female, estrogen receptor positive (HCC)   06/21/2016 Initial Biopsy   Right breast biopsy, 10 oclock: IDC, grade 3, ER+(95%), PR+(80%),Ki67 20%, HER-2 negative (ratio 0.67). Right axilla core biopsy: carcinoma, grade 3, ER+(100%), PR+(90%), Ki67 25%, HER-2 negative (ratio 1.13).    07/17/2016 - 11/06/2016 Neo-Adjuvant Chemotherapy   Received 2 cycles of Doxorubicin and Cyclophosphamide, then transitioned to weekly Paclitaxel (due to repeated febrile  neutropenia) x 7 cycles, stopped early due to peripheral neuropathy, then completed her final 2 cycles of Doxorubicin and Cyclophosphamide.    12/19/2016 Surgery   Right breast lumpectomy ([facility]): IDC, grade 2, 1.6cm and 0.3cm, margins negative, 3 SLN positive for metastatic carcinoma.     12/26/2016 Surgery   ALND: metastatic carcinoma in one of 20 lymph nodes, and three nodes from previous lumpectomy.  Four positive nodes, consistent with pN2a.   02/07/2017 - 03/21/2017 Radiation Therapy   Adjuvant radiation Basilio [place]): 1) Right breast and nodes - 4 field: 50 Gy in 25 fractions. IM NODES: >95% receive at least 45Gy/34fx. 50Gy to SCLV/PAB @ 2Gy /fraction x 25 fractions. 2) Right breast boost: 10 Gy in 5 fractions   04/2017 -  Anti-estrogen oral therapy   Anastrozole 1mg  daily.  Bone density 03/23/2017 finds T score of -2.6, osteoporosis, plan to start Prolia following dental clearance Anastrozole stopped 01/16/18 Exemestane 25 mg daily 02/12/18  On [name], trial randomized to endocrine therapy alone   05/27/2020 Progression   chest CT scan 05/27/2020 shows bulky mediastinal and right hilar lymphadenopathy with right pleural nodules and a small right pleural effusion, no evidence of liver or bone involvement             (a) biopsy of right breast mass 06/02/2020 shows invasive ductal carcinoma, estrogen and progesterone receptor positive, HER-2 not amplified, with an MIB-1 of 40%   06/17/2020 Treatment Plan Change    fulvestrant to start 06/17/2020             (a) palbociclib to start 06/17/2020 at 125 mg daily 21 days on 7 days off             (b) palbociclib decreased to 125mg  every other day x 11 doses  on 07/23/2020             (c) palbociclib dose decreased to100 mg daily, 21/7, starting with September cycle             (d) Palbociclib decreased to 75mg  daily beginning with March cycle due to oral ulcers             (e) palbociclib discontinued November 2022 with progression   09/14/2021 PET  scan   PET scan 09/14/2021 shows progression in liver and bone   10/05/2021 Pathology Results   liver biopsy 10/05/2021 confirms metastatic carcinoma, estrogen receptor strongly positive, progesterone receptor and HER2 negative, with an MIB-1 of 30%.   10/2021 Treatment Plan Change   fulvestrant continued, Xgeva added, palbociclib changed to abemaciclib November 2022 -Dose Decreased to 50mg  PO BID Daily   03/07/2022 Miscellaneous   [facility] molecular testing: ER positive, ER positive, MTAP: Detected, ESR 1 negative, BRCA 1 and 2 negative, MSI stable, PD-L1 negative, PIK 3 CA negative, PR negative,   06/21/2022 - 08/03/2022 Chemotherapy   Patient is on Treatment Plan : BREAST METASTATIC fam-trastuzumab deruxtecan-nxki (Enhertu) q21d     06/21/2022 - 04/19/2023 Chemotherapy   Patient is on Treatment Plan : BREAST METASTATIC Fam-Trastuzumab Deruxtecan-nxki (Enhertu) (5.4) q21d     01/25/2023 Cancer Staging   Staging form: Breast, AJCC 7th Edition - Pathologic: Stage IV (M1) - Signed by Loa Socks, NP on 01/25/2023   05/18/2023 - 07/06/2023 Chemotherapy   Patient is on Treatment Plan : BREAST METASTATIC Sacituzumab govitecan-hziy [name]) D1,8 q21d     07/23/2023 -  Chemotherapy   Xeloda 1000 mg bid 14 days on and 7 days off   08/27/2023 Procedure   Y90 hepatic radio embolization   Breast cancer, stage 4 (HCC)  03/09/2022 Initial Diagnosis   Breast cancer, stage 4 (HCC)   06/21/2022 - 08/03/2022 Chemotherapy   Patient is on Treatment Plan : BREAST METASTATIC fam-trastuzumab deruxtecan-nxki (Enhertu) q21d     06/21/2022 - 04/19/2023 Chemotherapy   Patient is on Treatment Plan : BREAST METASTATIC Fam-Trastuzumab Deruxtecan-nxki (Enhertu) (5.4) q21d     05/18/2023 - 07/06/2023 Chemotherapy   Patient is on Treatment Plan : BREAST METASTATIC Sacituzumab govitecan-hziy [name]) D1,8 q21d       CHIEF COMPLIANT: Follow-up to discuss her treatment plan    History of Present Illness    The patient, with a history of Met breast cancer, presents with chest pain under her armpit that has been ongoing for a while. The pain was described as sharp and occurred during stretching exercises. The patient also reports a lump in her breast that has doubled in size, causing concern. She has been on Xeloda since August 19th, but she expresses doubt about its effectiveness due to the increase in the size of the lump and rising tumor markers. The patient also mentions experiencing gas and a catching sensation when taking deep breaths, which she suspects might be due to pleural effusion. She has a history of pleural effusion settling at the bottom of her lungs. The patient is scheduled for a Y90 treatment and is concerned about the potential side effects, having previously experienced severe constipation and chest pains after the treatment.       ALLERGIES:  is allergic to codeine, cymbalta [duloxetine hcl], hydrocodone, ultram [tramadol hcl], venlafaxine, and gabapentin.  MEDICATIONS:  Current Outpatient Medications  Medication Sig Dispense Refill   carvedilol (COREG) 3.125 MG tablet Take 1 tablet (3.125 mg total) by mouth 2 (two) times daily.  180 tablet 3   Cetirizine HCl (ZYRTEC ALLERGY PO) Take 10 mg by mouth daily.     Denosumab (XGEVA [place]) Inject 120 mg into the skin every 3 (three) months.     naproxen sodium (ALEVE) 220 MG tablet Take 440-660 mg by mouth 2 (two) times daily as needed (pain).     omeprazole (PRILOSEC) 20 MG capsule Take 20 mg by mouth daily.     ondansetron (ZOFRAN) 8 MG tablet as needed for nausea or vomiting.     rizatriptan (MAXALT) 5 MG tablet Take 1 tablet (5 mg total) by mouth as needed for migraine. May repeat in 2 hours if needed 10 tablet 0   valACYclovir (VALTREX) 1000 MG tablet Take 1 tablet (1,000 mg total) by mouth 2 (two) times daily. 1/2 tab BID (Patient taking differently: Take 500 mg by mouth See admin instructions. Take 500 mg daily, may take a second 500  mg dose as needed for outbreaks) 180 tablet 4   No current facility-administered medications for this visit.    PHYSICAL EXAMINATION: ECOG PERFORMANCE STATUS: 1 - Symptomatic but completely ambulatory  Vitals:   10/02/23 1014  BP: (!) 151/99  Pulse: (!) 122  Resp: 18  Temp: (!) 97.2 F (36.2 C)  SpO2: 99%   Filed Weights   10/02/23 1014  Weight: 208 lb (94.3 kg)      LABORATORY DATA:  I have reviewed the data as listed    Latest Ref Rng & Units 09/13/2023    2:15 PM 08/30/2023   12:47 PM 08/29/2023    9:06 AM  CMP  Glucose 70 - 99 mg/dL 82  161  93   BUN 8 - 23 mg/dL 14  10  9    Creatinine 0.44 - 1.00 mg/dL 0.96  0.45  4.09   Sodium 135 - 145 mmol/L 139  139  137   Potassium 3.5 - 5.1 mmol/L 3.5  3.8  3.7   Chloride 98 - 111 mmol/L 109  105  106   CO2 22 - 32 mmol/L 24  28  24    Calcium 8.9 - 10.3 mg/dL 8.5  9.0  8.3   Total Protein 6.5 - 8.1 g/dL 6.5  7.0  6.3   Total Bilirubin 0.3 - 1.2 mg/dL 0.5  0.7  0.7   Alkaline Phos 38 - 126 U/L 157  95  73   AST 15 - 41 U/L 37  37  38   ALT 0 - 44 U/L 23  26  25      Lab Results  Component Value Date   WBC 3.4 (L) 10/02/2023   HGB 13.3 10/02/2023   HCT 38.4 10/02/2023   MCV 100.5 (H) 10/02/2023   PLT 174 10/02/2023   NEUTROABS 2.6 10/02/2023    ASSESSMENT & PLAN:  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) 1.  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 bone metastasis: Currently on Xgeva every 3 months.   CT CAP 02/01/2022: Mild progression of disease with increased number and size of the numerous lung nodules in the right lung.  Persistent lymphadenopathy and right hilar and right paratracheal nodal areas, numerous hypovascular hepatic lesions slightly larger when compared to previous.   03/28/2023: CA 27-29: 84.4 04/19/2023: CA 27-29: 126 06/22/2023: CA 27-29: 204 09/13/2023: CA 27-29: 329   CT CAP  05/07/2023: Interval increase in size of right hepatic metastases 2.7 cm (to be 1.6 cm) and cluster  of lesions in the left hepatic lobe are similar, bone metastases stable   Plan: [facility] molecular testing: ER positive, ER positive, TMB 5, PD-L1 negative, PIK 3 CA negative  -------------------------------------------------------- Current treatment: Capecitabine started 07/23/2023 Based on rising tumor markers and progression of liver metastases on the CT scan done on 09/02/2023, I recommended discontinuing capecitabine and switching her to chemotherapy with Halaven.  Y90 treatment scheduled for 10/08/2023 09/20/2023: Mammogram: Enlargement of patient's known right breast cancer 2.3 cm (previously 1 cm)   I sent a message to Dr. Archer Asa to see when we can start Halaven.     Orders Placed This Encounter  Procedures   CA 27.29    Standing Status:   Standing    Number of Occurrences:   50    Standing Expiration Date:   10/01/2024   The patient has a good understanding of the overall plan. she agrees with it. she will call with any problems that may develop before the next visit here. Total time spent: 30 mins including face to face time and time spent for planning, charting and co-ordination of care   Tamsen Meek, MD 10/02/23

## 2023-10-02 NOTE — Assessment & Plan Note (Addendum)
1.  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 bone metastasis: Currently on Xgeva every 3 months.   CT CAP 02/01/2022: Mild progression of disease with increased number and size of the numerous lung nodules in the right lung.  Persistent lymphadenopathy and right hilar and right paratracheal nodal areas, numerous hypovascular hepatic lesions slightly larger when compared to previous.   03/28/2023: CA 27-29: 84.4 04/19/2023: CA 27-29: 126 06/22/2023: CA 27-29: 204 09/13/2023: CA 27-29: 329   CT CAP 05/07/2023: Interval increase in size of right hepatic metastases 2.7 cm (to be 1.6 cm) and cluster of lesions in the left hepatic lobe are similar, bone metastases stable   Plan: [facility] molecular testing: ER positive, ER positive, TMB 5, PD-L1 negative, PIK 3 CA negative  -------------------------------------------------------- Current treatment: Capecitabine started 07/23/2023 Based on rising tumor markers and progression of liver metastases on the CT scan done on 09/02/2023, I recommended discontinuing capecitabine and switching her to chemotherapy with Halaven.  Y90 treatment scheduled for 10/08/2023 09/20/2023: Mammogram: Enlargement of patient's known right breast cancer 2.3 cm (previously 1 cm)   I sent a message to Dr. Archer Asa to see when we can start Halaven.

## 2023-10-03 LAB — CANCER ANTIGEN 27.29: CA 27.29: 340.6 U/mL — ABNORMAL HIGH (ref 0.0–38.6)

## 2023-10-03 NOTE — Progress Notes (Signed)
Pharmacist Chemotherapy Monitoring - Initial Assessment    Anticipated start date: 10/10/23   The following has been reviewed per standard work regarding the patient's treatment regimen: The patient's diagnosis, treatment plan and drug doses, and organ/hematologic function Lab orders and baseline tests specific to treatment regimen  The treatment plan start date, drug sequencing, and pre-medications Prior authorization status  Patient's documented medication list, including drug-drug interaction screen and prescriptions for anti-emetics and supportive care specific to the treatment regimen The drug concentrations, fluid compatibility, administration routes, and timing of the medications to be used The patient's access for treatment and lifetime cumulative dose history, if applicable  The patient's medication allergies and previous infusion related reactions, if applicable   Changes made to treatment plan:  N/A  Follow up needed:  Pending authorization for treatment    Richardean Sale, RPH, BCPS, BCOP 10/03/2023  12:54 PM

## 2023-10-04 ENCOUNTER — Other Ambulatory Visit: Payer: Self-pay

## 2023-10-05 ENCOUNTER — Encounter: Payer: Self-pay | Admitting: Hematology and Oncology

## 2023-10-05 ENCOUNTER — Other Ambulatory Visit: Payer: Self-pay | Admitting: Radiology

## 2023-10-05 ENCOUNTER — Other Ambulatory Visit: Payer: Self-pay | Admitting: Hematology and Oncology

## 2023-10-05 DIAGNOSIS — Z01812 Encounter for preprocedural laboratory examination: Secondary | ICD-10-CM

## 2023-10-05 NOTE — Progress Notes (Signed)
Patient for IR Y90 w/ Nuc Med on Monday 10/08/2023, I called and spoke with the patient on the phone and gave pre-procedure instructions. Pt was made aware to be here at 9a, NPO after MN prior to procedure as well as driver post procedure/recovery/discharge. Pt was talking about taking an [employer] Goad, but I made sure to tell the pt that if she did that she would have to have someone on board that would ride with her that would take liability for her on the way home.  Pt stated understanding.  Called 10/05/2023

## 2023-10-08 ENCOUNTER — Other Ambulatory Visit: Payer: Self-pay

## 2023-10-08 ENCOUNTER — Encounter: Payer: Self-pay | Admitting: Radiology

## 2023-10-08 ENCOUNTER — Ambulatory Visit
Admission: RE | Admit: 2023-10-08 | Discharge: 2023-10-08 | Disposition: A | Discharge status: Home / Self Care | Payer: Medicaid Other | Source: Ambulatory Visit | Attending: Interventional Radiology | Admitting: Interventional Radiology

## 2023-10-08 ENCOUNTER — Telehealth: Payer: Self-pay

## 2023-10-08 DIAGNOSIS — F419 Anxiety disorder, unspecified: Secondary | ICD-10-CM | POA: Insufficient documentation

## 2023-10-08 DIAGNOSIS — F32A Depression, unspecified: Secondary | ICD-10-CM | POA: Diagnosis not present

## 2023-10-08 DIAGNOSIS — G40909 Epilepsy, unspecified, not intractable, without status epilepticus: Secondary | ICD-10-CM | POA: Diagnosis not present

## 2023-10-08 DIAGNOSIS — M797 Fibromyalgia: Secondary | ICD-10-CM | POA: Insufficient documentation

## 2023-10-08 DIAGNOSIS — C7951 Secondary malignant neoplasm of bone: Secondary | ICD-10-CM | POA: Insufficient documentation

## 2023-10-08 DIAGNOSIS — C787 Secondary malignant neoplasm of liver and intrahepatic bile duct: Secondary | ICD-10-CM | POA: Diagnosis present

## 2023-10-08 DIAGNOSIS — C50919 Malignant neoplasm of unspecified site of unspecified female breast: Secondary | ICD-10-CM

## 2023-10-08 DIAGNOSIS — K219 Gastro-esophageal reflux disease without esophagitis: Secondary | ICD-10-CM | POA: Diagnosis not present

## 2023-10-08 DIAGNOSIS — Z9221 Personal history of antineoplastic chemotherapy: Secondary | ICD-10-CM | POA: Insufficient documentation

## 2023-10-08 DIAGNOSIS — Z87891 Personal history of nicotine dependence: Secondary | ICD-10-CM | POA: Insufficient documentation

## 2023-10-08 DIAGNOSIS — Z01812 Encounter for preprocedural laboratory examination: Secondary | ICD-10-CM

## 2023-10-08 HISTORY — PX: IR EMBO TUMOR ORGAN ISCHEMIA INFARCT INC GUIDE ROADMAPPING: IMG5449

## 2023-10-08 LAB — PROTIME-INR
INR: 1.1 (ref 0.8–1.2)
Prothrombin Time: 14.2 s (ref 11.4–15.2)

## 2023-10-08 MED ORDER — FENTANYL CITRATE (PF) 100 MCG/2ML IJ SOLN
INTRAMUSCULAR | Status: AC | PRN
  Administered 2023-10-08: 50 ug via INTRAVENOUS
  Administered 2023-10-08: 25 ug via INTRAVENOUS

## 2023-10-08 MED ORDER — FENTANYL CITRATE (PF) 100 MCG/2ML IJ SOLN
INTRAMUSCULAR | Status: AC
  Filled 2023-10-08: qty 2

## 2023-10-08 MED ORDER — PANTOPRAZOLE SODIUM 40 MG IV SOLR
40.0000 mg | Freq: Once | INTRAVENOUS | Status: AC
  Administered 2023-10-08: 40 mg via INTRAVENOUS
  Filled 2023-10-08: qty 10

## 2023-10-08 MED ORDER — SODIUM CHLORIDE 0.9 % IV SOLN
8.0000 mg | Freq: Once | INTRAVENOUS | Status: AC
  Administered 2023-10-08: 8 mg via INTRAVENOUS
  Filled 2023-10-08: qty 4

## 2023-10-08 MED ORDER — MIDAZOLAM HCL 2 MG/2ML IJ SOLN
INTRAMUSCULAR | Status: AC | PRN
  Administered 2023-10-08 (×2): 1 mg via INTRAVENOUS

## 2023-10-08 MED ORDER — IOHEXOL 300 MG/ML  SOLN: 95.0000 mL | Freq: Once | INTRAMUSCULAR | Status: DC | PRN

## 2023-10-08 MED ORDER — MIDAZOLAM HCL 2 MG/2ML IJ SOLN
INTRAMUSCULAR | Status: AC
  Filled 2023-10-08: qty 4

## 2023-10-08 MED ORDER — LIDOCAINE HCL 1 % IJ SOLN
INTRAMUSCULAR | Status: AC
  Filled 2023-10-08: qty 20

## 2023-10-08 MED ORDER — SODIUM CHLORIDE 0.9 % IV SOLN: INTRAVENOUS | Status: DC

## 2023-10-08 MED ORDER — SODIUM CHLORIDE 0.9 % IV SOLN
2.0000 g | Freq: Once | INTRAVENOUS | Status: AC
  Administered 2023-10-08: 2 g via INTRAVENOUS
  Filled 2023-10-08: qty 2

## 2023-10-08 MED ORDER — DEXAMETHASONE SODIUM PHOSPHATE 10 MG/ML IJ SOLN
8.0000 mg | Freq: Once | INTRAMUSCULAR | Status: AC
  Administered 2023-10-08: 8 mg via INTRAVENOUS
  Filled 2023-10-08: qty 0.8

## 2023-10-08 MED ORDER — HEPARIN SOD (PORK) LOCK FLUSH 100 UNIT/ML IV SOLN
INTRAVENOUS | Status: AC
  Administered 2023-10-08: 500 [IU]
  Filled 2023-10-08: qty 5

## 2023-10-08 MED ORDER — YTTRIUM 90 INJECTION
22.4000 | INJECTION | Freq: Once | INTRAVENOUS | Status: AC | PRN
  Administered 2023-10-08: 22.4 via INTRA_ARTERIAL

## 2023-10-08 MED ORDER — LIDOCAINE HCL 1 % IJ SOLN: 4.0000 mL | Freq: Once | INTRAMUSCULAR | Status: DC

## 2023-10-08 NOTE — Addendum Note (Signed)
Encounter addended by: Ermalinda Barrios, RT on: 10/08/2023 2:55 PM  Actions taken: Imaging Exam ended

## 2023-10-08 NOTE — Telephone Encounter (Signed)
Pt called concerned about why appts are still present for 11/6. Message sent to scheduling 10/05/23 to change schedule to 10/15/23. Pt is aware she should not come in 10/10/23.
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
## 2023-10-08 NOTE — Procedures (Signed)
Interventional Radiology Procedure Note  Procedure: LEFT lobar TARE with Y90  Complications: None immediate  Estimated Blood Loss: None  Recommendations: - To MI for imaging - Bedrest x 1 hr - DC home   Signed,  Sterling Big, MD

## 2023-10-08 NOTE — Written Directive (Addendum)
MOLECULAR IMAGING AND THERAPEUTICS WRITTEN DIRECTIVE   PATIENT NAME: April [name]  PT DOB:   1961/07/18                                              MRN: [id number]  ---------------------------------------------------------------------------------------------------------------------  YTTRIUM-90 SIR-Spheres THERAPY   RADIOPHARMACEUTICAL: Yttrium-90 Resin Microspheres (SIR-Spheres) Appt: 10/08/23  @ 1000  PRESCRIBED DOSE FOR ADMINISTRATION: 0.8 GBq   ROUTE OFADMINISTRATION: via established catheter line placed in hepatic artery   DIAGNOSIS:  Adenocarcinoma of breast metastatic to liver, unspecified laterality (HCC)   PREVIOUS TREATMENT (DATE AND DOSE): Right lobar 08/27/23   DATE OF PRE Y-90 SCAN:  08/17/23    LUNG SHUNT %:  5   TARGET REGION OF LIVER:   Right []   Left  [x]     ADDITIONAL PHYSICIAN COMMENTS/NOTES   AUTHORIZED USER SIGNATURE & TIME STAMP:

## 2023-10-09 ENCOUNTER — Telehealth: Payer: Self-pay | Admitting: Hematology and Oncology

## 2023-10-09 ENCOUNTER — Inpatient Hospital Stay: Payer: Medicare Other | Admitting: Pharmacist

## 2023-10-09 NOTE — Telephone Encounter (Signed)
Left patient a message regarding scheduled appointment dates/times left call back if needed for rescheduling

## 2023-10-10 ENCOUNTER — Encounter: Payer: Self-pay | Admitting: Hematology and Oncology

## 2023-10-10 ENCOUNTER — Inpatient Hospital Stay: Payer: Medicare Other

## 2023-10-10 ENCOUNTER — Inpatient Hospital Stay: Payer: Medicare Other | Admitting: Hematology and Oncology

## 2023-10-11 ENCOUNTER — Other Ambulatory Visit: Payer: Self-pay

## 2023-10-11 ENCOUNTER — Encounter: Payer: Self-pay | Admitting: Hematology and Oncology

## 2023-10-16 ENCOUNTER — Other Ambulatory Visit: Payer: Medicaid Other

## 2023-10-16 ENCOUNTER — Inpatient Hospital Stay: Payer: Medicare Other

## 2023-10-16 ENCOUNTER — Inpatient Hospital Stay: Payer: Medicare Other | Attending: Oncology | Admitting: Hematology and Oncology

## 2023-10-16 ENCOUNTER — Ambulatory Visit: Payer: Medicaid Other | Admitting: Hematology and Oncology

## 2023-10-16 VITALS — BP 149/93 | HR 112 | Temp 97.3°F | Resp 18 | Ht 64.0 in | Wt 200.6 lb

## 2023-10-16 VITALS — BP 106/78 | HR 80 | Resp 16

## 2023-10-16 DIAGNOSIS — T451X5A Adverse effect of antineoplastic and immunosuppressive drugs, initial encounter: Secondary | ICD-10-CM | POA: Insufficient documentation

## 2023-10-16 DIAGNOSIS — C50911 Malignant neoplasm of unspecified site of right female breast: Secondary | ICD-10-CM

## 2023-10-16 DIAGNOSIS — R6881 Early satiety: Secondary | ICD-10-CM | POA: Diagnosis not present

## 2023-10-16 DIAGNOSIS — R748 Abnormal levels of other serum enzymes: Secondary | ICD-10-CM | POA: Insufficient documentation

## 2023-10-16 DIAGNOSIS — K1379 Other lesions of oral mucosa: Secondary | ICD-10-CM | POA: Insufficient documentation

## 2023-10-16 DIAGNOSIS — K5900 Constipation, unspecified: Secondary | ICD-10-CM | POA: Insufficient documentation

## 2023-10-16 DIAGNOSIS — Z170 Estrogen receptor positive status [ER+]: Secondary | ICD-10-CM | POA: Insufficient documentation

## 2023-10-16 DIAGNOSIS — Z79624 Long term (current) use of inhibitors of nucleotide synthesis: Secondary | ICD-10-CM | POA: Insufficient documentation

## 2023-10-16 DIAGNOSIS — K1231 Oral mucositis (ulcerative) due to antineoplastic therapy: Secondary | ICD-10-CM | POA: Diagnosis not present

## 2023-10-16 DIAGNOSIS — C787 Secondary malignant neoplasm of liver and intrahepatic bile duct: Secondary | ICD-10-CM | POA: Diagnosis not present

## 2023-10-16 DIAGNOSIS — Z79899 Other long term (current) drug therapy: Secondary | ICD-10-CM | POA: Insufficient documentation

## 2023-10-16 DIAGNOSIS — Z923 Personal history of irradiation: Secondary | ICD-10-CM | POA: Diagnosis not present

## 2023-10-16 DIAGNOSIS — C50411 Malignant neoplasm of upper-outer quadrant of right female breast: Secondary | ICD-10-CM | POA: Diagnosis not present

## 2023-10-16 DIAGNOSIS — C773 Secondary and unspecified malignant neoplasm of axilla and upper limb lymph nodes: Secondary | ICD-10-CM | POA: Insufficient documentation

## 2023-10-16 DIAGNOSIS — D701 Agranulocytosis secondary to cancer chemotherapy: Secondary | ICD-10-CM | POA: Diagnosis not present

## 2023-10-16 DIAGNOSIS — D696 Thrombocytopenia, unspecified: Secondary | ICD-10-CM | POA: Insufficient documentation

## 2023-10-16 DIAGNOSIS — M62838 Other muscle spasm: Secondary | ICD-10-CM | POA: Diagnosis not present

## 2023-10-16 DIAGNOSIS — E876 Hypokalemia: Secondary | ICD-10-CM | POA: Insufficient documentation

## 2023-10-16 DIAGNOSIS — C7951 Secondary malignant neoplasm of bone: Secondary | ICD-10-CM | POA: Insufficient documentation

## 2023-10-16 DIAGNOSIS — Z5111 Encounter for antineoplastic chemotherapy: Secondary | ICD-10-CM | POA: Insufficient documentation

## 2023-10-16 DIAGNOSIS — C50919 Malignant neoplasm of unspecified site of unspecified female breast: Secondary | ICD-10-CM

## 2023-10-16 LAB — CMP (CANCER CENTER ONLY)
ALT: 27 U/L (ref 0–44)
AST: 52 U/L — ABNORMAL HIGH (ref 15–41)
Albumin: 3.5 g/dL (ref 3.5–5.0)
Alkaline Phosphatase: 300 U/L — ABNORMAL HIGH (ref 38–126)
Anion gap: 8 (ref 5–15)
BUN: 12 mg/dL (ref 8–23)
CO2: 25 mmol/L (ref 22–32)
Calcium: 8.9 mg/dL (ref 8.9–10.3)
Chloride: 106 mmol/L (ref 98–111)
Creatinine: 0.7 mg/dL (ref 0.44–1.00)
GFR, Estimated: >60 mL/min (ref >60)
Glucose, Bld: 120 mg/dL — ABNORMAL HIGH (ref 70–99)
Potassium: 3.3 mmol/L — ABNORMAL LOW (ref 3.5–5.1)
Sodium: 139 mmol/L (ref 135–145)
Total Bilirubin: 0.9 mg/dL (ref <1.2)
Total Protein: 6.9 g/dL (ref 6.5–8.1)

## 2023-10-16 LAB — CBC WITH DIFFERENTIAL (CANCER CENTER ONLY)
Abs Immature Granulocytes: 0.01 10*3/uL (ref 0.00–0.07)
Basophils Absolute: 0 10*3/uL (ref 0.0–0.1)
Basophils Relative: 1 %
Eosinophils Absolute: 0.1 10*3/uL (ref 0.0–0.5)
Eosinophils Relative: 4 %
HCT: 40.1 % (ref 36.0–46.0)
Hemoglobin: 14 g/dL (ref 12.0–15.0)
Immature Granulocytes: 0 %
Lymphocytes Relative: 7 %
Lymphs Abs: 0.2 10*3/uL — ABNORMAL LOW (ref 0.7–4.0)
MCH: 34.8 pg — ABNORMAL HIGH (ref 26.0–34.0)
MCHC: 34.9 g/dL (ref 30.0–36.0)
MCV: 99.8 fL (ref 80.0–100.0)
Monocytes Absolute: 0.5 10*3/uL (ref 0.1–1.0)
Monocytes Relative: 18 %
Neutro Abs: 1.9 10*3/uL (ref 1.7–7.7)
Neutrophils Relative %: 70 %
Platelet Count: 187 10*3/uL (ref 150–400)
RBC: 4.02 MIL/uL (ref 3.87–5.11)
RDW: 16.4 % — ABNORMAL HIGH (ref 11.5–15.5)
WBC Count: 2.7 10*3/uL — ABNORMAL LOW (ref 4.0–10.5)
nRBC: 0 % (ref 0.0–0.2)

## 2023-10-16 LAB — MAGNESIUM: Magnesium: 1.9 mg/dL (ref 1.7–2.4)

## 2023-10-16 MED ORDER — METHYLPREDNISOLONE SODIUM SUCC 125 MG IJ SOLR
125.0000 mg | Freq: Once | INTRAMUSCULAR | Status: AC | PRN
  Administered 2023-10-16: 125 mg via INTRAVENOUS

## 2023-10-16 MED ORDER — FAMOTIDINE IN NACL 20-0.9 MG/50ML-% IV SOLN
20.0000 mg | Freq: Once | INTRAVENOUS | Status: AC | PRN
  Administered 2023-10-16: 20 mg via INTRAVENOUS

## 2023-10-16 MED ORDER — SODIUM CHLORIDE 0.9% FLUSH
10.0000 mL | Freq: Once | INTRAVENOUS | Status: AC
  Administered 2023-10-16: 10 mL

## 2023-10-16 MED ORDER — CETIRIZINE HCL 10 MG/ML IV SOLN
10.0000 mg | Freq: Once | INTRAVENOUS | Status: AC
  Administered 2023-10-16: 10 mg via INTRAVENOUS

## 2023-10-16 MED ORDER — PROCHLORPERAZINE MALEATE 10 MG PO TABS
10.0000 mg | ORAL_TABLET | Freq: Once | ORAL | Status: AC
  Administered 2023-10-16: 10 mg via ORAL
  Filled 2023-10-16: qty 1

## 2023-10-16 MED ORDER — SODIUM CHLORIDE 0.9 % IV SOLN
Freq: Once | INTRAVENOUS | Status: DC | PRN
Start: 2023-10-16 — End: 2023-10-16

## 2023-10-16 MED ORDER — SODIUM CHLORIDE 0.9% FLUSH
10.0000 mL | INTRAVENOUS | Status: DC | PRN
  Administered 2023-10-16: 10 mL

## 2023-10-16 MED ORDER — SODIUM CHLORIDE 0.9 % IV SOLN
1.4000 mg/m2 | Freq: Once | INTRAVENOUS | Status: AC
  Administered 2023-10-16: 3 mg via INTRAVENOUS
  Filled 2023-10-16: qty 6

## 2023-10-16 MED ORDER — SODIUM CHLORIDE 0.9 % IV SOLN
INTRAVENOUS | Status: DC
Start: 2023-10-16 — End: 2023-10-16

## 2023-10-16 MED ORDER — HEPARIN SOD (PORK) LOCK FLUSH 100 UNIT/ML IV SOLN
500.0000 [IU] | Freq: Once | INTRAVENOUS | Status: AC | PRN
  Administered 2023-10-16: 500 [IU]

## 2023-10-16 NOTE — Assessment & Plan Note (Signed)
1.  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 bone metastasis: Currently on Xgeva every 3 months.   CT CAP 02/01/2022: Mild progression of disease with increased number and size of the numerous lung nodules in the right lung.  Persistent lymphadenopathy and right hilar and right paratracheal nodal areas, numerous hypovascular hepatic lesions slightly larger when compared to previous.   03/28/2023: CA 27-29: 84.4 04/19/2023: CA 27-29: 126 06/22/2023: CA 27-29: 204 09/13/2023: CA 27-29: 329   CT CAP 05/07/2023: Interval increase in size of right hepatic metastases 2.7 cm (to be 1.6 cm) and cluster of lesions in the left hepatic lobe are similar, bone metastases stable   Plan: [facility] molecular testing: ER positive, ER positive, TMB 5, PD-L1 negative, PIK 3 CA negative  -------------------------------------------------------- Current treatment: Cycle 1 eribulin

## 2023-10-16 NOTE — Patient Instructions (Signed)
[facility] - A DEPT OF [facility]  Discharge Instructions: Thank you for choosing  [facility] to provide your oncology and hematology care.   If you have a lab appointment with the Cancer Center, please go directly to the Cancer Center and check in at the registration area.   Wear comfortable clothing and clothing appropriate for easy access to any Portacath or PICC line.   We strive to give you quality time with your provider. You may need to reschedule your appointment if you arrive late (15 or more minutes).  Arriving late affects you and other patients whose appointments are after yours.  Also, if you miss three or more appointments without notifying the office, you may be dismissed from the clinic at the provider's discretion.      For prescription refill requests, have your pharmacy contact our office and allow 72 hours for refills to be completed.    Today you received the following chemotherapy and/or immunotherapy agents: Eribulin (Halaven)   To help prevent nausea and vomiting after your treatment, we encourage you to take your nausea medication as directed.  BELOW ARE SYMPTOMS THAT SHOULD BE REPORTED IMMEDIATELY: *FEVER GREATER THAN 100.4 F (38 C) OR HIGHER *CHILLS OR SWEATING *NAUSEA AND VOMITING THAT IS NOT CONTROLLED WITH YOUR NAUSEA MEDICATION *UNUSUAL SHORTNESS OF BREATH *UNUSUAL BRUISING OR BLEEDING *URINARY PROBLEMS (pain or burning when urinating, or frequent urination) *BOWEL PROBLEMS (unusual diarrhea, constipation, pain near the anus) TENDERNESS IN MOUTH AND THROAT WITH OR WITHOUT PRESENCE OF ULCERS (sore throat, sores in mouth, or a toothache) UNUSUAL RASH, SWELLING OR PAIN  UNUSUAL VAGINAL DISCHARGE OR ITCHING   Items with * indicate a potential emergency and should be followed up as soon as possible or go to the Emergency Department if any problems should occur.  Please show the CHEMOTHERAPY ALERT CARD or  IMMUNOTHERAPY ALERT CARD at check-in to the Emergency Department and triage nurse.  Should you have questions after your visit or need to cancel or reschedule your appointment, please contact [facility] - A DEPT OF [facility]  Dept: [phone]  and follow the prompts.  Office hours are 8:00 a.m. to 4:30 p.m. Monday - Friday. Please note that voicemails left after 4:00 p.m. may not be returned until the following business day.  We are closed weekends and major holidays. You have access to a nurse at all times for urgent questions. Please call the main number to the clinic Dept: [phone] and follow the prompts.   For any non-urgent questions, you may also contact your provider using MyChart. We now offer e-Visits for anyone 49 and older to request care online for non-urgent symptoms. For details visit [email or web address].   Also download the MyChart app! Go to the app store, search "MyChart", open the app, select [facility], and log in with your MyChart username and password.  Eribulin Injection What is this medication? ERIBULIN (er e bu lin) treats breast cancer. It may also be used to treat sarcoma, a cancer that occurs in bone and connective tissues, such as fat, muscle, and blood vessels. It works by slowing down the growth of cancer cells. This medicine may be used for other purposes; ask your health care provider or pharmacist if you have questions. COMMON BRAND NAME(S): Halaven What should I tell my care team before I take this medication? They need to know if you have any of these conditions: Heart failure Irregular heartbeat  or rhythm Kidney disease Liver disease Low levels of potassium or magnesium in the blood Tingling of the fingers or toes or other nerve disorder An unusual or allergic reaction to eribulin, other medications, foods, dyes, or preservatives Pregnant or trying to get pregnant Breast-feeding How should I use this  medication? This medication is injected into a vein. It is given by your care team in a hospital or clinic setting. Talk to your care team regarding the use of this medication in children. Special care may be needed. Overdosage: If you think you have taken too much of this medicine contact a poison control center or emergency room at once. NOTE: This medicine is only for you. Do not share this medicine with others. What if I miss a dose? Keep appointments for follow-up doses. It is important not to miss your dose. Call your care team if you are unable to keep an appointment. What may interact with this medication? Do not take this medication with any of the following: Cisapride Dronedarone Ketoconazole Levoketoconazole Pimozide Thioridazine This medication may also interact with the following: Other medications that cause heart rhythm changes This list may not describe all possible interactions. Give your health care provider a list of all the medicines, herbs, non-prescription drugs, or dietary supplements you use. Also tell them if you smoke, drink alcohol, or use illegal drugs. Some items may interact with your medicine. What should I watch for while using this medication? This medication may make you feel generally unwell. This is not uncommon, as chemotherapy can affect healthy cells as well as cancer cells. Report any side effects. Continue your course of treatment even though you feel ill unless your care team tells you to stop. This medication may increase your risk of getting an infection. Call your care team for advice if you get a fever, chills, sore throat, or other symptoms of a cold or flu. Do not treat yourself. Try to avoid being around people who are sick. Avoid taking medications that contain aspirin, acetaminophen, ibuprofen, naproxen, or ketoprofen unless instructed by your care team. These medications may hide a fever. Be careful brushing or flossing your teeth or using a  toothpick because you may get an infection or bleed more easily. If you have any dental work done, tell your dentist you are receiving this medication. You may need blood work while you are taking this medicine. Talk to your care team if you wish to become pregnant or think you might be pregnant. This medication can cause serious birth defects. A reliable form of contraception is recommended while taking this medication and for 2 weeks after stopping therapy. Talk to your care team about reliable forms of contraception. Do not father a child while taking this medication and for 3 and 1/2 months (14 weeks) after stopping therapy. Tell your care team right away if you think you or your partner might be pregnant. Do not breast-feed while taking this medication and for 2 weeks after stopping therapy. This medication may cause infertility. Talk to your care team if you are concerned about your fertility. What side effects may I notice from receiving this medication? Side effects that you should report to your care team as soon as possible: Allergic reactions--skin rash, itching, hives, swelling of the face, lips, tongue, or throat Heart rhythm changes--fast or irregular heartbeat, dizziness, feeling faint or lightheaded, chest pain, trouble breathing Infection--fever, chills, cough, sore throat, wounds that don't heal, pain or trouble when passing urine, general feeling of discomfort  or being unwell Pain, tingling, or numbness in the hands or feet Unusual bruising or bleeding Side effects that usually do not require medical attention (report to your care team if they continue or are bothersome): Constipation Fatigue Hair loss Headache Nausea Stomach pain This list may not describe all possible side effects. Call your doctor for medical advice about side effects. You may report side effects to FDA at 1-800-FDA-1088. Where should I keep my medication? This medication is given in a hospital or clinic  and will not be stored at home. NOTE: This sheet is a summary. It may not cover all possible information. If you have questions about this medicine, talk to your doctor, pharmacist, or health care provider.  2024 Elsevier/Gold Standard (2022-03-30 00:00:00)

## 2023-10-16 NOTE — Progress Notes (Signed)
Hypersensitivity Reaction note  Date of event: 10/16/23 Time of event: 1015 Generic name of drug involved: Eribulin mesylate Name of provider notified of the hypersensitivity reaction: Dr. Pamelia Hoit and Daphane Shepherd, PA-C Was agent that likely caused hypersensitivity reaction added to Allergies List within EMR? Yes Chain of events including reaction signs/symptoms, treatment administered, and outcome (e.g., drug resumed; drug discontinued; sent to Emergency Department; etc.) At about 1015 the patient had completed her first time treatment of Halaven with no obvious issues. Upon taking her post VS, she mentioned that she had a little warmth like "contrast from a CT scan" and she had very minimal flushing. Assessment started because there was nothing left to stop- patient had been flushed post infusion at this point. After about 1-2 minutes fluids were started, VS taken and Daphane Shepherd called. Patient was complaining of palpitations. Pepcid 20mg  were given and then 62.5mg  of solu-medrol IV given- see MAR. VS continued to be stable although patient did demonstrate a mild drop. No SOB or any respiratory involvement. The patient did say she continued to have some flushing, so before she was to drive herself home- cetirizine 10mg  IV was given and patient was back to "normal". VS stable and Dr. Pamelia Hoit cleared her to go home. Patient ambulatory to the lobby.  Mickeal Needy, RN 10/16/2023 11:32 AM

## 2023-10-16 NOTE — Progress Notes (Signed)
Patient Care Team: Julieanne Manson, MD as PCP - General (Internal Medicine) Maisie Fus, MD as PCP - Cardiology (Cardiology) Lonie Peak, MD as Attending Physician (Radiation Oncology) Axel Filler, Larna Daughters, NP as Nurse Practitioner (Hematology and Oncology) Berna Bue, MD as Consulting Physician (General Surgery) Felecia Shelling, DPM as Consulting Physician (Podiatry) Graylin Shiver, MD as Consulting Physician (Gastroenterology) Serena Croissant, MD as Attending Physician (Hematology and Oncology)  DIAGNOSIS:  Encounter Diagnoses  Name Primary?   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) Yes   Malignant neoplasm of upper-outer quadrant of right breast in female, estrogen receptor positive (HCC)     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)   06/21/2022 - 08/03/2022 Chemotherapy   Patient is on Treatment Plan : BREAST METASTATIC fam-trastuzumab deruxtecan-nxki (Enhertu) q21d     06/21/2022 - 04/19/2023 Chemotherapy   Patient is on Treatment Plan : BREAST METASTATIC Fam-Trastuzumab Deruxtecan-nxki (Enhertu) (5.4) q21d     05/18/2023 - 07/06/2023 Chemotherapy   Patient is on Treatment Plan : BREAST METASTATIC Sacituzumab govitecan-hziy [name]) D1,8 q21d     10/15/2023 -  Chemotherapy   Patient is on Treatment Plan : BREAST METASTATIC Eribulin D1,8 q21d     Malignant neoplasm of upper-outer quadrant of right breast in female, estrogen receptor positive (HCC)  06/21/2016 Initial Diagnosis   Malignant neoplasm of upper-outer quadrant of right breast in female, estrogen receptor positive (HCC)   06/21/2016 Initial Biopsy   Right breast biopsy, 10 oclock: IDC, grade 3, ER+(95%), PR+(80%),Ki67 20%, HER-2 negative (ratio 0.67). Right axilla core biopsy: carcinoma, grade 3, ER+(100%), PR+(90%), Ki67  25%, HER-2 negative (ratio 1.13).    07/17/2016 - 11/06/2016 Neo-Adjuvant Chemotherapy   Received 2 cycles of Doxorubicin and Cyclophosphamide, then transitioned to weekly Paclitaxel (due to repeated febrile neutropenia) x 7 cycles, stopped early due to peripheral neuropathy, then completed her final 2 cycles of Doxorubicin and Cyclophosphamide.    12/19/2016 Surgery   Right breast lumpectomy ([facility]): IDC, grade 2, 1.6cm and 0.3cm, margins negative, 3 SLN positive for metastatic carcinoma.     12/26/2016 Surgery   ALND: metastatic carcinoma in one of 20 lymph nodes, and three nodes from previous lumpectomy.  Four positive nodes, consistent with pN2a.   02/07/2017 - 03/21/2017 Radiation Therapy   Adjuvant radiation Basilio Cairo): 1) Right breast and nodes - 4 field: 50 Gy in 25 fractions. IM NODES: >95% receive at least 45Gy/49fx. 50Gy to SCLV/PAB @ 2Gy /fraction x 25 fractions. 2) Right breast boost: 10 Gy in 5 fractions   04/2017 -  Anti-estrogen oral therapy   Anastrozole 1mg  daily.  Bone density 03/23/2017 finds T score of -2.6, osteoporosis, plan to start Prolia following dental clearance Anastrozole stopped 01/16/18 Exemestane 25 mg daily 02/12/18  On [name], trial randomized to endocrine therapy alone   05/27/2020 Progression   chest CT scan 05/27/2020 shows bulky mediastinal and right hilar lymphadenopathy with right pleural nodules and a small right pleural effusion, no evidence of liver or bone involvement             (a) biopsy of right breast mass 06/02/2020 shows invasive ductal carcinoma, estrogen and progesterone receptor positive, HER-2 not amplified, with an MIB-1 of 40%   06/17/2020 Treatment Plan Change    fulvestrant to start 06/17/2020             (  a) palbociclib to start 06/17/2020 at 125 mg daily 21 days on 7 days off             (b) palbociclib decreased to 125mg  every other day x 11 doses on 07/23/2020             (c) palbociclib dose decreased to100 mg daily, 21/7, starting with  September cycle             (d) Palbociclib decreased to 75mg  daily beginning with March cycle due to oral ulcers             (e) palbociclib discontinued November 2022 with progression   09/14/2021 PET scan   PET scan 09/14/2021 shows progression in liver and bone   10/05/2021 Pathology Results   liver biopsy 10/05/2021 confirms metastatic carcinoma, estrogen receptor strongly positive, progesterone receptor and HER2 negative, with an MIB-1 of 30%.   10/2021 Treatment Plan Change   fulvestrant continued, Xgeva added, palbociclib changed to abemaciclib November 2022 -Dose Decreased to 50mg  PO BID Daily   03/07/2022 Miscellaneous   [facility] molecular testing: ER positive, ER positive, MTAP: Detected, ESR 1 negative, BRCA 1 and 2 negative, MSI stable, PD-L1 negative, PIK 3 CA negative, PR negative,   06/21/2022 - 08/03/2022 Chemotherapy   Patient is on Treatment Plan : BREAST METASTATIC fam-trastuzumab deruxtecan-nxki (Enhertu) q21d     06/21/2022 - 04/19/2023 Chemotherapy   Patient is on Treatment Plan : BREAST METASTATIC Fam-Trastuzumab Deruxtecan-nxki (Enhertu) (5.4) q21d     01/25/2023 Cancer Staging   Staging form: Breast, AJCC 7th Edition - Pathologic: Stage IV (M1) - Signed by Loa Socks, NP on 01/25/2023   05/18/2023 - 07/06/2023 Chemotherapy   Patient is on Treatment Plan : BREAST METASTATIC Sacituzumab govitecan-hziy [name]) D1,8 q21d     07/23/2023 -  Chemotherapy   Xeloda 1000 mg bid 14 days on and 7 days off   08/27/2023 Procedure   Y90 hepatic radio embolization   10/15/2023 -  Chemotherapy   Patient is on Treatment Plan : BREAST METASTATIC Eribulin D1,8 q21d     Breast cancer, stage 4 (HCC)  03/09/2022 Initial Diagnosis   Breast cancer, stage 4 (HCC)   06/21/2022 - 08/03/2022 Chemotherapy   Patient is on Treatment Plan : BREAST METASTATIC fam-trastuzumab deruxtecan-nxki (Enhertu) q21d     06/21/2022 - 04/19/2023 Chemotherapy   Patient is on Treatment Plan :  BREAST METASTATIC Fam-Trastuzumab Deruxtecan-nxki (Enhertu) (5.4) q21d     05/18/2023 - 07/06/2023 Chemotherapy   Patient is on Treatment Plan : BREAST METASTATIC Sacituzumab govitecan-hziy [name]) D1,8 q21d     10/15/2023 -  Chemotherapy   Patient is on Treatment Plan : BREAST METASTATIC Eribulin D1,8 q21d       CHIEF COMPLIANT: Cycle 1 Halaven  HISTORY OF PRESENT ILLNESS: Discussed the use of AI scribe software for clinical note transcription with the patient, who gave verbal consent to proceed.  History of Present Illness   The patient, with a history of met breast cancer with liver mets, presents one week after a second Y90 procedure and to receive her first cycle of Halaven. She reports abdominal discomfort, localized to the stomach, which she describes as a sensation of fullness after eating only a few bites. This discomfort is not at the procedure site. She also reports constipation, which she believes contributes to the sensation of fullness. She has been taking Docolax and plans to use magnesium citrate, which she found helpful in the past. She denies any other post-procedure complications  and reports that the procedure itself was 'a piece of cake.'  In addition to the liver cancer, the patient has a newborn grandchild, which may be a source of stress and joy.         ALLERGIES:  is allergic to codeine, cymbalta [duloxetine hcl], hydrocodone, ultram [tramadol hcl], venlafaxine, and gabapentin.  MEDICATIONS:  Current Outpatient Medications  Medication Sig Dispense Refill   carvedilol (COREG) 3.125 MG tablet Take 1 tablet (3.125 mg total) by mouth 2 (two) times daily. 180 tablet 3   Cetirizine HCl (ZYRTEC ALLERGY PO) Take 10 mg by mouth daily.     Denosumab (XGEVA [place]) Inject 120 mg into the skin every 3 (three) months.     lidocaine-prilocaine (EMLA) cream Apply to affected area once 30 g 3   naproxen sodium (ALEVE) 220 MG tablet Take 440-660 mg by mouth 2 (two) times daily as  needed (pain).     omeprazole (PRILOSEC) 20 MG capsule Take 20 mg by mouth daily.     ondansetron (ZOFRAN) 8 MG tablet as needed for nausea or vomiting.     ondansetron (ZOFRAN) 8 MG tablet Take 1 tablet (8 mg total) by mouth every 8 (eight) hours as needed for nausea or vomiting. 30 tablet 1   prochlorperazine (COMPAZINE) 10 MG tablet Take 1 tablet (10 mg total) by mouth every 6 (six) hours as needed for nausea or vomiting. 30 tablet 1   rizatriptan (MAXALT) 5 MG tablet Take 1 tablet (5 mg total) by mouth as needed for migraine. May repeat in 2 hours if needed 10 tablet 0   valACYclovir (VALTREX) 1000 MG tablet Take 1 tablet (1,000 mg total) by mouth 2 (two) times daily. 1/2 tab BID (Patient taking differently: Take 500 mg by mouth See admin instructions. Take 500 mg daily, may take a second 500 mg dose as needed for outbreaks) 180 tablet 4   No current facility-administered medications for this visit.    PHYSICAL EXAMINATION: ECOG PERFORMANCE STATUS: 1 - Symptomatic but completely ambulatory  Vitals:   10/16/23 0821  BP: (!) 149/93  Pulse: (!) 112  Resp: 18  Temp: (!) 97.3 F (36.3 C)  SpO2: 98%   Filed Weights   10/16/23 0821  Weight: 200 lb 9.6 oz (91 kg)     LABORATORY DATA:  I have reviewed the data as listed    Latest Ref Rng & Units 10/02/2023    9:17 AM 09/13/2023    2:15 PM 08/30/2023   12:47 PM  CMP  Glucose 70 - 99 mg/dL 098  82  119   BUN 8 - 23 mg/dL 9  14  10    Creatinine 0.44 - 1.00 mg/dL 1.47  8.29  5.62   Sodium 135 - 145 mmol/L 139  139  139   Potassium 3.5 - 5.1 mmol/L 3.6  3.5  3.8   Chloride 98 - 111 mmol/L 109  109  105   CO2 22 - 32 mmol/L 24  24  28    Calcium 8.9 - 10.3 mg/dL 9.4  8.5  9.0   Total Protein 6.5 - 8.1 g/dL 6.8  6.5  7.0   Total Bilirubin 0.3 - 1.2 mg/dL 0.5  0.5  0.7   Alkaline Phos 38 - 126 U/L 273  157  95   AST 15 - 41 U/L 36  37  37   ALT 0 - 44 U/L 21  23  26      Lab Results  Component Value Date  WBC 2.7 (L) 10/16/2023    HGB 14.0 10/16/2023   HCT 40.1 10/16/2023   MCV 99.8 10/16/2023   PLT 187 10/16/2023   NEUTROABS 1.9 10/16/2023    ASSESSMENT & PLAN:  Malignant neoplasm of upper-outer quadrant of right breast in female, estrogen receptor positive (HCC) 1.  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 bone metastasis: Currently on Xgeva every 3 months.   CT CAP 02/01/2022: Mild progression of disease with increased number and size of the numerous lung nodules in the right lung.  Persistent lymphadenopathy and right hilar and right paratracheal nodal areas, numerous hypovascular hepatic lesions slightly larger when compared to previous.   03/28/2023: CA 27-29: 84.4 04/19/2023: CA 27-29: 126 06/22/2023: CA 27-29: 204 09/13/2023: CA 27-29: 329   CT CAP 05/07/2023: Interval increase in size of right hepatic metastases 2.7 cm (to be 1.6 cm) and cluster of lesions in the left hepatic lobe are similar, bone metastases stable   Plan: Caris molecular testing: ER positive, ER positive, TMB 5, PD-L1 negative, PIK 3 CA negative  -------------------------------------------------------- Current treatment: Cycle 1 eribulin  ------------------------------------- Assessment and Plan    Post Y90 procedure Reports of abdominal discomfort and early satiety. Suspected constipation contributing to symptoms. -Continue over-the-counter Docolax as needed. -Consider use of over-the-counter Magnesium Citrate for more aggressive bowel regimen.  Chemotherapy Discussed potential side effects of upcoming chemotherapy, including fatigue, constipation, and potential impact on blood counts. -Start chemotherapy as planned. -Monitor blood counts closely, particularly white blood cell count. -If white blood cell count drops below 1.2, consider Neupogen shot or dose adjustment.  Liver function Elevated alkaline  phosphatase, likely due to biliary drainage disturbance from liver tumors. No evidence of significant biliary obstruction as bilirubin levels remain normal. -Monitor liver function tests closely during treatment.  Follow-up Plan to see patient next week to assess response to first chemotherapy treatment.          No orders of the defined types were placed in this encounter.  The patient has a good understanding of the overall plan. she agrees with it. she will call with any problems that may develop before the next visit here. Total time spent: 30 mins including face to face time and time spent for planning, charting and co-ordination of care   Tamsen Meek, MD 10/16/23

## 2023-10-17 ENCOUNTER — Inpatient Hospital Stay: Payer: Medicare Other

## 2023-10-17 LAB — CANCER ANTIGEN 27.29: CA 27.29: 328.4 U/mL — ABNORMAL HIGH (ref 0.0–38.6)

## 2023-10-19 ENCOUNTER — Other Ambulatory Visit: Payer: Self-pay | Admitting: Physician Assistant

## 2023-10-19 LAB — GENECONNECT MOLECULAR SCREEN

## 2023-10-19 LAB — HELIX MOLECULAR SCREEN: Genetic Analysis Overall Interpretation: NEGATIVE

## 2023-10-22 ENCOUNTER — Other Ambulatory Visit: Payer: Self-pay | Admitting: Hematology and Oncology

## 2023-10-23 ENCOUNTER — Inpatient Hospital Stay (HOSPITAL_BASED_OUTPATIENT_CLINIC_OR_DEPARTMENT_OTHER): Payer: Medicare Other | Admitting: Hematology and Oncology

## 2023-10-23 ENCOUNTER — Inpatient Hospital Stay: Payer: Medicare Other

## 2023-10-23 VITALS — BP 108/79 | HR 103 | Temp 98.1°F | Resp 18 | Ht 64.0 in | Wt 204.9 lb

## 2023-10-23 DIAGNOSIS — C50911 Malignant neoplasm of unspecified site of right female breast: Secondary | ICD-10-CM

## 2023-10-23 DIAGNOSIS — Z5111 Encounter for antineoplastic chemotherapy: Secondary | ICD-10-CM | POA: Diagnosis not present

## 2023-10-23 DIAGNOSIS — Z170 Estrogen receptor positive status [ER+]: Secondary | ICD-10-CM

## 2023-10-23 DIAGNOSIS — C50919 Malignant neoplasm of unspecified site of unspecified female breast: Secondary | ICD-10-CM

## 2023-10-23 DIAGNOSIS — C773 Secondary and unspecified malignant neoplasm of axilla and upper limb lymph nodes: Secondary | ICD-10-CM

## 2023-10-23 LAB — CMP (CANCER CENTER ONLY)
ALT: 30 U/L (ref 0–44)
AST: 56 U/L — ABNORMAL HIGH (ref 15–41)
Albumin: 3 g/dL — ABNORMAL LOW (ref 3.5–5.0)
Alkaline Phosphatase: 219 U/L — ABNORMAL HIGH (ref 38–126)
Anion gap: 4 — ABNORMAL LOW (ref 5–15)
BUN: 6 mg/dL — ABNORMAL LOW (ref 8–23)
CO2: 26 mmol/L (ref 22–32)
Calcium: 8.4 mg/dL — ABNORMAL LOW (ref 8.9–10.3)
Chloride: 107 mmol/L (ref 98–111)
Creatinine: 0.55 mg/dL (ref 0.44–1.00)
GFR, Estimated: >60 mL/min (ref >60)
Glucose, Bld: 105 mg/dL — ABNORMAL HIGH (ref 70–99)
Potassium: 2.8 mmol/L — ABNORMAL LOW (ref 3.5–5.1)
Sodium: 137 mmol/L (ref 135–145)
Total Bilirubin: 0.9 mg/dL (ref <1.2)
Total Protein: 6 g/dL — ABNORMAL LOW (ref 6.5–8.1)

## 2023-10-23 LAB — CBC WITH DIFFERENTIAL (CANCER CENTER ONLY)
Abs Immature Granulocytes: 0 10*3/uL (ref 0.00–0.07)
Basophils Absolute: 0 10*3/uL (ref 0.0–0.1)
Basophils Relative: 6 %
Eosinophils Absolute: 0 10*3/uL (ref 0.0–0.5)
Eosinophils Relative: 3 %
HCT: 36 % (ref 36.0–46.0)
Hemoglobin: 12.2 g/dL (ref 12.0–15.0)
Immature Granulocytes: 0 %
Lymphocytes Relative: 54 %
Lymphs Abs: 0.2 10*3/uL — ABNORMAL LOW (ref 0.7–4.0)
MCH: 33.5 pg (ref 26.0–34.0)
MCHC: 33.9 g/dL (ref 30.0–36.0)
MCV: 98.9 fL (ref 80.0–100.0)
Monocytes Absolute: 0.1 10*3/uL (ref 0.1–1.0)
Monocytes Relative: 28 %
Neutro Abs: 0 10*3/uL — CL (ref 1.7–7.7)
Neutrophils Relative %: 9 %
Platelet Count: 87 10*3/uL — ABNORMAL LOW (ref 150–400)
RBC: 3.64 MIL/uL — ABNORMAL LOW (ref 3.87–5.11)
RDW: 16.6 % — ABNORMAL HIGH (ref 11.5–15.5)
WBC Count: 0.3 10*3/uL — CL (ref 4.0–10.5)
nRBC: 0 % (ref 0.0–0.2)

## 2023-10-23 LAB — MAGNESIUM: Magnesium: 1.8 mg/dL (ref 1.7–2.4)

## 2023-10-23 MED ORDER — POTASSIUM CHLORIDE CRYS ER 10 MEQ PO TBCR: 10.0000 meq | EXTENDED_RELEASE_TABLET | Freq: Every day | ORAL | 1 refills | Status: DC

## 2023-10-23 MED ORDER — LEVOFLOXACIN 750 MG PO TABS: 750.0000 mg | ORAL_TABLET | Freq: Every day | ORAL | 0 refills | Status: DC

## 2023-10-23 MED ORDER — SODIUM CHLORIDE 0.9% FLUSH
10.0000 mL | Freq: Once | INTRAVENOUS | Status: AC
  Administered 2023-10-23: 10 mL

## 2023-10-23 MED ORDER — CYCLOBENZAPRINE HCL 5 MG PO TABS: 5.0000 mg | ORAL_TABLET | Freq: Three times a day (TID) | ORAL | 0 refills | Status: DC | PRN

## 2023-10-23 NOTE — Assessment & Plan Note (Signed)
1.  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 bone metastasis: Currently on Xgeva every 3 months.   CT CAP 02/01/2022: Mild progression of disease with increased number and size of the numerous lung nodules in the right lung.  Persistent lymphadenopathy and right hilar and right paratracheal nodal areas, numerous hypovascular hepatic lesions slightly larger when compared to previous.   03/28/2023: CA 27-29: 84.4 04/19/2023: CA 27-29: 126 06/22/2023: CA 27-29: 204 09/13/2023: CA 27-29: 329   CT CAP 05/07/2023: Interval increase in size of right hepatic metastases 2.7 cm (to be 1.6 cm) and cluster of lesions in the left hepatic lobe are similar, bone metastases stable   Plan: Caris molecular testing: ER positive, ER positive, TMB 5, PD-L1 negative, PIK 3 CA negative  -------------------------------------------------------- Current treatment: Cycle 1 day 8 Eribulin Chemo toxicities:  Return to clinic in 2 weeks for cycle 2

## 2023-10-23 NOTE — Progress Notes (Signed)
Patient Care Team: Julieanne Manson, MD as PCP - General (Internal Medicine) Maisie Fus, MD as PCP - Cardiology (Cardiology) Lonie Peak, MD as Attending Physician (Radiation Oncology) Axel Filler, Larna Daughters, NP as Nurse Practitioner (Hematology and Oncology) Berna Bue, MD as Consulting Physician (General Surgery) Felecia Shelling, DPM as Consulting Physician (Podiatry) Graylin Shiver, MD as Consulting Physician (Gastroenterology) Serena Croissant, MD as Attending Physician (Hematology and Oncology)  DIAGNOSIS:  Encounter Diagnosis  Name Primary?   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) Yes    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)   06/21/2022 - 08/03/2022 Chemotherapy   Patient is on Treatment Plan : BREAST METASTATIC fam-trastuzumab deruxtecan-nxki (Enhertu) q21d     06/21/2022 - 04/19/2023 Chemotherapy   Patient is on Treatment Plan : BREAST METASTATIC Fam-Trastuzumab Deruxtecan-nxki (Enhertu) (5.4) q21d     05/18/2023 - 07/06/2023 Chemotherapy   Patient is on Treatment Plan : BREAST METASTATIC Sacituzumab govitecan-hziy [name]) D1,8 q21d     10/16/2023 -  Chemotherapy   Patient is on Treatment Plan : BREAST METASTATIC Eribulin D1,8 q21d     Malignant neoplasm of upper-outer quadrant of right breast in female, estrogen receptor positive (HCC)  06/21/2016 Initial Diagnosis   Malignant neoplasm of upper-outer quadrant of right breast in female, estrogen receptor positive (HCC)   06/21/2016 Initial Biopsy   Right breast biopsy, 10 oclock: IDC, grade 3, ER+(95%), PR+(80%),Ki67 20%, HER-2 negative (ratio 0.67). Right axilla core biopsy: carcinoma, grade 3, ER+(100%), PR+(90%), Ki67 25%, HER-2 negative (ratio 1.13).    07/17/2016 - 11/06/2016 Neo-Adjuvant Chemotherapy   Received 2 cycles of  Doxorubicin and Cyclophosphamide, then transitioned to weekly Paclitaxel (due to repeated febrile neutropenia) x 7 cycles, stopped early due to peripheral neuropathy, then completed her final 2 cycles of Doxorubicin and Cyclophosphamide.    12/19/2016 Surgery   Right breast lumpectomy ([facility]): IDC, grade 2, 1.6cm and 0.3cm, margins negative, 3 SLN positive for metastatic carcinoma.     12/26/2016 Surgery   ALND: metastatic carcinoma in one of 20 lymph nodes, and three nodes from previous lumpectomy.  Four positive nodes, consistent with pN2a.   02/07/2017 - 03/21/2017 Radiation Therapy   Adjuvant radiation Basilio [place]): 1) Right breast and nodes - 4 field: 50 Gy in 25 fractions. IM NODES: >95% receive at least 45Gy/36fx. 50Gy to SCLV/PAB @ 2Gy /fraction x 25 fractions. 2) Right breast boost: 10 Gy in 5 fractions   04/2017 -  Anti-estrogen oral therapy   Anastrozole 1mg  daily.  Bone density 03/23/2017 finds T score of -2.6, osteoporosis, plan to start Prolia following dental clearance Anastrozole stopped 01/16/18 Exemestane 25 mg daily 02/12/18  On [name], trial randomized to endocrine therapy alone   05/27/2020 Progression   chest CT scan 05/27/2020 shows bulky mediastinal and right hilar lymphadenopathy with right pleural nodules and a small right pleural effusion, no evidence of liver or bone involvement             (a) biopsy of right breast mass 06/02/2020 shows invasive ductal carcinoma, estrogen and progesterone receptor positive, HER-2 not amplified, with an MIB-1 of 40%   06/17/2020 Treatment Plan Change    fulvestrant to start 06/17/2020             (a) palbociclib to start 06/17/2020 at 125 mg daily 21 days on 7 days off             (  b) palbociclib decreased to 125mg  every other day x 11 doses on 07/23/2020             (c) palbociclib dose decreased to100 mg daily, 21/7, starting with September cycle             (d) Palbociclib decreased to 75mg  daily beginning with March cycle due to oral  ulcers             (e) palbociclib discontinued November 2022 with progression   09/14/2021 PET scan   PET scan 09/14/2021 shows progression in liver and bone   10/05/2021 Pathology Results   liver biopsy 10/05/2021 confirms metastatic carcinoma, estrogen receptor strongly positive, progesterone receptor and HER2 negative, with an MIB-1 of 30%.   10/2021 Treatment Plan Change   fulvestrant continued, Xgeva added, palbociclib changed to abemaciclib November 2022 -Dose Decreased to 50mg  PO BID Daily   03/07/2022 Miscellaneous   [facility] molecular testing: ER positive, ER positive, MTAP: Detected, ESR 1 negative, BRCA 1 and 2 negative, MSI stable, PD-L1 negative, PIK 3 CA negative, PR negative,   06/21/2022 - 08/03/2022 Chemotherapy   Patient is on Treatment Plan : BREAST METASTATIC fam-trastuzumab deruxtecan-nxki (Enhertu) q21d     06/21/2022 - 04/19/2023 Chemotherapy   Patient is on Treatment Plan : BREAST METASTATIC Fam-Trastuzumab Deruxtecan-nxki (Enhertu) (5.4) q21d     01/25/2023 Cancer Staging   Staging form: Breast, AJCC 7th Edition - Pathologic: Stage IV (M1) - Signed by Loa Socks, NP on 01/25/2023   05/18/2023 - 07/06/2023 Chemotherapy   Patient is on Treatment Plan : BREAST METASTATIC Sacituzumab govitecan-hziy [name]) D1,8 q21d     07/23/2023 -  Chemotherapy   Xeloda 1000 mg bid 14 days on and 7 days off   08/27/2023 Procedure   Y90 hepatic radio embolization   10/16/2023 -  Chemotherapy   Patient is on Treatment Plan : BREAST METASTATIC Eribulin D1,8 q21d     Breast cancer, stage 4 (HCC)  03/09/2022 Initial Diagnosis   Breast cancer, stage 4 (HCC)   06/21/2022 - 08/03/2022 Chemotherapy   Patient is on Treatment Plan : BREAST METASTATIC fam-trastuzumab deruxtecan-nxki (Enhertu) q21d     06/21/2022 - 04/19/2023 Chemotherapy   Patient is on Treatment Plan : BREAST METASTATIC Fam-Trastuzumab Deruxtecan-nxki (Enhertu) (5.4) q21d     05/18/2023 - 07/06/2023 Chemotherapy    Patient is on Treatment Plan : BREAST METASTATIC Sacituzumab govitecan-hziy [name]) D1,8 q21d     10/16/2023 -  Chemotherapy   Patient is on Treatment Plan : BREAST METASTATIC Eribulin D1,8 q21d       CHIEF COMPLIANT: Follow-up of metastatic breast cancer, cycle 1 day 8 eribulin  HISTORY OF PRESENT ILLNESS:   History of Present Illness   The patient, with a history of Met breast cancer, presents with multiple side effects following her recent chemotherapy treatment. She reports oral discomfort, describing it as if she had 'drank scalding water,' with redness and soreness in the mouth and on the sides of the lips. She has been managing the discomfort with baking soda or salt water gargles and Maalox for coating and cooling.  The patient also reports a persistent cough, which she attributes to allergies and post-nasal drip. She has been wearing a mask due to concerns about potential infection. She also mentions itching in the ears.  The patient has noticed her urine is orange, which she initially attributed to dehydration despite drinking plenty of water. She also reports muscle cramping in the shoulders and hips, which becomes particularly pronounced  after periods of activity, such as walking through a parking lot or standing in front of the stove to cook.  The patient denies diarrhea and vomiting but admits to constipation and occasional headaches. She describes feeling more fatigued than usual, even compared to previous chemotherapy treatments.  The patient also mentions a history of back pain, which she has managed with exercises and stretching. She has previously taken Flexeril for this issue.         ALLERGIES:  is allergic to eribulin mesylate, codeine, cymbalta [duloxetine hcl], hydrocodone, ultram [tramadol hcl], venlafaxine, and gabapentin.  MEDICATIONS:  Current Outpatient Medications  Medication Sig Dispense Refill   cyclobenzaprine (FLEXERIL) 5 MG tablet Take 1 tablet (5  mg total) by mouth 3 (three) times daily as needed for muscle spasms. 30 tablet 0   levofloxacin (LEVAQUIN) 750 MG tablet Take 1 tablet (750 mg total) by mouth daily. 7 tablet 0   carvedilol (COREG) 3.125 MG tablet Take 1 tablet (3.125 mg total) by mouth 2 (two) times daily. 180 tablet 3   Cetirizine HCl (ZYRTEC ALLERGY PO) Take 10 mg by mouth daily.     Denosumab (XGEVA Stewart) Inject 120 mg into the skin every 3 (three) months.     lidocaine-prilocaine (EMLA) cream Apply to affected area once 30 g 3   naproxen sodium (ALEVE) 220 MG tablet Take 440-660 mg by mouth 2 (two) times daily as needed (pain).     omeprazole (PRILOSEC) 20 MG capsule Take 20 mg by mouth daily.     ondansetron (ZOFRAN) 8 MG tablet as needed for nausea or vomiting.     ondansetron (ZOFRAN) 8 MG tablet Take 1 tablet (8 mg total) by mouth every 8 (eight) hours as needed for nausea or vomiting. 30 tablet 1   prochlorperazine (COMPAZINE) 10 MG tablet Take 1 tablet (10 mg total) by mouth every 6 (six) hours as needed for nausea or vomiting. 30 tablet 1   rizatriptan (MAXALT) 5 MG tablet Take 1 tablet (5 mg total) by mouth as needed for migraine. May repeat in 2 hours if needed 10 tablet 0   valACYclovir (VALTREX) 1000 MG tablet Take 1 tablet (1,000 mg total) by mouth 2 (two) times daily. 1/2 tab BID (Patient taking differently: Take 500 mg by mouth See admin instructions. Take 500 mg daily, may take a second 500 mg dose as needed for outbreaks) 180 tablet 4   No current facility-administered medications for this visit.    PHYSICAL EXAMINATION: ECOG PERFORMANCE STATUS: 1 - Symptomatic but completely ambulatory  Vitals:   10/23/23 1414  BP: 108/79  Pulse: (!) 103  Resp: 18  Temp: 98.1 F (36.7 C)  SpO2: 100%   Filed Weights   10/23/23 1414  Weight: 204 lb 14.4 oz (92.9 kg)     LABORATORY DATA:  I have reviewed the data as listed    Latest Ref Rng & Units 10/23/2023    1:50 PM 10/16/2023    8:04 AM 10/02/2023     9:17 AM  CMP  Glucose 70 - 99 mg/dL 086  578  469   BUN 8 - 23 mg/dL 6  12  9    Creatinine 0.44 - 1.00 mg/dL 6.29  5.28  4.13   Sodium 135 - 145 mmol/L 137  139  139   Potassium 3.5 - 5.1 mmol/L 2.8  3.3  3.6   Chloride 98 - 111 mmol/L 107  106  109   CO2 22 - 32 mmol/L 26  25  24   Calcium 8.9 - 10.3 mg/dL 8.4  8.9  9.4   Total Protein 6.5 - 8.1 g/dL 6.0  6.9  6.8   Total Bilirubin <1.2 mg/dL 0.9  0.9  0.5   Alkaline Phos 38 - 126 U/L 219  300  273   AST 15 - 41 U/L 56  52  36   ALT 0 - 44 U/L 30  27  21      Lab Results  Component Value Date   WBC 0.3 (LL) 10/23/2023   HGB 12.2 10/23/2023   HCT 36.0 10/23/2023   MCV 98.9 10/23/2023   PLT 87 (L) 10/23/2023   NEUTROABS 0.0 (LL) 10/23/2023    ASSESSMENT & PLAN:  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) 1.  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 bone metastasis: Currently on Xgeva every 3 months.   CT CAP 02/01/2022: Mild progression of disease with increased number and size of the numerous lung nodules in the right lung.  Persistent lymphadenopathy and right hilar and right paratracheal nodal areas, numerous hypovascular hepatic lesions slightly larger when compared to previous.   03/28/2023: CA 27-29: 84.4 04/19/2023: CA 27-29: 126 06/22/2023: CA 27-29: 204 09/13/2023: CA 27-29: 329   CT CAP 05/07/2023: Interval increase in size of right hepatic metastases 2.7 cm (to be 1.6 cm) and cluster of lesions in the left hepatic lobe are similar, bone metastases stable   Plan: [facility] molecular testing: ER positive, ER positive, TMB 5, PD-L1 negative, PIK 3 CA negative  -------------------------------------------------------- Current treatment: Cycle 1 day 8 Eribulin Chemo toxicities: Profound neutropenia: ANC 0, canceled today's chemo.  She will get the Neupogen injections on Saturdays prior to  chemo. Mouth sores: Encouraged the patient to use salt water gargles.  She did not want Magic mouthwash. Thrombocytopenia: Watching and monitoring Hypokalemia: Send potassium 10 mEq daily. Muscle spasms: Could be related to hypokalemia.  I sent a prescription for Flexeril because the pain can be excruciating.  Return to clinic in 2 weeks for cycle 2 at a reduced dosage. ------------------------------------- Assessment and Plan    Chemotherapy-induced mucositis Severe oral discomfort and redness. Discussed options for symptomatic relief including magic mouthwash, baking soda or salt water gargles, and Maalox. -Recommend baking soda or salt water gargles. -Consider use of Maalox for coating and cooling effect.  Chemotherapy-induced leukopenia Severe leukopenia with white blood cell count at 2.7, one of the lowest it's been. Discussed the risk of infection due to lack of immune system. -Postpone chemotherapy treatment for two weeks to allow for recovery. -Start Granix injections two days before each subsequent treatment to boost white blood cell count.  Chemotherapy-induced myalgia Severe muscle pain in shoulders and hips, limiting daily activities. Discussed the use of muscle relaxants for relief. -Prescribe Flexeril for seven days.  Possible infection Coughing, itchy ears, and allergy-like symptoms. Discussed the need for antibiotics due to lack of immune system. -Prescribe Levofloxacin for one week.  Chemotherapy-induced change in urine color Noted orange urine, likely due to breakdown products of chemotherapy. -Explain that this is a normal side effect of chemotherapy and not indicative of infection.  Constipation No diarrhea, but still on the constipated side. -Continue current regimen for constipation.  Follow-up in two weeks for the start of the second cycle of chemotherapy. Check electrolytes and magnesium levels and call patient with results and any necessary replacement  therapy.  No orders of the defined types were placed in this encounter.  The patient has a good understanding of the overall plan. she agrees with it. she will call with any problems that may develop before the next visit here. Total time spent: 30 mins including face to face time and time spent for planning, charting and co-ordination of care   Tamsen Meek, MD 10/23/23

## 2023-10-24 ENCOUNTER — Encounter: Payer: Self-pay | Admitting: Hematology and Oncology

## 2023-10-24 LAB — CANCER ANTIGEN 27.29: CA 27.29: 259.2 U/mL — ABNORMAL HIGH (ref 0.0–38.6)

## 2023-10-30 ENCOUNTER — Other Ambulatory Visit: Payer: Self-pay

## 2023-10-31 ENCOUNTER — Other Ambulatory Visit: Payer: Medicaid Other

## 2023-10-31 ENCOUNTER — Ambulatory Visit: Payer: Medicaid Other | Admitting: Hematology and Oncology

## 2023-10-31 ENCOUNTER — Ambulatory Visit: Payer: Medicaid Other

## 2023-11-02 NOTE — Addendum Note (Signed)
Encounter addended by: Dagoberto Reef on: 11/02/2023 11:47 AM  Actions taken: Imaging Exam begun

## 2023-11-02 NOTE — Addendum Note (Signed)
Encounter addended by: Dagoberto Reef on: 11/02/2023 11:48 AM  Actions taken: Imaging Exam begun

## 2023-11-03 ENCOUNTER — Inpatient Hospital Stay: Payer: Medicaid Other

## 2023-11-03 VITALS — BP 101/75 | HR 103 | Temp 97.0°F | Resp 20

## 2023-11-03 DIAGNOSIS — C50919 Malignant neoplasm of unspecified site of unspecified female breast: Secondary | ICD-10-CM

## 2023-11-03 DIAGNOSIS — C50911 Malignant neoplasm of unspecified site of right female breast: Secondary | ICD-10-CM

## 2023-11-03 DIAGNOSIS — Z5111 Encounter for antineoplastic chemotherapy: Secondary | ICD-10-CM | POA: Diagnosis not present

## 2023-11-03 DIAGNOSIS — C50411 Malignant neoplasm of upper-outer quadrant of right female breast: Secondary | ICD-10-CM

## 2023-11-03 MED ORDER — FILGRASTIM-SNDZ 300 MCG/0.5ML IJ SOSY
300.0000 ug | PREFILLED_SYRINGE | Freq: Once | INTRAMUSCULAR | Status: AC
  Administered 2023-11-03: 300 ug via SUBCUTANEOUS
  Filled 2023-11-03: qty 0.5

## 2023-11-03 NOTE — Patient Instructions (Signed)
Filgrastim Injection What is this medication? FILGRASTIM (fil GRA stim) lowers the risk of infection in people who are receiving chemotherapy. It works by helping your body make more white blood cells, which protects your body from infection. It may also be used to help people who have been exposed to high doses of radiation. It can be used to help prepare your body before a stem cell transplant. It works by helping your bone marrow make and release stem cells into the blood. This medicine may be used for other purposes; ask your health care provider or pharmacist if you have questions. COMMON BRAND NAME(S): Neupogen, Nivestym, Releuko, Zarxio What should I tell my care team before I take this medication? They need to know if you have any of these conditions: History of blood diseases, such as sickle cell anemia Kidney disease Recent or ongoing radiation An unusual or allergic reaction to filgrastim, pegfilgrastim, latex, rubber, other medications, foods, dyes, or preservatives Pregnant or trying to get pregnant Breast-feeding How should I use this medication? This medication is injected under the skin or into a vein. It is usually given by your care team in a hospital or clinic setting. It may be given at home. If you get this medication at home, you will be taught how to prepare and give it. Use exactly as directed. Take it as directed on the prescription label at the same time every day. Keep taking it unless your care team tells you to stop. It is important that you put your used needles and syringes in a special sharps container. Do not put them in a trash can. If you do not have a sharps container, call your pharmacist or care team to get one. This medication comes with INSTRUCTIONS FOR USE. Ask your pharmacist for directions on how to use this medication. Read the information carefully. Talk to your pharmacist or care team if you have questions. Talk to your care team about the use of this  medication in children. While it may be prescribed for children for selected conditions, precautions do apply. Overdosage: If you think you have taken too much of this medicine contact a poison control center or emergency room at once. NOTE: This medicine is only for you. Do not share this medicine with others. What if I miss a dose? It is important not to miss any doses. Talk to your care team about what to do if you miss a dose. What may interact with this medication? Medications that may cause a release of neutrophils, such as lithium This list may not describe all possible interactions. Give your health care provider a list of all the medicines, herbs, non-prescription drugs, or dietary supplements you use. Also tell them if you smoke, drink alcohol, or use illegal drugs. Some items may interact with your medicine. What should I watch for while using this medication? Your condition will be monitored carefully while you are receiving this medication. You may need bloodwork while taking this medication. Talk to your care team about your risk of cancer. You may be more at risk for certain types of cancer if you take this medication. What side effects may I notice from receiving this medication? Side effects that you should report to your care team as soon as possible: Allergic reactions--skin rash, itching, hives, swelling of the face, lips, tongue, or throat Capillary leak syndrome--stomach or muscle pain, unusual weakness or fatigue, feeling faint or lightheaded, decrease in the amount of urine, swelling of the ankles, hands, or   feet, trouble breathing High white blood cell level--fever, fatigue, trouble breathing, night sweats, change in vision, weight loss Inflammation of the aorta--fever, fatigue, back, chest, or stomach pain, severe headache Kidney injury (glomerulonephritis)--decrease in the amount of urine, red or dark brown urine, foamy or bubbly urine, swelling of the ankles, hands, or  feet Shortness of breath or trouble breathing Spleen injury--pain in upper left stomach or shoulder Unusual bruising or bleeding Side effects that usually do not require medical attention (report to your care team if they continue or are bothersome): Back pain Bone pain Fatigue Fever Headache Nausea This list may not describe all possible side effects. Call your doctor for medical advice about side effects. You may report side effects to FDA at 1-800-FDA-1088. Where should I keep my medication? Keep out of the reach of children and pets. Keep this medication in the original packaging until you are ready to take it. Protect from light. See product for storage information. Each product may have different instructions. Get rid of any unused medication after the expiration date. To get rid of medications that are no longer needed or have expired: Take the medication to a medications take-back program. Check with your pharmacy or law enforcement to find a location. If you cannot return the medication, ask your pharmacist or care team how to get rid of this medication safely. NOTE: This sheet is a summary. It may not cover all possible information. If you have questions about this medicine, talk to your doctor, pharmacist, or health care provider.  2024 Elsevier/Gold Standard (2022-04-13 00:00:00)  

## 2023-11-05 ENCOUNTER — Encounter: Payer: Self-pay | Admitting: Hematology and Oncology

## 2023-11-05 NOTE — Assessment & Plan Note (Signed)
1.  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 bone metastasis: Currently on Xgeva every 3 months.   CT CAP 02/01/2022: Mild progression of disease with increased number and size of the numerous lung nodules in the right lung.  Persistent lymphadenopathy and right hilar and right paratracheal nodal areas, numerous hypovascular hepatic lesions slightly larger when compared to previous.   03/28/2023: CA 27-29: 84.4 04/19/2023: CA 27-29: 126 06/22/2023: CA 27-29: 204 09/13/2023: CA 27-29: 329   CT CAP 05/07/2023: Interval increase in size of right hepatic metastases 2.7 cm (to be 1.6 cm) and cluster of lesions in the left hepatic lobe are similar, bone metastases stable   Plan: [facility] molecular testing: ER positive, ER positive, TMB 5, PD-L1 negative, PIK 3 CA negative  -------------------------------------------------------- Current treatment: Cycle 2 day 1 Eribulin Chemo toxicities: Profound neutropenia: ANC 0, canceled today's chemo.  She will get the Neupogen injections on Saturdays prior to chemo. Mouth sores: Encouraged the patient to use salt water gargles.  She did not want Magic mouthwash. Thrombocytopenia: Watching and monitoring Hypokalemia: Send potassium 10 mEq daily. Muscle spasms: Could be related to hypokalemia.  I sent a prescription for Flexeril because the pain can be excruciating.   Return to clinic in 3 weeks for cycle 3 at a reduced dosage.

## 2023-11-06 ENCOUNTER — Inpatient Hospital Stay: Payer: Medicaid Other

## 2023-11-06 ENCOUNTER — Inpatient Hospital Stay (HOSPITAL_BASED_OUTPATIENT_CLINIC_OR_DEPARTMENT_OTHER): Payer: Medicaid Other | Admitting: Hematology and Oncology

## 2023-11-06 ENCOUNTER — Inpatient Hospital Stay: Payer: Medicaid Other | Attending: Oncology

## 2023-11-06 VITALS — BP 121/75 | HR 86 | Resp 18

## 2023-11-06 VITALS — BP 113/63 | HR 98 | Temp 98.1°F | Resp 18 | Ht 64.0 in | Wt 212.3 lb

## 2023-11-06 DIAGNOSIS — C773 Secondary and unspecified malignant neoplasm of axilla and upper limb lymph nodes: Secondary | ICD-10-CM | POA: Insufficient documentation

## 2023-11-06 DIAGNOSIS — C787 Secondary malignant neoplasm of liver and intrahepatic bile duct: Secondary | ICD-10-CM | POA: Diagnosis not present

## 2023-11-06 DIAGNOSIS — C50411 Malignant neoplasm of upper-outer quadrant of right female breast: Secondary | ICD-10-CM

## 2023-11-06 DIAGNOSIS — D696 Thrombocytopenia, unspecified: Secondary | ICD-10-CM | POA: Insufficient documentation

## 2023-11-06 DIAGNOSIS — C7951 Secondary malignant neoplasm of bone: Secondary | ICD-10-CM | POA: Insufficient documentation

## 2023-11-06 DIAGNOSIS — C50919 Malignant neoplasm of unspecified site of unspecified female breast: Secondary | ICD-10-CM

## 2023-11-06 DIAGNOSIS — Z170 Estrogen receptor positive status [ER+]: Secondary | ICD-10-CM | POA: Diagnosis not present

## 2023-11-06 DIAGNOSIS — E876 Hypokalemia: Secondary | ICD-10-CM | POA: Insufficient documentation

## 2023-11-06 DIAGNOSIS — K1379 Other lesions of oral mucosa: Secondary | ICD-10-CM | POA: Diagnosis not present

## 2023-11-06 DIAGNOSIS — D709 Neutropenia, unspecified: Secondary | ICD-10-CM | POA: Diagnosis not present

## 2023-11-06 DIAGNOSIS — Z923 Personal history of irradiation: Secondary | ICD-10-CM | POA: Diagnosis not present

## 2023-11-06 DIAGNOSIS — Z5111 Encounter for antineoplastic chemotherapy: Secondary | ICD-10-CM | POA: Diagnosis present

## 2023-11-06 DIAGNOSIS — C50911 Malignant neoplasm of unspecified site of right female breast: Secondary | ICD-10-CM

## 2023-11-06 DIAGNOSIS — R5383 Other fatigue: Secondary | ICD-10-CM | POA: Diagnosis not present

## 2023-11-06 DIAGNOSIS — Z79899 Other long term (current) drug therapy: Secondary | ICD-10-CM | POA: Diagnosis not present

## 2023-11-06 DIAGNOSIS — M62838 Other muscle spasm: Secondary | ICD-10-CM | POA: Insufficient documentation

## 2023-11-06 DIAGNOSIS — Z79811 Long term (current) use of aromatase inhibitors: Secondary | ICD-10-CM | POA: Insufficient documentation

## 2023-11-06 DIAGNOSIS — Z79624 Long term (current) use of inhibitors of nucleotide synthesis: Secondary | ICD-10-CM | POA: Diagnosis not present

## 2023-11-06 LAB — CMP (CANCER CENTER ONLY)
ALT: 21 U/L (ref 0–44)
AST: 53 U/L — ABNORMAL HIGH (ref 15–41)
Albumin: 2.8 g/dL — ABNORMAL LOW (ref 3.5–5.0)
Alkaline Phosphatase: 302 U/L — ABNORMAL HIGH (ref 38–126)
Anion gap: 4 — ABNORMAL LOW (ref 5–15)
BUN: 9 mg/dL (ref 8–23)
CO2: 25 mmol/L (ref 22–32)
Calcium: 8.1 mg/dL — ABNORMAL LOW (ref 8.9–10.3)
Chloride: 110 mmol/L (ref 98–111)
Creatinine: 0.52 mg/dL (ref 0.44–1.00)
GFR, Estimated: >60 mL/min (ref >60)
Glucose, Bld: 123 mg/dL — ABNORMAL HIGH (ref 70–99)
Potassium: 3.5 mmol/L (ref 3.5–5.1)
Sodium: 139 mmol/L (ref 135–145)
Total Bilirubin: 1.4 mg/dL — ABNORMAL HIGH (ref <1.2)
Total Protein: 5.6 g/dL — ABNORMAL LOW (ref 6.5–8.1)

## 2023-11-06 LAB — CBC WITH DIFFERENTIAL (CANCER CENTER ONLY)
Abs Immature Granulocytes: 0.01 10*3/uL (ref 0.00–0.07)
Basophils Absolute: 0 10*3/uL (ref 0.0–0.1)
Basophils Relative: 1 %
Eosinophils Absolute: 0 10*3/uL (ref 0.0–0.5)
Eosinophils Relative: 0 %
HCT: 37.1 % (ref 36.0–46.0)
Hemoglobin: 12.5 g/dL (ref 12.0–15.0)
Immature Granulocytes: 0 %
Lymphocytes Relative: 7 %
Lymphs Abs: 0.3 10*3/uL — ABNORMAL LOW (ref 0.7–4.0)
MCH: 33.7 pg (ref 26.0–34.0)
MCHC: 33.7 g/dL (ref 30.0–36.0)
MCV: 100 fL (ref 80.0–100.0)
Monocytes Absolute: 0.5 10*3/uL (ref 0.1–1.0)
Monocytes Relative: 9 %
Neutro Abs: 4.4 10*3/uL (ref 1.7–7.7)
Neutrophils Relative %: 83 %
Platelet Count: 142 10*3/uL — ABNORMAL LOW (ref 150–400)
RBC: 3.71 MIL/uL — ABNORMAL LOW (ref 3.87–5.11)
RDW: 19.5 % — ABNORMAL HIGH (ref 11.5–15.5)
WBC Count: 5.2 10*3/uL (ref 4.0–10.5)
nRBC: 0 % (ref 0.0–0.2)

## 2023-11-06 LAB — MAGNESIUM: Magnesium: 2 mg/dL (ref 1.7–2.4)

## 2023-11-06 MED ORDER — FAMOTIDINE IN NACL 20-0.9 MG/50ML-% IV SOLN
20.0000 mg | Freq: Once | INTRAVENOUS | Status: AC
Start: 2023-11-06 — End: 2023-11-06
  Administered 2023-11-06: 20 mg via INTRAVENOUS
  Filled 2023-11-06: qty 50

## 2023-11-06 MED ORDER — PROCHLORPERAZINE MALEATE 10 MG PO TABS
10.0000 mg | ORAL_TABLET | Freq: Once | ORAL | Status: AC
  Administered 2023-11-06: 10 mg via ORAL
  Filled 2023-11-06: qty 1

## 2023-11-06 MED ORDER — HEPARIN SOD (PORK) LOCK FLUSH 100 UNIT/ML IV SOLN
500.0000 [IU] | Freq: Once | INTRAVENOUS | Status: AC | PRN
  Administered 2023-11-06: 500 [IU]

## 2023-11-06 MED ORDER — SODIUM CHLORIDE 0.9% FLUSH
10.0000 mL | Freq: Once | INTRAVENOUS | Status: AC
  Administered 2023-11-06: 10 mL

## 2023-11-06 MED ORDER — METHYLPREDNISOLONE SODIUM SUCC 125 MG IJ SOLR
60.0000 mg | Freq: Once | INTRAMUSCULAR | Status: AC
  Administered 2023-11-06: 60 mg via INTRAVENOUS
  Filled 2023-11-06: qty 2

## 2023-11-06 MED ORDER — CETIRIZINE HCL 10 MG/ML IV SOLN
10.0000 mg | Freq: Once | INTRAVENOUS | Status: AC
Start: 2023-11-06 — End: 2023-11-06
  Administered 2023-11-06: 10 mg via INTRAVENOUS
  Filled 2023-11-06: qty 1

## 2023-11-06 MED ORDER — SODIUM CHLORIDE 0.9 % IV SOLN
1.0000 mg/m2 | Freq: Once | INTRAVENOUS | Status: AC
  Administered 2023-11-06: 2 mg via INTRAVENOUS
  Filled 2023-11-06: qty 4

## 2023-11-06 MED ORDER — SODIUM CHLORIDE 0.9 % IV SOLN
INTRAVENOUS | Status: DC
Start: 2023-11-06 — End: 2023-11-06

## 2023-11-06 MED ORDER — SODIUM CHLORIDE 0.9% FLUSH
10.0000 mL | INTRAVENOUS | Status: DC | PRN
  Administered 2023-11-06: 10 mL

## 2023-11-06 NOTE — Patient Instructions (Signed)
[facility] - A DEPT OF [facility]  Discharge Instructions: Thank you for choosing [facility] to provide your oncology and hematology care.   If you have a lab appointment with the Cancer Center, please go directly to the Cancer Center and check in at the registration area.   Wear comfortable clothing and clothing appropriate for easy access to any Portacath or PICC line.   We strive to give you quality time with your provider. You may need to reschedule your appointment if you arrive late (15 or more minutes).  Arriving late affects you and other patients whose appointments are after yours.  Also, if you miss three or more appointments without notifying the office, you may be dismissed from the clinic at the provider's discretion.      For prescription refill requests, have your pharmacy contact our office and allow 72 hours for refills to be completed.    Today you received the following chemotherapy and/or immunotherapy agents: Eribulin (Halaven)   To help prevent nausea and vomiting after your treatment, we encourage you to take your nausea medication as directed.  BELOW ARE SYMPTOMS THAT SHOULD BE REPORTED IMMEDIATELY: *FEVER GREATER THAN 100.4 F (38 C) OR HIGHER *CHILLS OR SWEATING *NAUSEA AND VOMITING THAT IS NOT CONTROLLED WITH YOUR NAUSEA MEDICATION *UNUSUAL SHORTNESS OF BREATH *UNUSUAL BRUISING OR BLEEDING *URINARY PROBLEMS (pain or burning when urinating, or frequent urination) *BOWEL PROBLEMS (unusual diarrhea, constipation, pain near the anus) TENDERNESS IN MOUTH AND THROAT WITH OR WITHOUT PRESENCE OF ULCERS (sore throat, sores in mouth, or a toothache) UNUSUAL RASH, SWELLING OR PAIN  UNUSUAL VAGINAL DISCHARGE OR ITCHING   Items with * indicate a potential emergency and should be followed up as soon as possible or go to the Emergency Department if any problems should occur.  Please show the CHEMOTHERAPY ALERT CARD or  IMMUNOTHERAPY ALERT CARD at check-in to the Emergency Department and triage nurse.  Should you have questions after your visit or need to cancel or reschedule your appointment, please contact [facility] - A DEPT OF [facility]  Dept: [phone]  and follow the prompts.  Office hours are 8:00 a.m. to 4:30 p.m. Monday - Friday. Please note that voicemails left after 4:00 p.m. may not be returned until the following business day.  We are closed weekends and major holidays. You have access to a nurse at all times for urgent questions. Please call the main number to the clinic Dept: [phone] and follow the prompts.   For any non-urgent questions, you may also contact your provider using MyChart. We now offer e-Visits for anyone 34 and older to request care online for non-urgent symptoms. For details visit [email or web address].   Also download the MyChart app! Go to the app store, search "MyChart", open the app, select [place], and log in with your MyChart username and password.  Eribulin Injection What is this medication? ERIBULIN (er e bu lin) treats breast cancer. It may also be used to treat sarcoma, a cancer that occurs in bone and connective tissues, such as fat, muscle, and blood vessels. It works by slowing down the growth of cancer cells. This medicine may be used for other purposes; ask your health care provider or pharmacist if you have questions. COMMON BRAND NAME(S): Halaven What should I tell my care team before I take this medication? They need to know if you have any of these conditions:  Heart failure Irregular heartbeat or rhythm Kidney disease Liver disease Low levels of potassium or magnesium in the blood Tingling of the fingers or toes or other nerve disorder An unusual or allergic reaction to eribulin, other medications, foods, dyes, or preservatives Pregnant or trying to get pregnant Breast-feeding How should I use this  medication? This medication is injected into a vein. It is given by your care team in a hospital or clinic setting. Talk to your care team regarding the use of this medication in children. Special care may be needed. Overdosage: If you think you have taken too much of this medicine contact a poison control center or emergency room at once. NOTE: This medicine is only for you. Do not share this medicine with others. What if I miss a dose? Keep appointments for follow-up doses. It is important not to miss your dose. Call your care team if you are unable to keep an appointment. What may interact with this medication? Do not take this medication with any of the following: Cisapride Dronedarone Ketoconazole Levoketoconazole Pimozide Thioridazine This medication may also interact with the following: Other medications that cause heart rhythm changes This list may not describe all possible interactions. Give your health care provider a list of all the medicines, herbs, non-prescription drugs, or dietary supplements you use. Also tell them if you smoke, drink alcohol, or use illegal drugs. Some items may interact with your medicine. What should I watch for while using this medication? This medication may make you feel generally unwell. This is not uncommon, as chemotherapy can affect healthy cells as well as cancer cells. Report any side effects. Continue your course of treatment even though you feel ill unless your care team tells you to stop. This medication may increase your risk of getting an infection. Call your care team for advice if you get a fever, chills, sore throat, or other symptoms of a cold or flu. Do not treat yourself. Try to avoid being around people who are sick. Avoid taking medications that contain aspirin, acetaminophen, ibuprofen, naproxen, or ketoprofen unless instructed by your care team. These medications may hide a fever. Be careful brushing or flossing your teeth or using a  toothpick because you may get an infection or bleed more easily. If you have any dental work done, tell your dentist you are receiving this medication. You may need blood work while you are taking this medicine. Talk to your care team if you wish to become pregnant or think you might be pregnant. This medication can cause serious birth defects. A reliable form of contraception is recommended while taking this medication and for 2 weeks after stopping therapy. Talk to your care team about reliable forms of contraception. Do not father a child while taking this medication and for 3 and 1/2 months (14 weeks) after stopping therapy. Tell your care team right away if you think you or your partner might be pregnant. Do not breast-feed while taking this medication and for 2 weeks after stopping therapy. This medication may cause infertility. Talk to your care team if you are concerned about your fertility. What side effects may I notice from receiving this medication? Side effects that you should report to your care team as soon as possible: Allergic reactions--skin rash, itching, hives, swelling of the face, lips, tongue, or throat Heart rhythm changes--fast or irregular heartbeat, dizziness, feeling faint or lightheaded, chest pain, trouble breathing Infection--fever, chills, cough, sore throat, wounds that don't heal, pain or trouble when passing urine,  general feeling of discomfort or being unwell Pain, tingling, or numbness in the hands or feet Unusual bruising or bleeding Side effects that usually do not require medical attention (report to your care team if they continue or are bothersome): Constipation Fatigue Hair loss Headache Nausea Stomach pain This list may not describe all possible side effects. Call your doctor for medical advice about side effects. You may report side effects to FDA at 1-800-FDA-1088. Where should I keep my medication? This medication is given in a hospital or clinic  and will not be stored at home. NOTE: This sheet is a summary. It may not cover all possible information. If you have questions about this medicine, talk to your doctor, pharmacist, or health care provider.  2024 Elsevier/Gold Standard (2022-03-30 00:00:00)

## 2023-11-06 NOTE — Progress Notes (Signed)
Patient Care Team: Julieanne Manson, MD as PCP - General (Internal Medicine) Maisie Fus, MD as PCP - Cardiology (Cardiology) Lonie Peak, MD as Attending Physician (Radiation Oncology) Axel Filler, Larna Daughters, NP as Nurse Practitioner (Hematology and Oncology) Berna Bue, MD as Consulting Physician (General Surgery) Felecia Shelling, DPM as Consulting Physician (Podiatry) Graylin Shiver, MD as Consulting Physician (Gastroenterology) Serena Croissant, MD as Attending Physician (Hematology and Oncology)  DIAGNOSIS:  Encounter Diagnosis  Name Primary?   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)   06/21/2022 - 08/03/2022 Chemotherapy   Patient is on Treatment Plan : BREAST METASTATIC fam-trastuzumab deruxtecan-nxki (Enhertu) q21d     06/21/2022 - 04/19/2023 Chemotherapy   Patient is on Treatment Plan : BREAST METASTATIC Fam-Trastuzumab Deruxtecan-nxki (Enhertu) (5.4) q21d     05/18/2023 - 07/06/2023 Chemotherapy   Patient is on Treatment Plan : BREAST METASTATIC Sacituzumab govitecan-hziy [name]) D1,8 q21d     10/16/2023 -  Chemotherapy   Patient is on Treatment Plan : BREAST METASTATIC Eribulin D1,8 q21d     Malignant neoplasm of upper-outer quadrant of right breast in female, estrogen receptor positive (HCC)  06/21/2016 Initial Diagnosis   Malignant neoplasm of upper-outer quadrant of right breast in female, estrogen receptor positive (HCC)   06/21/2016 Initial Biopsy   Right breast biopsy, 10 oclock: IDC, grade 3, ER+(95%), PR+(80%),Ki67 20%, HER-2 negative (ratio 0.67). Right axilla core biopsy: carcinoma, grade 3, ER+(100%), PR+(90%), Ki67 25%, HER-2 negative (ratio 1.13).    07/17/2016 - 11/06/2016 Neo-Adjuvant  Chemotherapy   Received 2 cycles of Doxorubicin and Cyclophosphamide, then transitioned to weekly Paclitaxel (due to repeated febrile neutropenia) x 7 cycles, stopped early due to peripheral neuropathy, then completed her final 2 cycles of Doxorubicin and Cyclophosphamide.    12/19/2016 Surgery   Right breast lumpectomy ([facility]): IDC, grade 2, 1.6cm and 0.3cm, margins negative, 3 SLN positive for metastatic carcinoma.     12/26/2016 Surgery   ALND: metastatic carcinoma in one of 20 lymph nodes, and three nodes from previous lumpectomy.  Four positive nodes, consistent with pN2a.   02/07/2017 - 03/21/2017 Radiation Therapy   Adjuvant radiation Basilio [place]): 1) Right breast and nodes - 4 field: 50 Gy in 25 fractions. IM NODES: >95% receive at least 45Gy/92fx. 50Gy to SCLV/PAB @ 2Gy /fraction x 25 fractions. 2) Right breast boost: 10 Gy in 5 fractions   04/2017 -  Anti-estrogen oral therapy   Anastrozole 1mg  daily.  Bone density 03/23/2017 finds T score of -2.6, osteoporosis, plan to start Prolia following dental clearance Anastrozole stopped 01/16/18 Exemestane 25 mg daily 02/12/18  On [name], trial randomized to endocrine therapy alone   05/27/2020 Progression   chest CT scan 05/27/2020 shows bulky mediastinal and right hilar lymphadenopathy with right pleural nodules and a small right pleural effusion, no evidence of liver or bone involvement             (a) biopsy of right breast mass 06/02/2020 shows invasive ductal carcinoma, estrogen and progesterone receptor positive, HER-2 not amplified, with an MIB-1 of 40%   06/17/2020 Treatment Plan Change    fulvestrant to start 06/17/2020             (a) palbociclib to start 06/17/2020 at 125 mg daily 21 days on 7  days off             (b) palbociclib decreased to 125mg  every other day x 11 doses on 07/23/2020             (c) palbociclib dose decreased to100 mg daily, 21/7, starting with September cycle             (d) Palbociclib decreased to 75mg  daily  beginning with March cycle due to oral ulcers             (e) palbociclib discontinued November 2022 with progression   09/14/2021 PET scan   PET scan 09/14/2021 shows progression in liver and bone   10/05/2021 Pathology Results   liver biopsy 10/05/2021 confirms metastatic carcinoma, estrogen receptor strongly positive, progesterone receptor and HER2 negative, with an MIB-1 of 30%.   10/2021 Treatment Plan Change   fulvestrant continued, Xgeva added, palbociclib changed to abemaciclib November 2022 -Dose Decreased to 50mg  PO BID Daily   03/07/2022 Miscellaneous   [facility] molecular testing: ER positive, ER positive, MTAP: Detected, ESR 1 negative, BRCA 1 and 2 negative, MSI stable, PD-L1 negative, PIK 3 CA negative, PR negative,   06/21/2022 - 08/03/2022 Chemotherapy   Patient is on Treatment Plan : BREAST METASTATIC fam-trastuzumab deruxtecan-nxki (Enhertu) q21d     06/21/2022 - 04/19/2023 Chemotherapy   Patient is on Treatment Plan : BREAST METASTATIC Fam-Trastuzumab Deruxtecan-nxki (Enhertu) (5.4) q21d     01/25/2023 Cancer Staging   Staging form: Breast, AJCC 7th Edition - Pathologic: Stage IV (M1) - Signed by Loa Socks, NP on 01/25/2023   05/18/2023 - 07/06/2023 Chemotherapy   Patient is on Treatment Plan : BREAST METASTATIC Sacituzumab govitecan-hziy [name]) D1,8 q21d     07/23/2023 -  Chemotherapy   Xeloda 1000 mg bid 14 days on and 7 days off   08/27/2023 Procedure   Y90 hepatic radio embolization   10/16/2023 -  Chemotherapy   Patient is on Treatment Plan : BREAST METASTATIC Eribulin D1,8 q21d     Breast cancer, stage 4 (HCC)  03/09/2022 Initial Diagnosis   Breast cancer, stage 4 (HCC)   06/21/2022 - 08/03/2022 Chemotherapy   Patient is on Treatment Plan : BREAST METASTATIC fam-trastuzumab deruxtecan-nxki (Enhertu) q21d     06/21/2022 - 04/19/2023 Chemotherapy   Patient is on Treatment Plan : BREAST METASTATIC Fam-Trastuzumab Deruxtecan-nxki (Enhertu) (5.4) q21d      05/18/2023 - 07/06/2023 Chemotherapy   Patient is on Treatment Plan : BREAST METASTATIC Sacituzumab govitecan-hziy [name]) D1,8 q21d     10/16/2023 -  Chemotherapy   Patient is on Treatment Plan : BREAST METASTATIC Eribulin D1,8 q21d       CHIEF COMPLIANT: Cycle 2-day 1 eribulin  HISTORY OF PRESENT ILLNESS:   History of Present Illness   The patient, with a history of cancer, presents for a follow-up on chemotherapy.  cycle 1 day 8 was held due to neutropenia. She expresses satisfaction with her white count reaching normal, a rarity in her experience. She also notes that her hemoglobin is good and her platelets have recovered.  Shes extremely fatigued         ALLERGIES:  is allergic to eribulin mesylate, codeine, cymbalta [duloxetine hcl], hydrocodone, ultram [tramadol hcl], venlafaxine, and gabapentin.  MEDICATIONS:  Current Outpatient Medications  Medication Sig Dispense Refill   carvedilol (COREG) 3.125 MG tablet Take 1 tablet (3.125 mg total) by mouth 2 (two) times daily. 180 tablet 3   Cetirizine HCl (ZYRTEC ALLERGY PO) Take 10 mg by mouth daily.  cyclobenzaprine (FLEXERIL) 5 MG tablet Take 1 tablet (5 mg total) by mouth 3 (three) times daily as needed for muscle spasms. 30 tablet 0   Denosumab (XGEVA [place]) Inject 120 mg into the skin every 3 (three) months.     levofloxacin (LEVAQUIN) 750 MG tablet Take 1 tablet (750 mg total) by mouth daily. 7 tablet 0   lidocaine-prilocaine (EMLA) cream Apply to affected area once 30 g 3   naproxen sodium (ALEVE) 220 MG tablet Take 440-660 mg by mouth 2 (two) times daily as needed (pain).     omeprazole (PRILOSEC) 20 MG capsule Take 20 mg by mouth daily.     ondansetron (ZOFRAN) 8 MG tablet as needed for nausea or vomiting.     ondansetron (ZOFRAN) 8 MG tablet Take 1 tablet (8 mg total) by mouth every 8 (eight) hours as needed for nausea or vomiting. 30 tablet 1   potassium chloride (KLOR-CON M) 10 MEQ tablet Take 1 tablet (10 mEq total)  by mouth daily. 30 tablet 1   prochlorperazine (COMPAZINE) 10 MG tablet Take 1 tablet (10 mg total) by mouth every 6 (six) hours as needed for nausea or vomiting. 30 tablet 1   rizatriptan (MAXALT) 5 MG tablet Take 1 tablet (5 mg total) by mouth as needed for migraine. May repeat in 2 hours if needed 10 tablet 0   valACYclovir (VALTREX) 1000 MG tablet Take 1 tablet (1,000 mg total) by mouth 2 (two) times daily. 1/2 tab BID (Patient taking differently: Take 500 mg by mouth See admin instructions. Take 500 mg daily, may take a second 500 mg dose as needed for outbreaks) 180 tablet 4   No current facility-administered medications for this visit.    PHYSICAL EXAMINATION: ECOG PERFORMANCE STATUS: 1 - Symptomatic but completely ambulatory  Vitals:   11/06/23 1333  BP: 113/63  Pulse: 98  Resp: 18  Temp: 98.1 F (36.7 C)  SpO2: 99%   Filed Weights   11/06/23 1333  Weight: 212 lb 4.8 oz (96.3 kg)      LABORATORY DATA:  I have reviewed the data as listed    Latest Ref Rng & Units 10/23/2023    1:50 PM 10/16/2023    8:04 AM 10/02/2023    9:17 AM  CMP  Glucose 70 - 99 mg/dL 782  956  213   BUN 8 - 23 mg/dL 6  12  9    Creatinine 0.44 - 1.00 mg/dL 0.86  5.78  4.69   Sodium 135 - 145 mmol/L 137  139  139   Potassium 3.5 - 5.1 mmol/L 2.8  3.3  3.6   Chloride 98 - 111 mmol/L 107  106  109   CO2 22 - 32 mmol/L 26  25  24    Calcium 8.9 - 10.3 mg/dL 8.4  8.9  9.4   Total Protein 6.5 - 8.1 g/dL 6.0  6.9  6.8   Total Bilirubin <1.2 mg/dL 0.9  0.9  0.5   Alkaline Phos 38 - 126 U/L 219  300  273   AST 15 - 41 U/L 56  52  36   ALT 0 - 44 U/L 30  27  21      Lab Results  Component Value Date   WBC 5.2 11/06/2023   HGB 12.5 11/06/2023   HCT 37.1 11/06/2023   MCV 100.0 11/06/2023   PLT 142 (L) 11/06/2023   NEUTROABS 4.4 11/06/2023    ASSESSMENT & PLAN:  Malignant neoplasm of upper-outer quadrant of  right breast in female, estrogen receptor positive (HCC) 1.  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 bone metastasis: Currently on Xgeva every 3 months.   CT CAP 02/01/2022: Mild progression of disease with increased number and size of the numerous lung nodules in the right lung.  Persistent lymphadenopathy and right hilar and right paratracheal nodal areas, numerous hypovascular hepatic lesions slightly larger when compared to previous.   03/28/2023: CA 27-29: 84.4 04/19/2023: CA 27-29: 126 06/22/2023: CA 27-29: 204 09/13/2023: CA 27-29: 329   CT CAP 05/07/2023: Interval increase in size of right hepatic metastases 2.7 cm (to be 1.6 cm) and cluster of lesions in the left hepatic lobe are similar, bone metastases stable   Plan: [facility] molecular testing: ER positive, ER positive, TMB 5, PD-L1 negative, PIK 3 CA negative  -------------------------------------------------------- Current treatment: Cycle 2 day 1 Eribulin Chemo toxicities: Profound neutropenia: ANC 0, canceled today's chemo.  She will get the Neupogen injections on Saturdays prior to chemo. Mouth sores: Encouraged the patient to use salt water gargles.  She did not want Magic mouthwash. Thrombocytopenia: Watching and monitoring Hypokalemia: Send potassium 10 mEq daily. Muscle spasms: Could be related to hypokalemia.  I sent a prescription for Flexeril because the pain can be excruciating.   Return to clinic in 3 weeks for cycle 3 at a reduced dosage. CT scans after 3 cycles of chemo.   Orders Placed This Encounter  Procedures   CT CHEST ABDOMEN PELVIS W CONTRAST    Standing Status:   Future    Standing Expiration Date:   11/05/2024    Order Specific Question:   If indicated for the ordered procedure, I authorize the administration of contrast media per Radiology protocol    Answer:   Yes    Order Specific Question:   Does the patient have a contrast media/X-ray dye allergy?    Answer:    No    Order Specific Question:   Preferred imaging location?    Answer:   [facility]    Order Specific Question:   If indicated for the ordered procedure, I authorize the administration of oral contrast media per Radiology protocol    Answer:   Yes   The patient has a good understanding of the overall plan. she agrees with it. she will call with any problems that may develop before the next visit here. Total time spent: 30 mins including face to face time and time spent for planning, charting and co-ordination of care   Tamsen Meek, MD 11/06/23

## 2023-11-06 NOTE — Progress Notes (Signed)
Tolerated Halaven treatment well with pepcid 20mg  IVPB, cetirizine 10mg  IV, solumedrol 60mg  IV and compazine10mg  PO.

## 2023-11-07 ENCOUNTER — Other Ambulatory Visit: Payer: Self-pay | Admitting: Pharmacist

## 2023-11-07 DIAGNOSIS — C50411 Malignant neoplasm of upper-outer quadrant of right female breast: Secondary | ICD-10-CM

## 2023-11-07 DIAGNOSIS — C50911 Malignant neoplasm of unspecified site of right female breast: Secondary | ICD-10-CM

## 2023-11-07 DIAGNOSIS — C50919 Malignant neoplasm of unspecified site of unspecified female breast: Secondary | ICD-10-CM

## 2023-11-07 LAB — CANCER ANTIGEN 27.29: CA 27.29: 124.3 U/mL — ABNORMAL HIGH (ref 0.0–38.6)

## 2023-11-08 ENCOUNTER — Ambulatory Visit: Payer: Medicaid Other | Admitting: Adult Health

## 2023-11-08 ENCOUNTER — Other Ambulatory Visit: Payer: Self-pay

## 2023-11-08 ENCOUNTER — Ambulatory Visit: Payer: Medicaid Other

## 2023-11-08 ENCOUNTER — Other Ambulatory Visit: Payer: Medicaid Other

## 2023-11-09 MED ORDER — FILGRASTIM-SNDZ 300 MCG/0.5ML IJ SOSY
300.0000 ug | PREFILLED_SYRINGE | Freq: Once | INTRAMUSCULAR | Status: AC
  Administered 2023-11-10: 300 ug via SUBCUTANEOUS
  Filled 2023-11-09: qty 0.5

## 2023-11-10 ENCOUNTER — Inpatient Hospital Stay: Payer: Medicaid Other

## 2023-11-10 VITALS — BP 118/75 | HR 97 | Temp 98.1°F | Resp 18

## 2023-11-10 DIAGNOSIS — C50919 Malignant neoplasm of unspecified site of unspecified female breast: Secondary | ICD-10-CM

## 2023-11-10 DIAGNOSIS — C50911 Malignant neoplasm of unspecified site of right female breast: Secondary | ICD-10-CM

## 2023-11-10 DIAGNOSIS — Z5111 Encounter for antineoplastic chemotherapy: Secondary | ICD-10-CM | POA: Diagnosis not present

## 2023-11-10 DIAGNOSIS — C50411 Malignant neoplasm of upper-outer quadrant of right female breast: Secondary | ICD-10-CM

## 2023-11-10 NOTE — Patient Instructions (Signed)
Filgrastim Injection What is this medication? FILGRASTIM (fil GRA stim) lowers the risk of infection in people who are receiving chemotherapy. It works by helping your body make more white blood cells, which protects your body from infection. It may also be used to help people who have been exposed to high doses of radiation. It can be used to help prepare your body before a stem cell transplant. It works by helping your bone marrow make and release stem cells into the blood. This medicine may be used for other purposes; ask your health care provider or pharmacist if you have questions. COMMON BRAND NAME(S): Neupogen, Nivestym, Releuko, Zarxio What should I tell my care team before I take this medication? They need to know if you have any of these conditions: History of blood diseases, such as sickle cell anemia Kidney disease Recent or ongoing radiation An unusual or allergic reaction to filgrastim, pegfilgrastim, latex, rubber, other medications, foods, dyes, or preservatives Pregnant or trying to get pregnant Breast-feeding How should I use this medication? This medication is injected under the skin or into a vein. It is usually given by your care team in a hospital or clinic setting. It may be given at home. If you get this medication at home, you will be taught how to prepare and give it. Use exactly as directed. Take it as directed on the prescription label at the same time every day. Keep taking it unless your care team tells you to stop. It is important that you put your used needles and syringes in a special sharps container. Do not put them in a trash can. If you do not have a sharps container, call your pharmacist or care team to get one. This medication comes with INSTRUCTIONS FOR USE. Ask your pharmacist for directions on how to use this medication. Read the information carefully. Talk to your pharmacist or care team if you have questions. Talk to your care team about the use of this  medication in children. While it may be prescribed for children for selected conditions, precautions do apply. Overdosage: If you think you have taken too much of this medicine contact a poison control center or emergency room at once. NOTE: This medicine is only for you. Do not share this medicine with others. What if I miss a dose? It is important not to miss any doses. Talk to your care team about what to do if you miss a dose. What may interact with this medication? Medications that may cause a release of neutrophils, such as lithium This list may not describe all possible interactions. Give your health care provider a list of all the medicines, herbs, non-prescription drugs, or dietary supplements you use. Also tell them if you smoke, drink alcohol, or use illegal drugs. Some items may interact with your medicine. What should I watch for while using this medication? Your condition will be monitored carefully while you are receiving this medication. You may need bloodwork while taking this medication. Talk to your care team about your risk of cancer. You may be more at risk for certain types of cancer if you take this medication. What side effects may I notice from receiving this medication? Side effects that you should report to your care team as soon as possible: Allergic reactions--skin rash, itching, hives, swelling of the face, lips, tongue, or throat Capillary leak syndrome--stomach or muscle pain, unusual weakness or fatigue, feeling faint or lightheaded, decrease in the amount of urine, swelling of the ankles, hands, or   feet, trouble breathing High white blood cell level--fever, fatigue, trouble breathing, night sweats, change in vision, weight loss Inflammation of the aorta--fever, fatigue, back, chest, or stomach pain, severe headache Kidney injury (glomerulonephritis)--decrease in the amount of urine, red or dark brown urine, foamy or bubbly urine, swelling of the ankles, hands, or  feet Shortness of breath or trouble breathing Spleen injury--pain in upper left stomach or shoulder Unusual bruising or bleeding Side effects that usually do not require medical attention (report to your care team if they continue or are bothersome): Back pain Bone pain Fatigue Fever Headache Nausea This list may not describe all possible side effects. Call your doctor for medical advice about side effects. You may report side effects to FDA at 1-800-FDA-1088. Where should I keep my medication? Keep out of the reach of children and pets. Keep this medication in the original packaging until you are ready to take it. Protect from light. See product for storage information. Each product may have different instructions. Get rid of any unused medication after the expiration date. To get rid of medications that are no longer needed or have expired: Take the medication to a medications take-back program. Check with your pharmacy or law enforcement to find a location. If you cannot return the medication, ask your pharmacist or care team how to get rid of this medication safely. NOTE: This sheet is a summary. It may not cover all possible information. If you have questions about this medicine, talk to your doctor, pharmacist, or health care provider.  2024 Elsevier/Gold Standard (2022-04-13 00:00:00)  

## 2023-11-12 ENCOUNTER — Encounter: Payer: Self-pay | Admitting: Hematology and Oncology

## 2023-11-13 ENCOUNTER — Inpatient Hospital Stay: Payer: Medicaid Other

## 2023-11-13 ENCOUNTER — Inpatient Hospital Stay (HOSPITAL_BASED_OUTPATIENT_CLINIC_OR_DEPARTMENT_OTHER): Payer: Medicaid Other | Admitting: Hematology and Oncology

## 2023-11-13 ENCOUNTER — Encounter: Payer: Self-pay | Admitting: *Deleted

## 2023-11-13 VITALS — BP 106/75 | HR 101 | Temp 97.7°F | Resp 18 | Ht 64.0 in | Wt 208.7 lb

## 2023-11-13 DIAGNOSIS — C50919 Malignant neoplasm of unspecified site of unspecified female breast: Secondary | ICD-10-CM | POA: Diagnosis not present

## 2023-11-13 DIAGNOSIS — C50911 Malignant neoplasm of unspecified site of right female breast: Secondary | ICD-10-CM

## 2023-11-13 DIAGNOSIS — C50411 Malignant neoplasm of upper-outer quadrant of right female breast: Secondary | ICD-10-CM | POA: Diagnosis not present

## 2023-11-13 DIAGNOSIS — Z170 Estrogen receptor positive status [ER+]: Secondary | ICD-10-CM | POA: Diagnosis not present

## 2023-11-13 DIAGNOSIS — C773 Secondary and unspecified malignant neoplasm of axilla and upper limb lymph nodes: Secondary | ICD-10-CM

## 2023-11-13 DIAGNOSIS — Z5111 Encounter for antineoplastic chemotherapy: Secondary | ICD-10-CM | POA: Diagnosis not present

## 2023-11-13 LAB — CMP (CANCER CENTER ONLY)
ALT: 23 U/L (ref 0–44)
AST: 55 U/L — ABNORMAL HIGH (ref 15–41)
Albumin: 2.8 g/dL — ABNORMAL LOW (ref 3.5–5.0)
Alkaline Phosphatase: 330 U/L — ABNORMAL HIGH (ref 38–126)
Anion gap: 7 (ref 5–15)
BUN: 7 mg/dL — ABNORMAL LOW (ref 8–23)
CO2: 24 mmol/L (ref 22–32)
Calcium: 8 mg/dL — ABNORMAL LOW (ref 8.9–10.3)
Chloride: 110 mmol/L (ref 98–111)
Creatinine: 0.5 mg/dL (ref 0.44–1.00)
GFR, Estimated: >60 mL/min (ref >60)
Glucose, Bld: 96 mg/dL (ref 70–99)
Potassium: 3.1 mmol/L — ABNORMAL LOW (ref 3.5–5.1)
Sodium: 141 mmol/L (ref 135–145)
Total Bilirubin: 2 mg/dL — ABNORMAL HIGH (ref <1.2)
Total Protein: 5.7 g/dL — ABNORMAL LOW (ref 6.5–8.1)

## 2023-11-13 LAB — MAGNESIUM: Magnesium: 1.8 mg/dL (ref 1.7–2.4)

## 2023-11-13 LAB — CBC WITH DIFFERENTIAL (CANCER CENTER ONLY)
Abs Immature Granulocytes: 0.05 10*3/uL (ref 0.00–0.07)
Basophils Absolute: 0.1 10*3/uL (ref 0.0–0.1)
Basophils Relative: 10 %
Eosinophils Absolute: 0 10*3/uL (ref 0.0–0.5)
Eosinophils Relative: 0 %
HCT: 35.3 % — ABNORMAL LOW (ref 36.0–46.0)
Hemoglobin: 12 g/dL (ref 12.0–15.0)
Immature Granulocytes: 8 %
Lymphocytes Relative: 36 %
Lymphs Abs: 0.2 10*3/uL — ABNORMAL LOW (ref 0.7–4.0)
MCH: 33.3 pg (ref 26.0–34.0)
MCHC: 34 g/dL (ref 30.0–36.0)
MCV: 98.1 fL (ref 80.0–100.0)
Monocytes Absolute: 0.1 10*3/uL (ref 0.1–1.0)
Monocytes Relative: 22 %
Neutro Abs: 0.2 10*3/uL — CL (ref 1.7–7.7)
Neutrophils Relative %: 24 %
Platelet Count: 63 10*3/uL — ABNORMAL LOW (ref 150–400)
RBC: 3.6 MIL/uL — ABNORMAL LOW (ref 3.87–5.11)
RDW: 19.1 % — ABNORMAL HIGH (ref 11.5–15.5)
Smear Review: NORMAL
WBC Count: 0.6 10*3/uL — CL (ref 4.0–10.5)
nRBC: 0 % (ref 0.0–0.2)

## 2023-11-13 MED ORDER — SODIUM CHLORIDE 0.9% FLUSH
10.0000 mL | Freq: Once | INTRAVENOUS | Status: AC
  Administered 2023-11-13: 10 mL

## 2023-11-13 NOTE — Progress Notes (Signed)
Patient Care Team: Julieanne Manson, MD as PCP - General (Internal Medicine) Maisie Fus, MD as PCP - Cardiology (Cardiology) Lonie Peak, MD as Attending Physician (Radiation Oncology) Axel Filler, Larna Daughters, NP as Nurse Practitioner (Hematology and Oncology) Berna Bue, MD as Consulting Physician (General Surgery) Felecia Shelling, DPM as Consulting Physician (Podiatry) Graylin Shiver, MD as Consulting Physician (Gastroenterology) Serena Croissant, MD as Attending Physician (Hematology and Oncology)  DIAGNOSIS:  Encounter Diagnoses  Name Primary?   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)   06/21/2022 - 08/03/2022 Chemotherapy   Patient is on Treatment Plan : BREAST METASTATIC fam-trastuzumab deruxtecan-nxki (Enhertu) q21d     06/21/2022 - 04/19/2023 Chemotherapy   Patient is on Treatment Plan : BREAST METASTATIC Fam-Trastuzumab Deruxtecan-nxki (Enhertu) (5.4) q21d     05/18/2023 - 07/06/2023 Chemotherapy   Patient is on Treatment Plan : BREAST METASTATIC Sacituzumab govitecan-hziy [name]) D1,8 q21d     10/16/2023 -  Chemotherapy   Patient is on Treatment Plan : BREAST METASTATIC Eribulin D1,8 q21d     Malignant neoplasm of upper-outer quadrant of right breast in female, estrogen receptor positive (HCC)  06/21/2016 Initial Diagnosis   Malignant neoplasm of upper-outer quadrant of right breast in female, estrogen receptor positive (HCC)   06/21/2016 Initial Biopsy   Right breast biopsy, 10 oclock: IDC, grade 3, ER+(95%), PR+(80%),Ki67 20%, HER-2 negative (ratio 0.67). Right  axilla core biopsy: carcinoma, grade 3, ER+(100%), PR+(90%), Ki67 25%, HER-2 negative (ratio 1.13).    07/17/2016 - 11/06/2016 Neo-Adjuvant Chemotherapy   Received 2 cycles of Doxorubicin and Cyclophosphamide, then transitioned to weekly Paclitaxel (due to repeated febrile neutropenia) x 7 cycles, stopped early due to peripheral neuropathy, then completed her final 2 cycles of Doxorubicin and Cyclophosphamide.    12/19/2016 Surgery   Right breast lumpectomy ([facility]): IDC, grade 2, 1.6cm and 0.3cm, margins negative, 3 SLN positive for metastatic carcinoma.     12/26/2016 Surgery   ALND: metastatic carcinoma in one of 20 lymph nodes, and three nodes from previous lumpectomy.  Four positive nodes, consistent with pN2a.   02/07/2017 - 03/21/2017 Radiation Therapy   Adjuvant radiation Bas[facility] [place]): 1) Right breast and nodes - 4 field: 50 Gy in 25 fractions. IM NODES: >95% receive at least 45Gy/92fx. 50Gy to SCLV/PAB @ 2Gy /fraction x 25 fractions. 2) Right breast boost: 10 Gy in 5 fractions   04/2017 -  Anti-estrogen oral therapy   Anastrozole 1mg  daily.  Bone density 03/23/2017 finds T score of -2.6, osteoporosis, plan to start Prolia following dental clearance Anastrozole stopped 01/16/18 Exemestane 25 mg daily 02/12/18  On [name], trial randomized to endocrine therapy alone   05/27/2020 Progression   chest CT scan 05/27/2020 shows bulky mediastinal and right hilar lymphadenopathy with right pleural nodules and a small right pleural effusion, no evidence of liver or bone involvement             (a) biopsy of right breast mass 06/02/2020 shows invasive ductal carcinoma, estrogen and progesterone receptor positive, HER-2 not amplified, with an MIB-1 of 40%   06/17/2020 Treatment Plan Change    fulvestrant to start 06/17/2020             (  a) palbociclib to start 06/17/2020 at 125 mg daily 21 days on 7 days off             (b) palbociclib decreased to 125mg  every other day x 11 doses on 07/23/2020              (c) palbociclib dose decreased to100 mg daily, 21/7, starting with September cycle             (d) Palbociclib decreased to 75mg  daily beginning with March cycle due to oral ulcers             (e) palbociclib discontinued November 2022 with progression   09/14/2021 PET scan   PET scan 09/14/2021 shows progression in liver and bone   10/05/2021 Pathology Results   liver biopsy 10/05/2021 confirms metastatic carcinoma, estrogen receptor strongly positive, progesterone receptor and HER2 negative, with an MIB-1 of 30%.   10/2021 Treatment Plan Change   fulvestrant continued, Xgeva added, palbociclib changed to abemaciclib November 2022 -Dose Decreased to 50mg  PO BID Daily   03/07/2022 Miscellaneous   [facility] molecular testing: ER positive, ER positive, MTAP: Detected, ESR 1 negative, BRCA 1 and 2 negative, MSI stable, PD-L1 negative, PIK 3 CA negative, PR negative,   06/21/2022 - 08/03/2022 Chemotherapy   Patient is on Treatment Plan : BREAST METASTATIC fam-trastuzumab deruxtecan-nxki (Enhertu) q21d     06/21/2022 - 04/19/2023 Chemotherapy   Patient is on Treatment Plan : BREAST METASTATIC Fam-Trastuzumab Deruxtecan-nxki (Enhertu) (5.4) q21d     01/25/2023 Cancer Staging   Staging form: Breast, AJCC 7th Edition - Pathologic: Stage IV (M1) - Signed by Loa Socks, NP on 01/25/2023   05/18/2023 - 07/06/2023 Chemotherapy   Patient is on Treatment Plan : BREAST METASTATIC Sacituzumab govitecan-hziy [name]) D1,8 q21d     07/23/2023 -  Chemotherapy   Xeloda 1000 mg bid 14 days on and 7 days off   08/27/2023 Procedure   Y90 hepatic radio embolization   10/16/2023 -  Chemotherapy   Patient is on Treatment Plan : BREAST METASTATIC Eribulin D1,8 q21d     Breast cancer, stage 4 (HCC)  03/09/2022 Initial Diagnosis   Breast cancer, stage 4 (HCC)   06/21/2022 - 08/03/2022 Chemotherapy   Patient is on Treatment Plan : BREAST METASTATIC fam-trastuzumab deruxtecan-nxki (Enhertu) q21d      06/21/2022 - 04/19/2023 Chemotherapy   Patient is on Treatment Plan : BREAST METASTATIC Fam-Trastuzumab Deruxtecan-nxki (Enhertu) (5.4) q21d     05/18/2023 - 07/06/2023 Chemotherapy   Patient is on Treatment Plan : BREAST METASTATIC Sacituzumab govitecan-hziy [name]) D1,8 q21d     10/16/2023 -  Chemotherapy   Patient is on Treatment Plan : BREAST METASTATIC Eribulin D1,8 q21d       CHIEF COMPLIANT: Cycle 2-day 8 (chemo being held for neutropenia)  HISTORY OF PRESENT ILLNESS:   History of Present Illness   The patient, with a history of met breast cancer, is currently undergoing chemotherapy. She reports that the most recent round of treatment was better than the previous one, with less severe leg pain. However, she continues to experience fatigue, which she describes as not as bad as before. She also reports some roughness in her gums but no ulcers. She mentions that the Neupogen shot, which she receives as part of her treatment, makes her feel rundown and achy for a couple of days. She manages these symptoms with naproxen, which she takes as needed, usually no more than twice a day and at least six hours apart. She  also mentions that she has some fluid retention and swelling.         ALLERGIES:  is allergic to eribulin mesylate, codeine, cymbalta [duloxetine hcl], hydrocodone, ultram [tramadol hcl], venlafaxine, and gabapentin.  MEDICATIONS:  Current Outpatient Medications  Medication Sig Dispense Refill   carvedilol (COREG) 3.125 MG tablet Take 1 tablet (3.125 mg total) by mouth 2 (two) times daily. 180 tablet 3   Cetirizine HCl (ZYRTEC ALLERGY PO) Take 10 mg by mouth daily.     cyclobenzaprine (FLEXERIL) 5 MG tablet Take 1 tablet (5 mg total) by mouth 3 (three) times daily as needed for muscle spasms. 30 tablet 0   Denosumab (XGEVA [place]) Inject 120 mg into the skin every 3 (three) months.     levofloxacin (LEVAQUIN) 750 MG tablet Take 1 tablet (750 mg total) by mouth daily. 7 tablet 0    lidocaine-prilocaine (EMLA) cream Apply to affected area once 30 g 3   naproxen sodium (ALEVE) 220 MG tablet Take 440-660 mg by mouth 2 (two) times daily as needed (pain).     omeprazole (PRILOSEC) 20 MG capsule Take 20 mg by mouth daily.     ondansetron (ZOFRAN) 8 MG tablet as needed for nausea or vomiting.     ondansetron (ZOFRAN) 8 MG tablet Take 1 tablet (8 mg total) by mouth every 8 (eight) hours as needed for nausea or vomiting. 30 tablet 1   potassium chloride (KLOR-CON M) 10 MEQ tablet Take 1 tablet (10 mEq total) by mouth daily. 30 tablet 1   prochlorperazine (COMPAZINE) 10 MG tablet Take 1 tablet (10 mg total) by mouth every 6 (six) hours as needed for nausea or vomiting. 30 tablet 1   rizatriptan (MAXALT) 5 MG tablet Take 1 tablet (5 mg total) by mouth as needed for migraine. May repeat in 2 hours if needed 10 tablet 0   valACYclovir (VALTREX) 1000 MG tablet Take 1 tablet (1,000 mg total) by mouth 2 (two) times daily. 1/2 tab BID (Patient taking differently: Take 500 mg by mouth See admin instructions. Take 500 mg daily, may take a second 500 mg dose as needed for outbreaks) 180 tablet 4   No current facility-administered medications for this visit.    PHYSICAL EXAMINATION: ECOG PERFORMANCE STATUS: 1 - Symptomatic but completely ambulatory  Vitals:   11/13/23 0908  BP: 106/75  Pulse: (!) 101  Resp: 18  Temp: 97.7 F (36.5 C)  SpO2: 99%   Filed Weights   11/13/23 0908  Weight: 208 lb 11.2 oz (94.7 kg)     LABORATORY DATA:  I have reviewed the data as listed    Latest Ref Rng & Units 11/06/2023    1:16 PM 10/23/2023    1:50 PM 10/16/2023    8:04 AM  CMP  Glucose 70 - 99 mg/dL 403  474  259   BUN 8 - 23 mg/dL 9  6  12    Creatinine 0.44 - 1.00 mg/dL 5.63  8.75  6.43   Sodium 135 - 145 mmol/L 139  137  139   Potassium 3.5 - 5.1 mmol/L 3.5  2.8  3.3   Chloride 98 - 111 mmol/L 110  107  106   CO2 22 - 32 mmol/L 25  26  25    Calcium 8.9 - 10.3 mg/dL 8.1  8.4  8.9    Total Protein 6.5 - 8.1 g/dL 5.6  6.0  6.9   Total Bilirubin <1.2 mg/dL 1.4  0.9  0.9   Alkaline Phos  38 - 126 U/L 302  219  300   AST 15 - 41 U/L 53  56  52   ALT 0 - 44 U/L 21  30  27      Lab Results  Component Value Date   WBC 0.6 (LL) 11/13/2023   HGB 12.0 11/13/2023   HCT 35.3 (L) 11/13/2023   MCV 98.1 11/13/2023   PLT 63 (L) 11/13/2023   NEUTROABS PENDING 11/13/2023    ASSESSMENT & PLAN:  Malignant neoplasm of upper-outer quadrant of right breast in female, estrogen receptor positive (HCC) 1.  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 bone metastasis: Currently on Xgeva every 3 months.   CT CAP 02/01/2022: Mild progression of disease with increased number and size of the numerous lung nodules in the right lung.  Persistent lymphadenopathy and right hilar and right paratracheal nodal areas, numerous hypovascular hepatic lesions slightly larger when compared to previous.   03/28/2023: CA 27-29: 84.4 04/19/2023: CA 27-29: 126 06/22/2023: CA 27-29: 204 09/13/2023: CA 27-29: 329 11/06/2023: CA 27-29: 124   CT CAP 05/07/2023: Interval increase in size of right hepatic metastases 2.7 cm (to be 1.6 cm) and cluster of lesions in the left hepatic lobe are similar, bone metastases stable   Plan: [facility] molecular testing: ER positive, ER positive, TMB 5, PD-L1 negative, PIK 3 CA negative  -------------------------------------------------------- Current treatment: Cycle 2 day 8 Eribulin (chemo being held for neutropenia) Chemo toxicities: Profound neutropenia: Previously we could not administer cycle 1 day 8.  She has been getting Neupogen injections on Saturdays prior to chemo. Mouth sores: Encouraged the patient to use salt water gargles.  She did not want Magic mouthwash. Thrombocytopenia: Watching and monitoring Hypokalemia: On potassium 10 mEq daily. Muscle  spasms: Could be related to hypokalemia.       CT scans after 3 cycles of chemo. The improvement in CA 27-29 is very encouraging.  We will remove all subsequent day 8 chemotherapy infusions because her blood counts are not going to support them.     Orders Placed This Encounter  Procedures   CBC with Differential (Cancer Center Only)    Standing Status:   Future    Standing Expiration Date:   01/06/2025   CMP (Cancer Center only)    Standing Status:   Future    Standing Expiration Date:   01/06/2025   Magnesium    Standing Status:   Future    Standing Expiration Date:   01/06/2025   CBC with Differential (Cancer Center Only)    Standing Status:   Future    Standing Expiration Date:   01/27/2025   CMP (Cancer Center only)    Standing Status:   Future    Standing Expiration Date:   01/27/2025   Magnesium    Standing Status:   Future    Standing Expiration Date:   01/27/2025   The patient has a good understanding of the overall plan. she agrees with it. she will call with any problems that may develop before the next visit here. Total time spent: 30 mins including face to face time and time spent for planning, charting and co-ordination of care   Tamsen Meek, MD 11/13/23

## 2023-11-13 NOTE — Assessment & Plan Note (Signed)
1.  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 bone metastasis: Currently on Xgeva every 3 months.   CT CAP 02/01/2022: Mild progression of disease with increased number and size of the numerous lung nodules in the right lung.  Persistent lymphadenopathy and right hilar and right paratracheal nodal areas, numerous hypovascular hepatic lesions slightly larger when compared to previous.   03/28/2023: CA 27-29: 84.4 04/19/2023: CA 27-29: 126 06/22/2023: CA 27-29: 204 09/13/2023: CA 27-29: 329 11/06/2023: CA 27-29: 124   CT CAP 05/07/2023: Interval increase in size of right hepatic metastases 2.7 cm (to be 1.6 cm) and cluster of lesions in the left hepatic lobe are similar, bone metastases stable   Plan: [facility] molecular testing: ER positive, ER positive, TMB 5, PD-L1 negative, PIK 3 CA negative  -------------------------------------------------------- Current treatment: Cycle 2 day 8 Eribulin Chemo toxicities: Profound neutropenia: Previously we could not administer cycle 1 day 8.  She has been getting Neupogen injections on Saturdays prior to chemo. Mouth sores: Encouraged the patient to use salt water gargles.  She did not want Magic mouthwash. Thrombocytopenia: Watching and monitoring Hypokalemia: On potassium 10 mEq daily. Muscle spasms: Could be related to hypokalemia.     Return to clinic in 2 weeks for cycle 3 at a reduced dosage. CT scans after 3 cycles of chemo. The improvement in CA 27-29 is very encouraging.

## 2023-11-13 NOTE — Progress Notes (Signed)
CRITICAL VALUE STICKER  CRITICAL VALUE: WBC 0.63 and ANC 0.15  RECEIVER (on-site recipient of call): Adelina Mings, RN  DATE & TIME NOTIFIED: 11/13/23 at 0920   MD NOTIFIED: Serena Croissant, MD  TIME OF NOTIFICATION: 11/13/23 at 4540  RESPONSE:  MD notified and verbalized understanding.

## 2023-11-14 ENCOUNTER — Other Ambulatory Visit: Payer: Self-pay | Admitting: Hematology and Oncology

## 2023-11-14 LAB — CANCER ANTIGEN 27.29: CA 27.29: 99.1 U/mL — ABNORMAL HIGH (ref 0.0–38.6)

## 2023-11-15 ENCOUNTER — Other Ambulatory Visit: Payer: Self-pay

## 2023-11-15 ENCOUNTER — Other Ambulatory Visit: Payer: Self-pay | Admitting: Pharmacist

## 2023-11-15 ENCOUNTER — Encounter: Payer: Self-pay | Admitting: Hematology and Oncology

## 2023-11-21 ENCOUNTER — Ambulatory Visit: Payer: Medicaid Other

## 2023-11-21 ENCOUNTER — Other Ambulatory Visit: Payer: Medicaid Other

## 2023-11-21 ENCOUNTER — Ambulatory Visit: Payer: Medicaid Other | Admitting: Hematology and Oncology

## 2023-11-22 ENCOUNTER — Other Ambulatory Visit: Payer: Self-pay

## 2023-11-22 ENCOUNTER — Inpatient Hospital Stay: Payer: Medicaid Other

## 2023-11-22 VITALS — HR 99 | Temp 98.4°F | Resp 18

## 2023-11-22 DIAGNOSIS — Z170 Estrogen receptor positive status [ER+]: Secondary | ICD-10-CM

## 2023-11-22 DIAGNOSIS — C50911 Malignant neoplasm of unspecified site of right female breast: Secondary | ICD-10-CM

## 2023-11-22 DIAGNOSIS — C50919 Malignant neoplasm of unspecified site of unspecified female breast: Secondary | ICD-10-CM

## 2023-11-22 DIAGNOSIS — Z5111 Encounter for antineoplastic chemotherapy: Secondary | ICD-10-CM | POA: Diagnosis not present

## 2023-11-22 MED ORDER — FILGRASTIM-SNDZ 300 MCG/0.5ML IJ SOSY
300.0000 ug | PREFILLED_SYRINGE | Freq: Once | INTRAMUSCULAR | Status: AC
  Administered 2023-11-22: 300 ug via SUBCUTANEOUS

## 2023-11-23 ENCOUNTER — Telehealth: Payer: Self-pay | Admitting: *Deleted

## 2023-11-23 NOTE — Telephone Encounter (Signed)
Received call from pt with complaint of right sided abdomen distention as well as bilateral lower extremity and upper extremity swelling. Pt denies any fever or jaundice color at this time. MD out of office, [facility] notified.  [facility] unable to see pt today and request pt go to ED over the weekend if symptoms become worse or pt develops shortness of breath.  Pt educated and verbalized understanding.

## 2023-11-24 ENCOUNTER — Ambulatory Visit: Payer: Medicaid Other

## 2023-11-26 ENCOUNTER — Encounter (HOSPITAL_COMMUNITY): Payer: Self-pay

## 2023-11-26 ENCOUNTER — Inpatient Hospital Stay: Payer: Medicaid Other

## 2023-11-26 ENCOUNTER — Encounter: Payer: Self-pay | Admitting: Hematology and Oncology

## 2023-11-26 ENCOUNTER — Other Ambulatory Visit: Payer: Self-pay

## 2023-11-26 ENCOUNTER — Ambulatory Visit (HOSPITAL_BASED_OUTPATIENT_CLINIC_OR_DEPARTMENT_OTHER)
Admission: RE | Admit: 2023-11-26 | Discharge: 2023-11-26 | Disposition: A | Discharge status: Home / Self Care | Payer: Medicaid Other | Source: Ambulatory Visit | Attending: Hematology and Oncology | Admitting: Hematology and Oncology

## 2023-11-26 ENCOUNTER — Inpatient Hospital Stay (HOSPITAL_BASED_OUTPATIENT_CLINIC_OR_DEPARTMENT_OTHER): Payer: Medicaid Other | Admitting: Hematology and Oncology

## 2023-11-26 ENCOUNTER — Ambulatory Visit (HOSPITAL_COMMUNITY)
Admission: RE | Admit: 2023-11-26 | Discharge: 2023-11-26 | Disposition: A | Discharge status: Home / Self Care | Payer: Medicaid Other | Source: Ambulatory Visit | Attending: Hematology and Oncology | Admitting: Hematology and Oncology

## 2023-11-26 ENCOUNTER — Telehealth: Payer: Self-pay

## 2023-11-26 VITALS — BP 114/67 | HR 99 | Temp 97.9°F | Resp 18 | Ht 64.0 in | Wt 225.3 lb

## 2023-11-26 DIAGNOSIS — R600 Localized edema: Secondary | ICD-10-CM

## 2023-11-26 DIAGNOSIS — Z79899 Other long term (current) drug therapy: Secondary | ICD-10-CM

## 2023-11-26 DIAGNOSIS — Z5181 Encounter for therapeutic drug level monitoring: Secondary | ICD-10-CM

## 2023-11-26 DIAGNOSIS — C773 Secondary and unspecified malignant neoplasm of axilla and upper limb lymph nodes: Secondary | ICD-10-CM

## 2023-11-26 DIAGNOSIS — C50911 Malignant neoplasm of unspecified site of right female breast: Secondary | ICD-10-CM

## 2023-11-26 DIAGNOSIS — Z170 Estrogen receptor positive status [ER+]: Secondary | ICD-10-CM | POA: Insufficient documentation

## 2023-11-26 DIAGNOSIS — Z5111 Encounter for antineoplastic chemotherapy: Secondary | ICD-10-CM | POA: Diagnosis not present

## 2023-11-26 DIAGNOSIS — C50411 Malignant neoplasm of upper-outer quadrant of right female breast: Secondary | ICD-10-CM | POA: Insufficient documentation

## 2023-11-26 DIAGNOSIS — C50919 Malignant neoplasm of unspecified site of unspecified female breast: Secondary | ICD-10-CM

## 2023-11-26 HISTORY — DX: Secondary malignant neoplasm of liver and intrahepatic bile duct: C78.7

## 2023-11-26 HISTORY — DX: Secondary malignant neoplasm of bone: C79.51

## 2023-11-26 HISTORY — DX: Malignant neoplasm of unspecified site of unspecified female breast: C50.919

## 2023-11-26 HISTORY — DX: Secondary malignant neoplasm of unspecified lung: C78.00

## 2023-11-26 LAB — CBC WITH DIFFERENTIAL (CANCER CENTER ONLY)
Abs Immature Granulocytes: 0.01 10*3/uL (ref 0.00–0.07)
Basophils Absolute: 0 10*3/uL (ref 0.0–0.1)
Basophils Relative: 1 %
Eosinophils Absolute: 0 10*3/uL (ref 0.0–0.5)
Eosinophils Relative: 0 %
HCT: 33.3 % — ABNORMAL LOW (ref 36.0–46.0)
Hemoglobin: 11.5 g/dL — ABNORMAL LOW (ref 12.0–15.0)
Immature Granulocytes: 0 %
Lymphocytes Relative: 13 %
Lymphs Abs: 0.4 10*3/uL — ABNORMAL LOW (ref 0.7–4.0)
MCH: 34.1 pg — ABNORMAL HIGH (ref 26.0–34.0)
MCHC: 34.5 g/dL (ref 30.0–36.0)
MCV: 98.8 fL (ref 80.0–100.0)
Monocytes Absolute: 0.6 10*3/uL (ref 0.1–1.0)
Monocytes Relative: 21 %
Neutro Abs: 1.9 10*3/uL (ref 1.7–7.7)
Neutrophils Relative %: 65 %
Platelet Count: 126 10*3/uL — ABNORMAL LOW (ref 150–400)
RBC: 3.37 MIL/uL — ABNORMAL LOW (ref 3.87–5.11)
RDW: 20.7 % — ABNORMAL HIGH (ref 11.5–15.5)
WBC Count: 2.9 10*3/uL — ABNORMAL LOW (ref 4.0–10.5)
nRBC: 0 % (ref 0.0–0.2)

## 2023-11-26 LAB — CMP (CANCER CENTER ONLY)
ALT: 19 U/L (ref 0–44)
AST: 47 U/L — ABNORMAL HIGH (ref 15–41)
Albumin: 2.6 g/dL — ABNORMAL LOW (ref 3.5–5.0)
Alkaline Phosphatase: 298 U/L — ABNORMAL HIGH (ref 38–126)
Anion gap: 5 (ref 5–15)
BUN: 8 mg/dL (ref 8–23)
CO2: 23 mmol/L (ref 22–32)
Calcium: 8 mg/dL — ABNORMAL LOW (ref 8.9–10.3)
Chloride: 112 mmol/L — ABNORMAL HIGH (ref 98–111)
Creatinine: 0.56 mg/dL (ref 0.44–1.00)
GFR, Estimated: >60 mL/min (ref >60)
Glucose, Bld: 106 mg/dL — ABNORMAL HIGH (ref 70–99)
Potassium: 3.5 mmol/L (ref 3.5–5.1)
Sodium: 140 mmol/L (ref 135–145)
Total Bilirubin: 2.3 mg/dL — ABNORMAL HIGH (ref <1.2)
Total Protein: 5.1 g/dL — ABNORMAL LOW (ref 6.5–8.1)

## 2023-11-26 LAB — MAGNESIUM: Magnesium: 1.7 mg/dL (ref 1.7–2.4)

## 2023-11-26 MED ORDER — HEPARIN SOD (PORK) LOCK FLUSH 100 UNIT/ML IV SOLN
500.0000 [IU] | Freq: Once | INTRAVENOUS | Status: AC
  Administered 2023-11-26: 500 [IU]

## 2023-11-26 MED ORDER — HEPARIN SOD (PORK) LOCK FLUSH 100 UNIT/ML IV SOLN
INTRAVENOUS | Status: AC
  Filled 2023-11-26: qty 5

## 2023-11-26 MED ORDER — SODIUM CHLORIDE 0.9% FLUSH
10.0000 mL | Freq: Once | INTRAVENOUS | Status: AC
  Administered 2023-11-26: 10 mL

## 2023-11-26 MED ORDER — HEPARIN SOD (PORK) LOCK FLUSH 100 UNIT/ML IV SOLN
500.0000 [IU] | Freq: Once | INTRAVENOUS | Status: AC
  Administered 2023-11-26: 500 [IU] via INTRAVENOUS

## 2023-11-26 MED ORDER — FUROSEMIDE 20 MG PO TABS: 20.0000 mg | ORAL_TABLET | Freq: Two times a day (BID) | ORAL | 0 refills | Status: DC

## 2023-11-26 MED ORDER — IOHEXOL 300 MG/ML  SOLN
100.0000 mL | Freq: Once | INTRAMUSCULAR | Status: AC | PRN
  Administered 2023-11-26: 100 mL via INTRAVENOUS

## 2023-11-26 NOTE — Telephone Encounter (Signed)
S/w pt to advise to go to [facility] main hospital entrance to check in for her CT. She verbalized agreement and will go to have BLE dopplers after.

## 2023-11-26 NOTE — Telephone Encounter (Signed)
I left a voicemail for the patient to tell her that the CT scans do not show any evidence of progression of disease in fact there is some improvement. There is anasarca however. She will continue the diuretic plan as detailed before.

## 2023-11-26 NOTE — Assessment & Plan Note (Signed)
1.  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 bone metastasis: Currently on Xgeva every 3 months.   CT CAP 02/01/2022: Mild progression of disease with increased number and size of the numerous lung nodules in the right lung.  Persistent lymphadenopathy and right hilar and right paratracheal nodal areas, numerous hypovascular hepatic lesions slightly larger when compared to previous.   03/28/2023: CA 27-29: 84.4 04/19/2023: CA 27-29: 126 06/22/2023: CA 27-29: 204 09/13/2023: CA 27-29: 329 11/06/2023: CA 27-29: 124   CT CAP 05/07/2023: Interval increase in size of right hepatic metastases 2.7 cm (to be 1.6 cm) and cluster of lesions in the left hepatic lobe are similar, bone metastases stable   Plan: [facility] molecular testing: ER positive, ER positive, TMB 5, PD-L1 negative, PIK 3 CA negative  -------------------------------------------------------- Current treatment: Cycle 3 Eribulin (to be given 11/27/2023) Chemo toxicities: Profound neutropenia: Previously we could not administer cycle 1 day 8.  She has been getting Neupogen injections on Saturdays prior to chemo. Mouth sores: Encouraged the patient to use salt water gargles.  She did not want Magic mouthwash. Thrombocytopenia: Watching and monitoring Hypokalemia: On potassium 10 mEq daily. Muscle spasms: Could be related to hypokalemia.   Abdominal distention and lower extremity edema:     CT scans after 3 cycles of chemo. The improvement in CA 27-29 is very encouraging

## 2023-11-26 NOTE — Progress Notes (Signed)
Patient Care Team: Julieanne Manson, MD as PCP - General (Internal Medicine) Maisie Fus, MD as PCP - Cardiology (Cardiology) Lonie Peak, MD as Attending Physician (Radiation Oncology) Axel Filler, Larna Daughters, NP as Nurse Practitioner (Hematology and Oncology) Berna Bue, MD as Consulting Physician (General Surgery) Felecia Shelling, DPM as Consulting Physician (Podiatry) Graylin Shiver, MD as Consulting Physician (Gastroenterology) Serena Croissant, MD as Attending Physician (Hematology and Oncology)  DIAGNOSIS:  Encounter Diagnosis  Name Primary?   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) Yes    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)   06/21/2022 - 08/03/2022 Chemotherapy   Patient is on Treatment Plan : BREAST METASTATIC fam-trastuzumab deruxtecan-nxki (Enhertu) q21d     06/21/2022 - 04/19/2023 Chemotherapy   Patient is on Treatment Plan : BREAST METASTATIC Fam-Trastuzumab Deruxtecan-nxki (Enhertu) (5.4) q21d     05/18/2023 - 07/06/2023 Chemotherapy   Patient is on Treatment Plan : BREAST METASTATIC Sacituzumab govitecan-hziy [name]) D1,8 q21d     10/16/2023 -  Chemotherapy   Patient is on Treatment Plan : BREAST METASTATIC Eribulin D1,8 q21d     Malignant neoplasm of upper-outer quadrant of right breast in female, estrogen receptor positive (HCC)  06/21/2016 Initial Diagnosis   Malignant neoplasm of upper-outer quadrant of right breast in female, estrogen receptor positive (HCC)   06/21/2016 Initial Biopsy   Right breast biopsy, 10 oclock: IDC, grade 3, ER+(95%), PR+(80%),Ki67 20%, HER-2 negative (ratio 0.67). Right axilla core biopsy: carcinoma, grade 3, ER+(100%), PR+(90%), Ki67 25%, HER-2 negative (ratio 1.13).    07/17/2016 - 11/06/2016 Neo-Adjuvant Chemotherapy   Received 2 cycles of  Doxorubicin and Cyclophosphamide, then transitioned to weekly Paclitaxel (due to repeated febrile neutropenia) x 7 cycles, stopped early due to peripheral neuropathy, then completed her final 2 cycles of Doxorubicin and Cyclophosphamide.    12/19/2016 Surgery   Right breast lumpectomy ([facility]): IDC, grade 2, 1.6cm and 0.3cm, margins negative, 3 SLN positive for metastatic carcinoma.     12/26/2016 Surgery   ALND: metastatic carcinoma in one of 20 lymph nodes, and three nodes from previous lumpectomy.  Four positive nodes, consistent with pN2a.   02/07/2017 - 03/21/2017 Radiation Therapy   Adjuvant radiation Basilio [place]): 1) Right breast and nodes - 4 field: 50 Gy in 25 fractions. IM NODES: >95% receive at least 45Gy/45fx. 50Gy to SCLV/PAB @ 2Gy /fraction x 25 fractions. 2) Right breast boost: 10 Gy in 5 fractions   04/2017 -  Anti-estrogen oral therapy   Anastrozole 1mg  daily.  Bone density 03/23/2017 finds T score of -2.6, osteoporosis, plan to start Prolia following dental clearance Anastrozole stopped 01/16/18 Exemestane 25 mg daily 02/12/18  On [name], trial randomized to endocrine therapy alone   05/27/2020 Progression   chest CT scan 05/27/2020 shows bulky mediastinal and right hilar lymphadenopathy with right pleural nodules and a small right pleural effusion, no evidence of liver or bone involvement             (a) biopsy of right breast mass 06/02/2020 shows invasive ductal carcinoma, estrogen and progesterone receptor positive, HER-2 not amplified, with an MIB-1 of 40%   06/17/2020 Treatment Plan Change    fulvestrant to start 06/17/2020             (a) palbociclib to start 06/17/2020 at 125 mg daily 21 days on 7 days off             (  b) palbociclib decreased to 125mg  every other day x 11 doses on 07/23/2020             (c) palbociclib dose decreased to100 mg daily, 21/7, starting with September cycle             (d) Palbociclib decreased to 75mg  daily beginning with March cycle due to oral  ulcers             (e) palbociclib discontinued November 2022 with progression   09/14/2021 PET scan   PET scan 09/14/2021 shows progression in liver and bone   10/05/2021 Pathology Results   liver biopsy 10/05/2021 confirms metastatic carcinoma, estrogen receptor strongly positive, progesterone receptor and HER2 negative, with an MIB-1 of 30%.   10/2021 Treatment Plan Change   fulvestrant continued, Xgeva added, palbociclib changed to abemaciclib November 2022 -Dose Decreased to 50mg  PO BID Daily   03/07/2022 Miscellaneous   [facility]is molecular testing: ER positive, ER positive, MTAP: Detected, ESR 1 negative, BRCA 1 and 2 negative, MSI stable, PD-L1 negative, PIK 3 CA negative, PR negative,   06/21/2022 - 08/03/2022 Chemotherapy   Patient is on Treatment Plan : BREAST METASTATIC fam-trastuzumab deruxtecan-nxki (Enhertu) q21d     06/21/2022 - 04/19/2023 Chemotherapy   Patient is on Treatment Plan : BREAST METASTATIC Fam-Trastuzumab Deruxtecan-nxki (Enhertu) (5.4) q21d     01/25/2023 Cancer Staging   Staging form: Breast, AJCC 7th Edition - Pathologic: Stage IV (M1) - Signed by Loa Socks, NP on 01/25/2023   05/18/2023 - 07/06/2023 Chemotherapy   Patient is on Treatment Plan : BREAST METASTATIC Sacituzumab govitecan-hziy [name]) D1,8 q21d     07/23/2023 -  Chemotherapy   Xeloda 1000 mg bid 14 days on and 7 days off   08/27/2023 Procedure   Y90 hepatic radio embolization   10/16/2023 -  Chemotherapy   Patient is on Treatment Plan : BREAST METASTATIC Eribulin D1,8 q21d     Breast cancer, stage 4 (HCC)  03/09/2022 Initial Diagnosis   Breast cancer, stage 4 (HCC)   06/21/2022 - 08/03/2022 Chemotherapy   Patient is on Treatment Plan : BREAST METASTATIC fam-trastuzumab deruxtecan-nxki (Enhertu) q21d     06/21/2022 - 04/19/2023 Chemotherapy   Patient is on Treatment Plan : BREAST METASTATIC Fam-Trastuzumab Deruxtecan-nxki (Enhertu) (5.4) q21d     05/18/2023 - 07/06/2023 Chemotherapy    Patient is on Treatment Plan : BREAST METASTATIC Sacituzumab govitecan-hziy [name]) D1,8 q21d     10/16/2023 -  Chemotherapy   Patient is on Treatment Plan : BREAST METASTATIC Eribulin D1,8 q21d       CHIEF COMPLIANT: New onset of abdominal pain and bilateral lower extremity edema  HISTORY OF PRESENT ILLNESS:   History of Present Illness   The patient presents with new onset bilateral lower extremity edema and abdominal distention since last Thursday. She describes the edema as the worst she has ever experienced, extending up to her thighs and associated with pain. She also notes right upper quadrant abdominal pain, worse with palpation. She denies constipation, reporting regular bowel movements. She has noticed an increase in weight by 10 pounds, peaking at 15 pounds over the weekend. She attributes the weight loss to increased urination after taking herbal supplements, including curcumin and [place] tail mushroom. She also reports dark urine despite increased urination. She has been wearing a sleeve for arm swelling, which she notes is a regular occurrence. She has a history of metastatic breast cancer and is currently on chemotherapy.  ALLERGIES:  is allergic to eribulin mesylate, codeine, cymbalta [duloxetine hcl], hydrocodone, ultram [tramadol hcl], venlafaxine, and gabapentin.  MEDICATIONS:  Current Outpatient Medications  Medication Sig Dispense Refill   furosemide (LASIX) 20 MG tablet Take 1 tablet (20 mg total) by mouth 2 (two) times daily. 60 tablet 0   carvedilol (COREG) 3.125 MG tablet Take 1 tablet (3.125 mg total) by mouth 2 (two) times daily. 180 tablet 3   Cetirizine HCl (ZYRTEC ALLERGY PO) Take 10 mg by mouth daily.     cyclobenzaprine (FLEXERIL) 5 MG tablet Take 1 tablet (5 mg total) by mouth 3 (three) times daily as needed for muscle spasms. 30 tablet 0   Denosumab (XGEVA [place]) Inject 120 mg into the skin every 3 (three) months.     KLOR-CON M10 10 MEQ tablet TAKE  1 TABLET BY MOUTH EVERY DAY 90 tablet 1   levofloxacin (LEVAQUIN) 750 MG tablet Take 1 tablet (750 mg total) by mouth daily. 7 tablet 0   lidocaine-prilocaine (EMLA) cream Apply to affected area once 30 g 3   naproxen sodium (ALEVE) 220 MG tablet Take 440-660 mg by mouth 2 (two) times daily as needed (pain).     omeprazole (PRILOSEC) 20 MG capsule Take 20 mg by mouth daily.     ondansetron (ZOFRAN) 8 MG tablet as needed for nausea or vomiting.     ondansetron (ZOFRAN) 8 MG tablet Take 1 tablet (8 mg total) by mouth every 8 (eight) hours as needed for nausea or vomiting. 30 tablet 1   prochlorperazine (COMPAZINE) 10 MG tablet Take 1 tablet (10 mg total) by mouth every 6 (six) hours as needed for nausea or vomiting. 30 tablet 1   rizatriptan (MAXALT) 5 MG tablet Take 1 tablet (5 mg total) by mouth as needed for migraine. May repeat in 2 hours if needed 10 tablet 0   valACYclovir (VALTREX) 1000 MG tablet Take 1 tablet (1,000 mg total) by mouth 2 (two) times daily. 1/2 tab BID (Patient taking differently: Take 500 mg by mouth See admin instructions. Take 500 mg daily, may take a second 500 mg dose as needed for outbreaks) 180 tablet 4   No current facility-administered medications for this visit.    PHYSICAL EXAMINATION: ECOG PERFORMANCE STATUS: 1 - Symptomatic but completely ambulatory  Vitals:   11/26/23 0852  BP: 114/67  Pulse: 99  Resp: 18  Temp: 97.9 F (36.6 C)  SpO2: 100%   Filed Weights   11/26/23 0852  Weight: 225 lb 4.8 oz (102.2 kg)    Physical Exam   MEASUREMENTS: WT- 225 pounds ABDOMEN: Tenderness in the right lower quadrant, no fluid wave elicited, no hepatomegaly on palpation EXTREMITIES: Bilateral lower extremity edema        LABORATORY DATA:  I have reviewed the data as listed    Latest Ref Rng & Units 11/13/2023    8:49 AM 11/06/2023    1:16 PM 10/23/2023    1:50 PM  CMP  Glucose 70 - 99 mg/dL 96  409  811   BUN 8 - 23 mg/dL 7  9  6    Creatinine 0.44 -  1.00 mg/dL 9.14  7.82  9.56   Sodium 135 - 145 mmol/L 141  139  137   Potassium 3.5 - 5.1 mmol/L 3.1  3.5  2.8   Chloride 98 - 111 mmol/L 110  110  107   CO2 22 - 32 mmol/L 24  25  26    Calcium 8.9 - 10.3 mg/dL  8.0  8.1  8.4   Total Protein 6.5 - 8.1 g/dL 5.7  5.6  6.0   Total Bilirubin <1.2 mg/dL 2.0  1.4  0.9   Alkaline Phos 38 - 126 U/L 330  302  219   AST 15 - 41 U/L 55  53  56   ALT 0 - 44 U/L 23  21  30      Lab Results  Component Value Date   WBC 2.9 (L) 11/26/2023   HGB 11.5 (L) 11/26/2023   HCT 33.3 (L) 11/26/2023   MCV 98.8 11/26/2023   PLT 126 (L) 11/26/2023   NEUTROABS 1.9 11/26/2023    ASSESSMENT & PLAN:  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) 1.  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 bone metastasis: Currently on Xgeva every 3 months.   CT CAP 02/01/2022: Mild progression of disease with increased number and size of the numerous lung nodules in the right lung.  Persistent lymphadenopathy and right hilar and right paratracheal nodal areas, numerous hypovascular hepatic lesions slightly larger when compared to previous.   03/28/2023: CA 27-29: 84.4 04/19/2023: CA 27-29: 126 06/22/2023: CA 27-29: 204 09/13/2023: CA 27-29: 329 11/06/2023: CA 27-29: 124   CT CAP 05/07/2023: Interval increase in size of right hepatic metastases 2.7 cm (to be 1.6 cm) and cluster of lesions in the left hepatic lobe are similar, bone metastases stable   Plan: Caris molecular testing: ER positive, ER positive, TMB 5, PD-L1 negative, PIK 3 CA negative  -------------------------------------------------------- Current treatment: Cycle 3 Eribulin (to be given 11/27/2023) Chemo toxicities: Profound neutropenia: Previously we could not administer cycle 1 day 8.  She has been getting Neupogen injections on Saturdays prior to chemo. Mouth sores: Encouraged  the patient to use salt water gargles.  She did not want Magic mouthwash. Thrombocytopenia: Watching and monitoring Hypokalemia: On potassium 10 mEq daily. Muscle spasms: Could be related to hypokalemia.   Abdominal distention and lower extremity edema: I would like to get ultrasound of lower extremities as well as obtain CT scans of the abdomen.  In fact she has orders for scans to be done in January and we will try to see if it can be moved up.    The improvement in CA 27-29 is very encouraging ------------------------------------- Assessment and Plan    Edema and Abdominal Distention New onset bilateral lower extremity edema and abdominal distention with associated right upper quadrant pain. Bowel movements are regular. No ascites on physical exam. Differential includes fluid overload, liver dysfunction, renal dysfunction, low protein, and venous thromboembolism. -Order lower extremity ultrasound to rule out deep vein thrombosis. -Start Furosemide (Lasix) twice daily to promote diuresis and reduce edema. -Check comprehensive metabolic panel to assess liver and kidney function and protein levels. -Attempt to expedite previously scheduled abdominal CT scan to further evaluate abdominal distention and pain.  Metastatic Breast Cancer Patient reports decrease in size of palpable mass. Current treatment appears effective. -Continue current treatment regimen. -Ensure complete blood count is within acceptable range for ongoing treatment.  General Health Maintenance -Encourage consistent hydration to prevent dark urine.          No orders of the defined types were placed in this encounter.  The patient has a good understanding of the overall plan. she agrees with it. she will call with any problems that may develop before the next visit here. Total time spent: 30 mins including face  to face time and time spent for planning, charting and co-ordination of care   Tamsen Meek,  MD 11/26/23

## 2023-11-27 ENCOUNTER — Inpatient Hospital Stay: Payer: Medicaid Other

## 2023-11-27 ENCOUNTER — Other Ambulatory Visit: Payer: Medicaid Other

## 2023-11-27 ENCOUNTER — Ambulatory Visit: Payer: Medicaid Other | Admitting: Hematology and Oncology

## 2023-11-27 VITALS — BP 124/75 | HR 98 | Temp 98.3°F | Resp 18 | Wt 222.0 lb

## 2023-11-27 DIAGNOSIS — Z5111 Encounter for antineoplastic chemotherapy: Secondary | ICD-10-CM | POA: Diagnosis not present

## 2023-11-27 DIAGNOSIS — C50911 Malignant neoplasm of unspecified site of right female breast: Secondary | ICD-10-CM

## 2023-11-27 DIAGNOSIS — C50919 Malignant neoplasm of unspecified site of unspecified female breast: Secondary | ICD-10-CM

## 2023-11-27 DIAGNOSIS — C50411 Malignant neoplasm of upper-outer quadrant of right female breast: Secondary | ICD-10-CM

## 2023-11-27 MED ORDER — METHYLPREDNISOLONE SODIUM SUCC 125 MG IJ SOLR
60.0000 mg | Freq: Once | INTRAMUSCULAR | Status: AC
  Administered 2023-11-27: 60 mg via INTRAVENOUS
  Filled 2023-11-27: qty 2

## 2023-11-27 MED ORDER — DENOSUMAB 120 MG/1.7ML ~~LOC~~ SOLN
120.0000 mg | Freq: Once | SUBCUTANEOUS | Status: AC
  Administered 2023-11-27: 120 mg via SUBCUTANEOUS
  Filled 2023-11-27: qty 1.7

## 2023-11-27 MED ORDER — HEPARIN SOD (PORK) LOCK FLUSH 100 UNIT/ML IV SOLN
500.0000 [IU] | Freq: Once | INTRAVENOUS | Status: AC | PRN
  Administered 2023-11-27: 500 [IU]

## 2023-11-27 MED ORDER — CETIRIZINE HCL 10 MG/ML IV SOLN
10.0000 mg | Freq: Once | INTRAVENOUS | Status: AC
  Administered 2023-11-27: 10 mg via INTRAVENOUS
  Filled 2023-11-27: qty 1

## 2023-11-27 MED ORDER — SODIUM CHLORIDE 0.9% FLUSH
10.0000 mL | INTRAVENOUS | Status: DC | PRN
  Administered 2023-11-27: 10 mL

## 2023-11-27 MED ORDER — SODIUM CHLORIDE 0.9 % IV SOLN
1.0000 mg/m2 | Freq: Once | INTRAVENOUS | Status: AC
  Administered 2023-11-27: 2 mg via INTRAVENOUS
  Filled 2023-11-27: qty 4

## 2023-11-27 MED ORDER — PROCHLORPERAZINE MALEATE 10 MG PO TABS
10.0000 mg | ORAL_TABLET | Freq: Once | ORAL | Status: AC
  Administered 2023-11-27: 10 mg via ORAL
  Filled 2023-11-27: qty 1

## 2023-11-27 MED ORDER — SODIUM CHLORIDE 0.9 % IV SOLN
INTRAVENOUS | Status: DC
Start: 2023-11-27 — End: 2023-11-27

## 2023-11-27 MED ORDER — FAMOTIDINE IN NACL 20-0.9 MG/50ML-% IV SOLN
20.0000 mg | Freq: Once | INTRAVENOUS | Status: AC
  Administered 2023-11-27: 20 mg via INTRAVENOUS
  Filled 2023-11-27: qty 50

## 2023-11-27 NOTE — Progress Notes (Signed)
Per Dr. Candise Che, OK to treat with total bilirubin 2.3; OK to administer Xgeva with corrected calcium 9.12.

## 2023-11-27 NOTE — Patient Instructions (Signed)
[facility] - A DEPT OF [facility]  Discharge Instructions: Thank you for choosing [facility] to provide your oncology and hematology care.   If you have a lab appointment with the Cancer Center, please go directly to the Cancer Center and check in at the registration area.   Wear comfortable clothing and clothing appropriate for easy access to any Portacath or PICC line.   We strive to give you quality time with your provider. You may need to reschedule your appointment if you arrive late (15 or more minutes).  Arriving late affects you and other patients whose appointments are after yours.  Also, if you miss three or more appointments without notifying the office, you may be dismissed from the clinic at the provider's discretion.      For prescription refill requests, have your pharmacy contact our office and allow 72 hours for refills to be completed.    Today you received the following chemotherapy and/or immunotherapy agents: [place]      To help prevent nausea and vomiting after your treatment, we encourage you to take your nausea medication as directed.  BELOW ARE SYMPTOMS THAT SHOULD BE REPORTED IMMEDIATELY: *FEVER GREATER THAN 100.4 F (38 C) OR HIGHER *CHILLS OR SWEATING *NAUSEA AND VOMITING THAT IS NOT CONTROLLED WITH YOUR NAUSEA MEDICATION *UNUSUAL SHORTNESS OF BREATH *UNUSUAL BRUISING OR BLEEDING *URINARY PROBLEMS (pain or burning when urinating, or frequent urination) *BOWEL PROBLEMS (unusual diarrhea, constipation, pain near the anus) TENDERNESS IN MOUTH AND THROAT WITH OR WITHOUT PRESENCE OF ULCERS (sore throat, sores in mouth, or a toothache) UNUSUAL RASH, SWELLING OR PAIN  UNUSUAL VAGINAL DISCHARGE OR ITCHING   Items with * indicate a potential emergency and should be followed up as soon as possible or go to the Emergency Department if any problems should occur.  Please show the CHEMOTHERAPY ALERT CARD or IMMUNOTHERAPY  ALERT CARD at check-in to the Emergency Department and triage nurse.  Should you have questions after your visit or need to cancel or reschedule your appointment, please contact [facility] - A DEPT OF [facility]  Dept: [phone]  and follow the prompts.  Office hours are 8:00 a.m. to 4:30 p.m. Monday - Friday. Please note that voicemails left after 4:00 p.m. may not be returned until the following business day.  We are closed weekends and major holidays. You have access to a nurse at all times for urgent questions. Please call the main number to the clinic Dept: [phone] and follow the prompts.   For any non-urgent questions, you may also contact your provider using MyChart. We now offer e-Visits for anyone 74 and older to request care online for non-urgent symptoms. For details visit [email or web address].   Also download the MyChart app! Go to the app store, search "MyChart", open the app, select [facility], and log in with your MyChart username and password.  Denosumab Injection (Oncology) What is this medication? DENOSUMAB (den oh SUE mab) prevents weakened bones caused by cancer. It may also be used to treat noncancerous bone tumors that cannot be removed by surgery. It can also be used to treat high calcium levels in the blood caused by cancer. It works by blocking a protein that causes bones to break down quickly. This slows down the release of calcium from bones, which lowers calcium levels in your blood. It also makes your bones stronger and less likely to break (fracture). This  medicine may be used for other purposes; ask your health care provider or pharmacist if you have questions. COMMON BRAND NAME(S): XGEVA What should I tell my care team before I take this medication? They need to know if you have any of these conditions: Dental disease Having surgery or tooth extraction Infection Kidney disease Low levels of calcium or vitamin D in the  blood Malnutrition On hemodialysis Skin conditions or sensitivity Thyroid or parathyroid disease An unusual reaction to denosumab, other medications, foods, dyes, or preservatives Pregnant or trying to get pregnant Breast-feeding How should I use this medication? This medication is for injection under the skin. It is given by your care team in a hospital or clinic setting. A special MedGuide will be given to you before each treatment. Be sure to read this information carefully each time. Talk to your care team about the use of this medication in children. While it may be prescribed for children as young as 13 years for selected conditions, precautions do apply. Overdosage: If you think you have taken too much of this medicine contact a poison control center or emergency room at once. NOTE: This medicine is only for you. Do not share this medicine with others. What if I miss a dose? Keep appointments for follow-up doses. It is important not to miss your dose. Call your care team if you are unable to keep an appointment. What may interact with this medication? Do not take this medication with any of the following: Other medications containing denosumab This medication may also interact with the following: Medications that lower your chance of fighting infection Steroid medications, such as prednisone or cortisone This list may not describe all possible interactions. Give your health care provider a list of all the medicines, herbs, non-prescription drugs, or dietary supplements you use. Also tell them if you smoke, drink alcohol, or use illegal drugs. Some items may interact with your medicine. What should I watch for while using this medication? Your condition will be monitored carefully while you are receiving this medication. You may need blood work while taking this medication. This medication may increase your risk of getting an infection. Call your care team for advice if you get a  fever, chills, sore throat, or other symptoms of a cold or flu. Do not treat yourself. Try to avoid being around people who are sick. You should make sure you get enough calcium and vitamin D while you are taking this medication, unless your care team tells you not to. Discuss the foods you eat and the vitamins you take with your care team. Some people who take this medication have severe bone, joint, or muscle pain. This medication may also increase your risk for jaw problems or a broken thigh bone. Tell your care team right away if you have severe pain in your jaw, bones, joints, or muscles. Tell your care team if you have any pain that does not go away or that gets worse. Talk to your care team if you may be pregnant. Serious birth defects can occur if you take this medication during pregnancy and for 5 months after the last dose. You will need a negative pregnancy test before starting this medication. Contraception is recommended while taking this medication and for 5 months after the last dose. Your care team can help you find the option that works for you. What side effects may I notice from receiving this medication? Side effects that you should report to your care team as soon as  possible: Allergic reactions--skin rash, itching, hives, swelling of the face, lips, tongue, or throat Bone, joint, or muscle pain Low calcium level--muscle pain or cramps, confusion, tingling, or numbness in the hands or feet Osteonecrosis of the jaw--pain, swelling, or redness in the mouth, numbness of the jaw, poor healing after dental work, unusual discharge from the mouth, visible bones in the mouth Side effects that usually do not require medical attention (report to your care team if they continue or are bothersome): Cough Diarrhea Fatigue Headache Nausea This list may not describe all possible side effects. Call your doctor for medical advice about side effects. You may report side effects to FDA at  1-800-FDA-1088. Where should I keep my medication? This medication is given in a hospital or clinic. It will not be stored at home. NOTE: This sheet is a summary. It may not cover all possible information. If you have questions about this medicine, talk to your doctor, pharmacist, or health care provider.  2024 Elsevier/Gold Standard (2022-04-12 00:00:00)

## 2023-12-01 ENCOUNTER — Inpatient Hospital Stay: Payer: Medicaid Other

## 2023-12-04 ENCOUNTER — Inpatient Hospital Stay: Payer: Medicaid Other | Admitting: Adult Health

## 2023-12-04 ENCOUNTER — Encounter: Payer: Self-pay | Admitting: Hematology and Oncology

## 2023-12-04 ENCOUNTER — Inpatient Hospital Stay: Payer: Medicaid Other

## 2023-12-10 ENCOUNTER — Ambulatory Visit (HOSPITAL_COMMUNITY): Payer: Medicaid Other

## 2023-12-10 ENCOUNTER — Other Ambulatory Visit (HOSPITAL_COMMUNITY): Payer: Medicaid Other

## 2023-12-12 NOTE — Telephone Encounter (Signed)
 Per secured chat nurse Lowella Bandy reschedule appt to Friday due to bad weather expected. Patient confirmed and agreed to change.

## 2023-12-14 ENCOUNTER — Inpatient Hospital Stay: Payer: Medicaid Other | Attending: Oncology

## 2023-12-14 ENCOUNTER — Inpatient Hospital Stay: Payer: Medicaid Other

## 2023-12-14 ENCOUNTER — Encounter: Payer: Self-pay | Admitting: Hematology and Oncology

## 2023-12-14 VITALS — BP 102/73 | HR 99 | Resp 18

## 2023-12-14 DIAGNOSIS — C50911 Malignant neoplasm of unspecified site of right female breast: Secondary | ICD-10-CM

## 2023-12-14 DIAGNOSIS — C7951 Secondary malignant neoplasm of bone: Secondary | ICD-10-CM | POA: Insufficient documentation

## 2023-12-14 DIAGNOSIS — E876 Hypokalemia: Secondary | ICD-10-CM | POA: Insufficient documentation

## 2023-12-14 DIAGNOSIS — Z923 Personal history of irradiation: Secondary | ICD-10-CM | POA: Insufficient documentation

## 2023-12-14 DIAGNOSIS — C773 Secondary and unspecified malignant neoplasm of axilla and upper limb lymph nodes: Secondary | ICD-10-CM | POA: Diagnosis not present

## 2023-12-14 DIAGNOSIS — K1379 Other lesions of oral mucosa: Secondary | ICD-10-CM | POA: Diagnosis not present

## 2023-12-14 DIAGNOSIS — M810 Age-related osteoporosis without current pathological fracture: Secondary | ICD-10-CM | POA: Diagnosis not present

## 2023-12-14 DIAGNOSIS — Z5111 Encounter for antineoplastic chemotherapy: Secondary | ICD-10-CM | POA: Diagnosis present

## 2023-12-14 DIAGNOSIS — Z79899 Other long term (current) drug therapy: Secondary | ICD-10-CM | POA: Insufficient documentation

## 2023-12-14 DIAGNOSIS — R140 Abdominal distension (gaseous): Secondary | ICD-10-CM | POA: Diagnosis not present

## 2023-12-14 DIAGNOSIS — C50411 Malignant neoplasm of upper-outer quadrant of right female breast: Secondary | ICD-10-CM | POA: Diagnosis not present

## 2023-12-14 DIAGNOSIS — D709 Neutropenia, unspecified: Secondary | ICD-10-CM | POA: Insufficient documentation

## 2023-12-14 DIAGNOSIS — Z79811 Long term (current) use of aromatase inhibitors: Secondary | ICD-10-CM | POA: Diagnosis not present

## 2023-12-14 DIAGNOSIS — C787 Secondary malignant neoplasm of liver and intrahepatic bile duct: Secondary | ICD-10-CM | POA: Diagnosis not present

## 2023-12-14 DIAGNOSIS — R600 Localized edema: Secondary | ICD-10-CM | POA: Insufficient documentation

## 2023-12-14 DIAGNOSIS — M62838 Other muscle spasm: Secondary | ICD-10-CM | POA: Diagnosis not present

## 2023-12-14 DIAGNOSIS — R5383 Other fatigue: Secondary | ICD-10-CM | POA: Diagnosis not present

## 2023-12-14 DIAGNOSIS — Z79624 Long term (current) use of inhibitors of nucleotide synthesis: Secondary | ICD-10-CM | POA: Diagnosis not present

## 2023-12-14 DIAGNOSIS — C50919 Malignant neoplasm of unspecified site of unspecified female breast: Secondary | ICD-10-CM

## 2023-12-14 DIAGNOSIS — Z170 Estrogen receptor positive status [ER+]: Secondary | ICD-10-CM

## 2023-12-14 DIAGNOSIS — D696 Thrombocytopenia, unspecified: Secondary | ICD-10-CM | POA: Insufficient documentation

## 2023-12-14 DIAGNOSIS — Z9221 Personal history of antineoplastic chemotherapy: Secondary | ICD-10-CM | POA: Diagnosis not present

## 2023-12-14 MED ORDER — FILGRASTIM-SNDZ 300 MCG/0.5ML IJ SOSY
300.0000 ug | PREFILLED_SYRINGE | Freq: Once | INTRAMUSCULAR | Status: AC
Start: 2023-12-14 — End: 2023-12-14
  Administered 2023-12-14: 300 ug via SUBCUTANEOUS

## 2023-12-15 ENCOUNTER — Ambulatory Visit: Payer: Medicaid Other

## 2023-12-15 ENCOUNTER — Inpatient Hospital Stay: Payer: Medicaid Other

## 2023-12-17 ENCOUNTER — Encounter: Payer: Self-pay | Admitting: Hematology and Oncology

## 2023-12-17 ENCOUNTER — Inpatient Hospital Stay: Payer: Medicaid Other

## 2023-12-17 ENCOUNTER — Inpatient Hospital Stay (HOSPITAL_BASED_OUTPATIENT_CLINIC_OR_DEPARTMENT_OTHER): Payer: Medicaid Other | Admitting: Hematology and Oncology

## 2023-12-17 VITALS — BP 105/64 | HR 89 | Temp 97.9°F | Resp 18 | Ht 64.0 in | Wt 220.5 lb

## 2023-12-17 DIAGNOSIS — Z170 Estrogen receptor positive status [ER+]: Secondary | ICD-10-CM

## 2023-12-17 DIAGNOSIS — Z5111 Encounter for antineoplastic chemotherapy: Secondary | ICD-10-CM | POA: Diagnosis not present

## 2023-12-17 DIAGNOSIS — C50911 Malignant neoplasm of unspecified site of right female breast: Secondary | ICD-10-CM

## 2023-12-17 DIAGNOSIS — C773 Secondary and unspecified malignant neoplasm of axilla and upper limb lymph nodes: Secondary | ICD-10-CM

## 2023-12-17 DIAGNOSIS — C50919 Malignant neoplasm of unspecified site of unspecified female breast: Secondary | ICD-10-CM

## 2023-12-17 LAB — CMP (CANCER CENTER ONLY)
ALT: 20 U/L (ref 0–44)
AST: 47 U/L — ABNORMAL HIGH (ref 15–41)
Albumin: 2.5 g/dL — ABNORMAL LOW (ref 3.5–5.0)
Alkaline Phosphatase: 266 U/L — ABNORMAL HIGH (ref 38–126)
Anion gap: 3 — ABNORMAL LOW (ref 5–15)
BUN: 10 mg/dL (ref 8–23)
CO2: 27 mmol/L (ref 22–32)
Calcium: 8.7 mg/dL — ABNORMAL LOW (ref 8.9–10.3)
Chloride: 109 mmol/L (ref 98–111)
Creatinine: 0.69 mg/dL (ref 0.44–1.00)
GFR, Estimated: >60 mL/min (ref >60)
Glucose, Bld: 83 mg/dL (ref 70–99)
Potassium: 3.8 mmol/L (ref 3.5–5.1)
Sodium: 139 mmol/L (ref 135–145)
Total Bilirubin: 2 mg/dL — ABNORMAL HIGH (ref 0.0–1.2)
Total Protein: 5.2 g/dL — ABNORMAL LOW (ref 6.5–8.1)

## 2023-12-17 LAB — CBC WITH DIFFERENTIAL (CANCER CENTER ONLY)
Abs Immature Granulocytes: 0.01 10*3/uL (ref 0.00–0.07)
Basophils Absolute: 0 10*3/uL (ref 0.0–0.1)
Basophils Relative: 0 %
Eosinophils Absolute: 0 10*3/uL (ref 0.0–0.5)
Eosinophils Relative: 0 %
HCT: 33.3 % — ABNORMAL LOW (ref 36.0–46.0)
Hemoglobin: 11.2 g/dL — ABNORMAL LOW (ref 12.0–15.0)
Immature Granulocytes: 0 %
Lymphocytes Relative: 8 %
Lymphs Abs: 0.3 10*3/uL — ABNORMAL LOW (ref 0.7–4.0)
MCH: 34.3 pg — ABNORMAL HIGH (ref 26.0–34.0)
MCHC: 33.6 g/dL (ref 30.0–36.0)
MCV: 101.8 fL — ABNORMAL HIGH (ref 80.0–100.0)
Monocytes Absolute: 0.5 10*3/uL (ref 0.1–1.0)
Monocytes Relative: 14 %
Neutro Abs: 2.8 10*3/uL (ref 1.7–7.7)
Neutrophils Relative %: 78 %
Platelet Count: 134 10*3/uL — ABNORMAL LOW (ref 150–400)
RBC: 3.27 MIL/uL — ABNORMAL LOW (ref 3.87–5.11)
RDW: 21.8 % — ABNORMAL HIGH (ref 11.5–15.5)
WBC Count: 3.6 10*3/uL — ABNORMAL LOW (ref 4.0–10.5)
nRBC: 0 % (ref 0.0–0.2)

## 2023-12-17 LAB — MAGNESIUM: Magnesium: 1.8 mg/dL (ref 1.7–2.4)

## 2023-12-17 MED ORDER — CETIRIZINE HCL 10 MG/ML IV SOLN
10.0000 mg | Freq: Once | INTRAVENOUS | Status: AC
  Administered 2023-12-17: 10 mg via INTRAVENOUS
  Filled 2023-12-17: qty 1

## 2023-12-17 MED ORDER — SODIUM CHLORIDE 0.9% FLUSH
10.0000 mL | INTRAVENOUS | Status: DC | PRN
  Administered 2023-12-17: 10 mL

## 2023-12-17 MED ORDER — FAMOTIDINE IN NACL 20-0.9 MG/50ML-% IV SOLN
20.0000 mg | Freq: Once | INTRAVENOUS | Status: AC
Start: 2023-12-17 — End: 2023-12-17
  Administered 2023-12-17: 20 mg via INTRAVENOUS
  Filled 2023-12-17: qty 50

## 2023-12-17 MED ORDER — PROCHLORPERAZINE MALEATE 10 MG PO TABS
10.0000 mg | ORAL_TABLET | Freq: Once | ORAL | Status: AC
  Administered 2023-12-17: 10 mg via ORAL
  Filled 2023-12-17: qty 1

## 2023-12-17 MED ORDER — HEPARIN SOD (PORK) LOCK FLUSH 100 UNIT/ML IV SOLN
500.0000 [IU] | Freq: Once | INTRAVENOUS | Status: AC | PRN
  Administered 2023-12-17: 500 [IU]

## 2023-12-17 MED ORDER — ERIBULIN MESYLATE CHEMO INJECTION 1 MG/2ML
1.0000 mg/m2 | Freq: Once | INTRAVENOUS | Status: AC
  Administered 2023-12-17: 2 mg via INTRAVENOUS
  Filled 2023-12-17: qty 4

## 2023-12-17 MED ORDER — METHYLPREDNISOLONE SODIUM SUCC 125 MG IJ SOLR
60.0000 mg | Freq: Once | INTRAMUSCULAR | Status: AC
  Administered 2023-12-17: 60 mg via INTRAVENOUS
  Filled 2023-12-17: qty 2

## 2023-12-17 MED ORDER — SODIUM CHLORIDE 0.9 % IV SOLN
INTRAVENOUS | Status: DC
Start: 2023-12-17 — End: 2023-12-17

## 2023-12-17 MED ORDER — SODIUM CHLORIDE 0.9% FLUSH
10.0000 mL | Freq: Once | INTRAVENOUS | Status: AC
  Administered 2023-12-17: 10 mL

## 2023-12-17 NOTE — Progress Notes (Signed)
 Per Dr Pamelia Hoit ok to proceed with tx today with bilirubin 2.0

## 2023-12-17 NOTE — Assessment & Plan Note (Signed)
 1.  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 bone metastasis: Currently on Xgeva  every 3 months.   CT CAP 02/01/2022: Mild progression of disease with increased number and size of the numerous lung nodules in the right lung.  Persistent lymphadenopathy and right hilar and right paratracheal nodal areas, numerous hypovascular hepatic lesions slightly larger when compared to previous.   03/28/2023: CA 27-29: 84.4 04/19/2023: CA 27-29: 126 06/22/2023: CA 27-29: 204 09/13/2023: CA 27-29: 329 11/06/2023: CA 27-29: 124   CT CAP 05/07/2023: Interval increase in size of right hepatic metastases 2.7 cm (to be 1.6 cm) and cluster of lesions in the left hepatic lobe are similar, bone metastases stable   Plan: [facility] molecular testing: ER positive, ER positive, TMB 5, PD-L1 negative, PIK 3 CA negative  -------------------------------------------------------- Current treatment: Cycle 4 Eribulin  (to be given 11/27/2023) Chemo toxicities: Profound neutropenia: Previously we could not administer cycle 1 day 8.  She has been getting Neupogen  injections on Saturdays prior to chemo. Mouth sores: Encouraged the patient to use salt water gargles.  She did not want Magic mouthwash. Thrombocytopenia: Watching and monitoring Hypokalemia: On potassium 10 mEq daily. Muscle spasms: Could be related to hypokalemia.   Abdominal distention and lower extremity edema: I would like to get ultrasound of lower extremities as well as obtain CT scans of the abdomen.  In fact she has orders for scans to be done in January and we will try to see if it can be moved up. CT CAP 11/26/2023: Diminished size of the liver metastases consistent with treatment response.  Changed bone metastases conspicuously of L2, increase anasarca  Continue current treatment   The improvement in CA 27-29 is very encouraging

## 2023-12-17 NOTE — Progress Notes (Signed)
 Patient Care Team: Adella Norris, MD as PCP - General (Internal Medicine) Alvan Ronal BRAVO, MD as PCP - Cardiology (Cardiology) Izell Domino, MD as Attending Physician (Radiation Oncology) Crawford, Morna Pickle, NP as Nurse Practitioner (Hematology and Oncology) Signe Mitzie LABOR, MD as Consulting Physician (General Surgery) Janit Thresa HERO, DPM as Consulting Physician (Podiatry) Lennard Lesta FALCON, MD as Consulting Physician (Gastroenterology) Odean Potts, MD as Attending Physician (Hematology and Oncology)  DIAGNOSIS:  Encounter Diagnosis  Name Primary?   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) Yes    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)   06/21/2022 - 08/03/2022 Chemotherapy   Patient is on Treatment Plan : BREAST METASTATIC fam-trastuzumab deruxtecan-nxki  (Enhertu ) q21d     06/21/2022 - 04/19/2023 Chemotherapy   Patient is on Treatment Plan : BREAST METASTATIC Fam-Trastuzumab Deruxtecan-nxki  (Enhertu ) (5.4) q21d     05/18/2023 - 07/06/2023 Chemotherapy   Patient is on Treatment Plan : BREAST METASTATIC Sacituzumab govitecan -hziy (Trodelvy ) D1,8 q21d     10/16/2023 -  Chemotherapy   Patient is on Treatment Plan : BREAST METASTATIC Eribulin  D1,8 q21d     Malignant neoplasm of upper-outer quadrant of right breast in female, estrogen receptor positive (HCC)  06/21/2016 Initial Diagnosis   Malignant neoplasm of upper-outer quadrant of right breast in female, estrogen receptor positive (HCC)   06/21/2016 Initial Biopsy   Right breast biopsy, 10 oclock: IDC, grade 3, ER+(95%), PR+(80%),Ki67 20%, HER-2 negative (ratio 0.67). Right axilla core biopsy: carcinoma, grade 3, ER+(100%), PR+(90%), Ki67 25%, HER-2 negative (ratio 1.13).    07/17/2016 - 11/06/2016 Neo-Adjuvant Chemotherapy   Received 2 cycles of  Doxorubicin  and Cyclophosphamide , then transitioned to weekly Paclitaxel  (due to repeated febrile neutropenia) x 7 cycles, stopped early due to peripheral neuropathy, then completed her final 2 cycles of Doxorubicin  and Cyclophosphamide .    12/19/2016 Surgery   Right breast lumpectomy ([facility]): IDC, grade 2, 1.6cm and 0.3cm, margins negative, 3 SLN positive for metastatic carcinoma.     12/26/2016 Surgery   ALND: metastatic carcinoma in one of 20 lymph nodes, and three nodes from previous lumpectomy.  Four positive nodes, consistent with pN2a.   02/07/2017 - 03/21/2017 Radiation Therapy   Adjuvant radiation [facility]): 1) Right breast and nodes - 4 field: 50 Gy in 25 fractions. IM NODES: >95% receive at least 45Gy/21fx. 50Gy to SCLV/PAB @ 2Gy /fraction x 25 fractions. 2) Right breast boost: 10 Gy in 5 fractions   04/2017 -  Anti-estrogen oral therapy   Anastrozole  1mg  daily.  Bone density 03/23/2017 finds T score of -2.6, osteoporosis, plan to start Prolia  following dental clearance Anastrozole  stopped 01/16/18 Exemestane  25 mg daily 02/12/18  On [name], trial randomized to endocrine therapy alone   05/27/2020 Progression   chest CT scan 05/27/2020 shows bulky mediastinal and right hilar lymphadenopathy with right pleural nodules and a small right pleural effusion, no evidence of liver or bone involvement             (a) biopsy of right breast mass 06/02/2020 shows invasive ductal carcinoma, estrogen and progesterone receptor positive, HER-2 not amplified, with an MIB-1 of 40%   06/17/2020 Treatment Plan Change    fulvestrant  to start 06/17/2020             (a) palbociclib  to start 06/17/2020 at 125 mg daily 21 days on 7 days off             (  b) palbociclib  decreased to 125mg  every other day x 11 doses on 07/23/2020             (c) palbociclib  dose decreased to100 mg daily, 21/7, starting with September cycle             (d) Palbociclib  decreased to 75mg  daily beginning with March cycle due to oral  ulcers             (e) palbociclib  discontinued November 2022 with progression   09/14/2021 PET scan   PET scan 09/14/2021 shows progression in liver and bone   10/05/2021 Pathology Results   liver biopsy 10/05/2021 confirms metastatic carcinoma, estrogen receptor strongly positive, progesterone receptor and HER2 negative, with an MIB-1 of 30%.   10/2021 Treatment Plan Change   fulvestrant  continued, Xgeva  added, palbociclib  changed to abemaciclib  November 2022 -Dose Decreased to 50mg  PO BID Daily   03/07/2022 Miscellaneous   [facility] molecular testing: ER positive, ER positive, MTAP: Detected, ESR 1 negative, BRCA 1 and 2 negative, MSI stable, PD-L1 negative, PIK 3 CA negative, PR negative,   06/21/2022 - 08/03/2022 Chemotherapy   Patient is on Treatment Plan : BREAST METASTATIC fam-trastuzumab deruxtecan-nxki  (Enhertu ) q21d     06/21/2022 - 04/19/2023 Chemotherapy   Patient is on Treatment Plan : BREAST METASTATIC Fam-Trastuzumab Deruxtecan-nxki  (Enhertu ) (5.4) q21d     01/25/2023 Cancer Staging   Staging form: Breast, AJCC 7th Edition - Pathologic: Stage IV (M1) - Signed by Crawford Morna Pickle, NP on 01/25/2023   05/18/2023 - 07/06/2023 Chemotherapy   Patient is on Treatment Plan : BREAST METASTATIC Sacituzumab govitecan -hziy (Trodelvy ) D1,8 q21d     07/23/2023 -  Chemotherapy   Xeloda  1000 mg bid 14 days on and 7 days off   08/27/2023 Procedure   Y90 hepatic radio embolization   10/16/2023 -  Chemotherapy   Patient is on Treatment Plan : BREAST METASTATIC Eribulin  D1,8 q21d     Breast cancer, stage 4 (HCC)  03/09/2022 Initial Diagnosis   Breast cancer, stage 4 (HCC)   06/21/2022 - 08/03/2022 Chemotherapy   Patient is on Treatment Plan : BREAST METASTATIC fam-trastuzumab deruxtecan-nxki  (Enhertu ) q21d     06/21/2022 - 04/19/2023 Chemotherapy   Patient is on Treatment Plan : BREAST METASTATIC Fam-Trastuzumab Deruxtecan-nxki  (Enhertu ) (5.4) q21d     05/18/2023 - 07/06/2023 Chemotherapy    Patient is on Treatment Plan : BREAST METASTATIC Sacituzumab govitecan -hziy (Trodelvy ) D1,8 q21d     10/16/2023 -  Chemotherapy   Patient is on Treatment Plan : BREAST METASTATIC Eribulin  D1,8 q21d       CHIEF COMPLIANT: Cycle 4 eribulin   HISTORY OF PRESENT ILLNESS: History of Present Illness   The patient, with a history of liver metastases, reports a significant reduction in the size of the metastases, from 3.7 to 2.2, possibly indicating a positive response to chemotherapy. However, she is experiencing swelling and discomfort, which feels like bruises. The swelling affects both feet and extends upwards, causing a sensation of tightness and stiffness. Despite taking Lasix , the swelling persists. The patient also reports fatigue and changes in taste, which may be side effects of the chemotherapy. She is managing nausea with ondansetron  or Compazine , taken as needed. The patient also mentions numbness in the feet and hands, which may be related to the swelling or a side effect of the chemotherapy.         ALLERGIES:  is allergic to eribulin  mesylate, codeine , cymbalta  [duloxetine  hcl], hydrocodone , ultram  [tramadol  hcl], venlafaxine , and gabapentin .  MEDICATIONS:  Current  Outpatient Medications  Medication Sig Dispense Refill   carvedilol  (COREG ) 3.125 MG tablet Take 1 tablet (3.125 mg total) by mouth 2 (two) times daily. 180 tablet 3   Cetirizine  HCl (ZYRTEC  ALLERGY PO) Take 10 mg by mouth daily.     cyclobenzaprine  (FLEXERIL ) 5 MG tablet Take 1 tablet (5 mg total) by mouth 3 (three) times daily as needed for muscle spasms. 30 tablet 0   Denosumab  (XGEVA  [place]) Inject 120 mg into the skin every 3 (three) months.     furosemide  (LASIX ) 20 MG tablet Take 1 tablet (20 mg total) by mouth 2 (two) times daily. 60 tablet 0   KLOR-CON  M10 10 MEQ tablet TAKE 1 TABLET BY MOUTH EVERY DAY 90 tablet 1   levofloxacin  (LEVAQUIN ) 750 MG tablet Take 1 tablet (750 mg total) by mouth daily. 7 tablet 0    lidocaine -prilocaine  (EMLA ) cream Apply to affected area once 30 g 3   naproxen sodium (ALEVE) 220 MG tablet Take 440-660 mg by mouth 2 (two) times daily as needed (pain).     omeprazole  (PRILOSEC) 20 MG capsule Take 20 mg by mouth daily.     ondansetron  (ZOFRAN ) 8 MG tablet as needed for nausea or vomiting.     ondansetron  (ZOFRAN ) 8 MG tablet Take 1 tablet (8 mg total) by mouth every 8 (eight) hours as needed for nausea or vomiting. 30 tablet 1   prochlorperazine  (COMPAZINE ) 10 MG tablet Take 1 tablet (10 mg total) by mouth every 6 (six) hours as needed for nausea or vomiting. 30 tablet 1   rizatriptan  (MAXALT ) 5 MG tablet Take 1 tablet (5 mg total) by mouth as needed for migraine. May repeat in 2 hours if needed 10 tablet 0   valACYclovir  (VALTREX ) 1000 MG tablet Take 1 tablet (1,000 mg total) by mouth 2 (two) times daily. 1/2 tab BID (Patient taking differently: Take 500 mg by mouth See admin instructions. Take 500 mg daily, may take a second 500 mg dose as needed for outbreaks) 180 tablet 4   No current facility-administered medications for this visit.    PHYSICAL EXAMINATION: ECOG PERFORMANCE STATUS: 1 - Symptomatic but completely ambulatory  Vitals:   12/17/23 1436  BP: 105/64  Pulse: 89  Resp: 18  Temp: 97.9 F (36.6 C)  SpO2: 98%   Filed Weights   12/17/23 1436  Weight: 220 lb 8 oz (100 kg)     LABORATORY DATA:  I have reviewed the data as listed    Latest Ref Rng & Units 12/17/2023    2:18 PM 11/26/2023    8:39 AM 11/13/2023    8:49 AM  CMP  Glucose 70 - 99 mg/dL 83  893  96   BUN 8 - 23 mg/dL 10  8  7    Creatinine 0.44 - 1.00 mg/dL 9.30  9.43  9.49   Sodium 135 - 145 mmol/L 139  140  141   Potassium 3.5 - 5.1 mmol/L 3.8  3.5  3.1   Chloride 98 - 111 mmol/L 109  112  110   CO2 22 - 32 mmol/L 27  23  24    Calcium 8.9 - 10.3 mg/dL 8.7  8.0  8.0   Total Protein 6.5 - 8.1 g/dL 5.2  5.1  5.7   Total Bilirubin 0.0 - 1.2 mg/dL 2.0  2.3  2.0   Alkaline Phos 38 - 126  U/L 266  298  330   AST 15 - 41 U/L 47  47  55  ALT 0 - 44 U/L 20  19  23      Lab Results  Component Value Date   WBC 3.6 (L) 12/17/2023   HGB 11.2 (L) 12/17/2023   HCT 33.3 (L) 12/17/2023   MCV 101.8 (H) 12/17/2023   PLT 134 (L) 12/17/2023   NEUTROABS 2.8 12/17/2023    ASSESSMENT & PLAN:  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) 1.  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 bone metastasis: Currently on Xgeva  every 3 months.   CT CAP 02/01/2022: Mild progression of disease with increased number and size of the numerous lung nodules in the right lung.  Persistent lymphadenopathy and right hilar and right paratracheal nodal areas, numerous hypovascular hepatic lesions slightly larger when compared to previous.   03/28/2023: CA 27-29: 84.4 04/19/2023: CA 27-29: 126 06/22/2023: CA 27-29: 204 09/13/2023: CA 27-29: 329 11/06/2023: CA 27-29: 124   CT CAP 05/07/2023: Interval increase in size of right hepatic metastases 2.7 cm (to be 1.6 cm) and cluster of lesions in the left hepatic lobe are similar, bone metastases stable   Plan: [facility] molecular testing: ER positive, ER positive, TMB 5, PD-L1 negative, PIK 3 CA negative  -------------------------------------------------------- Current treatment: Cycle 4 Eribulin  (to be given 11/27/2023) Chemo toxicities: Profound neutropenia: Previously we could not administer cycle 1 day 8.  She has been getting Neupogen  injections on Saturdays prior to chemo. Mouth sores: Encouraged the patient to use salt water gargles.  She did not want Magic mouthwash. Thrombocytopenia: Watching and monitoring Hypokalemia: On potassium 10 mEq daily. Muscle spasms: Could be related to hypokalemia.   Abdominal distention and lower extremity edema: I would like to get ultrasound of lower extremities as well as obtain CT scans of the  abdomen.  In fact she has orders for scans to be done in January and we will try to see if it can be moved up. CT CAP 11/26/2023: Diminished size of the liver metastases consistent with treatment response.  Changed bone metastases conspicuously of L2, increase anasarca  Continue current treatment   The improvement in CA 27-29 is very encouraging ------------------------------------- Assessment and Plan    Metastatic Liver Cancer Significant reduction in size of liver metastases on recent CT scan, indicating a positive response to current treatment. -Continue current chemotherapy regimen.  Edema Bilateral lower extremity swelling and discomfort despite ongoing Lasix  therapy. Patient had a period of improvement after initial Lasix  therapy but symptoms have returned. -Continue Lasix  2 tablets daily. -Advise patient not to stop Lasix  when symptoms improve, but to consider reducing to 1 tablet daily.  General Fatigue Patient reports feeling very tired and sleepy most days. -Consider potential causes such as low calcium levels and vitamin D  deficiency. -Advise patient to consider over-the-counter vitamin D  supplement.  Follow-up -Return for booster shot on 01/05/2024 before next treatment on 01/08/2024.          No orders of the defined types were placed in this encounter.  The patient has a good understanding of the overall plan. she agrees with it. she will call with any problems that may develop before the next visit here. Total time spent: 30 mins including face to face time and time spent for planning, charting and co-ordination of care   Naomi MARLA Chad, MD 12/17/23

## 2023-12-18 ENCOUNTER — Other Ambulatory Visit: Payer: Self-pay | Admitting: Hematology and Oncology

## 2023-12-18 LAB — CANCER ANTIGEN 27.29: CA 27.29: 50.4 U/mL — ABNORMAL HIGH (ref 0.0–38.6)

## 2023-12-24 ENCOUNTER — Encounter: Payer: Self-pay | Admitting: Hematology and Oncology

## 2023-12-25 NOTE — Addendum Note (Signed)
Encounter addended by: Meta Hatchet, RT on: 12/25/2023 4:10 PM  Actions taken: Imaging Exam ended

## 2023-12-31 ENCOUNTER — Telehealth: Payer: Self-pay

## 2023-12-31 ENCOUNTER — Encounter: Payer: Self-pay | Admitting: Internal Medicine

## 2023-12-31 NOTE — Telephone Encounter (Signed)
Patient would like a sooner new patient appointment.   We will call patient if there is a cancellation.

## 2024-01-01 ENCOUNTER — Other Ambulatory Visit: Payer: Self-pay

## 2024-01-02 ENCOUNTER — Encounter: Payer: Self-pay | Admitting: Hematology and Oncology

## 2024-01-05 ENCOUNTER — Inpatient Hospital Stay: Payer: Medicaid Other | Attending: Oncology

## 2024-01-05 VITALS — BP 105/71 | HR 97 | Temp 98.1°F | Resp 16

## 2024-01-05 DIAGNOSIS — C787 Secondary malignant neoplasm of liver and intrahepatic bile duct: Secondary | ICD-10-CM | POA: Diagnosis not present

## 2024-01-05 DIAGNOSIS — G629 Polyneuropathy, unspecified: Secondary | ICD-10-CM | POA: Insufficient documentation

## 2024-01-05 DIAGNOSIS — M810 Age-related osteoporosis without current pathological fracture: Secondary | ICD-10-CM | POA: Diagnosis not present

## 2024-01-05 DIAGNOSIS — C773 Secondary and unspecified malignant neoplasm of axilla and upper limb lymph nodes: Secondary | ICD-10-CM | POA: Insufficient documentation

## 2024-01-05 DIAGNOSIS — M797 Fibromyalgia: Secondary | ICD-10-CM | POA: Diagnosis not present

## 2024-01-05 DIAGNOSIS — E7800 Pure hypercholesterolemia, unspecified: Secondary | ICD-10-CM | POA: Diagnosis not present

## 2024-01-05 DIAGNOSIS — R5383 Other fatigue: Secondary | ICD-10-CM | POA: Insufficient documentation

## 2024-01-05 DIAGNOSIS — C50911 Malignant neoplasm of unspecified site of right female breast: Secondary | ICD-10-CM

## 2024-01-05 DIAGNOSIS — I890 Lymphedema, not elsewhere classified: Secondary | ICD-10-CM | POA: Insufficient documentation

## 2024-01-05 DIAGNOSIS — C50411 Malignant neoplasm of upper-outer quadrant of right female breast: Secondary | ICD-10-CM | POA: Diagnosis not present

## 2024-01-05 DIAGNOSIS — Z79624 Long term (current) use of inhibitors of nucleotide synthesis: Secondary | ICD-10-CM | POA: Insufficient documentation

## 2024-01-05 DIAGNOSIS — M129 Arthropathy, unspecified: Secondary | ICD-10-CM | POA: Diagnosis not present

## 2024-01-05 DIAGNOSIS — Z5111 Encounter for antineoplastic chemotherapy: Secondary | ICD-10-CM | POA: Insufficient documentation

## 2024-01-05 DIAGNOSIS — R609 Edema, unspecified: Secondary | ICD-10-CM | POA: Insufficient documentation

## 2024-01-05 DIAGNOSIS — Z79811 Long term (current) use of aromatase inhibitors: Secondary | ICD-10-CM | POA: Diagnosis not present

## 2024-01-05 DIAGNOSIS — Z170 Estrogen receptor positive status [ER+]: Secondary | ICD-10-CM | POA: Diagnosis not present

## 2024-01-05 DIAGNOSIS — K219 Gastro-esophageal reflux disease without esophagitis: Secondary | ICD-10-CM | POA: Insufficient documentation

## 2024-01-05 DIAGNOSIS — C7951 Secondary malignant neoplasm of bone: Secondary | ICD-10-CM | POA: Insufficient documentation

## 2024-01-05 DIAGNOSIS — Z9221 Personal history of antineoplastic chemotherapy: Secondary | ICD-10-CM | POA: Insufficient documentation

## 2024-01-05 DIAGNOSIS — M419 Scoliosis, unspecified: Secondary | ICD-10-CM | POA: Insufficient documentation

## 2024-01-05 DIAGNOSIS — Z87891 Personal history of nicotine dependence: Secondary | ICD-10-CM | POA: Insufficient documentation

## 2024-01-05 DIAGNOSIS — C50919 Malignant neoplasm of unspecified site of unspecified female breast: Secondary | ICD-10-CM

## 2024-01-05 DIAGNOSIS — Z923 Personal history of irradiation: Secondary | ICD-10-CM | POA: Diagnosis not present

## 2024-01-05 DIAGNOSIS — A6009 Herpesviral infection of other urogenital tract: Secondary | ICD-10-CM | POA: Diagnosis not present

## 2024-01-05 MED ORDER — FILGRASTIM-SNDZ 300 MCG/0.5ML IJ SOSY
300.0000 ug | PREFILLED_SYRINGE | Freq: Once | INTRAMUSCULAR | Status: AC
Start: 2024-01-05 — End: 2024-01-05
  Administered 2024-01-05: 300 ug via SUBCUTANEOUS
  Filled 2024-01-05: qty 0.5

## 2024-01-07 ENCOUNTER — Encounter: Payer: Self-pay | Admitting: Hematology and Oncology

## 2024-01-08 ENCOUNTER — Inpatient Hospital Stay: Payer: Medicaid Other

## 2024-01-08 ENCOUNTER — Encounter: Payer: Self-pay | Admitting: Hematology and Oncology

## 2024-01-08 ENCOUNTER — Encounter: Payer: Self-pay | Admitting: Adult Health

## 2024-01-08 ENCOUNTER — Inpatient Hospital Stay (HOSPITAL_BASED_OUTPATIENT_CLINIC_OR_DEPARTMENT_OTHER): Payer: Medicaid Other | Admitting: Adult Health

## 2024-01-08 VITALS — BP 101/66 | HR 93 | Temp 98.1°F | Resp 16 | Wt 213.3 lb

## 2024-01-08 DIAGNOSIS — T451X5A Adverse effect of antineoplastic and immunosuppressive drugs, initial encounter: Secondary | ICD-10-CM

## 2024-01-08 DIAGNOSIS — C50411 Malignant neoplasm of upper-outer quadrant of right female breast: Secondary | ICD-10-CM

## 2024-01-08 DIAGNOSIS — C50919 Malignant neoplasm of unspecified site of unspecified female breast: Secondary | ICD-10-CM

## 2024-01-08 DIAGNOSIS — Z170 Estrogen receptor positive status [ER+]: Secondary | ICD-10-CM | POA: Diagnosis not present

## 2024-01-08 DIAGNOSIS — G620 Drug-induced polyneuropathy: Secondary | ICD-10-CM

## 2024-01-08 DIAGNOSIS — C773 Secondary and unspecified malignant neoplasm of axilla and upper limb lymph nodes: Secondary | ICD-10-CM

## 2024-01-08 DIAGNOSIS — C50911 Malignant neoplasm of unspecified site of right female breast: Secondary | ICD-10-CM | POA: Diagnosis not present

## 2024-01-08 DIAGNOSIS — I890 Lymphedema, not elsewhere classified: Secondary | ICD-10-CM | POA: Diagnosis not present

## 2024-01-08 DIAGNOSIS — Z09 Encounter for follow-up examination after completed treatment for conditions other than malignant neoplasm: Secondary | ICD-10-CM

## 2024-01-08 DIAGNOSIS — Z5111 Encounter for antineoplastic chemotherapy: Secondary | ICD-10-CM | POA: Diagnosis not present

## 2024-01-08 LAB — CBC WITH DIFFERENTIAL (CANCER CENTER ONLY)
Abs Immature Granulocytes: 0.02 10*3/uL (ref 0.00–0.07)
Basophils Absolute: 0 10*3/uL (ref 0.0–0.1)
Basophils Relative: 1 %
Eosinophils Absolute: 0 10*3/uL (ref 0.0–0.5)
Eosinophils Relative: 0 %
HCT: 32.3 % — ABNORMAL LOW (ref 36.0–46.0)
Hemoglobin: 11.1 g/dL — ABNORMAL LOW (ref 12.0–15.0)
Immature Granulocytes: 1 %
Lymphocytes Relative: 10 %
Lymphs Abs: 0.3 10*3/uL — ABNORMAL LOW (ref 0.7–4.0)
MCH: 35 pg — ABNORMAL HIGH (ref 26.0–34.0)
MCHC: 34.4 g/dL (ref 30.0–36.0)
MCV: 101.9 fL — ABNORMAL HIGH (ref 80.0–100.0)
Monocytes Absolute: 0.3 10*3/uL (ref 0.1–1.0)
Monocytes Relative: 9 %
Neutro Abs: 2.5 10*3/uL (ref 1.7–7.7)
Neutrophils Relative %: 79 %
Platelet Count: 106 10*3/uL — ABNORMAL LOW (ref 150–400)
RBC: 3.17 MIL/uL — ABNORMAL LOW (ref 3.87–5.11)
RDW: 21.5 % — ABNORMAL HIGH (ref 11.5–15.5)
WBC Count: 3.1 10*3/uL — ABNORMAL LOW (ref 4.0–10.5)
nRBC: 0 % (ref 0.0–0.2)

## 2024-01-08 LAB — CMP (CANCER CENTER ONLY)
ALT: 19 U/L (ref 0–44)
AST: 46 U/L — ABNORMAL HIGH (ref 15–41)
Albumin: 2.6 g/dL — ABNORMAL LOW (ref 3.5–5.0)
Alkaline Phosphatase: 262 U/L — ABNORMAL HIGH (ref 38–126)
Anion gap: 4 — ABNORMAL LOW (ref 5–15)
BUN: 10 mg/dL (ref 8–23)
CO2: 25 mmol/L (ref 22–32)
Calcium: 8.1 mg/dL — ABNORMAL LOW (ref 8.9–10.3)
Chloride: 111 mmol/L (ref 98–111)
Creatinine: 0.55 mg/dL (ref 0.44–1.00)
GFR, Estimated: >60 mL/min (ref >60)
Glucose, Bld: 121 mg/dL — ABNORMAL HIGH (ref 70–99)
Potassium: 3 mmol/L — ABNORMAL LOW (ref 3.5–5.1)
Sodium: 140 mmol/L (ref 135–145)
Total Bilirubin: 2.1 mg/dL — ABNORMAL HIGH (ref 0.0–1.2)
Total Protein: 5.4 g/dL — ABNORMAL LOW (ref 6.5–8.1)

## 2024-01-08 LAB — MAGNESIUM: Magnesium: 1.7 mg/dL (ref 1.7–2.4)

## 2024-01-08 MED ORDER — GABAPENTIN 100 MG PO CAPS: ORAL_CAPSULE | ORAL | 0 refills | Status: DC

## 2024-01-08 MED ORDER — SODIUM CHLORIDE 0.9% FLUSH
10.0000 mL | Freq: Once | INTRAVENOUS | Status: AC
Start: 2024-01-08 — End: 2024-01-08
  Administered 2024-01-08: 10 mL

## 2024-01-08 NOTE — Patient Instructions (Signed)
 Doxorubicin Liposomal Injection What is this medication? DOXORUBICIN LIPOSOMAL (dox oh ROO bi sin LIP oh som al) treats some types of cancer. It works by slowing down the growth of cancer cells. This medicine may be used for other purposes; ask your health care provider or pharmacist if you have questions. COMMON BRAND NAME(S): Doxil, Lipodox What should I tell my care team before I take this medication? They need to know if you have any of these conditions: Blood disorder Heart disease Infection especially a viral infection, such as chickenpox, cold sores, herpes Liver disease Recent or ongoing radiation An unusual or allergic reaction to doxorubicin, soybeans, other medications, foods, dyes, or preservatives If you or your partner are pregnant or trying to get pregnant Breast-feeding How should I use this medication? This medication is injected into a vein. It is given by your care team in a hospital or clinic setting. Talk to your care team about the use of this medication in children. Special care may be needed. Overdosage: If you think you have taken too much of this medicine contact a poison control center or emergency room at once. NOTE: This medicine is only for you. Do not share this medicine with others. What if I miss a dose? Keep appointments for follow-up doses. It is important not to miss your dose. Call your care team if you are unable to keep an appointment. What may interact with this medication? Do not take this medication with any of the following: Zidovudine This medication may also interact with the following: Medications to increase blood counts, such as filgrastim, pegfilgrastim, sargramostim Vaccines This list may not describe all possible interactions. Give your health care provider a list of all the medicines, herbs, non-prescription drugs, or dietary supplements you use. Also tell them if you smoke, drink alcohol, or use illegal drugs. Some items may interact  with your medicine. What should I watch for while using this medication? Your condition will be monitored carefully while you are receiving this medication. You may need blood work while taking this medication. This medication may make you feel generally unwell. This is not uncommon as chemotherapy can affect healthy cells as well as cancer cells. Report any side effects. Continue your course of treatment even though you feel ill unless your care team tells you to stop. Your urine may turn orange-red for a few days after your dose. This is not blood. If your urine is dark or brown, call your care team. In some cases, you may be given additional medications to help with side effects. Follow all directions for their use. Talk to your care team about your risk of cancer. You may be more at risk for certain types of cancers if you take this medication. Talk to your care team if you or your partner may be pregnant. Serious birth defects can occur if you take this medication during pregnancy and for 6 months after the last dose. Contraception is recommended while taking this medication and for 6 months after the last dose. Your care team can help you find the option that works for you. If your partner can get pregnant, use a condom while taking this medication and for 6 months after the last dose. Do not breastfeed while taking this medication. This medication may cause infertility. Talk to your care team if you are concerned about your fertility. What side effects may I notice from receiving this medication? Side effects that you should report to your care team as soon as possible:  Allergic reactions--skin rash, itching, hives, swelling of the face, lips, tongue, or throat Heart failure--shortness of breath, swelling of the ankles, feet, or hands, sudden weight gain, unusual weakness or fatigue Infection--fever, chills, cough, sore throat, wounds that don't heal, pain or trouble when passing urine, general  feeling of discomfort or being unwell Infusion reactions--chest pain, shortness of breath or trouble breathing, feeling faint or lightheaded Low red blood cell level--unusual weakness or fatigue, dizziness, headache, trouble breathing Redness, swelling, and blistering of the skin over hands and feet Unusual bruising or bleeding Side effects that usually do not require medical attention (report to your care team if they continue or are bothersome): Constipation Diarrhea Loss of appetite Nausea Pain, redness, or swelling with sores inside the mouth or throat Red urine Unusual weakness or fatigue This list may not describe all possible side effects. Call your doctor for medical advice about side effects. You may report side effects to FDA at 1-800-FDA-1088. Where should I keep my medication? This medication is given in a hospital or clinic. It will not be stored at home. NOTE: This sheet is a summary. It may not cover all possible information. If you have questions about this medicine, talk to your doctor, pharmacist, or health care provider.  2024 [employer]vier/Gold Standard (2023-02-22 00:00:00)
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
## 2024-01-09 LAB — CANCER ANTIGEN 27.29: CA 27.29: 51.2 U/mL — ABNORMAL HIGH (ref 0.0–38.6)

## 2024-01-10 ENCOUNTER — Encounter: Payer: Self-pay | Admitting: Adult Health

## 2024-01-10 ENCOUNTER — Encounter: Payer: Self-pay | Admitting: Hematology and Oncology

## 2024-01-10 MED ORDER — PROCHLORPERAZINE MALEATE 10 MG PO TABS: 10.0000 mg | ORAL_TABLET | Freq: Four times a day (QID) | ORAL | 1 refills | Status: DC | PRN

## 2024-01-10 MED ORDER — LIDOCAINE-PRILOCAINE 2.5-2.5 % EX CREA: TOPICAL_CREAM | CUTANEOUS | 3 refills | Status: DC

## 2024-01-10 MED ORDER — ONDANSETRON HCL 8 MG PO TABS: 8.0000 mg | ORAL_TABLET | Freq: Three times a day (TID) | ORAL | 1 refills | Status: DC | PRN

## 2024-01-10 NOTE — Progress Notes (Signed)
 DISCONTINUE ON PATHWAY REGIMEN - Breast     A cycle is every 21 days:     Eribulin  mesylate   **Always confirm dose/schedule in your pharmacy ordering system**  PRIOR TREATMENT: [id number]: Eribulin  1.4 mg/m2 D1, 8 q21 Days  START ON PATHWAY REGIMEN - Breast     A cycle is every 28 days:     Liposomal doxorubicin    **Always confirm dose/schedule in your pharmacy ordering system**  Patient Characteristics: Distant Metastases or Locoregional Recurrent Disease - Unresected, M0 or Locally Advanced Unresectable Disease Progressing after Neoadjuvant and Local Therapies, M0, HER2 Low/Negative, ER Positive, Chemotherapy, HER2 Low, Third Line and Beyond, [employer] or Not a Candidate for Molecular Targeted Therapy Therapeutic Status: Distant Metastases HER2 Status: Low ER Status: Positive (+) PR Status: Positive (+) Therapy Approach Indicated: Standard Chemotherapy/Endocrine Therapy Line of Therapy: Third Line and Beyond Intent of Therapy: Non-Curative / Palliative Intent, Discussed with Patient

## 2024-01-10 NOTE — Assessment & Plan Note (Signed)
 1.  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 bone metastasis: Currently on Xgeva  every 3 months.   CT CAP 02/01/2022: Mild progression of disease with increased number and size of the numerous lung nodules in the right lung.  Persistent lymphadenopathy and right hilar and right paratracheal nodal areas, numerous hypovascular hepatic lesions slightly larger when compared to previous.   03/28/2023: CA 27-29: 84.4 04/19/2023: CA 27-29: 126 06/22/2023: CA 27-29: 204 09/13/2023: CA 27-29: 329 11/06/2023: CA 27-29: 124   CT CAP 05/07/2023: Interval increase in size of right hepatic metastases 2.7 cm (to be 1.6 cm) and cluster of lesions in the left hepatic lobe are similar, bone metastases stable   Plan: Caris molecular testing: ER positive, ER positive, TMB 5, PD-L1 negative, PIK 3 CA negative  -------------------------------------------------------- Current treatment: Eribulin  Chemo toxicities: Profound fatigue: Recommended continued energy conservation alternating with exercise Swelling: She will double her lasix  and her potassium per her discussion with Dr. Odean Breast changes: This is concerning for progression of her cancer.  Will monitor for changes with her treatment change to Doxil  Progressing peripheral neuropathy: She will stop Eribulin  as it is causing neuropathy.  Will change treatment to Doxil .  PT referral placed.  Gabapentin  TID, she does not believe that the rash she had was caused by Gabapentin  and is motivated to try Gabapentin  again for her neuropathy.  Right arm lymphedema: PT referral placed.     The patient met with Dr. Gudena and discussed the above as well.  She understands the risks and benefits of the treatment and would like to proceed.   RTC in 1 week to start new treatment

## 2024-01-11 ENCOUNTER — Ambulatory Visit (HOSPITAL_BASED_OUTPATIENT_CLINIC_OR_DEPARTMENT_OTHER): Payer: Medicaid Other

## 2024-01-11 DIAGNOSIS — R600 Localized edema: Secondary | ICD-10-CM

## 2024-01-11 DIAGNOSIS — C50919 Malignant neoplasm of unspecified site of unspecified female breast: Secondary | ICD-10-CM

## 2024-01-11 DIAGNOSIS — Z170 Estrogen receptor positive status [ER+]: Secondary | ICD-10-CM

## 2024-01-11 DIAGNOSIS — T451X5D Adverse effect of antineoplastic and immunosuppressive drugs, subsequent encounter: Secondary | ICD-10-CM

## 2024-01-11 DIAGNOSIS — G620 Drug-induced polyneuropathy: Secondary | ICD-10-CM | POA: Diagnosis not present

## 2024-01-11 DIAGNOSIS — C773 Secondary and unspecified malignant neoplasm of axilla and upper limb lymph nodes: Secondary | ICD-10-CM

## 2024-01-11 DIAGNOSIS — C50911 Malignant neoplasm of unspecified site of right female breast: Secondary | ICD-10-CM

## 2024-01-11 DIAGNOSIS — I890 Lymphedema, not elsewhere classified: Secondary | ICD-10-CM | POA: Diagnosis not present

## 2024-01-11 DIAGNOSIS — C50411 Malignant neoplasm of upper-outer quadrant of right female breast: Secondary | ICD-10-CM | POA: Diagnosis not present

## 2024-01-11 DIAGNOSIS — R5383 Other fatigue: Secondary | ICD-10-CM | POA: Diagnosis not present

## 2024-01-11 DIAGNOSIS — Z09 Encounter for follow-up examination after completed treatment for conditions other than malignant neoplasm: Secondary | ICD-10-CM

## 2024-01-11 LAB — ECHOCARDIOGRAM COMPLETE
AR max vel: 2.22 cm2
AV Area VTI: 2.43 cm2
AV Area mean vel: 2.54 cm2
AV Mean grad: 5 mm[Hg]
AV Peak grad: 11.7 mm[Hg]
Ao pk vel: 1.71 m/s
Area-P 1/2: 3.85 cm2
S' Lateral: 3.31 cm

## 2024-01-18 ENCOUNTER — Other Ambulatory Visit: Payer: Self-pay | Admitting: Internal Medicine

## 2024-01-21 ENCOUNTER — Other Ambulatory Visit: Payer: Self-pay

## 2024-01-21 ENCOUNTER — Inpatient Hospital Stay: Payer: Medicaid Other

## 2024-01-21 ENCOUNTER — Inpatient Hospital Stay (HOSPITAL_BASED_OUTPATIENT_CLINIC_OR_DEPARTMENT_OTHER): Payer: Medicaid Other | Admitting: Hematology and Oncology

## 2024-01-21 ENCOUNTER — Encounter: Payer: Self-pay | Admitting: Adult Health

## 2024-01-21 ENCOUNTER — Encounter: Payer: Self-pay | Admitting: Rehabilitation

## 2024-01-21 ENCOUNTER — Other Ambulatory Visit: Payer: Self-pay | Admitting: Hematology and Oncology

## 2024-01-21 ENCOUNTER — Ambulatory Visit: Payer: Medicaid Other | Attending: Adult Health | Admitting: Rehabilitation

## 2024-01-21 VITALS — BP 117/69 | HR 91 | Temp 98.3°F | Resp 17 | Ht 64.0 in | Wt 218.9 lb

## 2024-01-21 DIAGNOSIS — C773 Secondary and unspecified malignant neoplasm of axilla and upper limb lymph nodes: Secondary | ICD-10-CM

## 2024-01-21 DIAGNOSIS — C50411 Malignant neoplasm of upper-outer quadrant of right female breast: Secondary | ICD-10-CM | POA: Diagnosis not present

## 2024-01-21 DIAGNOSIS — G620 Drug-induced polyneuropathy: Secondary | ICD-10-CM | POA: Insufficient documentation

## 2024-01-21 DIAGNOSIS — C50911 Malignant neoplasm of unspecified site of right female breast: Secondary | ICD-10-CM

## 2024-01-21 DIAGNOSIS — T451X5A Adverse effect of antineoplastic and immunosuppressive drugs, initial encounter: Secondary | ICD-10-CM | POA: Diagnosis not present

## 2024-01-21 DIAGNOSIS — C50919 Malignant neoplasm of unspecified site of unspecified female breast: Secondary | ICD-10-CM

## 2024-01-21 DIAGNOSIS — Z170 Estrogen receptor positive status [ER+]: Secondary | ICD-10-CM

## 2024-01-21 DIAGNOSIS — Z5111 Encounter for antineoplastic chemotherapy: Secondary | ICD-10-CM | POA: Diagnosis not present

## 2024-01-21 DIAGNOSIS — I890 Lymphedema, not elsewhere classified: Secondary | ICD-10-CM

## 2024-01-21 LAB — CMP (CANCER CENTER ONLY)
ALT: 19 U/L (ref 0–44)
AST: 42 U/L — ABNORMAL HIGH (ref 15–41)
Albumin: 2.6 g/dL — ABNORMAL LOW (ref 3.5–5.0)
Alkaline Phosphatase: 254 U/L — ABNORMAL HIGH (ref 38–126)
Anion gap: 5 (ref 5–15)
BUN: 9 mg/dL (ref 8–23)
CO2: 27 mmol/L (ref 22–32)
Calcium: 8.2 mg/dL — ABNORMAL LOW (ref 8.9–10.3)
Chloride: 107 mmol/L (ref 98–111)
Creatinine: 0.58 mg/dL (ref 0.44–1.00)
GFR, Estimated: >60 mL/min (ref >60)
Glucose, Bld: 85 mg/dL (ref 70–99)
Potassium: 3.5 mmol/L (ref 3.5–5.1)
Sodium: 139 mmol/L (ref 135–145)
Total Bilirubin: 2.1 mg/dL — ABNORMAL HIGH (ref 0.0–1.2)
Total Protein: 5.6 g/dL — ABNORMAL LOW (ref 6.5–8.1)

## 2024-01-21 LAB — CBC WITH DIFFERENTIAL (CANCER CENTER ONLY)
Abs Immature Granulocytes: 0 10*3/uL (ref 0.00–0.07)
Basophils Absolute: 0 10*3/uL (ref 0.0–0.1)
Basophils Relative: 1 %
Eosinophils Absolute: 0.1 10*3/uL (ref 0.0–0.5)
Eosinophils Relative: 3 %
HCT: 32.9 % — ABNORMAL LOW (ref 36.0–46.0)
Hemoglobin: 11.3 g/dL — ABNORMAL LOW (ref 12.0–15.0)
Immature Granulocytes: 0 %
Lymphocytes Relative: 11 %
Lymphs Abs: 0.2 10*3/uL — ABNORMAL LOW (ref 0.7–4.0)
MCH: 36.8 pg — ABNORMAL HIGH (ref 26.0–34.0)
MCHC: 34.3 g/dL (ref 30.0–36.0)
MCV: 107.2 fL — ABNORMAL HIGH (ref 80.0–100.0)
Monocytes Absolute: 0.3 10*3/uL (ref 0.1–1.0)
Monocytes Relative: 15 %
Neutro Abs: 1.5 10*3/uL — ABNORMAL LOW (ref 1.7–7.7)
Neutrophils Relative %: 70 %
Platelet Count: 92 10*3/uL — ABNORMAL LOW (ref 150–400)
RBC: 3.07 MIL/uL — ABNORMAL LOW (ref 3.87–5.11)
RDW: 19.9 % — ABNORMAL HIGH (ref 11.5–15.5)
WBC Count: 2.1 10*3/uL — ABNORMAL LOW (ref 4.0–10.5)
nRBC: 0 % (ref 0.0–0.2)

## 2024-01-21 MED ORDER — SODIUM CHLORIDE 0.9% FLUSH
10.0000 mL | Freq: Once | INTRAVENOUS | Status: AC
  Administered 2024-01-21: 10 mL

## 2024-01-21 MED ORDER — DOXORUBICIN HCL LIPOSOMAL CHEMO INJECTION 2 MG/ML
37.5000 mg/m2 | Freq: Once | INTRAVENOUS | Status: DC
  Filled 2024-01-21: qty 40

## 2024-01-21 MED ORDER — DEXAMETHASONE SODIUM PHOSPHATE 10 MG/ML IJ SOLN
10.0000 mg | Freq: Once | INTRAMUSCULAR | Status: AC
  Administered 2024-01-21: 10 mg via INTRAVENOUS
  Filled 2024-01-21: qty 1

## 2024-01-21 MED ORDER — DEXTROSE 5 % IV SOLN: INTRAVENOUS | Status: DC

## 2024-01-21 MED ORDER — DOXORUBICIN HCL LIPOSOMAL CHEMO INJECTION 2 MG/ML
70.0000 mg | Freq: Once | INTRAVENOUS | Status: AC
  Administered 2024-01-21: 70 mg via INTRAVENOUS
  Filled 2024-01-21: qty 25

## 2024-01-21 MED ORDER — FUROSEMIDE 40 MG PO TABS: 40.0000 mg | ORAL_TABLET | Freq: Two times a day (BID) | ORAL | 6 refills | Status: DC

## 2024-01-21 MED ORDER — SODIUM CHLORIDE 0.9% FLUSH
10.0000 mL | INTRAVENOUS | Status: DC | PRN
  Administered 2024-01-21: 10 mL

## 2024-01-21 MED ORDER — HEPARIN SOD (PORK) LOCK FLUSH 100 UNIT/ML IV SOLN
500.0000 [IU] | Freq: Once | INTRAVENOUS | Status: AC | PRN
  Administered 2024-01-21: 500 [IU]

## 2024-01-21 NOTE — Patient Instructions (Signed)
[facility] - A DEPT OF [facility]  Discharge Instructions: Thank you for choosing [facility] to provide your oncology and hematology care.   If you have a lab appointment with the Cancer Center, please go directly to the Cancer Center and check in at the registration area.   Wear comfortable clothing and clothing appropriate for easy access to any Portacath or PICC line.   We strive to give you quality time with your provider. You may need to reschedule your appointment if you arrive late (15 or more minutes).  Arriving late affects you and other patients whose appointments are after yours.  Also, if you miss three or more appointments without notifying the office, you may be dismissed from the clinic at the provider's discretion.      For prescription refill requests, have your pharmacy contact our office and allow 72 hours for refills to be completed.    Today you received the following chemotherapy and/or immunotherapy agents: Doxil      To help prevent nausea and vomiting after your treatment, we encourage you to take your nausea medication as directed.  BELOW ARE SYMPTOMS THAT SHOULD BE REPORTED IMMEDIATELY: *FEVER GREATER THAN 100.4 F (38 C) OR HIGHER *CHILLS OR SWEATING *NAUSEA AND VOMITING THAT IS NOT CONTROLLED WITH YOUR NAUSEA MEDICATION *UNUSUAL SHORTNESS OF BREATH *UNUSUAL BRUISING OR BLEEDING *URINARY PROBLEMS (pain or burning when urinating, or frequent urination) *BOWEL PROBLEMS (unusual diarrhea, constipation, pain near the anus) TENDERNESS IN MOUTH AND THROAT WITH OR WITHOUT PRESENCE OF ULCERS (sore throat, sores in mouth, or a toothache) UNUSUAL RASH, SWELLING OR PAIN  UNUSUAL VAGINAL DISCHARGE OR ITCHING   Items with * indicate a potential emergency and should be followed up as soon as possible or go to the Emergency Department if any problems should occur.  Please show the CHEMOTHERAPY ALERT CARD or IMMUNOTHERAPY  ALERT CARD at check-in to the Emergency Department and triage nurse.  Should you have questions after your visit or need to cancel or reschedule your appointment, please contact [facility] - A DEPT OF [facility]  Dept: [phone]  and follow the prompts.  Office hours are 8:00 a.m. to 4:30 p.m. Monday - Friday. Please note that voicemails left after 4:00 p.m. may not be returned until the following business day.  We are closed weekends and major holidays. You have access to a nurse at all times for urgent questions. Please call the main number to the clinic Dept: [phone] and follow the prompts.   For any non-urgent questions, you may also contact your provider using MyChart. We now offer e-Visits for anyone 73 and older to request care online for non-urgent symptoms. For details visit [email or web address].   Also download the MyChart app! Go to the app store, search "MyChart", open the app, select [facility], and log in with your MyChart username and password.  Doxorubicin Liposomal Injection What is this medication? DOXORUBICIN LIPOSOMAL (dox oh ROO bi sin LIP oh som al) treats some types of cancer. It works by slowing down the growth of cancer cells. This medicine may be used for other purposes; ask your health care provider or pharmacist if you have questions. COMMON BRAND NAME(S): Doxil, Lipodox What should I tell my care team before I take this medication? They need to know if you have any of these conditions: Blood disorder Heart disease Infection especially a viral infection, such as chickenpox,  cold sores, herpes Liver disease Recent or ongoing radiation An unusual or allergic reaction to doxorubicin, soybeans, other medications, foods, dyes, or preservatives If you or your partner are pregnant or trying to get pregnant Breast-feeding How should I use this medication? This medication is injected into a vein. It is given by your care team  in a hospital or clinic setting. Talk to your care team about the use of this medication in children. Special care may be needed. Overdosage: If you think you have taken too much of this medicine contact a poison control center or emergency room at once. NOTE: This medicine is only for you. Do not share this medicine with others. What if I miss a dose? Keep appointments for follow-up doses. It is important not to miss your dose. Call your care team if you are unable to keep an appointment. What may interact with this medication? Do not take this medication with any of the following: Zidovudine This medication may also interact with the following: Medications to increase blood counts, such as filgrastim, pegfilgrastim, sargramostim Vaccines This list may not describe all possible interactions. Give your health care provider a list of all the medicines, herbs, non-prescription drugs, or dietary supplements you use. Also tell them if you smoke, drink alcohol, or use illegal drugs. Some items may interact with your medicine. What should I watch for while using this medication? Your condition will be monitored carefully while you are receiving this medication. You may need blood work while taking this medication. This medication may make you feel generally unwell. This is not uncommon as chemotherapy can affect healthy cells as well as cancer cells. Report any side effects. Continue your course of treatment even though you feel ill unless your care team tells you to stop. Your urine may turn orange-red for a few days after your dose. This is not blood. If your urine is dark or brown, call your care team. In some cases, you may be given additional medications to help with side effects. Follow all directions for their use. Talk to your care team about your risk of cancer. You may be more at risk for certain types of cancers if you take this medication. Talk to your care team if you or your partner may be  pregnant. Serious birth defects can occur if you take this medication during pregnancy and for 6 months after the last dose. Contraception is recommended while taking this medication and for 6 months after the last dose. Your care team can help you find the option that works for you. If your partner can get pregnant, use a condom while taking this medication and for 6 months after the last dose. Do not breastfeed while taking this medication. This medication may cause infertility. Talk to your care team if you are concerned about your fertility. What side effects may I notice from receiving this medication? Side effects that you should report to your care team as soon as possible: Allergic reactions--skin rash, itching, hives, swelling of the face, lips, tongue, or throat Heart failure--shortness of breath, swelling of the ankles, feet, or hands, sudden weight gain, unusual weakness or fatigue Infection--fever, chills, cough, sore throat, wounds that don't heal, pain or trouble when passing urine, general feeling of discomfort or being unwell Infusion reactions--chest pain, shortness of breath or trouble breathing, feeling faint or lightheaded Low red blood cell level--unusual weakness or fatigue, dizziness, headache, trouble breathing Redness, swelling, and blistering of the skin over hands and feet Unusual bruising  or bleeding Side effects that usually do not require medical attention (report to your care team if they continue or are bothersome): Constipation Diarrhea Loss of appetite Nausea Pain, redness, or swelling with sores inside the mouth or throat Red urine Unusual weakness or fatigue This list may not describe all possible side effects. Call your doctor for medical advice about side effects. You may report side effects to FDA at 1-800-FDA-1088. Where should I keep my medication? This medication is given in a hospital or clinic. It will not be stored at home. NOTE: This sheet is a  summary. It may not cover all possible information. If you have questions about this medicine, talk to your doctor, pharmacist, or health care provider.  2024 Elsevier/Gold Standard (2023-02-22 00:00:00)

## 2024-01-21 NOTE — Progress Notes (Signed)
Per Dr. Pamelia Hoit, OK to treat today with total bilirubin 2.1.

## 2024-01-21 NOTE — Progress Notes (Signed)
Pt scheduled for lymphedema assessment at [facility] rehab today at 2 PM. She is aware.

## 2024-01-21 NOTE — Therapy (Signed)
OUTPATIENT PHYSICAL THERAPY  UPPER EXTREMITY ONCOLOGY EVALUATION  Patient Name: April [name] MRN: [id number] DOB:12-25-1960, 63 y.o., female Today's Date: 01/21/2024  END OF SESSION:  PT End of Session - 01/21/24 1715     Visit Number 1    Number of Visits 19    Date for PT Re-Evaluation 03/03/24    Authorization Type none needed    PT Start Time 1406    PT Stop Time 1458    PT Time Calculation (min) 52 min    Activity Tolerance Patient tolerated treatment well    Behavior During Therapy [facility] for tasks assessed/performed             Past Medical History:  Diagnosis Date   Allergy 07/28/2012   Seasonal/Environmental allergies   Anxiety 2013   Since 2013   Arthritis 2014 per patient    knees and shoulders   Bilateral ankle fractures 07/2015   Booted and resolved    Cancer [facility]) dx June 22, 2016   right breast   Depression 2013   Multiple  episodes  in past.   Elevated cholesterol 2017   Fibromyalgia 2013   diagnosed by Dr. Corliss Skains   Genital herpes 2005   Has outbreaks monthly if not on preventative medication   GERD (gastroesophageal reflux disease) 2013   History of radiation therapy 02/07/17- 03/21/17   Right Breast- 4 field 25 fractions. 50 Gy to SCLV/PAB in 25 fractions. Right Breast Boost 10 gy in 5 fractions.   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 [facility])    Metastatic adenocarcinoma to liver [facility])    Migraine 2013   migraines   Neuromuscular disorder (HCC) 03/20/2017   neuropathy in fingers and toes from Chemo--intermittent   Obesity    Osteoporosis 03/23/2017   noted per bone density scan   Peripheral neuropathy 08/13/2017   Personal history of chemotherapy 11/2016   Personal history of radiation therapy    4/18   Right wrist fracture 06/2015   Resolved   Scoliosis of thoracic spine 01/04/2012   Skin condition 2012   patient reports periodic episodes of severe itching. She will itch and then  blister at areas including her arms, back, and buttocks.    Urinary, incontinence, stress female 07/14/2016   patient reported   Past Surgical History:  Procedure Laterality Date   AXILLARY LYMPH NODE DISSECTION Right 12/26/2016   Procedure: RIGHT AXILLARY LYMPH NODE DISSECTION;  Surgeon: Glenna Fellows, MD;  Location: [facility];  Service: General;  Laterality: Right;   BREAST LUMPECTOMY Right 2018   BREAST LUMPECTOMY WITH NEEDLE LOCALIZATION Right 12/19/2016   Procedure: RIGHT BREAST NEEDLE LOCALIZED LUMPECTOMY, RIGHT RADIOACTIVE SEED TARGETED AXILLARY SENTINEL LYMPH NODE BIOPSY;  Surgeon: Glenna Fellows, MD;  [facility];  Service: General;  Laterality: Right;   IR ANGIOGRAM SELECTIVE EACH ADDITIONAL VESSEL  08/27/2023   IR ANGIOGRAM VISCERAL SELECTIVE  08/27/2023   IR EMBO ARTERIAL NOT HEMORR HEMANG INC GUIDE ROADMAPPING  08/17/2023   IR EMBO TUMOR ORGAN ISCHEMIA INFARCT INC GUIDE ROADMAPPING  08/27/2023   IR EMBO TUMOR ORGAN ISCHEMIA INFARCT INC GUIDE ROADMAPPING  10/08/2023   IR GENERIC HISTORICAL  10/09/2016   IR CV LINE INJECTION 10/09/2016 Irish Lack, MD [facility]   IR IMAGING GUIDED PORT INSERTION  06/14/2022   IR RADIOLOGIST EVAL & MGMT  08/02/2023   IR US GUIDE VASC ACCESS RIGHT  08/27/2023   LAPAROSCOPIC APPENDECTOMY N/A  11/28/2018   Procedure: APPENDECTOMY LAPAROSCOPIC;  Surgeon: Berna Bue, MD;  Location: [facility];  Service: General;  Laterality: N/A;   PORT-A-CATH REMOVAL Left 12/19/2016   Procedure: REMOVAL PORT-A-CATH;  Surgeon: Glenna Fellows, MD;  Location: [facility];  Service: General;  Laterality: Left;   PORTA CATH INSERTION  2021   PORTACATH PLACEMENT N/A 07/11/2016   Procedure: INSERTION PORT-A-CATH;  Surgeon: Glenna Fellows, MD;  Location[facility];  Service: General;  Laterality: N/A;   RADIOACTIVE SEED GUIDED AXILLARY SENTINEL LYMPH NODE Right 12/19/2016   Procedure: RADIOACTIVE SEED GUIDED  AXILLARY SENTINEL LYMPH NODE BIOPSY;  Surgeon: Glenna Fellows, MD;  Location: [facility];  Service: General;  Laterality: Right;   WISDOM TOOTH EXTRACTION  yrs ago   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) 11/09/2021   Stomatitis 08/10/2020   Varicose veins of bilateral lower extremities with other complications 07/27/2019   Dry skin dermatitis 07/27/2019   Appendicitis 11/28/2018   De Quervain's disease (tenosynovitis) 05/15/2018   Bilateral carpal tunnel syndrome 05/15/2018   Neuropathy due to chemotherapeutic drug (HCC) 02/11/2018   Osteoporosis 03/23/2017   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) 06/21/2016   Malignant neoplasm of upper-outer quadrant of right breast in female, estrogen receptor positive (HCC) 06/21/2016   Elevated blood pressure reading without diagnosis of hypertension 04/28/2016   Herpes genitalis--since 2005 per patient    Environmental and seasonal allergies 07/28/2012   Fibromyalgia    Anxiety 02/07/2012   Depression 01/04/2012   Headache-migranes 01/04/2012    PCP: Julieanne Manson, MD  REFERRING PROVIDER: Lillard Anes, NP  REFERRING DIAG:   THERAPY DIAG:  Malignant neoplasm of upper-outer quadrant of right breast in female, estrogen receptor positive (HCC)  Malignant neoplasm of right breast, stage 4 (HCC)  Lymphedema, not elsewhere classified  ONSET DATE: 2018  Rationale for Evaluation and Treatment: Rehabilitation  SUBJECTIVE:                                                                                                                                                                                           SUBJECTIVE STATEMENT: My arm is just starting to get big.   PERTINENT HISTORY: treated here previously - has flexitouch. Rt breast  cancer, R lumpectomy 01-04-17 with SLNB (3 were positive), then had ALNB (20 some were postive), pt completed chemo and radiation, then pt had recurrence in May 2021 and is stage IV, it has spead to  lymph nodes,liver, bone (L2, Rt ischium, L1), lung. Started new treatment with Doxil 01/21/24. I do have active cancer in the breast.    Imaging 11/26/23: Increased anasarca, particularly involving the right upper extremity and right chest wall. Trace ascites, without directly visible peritoneal metastatic Disease Pleural effusion   PAIN:  Are you having pain? Yes NPRS scale: 3/10 Pain location: Rt arm  Pain orientation: Right  PAIN TYPE: aching Pain description: constant  Aggravating factors: cleaning, using the arm  Relieving factors: maybe the gabapentin   PRECAUTIONS: Active cancer Rt breast, bone mets  RED FLAGS: None   WEIGHT BEARING RESTRICTIONS: No  FALLS:  Has patient fallen in last 6 months? No I am very unsteady standing on my own, like I have to lean on the wall when I shower.    LIVING ENVIRONMENT: Lives with: lives alone  OCCUPATION: retired  LEISURE: not reported   HAND DOMINANCE: right   PRIOR LEVEL OF FUNCTION: Independent with basic ADLs  PATIENT GOALS: reduce and manage swelling    OBJECTIVE: Note: Objective measures were completed at Evaluation unless otherwise noted.  COGNITION: Overall cognitive status: Within functional limits for tasks assessed   PALPATION: +1-2 pitting Rt hand to elbow  OBSERVATIONS / OTHER ASSESSMENTS: Rt breast with bumpiness/nodules that pt states they think is cancer starting to show through the breast.    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  POSTURE: WNL  UPPER EXTREMITY AROM/PROM:  A/PROM RIGHT   eval   Shoulder extension   Shoulder flexion   Shoulder abduction   Shoulder internal rotation   Shoulder external rotation     (Blank rows = not tested)  A/PROM LEFT   eval  Shoulder extension    Shoulder flexion   Shoulder abduction   Shoulder internal rotation   Shoulder external rotation     (Blank rows = not tested)  UPPER EXTREMITY STRENGTH:   LYMPHEDEMA ASSESSMENTS:   LANDMARK RIGHT  eval  At axilla  48  15 cm proximal to olecranon process 47.6  10 cm proximal to olecranon process 47  Olecranon process 37 - 42.5  15 cm proximal to ulnar styloid process 38.4  10 cm proximal to ulnar styloid process 34.8  Just proximal to ulnar styloid process 19.7  Across hand at thumb web space 21.4  At base of 2nd digit 7.1  (Blank rows = not tested)  LANDMARK LEFT  eval  At axilla  34.8  15 cm proximal to olecranon process 33.5  10 cm proximal to olecranon process 33.4  Olecranon process 33  15 cm proximal to ulnar styloid process 28.5  10 cm proximal to ulnar styloid process 25.4  Just proximal to ulnar styloid process 17.3  Across hand at thumb web space 20.5  At base of 2nd digit 6.7  (Blank rows = not tested)  10cm Volume on eval:  Rt arm    Lt arm  TREATMENT DATE:  01/21/24 Eval performed Discussed POC options including bandaging, velcro reduction kit, and night garments as well as edema wear Gave handout on edema wear size Med Applied tg soft medium with large fold over gauntlet that pt had previously.   Discussed not sleeping in her current old sleeve    PATIENT EDUCATION:  Education details: per today's note Person educated: Patient Education method: Chief Technology Officer Education comprehension: verbalized understanding  HOME EXERCISE PROGRAM: May try flexitouch again  ASSESSMENT:  CLINICAL IMPRESSION: Patient is a 63 y.o. female who was seen today for physical therapy evaluation and treatment for her Rt chronic lymphedema. She was seen previously when her arm was mildly swollen and was measured for a custom  JOBST sleeve and gauntlet.  She also received a flexitouch pump.  She now has more advanced disease with Rt anasarca and ascites as well as pleural effusion with most likely disease progression superficially in the Rt breast per patient as she is noting itchy nodules and bumps on the breast.  Pt had been wearing her old garment day and night so she was advised not to do this and the garment was mostly stopping at the elbow and causing a tourniquet at the elbow and wrist so she was advised not to use this much as well.  Started with tg soft combined with the gauntlet for now.  Discussed options and pt will start with bandaging and order velcro as fast as possible with transition to velcro.  Due to size reduction kit will most likely be the best option.     OBJECTIVE IMPAIRMENTS: decreased knowledge of condition, decreased knowledge of use of DME, decreased ROM, increased edema, and increased fascial restrictions.   ACTIVITY LIMITATIONS: carrying and lifting  PARTICIPATION LIMITATIONS: community activity  PERSONAL FACTORS: Fitness, Past/current experiences, and 1-2 comorbidities: active cancer, space occupying lesion  are also affecting patient's functional outcome.   REHAB POTENTIAL: Good  CLINICAL DECISION MAKING: Evolving/moderate complexity  EVALUATION COMPLEXITY: Moderate  GOALS: Goals reviewed with patient? Yes  SHORT TERM GOALS=LTGs: Target date: 03/03/24  Pt will obtain velcro compression for long term management of her lymphedema  Baseline: Goal status: INITIAL  2.  Pt will return to use of her flexitouch with help from her daughter if able  Baseline:  Goal status: INITIAL   PLAN:  PT FREQUENCY: 3x/week  PT DURATION: 6 weeks  PLANNED INTERVENTIONS: 97164- PT Re-evaluation, 97110-Therapeutic exercises, 97530- Therapeutic activity, 97535- Self Care, 16109- Manual therapy, Patient/Family education, Therapeutic exercises, Therapeutic activity, Neuromuscular re-education, Gait  training, and Self Care  PLAN FOR NEXT SESSION: Rt UE CDT - may start lighter at first for tolerance Order velcro through clover ASAP - double check sizing  Idamae Lusher, PT 01/21/2024, 5:19 PM

## 2024-01-21 NOTE — Assessment & Plan Note (Signed)
1.  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 bone metastasis: Currently on Xgeva every 3 months.  03/28/2023: CA 27-29: 84.4 04/19/2023: CA 27-29: 126 06/22/2023: CA 27-29: 204 09/13/2023: CA 27-29: 329 11/06/2023: CA 27-29: 23  [facility] molecular testing: ER positive, ER positive, TMB 5, PD-L1 negative, PIK 3 CA negative  -------------------------------------------------------- Current treatment: Doxil every 4 weeks, today cycle 1 Echocardiogram 01/11/2024: EF 60 to 65% Return to clinic in 4 weeks for cycle 2

## 2024-01-21 NOTE — Progress Notes (Signed)
Patient Care Team: Julieanne Manson, MD as PCP - General (Internal Medicine) Maisie Fus, MD as PCP - Cardiology (Cardiology) Lonie Peak, MD as Attending Physician (Radiation Oncology) Axel Filler, Larna Daughters, NP as Nurse Practitioner (Hematology and Oncology) Berna Bue, MD as Consulting Physician (General Surgery) Felecia Shelling, DPM as Consulting Physician (Podiatry) Graylin Shiver, MD as Consulting Physician (Gastroenterology) Serena Croissant, MD as Attending Physician (Hematology and Oncology)  DIAGNOSIS:  Encounter Diagnoses  Name Primary?   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)   06/21/2022 - 08/03/2022 Chemotherapy   Patient is on Treatment Plan : BREAST METASTATIC fam-trastuzumab deruxtecan-nxki (Enhertu) q21d     06/21/2022 - 04/19/2023 Chemotherapy   Patient is on Treatment Plan : BREAST METASTATIC Fam-Trastuzumab Deruxtecan-nxki (Enhertu) (5.4) q21d     05/18/2023 - 07/06/2023 Chemotherapy   Patient is on Treatment Plan : BREAST METASTATIC Sacituzumab govitecan-hziy [name]) D1,8 q21d     10/16/2023 - 01/05/2024 Chemotherapy   Patient is on Treatment Plan : BREAST METASTATIC Eribulin D1,8 q21d     01/15/2024 -  Chemotherapy   Patient is on Treatment Plan : BREAST Liposomal Doxorubicin (50) q28d     Malignant neoplasm of upper-outer quadrant of right breast in female, estrogen receptor positive (HCC)  06/21/2016 Initial Diagnosis   Malignant neoplasm of upper-outer quadrant of right breast in female, estrogen receptor positive (HCC)   06/21/2016 Initial Biopsy    Right breast biopsy, 10 oclock: IDC, grade 3, ER+(95%), PR+(80%),Ki67 20%, HER-2 negative (ratio 0.67). Right axilla core biopsy: carcinoma, grade 3, ER+(100%), PR+(90%), Ki67 25%, HER-2 negative (ratio 1.13).    07/17/2016 - 11/06/2016 Neo-Adjuvant Chemotherapy   Received 2 cycles of Doxorubicin and Cyclophosphamide, then transitioned to weekly Paclitaxel (due to repeated febrile neutropenia) x 7 cycles, stopped early due to peripheral neuropathy, then completed her final 2 cycles of Doxorubicin and Cyclophosphamide.    12/19/2016 Surgery   Right breast lumpectomy ([facility]): IDC, grade 2, 1.6cm and 0.3cm, margins negative, 3 SLN positive for metastatic carcinoma.     12/26/2016 Surgery   ALND: metastatic carcinoma in one of 20 lymph nodes, and three nodes from previous lumpectomy.  Four positive nodes, consistent with pN2a.   02/07/2017 - 03/21/2017 Radiation Therapy   Adjuvant radiation Bas[facility] [place]): 1) Right breast and nodes - 4 field: 50 Gy in 25 fractions. IM NODES: >95% receive at least 45Gy/13fx. 50Gy to SCLV/PAB @ 2Gy /fraction x 25 fractions. 2) Right breast boost: 10 Gy in 5 fractions   04/2017 -  Anti-estrogen oral therapy   Anastrozole 1mg  daily.  Bone density 03/23/2017 finds T score of -2.6, osteoporosis, plan to start Prolia following dental clearance Anastrozole stopped 01/16/18 Exemestane 25 mg daily 02/12/18  On [name], trial randomized to endocrine therapy alone   05/27/2020 Progression   chest CT scan 05/27/2020 shows bulky mediastinal and right hilar lymphadenopathy with right pleural nodules and a small right pleural effusion, no evidence of liver or bone involvement             (a) biopsy of right breast mass 06/02/2020 shows invasive ductal carcinoma, estrogen and progesterone receptor positive, HER-2  not amplified, with an MIB-1 of 40%   06/17/2020 Treatment Plan Change    fulvestrant to start 06/17/2020             (a) palbociclib to start 06/17/2020 at 125 mg daily 21 days on 7  days off             (b) palbociclib decreased to 125mg  every other day x 11 doses on 07/23/2020             (c) palbociclib dose decreased to100 mg daily, 21/7, starting with September cycle             (d) Palbociclib decreased to 75mg  daily beginning with March cycle due to oral ulcers             (e) palbociclib discontinued November 2022 with progression   09/14/2021 PET scan   PET scan 09/14/2021 shows progression in liver and bone   10/05/2021 Pathology Results   liver biopsy 10/05/2021 confirms metastatic carcinoma, estrogen receptor strongly positive, progesterone receptor and HER2 negative, with an MIB-1 of 30%.   10/2021 Treatment Plan Change   fulvestrant continued, Xgeva added, palbociclib changed to abemaciclib November 2022 -Dose Decreased to 50mg  PO BID Daily   03/07/2022 Miscellaneous   [facility] molecular testing: ER positive, ER positive, MTAP: Detected, ESR 1 negative, BRCA 1 and 2 negative, MSI stable, PD-L1 negative, PIK 3 CA negative, PR negative,   06/21/2022 - 08/03/2022 Chemotherapy   Patient is on Treatment Plan : BREAST METASTATIC fam-trastuzumab deruxtecan-nxki (Enhertu) q21d     06/21/2022 - 04/19/2023 Chemotherapy   Patient is on Treatment Plan : BREAST METASTATIC Fam-Trastuzumab Deruxtecan-nxki (Enhertu) (5.4) q21d     01/25/2023 Cancer Staging   Staging form: Breast, AJCC 7th Edition - Pathologic: Stage IV (M1) - Signed by Loa Socks, NP on 01/25/2023   05/18/2023 - 07/06/2023 Chemotherapy   Patient is on Treatment Plan : BREAST METASTATIC Sacituzumab govitecan-hziy [name]) D1,8 q21d     07/23/2023 -  Chemotherapy   Xeloda 1000 mg bid 14 days on and 7 days off   08/27/2023 Procedure   Y90 hepatic radio embolization   10/16/2023 - 01/05/2024 Chemotherapy   Patient is on Treatment Plan : BREAST METASTATIC Eribulin D1,8 q21d     01/15/2024 -  Chemotherapy   Patient is on Treatment Plan : BREAST Liposomal Doxorubicin (50) q28d     Breast cancer,  stage 4 (HCC)  03/09/2022 Initial Diagnosis   Breast cancer, stage 4 (HCC)   06/21/2022 - 08/03/2022 Chemotherapy   Patient is on Treatment Plan : BREAST METASTATIC fam-trastuzumab deruxtecan-nxki (Enhertu) q21d     06/21/2022 - 04/19/2023 Chemotherapy   Patient is on Treatment Plan : BREAST METASTATIC Fam-Trastuzumab Deruxtecan-nxki (Enhertu) (5.4) q21d     05/18/2023 - 07/06/2023 Chemotherapy   Patient is on Treatment Plan : BREAST METASTATIC Sacituzumab govitecan-hziy [name]) D1,8 q21d     10/16/2023 - 01/05/2024 Chemotherapy   Patient is on Treatment Plan : BREAST METASTATIC Eribulin D1,8 q21d     01/15/2024 -  Chemotherapy   Patient is on Treatment Plan : BREAST Liposomal Doxorubicin (50) q28d       CHIEF COMPLIANT: Cycle 1 Doxil  HISTORY OF PRESENT ILLNESS:   History of Present Illness   April [name] is a 63 year old female undergoing chemotherapy who presents with numbness and difficulty with daily activities. shes here to start new treatment with Doxil.  She is currently undergoing chemotherapy treatment, with her last session on  December 17, 2023. She received a WBC booster shot on January 05, 2024, to increase her white blood cell count, but her numbers remain suboptimal. Her neutrophil count is 1.5, and her platelets are borderline at 92, though her hemoglobin is stable.  She experiences significant numbness in her fingers, impacting her ability to perform daily tasks such as putting on her sleeve and fastening her seatbelt. She describes a lack of strength and pronounced numbness, particularly in one hand, although she can drive without issues as her legs are unaffected. Stretching her hand causes pain.  She is taking Lasix twice a day for swelling but has encountered issues with her prescription refill due to a previous cancellation. She is also taking potassium supplements twice a day to counteract potential depletion from Lasix use.         ALLERGIES:  is allergic to  eribulin mesylate, codeine, cymbalta [duloxetine hcl], hydrocodone, ultram [tramadol hcl], venlafaxine, and gabapentin.  MEDICATIONS:  Current Outpatient Medications  Medication Sig Dispense Refill   carvedilol (COREG) 3.125 MG tablet TAKE 1 TABLET BY MOUTH 2 TIMES DAILY. 60 tablet 0   Cetirizine HCl (ZYRTEC ALLERGY PO) Take 10 mg by mouth daily.     cyclobenzaprine (FLEXERIL) 5 MG tablet Take 1 tablet (5 mg total) by mouth 3 (three) times daily as needed for muscle spasms. 30 tablet 0   Denosumab (XGEVA [place]) Inject 120 mg into the skin every 3 (three) months.     furosemide (LASIX) 40 MG tablet Take 1 tablet (40 mg total) by mouth 2 (two) times daily. 60 tablet 6   gabapentin (NEURONTIN) 100 MG capsule Take 1 capsule (100 mg total) by mouth 3 (three) times daily for 3 days, THEN 2 capsules (200 mg total) 3 (three) times daily for 3 days, THEN 3 capsules (300 mg total) 3 (three) times daily. 297 capsule 0   KLOR-CON M10 10 MEQ tablet TAKE 1 TABLET BY MOUTH EVERY DAY 90 tablet 1   lidocaine-prilocaine (EMLA) cream Apply to affected area once 30 g 3   naproxen sodium (ALEVE) 220 MG tablet Take 440-660 mg by mouth 2 (two) times daily as needed (pain).     omeprazole (PRILOSEC) 20 MG capsule Take 20 mg by mouth daily.     ondansetron (ZOFRAN) 8 MG tablet Take 1 tablet (8 mg total) by mouth every 8 (eight) hours as needed for nausea or vomiting. 30 tablet 1   prochlorperazine (COMPAZINE) 10 MG tablet Take 1 tablet (10 mg total) by mouth every 6 (six) hours as needed for nausea or vomiting. 30 tablet 1   rizatriptan (MAXALT) 5 MG tablet Take 1 tablet (5 mg total) by mouth as needed for migraine. May repeat in 2 hours if needed 10 tablet 0   valACYclovir (VALTREX) 1000 MG tablet Take 1 tablet (1,000 mg total) by mouth 2 (two) times daily. 1/2 tab BID (Patient taking differently: Take 500 mg by mouth See admin instructions. Take 500 mg daily, may take a second 500 mg dose as needed for outbreaks) 180 tablet  4   No current facility-administered medications for this visit.    PHYSICAL EXAMINATION: ECOG PERFORMANCE STATUS: 1 - Symptomatic but completely ambulatory  Vitals:   01/21/24 0837  BP: 117/69  Pulse: 91  Resp: 17  Temp: 98.3 F (36.8 C)  SpO2: 97%   Filed Weights   01/21/24 0837  Weight: 218 lb 14.4 oz (99.3 kg)    Physical Exam abdominal exam: Slight tenderness in the right upper  outer quadrant  Profound peripheral neuropathy especially in the hands       LABORATORY DATA:  I have reviewed the data as listed    Latest Ref Rng & Units 01/08/2024   12:33 PM 12/17/2023    2:18 PM 11/26/2023    8:39 AM  CMP  Glucose 70 - 99 mg/dL 161  83  096   BUN 8 - 23 mg/dL 10  10  8    Creatinine 0.44 - 1.00 mg/dL 0.45  4.09  8.11   Sodium 135 - 145 mmol/L 140  139  140   Potassium 3.5 - 5.1 mmol/L 3.0  3.8  3.5   Chloride 98 - 111 mmol/L 111  109  112   CO2 22 - 32 mmol/L 25  27  23    Calcium 8.9 - 10.3 mg/dL 8.1  8.7  8.0   Total Protein 6.5 - 8.1 g/dL 5.4  5.2  5.1   Total Bilirubin 0.0 - 1.2 mg/dL 2.1  2.0  2.3   Alkaline Phos 38 - 126 U/L 262  266  298   AST 15 - 41 U/L 46  47  47   ALT 0 - 44 U/L 19  20  19      Lab Results  Component Value Date   WBC 2.1 (L) 01/21/2024   HGB 11.3 (L) 01/21/2024   HCT 32.9 (L) 01/21/2024   MCV 107.2 (H) 01/21/2024   PLT 92 (L) 01/21/2024   NEUTROABS 1.5 (L) 01/21/2024    ASSESSMENT & PLAN:  Malignant neoplasm of upper-outer quadrant of right breast in female, estrogen receptor positive (HCC) 1.  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 bone metastasis: Currently on Xgeva every 3 months.  03/28/2023: CA 27-29: 84.4 04/19/2023: CA 27-29: 126 06/22/2023: CA 27-29: 204 09/13/2023: CA 27-29: 329 11/06/2023: CA 27-29: 29  [facility] molecular testing: ER positive, ER positive, TMB 5, PD-L1 negative, PIK  3 CA negative  -------------------------------------------------------- Current treatment: Doxil every 4 weeks, today cycle 1 Echocardiogram 01/11/2024: EF 60 to 65% Return to clinic in 4 weeks for cycle 2 Okay to treat with ANC of 1500 and platelets of 92 We will add Granix injection on Saturdays before each treatment.  Edema Experiencing swelling, currently on Lasix. Issue with prescription refill due to pharmacy misunderstanding. Discussed increasing Lasix to 40 mg twice a day. Potential for potassium depletion noted. - Prescribe Lasix 40 mg, twice a day - Monitor potassium levels - Continue potassium supplements twice a day  General Health Maintenance Echocardiogram normal with an ejection fraction of 65%. - Continue current heart medication  Follow-up - Schedule follow-up appointments and booster shot before next infusion.          Orders Placed This Encounter  Procedures   CBC with Differential (Cancer Center Only)    Standing Status:   Future    Expected Date:   02/12/2024    Expiration Date:   02/11/2025   CMP (Cancer Center only)    Standing Status:   Future    Expected Date:   02/12/2024    Expiration Date:   02/11/2025   CBC with Differential (Cancer Center Only)    Standing Status:   Future    Expected Date:   03/11/2024    Expiration Date:   03/11/2025   CMP (Cancer Center only)    Standing Status:   Future    Expected Date:  03/11/2024    Expiration Date:   03/11/2025   CBC with Differential (Cancer Center Only)    Standing Status:   Future    Expected Date:   04/08/2024    Expiration Date:   04/08/2025   CMP (Cancer Center only)    Standing Status:   Future    Expected Date:   04/08/2024    Expiration Date:   04/08/2025   CBC with Differential (Cancer Center Only)    Standing Status:   Future    Expected Date:   05/06/2024    Expiration Date:   05/06/2025   CMP (Cancer Center only)    Standing Status:   Future    Expected Date:   05/06/2024    Expiration Date:    05/06/2025   CBC with Differential (Cancer Center Only)    Standing Status:   Future    Expected Date:   06/03/2024    Expiration Date:   06/03/2025   CMP (Cancer Center only)    Standing Status:   Future    Expected Date:   06/03/2024    Expiration Date:   06/03/2025   The patient has a good understanding of the overall plan. she agrees with it. she will call with any problems that may develop before the next visit here. Total time spent: 30 mins including face to face time and time spent for planning, charting and co-ordination of care   Tamsen Meek, MD 01/21/24

## 2024-01-21 NOTE — Progress Notes (Signed)
Decrease Doxil to 37.5mg /m2 per Dr Pamelia Hoit

## 2024-01-22 LAB — CANCER ANTIGEN 27.29: CA 27.29: 64.1 U/mL — ABNORMAL HIGH (ref 0.0–38.6)

## 2024-01-23 NOTE — Addendum Note (Signed)
Encounter addended by: Dagoberto Reef on: 01/23/2024 12:12 PM  Actions taken: Imaging Exam begun

## 2024-01-25 ENCOUNTER — Encounter: Payer: Self-pay | Admitting: Rehabilitation

## 2024-01-25 ENCOUNTER — Ambulatory Visit: Payer: Medicaid Other | Admitting: Rehabilitation

## 2024-01-25 DIAGNOSIS — I890 Lymphedema, not elsewhere classified: Secondary | ICD-10-CM

## 2024-01-25 DIAGNOSIS — C50411 Malignant neoplasm of upper-outer quadrant of right female breast: Secondary | ICD-10-CM | POA: Diagnosis not present

## 2024-01-25 DIAGNOSIS — C50911 Malignant neoplasm of unspecified site of right female breast: Secondary | ICD-10-CM

## 2024-01-25 DIAGNOSIS — Z170 Estrogen receptor positive status [ER+]: Secondary | ICD-10-CM

## 2024-01-25 NOTE — Therapy (Signed)
OUTPATIENT PHYSICAL THERAPY  UPPER EXTREMITY ONCOLOGY   Patient Name: April [name] MRN: [id number] DOB:October 20, 1961, 62 y.o., female Today's Date: 01/25/2024  END OF SESSION:  PT End of Session - 01/25/24 0959     Visit Number 2    Number of Visits 19    Date for PT Re-Evaluation 03/03/24    PT Start Time 0900    PT Stop Time 0955    PT Time Calculation (min) 55 min    Activity Tolerance Patient tolerated treatment well    Behavior During Therapy [facility] for tasks assessed/performed              Past Medical History:  Diagnosis Date   Allergy 07/28/2012   Seasonal/Environmental allergies   Anxiety 2013   Since 2013   Arthritis 2014 per patient    knees and shoulders   Bilateral ankle fractures 07/2015   Booted and resolved    Cancer [facility]) dx June 22, 2016   right breast   Depression 2013   Multiple  episodes  in past.   Elevated cholesterol 2017   Fibromyalgia 2013   diagnosed by Dr. Corliss Skains   Genital herpes 2005   Has outbreaks monthly if not on preventative medication   GERD (gastroesophageal reflux disease) 2013   History of radiation therapy 02/07/17- 03/21/17   Right Breast- 4 field 25 fractions. 50 Gy to SCLV/PAB in 25 fractions. Right Breast Boost 10 gy in 5 fractions.   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 She will itch and then blister at areas including her arms, back,  and buttocks.    Urinary, incontinence, stress female 07/14/2016   patient reported   Past Surgical History:  Procedure Laterality Date   AXILLARY LYMPH NODE DISSECTION Right 12/26/2016   Procedure: RIGHT AXILLARY LYMPH NODE DISSECTION;  Surgeon: Glenna Fellows, MD;  Location: [facility];  Service: General;  Laterality: Right;   BREAST LUMPECTOMY Right 2018   BREAST LUMPECTOMY WITH NEEDLE LOCALIZATION Right 12/19/2016   Procedure: RIGHT BREAST NEEDLE LOCALIZED LUMPECTOMY, RIGHT RADIOACTIVE SEED TARGETED AXILLARY SENTINEL LYMPH NODE BIOPSY;  Surgeon: Glenna Fellows, MD;  Location: [facility];  Service: General;  Laterality: Right;   IR ANGIOGRAM SELECTIVE EACH ADDITIONAL VESSEL  08/27/2023   IR ANGIOGRAM VISCERAL SELECTIVE  08/27/2023   IR EMBO ARTERIAL NOT HEMORR HEMANG INC GUIDE ROADMAPPING  08/17/2023   IR EMBO TUMOR ORGAN ISCHEMIA INFARCT INC GUIDE ROADMAPPING  08/27/2023   IR EMBO TUMOR ORGAN ISCHEMIA INFARCT INC GUIDE ROADMAPPING  10/08/2023   IR GENERIC HISTORICAL  10/09/2016   IR CV LINE INJECTION 10/09/2016 Irish Lack, MD [facility]-INTER[facility] RAD   IR IMAGING GUIDED PORT INSERTION  06/14/2022   IR RADIOLOGIST EVAL & MGMT  08/02/2023   IR US GUIDE VASC ACCESS RIGHT  08/27/2023   LAPAROSCOPIC APPENDECTOMY N/A 11/28/2018   Procedure: APPENDECTOMY LAPAROSCOPIC;  Surgeon: Berna Bue, MD;  Location: [facility] OR;  Service: General;  Laterality: N/A;   PORT-A-CATH REMOVAL Left 12/19/2016   Procedure: REMOVAL PORT-A-CATH;  Surgeon: Glenna Fellows, MD;  Location: [facility];  Service: General;  Laterality: Left;   PORTA CATH INSERTION  2021   PORTACATH PLACEMENT N/A 07/11/2016   Procedure: INSERTION PORT-A-CATH;  Surgeon: Glenna Fellows, MD;  Location: [facility];  Service: General;  Laterality: N/A;   RADIOACTIVE SEED GUIDED AXILLARY SENTINEL LYMPH NODE Right 12/19/2016   Procedure: RADIOACTIVE SEED GUIDED AXILLARY SENTINEL LYMPH NODE BIOPSY;   Surgeon: Glenna Fellows, MD;  Location: [facility];  Service: General;  Laterality: Right;   WISDOM TOOTH EXTRACTION  yrs ago   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) 11/09/2021   Stomatitis 08/10/2020   Varicose veins of bilateral lower extremities with other complications 07/27/2019   Dry skin dermatitis 07/27/2019   Appendicitis 11/28/2018   De Quervain's disease (tenosynovitis) 05/15/2018   Bilateral carpal tunnel syndrome 05/15/2018   Neuropathy due to chemotherapeutic drug (HCC) 02/11/2018   Osteoporosis 03/23/2017   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) 06/21/2016   Malignant neoplasm of upper-outer quadrant of right breast in female, estrogen receptor positive (HCC) 06/21/2016   Elevated blood pressure reading without diagnosis of hypertension 04/28/2016   Herpes genitalis--since 2005 per patient    Environmental and seasonal allergies 07/28/2012   Fibromyalgia    Anxiety 02/07/2012   Depression 01/04/2012   Headache-migranes 01/04/2012    PCP: Julieanne Manson, MD  REFERRING PROVIDER: Lillard Anes, NP  REFERRING DIAG:   THERAPY DIAG:  Malignant neoplasm of upper-outer quadrant of right breast in female, estrogen receptor positive (HCC)  Malignant neoplasm of right breast, stage 4 (HCC)  Lymphedema, not elsewhere classified  ONSET DATE: 2018  Rationale for Evaluation and Treatment: Rehabilitation  SUBJECTIVE:                                                                                                                                                                                           SUBJECTIVE STATEMENT: I wore my tg soft until I got it dirty.   PERTINENT HISTORY: treated here previously - has flexitouch. Rt breast cancer, R lumpectomy 01-04-17 with  SLNB (3 were positive), then had ALNB (20 some were postive), pt completed chemo and radiation, then pt had recurrence in May 2021 and is stage IV, it has spead to lymph nodes,liver, bone (L2, Rt  ischium, L1), lung. Started new treatment with Doxil 01/21/24. I do have active cancer in the breast.    Imaging 11/26/23: Increased anasarca, particularly involving the right upper extremity and right chest wall. Trace ascites, without directly visible peritoneal metastatic Disease Pleural effusion   PAIN:  Are you having pain? Yes NPRS scale: 3/10 Pain location: Rt arm  Pain orientation: Right  PAIN TYPE: aching Pain description: constant  Aggravating factors: cleaning, using the arm  Relieving factors: maybe the gabapentin   PRECAUTIONS: Active cancer Rt breast, bone mets  RED FLAGS: None   WEIGHT BEARING RESTRICTIONS: No  FALLS:  Has patient fallen in last 6 months? No I am very unsteady standing on my own, like I have to lean on the wall when I shower.    LIVING ENVIRONMENT: Lives with: lives alone  OCCUPATION: retired  LEISURE: not reported   HAND DOMINANCE: right   PRIOR LEVEL OF FUNCTION: Independent with basic ADLs  PATIENT GOALS: reduce and manage swelling    OBJECTIVE: Note: Objective measures were completed at Evaluation unless otherwise noted.  COGNITION: Overall cognitive status: Within functional limits for tasks assessed   PALPATION: +1-2 pitting Rt hand to elbow  OBSERVATIONS / OTHER ASSESSMENTS: Rt breast with bumpiness/nodules that pt states they think is cancer starting to show through the breast.    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  POSTURE: WNL  UPPER EXTREMITY AROM/PROM:  A/PROM RIGHT   eval   Shoulder extension   Shoulder flexion   Shoulder abduction   Shoulder internal rotation   Shoulder external rotation     (Blank rows = not tested)  A/PROM LEFT   eval  Shoulder extension   Shoulder flexion   Shoulder  abduction   Shoulder internal rotation   Shoulder external rotation     (Blank rows = not tested)  UPPER EXTREMITY STRENGTH:   LYMPHEDEMA ASSESSMENTS:   LANDMARK RIGHT  eval  At axilla  48  15 cm proximal to olecranon process 47.6  10 cm proximal to olecranon process 47  Olecranon process 37 - 42.5  15 cm proximal to ulnar styloid process 38.4  10 cm proximal to ulnar styloid process 34.8  Just proximal to ulnar styloid process 19.7  Across hand at thumb web space 21.4  At base of 2nd digit 7.1  (Blank rows = not tested)  LANDMARK LEFT  eval  At axilla  34.8  15 cm proximal to olecranon process 33.5  10 cm proximal to olecranon process 33.4  Olecranon process 33  15 cm proximal to ulnar styloid process 28.5  10 cm proximal to ulnar styloid process 25.4  Just proximal to ulnar styloid process 17.3  Across hand at thumb web space 20.5  At base of 2nd digit 6.7  (Blank rows = not tested)  10cm Volume on eval:  Rt arm    Lt arm  TREATMENT DATE:  01/25/24 In supine: Short neck, 5 diaphragmatic breaths, L axillary nodes and establishment of interaxillary pathway, R inguinal nodes and establishment of axilloinguinal pathway, then R UE working proximal to distal, moving inner upper arm outwards and upwards, and doing both sides of forearms, spending extra time in any areas of fibrosis then retracing all steps.  Then applied lighter bandage for first time: tg soft medium, fingers 1-5, artiflex hand to axilla (cut hand foam but did not use due to shaping okay with cotton), then 6cm hand bandage, 10 cm forearm into upper arm with elbow X, and another 10cm mid forearm to axilla.  Education on removal, showering, bandage care, and not allowing any discomfort.   01/21/24 Eval performed Discussed POC options including bandaging, velcro reduction kit, and  night garments as well as edema wear Gave handout on edema wear size Med Applied tg soft medium with large fold over gauntlet that pt had previously.   Discussed not sleeping in her current old sleeve    PATIENT EDUCATION:  Education details: per today's note Person educated: Patient Education method: Explanation and Handouts Education comprehension: verbalized understanding  HOME EXERCISE PROGRAM: May try flexitouch again  ASSESSMENT:  CLINICAL IMPRESSION: First day of CDT tolerated well.  Started bandaging.  Will most likely need foam and another bandage but will test tolerance first due to space occupying lesion.      OBJECTIVE IMPAIRMENTS: decreased knowledge of condition, decreased knowledge of use of DME, decreased ROM, increased edema, and increased fascial restrictions.   ACTIVITY LIMITATIONS: carrying and lifting  PARTICIPATION LIMITATIONS: community activity  PERSONAL FACTORS: Fitness, Past/current experiences, and 1-2 comorbidities: active cancer, space occupying lesion  are also affecting patient's functional outcome.   REHAB POTENTIAL: Good  CLINICAL DECISION MAKING: Evolving/moderate complexity  EVALUATION COMPLEXITY: Moderate  GOALS: Goals reviewed with patient? Yes  SHORT TERM GOALS=LTGs: Target date: 03/03/24  Pt will obtain velcro compression for long term management of her lymphedema  Baseline: Goal status: INITIAL  2.  Pt will return to use of her flexitouch with help from her daughter if able  Baseline:  Goal status: INITIAL   PLAN:  PT FREQUENCY: 3x/week  PT DURATION: 6 weeks  PLANNED INTERVENTIONS: 97164- PT Re-evaluation, 97110-Therapeutic exercises, 97530- Therapeutic activity, 97535- Self Care, 16109- Manual therapy, Patient/Family education, Therapeutic exercises, Therapeutic activity, Neuromuscular re-education, Gait training, and Self Care  PLAN FOR NEXT SESSION: Rt UE CDT - may start lighter at first for tolerance Order velcro  through clover ASAP - double check sizing  Idamae Lusher, PT 01/25/2024, 9:59 AM

## 2024-01-26 ENCOUNTER — Inpatient Hospital Stay: Payer: Medicaid Other

## 2024-01-28 ENCOUNTER — Ambulatory Visit: Payer: Medicaid Other | Admitting: Rehabilitation

## 2024-01-28 ENCOUNTER — Encounter: Payer: Self-pay | Admitting: Rehabilitation

## 2024-01-28 DIAGNOSIS — C50911 Malignant neoplasm of unspecified site of right female breast: Secondary | ICD-10-CM

## 2024-01-28 DIAGNOSIS — Z170 Estrogen receptor positive status [ER+]: Secondary | ICD-10-CM

## 2024-01-28 DIAGNOSIS — I890 Lymphedema, not elsewhere classified: Secondary | ICD-10-CM

## 2024-01-28 DIAGNOSIS — C50411 Malignant neoplasm of upper-outer quadrant of right female breast: Secondary | ICD-10-CM | POA: Diagnosis not present

## 2024-01-28 NOTE — Therapy (Addendum)
 OUTPATIENT PHYSICAL THERAPY  UPPER EXTREMITY ONCOLOGY   Patient Name: April [name] MRN: [id number] DOB:02/25/1961, 63 y.o., female Today's Date: 01/28/2024  END OF SESSION:  PT End of Session - 01/28/24 1136     Visit Number 3    Number of Visits 19    Date for PT Re-Evaluation 03/03/24    PT Start Time 1000    PT Stop Time 1035    PT Time Calculation (min) 35 min    Activity Tolerance Patient limited by lethargy;Treatment limited secondary to medical complications (Comment)    Behavior During Therapy [facility] for tasks assessed/performed              Past Medical History:  Diagnosis Date   Allergy 07/28/2012   Seasonal/Environmental allergies   Anxiety 2013   Since 2013   Arthritis 2014 per patient    knees and shoulders   Bilateral ankle fractures 07/2015   Booted and resolved    Cancer [facility]) dx June 22, 2016   right breast   Depression 2013   Multiple  episodes  in past.   Elevated cholesterol 2017   Fibromyalgia 2013   diagnosed by Dr. Corliss Skains   Genital herpes 2005   Has outbreaks monthly if not on preventative medication   GERD (gastroesophageal reflux disease) 2013   History of radiation therapy 02/07/17- 03/21/17   Right Breast- 4 field 25 fractions. 50 Gy to SCLV/PAB in 25 fractions. Right Breast Boost 10 gy in 5 fractions.   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 She will  itch and then blister at areas including her arms, back, and buttocks.    Urinary, incontinence, stress female 07/14/2016   patient reported   Past Surgical History:  Procedure Laterality Date   AXILLARY LYMPH NODE DISSECTION Right 12/26/2016   Procedure: RIGHT AXILLARY LYMPH NODE DISSECTION;  Surgeon: Glenna Fellows, MD;  Location: [facility];  Service: General;  Laterality: Right;   BREAST LUMPECTOMY Right 2018   BREAST LUMPECTOMY WITH NEEDLE LOCALIZATION Right 12/19/2016   Procedure: RIGHT BREAST NEEDLE LOCALIZED LUMPECTOMY, RIGHT RADIOACTIVE SEED TARGETED AXILLARY SENTINEL LYMPH NODE BIOPSY;  Surgeon: Glenna Fellows, MD;  Location: [facility];  Service: General;  Laterality: Right;   IR ANGIOGRAM SELECTIVE EACH ADDITIONAL VESSEL  08/27/2023   IR ANGIOGRAM VISCERAL SELECTIVE  08/27/2023   IR EMBO ARTERIAL NOT HEMORR HEMANG INC GUIDE ROADMAPPING  08/17/2023   IR EMBO TUMOR ORGAN ISCHEMIA INFARCT INC GUIDE ROADMAPPING  08/27/2023   IR EMBO TUMOR ORGAN ISCHEMIA INFARCT INC GUIDE ROADMAPPING  10/08/2023   IR GENERIC HISTORICAL  10/09/2016   IR CV LINE INJECTION 10/09/2016 Irish Lack, MD [facility]-INTERV RAD   IR IMAGING GUIDED PORT INSERTION  06/14/2022   IR RADIOLOGIST EVAL & MGMT  08/02/2023   IR US GUIDE VASC ACCESS RIGHT  08/27/2023   LAPAROSCOPIC APPENDECTOMY N/A  11/28/2018   Procedure: APPENDECTOMY LAPAROSCOPIC;  Surgeon: Berna Bue, MD;  Location: [facility] OR;  Service: General;  Laterality: N/A;   PORT-A-CATH REMOVAL Left 12/19/2016   Procedure: REMOVAL PORT-A-CATH;  Surgeon: Glenna Fellows, MD;  Location: [facility];  Service: General;  Laterality: Left;   PORTA CATH INSERTION  2021   PORTACATH PLACEMENT N/A 07/11/2016   Procedure: INSERTION PORT-A-CATH;  Surgeon: Glenna Fellows, MD;  Location: [facility];  Service: General;  Laterality: N/A;   RADIOACTIVE SEED GUIDED AXILLARY SENTINEL LYMPH NODE Right 12/19/2016   Procedure:  RADIOACTIVE SEED GUIDED AXILLARY SENTINEL LYMPH NODE BIOPSY;  Surgeon: Glenna Fellows, MD;  Location: [facility];  Service: General;  Laterality: Right;   WISDOM TOOTH EXTRACTION  yrs ago   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) 11/09/2021   Stomatitis 08/10/2020   Varicose veins of bilateral lower extremities with other complications 07/27/2019   Dry skin dermatitis 07/27/2019   Appendicitis 11/28/2018   De Quervain's disease (tenosynovitis) 05/15/2018   Bilateral carpal tunnel syndrome 05/15/2018   Neuropathy due to chemotherapeutic drug (HCC) 02/11/2018   Osteoporosis 03/23/2017   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) 06/21/2016   Malignant neoplasm of upper-outer quadrant of right breast in female, estrogen receptor positive (HCC) 06/21/2016   Elevated blood pressure reading without diagnosis of hypertension 04/28/2016   Herpes genitalis--since 2005 per patient    Environmental and seasonal allergies 07/28/2012   Fibromyalgia    Anxiety 02/07/2012   Depression 01/04/2012   Headache-migranes 01/04/2012    PCP: Julieanne Manson, MD  REFERRING PROVIDER: Lillard Anes, NP  REFERRING DIAG:   THERAPY DIAG:  Malignant neoplasm of upper-outer quadrant of right breast in female, estrogen receptor positive (HCC)  Malignant neoplasm of right breast, stage 4 (HCC)  Lymphedema, not elsewhere classified  ONSET DATE: 2018  Rationale for Evaluation and Treatment: Rehabilitation  SUBJECTIVE:                                                                                                                                                                                           SUBJECTIVE STATEMENT: I kept my sleeve on until Sunday but it didn't look any different when I took it off.  I  really feel bad today.  Like neuropathy and lightheaded.   PERTINENT HISTORY: treated here previously - has flexitouch. Rt breast cancer, R lumpectomy 01-04-17 with SLNB (3 were positive), then had ALNB (20 some were  postive), pt completed chemo and radiation, then pt had recurrence in May 2021 and is stage IV, it has spead to lymph nodes,liver, bone (L2, Rt ischium, L1), lung. Started new treatment with Doxil 01/21/24. I do have active cancer in the breast.    Imaging 11/26/23: Increased anasarca, particularly involving the right upper extremity and right chest wall. Trace ascites, without directly visible peritoneal metastatic Disease Pleural effusion   PAIN:  Are you having pain? Yes NPRS scale: 3/10 Pain location: Rt arm  Pain orientation: Right  PAIN TYPE: aching Pain description: constant  Aggravating factors: cleaning, using the arm  Relieving factors: maybe the gabapentin   PRECAUTIONS: Active cancer Rt breast, bone mets  RED FLAGS: None   WEIGHT BEARING RESTRICTIONS: No  FALLS:  Has patient fallen in last 6 months? No I am very unsteady standing on my own, like I have to lean on the wall when I shower.    LIVING ENVIRONMENT: Lives with: lives alone  OCCUPATION: retired  LEISURE: not reported   HAND DOMINANCE: right   PRIOR LEVEL OF FUNCTION: Independent with basic ADLs  PATIENT GOALS: reduce and manage swelling    OBJECTIVE: Note: Objective measures were completed at Evaluation unless otherwise noted.  COGNITION: Overall cognitive status: Within functional limits for tasks assessed   PALPATION: +1-2 pitting Rt hand to elbow  OBSERVATIONS / OTHER ASSESSMENTS: Rt breast with bumpiness/nodules that pt states they think is cancer starting to show through the breast.    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  POSTURE: WNL  UPPER EXTREMITY AROM/PROM:  A/PROM RIGHT   eval   Shoulder extension   Shoulder flexion   Shoulder abduction    Shoulder internal rotation   Shoulder external rotation     (Blank rows = not tested)  A/PROM LEFT   eval  Shoulder extension   Shoulder flexion   Shoulder abduction   Shoulder internal rotation   Shoulder external rotation     (Blank rows = not tested)  UPPER EXTREMITY STRENGTH:   LYMPHEDEMA ASSESSMENTS:   LANDMARK RIGHT  eval 01/28/24  At axilla  48 46  15 cm proximal to olecranon process 47.6   10 cm proximal to olecranon process 47   Olecranon process 37 - 42.5 38  15 cm proximal to ulnar styloid process 38.4   10 cm proximal to ulnar styloid process 34.8   Just proximal to ulnar styloid process 19.7 21  Across hand at thumb web space 21.4   At base of 2nd digit 7.1   (Blank rows = not tested)  LANDMARK LEFT  eval  At axilla  34.8  15 cm proximal to olecranon process 33.5  10 cm proximal to olecranon process 33.4  Olecranon process 33  15 cm proximal to ulnar styloid process 28.5  10 cm proximal to ulnar styloid process 25.4  Just proximal to ulnar styloid process 17.3  Across hand at thumb web space 20.5  At base of 2nd digit 6.7  (Blank rows = not tested)  10cm Volume on eval:  Rt arm    Lt arm  TREATMENT DATE:  01/28/24 Pt is feeling very bad today so we decided to order the velcro garment for home and skip MLD.  Pt also does not feel like the bandaging did anything over the weekend and prefers not to continue due to ease of using the arm - activities like cleaning being limited.  Velcro will be ready wrap size XL average and XL gauntlet as well as XL exosoft glove for use on fingers.   Gave pt text link to order on abilico but will send in to [facility] due to [employer] but pt will know what to order if needed.   Faxed request for prescription  01/25/24 In supine: Short neck, 5 diaphragmatic breaths, L axillary nodes  and establishment of interaxillary pathway, R inguinal nodes and establishment of axilloinguinal pathway, then R UE working proximal to distal, moving inner upper arm outwards and upwards, and doing both sides of forearms, spending extra time in any areas of fibrosis then retracing all steps.  Then applied lighter bandage for first time: tg soft medium, fingers 1-5, artiflex hand to axilla (cut hand foam but did not use due to shaping okay with cotton), then 6cm hand bandage, 10 cm forearm into upper arm with elbow X, and another 10cm mid forearm to axilla.  Education on removal, showering, bandage care, and not allowing any discomfort.   01/21/24 Eval performed Discussed POC options including bandaging, velcro reduction kit, and night garments as well as edema wear Gave handout on edema wear size Med Applied tg soft medium with large fold over gauntlet that pt had previously.   Discussed not sleeping in her current old sleeve    PATIENT EDUCATION:  Education details: per today's note Person educated: Patient Education method: Chief Technology Officer Education comprehension: verbalized understanding  HOME EXERCISE PROGRAM: May try flexitouch again  ASSESSMENT:  CLINICAL IMPRESSION: Bandages were tolerated well, but pt did not notice any size changes over the weekend after removing.  Due to the size of the arm and pitting edema in the hand and forearm, I would have expected her to notice some decrease in size if the fluid was able to exit the UE.  Discussed how the space may be occupied but could still shrink with chemo.  Pt prefers velcro and this will be ordered ASAP.  Some decrease maybe at the axilla with measurements but not the hand or forearm. Will discuss POC preferences next visit.      OBJECTIVE IMPAIRMENTS: decreased knowledge of condition, decreased knowledge of use of DME, decreased ROM, increased edema, and increased fascial restrictions.   ACTIVITY LIMITATIONS: carrying and  lifting  PARTICIPATION LIMITATIONS: community activity  PERSONAL FACTORS: Fitness, Past/current experiences, and 1-2 comorbidities: active cancer, space occupying lesion  are also affecting patient's functional outcome.   REHAB POTENTIAL: Good  CLINICAL DECISION MAKING: Evolving/moderate complexity  EVALUATION COMPLEXITY: Moderate  GOALS: Goals reviewed with patient? Yes  SHORT TERM GOALS=LTGs: Target date: 03/03/24  Pt will obtain velcro compression for long term management of her lymphedema  Baseline: Goal status: INITIAL  2.  Pt will return to use of her flexitouch with help from her daughter if able  Baseline:  Goal status: INITIAL   PLAN:  PT FREQUENCY: 3x/week  PT DURATION: 6 weeks  PLANNED INTERVENTIONS: 97164- PT Re-evaluation, 97110-Therapeutic exercises, 97530- Therapeutic activity, 97535- Self Care, 16109- Manual therapy, Patient/Family education, Therapeutic exercises, Therapeutic activity, Neuromuscular re-education, Gait training, and Self Care  PLAN FOR NEXT SESSION: Rt UE CDT - may start  lighter at first for tolerance Order velcro through [facility]over ASAP - double check sizing Prescription request sent over 01/28/24 order on PTs desk when back Sent over as urgent order 02/08/24 to [employer], PT 01/28/2024, 11:36 AM

## 2024-01-29 ENCOUNTER — Inpatient Hospital Stay: Payer: Medicaid Other

## 2024-01-29 ENCOUNTER — Inpatient Hospital Stay: Payer: Medicaid Other | Admitting: Hematology and Oncology

## 2024-01-30 ENCOUNTER — Telehealth: Payer: Self-pay | Admitting: Hematology and Oncology

## 2024-01-30 ENCOUNTER — Other Ambulatory Visit: Payer: Self-pay | Admitting: *Deleted

## 2024-01-30 ENCOUNTER — Encounter: Payer: Medicaid Other | Admitting: Rehabilitation

## 2024-01-30 DIAGNOSIS — C50919 Malignant neoplasm of unspecified site of unspecified female breast: Secondary | ICD-10-CM

## 2024-01-30 NOTE — Progress Notes (Signed)
 Received call from pt stating she has 5 nodules appear on her chest over the last 2 weeks.  Pt states nodules itch but there is no redness or rash.  Pt states nodules are hard and can be felt through the skin going from the right breast to the center of her chest.  RN reviewed with MD and verbal orders received to refer pt to [facility] for biopsy.  Referral placed and RN faxed to [phone] as urgent, first available provider.

## 2024-01-30 NOTE — Telephone Encounter (Signed)
 Scheduled appointments per WQ. Patient is aware of all made appointments.

## 2024-02-01 ENCOUNTER — Encounter: Payer: Medicaid Other | Admitting: Rehabilitation

## 2024-02-01 NOTE — Telephone Encounter (Signed)
 Offered appt.   Patient unavailable

## 2024-02-01 NOTE — Telephone Encounter (Signed)
 Called patient to offer appointment, patient did not take and stated that she has another appointment at same time.

## 2024-02-04 ENCOUNTER — Other Ambulatory Visit: Payer: Self-pay | Admitting: General Surgery

## 2024-02-04 ENCOUNTER — Encounter: Payer: Medicaid Other | Admitting: Rehabilitation

## 2024-02-04 DIAGNOSIS — C50812 Malignant neoplasm of overlapping sites of left female breast: Secondary | ICD-10-CM | POA: Diagnosis not present

## 2024-02-04 DIAGNOSIS — L988 Other specified disorders of the skin and subcutaneous tissue: Secondary | ICD-10-CM | POA: Diagnosis not present

## 2024-02-04 DIAGNOSIS — I890 Lymphedema, not elsewhere classified: Secondary | ICD-10-CM | POA: Diagnosis not present

## 2024-02-06 ENCOUNTER — Encounter: Payer: Medicaid Other | Admitting: Rehabilitation

## 2024-02-06 NOTE — Telephone Encounter (Signed)
Called patient to offer appointment, patient did not answer.

## 2024-02-07 LAB — DERMATOLOGY PATHOLOGY

## 2024-02-11 ENCOUNTER — Ambulatory Visit: Payer: Medicaid Other | Admitting: Rehabilitation

## 2024-02-11 NOTE — Telephone Encounter (Signed)
 Called patient to see if she is still with patient practice due to upcoming appt to est. Care with different pcp

## 2024-02-12 ENCOUNTER — Other Ambulatory Visit: Payer: Self-pay | Admitting: Internal Medicine

## 2024-02-13 ENCOUNTER — Ambulatory Visit: Payer: Medicaid Other | Attending: Adult Health | Admitting: Rehabilitation

## 2024-02-13 ENCOUNTER — Encounter: Payer: Self-pay | Admitting: Rehabilitation

## 2024-02-13 ENCOUNTER — Ambulatory Visit (INDEPENDENT_AMBULATORY_CARE_PROVIDER_SITE_OTHER): Payer: Medicaid Other | Admitting: Family Medicine

## 2024-02-13 ENCOUNTER — Encounter: Payer: Self-pay | Admitting: Family Medicine

## 2024-02-13 VITALS — BP 126/72 | HR 104 | Ht 63.0 in | Wt 202.2 lb

## 2024-02-13 DIAGNOSIS — Z170 Estrogen receptor positive status [ER+]: Secondary | ICD-10-CM | POA: Diagnosis present

## 2024-02-13 DIAGNOSIS — F419 Anxiety disorder, unspecified: Secondary | ICD-10-CM | POA: Diagnosis not present

## 2024-02-13 DIAGNOSIS — E669 Obesity, unspecified: Secondary | ICD-10-CM

## 2024-02-13 DIAGNOSIS — I890 Lymphedema, not elsewhere classified: Secondary | ICD-10-CM | POA: Diagnosis present

## 2024-02-13 DIAGNOSIS — F32A Depression, unspecified: Secondary | ICD-10-CM | POA: Diagnosis not present

## 2024-02-13 DIAGNOSIS — Z7689 Persons encountering health services in other specified circumstances: Secondary | ICD-10-CM | POA: Diagnosis not present

## 2024-02-13 DIAGNOSIS — Z6835 Body mass index (BMI) 35.0-35.9, adult: Secondary | ICD-10-CM | POA: Diagnosis not present

## 2024-02-13 DIAGNOSIS — C50411 Malignant neoplasm of upper-outer quadrant of right female breast: Secondary | ICD-10-CM | POA: Diagnosis present

## 2024-02-13 DIAGNOSIS — C50911 Malignant neoplasm of unspecified site of right female breast: Secondary | ICD-10-CM | POA: Insufficient documentation

## 2024-02-13 DIAGNOSIS — Z124 Encounter for screening for malignant neoplasm of cervix: Secondary | ICD-10-CM | POA: Diagnosis not present

## 2024-02-13 DIAGNOSIS — R5383 Other fatigue: Secondary | ICD-10-CM | POA: Diagnosis not present

## 2024-02-13 LAB — LIPID PANEL
Cholesterol: 197 mg/dL (ref 0–200)
HDL: 29.5 mg/dL — ABNORMAL LOW (ref >39.00)
LDL Cholesterol: 156 mg/dL — ABNORMAL HIGH (ref 0–99)
NonHDL: 167.92
Total CHOL/HDL Ratio: 7
Triglycerides: 61 mg/dL (ref 0.0–149.0)
VLDL: 12.2 mg/dL (ref 0.0–40.0)

## 2024-02-13 LAB — VITAMIN B12: Vitamin B-12: 654 pg/mL (ref 211–911)

## 2024-02-13 LAB — VITAMIN D 25 HYDROXY (VIT D DEFICIENCY, FRACTURES): VITD: 24.11 ng/mL — ABNORMAL LOW (ref 30.00–100.00)

## 2024-02-13 LAB — HEMOGLOBIN A1C: Hgb A1c MFr Bld: 4.8 % (ref 4.6–6.5)

## 2024-02-13 LAB — TSH: TSH: 2.64 u[IU]/mL (ref 0.35–5.50)

## 2024-02-13 MED ORDER — BUPROPION HCL ER (XL) 150 MG PO TB24: 150.0000 mg | ORAL_TABLET | Freq: Every day | ORAL | 0 refills | Status: DC

## 2024-02-13 NOTE — Therapy (Signed)
 OUTPATIENT PHYSICAL THERAPY  UPPER EXTREMITY ONCOLOGY   Patient Name: April [name] MRN: [id number] DOB:1960/12/17, 63 y.o., female Today's Date: 02/13/2024  END OF SESSION:  PT End of Session - 02/13/24 2113     Visit Number 4    Number of Visits 19    Date for PT Re-Evaluation 03/03/24    PT Start Time 1300    PT Stop Time 1345    PT Time Calculation (min) 45 min    Activity Tolerance Patient tolerated treatment well    Behavior During Therapy [facility] for tasks assessed/performed               Past Medical History:  Diagnosis Date   Allergy 07/28/2012   Seasonal/Environmental allergies   Anxiety 2013   Since 2013   Arthritis 2014 per patient    knees and shoulders   Bilateral ankle fractures 07/2015   Booted and resolved    Cancer [facility]) dx June 22, 2016   right breast   Depression 2013   Multiple  episodes  in past.   Elevated cholesterol 2017   Fibromyalgia 2013   diagnosed by Dr. Corliss Skains   Genital herpes 2005   Has outbreaks monthly if not on preventative medication   GERD (gastroesophageal reflux disease) 2013   History of radiation therapy 02/07/17- 03/21/17   Right Breast- 4 field 25 fractions. 50 Gy to SCLV/PAB in 25 fractions. Right Breast Boost 10 gy in 5 fractions.   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 [facility])    Metastatic adenocarcinoma to liver [facility]    Migraine 2013   migraines   Neuromuscular disorder (HCC) 03/20/2017   neuropathy in fingers and toes from Chemo--intermittent   Obesity    Osteoporosis 03/23/2017   noted per bone density scan   Peripheral neuropathy 08/13/2017   Personal history of chemotherapy 11/2016   Personal history of radiation therapy    4/18   Right wrist fracture 06/2015   Resolved   Scoliosis of thoracic spine 01/04/2012   Skin condition 2012   patient reports periodic episodes of severe itching. She will itch and then blister at areas including her arms, back,  and buttocks.    Urinary, incontinence, stress female 07/14/2016   patient reported   Past Surgical History:  Procedure Laterality Date   AXILLARY LYMPH NODE DISSECTION Right 12/26/2016   Procedure: RIGHT AXILLARY LYMPH NODE DISSECTION;  Surgeon: Glenna Fellows, MD;  Location: [facility];  Service: General;  Laterality: Right;   BREAST LUMPECTOMY Right 2018   BREAST LUMPECTOMY WITH NEEDLE LOCALIZATION Right 12/19/2016   Procedure: RIGHT BREAST NEEDLE LOCALIZED LUMPECTOMY, RIGHT RADIOACTIVE SEED TARGETED AXILLARY SENTINEL LYMPH NODE BIOPSY;  Surgeon: Glenna Fellows, MD;  Location: [facility];  Service: General;  Laterality: Right;   IR ANGIOGRAM SELECTIVE EACH ADDITIONAL VESSEL  08/27/2023   IR ANGIOGRAM VISCERAL SELECTIVE  08/27/2023   IR EMBO ARTERIAL NOT HEMORR HEMANG INC GUIDE ROADMAPPING  08/17/2023   IR EMBO TUMOR ORGAN ISCHEMIA INFARCT INC GUIDE ROADMAPPING  08/27/2023   IR EMBO TUMOR ORGAN ISCHEMIA INFARCT INC GUIDE ROADMAPPING  10/08/2023   IR GENERIC HISTORICAL  10/09/2016   IR CV LINE INJECTION 10/09/2016 Irish Lack, MD [facility]-INTERV RAD   IR IMAGING GUIDED PORT INSERTION  06/14/2022   IR RADIOLOGIST EVAL & MGMT  08/02/2023   IR US GUIDE VASC ACCESS RIGHT  08/27/2023   LAPAROSCOPIC APPENDECTOMY N/A 11/28/2018   Procedure: APPENDECTOMY  LAPAROSCOPIC;  Surgeon: Berna Bue, MD;  Location[facility];  Service: General;  Laterality: N/A;   PORT-A-CATH REMOVAL Left 12/19/2016   Procedure: REMOVAL PORT-A-CATH;  Surgeon: Glenna Fellows, MD;  Location: [facility];  Service: General;  Laterality: Left;   PORTA CATH INSERTION  2021   PORTACATH PLACEMENT N/A 07/11/2016   Procedure: INSERTION PORT-A-CATH;  Surgeon: Glenna Fellows, MD;  Location: [facility];  Service: General;  Laterality: N/A;   RADIOACTIVE SEED GUIDED AXILLARY SENTINEL LYMPH NODE Right 12/19/2016   Procedure: RADIOACTIVE SEED GUIDED AXILLARY SENTINEL LYMPH NODE BIOPSY;   Surgeon: Glenna Fellows, MD;  Location: [facility];  Service: General;  Laterality: Right;   WISDOM TOOTH EXTRACTION  yrs ago   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) 11/09/2021   Stomatitis 08/10/2020   Varicose veins of bilateral lower extremities with other complications 07/27/2019   Dry skin dermatitis 07/27/2019   Appendicitis 11/28/2018   De Quervain's disease (tenosynovitis) 05/15/2018   Bilateral carpal tunnel syndrome 05/15/2018   Neuropathy due to chemotherapeutic drug (HCC) 02/11/2018   Osteoporosis 03/23/2017   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) 06/21/2016   Malignant neoplasm of upper-outer quadrant of right breast in female, estrogen receptor positive (HCC) 06/21/2016   Elevated blood pressure reading without diagnosis of hypertension 04/28/2016   Herpes genitalis--since 2005 per patient    Environmental and seasonal allergies 07/28/2012   Fibromyalgia    Anxiety 02/07/2012   Depression 01/04/2012   Headache-migranes 01/04/2012    PCP: Julieanne Manson, MD  REFERRING PROVIDER: Lillard Anes, NP  REFERRING DIAG:   THERAPY DIAG:  Malignant neoplasm of upper-outer quadrant of right breast in female, estrogen receptor positive (HCC)  Malignant neoplasm of right breast, stage 4 (HCC)  Lymphedema, not elsewhere classified  ONSET DATE: 2018  Rationale for Evaluation and Treatment: Rehabilitation  SUBJECTIVE:                                                                                                                                                                                           SUBJECTIVE STATEMENT: I will see Dr. Pamelia Hoit on Monday to see what needs to change due to my new cancer.  I heard from the garment company and they said it should be  covered.    PERTINENT HISTORY: treated here previously - has flexitouch. Rt breast cancer, R lumpectomy 01-04-17 with SLNB (3 were positive), then had ALNB (20 some were postive),  pt completed chemo and radiation, then pt had recurrence in May 2021 and is stage IV, it has spead to lymph nodes,liver, bone (L2, Rt ischium, L1), lung. Started new treatment with Doxil 01/21/24. I do have active cancer in the breast.    Imaging 11/26/23: Increased anasarca, particularly involving the right upper extremity and right chest wall. Trace ascites, without directly visible peritoneal metastatic Disease Pleural effusion   PAIN:  Are you having pain? Yes NPRS scale: 3/10 Pain location: Rt arm  Pain orientation: Right  PAIN TYPE: aching Pain description: constant  Aggravating factors: cleaning, using the arm  Relieving factors: maybe the gabapentin   PRECAUTIONS: Active cancer Rt breast, bone mets  RED FLAGS: None   WEIGHT BEARING RESTRICTIONS: No  FALLS:  Has patient fallen in last 6 months? No I am very unsteady standing on my own, like I have to lean on the wall when I shower.    LIVING ENVIRONMENT: Lives with: lives alone  OCCUPATION: retired  LEISURE: not reported   HAND DOMINANCE: right   PRIOR LEVEL OF FUNCTION: Independent with basic ADLs  PATIENT GOALS: reduce and manage swelling    OBJECTIVE: Note: Objective measures were completed at Evaluation unless otherwise noted.  COGNITION: Overall cognitive status: Within functional limits for tasks assessed   PALPATION: +1-2 pitting Rt hand to elbow  OBSERVATIONS / OTHER ASSESSMENTS: Rt breast with bumpiness/nodules that pt states they think is cancer starting to show through the breast.    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  POSTURE: WNL  UPPER EXTREMITY AROM/PROM:  A/PROM RIGHT   eval   Shoulder extension   Shoulder flexion   Shoulder abduction   Shoulder internal rotation   Shoulder external  rotation     (Blank rows = not tested)  A/PROM LEFT   eval  Shoulder extension   Shoulder flexion   Shoulder abduction   Shoulder internal rotation   Shoulder external rotation     (Blank rows = not tested)  UPPER EXTREMITY STRENGTH:   LYMPHEDEMA ASSESSMENTS:   LANDMARK RIGHT  eval 01/28/24  At axilla  48 46  15 cm proximal to olecranon process 47.6   10 cm proximal to olecranon process 47   Olecranon process 37 - 42.5 38  15 cm proximal to ulnar styloid process 38.4   10 cm proximal to ulnar styloid process 34.8   Just proximal to ulnar styloid process 19.7 21  Across hand at thumb web space 21.4   At base of 2nd digit 7.1   (Blank rows = not tested)  LANDMARK LEFT  eval  At axilla  34.8  15 cm proximal to olecranon process 33.5  10 cm proximal to olecranon process 33.4  Olecranon process 33  15 cm proximal to ulnar styloid process 28.5  10 cm proximal to ulnar styloid process 25.4  Just proximal to ulnar styloid process 17.3  Across hand at thumb web space 20.5  At base of 2nd digit 6.7  (Blank rows = not tested)  10cm Volume on eval:  Rt arm    Lt arm  TREATMENT DATE:  02/13/24 Avoided active cancer region in the Rt breast and SCF region:  In supine: Short neck, 5 diaphragmatic breaths, L axillary nodes and establishment of interaxillary pathway, R inguinal nodes and establishment of axilloinguinal pathway, then R UE working proximal to distal, moving inner upper arm outwards and upwards, and doing both sides of forearms, spending extra time in any areas of fibrosis then retracing all steps.  Remeasured 3 places on the arm to make sure she fits in the same size garment which she does.   01/28/24 Pt is feeling very bad today so we decided to order the velcro garment for home and skip MLD.  Pt also does not feel like the bandaging did  anything over the weekend and prefers not to continue due to ease of using the arm - activities like cleaning being limited.  Velcro will be ready wrap size XL average and XL gauntlet as well as XL exosoft glove for use on fingers.   Gave pt text link to order on abilico but will send in to [facility] due to [employer] but pt will know what to order if needed.   Faxed request for prescription  01/25/24 In supine: Short neck, 5 diaphragmatic breaths, L axillary nodes and establishment of interaxillary pathway, R inguinal nodes and establishment of axilloinguinal pathway, then R UE working proximal to distal, moving inner upper arm outwards and upwards, and doing both sides of forearms, spending extra time in any areas of fibrosis then retracing all steps.  Then applied lighter bandage for first time: tg soft medium, fingers 1-5, artiflex hand to axilla (cut hand foam but did not use due to shaping okay with cotton), then 6cm hand bandage, 10 cm forearm into upper arm with elbow X, and another 10cm mid forearm to axilla.  Education on removal, showering, bandage care, and not allowing any discomfort.   01/21/24 Eval performed Discussed POC options including bandaging, velcro reduction kit, and night garments as well as edema wear Gave handout on edema wear size Med Applied tg soft medium with large fold over gauntlet that pt had previously.   Discussed not sleeping in her current old sleeve    PATIENT EDUCATION:  Education details: per today's note Person educated: Patient Education method: Chief Technology Officer Education comprehension: verbalized understanding  HOME EXERCISE PROGRAM: May try flexitouch again  ASSESSMENT:  CLINICAL IMPRESSION: Velcro has not yet been ordered but her benefits have been verified as covered 100%.  She is using her TG soft.  Reminded her she could just pay for it OOP if she would like it faster.  Pt got news that her punch biopsy of the Rt breast showed  cancer and she feels like she has more of these places popping up.  Will meet with team on Monday about plan.  Continued with MLD of the Rt UE to attempt any lymphatic movement.      OBJECTIVE IMPAIRMENTS: decreased knowledge of condition, decreased knowledge of use of DME, decreased ROM, increased edema, and increased fascial restrictions.   ACTIVITY LIMITATIONS: carrying and lifting  PARTICIPATION LIMITATIONS: community activity  PERSONAL FACTORS: Fitness, Past/current experiences, and 1-2 comorbidities: active cancer, space occupying lesion  are also affecting patient's functional outcome.   REHAB POTENTIAL: Good  CLINICAL DECISION MAKING: Evolving/moderate complexity  EVALUATION COMPLEXITY: Moderate  GOALS: Goals reviewed with patient? Yes  SHORT TERM GOALS=LTGs: Target date: 03/03/24  Pt will obtain velcro compression for long term management of her lymphedema  Baseline: Goal status:  INITIAL  2.  Pt will return to use of her flexitouch with help from her daughter if able  Baseline:  Goal status: INITIAL   PLAN:  PT FREQUENCY: 3x/week  PT DURATION: 6 weeks  PLANNED INTERVENTIONS: 97164- PT Re-evaluation, 97110-Therapeutic exercises, 97530- Therapeutic activity, 97535- Self Care, 16109- Manual therapy, Patient/Family education, Therapeutic exercises, Therapeutic activity, Neuromuscular re-education, Gait training, and Self Care  PLAN FOR NEXT SESSION: Rt UE CDT - may start lighter at first for tolerance Order velcro through [employer] ASAP - double check sizing Prescription request sent over 01/28/24 order on PTs desk when back Sent over as urgent order 02/08/24 to [employer], PT 02/13/2024, 9:13 PM

## 2024-02-13 NOTE — Patient Instructions (Addendum)
-  It was nice to meet you and look forward to taking care of you. -Ordered labs. Office will call with lab results. -Placed a referral to GYN. If you do not receive a phone call or MyChart message in 2 weeks, please call the office or send a MyChart message.  -Start Wellbutrin XR 150mg  tablet, take 1 tablet once a day in the morning.  -If you change your mind about counseling, please call the office or send a MyChart message.  -Please follow up with cardiology.  -Follow up in 1 month.
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
## 2024-02-14 ENCOUNTER — Telehealth: Payer: Self-pay | Admitting: *Deleted

## 2024-02-14 ENCOUNTER — Encounter: Payer: Self-pay | Admitting: Family Medicine

## 2024-02-14 NOTE — Telephone Encounter (Signed)
 Copied from CRM [id number]. Topic: General - Other >> Feb 14, 2024  2:04 PM Elizebeth Brooking wrote: Reason for CRM: Patient called in stating she had a missed call from office, would like for them to give her a callback

## 2024-02-15 ENCOUNTER — Ambulatory Visit: Payer: Medicaid Other | Admitting: Rehabilitation

## 2024-02-15 ENCOUNTER — Encounter: Payer: Self-pay | Admitting: Rehabilitation

## 2024-02-15 DIAGNOSIS — C50911 Malignant neoplasm of unspecified site of right female breast: Secondary | ICD-10-CM

## 2024-02-15 DIAGNOSIS — C50411 Malignant neoplasm of upper-outer quadrant of right female breast: Secondary | ICD-10-CM | POA: Diagnosis not present

## 2024-02-15 DIAGNOSIS — I890 Lymphedema, not elsewhere classified: Secondary | ICD-10-CM

## 2024-02-15 DIAGNOSIS — Z170 Estrogen receptor positive status [ER+]: Secondary | ICD-10-CM

## 2024-02-15 NOTE — Therapy (Signed)
 OUTPATIENT PHYSICAL THERAPY  UPPER EXTREMITY ONCOLOGY   Patient Name: April [name] MRN: [id number] DOB:06/30/1961, 63 y.o., female Today's Date: 02/15/2024  END OF SESSION:  PT End of Session - 02/15/24 1203     Visit Number 5    Number of Visits 19    Date for PT Re-Evaluation 03/03/24    PT Start Time 1105    PT Stop Time 1159    PT Time Calculation (min) 54 min    Activity Tolerance Patient tolerated treatment well    Behavior During Therapy [facility] for tasks assessed/performed                Past Medical History:  Diagnosis Date   Allergy 07/28/2012   Seasonal/Environmental allergies   Anxiety 2013   Since 2013   Arthritis 2014 per patient    knees and shoulders   Bilateral ankle fractures 07/2015   Booted and resolved    Cancer [facility]) dx June 22, 2016   right breast   Depression 2013   Multiple  episodes  in past.   Elevated cholesterol 2017   Fibromyalgia 2013   diagnosed by Dr. Corliss Skains   Genital herpes 2005   Has outbreaks monthly if not on preventative medication   GERD (gastroesophageal reflux disease) 2013   History of radiation therapy 02/07/17- 03/21/17   Right Breast- 4 field 25 fractions. 50 Gy to SCLV/PAB in 25 fractions. Right Breast Boost 10 gy in 5 fractions.   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 She will itch and then blister at areas including her arms,  back, and buttocks.    Urinary, incontinence, stress female 07/14/2016   patient reported   Past Surgical History:  Procedure Laterality Date   AXILLARY LYMPH NODE DISSECTION Right 12/26/2016   Procedure: RIGHT AXILLARY LYMPH NODE DISSECTION;  Surgeon: Glenna Fellows, MD;  Location: [facility];  Service: General;  Laterality: Right;   BREAST LUMPECTOMY Right 2018   BREAST LUMPECTOMY WITH NEEDLE LOCALIZATION Right 12/19/2016   Procedure: RIGHT BREAST NEEDLE LOCALIZED LUMPECTOMY, RIGHT RADIOACTIVE SEED TARGETED AXILLARY SENTINEL LYMPH NODE BIOPSY;  Surgeon: Glenna Fellows, MD;  Location: [facility];  Service: General;  Laterality: Right;   IR ANGIOGRAM SELECTIVE EACH ADDITIONAL VESSEL  08/27/2023   IR ANGIOGRAM VISCERAL SELECTIVE  08/27/2023   IR EMBO ARTERIAL NOT HEMORR HEMANG INC GUIDE ROADMAPPING  08/17/2023   IR EMBO TUMOR ORGAN ISCHEMIA INFARCT INC GUIDE ROADMAPPING  08/27/2023   IR EMBO TUMOR ORGAN ISCHEMIA INFARCT INC GUIDE ROADMAPPING  10/08/2023   IR GENERIC HISTORICAL  10/09/2016   IR CV LINE INJECTION 10/09/2016 Irish Lack, MD [facility]-INTERV RAD   IR IMAGING GUIDED PORT INSERTION  06/14/2022   IR RADIOLOGIST EVAL & MGMT  08/02/2023   IR US GUIDE VASC ACCESS RIGHT  08/27/2023   LAPAROSCOPIC APPENDECTOMY N/A 11/28/2018   Procedure:  APPENDECTOMY LAPAROSCOPIC;  Surgeon: Berna Bue, MD;  Location: [facility] OR;  Service: General;  Laterality: N/A;   PORT-A-CATH REMOVAL Left 12/19/2016   Procedure: REMOVAL PORT-A-CATH;  Surgeon: Glenna Fellows, MD;  Location: [facility];  Service: General;  Laterality: Left;   PORTA CATH INSERTION  2021   PORTACATH PLACEMENT N/A 07/11/2016   Procedure: INSERTION PORT-A-CATH;  Surgeon: Glenna Fellows, MD;  Location: [facility];  Service: General;  Laterality: N/A;   RADIOACTIVE SEED GUIDED AXILLARY SENTINEL LYMPH NODE Right 12/19/2016   Procedure: RADIOACTIVE SEED GUIDED AXILLARY SENTINEL LYMPH NODE BIOPSY;   Surgeon: Glenna Fellows, MD;  Location: [facility];  Service: General;  Laterality: Right;   WISDOM TOOTH EXTRACTION  yrs ago   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) 11/09/2021   Stomatitis 08/10/2020   Varicose veins of bilateral lower extremities with other complications 07/27/2019   Dry skin dermatitis 07/27/2019   Appendicitis 11/28/2018   De Quervain's disease (tenosynovitis) 05/15/2018   Bilateral carpal tunnel syndrome 05/15/2018   Neuropathy due to chemotherapeutic drug (HCC) 02/11/2018   Osteoporosis 03/23/2017   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) 06/21/2016   Malignant neoplasm of upper-outer quadrant of right breast in female, estrogen receptor positive (HCC) 06/21/2016   Elevated blood pressure reading without diagnosis of hypertension 04/28/2016   Herpes genitalis--since 2005 per patient    Environmental and seasonal allergies 07/28/2012   Fibromyalgia    Anxiety 02/07/2012   Depression 01/04/2012   Headache-migranes 01/04/2012    PCP: Julieanne Manson, MD  REFERRING PROVIDER: Lillard Anes, NP  REFERRING DIAG:   THERAPY DIAG:  Malignant neoplasm of upper-outer quadrant of right breast in female, estrogen receptor positive (HCC)  Malignant neoplasm of right breast, stage 4 (HCC)  Lymphedema, not elsewhere classified  ONSET DATE: 2018  Rationale for Evaluation and Treatment: Rehabilitation  SUBJECTIVE:                                                                                                                                                                                           SUBJECTIVE STATEMENT: Felt fine after last time.   PERTINENT HISTORY: treated here previously - has flexitouch. Rt breast cancer, R lumpectomy 01-04-17 with SLNB (3 were  positive), then had ALNB (20 some were postive), pt completed chemo and radiation, then pt had recurrence in May 2021 and is stage IV, it has spead to lymph nodes,liver, bone (L2, Rt ischium, L1),  lung. Started new treatment with Doxil 01/21/24. I do have active cancer in the breast.    Imaging 11/26/23: Increased anasarca, particularly involving the right upper extremity and right chest wall. Trace ascites, without directly visible peritoneal metastatic Disease Pleural effusion   PAIN:  Are you having pain? Yes NPRS scale: 3/10 Pain location: Rt arm  Pain orientation: Right  PAIN TYPE: aching Pain description: constant  Aggravating factors: cleaning, using the arm  Relieving factors: maybe the gabapentin   PRECAUTIONS: Active cancer Rt breast, bone mets  RED FLAGS: None   WEIGHT BEARING RESTRICTIONS: No  FALLS:  Has patient fallen in last 6 months? No I am very unsteady standing on my own, like I have to lean on the wall when I shower.    LIVING ENVIRONMENT: Lives with: lives alone  OCCUPATION: retired  LEISURE: not reported   HAND DOMINANCE: right   PRIOR LEVEL OF FUNCTION: Independent with basic ADLs  PATIENT GOALS: reduce and manage swelling    OBJECTIVE: Note: Objective measures were completed at Evaluation unless otherwise noted.  COGNITION: Overall cognitive status: Within functional limits for tasks assessed   PALPATION: +1-2 pitting Rt hand to elbow  OBSERVATIONS / OTHER ASSESSMENTS: Rt breast with bumpiness/nodules that pt states they think is cancer starting to show through the breast.    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  POSTURE: WNL  UPPER EXTREMITY AROM/PROM:  A/PROM RIGHT   eval   Shoulder extension   Shoulder flexion   Shoulder abduction   Shoulder internal rotation   Shoulder external rotation     (Blank rows = not tested)  A/PROM LEFT   eval  Shoulder extension   Shoulder flexion   Shoulder abduction    Shoulder internal rotation   Shoulder external rotation     (Blank rows = not tested)  UPPER EXTREMITY STRENGTH:   LYMPHEDEMA ASSESSMENTS:   LANDMARK RIGHT  eval 01/28/24  At axilla  48 46  15 cm proximal to olecranon process 47.6   10 cm proximal to olecranon process 47   Olecranon process 37 - 42.5 38  15 cm proximal to ulnar styloid process 38.4   10 cm proximal to ulnar styloid process 34.8   Just proximal to ulnar styloid process 19.7 21  Across hand at thumb web space 21.4   At base of 2nd digit 7.1   (Blank rows = not tested)  LANDMARK LEFT  eval  At axilla  34.8  15 cm proximal to olecranon process 33.5  10 cm proximal to olecranon process 33.4  Olecranon process 33  15 cm proximal to ulnar styloid process 28.5  10 cm proximal to ulnar styloid process 25.4  Just proximal to ulnar styloid process 17.3  Across hand at thumb web space 20.5  At base of 2nd digit 6.7  (Blank rows = not tested)  10cm Volume on eval:  Rt arm    Lt arm  TREATMENT DATE:  02/15/24 Avoided active cancer region in the Rt breast and SCF region:  In supine: Short neck, 5 diaphragmatic breaths, L axillary nodes and establishment of interaxillary pathway, R inguinal nodes and establishment of axilloinguinal pathway, then R UE working proximal to distal, moving inner upper arm outwards and upwards, and doing both sides of forearms, spending extra time in any areas of fibrosis then retracing all steps. Then posterior upper arm and interaxillary work in sidelying. Propped arm on pillow more today for comfort.  Sent note to [place] regarding signing the CMN for garments.   02/13/24 Avoided active cancer region in the Rt breast and SCF region:  In supine: Short neck, 5 diaphragmatic breaths, L axillary nodes and establishment of interaxillary pathway, R inguinal nodes  and establishment of axilloinguinal pathway, then R UE working proximal to distal, moving inner upper arm outwards and upwards, and doing both sides of forearms, spending extra time in any areas of fibrosis then retracing all steps.  Remeasured 3 places on the arm to make sure she fits in the same size garment which she does.   01/28/24 Pt is feeling very bad today so we decided to order the velcro garment for home and skip MLD.  Pt also does not feel like the bandaging did anything over the weekend and prefers not to continue due to ease of using the arm - activities like cleaning being limited.  Velcro will be ready wrap size XL average and XL gauntlet as well as XL exosoft glove for use on fingers.   Gave pt text link to order on abilico but will send in to [facility] due to [employer] but pt will know what to order if needed.   Faxed request for prescription  01/25/24 In supine: Short neck, 5 diaphragmatic breaths, L axillary nodes and establishment of interaxillary pathway, R inguinal nodes and establishment of axilloinguinal pathway, then R UE working proximal to distal, moving inner upper arm outwards and upwards, and doing both sides of forearms, spending extra time in any areas of fibrosis then retracing all steps.  Then applied lighter bandage for first time: tg soft medium, fingers 1-5, artiflex hand to axilla (cut hand foam but did not use due to shaping okay with cotton), then 6cm hand bandage, 10 cm forearm into upper arm with elbow X, and another 10cm mid forearm to axilla.  Education on removal, showering, bandage care, and not allowing any discomfort.   01/21/24 Eval performed Discussed POC options including bandaging, velcro reduction kit, and night garments as well as edema wear Gave handout on edema wear size Med Applied tg soft medium with large fold over gauntlet that pt had previously.   Discussed not sleeping in her current old sleeve    PATIENT EDUCATION:  Education  details: per today's note Person educated: Patient Education method: Chief Technology Officer Education comprehension: verbalized understanding  HOME EXERCISE PROGRAM: May try flexitouch again  ASSESSMENT:  CLINICAL IMPRESSION: Velcro has not yet been ordered but her benefits have been verified as covered 100%.  She is using her TG soft.  Continued with MLD of the Rt UE to attempt any lymphatic movement.      OBJECTIVE IMPAIRMENTS: decreased knowledge of condition, decreased knowledge of use of DME, decreased ROM, increased edema, and increased fascial restrictions.   ACTIVITY LIMITATIONS: carrying and lifting  PARTICIPATION LIMITATIONS: community activity  PERSONAL FACTORS: Fitness, Past/current experiences, and 1-2 comorbidities: active cancer, space occupying lesion  are also affecting patient's functional  outcome.   REHAB POTENTIAL: Good  CLINICAL DECISION MAKING: Evolving/moderate complexity  EVALUATION COMPLEXITY: Moderate  GOALS: Goals reviewed with patient? Yes  SHORT TERM GOALS=LTGs: Target date: 03/03/24  Pt will obtain velcro compression for long term management of her lymphedema  Baseline: Goal status: INITIAL  2.  Pt will return to use of her flexitouch with help from her daughter if able  Baseline:  Goal status: INITIAL   PLAN:  PT FREQUENCY: 3x/week  PT DURATION: 6 weeks  PLANNED INTERVENTIONS: 97164- PT Re-evaluation, 97110-Therapeutic exercises, 97530- Therapeutic activity, 97535- Self Care, 84696- Manual therapy, Patient/Family education, Therapeutic exercises, Therapeutic activity, Neuromuscular re-education, Gait training, and Self Care  PLAN FOR NEXT SESSION: Rt UE CDT - may start lighter at first for tolerance Order velcro through [employer] ASAP - double check sizing Prescription request sent over 01/28/24 order on PTs desk when back Sent over as urgent order 02/08/24 to [employer], PT 02/15/2024, 12:03 PM

## 2024-02-16 ENCOUNTER — Ambulatory Visit: Payer: Medicaid Other

## 2024-02-16 ENCOUNTER — Inpatient Hospital Stay: Payer: Medicaid Other | Attending: Oncology

## 2024-02-16 VITALS — BP 121/66 | HR 105 | Temp 97.3°F | Resp 16

## 2024-02-16 DIAGNOSIS — Z79899 Other long term (current) drug therapy: Secondary | ICD-10-CM | POA: Insufficient documentation

## 2024-02-16 DIAGNOSIS — Z79811 Long term (current) use of aromatase inhibitors: Secondary | ICD-10-CM | POA: Insufficient documentation

## 2024-02-16 DIAGNOSIS — C50411 Malignant neoplasm of upper-outer quadrant of right female breast: Secondary | ICD-10-CM | POA: Insufficient documentation

## 2024-02-16 DIAGNOSIS — C773 Secondary and unspecified malignant neoplasm of axilla and upper limb lymph nodes: Secondary | ICD-10-CM | POA: Insufficient documentation

## 2024-02-16 DIAGNOSIS — Z5111 Encounter for antineoplastic chemotherapy: Secondary | ICD-10-CM | POA: Insufficient documentation

## 2024-02-16 DIAGNOSIS — Z5189 Encounter for other specified aftercare: Secondary | ICD-10-CM | POA: Insufficient documentation

## 2024-02-16 DIAGNOSIS — Z170 Estrogen receptor positive status [ER+]: Secondary | ICD-10-CM | POA: Insufficient documentation

## 2024-02-16 DIAGNOSIS — C50919 Malignant neoplasm of unspecified site of unspecified female breast: Secondary | ICD-10-CM

## 2024-02-16 DIAGNOSIS — D649 Anemia, unspecified: Secondary | ICD-10-CM | POA: Diagnosis not present

## 2024-02-16 DIAGNOSIS — C50911 Malignant neoplasm of unspecified site of right female breast: Secondary | ICD-10-CM

## 2024-02-16 DIAGNOSIS — C7951 Secondary malignant neoplasm of bone: Secondary | ICD-10-CM | POA: Diagnosis not present

## 2024-02-16 MED ORDER — FILGRASTIM-SNDZ 300 MCG/0.5ML IJ SOSY
300.0000 ug | PREFILLED_SYRINGE | Freq: Once | INTRAMUSCULAR | Status: AC
Start: 2024-02-17 — End: 2024-02-16
  Administered 2024-02-16: 300 ug via SUBCUTANEOUS
  Filled 2024-02-16: qty 0.5

## 2024-02-16 NOTE — Progress Notes (Signed)
 Patient here for Zarxio 300 mcg injection.  Discussed with Brooke/RN and states ok to give

## 2024-02-18 ENCOUNTER — Inpatient Hospital Stay: Payer: Medicaid Other

## 2024-02-18 ENCOUNTER — Inpatient Hospital Stay (HOSPITAL_BASED_OUTPATIENT_CLINIC_OR_DEPARTMENT_OTHER): Payer: Medicaid Other | Admitting: Hematology and Oncology

## 2024-02-18 ENCOUNTER — Ambulatory Visit: Payer: Medicaid Other | Admitting: Rehabilitation

## 2024-02-18 ENCOUNTER — Telehealth: Payer: Self-pay

## 2024-02-18 ENCOUNTER — Encounter: Payer: Self-pay | Admitting: Hematology and Oncology

## 2024-02-18 VITALS — BP 105/67 | HR 93 | Temp 98.2°F | Resp 17 | Ht 63.0 in | Wt 208.4 lb

## 2024-02-18 DIAGNOSIS — C50911 Malignant neoplasm of unspecified site of right female breast: Secondary | ICD-10-CM

## 2024-02-18 DIAGNOSIS — Z5111 Encounter for antineoplastic chemotherapy: Secondary | ICD-10-CM | POA: Diagnosis not present

## 2024-02-18 DIAGNOSIS — C50919 Malignant neoplasm of unspecified site of unspecified female breast: Secondary | ICD-10-CM

## 2024-02-18 DIAGNOSIS — C773 Secondary and unspecified malignant neoplasm of axilla and upper limb lymph nodes: Secondary | ICD-10-CM | POA: Diagnosis not present

## 2024-02-18 DIAGNOSIS — C50411 Malignant neoplasm of upper-outer quadrant of right female breast: Secondary | ICD-10-CM

## 2024-02-18 LAB — CBC WITH DIFFERENTIAL (CANCER CENTER ONLY)
Abs Immature Granulocytes: 0.03 10*3/uL (ref 0.00–0.07)
Basophils Absolute: 0 10*3/uL (ref 0.0–0.1)
Basophils Relative: 0 %
Eosinophils Absolute: 0 10*3/uL (ref 0.0–0.5)
Eosinophils Relative: 0 %
HCT: 30.5 % — ABNORMAL LOW (ref 36.0–46.0)
Hemoglobin: 10.3 g/dL — ABNORMAL LOW (ref 12.0–15.0)
Immature Granulocytes: 1 %
Lymphocytes Relative: 6 %
Lymphs Abs: 0.3 10*3/uL — ABNORMAL LOW (ref 0.7–4.0)
MCH: 36.1 pg — ABNORMAL HIGH (ref 26.0–34.0)
MCHC: 33.8 g/dL (ref 30.0–36.0)
MCV: 107 fL — ABNORMAL HIGH (ref 80.0–100.0)
Monocytes Absolute: 0.4 10*3/uL (ref 0.1–1.0)
Monocytes Relative: 8 %
Neutro Abs: 4.6 10*3/uL (ref 1.7–7.7)
Neutrophils Relative %: 85 %
Platelet Count: 112 10*3/uL — ABNORMAL LOW (ref 150–400)
RBC: 2.85 MIL/uL — ABNORMAL LOW (ref 3.87–5.11)
RDW: 18.6 % — ABNORMAL HIGH (ref 11.5–15.5)
WBC Count: 5.4 10*3/uL (ref 4.0–10.5)
nRBC: 0 % (ref 0.0–0.2)

## 2024-02-18 LAB — CMP (CANCER CENTER ONLY)
ALT: 25 U/L (ref 0–44)
AST: 51 U/L — ABNORMAL HIGH (ref 15–41)
Albumin: 2.7 g/dL — ABNORMAL LOW (ref 3.5–5.0)
Alkaline Phosphatase: 425 U/L — ABNORMAL HIGH (ref 38–126)
Anion gap: 3 — ABNORMAL LOW (ref 5–15)
BUN: 9 mg/dL (ref 8–23)
CO2: 25 mmol/L (ref 22–32)
Calcium: 8.2 mg/dL — ABNORMAL LOW (ref 8.9–10.3)
Chloride: 109 mmol/L (ref 98–111)
Creatinine: 0.49 mg/dL (ref 0.44–1.00)
GFR, Estimated: >60 mL/min (ref >60)
Glucose, Bld: 114 mg/dL — ABNORMAL HIGH (ref 70–99)
Potassium: 3.6 mmol/L (ref 3.5–5.1)
Sodium: 137 mmol/L (ref 135–145)
Total Bilirubin: 1.2 mg/dL (ref 0.0–1.2)
Total Protein: 5.8 g/dL — ABNORMAL LOW (ref 6.5–8.1)

## 2024-02-18 MED ORDER — SODIUM CHLORIDE 0.9% FLUSH
10.0000 mL | INTRAVENOUS | Status: DC | PRN
  Administered 2024-02-18: 10 mL

## 2024-02-18 MED ORDER — DENOSUMAB 120 MG/1.7ML ~~LOC~~ SOLN
120.0000 mg | Freq: Once | SUBCUTANEOUS | Status: AC
  Administered 2024-02-18: 120 mg via SUBCUTANEOUS
  Filled 2024-02-18: qty 1.7

## 2024-02-18 MED ORDER — HEPARIN SOD (PORK) LOCK FLUSH 100 UNIT/ML IV SOLN
500.0000 [IU] | Freq: Once | INTRAVENOUS | Status: AC | PRN
  Administered 2024-02-18: 500 [IU]

## 2024-02-18 MED ORDER — DEXAMETHASONE SODIUM PHOSPHATE 10 MG/ML IJ SOLN
10.0000 mg | Freq: Once | INTRAMUSCULAR | Status: AC
  Administered 2024-02-18: 10 mg via INTRAVENOUS
  Filled 2024-02-18: qty 1

## 2024-02-18 MED ORDER — DEXTROSE 5 % IV SOLN
45.0000 mg/m2 | Freq: Once | INTRAVENOUS | Status: AC
  Administered 2024-02-18: 100 mg via INTRAVENOUS
  Filled 2024-02-18: qty 50

## 2024-02-18 MED ORDER — SODIUM CHLORIDE 0.9% FLUSH
10.0000 mL | Freq: Once | INTRAVENOUS | Status: AC
  Administered 2024-02-18: 10 mL

## 2024-02-18 MED ORDER — DEXTROSE 5 % IV SOLN
INTRAVENOUS | Status: DC
Start: 2024-02-18 — End: 2024-02-18

## 2024-02-18 NOTE — Progress Notes (Signed)
 Patient Care Team: Alveria Apley, NP as PCP - General (Family Medicine) Maisie Fus, MD as PCP - Cardiology (Cardiology) Lonie Peak, MD as Attending Physician (Radiation Oncology) Axel Filler, Larna Daughters, NP as Nurse Practitioner (Hematology and Oncology) Berna Bue, MD as Consulting Physician (General Surgery) Felecia Shelling, DPM as Consulting Physician (Podiatry) Graylin Shiver, MD as Consulting Physician (Gastroenterology) Serena Croissant, MD as Attending Physician (Hematology and Oncology)  DIAGNOSIS:  Encounter Diagnosis  Name Primary?   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) Yes    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)   06/21/2022 - 08/03/2022 Chemotherapy   Patient is on Treatment Plan : BREAST METASTATIC fam-trastuzumab deruxtecan-nxki (Enhertu) q21d     06/21/2022 - 04/19/2023 Chemotherapy   Patient is on Treatment Plan : BREAST METASTATIC Fam-Trastuzumab Deruxtecan-nxki (Enhertu) (5.4) q21d     05/18/2023 - 07/06/2023 Chemotherapy   Patient is on Treatment Plan : BREAST METASTATIC Sacituzumab govitecan-hziy [name]) D1,8 q21d     10/16/2023 - 01/05/2024 Chemotherapy   Patient is on Treatment Plan : BREAST METASTATIC Eribulin D1,8 q21d     01/21/2024 -  Chemotherapy   Patient is on Treatment Plan : BREAST Liposomal Doxorubicin (50) q28d     Malignant neoplasm of upper-outer quadrant of right breast in female, estrogen receptor positive (HCC)  06/21/2016 Initial Diagnosis   Malignant neoplasm of upper-outer quadrant of right breast in female, estrogen receptor positive (HCC)   06/21/2016 Initial Biopsy   Right breast biopsy, 10 oclock: IDC, grade 3, ER+(95%), PR+(80%),Ki67 20%, HER-2 negative (ratio 0.67). Right axilla core biopsy: carcinoma, grade 3, ER+(100%), PR+(90%),  Ki67 25%, HER-2 negative (ratio 1.13).    07/17/2016 - 11/06/2016 Neo-Adjuvant Chemotherapy   Received 2 cycles of Doxorubicin and Cyclophosphamide, then transitioned to weekly Paclitaxel (due to repeated febrile neutropenia) x 7 cycles, stopped early due to peripheral neuropathy, then completed her final 2 cycles of Doxorubicin and Cyclophosphamide.    12/19/2016 Surgery   Right breast lumpectomy ([facility]): IDC, grade 2, 1.6cm and 0.3cm, margins negative, 3 SLN positive for metastatic carcinoma.     12/26/2016 Surgery   ALND: metastatic carcinoma in one of 20 lymph nodes, and three nodes from previous lumpectomy.  Four positive nodes, consistent with pN2a.   02/07/2017 - 03/21/2017 Radiation Therapy   Adjuvant radiation Basilio [place]): 1) Right breast and nodes - 4 field: 50 Gy in 25 fractions. IM NODES: >95% receive at least 45Gy/47fx. 50Gy to SCLV/PAB @ 2Gy /fraction x 25 fractions. 2) Right breast boost: 10 Gy in 5 fractions   04/2017 -  Anti-estrogen oral therapy   Anastrozole 1mg  daily.  Bone density 03/23/2017 finds T score of -2.6, osteoporosis, plan to start Prolia following dental clearance Anastrozole stopped 01/16/18 Exemestane 25 mg daily 02/12/18  On [name], trial randomized to endocrine therapy alone   05/27/2020 Progression   chest CT scan 05/27/2020 shows bulky mediastinal and right hilar lymphadenopathy with right pleural nodules and a small right pleural effusion, no evidence of liver or bone involvement             (a) biopsy of right breast mass 06/02/2020 shows invasive ductal carcinoma, estrogen and progesterone receptor positive, HER-2 not amplified, with an MIB-1 of 40%   06/17/2020 Treatment Plan Change    fulvestrant to start 06/17/2020             (  a) palbociclib to start 06/17/2020 at 125 mg daily 21 days on 7 days off             (b) palbociclib decreased to 125mg  every other day x 11 doses on 07/23/2020             (c) palbociclib dose decreased to100 mg daily, 21/7, starting  with September cycle             (d) Palbociclib decreased to 75mg  daily beginning with March cycle due to oral ulcers             (e) palbociclib discontinued November 2022 with progression   09/14/2021 PET scan   PET scan 09/14/2021 shows progression in liver and bone   10/05/2021 Pathology Results   liver biopsy 10/05/2021 confirms metastatic carcinoma, estrogen receptor strongly positive, progesterone receptor and HER2 negative, with an MIB-1 of 30%.   10/2021 Treatment Plan Change   fulvestrant continued, Xgeva added, palbociclib changed to abemaciclib November 2022 -Dose Decreased to 50mg  PO BID Daily   03/07/2022 Miscellaneous   Caris molecular testing: ER positive, ER positive, MTAP: Detected, ESR 1 negative, BRCA 1 and 2 negative, MSI stable, PD-L1 negative, PIK 3 CA negative, PR negative,   06/21/2022 - 08/03/2022 Chemotherapy   Patient is on Treatment Plan : BREAST METASTATIC fam-trastuzumab deruxtecan-nxki (Enhertu) q21d     06/21/2022 - 04/19/2023 Chemotherapy   Patient is on Treatment Plan : BREAST METASTATIC Fam-Trastuzumab Deruxtecan-nxki (Enhertu) (5.4) q21d     01/25/2023 Cancer Staging   Staging form: Breast, AJCC 7th Edition - Pathologic: Stage IV (M1) - Signed by Loa Socks, NP on 01/25/2023   05/18/2023 - 07/06/2023 Chemotherapy   Patient is on Treatment Plan : BREAST METASTATIC Sacituzumab govitecan-hziy [name]) D1,8 q21d     07/23/2023 -  Chemotherapy   Xeloda 1000 mg bid 14 days on and 7 days off   08/27/2023 Procedure   Y90 hepatic radio embolization   10/16/2023 - 01/05/2024 Chemotherapy   Patient is on Treatment Plan : BREAST METASTATIC Eribulin D1,8 q21d     01/21/2024 -  Chemotherapy   Patient is on Treatment Plan : BREAST Liposomal Doxorubicin (50) q28d     Breast cancer, stage 4 (HCC)  03/09/2022 Initial Diagnosis   Breast cancer, stage 4 (HCC)   06/21/2022 - 08/03/2022 Chemotherapy   Patient is on Treatment Plan : BREAST METASTATIC  fam-trastuzumab deruxtecan-nxki (Enhertu) q21d     06/21/2022 - 04/19/2023 Chemotherapy   Patient is on Treatment Plan : BREAST METASTATIC Fam-Trastuzumab Deruxtecan-nxki (Enhertu) (5.4) q21d     05/18/2023 - 07/06/2023 Chemotherapy   Patient is on Treatment Plan : BREAST METASTATIC Sacituzumab govitecan-hziy [name]) D1,8 q21d     10/16/2023 - 01/05/2024 Chemotherapy   Patient is on Treatment Plan : BREAST METASTATIC Eribulin D1,8 q21d     01/21/2024 -  Chemotherapy   Patient is on Treatment Plan : BREAST Liposomal Doxorubicin (50) q28d       CHIEF COMPLIANT: Cycle 2 Doxil  HISTORY OF PRESENT ILLNESS:   History of Present Illness April [name], a patient undergoing chemotherapy, reports no side effects from the treatment. She recently had biopsies done, which came back positive for neoplastic cells. She mentions that the biopsies are "all over the place" and that she can feel them under her skin. She also notes that her breast feels hard and looks weird. Despite these symptoms, she reports no discomfort from the chemotherapy.  April [name] is due for another round of chemotherapy, and  the doctor is considering whether to increase the dosage. She has not experienced any adverse effects from the injection.  April [name]'s blood work shows that her white count is improving with the shot, but her red count has dipped, potentially causing fatigue. Her platelets are above 100, which is considered fine.     ALLERGIES:  is allergic to eribulin mesylate, codeine, cymbalta [duloxetine hcl], hydrocodone, ultram [tramadol hcl], and venlafaxine.  MEDICATIONS:  Current Outpatient Medications  Medication Sig Dispense Refill   buPROPion (WELLBUTRIN XL) 150 MG 24 hr tablet Take 1 tablet (150 mg total) by mouth daily. 90 tablet 0   carvedilol (COREG) 3.125 MG tablet TAKE 1 TABLET BY MOUTH TWICE A DAY 180 tablet 1   Cetirizine HCl (ZYRTEC ALLERGY PO) Take 10 mg by mouth daily.     cyclobenzaprine (FLEXERIL) 5 MG tablet  Take 1 tablet (5 mg total) by mouth 3 (three) times daily as needed for muscle spasms. 30 tablet 0   Denosumab (XGEVA [place]) Inject 120 mg into the skin every 3 (three) months.     DOXORUBICIN HCL IV Inject into the vein.     furosemide (LASIX) 40 MG tablet Take 1 tablet (40 mg total) by mouth 2 (two) times daily. 60 tablet 6   gabapentin (NEURONTIN) 100 MG capsule Take 1 capsule (100 mg total) by mouth 3 (three) times daily for 3 days, THEN 2 capsules (200 mg total) 3 (three) times daily for 3 days, THEN 3 capsules (300 mg total) 3 (three) times daily. 297 capsule 0   KLOR-CON M10 10 MEQ tablet TAKE 1 TABLET BY MOUTH EVERY DAY 90 tablet 1   lidocaine-prilocaine (EMLA) cream Apply to affected area once 30 g 3   naproxen sodium (ALEVE) 220 MG tablet Take 440-660 mg by mouth 2 (two) times daily as needed (pain).     omeprazole (PRILOSEC) 20 MG capsule Take 20 mg by mouth daily.     ondansetron (ZOFRAN) 8 MG tablet Take 1 tablet (8 mg total) by mouth every 8 (eight) hours as needed for nausea or vomiting. 30 tablet 1   prochlorperazine (COMPAZINE) 10 MG tablet Take 1 tablet (10 mg total) by mouth every 6 (six) hours as needed for nausea or vomiting. 30 tablet 1   rizatriptan (MAXALT) 5 MG tablet Take 1 tablet (5 mg total) by mouth as needed for migraine. May repeat in 2 hours if needed 10 tablet 0   valACYclovir (VALTREX) 1000 MG tablet Take 1 tablet (1,000 mg total) by mouth 2 (two) times daily. 1/2 tab BID (Patient taking differently: Take 500 mg by mouth See admin instructions. Take 500 mg daily, may take a second 500 mg dose as needed for outbreaks) 180 tablet 4   No current facility-administered medications for this visit.    PHYSICAL EXAMINATION: ECOG PERFORMANCE STATUS: 1 - Symptomatic but completely ambulatory  Vitals:   02/18/24 1000  BP: 105/67  Pulse: 93  Resp: 17  Temp: 98.2 F (36.8 C)  SpO2: 97%   Filed Weights   02/18/24 1000  Weight: 208 lb 6.4 oz (94.5 kg)   Physical  examination: Breast: Palpable nodularity in the right breast in the subcutaneous tissues  LABORATORY DATA:  I have reviewed the data as listed    Latest Ref Rng & Units 02/18/2024    9:27 AM 01/21/2024    8:20 AM 01/08/2024   12:33 PM  CMP  Glucose 70 - 99 mg/dL 696  85  295   BUN 8 -  23 mg/dL 9  9  10    Creatinine 0.44 - 1.00 mg/dL 4.09  8.11  9.14   Sodium 135 - 145 mmol/L 137  139  140   Potassium 3.5 - 5.1 mmol/L 3.6  3.5  3.0   Chloride 98 - 111 mmol/L 109  107  111   CO2 22 - 32 mmol/L 25  27  25    Calcium 8.9 - 10.3 mg/dL 8.2  8.2  8.1   Total Protein 6.5 - 8.1 g/dL 5.8  5.6  5.4   Total Bilirubin 0.0 - 1.2 mg/dL 1.2  2.1  2.1   Alkaline Phos 38 - 126 U/L 425  254  262   AST 15 - 41 U/L 51  42  46   ALT 0 - 44 U/L 25  19  19      Lab Results  Component Value Date   WBC 5.4 02/18/2024   HGB 10.3 (L) 02/18/2024   HCT 30.5 (L) 02/18/2024   MCV 107.0 (H) 02/18/2024   PLT 112 (L) 02/18/2024   NEUTROABS 4.6 02/18/2024    ASSESSMENT & PLAN:  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) 1.  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 bone metastasis: Currently on Xgeva every 3 months.   03/28/2023: CA 27-29: 84.4 04/19/2023: CA 27-29: 126 06/22/2023: CA 27-29: 204 09/13/2023: CA 27-29: 329 11/06/2023: CA 27-29: 59   Caris molecular testing: ER positive, ER positive, TMB 5, PD-L1 negative, PIK 3 CA negative  -------------------------------------------------------- Current treatment: Doxil every 4 weeks, today cycle 2 Echocardiogram 01/11/2024: EF 60 to 65%   Chemo toxicities: Cytopenias:Okay to treat with ANC of 1500 and platelets of 92 We will add Granix injection on Saturdays before each treatment. Edema: Experiencing swelling, currently on Lasix.  New chest wall nodules: Biopsy on 02/04/2024.  We will obtain the ER/PR HER2  testing as well as Caris molecular testing on the tissue.  Return to clinic every 4 weeks  ------------------------------------- Assessment and Plan Assessment & Plan Breast cancer with skin involvement Metastasis to skin confirmed by biopsy. Chemotherapy penetration challenged by skin's defense. Initial low-dose chemotherapy completed with uncertain efficacy. Plan to increase dosage for better response. Additional tests planned for treatment planning. - Increase chemotherapy dosage to 45. - Monitor breast texture changes for treatment response. - Perform ERP, HER2, and Kairis testing. - Administer Xgeva and chemotherapy infusion today.  Bone metastasis Receiving Denosumab Rivka Barbara) every three months. Next dose due today. - Administer Xgeva today with chemotherapy infusion.  Anemia Hemoglobin decreased to 10.3, indicating anemia. Platelets and neutrophils stable. Blood transfusion planned to improve hemoglobin and energy. - Plan blood transfusion tomorrow.    Follow-up Close monitoring required to assess treatment efficacy and manage side effects. Additional test results will guide future treatment. - Schedule follow-up after next treatment to review Caris testing results and discuss treatment adjustments.      No orders of the defined types were placed in this encounter.  The patient has a good understanding of the overall plan. she agrees with it. she will call with any problems that may develop before the next visit here. Total time spent: 30 mins including face to face time and time spent for planning, charting and co-ordination of care   Tamsen Meek, MD 02/18/24

## 2024-02-18 NOTE — Patient Instructions (Signed)
 [facility] - A DEPT OF [facility]  Discharge Instructions: Thank you for choosing [facility] to provide your oncology and hematology care.   If you have a lab appointment with the Cancer Center, please go directly to the Cancer Center and check in at the registration area.   Wear comfortable clothing and clothing appropriate for easy access to any Portacath or PICC line.   We strive to give you quality time with your provider. You may need to reschedule your appointment if you arrive late (15 or more minutes).  Arriving late affects you and other patients whose appointments are after yours.  Also, if you miss three or more appointments without notifying the office, you may be dismissed from the clinic at the provider's discretion.      For prescription refill requests, have your pharmacy contact our office and allow 72 hours for refills to be completed.    Today you received the following chemotherapy and/or immunotherapy agents: Doxil      To help prevent nausea and vomiting after your treatment, we encourage you to take your nausea medication as directed.  BELOW ARE SYMPTOMS THAT SHOULD BE REPORTED IMMEDIATELY: *FEVER GREATER THAN 100.4 F (38 C) OR HIGHER *CHILLS OR SWEATING *NAUSEA AND VOMITING THAT IS NOT CONTROLLED WITH YOUR NAUSEA MEDICATION *UNUSUAL SHORTNESS OF BREATH *UNUSUAL BRUISING OR BLEEDING *URINARY PROBLEMS (pain or burning when urinating, or frequent urination) *BOWEL PROBLEMS (unusual diarrhea, constipation, pain near the anus) TENDERNESS IN MOUTH AND THROAT WITH OR WITHOUT PRESENCE OF ULCERS (sore throat, sores in mouth, or a toothache) UNUSUAL RASH, SWELLING OR PAIN  UNUSUAL VAGINAL DISCHARGE OR ITCHING   Items with * indicate a potential emergency and should be followed up as soon as possible or go to the Emergency Department if any problems should occur.  Please show the CHEMOTHERAPY ALERT CARD or IMMUNOTHERAPY  ALERT CARD at check-in to the Emergency Department and triage nurse.  Should you have questions after your visit or need to cancel or reschedule your appointment, please contact [facility] - A DEPT OF [facility]  Dept: [phone]  and follow the prompts.  Office hours are 8:00 a.m. to 4:30 p.m. Monday - Friday. Please note that voicemails left after 4:00 p.m. may not be returned until the following business day.  We are closed weekends and major holidays. You have access to a nurse at all times for urgent questions. Please call the main number to the clinic Dept: [phone] and follow the prompts.   For any non-urgent questions, you may also contact your provider using MyChart. We now offer e-Visits for anyone 12 and older to request care online for non-urgent symptoms. For details visit [email or web address].   Also download the MyChart app! Go to the app store, search "MyChart", open the app, select [place], and log in with your MyChart username and password.  Denosumab Injection (Oncology) What is this medication? DENOSUMAB (den oh SUE mab) prevents weakened bones caused by cancer. It may also be used to treat noncancerous bone tumors that cannot be removed by surgery. It can also be used to treat high calcium levels in the blood caused by cancer. It works by blocking a protein that causes bones to break down quickly. This slows down the release of calcium from bones, which lowers calcium levels in your blood. It also makes your bones stronger and less likely to break (fracture). This  medicine may be used for other purposes; ask your health care provider or pharmacist if you have questions. COMMON BRAND NAME(S): XGEVA What should I tell my care team before I take this medication? They need to know if you have any of these conditions: Dental disease Having surgery or tooth extraction Infection Kidney disease Low levels of calcium or vitamin D in the  blood Malnutrition On hemodialysis Skin conditions or sensitivity Thyroid or parathyroid disease An unusual reaction to denosumab, other medications, foods, dyes, or preservatives Pregnant or trying to get pregnant Breast-feeding How should I use this medication? This medication is for injection under the skin. It is given by your care team in a hospital or clinic setting. A special MedGuide will be given to you before each treatment. Be sure to read this information carefully each time. Talk to your care team about the use of this medication in children. While it may be prescribed for children as young as 13 years for selected conditions, precautions do apply. Overdosage: If you think you have taken too much of this medicine contact a poison control center or emergency room at once. NOTE: This medicine is only for you. Do not share this medicine with others. What if I miss a dose? Keep appointments for follow-up doses. It is important not to miss your dose. Call your care team if you are unable to keep an appointment. What may interact with this medication? Do not take this medication with any of the following: Other medications containing denosumab This medication may also interact with the following: Medications that lower your chance of fighting infection Steroid medications, such as prednisone or cortisone This list may not describe all possible interactions. Give your health care provider a list of all the medicines, herbs, non-prescription drugs, or dietary supplements you use. Also tell them if you smoke, drink alcohol, or use illegal drugs. Some items may interact with your medicine. What should I watch for while using this medication? Your condition will be monitored carefully while you are receiving this medication. You may need blood work while taking this medication. This medication may increase your risk of getting an infection. Call your care team for advice if you get a  fever, chills, sore throat, or other symptoms of a cold or flu. Do not treat yourself. Try to avoid being around people who are sick. You should make sure you get enough calcium and vitamin D while you are taking this medication, unless your care team tells you not to. Discuss the foods you eat and the vitamins you take with your care team. Some people who take this medication have severe bone, joint, or muscle pain. This medication may also increase your risk for jaw problems or a broken thigh bone. Tell your care team right away if you have severe pain in your jaw, bones, joints, or muscles. Tell your care team if you have any pain that does not go away or that gets worse. Talk to your care team if you may be pregnant. Serious birth defects can occur if you take this medication during pregnancy and for 5 months after the last dose. You will need a negative pregnancy test before starting this medication. Contraception is recommended while taking this medication and for 5 months after the last dose. Your care team can help you find the option that works for you. What side effects may I notice from receiving this medication? Side effects that you should report to your care team as soon as  possible: Allergic reactions--skin rash, itching, hives, swelling of the face, lips, tongue, or throat Bone, joint, or muscle pain Low calcium level--muscle pain or cramps, confusion, tingling, or numbness in the hands or feet Osteonecrosis of the jaw--pain, swelling, or redness in the mouth, numbness of the jaw, poor healing after dental work, unusual discharge from the mouth, visible bones in the mouth Side effects that usually do not require medical attention (report to your care team if they continue or are bothersome): Cough Diarrhea Fatigue Headache Nausea This list may not describe all possible side effects. Call your doctor for medical advice about side effects. You may report side effects to FDA at  1-800-FDA-1088. Where should I keep my medication? This medication is given in a hospital or clinic. It will not be stored at home. NOTE: This sheet is a summary. It may not cover all possible information. If you have questions about this medicine, talk to your doctor, pharmacist, or health care provider.  2024 Elsevier/Gold Standard (2022-04-12 00:00:00)

## 2024-02-18 NOTE — Telephone Encounter (Signed)
 S/w GPA to add prognostic to skin bx; faxed requisition and all supporting documents to [facility] for testing. Fax confirmation received.

## 2024-02-18 NOTE — Assessment & Plan Note (Addendum)
 1.  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 bone metastasis: Currently on Xgeva every 3 months.   03/28/2023: CA 27-29: 84.4 04/19/2023: CA 27-29: 126 06/22/2023: CA 27-29: 204 09/13/2023: CA 27-29: 329 11/06/2023: CA 27-29: 78   Caris molecular testing: ER positive, ER positive, TMB 5, PD-L1 negative, PIK 3 CA negative  -------------------------------------------------------- Current treatment: Doxil every 4 weeks, today cycle 2 Echocardiogram 01/11/2024: EF 60 to 65%   Chemo toxicities: Cytopenias:Okay to treat with ANC of 1500 and platelets of 92 We will add Granix injection on Saturdays before each treatment. Edema: Experiencing swelling, currently on Lasix.  New chest wall nodules  Return to clinic every 4 weeks working

## 2024-02-18 NOTE — Telephone Encounter (Signed)
Called patient to offer appointment, patient did not answer.

## 2024-02-19 ENCOUNTER — Encounter: Payer: Self-pay | Admitting: Hematology and Oncology

## 2024-02-19 LAB — CANCER ANTIGEN 27.29: CA 27.29: 94.8 U/mL — ABNORMAL HIGH (ref 0.0–38.6)

## 2024-02-20 ENCOUNTER — Encounter: Payer: Self-pay | Admitting: Rehabilitation

## 2024-02-20 ENCOUNTER — Ambulatory Visit: Payer: Medicaid Other | Admitting: Rehabilitation

## 2024-02-20 DIAGNOSIS — C50911 Malignant neoplasm of unspecified site of right female breast: Secondary | ICD-10-CM

## 2024-02-20 DIAGNOSIS — C50411 Malignant neoplasm of upper-outer quadrant of right female breast: Secondary | ICD-10-CM

## 2024-02-20 DIAGNOSIS — I890 Lymphedema, not elsewhere classified: Secondary | ICD-10-CM

## 2024-02-20 NOTE — Therapy (Signed)
 OUTPATIENT PHYSICAL THERAPY  UPPER EXTREMITY ONCOLOGY   Patient Name: April [name] MRN: [id number] DOB:July 13, 1961, 63 y.o., female Today's Date: 02/20/2024  END OF SESSION:  PT End of Session - 02/20/24 1156     Visit Number 6    Number of Visits 19    Date for PT Re-Evaluation 03/03/24    PT Start Time 1105    PT Stop Time 1158    PT Time Calculation (min) 53 min    Activity Tolerance Patient tolerated treatment well    Behavior During Therapy [facility] for tasks assessed/performed                Past Medical History:  Diagnosis Date   Allergy 07/28/2012   Seasonal/Environmental allergies   Anxiety 2013   Since 2013   Arthritis 2014 per patient    knees and shoulders   Bilateral ankle fractures 07/2015   Booted and resolved    Cancer [facility]) dx June 22, 2016   right breast   Depression 2013   Multiple  episodes  in past.   Elevated cholesterol 2017   Fibromyalgia 2013   diagnosed by Dr. Corliss Skains   Genital herpes 2005   Has outbreaks monthly if not on preventative medication   GERD (gastroesophageal reflux disease) 2013   History of radiation therapy 02/07/17- 03/21/17   Right Breast- 4 field 25 fractions. 50 Gy to SCLV/PAB in 25 fractions. Right Breast Boost 10 gy in 5 fractions.   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 [facility])    Metastatic adenocarcinoma to liver [facility])    Migraine 2013   migraines   Neuromuscular disorder (HCC) 03/20/2017   neuropathy in fingers and toes from Chemo--intermittent   Obesity    Osteoporosis 03/23/2017   noted per bone density scan   Peripheral neuropathy 08/13/2017   Personal history of chemotherapy 11/2016   Personal history of radiation therapy    4/18   Right wrist fracture 06/2015   Resolved   Scoliosis of thoracic spine 01/04/2012   Skin condition 2012   patient reports periodic episodes of severe itching. She will itch and then blister at areas including her arms,  back, and buttocks.    Urinary, incontinence, stress female 07/14/2016   patient reported   Past Surgical History:  Procedure Laterality Date   AXILLARY LYMPH NODE DISSECTION Right 12/26/2016   Procedure: RIGHT AXILLARY LYMPH NODE DISSECTION;  Surgeon: Glenna Fellows, MD;  Location: [facility];  Service: General;  Laterality: Right;   BREAST LUMPECTOMY Right 2018   BREAST LUMPECTOMY WITH NEEDLE LOCALIZATION Right 12/19/2016   Procedure: RIGHT BREAST NEEDLE LOCALIZED LUMPECTOMY, RIGHT RADIOACTIVE SEED TARGETED AXILLARY SENTINEL LYMPH NODE BIOPSY;  Surgeon: Glenna Fellows, MD;  Location: [facility];  Service: General;  Laterality: Right;   IR ANGIOGRAM SELECTIVE EACH ADDITIONAL VESSEL  08/27/2023   IR ANGIOGRAM VISCERAL SELECTIVE  08/27/2023   IR EMBO ARTERIAL NOT HEMORR HEMANG INC GUIDE ROADMAPPING  08/17/2023   IR EMBO TUMOR ORGAN ISCHEMIA INFARCT INC GUIDE ROADMAPPING  08/27/2023   IR EMBO TUMOR ORGAN ISCHEMIA INFARCT INC GUIDE ROADMAPPING  10/08/2023   IR GENERIC HISTORICAL  10/09/2016   IR CV LINE INJECTION 10/09/2016 Irish Lack, MD [facility]-INTERV RAD   IR IMAGING GUIDED PORT INSERTION  06/14/2022   IR RADIOLOGIST EVAL & MGMT  08/02/2023   IR US GUIDE VASC ACCESS RIGHT  08/27/2023   LAPAROSCOPIC APPENDECTOMY N/A 11/28/2018   Procedure:  APPENDECTOMY LAPAROSCOPIC;  Surgeon: Berna Bue, MD;  Location[facility];  Service: General;  Laterality: N/A;   PORT-A-CATH REMOVAL Left 12/19/2016   Procedure: REMOVAL PORT-A-CATH;  Surgeon: Glenna Fellows, MD;  Location: [facility];  Service: General;  Laterality: Left;   PORTA CATH INSERTION  2021   PORTACATH PLACEMENT N/A 07/11/2016   Procedure: INSERTION PORT-A-CATH;  Surgeon: Glenna Fellows, MD;  Location: [facility];  Service: General;  Laterality: N/A;   RADIOACTIVE SEED GUIDED AXILLARY SENTINEL LYMPH NODE Right 12/19/2016   Procedure: RADIOACTIVE SEED GUIDED AXILLARY SENTINEL LYMPH NODE BIOPSY;   Surgeon: Glenna Fellows, MD;  Location: [facility];  Service: General;  Laterality: Right;   WISDOM TOOTH EXTRACTION  yrs ago   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) 11/09/2021   Stomatitis 08/10/2020   Varicose veins of bilateral lower extremities with other complications 07/27/2019   Dry skin dermatitis 07/27/2019   Appendicitis 11/28/2018   De Quervain's disease (tenosynovitis) 05/15/2018   Bilateral carpal tunnel syndrome 05/15/2018   Neuropathy due to chemotherapeutic drug (HCC) 02/11/2018   Osteoporosis 03/23/2017   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) 06/21/2016   Malignant neoplasm of upper-outer quadrant of right breast in female, estrogen receptor positive (HCC) 06/21/2016   Elevated blood pressure reading without diagnosis of hypertension 04/28/2016   Herpes genitalis--since 2005 per patient    Environmental and seasonal allergies 07/28/2012   Fibromyalgia    Anxiety 02/07/2012   Depression 01/04/2012   Headache-migranes 01/04/2012    PCP: Julieanne Manson, MD  REFERRING PROVIDER: Lillard Anes, NP  REFERRING DIAG:   THERAPY DIAG:  Malignant neoplasm of upper-outer quadrant of right breast in female, estrogen receptor positive (HCC)  Malignant neoplasm of right breast, stage 4 (HCC)  Lymphedema, not elsewhere classified  ONSET DATE: 2018  Rationale for Evaluation and Treatment: Rehabilitation  SUBJECTIVE:                                                                                                                                                                                           SUBJECTIVE STATEMENT: They increased my chemo and are doing more testing  PERTINENT HISTORY: treated here previously - has flexitouch. Rt breast cancer, R lumpectomy  01-04-17 with SLNB (3 were positive), then had ALNB (20 some were postive), pt completed chemo and radiation, then pt had recurrence in May 2021 and is stage IV, it has spead to lymph nodes,liver, bone (L2,  Rt ischium, L1), lung. Started new treatment with Doxil 01/21/24. I do have active cancer in the breast.    Imaging 11/26/23: Increased anasarca, particularly involving the right upper extremity and right chest wall. Trace ascites, without directly visible peritoneal metastatic Disease Pleural effusion   PAIN:  Are you having pain? Yes NPRS scale: 3/10 Pain location: Rt arm  Pain orientation: Right  PAIN TYPE: aching Pain description: constant  Aggravating factors: cleaning, using the arm  Relieving factors: maybe the gabapentin   PRECAUTIONS: Active cancer Rt breast, bone mets  RED FLAGS: None   WEIGHT BEARING RESTRICTIONS: No  FALLS:  Has patient fallen in last 6 months? No I am very unsteady standing on my own, like I have to lean on the wall when I shower.    LIVING ENVIRONMENT: Lives with: lives alone  OCCUPATION: retired  LEISURE: not reported   HAND DOMINANCE: right   PRIOR LEVEL OF FUNCTION: Independent with basic ADLs  PATIENT GOALS: reduce and manage swelling    OBJECTIVE: Note: Objective measures were completed at Evaluation unless otherwise noted.  COGNITION: Overall cognitive status: Within functional limits for tasks assessed   PALPATION: +1-2 pitting Rt hand to elbow  OBSERVATIONS / OTHER ASSESSMENTS: Rt breast with bumpiness/nodules that pt states they think is cancer starting to show through the breast.    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  POSTURE: WNL  UPPER EXTREMITY AROM/PROM:  A/PROM RIGHT   eval   Shoulder extension   Shoulder flexion   Shoulder abduction   Shoulder internal rotation   Shoulder external rotation     (Blank rows = not tested)  A/PROM LEFT   eval  Shoulder extension   Shoulder flexion    Shoulder abduction   Shoulder internal rotation   Shoulder external rotation     (Blank rows = not tested)  UPPER EXTREMITY STRENGTH:   LYMPHEDEMA ASSESSMENTS:   LANDMARK RIGHT  eval 01/28/24  At axilla  48 46  15 cm proximal to olecranon process 47.6   10 cm proximal to olecranon process 47   Olecranon process 37 - 42.5 38  15 cm proximal to ulnar styloid process 38.4   10 cm proximal to ulnar styloid process 34.8   Just proximal to ulnar styloid process 19.7 21  Across hand at thumb web space 21.4   At base of 2nd digit 7.1   (Blank rows = not tested)  LANDMARK LEFT  eval  At axilla  34.8  15 cm proximal to olecranon process 33.5  10 cm proximal to olecranon process 33.4  Olecranon process 33  15 cm proximal to ulnar styloid process 28.5  10 cm proximal to ulnar styloid process 25.4  Just proximal to ulnar styloid process 17.3  Across hand at thumb web space 20.5  At base of 2nd digit 6.7  (Blank rows = not tested)  10cm Volume on eval:  Rt arm    Lt arm  TREATMENT DATE:  02/20/24 Avoided active cancer region in the Rt breast and SCF region:  In supine: Short neck, 5 diaphragmatic breaths, L axillary nodes and establishment of interaxillary pathway, R inguinal nodes and establishment of axilloinguinal pathway, then R UE working proximal to distal, moving inner upper arm outwards and upwards, and doing both sides of forearms, spending extra time in any areas of fibrosis then retracing all steps. Then posterior upper arm and interaxillary work in sidelying. Propped arm on pillow more today for comfort.  PROM into flexion  Gave info on edemawear medium  02/15/24 Avoided active cancer region in the Rt breast and SCF region:  In supine: Short neck, 5 diaphragmatic breaths, L axillary nodes and establishment of interaxillary pathway, R  inguinal nodes and establishment of axilloinguinal pathway, then R UE working proximal to distal, moving inner upper arm outwards and upwards, and doing both sides of forearms, spending extra time in any areas of fibrosis then retracing all steps. Then posterior upper arm and interaxillary work in sidelying. Propped arm on pillow more today for comfort.  Sent note to [employer] regarding signing the CMN for garments.   02/13/24 Avoided active cancer region in the Rt breast and SCF region:  In supine: Short neck, 5 diaphragmatic breaths, L axillary nodes and establishment of interaxillary pathway, R inguinal nodes and establishment of axilloinguinal pathway, then R UE working proximal to distal, moving inner upper arm outwards and upwards, and doing both sides of forearms, spending extra time in any areas of fibrosis then retracing all steps.  Remeasured 3 places on the arm to make sure she fits in the same size garment which she does.   01/28/24 Pt is feeling very bad today so we decided to order the velcro garment for home and skip MLD.  Pt also does not feel like the bandaging did anything over the weekend and prefers not to continue due to ease of using the arm - activities like cleaning being limited.  Velcro will be ready wrap size XL average and XL gauntlet as well as XL exosoft glove for use on fingers.   Gave pt text link to order on abilico but will send in to [facility] due to [employer] but pt will know what to order if needed.   Faxed request for prescription  01/25/24 In supine: Short neck, 5 diaphragmatic breaths, L axillary nodes and establishment of interaxillary pathway, R inguinal nodes and establishment of axilloinguinal pathway, then R UE working proximal to distal, moving inner upper arm outwards and upwards, and doing both sides of forearms, spending extra time in any areas of fibrosis then retracing all steps.  Then applied lighter bandage for first time: tg soft medium, fingers  1-5, artiflex hand to axilla (cut hand foam but did not use due to shaping okay with cotton), then 6cm hand bandage, 10 cm forearm into upper arm with elbow X, and another 10cm mid forearm to axilla.  Education on removal, showering, bandage care, and not allowing any discomfort.   01/21/24 Eval performed Discussed POC options including bandaging, velcro reduction kit, and night garments as well as edema wear Gave handout on edema wear size Med Applied tg soft medium with large fold over gauntlet that pt had previously.   Discussed not sleeping in her current old sleeve    PATIENT EDUCATION:  Education details: per today's note Person educated: Patient Education method: Explanation and Handouts Education comprehension: verbalized understanding  HOME EXERCISE PROGRAM: May try flexitouch again  ASSESSMENT:  CLINICAL IMPRESSION: Continued MLD, Did note some softening in the forearm today.  Gave ordering info for edemawear to try.       OBJECTIVE IMPAIRMENTS: decreased knowledge of condition, decreased knowledge of use of DME, decreased ROM, increased edema, and increased fascial restrictions.   ACTIVITY LIMITATIONS: carrying and lifting  PARTICIPATION LIMITATIONS: community activity  PERSONAL FACTORS: Fitness, Past/current experiences, and 1-2 comorbidities: active cancer, space occupying lesion  are also affecting patient's functional outcome.   REHAB POTENTIAL: Good  CLINICAL DECISION MAKING: Evolving/moderate complexity  EVALUATION COMPLEXITY: Moderate  GOALS: Goals reviewed with patient? Yes  SHORT TERM GOALS=LTGs: Target date: 03/03/24  Pt will obtain velcro compression for long term management of her lymphedema  Baseline: Goal status: INITIAL  2.  Pt will return to use of her flexitouch with help from her daughter if able  Baseline:  Goal status: INITIAL   PLAN:  PT FREQUENCY: 3x/week  PT DURATION: 6 weeks  PLANNED INTERVENTIONS: 97164- PT Re-evaluation,  97110-Therapeutic exercises, 97530- Therapeutic activity, 97535- Self Care, 16109- Manual therapy, Patient/Family education, Therapeutic exercises, Therapeutic activity, Neuromuscular re-education, Gait training, and Self Care  PLAN FOR NEXT SESSION: Rt UE CDT - may start lighter at first for tolerance Order velcro through [employer] ASAP - double check sizing Prescription request sent over 01/28/24 order on PTs desk when back Sent over as urgent order 02/08/24 to [employer], [place] R, PT 02/20/2024, 12:00 PM

## 2024-02-22 ENCOUNTER — Ambulatory Visit: Payer: Medicaid Other | Admitting: Rehabilitation
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
## 2024-02-27 ENCOUNTER — Encounter: Payer: Self-pay | Admitting: Physician Assistant

## 2024-02-27 ENCOUNTER — Ambulatory Visit: Attending: Physician Assistant | Admitting: Physician Assistant

## 2024-02-27 VITALS — BP 108/64 | HR 105 | Ht 63.0 in | Wt 203.0 lb

## 2024-02-27 DIAGNOSIS — I059 Rheumatic mitral valve disease, unspecified: Secondary | ICD-10-CM | POA: Diagnosis not present

## 2024-02-27 DIAGNOSIS — Z170 Estrogen receptor positive status [ER+]: Secondary | ICD-10-CM

## 2024-02-27 DIAGNOSIS — C50911 Malignant neoplasm of unspecified site of right female breast: Secondary | ICD-10-CM | POA: Diagnosis not present

## 2024-02-27 DIAGNOSIS — C50411 Malignant neoplasm of upper-outer quadrant of right female breast: Secondary | ICD-10-CM | POA: Diagnosis not present

## 2024-02-27 DIAGNOSIS — Z09 Encounter for follow-up examination after completed treatment for conditions other than malignant neoplasm: Secondary | ICD-10-CM | POA: Diagnosis not present

## 2024-02-27 NOTE — Patient Instructions (Addendum)
 Medication Instructions:  Your physician recommends that you continue on your current medications as directed. Please refer to the Current Medication list given to you today.  *If you need a refill on your cardiac medications before your next appointment, please call your pharmacy*   Lab Work: NONE ordered at this time of appointment   Testing/Procedures: Your physician has requested that you have a Limited echocardiogram early Mary 2025. Echocardiography is a painless test that uses sound waves to create images of your heart. It provides your doctor with information about the size and shape of your heart and how well your heart's chambers and valves are working. This procedure takes approximately one hour. There are no restrictions for this procedure. Please do NOT wear cologne, perfume, aftershave, or lotions (deodorant is allowed). Please arrive 15 minutes prior to your appointment time.  Please note: We ask at that you not bring children with you during ultrasound (echo/ vascular) testing. Due to room size and safety concerns, children are not allowed in the ultrasound rooms during exams. Our front office staff cannot provide observation of children in our lobby area while testing is being conducted. An adult accompanying a patient to their appointment will only be allowed in the ultrasound room at the discretion of the ultrasound technician under special circumstances. We apologize for any inconvenience.   Follow-Up: At [facility], you and your health needs are our priority.  As part of our continuing mission to provide you with exceptional heart care, we have created designated Provider Care Teams.  These Care Teams include your primary Cardiologist (physician) and Advanced Practice Providers (APPs -  Physician Assistants and Nurse Practitioners) who all work together to provide you with the care you need, when you need it.  We recommend signing up for the patient portal called  "MyChart".  Sign up information is provided on this After Visit Summary.  MyChart is used to connect with patients for Virtual Visits (Telemedicine).  Patients are able to view lab/test results, encounter notes, upcoming appointments, etc.  Non-urgent messages can be sent to your provider as well.   To learn more about what you can do with MyChart, go to [email or web address].    Your next appointment:    April [name] 07/2024 [facility] end week of May post Echo.  Provider:   Micah Flesher, PA-C        Other Instructions   1st Floor: - Lobby - Registration  - Pharmacy  - Lab - Cafe  2nd Floor: - PV Lab - Diagnostic Testing (echo, CT, nuclear med)  3rd Floor: - Vacant  4th Floor: - TCTS (cardiothoracic surgery) - AFib Clinic - Structural Heart Clinic - Vascular Surgery  - Vascular Ultrasound  5th Floor: - [facility] (general and EP) - Clinical Pharmacy for coumadin, hypertension, lipid, weight-loss medications, and med management appointments    Valet parking services will be available as well.

## 2024-02-28 ENCOUNTER — Other Ambulatory Visit: Payer: Self-pay

## 2024-03-03 ENCOUNTER — Other Ambulatory Visit: Payer: Self-pay

## 2024-03-04 ENCOUNTER — Other Ambulatory Visit (HOSPITAL_COMMUNITY): Payer: Self-pay

## 2024-03-04 ENCOUNTER — Inpatient Hospital Stay

## 2024-03-04 ENCOUNTER — Telehealth: Payer: Self-pay | Admitting: *Deleted

## 2024-03-04 ENCOUNTER — Other Ambulatory Visit: Payer: Self-pay | Admitting: *Deleted

## 2024-03-04 ENCOUNTER — Other Ambulatory Visit: Payer: Self-pay

## 2024-03-04 ENCOUNTER — Ambulatory Visit

## 2024-03-04 ENCOUNTER — Encounter: Payer: Self-pay | Admitting: Adult Health

## 2024-03-04 ENCOUNTER — Encounter: Payer: Self-pay | Admitting: Hematology and Oncology

## 2024-03-04 ENCOUNTER — Inpatient Hospital Stay: Attending: Oncology | Admitting: Adult Health

## 2024-03-04 VITALS — BP 89/64 | HR 98 | Temp 98.0°F | Resp 16 | Wt 197.9 lb

## 2024-03-04 VITALS — BP 109/62 | HR 92 | Resp 16

## 2024-03-04 DIAGNOSIS — Z87891 Personal history of nicotine dependence: Secondary | ICD-10-CM | POA: Insufficient documentation

## 2024-03-04 DIAGNOSIS — K1379 Other lesions of oral mucosa: Secondary | ICD-10-CM | POA: Insufficient documentation

## 2024-03-04 DIAGNOSIS — C787 Secondary malignant neoplasm of liver and intrahepatic bile duct: Secondary | ICD-10-CM | POA: Insufficient documentation

## 2024-03-04 DIAGNOSIS — Z170 Estrogen receptor positive status [ER+]: Secondary | ICD-10-CM

## 2024-03-04 DIAGNOSIS — Z5111 Encounter for antineoplastic chemotherapy: Secondary | ICD-10-CM | POA: Insufficient documentation

## 2024-03-04 DIAGNOSIS — T451X5A Adverse effect of antineoplastic and immunosuppressive drugs, initial encounter: Secondary | ICD-10-CM | POA: Insufficient documentation

## 2024-03-04 DIAGNOSIS — R609 Edema, unspecified: Secondary | ICD-10-CM | POA: Insufficient documentation

## 2024-03-04 DIAGNOSIS — D709 Neutropenia, unspecified: Secondary | ICD-10-CM | POA: Insufficient documentation

## 2024-03-04 DIAGNOSIS — M810 Age-related osteoporosis without current pathological fracture: Secondary | ICD-10-CM | POA: Insufficient documentation

## 2024-03-04 DIAGNOSIS — Z79899 Other long term (current) drug therapy: Secondary | ICD-10-CM | POA: Diagnosis not present

## 2024-03-04 DIAGNOSIS — R978 Other abnormal tumor markers: Secondary | ICD-10-CM | POA: Diagnosis not present

## 2024-03-04 DIAGNOSIS — C773 Secondary and unspecified malignant neoplasm of axilla and upper limb lymph nodes: Secondary | ICD-10-CM | POA: Insufficient documentation

## 2024-03-04 DIAGNOSIS — R634 Abnormal weight loss: Secondary | ICD-10-CM | POA: Insufficient documentation

## 2024-03-04 DIAGNOSIS — C50411 Malignant neoplasm of upper-outer quadrant of right female breast: Secondary | ICD-10-CM | POA: Insufficient documentation

## 2024-03-04 DIAGNOSIS — M419 Scoliosis, unspecified: Secondary | ICD-10-CM | POA: Insufficient documentation

## 2024-03-04 DIAGNOSIS — M797 Fibromyalgia: Secondary | ICD-10-CM | POA: Insufficient documentation

## 2024-03-04 DIAGNOSIS — G620 Drug-induced polyneuropathy: Secondary | ICD-10-CM | POA: Insufficient documentation

## 2024-03-04 DIAGNOSIS — C50911 Malignant neoplasm of unspecified site of right female breast: Secondary | ICD-10-CM

## 2024-03-04 DIAGNOSIS — Z923 Personal history of irradiation: Secondary | ICD-10-CM | POA: Diagnosis not present

## 2024-03-04 DIAGNOSIS — C7951 Secondary malignant neoplasm of bone: Secondary | ICD-10-CM | POA: Insufficient documentation

## 2024-03-04 DIAGNOSIS — C50919 Malignant neoplasm of unspecified site of unspecified female breast: Secondary | ICD-10-CM | POA: Diagnosis not present

## 2024-03-04 DIAGNOSIS — Z5189 Encounter for other specified aftercare: Secondary | ICD-10-CM | POA: Diagnosis not present

## 2024-03-04 DIAGNOSIS — I10 Essential (primary) hypertension: Secondary | ICD-10-CM | POA: Insufficient documentation

## 2024-03-04 LAB — CBC WITH DIFFERENTIAL (CANCER CENTER ONLY)
Abs Immature Granulocytes: 0 10*3/uL (ref 0.00–0.07)
Basophils Absolute: 0 10*3/uL (ref 0.0–0.1)
Basophils Relative: 0 %
Eosinophils Absolute: 0 10*3/uL (ref 0.0–0.5)
Eosinophils Relative: 6 %
HCT: 24.2 % — ABNORMAL LOW (ref 36.0–46.0)
Hemoglobin: 8.4 g/dL — ABNORMAL LOW (ref 12.0–15.0)
Immature Granulocytes: 0 %
Lymphocytes Relative: 50 %
Lymphs Abs: 0.1 10*3/uL — ABNORMAL LOW (ref 0.7–4.0)
MCH: 36.5 pg — ABNORMAL HIGH (ref 26.0–34.0)
MCHC: 34.7 g/dL (ref 30.0–36.0)
MCV: 105.2 fL — ABNORMAL HIGH (ref 80.0–100.0)
Monocytes Absolute: 0 10*3/uL — ABNORMAL LOW (ref 0.1–1.0)
Monocytes Relative: 0 %
Neutro Abs: 0.1 10*3/uL — CL (ref 1.7–7.7)
Neutrophils Relative %: 44 %
Platelet Count: 15 10*3/uL — ABNORMAL LOW (ref 150–400)
RBC: 2.3 MIL/uL — ABNORMAL LOW (ref 3.87–5.11)
RDW: 14.8 % (ref 11.5–15.5)
Smear Review: NORMAL
WBC Count: 0.2 10*3/uL — CL (ref 4.0–10.5)
nRBC: 0 % (ref 0.0–0.2)

## 2024-03-04 LAB — CMP (CANCER CENTER ONLY)
ALT: 19 U/L (ref 0–44)
AST: 33 U/L (ref 15–41)
Albumin: 2.8 g/dL — ABNORMAL LOW (ref 3.5–5.0)
Alkaline Phosphatase: 269 U/L — ABNORMAL HIGH (ref 38–126)
Anion gap: 3 — ABNORMAL LOW (ref 5–15)
BUN: 15 mg/dL (ref 8–23)
CO2: 27 mmol/L (ref 22–32)
Calcium: 8.7 mg/dL — ABNORMAL LOW (ref 8.9–10.3)
Chloride: 109 mmol/L (ref 98–111)
Creatinine: 0.48 mg/dL (ref 0.44–1.00)
GFR, Estimated: >60 mL/min (ref >60)
Glucose, Bld: 110 mg/dL — ABNORMAL HIGH (ref 70–99)
Potassium: 3.6 mmol/L (ref 3.5–5.1)
Sodium: 139 mmol/L (ref 135–145)
Total Bilirubin: 2.5 mg/dL — ABNORMAL HIGH (ref 0.0–1.2)
Total Protein: 6 g/dL — ABNORMAL LOW (ref 6.5–8.1)

## 2024-03-04 LAB — MAGNESIUM: Magnesium: 1.8 mg/dL (ref 1.7–2.4)

## 2024-03-04 MED ORDER — MORPHINE SULFATE 20 MG/5ML PO SOLN
10.0000 mg | ORAL | 0 refills | Status: DC | PRN
  Filled 2024-03-04: qty 100, 4d supply, fill #0
  Filled 2024-03-04: qty 50, 4d supply, fill #0

## 2024-03-04 MED ORDER — SODIUM CHLORIDE 0.9% FLUSH
10.0000 mL | Freq: Once | INTRAVENOUS | Status: AC
  Administered 2024-03-04: 10 mL

## 2024-03-04 MED ORDER — LEVOFLOXACIN 500 MG PO TABS
500.0000 mg | ORAL_TABLET | Freq: Every day | ORAL | 0 refills | Status: DC
  Filled 2024-03-04: qty 5, 5d supply, fill #0
  Filled 2024-03-04: qty 10, 5d supply, fill #0

## 2024-03-04 MED ORDER — FILGRASTIM-SNDZ 480 MCG/0.8ML IJ SOSY
480.0000 ug | PREFILLED_SYRINGE | Freq: Every day | INTRAMUSCULAR | Status: DC
Start: 2024-03-05 — End: 2024-03-04
  Administered 2024-03-04: 480 ug via SUBCUTANEOUS

## 2024-03-04 MED ORDER — SODIUM CHLORIDE 0.9 % IV SOLN: INTRAVENOUS | Status: DC

## 2024-03-04 MED ORDER — HEPARIN SOD (PORK) LOCK FLUSH 100 UNIT/ML IV SOLN: 500.0000 [IU] | Freq: Once | INTRAVENOUS | Status: DC

## 2024-03-04 MED ORDER — VALACYCLOVIR HCL 1 G PO TABS: 1000.0000 mg | ORAL_TABLET | Freq: Two times a day (BID) | ORAL | 4 refills | Status: DC

## 2024-03-04 MED ORDER — ONDANSETRON 8 MG PO TBDP
8.0000 mg | ORAL_TABLET | Freq: Three times a day (TID) | ORAL | 0 refills | Status: DC | PRN
  Filled 2024-03-04 (×2): qty 20, 7d supply, fill #0

## 2024-03-04 NOTE — Telephone Encounter (Signed)
 CRITICAL VALUE STICKER  CRITICAL VALUE: WBC 0.2 ANC 0.1   RECEIVER (on-site recipient of call):  DATE & TIME NOTIFIED: 4/1 1520  MESSENGER (representative from lab):  MD NOTIFIED: Lillard Anes, NP  TIME OF NOTIFICATION: 1525  RESPONSE:  Noted

## 2024-03-04 NOTE — Progress Notes (Signed)
 Per Lillard Anes, NP okay to give Zarxio 480mg  today dt neutropenia

## 2024-03-04 NOTE — Patient Instructions (Signed)
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
## 2024-03-05 ENCOUNTER — Inpatient Hospital Stay

## 2024-03-05 VITALS — BP 108/70 | HR 88 | Temp 98.2°F | Resp 18

## 2024-03-05 DIAGNOSIS — Z170 Estrogen receptor positive status [ER+]: Secondary | ICD-10-CM

## 2024-03-05 DIAGNOSIS — C787 Secondary malignant neoplasm of liver and intrahepatic bile duct: Secondary | ICD-10-CM

## 2024-03-05 DIAGNOSIS — C50911 Malignant neoplasm of unspecified site of right female breast: Secondary | ICD-10-CM

## 2024-03-05 DIAGNOSIS — Z5111 Encounter for antineoplastic chemotherapy: Secondary | ICD-10-CM | POA: Diagnosis not present

## 2024-03-05 DIAGNOSIS — C50919 Malignant neoplasm of unspecified site of unspecified female breast: Secondary | ICD-10-CM

## 2024-03-05 MED ORDER — HEPARIN SOD (PORK) LOCK FLUSH 100 UNIT/ML IV SOLN
500.0000 [IU] | Freq: Once | INTRAVENOUS | Status: AC | PRN
  Administered 2024-03-05: 500 [IU]

## 2024-03-05 MED ORDER — SODIUM CHLORIDE 0.9% FLUSH
10.0000 mL | Freq: Once | INTRAVENOUS | Status: AC | PRN
  Administered 2024-03-05: 10 mL

## 2024-03-05 MED ORDER — SODIUM CHLORIDE 0.9 % IV SOLN: Freq: Once | INTRAVENOUS | Status: AC

## 2024-03-05 MED ORDER — FILGRASTIM-SNDZ 480 MCG/0.8ML IJ SOSY
480.0000 ug | PREFILLED_SYRINGE | Freq: Once | INTRAMUSCULAR | Status: AC
  Administered 2024-03-05: 480 ug via SUBCUTANEOUS
  Filled 2024-03-05: qty 0.8

## 2024-03-05 MED ORDER — FILGRASTIM-SNDZ 480 MCG/0.8ML IJ SOSY: 480.0000 ug | PREFILLED_SYRINGE | Freq: Once | INTRAMUSCULAR | Status: DC

## 2024-03-05 NOTE — Patient Instructions (Signed)

## 2024-03-06 ENCOUNTER — Inpatient Hospital Stay

## 2024-03-06 ENCOUNTER — Encounter: Payer: Self-pay | Admitting: Adult Health

## 2024-03-06 ENCOUNTER — Encounter: Payer: Self-pay | Admitting: Hematology and Oncology

## 2024-03-06 VITALS — BP 108/65 | HR 107 | Resp 18

## 2024-03-06 DIAGNOSIS — C50911 Malignant neoplasm of unspecified site of right female breast: Secondary | ICD-10-CM

## 2024-03-06 DIAGNOSIS — Z5111 Encounter for antineoplastic chemotherapy: Secondary | ICD-10-CM | POA: Diagnosis not present

## 2024-03-06 MED ORDER — FILGRASTIM-SNDZ 480 MCG/0.8ML IJ SOSY
480.0000 ug | PREFILLED_SYRINGE | Freq: Once | INTRAMUSCULAR | Status: AC
  Administered 2024-03-06: 480 ug via SUBCUTANEOUS
  Filled 2024-03-06: qty 0.8

## 2024-03-06 MED ORDER — SODIUM CHLORIDE 0.9 % IV SOLN: Freq: Once | INTRAVENOUS | Status: AC

## 2024-03-06 NOTE — Patient Instructions (Signed)

## 2024-03-06 NOTE — Assessment & Plan Note (Signed)
 1.  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 bone metastasis: Currently on Xgeva every 3 months.  03/28/2023: CA 27-29: 84.4 04/19/2023: CA 27-29: 126 06/22/2023: CA 27-29: 204 09/13/2023: CA 27-29: 329 11/06/2023: CA 27-29: 40  [facility] molecular testing: ER positive, ER positive, TMB 5, PD-L1 negative, PIK 3 CA negative  -------------------------------------------------------- Current treatment: Doxil every 4 weeks, cycle 2 day 15 Echocardiogram 01/11/2024: EF 60 to 65%  Oral mucositis Severe oral mucositis secondary to recent Doxil chemotherapy, causing significant pain, difficulty eating and drinking, weight loss, and hypotension. Acute and severe with visible ulceration and inflammation. - Administer liquid morphine for pain management. - Restart Valtrex for potential viral component. - Use salt water and baking soda gargles for oral hygiene. - Administer IV fluids for dehydration and hypotension. - Encourage nutritional supplementation with Boost or protein shakes. - Schedule follow-up on Friday to reassess condition and administer additional fluids if necessary. - Order stat labs to assess blood counts and other parameters.  Chemotherapy management Current oral mucositis is a known side effect of Doxil. Management of side effects is crucial for continuation of treatment. Hospitalization may be considered if symptoms do not improve. - Monitor response to oral mucositis management. - Evaluate need for chemotherapy regimen adjustments based on tolerance and side effects. - Consider hospitalization if lab results indicate severe complications or if condition does not improve.  Chemotherapy induced neutropenia ANC 0.1.  - Administer Zarxio daily x 4 days - Neutropenic precautions reviewed with patient in detail   Nutritional support Weight loss  and hypotension due to inadequate oral intake from oral mucositis. Nutritional support is essential for recovery. - Encourage intake of nutritional supplements such as Boost or protein shakes. - Monitor weight and nutritional status closely.

## 2024-03-07 ENCOUNTER — Inpatient Hospital Stay

## 2024-03-07 ENCOUNTER — Other Ambulatory Visit: Payer: Self-pay | Admitting: *Deleted

## 2024-03-07 ENCOUNTER — Inpatient Hospital Stay (HOSPITAL_BASED_OUTPATIENT_CLINIC_OR_DEPARTMENT_OTHER): Admitting: Adult Health

## 2024-03-07 ENCOUNTER — Encounter: Payer: Self-pay | Admitting: Hematology and Oncology

## 2024-03-07 ENCOUNTER — Other Ambulatory Visit: Payer: Self-pay

## 2024-03-07 ENCOUNTER — Other Ambulatory Visit (HOSPITAL_COMMUNITY): Payer: Self-pay

## 2024-03-07 VITALS — BP 107/65 | HR 100 | Temp 98.7°F | Resp 16

## 2024-03-07 DIAGNOSIS — C50411 Malignant neoplasm of upper-outer quadrant of right female breast: Secondary | ICD-10-CM | POA: Diagnosis not present

## 2024-03-07 DIAGNOSIS — C50919 Malignant neoplasm of unspecified site of unspecified female breast: Secondary | ICD-10-CM

## 2024-03-07 DIAGNOSIS — Z170 Estrogen receptor positive status [ER+]: Secondary | ICD-10-CM

## 2024-03-07 DIAGNOSIS — Z5111 Encounter for antineoplastic chemotherapy: Secondary | ICD-10-CM | POA: Diagnosis not present

## 2024-03-07 DIAGNOSIS — C50911 Malignant neoplasm of unspecified site of right female breast: Secondary | ICD-10-CM

## 2024-03-07 LAB — CBC WITH DIFFERENTIAL (CANCER CENTER ONLY)
Abs Immature Granulocytes: 0 10*3/uL (ref 0.00–0.07)
Basophils Absolute: 0 10*3/uL (ref 0.0–0.1)
Basophils Relative: 0 %
Eosinophils Absolute: 0 10*3/uL (ref 0.0–0.5)
Eosinophils Relative: 0 %
HCT: 20.8 % — ABNORMAL LOW (ref 36.0–46.0)
Hemoglobin: 7.3 g/dL — ABNORMAL LOW (ref 12.0–15.0)
Immature Granulocytes: 0 %
Lymphocytes Relative: 31 %
Lymphs Abs: 0.1 10*3/uL — ABNORMAL LOW (ref 0.7–4.0)
MCH: 36.7 pg — ABNORMAL HIGH (ref 26.0–34.0)
MCHC: 35.1 g/dL (ref 30.0–36.0)
MCV: 104.5 fL — ABNORMAL HIGH (ref 80.0–100.0)
Monocytes Absolute: 0.1 10*3/uL (ref 0.1–1.0)
Monocytes Relative: 34 %
Neutro Abs: 0.1 10*3/uL — CL (ref 1.7–7.7)
Neutrophils Relative %: 35 %
Platelet Count: 23 10*3/uL — ABNORMAL LOW (ref 150–400)
RBC: 1.99 MIL/uL — ABNORMAL LOW (ref 3.87–5.11)
RDW: 14.1 % (ref 11.5–15.5)
Smear Review: NORMAL
WBC Count: 0.3 10*3/uL — CL (ref 4.0–10.5)
nRBC: 0 % (ref 0.0–0.2)

## 2024-03-07 LAB — CMP (CANCER CENTER ONLY)
ALT: 16 U/L (ref 0–44)
AST: 24 U/L (ref 15–41)
Albumin: 2.7 g/dL — ABNORMAL LOW (ref 3.5–5.0)
Alkaline Phosphatase: 231 U/L — ABNORMAL HIGH (ref 38–126)
Anion gap: 7 (ref 5–15)
BUN: 9 mg/dL (ref 8–23)
CO2: 26 mmol/L (ref 22–32)
Calcium: 8.4 mg/dL — ABNORMAL LOW (ref 8.9–10.3)
Chloride: 101 mmol/L (ref 98–111)
Creatinine: 0.5 mg/dL (ref 0.44–1.00)
GFR, Estimated: >60 mL/min (ref >60)
Glucose, Bld: 92 mg/dL (ref 70–99)
Potassium: 3.3 mmol/L — ABNORMAL LOW (ref 3.5–5.1)
Sodium: 134 mmol/L — ABNORMAL LOW (ref 135–145)
Total Bilirubin: 2.7 mg/dL — ABNORMAL HIGH (ref 0.0–1.2)
Total Protein: 5.8 g/dL — ABNORMAL LOW (ref 6.5–8.1)

## 2024-03-07 LAB — ABO/RH: ABO/RH(D): O NEG

## 2024-03-07 LAB — PREPARE RBC (CROSSMATCH)

## 2024-03-07 LAB — SAMPLE TO BLOOD BANK

## 2024-03-07 MED ORDER — HEPARIN SOD (PORK) LOCK FLUSH 100 UNIT/ML IV SOLN
500.0000 [IU] | Freq: Once | INTRAVENOUS | Status: AC | PRN
Start: 2024-03-07 — End: 2024-03-07
  Administered 2024-03-07: 500 [IU]

## 2024-03-07 MED ORDER — NYSTATIN 100000 UNIT/ML MT SUSP
5.0000 mL | Freq: Four times a day (QID) | OROMUCOSAL | 2 refills | Status: DC | PRN
  Filled 2024-03-07: qty 240, 12d supply, fill #0

## 2024-03-07 MED ORDER — FILGRASTIM-SNDZ 300 MCG/0.5ML IJ SOSY
300.0000 ug | PREFILLED_SYRINGE | Freq: Once | INTRAMUSCULAR | Status: AC
  Administered 2024-03-07: 300 ug via SUBCUTANEOUS
  Filled 2024-03-07: qty 0.5

## 2024-03-07 MED ORDER — SODIUM CHLORIDE 0.9 % IV SOLN: Freq: Once | INTRAVENOUS | Status: AC

## 2024-03-07 MED ORDER — SODIUM CHLORIDE 0.9% FLUSH: 10.0000 mL | Freq: Once | INTRAVENOUS | Status: DC | PRN

## 2024-03-07 MED ORDER — DIPHENHYDRAMINE HCL 25 MG PO CAPS
25.0000 mg | ORAL_CAPSULE | Freq: Once | ORAL | Status: AC
Start: 2024-03-07 — End: 2024-03-07
  Administered 2024-03-07: 25 mg via ORAL
  Filled 2024-03-07: qty 1

## 2024-03-07 MED ORDER — SODIUM CHLORIDE 0.9% FLUSH
10.0000 mL | INTRAVENOUS | Status: AC | PRN
  Administered 2024-03-07: 10 mL

## 2024-03-07 MED ORDER — SODIUM CHLORIDE 0.9% IV SOLUTION
250.0000 mL | INTRAVENOUS | Status: DC
Start: 2024-03-07 — End: 2024-03-07

## 2024-03-07 MED ORDER — ACETAMINOPHEN 325 MG PO TABS
650.0000 mg | ORAL_TABLET | Freq: Once | ORAL | Status: AC
  Administered 2024-03-07: 650 mg via ORAL
  Filled 2024-03-07: qty 2

## 2024-03-07 MED ORDER — POTASSIUM CHLORIDE 10 MEQ/100ML IV SOLN
10.0000 meq | INTRAVENOUS | Status: AC
  Administered 2024-03-07 (×2): 10 meq via INTRAVENOUS
  Filled 2024-03-07 (×2): qty 100

## 2024-03-07 NOTE — Patient Instructions (Addendum)
 Rehydration, Adult Rehydration is the replacement of fluids, salts, and minerals in the body (electrolytes) that are lost during dehydration. Dehydration is when there is not enough water or other fluids in the body. This happens when you lose more fluids than you take in. Common causes of dehydration include: Not drinking enough fluids. This can occur when you are ill or doing activities that require a lot of energy, especially in hot weather. Conditions that cause loss of water or other fluids. These include diarrhea, vomiting, sweating, and urinating a lot. Other illnesses, such as fever or infection. Certain medicines, such as those that remove excess fluid from the body (diuretics). Symptoms of mild or moderate dehydration may include thirst, dry lips and mouth, and dizziness. Symptoms of severe dehydration may include increased heart rate, confusion, fainting, and not urinating. In severe cases, you may need to get fluids through an IV at the hospital. For mild or moderate cases, you can usually rehydrate at home by drinking certain fluids as told by your health care provider. What are the risks? Your health care provider will talk with you about risks. Your health care provider will talk with you about risks. This may include taking in too much fluid (overhydration). This is rare. Overhydration can cause an imbalance of electrolytes in the body, kidney failure, or a decrease in salt (sodium) levels in the body. Supplies needed: You will need an oral rehydration solution (ORS) if your health care provider tells you to use one. This is a drink to treat dehydration. It can be found in pharmacies and retail stores. How to rehydrate Fluids Follow instructions from your health care provider about what to drink. The kind of fluid and the amount you should drink depend on your condition. In general, you should choose drinks that you prefer. If told by your health care provider, drink an ORS. Make an  ORS by following instructions on the package. Start by drinking small amounts, about  cup (120 mL) every 5-10 minutes. Slowly increase how much you drink until you have taken in the amount recommended by your health care provider. Drink enough clear fluids to keep your urine pale yellow. If you were told to drink an ORS, finish it first, then start slowly drinking other clear fluids. Drink fluids such as: Water. This includes sparkling and flavored water. Drinking only water can lead to having too little sodium in your body (hyponatremia). Follow the advice of your health care provider. Water from ice chips you suck on. Fruit juice with water added to it (diluted). Sports drinks. Hot or cold herbal teas. Broth-based soups. Milk or milk products. Food Follow instructions from your health care provider about what to eat while you rehydrate. Your health care provider may recommend that you slowly begin eating regular foods in small amounts. Eat foods that contain a healthy balance of electrolytes, such as bananas, oranges, potatoes, tomatoes, and spinach. Avoid foods that are greasy or contain a lot of sugar. In some cases, you may get nutrition through a feeding tube that is passed through your nose and into your stomach (nasogastric tube, or NG tube). This may be done if you have uncontrolled vomiting or diarrhea. Drinks to avoid  Certain drinks may make dehydration worse. While you rehydrate, avoid drinking alcohol. How to tell if you are recovering from dehydration You may be getting better if: You are urinating more often than before you started rehydrating. Your urine is pale yellow. Your energy level improves. You vomit less  often. You have diarrhea less often. Your appetite improves or returns to normal. You feel less dizzy or light-headed. Your skin tone and color start to look more normal. Follow these instructions at home: Take over-the-counter and prescription medicines only  as told by your health care provider. Do not take sodium tablets. Doing this can lead to having too much sodium in your body (hypernatremia). Contact a health care provider if: You continue to have symptoms of mild or moderate dehydration, such as: Thirst. Dry lips. Slightly dry mouth. Dizziness. Dark urine or less urine than normal. Muscle cramps. You continue to vomit or have diarrhea. Get help right away if: You have symptoms of dehydration that get worse. You have a fever. You have a severe headache. You have been vomiting and have problems, such as: Your vomiting gets worse or does not go away. Your vomit includes blood or green matter (bile). You cannot eat or drink without vomiting. You have problems with urination or bowel movements, such as: Diarrhea that gets worse or does not go away. Blood in your stool (feces). This may cause stool to look black and tarry. Not urinating, or urinating only a small amount of very dark urine, within 6-8 hours. You have trouble breathing. You have symptoms that get worse with treatment. These symptoms may be an emergency. Get help right away. Call 911. Do not wait to see if the symptoms will go away. Do not drive yourself to the hospital. This information is not intended to replace advice given to you by your health care provider. Make sure you discuss any questions you have with your health care provider. Blood Transfusion, Adult, Care After The following information offers guidance on how to care for yourself after your procedure. Your health care provider may also give you more specific instructions. If you have problems or questions, contact your health care provider. What can I expect after the procedure? After the procedure, it is common to have: Bruising and soreness where the IV was inserted. A headache. Follow these instructions at home: IV insertion site care     Follow instructions from your health care provider about how  to take care of your IV insertion site. Make sure you: Wash your hands with soap and water for at least 20 seconds before and after you change your bandage (dressing). If soap and water are not available, use hand sanitizer. Change your dressing as told by your health care provider. Check your IV insertion site every day for signs of infection. Check for: Redness, swelling, or pain. Bleeding from the site. Warmth. Pus or a bad smell. General instructions Take over-the-counter and prescription medicines only as told by your health care provider. Rest as told by your health care provider. Return to your normal activities as told by your health care provider. Keep all follow-up visits. Lab tests may need to be done at certain periods to recheck your blood counts. Contact a health care provider if: You have itching or red, swollen areas of skin (hives). You have a fever or chills. You have pain in the head, back, or chest. You feel anxious or you feel weak after doing your normal activities. You have redness, swelling, warmth, or pain around the IV insertion site. You have blood coming from the IV insertion site that does not stop with pressure. You have pus or a bad smell coming from your IV insertion site. If you received your blood transfusion in an outpatient setting, you will be told whom  to contact to report any reactions. Get help right away if: You have symptoms of a serious allergic or immune system reaction, including: Trouble breathing or shortness of breath. Swelling of the face, feeling flushed, or widespread rash. Dark urine or blood in the urine. Fast heartbeat. These symptoms may be an emergency. Get help right away. Call 911. Do not wait to see if the symptoms will go away. Do not drive yourself to the hospital. Summary Bruising and soreness around the IV insertion site are common. Check your IV insertion site every day for signs of infection. Rest as told by your  health care provider. Return to your normal activities as told by your health care provider. Get help right away for symptoms of a serious allergic or immune system reaction to the blood transfusion. This information is not intended to replace advice given to you by your health care provider. Make sure you discuss any questions you have with your health care provider. Document Revised: 02/17/2022 Document Reviewed: 02/17/2022 Elsevier Patient Education  2024 [employer] Document Revised: 04/03/2022 Document Reviewed: 04/03/2022 Elsevier Patient Education  2024 [employer].

## 2024-03-07 NOTE — Progress Notes (Signed)
 Per NP request RN called in prescription for magic mouthwash.

## 2024-03-07 NOTE — Progress Notes (Signed)
 CRITICAL VALUE STICKER  CRITICAL VALUE: ANC 0.1 WBC 0.3  RECEIVER (on-site recipient of call): Adelina Mings  DATE & TIME NOTIFIED: 03/07/2024  MD NOTIFIED: Lillard Anes, NP  TIME OF NOTIFICATION: 03/07/24 at 0930  RESPONSE:  NP notified and verbalized understanding.

## 2024-03-07 NOTE — Progress Notes (Signed)
 Pt Hgb 7.3.  Verbal orders received and placed for pt to receive 1 unit PRBC's.

## 2024-03-08 ENCOUNTER — Inpatient Hospital Stay

## 2024-03-08 VITALS — BP 114/70 | HR 112 | Temp 98.3°F | Resp 16

## 2024-03-08 DIAGNOSIS — Z5111 Encounter for antineoplastic chemotherapy: Secondary | ICD-10-CM | POA: Diagnosis not present

## 2024-03-08 DIAGNOSIS — C50911 Malignant neoplasm of unspecified site of right female breast: Secondary | ICD-10-CM

## 2024-03-08 LAB — TYPE AND SCREEN
ABO/RH(D): O NEG
Antibody Screen: NEGATIVE
Unit division: 0

## 2024-03-08 LAB — BPAM RBC
Blood Product Expiration Date: 202505032359
ISSUE DATE / TIME: 202504041138
Unit Type and Rh: 9500

## 2024-03-08 MED ORDER — FILGRASTIM-SNDZ 480 MCG/0.8ML IJ SOSY
480.0000 ug | PREFILLED_SYRINGE | Freq: Once | INTRAMUSCULAR | Status: AC
  Administered 2024-03-08: 480 ug via SUBCUTANEOUS
  Filled 2024-03-08: qty 0.8

## 2024-03-09 NOTE — Assessment & Plan Note (Signed)
 1.  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 bone metastasis: Currently on Xgeva every 3 months.  03/28/2023: CA 27-29: 84.4 04/19/2023: CA 27-29: 126 06/22/2023: CA 27-29: 204 09/13/2023: CA 27-29: 329 11/06/2023: CA 27-29: 22  [facility] molecular testing: ER positive, ER positive, TMB 5, PD-L1 negative, PIK 3 CA negative  -------------------------------------------------------- Current treatment: Doxil every 4 weeks, cycle 2 day 15 Echocardiogram 01/11/2024: EF 60 to 65%  Oral mucositis Severe oral mucositis secondary to recent Doxil chemotherapy, causing significant pain, difficulty eating and drinking, weight loss, and hypotension. Acute and severe with visible ulceration and inflammation. Improved today.  Pain managed with Morphine causing drowsiness and nausea. - Consider reducing morphine dose as pain improves. - Ensure access to magic mouthwash for oral discomfort. - Denies constipation  Chemothreapy induced anemia Hemoglobin 7.3.   - Transfuse one unit of PRBCs today  Chemotherapy induced neutropenia ANC 0.1. Zarxio administered x 4 days, will administer additional dose tomorrow and recheck patient on Monday.    RTC Monday for labs and f/u.
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
## 2024-03-10 ENCOUNTER — Inpatient Hospital Stay (HOSPITAL_BASED_OUTPATIENT_CLINIC_OR_DEPARTMENT_OTHER): Admitting: Adult Health

## 2024-03-10 ENCOUNTER — Telehealth: Payer: Self-pay | Admitting: *Deleted

## 2024-03-10 ENCOUNTER — Telehealth: Payer: Self-pay | Admitting: General Surgery

## 2024-03-10 ENCOUNTER — Encounter: Payer: Self-pay | Admitting: Hematology and Oncology

## 2024-03-10 ENCOUNTER — Inpatient Hospital Stay

## 2024-03-10 ENCOUNTER — Other Ambulatory Visit (HOSPITAL_COMMUNITY): Payer: Self-pay

## 2024-03-10 VITALS — BP 119/66 | HR 96 | Temp 98.4°F | Resp 18 | Ht 63.0 in | Wt 204.0 lb

## 2024-03-10 DIAGNOSIS — Z170 Estrogen receptor positive status [ER+]: Secondary | ICD-10-CM

## 2024-03-10 DIAGNOSIS — Z5111 Encounter for antineoplastic chemotherapy: Secondary | ICD-10-CM | POA: Diagnosis not present

## 2024-03-10 DIAGNOSIS — C50411 Malignant neoplasm of upper-outer quadrant of right female breast: Secondary | ICD-10-CM | POA: Diagnosis not present

## 2024-03-10 DIAGNOSIS — C50911 Malignant neoplasm of unspecified site of right female breast: Secondary | ICD-10-CM

## 2024-03-10 LAB — CBC WITH DIFFERENTIAL (CANCER CENTER ONLY)
Abs Immature Granulocytes: 0.15 10*3/uL — ABNORMAL HIGH (ref 0.00–0.07)
Basophils Absolute: 0 10*3/uL (ref 0.0–0.1)
Basophils Relative: 1 %
Eosinophils Absolute: 0 10*3/uL (ref 0.0–0.5)
Eosinophils Relative: 0 %
HCT: 26 % — ABNORMAL LOW (ref 36.0–46.0)
Hemoglobin: 8.9 g/dL — ABNORMAL LOW (ref 12.0–15.0)
Immature Granulocytes: 8 %
Lymphocytes Relative: 10 %
Lymphs Abs: 0.2 10*3/uL — ABNORMAL LOW (ref 0.7–4.0)
MCH: 34.8 pg — ABNORMAL HIGH (ref 26.0–34.0)
MCHC: 34.2 g/dL (ref 30.0–36.0)
MCV: 101.6 fL — ABNORMAL HIGH (ref 80.0–100.0)
Monocytes Absolute: 0.5 10*3/uL (ref 0.1–1.0)
Monocytes Relative: 26 %
Neutro Abs: 1.1 10*3/uL — ABNORMAL LOW (ref 1.7–7.7)
Neutrophils Relative %: 55 %
Platelet Count: 73 10*3/uL — ABNORMAL LOW (ref 150–400)
RBC: 2.56 MIL/uL — ABNORMAL LOW (ref 3.87–5.11)
RDW: 16.7 % — ABNORMAL HIGH (ref 11.5–15.5)
WBC Count: 2 10*3/uL — ABNORMAL LOW (ref 4.0–10.5)
nRBC: 2 % — ABNORMAL HIGH (ref 0.0–0.2)

## 2024-03-10 LAB — CMP (CANCER CENTER ONLY)
ALT: 20 U/L (ref 0–44)
AST: 34 U/L (ref 15–41)
Albumin: 2.7 g/dL — ABNORMAL LOW (ref 3.5–5.0)
Alkaline Phosphatase: 243 U/L — ABNORMAL HIGH (ref 38–126)
Anion gap: 5 (ref 5–15)
BUN: 8 mg/dL (ref 8–23)
CO2: 30 mmol/L (ref 22–32)
Calcium: 8.5 mg/dL — ABNORMAL LOW (ref 8.9–10.3)
Chloride: 105 mmol/L (ref 98–111)
Creatinine: 0.47 mg/dL (ref 0.44–1.00)
GFR, Estimated: >60 mL/min (ref >60)
Glucose, Bld: 133 mg/dL — ABNORMAL HIGH (ref 70–99)
Potassium: 3.2 mmol/L — ABNORMAL LOW (ref 3.5–5.1)
Sodium: 140 mmol/L (ref 135–145)
Total Bilirubin: 2 mg/dL — ABNORMAL HIGH (ref 0.0–1.2)
Total Protein: 5.8 g/dL — ABNORMAL LOW (ref 6.5–8.1)

## 2024-03-10 MED ORDER — HEPARIN SOD (PORK) LOCK FLUSH 100 UNIT/ML IV SOLN
500.0000 [IU] | Freq: Once | INTRAVENOUS | Status: AC
  Administered 2024-03-10: 500 [IU]

## 2024-03-10 MED ORDER — FLUCONAZOLE 200 MG PO TABS
200.0000 mg | ORAL_TABLET | Freq: Every day | ORAL | 0 refills | Status: DC
  Filled 2024-03-10: qty 30, 30d supply, fill #0

## 2024-03-10 MED ORDER — SODIUM CHLORIDE 0.9% FLUSH
10.0000 mL | Freq: Once | INTRAVENOUS | Status: AC
  Administered 2024-03-10: 10 mL
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
## 2024-03-10 NOTE — Telephone Encounter (Signed)
 Alternative note [facility]

## 2024-03-10 NOTE — Telephone Encounter (Signed)
-----   Message from Noreene Filbert sent at 03/10/2024  2:42 PM EDT ----- Please let Caitlyn know her potassium is slightly decreased.  Can she restart her potassium supplement? ----- Message ----- From: Interface, Lab In [place] Sent: 03/10/2024   9:23 AM EDT To: Serena Croissant, MD

## 2024-03-10 NOTE — Telephone Encounter (Signed)
 Per Lillard Anes, NP, made pt aware in office that there was no injection needed today. ANC 1.1. Pt verbalized understanding and advised to f/u as scheduled.

## 2024-03-10 NOTE — Telephone Encounter (Signed)
 Per Lillard Anes, NP, made pt aware of message below. Pt states she was unable to take K+ due to mouth issues she had last week. Pt states she will restart and verbalized understanding

## 2024-03-12 ENCOUNTER — Encounter: Payer: Self-pay | Admitting: Hematology and Oncology

## 2024-03-12 NOTE — Assessment & Plan Note (Signed)
 1.  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 bone metastasis: Currently on Xgeva every 3 months.  03/28/2023: CA 27-29: 84.4 04/19/2023: CA 27-29: 126 06/22/2023: CA 27-29: 204 09/13/2023: CA 27-29: 329 11/06/2023: CA 27-29: 124  Caris molecular testing: ER positive, ER positive, TMB 5, PD-L1 negative, PIK 3 CA negative  -------------------------------------------------------- Current treatment: Doxil every 4 weeks, cycle 2 day 19 Echocardiogram 01/11/2024: EF 60 to 65%  Oral mucositis Significantly improved.  Drinking fluids and meal replacement shakes.  No longer requiring Morphine. - Continue Morphine PRN - Ensure access to magic mouthwash for oral discomfort. - Denies constipation  Chemothreapy induced anemia Hemoglobin pending.  Chemotherapy induced neutropenia Rechecking WBC, will administer if ANC less than 1  RTC next week for labs, f/u with Dr. Pamelia Hoit, and treatment (at dose reduction).

## 2024-03-15 ENCOUNTER — Inpatient Hospital Stay: Payer: Medicaid Other

## 2024-03-15 VITALS — BP 106/67 | HR 90 | Temp 97.6°F | Resp 16

## 2024-03-15 DIAGNOSIS — C50911 Malignant neoplasm of unspecified site of right female breast: Secondary | ICD-10-CM

## 2024-03-15 DIAGNOSIS — Z5111 Encounter for antineoplastic chemotherapy: Secondary | ICD-10-CM | POA: Diagnosis not present

## 2024-03-15 DIAGNOSIS — C50919 Malignant neoplasm of unspecified site of unspecified female breast: Secondary | ICD-10-CM

## 2024-03-15 DIAGNOSIS — Z170 Estrogen receptor positive status [ER+]: Secondary | ICD-10-CM

## 2024-03-15 MED ORDER — FILGRASTIM-SNDZ 300 MCG/0.5ML IJ SOSY
300.0000 ug | PREFILLED_SYRINGE | Freq: Once | INTRAMUSCULAR | Status: AC
Start: 2024-03-15 — End: 2024-03-15
  Administered 2024-03-15: 300 ug via SUBCUTANEOUS

## 2024-03-17 ENCOUNTER — Inpatient Hospital Stay: Payer: Medicaid Other

## 2024-03-17 ENCOUNTER — Ambulatory Visit: Payer: Medicaid Other

## 2024-03-17 ENCOUNTER — Ambulatory Visit: Payer: Medicaid Other | Admitting: Nurse Practitioner

## 2024-03-17 ENCOUNTER — Inpatient Hospital Stay (HOSPITAL_BASED_OUTPATIENT_CLINIC_OR_DEPARTMENT_OTHER): Payer: Medicaid Other | Admitting: Hematology and Oncology

## 2024-03-17 ENCOUNTER — Other Ambulatory Visit: Payer: Medicaid Other

## 2024-03-17 VITALS — BP 94/63 | HR 83 | Temp 98.0°F | Resp 18 | Ht 63.0 in | Wt 191.9 lb

## 2024-03-17 DIAGNOSIS — C50919 Malignant neoplasm of unspecified site of unspecified female breast: Secondary | ICD-10-CM

## 2024-03-17 DIAGNOSIS — C50911 Malignant neoplasm of unspecified site of right female breast: Secondary | ICD-10-CM

## 2024-03-17 DIAGNOSIS — C50411 Malignant neoplasm of upper-outer quadrant of right female breast: Secondary | ICD-10-CM

## 2024-03-17 DIAGNOSIS — Z170 Estrogen receptor positive status [ER+]: Secondary | ICD-10-CM

## 2024-03-17 DIAGNOSIS — Z5111 Encounter for antineoplastic chemotherapy: Secondary | ICD-10-CM | POA: Diagnosis not present

## 2024-03-17 LAB — CBC WITH DIFFERENTIAL (CANCER CENTER ONLY)
Abs Immature Granulocytes: 0.02 10*3/uL (ref 0.00–0.07)
Basophils Absolute: 0 10*3/uL (ref 0.0–0.1)
Basophils Relative: 1 %
Eosinophils Absolute: 0 10*3/uL (ref 0.0–0.5)
Eosinophils Relative: 0 %
HCT: 27.9 % — ABNORMAL LOW (ref 36.0–46.0)
Hemoglobin: 9.7 g/dL — ABNORMAL LOW (ref 12.0–15.0)
Immature Granulocytes: 1 %
Lymphocytes Relative: 4 %
Lymphs Abs: 0.2 10*3/uL — ABNORMAL LOW (ref 0.7–4.0)
MCH: 36.3 pg — ABNORMAL HIGH (ref 26.0–34.0)
MCHC: 34.8 g/dL (ref 30.0–36.0)
MCV: 104.5 fL — ABNORMAL HIGH (ref 80.0–100.0)
Monocytes Absolute: 0.5 10*3/uL (ref 0.1–1.0)
Monocytes Relative: 11 %
Neutro Abs: 3.7 10*3/uL (ref 1.7–7.7)
Neutrophils Relative %: 83 %
Platelet Count: 113 10*3/uL — ABNORMAL LOW (ref 150–400)
RBC: 2.67 MIL/uL — ABNORMAL LOW (ref 3.87–5.11)
RDW: 20.8 % — ABNORMAL HIGH (ref 11.5–15.5)
WBC Count: 4.4 10*3/uL (ref 4.0–10.5)
nRBC: 0 % (ref 0.0–0.2)

## 2024-03-17 LAB — CMP (CANCER CENTER ONLY)
ALT: 25 U/L (ref 0–44)
AST: 47 U/L — ABNORMAL HIGH (ref 15–41)
Albumin: 2.8 g/dL — ABNORMAL LOW (ref 3.5–5.0)
Alkaline Phosphatase: 319 U/L — ABNORMAL HIGH (ref 38–126)
Anion gap: 3 — ABNORMAL LOW (ref 5–15)
BUN: 11 mg/dL (ref 8–23)
CO2: 25 mmol/L (ref 22–32)
Calcium: 8.3 mg/dL — ABNORMAL LOW (ref 8.9–10.3)
Chloride: 110 mmol/L (ref 98–111)
Creatinine: 0.44 mg/dL (ref 0.44–1.00)
GFR, Estimated: >60 mL/min (ref >60)
Glucose, Bld: 102 mg/dL — ABNORMAL HIGH (ref 70–99)
Potassium: 3.7 mmol/L (ref 3.5–5.1)
Sodium: 138 mmol/L (ref 135–145)
Total Bilirubin: 1.5 mg/dL — ABNORMAL HIGH (ref 0.0–1.2)
Total Protein: 5.9 g/dL — ABNORMAL LOW (ref 6.5–8.1)

## 2024-03-17 MED ORDER — DOXORUBICIN HCL LIPOSOMAL CHEMO INJECTION 2 MG/ML
30.0000 mg/m2 | Freq: Once | INTRAVENOUS | Status: AC
  Administered 2024-03-17: 60 mg via INTRAVENOUS
  Filled 2024-03-17: qty 25

## 2024-03-17 MED ORDER — SODIUM CHLORIDE 0.9% FLUSH
10.0000 mL | INTRAVENOUS | Status: DC | PRN
Start: 2024-03-17 — End: 2024-03-17
  Administered 2024-03-17: 10 mL

## 2024-03-17 MED ORDER — HEPARIN SOD (PORK) LOCK FLUSH 100 UNIT/ML IV SOLN
500.0000 [IU] | Freq: Once | INTRAVENOUS | Status: AC | PRN
  Administered 2024-03-17: 500 [IU]

## 2024-03-17 MED ORDER — DEXAMETHASONE SODIUM PHOSPHATE 10 MG/ML IJ SOLN
10.0000 mg | Freq: Once | INTRAMUSCULAR | Status: AC
  Administered 2024-03-17: 10 mg via INTRAVENOUS

## 2024-03-17 MED ORDER — SODIUM CHLORIDE 0.9% FLUSH
10.0000 mL | Freq: Once | INTRAVENOUS | Status: AC
  Administered 2024-03-17: 10 mL

## 2024-03-17 MED ORDER — DEXTROSE 5 % IV SOLN
INTRAVENOUS | Status: DC
Start: 2024-03-17 — End: 2024-03-17

## 2024-03-17 NOTE — Progress Notes (Signed)
 Patient Care Team: Alveria Apley, NP as PCP - General (Family Medicine) Romie Minus, MD as PCP - Cardiology (Cardiology) Lonie Peak, MD as Attending Physician (Radiation Oncology) Axel Filler, Larna Daughters, NP as Nurse Practitioner (Hematology and Oncology) Berna Bue, MD as Consulting Physician (General Surgery) Felecia Shelling, DPM as Consulting Physician (Podiatry) Graylin Shiver, MD as Consulting Physician (Gastroenterology) Serena Croissant, MD as Attending Physician (Hematology and Oncology)  DIAGNOSIS:  Encounter Diagnosis  Name Primary?   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)   06/21/2022 - 08/03/2022 Chemotherapy   Patient is on Treatment Plan : BREAST METASTATIC fam-trastuzumab deruxtecan-nxki (Enhertu) q21d     06/21/2022 - 04/19/2023 Chemotherapy   Patient is on Treatment Plan : BREAST METASTATIC Fam-Trastuzumab Deruxtecan-nxki (Enhertu) (5.4) q21d     05/18/2023 - 07/06/2023 Chemotherapy   Patient is on Treatment Plan : BREAST METASTATIC Sacituzumab govitecan-hziy [name]) D1,8 q21d     10/16/2023 - 01/05/2024 Chemotherapy   Patient is on Treatment Plan : BREAST METASTATIC Eribulin D1,8 q21d     01/21/2024 -  Chemotherapy   Patient is on Treatment Plan : BREAST Liposomal Doxorubicin (50) q28d     Malignant neoplasm of upper-outer quadrant of right breast in female, estrogen receptor positive (HCC)  06/21/2016 Initial Diagnosis   Malignant neoplasm of upper-outer quadrant of right breast in female, estrogen receptor positive (HCC)   06/21/2016 Initial Biopsy   Right breast biopsy, 10 oclock: IDC, grade 3, ER+(95%), PR+(80%),Ki67 20%, HER-2 negative (ratio 0.67). Right axilla core biopsy:  carcinoma, grade 3, ER+(100%), PR+(90%), Ki67 25%, HER-2 negative (ratio 1.13).    07/17/2016 - 11/06/2016 Neo-Adjuvant Chemotherapy   Received 2 cycles of Doxorubicin and Cyclophosphamide, then transitioned to weekly Paclitaxel (due to repeated febrile neutropenia) x 7 cycles, stopped early due to peripheral neuropathy, then completed her final 2 cycles of Doxorubicin and Cyclophosphamide.    12/19/2016 Surgery   Right breast lumpectomy ([facility]): IDC, grade 2, 1.6cm and 0.3cm, margins negative, 3 SLN positive for metastatic carcinoma.     12/26/2016 Surgery   ALND: metastatic carcinoma in one of 20 lymph nodes, and three nodes from previous lumpectomy.  Four positive nodes, consistent with pN2a.   02/07/2017 - 03/21/2017 Radiation Therapy   Adjuvant radiation Basilio [place]): 1) Right breast and nodes - 4 field: 50 Gy in 25 fractions. IM NODES: >95% receive at least 45Gy/51fx. 50Gy to SCLV/PAB @ 2Gy /fraction x 25 fractions. 2) Right breast boost: 10 Gy in 5 fractions   04/2017 -  Anti-estrogen oral therapy   Anastrozole 1mg  daily.  Bone density 03/23/2017 finds T score of -2.6, osteoporosis, plan to start Prolia following dental clearance Anastrozole stopped 01/16/18 Exemestane 25 mg daily 02/12/18  On [name], trial randomized to endocrine therapy alone   05/27/2020 Progression   chest CT scan 05/27/2020 shows bulky mediastinal and right hilar lymphadenopathy with right pleural nodules and a small right pleural effusion, no evidence of liver or bone involvement             (a) biopsy of right breast mass 06/02/2020 shows invasive ductal carcinoma, estrogen and progesterone receptor positive, HER-2 not amplified, with an MIB-1 of 40%   06/17/2020 Treatment Plan Change    fulvestrant to start 06/17/2020             (  a) palbociclib to start 06/17/2020 at 125 mg daily 21 days on 7 days off             (b) palbociclib decreased to 125mg  every other day x 11 doses on 07/23/2020             (c) palbociclib dose  decreased to100 mg daily, 21/7, starting with September cycle             (d) Palbociclib decreased to 75mg  daily beginning with March cycle due to oral ulcers             (e) palbociclib discontinued November 2022 with progression   09/14/2021 PET scan   PET scan 09/14/2021 shows progression in liver and bone   10/05/2021 Pathology Results   liver biopsy 10/05/2021 confirms metastatic carcinoma, estrogen receptor strongly positive, progesterone receptor and HER2 negative, with an MIB-1 of 30%.   10/2021 Treatment Plan Change   fulvestrant continued, Xgeva added, palbociclib changed to abemaciclib November 2022 -Dose Decreased to 50mg  PO BID Daily   03/07/2022 Miscellaneous   [facility] molecular testing: ER positive, ER positive, MTAP: Detected, ESR 1 negative, BRCA 1 and 2 negative, MSI stable, PD-L1 negative, PIK 3 CA negative, PR negative,   06/21/2022 - 08/03/2022 Chemotherapy   Patient is on Treatment Plan : BREAST METASTATIC fam-trastuzumab deruxtecan-nxki (Enhertu) q21d     06/21/2022 - 04/19/2023 Chemotherapy   Patient is on Treatment Plan : BREAST METASTATIC Fam-Trastuzumab Deruxtecan-nxki (Enhertu) (5.4) q21d     01/25/2023 Cancer Staging   Staging form: Breast, AJCC 7th Edition - Pathologic: Stage IV (M1) - Signed by Loa Socks, NP on 01/25/2023   05/18/2023 - 07/06/2023 Chemotherapy   Patient is on Treatment Plan : BREAST METASTATIC Sacituzumab govitecan-hziy [name]) D1,8 q21d     07/23/2023 -  Chemotherapy   Xeloda 1000 mg bid 14 days on and 7 days off   08/27/2023 Procedure   Y90 hepatic radio embolization   10/16/2023 - 01/05/2024 Chemotherapy   Patient is on Treatment Plan : BREAST METASTATIC Eribulin D1,8 q21d     01/21/2024 -  Chemotherapy   Patient is on Treatment Plan : BREAST Liposomal Doxorubicin (50) q28d     Breast cancer, stage 4 (HCC)  03/09/2022 Initial Diagnosis   Breast cancer, stage 4 (HCC)   06/21/2022 - 08/03/2022 Chemotherapy   Patient is on  Treatment Plan : BREAST METASTATIC fam-trastuzumab deruxtecan-nxki (Enhertu) q21d     06/21/2022 - 04/19/2023 Chemotherapy   Patient is on Treatment Plan : BREAST METASTATIC Fam-Trastuzumab Deruxtecan-nxki (Enhertu) (5.4) q21d     05/18/2023 - 07/06/2023 Chemotherapy   Patient is on Treatment Plan : BREAST METASTATIC Sacituzumab govitecan-hziy [name]) D1,8 q21d     10/16/2023 - 01/05/2024 Chemotherapy   Patient is on Treatment Plan : BREAST METASTATIC Eribulin D1,8 q21d     01/21/2024 -  Chemotherapy   Patient is on Treatment Plan : BREAST Liposomal Doxorubicin (50) q28d       CHIEF COMPLIANT: Cycle 3 Doxil  HISTORY OF PRESENT ILLNESS: History of Present Illness The patient, with a history of cancer, presents for a follow-up visit after undergoing treatment. She reports improvement in her condition, but still experiences mouth sores and significant weight loss, dropping from 215 pounds to her current weight over the past two months. She has been trying to maintain her nutrition by drinking shakes and eating as much as she can tolerate.  The patient's tumor markers have shown a slight increase, causing some concern. They  were previously at fifty but have slowly climbed up.  The patient also expresses concerns about being around her grandchild due to her low white blood cell count. She is aware of the risks, especially when the grandchild receives live vaccinations. The patient plans to wear a mask and limit contact during her lowest point in the treatment cycle.  In addition to her cancer treatment, the patient has been dealing with pain, which has improved. She previously required liquid morphine, but reports that the pain has now completely subsided.     ALLERGIES:  is allergic to eribulin mesylate, codeine, cymbalta [duloxetine hcl], hydrocodone, ultram [tramadol hcl], and venlafaxine.  MEDICATIONS:  Current Outpatient Medications  Medication Sig Dispense Refill   buPROPion  (WELLBUTRIN XL) 150 MG 24 hr tablet Take 1 tablet (150 mg total) by mouth daily. 90 tablet 0   carvedilol (COREG) 3.125 MG tablet TAKE 1 TABLET BY MOUTH TWICE A DAY 180 tablet 1   Cetirizine HCl (ZYRTEC ALLERGY PO) Take 10 mg by mouth daily.     cyclobenzaprine (FLEXERIL) 5 MG tablet Take 1 tablet (5 mg total) by mouth 3 (three) times daily as needed for muscle spasms. 30 tablet 0   Denosumab (XGEVA [place]) Inject 120 mg into the skin every 3 (three) months.     DOXORUBICIN HCL IV Inject into the vein.     fluconazole (DIFLUCAN) 200 MG tablet Take 1 tablet (200 mg total) by mouth daily. 30 tablet 0   furosemide (LASIX) 40 MG tablet Take 1 tablet (40 mg total) by mouth 2 (two) times daily. 60 tablet 6   gabapentin (NEURONTIN) 100 MG capsule Take 1 capsule (100 mg total) by mouth 3 (three) times daily for 3 days, THEN 2 capsules (200 mg total) 3 (three) times daily for 3 days, THEN 3 capsules (300 mg total) 3 (three) times daily. 297 capsule 0   KLOR-CON M10 10 MEQ tablet TAKE 1 TABLET BY MOUTH EVERY DAY 90 tablet 1   lidocaine-prilocaine (EMLA) cream Apply to affected area once 30 g 3   magic mouthwash (nystatin, lidocaine, diphenhydrAMINE, alum & mag hydroxide) suspension Take 5 mLs by mouth 4 (four) times daily as needed for mouth pain. Suspension contains equal amounts of Maalox Extra Strength, nystatin, diphenhydramine and lidocaine. 240 mL 2   morphine 20 MG/5ML solution Take 2.5 mLs (10 mg total) by mouth every 4 (four) hours as needed. 50 mL 0   naproxen sodium (ALEVE) 220 MG tablet Take 440-660 mg by mouth 2 (two) times daily as needed (pain).     omeprazole (PRILOSEC) 20 MG capsule Take 20 mg by mouth daily.     ondansetron (ZOFRAN-ODT) 8 MG disintegrating tablet Dissolve 1 tablet (8 mg total) by mouth every 8 (eight) hours as needed for nausea or vomiting. 20 tablet 0   prochlorperazine (COMPAZINE) 10 MG tablet Take 1 tablet (10 mg total) by mouth every 6 (six) hours as needed for nausea or  vomiting. 30 tablet 1   rizatriptan (MAXALT) 5 MG tablet Take 1 tablet (5 mg total) by mouth as needed for migraine. May repeat in 2 hours if needed 10 tablet 0   valACYclovir (VALTREX) 1000 MG tablet Take 1 tablet (1,000 mg total) by mouth 2 (two) times daily. 180 tablet 4   No current facility-administered medications for this visit.    PHYSICAL EXAMINATION: ECOG PERFORMANCE STATUS: 1 - Symptomatic but completely ambulatory  Vitals:   03/17/24 1407  BP: 94/63  Pulse: 83  Resp: 18  Temp: 98 F (36.7 C)  SpO2: 98%   Filed Weights   03/17/24 1407  Weight: 191 lb 14.4 oz (87 kg)    Physical Exam   (exam performed in the presence of a chaperone)  LABORATORY DATA:  I have reviewed the data as listed    Latest Ref Rng & Units 03/10/2024    9:15 AM 03/07/2024    7:43 AM 03/04/2024    3:06 PM  CMP  Glucose 70 - 99 mg/dL 324  92  401   BUN 8 - 23 mg/dL 8  9  15    Creatinine 0.44 - 1.00 mg/dL 0.27  2.53  6.64   Sodium 135 - 145 mmol/L 140  134  139   Potassium 3.5 - 5.1 mmol/L 3.2  3.3  3.6   Chloride 98 - 111 mmol/L 105  101  109   CO2 22 - 32 mmol/L 30  26  27    Calcium 8.9 - 10.3 mg/dL 8.5  8.4  8.7   Total Protein 6.5 - 8.1 g/dL 5.8  5.8  6.0   Total Bilirubin 0.0 - 1.2 mg/dL 2.0  2.7  2.5   Alkaline Phos 38 - 126 U/L 243  231  269   AST 15 - 41 U/L 34  24  33   ALT 0 - 44 U/L 20  16  19      Lab Results  Component Value Date   WBC 4.4 03/17/2024   HGB 9.7 (L) 03/17/2024   HCT 27.9 (L) 03/17/2024   MCV 104.5 (H) 03/17/2024   PLT 113 (L) 03/17/2024   NEUTROABS 3.7 03/17/2024    ASSESSMENT & PLAN:  Malignant neoplasm of upper-outer quadrant of right breast in female, 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 bone metastasis: Currently on Xgeva every 3 months.   03/28/2023: CA 27-29: 84.4 04/19/2023:  CA 27-29: 126 06/22/2023: CA 27-29: 204 09/13/2023: CA 27-29: 329 11/06/2023: CA 27-29: 124 12/17/2023: CA 27-29: 50.4 02/18/2024: CA 27-29: 94.8   [facility] molecular testing: ER positive, ER positive, TMB 5, PD-L1 negative, PIK 3 CA negative  -------------------------------------------------------- Current treatment: Doxil every 4 weeks, today cycle 3 Echocardiogram 01/11/2024: EF 60 to 65%     Chemo toxicities: Cytopenias:Okay to treat with ANC of 1500 and platelets of 92 We will add Granix injection on Saturdays before each treatment. Edema: Experiencing swelling, currently on Lasix. Severe oral mucositis: We had to use morphine for pain relief and Magic mouthwash: We will reduce the dosage of Doxil   New chest wall nodules: Biopsy on 02/04/2024.  We will obtain the ER/PR HER2 testing as well as [facility] molecular testing on the tissue.   02/04/2024: [facility] testing identified PTEN deletion suggesting benefit to Capivasertib with fulvestrant.  (ER +95%, PR +95%, HER2 negative null score 0, MSI: Stable, TMB 5, genomic LOH: High Scans will be done in 3 weeks before her next treatment.  Return to clinic in 4 weeks to discuss results of scans and to decide on the treatment plan.  I anticipate that we may be switching her treatment to Capivasertib with fulvestrant. She will come back in 1 week to see Mardella Layman for a toxicity evaluation for fluids and to recheck her labs.   No orders of the defined types were placed in this encounter.  The patient has a good understanding of the overall plan. she agrees with it. she  will call with any problems that may develop before the next visit here. Total time spent: 30 mins including face to face time and time spent for planning, charting and co-ordination of care   Viinay K Keviana Guida, MD 03/17/24

## 2024-03-17 NOTE — Assessment & Plan Note (Signed)
 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 bone metastasis: Currently on Xgeva every 3 months.   03/28/2023: CA 27-29: 84.4 04/19/2023: CA 27-29: 126 06/22/2023: CA 27-29: 204 09/13/2023: CA 27-29: 329 11/06/2023: CA 27-29: 80   [facility] molecular testing: ER positive, ER positive, TMB 5, PD-L1 negative, PIK 3 CA negative  -------------------------------------------------------- Current treatment: Doxil every 4 weeks, today cycle 2 Echocardiogram 01/11/2024: EF 60 to 65%     Chemo toxicities: Cytopenias:Okay to treat with ANC of 1500 and platelets of 92 We will add Granix injection on Saturdays before each treatment. Edema: Experiencing swelling, currently on Lasix. Severe oral mucositis: We had to use morphine for pain relief and Magic mouthwash: We will reduce the dosage of Doxil   New chest wall nodules: Biopsy on 02/04/2024.  We will obtain the ER/PR HER2 testing as well as [facility] molecular testing on the tissue.   02/04/2024: [facility] testing identified PTEN deletion suggesting benefit to Capivasertib with fulvestrant.  (ER +95%, PR +95%, HER2 negative null score 0, MSI: Stable, TMB 5, genomic LOH: High Return to clinic every 4 weeks

## 2024-03-18 ENCOUNTER — Telehealth: Payer: Self-pay | Admitting: Hematology and Oncology

## 2024-03-18 LAB — CANCER ANTIGEN 27.29: CA 27.29: 129.6 U/mL — ABNORMAL HIGH (ref 0.0–38.6)

## 2024-03-18 NOTE — Telephone Encounter (Signed)
 Left patient a vm regarding upcoming appointment

## 2024-03-19 ENCOUNTER — Encounter: Payer: Self-pay | Admitting: Family Medicine

## 2024-03-19 ENCOUNTER — Ambulatory Visit: Admitting: Family Medicine

## 2024-03-25 ENCOUNTER — Encounter: Payer: Self-pay | Admitting: Adult Health

## 2024-03-25 ENCOUNTER — Inpatient Hospital Stay

## 2024-03-25 ENCOUNTER — Inpatient Hospital Stay (HOSPITAL_BASED_OUTPATIENT_CLINIC_OR_DEPARTMENT_OTHER): Admitting: Adult Health

## 2024-03-25 ENCOUNTER — Inpatient Hospital Stay: Admitting: Dietician

## 2024-03-25 VITALS — BP 111/66 | HR 86 | Temp 98.2°F | Resp 17 | Ht 63.0 in | Wt 191.1 lb

## 2024-03-25 DIAGNOSIS — Z170 Estrogen receptor positive status [ER+]: Secondary | ICD-10-CM

## 2024-03-25 DIAGNOSIS — C50411 Malignant neoplasm of upper-outer quadrant of right female breast: Secondary | ICD-10-CM | POA: Diagnosis not present

## 2024-03-25 DIAGNOSIS — C50911 Malignant neoplasm of unspecified site of right female breast: Secondary | ICD-10-CM

## 2024-03-25 DIAGNOSIS — Z5111 Encounter for antineoplastic chemotherapy: Secondary | ICD-10-CM | POA: Diagnosis not present

## 2024-03-25 LAB — CBC WITH DIFFERENTIAL (CANCER CENTER ONLY)
Abs Immature Granulocytes: 0.01 10*3/uL (ref 0.00–0.07)
Basophils Absolute: 0 10*3/uL (ref 0.0–0.1)
Basophils Relative: 1 %
Eosinophils Absolute: 0 10*3/uL (ref 0.0–0.5)
Eosinophils Relative: 0 %
HCT: 31.4 % — ABNORMAL LOW (ref 36.0–46.0)
Hemoglobin: 10.9 g/dL — ABNORMAL LOW (ref 12.0–15.0)
Immature Granulocytes: 1 %
Lymphocytes Relative: 10 %
Lymphs Abs: 0.2 10*3/uL — ABNORMAL LOW (ref 0.7–4.0)
MCH: 36.8 pg — ABNORMAL HIGH (ref 26.0–34.0)
MCHC: 34.7 g/dL (ref 30.0–36.0)
MCV: 106.1 fL — ABNORMAL HIGH (ref 80.0–100.0)
Monocytes Absolute: 0.1 10*3/uL (ref 0.1–1.0)
Monocytes Relative: 5 %
Neutro Abs: 1.3 10*3/uL — ABNORMAL LOW (ref 1.7–7.7)
Neutrophils Relative %: 83 %
Platelet Count: 101 10*3/uL — ABNORMAL LOW (ref 150–400)
RBC: 2.96 MIL/uL — ABNORMAL LOW (ref 3.87–5.11)
RDW: 22.1 % — ABNORMAL HIGH (ref 11.5–15.5)
WBC Count: 1.6 10*3/uL — ABNORMAL LOW (ref 4.0–10.5)
nRBC: 0 % (ref 0.0–0.2)

## 2024-03-25 LAB — CMP (CANCER CENTER ONLY)
ALT: 27 U/L (ref 0–44)
AST: 49 U/L — ABNORMAL HIGH (ref 15–41)
Albumin: 3 g/dL — ABNORMAL LOW (ref 3.5–5.0)
Alkaline Phosphatase: 321 U/L — ABNORMAL HIGH (ref 38–126)
Anion gap: 4 — ABNORMAL LOW (ref 5–15)
BUN: 13 mg/dL (ref 8–23)
CO2: 26 mmol/L (ref 22–32)
Calcium: 8.7 mg/dL — ABNORMAL LOW (ref 8.9–10.3)
Chloride: 107 mmol/L (ref 98–111)
Creatinine: 0.5 mg/dL (ref 0.44–1.00)
GFR, Estimated: >60 mL/min (ref >60)
Glucose, Bld: 100 mg/dL — ABNORMAL HIGH (ref 70–99)
Potassium: 4.2 mmol/L (ref 3.5–5.1)
Sodium: 137 mmol/L (ref 135–145)
Total Bilirubin: 1.8 mg/dL — ABNORMAL HIGH (ref 0.0–1.2)
Total Protein: 6.3 g/dL — ABNORMAL LOW (ref 6.5–8.1)

## 2024-03-25 MED ORDER — SODIUM CHLORIDE 0.9 % IV SOLN: Freq: Once | INTRAVENOUS | Status: AC

## 2024-03-25 MED ORDER — HEPARIN SOD (PORK) LOCK FLUSH 100 UNIT/ML IV SOLN
500.0000 [IU] | Freq: Once | INTRAVENOUS | Status: AC | PRN
Start: 2024-03-25 — End: 2024-03-25
  Administered 2024-03-25: 500 [IU]

## 2024-03-25 MED ORDER — SODIUM CHLORIDE 0.9% FLUSH
10.0000 mL | Freq: Once | INTRAVENOUS | Status: AC | PRN
  Administered 2024-03-25: 10 mL

## 2024-03-25 NOTE — Patient Instructions (Signed)

## 2024-03-25 NOTE — Progress Notes (Signed)
 Nutrition Assessment   Reason for Assessment: Referral (wt loss)   ASSESSMENT: 63 year old female with progression of breast cancer, estrogen receptor positive. She is currently receiving liposomal doxorubicin  q28d. Patient is under the care of Dr. Lee Public.  Past medical history includes bilateral carpal tunnel syndrome, osteoporosis, tenosynovitis, dermatitis, herpes, migraines, depression, anxiety, HTN,   Met with patient in infusion. She is receiving IVF today. Patient reports horrible mouth sores that start 3 weeks after treatment. Patient able to tell new sores are starting to form. She has persistent thrush worsened after therapy. Reports sore tongue and inflamed gums. Patient is taking daily fluconazole . Patient has poor po tolerance due to this. She reports drinking Glucerna and eating bites of rice pudding when sores are more painful. She denies DM. Patient drinking glucerna due to less sugar. Patient is planning to try cream of mushroom and cream of chicken made with milk as these do not require chewing. Patient drinking ~50 oz of water. Sometimes has a cup of coffee in the morning.    Nutrition Focused Physical Exam: deferred    Medications: wellbutrin , coreg , flexeril , diflucan , klor-con , MMW, prilosec, zofran , compazine , maxalt , valtrex    Labs: albumin  3.0 (increased from 2.8), alk phos 321, total bilirubin 1.8   Anthropometrics:   Height: 5'3" Weight: 191 lb 14.4 oz  UBW: 220-225 (January) BMI: 33.85    NUTRITION DIAGNOSIS: Unintended wt loss related to breast cancer and associated treatment side effects as evidenced by mouth sores, 13% wt loss in 3 months - severe for time frame   INTERVENTION:  Discussed importance of adequate calorie and protein energy intake to preserve LBM  Encourage soft moist high calorie high protein foods in small frequent meals - fo Suggested switching to Ensure Complete/equivalent vs Glucerna - recommend 2-3/day - samples + coupons  provided Suggested CIB mixed with fairlife whole milk as alternate ONS Educated on strategies for mouth sores, recommend baking soda salt water rinses several times daily Discussed sugar and cancer - handout provided  Encourage activity as able  Contact information provided    MONITORING, EVALUATION, GOAL: Patient will tolerate increased calories and protein to minimize further wt loss during treatment    Next Visit: To be scheduled as needed
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
## 2024-03-26 ENCOUNTER — Other Ambulatory Visit: Payer: Self-pay

## 2024-03-26 LAB — CANCER ANTIGEN 27.29: CA 27.29: 169.5 U/mL — ABNORMAL HIGH (ref 0.0–38.6)

## 2024-03-26 NOTE — Telephone Encounter (Signed)
 Called patient a couple of times to offer an appointment, patient did not answer and did not return call.

## 2024-03-27 ENCOUNTER — Encounter: Payer: Self-pay | Admitting: Obstetrics and Gynecology

## 2024-03-28 ENCOUNTER — Encounter: Payer: Self-pay | Admitting: Hematology and Oncology

## 2024-03-28 NOTE — Assessment & Plan Note (Signed)
 Breast cancer Breast cancer with stable disease. Distorted breast without significant change. Upcoming scans planned for further evaluation. - CT of chest, abdomen, and pelvis on May 5th. - Administer fluids today and Saturday. - Schedule lab work in 7-10 days, cancel if asymptomatic.  Neutropenia Neutropenia with WBC 1.6 and neutrophils 1.3. Supportive care planned. - Administer Granix  injection on May 10th.  Mouth sores Mouth sores forming, typical in third week of treatment cycle. Known chemotherapy side effect. Has magic mouthwash PRN.    Nail changes due to chemotherapy Nail changes, including purple discoloration and potential loss, attributed to chemotherapy. Ruled out fungal infection.  RTC in 3 weeks for labs, f/u, and next treatment.

## 2024-03-29 ENCOUNTER — Inpatient Hospital Stay

## 2024-03-29 VITALS — BP 112/61 | HR 87 | Temp 98.1°F | Resp 16

## 2024-03-29 DIAGNOSIS — C50911 Malignant neoplasm of unspecified site of right female breast: Secondary | ICD-10-CM

## 2024-03-29 DIAGNOSIS — Z5111 Encounter for antineoplastic chemotherapy: Secondary | ICD-10-CM | POA: Diagnosis not present

## 2024-03-29 MED ORDER — SODIUM CHLORIDE 0.9 % IV SOLN: Freq: Once | INTRAVENOUS | Status: AC

## 2024-03-29 MED ORDER — HEPARIN SOD (PORK) LOCK FLUSH 100 UNIT/ML IV SOLN
500.0000 [IU] | Freq: Once | INTRAVENOUS | Status: AC | PRN
  Administered 2024-03-29: 500 [IU]

## 2024-03-29 MED ORDER — SODIUM CHLORIDE 0.9% FLUSH
10.0000 mL | Freq: Once | INTRAVENOUS | Status: AC | PRN
  Administered 2024-03-29: 10 mL

## 2024-03-29 NOTE — Patient Instructions (Signed)

## 2024-04-04 ENCOUNTER — Inpatient Hospital Stay: Attending: Oncology

## 2024-04-04 DIAGNOSIS — C773 Secondary and unspecified malignant neoplasm of axilla and upper limb lymph nodes: Secondary | ICD-10-CM | POA: Insufficient documentation

## 2024-04-04 DIAGNOSIS — Z170 Estrogen receptor positive status [ER+]: Secondary | ICD-10-CM | POA: Diagnosis not present

## 2024-04-04 DIAGNOSIS — C7951 Secondary malignant neoplasm of bone: Secondary | ICD-10-CM | POA: Diagnosis not present

## 2024-04-04 DIAGNOSIS — R6889 Other general symptoms and signs: Secondary | ICD-10-CM | POA: Diagnosis not present

## 2024-04-04 DIAGNOSIS — C50411 Malignant neoplasm of upper-outer quadrant of right female breast: Secondary | ICD-10-CM | POA: Insufficient documentation

## 2024-04-04 DIAGNOSIS — C50911 Malignant neoplasm of unspecified site of right female breast: Secondary | ICD-10-CM

## 2024-04-04 DIAGNOSIS — C787 Secondary malignant neoplasm of liver and intrahepatic bile duct: Secondary | ICD-10-CM | POA: Diagnosis not present

## 2024-04-04 DIAGNOSIS — Z79899 Other long term (current) drug therapy: Secondary | ICD-10-CM | POA: Insufficient documentation

## 2024-04-04 DIAGNOSIS — J90 Pleural effusion, not elsewhere classified: Secondary | ICD-10-CM | POA: Diagnosis not present

## 2024-04-04 DIAGNOSIS — E860 Dehydration: Secondary | ICD-10-CM | POA: Insufficient documentation

## 2024-04-04 DIAGNOSIS — Z5111 Encounter for antineoplastic chemotherapy: Secondary | ICD-10-CM | POA: Diagnosis present

## 2024-04-04 DIAGNOSIS — K1230 Oral mucositis (ulcerative), unspecified: Secondary | ICD-10-CM | POA: Diagnosis not present

## 2024-04-04 DIAGNOSIS — R609 Edema, unspecified: Secondary | ICD-10-CM | POA: Insufficient documentation

## 2024-04-04 DIAGNOSIS — Z9221 Personal history of antineoplastic chemotherapy: Secondary | ICD-10-CM | POA: Insufficient documentation

## 2024-04-04 DIAGNOSIS — Z7952 Long term (current) use of systemic steroids: Secondary | ICD-10-CM | POA: Insufficient documentation

## 2024-04-04 DIAGNOSIS — Z79624 Long term (current) use of inhibitors of nucleotide synthesis: Secondary | ICD-10-CM | POA: Diagnosis not present

## 2024-04-04 LAB — CMP (CANCER CENTER ONLY)
ALT: 29 U/L (ref 0–44)
AST: 49 U/L — ABNORMAL HIGH (ref 15–41)
Albumin: 3 g/dL — ABNORMAL LOW (ref 3.5–5.0)
Alkaline Phosphatase: 323 U/L — ABNORMAL HIGH (ref 38–126)
Anion gap: 4 — ABNORMAL LOW (ref 5–15)
BUN: 11 mg/dL (ref 8–23)
CO2: 27 mmol/L (ref 22–32)
Calcium: 8.9 mg/dL (ref 8.9–10.3)
Chloride: 108 mmol/L (ref 98–111)
Creatinine: 0.51 mg/dL (ref 0.44–1.00)
GFR, Estimated: >60 mL/min (ref >60)
Glucose, Bld: 114 mg/dL — ABNORMAL HIGH (ref 70–99)
Potassium: 3.8 mmol/L (ref 3.5–5.1)
Sodium: 139 mmol/L (ref 135–145)
Total Bilirubin: 1.7 mg/dL — ABNORMAL HIGH (ref 0.0–1.2)
Total Protein: 6.1 g/dL — ABNORMAL LOW (ref 6.5–8.1)

## 2024-04-04 LAB — CBC WITH DIFFERENTIAL (CANCER CENTER ONLY)
Abs Immature Granulocytes: 0 10*3/uL (ref 0.00–0.07)
Basophils Absolute: 0 10*3/uL (ref 0.0–0.1)
Basophils Relative: 1 %
Eosinophils Absolute: 0 10*3/uL (ref 0.0–0.5)
Eosinophils Relative: 2 %
HCT: 27.6 % — ABNORMAL LOW (ref 36.0–46.0)
Hemoglobin: 9.6 g/dL — ABNORMAL LOW (ref 12.0–15.0)
Immature Granulocytes: 0 %
Lymphocytes Relative: 17 %
Lymphs Abs: 0.2 10*3/uL — ABNORMAL LOW (ref 0.7–4.0)
MCH: 37.6 pg — ABNORMAL HIGH (ref 26.0–34.0)
MCHC: 34.8 g/dL (ref 30.0–36.0)
MCV: 108.2 fL — ABNORMAL HIGH (ref 80.0–100.0)
Monocytes Absolute: 0.3 10*3/uL (ref 0.1–1.0)
Monocytes Relative: 26 %
Neutro Abs: 0.6 10*3/uL — ABNORMAL LOW (ref 1.7–7.7)
Neutrophils Relative %: 54 %
Platelet Count: 77 10*3/uL — ABNORMAL LOW (ref 150–400)
RBC: 2.55 MIL/uL — ABNORMAL LOW (ref 3.87–5.11)
RDW: 22.5 % — ABNORMAL HIGH (ref 11.5–15.5)
WBC Count: 1 10*3/uL — ABNORMAL LOW (ref 4.0–10.5)
nRBC: 0 % (ref 0.0–0.2)

## 2024-04-05 LAB — CANCER ANTIGEN 27.29: CA 27.29: 192.1 U/mL — ABNORMAL HIGH (ref 0.0–38.6)

## 2024-04-07 ENCOUNTER — Ambulatory Visit (HOSPITAL_COMMUNITY)
Admission: RE | Admit: 2024-04-07 | Discharge: 2024-04-07 | Disposition: A | Discharge status: Home / Self Care | Source: Ambulatory Visit | Attending: Hematology and Oncology | Admitting: Hematology and Oncology

## 2024-04-07 DIAGNOSIS — C50411 Malignant neoplasm of upper-outer quadrant of right female breast: Secondary | ICD-10-CM | POA: Insufficient documentation

## 2024-04-07 DIAGNOSIS — Z170 Estrogen receptor positive status [ER+]: Secondary | ICD-10-CM | POA: Insufficient documentation

## 2024-04-07 MED ORDER — HEPARIN SOD (PORK) LOCK FLUSH 100 UNIT/ML IV SOLN
500.0000 [IU] | Freq: Once | INTRAVENOUS | Status: AC
  Administered 2024-04-07: 500 [IU] via INTRAVENOUS

## 2024-04-07 MED ORDER — HEPARIN SOD (PORK) LOCK FLUSH 100 UNIT/ML IV SOLN
INTRAVENOUS | Status: AC
  Filled 2024-04-07: qty 5

## 2024-04-07 MED ORDER — IOHEXOL 300 MG/ML  SOLN
100.0000 mL | Freq: Once | INTRAMUSCULAR | Status: AC | PRN
  Administered 2024-04-07: 100 mL via INTRAVENOUS

## 2024-04-08 ENCOUNTER — Encounter: Payer: Self-pay | Admitting: Hematology and Oncology

## 2024-04-09 ENCOUNTER — Ambulatory Visit (HOSPITAL_COMMUNITY)
Admission: RE | Admit: 2024-04-09 | Discharge: 2024-04-09 | Disposition: A | Discharge status: Home / Self Care | Source: Ambulatory Visit | Attending: Physician Assistant | Admitting: Physician Assistant

## 2024-04-09 DIAGNOSIS — I059 Rheumatic mitral valve disease, unspecified: Secondary | ICD-10-CM | POA: Insufficient documentation

## 2024-04-09 LAB — ECHOCARDIOGRAM LIMITED
AR max vel: 2.66 cm2
AV Area VTI: 2.47 cm2
AV Area mean vel: 2.53 cm2
AV Mean grad: 5 mmHg
AV Peak grad: 8.2 mmHg
Ao pk vel: 1.43 m/s
Area-P 1/2: 4.39 cm2
MV M vel: 1.27 m/s
MV Peak grad: 6.5 mmHg
S' Lateral: 2.82 cm

## 2024-04-10 ENCOUNTER — Other Ambulatory Visit: Payer: Self-pay | Admitting: *Deleted

## 2024-04-10 ENCOUNTER — Inpatient Hospital Stay: Admitting: Hematology and Oncology

## 2024-04-10 DIAGNOSIS — C50919 Malignant neoplasm of unspecified site of unspecified female breast: Secondary | ICD-10-CM | POA: Diagnosis not present

## 2024-04-10 DIAGNOSIS — C773 Secondary and unspecified malignant neoplasm of axilla and upper limb lymph nodes: Secondary | ICD-10-CM | POA: Diagnosis not present

## 2024-04-10 DIAGNOSIS — C50911 Malignant neoplasm of unspecified site of right female breast: Secondary | ICD-10-CM

## 2024-04-10 DIAGNOSIS — Z170 Estrogen receptor positive status [ER+]: Secondary | ICD-10-CM | POA: Diagnosis not present

## 2024-04-10 DIAGNOSIS — C50411 Malignant neoplasm of upper-outer quadrant of right female breast: Secondary | ICD-10-CM

## 2024-04-10 MED ORDER — DEXAMETHASONE 4 MG PO TABS: ORAL_TABLET | ORAL | 1 refills | Status: DC

## 2024-04-10 MED ORDER — PROCHLORPERAZINE MALEATE 10 MG PO TABS: 10.0000 mg | ORAL_TABLET | Freq: Four times a day (QID) | ORAL | 1 refills | Status: DC | PRN

## 2024-04-10 MED ORDER — CARBOXYMETHYLCELL-GLYCERIN PF 0.5-0.9 % OP SOLN: 2.0000 [drp] | Freq: Four times a day (QID) | OPHTHALMIC | 11 refills | Status: DC

## 2024-04-10 MED ORDER — LIDOCAINE-PRILOCAINE 2.5-2.5 % EX CREA: TOPICAL_CREAM | CUTANEOUS | 3 refills | Status: DC

## 2024-04-10 MED ORDER — ONDANSETRON HCL 8 MG PO TABS
8.0000 mg | ORAL_TABLET | Freq: Three times a day (TID) | ORAL | 1 refills | Status: DC | PRN
Start: 2024-04-10 — End: 2024-07-23

## 2024-04-10 MED ORDER — DEXAMETHASONE 0.5 MG/5ML PO SOLN: ORAL | 4 refills | Status: DC

## 2024-04-10 NOTE — Assessment & Plan Note (Signed)
 Metastatic breast cancer with bone and liver metastasis: Prior treatments: Ibrance  fulvestrant , Verzinio fulvestrant , Enhertu , Sacituzumab, Xeloda , Y90 (10/08/2023), eribulin  2. bone metastasis: Currently on Xgeva  every 3 months.   03/28/2023: CA 27-29: 84.4 04/19/2023: CA 27-29: 126 06/22/2023: CA 27-29: 204 09/13/2023: CA 27-29: 329 11/06/2023: CA 27-29: 124 12/17/2023: CA 27-29: 50.4 02/18/2024: CA 27-29: 94.8   Caris molecular testing: ER positive, ER positive, TMB 5, PD-L1 negative, PIK 3 CA negative  -------------------------------------------------------- Current treatment: Doxil  every 4 weeks, today cycle 3 Echocardiogram 01/11/2024: EF 60 to 65%     Chemo toxicities: Cytopenias:Okay to treat with ANC of 1500 and platelets of 92 We will add Granix  injection on Saturdays before each treatment. Edema: Experiencing swelling, currently on Lasix . Severe oral mucositis: We had to use morphine  for pain relief and Magic mouthwash: We will reduce the dosage of Doxil    New chest wall nodules: Biopsy on 02/04/2024.  We will obtain the ER/PR HER2 testing as well as Caris molecular testing on the tissue.   02/04/2024: Caris testing identified PTEN deletion suggesting benefit to Capivasertib with fulvestrant .  (ER +95%, PR +95%, HER2 negative null score 0, MSI: Stable, TMB 5, genomic LOH: High  CT CAP: Liver mets 2.9 cm (was 1.7 cm), 2.5 cm (was 2.7 cm) others stable, enl sub pleural nodule 0.8 cm, increase in Left supraclav LN and pe vascular LN), stable bone mets

## 2024-04-10 NOTE — Progress Notes (Signed)
 HEMATOLOGY-ONCOLOGY TELEPHONE VISIT PROGRESS NOTE  I connected with our patient on 04/10/24 at  3:00 PM EDT by telephone and verified that I am speaking with the correct person using two identifiers.  I discussed the limitations, risks, security and privacy concerns of performing an evaluation and management service by telephone and the availability of in person appointments.  I also discussed with the patient that there may be a patient responsible charge related to this service. The patient expressed understanding and agreed to proceed.   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  Recent imaging shows an increase in the size of the primary hepatic tumor from 1.7 cm to 2.9 cm, indicating disease progression. She has previously been treated with Enhertu  and Trodelvy , with a favorable response to Enhertu . Currently, she is not experiencing oral mucositis, which was present with past treatments. She is taking Diflucan , which may have alleviated her past mouth sores. There is no shortness of breath, despite a CT scan noting a small pleural effusion. Her recent echocardiogram is normal.    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)   06/21/2022 - 08/03/2022 Chemotherapy   Patient is on Treatment Plan : BREAST METASTATIC fam-trastuzumab deruxtecan-nxki  (Enhertu ) q21d     06/21/2022 - 04/19/2023 Chemotherapy   Patient is on Treatment Plan : BREAST METASTATIC Fam-Trastuzumab Deruxtecan-nxki  (Enhertu ) (5.4) q21d     05/18/2023 - 07/06/2023 Chemotherapy   Patient is on Treatment Plan : BREAST METASTATIC Sacituzumab govitecan -hziy (Trodelvy ) D1,8 q21d     10/16/2023 - 01/05/2024 Chemotherapy   Patient is on Treatment Plan : BREAST  METASTATIC Eribulin  D1,8 q21d     01/21/2024 -  Chemotherapy   Patient is on Treatment Plan : BREAST Liposomal Doxorubicin  (50) q28d     Malignant neoplasm of upper-outer quadrant of right breast in female, estrogen receptor positive (HCC)  06/21/2016 Initial Diagnosis   Malignant neoplasm of upper-outer quadrant of right breast in female, estrogen receptor positive (HCC)   06/21/2016 Initial Biopsy   Right breast biopsy, 10 oclock: IDC, grade 3, ER+(95%), PR+(80%),Ki67 20%, HER-2 negative (ratio 0.67). Right axilla core biopsy: carcinoma, grade 3, ER+(100%), PR+(90%), Ki67 25%, HER-2 negative (ratio 1.13).    07/17/2016 - 11/06/2016 Neo-Adjuvant Chemotherapy   Received 2 cycles of Doxorubicin  and Cyclophosphamide , then transitioned to weekly Paclitaxel  (due to repeated febrile neutropenia) x 7 cycles, stopped early due to peripheral neuropathy, then completed her final 2 cycles of Doxorubicin  and Cyclophosphamide .    12/19/2016 Surgery   Right breast lumpectomy ([facility]): IDC, grade 2, 1.6cm and 0.3cm, margins negative, 3 SLN positive for metastatic carcinoma.     12/26/2016 Surgery   ALND: metastatic carcinoma in one of 20 lymph nodes, and three nodes from previous lumpectomy.  Four positive nodes, consistent with pN2a.   02/07/2017 - 03/21/2017 Radiation Therapy   Adjuvant radiation Lurena Sally): 1) Right breast and nodes - 4 field: 50 Gy in 25 fractions. IM NODES: >95% receive at least 45Gy/23fx. 50Gy to SCLV/PAB @ 2Gy /fraction x 25 fractions. 2) Right breast boost: 10 Gy in 5 fractions   04/2017 -  Anti-estrogen oral therapy   Anastrozole  1mg  daily.  Bone density 03/23/2017 finds T score of -2.6, osteoporosis, plan to start Prolia  following dental clearance  Anastrozole  stopped 01/16/18 Exemestane  25 mg daily 02/12/18  On [name], trial randomized to endocrine therapy alone   05/27/2020 Progression   chest CT scan 05/27/2020 shows bulky mediastinal and right hilar lymphadenopathy with right  pleural nodules and a small right pleural effusion, no evidence of liver or bone involvement             (a) biopsy of right breast mass 06/02/2020 shows invasive ductal carcinoma, estrogen and progesterone receptor positive, HER-2 not amplified, with an MIB-1 of 40%   06/17/2020 Treatment Plan Change    fulvestrant  to start 06/17/2020             (a) palbociclib  to start 06/17/2020 at 125 mg daily 21 days on 7 days off             (b) palbociclib  decreased to 125mg  every other day x 11 doses on 07/23/2020             (c) palbociclib  dose decreased to100 mg daily, 21/7, starting with September cycle             (d) Palbociclib  decreased to 75mg  daily beginning with March cycle due to oral ulcers             (e) palbociclib  discontinued November 2022 with progression   09/14/2021 PET scan   PET scan 09/14/2021 shows progression in liver and bone   10/05/2021 Pathology Results   liver biopsy 10/05/2021 confirms metastatic carcinoma, estrogen receptor strongly positive, progesterone receptor and HER2 negative, with an MIB-1 of 30%.   10/2021 Treatment Plan Change   fulvestrant  continued, Xgeva  added, palbociclib  changed to abemaciclib  November 2022 -Dose Decreased to 50mg  PO BID Daily   03/07/2022 Miscellaneous   Caris molecular testing: ER positive, ER positive, MTAP: Detected, ESR 1 negative, BRCA 1 and 2 negative, MSI stable, PD-L1 negative, PIK 3 CA negative, PR negative,   06/21/2022 - 08/03/2022 Chemotherapy   Patient is on Treatment Plan : BREAST METASTATIC fam-trastuzumab deruxtecan-nxki  (Enhertu ) q21d     06/21/2022 - 04/19/2023 Chemotherapy   Patient is on Treatment Plan : BREAST METASTATIC Fam-Trastuzumab Deruxtecan-nxki  (Enhertu ) (5.4) q21d     01/25/2023 Cancer Staging   Staging form: Breast, AJCC 7th Edition - Pathologic: Stage IV (M1) - Signed by Percival Brace, NP on 01/25/2023   05/18/2023 - 07/06/2023 Chemotherapy   Patient is on Treatment Plan : BREAST METASTATIC  Sacituzumab govitecan -hziy (Trodelvy ) D1,8 q21d     07/23/2023 -  Chemotherapy   Xeloda  1000 mg bid 14 days on and 7 days off   08/27/2023 Procedure   Y90 hepatic radio embolization   10/16/2023 - 01/05/2024 Chemotherapy   Patient is on Treatment Plan : BREAST METASTATIC Eribulin  D1,8 q21d     01/21/2024 -  Chemotherapy   Patient is on Treatment Plan : BREAST Liposomal Doxorubicin  (50) q28d     Breast cancer, stage 4 (HCC)  03/09/2022 Initial Diagnosis   Breast cancer, stage 4 (HCC)   06/21/2022 - 08/03/2022 Chemotherapy   Patient is on Treatment Plan : BREAST METASTATIC fam-trastuzumab deruxtecan-nxki  (Enhertu ) q21d     06/21/2022 - 04/19/2023 Chemotherapy   Patient is on Treatment Plan : BREAST METASTATIC Fam-Trastuzumab Deruxtecan-nxki  (Enhertu ) (5.4) q21d     05/18/2023 - 07/06/2023 Chemotherapy   Patient is on Treatment Plan : BREAST METASTATIC Sacituzumab govitecan -hziy (Trodelvy ) D1,8 q21d     10/16/2023 - 01/05/2024 Chemotherapy   Patient is on Treatment Plan : BREAST METASTATIC Eribulin  D1,8 q21d     01/21/2024 -  Chemotherapy   Patient is on Treatment Plan : BREAST Liposomal Doxorubicin  (50) q28d       REVIEW OF SYSTEMS:   Constitutional: Denies fevers, chills or abnormal weight loss All other systems were reviewed with the patient and are negative. Observations/Objective:     Assessment Plan:  Malignant neoplasm of upper-outer quadrant of right breast in female, estrogen receptor positive (HCC) Metastatic breast cancer with bone and liver metastasis: Prior treatments: Ibrance  fulvestrant , Verzinio fulvestrant , Enhertu , Sacituzumab, Xeloda , Y90 (10/08/2023), eribulin  2. bone metastasis: Currently on Xgeva  every 3 months.   03/28/2023: CA 27-29: 84.4 04/19/2023: CA 27-29: 126 06/22/2023: CA 27-29: 204 09/13/2023: CA 27-29: 329 11/06/2023: CA 27-29: 124 12/17/2023: CA 27-29: 50.4 02/18/2024: CA 27-29: 94.8   Caris molecular testing: ER positive, ER positive, TMB 5, PD-L1  negative, PIK 3 CA negative  -------------------------------------------------------- Current treatment: Doxil  every 4 weeks, today cycle 3 Echocardiogram 01/11/2024: EF 60 to 65%     Chemo toxicities: Cytopenias:Okay to treat with ANC of 1500 and platelets of 92 We will add Granix  injection on Saturdays before each treatment. Edema: Experiencing swelling, currently on Lasix . Severe oral mucositis: We had to use morphine  for pain relief and Magic mouthwash: We will reduce the dosage of Doxil    New chest wall nodules: Biopsy on 02/04/2024.  We will obtain the ER/PR HER2 testing as well as Caris molecular testing on the tissue.   02/04/2024: Caris testing identified PTEN deletion suggesting benefit to Capivasertib with fulvestrant .  (ER +95%, PR +95%, HER2 negative null score 0, MSI: Stable, TMB 5, genomic LOH: High  CT CAP: Liver mets 2.9 cm (was 1.7 cm), 2.5 cm (was 2.7 cm) others stable, enl sub pleural nodule 0.8 cm, increase in Left supraclav LN and pe vascular LN), stable bone mets  Recommendation: Datopotamab deruxtecan (datroway) Discussed side effects including mouth sores and nausea and keratitis and blood and liver enzyme elevations as potential side effects Patient is agreeable F/U in 1 week for chemo   Assessment & Plan Metastatic breast cancer with liver progression Imaging shows significant liver metastasis progression. Primary tumor increased from 1.7 cm to 2.9 cm. Datopotamab deruxtecan (Datroway) proposed due to TROP2 targeting and previous Enhertu  tolerance. Expected superior efficacy and tolerance over Doxil . Advised to report ocular symptoms. - Initiate Datopotamab deruxtecan (Datroway) treatment next Thursday, pending authorization. - Prescribe dexamethasone  mouthwash for oral mucositis prevention. - Continue fluconazole  for fungal infection prophylaxis. - Monitor for xerophthalmia and corneal irritation, report any symptoms.  Malignant neoplasm of breast Breast cancer  with metastatic progression to the liver. Management focused on metastatic disease.      I discussed the assessment and treatment plan with the patient. The patient was provided an opportunity to ask questions and all were answered. The patient agreed with the plan and demonstrated an understanding of the instructions. The patient was advised to call back or seek an in-person evaluation if the symptoms worsen or if the condition fails to improve as anticipated.   I provided 30 minutes of non-face-to-face time during this encounter.  This includes time for charting and coordination of care   Margert Sheerer, MD

## 2024-04-10 NOTE — Progress Notes (Signed)
 DISCONTINUE ON PATHWAY REGIMEN - Breast     A cycle is every 28 days:     Liposomal doxorubicin    **Always confirm dose/schedule in your pharmacy ordering system**  PRIOR TREATMENT: [id number]: Liposomal Doxorubicin  50 mg/m2 q28 Days  START OFF PATHWAY REGIMEN - Breast   [id number]:Datopotamab deruxtecan 6 mg/kg IV D1 q21 Days:   A cycle is every 21 days:     Datopotamab deruxtecan-dlnk   **Always confirm dose/schedule in your pharmacy ordering system**  Patient Characteristics: Distant Metastases or Locoregional Recurrent Disease - Unresected, M0 or Locally Advanced Unresectable Disease Progressing after Neoadjuvant and Local Therapies, M0, HER2 Low/Negative, ER Positive, Chemotherapy, HER2 Low, Third Line and Beyond, [employer] or Not a Candidate for Molecular Targeted Therapy Therapeutic Status: Distant Metastases HER2 Status: Low ER Status: Positive (+) PR Status: Positive (+) Therapy Approach Indicated: Standard Chemotherapy/Endocrine Therapy Line of Therapy: Third Line and Beyond Intent of Therapy: Non-Curative / Palliative Intent, Discussed with Patient

## 2024-04-10 NOTE — Progress Notes (Signed)
 Verbal orders received from MD for pt to receive Zarxio  480 mcg injection on 04/12/24. Orders placed under sign and held.

## 2024-04-11 ENCOUNTER — Encounter: Payer: Self-pay | Admitting: Pharmacist

## 2024-04-11 ENCOUNTER — Encounter: Payer: Self-pay | Admitting: *Deleted

## 2024-04-11 ENCOUNTER — Encounter: Payer: Self-pay | Admitting: Hematology and Oncology

## 2024-04-11 ENCOUNTER — Telehealth: Payer: Self-pay | Admitting: Pharmacist

## 2024-04-11 ENCOUNTER — Other Ambulatory Visit

## 2024-04-11 ENCOUNTER — Ambulatory Visit

## 2024-04-11 NOTE — Telephone Encounter (Signed)
 [facility]       Telephone: [phone]?Fax: [phone]   Oncology Clinical Pharmacist Practitioner Initial Assessment  April [name] is a 63 y.o. female with a diagnosis of breast cancer. They were contacted today via telephone visit.  I connected with Bonnie Butters today by telephone and verified that I was speaking with the correct person using two patient identifiers. I discussed the limitations, risks, security and privacy concerns of performing an evaluation and management service by telemedicine and the availability of in-person appointments. The patient/caregiver expressed understanding and agreed to proceed.  Other persons participating in the visit and their role in the encounter: none   Patient's location: home  Provider's location: clinic  Indication/Regimen Datopotamab deruxtecan [facility]) is being used appropriately by Dr. Vinay Gudena.      Wt Readings from Last 1 Encounters:  03/25/24 191 lb 1.6 oz (86.7 kg)    Estimated body surface area is 1.96 meters squared as calculated from the following:   Height as of 03/25/24: 5\' 3"  (1.6 m).   Weight as of 03/25/24: 191 lb 1.6 oz (86.7 kg).  The dosing regimen is every 21 days until disease progression or unacceptable toxicity  Datopotamab deruxtecan (6 mg/kg) on Day 1  Dose Modifications None at this time  Allergies Allergies  Allergen Reactions   Eribulin  Mesylate Palpitations    Patient developed a flushing sensation and palpitations after her treatment had just completed.   Codeine  Nausea And Vomiting   Cymbalta  [Duloxetine  Hcl] Other (See Comments)    Causes sores on arm   Hydrocodone  Nausea Only and Other (See Comments)    dizziness   Ultram  [Tramadol  Hcl] Nausea Only   Venlafaxine  Other (See Comments)    Causes sores on arm    Vitals: No vitals or labs were done today for this virtual chemotherapy education visit     03/29/2024   10:22 AM 03/29/2024    7:58 AM 03/25/2024     9:51 AM  Oncology Vitals  Height   160 cm  Weight   86.682 kg  Weight (lbs)   191 lbs 2 oz  BMI   33.85 kg/m2  Temp 98.1 F (36.7 C) 97.5 F (36.4 C) 98.2 F (36.8 C)  Pulse Rate 87 101 86  BP 112/61 103/63 111/66  Resp 16 17 17   SpO2 100 % 98 % 99 %  BSA (m2)   1.96 m2     Laboratory Data    Latest Ref Rng & Units 04/04/2024    9:10 AM 03/25/2024    9:25 AM 03/17/2024    1:39 PM  CBC EXTENDED  WBC 4.0 - 10.5 K/uL 1.0  1.6  4.4   RBC 3.87 - 5.11 MIL/uL 2.55  2.96  2.67   Hemoglobin 12.0 - 15.0 g/dL 9.6  46.9  9.7   HCT 62.9 - 46.0 % 27.6  31.4  27.9   Platelets 150 - 400 K/uL 77  101  113   NEUT# 1.7 - 7.7 K/uL 0.6  1.3  3.7   Lymph# 0.7 - 4.0 K/uL 0.2  0.2  0.2        Latest Ref Rng & Units 04/04/2024    9:10 AM 03/25/2024    9:25 AM 03/17/2024    1:39 PM  CMP  Glucose 70 - 99 mg/dL 528  413  244   BUN 8 - 23 mg/dL 11  13  11    Creatinine 0.44 - 1.00 mg/dL 0.10  0.50  0.44   Sodium 135 - 145 mmol/L 139  137  138   Potassium 3.5 - 5.1 mmol/L 3.8  4.2  3.7   Chloride 98 - 111 mmol/L 108  107  110   CO2 22 - 32 mmol/L 27  26  25    Calcium 8.9 - 10.3 mg/dL 8.9  8.7  8.3   Total Protein 6.5 - 8.1 g/dL 6.1  6.3  5.9   Total Bilirubin 0.0 - 1.2 mg/dL 1.7  1.8  1.5   Alkaline Phos 38 - 126 U/L 323  321  319   AST 15 - 41 U/L 49  49  47   ALT 0 - 44 U/L 29  27  25     Lab Results  Component Value Date   MG 1.8 03/04/2024   MG 1.7 01/08/2024   MG 1.8 12/17/2023   Lab Results  Component Value Date   CA2729 192.1 (H) 04/04/2024   CA2729 169.5 (H) 03/25/2024   CA2729 129.6 (H) 03/17/2024     Contraindications Contraindications were reviewed? Yes Contraindications to therapy were identified? No   Safety Precautions The following safety precautions for the use of datopotamab deruxtecan were reviewed:  Fever: reviewed the importance of having a thermometer and the Centers for Disease Control and Prevention (CDC) definition of fever which is 100.23F (38C) or higher.  Patient should call 24/7 triage at [phone] if experiencing a fever or any other symptoms Changes in kidney function Changes in liver function Constipation Diarrhea (loose and/or urgent bowel movements) Fatigue Hair loss Low calcium Pneumonitis Stomatitis Ocular toxicity Infusion reactions Handling body fluids and waste Intimacy, sexual activity, contraception, fertility, breastfeeding  Medication Reconciliation Current Outpatient Medications  Medication Sig Dispense Refill   buPROPion  (WELLBUTRIN  XL) 150 MG 24 hr tablet Take 1 tablet (150 mg total) by mouth daily. 90 tablet 0   Denosumab  (XGEVA  [place]) Inject 120 mg into the skin every 3 (three) months.     fluconazole  (DIFLUCAN ) 200 MG tablet Take 1 tablet (200 mg total) by mouth daily. 30 tablet 0   KLOR-CON  M10 10 MEQ tablet TAKE 1 TABLET BY MOUTH EVERY DAY 90 tablet 1   loratadine  (CLARITIN ) 10 MG tablet Take 10 mg by mouth daily.     omeprazole  (PRILOSEC) 20 MG capsule Take 20 mg by mouth daily.     valACYclovir  (VALTREX ) 1000 MG tablet Take 1 tablet (1,000 mg total) by mouth 2 (two) times daily. 180 tablet 4   Carboxymethylcell-Glycerin  PF 0.5-0.9 % SOLN Place 2 drops into both eyes 4 (four) times daily. And as needed. (Patient not taking: Reported on 04/11/2024) 1 each 11   carvedilol  (COREG ) 3.125 MG tablet TAKE 1 TABLET BY MOUTH TWICE A DAY (Patient not taking: Reported on 04/11/2024) 180 tablet 1   cyclobenzaprine  (FLEXERIL ) 5 MG tablet Take 1 tablet (5 mg total) by mouth 3 (three) times daily as needed for muscle spasms. (Patient not taking: Reported on 04/11/2024) 30 tablet 0   dexamethasone  (DECADRON ) 0.5 MG/5ML solution Swish 10 mL (1 mg) by mouth for 2 minutes, then spit. Repeat 4 times daily. Start on 1st day of datopotamab deruxtecan. (Patient not taking: Reported on 04/11/2024) 500 mL 4   dexamethasone  (DECADRON ) 4 MG tablet Take 2 tablets (8 mg) by mouth daily for 3 days starting the day after chemotherapy. Take with food.  (Patient not taking: Reported on 04/11/2024) 30 tablet 1   lidocaine -prilocaine  (EMLA ) cream Apply to affected area once (Patient not taking:  Reported on 04/11/2024) 30 g 3   magic mouthwash (nystatin , lidocaine , diphenhydrAMINE , alum & mag hydroxide) suspension Take 5 mLs by mouth 4 (four) times daily as needed for mouth pain. Suspension contains equal amounts of Maalox Extra Strength, nystatin , diphenhydramine  and lidocaine . (Patient not taking: Reported on 04/11/2024) 240 mL 2   naproxen sodium (ALEVE) 220 MG tablet Take 440-660 mg by mouth 2 (two) times daily as needed (pain). (Patient not taking: Reported on 04/11/2024)     ondansetron  (ZOFRAN ) 8 MG tablet Take 1 tablet (8 mg total) by mouth every 8 (eight) hours as needed for nausea or vomiting. Start on the third day after chemotherapy. (Patient not taking: Reported on 04/11/2024) 30 tablet 1   prochlorperazine  (COMPAZINE ) 10 MG tablet Take 1 tablet (10 mg total) by mouth every 6 (six) hours as needed for nausea or vomiting. (Patient not taking: Reported on 04/11/2024) 30 tablet 1   rizatriptan  (MAXALT ) 5 MG tablet Take 1 tablet (5 mg total) by mouth as needed for migraine. May repeat in 2 hours if needed (Patient not taking: Reported on 04/11/2024) 10 tablet 0   No current facility-administered medications for this visit.   Medication reconciliation is based on the patient's most recent medication list in the electronic medical record (EMR) including herbal products and OTC medications.   The patient's medication list was reviewed today with the patient? Yes   Drug-drug interactions (DDIs) DDIs were evaluated? Yes Significant DDIs identified? No   Drug-Food Interactions Drug-food interactions were evaluated? Yes Drug-food interactions identified? No   Follow-up Plan  Treatment start date: Planned for 04/17/24 but not scheduled yet. Will let Dr. Gudena and staff know Port placement date: 06/14/22 We reviewed the prescriptions, premedications, and  treatment regimen with the patient. Possible side effects of the treatment regimen were reviewed and management strategies were discussed Eye exam should be done prior to starting datopotamab deruxtecan. Reviewed how to administer eye drops Reviewed how to administer dexamethasone  mouthwash for stomatitis prevention Ms Crusoe states her eye exam will be done on 04/16/24 Labs, Dr. Gudena, and datopotamab deruxtecan planned for 04/17/24. Continue denosumab  120 mg (Xgeva ) SubQ every 12 weeks, next due on 05/12/24, last given 02/18/24. Recommended calcium/vitamin d  supplement start. WNL at last visit. Bonnie Butters can follow up with clinical pharmacy as deemed necessary by Dr. Cameron Cea going forward   Bonnie Butters participated in the discussion, expressed understanding, and voiced agreement with the above plan. All questions were answered to her satisfaction. The patient was advised to contact the clinic at [phone] with any questions or concerns prior to her return visit.   I spent 30 minutes assessing the patient.  Brittiany Wiehe A. Webb Hake, PharmD, BCOP, CPP  Althea Atkinson, RPH-CPP, 04/11/2024 1:24 PM  **Disclaimer: This note was dictated with voice recognition software. Similar sounding words can inadvertently be transcribed and this note may contain transcription errors which may not have been corrected upon publication of note.**

## 2024-04-11 NOTE — Progress Notes (Signed)
 MD request pt undergo slit lamp and fundoscopy with ophthalmology prior to starting Datroway.  RN reached out to pt Opthalmologist with [facility] [address] in [place] ([phone]) who has scheduled pt for exam on Monday 04/14/24 at 1030 am.  Pt educated and verbalized understanding.

## 2024-04-11 NOTE — Progress Notes (Unsigned)
 Received message from pt stating she had to reschedule her eye exam to Wednesday 04/16/24 due to transportation.  RN reviewed with MD who states it is okay for pt to proceed with first treatment of Datroway prior to baseline eye exam.

## 2024-04-12 ENCOUNTER — Inpatient Hospital Stay: Payer: Medicaid Other

## 2024-04-12 VITALS — BP 99/70 | HR 106 | Temp 97.5°F | Resp 16

## 2024-04-12 DIAGNOSIS — D702 Other drug-induced agranulocytosis: Secondary | ICD-10-CM

## 2024-04-12 DIAGNOSIS — Z5111 Encounter for antineoplastic chemotherapy: Secondary | ICD-10-CM | POA: Diagnosis not present

## 2024-04-12 DIAGNOSIS — C50911 Malignant neoplasm of unspecified site of right female breast: Secondary | ICD-10-CM

## 2024-04-12 MED ORDER — FILGRASTIM-SNDZ 480 MCG/0.8ML IJ SOSY
480.0000 ug | PREFILLED_SYRINGE | Freq: Once | INTRAMUSCULAR | Status: AC
  Administered 2024-04-12: 480 ug via SUBCUTANEOUS
  Filled 2024-04-12: qty 0.8

## 2024-04-14 ENCOUNTER — Inpatient Hospital Stay: Payer: Medicaid Other

## 2024-04-14 ENCOUNTER — Inpatient Hospital Stay: Payer: Medicaid Other | Admitting: Hematology and Oncology

## 2024-04-15 ENCOUNTER — Inpatient Hospital Stay (HOSPITAL_BASED_OUTPATIENT_CLINIC_OR_DEPARTMENT_OTHER): Admitting: Physician Assistant

## 2024-04-15 ENCOUNTER — Inpatient Hospital Stay (HOSPITAL_BASED_OUTPATIENT_CLINIC_OR_DEPARTMENT_OTHER): Admitting: Hematology and Oncology

## 2024-04-15 ENCOUNTER — Inpatient Hospital Stay

## 2024-04-15 ENCOUNTER — Ambulatory Visit (HOSPITAL_COMMUNITY): Admitting: Cardiology

## 2024-04-15 ENCOUNTER — Encounter: Payer: Self-pay | Admitting: Hematology and Oncology

## 2024-04-15 VITALS — BP 130/77 | HR 95 | Resp 16

## 2024-04-15 VITALS — BP 130/77 | HR 78 | Resp 16

## 2024-04-15 VITALS — BP 118/78 | HR 74 | Temp 97.9°F | Resp 18 | Ht 63.0 in | Wt 192.0 lb

## 2024-04-15 DIAGNOSIS — C50411 Malignant neoplasm of upper-outer quadrant of right female breast: Secondary | ICD-10-CM

## 2024-04-15 DIAGNOSIS — C50919 Malignant neoplasm of unspecified site of unspecified female breast: Secondary | ICD-10-CM

## 2024-04-15 DIAGNOSIS — T50905A Adverse effect of unspecified drugs, medicaments and biological substances, initial encounter: Secondary | ICD-10-CM | POA: Diagnosis not present

## 2024-04-15 DIAGNOSIS — C50911 Malignant neoplasm of unspecified site of right female breast: Secondary | ICD-10-CM

## 2024-04-15 DIAGNOSIS — T451X5A Adverse effect of antineoplastic and immunosuppressive drugs, initial encounter: Secondary | ICD-10-CM

## 2024-04-15 DIAGNOSIS — R059 Cough, unspecified: Secondary | ICD-10-CM | POA: Diagnosis not present

## 2024-04-15 DIAGNOSIS — R0982 Postnasal drip: Secondary | ICD-10-CM

## 2024-04-15 DIAGNOSIS — Z5111 Encounter for antineoplastic chemotherapy: Secondary | ICD-10-CM | POA: Diagnosis not present

## 2024-04-15 DIAGNOSIS — Z170 Estrogen receptor positive status [ER+]: Secondary | ICD-10-CM

## 2024-04-15 LAB — CBC WITH DIFFERENTIAL (CANCER CENTER ONLY)
Abs Immature Granulocytes: 0.02 10*3/uL (ref 0.00–0.07)
Basophils Absolute: 0 10*3/uL (ref 0.0–0.1)
Basophils Relative: 1 %
Eosinophils Absolute: 0 10*3/uL (ref 0.0–0.5)
Eosinophils Relative: 0 %
HCT: 28.8 % — ABNORMAL LOW (ref 36.0–46.0)
Hemoglobin: 10 g/dL — ABNORMAL LOW (ref 12.0–15.0)
Immature Granulocytes: 1 %
Lymphocytes Relative: 9 %
Lymphs Abs: 0.2 10*3/uL — ABNORMAL LOW (ref 0.7–4.0)
MCH: 38.8 pg — ABNORMAL HIGH (ref 26.0–34.0)
MCHC: 34.7 g/dL (ref 30.0–36.0)
MCV: 111.6 fL — ABNORMAL HIGH (ref 80.0–100.0)
Monocytes Absolute: 0.4 10*3/uL (ref 0.1–1.0)
Monocytes Relative: 18 %
Neutro Abs: 1.7 10*3/uL (ref 1.7–7.7)
Neutrophils Relative %: 71 %
Platelet Count: 122 10*3/uL — ABNORMAL LOW (ref 150–400)
RBC: 2.58 MIL/uL — ABNORMAL LOW (ref 3.87–5.11)
Smear Review: NORMAL
WBC Count: 2.4 10*3/uL — ABNORMAL LOW (ref 4.0–10.5)
nRBC: 0 % (ref 0.0–0.2)

## 2024-04-15 LAB — CMP (CANCER CENTER ONLY)
ALT: 25 U/L (ref 0–44)
AST: 47 U/L — ABNORMAL HIGH (ref 15–41)
Albumin: 2.8 g/dL — ABNORMAL LOW (ref 3.5–5.0)
Alkaline Phosphatase: 334 U/L — ABNORMAL HIGH (ref 38–126)
Anion gap: 3 — ABNORMAL LOW (ref 5–15)
BUN: 14 mg/dL (ref 8–23)
CO2: 25 mmol/L (ref 22–32)
Calcium: 8.5 mg/dL — ABNORMAL LOW (ref 8.9–10.3)
Chloride: 111 mmol/L (ref 98–111)
Creatinine: 0.47 mg/dL (ref 0.44–1.00)
GFR, Estimated: >60 mL/min (ref >60)
Glucose, Bld: 120 mg/dL — ABNORMAL HIGH (ref 70–99)
Potassium: 3.7 mmol/L (ref 3.5–5.1)
Sodium: 139 mmol/L (ref 135–145)
Total Bilirubin: 1.2 mg/dL (ref 0.0–1.2)
Total Protein: 5.9 g/dL — ABNORMAL LOW (ref 6.5–8.1)

## 2024-04-15 MED ORDER — DEXTROSE 5 % IV SOLN
4.6000 mg/kg | Freq: Once | INTRAVENOUS | Status: AC
  Administered 2024-04-15: 400 mg via INTRAVENOUS
  Filled 2024-04-15: qty 20

## 2024-04-15 MED ORDER — FAMOTIDINE IN NACL 20-0.9 MG/50ML-% IV SOLN
20.0000 mg | Freq: Once | INTRAVENOUS | Status: AC | PRN
  Administered 2024-04-15: 20 mg via INTRAVENOUS

## 2024-04-15 MED ORDER — PALONOSETRON HCL INJECTION 0.25 MG/5ML
0.2500 mg | Freq: Once | INTRAVENOUS | Status: AC
  Administered 2024-04-15: 0.25 mg via INTRAVENOUS
  Filled 2024-04-15: qty 5

## 2024-04-15 MED ORDER — DEXAMETHASONE SODIUM PHOSPHATE 10 MG/ML IJ SOLN
10.0000 mg | Freq: Once | INTRAMUSCULAR | Status: AC
  Administered 2024-04-15: 10 mg via INTRAVENOUS
  Filled 2024-04-15: qty 1

## 2024-04-15 MED ORDER — DEXTROSE 5 % IV SOLN: INTRAVENOUS | Status: DC

## 2024-04-15 MED ORDER — METHYLPREDNISOLONE SODIUM SUCC 125 MG IJ SOLR
125.0000 mg | Freq: Once | INTRAMUSCULAR | Status: AC | PRN
  Administered 2024-04-15: 62.5 mg via INTRAVENOUS

## 2024-04-15 MED ORDER — HEPARIN SOD (PORK) LOCK FLUSH 100 UNIT/ML IV SOLN: 500.0000 [IU] | Freq: Once | INTRAVENOUS | Status: DC | PRN

## 2024-04-15 MED ORDER — SODIUM CHLORIDE 0.9 % IV SOLN
150.0000 mg | Freq: Once | INTRAVENOUS | Status: AC
Start: 2024-04-15 — End: 2024-04-15
  Administered 2024-04-15: 150 mg via INTRAVENOUS
  Filled 2024-04-15: qty 150

## 2024-04-15 MED ORDER — SODIUM CHLORIDE 0.9% FLUSH
10.0000 mL | Freq: Once | INTRAVENOUS | Status: AC
Start: 2024-04-15 — End: 2024-04-15
  Administered 2024-04-15: 10 mL

## 2024-04-15 MED ORDER — DIPHENHYDRAMINE HCL 25 MG PO CAPS
25.0000 mg | ORAL_CAPSULE | Freq: Once | ORAL | Status: AC
  Administered 2024-04-15: 25 mg via ORAL
  Filled 2024-04-15: qty 1

## 2024-04-15 MED ORDER — SODIUM CHLORIDE 0.9% FLUSH: 10.0000 mL | INTRAVENOUS | Status: DC | PRN

## 2024-04-15 MED ORDER — ACETAMINOPHEN 325 MG PO TABS
650.0000 mg | ORAL_TABLET | Freq: Once | ORAL | Status: AC
Start: 2024-04-15 — End: 2024-04-15
  Administered 2024-04-15: 650 mg via ORAL
  Filled 2024-04-15: qty 2

## 2024-04-15 NOTE — Progress Notes (Deleted)
 We have Datroway 500mg  in stock.   Golda Zavalza, PharmD, MBA

## 2024-04-15 NOTE — Progress Notes (Signed)
 Patient Care Team: Francenia Ingle, NP as PCP - General (Family Medicine) Lauralee Poll, MD as PCP - Cardiology (Cardiology) Colie Dawes, MD as Attending Physician (Radiation Oncology) Debbie Fails, Laura Polio, NP as Nurse Practitioner (Hematology and Oncology) Adalberto Acton, MD as Consulting Physician (General Surgery) Dot Gazella, DPM as Consulting Physician (Podiatry) Celedonio Coil, MD as Consulting Physician (Gastroenterology) Cameron Cea, MD as Attending Physician (Hematology and Oncology)  DIAGNOSIS:  Encounter Diagnosis  Name Primary?   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)   06/21/2022 - 08/03/2022 Chemotherapy   Patient is on Treatment Plan : BREAST METASTATIC fam-trastuzumab deruxtecan-nxki  (Enhertu ) q21d     06/21/2022 - 04/19/2023 Chemotherapy   Patient is on Treatment Plan : BREAST METASTATIC Fam-Trastuzumab Deruxtecan-nxki  (Enhertu ) (5.4) q21d     05/18/2023 - 07/06/2023 Chemotherapy   Patient is on Treatment Plan : BREAST METASTATIC Sacituzumab govitecan -hziy (Trodelvy ) D1,8 q21d     10/16/2023 - 01/05/2024 Chemotherapy   Patient is on Treatment Plan : BREAST METASTATIC Eribulin  D1,8 q21d     01/21/2024 - 03/17/2024 Chemotherapy   Patient is on Treatment Plan : BREAST Liposomal Doxorubicin  (50) q28d     04/14/2024 -  Chemotherapy   Patient is on Treatment Plan : BREAST Datopotamab Deruxtecan-dlnk (Datroway) q21d     Malignant neoplasm of upper-outer quadrant of right breast in female, estrogen receptor positive (HCC)  06/21/2016 Initial Diagnosis   Malignant neoplasm of upper-outer quadrant of right breast in female, estrogen receptor positive (HCC)   06/21/2016 Initial Biopsy   Right  breast biopsy, 10 oclock: IDC, grade 3, ER+(95%), PR+(80%),Ki67 20%, HER-2 negative (ratio 0.67). Right axilla core biopsy: carcinoma, grade 3, ER+(100%), PR+(90%), Ki67 25%, HER-2 negative (ratio 1.13).    07/17/2016 - 11/06/2016 Neo-Adjuvant Chemotherapy   Received 2 cycles of Doxorubicin  and Cyclophosphamide , then transitioned to weekly Paclitaxel  (due to repeated febrile neutropenia) x 7 cycles, stopped early due to peripheral neuropathy, then completed her final 2 cycles of Doxorubicin  and Cyclophosphamide .    12/19/2016 Surgery   Right breast lumpectomy ([facility]): IDC, grade 2, 1.6cm and 0.3cm, margins negative, 3 SLN positive for metastatic carcinoma.     12/26/2016 Surgery   ALND: metastatic carcinoma in one of 20 lymph nodes, and three nodes from previous lumpectomy.  Four positive nodes, consistent with pN2a.   02/07/2017 - 03/21/2017 Radiation Therapy   Adjuvant radiation Lurena Sally): 1) Right breast and nodes - 4 field: 50 Gy in 25 fractions. IM NODES: >95% receive at least 45Gy/52fx. 50Gy to SCLV/PAB @ 2Gy /fraction x 25 fractions. 2) Right breast boost: 10 Gy in 5 fractions   04/2017 -  Anti-estrogen oral therapy   Anastrozole  1mg  daily.  Bone density 03/23/2017 finds T score of -2.6, osteoporosis, plan to start Prolia  following dental clearance Anastrozole  stopped 01/16/18 Exemestane  25 mg daily 02/12/18  On [name], trial randomized to endocrine therapy alone   05/27/2020 Progression   chest CT scan 05/27/2020 shows bulky mediastinal and right hilar lymphadenopathy with right pleural nodules and a small right pleural effusion, no evidence of liver or bone involvement             (a) biopsy of right breast mass 06/02/2020 shows invasive ductal carcinoma, estrogen and progesterone receptor positive, HER-2 not amplified, with  an MIB-1 of 40%   06/17/2020 Treatment Plan Change    fulvestrant  to start 06/17/2020             (a) palbociclib  to start 06/17/2020 at 125 mg daily 21 days on 7 days  off             (b) palbociclib  decreased to 125mg  every other day x 11 doses on 07/23/2020             (c) palbociclib  dose decreased to100 mg daily, 21/7, starting with September cycle             (d) Palbociclib  decreased to 75mg  daily beginning with March cycle due to oral ulcers             (e) palbociclib  discontinued November 2022 with progression   09/14/2021 PET scan   PET scan 09/14/2021 shows progression in liver and bone   10/05/2021 Pathology Results   liver biopsy 10/05/2021 confirms metastatic carcinoma, estrogen receptor strongly positive, progesterone receptor and HER2 negative, with an MIB-1 of 30%.   10/2021 Treatment Plan Change   fulvestrant  continued, Xgeva  added, palbociclib  changed to abemaciclib  November 2022 -Dose Decreased to 50mg  PO BID Daily   03/07/2022 Miscellaneous   [facility] molecular testing: ER positive, ER positive, MTAP: Detected, ESR 1 negative, BRCA 1 and 2 negative, MSI stable, PD-L1 negative, PIK 3 CA negative, PR negative,   06/21/2022 - 08/03/2022 Chemotherapy   Patient is on Treatment Plan : BREAST METASTATIC fam-trastuzumab deruxtecan-nxki  (Enhertu ) q21d     06/21/2022 - 04/19/2023 Chemotherapy   Patient is on Treatment Plan : BREAST METASTATIC Fam-Trastuzumab Deruxtecan-nxki  (Enhertu ) (5.4) q21d     01/25/2023 Cancer Staging   Staging form: Breast, AJCC 7th Edition - Pathologic: Stage IV (M1) - Signed by Percival Brace, NP on 01/25/2023   05/18/2023 - 07/06/2023 Chemotherapy   Patient is on Treatment Plan : BREAST METASTATIC Sacituzumab govitecan -hziy (Trodelvy ) D1,8 q21d     07/23/2023 -  Chemotherapy   Xeloda  1000 mg bid 14 days on and 7 days off   08/27/2023 Procedure   Y90 hepatic radio embolization   10/16/2023 - 01/05/2024 Chemotherapy   Patient is on Treatment Plan : BREAST METASTATIC Eribulin  D1,8 q21d     01/21/2024 - 03/17/2024 Chemotherapy   Patient is on Treatment Plan : BREAST Liposomal Doxorubicin  (50) q28d     04/14/2024 -   Chemotherapy   Patient is on Treatment Plan : BREAST Datopotamab Deruxtecan-dlnk (Datroway) q21d     Breast cancer, stage 4 (HCC)  03/09/2022 Initial Diagnosis   Breast cancer, stage 4 (HCC)   06/21/2022 - 08/03/2022 Chemotherapy   Patient is on Treatment Plan : BREAST METASTATIC fam-trastuzumab deruxtecan-nxki  (Enhertu ) q21d     06/21/2022 - 04/19/2023 Chemotherapy   Patient is on Treatment Plan : BREAST METASTATIC Fam-Trastuzumab Deruxtecan-nxki  (Enhertu ) (5.4) q21d     05/18/2023 - 07/06/2023 Chemotherapy   Patient is on Treatment Plan : BREAST METASTATIC Sacituzumab govitecan -hziy (Trodelvy ) D1,8 q21d     10/16/2023 - 01/05/2024 Chemotherapy   Patient is on Treatment Plan : BREAST METASTATIC Eribulin  D1,8 q21d     01/21/2024 - 03/17/2024 Chemotherapy   Patient is on Treatment Plan : BREAST Liposomal Doxorubicin  (50) q28d     04/14/2024 -  Chemotherapy   Patient is on Treatment Plan : BREAST Datopotamab Deruxtecan-dlnk (Datroway) q21d       CHIEF COMPLIANT: Cycle 1 datopotamab deruxtecan  HISTORY OF PRESENT ILLNESS:   History of Present Illness April Monroe  [name] is a 63 year old female with breast cancer with distant metastases who presents for follow-up regarding her treatment regimen.  She is undergoing treatment with Datopotamab deruxtecan at 400 mg every 21 days. Blood work shows improved white blood cell count and platelet levels, with stable hemoglobin. Liver function tests are normal. Nutritional status is slightly low, indicated by albumin  and protein levels.  She experiences dry eyes, potentially related to her treatment. Carboxymethylcellulose eye drops have been prescribed, and she is scheduled for an eye exam tomorrow.  Oral mucositis is present, likely due to her treatment. A mouthwash and pills similar to Diflucan  have been prescribed, with a 400 mg dose administered today, to be taken every 21 days.     ALLERGIES:  is allergic to eribulin  mesylate, codeine , cymbalta   [duloxetine  hcl], hydrocodone , ultram  [tramadol  hcl], and venlafaxine .  MEDICATIONS:  Current Outpatient Medications  Medication Sig Dispense Refill   buPROPion  (WELLBUTRIN  XL) 150 MG 24 hr tablet Take 1 tablet (150 mg total) by mouth daily. 90 tablet 0   carvedilol  (COREG ) 3.125 MG tablet TAKE 1 TABLET BY MOUTH TWICE A DAY 180 tablet 1   dexamethasone  (DECADRON ) 0.5 MG/5ML solution Swish 10 mL (1 mg) by mouth for 2 minutes, then spit. Repeat 4 times daily. Start on 1st day of datopotamab deruxtecan. 500 mL 4   fluconazole  (DIFLUCAN ) 200 MG tablet Take 1 tablet (200 mg total) by mouth daily. 30 tablet 0   KLOR-CON  M10 10 MEQ tablet TAKE 1 TABLET BY MOUTH EVERY DAY 90 tablet 1   loratadine  (CLARITIN ) 10 MG tablet Take 10 mg by mouth daily.     naproxen sodium (ALEVE) 220 MG tablet Take 440-660 mg by mouth 2 (two) times daily as needed (pain).     omeprazole  (PRILOSEC) 20 MG capsule Take 20 mg by mouth daily.     prochlorperazine  (COMPAZINE ) 10 MG tablet Take 1 tablet (10 mg total) by mouth every 6 (six) hours as needed for nausea or vomiting. 30 tablet 1   valACYclovir  (VALTREX ) 1000 MG tablet Take 1 tablet (1,000 mg total) by mouth 2 (two) times daily. 180 tablet 4   Carboxymethylcell-Glycerin  PF 0.5-0.9 % SOLN Place 2 drops into both eyes 4 (four) times daily. And as needed. (Patient not taking: Reported on 04/15/2024) 1 each 11   cyclobenzaprine  (FLEXERIL ) 5 MG tablet Take 1 tablet (5 mg total) by mouth 3 (three) times daily as needed for muscle spasms. (Patient not taking: Reported on 04/15/2024) 30 tablet 0   Denosumab  (XGEVA  [place]) Inject 120 mg into the skin every 3 (three) months. (Patient not taking: Reported on 04/15/2024)     dexamethasone  (DECADRON ) 4 MG tablet Take 2 tablets (8 mg) by mouth daily for 3 days starting the day after chemotherapy. Take with food. (Patient not taking: Reported on 04/15/2024) 30 tablet 1   lidocaine -prilocaine  (EMLA ) cream Apply to affected area once (Patient not  taking: Reported on 04/15/2024) 30 g 3   magic mouthwash (nystatin , lidocaine , diphenhydrAMINE , alum & mag hydroxide) suspension Take 5 mLs by mouth 4 (four) times daily as needed for mouth pain. Suspension contains equal amounts of Maalox Extra Strength, nystatin , diphenhydramine  and lidocaine . (Patient not taking: Reported on 04/15/2024) 240 mL 2   ondansetron  (ZOFRAN ) 8 MG tablet Take 1 tablet (8 mg total) by mouth every 8 (eight) hours as needed for nausea or vomiting. Start on the third day after chemotherapy. (Patient not taking: Reported on 04/15/2024) 30 tablet 1   rizatriptan  (MAXALT ) 5  MG tablet Take 1 tablet (5 mg total) by mouth as needed for migraine. May repeat in 2 hours if needed (Patient not taking: Reported on 04/15/2024) 10 tablet 0   No current facility-administered medications for this visit.    PHYSICAL EXAMINATION: ECOG PERFORMANCE STATUS: 1 - Symptomatic but completely ambulatory  Vitals:   04/15/24 1000  BP: 118/78  Pulse: 74  Resp: 18  Temp: 97.9 F (36.6 C)  SpO2: 99%   Filed Weights   04/15/24 1000  Weight: 192 lb (87.1 kg)    LABORATORY DATA:  I have reviewed the data as listed    Latest Ref Rng & Units 04/04/2024    9:10 AM 03/25/2024    9:25 AM 03/17/2024    1:39 PM  CMP  Glucose 70 - 99 mg/dL 161  096  045   BUN 8 - 23 mg/dL 11  13  11    Creatinine 0.44 - 1.00 mg/dL 4.09  8.11  9.14   Sodium 135 - 145 mmol/L 139  137  138   Potassium 3.5 - 5.1 mmol/L 3.8  4.2  3.7   Chloride 98 - 111 mmol/L 108  107  110   CO2 22 - 32 mmol/L 27  26  25    Calcium 8.9 - 10.3 mg/dL 8.9  8.7  8.3   Total Protein 6.5 - 8.1 g/dL 6.1  6.3  5.9   Total Bilirubin 0.0 - 1.2 mg/dL 1.7  1.8  1.5   Alkaline Phos 38 - 126 U/L 323  321  319   AST 15 - 41 U/L 49  49  47   ALT 0 - 44 U/L 29  27  25      Lab Results  Component Value Date   WBC 2.4 (L) 04/15/2024   HGB 10.0 (L) 04/15/2024   HCT 28.8 (L) 04/15/2024   MCV 111.6 (H) 04/15/2024   PLT 122 (L) 04/15/2024    NEUTROABS PENDING 04/15/2024    ASSESSMENT & PLAN:  Malignant neoplasm of upper-outer quadrant of right breast in female, 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  (ER +95%, PR +95%, HER2 negative null score 0, MSI: Stable, TMB 5, genomic LOH: High   CT CAP: Liver mets 2.9 cm (was 1.7 cm), 2.5 cm (was 2.7 cm) others stable, enl sub pleural nodule 0.8 cm, increase in Left supraclav LN and pe vascular LN), stable bone mets  -------------------------------------------------------- Current treatment: Datopotamab deruxtecan cycle 1 day 1 Echocardiogram 04/09/2024: EF 60 to 65%  Patient had an eye exam to make sure she can safely receive Datroway Return to clinic in 1 week for toxicity check      Assessment & Plan Breast cancer with distant metastases Undergoing treatment with Datopotamab deruxtecan. Blood work shows improved white blood cell count and platelets, with well-managed hemoglobin. Liver function tests are adequate, and bilirubin levels are normal. Nutritional status is slightly low. Neupogen  shots will be considered based on blood count trends. - Administer Datopotamab deruxtecan 400 mg IV every 21 days. - Monitor blood counts and liver function  tests regularly. - Assess need for Neupogen  shots based on blood count trends. - Provide anti-nausea medication preemptively.  Oral mucositis Experiencing oral mucositis, likely as a side effect of cancer  treatment. - Use prescribed mouthwash and accompanying pills for oral mucositis management.  Dry eyes Experiences dry eyes, possibly related to ongoing cancer treatment. - Use carboxymethylcellulose eye drops as needed for dry eyes.  Follow-up Requires close monitoring to assess treatment response and manage side effects. - Schedule follow-up appointment next week to evaluate treatment response and side effects.      No orders of the defined types were placed in this encounter.  The patient has a good understanding of the overall plan. she agrees with it. she will call with any problems that may develop before the next visit here. Total time spent: 30 mins including face to face time and time spent for planning, charting and co-ordination of care   Viinay K Braylin Formby, MD 04/15/24

## 2024-04-15 NOTE — Progress Notes (Signed)
    DATE:  04/15/24                                        X CHEMO/IMMUNOTHERAPY REACTION           MD: Lee Public   AGENT/BLOOD PRODUCT RECEIVING TODAY:              datopotamab    AGENT/BLOOD PRODUCT RECEIVING IMMEDIATELY PRIOR TO REACTION:          datopotamab     Vitals:   04/15/24 1437 04/15/24 1457  BP: 128/69 130/77  Pulse: 79 95  Resp: 16 16  SpO2: 99% 100%      REACTION(S):           cough and post nasal drip   PREMEDS:      Decadron  10 mg IV, Benadryl  25 mg PO, Emend  150 mg IV, Aloxi  0.25 mg IV   INTERVENTION: Pepcid  20 mg IV, solu-medrol  62 mg IV   Review of Systems  Review of Systems  HENT:  Positive for postnasal drip.   Respiratory:  Positive for cough.   All other systems reviewed and are negative.    Physical Exam  Physical Exam Vitals and nursing note reviewed.  Constitutional:      Appearance: She is not ill-appearing or toxic-appearing.  HENT:     Head: Normocephalic.  Eyes:     Conjunctiva/sclera: Conjunctivae normal.  Cardiovascular:     Rate and Rhythm: Normal rate and regular rhythm.     Pulses: Normal pulses.     Heart sounds: Normal heart sounds.  Pulmonary:     Effort: Pulmonary effort is normal.     Breath sounds: Normal breath sounds.  Abdominal:     General: There is no distension.  Musculoskeletal:     Cervical back: Normal range of motion.  Skin:    General: Skin is warm and dry.  Neurological:     Mental Status: She is alert.     OUTCOME:                  Patient became symptomatic 20 minutes after infusion. Emergency medications were administered as documented above. Patient returned to baseline. Oncologist notified and plans to add pepcid  and cetirizine  to future treatment.

## 2024-04-15 NOTE — Progress Notes (Signed)
 Proceed with Datroway 400mg  today d/t counts per Dr. Gudena.  Dalina Samara, PharmD, MBA

## 2024-04-15 NOTE — Patient Instructions (Addendum)
 [facility] - A DEPT OF [facility]  Discharge Instructions: Thank you for choosing [facility] to provide your oncology and hematology care.   If you have a lab appointment with the Cancer Center, please go directly to the Cancer Center and check in at the registration area.   Wear comfortable clothing and clothing appropriate for easy access to any Portacath or PICC line.   We strive to give you quality time with your provider. You may need to reschedule your appointment if you arrive late (15 or more minutes).  Arriving late affects you and other patients whose appointments are after yours.  Also, if you miss three or more appointments without notifying the office, you may be dismissed from the clinic at the provider's discretion.      For prescription refill requests, have your pharmacy contact our office and allow 72 hours for refills to be completed.    Today you received the following chemotherapy and/or immunotherapy agents: datopotamab deruxtecan (Datroway)      To help prevent nausea and vomiting after your treatment, we encourage you to take your nausea medication as directed.  BELOW ARE SYMPTOMS THAT SHOULD BE REPORTED IMMEDIATELY: *FEVER GREATER THAN 100.4 F (38 C) OR HIGHER *CHILLS OR SWEATING *NAUSEA AND VOMITING THAT IS NOT CONTROLLED WITH YOUR NAUSEA MEDICATION *UNUSUAL SHORTNESS OF BREATH *UNUSUAL BRUISING OR BLEEDING *URINARY PROBLEMS (pain or burning when urinating, or frequent urination) *BOWEL PROBLEMS (unusual diarrhea, constipation, pain near the anus) TENDERNESS IN MOUTH AND THROAT WITH OR WITHOUT PRESENCE OF ULCERS (sore throat, sores in mouth, or a toothache) UNUSUAL RASH, SWELLING OR PAIN  UNUSUAL VAGINAL DISCHARGE OR ITCHING   Items with * indicate a potential emergency and should be followed up as soon as possible or go to the Emergency Department if any problems should occur.  Please show the CHEMOTHERAPY  ALERT CARD or IMMUNOTHERAPY ALERT CARD at check-in to the Emergency Department and triage nurse.  Should you have questions after your visit or need to cancel or reschedule your appointment, please contact [facility] - A DEPT OF [facility]  Dept: [phone]  and follow the prompts.  Office hours are 8:00 a.m. to 4:30 p.m. Monday - Friday. Please note that voicemails left after 4:00 p.m. may not be returned until the following business day.  We are closed weekends and major holidays. You have access to a nurse at all times for urgent questions. Please call the main number to the clinic Dept: [phone] and follow the prompts.   For any non-urgent questions, you may also contact your provider using MyChart. We now offer e-Visits for anyone 59 and older to request care online for non-urgent symptoms. For details visit [email or web address].   Also download the MyChart app! Go to the app store, search "MyChart", open the app, select [facility], and log in with your MyChart username and password.

## 2024-04-15 NOTE — Progress Notes (Signed)
 Hypersensitivity Reaction note  Date of event: 04/15/24 Time of event: 1440 Generic name of drug involved: datopotamab deruxtecan Name of provider notified of the hypersensitivity reaction: Angeline Kemps PA-C Was agent that likely caused hypersensitivity reaction added to Allergies List within EMR? YES Chain of events including reaction signs/symptoms, treatment administered, and outcome (e.g., drug resumed; drug discontinued; sent to Emergency Department; etc.): Patient finished infusion of first time datopotamab deruxtecan. Within a few minutes of completion patient developed slight cough, with clearing of throat, which continued for several minutes. Pt with no other complaints, but coughing persisted. Angeline Kemps, PA notified and assessed patient at chairside. Pepcid  20mg  and Solumedrol 62.5mg  given per order. Patient's vitals remained stable. Symptoms resolved after administration of emergency medications.   Ernestene Headings, RN 04/15/2024 3:03 PM

## 2024-04-15 NOTE — Assessment & Plan Note (Signed)
 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  (ER +95%, PR +95%, HER2 negative null score 0, MSI: Stable, TMB 5, genomic LOH: High   CT CAP: Liver mets 2.9 cm (was 1.7 cm), 2.5 cm (was 2.7 cm) others stable, enl sub pleural nodule 0.8 cm, increase in Left supraclav LN and pe vascular LN), stable bone mets  -------------------------------------------------------- Current treatment: Datopotamab deruxtecan cycle 1 day 1 Echocardiogram 04/09/2024: EF 60 to 65%  Patient had an eye exam to make sure she can safely receive Datroway Return to clinic in 1 week for toxicity check

## 2024-04-16 ENCOUNTER — Telehealth: Payer: Self-pay

## 2024-04-16 ENCOUNTER — Telehealth: Payer: Self-pay | Admitting: Hematology and Oncology

## 2024-04-16 DIAGNOSIS — H5213 Myopia, bilateral: Secondary | ICD-10-CM | POA: Diagnosis not present

## 2024-04-16 LAB — CANCER ANTIGEN 27.29: CA 27.29: 201.9 U/mL — ABNORMAL HIGH (ref 0.0–38.6)

## 2024-04-16 NOTE — Telephone Encounter (Signed)
 Left vm about scheduled appt date and time.

## 2024-04-16 NOTE — Telephone Encounter (Signed)
 Ms Taffe states that she is pretty well.  She is eating, drinking, and urinating well.  The cough returned last evening and she too a Zyertec as instructed prior to discharge from treatment if cough reoccurred. Zyrtec  was effective in stopping cough.  She is feeling achy in legs and has a H/A. She took some naproxen ~30 minutes ago and symptoms are begging to dissipate.  She knows to call the office at [phone] if  she has any questions or concerns.

## 2024-04-16 NOTE — Telephone Encounter (Signed)
-----   Message from Nurse Ladena Picking sent at 04/15/2024  3:29 PM EDT ----- Regarding: 1st Time datopotamab deruxtecan 1st Time datopotamab deruxtecan Mild infusion reaction.

## 2024-04-18 ENCOUNTER — Encounter: Payer: Self-pay | Admitting: Hematology and Oncology

## 2024-04-18 ENCOUNTER — Ambulatory Visit: Payer: Self-pay | Admitting: Student

## 2024-04-22 ENCOUNTER — Inpatient Hospital Stay (HOSPITAL_BASED_OUTPATIENT_CLINIC_OR_DEPARTMENT_OTHER): Admitting: Hematology and Oncology

## 2024-04-22 ENCOUNTER — Inpatient Hospital Stay

## 2024-04-22 ENCOUNTER — Other Ambulatory Visit: Payer: Self-pay | Admitting: *Deleted

## 2024-04-22 VITALS — BP 118/74 | HR 97 | Temp 97.7°F | Resp 18 | Ht 63.0 in | Wt 191.6 lb

## 2024-04-22 DIAGNOSIS — C7951 Secondary malignant neoplasm of bone: Secondary | ICD-10-CM

## 2024-04-22 DIAGNOSIS — T451X5A Adverse effect of antineoplastic and immunosuppressive drugs, initial encounter: Secondary | ICD-10-CM

## 2024-04-22 DIAGNOSIS — Z5111 Encounter for antineoplastic chemotherapy: Secondary | ICD-10-CM | POA: Diagnosis not present

## 2024-04-22 DIAGNOSIS — Z170 Estrogen receptor positive status [ER+]: Secondary | ICD-10-CM | POA: Diagnosis not present

## 2024-04-22 DIAGNOSIS — C50911 Malignant neoplasm of unspecified site of right female breast: Secondary | ICD-10-CM

## 2024-04-22 DIAGNOSIS — E860 Dehydration: Secondary | ICD-10-CM | POA: Diagnosis not present

## 2024-04-22 DIAGNOSIS — C50411 Malignant neoplasm of upper-outer quadrant of right female breast: Secondary | ICD-10-CM

## 2024-04-22 DIAGNOSIS — R110 Nausea: Secondary | ICD-10-CM | POA: Diagnosis not present

## 2024-04-22 DIAGNOSIS — C787 Secondary malignant neoplasm of liver and intrahepatic bile duct: Secondary | ICD-10-CM | POA: Diagnosis not present

## 2024-04-22 LAB — CBC WITH DIFFERENTIAL (CANCER CENTER ONLY)
Abs Immature Granulocytes: 0.01 10*3/uL (ref 0.00–0.07)
Basophils Absolute: 0 10*3/uL (ref 0.0–0.1)
Basophils Relative: 0 %
Eosinophils Absolute: 0 10*3/uL (ref 0.0–0.5)
Eosinophils Relative: 1 %
HCT: 30.7 % — ABNORMAL LOW (ref 36.0–46.0)
Hemoglobin: 10.7 g/dL — ABNORMAL LOW (ref 12.0–15.0)
Immature Granulocytes: 0 %
Lymphocytes Relative: 5 %
Lymphs Abs: 0.1 10*3/uL — ABNORMAL LOW (ref 0.7–4.0)
MCH: 37.9 pg — ABNORMAL HIGH (ref 26.0–34.0)
MCHC: 34.9 g/dL (ref 30.0–36.0)
MCV: 108.9 fL — ABNORMAL HIGH (ref 80.0–100.0)
Monocytes Absolute: 0.3 10*3/uL (ref 0.1–1.0)
Monocytes Relative: 10 %
Neutro Abs: 2.3 10*3/uL (ref 1.7–7.7)
Neutrophils Relative %: 84 %
Platelet Count: 73 10*3/uL — ABNORMAL LOW (ref 150–400)
RBC: 2.82 MIL/uL — ABNORMAL LOW (ref 3.87–5.11)
RDW: 22 % — ABNORMAL HIGH (ref 11.5–15.5)
WBC Count: 2.7 10*3/uL — ABNORMAL LOW (ref 4.0–10.5)
nRBC: 0.7 % — ABNORMAL HIGH (ref 0.0–0.2)

## 2024-04-22 LAB — CMP (CANCER CENTER ONLY)
ALT: 84 U/L — ABNORMAL HIGH (ref 0–44)
AST: 81 U/L — ABNORMAL HIGH (ref 15–41)
Albumin: 2.9 g/dL — ABNORMAL LOW (ref 3.5–5.0)
Alkaline Phosphatase: 357 U/L — ABNORMAL HIGH (ref 38–126)
Anion gap: 4 — ABNORMAL LOW (ref 5–15)
BUN: 18 mg/dL (ref 8–23)
CO2: 30 mmol/L (ref 22–32)
Calcium: 8.5 mg/dL — ABNORMAL LOW (ref 8.9–10.3)
Chloride: 103 mmol/L (ref 98–111)
Creatinine: 0.59 mg/dL (ref 0.44–1.00)
GFR, Estimated: >60 mL/min (ref >60)
Glucose, Bld: 106 mg/dL — ABNORMAL HIGH (ref 70–99)
Potassium: 3.4 mmol/L — ABNORMAL LOW (ref 3.5–5.1)
Sodium: 137 mmol/L (ref 135–145)
Total Bilirubin: 2.3 mg/dL — ABNORMAL HIGH (ref 0.0–1.2)
Total Protein: 5.7 g/dL — ABNORMAL LOW (ref 6.5–8.1)

## 2024-04-22 MED ORDER — SODIUM CHLORIDE 0.9% FLUSH
10.0000 mL | Freq: Once | INTRAVENOUS | Status: AC
  Administered 2024-04-22: 10 mL

## 2024-04-22 MED ORDER — HEPARIN SOD (PORK) LOCK FLUSH 100 UNIT/ML IV SOLN
500.0000 [IU] | Freq: Once | INTRAVENOUS | Status: AC
  Administered 2024-04-22: 500 [IU]

## 2024-04-22 MED ORDER — SODIUM CHLORIDE 0.9 % IV SOLN: INTRAVENOUS | Status: DC

## 2024-04-22 NOTE — Progress Notes (Signed)
 Patient Care Team: Francenia Ingle, NP as PCP - General (Family Medicine) Lauralee Poll, MD as PCP - Cardiology (Cardiology) Colie Dawes, MD as Attending Physician (Radiation Oncology) Debbie Fails, Laura Polio, NP as Nurse Practitioner (Hematology and Oncology) Adalberto Acton, MD as Consulting Physician (General Surgery) Dot Gazella, DPM as Consulting Physician (Podiatry) Celedonio Coil, MD as Consulting Physician (Gastroenterology) Cameron Cea, MD as Attending Physician (Hematology and Oncology)  DIAGNOSIS:  Encounter Diagnosis  Name Primary?   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)   06/21/2022 - 08/03/2022 Chemotherapy   Patient is on Treatment Plan : BREAST METASTATIC fam-trastuzumab deruxtecan-nxki  (Enhertu ) q21d     06/21/2022 - 04/19/2023 Chemotherapy   Patient is on Treatment Plan : BREAST METASTATIC Fam-Trastuzumab Deruxtecan-nxki  (Enhertu ) (5.4) q21d     05/18/2023 - 07/06/2023 Chemotherapy   Patient is on Treatment Plan : BREAST METASTATIC Sacituzumab govitecan -hziy (Trodelvy ) D1,8 q21d     10/16/2023 - 01/05/2024 Chemotherapy   Patient is on Treatment Plan : BREAST METASTATIC Eribulin  D1,8 q21d     01/21/2024 - 03/17/2024 Chemotherapy   Patient is on Treatment Plan : BREAST Liposomal Doxorubicin  (50) q28d     04/15/2024 -  Chemotherapy   Patient is on Treatment Plan : BREAST Datopotamab Deruxtecan-dlnk (Datroway) q21d     Malignant neoplasm of upper-outer quadrant of right breast in female, estrogen receptor positive (HCC)  06/21/2016 Initial Diagnosis   Malignant neoplasm of upper-outer quadrant of right breast in female, estrogen receptor positive (HCC)   06/21/2016 Initial Biopsy   Right  breast biopsy, 10 oclock: IDC, grade 3, ER+(95%), PR+(80%),Ki67 20%, HER-2 negative (ratio 0.67). Right axilla core biopsy: carcinoma, grade 3, ER+(100%), PR+(90%), Ki67 25%, HER-2 negative (ratio 1.13).    07/17/2016 - 11/06/2016 Neo-Adjuvant Chemotherapy   Received 2 cycles of Doxorubicin  and Cyclophosphamide , then transitioned to weekly Paclitaxel  (due to repeated febrile neutropenia) x 7 cycles, stopped early due to peripheral neuropathy, then completed her final 2 cycles of Doxorubicin  and Cyclophosphamide .    12/19/2016 Surgery   Right breast lumpectomy ([facility]): IDC, grade 2, 1.6cm and 0.3cm, margins negative, 3 SLN positive for metastatic carcinoma.     12/26/2016 Surgery   ALND: metastatic carcinoma in one of 20 lymph nodes, and three nodes from previous lumpectomy.  Four positive nodes, consistent with pN2a.   02/07/2017 - 03/21/2017 Radiation Therapy   Adjuvant radiation Lurena Sally): 1) Right breast and nodes - 4 field: 50 Gy in 25 fractions. IM NODES: >95% receive at least 45Gy/7fx. 50Gy to SCLV/PAB @ 2Gy /fraction x 25 fractions. 2) Right breast boost: 10 Gy in 5 fractions   04/2017 -  Anti-estrogen oral therapy   Anastrozole  1mg  daily.  Bone density 03/23/2017 finds T score of -2.6, osteoporosis, plan to start Prolia  following dental clearance Anastrozole  stopped 01/16/18 Exemestane  25 mg daily 02/12/18  On [name], trial randomized to endocrine therapy alone   05/27/2020 Progression   chest CT scan 05/27/2020 shows bulky mediastinal and right hilar lymphadenopathy with right pleural nodules and a small right pleural effusion, no evidence of liver or bone involvement             (a) biopsy of right breast mass 06/02/2020 shows invasive ductal carcinoma, estrogen and progesterone receptor positive, HER-2 not amplified,  with an MIB-1 of 40%   06/17/2020 Treatment Plan Change    fulvestrant  to start 06/17/2020             (a) palbociclib  to start 06/17/2020 at 125 mg daily 21 days on 7 days  off             (b) palbociclib  decreased to 125mg  every other day x 11 doses on 07/23/2020             (c) palbociclib  dose decreased to100 mg daily, 21/7, starting with September cycle             (d) Palbociclib  decreased to 75mg  daily beginning with March cycle due to oral ulcers             (e) palbociclib  discontinued November 2022 with progression   09/14/2021 PET scan   PET scan 09/14/2021 shows progression in liver and bone   10/05/2021 Pathology Results   liver biopsy 10/05/2021 confirms metastatic carcinoma, estrogen receptor strongly positive, progesterone receptor and HER2 negative, with an MIB-1 of 30%.   10/2021 Treatment Plan Change   fulvestrant  continued, Xgeva  added, palbociclib  changed to abemaciclib  November 2022 -Dose Decreased to 50mg  PO BID Daily   03/07/2022 Miscellaneous   [facility] molecular testing: ER positive, ER positive, MTAP: Detected, ESR 1 negative, BRCA 1 and 2 negative, MSI stable, PD-L1 negative, PIK 3 CA negative, PR negative,   06/21/2022 - 08/03/2022 Chemotherapy   Patient is on Treatment Plan : BREAST METASTATIC fam-trastuzumab deruxtecan-nxki  (Enhertu ) q21d     06/21/2022 - 04/19/2023 Chemotherapy   Patient is on Treatment Plan : BREAST METASTATIC Fam-Trastuzumab Deruxtecan-nxki  (Enhertu ) (5.4) q21d     01/25/2023 Cancer Staging   Staging form: Breast, AJCC 7th Edition - Pathologic: Stage IV (M1) - Signed by Percival Brace, NP on 01/25/2023   05/18/2023 - 07/06/2023 Chemotherapy   Patient is on Treatment Plan : BREAST METASTATIC Sacituzumab govitecan -hziy (Trodelvy ) D1,8 q21d     07/23/2023 -  Chemotherapy   Xeloda  1000 mg bid 14 days on and 7 days off   08/27/2023 Procedure   Y90 hepatic radio embolization   10/16/2023 - 01/05/2024 Chemotherapy   Patient is on Treatment Plan : BREAST METASTATIC Eribulin  D1,8 q21d     01/21/2024 - 03/17/2024 Chemotherapy   Patient is on Treatment Plan : BREAST Liposomal Doxorubicin  (50) q28d     04/15/2024 -   Chemotherapy   Patient is on Treatment Plan : BREAST Datopotamab Deruxtecan-dlnk (Datroway) q21d     Breast cancer, stage 4 (HCC)  03/09/2022 Initial Diagnosis   Breast cancer, stage 4 (HCC)   06/21/2022 - 08/03/2022 Chemotherapy   Patient is on Treatment Plan : BREAST METASTATIC fam-trastuzumab deruxtecan-nxki  (Enhertu ) q21d     06/21/2022 - 04/19/2023 Chemotherapy   Patient is on Treatment Plan : BREAST METASTATIC Fam-Trastuzumab Deruxtecan-nxki  (Enhertu ) (5.4) q21d     05/18/2023 - 07/06/2023 Chemotherapy   Patient is on Treatment Plan : BREAST METASTATIC Sacituzumab govitecan -hziy (Trodelvy ) D1,8 q21d     10/16/2023 - 01/05/2024 Chemotherapy   Patient is on Treatment Plan : BREAST METASTATIC Eribulin  D1,8 q21d     01/21/2024 - 03/17/2024 Chemotherapy   Patient is on Treatment Plan : BREAST Liposomal Doxorubicin  (50) q28d     04/15/2024 -  Chemotherapy   Patient is on Treatment Plan : BREAST Datopotamab Deruxtecan-dlnk (Datroway) q21d       CHIEF COMPLIANT: Cycle 1 day 8 Detrol LA  HISTORY OF PRESENT ILLNESS:  History of Present Illness  April [name] is a 63 year old female undergoing chemotherapy who presents with shakiness and fatigue.  She experiences significant shakiness for the past four days, affecting daily activities, and attributes this to Decadron  taken post-treatment. She feels jittery and extremely tired. Daily nausea and vomiting are present since starting chemotherapy, but she maintains regular eating habits, substituting an Ensure shake for breakfast today. Her weight remains stable at 191 pounds. Mouth sores with redness are present, with pain improving today after using mouthwash. Breast changes include loosening and reduced hardness, with nodules present but less itchy. Weight loss has affected bra fit.     ALLERGIES:  is allergic to datopotamab deruxtecan, eribulin  mesylate, codeine , cymbalta  [duloxetine  hcl], hydrocodone , ultram  [tramadol  hcl], and  venlafaxine .  MEDICATIONS:  Current Outpatient Medications  Medication Sig Dispense Refill   buPROPion  (WELLBUTRIN  XL) 150 MG 24 hr tablet Take 1 tablet (150 mg total) by mouth daily. 90 tablet 0   carvedilol  (COREG ) 3.125 MG tablet TAKE 1 TABLET BY MOUTH TWICE A DAY 180 tablet 1   cromolyn (OPTICROM) 4 % ophthalmic solution SMARTSIG:In Eye(s)     dexamethasone  (DECADRON ) 0.5 MG/5ML solution Swish 10 mL (1 mg) by mouth for 2 minutes, then spit. Repeat 4 times daily. Start on 1st day of datopotamab deruxtecan. 500 mL 4   fluconazole  (DIFLUCAN ) 200 MG tablet Take 1 tablet (200 mg total) by mouth daily. 30 tablet 0   KLOR-CON  M10 10 MEQ tablet TAKE 1 TABLET BY MOUTH EVERY DAY 90 tablet 1   loratadine  (CLARITIN ) 10 MG tablet Take 10 mg by mouth daily.     naproxen sodium (ALEVE) 220 MG tablet Take 440-660 mg by mouth 2 (two) times daily as needed (pain).     omeprazole  (PRILOSEC) 20 MG capsule Take 20 mg by mouth daily.     prochlorperazine  (COMPAZINE ) 10 MG tablet Take 1 tablet (10 mg total) by mouth every 6 (six) hours as needed for nausea or vomiting. 30 tablet 1   valACYclovir  (VALTREX ) 1000 MG tablet Take 1 tablet (1,000 mg total) by mouth 2 (two) times daily. 180 tablet 4   XIIDRA 5 % SOLN      Carboxymethylcell-Glycerin  PF 0.5-0.9 % SOLN Place 2 drops into both eyes 4 (four) times daily. And as needed. (Patient not taking: Reported on 04/22/2024) 1 each 11   cyclobenzaprine  (FLEXERIL ) 5 MG tablet Take 1 tablet (5 mg total) by mouth 3 (three) times daily as needed for muscle spasms. (Patient not taking: Reported on 04/22/2024) 30 tablet 0   Denosumab  (XGEVA  ) Inject 120 mg into the skin every 3 (three) months. (Patient not taking: Reported on 04/15/2024)     dexamethasone  (DECADRON ) 4 MG tablet Take 2 tablets (8 mg) by mouth daily for 3 days starting the day after chemotherapy. Take with food. (Patient not taking: Reported on 04/22/2024) 30 tablet 1   furosemide  (LASIX ) 40 MG tablet Take 40 mg  by mouth 2 (two) times daily. (Patient not taking: Reported on 04/22/2024)     lidocaine -prilocaine  (EMLA ) cream Apply to affected area once (Patient not taking: Reported on 04/22/2024) 30 g 3   magic mouthwash (nystatin , lidocaine , diphenhydrAMINE , alum & mag hydroxide) suspension Take 5 mLs by mouth 4 (four) times daily as needed for mouth pain. Suspension contains equal amounts of Maalox Extra Strength, nystatin , diphenhydramine  and lidocaine . (Patient not taking: Reported on 04/22/2024) 240 mL 2   ondansetron  (ZOFRAN ) 8 MG tablet Take 1 tablet (8 mg total) by mouth every 8 (eight)  hours as needed for nausea or vomiting. Start on the third day after chemotherapy. (Patient not taking: Reported on 04/22/2024) 30 tablet 1   rizatriptan  (MAXALT ) 5 MG tablet Take 1 tablet (5 mg total) by mouth as needed for migraine. May repeat in 2 hours if needed (Patient not taking: Reported on 04/22/2024) 10 tablet 0   No current facility-administered medications for this visit.    PHYSICAL EXAMINATION: ECOG PERFORMANCE STATUS: 1 - Symptomatic but completely ambulatory  Vitals:   04/22/24 0948  BP: 118/74  Pulse: 97  Resp: 18  Temp: 97.7 F (36.5 C)  SpO2: 98%   Filed Weights   04/22/24 0948  Weight: 191 lb 9.6 oz (86.9 kg)      LABORATORY DATA:  I have reviewed the data as listed    Latest Ref Rng & Units 04/15/2024   10:16 AM 04/04/2024    9:10 AM 03/25/2024    9:25 AM  CMP  Glucose 70 - 99 mg/dL 161  096  045   BUN 8 - 23 mg/dL 14  11  13    Creatinine 0.44 - 1.00 mg/dL 4.09  8.11  9.14   Sodium 135 - 145 mmol/L 139  139  137   Potassium 3.5 - 5.1 mmol/L 3.7  3.8  4.2   Chloride 98 - 111 mmol/L 111  108  107   CO2 22 - 32 mmol/L 25  27  26    Calcium 8.9 - 10.3 mg/dL 8.5  8.9  8.7   Total Protein 6.5 - 8.1 g/dL 5.9  6.1  6.3   Total Bilirubin 0.0 - 1.2 mg/dL 1.2  1.7  1.8   Alkaline Phos 38 - 126 U/L 334  323  321   AST 15 - 41 U/L 47  49  49   ALT 0 - 44 U/L 25  29  27      Lab Results   Component Value Date   WBC 2.4 (L) 04/15/2024   HGB 10.0 (L) 04/15/2024   HCT 28.8 (L) 04/15/2024   MCV 111.6 (H) 04/15/2024   PLT 122 (L) 04/15/2024   NEUTROABS 1.7 04/15/2024    ASSESSMENT & PLAN:  Malignant neoplasm of upper-outer quadrant of right breast in female, 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  (ER +95%, PR +95%, HER2 negative null score 0, MSI: Stable, TMB 5, genomic LOH: High   CT CAP: Liver mets 2.9 cm (was 1.7 cm), 2.5 cm (was 2.7 cm) others stable, enl sub pleural nodule 0.8 cm, increase in Left supraclav LN and pe vascular LN), stable bone mets  -------------------------------------------------------- Current treatment: Datopotamab deruxtecan cycle 1 day 8 Echocardiogram 04/09/2024: EF 60 to 65%  Patient had an eye exam to make sure she can safely receive Datroway Toxicities: Jitteriness: Possibly due to Decadron .  For the next cycle she will take the oral Decadron  Nausea: Alternating Compazine  and Zofran  which is helping Cytopenias: WBC count and hemoglobin are better.  Platelet count relatively stable.  We will keep the dosage the same for the next dose. Dehydration:  Will try and give her 500 cc normal saline today. Oral mucositis: Mild and improved because of the steroid mouthwash Profound fatigue  Return to clinic in 2 weeks for cycle 2     No orders of the defined types were placed in this encounter.  The patient has a  good understanding of the overall plan. she agrees with it. she will call with any problems that may develop before the next visit here. Total time spent: 30 mins including face to face time and time spent for planning, charting and co-ordination of care   Viinay K Rasean Joos, MD 04/22/24

## 2024-04-22 NOTE — Assessment & Plan Note (Signed)
 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  (ER +95%, PR +95%, HER2 negative null score 0, MSI: Stable, TMB 5, genomic LOH: High   CT CAP: Liver mets 2.9 cm (was 1.7 cm), 2.5 cm (was 2.7 cm) others stable, enl sub pleural nodule 0.8 cm, increase in Left supraclav LN and pe vascular LN), stable bone mets  -------------------------------------------------------- Current treatment: Datopotamab deruxtecan cycle 1 day 8 Echocardiogram 04/09/2024: EF 60 to 65%  Patient had an eye exam to make sure she can safely receive Datroway Toxicities:  Return to clinic in 2 weeks for cycle 2

## 2024-04-22 NOTE — Patient Instructions (Signed)

## 2024-04-23 ENCOUNTER — Encounter: Payer: Self-pay | Admitting: Hematology and Oncology

## 2024-04-23 LAB — CANCER ANTIGEN 27.29: CA 27.29: 236 U/mL — ABNORMAL HIGH (ref 0.0–38.6)

## 2024-04-30 ENCOUNTER — Other Ambulatory Visit: Payer: Self-pay | Admitting: *Deleted

## 2024-04-30 ENCOUNTER — Encounter: Payer: Self-pay | Admitting: Hematology and Oncology

## 2024-04-30 DIAGNOSIS — Z170 Estrogen receptor positive status [ER+]: Secondary | ICD-10-CM

## 2024-04-30 MED ORDER — FLUCONAZOLE 200 MG PO TABS: 200.0000 mg | ORAL_TABLET | Freq: Every day | ORAL | 0 refills | Status: DC

## 2024-05-01 ENCOUNTER — Other Ambulatory Visit: Payer: Self-pay | Admitting: *Deleted

## 2024-05-01 ENCOUNTER — Encounter: Payer: Self-pay | Admitting: Hematology and Oncology

## 2024-05-01 ENCOUNTER — Ambulatory Visit (HOSPITAL_COMMUNITY)
Admission: RE | Admit: 2024-05-01 | Discharge: 2024-05-01 | Disposition: A | Discharge status: Home / Self Care | Source: Ambulatory Visit | Attending: Hematology and Oncology | Admitting: Hematology and Oncology

## 2024-05-01 DIAGNOSIS — Z170 Estrogen receptor positive status [ER+]: Secondary | ICD-10-CM | POA: Insufficient documentation

## 2024-05-01 DIAGNOSIS — R071 Chest pain on breathing: Secondary | ICD-10-CM | POA: Insufficient documentation

## 2024-05-01 DIAGNOSIS — C50411 Malignant neoplasm of upper-outer quadrant of right female breast: Secondary | ICD-10-CM | POA: Insufficient documentation

## 2024-05-01 MED ORDER — IOHEXOL 350 MG/ML SOLN
75.0000 mL | Freq: Once | INTRAVENOUS | Status: AC | PRN
  Administered 2024-05-01: 75 mL via INTRAVENOUS

## 2024-05-01 MED ORDER — LEVOFLOXACIN 750 MG PO TABS: 750.0000 mg | ORAL_TABLET | Freq: Every day | ORAL | 0 refills | Status: DC

## 2024-05-01 MED ORDER — HEPARIN SOD (PORK) LOCK FLUSH 100 UNIT/ML IV SOLN
500.0000 [IU] | Freq: Once | INTRAVENOUS | Status: AC
  Administered 2024-05-01: 500 [IU] via INTRAVENOUS

## 2024-05-01 MED ORDER — HEPARIN SOD (PORK) LOCK FLUSH 100 UNIT/ML IV SOLN
INTRAVENOUS | Status: AC
Start: 2024-05-01 — End: ?
  Filled 2024-05-01: qty 5

## 2024-05-01 MED ORDER — SODIUM CHLORIDE (PF) 0.9 % IJ SOLN
INTRAMUSCULAR | Status: AC
  Filled 2024-05-01: qty 50

## 2024-05-01 NOTE — Progress Notes (Signed)
 RN reviewed CT angio with MD which was negative for PE.  Verbal orders received from MD for pt to be prescribed Levaquin  750 mg p.o daily x 7 days.  Prescription sent to pharmacy on file, pt educated and verbalized understanding.

## 2024-05-01 NOTE — Progress Notes (Signed)
 Received mychart message from pt with complaint of right sided chest discomfort and difficulty breathing.  RN reviewed with MD and verbal orders received to obtain STAT CT Angio to r/o PE. Orders placed, appt scheduled, pt notified and verbalized understanding.

## 2024-05-02 ENCOUNTER — Encounter (HOSPITAL_COMMUNITY): Payer: Self-pay | Admitting: Internal Medicine

## 2024-05-02 ENCOUNTER — Ambulatory Visit (HOSPITAL_COMMUNITY)
Admission: RE | Admit: 2024-05-02 | Discharge: 2024-05-02 | Disposition: A | Discharge status: Home / Self Care | Source: Ambulatory Visit | Attending: Internal Medicine | Admitting: Internal Medicine

## 2024-05-02 VITALS — BP 100/68 | HR 114 | Wt 186.8 lb

## 2024-05-02 DIAGNOSIS — R000 Tachycardia, unspecified: Secondary | ICD-10-CM | POA: Diagnosis not present

## 2024-05-02 DIAGNOSIS — Z853 Personal history of malignant neoplasm of breast: Secondary | ICD-10-CM | POA: Diagnosis not present

## 2024-05-02 DIAGNOSIS — C50911 Malignant neoplasm of unspecified site of right female breast: Secondary | ICD-10-CM

## 2024-05-02 DIAGNOSIS — Z8249 Family history of ischemic heart disease and other diseases of the circulatory system: Secondary | ICD-10-CM | POA: Insufficient documentation

## 2024-05-02 DIAGNOSIS — Z5986 Financial insecurity: Secondary | ICD-10-CM | POA: Insufficient documentation

## 2024-05-02 DIAGNOSIS — Z9221 Personal history of antineoplastic chemotherapy: Secondary | ICD-10-CM | POA: Diagnosis not present
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
## 2024-05-02 NOTE — Patient Instructions (Signed)
 There has been no changes to your medications.  Your physician has requested that you have an echocardiogram. Echocardiography is a painless test that uses sound waves to create images of your heart. It provides your doctor with information about the size and shape of your heart and how well your heart's chambers and valves are working. This procedure takes approximately one hour. There are no restrictions for this procedure. Please do NOT wear cologne, perfume, aftershave, or lotions (deodorant is allowed). Please arrive 15 minutes prior to your appointment time.  Please note: We ask at that you not bring children with you during ultrasound (echo/ vascular) testing. Due to room size and safety concerns, children are not allowed in the ultrasound rooms during exams. Our front office staff cannot provide observation of children in our lobby area while testing is being conducted. An adult accompanying a patient to their appointment will only be allowed in the ultrasound room at the discretion of the ultrasound technician under special circumstances. We apologize for any inconvenience.  Your physician recommends that you schedule a follow-up appointment in: 6 months with an echocardiogram (November) ** PLEASE CALL THE OFFICE IN [place] TO ARRANGE YOUR FOLLOW UP APPOINTMENT.**  If you have any questions or concerns before your next appointment please send us  a message through [place] or call our office at [phone].    TO LEAVE A MESSAGE FOR THE NURSE SELECT OPTION 2, PLEASE LEAVE A MESSAGE INCLUDING: YOUR NAME DATE OF BIRTH CALL BACK NUMBER REASON FOR CALL**this is important as we prioritize the call backs  YOU WILL RECEIVE A CALL BACK THE SAME DAY AS LONG AS YOU CALL BEFORE 4:00 PM  At the [facility], you and your health needs are our priority. As part of our continuing mission to provide you with exceptional heart care, we have created designated Provider Care Teams. These  Care Teams include your primary Cardiologist (physician) and Advanced Practice Providers (APPs- Physician Assistants and Nurse Practitioners) who all work together to provide you with the care you need, when you need it.   You may see any of the following providers on your designated Care Team at your next follow up: Dr Jules Oar Dr Peder Bourdon Dr. Alwin Baars Dr. Arta Lark Amy Marijane Shoulders, NP Ruddy Corral, [place] [facility], [place] Dennise Fitz, NP [place] Lee, NP Shawnee Dellen, NP Luster Salters, PharmD Bevely Brush, PharmD   Please be sure to bring in all your medications bottles to every appointment.    Thank you for choosing [facility]

## 2024-05-05 ENCOUNTER — Inpatient Hospital Stay (HOSPITAL_BASED_OUTPATIENT_CLINIC_OR_DEPARTMENT_OTHER): Attending: Oncology | Admitting: Hematology and Oncology

## 2024-05-05 ENCOUNTER — Inpatient Hospital Stay: Attending: Oncology

## 2024-05-05 VITALS — BP 101/64 | HR 100 | Temp 98.3°F | Resp 18 | Ht 63.0 in | Wt 186.5 lb

## 2024-05-05 DIAGNOSIS — E860 Dehydration: Secondary | ICD-10-CM | POA: Insufficient documentation

## 2024-05-05 DIAGNOSIS — K1379 Other lesions of oral mucosa: Secondary | ICD-10-CM | POA: Insufficient documentation

## 2024-05-05 DIAGNOSIS — Z170 Estrogen receptor positive status [ER+]: Secondary | ICD-10-CM | POA: Insufficient documentation

## 2024-05-05 DIAGNOSIS — Z5111 Encounter for antineoplastic chemotherapy: Secondary | ICD-10-CM | POA: Insufficient documentation

## 2024-05-05 DIAGNOSIS — Z923 Personal history of irradiation: Secondary | ICD-10-CM | POA: Insufficient documentation

## 2024-05-05 DIAGNOSIS — I890 Lymphedema, not elsewhere classified: Secondary | ICD-10-CM | POA: Diagnosis not present

## 2024-05-05 DIAGNOSIS — M810 Age-related osteoporosis without current pathological fracture: Secondary | ICD-10-CM | POA: Diagnosis not present

## 2024-05-05 DIAGNOSIS — C50411 Malignant neoplasm of upper-outer quadrant of right female breast: Secondary | ICD-10-CM | POA: Insufficient documentation

## 2024-05-05 DIAGNOSIS — C7951 Secondary malignant neoplasm of bone: Secondary | ICD-10-CM | POA: Diagnosis not present

## 2024-05-05 DIAGNOSIS — Z7952 Long term (current) use of systemic steroids: Secondary | ICD-10-CM | POA: Diagnosis not present

## 2024-05-05 DIAGNOSIS — J90 Pleural effusion, not elsewhere classified: Secondary | ICD-10-CM | POA: Diagnosis not present

## 2024-05-05 DIAGNOSIS — K1231 Oral mucositis (ulcerative) due to antineoplastic therapy: Secondary | ICD-10-CM | POA: Diagnosis not present

## 2024-05-05 DIAGNOSIS — L819 Disorder of pigmentation, unspecified: Secondary | ICD-10-CM | POA: Insufficient documentation

## 2024-05-05 DIAGNOSIS — R110 Nausea: Secondary | ICD-10-CM | POA: Insufficient documentation

## 2024-05-05 DIAGNOSIS — C50919 Malignant neoplasm of unspecified site of unspecified female breast: Secondary | ICD-10-CM

## 2024-05-05 DIAGNOSIS — Z79811 Long term (current) use of aromatase inhibitors: Secondary | ICD-10-CM | POA: Diagnosis not present

## 2024-05-05 DIAGNOSIS — C773 Secondary and unspecified malignant neoplasm of axilla and upper limb lymph nodes: Secondary | ICD-10-CM | POA: Insufficient documentation

## 2024-05-05 DIAGNOSIS — Z79899 Other long term (current) drug therapy: Secondary | ICD-10-CM | POA: Insufficient documentation

## 2024-05-05 DIAGNOSIS — R634 Abnormal weight loss: Secondary | ICD-10-CM | POA: Diagnosis not present

## 2024-05-05 DIAGNOSIS — Z79624 Long term (current) use of inhibitors of nucleotide synthesis: Secondary | ICD-10-CM | POA: Insufficient documentation

## 2024-05-05 DIAGNOSIS — T451X5A Adverse effect of antineoplastic and immunosuppressive drugs, initial encounter: Secondary | ICD-10-CM | POA: Insufficient documentation

## 2024-05-05 DIAGNOSIS — C50911 Malignant neoplasm of unspecified site of right female breast: Secondary | ICD-10-CM

## 2024-05-05 LAB — CBC WITH DIFFERENTIAL (CANCER CENTER ONLY)
Abs Immature Granulocytes: 0.01 10*3/uL (ref 0.00–0.07)
Basophils Absolute: 0 10*3/uL (ref 0.0–0.1)
Basophils Relative: 1 %
Eosinophils Absolute: 0.1 10*3/uL (ref 0.0–0.5)
Eosinophils Relative: 7 %
HCT: 30.8 % — ABNORMAL LOW (ref 36.0–46.0)
Hemoglobin: 10.8 g/dL — ABNORMAL LOW (ref 12.0–15.0)
Immature Granulocytes: 1 %
Lymphocytes Relative: 8 %
Lymphs Abs: 0.2 10*3/uL — ABNORMAL LOW (ref 0.7–4.0)
MCH: 39.4 pg — ABNORMAL HIGH (ref 26.0–34.0)
MCHC: 35.1 g/dL (ref 30.0–36.0)
MCV: 112.4 fL — ABNORMAL HIGH (ref 80.0–100.0)
Monocytes Absolute: 0.3 10*3/uL (ref 0.1–1.0)
Monocytes Relative: 14 %
Neutro Abs: 1.4 10*3/uL — ABNORMAL LOW (ref 1.7–7.7)
Neutrophils Relative %: 69 %
Platelet Count: 104 10*3/uL — ABNORMAL LOW (ref 150–400)
RBC: 2.74 MIL/uL — ABNORMAL LOW (ref 3.87–5.11)
RDW: 19.3 % — ABNORMAL HIGH (ref 11.5–15.5)
WBC Count: 2 10*3/uL — ABNORMAL LOW (ref 4.0–10.5)
nRBC: 0 % (ref 0.0–0.2)

## 2024-05-05 LAB — CMP (CANCER CENTER ONLY)
ALT: 29 U/L (ref 0–44)
AST: 41 U/L (ref 15–41)
Albumin: 2.8 g/dL — ABNORMAL LOW (ref 3.5–5.0)
Alkaline Phosphatase: 349 U/L — ABNORMAL HIGH (ref 38–126)
Anion gap: 6 (ref 5–15)
BUN: 9 mg/dL (ref 8–23)
CO2: 27 mmol/L (ref 22–32)
Calcium: 8.4 mg/dL — ABNORMAL LOW (ref 8.9–10.3)
Chloride: 105 mmol/L (ref 98–111)
Creatinine: 0.49 mg/dL (ref 0.44–1.00)
GFR, Estimated: >60 mL/min (ref >60)
Glucose, Bld: 98 mg/dL (ref 70–99)
Potassium: 3.1 mmol/L — ABNORMAL LOW (ref 3.5–5.1)
Sodium: 138 mmol/L (ref 135–145)
Total Bilirubin: 1.6 mg/dL — ABNORMAL HIGH (ref 0.0–1.2)
Total Protein: 5.7 g/dL — ABNORMAL LOW (ref 6.5–8.1)

## 2024-05-05 MED ORDER — SODIUM CHLORIDE 0.9% FLUSH
10.0000 mL | Freq: Once | INTRAVENOUS | Status: AC
  Administered 2024-05-05: 10 mL

## 2024-05-05 MED ORDER — HEPARIN SOD (PORK) LOCK FLUSH 100 UNIT/ML IV SOLN
500.0000 [IU] | Freq: Once | INTRAVENOUS | Status: AC
  Administered 2024-05-05: 500 [IU]

## 2024-05-05 NOTE — Progress Notes (Signed)
 Patient Care Team: Francenia Ingle, NP as PCP - General (Family Medicine) Lauralee Poll, MD as PCP - Cardiology (Cardiology) Colie Dawes, MD as Attending Physician (Radiation Oncology) Debbie Fails, Laura Polio, NP as Nurse Practitioner (Hematology and Oncology) Adalberto Acton, MD as Consulting Physician (General Surgery) Dot Gazella, DPM as Consulting Physician (Podiatry) Celedonio Coil, MD as Consulting Physician (Gastroenterology) Cameron Cea, MD as Attending Physician (Hematology and Oncology)  DIAGNOSIS:  Encounter Diagnosis  Name Primary?   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)   06/21/2022 - 08/03/2022 Chemotherapy   Patient is on Treatment Plan : BREAST METASTATIC fam-trastuzumab deruxtecan-nxki  (Enhertu ) q21d     06/21/2022 - 04/19/2023 Chemotherapy   Patient is on Treatment Plan : BREAST METASTATIC Fam-Trastuzumab Deruxtecan-nxki  (Enhertu ) (5.4) q21d     05/18/2023 - 07/06/2023 Chemotherapy   Patient is on Treatment Plan : BREAST METASTATIC Sacituzumab govitecan -hziy (Trodelvy ) D1,8 q21d     10/16/2023 - 01/05/2024 Chemotherapy   Patient is on Treatment Plan : BREAST METASTATIC Eribulin  D1,8 q21d     01/21/2024 - 03/17/2024 Chemotherapy   Patient is on Treatment Plan : BREAST Liposomal Doxorubicin  (50) q28d     04/15/2024 -  Chemotherapy   Patient is on Treatment Plan : BREAST Datopotamab Deruxtecan-dlnk (Datroway) q21d     Malignant neoplasm of upper-outer quadrant of right breast in female, estrogen receptor positive (HCC)  06/21/2016 Initial Diagnosis   Malignant neoplasm of upper-outer quadrant of right breast in female, estrogen receptor positive (HCC)   06/21/2016 Initial Biopsy   Right  breast biopsy, 10 oclock: IDC, grade 3, ER+(95%), PR+(80%),Ki67 20%, HER-2 negative (ratio 0.67). Right axilla core biopsy: carcinoma, grade 3, ER+(100%), PR+(90%), Ki67 25%, HER-2 negative (ratio 1.13).    07/17/2016 - 11/06/2016 Neo-Adjuvant Chemotherapy   Received 2 cycles of Doxorubicin  and Cyclophosphamide , then transitioned to weekly Paclitaxel  (due to repeated febrile neutropenia) x 7 cycles, stopped early due to peripheral neuropathy, then completed her final 2 cycles of Doxorubicin  and Cyclophosphamide .    12/19/2016 Surgery   Right breast lumpectomy ([facility]): IDC, grade 2, 1.6cm and 0.3cm, margins negative, 3 SLN positive for metastatic carcinoma.     12/26/2016 Surgery   ALND: metastatic carcinoma in one of 20 lymph nodes, and three nodes from previous lumpectomy.  Four positive nodes, consistent with pN2a.   02/07/2017 - 03/21/2017 Radiation Therapy   Adjuvant radiation Lurena Sally): 1) Right breast and nodes - 4 field: 50 Gy in 25 fractions. IM NODES: >95% receive at least 45Gy/63fx. 50Gy to SCLV/PAB @ 2Gy /fraction x 25 fractions. 2) Right breast boost: 10 Gy in 5 fractions   04/2017 -  Anti-estrogen oral therapy   Anastrozole  1mg  daily.  Bone density 03/23/2017 finds T score of -2.6, osteoporosis, plan to start Prolia  following dental clearance Anastrozole  stopped 01/16/18 Exemestane  25 mg daily 02/12/18  On [name], trial randomized to endocrine therapy alone   05/27/2020 Progression   chest CT scan 05/27/2020 shows bulky mediastinal and right hilar lymphadenopathy with right pleural nodules and a small right pleural effusion, no evidence of liver or bone involvement             (a) biopsy of right breast mass 06/02/2020 shows invasive ductal carcinoma, estrogen and progesterone receptor positive, HER-2 not amplified,  with an MIB-1 of 40%   06/17/2020 Treatment Plan Change    fulvestrant  to start 06/17/2020             (a) palbociclib  to start 06/17/2020 at 125 mg daily 21 days on 7 days  off             (b) palbociclib  decreased to 125mg  every other day x 11 doses on 07/23/2020             (c) palbociclib  dose decreased to100 mg daily, 21/7, starting with September cycle             (d) Palbociclib  decreased to 75mg  daily beginning with March cycle due to oral ulcers             (e) palbociclib  discontinued November 2022 with progression   09/14/2021 PET scan   PET scan 09/14/2021 shows progression in liver and bone   10/05/2021 Pathology Results   liver biopsy 10/05/2021 confirms metastatic carcinoma, estrogen receptor strongly positive, progesterone receptor and HER2 negative, with an MIB-1 of 30%.   10/2021 Treatment Plan Change   fulvestrant  continued, Xgeva  added, palbociclib  changed to abemaciclib  November 2022 -Dose Decreased to 50mg  PO BID Daily   03/07/2022 Miscellaneous   [facility] molecular testing: ER positive, ER positive, MTAP: Detected, ESR 1 negative, BRCA 1 and 2 negative, MSI stable, PD-L1 negative, PIK 3 CA negative, PR negative,   06/21/2022 - 08/03/2022 Chemotherapy   Patient is on Treatment Plan : BREAST METASTATIC fam-trastuzumab deruxtecan-nxki  (Enhertu ) q21d     06/21/2022 - 04/19/2023 Chemotherapy   Patient is on Treatment Plan : BREAST METASTATIC Fam-Trastuzumab Deruxtecan-nxki  (Enhertu ) (5.4) q21d     01/25/2023 Cancer Staging   Staging form: Breast, AJCC 7th Edition - Pathologic: Stage IV (M1) - Signed by Percival Brace, NP on 01/25/2023   05/18/2023 - 07/06/2023 Chemotherapy   Patient is on Treatment Plan : BREAST METASTATIC Sacituzumab govitecan -hziy (Trodelvy ) D1,8 q21d     07/23/2023 -  Chemotherapy   Xeloda  1000 mg bid 14 days on and 7 days off   08/27/2023 Procedure   Y90 hepatic radio embolization   10/16/2023 - 01/05/2024 Chemotherapy   Patient is on Treatment Plan : BREAST METASTATIC Eribulin  D1,8 q21d     01/21/2024 - 03/17/2024 Chemotherapy   Patient is on Treatment Plan : BREAST Liposomal Doxorubicin  (50) q28d     04/15/2024 -   Chemotherapy   Patient is on Treatment Plan : BREAST Datopotamab Deruxtecan-dlnk (Datroway) q21d     Breast cancer, stage 4 (HCC)  03/09/2022 Initial Diagnosis   Breast cancer, stage 4 (HCC)   06/21/2022 - 08/03/2022 Chemotherapy   Patient is on Treatment Plan : BREAST METASTATIC fam-trastuzumab deruxtecan-nxki  (Enhertu ) q21d     06/21/2022 - 04/19/2023 Chemotherapy   Patient is on Treatment Plan : BREAST METASTATIC Fam-Trastuzumab Deruxtecan-nxki  (Enhertu ) (5.4) q21d     05/18/2023 - 07/06/2023 Chemotherapy   Patient is on Treatment Plan : BREAST METASTATIC Sacituzumab govitecan -hziy (Trodelvy ) D1,8 q21d     10/16/2023 - 01/05/2024 Chemotherapy   Patient is on Treatment Plan : BREAST METASTATIC Eribulin  D1,8 q21d     01/21/2024 - 03/17/2024 Chemotherapy   Patient is on Treatment Plan : BREAST Liposomal Doxorubicin  (50) q28d     04/15/2024 -  Chemotherapy   Patient is on Treatment Plan : BREAST Datopotamab Deruxtecan-dlnk (Datroway) q21d       CHIEF COMPLIANT: Datroway cycle 2 tomorrow  HISTORY OF PRESENT ILLNESS:   History of Present Illness April [name] is a 63 year old female with breast cancer who presents for follow-up of her cancer treatment and related symptoms.  She has a persistent sore on her tongue with increased sensitivity to spicy foods. A prescribed mouthwash provides relief. A lump in the breast was recently discovered, with the breast feeling warm. She has fibrocystic breast changes. A 2 cm nodule in the right lung remains stable. Significant weight loss has occurred, with a focus on maintaining muscle mass through exercise. Lymphedema is managed with a new compression sleeve, effectively reducing swelling. Skin darkening is noted, attributed to chemotherapy, with a naturally darker skin tone due to her [place] heritage.     ALLERGIES:  is allergic to datopotamab deruxtecan, eribulin  mesylate, codeine , cymbalta  [duloxetine  hcl], hydrocodone , ultram  [tramadol  hcl], and  venlafaxine .  MEDICATIONS:  Current Outpatient Medications  Medication Sig Dispense Refill   buPROPion  (WELLBUTRIN  XL) 150 MG 24 hr tablet Take 1 tablet (150 mg total) by mouth daily. 90 tablet 0   Carboxymethylcell-Glycerin  PF 0.5-0.9 % SOLN Place 2 drops into both eyes 4 (four) times daily. And as needed. 1 each 11   cromolyn (OPTICROM) 4 % ophthalmic solution SMARTSIG:In Eye(s)     dexamethasone  (DECADRON ) 0.5 MG/5ML solution Swish 10 mL (1 mg) by mouth for 2 minutes, then spit. Repeat 4 times daily. Start on 1st day of datopotamab deruxtecan. 500 mL 4   fluconazole  (DIFLUCAN ) 200 MG tablet Take 1 tablet (200 mg total) by mouth daily. 30 tablet 0   KLOR-CON  M10 10 MEQ tablet TAKE 1 TABLET BY MOUTH EVERY DAY 90 tablet 1   levofloxacin  (LEVAQUIN ) 750 MG tablet Take 750 mg by mouth daily. X 7 days     loratadine  (CLARITIN ) 10 MG tablet Take 10 mg by mouth daily.     magic mouthwash (nystatin , lidocaine , diphenhydrAMINE , alum & mag hydroxide) suspension Take 5 mLs by mouth 4 (four) times daily as needed for mouth pain. Suspension contains equal amounts of Maalox Extra Strength, nystatin , diphenhydramine  and lidocaine . 240 mL 2   omeprazole  (PRILOSEC) 20 MG capsule Take 20 mg by mouth daily.     XIIDRA 5 % SOLN      cyclobenzaprine  (FLEXERIL ) 5 MG tablet Take 1 tablet (5 mg total) by mouth 3 (three) times daily as needed for muscle spasms. (Patient not taking: Reported on 05/05/2024) 30 tablet 0   Denosumab  (XGEVA  [place]) Inject 120 mg into the skin every 3 (three) months. (Patient not taking: Reported on 05/05/2024)     dexamethasone  (DECADRON ) 4 MG tablet Take 2 tablets (8 mg) by mouth daily for 3 days starting the day after chemotherapy. Take with food. (Patient not taking: Reported on 05/05/2024) 30 tablet 1   furosemide  (LASIX ) 40 MG tablet Take 40 mg by mouth 2 (two) times daily. (Patient not taking: Reported on 04/22/2024)     naproxen sodium (ALEVE) 220 MG tablet Take 440-660 mg by mouth 2 (two) times  daily as needed (pain). (Patient not taking: Reported on 05/05/2024)     ondansetron  (ZOFRAN ) 8 MG tablet Take 1 tablet (8 mg total) by mouth every 8 (eight) hours as needed for nausea or vomiting. Start on the third day after chemotherapy. (Patient not taking: Reported on 05/05/2024) 30 tablet 1   prochlorperazine  (COMPAZINE ) 10 MG tablet Take 1 tablet (10 mg total) by mouth every 6 (six) hours as needed for nausea or vomiting. (Patient not taking: Reported on 05/05/2024) 30 tablet 1   rizatriptan  (MAXALT ) 5 MG tablet Take 1  tablet (5 mg total) by mouth as needed for migraine. May repeat in 2 hours if needed (Patient not taking: Reported on 05/05/2024) 10 tablet 0   valACYclovir  (VALTREX ) 1000 MG tablet Take 1 tablet (1,000 mg total) by mouth 2 (two) times daily. (Patient not taking: Reported on 05/05/2024) 180 tablet 4   No current facility-administered medications for this visit.    PHYSICAL EXAMINATION: ECOG PERFORMANCE STATUS: 1 - Symptomatic but completely ambulatory  Vitals:   05/05/24 0943  BP: 101/64  Pulse: 100  Resp: 18  Temp: 98.3 F (36.8 C)  SpO2: 99%   Filed Weights   05/05/24 0943  Weight: 186 lb 8 oz (84.6 kg)    Physical Exam   (exam performed in the presence of a chaperone)  LABORATORY DATA:  I have reviewed the data as listed    Latest Ref Rng & Units 04/22/2024    9:22 AM 04/15/2024   10:16 AM 04/04/2024    9:10 AM  CMP  Glucose 70 - 99 mg/dL 161  096  045   BUN 8 - 23 mg/dL 18  14  11    Creatinine 0.44 - 1.00 mg/dL 4.09  8.11  9.14   Sodium 135 - 145 mmol/L 137  139  139   Potassium 3.5 - 5.1 mmol/L 3.4  3.7  3.8   Chloride 98 - 111 mmol/L 103  111  108   CO2 22 - 32 mmol/L 30  25  27    Calcium 8.9 - 10.3 mg/dL 8.5  8.5  8.9   Total Protein 6.5 - 8.1 g/dL 5.7  5.9  6.1   Total Bilirubin 0.0 - 1.2 mg/dL 2.3  1.2  1.7   Alkaline Phos 38 - 126 U/L 357  334  323   AST 15 - 41 U/L 81  47  49   ALT 0 - 44 U/L 84  25  29     Lab Results  Component Value Date    WBC 2.0 (L) 05/05/2024   HGB 10.8 (L) 05/05/2024   HCT 30.8 (L) 05/05/2024   MCV 112.4 (H) 05/05/2024   PLT 104 (L) 05/05/2024   NEUTROABS 1.4 (L) 05/05/2024    ASSESSMENT & PLAN:  Malignant neoplasm of upper-outer quadrant of right breast in female, 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  (ER +95%, PR +95%, HER2 negative null score 0, MSI: Stable, TMB 5, genomic LOH: High   CT CAP: Liver mets 2.9 cm (was 1.7 cm), 2.5 cm (was 2.7 cm) others stable, enl sub pleural nodule 0.8 cm, increase in Left supraclav LN and pe vascular LN), stable bone mets  -------------------------------------------------------- Current treatment: Datopotamab deruxtecan cycle 2 is tomorrow Echocardiogram 04/09/2024: EF 60 to 65%  Patient had an eye exam to make sure she can safely receive Datroway Toxicities: Jitteriness: Possibly due to Decadron .  Oral Decadron  discontinued Nausea: Alternating Compazine  and Zofran  which is helping Cytopenias: We will administer a dose of Granix  prior to each of her treatments. Dehydration: Much improved after the break in treatment Oral mucositis:  Mild and improved because of the steroid mouthwash.  There is a sore on the tongue that is not improving. Profound fatigue   Return to clinic in 3 weeks for cycle 3 ------------------------------------- Assessment and Plan Assessment & Plan Metastatic breast cancer with bone and liver  metastasis Metastatic breast cancer stable on current treatment. CT shows stable subpleural nodule and bone lesions. Increased pulmonary fluid and inflammation. New left breast mass likely fluid or fibrocystic changes, not on CT. Surgery not indicated unless symptomatic. - Continue current chemotherapy regimen. - Monitor breast mass for changes or symptoms. - Schedule next chemotherapy cycle on May 26, 2024. - Administer booster shot on May 24, 2024, to support blood counts.  Mucositis due to chemotherapy Mucositis secondary to chemotherapy, improved with mouthwash. - Continue using prescribed mouthwash for symptom relief.  Darkening of skin due to chemotherapy Generalized skin hyperpigmentation due to chemotherapy.  Lymphedema Lymphedema managed with compression sleeve, reducing swelling. - Continue using the new compression sleeve as needed.      No orders of the defined types were placed in this encounter.  The patient has a good understanding of the overall plan. she agrees with it. she will call with any problems that may develop before the next visit here. Total time spent: 30 mins including face to face time and time spent for planning, charting and co-ordination of care   Margert Sheerer, MD 05/05/24

## 2024-05-05 NOTE — Assessment & Plan Note (Signed)
 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  (ER +95%, PR +95%, HER2 negative null score 0, MSI: Stable, TMB 5, genomic LOH: High   CT CAP: Liver mets 2.9 cm (was 1.7 cm), 2.5 cm (was 2.7 cm) others stable, enl sub pleural nodule 0.8 cm, increase in Left supraclav LN and pe vascular LN), stable bone mets  -------------------------------------------------------- Current treatment: Datopotamab deruxtecan cycle 2 Echocardiogram 04/09/2024: EF 60 to 65%  Patient had an eye exam to make sure she can safely receive Datroway Toxicities: Jitteriness: Possibly due to Decadron .  Oral Decadron  discontinued Nausea: Alternating Compazine  and Zofran  which is helping Cytopenias: WBC count and hemoglobin are better.  Platelet count relatively stable.  We will keep the dosage the same for the next dose. Dehydration: Will try and give her 500 cc normal saline today. Oral mucositis: Mild and improved because of the steroid mouthwash Profound fatigue   Return to clinic in 1 week for cycle 2-day 8

## 2024-05-06 ENCOUNTER — Inpatient Hospital Stay: Admitting: Dietician

## 2024-05-06 ENCOUNTER — Inpatient Hospital Stay

## 2024-05-06 VITALS — BP 116/64 | HR 88 | Temp 98.7°F | Resp 20 | Wt 185.4 lb

## 2024-05-06 DIAGNOSIS — Z170 Estrogen receptor positive status [ER+]: Secondary | ICD-10-CM

## 2024-05-06 DIAGNOSIS — C50911 Malignant neoplasm of unspecified site of right female breast: Secondary | ICD-10-CM

## 2024-05-06 DIAGNOSIS — C50919 Malignant neoplasm of unspecified site of unspecified female breast: Secondary | ICD-10-CM

## 2024-05-06 DIAGNOSIS — Z5111 Encounter for antineoplastic chemotherapy: Secondary | ICD-10-CM | POA: Diagnosis not present

## 2024-05-06 LAB — CANCER ANTIGEN 27.29: CA 27.29: 236.7 U/mL — ABNORMAL HIGH (ref 0.0–38.6)

## 2024-05-06 MED ORDER — HEPARIN SOD (PORK) LOCK FLUSH 100 UNIT/ML IV SOLN
500.0000 [IU] | Freq: Once | INTRAVENOUS | Status: AC | PRN
  Administered 2024-05-06: 500 [IU]

## 2024-05-06 MED ORDER — DEXTROSE 5 % IV SOLN: INTRAVENOUS | Status: DC

## 2024-05-06 MED ORDER — ACETAMINOPHEN 325 MG PO TABS
650.0000 mg | ORAL_TABLET | Freq: Once | ORAL | Status: AC
  Administered 2024-05-06: 650 mg via ORAL
  Filled 2024-05-06: qty 2

## 2024-05-06 MED ORDER — CETIRIZINE HCL 10 MG/ML IV SOLN
10.0000 mg | Freq: Once | INTRAVENOUS | Status: AC
  Administered 2024-05-06: 10 mg via INTRAVENOUS
  Filled 2024-05-06: qty 1

## 2024-05-06 MED ORDER — DEXAMETHASONE SODIUM PHOSPHATE 10 MG/ML IJ SOLN
10.0000 mg | Freq: Once | INTRAMUSCULAR | Status: AC
  Administered 2024-05-06: 10 mg via INTRAVENOUS
  Filled 2024-05-06: qty 1

## 2024-05-06 MED ORDER — FAMOTIDINE IN NACL 20-0.9 MG/50ML-% IV SOLN
20.0000 mg | Freq: Once | INTRAVENOUS | Status: AC
  Administered 2024-05-06: 20 mg via INTRAVENOUS
  Filled 2024-05-06: qty 50

## 2024-05-06 MED ORDER — DIPHENHYDRAMINE HCL 25 MG PO CAPS
25.0000 mg | ORAL_CAPSULE | Freq: Once | ORAL | Status: AC
  Administered 2024-05-06: 25 mg via ORAL
  Filled 2024-05-06: qty 1

## 2024-05-06 MED ORDER — DEXTROSE 5 % IV SOLN
4.6000 mg/kg | Freq: Once | INTRAVENOUS | Status: AC
  Administered 2024-05-06: 400 mg via INTRAVENOUS
  Filled 2024-05-06: qty 20

## 2024-05-06 MED ORDER — SODIUM CHLORIDE 0.9% FLUSH
10.0000 mL | INTRAVENOUS | Status: DC | PRN
  Administered 2024-05-06: 10 mL

## 2024-05-06 MED ORDER — PALONOSETRON HCL INJECTION 0.25 MG/5ML
0.2500 mg | Freq: Once | INTRAVENOUS | Status: AC
  Administered 2024-05-06: 0.25 mg via INTRAVENOUS
  Filled 2024-05-06: qty 5

## 2024-05-06 MED ORDER — SODIUM CHLORIDE 0.9 % IV SOLN
150.0000 mg | Freq: Once | INTRAVENOUS | Status: AC
  Administered 2024-05-06: 150 mg via INTRAVENOUS
  Filled 2024-05-06: qty 150

## 2024-05-06 NOTE — Progress Notes (Signed)
 Reduce today's and all subsequent doses of Datroway to 4.6mg /kg per Dr. Lee Public.  Jadelyn Elks, PharmD, MBA

## 2024-05-06 NOTE — Progress Notes (Signed)
 Pt observed for 60 minutes post second Datroway infusion. Pt tolerated Tx well w/out incident. VSS at discharge.  Ambulatory to lobby.

## 2024-05-06 NOTE — Patient Instructions (Signed)
 [facility] - A DEPT OF [facility]  Discharge Instructions: Thank you for choosing [facility] to provide your oncology and hematology care.   If you have a lab appointment with the Cancer Center, please go directly to the Cancer Center and check in at the registration area.   Wear comfortable clothing and clothing appropriate for easy access to any Portacath or PICC line.   We strive to give you quality time with your provider. You may need to reschedule your appointment if you arrive late (15 or more minutes).  Arriving late affects you and other patients whose appointments are after yours.  Also, if you miss three or more appointments without notifying the office, you may be dismissed from the clinic at the provider's discretion.      For prescription refill requests, have your pharmacy contact our office and allow 72 hours for refills to be completed.    Today you received the following chemotherapy and/or immunotherapy agents: Datroway      To help prevent nausea and vomiting after your treatment, we encourage you to take your nausea medication as directed.  BELOW ARE SYMPTOMS THAT SHOULD BE REPORTED IMMEDIATELY: *FEVER GREATER THAN 100.4 F (38 C) OR HIGHER *CHILLS OR SWEATING *NAUSEA AND VOMITING THAT IS NOT CONTROLLED WITH YOUR NAUSEA MEDICATION *UNUSUAL SHORTNESS OF BREATH *UNUSUAL BRUISING OR BLEEDING *URINARY PROBLEMS (pain or burning when urinating, or frequent urination) *BOWEL PROBLEMS (unusual diarrhea, constipation, pain near the anus) TENDERNESS IN MOUTH AND THROAT WITH OR WITHOUT PRESENCE OF ULCERS (sore throat, sores in mouth, or a toothache) UNUSUAL RASH, SWELLING OR PAIN  UNUSUAL VAGINAL DISCHARGE OR ITCHING   Items with * indicate a potential emergency and should be followed up as soon as possible or go to the Emergency Department if any problems should occur.  Please show the CHEMOTHERAPY ALERT CARD or IMMUNOTHERAPY  ALERT CARD at check-in to the Emergency Department and triage nurse.  Should you have questions after your visit or need to cancel or reschedule your appointment, please contact [facility] - A DEPT OF [facility]  Dept: [phone]  and follow the prompts.  Office hours are 8:00 a.m. to 4:30 p.m. Monday - Friday. Please note that voicemails left after 4:00 p.m. may not be returned until the following business day.  We are closed weekends and major holidays. You have access to a nurse at all times for urgent questions. Please call the main number to the clinic Dept: [phone] and follow the prompts.   For any non-urgent questions, you may also contact your provider using MyChart. We now offer e-Visits for anyone 67 and older to request care online for non-urgent symptoms. For details visit [email or web address].   Also download the MyChart app! Go to the app store, search "MyChart", open the app, select [place], and log in with your MyChart username and password.

## 2024-05-06 NOTE — Progress Notes (Signed)
 Nutrition Follow-up:  Pt with progression of breast cancer, estrogen receptor positive. She is currently receiving liposomal doxorubicin  q28d. Patient is under the care of Dr. Lee Public.   Met with patient in infusion. She reports having more energy the last 3-4 days. States she "almost felt normal." Mouth sores were not as bad with last cycle. Pt does have one spot that has not healed from start of treatment. Patient using MMW + baking soda salt water gargles. This works well for her. Patient endorses a good appetite. Eating 3 meals most days. Patient reports drinking an Ensure for dinner if lunch meal is eaten later in the day. She is drinking 2 Ensure Complete. Patient denies nausea, vomiting, diarrhea, constipation.   Medications: reviewed   Labs: K 3.1, albumin  2.8  Anthropometrics: Wt 185 lb 6.4 oz today - trending down   5/20 - 186 lb 12.8 oz  4/22 - 191 lb 1.6 oz   NUTRITION DIAGNOSIS: Unintended wt loss - ongoing    INTERVENTION:  Reinforced importance of adequate calorie and protein energy intake Continue drinking 2 Ensure Complete, recommend drinking in between meals vs as meal replacement - samples + coupons  Encourage soft moist textures with mouth sores - smoothie recipes provided Continue baking soda salt water rinses     MONITORING, EVALUATION, GOAL: wt trends, intake    NEXT VISIT: Monday July 14 during infusion with Joli

## 2024-05-07 ENCOUNTER — Other Ambulatory Visit: Payer: Self-pay

## 2024-05-07 ENCOUNTER — Other Ambulatory Visit (HOSPITAL_COMMUNITY): Payer: Self-pay

## 2024-05-07 MED ORDER — DEXAMETHASONE 0.5 MG/5ML PO SOLN
1.0000 mg | Freq: Four times a day (QID) | ORAL | 4 refills | Status: DC
Start: 2024-04-12 — End: 2024-08-05
  Filled 2024-05-07: qty 500, 13d supply, fill #0
  Filled 2024-05-30: qty 500, 13d supply, fill #1

## 2024-05-08 ENCOUNTER — Ambulatory Visit (INDEPENDENT_AMBULATORY_CARE_PROVIDER_SITE_OTHER): Admitting: Family Medicine

## 2024-05-08 ENCOUNTER — Encounter: Payer: Self-pay | Admitting: Family Medicine

## 2024-05-08 DIAGNOSIS — F32A Depression, unspecified: Secondary | ICD-10-CM | POA: Diagnosis not present

## 2024-05-08 DIAGNOSIS — C50911 Malignant neoplasm of unspecified site of right female breast: Secondary | ICD-10-CM

## 2024-05-08 DIAGNOSIS — F419 Anxiety disorder, unspecified: Secondary | ICD-10-CM | POA: Diagnosis not present

## 2024-05-08 DIAGNOSIS — C50919 Malignant neoplasm of unspecified site of unspecified female breast: Secondary | ICD-10-CM

## 2024-05-08 DIAGNOSIS — Z170 Estrogen receptor positive status [ER+]: Secondary | ICD-10-CM

## 2024-05-08 MED ORDER — BUPROPION HCL ER (XL) 150 MG PO TB24: 150.0000 mg | ORAL_TABLET | Freq: Every day | ORAL | 1 refills | Status: DC

## 2024-05-08 NOTE — Progress Notes (Unsigned)
   Established Patient Office Visit   Subjective:  Patient ID: April [name], female    DOB: 1961/01/01  Age: 63 y.o. MRN: [id number]  Chief Complaint  Patient presents with   Medical Management of Chronic Issues    HPI Depression and Anxiety: Chronic. On previous appointment, 02/13/2024, started Wellbutrin  XR 150mg  daily. Since, patient has faced a lot of health challenges with breast cancer and receiving chemotherapy. Patient reports the Wellbutrin  has helped with her depression and anxiety. Today, she is smiling in office and is joyful with having a new grand daughter.  ROS See HPI above     Objective:   BP 100/62   Pulse (!) 105   Temp 97.9 F (36.6 C) (Oral)   Ht 5\' 3"  (1.6 m)   Wt 189 lb (85.7 kg)   SpO2 95%   BMI 33.48 kg/m    Physical Exam Vitals reviewed.  Constitutional:      General: She is not in acute distress.    Appearance: Normal appearance. She is not ill-appearing, toxic-appearing or diaphoretic.  HENT:     Head: Normocephalic and atraumatic.  Eyes:     General:        Right eye: No discharge.        Left eye: No discharge.     Conjunctiva/sclera: Conjunctivae normal.  Cardiovascular:     Rate and Rhythm: Normal rate and regular rhythm.     Heart sounds: Normal heart sounds. No murmur heard.    No friction rub. No gallop.  Pulmonary:     Effort: Pulmonary effort is normal. No respiratory distress.     Breath sounds: Normal breath sounds.  Musculoskeletal:        General: Normal range of motion.  Skin:    General: Skin is warm and dry.  Neurological:     General: No focal deficit present.     Mental Status: She is alert and oriented to person, place, and time. Mental status is at baseline.  Psychiatric:        Mood and Affect: Mood normal.        Behavior: Behavior normal. Behavior is cooperative.        Thought Content: Thought content normal.        Judgment: Judgment normal.     Comments: Smiling, laughs.       Assessment & Plan:   Depression, unspecified depression type Assessment & Plan: Stable. Patient scored 4 on PHQ-9 and 3 on GAD-7. Continue with Wellbutrin  XR 150mg  daily. Refilled medication.   Orders: -     buPROPion  HCl ER (XL); Take 1 tablet (150 mg total) by mouth daily.  Dispense: 90 tablet; Refill: 1  Anxiety Assessment & Plan: Stable. Patient scored 4 on PHQ-9 and 3 on GAD-7. Continue with Wellbutrin  XR 150mg  daily. Refilled medication.   Orders: -     buPROPion  HCl ER (XL); Take 1 tablet (150 mg total) by mouth daily.  Dispense: 90 tablet; Refill: 1   Return in about 6 months (around 11/07/2024) for chronic management.   Helaina Stefano, NP

## 2024-05-09 ENCOUNTER — Encounter: Payer: Self-pay | Admitting: Hematology and Oncology

## 2024-05-09 ENCOUNTER — Other Ambulatory Visit: Payer: Self-pay

## 2024-05-09 NOTE — Assessment & Plan Note (Signed)
 Stable. Patient scored 4 on PHQ-9 and 3 on GAD-7. Continue with Wellbutrin  XR 150mg  daily. Refilled medication.

## 2024-05-09 NOTE — Patient Instructions (Signed)
-  It was so great to see you and I am so proud of your spirit! Keep smiling and laughing, it looks great on you. Also, take care of that grand baby!  -Continue with Wellbutrin  XR 150mg  daily. Refilled medication. -Follow up 6 months for chronic management.

## 2024-05-15 ENCOUNTER — Encounter: Payer: Self-pay | Admitting: Hematology and Oncology

## 2024-05-15 ENCOUNTER — Other Ambulatory Visit: Payer: Self-pay | Admitting: Hematology and Oncology

## 2024-05-15 ENCOUNTER — Telehealth: Payer: Self-pay

## 2024-05-15 MED ORDER — MORPHINE SULFATE (CONCENTRATE) 10 MG /0.5 ML PO SOLN: 10.0000 mg | ORAL | 0 refills | Status: DC | PRN

## 2024-05-15 NOTE — Progress Notes (Signed)
 Severe mouth sores: Sent a prescription for liquid morphine 

## 2024-05-15 NOTE — Telephone Encounter (Signed)
 Notified Patient of prior authorization approval for Morphine  Sulfate (Concentrate) 10 mg/0.5 ml Concentrated Solution. Medication is approved through 11/14/2024. Pharmacy notified and relays that medication will need to be ordered. No other needs or concerns noted at this time.

## 2024-05-19 ENCOUNTER — Encounter: Payer: Self-pay | Admitting: Hematology and Oncology

## 2024-05-23 MED FILL — Fosaprepitant Dimeglumine For IV Infusion 150 MG (Base Eq): INTRAVENOUS | Qty: 5 | Status: AC

## 2024-05-24 ENCOUNTER — Inpatient Hospital Stay

## 2024-05-24 VITALS — BP 110/78 | HR 96 | Temp 98.0°F | Resp 18

## 2024-05-24 DIAGNOSIS — Z5111 Encounter for antineoplastic chemotherapy: Secondary | ICD-10-CM | POA: Diagnosis not present

## 2024-05-24 DIAGNOSIS — C50911 Malignant neoplasm of unspecified site of right female breast: Secondary | ICD-10-CM

## 2024-05-24 MED ORDER — FILGRASTIM-SNDZ 480 MCG/0.8ML IJ SOSY
480.0000 ug | PREFILLED_SYRINGE | Freq: Once | INTRAMUSCULAR | Status: AC
  Administered 2024-05-24: 480 ug via SUBCUTANEOUS

## 2024-05-24 MED ORDER — FILGRASTIM-SNDZ 480 MCG/0.8ML IJ SOSY: 480.0000 ug | PREFILLED_SYRINGE | Freq: Every day | INTRAMUSCULAR | Status: DC

## 2024-05-25 NOTE — Assessment & Plan Note (Signed)
 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  (ER +95%, PR +95%, HER2 negative null score 0, MSI: Stable, TMB 5, genomic LOH: High   CT CAP: Liver mets 2.9 cm (was 1.7 cm), 2.5 cm (was 2.7 cm) others stable, enl sub pleural nodule 0.8 cm, increase in Left supraclav LN and pe vascular LN), stable bone mets  -------------------------------------------------------- Current treatment: Datopotamab deruxtecan cycle 3 Echocardiogram 04/09/2024: EF 60 to 65%  Patient had an eye exam to make sure she can safely receive Datroway  Toxicities: Jitteriness: Possibly due to Decadron .  Oral Decadron  discontinued Nausea: Alternating Compazine  and Zofran  which is helping Cytopenias: We will administer a dose of Granix  prior to each of her treatments. Dehydration: Much improved after the break in treatment Oral mucositis: Mild and improved because of the steroid mouthwash.  There is a sore on the tongue that is not improving. Profound fatigue   Return to clinic in 3 weeks for cycle 4

## 2024-05-26 ENCOUNTER — Inpatient Hospital Stay

## 2024-05-26 ENCOUNTER — Other Ambulatory Visit: Payer: Self-pay | Admitting: Pharmacist

## 2024-05-26 ENCOUNTER — Inpatient Hospital Stay (HOSPITAL_BASED_OUTPATIENT_CLINIC_OR_DEPARTMENT_OTHER): Admitting: Hematology and Oncology

## 2024-05-26 ENCOUNTER — Encounter: Payer: Self-pay | Admitting: Hematology and Oncology

## 2024-05-26 VITALS — BP 106/65 | HR 94 | Temp 97.7°F | Resp 18 | Wt 181.9 lb

## 2024-05-26 VITALS — BP 116/79 | HR 86 | Resp 16

## 2024-05-26 DIAGNOSIS — Z170 Estrogen receptor positive status [ER+]: Secondary | ICD-10-CM

## 2024-05-26 DIAGNOSIS — C773 Secondary and unspecified malignant neoplasm of axilla and upper limb lymph nodes: Secondary | ICD-10-CM

## 2024-05-26 DIAGNOSIS — C50411 Malignant neoplasm of upper-outer quadrant of right female breast: Secondary | ICD-10-CM

## 2024-05-26 DIAGNOSIS — C50911 Malignant neoplasm of unspecified site of right female breast: Secondary | ICD-10-CM

## 2024-05-26 DIAGNOSIS — Z5111 Encounter for antineoplastic chemotherapy: Secondary | ICD-10-CM | POA: Diagnosis not present

## 2024-05-26 DIAGNOSIS — C50919 Malignant neoplasm of unspecified site of unspecified female breast: Secondary | ICD-10-CM

## 2024-05-26 LAB — CMP (CANCER CENTER ONLY)
ALT: 22 U/L (ref 0–44)
AST: 37 U/L (ref 15–41)
Albumin: 2.9 g/dL — ABNORMAL LOW (ref 3.5–5.0)
Alkaline Phosphatase: 293 U/L — ABNORMAL HIGH (ref 38–126)
Anion gap: 5 (ref 5–15)
BUN: 16 mg/dL (ref 8–23)
CO2: 25 mmol/L (ref 22–32)
Calcium: 8.4 mg/dL — ABNORMAL LOW (ref 8.9–10.3)
Chloride: 109 mmol/L (ref 98–111)
Creatinine: 0.41 mg/dL — ABNORMAL LOW (ref 0.44–1.00)
GFR, Estimated: >60 mL/min (ref >60)
Glucose, Bld: 106 mg/dL — ABNORMAL HIGH (ref 70–99)
Potassium: 3.4 mmol/L — ABNORMAL LOW (ref 3.5–5.1)
Sodium: 139 mmol/L (ref 135–145)
Total Bilirubin: 1 mg/dL (ref 0.0–1.2)
Total Protein: 5.6 g/dL — ABNORMAL LOW (ref 6.5–8.1)

## 2024-05-26 LAB — CBC WITH DIFFERENTIAL (CANCER CENTER ONLY)
Abs Immature Granulocytes: 0.03 10*3/uL (ref 0.00–0.07)
Basophils Absolute: 0 10*3/uL (ref 0.0–0.1)
Basophils Relative: 0 %
Eosinophils Absolute: 0.1 10*3/uL (ref 0.0–0.5)
Eosinophils Relative: 1 %
HCT: 31.3 % — ABNORMAL LOW (ref 36.0–46.0)
Hemoglobin: 11 g/dL — ABNORMAL LOW (ref 12.0–15.0)
Immature Granulocytes: 1 %
Lymphocytes Relative: 5 %
Lymphs Abs: 0.3 10*3/uL — ABNORMAL LOW (ref 0.7–4.0)
MCH: 40.3 pg — ABNORMAL HIGH (ref 26.0–34.0)
MCHC: 35.1 g/dL (ref 30.0–36.0)
MCV: 114.7 fL — ABNORMAL HIGH (ref 80.0–100.0)
Monocytes Absolute: 0.5 10*3/uL (ref 0.1–1.0)
Monocytes Relative: 9 %
Neutro Abs: 4.4 10*3/uL (ref 1.7–7.7)
Neutrophils Relative %: 84 %
Platelet Count: 88 10*3/uL — ABNORMAL LOW (ref 150–400)
RBC: 2.73 MIL/uL — ABNORMAL LOW (ref 3.87–5.11)
RDW: 18 % — ABNORMAL HIGH (ref 11.5–15.5)
WBC Count: 5.3 10*3/uL (ref 4.0–10.5)
nRBC: 0 % (ref 0.0–0.2)

## 2024-05-26 MED ORDER — ACETAMINOPHEN 325 MG PO TABS
650.0000 mg | ORAL_TABLET | Freq: Once | ORAL | Status: AC
  Administered 2024-05-26: 650 mg via ORAL
  Filled 2024-05-26: qty 2

## 2024-05-26 MED ORDER — DENOSUMAB 120 MG/1.7ML ~~LOC~~ SOLN
120.0000 mg | Freq: Once | SUBCUTANEOUS | Status: AC
  Administered 2024-05-26: 120 mg via SUBCUTANEOUS
  Filled 2024-05-26: qty 1.7

## 2024-05-26 MED ORDER — SODIUM CHLORIDE 0.9 % IV SOLN
150.0000 mg | Freq: Once | INTRAVENOUS | Status: AC
  Administered 2024-05-26: 150 mg via INTRAVENOUS
  Filled 2024-05-26: qty 150

## 2024-05-26 MED ORDER — CETIRIZINE HCL 10 MG/ML IV SOLN
10.0000 mg | Freq: Once | INTRAVENOUS | Status: AC
  Administered 2024-05-26: 10 mg via INTRAVENOUS
  Filled 2024-05-26: qty 1

## 2024-05-26 MED ORDER — DEXTROSE 5 % IV SOLN: 4.0000 mg/kg | Freq: Once | INTRAVENOUS | Status: DC

## 2024-05-26 MED ORDER — DEXAMETHASONE SODIUM PHOSPHATE 10 MG/ML IJ SOLN
10.0000 mg | Freq: Once | INTRAMUSCULAR | Status: AC
  Administered 2024-05-26: 10 mg via INTRAVENOUS
  Filled 2024-05-26: qty 1

## 2024-05-26 MED ORDER — HEPARIN SOD (PORK) LOCK FLUSH 100 UNIT/ML IV SOLN
500.0000 [IU] | Freq: Once | INTRAVENOUS | Status: AC | PRN
  Administered 2024-05-26: 500 [IU]

## 2024-05-26 MED ORDER — DEXTROSE 5 % IV SOLN
4.8000 mg/kg | Freq: Once | INTRAVENOUS | Status: AC
  Administered 2024-05-26: 400 mg via INTRAVENOUS
  Filled 2024-05-26: qty 20

## 2024-05-26 MED ORDER — DIPHENHYDRAMINE HCL 25 MG PO CAPS
25.0000 mg | ORAL_CAPSULE | Freq: Once | ORAL | Status: AC
  Administered 2024-05-26: 25 mg via ORAL
  Filled 2024-05-26: qty 1

## 2024-05-26 MED ORDER — SODIUM CHLORIDE 0.9% FLUSH
10.0000 mL | Freq: Once | INTRAVENOUS | Status: AC
  Administered 2024-05-26: 10 mL

## 2024-05-26 MED ORDER — PALONOSETRON HCL INJECTION 0.25 MG/5ML
0.2500 mg | Freq: Once | INTRAVENOUS | Status: AC
  Administered 2024-05-26: 0.25 mg via INTRAVENOUS
  Filled 2024-05-26: qty 5

## 2024-05-26 MED ORDER — SODIUM CHLORIDE 0.9% FLUSH
10.0000 mL | INTRAVENOUS | Status: DC | PRN
  Administered 2024-05-26: 10 mL

## 2024-05-26 MED ORDER — DEXTROSE 5 % IV SOLN: INTRAVENOUS | Status: DC

## 2024-05-26 MED ORDER — FAMOTIDINE IN NACL 20-0.9 MG/50ML-% IV SOLN
20.0000 mg | Freq: Once | INTRAVENOUS | Status: AC
  Administered 2024-05-26: 20 mg via INTRAVENOUS
  Filled 2024-05-26: qty 50

## 2024-05-26 NOTE — Patient Instructions (Signed)
 [facility] - A DEPT OF [facility]  Discharge Instructions: Thank you for choosing [facility] to provide your oncology and hematology care.   If you have a lab appointment with the Cancer Center, please go directly to the Cancer Center and check in at the registration area.   Wear comfortable clothing and clothing appropriate for easy access to any Portacath or PICC line.   We strive to give you quality time with your provider. You may need to reschedule your appointment if you arrive late (15 or more minutes).  Arriving late affects you and other patients whose appointments are after yours.  Also, if you miss three or more appointments without notifying the office, you may be dismissed from the clinic at the provider's discretion.      For prescription refill requests, have your pharmacy contact our office and allow 72 hours for refills to be completed.    Today you received the following chemotherapy and/or immunotherapy agents: Datroway      To help prevent nausea and vomiting after your treatment, we encourage you to take your nausea medication as directed.  BELOW ARE SYMPTOMS THAT SHOULD BE REPORTED IMMEDIATELY: *FEVER GREATER THAN 100.4 F (38 C) OR HIGHER *CHILLS OR SWEATING *NAUSEA AND VOMITING THAT IS NOT CONTROLLED WITH YOUR NAUSEA MEDICATION *UNUSUAL SHORTNESS OF BREATH *UNUSUAL BRUISING OR BLEEDING *URINARY PROBLEMS (pain or burning when urinating, or frequent urination) *BOWEL PROBLEMS (unusual diarrhea, constipation, pain near the anus) TENDERNESS IN MOUTH AND THROAT WITH OR WITHOUT PRESENCE OF ULCERS (sore throat, sores in mouth, or a toothache) UNUSUAL RASH, SWELLING OR PAIN  UNUSUAL VAGINAL DISCHARGE OR ITCHING   Items with * indicate a potential emergency and should be followed up as soon as possible or go to the Emergency Department if any problems should occur.  Please show the CHEMOTHERAPY ALERT CARD or IMMUNOTHERAPY  ALERT CARD at check-in to the Emergency Department and triage nurse.  Should you have questions after your visit or need to cancel or reschedule your appointment, please contact [facility] - A DEPT OF [facility]  Dept: [phone]  and follow the prompts.  Office hours are 8:00 a.m. to 4:30 p.m. Monday - Friday. Please note that voicemails left after 4:00 p.m. may not be returned until the following business day.  We are closed weekends and major holidays. You have access to a nurse at all times for urgent questions. Please call the main number to the clinic Dept: [phone] and follow the prompts.   For any non-urgent questions, you may also contact your provider using MyChart. We now offer e-Visits for anyone 67 and older to request care online for non-urgent symptoms. For details visit [email or web address].   Also download the MyChart app! Go to the app store, search "MyChart", open the app, select [place], and log in with your MyChart username and password.

## 2024-05-26 NOTE — Progress Notes (Signed)
 OK to infuse Datopotamab over 30 minutes.  This is not her first infusion.  Ozro Russett, Pharm.D., CPP 05/26/2024@1 :28 PM

## 2024-05-26 NOTE — Progress Notes (Signed)
 Dr. Odean aware of patient calcium and Scr levels- calcium is 8.4 today, but 9.28 corrected. Appropriate for xgeva  per MD.

## 2024-05-26 NOTE — Progress Notes (Signed)
 Patient Care Team: Billy Philippe SAUNDERS, NP as PCP - General (Family Medicine) Zenaida Morene PARAS, MD as PCP - Cardiology (Cardiology) Izell Domino, MD as Attending Physician (Radiation Oncology) Crawford, Morna Pickle, NP as Nurse Practitioner (Hematology and Oncology) Signe Mitzie LABOR, MD as Consulting Physician (General Surgery) Janit Thresa HERO, DPM as Consulting Physician (Podiatry) Lennard Lesta FALCON, MD as Consulting Physician (Gastroenterology) Odean Potts, MD as Attending Physician (Hematology and Oncology)  DIAGNOSIS:  Encounter Diagnoses  Name Primary?   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) Yes   Malignant neoplasm of upper-outer quadrant of right breast in female, estrogen receptor positive (HCC)     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)   06/21/2022 - 08/03/2022 Chemotherapy   Patient is on Treatment Plan : BREAST METASTATIC fam-trastuzumab deruxtecan-nxki  (Enhertu ) q21d     06/21/2022 - 04/19/2023 Chemotherapy   Patient is on Treatment Plan : BREAST METASTATIC Fam-Trastuzumab Deruxtecan-nxki  (Enhertu ) (5.4) q21d     05/18/2023 - 07/06/2023 Chemotherapy   Patient is on Treatment Plan : BREAST METASTATIC Sacituzumab govitecan -hziy (Trodelvy ) D1,8 q21d     10/16/2023 - 01/05/2024 Chemotherapy   Patient is on Treatment Plan : BREAST METASTATIC Eribulin  D1,8 q21d     01/21/2024 - 03/17/2024 Chemotherapy   Patient is on Treatment Plan : BREAST Liposomal Doxorubicin  (50) q28d     04/15/2024 -  Chemotherapy   Patient is on Treatment Plan : BREAST Datopotamab Deruxtecan-dlnk  (Datroway ) q21d     Malignant neoplasm of upper-outer quadrant of right breast in female, estrogen receptor positive (HCC)  06/21/2016 Initial Diagnosis   Malignant neoplasm of upper-outer quadrant of right breast in female,  estrogen receptor positive (HCC)   06/21/2016 Initial Biopsy   Right breast biopsy, 10 oclock: IDC, grade 3, ER+(95%), PR+(80%),Ki67 20%, HER-2 negative (ratio 0.67). Right axilla core biopsy: carcinoma, grade 3, ER+(100%), PR+(90%), Ki67 25%, HER-2 negative (ratio 1.13).    07/17/2016 - 11/06/2016 Neo-Adjuvant Chemotherapy   Received 2 cycles of Doxorubicin  and Cyclophosphamide , then transitioned to weekly Paclitaxel  (due to repeated febrile neutropenia) x 7 cycles, stopped early due to peripheral neuropathy, then completed her final 2 cycles of Doxorubicin  and Cyclophosphamide .    12/19/2016 Surgery   Right breast lumpectomy ([facility]): IDC, grade 2, 1.6cm and 0.3cm, margins negative, 3 SLN positive for metastatic carcinoma.     12/26/2016 Surgery   ALND: metastatic carcinoma in one of 20 lymph nodes, and three nodes from previous lumpectomy.  Four positive nodes, consistent with pN2a.   02/07/2017 - 03/21/2017 Radiation Therapy   Adjuvant radiation Audry): 1) Right breast and nodes - 4 field: 50 Gy in 25 fractions. IM NODES: >95% receive at least 45Gy/66fx. 50Gy to SCLV/PAB @ 2Gy /fraction x 25 fractions. 2) Right breast boost: 10 Gy in 5 fractions   04/2017 -  Anti-estrogen oral therapy   Anastrozole  1mg  daily.  Bone density 03/23/2017 finds T score of -2.6, osteoporosis, plan to start Prolia  following dental clearance Anastrozole  stopped 01/16/18 Exemestane  25 mg daily 02/12/18  On [name], trial randomized to endocrine therapy alone   05/27/2020 Progression   chest CT scan 05/27/2020 shows bulky mediastinal and right hilar lymphadenopathy with right pleural nodules and a small right pleural effusion, no evidence of liver or bone involvement             (a) biopsy of right breast mass  06/02/2020 shows invasive ductal carcinoma, estrogen and progesterone receptor positive, HER-2 not amplified, with an MIB-1 of 40%   06/17/2020 Treatment Plan Change    fulvestrant  to start 06/17/2020              (a) palbociclib  to start 06/17/2020 at 125 mg daily 21 days on 7 days off             (b) palbociclib  decreased to 125mg  every other day x 11 doses on 07/23/2020             (c) palbociclib  dose decreased to100 mg daily, 21/7, starting with September cycle             (d) Palbociclib  decreased to 75mg  daily beginning with March cycle due to oral ulcers             (e) palbociclib  discontinued November 2022 with progression   09/14/2021 PET scan   PET scan 09/14/2021 shows progression in liver and bone   10/05/2021 Pathology Results   liver biopsy 10/05/2021 confirms metastatic carcinoma, estrogen receptor strongly positive, progesterone receptor and HER2 negative, with an MIB-1 of 30%.   10/2021 Treatment Plan Change   fulvestrant  continued, Xgeva  added, palbociclib  changed to abemaciclib  November 2022 -Dose Decreased to 50mg  PO BID Daily   03/07/2022 Miscellaneous   [facility] molecular testing: ER positive, ER positive, MTAP: Detected, ESR 1 negative, BRCA 1 and 2 negative, MSI stable, PD-L1 negative, PIK 3 CA negative, PR negative,   06/21/2022 - 08/03/2022 Chemotherapy   Patient is on Treatment Plan : BREAST METASTATIC fam-trastuzumab deruxtecan-nxki  (Enhertu ) q21d     06/21/2022 - 04/19/2023 Chemotherapy   Patient is on Treatment Plan : BREAST METASTATIC Fam-Trastuzumab Deruxtecan-nxki  (Enhertu ) (5.4) q21d     01/25/2023 Cancer Staging   Staging form: Breast, AJCC 7th Edition - Pathologic: Stage IV (M1) - Signed by Crawford Morna Pickle, NP on 01/25/2023   05/18/2023 - 07/06/2023 Chemotherapy   Patient is on Treatment Plan : BREAST METASTATIC Sacituzumab govitecan -hziy (Trodelvy ) D1,8 q21d     07/23/2023 -  Chemotherapy   Xeloda  1000 mg bid 14 days on and 7 days off   08/27/2023 Procedure   Y90 hepatic radio embolization   10/16/2023 - 01/05/2024 Chemotherapy   Patient is on Treatment Plan : BREAST METASTATIC Eribulin  D1,8 q21d     01/21/2024 - 03/17/2024 Chemotherapy   Patient is on  Treatment Plan : BREAST Liposomal Doxorubicin  (50) q28d     04/15/2024 -  Chemotherapy   Patient is on Treatment Plan : BREAST Datopotamab Deruxtecan-dlnk  (Datroway ) q21d     Breast cancer, stage 4 (HCC)  03/09/2022 Initial Diagnosis   Breast cancer, stage 4 (HCC)   06/21/2022 - 08/03/2022 Chemotherapy   Patient is on Treatment Plan : BREAST METASTATIC fam-trastuzumab deruxtecan-nxki  (Enhertu ) q21d     06/21/2022 - 04/19/2023 Chemotherapy   Patient is on Treatment Plan : BREAST METASTATIC Fam-Trastuzumab Deruxtecan-nxki  (Enhertu ) (5.4) q21d     05/18/2023 - 07/06/2023 Chemotherapy   Patient is on Treatment Plan : BREAST METASTATIC Sacituzumab govitecan -hziy (Trodelvy ) D1,8 q21d     10/16/2023 - 01/05/2024 Chemotherapy   Patient is on Treatment Plan : BREAST METASTATIC Eribulin  D1,8 q21d     01/21/2024 - 03/17/2024 Chemotherapy   Patient is on Treatment Plan : BREAST Liposomal Doxorubicin  (50) q28d     04/15/2024 -  Chemotherapy   Patient is on Treatment Plan : BREAST Datopotamab Deruxtecan-dlnk  (Datroway ) q21d       CHIEF COMPLIANT:   HISTORY  OF PRESENT ILLNESS: Discussed the use of AI scribe software for clinical note transcription with the patient, who gave verbal consent to proceed.  History of Present Illness April [name] is a 63 year old female who presents for ongoing cancer treatment and monitoring.  She experiences persistent symptoms that flare up with treatment, requiring morphine  for management. She takes morphine  once daily, which aids in performing activities like gargling. A recent shot on Saturday improved her symptoms. Treatment sessions have been longer than expected, lasting about two and a half hours.     ALLERGIES:  is allergic to datopotamab deruxtecan, eribulin  mesylate, codeine , cymbalta  [duloxetine  hcl], hydrocodone , ultram  [tramadol  hcl], and venlafaxine .  MEDICATIONS:  Current Outpatient Medications  Medication Sig Dispense Refill   buPROPion  (WELLBUTRIN  XL)  150 MG 24 hr tablet Take 1 tablet (150 mg total) by mouth daily. 90 tablet 1   Carboxymethylcell-Glycerin  PF 0.5-0.9 % SOLN Place 2 drops into both eyes 4 (four) times daily. And as needed. 1 each 11   cromolyn (OPTICROM) 4 % ophthalmic solution SMARTSIG:In Eye(s)     cyclobenzaprine  (FLEXERIL ) 5 MG tablet Take 1 tablet (5 mg total) by mouth 3 (three) times daily as needed for muscle spasms. 30 tablet 0   Denosumab  (XGEVA  [place]) Inject 120 mg into the skin every 3 (three) months.     dexamethasone  (DECADRON ) 0.5 MG/5ML solution Swish 10 mL (1 mg) by mouth for 2 minutes, then spit. Repeat 4 times daily. Start on 1st day of datopotamab deruxtecan. 500 mL 4   dexamethasone  (DECADRON ) 0.5 MG/5ML solution Swish 10 mLs (1 mg total) for 2 minutes, then spit. Repeat 4 (four) times daily. Start on 1st day of Datopotamab 500 mL 4   fluconazole  (DIFLUCAN ) 200 MG tablet Take 1 tablet (200 mg total) by mouth daily. 30 tablet 0   furosemide  (LASIX ) 40 MG tablet Take 40 mg by mouth 2 (two) times daily.     KLOR-CON  M10 10 MEQ tablet TAKE 1 TABLET BY MOUTH EVERY DAY 90 tablet 1   loratadine  (CLARITIN ) 10 MG tablet Take 10 mg by mouth daily.     magic mouthwash (nystatin , lidocaine , diphenhydrAMINE , alum & mag hydroxide) suspension Take 5 mLs by mouth 4 (four) times daily as needed for mouth pain. Suspension contains equal amounts of Maalox Extra Strength, nystatin , diphenhydramine  and lidocaine . 240 mL 2   Morphine  Sulfate (MORPHINE  CONCENTRATE) 10 mg / 0.5 ml concentrated solution Take 0.5 mLs (10 mg total) by mouth every 2 (two) hours as needed. 120 mL 0   naproxen sodium (ALEVE) 220 MG tablet Take 440-660 mg by mouth 2 (two) times daily as needed (pain).     omeprazole  (PRILOSEC) 20 MG capsule Take 20 mg by mouth daily.     ondansetron  (ZOFRAN ) 8 MG tablet Take 1 tablet (8 mg total) by mouth every 8 (eight) hours as needed for nausea or vomiting. Start on the third day after chemotherapy. 30 tablet 1    prochlorperazine  (COMPAZINE ) 10 MG tablet Take 1 tablet (10 mg total) by mouth every 6 (six) hours as needed for nausea or vomiting. 30 tablet 1   rizatriptan  (MAXALT ) 5 MG tablet Take 1 tablet (5 mg total) by mouth as needed for migraine. May repeat in 2 hours if needed 10 tablet 0   valACYclovir  (VALTREX ) 1000 MG tablet Take 1 tablet (1,000 mg total) by mouth 2 (two) times daily. 180 tablet 4   XIIDRA 5 % SOLN      No current facility-administered  medications for this visit.    PHYSICAL EXAMINATION: ECOG PERFORMANCE STATUS: 1 - Symptomatic but completely ambulatory  Vitals:   05/26/24 1057  BP: 106/65  Pulse: 94  Resp: 18  Temp: 97.7 F (36.5 C)  SpO2: 98%   Filed Weights   05/26/24 1057  Weight: 181 lb 14.4 oz (82.5 kg)    Physical Exam   (exam performed in the presence of a chaperone)  LABORATORY DATA:  I have reviewed the data as listed    Latest Ref Rng & Units 05/05/2024    9:28 AM 04/22/2024    9:22 AM 04/15/2024   10:16 AM  CMP  Glucose 70 - 99 mg/dL 98  893  879   BUN 8 - 23 mg/dL 9  18  14    Creatinine 0.44 - 1.00 mg/dL 9.50  9.40  9.52   Sodium 135 - 145 mmol/L 138  137  139   Potassium 3.5 - 5.1 mmol/L 3.1  3.4  3.7   Chloride 98 - 111 mmol/L 105  103  111   CO2 22 - 32 mmol/L 27  30  25    Calcium 8.9 - 10.3 mg/dL 8.4  8.5  8.5   Total Protein 6.5 - 8.1 g/dL 5.7  5.7  5.9   Total Bilirubin 0.0 - 1.2 mg/dL 1.6  2.3  1.2   Alkaline Phos 38 - 126 U/L 349  357  334   AST 15 - 41 U/L 41  81  47   ALT 0 - 44 U/L 29  84  25     Lab Results  Component Value Date   WBC 2.0 (L) 05/05/2024   HGB 10.8 (L) 05/05/2024   HCT 30.8 (L) 05/05/2024   MCV 112.4 (H) 05/05/2024   PLT 104 (L) 05/05/2024   NEUTROABS 1.4 (L) 05/05/2024    ASSESSMENT & PLAN:  Malignant neoplasm of upper-outer quadrant of right breast in female, 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  (ER +95%, PR +95%, HER2 negative null score 0, MSI: Stable, TMB 5, genomic LOH: High   CT CAP: Liver mets 2.9 cm (was 1.7 cm), 2.5 cm (was 2.7 cm) others stable, enl sub pleural nodule 0.8 cm, increase in Left supraclav LN and pe vascular LN), stable bone mets  -------------------------------------------------------- Current treatment: Datopotamab deruxtecan cycle 3 Echocardiogram 04/09/2024: EF 60 to 65%  Patient had an eye exam to make sure she can safely receive Datroway  Toxicities: Jitteriness: Possibly due to Decadron .  Oral Decadron  discontinued Nausea: Alternating Compazine  and Zofran  which is helping Cytopenias: With the Granix  injection her white count has stabilized.  Dehydration: Much improved after the break in treatment Oral mucositis: Mild and improved because of the steroid mouthwash.  There is a sore on the tongue that is not improving. Profound fatigue   Return to clinic in 3 weeks for cycle 4 ------------------------------------- Assessment and Plan Assessment & Plan Low platelet count Platelet count slightly low but stable, treatment continuation appropriate. - Proceed with current treatment regimen. - Monitor platelet count closely. -  Hold treatment if platelet count drops below 75.      No orders of the defined types were placed in this encounter.  The patient has a good understanding of the overall plan. she agrees with it. she will call with any problems that may develop before the next visit here. Total time spent: 30  mins including face to face time and time spent for planning, charting and co-ordination of care   Viinay K Vonceil [place], MD 05/26/24

## 2024-05-26 NOTE — Progress Notes (Signed)
 Confirmed Datopotamab dose w/ Dr. Gudena; ok to continue 400 mg.  Wilma Dollar, Pharm.D., CPP 05/26/2024@12 :09 PM

## 2024-05-27 LAB — CANCER ANTIGEN 27.29: CA 27.29: 276.8 U/mL — ABNORMAL HIGH (ref 0.0–38.6)

## 2024-05-28 ENCOUNTER — Ambulatory Visit: Payer: Self-pay | Admitting: Internal Medicine

## 2024-05-30 ENCOUNTER — Other Ambulatory Visit (HOSPITAL_BASED_OUTPATIENT_CLINIC_OR_DEPARTMENT_OTHER): Payer: Self-pay

## 2024-06-11 ENCOUNTER — Encounter: Payer: Self-pay | Admitting: Hematology and Oncology

## 2024-06-11 ENCOUNTER — Other Ambulatory Visit: Payer: Self-pay | Admitting: Hematology and Oncology

## 2024-06-13 MED FILL — Fosaprepitant Dimeglumine For IV Infusion 150 MG (Base Eq): INTRAVENOUS | Qty: 5 | Status: AC

## 2024-06-14 ENCOUNTER — Inpatient Hospital Stay: Attending: Oncology

## 2024-06-14 VITALS — BP 111/65 | HR 98 | Temp 97.9°F | Resp 19

## 2024-06-14 DIAGNOSIS — C773 Secondary and unspecified malignant neoplasm of axilla and upper limb lymph nodes: Secondary | ICD-10-CM | POA: Insufficient documentation

## 2024-06-14 DIAGNOSIS — Z79899 Other long term (current) drug therapy: Secondary | ICD-10-CM | POA: Diagnosis not present

## 2024-06-14 DIAGNOSIS — R530 Neoplastic (malignant) related fatigue: Secondary | ICD-10-CM | POA: Insufficient documentation

## 2024-06-14 DIAGNOSIS — K1379 Other lesions of oral mucosa: Secondary | ICD-10-CM | POA: Insufficient documentation

## 2024-06-14 DIAGNOSIS — C7951 Secondary malignant neoplasm of bone: Secondary | ICD-10-CM | POA: Insufficient documentation

## 2024-06-14 DIAGNOSIS — C50411 Malignant neoplasm of upper-outer quadrant of right female breast: Secondary | ICD-10-CM | POA: Diagnosis not present

## 2024-06-14 DIAGNOSIS — Z79624 Long term (current) use of inhibitors of nucleotide synthesis: Secondary | ICD-10-CM | POA: Insufficient documentation

## 2024-06-14 DIAGNOSIS — M810 Age-related osteoporosis without current pathological fracture: Secondary | ICD-10-CM | POA: Diagnosis not present

## 2024-06-14 DIAGNOSIS — Z923 Personal history of irradiation: Secondary | ICD-10-CM | POA: Insufficient documentation

## 2024-06-14 DIAGNOSIS — C50911 Malignant neoplasm of unspecified site of right female breast: Secondary | ICD-10-CM

## 2024-06-14 DIAGNOSIS — Z79811 Long term (current) use of aromatase inhibitors: Secondary | ICD-10-CM | POA: Diagnosis not present

## 2024-06-14 DIAGNOSIS — Z5111 Encounter for antineoplastic chemotherapy: Secondary | ICD-10-CM | POA: Insufficient documentation

## 2024-06-14 DIAGNOSIS — E860 Dehydration: Secondary | ICD-10-CM | POA: Diagnosis not present

## 2024-06-14 DIAGNOSIS — T451X5A Adverse effect of antineoplastic and immunosuppressive drugs, initial encounter: Secondary | ICD-10-CM | POA: Diagnosis not present

## 2024-06-14 DIAGNOSIS — E46 Unspecified protein-calorie malnutrition: Secondary | ICD-10-CM | POA: Diagnosis not present

## 2024-06-14 DIAGNOSIS — Z7952 Long term (current) use of systemic steroids: Secondary | ICD-10-CM | POA: Insufficient documentation

## 2024-06-14 DIAGNOSIS — R112 Nausea with vomiting, unspecified: Secondary | ICD-10-CM | POA: Diagnosis not present

## 2024-06-14 MED ORDER — FILGRASTIM-SNDZ 480 MCG/0.8ML IJ SOSY
480.0000 ug | PREFILLED_SYRINGE | Freq: Once | INTRAMUSCULAR | Status: AC
  Administered 2024-06-14: 480 ug via SUBCUTANEOUS

## 2024-06-14 NOTE — Patient Instructions (Signed)
 Filgrastim Injection What is this medication? FILGRASTIM (fil GRA stim) lowers the risk of infection in people who are receiving chemotherapy. It works by Systems analyst make more white blood cells, which protects your body from infection. It may also be used to help people who have been exposed to high doses of radiation. It can be used to help prepare your body before a stem cell transplant. It works by helping your bone marrow make and release stem cells into the blood. This medicine may be used for other purposes; ask your health care provider or pharmacist if you have questions. COMMON BRAND NAME(S): Neupogen, Nivestym, Nypozi, Releuko, Zarxio What should I tell my care team before I take this medication? They need to know if you have any of these conditions: History of blood diseases, such as sickle cell anemia Kidney disease Recent or ongoing radiation An unusual or allergic reaction to filgrastim, pegfilgrastim, latex, rubber, other medications, foods, dyes, or preservatives Pregnant or trying to get pregnant Breast-feeding How should I use this medication? This medication is injected under the skin or into a vein. It is usually given by your care team in a hospital or clinic setting. It may be given at home. If you get this medication at home, you will be taught how to prepare and give it. Use exactly as directed. Take it as directed on the prescription label at the same time every day. Keep taking it unless your care team tells you to stop. It is important that you put your used needles and syringes in a special sharps container. Do not put them in a trash can. If you do not have a sharps container, call your pharmacist or care team to get one. This medication comes with INSTRUCTIONS FOR USE. Ask your pharmacist for directions on how to use this medication. Read the information carefully. Talk to your pharmacist or care team if you have questions. Talk to your care team about the use of  this medication in children. While it may be prescribed for children for selected conditions, precautions do apply. Overdosage: If you think you have taken too much of this medicine contact a poison control center or emergency room at once. NOTE: This medicine is only for you. Do not share this medicine with others. What if I miss a dose? It is important not to miss any doses. Talk to your care team about what to do if you miss a dose. What may interact with this medication? Medications that may cause a release of neutrophils, such as lithium This list may not describe all possible interactions. Give your health care provider a list of all the medicines, herbs, non-prescription drugs, or dietary supplements you use. Also tell them if you smoke, drink alcohol, or use illegal drugs. Some items may interact with your medicine. What should I watch for while using this medication? Your condition will be monitored carefully while you are receiving this medication. You may need bloodwork while taking this medication. Talk to your care team about your risk of cancer. You may be more at risk for certain types of cancer if you take this medication. What side effects may I notice from receiving this medication? Side effects that you should report to your care team as soon as possible: Allergic reactions--skin rash, itching, hives, swelling of the face, lips, tongue, or throat Capillary leak syndrome--stomach or muscle pain, unusual weakness or fatigue, feeling faint or lightheaded, decrease in the amount of urine, swelling of the ankles, hands,  or feet, trouble breathing High white blood cell level--fever, fatigue, trouble breathing, night sweats, change in vision, weight loss Inflammation of the aorta--fever, fatigue, back, chest, or stomach pain, severe headache Kidney injury (glomerulonephritis)--decrease in the amount of urine, red or dark brown urine, foamy or bubbly urine, swelling of the ankles, hands,  or feet Shortness of breath or trouble breathing Spleen injury--pain in upper left stomach or shoulder Unusual bruising or bleeding Side effects that usually do not require medical attention (report to your care team if they continue or are bothersome): Back pain Bone pain Fatigue Fever Headache Nausea This list may not describe all possible side effects. Call your doctor for medical advice about side effects. You may report side effects to FDA at 1-800-FDA-1088. Where should I keep my medication? Keep out of the reach of children and pets. Keep this medication in the original packaging until you are ready to take it. Protect from light. See product for storage information. Each product may have different instructions. Get rid of any unused medication after the expiration date. To get rid of medications that are no longer needed or have expired: Take the medication to a medications take-back program. Check with your pharmacy or law enforcement to find a location. If you cannot return the medication, ask your pharmacist or care team how to get rid of this medication safely. NOTE: This sheet is a summary. It may not cover all possible information. If you have questions about this medicine, talk to your doctor, pharmacist, or health care provider.  2024 Elsevier/Gold Standard (2022-04-13 00:00:00)

## 2024-06-16 ENCOUNTER — Inpatient Hospital Stay

## 2024-06-16 ENCOUNTER — Inpatient Hospital Stay (HOSPITAL_BASED_OUTPATIENT_CLINIC_OR_DEPARTMENT_OTHER): Attending: Oncology | Admitting: Hematology and Oncology

## 2024-06-16 VITALS — BP 96/56 | HR 93 | Temp 98.1°F | Resp 17 | Ht 63.0 in | Wt 187.1 lb

## 2024-06-16 DIAGNOSIS — C773 Secondary and unspecified malignant neoplasm of axilla and upper limb lymph nodes: Secondary | ICD-10-CM

## 2024-06-16 DIAGNOSIS — Z5111 Encounter for antineoplastic chemotherapy: Secondary | ICD-10-CM | POA: Diagnosis not present

## 2024-06-16 DIAGNOSIS — C50919 Malignant neoplasm of unspecified site of unspecified female breast: Secondary | ICD-10-CM

## 2024-06-16 DIAGNOSIS — Z170 Estrogen receptor positive status [ER+]: Secondary | ICD-10-CM

## 2024-06-16 DIAGNOSIS — C50411 Malignant neoplasm of upper-outer quadrant of right female breast: Secondary | ICD-10-CM

## 2024-06-16 DIAGNOSIS — C50911 Malignant neoplasm of unspecified site of right female breast: Secondary | ICD-10-CM

## 2024-06-16 LAB — CBC WITH DIFFERENTIAL (CANCER CENTER ONLY)
Abs Immature Granulocytes: 0.05 K/uL (ref 0.00–0.07)
Basophils Absolute: 0 K/uL (ref 0.0–0.1)
Basophils Relative: 1 %
Eosinophils Absolute: 0.1 K/uL (ref 0.0–0.5)
Eosinophils Relative: 1 %
HCT: 32.5 % — ABNORMAL LOW (ref 36.0–46.0)
Hemoglobin: 11.3 g/dL — ABNORMAL LOW (ref 12.0–15.0)
Immature Granulocytes: 1 %
Lymphocytes Relative: 5 %
Lymphs Abs: 0.3 K/uL — ABNORMAL LOW (ref 0.7–4.0)
MCH: 40.1 pg — ABNORMAL HIGH (ref 26.0–34.0)
MCHC: 34.8 g/dL (ref 30.0–36.0)
MCV: 115.2 fL — ABNORMAL HIGH (ref 80.0–100.0)
Monocytes Absolute: 0.4 K/uL (ref 0.1–1.0)
Monocytes Relative: 6 %
Neutro Abs: 5.7 K/uL (ref 1.7–7.7)
Neutrophils Relative %: 86 %
Platelet Count: 84 K/uL — ABNORMAL LOW (ref 150–400)
RBC: 2.82 MIL/uL — ABNORMAL LOW (ref 3.87–5.11)
RDW: 16.8 % — ABNORMAL HIGH (ref 11.5–15.5)
WBC Count: 6.6 K/uL (ref 4.0–10.5)
nRBC: 0 % (ref 0.0–0.2)

## 2024-06-16 LAB — CMP (CANCER CENTER ONLY)
ALT: 20 U/L (ref 0–44)
AST: 39 U/L (ref 15–41)
Albumin: 2.6 g/dL — ABNORMAL LOW (ref 3.5–5.0)
Alkaline Phosphatase: 289 U/L — ABNORMAL HIGH (ref 38–126)
Anion gap: 5 (ref 5–15)
BUN: 9 mg/dL (ref 8–23)
CO2: 26 mmol/L (ref 22–32)
Calcium: 8.3 mg/dL — ABNORMAL LOW (ref 8.9–10.3)
Chloride: 110 mmol/L (ref 98–111)
Creatinine: 0.45 mg/dL (ref 0.44–1.00)
GFR, Estimated: >60 mL/min (ref >60)
Glucose, Bld: 98 mg/dL (ref 70–99)
Potassium: 3.6 mmol/L (ref 3.5–5.1)
Sodium: 141 mmol/L (ref 135–145)
Total Bilirubin: 1.1 mg/dL (ref 0.0–1.2)
Total Protein: 5.2 g/dL — ABNORMAL LOW (ref 6.5–8.1)

## 2024-06-16 MED ORDER — DEXAMETHASONE SODIUM PHOSPHATE 10 MG/ML IJ SOLN
10.0000 mg | Freq: Once | INTRAMUSCULAR | Status: AC
  Administered 2024-06-16: 10 mg via INTRAVENOUS
  Filled 2024-06-16: qty 1

## 2024-06-16 MED ORDER — FOSAPREPITANT DIMEGLUMINE INJECTION 150 MG
150.0000 mg | Freq: Once | INTRAVENOUS | Status: AC
  Administered 2024-06-16: 150 mg via INTRAVENOUS
  Filled 2024-06-16: qty 150

## 2024-06-16 MED ORDER — CETIRIZINE HCL 10 MG/ML IV SOLN
10.0000 mg | Freq: Once | INTRAVENOUS | Status: AC
  Administered 2024-06-16: 10 mg via INTRAVENOUS
  Filled 2024-06-16: qty 1

## 2024-06-16 MED ORDER — PALONOSETRON HCL INJECTION 0.25 MG/5ML
0.2500 mg | Freq: Once | INTRAVENOUS | Status: AC
  Administered 2024-06-16: 0.25 mg via INTRAVENOUS
  Filled 2024-06-16: qty 5

## 2024-06-16 MED ORDER — DEXTROSE 5 % IV SOLN
4.0000 mg/kg | Freq: Once | INTRAVENOUS | Status: AC
  Administered 2024-06-16: 330 mg via INTRAVENOUS
  Filled 2024-06-16: qty 16.5

## 2024-06-16 MED ORDER — ACETAMINOPHEN 325 MG PO TABS
650.0000 mg | ORAL_TABLET | Freq: Once | ORAL | Status: AC
  Administered 2024-06-16: 650 mg via ORAL
  Filled 2024-06-16: qty 2

## 2024-06-16 MED ORDER — SODIUM CHLORIDE 0.9% FLUSH
10.0000 mL | Freq: Once | INTRAVENOUS | Status: AC
  Administered 2024-06-16: 10 mL

## 2024-06-16 MED ORDER — DEXTROSE 5 % IV SOLN: INTRAVENOUS | Status: DC

## 2024-06-16 MED ORDER — FAMOTIDINE IN NACL 20-0.9 MG/50ML-% IV SOLN
20.0000 mg | Freq: Once | INTRAVENOUS | Status: AC
  Administered 2024-06-16: 20 mg via INTRAVENOUS
  Filled 2024-06-16: qty 50

## 2024-06-16 MED ORDER — DIPHENHYDRAMINE HCL 25 MG PO CAPS
25.0000 mg | ORAL_CAPSULE | Freq: Once | ORAL | Status: AC
  Administered 2024-06-16: 25 mg via ORAL
  Filled 2024-06-16: qty 1

## 2024-06-16 NOTE — Progress Notes (Signed)
 Patient waited her 30 minute post infusion with no issues ambulatory to lobby

## 2024-06-16 NOTE — Progress Notes (Signed)
 Nutrition Follow-up:  Patient with metastatic breast cancer.  Receiving datroway , followed by Dr Odean  Met with patient during infusion.  Reports that she has mouth sores present but they are not painful.  She ate 2 eggs this am, usually has english muffin or toast but did not have time this am.  Lunch is usually soup or hamburger helper.  Likes jello.  Adds carnation breakfast essentials to coffee.  Drinks ensure complete at least daily.  Dinner last night was apple pie.  Compazine  helps nausea.      Medications: reviewed  Labs: reviewed  Anthropometrics:   Weight 187 lb 1.6 oz  181 lb 14.4 oz on 6/23 186 lb 12.8 oz on 5/20 191 lb 1.6 oz on 4/22   NUTRITION DIAGNOSIS: Unintentional weight loss improved    INTERVENTION:  Discussed foods high in protein Encouraged protein food at every meal.  Continue ensure complete (350 calories, 30 g protein)     MONITORING, EVALUATION, GOAL: weight trends, intake   NEXT VISIT: as needed  Danaye Sobh B. Dasie SOLON, CSO, LDN Registered Dietitian [phone]

## 2024-06-16 NOTE — Progress Notes (Signed)
 Patient Care Team: Billy Philippe SAUNDERS, NP as PCP - General (Family Medicine) Zenaida Morene PARAS, MD as PCP - Cardiology (Cardiology) Izell Domino, MD as Attending Physician (Radiation Oncology) Crawford, Morna Pickle, NP as Nurse Practitioner (Hematology and Oncology) Signe Mitzie LABOR, MD as Consulting Physician (General Surgery) Janit Thresa HERO, DPM as Consulting Physician (Podiatry) Lennard Lesta FALCON, MD as Consulting Physician (Gastroenterology) Odean Potts, MD as Attending Physician (Hematology and Oncology)  DIAGNOSIS:  Encounter Diagnosis  Name Primary?   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) Yes    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)   06/21/2022 - 08/03/2022 Chemotherapy   Patient is on Treatment Plan : BREAST METASTATIC fam-trastuzumab deruxtecan-nxki  (Enhertu ) q21d     06/21/2022 - 04/19/2023 Chemotherapy   Patient is on Treatment Plan : BREAST METASTATIC Fam-Trastuzumab Deruxtecan-nxki  (Enhertu ) (5.4) q21d     05/18/2023 - 07/06/2023 Chemotherapy   Patient is on Treatment Plan : BREAST METASTATIC Sacituzumab govitecan -hziy (Trodelvy ) D1,8 q21d     10/16/2023 - 01/05/2024 Chemotherapy   Patient is on Treatment Plan : BREAST METASTATIC Eribulin  D1,8 q21d     01/21/2024 - 03/17/2024 Chemotherapy   Patient is on Treatment Plan : BREAST Liposomal Doxorubicin  (50) q28d     04/15/2024 -  Chemotherapy   Patient is on Treatment Plan : BREAST Datopotamab Deruxtecan-dlnk  (Datroway ) q21d     Malignant neoplasm of upper-outer quadrant of right breast in female, estrogen receptor positive (HCC)  06/21/2016 Initial Diagnosis   Malignant neoplasm of upper-outer quadrant of right breast in female, estrogen receptor positive (HCC)   06/21/2016 Initial Biopsy   Right breast biopsy, 10 oclock: IDC, grade 3,  ER+(95%), PR+(80%),Ki67 20%, HER-2 negative (ratio 0.67). Right axilla core biopsy: carcinoma, grade 3, ER+(100%), PR+(90%), Ki67 25%, HER-2 negative (ratio 1.13).    07/17/2016 - 11/06/2016 Neo-Adjuvant Chemotherapy   Received 2 cycles of Doxorubicin  and Cyclophosphamide , then transitioned to weekly Paclitaxel  (due to repeated febrile neutropenia) x 7 cycles, stopped early due to peripheral neuropathy, then completed her final 2 cycles of Doxorubicin  and Cyclophosphamide .    12/19/2016 Surgery   Right breast lumpectomy ([facility]): IDC, grade 2, 1.6cm and 0.3cm, margins negative, 3 SLN positive for metastatic carcinoma.     12/26/2016 Surgery   ALND: metastatic carcinoma in one of 20 lymph nodes, and three nodes from previous lumpectomy.  Four positive nodes, consistent with pN2a.   02/07/2017 - 03/21/2017 Radiation Therapy   Adjuvant radiation [facility]): 1) Right breast and nodes - 4 field: 50 Gy in 25 fractions. IM NODES: >95% receive at least 45Gy/63fx. 50Gy to SCLV/PAB @ 2Gy /fraction x 25 fractions. 2) Right breast boost: 10 Gy in 5 fractions   04/2017 -  Anti-estrogen oral therapy   Anastrozole  1mg  daily.  Bone density 03/23/2017 finds T score of -2.6, osteoporosis, plan to start Prolia  following dental clearance Anastrozole  stopped 01/16/18 Exemestane  25 mg daily 02/12/18  On [name], trial randomized to endocrine therapy alone   05/27/2020 Progression   chest CT scan 05/27/2020 shows bulky mediastinal and right hilar lymphadenopathy with right pleural nodules and a small right pleural effusion, no evidence of liver or bone involvement             (a) biopsy of right breast mass 06/02/2020 shows invasive ductal carcinoma, estrogen and progesterone receptor positive, HER-2 not amplified, with an MIB-1 of  40%   06/17/2020 Treatment Plan Change    fulvestrant  to start 06/17/2020             (a) palbociclib  to start 06/17/2020 at 125 mg daily 21 days on 7 days off             (b) palbociclib   decreased to 125mg  every other day x 11 doses on 07/23/2020             (c) palbociclib  dose decreased to100 mg daily, 21/7, starting with September cycle             (d) Palbociclib  decreased to 75mg  daily beginning with March cycle due to oral ulcers             (e) palbociclib  discontinued November 2022 with progression   09/14/2021 PET scan   PET scan 09/14/2021 shows progression in liver and bone   10/05/2021 Pathology Results   liver biopsy 10/05/2021 confirms metastatic carcinoma, estrogen receptor strongly positive, progesterone receptor and HER2 negative, with an MIB-1 of 30%.   10/2021 Treatment Plan Change   fulvestrant  continued, Xgeva  added, palbociclib  changed to abemaciclib  November 2022 -Dose Decreased to 50mg  PO BID Daily   03/07/2022 Miscellaneous   [facility] molecular testing: ER positive, ER positive, MTAP: Detected, ESR 1 negative, BRCA 1 and 2 negative, MSI stable, PD-L1 negative, PIK 3 CA negative, PR negative,   06/21/2022 - 08/03/2022 Chemotherapy   Patient is on Treatment Plan : BREAST METASTATIC fam-trastuzumab deruxtecan-nxki  (Enhertu ) q21d     06/21/2022 - 04/19/2023 Chemotherapy   Patient is on Treatment Plan : BREAST METASTATIC Fam-Trastuzumab Deruxtecan-nxki  (Enhertu ) (5.4) q21d     01/25/2023 Cancer Staging   Staging form: Breast, AJCC 7th Edition - Pathologic: Stage IV (M1) - Signed by Crawford Morna Pickle, NP on 01/25/2023   05/18/2023 - 07/06/2023 Chemotherapy   Patient is on Treatment Plan : BREAST METASTATIC Sacituzumab govitecan -hziy (Trodelvy ) D1,8 q21d     07/23/2023 -  Chemotherapy   Xeloda  1000 mg bid 14 days on and 7 days off   08/27/2023 Procedure   Y90 hepatic radio embolization   10/16/2023 - 01/05/2024 Chemotherapy   Patient is on Treatment Plan : BREAST METASTATIC Eribulin  D1,8 q21d     01/21/2024 - 03/17/2024 Chemotherapy   Patient is on Treatment Plan : BREAST Liposomal Doxorubicin  (50) q28d     04/15/2024 -  Chemotherapy   Patient is on  Treatment Plan : BREAST Datopotamab Deruxtecan-dlnk  (Datroway ) q21d     Breast cancer, stage 4 (HCC)  03/09/2022 Initial Diagnosis   Breast cancer, stage 4 (HCC)   06/21/2022 - 08/03/2022 Chemotherapy   Patient is on Treatment Plan : BREAST METASTATIC fam-trastuzumab deruxtecan-nxki  (Enhertu ) q21d     06/21/2022 - 04/19/2023 Chemotherapy   Patient is on Treatment Plan : BREAST METASTATIC Fam-Trastuzumab Deruxtecan-nxki  (Enhertu ) (5.4) q21d     05/18/2023 - 07/06/2023 Chemotherapy   Patient is on Treatment Plan : BREAST METASTATIC Sacituzumab govitecan -hziy (Trodelvy ) D1,8 q21d     10/16/2023 - 01/05/2024 Chemotherapy   Patient is on Treatment Plan : BREAST METASTATIC Eribulin  D1,8 q21d     01/21/2024 - 03/17/2024 Chemotherapy   Patient is on Treatment Plan : BREAST Liposomal Doxorubicin  (50) q28d     04/15/2024 -  Chemotherapy   Patient is on Treatment Plan : BREAST Datopotamab Deruxtecan-dlnk  (Datroway ) q21d       CHIEF COMPLIANT: Cycle 4 datopotamab deruxtecan  HISTORY OF PRESENT ILLNESS:  History of Present Illness April [name] is a 63  year old female with metastatic breast cancer who presents with nausea and fatigue related to chemotherapy. Nausea occurs infrequently and is managed with Zofran . Fatigue is significant, though hemoglobin levels have improved to 11.3. Nutrition is suboptimal, with an albumin  level of 2.6, and she consumed only two eggs today. Her weight has increased slightly from 185 to 187 pounds. Oral mucositis is present, with sores in her mouth less severe than in previous cycles. She uses a mouthwash effectively and has not needed morphine  recently. She has added vitamin D  to her regimen, taking four tablets. Kidney function and liver enzymes remain stable.     ALLERGIES:  is allergic to datopotamab deruxtecan, eribulin  mesylate, codeine , cymbalta  [duloxetine  hcl], hydrocodone , ultram  [tramadol  hcl], and venlafaxine .  MEDICATIONS:  Current Outpatient Medications   Medication Sig Dispense Refill   buPROPion  (WELLBUTRIN  XL) 150 MG 24 hr tablet Take 1 tablet (150 mg total) by mouth daily. 90 tablet 1   Carboxymethylcell-Glycerin  PF 0.5-0.9 % SOLN Place 2 drops into both eyes 4 (four) times daily. And as needed. 1 each 11   cromolyn (OPTICROM) 4 % ophthalmic solution SMARTSIG:In Eye(s)     cyclobenzaprine  (FLEXERIL ) 5 MG tablet Take 1 tablet (5 mg total) by mouth 3 (three) times daily as needed for muscle spasms. 30 tablet 0   Denosumab  (XGEVA  [place]) Inject 120 mg into the skin every 3 (three) months.     dexamethasone  (DECADRON ) 0.5 MG/5ML solution Swish 10 mL (1 mg) by mouth for 2 minutes, then spit. Repeat 4 times daily. Start on 1st day of datopotamab deruxtecan. 500 mL 4   dexamethasone  (DECADRON ) 0.5 MG/5ML solution Swish 10 mLs (1 mg total) for 2 minutes, then spit. Repeat 4 (four) times daily. Start on 1st day of Datopotamab 500 mL 4   KLOR-CON  M10 10 MEQ tablet TAKE 1 TABLET BY MOUTH EVERY DAY 90 tablet 1   loratadine  (CLARITIN ) 10 MG tablet Take 10 mg by mouth daily.     magic mouthwash (nystatin , lidocaine , diphenhydrAMINE , alum & mag hydroxide) suspension Take 5 mLs by mouth 4 (four) times daily as needed for mouth pain. Suspension contains equal amounts of Maalox Extra Strength, nystatin , diphenhydramine  and lidocaine . 240 mL 2   Morphine  Sulfate (MORPHINE  CONCENTRATE) 10 mg / 0.5 ml concentrated solution Take 0.5 mLs (10 mg total) by mouth every 2 (two) hours as needed. 120 mL 0   naproxen sodium (ALEVE) 220 MG tablet Take 440-660 mg by mouth 2 (two) times daily as needed (pain).     omeprazole  (PRILOSEC) 20 MG capsule Take 20 mg by mouth daily.     ondansetron  (ZOFRAN ) 8 MG tablet Take 1 tablet (8 mg total) by mouth every 8 (eight) hours as needed for nausea or vomiting. Start on the third day after chemotherapy. 30 tablet 1   prochlorperazine  (COMPAZINE ) 10 MG tablet Take 1 tablet (10 mg total) by mouth every 6 (six) hours as needed for nausea or  vomiting. 30 tablet 1   rizatriptan  (MAXALT ) 5 MG tablet Take 1 tablet (5 mg total) by mouth as needed for migraine. May repeat in 2 hours if needed 10 tablet 0   valACYclovir  (VALTREX ) 1000 MG tablet Take 1 tablet (1,000 mg total) by mouth 2 (two) times daily. 180 tablet 4   XIIDRA 5 % SOLN      fluconazole  (DIFLUCAN ) 200 MG tablet Take 1 tablet (200 mg total) by mouth daily. (Patient not taking: Reported on 06/16/2024) 30 tablet 0   No current facility-administered medications  for this visit.    PHYSICAL EXAMINATION: ECOG PERFORMANCE STATUS: 1 - Symptomatic but completely ambulatory  Vitals:   06/16/24 1200 06/16/24 1204  BP: 102/68 (!) 96/56  Pulse:  93  Resp:  17  Temp:  98.1 F (36.7 C)  SpO2:  99%   Filed Weights   06/16/24 1204  Weight: 187 lb 1.6 oz (84.9 kg)    Physical Exam MEASUREMENTS: Weight- 187.  (exam performed in the presence of a chaperone)  LABORATORY DATA:  I have reviewed the data as listed    Latest Ref Rng & Units 06/16/2024   11:33 AM 05/26/2024   10:34 AM 05/05/2024    9:28 AM  CMP  Glucose 70 - 99 mg/dL 98  893  98   BUN 8 - 23 mg/dL 9  16  9    Creatinine 0.44 - 1.00 mg/dL 9.54  9.58  9.50   Sodium 135 - 145 mmol/L 141  139  138   Potassium 3.5 - 5.1 mmol/L 3.6  3.4  3.1   Chloride 98 - 111 mmol/L 110  109  105   CO2 22 - 32 mmol/L 26  25  27    Calcium 8.9 - 10.3 mg/dL 8.3  8.4  8.4   Total Protein 6.5 - 8.1 g/dL 5.2  5.6  5.7   Total Bilirubin 0.0 - 1.2 mg/dL 1.1  1.0  1.6   Alkaline Phos 38 - 126 U/L 289  293  349   AST 15 - 41 U/L 39  37  41   ALT 0 - 44 U/L 20  22  29      Lab Results  Component Value Date   WBC 6.6 06/16/2024   HGB 11.3 (L) 06/16/2024   HCT 32.5 (L) 06/16/2024   MCV 115.2 (H) 06/16/2024   PLT 84 (L) 06/16/2024   NEUTROABS 5.7 06/16/2024    ASSESSMENT & PLAN:  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) 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  (ER +95%, PR +95%, HER2 negative null score 0, MSI: Stable, TMB 5, genomic LOH: High   CT CAP: Liver mets 2.9 cm (was 1.7 cm), 2.5 cm (was 2.7 cm) others stable, enl sub pleural nodule 0.8 cm, increase in Left supraclav LN and pe vascular LN), stable bone mets  -------------------------------------------------------- Current treatment: Datopotamab deruxtecan cycle 4 Echocardiogram 04/09/2024: EF 60 to 65%  Patient had an eye exam to make sure she can safely receive Datroway  Toxicities: Jitteriness: Possibly due to Decadron .  Oral Decadron  discontinued Nausea: Alternating Compazine  and Zofran  which is helping Cytopenias: With the Granix  injection her white count has stabilized.  Dehydration: Much improved after the break in treatment Oral mucositis: Mild and improved because of the steroid mouthwash.  There is a sore on the tongue that is not improving. Profound fatigue   Return to clinic in 3 weeks for cycle 5 Recommended obtaining CT scans to assess response to treatment after 6 cycles. ------------------------------------- Assessment and Plan Assessment & Plan Cancer treatment monitoring Tolerating treatment well with improved hemoglobin and stable renal  and hepatic function. - Plan scan after six treatment rounds if well-managed. - Schedule follow-up appointments as needed.  Fatigue due to cancer treatment Significant fatigue likely related to treatment, not anemia, as hemoglobin is 11.3 and platelets are above 75. - Encourage improved  nutritional intake, focusing on protein sources.  Mouth sores due to cancer treatment Mouth sores improving, less severe than previous cycles. Mouthwash effective, morphine  not needed. - Continue using magic mouthwash as needed.  Protein-calorie malnutrition Suboptimal nutritional status with albumin  at 2.6, indicating protein-calorie malnutrition. Weight stable at 187 pounds, up from 185 pounds. - Consult with a nutritionist to improve dietary intake, focusing on protein-rich foods. - Encourage increased protein consumption to improve albumin  levels.      No orders of the defined types were placed in this encounter.  The patient has a good understanding of the overall plan. she agrees with it. she will call with any problems that may develop before the next visit here. Total time spent: 30 mins including face to face time and time spent for planning, charting and co-ordination of care   Viinay K Marialena Wollen, MD 06/16/24

## 2024-06-16 NOTE — Assessment & Plan Note (Signed)
 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  (ER +95%, PR +95%, HER2 negative null score 0, MSI: Stable, TMB 5, genomic LOH: High   CT CAP: Liver mets 2.9 cm (was 1.7 cm), 2.5 cm (was 2.7 cm) others stable, enl sub pleural nodule 0.8 cm, increase in Left supraclav LN and pe vascular LN), stable bone mets  -------------------------------------------------------- Current treatment: Datopotamab deruxtecan cycle 4 Echocardiogram 04/09/2024: EF 60 to 65%  Patient had an eye exam to make sure she can safely receive Datroway  Toxicities: Jitteriness: Possibly due to Decadron .  Oral Decadron  discontinued Nausea: Alternating Compazine  and Zofran  which is helping Cytopenias: With the Granix  injection her white count has stabilized.  Dehydration: Much improved after the break in treatment Oral mucositis: Mild and improved because of the steroid mouthwash.  There is a sore on the tongue that is not improving. Profound fatigue   Return to clinic in 3 weeks for cycle 5 Recommended obtaining CT scans to assess response to treatment before the next treatment cycle

## 2024-06-17 ENCOUNTER — Other Ambulatory Visit (HOSPITAL_COMMUNITY)
Admission: RE | Admit: 2024-06-17 | Discharge: 2024-06-17 | Disposition: A | Discharge status: Home / Self Care | Source: Ambulatory Visit | Attending: Obstetrics and Gynecology | Admitting: Obstetrics and Gynecology

## 2024-06-17 ENCOUNTER — Ambulatory Visit: Admitting: Obstetrics and Gynecology

## 2024-06-17 ENCOUNTER — Encounter: Payer: Self-pay | Admitting: Obstetrics and Gynecology

## 2024-06-17 VITALS — BP 96/62 | HR 105 | Temp 97.8°F | Ht 63.0 in | Wt 190.0 lb

## 2024-06-17 DIAGNOSIS — Z01419 Encounter for gynecological examination (general) (routine) without abnormal findings: Secondary | ICD-10-CM

## 2024-06-17 DIAGNOSIS — F32A Depression, unspecified: Secondary | ICD-10-CM

## 2024-06-17 DIAGNOSIS — Z124 Encounter for screening for malignant neoplasm of cervix: Secondary | ICD-10-CM | POA: Diagnosis present

## 2024-06-17 DIAGNOSIS — C50411 Malignant neoplasm of upper-outer quadrant of right female breast: Secondary | ICD-10-CM

## 2024-06-17 DIAGNOSIS — Z170 Estrogen receptor positive status [ER+]: Secondary | ICD-10-CM | POA: Diagnosis not present

## 2024-06-17 DIAGNOSIS — Z1331 Encounter for screening for depression: Secondary | ICD-10-CM | POA: Diagnosis not present
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
## 2024-06-17 NOTE — Patient Instructions (Signed)
 For patients under 50-62yo, I recommend 1200mg  calcium daily and 600IU of vitamin D daily. For patients over 62yo, I recommend 1200mg  calcium daily and 800IU of vitamin D daily.  Health Maintenance, Female Adopting a healthy lifestyle and getting preventive care are important in promoting health and wellness. Ask your health care provider about: The right schedule for you to have regular tests and exams. Things you can do on your own to prevent diseases and keep yourself healthy. What should I know about diet, weight, and exercise? Eat a healthy diet  Eat a diet that includes plenty of vegetables, fruits, low-fat dairy products, and lean protein. Do not eat a lot of foods that are high in solid fats, added sugars, or sodium. Maintain a healthy weight Body mass index (BMI) is used to identify weight problems. It estimates body fat based on height and weight. Your health care provider can help determine your BMI and help you achieve or maintain a healthy weight. Get regular exercise Get regular exercise. This is one of the most important things you can do for your health. Most adults should: Exercise for at least 150 minutes each week. The exercise should increase your heart rate and make you sweat (moderate-intensity exercise). Do strengthening exercises at least twice a week. This is in addition to the moderate-intensity exercise. Spend less time sitting. Even light physical activity can be beneficial. Watch cholesterol and blood lipids Have your blood tested for lipids and cholesterol at 62 years of age, then have this test every 5 years. Have your cholesterol levels checked more often if: Your lipid or cholesterol levels are high. You are older than 62 years of age. You are at high risk for heart disease. What should I know about cancer screening? Depending on your health history and family history, you may need to have cancer screening at various ages. This may include screening  for: Breast cancer. Cervical cancer. Colorectal cancer. Skin cancer. Lung cancer. What should I know about heart disease, diabetes, and high blood pressure? Blood pressure and heart disease High blood pressure causes heart disease and increases the risk of stroke. This is more likely to develop in people who have high blood pressure readings or are overweight. Have your blood pressure checked: Every 3-5 years if you are 31-23 years of age. Every year if you are 44 years old or older. Diabetes Have regular diabetes screenings. This checks your fasting blood sugar level. Have the screening done: Once every three years after age 62 if you are at a normal weight and have a low risk for diabetes. More often and at a younger age if you are overweight or have a high risk for diabetes. What should I know about preventing infection? Hepatitis B If you have a higher risk for hepatitis B, you should be screened for this virus. Talk with your health care provider to find out if you are at risk for hepatitis B infection. Hepatitis C Testing is recommended for: Everyone born from [birth year] through 1964. Anyone with known risk factors for hepatitis C. Sexually transmitted infections (STIs) Get screened for STIs, including gonorrhea and chlamydia, if: You are sexually active and are younger than 62 years of age. You are older than 62 years of age and your health care provider tells you that you are at risk for this type of infection. Your sexual activity has changed since you were last screened, and you are at increased risk for chlamydia or gonorrhea. Ask your health care provider if  you are at risk. Ask your health care provider about whether you are at high risk for HIV. Your health care provider may recommend a prescription medicine to help prevent HIV infection. If you choose to take medicine to prevent HIV, you should first get tested for HIV. You should then be tested every 3 months for as long as you  are taking the medicine. Osteoporosis and menopause Osteoporosis is a disease in which the bones lose minerals and strength with aging. This can result in bone fractures. If you are 4 years old or older, or if you are at risk for osteoporosis and fractures, ask your health care provider if you should: Be screened for bone loss. Take a calcium or vitamin D supplement to lower your risk of fractures. Be given hormone replacement therapy (HRT) to treat symptoms of menopause. Follow these instructions at home: Alcohol use Do not drink alcohol if: Your health care provider tells you not to drink. You are pregnant, may be pregnant, or are planning to become pregnant. If you drink alcohol: Limit how much you have to: 0-1 drink a day. Know how much alcohol is in your drink. In the [place] one drink equals one 12 oz bottle of beer (355 mL), one 5 oz glass of wine (148 mL), or one 1 oz glass of hard liquor (44 mL). Lifestyle Do not use any products that contain nicotine or tobacco. These products include cigarettes, chewing tobacco, and vaping devices, such as e-cigarettes. If you need help quitting, ask your health care provider. Do not use street drugs. Do not share needles. Ask your health care provider for help if you need support or information about quitting drugs. General instructions Schedule regular health, dental, and eye exams. Stay current with your vaccines. Tell your health care provider if: You often feel depressed. You have ever been abused or do not feel safe at home. Summary Adopting a healthy lifestyle and getting preventive care are important in promoting health and wellness. Follow your health care provider's instructions about healthy diet, exercising, and getting tested or screened for diseases. Follow your health care provider's instructions on monitoring your cholesterol and blood pressure. This information is not intended to replace advice given to you by your health  care provider. Make sure you discuss any questions you have with your health care provider. Document Revised: 04/11/2021 Document Reviewed: 04/11/2021 Elsevier Patient Education  2024 [employer].

## 2024-06-18 ENCOUNTER — Other Ambulatory Visit: Payer: Self-pay

## 2024-06-24 ENCOUNTER — Telehealth: Payer: Self-pay | Admitting: Pharmacy Technician

## 2024-06-24 DIAGNOSIS — Z01419 Encounter for gynecological examination (general) (routine) without abnormal findings: Secondary | ICD-10-CM | POA: Insufficient documentation

## 2024-06-24 NOTE — Telephone Encounter (Signed)
 Correction This was her 84 month follow up.  Crystal Keven BS [facility]  Clinical Research Specialist II Oncology Services  Direct Dial: [phone] Fax: [phone]   06/24/2024 1:33 PM

## 2024-06-24 NOTE — Assessment & Plan Note (Signed)
 Cervical cancer screening performed according to ASCCP guidelines. Stage IV breast cancer, follows with Dr. Odean, MMG no longer recommended Labs and immunizations with her primary Encouraged safe sexual practices as indicated Encouraged healthy lifestyle practices with diet and exercise For patients under 50-62yo, I recommend 1200mg  calcium daily and 600IU of vitamin D  daily.

## 2024-06-24 NOTE — Telephone Encounter (Signed)
 [name]: PALBOCICLIB  COLLABORATIVE ADJUVANT STUDY: A RANDOMIZED PHASE III TRIAL OF PALBOCICLIB  WITH STANDARD ADJUVANT ENDOCRINE THERAPY VERSUS STANDARD ADJUVANT ENDOCRINE THERAPY ALONE FOR HORMONE RECEPTOR POSITIVE (HR+)/HUMAN EPIDERMAL GROWTH FACTOR RECEPTOR 2 (HER2)-NEGATIVE EARLY BREAST CANCER    AFT-05/[name] - month 72 follow up visit-  The patient conducted annual follow up via phone.     Anti-cancer medications - Patient states they contine  Datroway  treatment that was started in May.  Serious adverse events - Patient denies any new, serious health problems since last visit. No cardiac events(heart attack, stroke, blood clots) in the past year.    Disease monitoring - The pt saw Dr. Odean on 06/16/24 and will see him again on 07/05/24.   COVID-19 - Patients reports no positive COVID-19 infections in the last year.  They have not had any COVID-19 vaccines in the last year.    Crystal Keven BS [facility]  Clinical Research Specialist II Oncology Services  Direct Dial: [phone] Fax: [phone]   06/24/2024 1:13 PM

## 2024-06-26 ENCOUNTER — Ambulatory Visit: Payer: Self-pay | Admitting: Obstetrics and Gynecology

## 2024-06-26 DIAGNOSIS — R87611 Atypical squamous cells cannot exclude high grade squamous intraepithelial lesion on cytologic smear of cervix (ASC-H): Secondary | ICD-10-CM

## 2024-06-26 DIAGNOSIS — R87619 Unspecified abnormal cytological findings in specimens from cervix uteri: Secondary | ICD-10-CM

## 2024-06-26 LAB — CYTOLOGY - PAP
Comment: NEGATIVE
Diagnosis: HIGH — AB
High risk HPV: NEGATIVE

## 2024-07-04 ENCOUNTER — Encounter: Payer: Self-pay | Admitting: Hematology and Oncology

## 2024-07-04 MED FILL — Fosaprepitant Dimeglumine For IV Infusion 150 MG (Base Eq): INTRAVENOUS | Qty: 5 | Status: AC

## 2024-07-05 ENCOUNTER — Inpatient Hospital Stay: Attending: Oncology

## 2024-07-05 VITALS — BP 110/74 | HR 99 | Temp 97.0°F | Resp 20

## 2024-07-05 DIAGNOSIS — Z5111 Encounter for antineoplastic chemotherapy: Secondary | ICD-10-CM | POA: Diagnosis present

## 2024-07-05 DIAGNOSIS — Z170 Estrogen receptor positive status [ER+]: Secondary | ICD-10-CM | POA: Diagnosis not present

## 2024-07-05 DIAGNOSIS — Z87891 Personal history of nicotine dependence: Secondary | ICD-10-CM | POA: Diagnosis not present

## 2024-07-05 DIAGNOSIS — Z923 Personal history of irradiation: Secondary | ICD-10-CM | POA: Diagnosis not present

## 2024-07-05 DIAGNOSIS — G620 Drug-induced polyneuropathy: Secondary | ICD-10-CM | POA: Insufficient documentation

## 2024-07-05 DIAGNOSIS — C773 Secondary and unspecified malignant neoplasm of axilla and upper limb lymph nodes: Secondary | ICD-10-CM | POA: Insufficient documentation

## 2024-07-05 DIAGNOSIS — D61818 Other pancytopenia: Secondary | ICD-10-CM | POA: Diagnosis not present

## 2024-07-05 DIAGNOSIS — Z8052 Family history of malignant neoplasm of bladder: Secondary | ICD-10-CM | POA: Diagnosis not present

## 2024-07-05 DIAGNOSIS — Z1732 Human epidermal growth factor receptor 2 negative status: Secondary | ICD-10-CM | POA: Diagnosis not present

## 2024-07-05 DIAGNOSIS — R5383 Other fatigue: Secondary | ICD-10-CM | POA: Insufficient documentation

## 2024-07-05 DIAGNOSIS — Z79811 Long term (current) use of aromatase inhibitors: Secondary | ICD-10-CM | POA: Diagnosis not present

## 2024-07-05 DIAGNOSIS — A6000 Herpesviral infection of urogenital system, unspecified: Secondary | ICD-10-CM | POA: Diagnosis not present

## 2024-07-05 DIAGNOSIS — E7800 Pure hypercholesterolemia, unspecified: Secondary | ICD-10-CM | POA: Insufficient documentation

## 2024-07-05 DIAGNOSIS — C50411 Malignant neoplasm of upper-outer quadrant of right female breast: Secondary | ICD-10-CM | POA: Diagnosis not present

## 2024-07-05 DIAGNOSIS — Z1721 Progesterone receptor positive status: Secondary | ICD-10-CM | POA: Diagnosis not present

## 2024-07-05 DIAGNOSIS — K1230 Oral mucositis (ulcerative), unspecified: Secondary | ICD-10-CM | POA: Insufficient documentation

## 2024-07-05 DIAGNOSIS — G629 Polyneuropathy, unspecified: Secondary | ICD-10-CM | POA: Insufficient documentation

## 2024-07-05 DIAGNOSIS — R110 Nausea: Secondary | ICD-10-CM | POA: Diagnosis not present

## 2024-07-05 DIAGNOSIS — K219 Gastro-esophageal reflux disease without esophagitis: Secondary | ICD-10-CM | POA: Insufficient documentation

## 2024-07-05 DIAGNOSIS — Z79899 Other long term (current) drug therapy: Secondary | ICD-10-CM | POA: Diagnosis not present

## 2024-07-05 DIAGNOSIS — M810 Age-related osteoporosis without current pathological fracture: Secondary | ICD-10-CM | POA: Diagnosis not present

## 2024-07-05 DIAGNOSIS — M797 Fibromyalgia: Secondary | ICD-10-CM | POA: Insufficient documentation

## 2024-07-05 DIAGNOSIS — C50911 Malignant neoplasm of unspecified site of right female breast: Secondary | ICD-10-CM

## 2024-07-05 DIAGNOSIS — Z9221 Personal history of antineoplastic chemotherapy: Secondary | ICD-10-CM | POA: Diagnosis not present

## 2024-07-05 MED ORDER — FILGRASTIM-SNDZ 480 MCG/0.8ML IJ SOSY
480.0000 ug | PREFILLED_SYRINGE | Freq: Once | INTRAMUSCULAR | Status: AC
  Administered 2024-07-05: 480 ug via SUBCUTANEOUS

## 2024-07-07 ENCOUNTER — Inpatient Hospital Stay

## 2024-07-07 ENCOUNTER — Inpatient Hospital Stay (HOSPITAL_BASED_OUTPATIENT_CLINIC_OR_DEPARTMENT_OTHER): Admitting: Adult Health

## 2024-07-07 ENCOUNTER — Encounter: Payer: Self-pay | Admitting: Adult Health

## 2024-07-07 VITALS — BP 110/69 | HR 105 | Temp 97.4°F | Resp 18 | Ht 63.0 in | Wt 186.9 lb

## 2024-07-07 DIAGNOSIS — C50911 Malignant neoplasm of unspecified site of right female breast: Secondary | ICD-10-CM

## 2024-07-07 DIAGNOSIS — Z170 Estrogen receptor positive status [ER+]: Secondary | ICD-10-CM

## 2024-07-07 DIAGNOSIS — C50411 Malignant neoplasm of upper-outer quadrant of right female breast: Secondary | ICD-10-CM | POA: Diagnosis not present

## 2024-07-07 DIAGNOSIS — C50919 Malignant neoplasm of unspecified site of unspecified female breast: Secondary | ICD-10-CM

## 2024-07-07 DIAGNOSIS — Z5111 Encounter for antineoplastic chemotherapy: Secondary | ICD-10-CM | POA: Diagnosis not present

## 2024-07-07 LAB — CMP (CANCER CENTER ONLY)
ALT: 26 U/L (ref 0–44)
AST: 50 U/L — ABNORMAL HIGH (ref 15–41)
Albumin: 2.9 g/dL — ABNORMAL LOW (ref 3.5–5.0)
Alkaline Phosphatase: 368 U/L — ABNORMAL HIGH (ref 38–126)
Anion gap: 4 — ABNORMAL LOW (ref 5–15)
BUN: 11 mg/dL (ref 8–23)
CO2: 26 mmol/L (ref 22–32)
Calcium: 8.4 mg/dL — ABNORMAL LOW (ref 8.9–10.3)
Chloride: 109 mmol/L (ref 98–111)
Creatinine: 0.46 mg/dL (ref 0.44–1.00)
GFR, Estimated: >60 mL/min (ref >60)
Glucose, Bld: 88 mg/dL (ref 70–99)
Potassium: 3.9 mmol/L (ref 3.5–5.1)
Sodium: 139 mmol/L (ref 135–145)
Total Bilirubin: 1 mg/dL (ref 0.0–1.2)
Total Protein: 5.4 g/dL — ABNORMAL LOW (ref 6.5–8.1)

## 2024-07-07 LAB — CBC WITH DIFFERENTIAL (CANCER CENTER ONLY)
Abs Immature Granulocytes: 0.05 K/uL (ref 0.00–0.07)
Basophils Absolute: 0 K/uL (ref 0.0–0.1)
Basophils Relative: 0 %
Eosinophils Absolute: 0.2 K/uL (ref 0.0–0.5)
Eosinophils Relative: 2 %
HCT: 32.3 % — ABNORMAL LOW (ref 36.0–46.0)
Hemoglobin: 11.4 g/dL — ABNORMAL LOW (ref 12.0–15.0)
Immature Granulocytes: 1 %
Lymphocytes Relative: 4 %
Lymphs Abs: 0.3 K/uL — ABNORMAL LOW (ref 0.7–4.0)
MCH: 40.3 pg — ABNORMAL HIGH (ref 26.0–34.0)
MCHC: 35.3 g/dL (ref 30.0–36.0)
MCV: 114.1 fL — ABNORMAL HIGH (ref 80.0–100.0)
Monocytes Absolute: 0.6 K/uL (ref 0.1–1.0)
Monocytes Relative: 8 %
Neutro Abs: 6.8 K/uL (ref 1.7–7.7)
Neutrophils Relative %: 85 %
Platelet Count: 85 K/uL — ABNORMAL LOW (ref 150–400)
RBC: 2.83 MIL/uL — ABNORMAL LOW (ref 3.87–5.11)
RDW: 17.1 % — ABNORMAL HIGH (ref 11.5–15.5)
WBC Count: 8 K/uL (ref 4.0–10.5)
nRBC: 0 % (ref 0.0–0.2)

## 2024-07-07 MED ORDER — DEXTROSE 5 % IV SOLN: INTRAVENOUS | Status: DC

## 2024-07-07 MED ORDER — FOSAPREPITANT DIMEGLUMINE INJECTION 150 MG
150.0000 mg | Freq: Once | INTRAVENOUS | Status: AC
  Administered 2024-07-07: 150 mg via INTRAVENOUS
  Filled 2024-07-07: qty 150

## 2024-07-07 MED ORDER — ACETAMINOPHEN 325 MG PO TABS
650.0000 mg | ORAL_TABLET | Freq: Once | ORAL | Status: AC
  Administered 2024-07-07: 650 mg via ORAL
  Filled 2024-07-07: qty 2

## 2024-07-07 MED ORDER — DEXAMETHASONE SODIUM PHOSPHATE 10 MG/ML IJ SOLN
10.0000 mg | Freq: Once | INTRAMUSCULAR | Status: AC
  Administered 2024-07-07: 10 mg via INTRAVENOUS
  Filled 2024-07-07: qty 1

## 2024-07-07 MED ORDER — FAMOTIDINE IN NACL 20-0.9 MG/50ML-% IV SOLN
20.0000 mg | Freq: Once | INTRAVENOUS | Status: AC
  Administered 2024-07-07: 20 mg via INTRAVENOUS
  Filled 2024-07-07: qty 50

## 2024-07-07 MED ORDER — CETIRIZINE HCL 10 MG/ML IV SOLN
10.0000 mg | Freq: Once | INTRAVENOUS | Status: AC
  Administered 2024-07-07: 10 mg via INTRAVENOUS
  Filled 2024-07-07: qty 1

## 2024-07-07 MED ORDER — SODIUM CHLORIDE 0.9% FLUSH
10.0000 mL | INTRAVENOUS | Status: DC | PRN
Start: 2024-07-07 — End: 2024-07-07

## 2024-07-07 MED ORDER — DIPHENHYDRAMINE HCL 25 MG PO CAPS
25.0000 mg | ORAL_CAPSULE | Freq: Once | ORAL | Status: AC
  Administered 2024-07-07: 25 mg via ORAL
  Filled 2024-07-07: qty 1

## 2024-07-07 MED ORDER — PALONOSETRON HCL INJECTION 0.25 MG/5ML
0.2500 mg | Freq: Once | INTRAVENOUS | Status: AC
  Administered 2024-07-07: 0.25 mg via INTRAVENOUS
  Filled 2024-07-07: qty 5

## 2024-07-07 MED ORDER — DEXTROSE 5 % IV SOLN
4.0000 mg/kg | Freq: Once | INTRAVENOUS | Status: AC
  Administered 2024-07-07: 330 mg via INTRAVENOUS
  Filled 2024-07-07: qty 16.5

## 2024-07-07 MED ORDER — SODIUM CHLORIDE 0.9% FLUSH
10.0000 mL | Freq: Once | INTRAVENOUS | Status: AC
Start: 2024-07-07 — End: 2024-07-07
  Administered 2024-07-07: 10 mL

## 2024-07-07 NOTE — Assessment & Plan Note (Signed)
 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  (ER +95%, PR +95%, HER2 negative null score 0, MSI: Stable, TMB 5, genomic LOH: High   CT CAP: Liver mets 2.9 cm (was 1.7 cm), 2.5 cm (was 2.7 cm) others stable, enl sub pleural nodule 0.8 cm, increase in Left supraclav LN and pe vascular LN), stable bone mets  -------------------------------------------------------- Current treatment: Datopotamab deruxtecan cycle 5 Echocardiogram 04/09/2024: EF 60 to 65%  Patient had an eye exam to make sure she can safely receive Datroway  Toxicities: Nausea: Alternating Compazine  and Zofran   Cytopenias: Granix  pre-treatment Oral mucositis: Dexamethasone  mouth rinse Profound fatigue: energy conservation
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
## 2024-07-07 NOTE — Patient Instructions (Signed)
 [facility] - A DEPT OF [facility]  Discharge Instructions: Thank you for choosing [facility] to provide your oncology and hematology care.   If you have a lab appointment with the Cancer Center, please go directly to the Cancer Center and check in at the registration area.   Wear comfortable clothing and clothing appropriate for easy access to any Portacath or PICC line.   We strive to give you quality time with your provider. You may need to reschedule your appointment if you arrive late (15 or more minutes).  Arriving late affects you and other patients whose appointments are after yours.  Also, if you miss three or more appointments without notifying the office, you may be dismissed from the clinic at the provider's discretion.      For prescription refill requests, have your pharmacy contact our office and allow 72 hours for refills to be completed.    Today you received the following chemotherapy and/or immunotherapy agents: Datroway      To help prevent nausea and vomiting after your treatment, we encourage you to take your nausea medication as directed.  BELOW ARE SYMPTOMS THAT SHOULD BE REPORTED IMMEDIATELY: *FEVER GREATER THAN 100.4 F (38 C) OR HIGHER *CHILLS OR SWEATING *NAUSEA AND VOMITING THAT IS NOT CONTROLLED WITH YOUR NAUSEA MEDICATION *UNUSUAL SHORTNESS OF BREATH *UNUSUAL BRUISING OR BLEEDING *URINARY PROBLEMS (pain or burning when urinating, or frequent urination) *BOWEL PROBLEMS (unusual diarrhea, constipation, pain near the anus) TENDERNESS IN MOUTH AND THROAT WITH OR WITHOUT PRESENCE OF ULCERS (sore throat, sores in mouth, or a toothache) UNUSUAL RASH, SWELLING OR PAIN  UNUSUAL VAGINAL DISCHARGE OR ITCHING   Items with * indicate a potential emergency and should be followed up as soon as possible or go to the Emergency Department if any problems should occur.  Please show the CHEMOTHERAPY ALERT CARD or IMMUNOTHERAPY  ALERT CARD at check-in to the Emergency Department and triage nurse.  Should you have questions after your visit or need to cancel or reschedule your appointment, please contact [facility] - A DEPT OF [facility]  Dept: [phone]  and follow the prompts.  Office hours are 8:00 a.m. to 4:30 p.m. Monday - Friday. Please note that voicemails left after 4:00 p.m. may not be returned until the following business day.  We are closed weekends and major holidays. You have access to a nurse at all times for urgent questions. Please call the main number to the clinic Dept: [phone] and follow the prompts.   For any non-urgent questions, you may also contact your provider using MyChart. We now offer e-Visits for anyone 67 and older to request care online for non-urgent symptoms. For details visit [email or web address].   Also download the MyChart app! Go to the app store, search "MyChart", open the app, select [place], and log in with your MyChart username and password.

## 2024-07-08 LAB — CANCER ANTIGEN 27.29: CA 27.29: 370.7 U/mL — ABNORMAL HIGH (ref 0.0–38.6)

## 2024-07-09 ENCOUNTER — Other Ambulatory Visit (HOSPITAL_COMMUNITY)
Admission: RE | Admit: 2024-07-09 | Discharge: 2024-07-09 | Disposition: A | Discharge status: Home / Self Care | Source: Ambulatory Visit | Attending: Obstetrics and Gynecology | Admitting: Obstetrics and Gynecology

## 2024-07-09 ENCOUNTER — Ambulatory Visit (INDEPENDENT_AMBULATORY_CARE_PROVIDER_SITE_OTHER): Admitting: Obstetrics and Gynecology

## 2024-07-09 ENCOUNTER — Encounter: Payer: Self-pay | Admitting: Obstetrics and Gynecology

## 2024-07-09 VITALS — BP 98/60 | HR 90 | Temp 97.8°F | Wt 189.0 lb

## 2024-07-09 DIAGNOSIS — N888 Other specified noninflammatory disorders of cervix uteri: Secondary | ICD-10-CM | POA: Diagnosis not present

## 2024-07-09 DIAGNOSIS — R87619 Unspecified abnormal cytological findings in specimens from cervix uteri: Secondary | ICD-10-CM | POA: Insufficient documentation

## 2024-07-09 DIAGNOSIS — R87611 Atypical squamous cells cannot exclude high grade squamous intraepithelial lesion on cytologic smear of cervix (ASC-H): Secondary | ICD-10-CM

## 2024-07-09 DIAGNOSIS — N871 Moderate cervical dysplasia: Secondary | ICD-10-CM | POA: Diagnosis not present

## 2024-07-09 MED ORDER — LIDOCAINE HCL (PF) 1 % IJ SOLN: 10.0000 mL | Freq: Once | INTRAMUSCULAR | Status: DC

## 2024-07-09 NOTE — Addendum Note (Signed)
 Addended by: DALLIE BOLLARD V on: 07/09/2024 10:50 AM   Modules accepted: Orders

## 2024-07-09 NOTE — Patient Instructions (Signed)
 It is common to have vaginal bleeding and cramping for up to 72 hours after your biopsy. Please call our office with heavy vaginal bleeding, severe abdominal pain or fever. Avoid intercourse, tampon use, douching and baths for 7 days to decrease the risk of infection.

## 2024-07-09 NOTE — Progress Notes (Signed)
 63 y.o. 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 Odean)  here for colposcopy. Divorced  No LMP recorded. Patient is postmenopausal.    Last PAP:    Component Value Date/Time   DIAGPAP (A) 06/17/2024 1448    - Atypical squamous cells, cannot exclude high grade squamous   DIAGPAP intraepithelial lesion (ASC-H) (A) 06/17/2024 1448   DIAGPAP - Atypical glandular cells, NOS (A) 06/17/2024 1448   HPVHIGH Negative 06/17/2024 1448   ADEQPAP  06/17/2024 1448    Satisfactory for evaluation. The presence or absence of an   ADEQPAP  06/17/2024 1448    endocervical/transformation zone component cannot be determined because   ADEQPAP of atrophy. 06/17/2024 1448   GYN HISTORY: Stage IV Breast cancer   OB History  Gravida Para Term Preterm AB Living  3 1 1  2 1   SAB IAB Ectopic Multiple Live Births   2   1    # Outcome Date GA Lbr Len/2nd Weight Sex Type Anes PTL Lv  3 IAB           2 IAB           1 Term      Vag-Spont   LIV    Past Medical History:  Diagnosis Date   Allergy 07/28/2012   Seasonal/Environmental allergies   Anxiety 2013   Since 2013   Arthritis 2014 per patient    knees and shoulders   Bilateral ankle fractures 07/2015   Booted and resolved    Cancer [facility]) dx June 22, 2016   right breast   Depression 2013   Multiple  episodes  in past.   Elevated cholesterol 2017   Fibromyalgia 2013   diagnosed by Dr. Dolphus   Genital herpes 2005   Has outbreaks monthly if not on preventative medication   GERD (gastroesophageal reflux disease) 2013   History of radiation therapy 02/07/17- 03/21/17   Right Breast- 4 field 25 fractions. 50 Gy to SCLV/PAB in 25 fractions. Right Breast Boost 10 gy in 5 fractions.   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 [facility])    Metastatic adenocarcinoma to bone [facility])    Metastatic adenocarcinoma to liver [facility])    Migraine 2013   migraines    Neuromuscular disorder (HCC) 03/20/2017   neuropathy in fingers and toes from Chemo--intermittent   Obesity    Osteoporosis 03/23/2017   noted per bone density scan   Peripheral neuropathy 08/13/2017   Personal history of chemotherapy 11/2016   Personal history of radiation therapy    4/18   Right wrist fracture 06/2015   Resolved   Scoliosis of thoracic spine 01/04/2012   Skin condition 2012   patient reports periodic episodes of severe itching. She will itch and then blister at areas including her arms, back, and buttocks.    Urinary, incontinence, stress female 07/14/2016   patient reported    Past Surgical History:  Procedure Laterality Date   AXILLARY LYMPH NODE DISSECTION Right 12/26/2016   Procedure: RIGHT AXILLARY LYMPH NODE DISSECTION;  Surgeon: Morene Olives, MD;  Location: [facility];  Service: General;  Laterality: Right;   BREAST LUMPECTOMY Right 2018   BREAST LUMPECTOMY WITH NEEDLE LOCALIZATION Right 12/19/2016   Procedure: RIGHT BREAST NEEDLE LOCALIZED LUMPECTOMY, RIGHT RADIOACTIVE SEED TARGETED AXILLARY SENTINEL LYMPH NODE BIOPSY;  Surgeon: Morene Olives, MD;  Location:  SURGERY CENTER;  Service: General;  Laterality: Right;  IR ANGIOGRAM SELECTIVE EACH ADDITIONAL VESSEL  08/27/2023   IR ANGIOGRAM VISCERAL SELECTIVE  08/27/2023   IR EMBO ARTERIAL NOT HEMORR HEMANG INC GUIDE ROADMAPPING  08/17/2023   IR EMBO TUMOR ORGAN ISCHEMIA INFARCT INC GUIDE ROADMAPPING  08/27/2023   IR EMBO TUMOR ORGAN ISCHEMIA INFARCT INC GUIDE ROADMAPPING  10/08/2023   IR GENERIC HISTORICAL  10/09/2016   IR CV LINE INJECTION 10/09/2016 Marcey Moan, MD [facility]   IR IMAGING GUIDED PORT INSERTION  06/14/2022   IR RADIOLOGIST EVAL & MGMT  08/02/2023   IR US  GUIDE VASC ACCESS RIGHT  08/27/2023   LAPAROSCOPIC APPENDECTOMY N/A 11/28/2018   Procedure: APPENDECTOMY LAPAROSCOPIC;  Surgeon: Signe Mitzie LABOR, MD;  Location: [facility];  Service: General;  Laterality: N/A;    PORT-A-CATH REMOVAL Left 12/19/2016   Procedure: REMOVAL PORT-A-CATH;  Surgeon: Morene Olives, MD;  Location: [facility];  Service: General;  Laterality: Left;   PORTA CATH INSERTION  2021   PORTACATH PLACEMENT N/A 07/11/2016   Procedure: INSERTION PORT-A-CATH;  Surgeon: Morene Olives, MD;  Location: [facility];  Service: General;  Laterality: N/A;   RADIOACTIVE SEED GUIDED AXILLARY SENTINEL LYMPH NODE Right 12/19/2016   Procedure: RADIOACTIVE SEED GUIDED AXILLARY SENTINEL LYMPH NODE BIOPSY;  Surgeon: Morene Olives, MD;  Location: [facility];  Service: General;  Laterality: Right;   WISDOM TOOTH EXTRACTION  yrs ago    Current Outpatient Medications on File Prior to Visit  Medication Sig Dispense Refill   buPROPion  (WELLBUTRIN  XL) 150 MG 24 hr tablet Take 1 tablet (150 mg total) by mouth daily. 90 tablet 1   Carboxymethylcell-Glycerin  PF 0.5-0.9 % SOLN Place 2 drops into both eyes 4 (four) times daily. And as needed. 1 each 11   cromolyn (OPTICROM) 4 % ophthalmic solution SMARTSIG:In Eye(s)     cyclobenzaprine  (FLEXERIL ) 5 MG tablet Take 1 tablet (5 mg total) by mouth 3 (three) times daily as needed for muscle spasms. 30 tablet 0   Denosumab  (XGEVA  [place]) Inject 120 mg into the skin every 3 (three) months.     dexamethasone  (DECADRON ) 0.5 MG/5ML solution Swish 10 mL (1 mg) by mouth for 2 minutes, then spit. Repeat 4 times daily. Start on 1st day of datopotamab deruxtecan. 500 mL 4   dexamethasone  (DECADRON ) 0.5 MG/5ML solution Swish 10 mLs (1 mg total) for 2 minutes, then spit. Repeat 4 (four) times daily. Start on 1st day of Datopotamab 500 mL 4   KLOR-CON  M10 10 MEQ tablet TAKE 1 TABLET BY MOUTH EVERY DAY 90 tablet 1   loratadine  (CLARITIN ) 10 MG tablet Take 10 mg by mouth daily.     magic mouthwash (nystatin , lidocaine , diphenhydrAMINE , alum & mag hydroxide) suspension Take 5 mLs by mouth 4 (four) times daily as needed for mouth pain. Suspension contains  equal amounts of Maalox Extra Strength, nystatin , diphenhydramine  and lidocaine . 240 mL 2   Morphine  Sulfate (MORPHINE  CONCENTRATE) 10 mg / 0.5 ml concentrated solution Take 0.5 mLs (10 mg total) by mouth every 2 (two) hours as needed. 120 mL 0   naproxen sodium (ALEVE) 220 MG tablet Take 440-660 mg by mouth 2 (two) times daily as needed (pain).     omeprazole  (PRILOSEC) 20 MG capsule Take 20 mg by mouth daily.     ondansetron  (ZOFRAN ) 8 MG tablet Take 1 tablet (8 mg total) by mouth every 8 (eight) hours as needed for nausea or vomiting. Start on the third day after chemotherapy. 30 tablet 1   prochlorperazine  (COMPAZINE )  10 MG tablet Take 1 tablet (10 mg total) by mouth every 6 (six) hours as needed for nausea or vomiting. 30 tablet 1   rizatriptan  (MAXALT ) 5 MG tablet Take 1 tablet (5 mg total) by mouth as needed for migraine. May repeat in 2 hours if needed 10 tablet 0   valACYclovir  (VALTREX ) 1000 MG tablet Take 1 tablet (1,000 mg total) by mouth 2 (two) times daily. 180 tablet 4   XIIDRA 5 % SOLN      No current facility-administered medications on file prior to visit.    Allergies  Allergen Reactions   Datopotamab Deruxtecan Other (See Comments) and Cough    Pt had hypersensitivity reaction which included, coughing and congestion at the end of infusion. See Progress note dated 04/15/2024.   Eribulin  Mesylate Palpitations    Patient developed a flushing sensation and palpitations after her treatment had just completed.   Codeine  Nausea And Vomiting   Cymbalta  [Duloxetine  Hcl] Other (See Comments)    Causes sores on arm   Hydrocodone  Nausea Only and Other (See Comments)    dizziness   Ultram  [Tramadol  Hcl] Nausea Only   Venlafaxine  Other (See Comments)    Causes sores on arm      PE Today's Vitals   07/09/24 1001  BP: 98/60  Pulse: 90  Temp: 97.8 F (36.6 C)  TempSrc: Oral  SpO2: 96%  Weight: 189 lb (85.7 kg)   Body mass index is 33.48 kg/m.  Physical Exam Vitals  reviewed. Exam conducted with a chaperone present.  Constitutional:      General: She is not in acute distress.    Appearance: Normal appearance.  HENT:     Head: Normocephalic and atraumatic.     Nose: Nose normal.  Eyes:     Extraocular Movements: Extraocular movements intact.     Conjunctiva/sclera: Conjunctivae normal.  Pulmonary:     Effort: Pulmonary effort is normal.  Genitourinary:    General: Normal vulva.     Exam position: Lithotomy position.     Vagina: Normal. No vaginal discharge.     Cervix: Normal. No cervical motion tenderness, discharge or lesion.     Uterus: Normal. Not enlarged and not tender.      Adnexa: Right adnexa normal and left adnexa normal.  Musculoskeletal:        General: Normal range of motion.     Cervical back: Normal range of motion.  Neurological:     General: No focal deficit present.     Mental Status: She is alert.  Psychiatric:        Mood and Affect: Mood normal.        Behavior: Behavior normal.      Colposcopy and EMB  Procedure Consented for procedure.  Time out performed. Speculum placed in vagina.   Cervical block was performed with 10cc 1% lidocaine  (Lot [id number], Exp 2026-Sept). A single-toothed tenaculum was placed on the anterior lip of the cervix to stabilize it.   Acetic acid 3% was applied to cervix.  Unsatisfactory colposcopy. TZ was NOT seen.  Cervical polyp was seen and removed. Biopsies taken Yes.   Location(s) - 2:00 (vascularity), 7:00 (+AW change) ECC was performed.  The 3 mm pipelle was introduced into the endometrial cavity without difficulty to a depth of 7cm, suction initiated and a minimal amount of tissue was obtained over 2 passes and sent to pathology.  Monsel's applied to biopsy areas.  Good hemostasis.  Minimal EBL The instruments were removed from  the patient's vagina.  No complications.  Tolerated well.     Assessment and Plan:        Atypical glandular cells on cervical Pap  smear  Abnormal PAP results reviewed. ASCCP guidelines reviewed. Consents signed. Unsatisfactory colposcopy, ECC, EMB and biopsy performed. Aftercare instructions provided.  Will call with results. I also recommend completion of Gardasil vaccine up to age 45yo, avoidance of smoking, and use of condoms with new sexual partners to prevent progression of disease.  RTO for discussion if greater than CIN1 or abnormal EMB. Otherwise f/u PAP in 6 month  Sherrina Zaugg LULLA Pa, MD

## 2024-07-12 LAB — SURGICAL PATHOLOGY

## 2024-07-14 ENCOUNTER — Ambulatory Visit: Payer: Self-pay | Admitting: Obstetrics and Gynecology

## 2024-07-15 ENCOUNTER — Ambulatory Visit (HOSPITAL_COMMUNITY)
Admission: RE | Admit: 2024-07-15 | Discharge: 2024-07-15 | Disposition: A | Discharge status: Home / Self Care | Source: Ambulatory Visit | Attending: Adult Health | Admitting: Adult Health

## 2024-07-15 DIAGNOSIS — C50911 Malignant neoplasm of unspecified site of right female breast: Secondary | ICD-10-CM | POA: Diagnosis present

## 2024-07-15 MED ORDER — IOHEXOL 300 MG/ML  SOLN
100.0000 mL | Freq: Once | INTRAMUSCULAR | Status: AC | PRN
  Administered 2024-07-15 (×2): 100 mL via INTRAVENOUS

## 2024-07-15 MED ORDER — HEPARIN SOD (PORK) LOCK FLUSH 100 UNIT/ML IV SOLN
500.0000 [IU] | Freq: Once | INTRAVENOUS | Status: AC
  Administered 2024-07-15 (×2): 500 [IU] via INTRAVENOUS

## 2024-07-19 ENCOUNTER — Encounter: Payer: Self-pay | Admitting: Adult Health

## 2024-07-22 ENCOUNTER — Ambulatory Visit: Admitting: Physician Assistant

## 2024-07-22 ENCOUNTER — Ambulatory Visit: Admitting: Cardiology

## 2024-07-23 ENCOUNTER — Encounter: Payer: Self-pay | Admitting: Hematology and Oncology

## 2024-07-23 ENCOUNTER — Inpatient Hospital Stay

## 2024-07-23 ENCOUNTER — Telehealth: Payer: Self-pay | Admitting: Hematology and Oncology

## 2024-07-23 ENCOUNTER — Other Ambulatory Visit: Payer: Self-pay | Admitting: Pharmacist

## 2024-07-23 DIAGNOSIS — Z170 Estrogen receptor positive status [ER+]: Secondary | ICD-10-CM

## 2024-07-23 DIAGNOSIS — Z5111 Encounter for antineoplastic chemotherapy: Secondary | ICD-10-CM | POA: Diagnosis not present

## 2024-07-23 DIAGNOSIS — C50911 Malignant neoplasm of unspecified site of right female breast: Secondary | ICD-10-CM

## 2024-07-23 DIAGNOSIS — C50919 Malignant neoplasm of unspecified site of unspecified female breast: Secondary | ICD-10-CM

## 2024-07-23 LAB — CBC WITH DIFFERENTIAL (CANCER CENTER ONLY)
Abs Immature Granulocytes: 0 K/uL (ref 0.00–0.07)
Basophils Absolute: 0 K/uL (ref 0.0–0.1)
Basophils Relative: 1 %
Eosinophils Absolute: 0.1 K/uL (ref 0.0–0.5)
Eosinophils Relative: 4 %
HCT: 32.6 % — ABNORMAL LOW (ref 36.0–46.0)
Hemoglobin: 11.4 g/dL — ABNORMAL LOW (ref 12.0–15.0)
Immature Granulocytes: 0 %
Lymphocytes Relative: 12 %
Lymphs Abs: 0.3 K/uL — ABNORMAL LOW (ref 0.7–4.0)
MCH: 39.9 pg — ABNORMAL HIGH (ref 26.0–34.0)
MCHC: 35 g/dL (ref 30.0–36.0)
MCV: 114 fL — ABNORMAL HIGH (ref 80.0–100.0)
Monocytes Absolute: 0.4 K/uL (ref 0.1–1.0)
Monocytes Relative: 18 %
Neutro Abs: 1.3 K/uL — ABNORMAL LOW (ref 1.7–7.7)
Neutrophils Relative %: 65 %
Platelet Count: 97 K/uL — ABNORMAL LOW (ref 150–400)
RBC: 2.86 MIL/uL — ABNORMAL LOW (ref 3.87–5.11)
RDW: 16.5 % — ABNORMAL HIGH (ref 11.5–15.5)
WBC Count: 2.1 K/uL — ABNORMAL LOW (ref 4.0–10.5)
nRBC: 0 % (ref 0.0–0.2)

## 2024-07-23 LAB — CMP (CANCER CENTER ONLY)
ALT: 24 U/L (ref 0–44)
AST: 47 U/L — ABNORMAL HIGH (ref 15–41)
Albumin: 2.9 g/dL — ABNORMAL LOW (ref 3.5–5.0)
Alkaline Phosphatase: 348 U/L — ABNORMAL HIGH (ref 38–126)
Anion gap: 3 — ABNORMAL LOW (ref 5–15)
BUN: 9 mg/dL (ref 8–23)
CO2: 27 mmol/L (ref 22–32)
Calcium: 8.5 mg/dL — ABNORMAL LOW (ref 8.9–10.3)
Chloride: 108 mmol/L (ref 98–111)
Creatinine: 0.43 mg/dL — ABNORMAL LOW (ref 0.44–1.00)
GFR, Estimated: >60 mL/min (ref >60)
Glucose, Bld: 120 mg/dL — ABNORMAL HIGH (ref 70–99)
Potassium: 3.5 mmol/L (ref 3.5–5.1)
Sodium: 138 mmol/L (ref 135–145)
Total Bilirubin: 1.2 mg/dL (ref 0.0–1.2)
Total Protein: 5.4 g/dL — ABNORMAL LOW (ref 6.5–8.1)

## 2024-07-23 MED ORDER — SODIUM CHLORIDE 0.9% FLUSH
10.0000 mL | Freq: Once | INTRAVENOUS | Status: AC
  Administered 2024-07-23: 10 mL

## 2024-07-23 NOTE — Telephone Encounter (Signed)
 I discussed with the patient that before we decide on the next treatment we would like to obtain Guardant360 to evaluate for mutations on the cancer cells.  Unfortunately she has some car trouble and will figure out when then what time she will be able to come and do the blood test.  She will send a MyChart message or will call us .

## 2024-07-24 LAB — CANCER ANTIGEN 27.29: CA 27.29: 546.2 U/mL — ABNORMAL HIGH (ref 0.0–38.6)

## 2024-07-26 ENCOUNTER — Inpatient Hospital Stay

## 2024-07-28 ENCOUNTER — Inpatient Hospital Stay

## 2024-07-28 ENCOUNTER — Inpatient Hospital Stay: Admitting: Hematology and Oncology

## 2024-08-05 ENCOUNTER — Ambulatory Visit (INDEPENDENT_AMBULATORY_CARE_PROVIDER_SITE_OTHER): Admitting: Obstetrics and Gynecology

## 2024-08-05 ENCOUNTER — Inpatient Hospital Stay: Attending: Oncology | Admitting: Hematology and Oncology

## 2024-08-05 ENCOUNTER — Encounter: Payer: Self-pay | Admitting: Obstetrics and Gynecology

## 2024-08-05 VITALS — BP 122/60 | HR 100 | Temp 98.2°F | Wt 177.0 lb

## 2024-08-05 DIAGNOSIS — M797 Fibromyalgia: Secondary | ICD-10-CM | POA: Insufficient documentation

## 2024-08-05 DIAGNOSIS — K219 Gastro-esophageal reflux disease without esophagitis: Secondary | ICD-10-CM | POA: Insufficient documentation

## 2024-08-05 DIAGNOSIS — Z5111 Encounter for antineoplastic chemotherapy: Secondary | ICD-10-CM | POA: Insufficient documentation

## 2024-08-05 DIAGNOSIS — R609 Edema, unspecified: Secondary | ICD-10-CM | POA: Insufficient documentation

## 2024-08-05 DIAGNOSIS — J90 Pleural effusion, not elsewhere classified: Secondary | ICD-10-CM | POA: Insufficient documentation

## 2024-08-05 DIAGNOSIS — C50411 Malignant neoplasm of upper-outer quadrant of right female breast: Secondary | ICD-10-CM | POA: Insufficient documentation

## 2024-08-05 DIAGNOSIS — C773 Secondary and unspecified malignant neoplasm of axilla and upper limb lymph nodes: Secondary | ICD-10-CM | POA: Insufficient documentation

## 2024-08-05 DIAGNOSIS — Z79899 Other long term (current) drug therapy: Secondary | ICD-10-CM | POA: Insufficient documentation

## 2024-08-05 DIAGNOSIS — N871 Moderate cervical dysplasia: Secondary | ICD-10-CM | POA: Diagnosis not present

## 2024-08-05 DIAGNOSIS — K1379 Other lesions of oral mucosa: Secondary | ICD-10-CM | POA: Insufficient documentation

## 2024-08-05 DIAGNOSIS — C50911 Malignant neoplasm of unspecified site of right female breast: Secondary | ICD-10-CM

## 2024-08-05 DIAGNOSIS — Z923 Personal history of irradiation: Secondary | ICD-10-CM | POA: Insufficient documentation

## 2024-08-05 DIAGNOSIS — Z79624 Long term (current) use of inhibitors of nucleotide synthesis: Secondary | ICD-10-CM | POA: Insufficient documentation

## 2024-08-05 DIAGNOSIS — C7951 Secondary malignant neoplasm of bone: Secondary | ICD-10-CM | POA: Insufficient documentation

## 2024-08-05 DIAGNOSIS — R234 Changes in skin texture: Secondary | ICD-10-CM | POA: Insufficient documentation

## 2024-08-05 DIAGNOSIS — M810 Age-related osteoporosis without current pathological fracture: Secondary | ICD-10-CM | POA: Insufficient documentation

## 2024-08-05 DIAGNOSIS — Z170 Estrogen receptor positive status [ER+]: Secondary | ICD-10-CM | POA: Insufficient documentation

## 2024-08-05 DIAGNOSIS — G629 Polyneuropathy, unspecified: Secondary | ICD-10-CM | POA: Insufficient documentation

## 2024-08-05 DIAGNOSIS — R5383 Other fatigue: Secondary | ICD-10-CM | POA: Insufficient documentation

## 2024-08-05 DIAGNOSIS — Z87891 Personal history of nicotine dependence: Secondary | ICD-10-CM | POA: Insufficient documentation

## 2024-08-05 DIAGNOSIS — L299 Pruritus, unspecified: Secondary | ICD-10-CM | POA: Insufficient documentation

## 2024-08-05 DIAGNOSIS — K1230 Oral mucositis (ulcerative), unspecified: Secondary | ICD-10-CM | POA: Insufficient documentation

## 2024-08-05 DIAGNOSIS — E876 Hypokalemia: Secondary | ICD-10-CM | POA: Insufficient documentation

## 2024-08-05 DIAGNOSIS — Z9221 Personal history of antineoplastic chemotherapy: Secondary | ICD-10-CM | POA: Insufficient documentation

## 2024-08-05 DIAGNOSIS — Z809 Family history of malignant neoplasm, unspecified: Secondary | ICD-10-CM | POA: Insufficient documentation

## 2024-08-05 DIAGNOSIS — H04209 Unspecified epiphora, unspecified lacrimal gland: Secondary | ICD-10-CM | POA: Insufficient documentation

## 2024-08-05 DIAGNOSIS — E7800 Pure hypercholesterolemia, unspecified: Secondary | ICD-10-CM | POA: Insufficient documentation

## 2024-08-05 DIAGNOSIS — Z1721 Progesterone receptor positive status: Secondary | ICD-10-CM | POA: Insufficient documentation

## 2024-08-05 DIAGNOSIS — C787 Secondary malignant neoplasm of liver and intrahepatic bile duct: Secondary | ICD-10-CM | POA: Insufficient documentation

## 2024-08-05 DIAGNOSIS — Z1509 Genetic susceptibility to other malignant neoplasm: Secondary | ICD-10-CM | POA: Insufficient documentation

## 2024-08-05 DIAGNOSIS — Z79811 Long term (current) use of aromatase inhibitors: Secondary | ICD-10-CM | POA: Insufficient documentation

## 2024-08-05 DIAGNOSIS — E8809 Other disorders of plasma-protein metabolism, not elsewhere classified: Secondary | ICD-10-CM | POA: Insufficient documentation

## 2024-08-05 LAB — CBC
HCT: 37.7 % (ref 35.0–45.0)
Hemoglobin: 13.1 g/dL (ref 11.7–15.5)
MCH: 39.7 pg — ABNORMAL HIGH (ref 27.0–33.0)
MCHC: 34.7 g/dL (ref 32.0–36.0)
MCV: 114.2 fL — ABNORMAL HIGH (ref 80.0–100.0)
MPV: 12.3 fL (ref 7.5–12.5)
Platelets: 130 Thousand/uL — ABNORMAL LOW (ref 140–400)
RBC: 3.3 Million/uL — ABNORMAL LOW (ref 3.80–5.10)
RDW: 15.3 % — ABNORMAL HIGH (ref 11.0–15.0)
WBC: 3.4 Thousand/uL — ABNORMAL LOW (ref 3.8–10.8)

## 2024-08-05 NOTE — Assessment & Plan Note (Signed)
 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 Chest wall nodules: 02/04/2024: Caris testing identified PTEN deletion suggesting benefit to Capivasertib with fulvestrant .  (ER +95%, PR +95%, HER2 negative null score 0, MSI: Stable, TMB 5, genomic LOH: High   CT CAP: Liver mets 2.9 cm (was 1.7 cm), 2.5 cm (was 2.7 cm) others stable, enl sub pleural nodule 0.8 cm, increase in Left supraclav LN and pe vascular LN), stable bone mets ---------------------------------------------------------------------------------------------------------------------------------------- CT CAP 07/21/2024: New skin thickening left breast and newly enlarged left axillary and subpectoral lymph nodes, slight interval enlargement of liver lesions moderate right pleural effusion and interval enlargement of right pleural nodules, groundglass opacities unchanged bone metastases  Guardant360 07/23/2024: T p53 mutations (no FDA approved therapies) TMB 17.0 8 mut/Mb  Recommendation: Pembrolizumab  Follow-up in 1 week to start treatment

## 2024-08-05 NOTE — Progress Notes (Signed)
 HEMATOLOGY-ONCOLOGY TELEPHONE VISIT PROGRESS NOTE  I connected with our patient on 08/05/24 at  8:00 AM EDT by telephone and verified that I am speaking with the correct person using two identifiers.  I discussed the limitations, risks, security and privacy concerns of performing an evaluation and management service by telephone and the availability of in person appointments.  I also discussed with the patient that there may be a patient responsible charge related to this service. The patient expressed understanding and agreed to proceed.   History of Present Illness: Telephone follow-up to discuss treatment plan  History of Present Illness April [name] is a 63 year old female with metastatic breast cancer who presents for discussion of genetic test results and treatment options.  The GARDENT genetic test reveals several mutations, including TP53 mutations, with a tumor mutation burden (TMB) score of 17.  She has persistent oral lesions described as 'water-filled bumps' that become painful after rupturing, especially during eating. Dexamethasone  mouth rinses provide some relief.  She experiences significant chest pain, particularly in the nipple area of both breasts, which worsens at night and is exacerbated by contact. Massage provides some relief. The pain is accompanied by severe pruritus.  She had an adverse reaction to gabapentin , which caused significant cognitive side effects, and has discontinued its use.    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)   06/21/2022 - 08/03/2022 Chemotherapy   Patient is on Treatment Plan : BREAST METASTATIC fam-trastuzumab deruxtecan-nxki  (Enhertu ) q21d     06/21/2022 - 04/19/2023 Chemotherapy   Patient is on Treatment Plan : BREAST METASTATIC Fam-Trastuzumab Deruxtecan-nxki  (Enhertu ) (5.4) q21d     05/18/2023 - 07/06/2023 Chemotherapy    Patient is on Treatment Plan : BREAST METASTATIC Sacituzumab govitecan -hziy (Trodelvy ) D1,8 q21d     10/16/2023 - 01/05/2024 Chemotherapy   Patient is on Treatment Plan : BREAST METASTATIC Eribulin  D1,8 q21d     01/21/2024 - 03/17/2024 Chemotherapy   Patient is on Treatment Plan : BREAST Liposomal Doxorubicin  (50) q28d     04/15/2024 - 07/07/2024 Chemotherapy   Patient is on Treatment Plan : BREAST Datopotamab Deruxtecan-dlnk  (Datroway ) q21d     Malignant neoplasm of upper-outer quadrant of right breast in female, estrogen receptor positive (HCC)  06/21/2016 Initial Diagnosis   Malignant neoplasm of upper-outer quadrant of right breast in female, estrogen receptor positive (HCC)   06/21/2016 Initial Biopsy   Right breast biopsy, 10 oclock: IDC, grade 3, ER+(95%), PR+(80%),Ki67 20%, HER-2 negative (ratio 0.67). Right axilla core biopsy: carcinoma, grade 3, ER+(100%), PR+(90%), Ki67 25%, HER-2 negative (ratio 1.13).    07/17/2016 - 11/06/2016 Neo-Adjuvant Chemotherapy   Received 2 cycles of Doxorubicin  and Cyclophosphamide , then transitioned to weekly Paclitaxel  (due to repeated febrile neutropenia) x 7 cycles, stopped early due to peripheral neuropathy, then completed her final 2 cycles of Doxorubicin  and Cyclophosphamide .    12/19/2016 Surgery   Right breast lumpectomy ([facility]): IDC, grade 2, 1.6cm and 0.3cm, margins negative, 3 SLN positive for metastatic carcinoma.     12/26/2016 Surgery   ALND: metastatic carcinoma in one of 20 lymph nodes, and three nodes from previous lumpectomy.  Four positive nodes, consistent with pN2a.   02/07/2017 - 03/21/2017 Radiation Therapy   Adjuvant radiation [facility]): 1) Right breast and nodes - 4 field: 50 Gy in 25 fractions. IM NODES: >95% receive at least 45Gy/75fx. 50Gy to SCLV/PAB @ 2Gy /fraction x 25 fractions. 2)  Right breast boost: 10 Gy in 5 fractions   04/2017 -  Anti-estrogen oral therapy   Anastrozole  1mg  daily.  Bone density 03/23/2017 finds T score  of -2.6, osteoporosis, plan to start Prolia  following dental clearance Anastrozole  stopped 01/16/18 Exemestane  25 mg daily 02/12/18  On [name], trial randomized to endocrine therapy alone   05/27/2020 Progression   chest CT scan 05/27/2020 shows bulky mediastinal and right hilar lymphadenopathy with right pleural nodules and a small right pleural effusion, no evidence of liver or bone involvement             (a) biopsy of right breast mass 06/02/2020 shows invasive ductal carcinoma, estrogen and progesterone receptor positive, HER-2 not amplified, with an MIB-1 of 40%   06/17/2020 Treatment Plan Change    fulvestrant  to start 06/17/2020             (a) palbociclib  to start 06/17/2020 at 125 mg daily 21 days on 7 days off             (b) palbociclib  decreased to 125mg  every other day x 11 doses on 07/23/2020             (c) palbociclib  dose decreased to100 mg daily, 21/7, starting with September cycle             (d) Palbociclib  decreased to 75mg  daily beginning with March cycle due to oral ulcers             (e) palbociclib  discontinued November 2022 with progression   09/14/2021 PET scan   PET scan 09/14/2021 shows progression in liver and bone   10/05/2021 Pathology Results   liver biopsy 10/05/2021 confirms metastatic carcinoma, estrogen receptor strongly positive, progesterone receptor and HER2 negative, with an MIB-1 of 30%.   10/2021 Treatment Plan Change   fulvestrant  continued, Xgeva  added, palbociclib  changed to abemaciclib  November 2022 -Dose Decreased to 50mg  PO BID Daily   03/07/2022 Miscellaneous   Caris molecular testing: ER positive, ER positive, MTAP: Detected, ESR 1 negative, BRCA 1 and 2 negative, MSI stable, PD-L1 negative, PIK 3 CA negative, PR negative,   06/21/2022 - 08/03/2022 Chemotherapy   Patient is on Treatment Plan : BREAST METASTATIC fam-trastuzumab deruxtecan-nxki  (Enhertu ) q21d     06/21/2022 - 04/19/2023 Chemotherapy   Patient is on Treatment Plan : BREAST  METASTATIC Fam-Trastuzumab Deruxtecan-nxki  (Enhertu ) (5.4) q21d     01/25/2023 Cancer Staging   Staging form: Breast, AJCC 7th Edition - Pathologic: Stage IV (M1) - Signed by Crawford Morna Pickle, NP on 01/25/2023   05/18/2023 - 07/06/2023 Chemotherapy   Patient is on Treatment Plan : BREAST METASTATIC Sacituzumab govitecan -hziy (Trodelvy ) D1,8 q21d     07/23/2023 -  Chemotherapy   Xeloda  1000 mg bid 14 days on and 7 days off   08/27/2023 Procedure   Y90 hepatic radio embolization   10/16/2023 - 01/05/2024 Chemotherapy   Patient is on Treatment Plan : BREAST METASTATIC Eribulin  D1,8 q21d     01/21/2024 - 03/17/2024 Chemotherapy   Patient is on Treatment Plan : BREAST Liposomal Doxorubicin  (50) q28d     04/15/2024 - 07/07/2024 Chemotherapy   Patient is on Treatment Plan : BREAST Datopotamab Deruxtecan-dlnk  (Datroway ) q21d     Breast cancer, stage 4 (HCC)  03/09/2022 Initial Diagnosis   Breast cancer, stage 4 (HCC)   06/21/2022 - 08/03/2022 Chemotherapy   Patient is on Treatment Plan : BREAST METASTATIC fam-trastuzumab deruxtecan-nxki  (Enhertu ) q21d     06/21/2022 - 04/19/2023 Chemotherapy   Patient is on  Treatment Plan : BREAST METASTATIC Fam-Trastuzumab Deruxtecan-nxki  (Enhertu ) (5.4) q21d     05/18/2023 - 07/06/2023 Chemotherapy   Patient is on Treatment Plan : BREAST METASTATIC Sacituzumab govitecan -hziy (Trodelvy ) D1,8 q21d     10/16/2023 - 01/05/2024 Chemotherapy   Patient is on Treatment Plan : BREAST METASTATIC Eribulin  D1,8 q21d     01/21/2024 - 03/17/2024 Chemotherapy   Patient is on Treatment Plan : BREAST Liposomal Doxorubicin  (50) q28d     04/15/2024 - 07/07/2024 Chemotherapy   Patient is on Treatment Plan : BREAST Datopotamab Deruxtecan-dlnk  (Datroway ) q21d       REVIEW OF SYSTEMS:   Constitutional: Denies fevers, chills or abnormal weight loss All other systems were reviewed with the patient and are negative. Observations/Objective:     Assessment Plan:  Breast cancer  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) Metastatic breast cancer with bone and liver metastasis: Prior treatments: Ibrance  fulvestrant , Verzinio fulvestrant , Enhertu , Sacituzumab, Xeloda , Y90 (10/08/2023), eribulin , Doxil , Datopotamab Deruxtecan   Bone metastasis: Currently on Xgeva  every 3 months. Chest wall nodules: 02/04/2024: Caris testing identified PTEN deletion suggesting benefit to Capivasertib with fulvestrant .  (ER +95%, PR +95%, HER2 negative null score 0, MSI: Stable, TMB 5, genomic LOH: High   CT CAP: Liver mets 2.9 cm (was 1.7 cm), 2.5 cm (was 2.7 cm) others stable, enl sub pleural nodule 0.8 cm, increase in Left supraclav LN and pe vascular LN), stable bone mets ---------------------------------------------------------------------------------------------------------------------------------------- CT CAP 07/21/2024: New skin thickening left breast and newly enlarged left axillary and subpectoral lymph nodes, slight interval enlargement of liver lesions moderate right pleural effusion and interval enlargement of right pleural nodules, groundglass opacities unchanged bone metastases  Guardant360 07/23/2024: T p53 mutations (no FDA approved therapies) TMB 17.0 8 mut/Mb  Recommendation: Pembrolizumab  Persistent mouth sores: From prior treatment Itching of the skin related to metastatic cancer: Advised her to apply Benadryl  cream Follow-up in 1 week to start treatment       I discussed the assessment and treatment plan with the patient. The patient was provided an opportunity to ask questions and all were answered. The patient agreed with the plan and demonstrated an understanding of the instructions. The patient was advised to call back or seek an in-person evaluation if the symptoms worsen or if the condition fails to improve as anticipated.   I provided 20 minutes of non-face-to-face time during this encounter.  This includes time for charting and coordination of care   Naomi MARLA Chad, MD

## 2024-08-05 NOTE — Progress Notes (Signed)
 63 y.o. 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 Odean) and new diagnosis of CIN 2 here for results discussion. Divorced  No LMP recorded. Patient is postmenopausal.   Last PAP:    Component Value Date/Time   DIAGPAP (A) 06/17/2024 1448    - Atypical squamous cells, cannot exclude high grade squamous   DIAGPAP intraepithelial lesion (ASC-H) (A) 06/17/2024 1448   DIAGPAP - Atypical glandular cells, NOS (A) 06/17/2024 1448   HPVHIGH Negative 06/17/2024 1448   ADEQPAP  06/17/2024 1448    Satisfactory for evaluation. The presence or absence of an   ADEQPAP  06/17/2024 1448    endocervical/transformation zone component cannot be determined because   ADEQPAP of atrophy. 06/17/2024 1448   07/09/24 path: A. CERVIX, POLYPECTOMY: - Small fragments of squamous epithelium with high-grade squamous intraepithelial lesion (HSIL/CIN-2). - Intact mucosa is not present for evaluation.  B. CERVIX, 2 O'CLOCK, BIOPSY: - Low-grade squamous intraepithelial lesion (LSIL/CIN-1). - Focal stromal hemorrhage, non-specific. - Transformation zone is not present for evaluation.  C. CERVIX, 7 O'CLOCK, BIOPSY: - High-grade squamous intraepithelial lesion (HSIL/CIN-2). - Transformation zone is present.  D. ENDOMETRIUM, BIOPSY: - Extremely scant specimen consisting of rare atrophic strips of endometrium. - Rare small fragments of unremarkable endocervical glandular epithelium. - Negative for atypia and malignancy in this extremely limited sample.  E. ENDOCERVIX, CURETTAGE: - Single tiny group of atypical squamous epithelial cells suspicious for squamous intraepithelial lesion. - Few small strips of unremarkable endocervical glandular epithelium.   Colposcopy was insufficient as TZ zone was not seen. 07/23/24 WBC 2.1 Planning to stop chemotherapy and start keytruda , immunotherapy   GYN HISTORY: Stage IV Breast cancer   OB  History  Gravida Para Term Preterm AB Living  3 1 1  2 1   SAB IAB Ectopic Multiple Live Births   2   1    # Outcome Date GA Lbr Len/2nd Weight Sex Type Anes PTL Lv  3 IAB           2 IAB           1 Term      Vag-Spont   LIV    Past Medical History:  Diagnosis Date   Allergy 07/28/2012   Seasonal/Environmental allergies   Anxiety 2013   Since 2013   Arthritis 2014 per patient    knees and shoulders   Bilateral ankle fractures 07/2015   Booted and resolved    Cancer [facility]) dx June 22, 2016   right breast   Depression 2013   Multiple  episodes  in past.   Elevated cholesterol 2017   Fibromyalgia 2013   diagnosed by Dr. Dolphus   Genital herpes 2005   Has outbreaks monthly if not on preventative medication   GERD (gastroesophageal reflux disease) 2013   History of radiation therapy 02/07/17- 03/21/17   Right Breast- 4 field 25 fractions. 50 Gy to SCLV/PAB in 25 fractions. Right Breast Boost 10 gy in 5 fractions.   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 [facility])    Metastatic adenocarcinoma to bone [facility])    Metastatic adenocarcinoma to liver [facility])    Migraine 2013   migraines   Neuromuscular disorder (HCC) 03/20/2017   neuropathy in fingers and toes from Chemo--intermittent   Obesity    Osteoporosis 03/23/2017   noted per bone density scan   Peripheral neuropathy 08/13/2017   Personal history of chemotherapy 11/2016   Personal  history of radiation therapy    4/18   Right wrist fracture 06/2015   Resolved   Scoliosis of thoracic spine 01/04/2012   Skin condition 2012   patient reports periodic episodes of severe itching. She will itch and then blister at areas including her arms, back, and buttocks.    Urinary, incontinence, stress female 07/14/2016   patient reported    Past Surgical History:  Procedure Laterality Date   AXILLARY LYMPH NODE DISSECTION Right 12/26/2016   Procedure: RIGHT AXILLARY LYMPH NODE DISSECTION;  Surgeon: Morene Olives, MD;   Location: [facility];  Service: General;  Laterality: Right;   BREAST LUMPECTOMY Right 2018   BREAST LUMPECTOMY WITH NEEDLE LOCALIZATION Right 12/19/2016   Procedure: RIGHT BREAST NEEDLE LOCALIZED LUMPECTOMY, RIGHT RADIOACTIVE SEED TARGETED AXILLARY SENTINEL LYMPH NODE BIOPSY;  Surgeon: Morene Olives, MD;  Location: [facility];  Service: General;  Laterality: Right;   IR ANGIOGRAM SELECTIVE EACH ADDITIONAL VESSEL  08/27/2023   IR ANGIOGRAM VISCERAL SELECTIVE  08/27/2023   IR EMBO ARTERIAL NOT HEMORR HEMANG INC GUIDE ROADMAPPING  08/17/2023   IR EMBO TUMOR ORGAN ISCHEMIA INFARCT INC GUIDE ROADMAPPING  08/27/2023   IR EMBO TUMOR ORGAN ISCHEMIA INFARCT INC GUIDE ROADMAPPING  10/08/2023   IR GENERIC HISTORICAL  10/09/2016   IR CV LINE INJECTION 10/09/2016 Marcey Moan, MD [facility]   IR IMAGING GUIDED PORT INSERTION  06/14/2022   IR RADIOLOGIST EVAL & MGMT  08/02/2023   IR US  GUIDE VASC ACCESS RIGHT  08/27/2023   LAPAROSCOPIC APPENDECTOMY N/A 11/28/2018   Procedure: APPENDECTOMY LAPAROSCOPIC;  Surgeon: Signe Mitzie LABOR, MD;  Location: [facility];  Service: General;  Laterality: N/A;   PORT-A-CATH REMOVAL Left 12/19/2016   Procedure: REMOVAL PORT-A-CATH;  Surgeon: Morene Olives, MD;  Location: [facility];  Service: General;  Laterality: Left;   PORTA CATH INSERTION  2021   PORTACATH PLACEMENT N/A 07/11/2016   Procedure: INSERTION PORT-A-CATH;  Surgeon: Morene Olives, MD;  Location: [facility];  Service: General;  Laterality: N/A;   RADIOACTIVE SEED GUIDED AXILLARY SENTINEL LYMPH NODE Right 12/19/2016   Procedure: RADIOACTIVE SEED GUIDED AXILLARY SENTINEL LYMPH NODE BIOPSY;  Surgeon: Morene Olives, MD;  Location: [facility];  Service: General;  Laterality: Right;   WISDOM TOOTH EXTRACTION  yrs ago    Current Outpatient Medications on File Prior to Visit  Medication Sig Dispense Refill   buPROPion  (WELLBUTRIN  XL) 150 MG 24 hr  tablet Take 1 tablet (150 mg total) by mouth daily. 90 tablet 1   cromolyn (OPTICROM) 4 % ophthalmic solution SMARTSIG:In Eye(s)     cyclobenzaprine  (FLEXERIL ) 5 MG tablet Take 1 tablet (5 mg total) by mouth 3 (three) times daily as needed for muscle spasms. 30 tablet 0   Denosumab  (XGEVA  Williams) Inject 120 mg into the skin every 3 (three) months.     KLOR-CON  M10 10 MEQ tablet TAKE 1 TABLET BY MOUTH EVERY DAY 90 tablet 1   loratadine  (CLARITIN ) 10 MG tablet Take 10 mg by mouth daily.     magic mouthwash (nystatin , lidocaine , diphenhydrAMINE , alum & mag hydroxide) suspension Take 5 mLs by mouth 4 (four) times daily as needed for mouth pain. Suspension contains equal amounts of Maalox Extra Strength, nystatin , diphenhydramine  and lidocaine . 240 mL 2   Morphine  Sulfate (MORPHINE  CONCENTRATE) 10 mg / 0.5 ml concentrated solution Take 0.5 mLs (10 mg total) by mouth every 2 (two) hours as needed. 120 mL 0   naproxen sodium (ALEVE) 220  MG tablet Take 440-660 mg by mouth 2 (two) times daily as needed (pain).     omeprazole  (PRILOSEC) 20 MG capsule Take 20 mg by mouth daily.     rizatriptan  (MAXALT ) 5 MG tablet Take 1 tablet (5 mg total) by mouth as needed for migraine. May repeat in 2 hours if needed 10 tablet 0   valACYclovir  (VALTREX ) 1000 MG tablet Take 1 tablet (1,000 mg total) by mouth 2 (two) times daily. 180 tablet 4   XIIDRA 5 % SOLN      Current Facility-Administered Medications on File Prior to Visit  Medication Dose Route Frequency Provider Last Rate Last Admin   lidocaine  (PF) (XYLOCAINE ) 1 % injection 10 mL  10 mL Infiltration Once         Allergies  Allergen Reactions   Datopotamab Deruxtecan Other (See Comments) and Cough    Pt had hypersensitivity reaction which included, coughing and congestion at the end of infusion. See Progress note dated 04/15/2024.   Eribulin  Mesylate Palpitations    Patient developed a flushing sensation and palpitations after her treatment had just completed.    Codeine  Nausea And Vomiting   Cymbalta  [Duloxetine  Hcl] Other (See Comments)    Causes sores on arm   Hydrocodone  Nausea Only and Other (See Comments)    dizziness   Ultram  [Tramadol  Hcl] Nausea Only   Venlafaxine  Other (See Comments)    Causes sores on arm      PE Today's Vitals   08/05/24 1216  BP: 122/60  Pulse: 100  Temp: 98.2 F (36.8 C)  TempSrc: Oral  SpO2: 96%  Weight: 177 lb (80.3 kg)   Body mass index is 31.35 kg/m.  Physical Exam Vitals reviewed.  Constitutional:      General: She is not in acute distress.    Appearance: Normal appearance.  HENT:     Head: Normocephalic and atraumatic.     Nose: Nose normal.  Eyes:     Extraocular Movements: Extraocular movements intact.     Conjunctiva/sclera: Conjunctivae normal.  Pulmonary:     Effort: Pulmonary effort is normal.  Musculoskeletal:        General: Normal range of motion.     Cervical back: Normal range of motion.  Neurological:     General: No focal deficit present.     Mental Status: She is alert.  Psychiatric:        Mood and Affect: Mood normal.        Behavior: Behavior normal.    Assessment and Plan:        Dysplasia of cervix, high grade CIN 2 Assessment & Plan: We discussed cervical cancer screening and colposcopic biopsies. Per ASCCP guidelines treatment is recommended with excisional procedure. We reviewed both LEEP and CKC procedures. Given comorbidities and low grade ECC, I recommend proceeding with a LEEP via local or sedation. Patient desires LEEP on local anesthesia.  Discussed outpatient procedure. Reviewed that recovery is usually 2 weeks. Reviewed risks including infection, bleeding, damage to surrounding structures.  Reviewed risk of recurrence, is higher with more severe dysplasia.   Orders: -     CBC -     LEEP with colposcopy; Future  WBC and Hgb improving after last round of chemotherapy Planning to start immunotherapy, Dr. Odean aware of procedure 08/12/24. Will  proceed  Vera LULLA Pa, MD

## 2024-08-05 NOTE — Progress Notes (Signed)
 DISCONTINUE OFF PATHWAY REGIMEN - Breast   [id number]:Datopotamab deruxtecan 6 mg/kg IV D1 q21 Days:   A cycle is every 21 days:     Datopotamab deruxtecan-dlnk    **Always confirm dose/schedule in your pharmacy ordering system**  PRIOR TREATMENT: Off Pathway: Datopotamab deruxtecan 6 mg/kg IV D1 q21 Days  START ON PATHWAY REGIMEN - Breast     A cycle is every 21 days:     Pembrolizumab   **Always confirm dose/schedule in your pharmacy ordering system**  Patient Characteristics: Distant Metastases or Locoregional Recurrent Disease - Unresected, M0 or Locally Advanced Unresectable Disease Progressing after Neoadjuvant and Local Therapies, M0, HER2 Negative/Ultralow/Low, ER Positive, Molecular Alterations (BRAFm, MSI-H/dMMR,  TMB-H, or NTRK/RET Gene Fusion), MSI-H/dMMR or TMB-H Therapeutic Status: Distant Metastases HER2 Status: Low ER Status: Positive (+) PR Status: Positive (+) Therapy Approach Indicated: Standard Therapy Exhausted and Molecular Alterations Present (BRAFm, MSI-H/dMMR, TMB-H, or NTRK/RET Gene Fusion) Microsatellite/Mismatch Repair Status: MSS/pMMR NTRK Gene Fusion Status: Did Not Order Test Other Mutations/Biomarkers: No Other Actionable Mutations Tumor Mutational Burden (TMB): TMB-H RET Gene Fusion Status: Negative BRAF [id number] Mutation Status: Did Not Order Test Intent of Therapy: Non-Curative / Palliative Intent, Discussed with Patient

## 2024-08-07 ENCOUNTER — Encounter: Payer: Self-pay | Admitting: Hematology and Oncology

## 2024-08-11 ENCOUNTER — Telehealth: Payer: Self-pay | Admitting: Hematology and Oncology

## 2024-08-11 ENCOUNTER — Inpatient Hospital Stay

## 2024-08-11 ENCOUNTER — Encounter: Payer: Self-pay | Admitting: Hematology and Oncology

## 2024-08-11 DIAGNOSIS — G629 Polyneuropathy, unspecified: Secondary | ICD-10-CM | POA: Diagnosis not present

## 2024-08-11 DIAGNOSIS — Z5111 Encounter for antineoplastic chemotherapy: Secondary | ICD-10-CM | POA: Diagnosis present

## 2024-08-11 DIAGNOSIS — N871 Moderate cervical dysplasia: Secondary | ICD-10-CM | POA: Diagnosis not present

## 2024-08-11 DIAGNOSIS — Z170 Estrogen receptor positive status [ER+]: Secondary | ICD-10-CM | POA: Diagnosis not present

## 2024-08-11 DIAGNOSIS — C773 Secondary and unspecified malignant neoplasm of axilla and upper limb lymph nodes: Secondary | ICD-10-CM | POA: Diagnosis not present

## 2024-08-11 DIAGNOSIS — E8809 Other disorders of plasma-protein metabolism, not elsewhere classified: Secondary | ICD-10-CM | POA: Diagnosis not present

## 2024-08-11 DIAGNOSIS — Z1721 Progesterone receptor positive status: Secondary | ICD-10-CM | POA: Diagnosis not present

## 2024-08-11 DIAGNOSIS — E876 Hypokalemia: Secondary | ICD-10-CM | POA: Diagnosis not present

## 2024-08-11 DIAGNOSIS — C787 Secondary malignant neoplasm of liver and intrahepatic bile duct: Secondary | ICD-10-CM | POA: Diagnosis not present

## 2024-08-11 DIAGNOSIS — L299 Pruritus, unspecified: Secondary | ICD-10-CM | POA: Diagnosis not present

## 2024-08-11 DIAGNOSIS — C7951 Secondary malignant neoplasm of bone: Secondary | ICD-10-CM | POA: Diagnosis not present

## 2024-08-11 DIAGNOSIS — K1379 Other lesions of oral mucosa: Secondary | ICD-10-CM | POA: Diagnosis not present

## 2024-08-11 DIAGNOSIS — E7800 Pure hypercholesterolemia, unspecified: Secondary | ICD-10-CM | POA: Diagnosis not present

## 2024-08-11 DIAGNOSIS — R234 Changes in skin texture: Secondary | ICD-10-CM | POA: Diagnosis not present

## 2024-08-11 DIAGNOSIS — C50911 Malignant neoplasm of unspecified site of right female breast: Secondary | ICD-10-CM

## 2024-08-11 DIAGNOSIS — K1230 Oral mucositis (ulcerative), unspecified: Secondary | ICD-10-CM | POA: Diagnosis not present

## 2024-08-11 DIAGNOSIS — R5383 Other fatigue: Secondary | ICD-10-CM | POA: Diagnosis not present

## 2024-08-11 DIAGNOSIS — K219 Gastro-esophageal reflux disease without esophagitis: Secondary | ICD-10-CM | POA: Diagnosis not present

## 2024-08-11 DIAGNOSIS — Z79811 Long term (current) use of aromatase inhibitors: Secondary | ICD-10-CM | POA: Diagnosis not present

## 2024-08-11 DIAGNOSIS — J90 Pleural effusion, not elsewhere classified: Secondary | ICD-10-CM | POA: Diagnosis not present

## 2024-08-11 DIAGNOSIS — R609 Edema, unspecified: Secondary | ICD-10-CM | POA: Diagnosis not present

## 2024-08-11 DIAGNOSIS — H04209 Unspecified epiphora, unspecified lacrimal gland: Secondary | ICD-10-CM | POA: Diagnosis not present

## 2024-08-11 DIAGNOSIS — C50411 Malignant neoplasm of upper-outer quadrant of right female breast: Secondary | ICD-10-CM

## 2024-08-11 DIAGNOSIS — M810 Age-related osteoporosis without current pathological fracture: Secondary | ICD-10-CM | POA: Diagnosis not present

## 2024-08-11 DIAGNOSIS — M797 Fibromyalgia: Secondary | ICD-10-CM | POA: Diagnosis not present

## 2024-08-11 LAB — CBC WITH DIFFERENTIAL (CANCER CENTER ONLY)
Abs Immature Granulocytes: 0.01 K/uL (ref 0.00–0.07)
Basophils Absolute: 0 K/uL (ref 0.0–0.1)
Basophils Relative: 0 %
Eosinophils Absolute: 0.1 K/uL (ref 0.0–0.5)
Eosinophils Relative: 4 %
HCT: 33.8 % — ABNORMAL LOW (ref 36.0–46.0)
Hemoglobin: 12 g/dL (ref 12.0–15.0)
Immature Granulocytes: 0 %
Lymphocytes Relative: 12 %
Lymphs Abs: 0.3 K/uL — ABNORMAL LOW (ref 0.7–4.0)
MCH: 39.6 pg — ABNORMAL HIGH (ref 26.0–34.0)
MCHC: 35.5 g/dL (ref 30.0–36.0)
MCV: 111.6 fL — ABNORMAL HIGH (ref 80.0–100.0)
Monocytes Absolute: 0.6 K/uL (ref 0.1–1.0)
Monocytes Relative: 20 %
Neutro Abs: 1.8 K/uL (ref 1.7–7.7)
Neutrophils Relative %: 64 %
Platelet Count: 117 K/uL — ABNORMAL LOW (ref 150–400)
RBC: 3.03 MIL/uL — ABNORMAL LOW (ref 3.87–5.11)
RDW: 15.3 % (ref 11.5–15.5)
WBC Count: 2.9 K/uL — ABNORMAL LOW (ref 4.0–10.5)
nRBC: 0 % (ref 0.0–0.2)

## 2024-08-11 LAB — CMP (CANCER CENTER ONLY)
ALT: 23 U/L (ref 0–44)
AST: 54 U/L — ABNORMAL HIGH (ref 15–41)
Albumin: 3 g/dL — ABNORMAL LOW (ref 3.5–5.0)
Alkaline Phosphatase: 348 U/L — ABNORMAL HIGH (ref 38–126)
Anion gap: 6 (ref 5–15)
BUN: 9 mg/dL (ref 8–23)
CO2: 25 mmol/L (ref 22–32)
Calcium: 8.7 mg/dL — ABNORMAL LOW (ref 8.9–10.3)
Chloride: 108 mmol/L (ref 98–111)
Creatinine: 0.35 mg/dL — ABNORMAL LOW (ref 0.44–1.00)
GFR, Estimated: >60 mL/min (ref >60)
Glucose, Bld: 74 mg/dL (ref 70–99)
Potassium: 3.6 mmol/L (ref 3.5–5.1)
Sodium: 139 mmol/L (ref 135–145)
Total Bilirubin: 1.2 mg/dL (ref 0.0–1.2)
Total Protein: 5.6 g/dL — ABNORMAL LOW (ref 6.5–8.1)

## 2024-08-11 NOTE — Telephone Encounter (Signed)
 Scheduled appointments per WQ. Talked with the patient and she is aware of the made appointments.

## 2024-08-12 ENCOUNTER — Ambulatory Visit

## 2024-08-12 ENCOUNTER — Ambulatory Visit: Payer: Self-pay | Admitting: Obstetrics and Gynecology

## 2024-08-12 ENCOUNTER — Inpatient Hospital Stay (HOSPITAL_BASED_OUTPATIENT_CLINIC_OR_DEPARTMENT_OTHER): Admitting: Hematology and Oncology

## 2024-08-12 VITALS — BP 110/70 | HR 95 | Temp 98.1°F | Resp 16 | Ht 63.0 in | Wt 179.4 lb

## 2024-08-12 DIAGNOSIS — C50411 Malignant neoplasm of upper-outer quadrant of right female breast: Secondary | ICD-10-CM

## 2024-08-12 DIAGNOSIS — C773 Secondary and unspecified malignant neoplasm of axilla and upper limb lymph nodes: Secondary | ICD-10-CM

## 2024-08-12 DIAGNOSIS — C50911 Malignant neoplasm of unspecified site of right female breast: Secondary | ICD-10-CM | POA: Diagnosis not present

## 2024-08-12 DIAGNOSIS — Z5111 Encounter for antineoplastic chemotherapy: Secondary | ICD-10-CM | POA: Diagnosis not present

## 2024-08-12 DIAGNOSIS — N871 Moderate cervical dysplasia: Secondary | ICD-10-CM | POA: Insufficient documentation

## 2024-08-12 LAB — CANCER ANTIGEN 27.29: CA 27.29: 759.3 U/mL — ABNORMAL HIGH (ref 0.0–38.6)

## 2024-08-12 LAB — T4: T4, Total: 8.3 ug/dL (ref 4.5–12.0)

## 2024-08-12 LAB — TSH: TSH: 2.51 u[IU]/mL (ref 0.350–4.500)

## 2024-08-12 MED ORDER — SODIUM CHLORIDE 0.9 % IV SOLN: INTRAVENOUS | Status: DC

## 2024-08-12 MED ORDER — SODIUM CHLORIDE 0.9 % IV SOLN
200.0000 mg | Freq: Once | INTRAVENOUS | Status: AC
  Administered 2024-08-12: 200 mg via INTRAVENOUS
  Filled 2024-08-12: qty 200

## 2024-08-12 NOTE — Assessment & Plan Note (Signed)
 We discussed cervical cancer screening and colposcopic biopsies. Per ASCCP guidelines treatment is recommended with excisional procedure. We reviewed both LEEP and CKC procedures. Given comorbidities and low grade ECC, I recommend proceeding with a LEEP via local or sedation. Patient desires LEEP on local anesthesia.  Discussed outpatient procedure. Reviewed that recovery is usually 2 weeks. Reviewed risks including infection, bleeding, damage to surrounding structures.  Reviewed risk of recurrence, is higher with more severe dysplasia.

## 2024-08-12 NOTE — Patient Instructions (Signed)
 [facility] - A DEPT OF [facility]   Discharge Instructions: Thank you for choosing [facility] to provide your oncology and hematology care.   If you have a lab appointment with the Cancer Center, please go directly to the Cancer Center and check in at the registration area.   Wear comfortable clothing and clothing appropriate for easy access to any Portacath or PICC line.   We strive to give you quality time with your provider. You may need to reschedule your appointment if you arrive late (15 or more minutes).  Arriving late affects you and other patients whose appointments are after yours.  Also, if you miss three or more appointments without notifying the office, you may be dismissed from the clinic at the provider's discretion.      For prescription refill requests, have your pharmacy contact our office and allow 72 hours for refills to be completed.    Today you received the following chemotherapy and/or immunotherapy agents: Pembrolizumab Rande Lawman)      To help prevent nausea and vomiting after your treatment, we encourage you to take your nausea medication as directed.  BELOW ARE SYMPTOMS THAT SHOULD BE REPORTED IMMEDIATELY: *FEVER GREATER THAN 100.4 F (38 C) OR HIGHER *CHILLS OR SWEATING *NAUSEA AND VOMITING THAT IS NOT CONTROLLED WITH YOUR NAUSEA MEDICATION *UNUSUAL SHORTNESS OF BREATH *UNUSUAL BRUISING OR BLEEDING *URINARY PROBLEMS (pain or burning when urinating, or frequent urination) *BOWEL PROBLEMS (unusual diarrhea, constipation, pain near the anus) TENDERNESS IN MOUTH AND THROAT WITH OR WITHOUT PRESENCE OF ULCERS (sore throat, sores in mouth, or a toothache) UNUSUAL RASH, SWELLING OR PAIN  UNUSUAL VAGINAL DISCHARGE OR ITCHING   Items with * indicate a potential emergency and should be followed up as soon as possible or go to the Emergency Department if any problems should occur.  Please show the CHEMOTHERAPY ALERT CARD  or IMMUNOTHERAPY ALERT CARD at check-in to the Emergency Department and triage nurse.  Should you have questions after your visit or need to cancel or reschedule your appointment, please contact [facility] - A DE[facility]  Dept: [phone]  and follow the prompts.  Office hours are 8:00 a.m. to 4:30 p.m. Monday - Friday. Please note that voicemails left after 4:00 p.m. may not be returned until the following business day.  We are closed weekends and major holidays. You have access to a nurse at all times for urgent questions. Please call the main number to the clinic Dept: [phone] and follow the prompts.   For any non-urgent questions, you may also contact your provider using MyChart. We now offer e-Visits for anyone 75 and older to request care online for non-urgent symptoms. For details visit [email or web address].   Also download the MyChart app! Go to the app store, search "MyChart", open the app, select , and log in with your MyChart username and password.

## 2024-08-12 NOTE — Progress Notes (Signed)
 Patient Care Team: Billy Philippe SAUNDERS, NP as PCP - General (Family Medicine) Zenaida Morene PARAS, MD as PCP - Cardiology (Cardiology) Izell Domino, MD as Attending Physician (Radiation Oncology) Crawford, Morna Pickle, NP as Nurse Practitioner (Hematology and Oncology) Signe Mitzie LABOR, MD as Consulting Physician (General Surgery) Janit Thresa HERO, DPM as Consulting Physician (Podiatry) Lennard Lesta FALCON, MD as Consulting Physician (Gastroenterology) Odean Potts, MD as Attending Physician (Hematology and Oncology)  DIAGNOSIS:  Encounter Diagnosis  Name Primary?   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) Yes    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)   06/21/2022 - 08/03/2022 Chemotherapy   Patient is on Treatment Plan : BREAST METASTATIC fam-trastuzumab deruxtecan-nxki  (Enhertu ) q21d     06/21/2022 - 04/19/2023 Chemotherapy   Patient is on Treatment Plan : BREAST METASTATIC Fam-Trastuzumab Deruxtecan-nxki  (Enhertu ) (5.4) q21d     05/18/2023 - 07/06/2023 Chemotherapy   Patient is on Treatment Plan : BREAST METASTATIC Sacituzumab govitecan -hziy (Trodelvy ) D1,8 q21d     10/16/2023 - 01/05/2024 Chemotherapy   Patient is on Treatment Plan : BREAST METASTATIC Eribulin  D1,8 q21d     01/21/2024 - 03/17/2024 Chemotherapy   Patient is on Treatment Plan : BREAST Liposomal Doxorubicin  (50) q28d     04/15/2024 - 07/07/2024 Chemotherapy   Patient is on Treatment Plan : BREAST Datopotamab Deruxtecan-dlnk  (Datroway ) q21d     Malignant neoplasm of upper-outer quadrant of right breast in female, estrogen receptor positive (HCC)  06/21/2016 Initial Diagnosis   Malignant neoplasm of upper-outer quadrant of right breast in female, estrogen receptor positive (HCC)   06/21/2016 Initial Biopsy   Right breast biopsy, 10 oclock: IDC,  grade 3, ER+(95%), PR+(80%),Ki67 20%, HER-2 negative (ratio 0.67). Right axilla core biopsy: carcinoma, grade 3, ER+(100%), PR+(90%), Ki67 25%, HER-2 negative (ratio 1.13).    07/17/2016 - 11/06/2016 Neo-Adjuvant Chemotherapy   Received 2 cycles of Doxorubicin  and Cyclophosphamide , then transitioned to weekly Paclitaxel  (due to repeated febrile neutropenia) x 7 cycles, stopped early due to peripheral neuropathy, then completed her final 2 cycles of Doxorubicin  and Cyclophosphamide .    12/19/2016 Surgery   Right breast lumpectomy ([facility]): IDC, grade 2, 1.6cm and 0.3cm, margins negative, 3 SLN positive for metastatic carcinoma.     12/26/2016 Surgery   ALND: metastatic carcinoma in one of 20 lymph nodes, and three nodes from previous lumpectomy.  Four positive nodes, consistent with pN2a.   02/07/2017 - 03/21/2017 Radiation Therapy   Adjuvant radiation Audry): 1) Right breast and nodes - 4 field: 50 Gy in 25 fractions. IM NODES: >95% receive at least 45Gy/76fx. 50Gy to SCLV/PAB @ 2Gy /fraction x 25 fractions. 2) Right breast boost: 10 Gy in 5 fractions   04/2017 -  Anti-estrogen oral therapy   Anastrozole  1mg  daily.  Bone density 03/23/2017 finds T score of -2.6, osteoporosis, plan to start Prolia  following dental clearance Anastrozole  stopped 01/16/18 Exemestane  25 mg daily 02/12/18  On [name], trial randomized to endocrine therapy alone   05/27/2020 Progression   chest CT scan 05/27/2020 shows bulky mediastinal and right hilar lymphadenopathy with right pleural nodules and a small right pleural effusion, no evidence of liver or bone involvement             (a) biopsy of right breast mass 06/02/2020 shows invasive ductal carcinoma, estrogen and progesterone receptor positive, HER-2 not amplified, with an MIB-1 of  40%   06/17/2020 Treatment Plan Change    fulvestrant  to start 06/17/2020             (a) palbociclib  to start 06/17/2020 at 125 mg daily 21 days on 7 days off             (b)  palbociclib  decreased to 125mg  every other day x 11 doses on 07/23/2020             (c) palbociclib  dose decreased to100 mg daily, 21/7, starting with September cycle             (d) Palbociclib  decreased to 75mg  daily beginning with March cycle due to oral ulcers             (e) palbociclib  discontinued November 2022 with progression   09/14/2021 PET scan   PET scan 09/14/2021 shows progression in liver and bone   10/05/2021 Pathology Results   liver biopsy 10/05/2021 confirms metastatic carcinoma, estrogen receptor strongly positive, progesterone receptor and HER2 negative, with an MIB-1 of 30%.   10/2021 Treatment Plan Change   fulvestrant  continued, Xgeva  added, palbociclib  changed to abemaciclib  November 2022 -Dose Decreased to 50mg  PO BID Daily   03/07/2022 Miscellaneous   [facility] molecular testing: ER positive, ER positive, MTAP: Detected, ESR 1 negative, BRCA 1 and 2 negative, MSI stable, PD-L1 negative, PIK 3 CA negative, PR negative,   06/21/2022 - 08/03/2022 Chemotherapy   Patient is on Treatment Plan : BREAST METASTATIC fam-trastuzumab deruxtecan-nxki  (Enhertu ) q21d     06/21/2022 - 04/19/2023 Chemotherapy   Patient is on Treatment Plan : BREAST METASTATIC Fam-Trastuzumab Deruxtecan-nxki  (Enhertu ) (5.4) q21d     01/25/2023 Cancer Staging   Staging form: Breast, AJCC 7th Edition - Pathologic: Stage IV (M1) - Signed by Crawford Morna Pickle, NP on 01/25/2023   05/18/2023 - 07/06/2023 Chemotherapy   Patient is on Treatment Plan : BREAST METASTATIC Sacituzumab govitecan -hziy (Trodelvy ) D1,8 q21d     07/23/2023 -  Chemotherapy   Xeloda  1000 mg bid 14 days on and 7 days off   08/27/2023 Procedure   Y90 hepatic radio embolization   10/16/2023 - 01/05/2024 Chemotherapy   Patient is on Treatment Plan : BREAST METASTATIC Eribulin  D1,8 q21d     01/21/2024 - 03/17/2024 Chemotherapy   Patient is on Treatment Plan : BREAST Liposomal Doxorubicin  (50) q28d     04/15/2024 - 07/07/2024 Chemotherapy    Patient is on Treatment Plan : BREAST Datopotamab Deruxtecan-dlnk  (Datroway ) q21d     08/12/2024 -  Chemotherapy   Patient is on Treatment Plan : BREAST Pembrolizumab  (200) q21d x 24 months     Breast cancer, stage 4 (HCC)  03/09/2022 Initial Diagnosis   Breast cancer, stage 4 (HCC)   06/21/2022 - 08/03/2022 Chemotherapy   Patient is on Treatment Plan : BREAST METASTATIC fam-trastuzumab deruxtecan-nxki  (Enhertu ) q21d     06/21/2022 - 04/19/2023 Chemotherapy   Patient is on Treatment Plan : BREAST METASTATIC Fam-Trastuzumab Deruxtecan-nxki  (Enhertu ) (5.4) q21d     05/18/2023 - 07/06/2023 Chemotherapy   Patient is on Treatment Plan : BREAST METASTATIC Sacituzumab govitecan -hziy (Trodelvy ) D1,8 q21d     10/16/2023 - 01/05/2024 Chemotherapy   Patient is on Treatment Plan : BREAST METASTATIC Eribulin  D1,8 q21d     01/21/2024 - 03/17/2024 Chemotherapy   Patient is on Treatment Plan : BREAST Liposomal Doxorubicin  (50) q28d     04/15/2024 - 07/07/2024 Chemotherapy   Patient is on Treatment Plan : BREAST Datopotamab Deruxtecan-dlnk  (Datroway ) q21d  CHIEF COMPLIANT: Cycle 1 pembrolizumab   HISTORY OF PRESENT ILLNESS:  History of Present Illness April [name] is a 63 year old female with inflammatory breast disease who presents with worsening symptoms and side effects from treatment.  She experiences painful mouth sores that develop during meals, appearing as blisters throughout the mouth, including the back of the cheeks. Drinking large amounts of liquid exacerbates the pain, though water is less bothersome. She avoids citrus drinks.  Her entire right breast is painful, with burning sensations particularly around the nipple, and severe itching. The right breast is more affected than the left. She uses Benadryl  ointment and lotion for relief and finds ice packs helpful.  Her neutrophil count is 1.8. She reports fatigue and is unsure if it is related to thyroid  function, as she experiences general  fatigue.     ALLERGIES:  is allergic to datopotamab deruxtecan, eribulin  mesylate, codeine , cymbalta  [duloxetine  hcl], hydrocodone , ultram  [tramadol  hcl], and venlafaxine .  MEDICATIONS:  Current Outpatient Medications  Medication Sig Dispense Refill   buPROPion  (WELLBUTRIN  XL) 150 MG 24 hr tablet Take 1 tablet (150 mg total) by mouth daily. 90 tablet 1   cromolyn (OPTICROM) 4 % ophthalmic solution SMARTSIG:In Eye(s)     cyclobenzaprine  (FLEXERIL ) 5 MG tablet Take 1 tablet (5 mg total) by mouth 3 (three) times daily as needed for muscle spasms. 30 tablet 0   Denosumab  (XGEVA  Leonore) Inject 120 mg into the skin every 3 (three) months.     KLOR-CON  M10 10 MEQ tablet TAKE 1 TABLET BY MOUTH EVERY DAY 90 tablet 1   loratadine  (CLARITIN ) 10 MG tablet Take 10 mg by mouth daily.     magic mouthwash (nystatin , lidocaine , diphenhydrAMINE , alum & mag hydroxide) suspension Take 5 mLs by mouth 4 (four) times daily as needed for mouth pain. Suspension contains equal amounts of Maalox Extra Strength, nystatin , diphenhydramine  and lidocaine . 240 mL 2   Morphine  Sulfate (MORPHINE  CONCENTRATE) 10 mg / 0.5 ml concentrated solution Take 0.5 mLs (10 mg total) by mouth every 2 (two) hours as needed. 120 mL 0   naproxen sodium (ALEVE) 220 MG tablet Take 440-660 mg by mouth 2 (two) times daily as needed (pain).     omeprazole  (PRILOSEC) 20 MG capsule Take 20 mg by mouth daily.     rizatriptan  (MAXALT ) 5 MG tablet Take 1 tablet (5 mg total) by mouth as needed for migraine. May repeat in 2 hours if needed 10 tablet 0   valACYclovir  (VALTREX ) 1000 MG tablet Take 1 tablet (1,000 mg total) by mouth 2 (two) times daily. 180 tablet 4   XIIDRA 5 % SOLN      Current Facility-Administered Medications  Medication Dose Route Frequency Provider Last Rate Last Admin   lidocaine  (PF) (XYLOCAINE ) 1 % injection 10 mL  10 mL Infiltration Once        Facility-Administered Medications Ordered in Other Visits  Medication Dose Route  Frequency Provider Last Rate Last Admin   0.9 %  sodium chloride  infusion   Intravenous Continuous Theo Reither, MD 10 mL/hr at 08/12/24 1009 New Bag at 08/12/24 1009   pembrolizumab  (KEYTRUDA ) 200 mg in sodium chloride  0.9 % 50 mL chemo infusion  200 mg Intravenous Once Haliey Romberg, MD 116 mL/hr at 08/12/24 1025 200 mg at 08/12/24 1025    PHYSICAL EXAMINATION: ECOG PERFORMANCE STATUS: 1 - Symptomatic but completely ambulatory  Vitals:   08/12/24 0929  BP: 110/70  Pulse: 95  Resp: 16  Temp: 98.1 F (36.7 C)  SpO2: 98%   Filed Weights   08/12/24 0929  Weight: 179 lb 6.4 oz (81.4 kg)    Physical Exam Right arm lymphedema, bilateral breast tenderness from metastases  (exam performed in the presence of a chaperone)  LABORATORY DATA:  I have reviewed the data as listed    Latest Ref Rng & Units 08/11/2024    3:09 PM 07/23/2024   12:09 PM 07/07/2024   10:32 AM  CMP  Glucose 70 - 99 mg/dL 74  879  88   BUN 8 - 23 mg/dL 9  9  11    Creatinine 0.44 - 1.00 mg/dL 9.64  9.56  9.53   Sodium 135 - 145 mmol/L 139  138  139   Potassium 3.5 - 5.1 mmol/L 3.6  3.5  3.9   Chloride 98 - 111 mmol/L 108  108  109   CO2 22 - 32 mmol/L 25  27  26    Calcium 8.9 - 10.3 mg/dL 8.7  8.5  8.4   Total Protein 6.5 - 8.1 g/dL 5.6  5.4  5.4   Total Bilirubin 0.0 - 1.2 mg/dL 1.2  1.2  1.0   Alkaline Phos 38 - 126 U/L 348  348  368   AST 15 - 41 U/L 54  47  50   ALT 0 - 44 U/L 23  24  26      Lab Results  Component Value Date   WBC 2.9 (L) 08/11/2024   HGB 12.0 08/11/2024   HCT 33.8 (L) 08/11/2024   MCV 111.6 (H) 08/11/2024   PLT 117 (L) 08/11/2024   NEUTROABS 1.8 08/11/2024    ASSESSMENT & PLAN:  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) 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 Chest wall nodules: 02/04/2024: [facility] testing identified PTEN deletion suggesting benefit to Capivasertib with fulvestrant .  (ER +95%, PR +95%, HER2 negative null score 0, MSI: Stable, TMB 5, genomic LOH: High   CT CAP: Liver mets 2.9 cm (was 1.7 cm), 2.5 cm (was 2.7 cm) others stable, enl sub pleural nodule 0.8 cm, increase in Left supraclav LN and pe vascular LN), stable bone mets ---------------------------------------------------------------------------------------------------------------------------------------- CT CAP 07/21/2024: New skin thickening left breast and newly enlarged left axillary and subpectoral lymph nodes, slight interval enlargement of liver lesions moderate right pleural effusion and interval enlargement of right pleural nodules, groundglass opacities unchanged bone metastases   Guardant360 07/23/2024: T p53 mutations (no FDA approved therapies) TMB 17.0 8 mut/Mb   Current treatment: Pembrolizumab  cycle 1 Monitoring closely for toxicities Return to clinic in 3 weeks for cycle 2  Persistent mouth sores: From prior treatment Itching of the skin related to metastatic cancer: Advised her to apply Benadryl  cream  ------------------------------------- Assessment and Plan Assessment & Plan Metastatic inflammatory breast cancer, right breast Aggressive progression with increased tumor markers and extensive disease on CT. Significant pain, burning, and itching, especially around the nipple area. - Continue Keytruda  treatment, monitor for thyroid  dysfunction. - Monitor TSH levels at each treatment session. - Advise Benadryl  ointment and lotion for itching. - Recommend ice packs for pain and itching relief.  Oral mucositis secondary to cancer therapy Painful mouth sores exacerbated by eating and drinking.  Epiphora (excessive tearing) of both eyes Likely a side effect of cancer treatment, may resolve in a few months.  Edema, likely lymphedema Intermittent  abdominal swelling, possibly lymphedema-related.  Abnormal liver function tests with hypoalbuminemia and low protein Elevated AST, ALT, alkaline phosphatase, low protein, and hypoalbuminemia consistent with condition and treatment effects.  Precancerous cervical lesion (moderate cervical dysplasia) Biopsy confirmed moderate cervical dysplasia. Neutrophil count adequate for procedure. - Proceed with in-office excision procedure. - Ensure communication with gynecologist Dr. Vera Pa regarding condition and treatment plan.      No orders of the defined types were placed in this encounter.  The patient has a good understanding of the overall plan. she agrees with it. she will call with any problems that may develop before the next visit here. Total time spent: 30 mins including face to face time and time spent for planning, charting and co-ordination of care   Naomi MARLA Chad, MD 08/12/24

## 2024-08-12 NOTE — Assessment & Plan Note (Signed)
 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 Chest wall nodules: 02/04/2024: [facility] testing identified PTEN deletion suggesting benefit to Capivasertib with fulvestrant .  (ER +95%, PR +95%, HER2 negative null score 0, MSI: Stable, TMB 5, genomic LOH: High   CT CAP: Liver mets 2.9 cm (was 1.7 cm), 2.5 cm (was 2.7 cm) others stable, enl sub pleural nodule 0.8 cm, increase in Left supraclav LN and pe vascular LN), stable bone mets ---------------------------------------------------------------------------------------------------------------------------------------- CT CAP 07/21/2024: New skin thickening left breast and newly enlarged left axillary and subpectoral lymph nodes, slight interval enlargement of liver lesions moderate right pleural effusion and interval enlargement of right pleural nodules, groundglass opacities unchanged bone metastases   Guardant360 07/23/2024: T p53 mutations (no FDA approved therapies) TMB 17.0 8 mut/Mb   Current treatment: Pembrolizumab  cycle 1 Monitoring closely for toxicities Return to clinic in 3 weeks for cycle 2  Persistent mouth sores: From prior treatment Itching of the skin related to metastatic cancer: Advised her to apply Benadryl  cream

## 2024-08-13 ENCOUNTER — Telehealth: Payer: Self-pay

## 2024-08-13 ENCOUNTER — Encounter: Payer: Self-pay | Admitting: Hematology and Oncology

## 2024-08-13 NOTE — Telephone Encounter (Signed)
-----   Message from Nurse Dawna HERO sent at 08/12/2024 11:27 AM EDT ----- Regarding: Dr. Odean 1st tx f/u call Dr. Odean 1st tx f/u call - switched to new treatment -  Keytruda  -tolerated well

## 2024-08-14 LAB — GUARDANT 360

## 2024-08-16 ENCOUNTER — Inpatient Hospital Stay

## 2024-08-17 ENCOUNTER — Other Ambulatory Visit: Payer: Self-pay | Admitting: Hematology and Oncology

## 2024-08-18 ENCOUNTER — Ambulatory Visit: Admitting: Hematology and Oncology

## 2024-08-18 ENCOUNTER — Ambulatory Visit

## 2024-08-18 ENCOUNTER — Other Ambulatory Visit

## 2024-08-18 NOTE — Telephone Encounter (Signed)
 Patient was prescribed this over a year ago for short term.  Labs are within normal limits. Needs to discuss at next visit with provider.

## 2024-08-19 ENCOUNTER — Encounter: Payer: Self-pay | Admitting: Hematology and Oncology

## 2024-08-25 ENCOUNTER — Encounter: Payer: Self-pay | Admitting: Obstetrics and Gynecology

## 2024-09-02 ENCOUNTER — Other Ambulatory Visit: Payer: Self-pay | Admitting: *Deleted

## 2024-09-02 ENCOUNTER — Other Ambulatory Visit (HOSPITAL_COMMUNITY): Payer: Self-pay

## 2024-09-02 ENCOUNTER — Inpatient Hospital Stay

## 2024-09-02 ENCOUNTER — Inpatient Hospital Stay: Admitting: Adult Health

## 2024-09-02 ENCOUNTER — Encounter: Payer: Self-pay | Admitting: Adult Health

## 2024-09-02 ENCOUNTER — Inpatient Hospital Stay: Admitting: Dietician

## 2024-09-02 ENCOUNTER — Encounter: Payer: Self-pay | Admitting: Hematology and Oncology

## 2024-09-02 VITALS — BP 106/68 | HR 100 | Temp 97.8°F | Resp 16 | Ht 63.0 in | Wt 175.8 lb

## 2024-09-02 DIAGNOSIS — C50411 Malignant neoplasm of upper-outer quadrant of right female breast: Secondary | ICD-10-CM | POA: Diagnosis not present

## 2024-09-02 DIAGNOSIS — Z170 Estrogen receptor positive status [ER+]: Secondary | ICD-10-CM

## 2024-09-02 DIAGNOSIS — E876 Hypokalemia: Secondary | ICD-10-CM

## 2024-09-02 DIAGNOSIS — C50911 Malignant neoplasm of unspecified site of right female breast: Secondary | ICD-10-CM

## 2024-09-02 DIAGNOSIS — Z5111 Encounter for antineoplastic chemotherapy: Secondary | ICD-10-CM | POA: Diagnosis not present

## 2024-09-02 LAB — CMP (CANCER CENTER ONLY)
ALT: 28 U/L (ref 0–44)
AST: 67 U/L — ABNORMAL HIGH (ref 15–41)
Albumin: 2.9 g/dL — ABNORMAL LOW (ref 3.5–5.0)
Alkaline Phosphatase: 376 U/L — ABNORMAL HIGH (ref 38–126)
Anion gap: 7 (ref 5–15)
BUN: 11 mg/dL (ref 8–23)
CO2: 25 mmol/L (ref 22–32)
Calcium: 8.2 mg/dL — ABNORMAL LOW (ref 8.9–10.3)
Chloride: 105 mmol/L (ref 98–111)
Creatinine: 0.45 mg/dL (ref 0.44–1.00)
GFR, Estimated: >60 mL/min (ref >60)
Glucose, Bld: 115 mg/dL — ABNORMAL HIGH (ref 70–99)
Potassium: 3 mmol/L — ABNORMAL LOW (ref 3.5–5.1)
Sodium: 137 mmol/L (ref 135–145)
Total Bilirubin: 1.2 mg/dL (ref 0.0–1.2)
Total Protein: 5.8 g/dL — ABNORMAL LOW (ref 6.5–8.1)

## 2024-09-02 LAB — CBC WITH DIFFERENTIAL (CANCER CENTER ONLY)
Abs Immature Granulocytes: 0.01 K/uL (ref 0.00–0.07)
Basophils Absolute: 0 K/uL (ref 0.0–0.1)
Basophils Relative: 1 %
Eosinophils Absolute: 0.1 K/uL (ref 0.0–0.5)
Eosinophils Relative: 5 %
HCT: 35.6 % — ABNORMAL LOW (ref 36.0–46.0)
Hemoglobin: 12.5 g/dL (ref 12.0–15.0)
Immature Granulocytes: 0 %
Lymphocytes Relative: 12 %
Lymphs Abs: 0.3 K/uL — ABNORMAL LOW (ref 0.7–4.0)
MCH: 38.5 pg — ABNORMAL HIGH (ref 26.0–34.0)
MCHC: 35.1 g/dL (ref 30.0–36.0)
MCV: 109.5 fL — ABNORMAL HIGH (ref 80.0–100.0)
Monocytes Absolute: 0.5 K/uL (ref 0.1–1.0)
Monocytes Relative: 20 %
Neutro Abs: 1.7 K/uL (ref 1.7–7.7)
Neutrophils Relative %: 62 %
Platelet Count: 101 K/uL — ABNORMAL LOW (ref 150–400)
RBC: 3.25 MIL/uL — ABNORMAL LOW (ref 3.87–5.11)
RDW: 14.7 % (ref 11.5–15.5)
WBC Count: 2.7 K/uL — ABNORMAL LOW (ref 4.0–10.5)
nRBC: 0 % (ref 0.0–0.2)

## 2024-09-02 LAB — TSH: TSH: 2.88 u[IU]/mL (ref 0.350–4.500)

## 2024-09-02 MED ORDER — SODIUM CHLORIDE 0.9 % IV SOLN: INTRAVENOUS | Status: DC

## 2024-09-02 MED ORDER — SODIUM CHLORIDE 0.9 % IV SOLN
200.0000 mg | Freq: Once | INTRAVENOUS | Status: AC
  Administered 2024-09-02: 200 mg via INTRAVENOUS
  Filled 2024-09-02: qty 200

## 2024-09-02 MED ORDER — DENOSUMAB 120 MG/1.7ML ~~LOC~~ SOLN
120.0000 mg | Freq: Once | SUBCUTANEOUS | Status: AC
  Administered 2024-09-02: 120 mg via SUBCUTANEOUS
  Filled 2024-09-02: qty 1.7

## 2024-09-02 MED ORDER — SODIUM CHLORIDE 0.9 % IV SOLN: Freq: Once | INTRAVENOUS | Status: AC

## 2024-09-02 MED ORDER — POTASSIUM CHLORIDE ER 10 MEQ PO TBCR
10.0000 meq | EXTENDED_RELEASE_TABLET | Freq: Two times a day (BID) | ORAL | 0 refills | Status: DC
  Filled 2024-09-02: qty 60, 30d supply, fill #0

## 2024-09-02 NOTE — Patient Instructions (Signed)
 [facility] - A DEPT OF [facility]  Discharge Instructions: Thank you for choosing [facility] to provide your oncology and hematology care.   If you have a lab appointment with the Cancer Center, please go directly to the Cancer Center and check in at the registration area.   Wear comfortable clothing and clothing appropriate for easy access to any Portacath or PICC line.   We strive to give you quality time with your provider. You may need to reschedule your appointment if you arrive late (15 or more minutes).  Arriving late affects you and other patients whose appointments are after yours.  Also, if you miss three or more appointments without notifying the office, you may be dismissed from the clinic at the provider's discretion.      For prescription refill requests, have your pharmacy contact our office and allow 72 hours for refills to be completed.    Today you received the following chemotherapy and/or immunotherapy agents: Pembrolizumab  (Keytruda )    To help prevent nausea and vomiting after your treatment, we encourage you to take your nausea medication as directed.  BELOW ARE SYMPTOMS THAT SHOULD BE REPORTED IMMEDIATELY: *FEVER GREATER THAN 100.4 F (38 C) OR HIGHER *CHILLS OR SWEATING *NAUSEA AND VOMITING THAT IS NOT CONTROLLED WITH YOUR NAUSEA MEDICATION *UNUSUAL SHORTNESS OF BREATH *UNUSUAL BRUISING OR BLEEDING *URINARY PROBLEMS (pain or burning when urinating, or frequent urination) *BOWEL PROBLEMS (unusual diarrhea, constipation, pain near the anus) TENDERNESS IN MOUTH AND THROAT WITH OR WITHOUT PRESENCE OF ULCERS (sore throat, sores in mouth, or a toothache) UNUSUAL RASH, SWELLING OR PAIN  UNUSUAL VAGINAL DISCHARGE OR ITCHING   Items with * indicate a potential emergency and should be followed up as soon as possible or go to the Emergency Department if any problems should occur.  Please show the CHEMOTHERAPY ALERT CARD or  IMMUNOTHERAPY ALERT CARD at check-in to the Emergency Department and triage nurse.  Should you have questions after your visit or need to cancel or reschedule your appointment, please contact [facility] - A DEPT OF [facility]  Dept: [phone]  and follow the prompts.  Office hours are 8:00 a.m. to 4:30 p.m. Monday - Friday. Please note that voicemails left after 4:00 p.m. may not be returned until the following business day.  We are closed weekends and major holidays. You have access to a nurse at all times for urgent questions. Please call the main number to the clinic Dept: [phone] and follow the prompts.   For any non-urgent questions, you may also contact your provider using MyChart. We now offer e-Visits for anyone 63 and older to request care online for non-urgent symptoms. For details visit [email or web address].   Also download the MyChart app! Go to the app store, search MyChart, open the app, select [facility], and log in with your MyChart username and password.  Denosumab  Injection (Oncology) What is this medication? DENOSUMAB  (den oh SUE mab) prevents weakened bones caused by cancer. It may also be used to treat noncancerous bone tumors that cannot be removed by surgery. It can also be used to treat high calcium levels in the blood caused by cancer. It works by blocking a protein that causes bones to break down quickly. This slows down the release of calcium from bones, which lowers calcium levels in your blood. It also makes your bones stronger and less likely to break (fracture). This medicine  may be used for other purposes; ask your health care provider or pharmacist if you have questions. COMMON BRAND NAME(S): XGEVA  What should I tell my care team before I take this medication? They need to know if you have any of these conditions: Dental disease Having surgery or tooth extraction Infection Kidney disease Low levels of calcium or  vitamin D  in the blood Malnutrition On hemodialysis Skin conditions or sensitivity Thyroid  or parathyroid disease An unusual reaction to denosumab , other medications, foods, dyes, or preservatives Pregnant or trying to get pregnant Breast-feeding How should I use this medication? This medication is for injection under the skin. It is given by your care team in a hospital or clinic setting. A special MedGuide will be given to you before each treatment. Be sure to read this information carefully each time. Talk to your care team about the use of this medication in children. While it may be prescribed for children as young as 13 years for selected conditions, precautions do apply. Overdosage: If you think you have taken too much of this medicine contact a poison control center or emergency room at once. NOTE: This medicine is only for you. Do not share this medicine with others. What if I miss a dose? Keep appointments for follow-up doses. It is important not to miss your dose. Call your care team if you are unable to keep an appointment. What may interact with this medication? Do not take this medication with any of the following: Other medications containing denosumab  This medication may also interact with the following: Medications that lower your chance of fighting infection Steroid medications, such as prednisone  or cortisone This list may not describe all possible interactions. Give your health care provider a list of all the medicines, herbs, non-prescription drugs, or dietary supplements you use. Also tell them if you smoke, drink alcohol, or use illegal drugs. Some items may interact with your medicine. What should I watch for while using this medication? Your condition will be monitored carefully while you are receiving this medication. You may need blood work while taking this medication. This medication may increase your risk of getting an infection. Call your care team for advice  if you get a fever, chills, sore throat, or other symptoms of a cold or flu. Do not treat yourself. Try to avoid being around people who are sick. You should make sure you get enough calcium and vitamin D  while you are taking this medication, unless your care team tells you not to. Discuss the foods you eat and the vitamins you take with your care team. Some people who take this medication have severe bone, joint, or muscle pain. This medication may also increase your risk for jaw problems or a broken thigh bone. Tell your care team right away if you have severe pain in your jaw, bones, joints, or muscles. Tell your care team if you have any pain that does not go away or that gets worse. Talk to your care team if you may be pregnant. Serious birth defects can occur if you take this medication during pregnancy and for 5 months after the last dose. You will need a negative pregnancy test before starting this medication. Contraception is recommended while taking this medication and for 5 months after the last dose. Your care team can help you find the option that works for you. What side effects may I notice from receiving this medication? Side effects that you should report to your care team as soon as possible:  Allergic reactions--skin rash, itching, hives, swelling of the face, lips, tongue, or throat Bone, joint, or muscle pain Low calcium level--muscle pain or cramps, confusion, tingling, or numbness in the hands or feet Osteonecrosis of the jaw--pain, swelling, or redness in the mouth, numbness of the jaw, poor healing after dental work, unusual discharge from the mouth, visible bones in the mouth Side effects that usually do not require medical attention (report to your care team if they continue or are bothersome): Cough Diarrhea Fatigue Headache Nausea This list may not describe all possible side effects. Call your doctor for medical advice about side effects. You may report side effects to FDA  at 1-800-FDA-1088. Where should I keep my medication? This medication is given in a hospital or clinic. It will not be stored at home. NOTE: This sheet is a summary. It may not cover all possible information. If you have questions about this medicine, talk to your doctor, pharmacist, or health care provider.  2024 [employer]vier/Gold Standard (2022-04-12 00:00:00)
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
## 2024-09-02 NOTE — Assessment & Plan Note (Addendum)
 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  (ER +95%, PR +95%, HER2 negative null score 0, MSI: Stable, TMB 5, genomic LOH: High   CT CAP: Liver mets 2.9 cm (was 1.7 cm), 2.5 cm (was 2.7 cm) others stable, enl sub pleural nodule 0.8 cm, increase in Left supraclav LN and pe vascular LN), stable bone mets  -------------------------------------------------------- Current treatment:Keytruda  Echocardiogram 04/09/2024: EF 60 to 65%   Toxicities: Fatigue: energy conservation  Assessment and Plan Assessment & Plan Malignant neoplasm of breast Undergoing Keytruda  treatment. Reports exhaustion and leg symptoms with fluctuating severity. Armpit swelling noted, possibly improving. Blood counts stable. Normal kidney and liver functions. - Continue Keytruda  treatment as scheduled. - Evaluate thyroid  function with every other treatment. - Monitor axilla swelling for changes.  Hypokalemia Potassium level at 3.0, contributing to symptoms. Off supplements due to lack of refills. - Prescribe potassium supplements twice daily until next visit. - Arrange potassium supplement delivery during treatment.

## 2024-09-02 NOTE — Progress Notes (Signed)
 Nutrition Follow-up:  Patient with metastatic breast cancer. Datroway  (last received 8/4), stopped d/t side effects. She is currently receiving keytruda  q21d. Patient is followed by Dr Odean   Met with patient in infusion. She reports doing well. Tolerating immunotherapy without NIS. Patient does feel more fatigued. Notes mouth ulcers have mostly resolved after d/c of datroway  7 weeks ago. Reports painful fluid-filled ulcers would start popping up while eating. Says she lived off of soup for weeks.   Her appetite is good. Patient enjoying eating again. Recalls having pizza for the first time over the weekend. Tolerated a medium pepperoni without pain. She has not been drinking Ensure. Patient growing tired of them and eating more. She continues adding Artist to coffee.     Medications: reviewed   Labs: K 3.0, albumin  2.9  Anthropometrics: Wt 175 lb 12.8 oz today down 5.9% in 6 weeks - severe for time frame  9/9 - 179 lb 6.4 8/4 - 186 lb 14.4   NUTRITION DIAGNOSIS: Unintentional wt loss    INTERVENTION:  Encourage high calorie high protein foods Recommend protein source at every meal Continue adding carnation to coffee Pt will consider resuming daily Ensure with additional wt loss    MONITORING, EVALUATION, GOAL: wt trends, intake   NEXT VISIT: Tuesday November 11 during infusion

## 2024-09-03 LAB — CANCER ANTIGEN 27.29: CA 27.29: 1058.9 U/mL — ABNORMAL HIGH (ref 0.0–38.6)

## 2024-09-06 ENCOUNTER — Inpatient Hospital Stay

## 2024-09-08 ENCOUNTER — Other Ambulatory Visit

## 2024-09-08 ENCOUNTER — Ambulatory Visit: Admitting: Hematology and Oncology

## 2024-09-08 ENCOUNTER — Ambulatory Visit

## 2024-09-15 ENCOUNTER — Other Ambulatory Visit (HOSPITAL_COMMUNITY)

## 2024-09-15 ENCOUNTER — Other Ambulatory Visit: Payer: Self-pay

## 2024-09-15 ENCOUNTER — Telehealth: Payer: Self-pay

## 2024-09-15 ENCOUNTER — Other Ambulatory Visit: Payer: Self-pay | Admitting: Radiology

## 2024-09-15 ENCOUNTER — Encounter: Payer: Self-pay | Admitting: Hematology and Oncology

## 2024-09-15 ENCOUNTER — Ambulatory Visit (HOSPITAL_COMMUNITY)
Admission: RE | Admit: 2024-09-15 | Discharge: 2024-09-15 | Disposition: A | Discharge status: Home / Self Care | Source: Ambulatory Visit | Attending: Physician Assistant | Admitting: Physician Assistant

## 2024-09-15 ENCOUNTER — Ambulatory Visit (HOSPITAL_COMMUNITY)
Admission: RE | Admit: 2024-09-15 | Discharge: 2024-09-15 | Disposition: A | Discharge status: Home / Self Care | Source: Ambulatory Visit | Attending: Radiology | Admitting: Radiology

## 2024-09-15 ENCOUNTER — Inpatient Hospital Stay

## 2024-09-15 ENCOUNTER — Inpatient Hospital Stay: Attending: Oncology | Admitting: Physician Assistant

## 2024-09-15 VITALS — BP 131/82 | HR 93 | Temp 98.3°F | Resp 20 | Wt 179.4 lb

## 2024-09-15 DIAGNOSIS — C50411 Malignant neoplasm of upper-outer quadrant of right female breast: Secondary | ICD-10-CM | POA: Diagnosis present

## 2024-09-15 DIAGNOSIS — R059 Cough, unspecified: Secondary | ICD-10-CM | POA: Diagnosis not present

## 2024-09-15 DIAGNOSIS — Z809 Family history of malignant neoplasm, unspecified: Secondary | ICD-10-CM | POA: Insufficient documentation

## 2024-09-15 DIAGNOSIS — Z87891 Personal history of nicotine dependence: Secondary | ICD-10-CM | POA: Diagnosis not present

## 2024-09-15 DIAGNOSIS — Z170 Estrogen receptor positive status [ER+]: Secondary | ICD-10-CM | POA: Insufficient documentation

## 2024-09-15 DIAGNOSIS — C7951 Secondary malignant neoplasm of bone: Secondary | ICD-10-CM | POA: Insufficient documentation

## 2024-09-15 DIAGNOSIS — C50911 Malignant neoplasm of unspecified site of right female breast: Secondary | ICD-10-CM

## 2024-09-15 DIAGNOSIS — M797 Fibromyalgia: Secondary | ICD-10-CM | POA: Diagnosis not present

## 2024-09-15 DIAGNOSIS — M810 Age-related osteoporosis without current pathological fracture: Secondary | ICD-10-CM | POA: Insufficient documentation

## 2024-09-15 DIAGNOSIS — E876 Hypokalemia: Secondary | ICD-10-CM | POA: Diagnosis not present

## 2024-09-15 DIAGNOSIS — R590 Localized enlarged lymph nodes: Secondary | ICD-10-CM | POA: Insufficient documentation

## 2024-09-15 DIAGNOSIS — D709 Neutropenia, unspecified: Secondary | ICD-10-CM

## 2024-09-15 DIAGNOSIS — Z79624 Long term (current) use of inhibitors of nucleotide synthesis: Secondary | ICD-10-CM | POA: Diagnosis not present

## 2024-09-15 DIAGNOSIS — R140 Abdominal distension (gaseous): Secondary | ICD-10-CM

## 2024-09-15 DIAGNOSIS — T451X5A Adverse effect of antineoplastic and immunosuppressive drugs, initial encounter: Secondary | ICD-10-CM | POA: Insufficient documentation

## 2024-09-15 DIAGNOSIS — R0609 Other forms of dyspnea: Secondary | ICD-10-CM | POA: Diagnosis not present

## 2024-09-15 DIAGNOSIS — Z1732 Human epidermal growth factor receptor 2 negative status: Secondary | ICD-10-CM | POA: Diagnosis not present

## 2024-09-15 DIAGNOSIS — R000 Tachycardia, unspecified: Secondary | ICD-10-CM | POA: Insufficient documentation

## 2024-09-15 DIAGNOSIS — Z1721 Progesterone receptor positive status: Secondary | ICD-10-CM | POA: Insufficient documentation

## 2024-09-15 DIAGNOSIS — J90 Pleural effusion, not elsewhere classified: Secondary | ICD-10-CM | POA: Insufficient documentation

## 2024-09-15 DIAGNOSIS — Z79810 Long term (current) use of selective estrogen receptor modulators (SERMs): Secondary | ICD-10-CM | POA: Diagnosis not present

## 2024-09-15 DIAGNOSIS — D696 Thrombocytopenia, unspecified: Secondary | ICD-10-CM | POA: Insufficient documentation

## 2024-09-15 DIAGNOSIS — D701 Agranulocytosis secondary to cancer chemotherapy: Secondary | ICD-10-CM | POA: Insufficient documentation

## 2024-09-15 DIAGNOSIS — B379 Candidiasis, unspecified: Secondary | ICD-10-CM | POA: Insufficient documentation

## 2024-09-15 DIAGNOSIS — C773 Secondary and unspecified malignant neoplasm of axilla and upper limb lymph nodes: Secondary | ICD-10-CM | POA: Insufficient documentation

## 2024-09-15 DIAGNOSIS — R188 Other ascites: Secondary | ICD-10-CM | POA: Insufficient documentation

## 2024-09-15 DIAGNOSIS — Z79899 Other long term (current) drug therapy: Secondary | ICD-10-CM | POA: Diagnosis not present

## 2024-09-15 LAB — CBC WITH DIFFERENTIAL (CANCER CENTER ONLY)
Abs Immature Granulocytes: 0.01 K/uL (ref 0.00–0.07)
Basophils Absolute: 0 K/uL (ref 0.0–0.1)
Basophils Relative: 1 %
Eosinophils Absolute: 0.1 K/uL (ref 0.0–0.5)
Eosinophils Relative: 4 %
HCT: 37.3 % (ref 36.0–46.0)
Hemoglobin: 13.1 g/dL (ref 12.0–15.0)
Immature Granulocytes: 0 %
Lymphocytes Relative: 13 %
Lymphs Abs: 0.4 K/uL — ABNORMAL LOW (ref 0.7–4.0)
MCH: 38.1 pg — ABNORMAL HIGH (ref 26.0–34.0)
MCHC: 35.1 g/dL (ref 30.0–36.0)
MCV: 108.4 fL — ABNORMAL HIGH (ref 80.0–100.0)
Monocytes Absolute: 0.5 K/uL (ref 0.1–1.0)
Monocytes Relative: 17 %
Neutro Abs: 1.8 K/uL (ref 1.7–7.7)
Neutrophils Relative %: 65 %
Platelet Count: 104 K/uL — ABNORMAL LOW (ref 150–400)
RBC: 3.44 MIL/uL — ABNORMAL LOW (ref 3.87–5.11)
RDW: 15.4 % (ref 11.5–15.5)
WBC Count: 2.8 K/uL — ABNORMAL LOW (ref 4.0–10.5)
nRBC: 0 % (ref 0.0–0.2)

## 2024-09-15 LAB — CMP (CANCER CENTER ONLY)
ALT: 34 U/L (ref 0–44)
AST: 83 U/L — ABNORMAL HIGH (ref 15–41)
Albumin: 2.8 g/dL — ABNORMAL LOW (ref 3.5–5.0)
Alkaline Phosphatase: 334 U/L — ABNORMAL HIGH (ref 38–126)
Anion gap: 8 (ref 5–15)
BUN: 11 mg/dL (ref 8–23)
CO2: 25 mmol/L (ref 22–32)
Calcium: 8.8 mg/dL — ABNORMAL LOW (ref 8.9–10.3)
Chloride: 106 mmol/L (ref 98–111)
Creatinine: 0.36 mg/dL — ABNORMAL LOW (ref 0.44–1.00)
GFR, Estimated: >60 mL/min (ref >60)
Glucose, Bld: 100 mg/dL — ABNORMAL HIGH (ref 70–99)
Potassium: 3.2 mmol/L — ABNORMAL LOW (ref 3.5–5.1)
Sodium: 139 mmol/L (ref 135–145)
Total Bilirubin: 2.2 mg/dL — ABNORMAL HIGH (ref 0.0–1.2)
Total Protein: 5.9 g/dL — ABNORMAL LOW (ref 6.5–8.1)

## 2024-09-15 LAB — LACTATE DEHYDROGENASE, PLEURAL OR PERITONEAL FLUID: LD, Fluid: 101 U/L — ABNORMAL HIGH (ref 3–23)

## 2024-09-15 LAB — TSH: TSH: 2.35 u[IU]/mL (ref 0.350–4.500)

## 2024-09-15 MED ORDER — SODIUM CHLORIDE 0.9 % IV SOLN: Freq: Once | INTRAVENOUS | Status: AC

## 2024-09-15 MED ORDER — POTASSIUM CHLORIDE 10 MEQ/100ML IV SOLN
10.0000 meq | Freq: Once | INTRAVENOUS | Status: AC
  Administered 2024-09-15: 10 meq via INTRAVENOUS
  Filled 2024-09-15: qty 100

## 2024-09-15 MED ORDER — LIDOCAINE-EPINEPHRINE (PF) 2 %-1:200000 IJ SOLN
INTRAMUSCULAR | Status: AC
  Filled 2024-09-15: qty 20

## 2024-09-15 NOTE — Telephone Encounter (Signed)
 S/w patient regarding myChart message noting new symptoms over the weekend.  Patient agreeable to come in for further evaluation. Patient scheduled to see [facility] and to have lab work drawn. Patient also aware the at she will need to have a CXR done prior to her visit. Instructions given on where to have CXR done.  Patient verbalized an understanding of the information. Dr. Odean aware and in agreement to the plan.

## 2024-09-15 NOTE — Procedures (Signed)
 PROCEDURE SUMMARY:  Successful image-guided right thoracentesis. Yielded 850 mL of hazy yellow fluid. Patient tolerated procedure well. EBL: trace No immediate complications.  Specimen was sent for labs. Post procedure CXR ordered  Please see imaging section of Epic for full dictation.  Kimble DEL Neill Jurewicz PA-C 09/15/2024 3:13 PM

## 2024-09-15 NOTE — Patient Instructions (Signed)
 Hypokalemia Hypokalemia means that the amount of potassium in the blood is lower than normal. Potassium is a mineral (electrolyte) that helps regulate the amount of fluid in the body. It also stimulates muscle tightening (contraction) and helps nerves work properly. Normally, most of the body's potassium is inside cells, and only a very small amount is in the blood. Because the amount in the blood is so small, minor changes to potassium levels in the blood can be life-threatening. What are the causes? This condition may be caused by: Antibiotic medicine. Diarrhea or vomiting. Taking too much of a medicine that helps you have a bowel movement (laxative) can cause diarrhea and lead to hypokalemia. Chronic kidney disease (CKD). Medicines that help the body get rid of excess fluid (diuretics). Eating disorders, such as anorexia or bulimia. Low magnesium levels in the body. Sweating a lot. What are the signs or symptoms? Symptoms of this condition include: Weakness. Constipation. Fatigue. Muscle cramps. Mental confusion. Skipped heartbeats or irregular heartbeat (palpitations). Tingling or numbness. How is this diagnosed? This condition is diagnosed with a blood test. How is this treated? This condition may be treated by: Taking potassium supplements. Adjusting the medicines that you take. Eating more foods that contain a lot of potassium. If your potassium level is very low, you may need to get potassium through an IV and be monitored in the hospital. Follow these instructions at home: Eating and drinking  Eat a healthy diet. A healthy diet includes fresh fruits and vegetables, whole grains, healthy fats, and lean proteins. If told, eat more foods that contain a lot of potassium. These include: Nuts, such as peanuts and pistachios. Seeds, such as sunflower seeds and pumpkin seeds. Peas, lentils, and lima beans. Whole grain and bran cereals and breads. Fresh fruits and vegetables,  such as apricots, avocado, bananas, cantaloupe, kiwi, oranges, tomatoes, asparagus, and potatoes. Juices, such as orange, tomato, and prune. Lean meats, including fish. Milk and milk products, such as yogurt. General instructions Take over-the-counter and prescription medicines only as told by your health care provider. This includes vitamins, natural food products, and supplements. Keep all follow-up visits. This is important. Contact a health care provider if: You have weakness that gets worse. You feel your heart pounding or racing. You vomit. You have diarrhea. You have diabetes and you have trouble keeping your blood sugar in your target range. Get help right away if: You have chest pain. You have shortness of breath. You have vomiting or diarrhea that lasts for more than 2 days. You faint. These symptoms may be an emergency. Get help right away. Call 911. Do not wait to see if the symptoms will go away. Do not drive yourself to the hospital. Summary Hypokalemia means that the amount of potassium in the blood is lower than normal. This condition is diagnosed with a blood test. Hypokalemia may be treated by taking potassium supplements, adjusting the medicines that you take, or eating more foods that are high in potassium. If your potassium level is very low, you may need to get potassium through an IV and be monitored in the hospital. This information is not intended to replace advice given to you by your health care provider. Make sure you discuss any questions you have with your health care provider. Document Revised: 08/04/2021 Document Reviewed: 08/04/2021 Elsevier Patient Education  2024 [employer].

## 2024-09-15 NOTE — Progress Notes (Signed)
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 left lung without dense focal airspace consolidation. Subtle irregular nodular opacity noted left apex similar to previous CT. The cardiopericardial silhouette is within normal limits for size. Right Port-A-Cath again noted. IMPRESSION: 1. Consolidative opacity in the right mid and lower lung with small right pleural effusion. Similar changes are seen on CT chest 07/15/2024. 2. Subtle irregular nodular opacity left apex. Irregular left upper lobe nodules are seen on previous CT. Electronically Signed   By: Camellia Candle M.D.   On: 09/15/2024 10:43        Visit Diagnosis: 1. Pleural effusion   2. Abdominal distension   3. Malignant neoplasm of upper-outer quadrant of right breast in female, estrogen receptor positive (HCC)   4. Hypokalemia   5. Chemotherapy-induced neutropenia   6. Neutropenia, unspecified type   7. Thrombocytopenia      Orders Placed This Encounter  Procedures   IR Paracentesis    Standing Status:   Future    Expiration Date:   09/15/2025    If  therapeutic, is there a maximum amount of fluid to be removed?:   No    Are labs required for specimen collection?:   Yes    Lab orders requested (DO NOT place separate lab orders, these will be automatically ordered during procedure specimen collection)::   Cytology - Non Pap    Is Albumin  medication needed?:   No    Reason for Exam (SYMPTOM  OR DIAGNOSIS REQUIRED):   abdominal distention, previous ascites on CT CAP. breast cancer pt    Preferred Imaging Location?:   [name]   US  THORACENTESIS ASP PLEURAL SPACE W/IMG GUIDE    Breast prognostic panel Culture LDH cytology    Standing Status:   Future    Number of Occurrences:   1    Expected Date:   09/15/2024    Expiration Date:   09/15/2025    Are labs required for specimen  collection?:   Yes    Lab orders requested (DO NOT place separate lab orders, these will be automatically ordered during procedure specimen collection)::   Oncology    Lab orders requested (DO NOT place separate lab orders, these will be automatically ordered during procedure specimen collection)::   Lactate Dehydrogenase-Body Fluid    Oncology specific labs::   Cytology - Non Pap    Reason for Exam (SYMPTOM  OR DIAGNOSIS REQUIRED):   shortness of breath, pleural effusion, breast cancer pt    Preferred imaging location?:   [name]    All questions were answered. The patient knows to call the clinic with any problems, questions or concerns. No barriers to learning was detected.  A total of more than 30 minutes were spent on this encounter with face-to-face time and non-face-to-face time, including preparing to see the patient, ordering tests and/or medications, counseling the patient and coordination of care as outlined above.    Thank you for allowing me to participate in the care of this patient.    Caileb Rhue E  Walisiewicz, PA-C Department of Hematology/Oncology [facility] at [facility] Phone: [phone]   [phone]    09/15/2024 2:55 PM

## 2024-09-18 ENCOUNTER — Other Ambulatory Visit: Payer: Self-pay | Admitting: *Deleted

## 2024-09-18 ENCOUNTER — Ambulatory Visit (HOSPITAL_COMMUNITY)
Admission: RE | Admit: 2024-09-18 | Discharge: 2024-09-18 | Disposition: A | Discharge status: Home / Self Care | Source: Ambulatory Visit | Attending: Radiology | Admitting: Radiology

## 2024-09-18 ENCOUNTER — Ambulatory Visit (HOSPITAL_COMMUNITY)
Admission: RE | Admit: 2024-09-18 | Discharge: 2024-09-18 | Disposition: A | Discharge status: Home / Self Care | Source: Ambulatory Visit | Attending: Physician Assistant | Admitting: Physician Assistant

## 2024-09-18 ENCOUNTER — Other Ambulatory Visit: Payer: Self-pay | Admitting: Physician Assistant

## 2024-09-18 ENCOUNTER — Encounter: Payer: Self-pay | Admitting: Obstetrics and Gynecology

## 2024-09-18 ENCOUNTER — Ambulatory Visit (INDEPENDENT_AMBULATORY_CARE_PROVIDER_SITE_OTHER): Payer: Self-pay | Admitting: Obstetrics and Gynecology

## 2024-09-18 ENCOUNTER — Other Ambulatory Visit (HOSPITAL_COMMUNITY)
Admission: RE | Admit: 2024-09-18 | Discharge: 2024-09-18 | Disposition: A | Discharge status: Home / Self Care | Source: Ambulatory Visit | Attending: Obstetrics and Gynecology | Admitting: Obstetrics and Gynecology

## 2024-09-18 VITALS — BP 122/62 | HR 120 | Temp 98.4°F | Wt 181.0 lb

## 2024-09-18 DIAGNOSIS — N871 Moderate cervical dysplasia: Secondary | ICD-10-CM

## 2024-09-18 DIAGNOSIS — J90 Pleural effusion, not elsewhere classified: Secondary | ICD-10-CM | POA: Insufficient documentation

## 2024-09-18 DIAGNOSIS — R87619 Unspecified abnormal cytological findings in specimens from cervix uteri: Secondary | ICD-10-CM | POA: Diagnosis present

## 2024-09-18 DIAGNOSIS — D696 Thrombocytopenia, unspecified: Secondary | ICD-10-CM

## 2024-09-18 DIAGNOSIS — Z170 Estrogen receptor positive status [ER+]: Secondary | ICD-10-CM

## 2024-09-18 DIAGNOSIS — D709 Neutropenia, unspecified: Secondary | ICD-10-CM

## 2024-09-18 DIAGNOSIS — R140 Abdominal distension (gaseous): Secondary | ICD-10-CM

## 2024-09-18 DIAGNOSIS — T451X5A Adverse effect of antineoplastic and immunosuppressive drugs, initial encounter: Secondary | ICD-10-CM

## 2024-09-18 DIAGNOSIS — E876 Hypokalemia: Secondary | ICD-10-CM

## 2024-09-18 DIAGNOSIS — Z01812 Encounter for preprocedural laboratory examination: Secondary | ICD-10-CM

## 2024-09-18 LAB — PREGNANCY, URINE: Preg Test, Ur: NEGATIVE

## 2024-09-18 MED ORDER — LIDOCAINE-EPINEPHRINE 1 %-1:100000 IJ SOLN
INTRAMUSCULAR | Status: AC
  Filled 2024-09-18: qty 1

## 2024-09-18 MED ORDER — LIDOCAINE-EPINEPHRINE 1 %-1:100000 IJ SOLN
20.0000 mL | Freq: Once | INTRAMUSCULAR | Status: AC
  Administered 2024-09-18: 10 mL via INTRADERMAL

## 2024-09-18 NOTE — Progress Notes (Signed)
 Received call from pt with complaint of ongoing abdominal distention and shortness of breath.  Pt states she underwent a thoracentesis today and 800 mL were removed.  Per MD pt needing STAT CT CAP for further evaluation of metastatic disease.  Orders placed, appt will be scheduled once PA is obtained.

## 2024-09-18 NOTE — Progress Notes (Signed)
 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   Estimated blood loss: Minimal <15cc   Specimens:  Cervical LEEP pinned at 12:00 Endocervical LEEP Endocervical curettage   Disposition of specimen: Pathology    PROCEDURE IN DETAIL:   Patient was consented for the procedure and timeout was performed. She was placed in the dorsal lithotomy position. A grounding pad placed on the patient. A bivalved coated speculum was placed in the patient's vagina. Colposcopy was performed with the above-noted findings.  The cervix was prepped. Local anesthesia was administered via an intracervical block using 20 ml of 1% Lidocaine  with epinephrine . The suction was turned on, and the 15x33mm loop wire on 50 Watts of blended current was used to excise the transformation zone. An ECC was performed. Specimens were handed off and sent to pathology.   Roller ball electrocautery was performed around the margins and along the surgical bed. Excellent hemostasis was achieved using Monsel's solution. The speculum was removed from the vagina.   Sponge, laps, instrument count were correct x2.  Patient tolerated the procedure well.  Routine postoperative instructions given.    Vera LULLA Pa, MD Obstetrician & [facility] [place], [facility]

## 2024-09-19 ENCOUNTER — Ambulatory Visit (HOSPITAL_BASED_OUTPATIENT_CLINIC_OR_DEPARTMENT_OTHER)
Admission: RE | Admit: 2024-09-19 | Discharge: 2024-09-19 | Disposition: A | Source: Ambulatory Visit | Attending: Hematology and Oncology

## 2024-09-19 DIAGNOSIS — Z170 Estrogen receptor positive status [ER+]: Secondary | ICD-10-CM | POA: Diagnosis present

## 2024-09-19 DIAGNOSIS — C50411 Malignant neoplasm of upper-outer quadrant of right female breast: Secondary | ICD-10-CM | POA: Diagnosis present

## 2024-09-19 DIAGNOSIS — R140 Abdominal distension (gaseous): Secondary | ICD-10-CM | POA: Diagnosis present

## 2024-09-19 LAB — CYTOLOGY - NON PAP

## 2024-09-19 MED ORDER — IOHEXOL 300 MG/ML  SOLN
100.0000 mL | Freq: Once | INTRAMUSCULAR | Status: AC | PRN
  Administered 2024-09-19: 100 mL via INTRAVENOUS

## 2024-09-22 ENCOUNTER — Encounter: Payer: Self-pay | Admitting: Hematology and Oncology

## 2024-09-23 ENCOUNTER — Inpatient Hospital Stay (HOSPITAL_BASED_OUTPATIENT_CLINIC_OR_DEPARTMENT_OTHER): Admitting: Hematology and Oncology

## 2024-09-23 ENCOUNTER — Inpatient Hospital Stay

## 2024-09-23 VITALS — BP 117/70 | HR 106 | Temp 98.2°F | Resp 18 | Ht 63.0 in | Wt 184.2 lb

## 2024-09-23 DIAGNOSIS — C50411 Malignant neoplasm of upper-outer quadrant of right female breast: Secondary | ICD-10-CM

## 2024-09-23 DIAGNOSIS — Z170 Estrogen receptor positive status [ER+]: Secondary | ICD-10-CM

## 2024-09-23 DIAGNOSIS — C50911 Malignant neoplasm of unspecified site of right female breast: Secondary | ICD-10-CM

## 2024-09-23 LAB — CMP (CANCER CENTER ONLY)
ALT: 40 U/L (ref 0–44)
AST: 95 U/L — ABNORMAL HIGH (ref 15–41)
Albumin: 2.6 g/dL — ABNORMAL LOW (ref 3.5–5.0)
Alkaline Phosphatase: 423 U/L — ABNORMAL HIGH (ref 38–126)
Anion gap: 9 (ref 5–15)
BUN: 8 mg/dL (ref 8–23)
CO2: 23 mmol/L (ref 22–32)
Calcium: 8.5 mg/dL — ABNORMAL LOW (ref 8.9–10.3)
Chloride: 105 mmol/L (ref 98–111)
Creatinine: 0.33 mg/dL — ABNORMAL LOW (ref 0.44–1.00)
GFR, Estimated: >60 mL/min (ref >60)
Glucose, Bld: 78 mg/dL (ref 70–99)
Potassium: 3.3 mmol/L — ABNORMAL LOW (ref 3.5–5.1)
Sodium: 137 mmol/L (ref 135–145)
Total Bilirubin: 3.4 mg/dL — ABNORMAL HIGH (ref 0.0–1.2)
Total Protein: 5.7 g/dL — ABNORMAL LOW (ref 6.5–8.1)

## 2024-09-23 LAB — CBC WITH DIFFERENTIAL (CANCER CENTER ONLY)
Abs Immature Granulocytes: 0.02 K/uL (ref 0.00–0.07)
Basophils Absolute: 0 K/uL (ref 0.0–0.1)
Basophils Relative: 1 %
Eosinophils Absolute: 0.1 K/uL (ref 0.0–0.5)
Eosinophils Relative: 4 %
HCT: 35.4 % — ABNORMAL LOW (ref 36.0–46.0)
Hemoglobin: 12.6 g/dL (ref 12.0–15.0)
Immature Granulocytes: 1 %
Lymphocytes Relative: 10 %
Lymphs Abs: 0.4 K/uL — ABNORMAL LOW (ref 0.7–4.0)
MCH: 37.5 pg — ABNORMAL HIGH (ref 26.0–34.0)
MCHC: 35.6 g/dL (ref 30.0–36.0)
MCV: 105.4 fL — ABNORMAL HIGH (ref 80.0–100.0)
Monocytes Absolute: 0.7 K/uL (ref 0.1–1.0)
Monocytes Relative: 18 %
Neutro Abs: 2.6 K/uL (ref 1.7–7.7)
Neutrophils Relative %: 66 %
Platelet Count: 122 K/uL — ABNORMAL LOW (ref 150–400)
RBC: 3.36 MIL/uL — ABNORMAL LOW (ref 3.87–5.11)
RDW: 15.9 % — ABNORMAL HIGH (ref 11.5–15.5)
WBC Count: 3.8 K/uL — ABNORMAL LOW (ref 4.0–10.5)
nRBC: 0 % (ref 0.0–0.2)

## 2024-09-23 LAB — TSH: TSH: 3.52 u[IU]/mL (ref 0.350–4.500)

## 2024-09-23 NOTE — Assessment & Plan Note (Signed)
 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 Chest wall nodules: 02/04/2024: Caris testing identified PTEN deletion suggesting benefit to Capivasertib with fulvestrant .  (ER +95%, PR +95%, HER2 negative null score 0, MSI: Stable, TMB 5, genomic LOH: High   CT CAP: Liver mets 2.9 cm (was 1.7 cm), 2.5 cm (was 2.7 cm) others stable, enl sub pleural nodule 0.8 cm, increase in Left supraclav LN and pe vascular LN), stable bone mets ---------------------------------------------------------------------------------------------------------------------------------------- CT CAP 07/21/2024: New skin thickening left breast and newly enlarged left axillary and subpectoral lymph nodes, slight interval enlargement of liver lesions moderate right pleural effusion and interval enlargement of right pleural nodules, groundglass opacities unchanged bone metastases CT CAP 09/19/2024: Interval progression of pulmonary and hepatic metastasis (right lung nodule increased from 2 cm to 4 cm, liver met increased from 4 cm to 5.4 cm)   Guardant360 07/23/2024: T p53 mutations (no FDA approved therapies) TMB 17.0 8 mut/Mb   Patient is not requiring recurrent thoracentesis  Recommendation: I discussed with the patient that additional systemic chemotherapies may not yield much benefit and I recommended we consider hospice care.

## 2024-09-23 NOTE — Progress Notes (Signed)
 DISCONTINUE ON PATHWAY REGIMEN - Breast     A cycle is every 21 days:     Pembrolizumab    **Always confirm dose/schedule in your pharmacy ordering system**  PRIOR TREATMENT: [id number]: Pembrolizumab  200 mg q21 Days Until Progression, Unacceptable Toxicity, or up to 24 Months  START OFF PATHWAY REGIMEN - Breast   [id number]:Nab-Paclitaxel  260 mg/m2 IV D1 q21 Days:   A cycle is every 21 days:     Nab-paclitaxel  (protein bound)   **Always confirm dose/schedule in your pharmacy ordering system**  Patient Characteristics: Distant Metastases or Locoregional Recurrent Disease - Unresectable, M0 or Locally Advanced Unresectable Disease Progressing after Neoadjuvant and Local Therapies, M0, HER2 Negative/Ultralow/Low, ER Positive, Molecular Alterations (BRAFm, MSI-H/dMMR,  TMB-H, or NTRK/RET Gene Fusion), MSI-H/dMMR or TMB-H Therapeutic Status: Distant Metastases HER2 Status: Low ER Status: Positive (+) PR Status: Positive (+) Therapy Approach Indicated: Standard Therapy Exhausted and Molecular Alterations Present (BRAFm, MSI-H/dMMR, TMB-H, or NTRK/RET Gene Fusion) Microsatellite/Mismatch Repair Status: MSS/pMMR NTRK Gene Fusion Status: Did Not Order Test Other Mutations/Biomarkers: No Other Actionable Mutations Tumor Mutational Burden (TMB): TMB-H RET Gene Fusion Status: Negative BRAF [id number] Mutation Status: Did Not Order Test Intent of Therapy: Non-Curative / Palliative Intent, Discussed with Patient

## 2024-09-23 NOTE — Progress Notes (Signed)
 Patient Care Team: Billy Philippe SAUNDERS, NP as PCP - General (Family Medicine) Zenaida Morene PARAS, MD as PCP - Cardiology (Cardiology) Izell Domino, MD as Attending Physician (Radiation Oncology) Crawford, Morna Pickle, NP as Nurse Practitioner (Hematology and Oncology) Signe Mitzie LABOR, MD as Consulting Physician (General Surgery) Janit Thresa HERO, DPM as Consulting Physician (Podiatry) Lennard Lesta FALCON, MD as Consulting Physician (Gastroenterology) Odean Potts, MD as Attending Physician (Hematology and Oncology)  DIAGNOSIS:  Encounter Diagnosis  Name Primary?   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)   06/21/2022 - 08/03/2022 Chemotherapy   Patient is on Treatment Plan : BREAST METASTATIC fam-trastuzumab deruxtecan-nxki  (Enhertu ) q21d     06/21/2022 - 04/19/2023 Chemotherapy   Patient is on Treatment Plan : BREAST METASTATIC Fam-Trastuzumab Deruxtecan-nxki  (Enhertu ) (5.4) q21d     05/18/2023 - 07/06/2023 Chemotherapy   Patient is on Treatment Plan : BREAST METASTATIC Sacituzumab govitecan -hziy (Trodelvy ) D1,8 q21d     10/16/2023 - 01/05/2024 Chemotherapy   Patient is on Treatment Plan : BREAST METASTATIC Eribulin  D1,8 q21d     01/21/2024 - 03/17/2024 Chemotherapy   Patient is on Treatment Plan : BREAST Liposomal Doxorubicin  (50) q28d     04/15/2024 - 07/07/2024 Chemotherapy   Patient is on Treatment Plan : BREAST Datopotamab Deruxtecan-dlnk  (Datroway ) q21d     Malignant neoplasm of upper-outer quadrant of right breast in female, estrogen receptor positive (HCC)  06/21/2016 Initial Diagnosis   Malignant neoplasm of upper-outer quadrant of right breast in female, estrogen receptor positive (HCC)   06/21/2016 Initial Biopsy    Right breast biopsy, 10 oclock: IDC, grade 3, ER+(95%), PR+(80%),Ki67 20%, HER-2 negative (ratio 0.67). Right axilla core biopsy: carcinoma, grade 3, ER+(100%), PR+(90%), Ki67 25%, HER-2 negative (ratio 1.13).    07/17/2016 - 11/06/2016 Neo-Adjuvant Chemotherapy   Received 2 cycles of Doxorubicin  and Cyclophosphamide , then transitioned to weekly Paclitaxel  (due to repeated febrile neutropenia) x 7 cycles, stopped early due to peripheral neuropathy, then completed her final 2 cycles of Doxorubicin  and Cyclophosphamide .    12/19/2016 Surgery   Right breast lumpectomy ([facility]): IDC, grade 2, 1.6cm and 0.3cm, margins negative, 3 SLN positive for metastatic carcinoma.     12/26/2016 Surgery   ALND: metastatic carcinoma in one of 20 lymph nodes, and three nodes from previous lumpectomy.  Four positive nodes, consistent with pN2a.   02/07/2017 - 03/21/2017 Radiation Therapy   Adjuvant radiation Audry): 1) Right breast and nodes - 4 field: 50 Gy in 25 fractions. IM NODES: >95% receive at least 45Gy/47fx. 50Gy to SCLV/PAB @ 2Gy /fraction x 25 fractions. 2) Right breast boost: 10 Gy in 5 fractions   04/2017 -  Anti-estrogen oral therapy   Anastrozole  1mg  daily.  Bone density 03/23/2017 finds T score of -2.6, osteoporosis, plan to start Prolia  following dental clearance Anastrozole  stopped 01/16/18 Exemestane  25 mg daily 02/12/18  On [name], trial randomized to endocrine therapy alone   05/27/2020 Progression   chest CT scan 05/27/2020 shows bulky mediastinal and right hilar lymphadenopathy with right pleural nodules and a small right pleural effusion, no evidence of liver or bone involvement             (a) biopsy of right breast mass 06/02/2020 shows invasive ductal carcinoma, estrogen and progesterone receptor positive, HER-2 not amplified,  with an MIB-1 of 40%   06/17/2020 Treatment Plan Change    fulvestrant  to start 06/17/2020             (a) palbociclib  to start 06/17/2020 at 125 mg daily 21 days on 7  days off             (b) palbociclib  decreased to 125mg  every other day x 11 doses on 07/23/2020             (c) palbociclib  dose decreased to100 mg daily, 21/7, starting with September cycle             (d) Palbociclib  decreased to 75mg  daily beginning with March cycle due to oral ulcers             (e) palbociclib  discontinued November 2022 with progression   09/14/2021 PET scan   PET scan 09/14/2021 shows progression in liver and bone   10/05/2021 Pathology Results   liver biopsy 10/05/2021 confirms metastatic carcinoma, estrogen receptor strongly positive, progesterone receptor and HER2 negative, with an MIB-1 of 30%.   10/2021 Treatment Plan Change   fulvestrant  continued, Xgeva  added, palbociclib  changed to abemaciclib  November 2022 -Dose Decreased to 50mg  PO BID Daily   03/07/2022 Miscellaneous   Caris molecular testing: ER positive, ER positive, MTAP: Detected, ESR 1 negative, BRCA 1 and 2 negative, MSI stable, PD-L1 negative, PIK 3 CA negative, PR negative,   06/21/2022 - 08/03/2022 Chemotherapy   Patient is on Treatment Plan : BREAST METASTATIC fam-trastuzumab deruxtecan-nxki  (Enhertu ) q21d     06/21/2022 - 04/19/2023 Chemotherapy   Patient is on Treatment Plan : BREAST METASTATIC Fam-Trastuzumab Deruxtecan-nxki  (Enhertu ) (5.4) q21d     01/25/2023 Cancer Staging   Staging form: Breast, AJCC 7th Edition - Pathologic: Stage IV (M1) - Signed by Crawford Morna Pickle, NP on 01/25/2023   05/18/2023 - 07/06/2023 Chemotherapy   Patient is on Treatment Plan : BREAST METASTATIC Sacituzumab govitecan -hziy (Trodelvy ) D1,8 q21d     07/23/2023 -  Chemotherapy   Xeloda  1000 mg bid 14 days on and 7 days off   08/27/2023 Procedure   Y90 hepatic radio embolization   10/16/2023 - 01/05/2024 Chemotherapy   Patient is on Treatment Plan : BREAST METASTATIC Eribulin  D1,8 q21d     01/21/2024 - 03/17/2024 Chemotherapy   Patient is on Treatment Plan : BREAST Liposomal Doxorubicin  (50) q28d     04/15/2024  - 07/07/2024 Chemotherapy   Patient is on Treatment Plan : BREAST Datopotamab Deruxtecan-dlnk  (Datroway ) q21d     08/12/2024 -  Chemotherapy   Patient is on Treatment Plan : BREAST Pembrolizumab  (200) q21d x 24 months     Breast cancer, stage 4 (HCC)  03/09/2022 Initial Diagnosis   Breast cancer, stage 4 (HCC)   06/21/2022 - 08/03/2022 Chemotherapy   Patient is on Treatment Plan : BREAST METASTATIC fam-trastuzumab deruxtecan-nxki  (Enhertu ) q21d     06/21/2022 - 04/19/2023 Chemotherapy   Patient is on Treatment Plan : BREAST METASTATIC Fam-Trastuzumab Deruxtecan-nxki  (Enhertu ) (5.4) q21d     05/18/2023 - 07/06/2023 Chemotherapy   Patient is on Treatment Plan : BREAST METASTATIC Sacituzumab govitecan -hziy (Trodelvy ) D1,8 q21d     10/16/2023 - 01/05/2024 Chemotherapy   Patient is on Treatment Plan : BREAST METASTATIC Eribulin  D1,8 q21d     01/21/2024 - 03/17/2024 Chemotherapy   Patient is on Treatment Plan : BREAST Liposomal Doxorubicin  (50) q28d     04/15/2024 - 07/07/2024 Chemotherapy   Patient is on Treatment Plan : BREAST Datopotamab Deruxtecan-dlnk  (Datroway ) q21d  CHIEF COMPLIANT: Follow-up to discuss results of scans and treatment plan  HISTORY OF PRESENT ILLNESS:   History of Present Illness April [name] is a 63 year old female with metastatic cancer who presents with worsening symptoms and consideration of treatment options.  She has undergone thoracentesis twice for pleural effusion, with no significant abdominal fluid accumulation noted. She experiences significant dyspnea and worsening pain in her middle and lower back.  She has bleeding gums, particularly in the morning, with an associated unpleasant taste in her mouth. Her oral care includes mouthwash and saltwater rinses. She is not taking aspirin or similar medications. Recent blood work shows a white blood cell count of 3.8, hemoglobin of 12.6, and platelets above 100.  She lives alone with seven dogs. Her daughter lives  next door but is often unavailable due to her own family commitments. She feels her daughter may not fully understand the severity of her condition.     ALLERGIES:  is allergic to datopotamab deruxtecan, eribulin  mesylate, codeine , cymbalta  [duloxetine  hcl], hydrocodone , ultram  [tramadol  hcl], and venlafaxine .  MEDICATIONS:  Current Outpatient Medications  Medication Sig Dispense Refill   buPROPion  (WELLBUTRIN  XL) 150 MG 24 hr tablet Take 1 tablet (150 mg total) by mouth daily. 90 tablet 1   cromolyn (OPTICROM) 4 % ophthalmic solution SMARTSIG:In Eye(s)     cyclobenzaprine  (FLEXERIL ) 5 MG tablet Take 1 tablet (5 mg total) by mouth 3 (three) times daily as needed for muscle spasms. 30 tablet 0   Denosumab  (XGEVA  Dilkon) Inject 120 mg into the skin every 3 (three) months.     KLOR-CON  M10 10 MEQ tablet TAKE 1 TABLET BY MOUTH EVERY DAY 90 tablet 1   loratadine  (CLARITIN ) 10 MG tablet Take 10 mg by mouth daily.     magic mouthwash (nystatin , lidocaine , diphenhydrAMINE , alum & mag hydroxide) suspension Take 5 mLs by mouth 4 (four) times daily as needed for mouth pain. Suspension contains equal amounts of Maalox Extra Strength, nystatin , diphenhydramine  and lidocaine . 240 mL 2   Morphine  Sulfate (MORPHINE  CONCENTRATE) 10 mg / 0.5 ml concentrated solution Take 0.5 mLs (10 mg total) by mouth every 2 (two) hours as needed. 120 mL 0   naproxen sodium (ALEVE) 220 MG tablet Take 440-660 mg by mouth 2 (two) times daily as needed (pain).     omeprazole  (PRILOSEC) 20 MG capsule Take 20 mg by mouth daily.     potassium chloride  (KLOR-CON ) 10 MEQ tablet Take 1 tablet (10 mEq total) by mouth 2 (two) times daily. 60 tablet 0   rizatriptan  (MAXALT ) 5 MG tablet Take 1 tablet (5 mg total) by mouth as needed for migraine. May repeat in 2 hours if needed 10 tablet 0   valACYclovir  (VALTREX ) 1000 MG tablet Take 1 tablet (1,000 mg total) by mouth 2 (two) times daily. 180 tablet 4   XIIDRA 5 % SOLN      Current  Facility-Administered Medications  Medication Dose Route Frequency Provider Last Rate Last Admin   lidocaine  (PF) (XYLOCAINE ) 1 % injection 10 mL  10 mL Infiltration Once         PHYSICAL EXAMINATION: ECOG PERFORMANCE STATUS: 1 - Symptomatic but completely ambulatory  Vitals:   09/23/24 1046  BP: 117/70  Pulse: (!) 106  Resp: 18  Temp: 98.2 F (36.8 C)  SpO2: 97%   Filed Weights   09/23/24 1046  Weight: 184 lb 3.2 oz (83.6 kg)    Physical Exam   (exam performed in the presence of a  chaperone)  LABORATORY DATA:  I have reviewed the data as listed    Latest Ref Rng & Units 09/15/2024   10:31 AM 09/02/2024    9:57 AM 08/11/2024    3:09 PM  CMP  Glucose 70 - 99 mg/dL 899  884  74   BUN 8 - 23 mg/dL 11  11  9    Creatinine 0.44 - 1.00 mg/dL 9.63  9.54  9.64   Sodium 135 - 145 mmol/L 139  137  139   Potassium 3.5 - 5.1 mmol/L 3.2  3.0  3.6   Chloride 98 - 111 mmol/L 106  105  108   CO2 22 - 32 mmol/L 25  25  25    Calcium 8.9 - 10.3 mg/dL 8.8  8.2  8.7   Total Protein 6.5 - 8.1 g/dL 5.9  5.8  5.6   Total Bilirubin 0.0 - 1.2 mg/dL 2.2  1.2  1.2   Alkaline Phos 38 - 126 U/L 334  376  348   AST 15 - 41 U/L 83  67  54   ALT 0 - 44 U/L 34  28  23     Lab Results  Component Value Date   WBC 2.8 (L) 09/15/2024   HGB 13.1 09/15/2024   HCT 37.3 09/15/2024   MCV 108.4 (H) 09/15/2024   PLT 104 (L) 09/15/2024   NEUTROABS 1.8 09/15/2024    ASSESSMENT & PLAN:  Malignant neoplasm of upper-outer quadrant of right breast in female, 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 Chest wall nodules: 02/04/2024: Caris testing identified PTEN deletion suggesting benefit to Capivasertib with fulvestrant .  (ER +95%, PR +95%, HER2  negative null score 0, MSI: Stable, TMB 5, genomic LOH: High   CT CAP: Liver mets 2.9 cm (was 1.7 cm), 2.5 cm (was 2.7 cm) others stable, enl sub pleural nodule 0.8 cm, increase in Left supraclav LN and pe vascular LN), stable bone mets ---------------------------------------------------------------------------------------------------------------------------------------- CT CAP 07/21/2024: New skin thickening left breast and newly enlarged left axillary and subpectoral lymph nodes, slight interval enlargement of liver lesions moderate right pleural effusion and interval enlargement of right pleural nodules, groundglass opacities unchanged bone metastases CT CAP 09/19/2024: Interval progression of pulmonary and hepatic metastasis (right lung nodule increased from 2 cm to 4 cm, liver met increased from 4 cm to 5.4 cm)   Guardant360 07/23/2024: T p53 mutations (no FDA approved therapies) TMB 17.0 8 mut/Mb   Patient is requiring recurrent thoracentesis  Recommendation: I discussed with the patient that additional systemic chemotherapies may not yield much benefit and I discussed the role of hospice care.  She wants to try at least another round of chemo before going that route.  Plan: Discontinue pembrolizumab  Start Abraxane  in 1 week Abraxane  counseling: I discussed with her risks and benefits of BX including hair loss, neuropathy, cytopenias and she is agreeable to proceed.    Return to clinic in 1 week to start Abraxane   No orders of the defined types were placed in this encounter.  The patient has a good understanding of the overall plan. she agrees with it. she will call with any problems that may develop before the next visit here.  I personally spent a total of 45 minutes in the care of the patient today including preparing to see the patient,  getting/reviewing separately obtained history, performing a medically appropriate exam/evaluation, counseling and educating, placing orders, referring  and communicating with other health care professionals, documenting clinical information in the EHR, independently interpreting results, communicating results, and coordinating care.   Viinay K Kelita Wallis, MD 09/23/24

## 2024-09-24 ENCOUNTER — Emergency Department (HOSPITAL_COMMUNITY)

## 2024-09-24 ENCOUNTER — Other Ambulatory Visit: Payer: Self-pay

## 2024-09-24 ENCOUNTER — Encounter: Payer: Self-pay | Admitting: Hematology and Oncology

## 2024-09-24 ENCOUNTER — Inpatient Hospital Stay (HOSPITAL_COMMUNITY)
Admission: EM | Admit: 2024-09-24 | Discharge: 2024-09-26 | DRG: 194 | Disposition: A | Discharge status: Hospice | Attending: Internal Medicine | Admitting: Internal Medicine

## 2024-09-24 ENCOUNTER — Encounter (HOSPITAL_COMMUNITY): Payer: Self-pay | Admitting: Emergency Medicine

## 2024-09-24 DIAGNOSIS — R053 Chronic cough: Secondary | ICD-10-CM | POA: Diagnosis not present

## 2024-09-24 DIAGNOSIS — Z9221 Personal history of antineoplastic chemotherapy: Secondary | ICD-10-CM

## 2024-09-24 DIAGNOSIS — C7801 Secondary malignant neoplasm of right lung: Secondary | ICD-10-CM | POA: Diagnosis present

## 2024-09-24 DIAGNOSIS — R1011 Right upper quadrant pain: Secondary | ICD-10-CM | POA: Diagnosis not present

## 2024-09-24 DIAGNOSIS — C787 Secondary malignant neoplasm of liver and intrahepatic bile duct: Secondary | ICD-10-CM | POA: Diagnosis present

## 2024-09-24 DIAGNOSIS — C50411 Malignant neoplasm of upper-outer quadrant of right female breast: Secondary | ICD-10-CM | POA: Diagnosis present

## 2024-09-24 DIAGNOSIS — Z818 Family history of other mental and behavioral disorders: Secondary | ICD-10-CM

## 2024-09-24 DIAGNOSIS — Z515 Encounter for palliative care: Secondary | ICD-10-CM

## 2024-09-24 DIAGNOSIS — Z87891 Personal history of nicotine dependence: Secondary | ICD-10-CM | POA: Diagnosis not present

## 2024-09-24 DIAGNOSIS — E669 Obesity, unspecified: Secondary | ICD-10-CM | POA: Diagnosis present

## 2024-09-24 DIAGNOSIS — Z59868 Other specified financial insecurity: Secondary | ICD-10-CM

## 2024-09-24 DIAGNOSIS — M797 Fibromyalgia: Secondary | ICD-10-CM | POA: Diagnosis present

## 2024-09-24 DIAGNOSIS — E872 Acidosis, unspecified: Secondary | ICD-10-CM | POA: Diagnosis present

## 2024-09-24 DIAGNOSIS — Z170 Estrogen receptor positive status [ER+]: Secondary | ICD-10-CM | POA: Diagnosis not present

## 2024-09-24 DIAGNOSIS — J90 Pleural effusion, not elsewhere classified: Secondary | ICD-10-CM | POA: Diagnosis present

## 2024-09-24 DIAGNOSIS — Z7189 Other specified counseling: Secondary | ICD-10-CM

## 2024-09-24 DIAGNOSIS — M170 Bilateral primary osteoarthritis of knee: Secondary | ICD-10-CM | POA: Diagnosis present

## 2024-09-24 DIAGNOSIS — N393 Stress incontinence (female) (male): Secondary | ICD-10-CM | POA: Diagnosis present

## 2024-09-24 DIAGNOSIS — Z66 Do not resuscitate: Secondary | ICD-10-CM | POA: Diagnosis present

## 2024-09-24 DIAGNOSIS — Z923 Personal history of irradiation: Secondary | ICD-10-CM

## 2024-09-24 DIAGNOSIS — F419 Anxiety disorder, unspecified: Secondary | ICD-10-CM | POA: Diagnosis present

## 2024-09-24 DIAGNOSIS — Z5948 Other specified lack of adequate food: Secondary | ICD-10-CM

## 2024-09-24 DIAGNOSIS — Z885 Allergy status to narcotic agent status: Secondary | ICD-10-CM

## 2024-09-24 DIAGNOSIS — Z8249 Family history of ischemic heart disease and other diseases of the circulatory system: Secondary | ICD-10-CM

## 2024-09-24 DIAGNOSIS — Z5941 Food insecurity: Secondary | ICD-10-CM

## 2024-09-24 DIAGNOSIS — M19012 Primary osteoarthritis, left shoulder: Secondary | ICD-10-CM | POA: Diagnosis present

## 2024-09-24 DIAGNOSIS — R188 Other ascites: Secondary | ICD-10-CM | POA: Diagnosis present

## 2024-09-24 DIAGNOSIS — G893 Neoplasm related pain (acute) (chronic): Secondary | ICD-10-CM | POA: Diagnosis present

## 2024-09-24 DIAGNOSIS — R609 Edema, unspecified: Secondary | ICD-10-CM | POA: Diagnosis present

## 2024-09-24 DIAGNOSIS — S21001D Unspecified open wound of right breast, subsequent encounter: Secondary | ICD-10-CM | POA: Diagnosis not present

## 2024-09-24 DIAGNOSIS — R000 Tachycardia, unspecified: Secondary | ICD-10-CM | POA: Diagnosis present

## 2024-09-24 DIAGNOSIS — J189 Pneumonia, unspecified organism: Secondary | ICD-10-CM | POA: Diagnosis present

## 2024-09-24 DIAGNOSIS — Z8619 Personal history of other infectious and parasitic diseases: Secondary | ICD-10-CM

## 2024-09-24 DIAGNOSIS — Z91410 Personal history of adult physical and sexual abuse: Secondary | ICD-10-CM

## 2024-09-24 DIAGNOSIS — C50819 Malignant neoplasm of overlapping sites of unspecified female breast: Secondary | ICD-10-CM

## 2024-09-24 DIAGNOSIS — Z79899 Other long term (current) drug therapy: Secondary | ICD-10-CM | POA: Diagnosis not present

## 2024-09-24 DIAGNOSIS — K219 Gastro-esophageal reflux disease without esophagitis: Secondary | ICD-10-CM | POA: Diagnosis present

## 2024-09-24 DIAGNOSIS — F32A Depression, unspecified: Secondary | ICD-10-CM | POA: Diagnosis present

## 2024-09-24 DIAGNOSIS — C7951 Secondary malignant neoplasm of bone: Secondary | ICD-10-CM | POA: Diagnosis present

## 2024-09-24 DIAGNOSIS — M19011 Primary osteoarthritis, right shoulder: Secondary | ICD-10-CM | POA: Diagnosis present

## 2024-09-24 DIAGNOSIS — E7800 Pure hypercholesterolemia, unspecified: Secondary | ICD-10-CM | POA: Diagnosis present

## 2024-09-24 DIAGNOSIS — Z83438 Family history of other disorder of lipoprotein metabolism and other lipidemia: Secondary | ICD-10-CM

## 2024-09-24 DIAGNOSIS — M419 Scoliosis, unspecified: Secondary | ICD-10-CM | POA: Diagnosis present

## 2024-09-24 DIAGNOSIS — R140 Abdominal distension (gaseous): Secondary | ICD-10-CM | POA: Diagnosis not present

## 2024-09-24 DIAGNOSIS — R531 Weakness: Secondary | ICD-10-CM | POA: Diagnosis not present

## 2024-09-24 DIAGNOSIS — Z888 Allergy status to other drugs, medicaments and biological substances status: Secondary | ICD-10-CM

## 2024-09-24 DIAGNOSIS — E876 Hypokalemia: Secondary | ICD-10-CM | POA: Diagnosis not present

## 2024-09-24 DIAGNOSIS — Z8261 Family history of arthritis: Secondary | ICD-10-CM

## 2024-09-24 DIAGNOSIS — M810 Age-related osteoporosis without current pathological fracture: Secondary | ICD-10-CM | POA: Diagnosis present

## 2024-09-24 DIAGNOSIS — R0602 Shortness of breath: Secondary | ICD-10-CM | POA: Diagnosis present

## 2024-09-24 DIAGNOSIS — Z560 Unemployment, unspecified: Secondary | ICD-10-CM

## 2024-09-24 LAB — URINALYSIS, W/ REFLEX TO CULTURE (INFECTION SUSPECTED)
Bilirubin Urine: NEGATIVE
Glucose, UA: NEGATIVE mg/dL
Ketones, ur: NEGATIVE mg/dL
Nitrite: NEGATIVE
Protein, ur: NEGATIVE mg/dL
Specific Gravity, Urine: 1.015 (ref 1.005–1.030)
WBC, UA: >50 WBC/hpf (ref 0–5)
pH: 5 (ref 5.0–8.0)

## 2024-09-24 LAB — CBC WITH DIFFERENTIAL/PLATELET
Abs Immature Granulocytes: 0.02 K/uL (ref 0.00–0.07)
Basophils Absolute: 0 K/uL (ref 0.0–0.1)
Basophils Relative: 1 %
Eosinophils Absolute: 0.1 K/uL (ref 0.0–0.5)
Eosinophils Relative: 3 %
HCT: 38.8 % (ref 36.0–46.0)
Hemoglobin: 13.4 g/dL (ref 12.0–15.0)
Immature Granulocytes: 1 %
Lymphocytes Relative: 9 %
Lymphs Abs: 0.4 K/uL — ABNORMAL LOW (ref 0.7–4.0)
MCH: 37.9 pg — ABNORMAL HIGH (ref 26.0–34.0)
MCHC: 34.5 g/dL (ref 30.0–36.0)
MCV: 109.6 fL — ABNORMAL HIGH (ref 80.0–100.0)
Monocytes Absolute: 1 K/uL (ref 0.1–1.0)
Monocytes Relative: 22 %
Neutro Abs: 2.8 K/uL (ref 1.7–7.7)
Neutrophils Relative %: 64 %
Platelets: 161 K/uL (ref 150–400)
RBC: 3.54 MIL/uL — ABNORMAL LOW (ref 3.87–5.11)
RDW: 16.2 % — ABNORMAL HIGH (ref 11.5–15.5)
WBC: 4.4 K/uL (ref 4.0–10.5)
nRBC: 0 % (ref 0.0–0.2)

## 2024-09-24 LAB — COMPREHENSIVE METABOLIC PANEL WITH GFR
ALT: 53 U/L — ABNORMAL HIGH (ref 0–44)
AST: 133 U/L — ABNORMAL HIGH (ref 15–41)
Albumin: 2.9 g/dL — ABNORMAL LOW (ref 3.5–5.0)
Alkaline Phosphatase: 571 U/L — ABNORMAL HIGH (ref 38–126)
Anion gap: 12 (ref 5–15)
BUN: 9 mg/dL (ref 8–23)
CO2: 22 mmol/L (ref 22–32)
Calcium: 8.8 mg/dL — ABNORMAL LOW (ref 8.9–10.3)
Chloride: 102 mmol/L (ref 98–111)
Creatinine, Ser: 0.46 mg/dL (ref 0.44–1.00)
GFR, Estimated: >60 mL/min (ref >60)
Glucose, Bld: 89 mg/dL (ref 70–99)
Potassium: 3.3 mmol/L — ABNORMAL LOW (ref 3.5–5.1)
Sodium: 136 mmol/L (ref 135–145)
Total Bilirubin: 3.4 mg/dL — ABNORMAL HIGH (ref 0.0–1.2)
Total Protein: 6.2 g/dL — ABNORMAL LOW (ref 6.5–8.1)

## 2024-09-24 LAB — SURGICAL PATHOLOGY

## 2024-09-24 LAB — PRO BRAIN NATRIURETIC PEPTIDE: Pro Brain Natriuretic Peptide: 127 pg/mL (ref <300.0)

## 2024-09-24 LAB — I-STAT CG4 LACTIC ACID, ED
Lactic Acid, Venous: 3.2 mmol/L (ref 0.5–1.9)
Lactic Acid, Venous: 3.2 mmol/L (ref 0.5–1.9)

## 2024-09-24 LAB — T4: T4, Total: 7.5 ug/dL (ref 4.5–12.0)

## 2024-09-24 LAB — TROPONIN T, HIGH SENSITIVITY
Troponin T High Sensitivity: <15 ng/L (ref 0–19)
Troponin T High Sensitivity: <15 ng/L (ref 0–19)

## 2024-09-24 LAB — LIPASE, BLOOD: Lipase: 24 U/L (ref 11–51)

## 2024-09-24 LAB — CANCER ANTIGEN 27.29: CA 27.29: 1373.9 U/mL — ABNORMAL HIGH (ref 0.0–38.6)

## 2024-09-24 MED ORDER — IOHEXOL 350 MG/ML SOLN
100.0000 mL | Freq: Once | INTRAVENOUS | Status: AC | PRN
  Administered 2024-09-24: 100 mL via INTRAVENOUS

## 2024-09-24 MED ORDER — PIPERACILLIN-TAZOBACTAM 3.375 G IVPB 30 MIN
3.3750 g | Freq: Once | INTRAVENOUS | Status: AC
  Administered 2024-09-24: 3.375 g via INTRAVENOUS
  Filled 2024-09-24: qty 50

## 2024-09-24 MED ORDER — MORPHINE SULFATE (PF) 2 MG/ML IV SOLN
2.0000 mg | INTRAVENOUS | Status: DC | PRN
  Administered 2024-09-25 (×2): 2 mg via INTRAVENOUS
  Filled 2024-09-24 (×2): qty 1

## 2024-09-24 MED ORDER — ACETAMINOPHEN 650 MG RE SUPP: 650.0000 mg | Freq: Four times a day (QID) | RECTAL | Status: DC | PRN

## 2024-09-24 MED ORDER — DOCUSATE SODIUM 100 MG PO CAPS: 100.0000 mg | ORAL_CAPSULE | Freq: Every day | ORAL | Status: DC | PRN

## 2024-09-24 MED ORDER — ONDANSETRON HCL 4 MG PO TABS: 4.0000 mg | ORAL_TABLET | Freq: Four times a day (QID) | ORAL | Status: DC | PRN

## 2024-09-24 MED ORDER — POTASSIUM CHLORIDE 10 MEQ/100ML IV SOLN
10.0000 meq | INTRAVENOUS | Status: AC
  Administered 2024-09-24 – 2024-09-25 (×4): 10 meq via INTRAVENOUS
  Filled 2024-09-24 (×4): qty 100

## 2024-09-24 MED ORDER — VANCOMYCIN HCL 1250 MG/250ML IV SOLN
1250.0000 mg | INTRAVENOUS | Status: AC
  Administered 2024-09-24: 1250 mg via INTRAVENOUS
  Filled 2024-09-24: qty 250

## 2024-09-24 MED ORDER — VANCOMYCIN HCL 1250 MG/250ML IV SOLN
1250.0000 mg | INTRAVENOUS | Status: DC
  Filled 2024-09-24: qty 250

## 2024-09-24 MED ORDER — BISACODYL 5 MG PO TBEC: 5.0000 mg | DELAYED_RELEASE_TABLET | Freq: Every day | ORAL | Status: DC | PRN

## 2024-09-24 MED ORDER — ACETAMINOPHEN 325 MG PO TABS: 650.0000 mg | ORAL_TABLET | Freq: Four times a day (QID) | ORAL | Status: DC | PRN

## 2024-09-24 MED ORDER — FAMOTIDINE 20 MG PO TABS
20.0000 mg | ORAL_TABLET | Freq: Two times a day (BID) | ORAL | Status: DC
  Administered 2024-09-24 – 2024-09-26 (×3): 20 mg via ORAL
  Filled 2024-09-24 (×4): qty 1

## 2024-09-24 MED ORDER — ONDANSETRON HCL 4 MG/2ML IJ SOLN
4.0000 mg | Freq: Four times a day (QID) | INTRAMUSCULAR | Status: DC | PRN
  Administered 2024-09-25 – 2024-09-26 (×3): 4 mg via INTRAVENOUS
  Filled 2024-09-24 (×3): qty 2

## 2024-09-24 MED ORDER — SODIUM CHLORIDE 0.9 % IV BOLUS
500.0000 mL | Freq: Once | INTRAVENOUS | Status: AC
  Administered 2024-09-24: 500 mL via INTRAVENOUS

## 2024-09-24 NOTE — ED Notes (Signed)
 Pt. I-stat GG4 Lactic Acid result 3.19, EDP, Lemly made aware.

## 2024-09-24 NOTE — ED Triage Notes (Signed)
 Pt presents with shob and cough.  Reports having a thoracentesis twice last week and felt she needed abdominal draining as well however she reports on the US  she was told there wasn't enough to drain at that time.  Pt feels abdomen is more distended. SHOB has increased.  Oncologist recommended she come to the ED for eval.  Has had chills but no recorded fevers at home.

## 2024-09-24 NOTE — Progress Notes (Signed)
 ED Pharmacy Antibiotic Sign Off An antibiotic consult was received from an ED provider for Vanco per pharmacy dosing for sepsis. A chart review was completed to assess appropriateness.   The following one time order(s) were placed:  Vanco 1250mg  IV x 1  Further antibiotic and/or antibiotic pharmacy consults should be ordered by the admitting provider if indicated.   Thank you for allowing pharmacy to be a part of this patient's care.   Rudransh Bellanca Karoline Marina, PharmD, BCPS Clinical Staff Pharmacist  Marina Camelia Karoline, [facility]  Clinical Pharmacist 09/24/24 5:49 PM

## 2024-09-24 NOTE — ED Provider Notes (Signed)
 [place] [facility] AT [facility] Provider Note   CSN: [id number] Arrival date & time: 09/24/24  1621     Patient presents with: Shortness of Breath   April [name] is a 63 y.o. female.    Shortness of Breath    Patient presents because of shortness of breath as well as abdominal pain.  In terms of shortness of breath,'s been increasing in nature.  Patient states that she had thoracentesis x 2 last week which did not really seem to alleviate her shortness of breath.  Still endorsing shortness of breath as well as a cough.  Nonproductive.  Endorses chills but no fevers.  No history of DVT or PE.  Patient feels like she is just abnormal cardio in order to breathe this moment of time.  No exertional chest pain.  Endorsing generalized abdominal pain with increasing abdominal swelling with increased fluid.  Pain both left lower quadrant and right lower quadrant.  No nausea vomiting or diarrhea.  Bowel moods been regular.  Still passing flatulence.  No dysuria.  No hematuria.  Previous medical history reviewed : Follows with oncology.  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  Thoracentesis x 2 for pleural effusion. Stopping pembrolizumab   and starting abraxane  in 1 week.    Prior to Admission medications   Medication Sig Start Date End Date Taking? Authorizing Provider  buPROPion  (WELLBUTRIN  XL) 150 MG 24 hr tablet Take 1 tablet (150 mg total) by mouth daily. 05/08/24  Yes Billy Philippe JONELLE, NP  Denosumab  (XGEVA  Zion) Inject 120 mg into the skin every 3 (three) months.   Yes [provider]  loratadine  (CLARITIN ) 10 MG tablet Take 10 mg by mouth in the morning.   Yes [provider]  magic mouthwash (nystatin , lidocaine , diphenhydrAMINE , alum & mag hydroxide) suspension Take 5 mLs by mouth 4 (four) times daily as needed for mouth pain. Suspension contains equal amounts of Maalox Extra Strength, nystatin , diphenhydramine  and  lidocaine . 03/07/24  Yes Causey, Morna Pickle, NP  Morphine  Sulfate (MORPHINE  CONCENTRATE) 10 mg / 0.5 ml concentrated solution Take 0.5 mLs (10 mg total) by mouth every 2 (two) hours as needed. Patient taking differently: Take 10 mg by mouth every 2 (two) hours as needed (for pain). 05/15/24  Yes Gudena, Vinay, MD  naproxen sodium (ALEVE) 220 MG tablet Take 440-660 mg by mouth 2 (two) times daily as needed (pain).   Yes [provider]  omeprazole  (PRILOSEC OTC) 20 MG tablet Take 20 mg by mouth daily before breakfast.   Yes [provider]  potassium chloride  (KLOR-CON ) 10 MEQ tablet Take 1 tablet (10 mEq total) by mouth 2 (two) times daily. 09/02/24  Yes Causey, Morna Pickle, NP  rizatriptan  (MAXALT ) 5 MG tablet Take 1 tablet (5 mg total) by mouth as needed for migraine. May repeat in 2 hours if needed 01/09/23  Yes Causey, Morna Pickle, NP  valACYclovir  (VALTREX ) 1000 MG tablet Take 1 tablet (1,000 mg total) by mouth 2 (two) times daily. 03/04/24  Yes Causey, Morna Pickle, NP  cyclobenzaprine  (FLEXERIL ) 5 MG tablet Take 1 tablet (5 mg total) by mouth 3 (three) times daily as needed for muscle spasms. Patient not taking: Reported on 09/24/2024 10/23/23   Odean Potts, MD  KLOR-CON  M10 10 MEQ tablet TAKE 1 TABLET BY MOUTH EVERY DAY Patient not taking: Reported on 09/24/2024 11/14/23   Gudena, Vinay, MD    Allergies: Datopotamab deruxtecan, Eribulin  mesylate, Codeine , Cymbalta  [duloxetine  hcl], Hydrocodone ,  Ultram  [tramadol  hcl], and Venlafaxine     Review of Systems  Respiratory:  Positive for shortness of breath.     Updated Vital Signs BP 122/81   Pulse (!) 109   Temp 99 F (37.2 C) (Oral)   Resp (!) 24   SpO2 94%   Physical Exam  (all labs ordered are listed, but only abnormal results are displayed) Labs Reviewed  COMPREHENSIVE METABOLIC PANEL WITH GFR - Abnormal; Notable for the following components:      Result Value   Potassium 3.3 (*)    Calcium  8.8 (*)    Total Protein 6.2 (*)    Albumin  2.9 (*)    AST 133 (*)    ALT 53 (*)    Alkaline Phosphatase 571 (*)    Total Bilirubin 3.4 (*)    All other components within normal limits  CBC WITH DIFFERENTIAL/PLATELET - Abnormal; Notable for the following components:   RBC 3.54 (*)    MCV 109.6 (*)    MCH 37.9 (*)    RDW 16.2 (*)    Lymphs Abs 0.4 (*)    All other components within normal limits  URINALYSIS, W/ REFLEX TO CULTURE (INFECTION SUSPECTED) - Abnormal; Notable for the following components:   Color, Urine AMBER (*)    APPearance HAZY (*)    Hgb urine dipstick MODERATE (*)    Leukocytes,Ua MODERATE (*)    Bacteria, UA RARE (*)    All other components within normal limits  I-STAT CG4 LACTIC ACID, ED - Abnormal; Notable for the following components:   Lactic Acid, Venous 3.2 (*)    All other components within normal limits  I-STAT CG4 LACTIC ACID, ED - Abnormal; Notable for the following components:   Lactic Acid, Venous 3.2 (*)    All other components within normal limits  CULTURE, BLOOD (ROUTINE X 2)  CULTURE, BLOOD (ROUTINE X 2)  URINE CULTURE  LIPASE, BLOOD  PRO BRAIN NATRIURETIC PEPTIDE  BASIC METABOLIC PANEL WITH GFR  CBC  TROPONIN T, HIGH SENSITIVITY  TROPONIN T, HIGH SENSITIVITY    EKG: None  Radiology: CT Angio Chest PE W and/or Wo Contrast Result Date: 09/24/2024 CLINICAL DATA:  Known metastatic breast cancer. Concern for pulmonary embolism. Abdominal pain. EXAM: CT ANGIOGRAPHY CHEST CT ABDOMEN AND PELVIS WITH CONTRAST TECHNIQUE: Multidetector CT imaging of the chest was performed using the standard protocol during bolus administration of intravenous contrast. Multiplanar CT image reconstructions and MIPs were obtained to evaluate the vascular anatomy. Multidetector CT imaging of the abdomen and pelvis was performed using the standard protocol during bolus administration of intravenous contrast. RADIATION DOSE REDUCTION: This exam was performed according to  the departmental dose-optimization program which includes automated exposure control, adjustment of the mA and/or kV according to patient size and/or use of iterative reconstruction technique. CONTRAST:  OMNIPAQUE  IOHEXOL  350 MG/ML SOLN COMPARISON:  CT dated 09/19/2024. FINDINGS: CTA CHEST FINDINGS Cardiovascular: There is no cardiomegaly or pericardial effusion. Right-sided Port-A-Cath with tip in the right atrium. The thoracic aorta is unremarkable. The origins of the great vessels of the aortic arch appear patent. No pulmonary artery embolus identified. Mediastinum/Nodes: Mildly enlarged bilateral hilar soft tissues, likely adenopathy. The esophagus is grossly unremarkable. Lungs/Pleura: Moderate right and small left pleural effusion, increased in size since the prior CT. Partial compressive atelectasis of the adjacent lungs. There is diffuse airspace ground-glass density which may represent combination of edema and atelectasis or superimposed pneumonia. Rounded right middle lobe mass measures 3.4 x 2.8 cm.  Additional ovoid consolidation at the right lung base similar to prior CT may represent rounded atelectasis. Overall interval increase in the diffuse subpleural and pulmonary nodularity which may represent edema, pneumonia, or progression of metastatic disease. No pneumothorax. The central airways are patent. Musculoskeletal: No acute osseous pathology. Degenerative changes spine. Similar sclerotic changes of T8 may be degenerative. Metastatic disease is not excluded. Progression of diffuse skin thickening and subcutaneous edema. Left axillary adenopathy measures up to 12 mm short axis. Right axillary surgical clips. Review of the MIP images confirms the above findings. CT ABDOMEN and PELVIS FINDINGS No intra-abdominal free air. Small ascites, slightly increased since the prior CT. Hepatobiliary: Multiple hepatic metastatic disease. The largest lesion in the right lobe measures 5.7 x 5.1 cm, similar or  minimally increased in size when accounting for difference in measurement technique. No biliary dilatation. Calcified gallstone. Pancreas: Unremarkable. No pancreatic ductal dilatation or surrounding inflammatory changes. Spleen: Normal in size without focal abnormality. Adrenals/Urinary Tract: The adrenal glands are unremarkable. There is no hydronephrosis on either side. There is symmetric enhancement and excretion of contrast by both kidneys. The urinary bladder is grossly unremarkable. Stomach/Bowel: There is sigmoid diverticulosis. Diffusely thickened colon likely related to ascites. Colitis is less likely but not excluded. No bowel obstruction. Appendectomy. Vascular/Lymphatic: The abdominal aorta and IVC unremarkable. No portal venous gas. No obvious adenopathy. Reproductive: The uterus is grossly unremarkable. No suspicious adnexal masses. Other: Diffuse subcutaneous edema progressed since the prior CT. Musculoskeletal: Osteopenia with degenerative changes. Similar appearance of L1 and L2 sclerotic changes. No acute osseous pathology. Review of the MIP images confirms the above findings. IMPRESSION: 1. No CT evidence of pulmonary artery embolus. 2. Moderate right and small left pleural effusion, increased in size since the prior CT. 3. Diffuse airspace ground-glass density may represent combination of edema and atelectasis or superimposed pneumonia. 4. Overall interval increase in the diffuse subpleural and pulmonary nodularity which may represent edema, pneumonia, or progression of metastatic disease. 5. Multiple hepatic metastatic disease, similar or minimally increased in size since the prior CT. 6. Small ascites, slightly increased since the prior CT. 7. Diffusely thickened colon likely related to ascites. Colitis is less likely but not excluded. No bowel obstruction. 8. Cholelithiasis. 9. Sigmoid diverticulosis. Electronically Signed   By: Vanetta Chou M.D.   On: 09/24/2024 19:24   CT ABDOMEN  PELVIS W CONTRAST Result Date: 09/24/2024 CLINICAL DATA:  Known metastatic breast cancer. Concern for pulmonary embolism. Abdominal pain. EXAM: CT ANGIOGRAPHY CHEST CT ABDOMEN AND PELVIS WITH CONTRAST TECHNIQUE: Multidetector CT imaging of the chest was performed using the standard protocol during bolus administration of intravenous contrast. Multiplanar CT image reconstructions and MIPs were obtained to evaluate the vascular anatomy. Multidetector CT imaging of the abdomen and pelvis was performed using the standard protocol during bolus administration of intravenous contrast. RADIATION DOSE REDUCTION: This exam was performed according to the departmental dose-optimization program which includes automated exposure control, adjustment of the mA and/or kV according to patient size and/or use of iterative reconstruction technique. CONTRAST:  OMNIPAQUE  IOHEXOL  350 MG/ML SOLN COMPARISON:  CT dated 09/19/2024. FINDINGS: CTA CHEST FINDINGS Cardiovascular: There is no cardiomegaly or pericardial effusion. Right-sided Port-A-Cath with tip in the right atrium. The thoracic aorta is unremarkable. The origins of the great vessels of the aortic arch appear patent. No pulmonary artery embolus identified. Mediastinum/Nodes: Mildly enlarged bilateral hilar soft tissues, likely adenopathy. The esophagus is grossly unremarkable. Lungs/Pleura: Moderate right and small left pleural effusion, increased in size since  the prior CT. Partial compressive atelectasis of the adjacent lungs. There is diffuse airspace ground-glass density which may represent combination of edema and atelectasis or superimposed pneumonia. Rounded right middle lobe mass measures 3.4 x 2.8 cm. Additional ovoid consolidation at the right lung base similar to prior CT may represent rounded atelectasis. Overall interval increase in the diffuse subpleural and pulmonary nodularity which may represent edema, pneumonia, or progression of metastatic disease. No  pneumothorax. The central airways are patent. Musculoskeletal: No acute osseous pathology. Degenerative changes spine. Similar sclerotic changes of T8 may be degenerative. Metastatic disease is not excluded. Progression of diffuse skin thickening and subcutaneous edema. Left axillary adenopathy measures up to 12 mm short axis. Right axillary surgical clips. Review of the MIP images confirms the above findings. CT ABDOMEN and PELVIS FINDINGS No intra-abdominal free air. Small ascites, slightly increased since the prior CT. Hepatobiliary: Multiple hepatic metastatic disease. The largest lesion in the right lobe measures 5.7 x 5.1 cm, similar or minimally increased in size when accounting for difference in measurement technique. No biliary dilatation. Calcified gallstone. Pancreas: Unremarkable. No pancreatic ductal dilatation or surrounding inflammatory changes. Spleen: Normal in size without focal abnormality. Adrenals/Urinary Tract: The adrenal glands are unremarkable. There is no hydronephrosis on either side. There is symmetric enhancement and excretion of contrast by both kidneys. The urinary bladder is grossly unremarkable. Stomach/Bowel: There is sigmoid diverticulosis. Diffusely thickened colon likely related to ascites. Colitis is less likely but not excluded. No bowel obstruction. Appendectomy. Vascular/Lymphatic: The abdominal aorta and IVC unremarkable. No portal venous gas. No obvious adenopathy. Reproductive: The uterus is grossly unremarkable. No suspicious adnexal masses. Other: Diffuse subcutaneous edema progressed since the prior CT. Musculoskeletal: Osteopenia with degenerative changes. Similar appearance of L1 and L2 sclerotic changes. No acute osseous pathology. Review of the MIP images confirms the above findings. IMPRESSION: 1. No CT evidence of pulmonary artery embolus. 2. Moderate right and small left pleural effusion, increased in size since the prior CT. 3. Diffuse airspace ground-glass  density may represent combination of edema and atelectasis or superimposed pneumonia. 4. Overall interval increase in the diffuse subpleural and pulmonary nodularity which may represent edema, pneumonia, or progression of metastatic disease. 5. Multiple hepatic metastatic disease, similar or minimally increased in size since the prior CT. 6. Small ascites, slightly increased since the prior CT. 7. Diffusely thickened colon likely related to ascites. Colitis is less likely but not excluded. No bowel obstruction. 8. Cholelithiasis. 9. Sigmoid diverticulosis. Electronically Signed   By: Vanetta Chou M.D.   On: 09/24/2024 19:24   DG Chest 2 View Result Date: 09/24/2024 CLINICAL DATA:  Cough. EXAM: CHEST - 2 VIEW COMPARISON:  Chest radiograph dated 09/18/2024. FINDINGS: Right-sided Port-A-Cath in similar position. Small right pleural effusion with associated right lung base atelectasis or infiltrate, increased since the prior radiograph. Right lower lung field rounded mass measures 3.5 cm. No pneumothorax. The cardiac silhouette is within normal limits. No acute osseous pathology. IMPRESSION: 1. Small right pleural effusion with associated right lung base atelectasis or infiltrate, increased since the prior radiograph. 2. Right lower lung field rounded mass. Electronically Signed   By: Vanetta Chou M.D.   On: 09/24/2024 17:34     Procedures   Medications Ordered in the ED  ondansetron  (ZOFRAN ) tablet 4 mg (has no administration in time range)    Or  ondansetron  (ZOFRAN ) injection 4 mg (has no administration in time range)  bisacodyl  (DULCOLAX) EC tablet 5 mg (has no administration in time range)  acetaminophen  (TYLENOL ) tablet 650 mg (has no administration in time range)    Or  acetaminophen  (TYLENOL ) suppository 650 mg (has no administration in time range)  potassium chloride  10 mEq in 100 mL IVPB (10 mEq Intravenous New Bag/Given 09/24/24 2237)  morphine  (PF) 2 MG/ML injection 2 mg (has no  administration in time range)  docusate sodium  (COLACE) capsule 100 mg (has no administration in time range)  famotidine  (PEPCID ) tablet 20 mg (20 mg Oral Given 09/24/24 2139)  piperacillin -tazobactam (ZOSYN ) IVPB 3.375 g (0 g Intravenous Stopped 09/24/24 1839)  vancomycin  (VANCOREADY) IVPB 1250 mg/250 mL (0 mg Intravenous Stopped 09/24/24 1925)  iohexol  (OMNIPAQUE ) 350 MG/ML injection 100 mL (100 mLs Intravenous Contrast Given 09/24/24 1856)  sodium chloride  0.9 % bolus 500 mL (0 mLs Intravenous Stopped 09/24/24 2236)                                    Medical Decision Making Amount and/or Complexity of Data Reviewed Labs: ordered. Radiology: ordered.  Risk Prescription drug management. Decision regarding hospitalization.     HPI:     Patient presents because of shortness of breath as well as abdominal pain.  In terms of shortness of breath,'s been increasing in nature.  Patient states that she had thoracentesis x 2 last week which did not really seem to alleviate her shortness of breath.  Still endorsing shortness of breath as well as a cough.  Nonproductive.  Endorses chills but no fevers.  No history of DVT or PE.  Patient feels like she is just abnormal cardio in order to breathe this moment of time.  No exertional chest pain.  Endorsing generalized abdominal pain with increasing abdominal swelling with increased fluid.  Pain both left lower quadrant and right lower quadrant.  No nausea vomiting or diarrhea.  Bowel moods been regular.  Still passing flatulence.  No dysuria.  No hematuria.  Previous medical history reviewed : Follows with oncology.  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  Thoracentesis x 2 for pleural effusion. Stopping pembrolizumab   and starting abraxane  in 1 week.  MDM:   Upon exam, patient slightly tachycardic.  Otherwise maps appropriate.  Hemodynamically stable.  Patient has decreased breath sounds the right side.  Likely consistent  for pleural effusion.  Obtain laboratory workup.  EKG.  Will make sure there is no arrhythmia.  Obtain CT of the chest without PE.  Likely pleural effusion again but will rule out PE given risk factors.  Obtain CT abdomen pelvis.  Abdominal pain is likely because of metastatic disease as well as worsening ascites but want to rule out any kind of evidence of ileus obstruction.  Reevaluation:   Patient still mentating well.  No new symptoms upon reexamination.  Patient's labs showed lactic acidosis.  Given her tachycardia and cough, did start patient on vancomycin  and Zosyn  to cover for pneumonia.  Felt like I need to cover broad-spectrum in the setting of patient's abdominal pain as well to cover for any kind of GI source.  Labs showed elevated liver enzymes but these have been elevated in the past.  CT scan consistent for infiltrate concerning for pneumonia.  No PE.  Moderate increase in size of the right-sided pleural effusion.  Lactic acid persistently elevated x 2.  Give very small bolus of fluid but otherwise do not want to give patient large volume with fluid given patient already had  a pleural effusion and otherwise maps appropriate.  Patient admitted for further care.  Interventions: vancomycin , zosyn , 500 cc bolus   EKG Interpreted by Me: Sinus tach   Cardiac Tele Interpreted by Me: sinus tach   I have independently interpreted the CXR  and CT  images and agree with the radiologist finding   Social Determinant of Health: None    Disposition and Follow Up: Admit    CRITICAL CARE Performed by: Lavonia LOISE Pat   Total critical care time: 45 minutes  Critical care time was exclusive of separately billable procedures and treating other patients.  Critical care was necessary to treat or prevent imminent or life-threatening deterioration.  Critical care was time spent personally by me on the following activities: development of treatment plan with patient and/or  surrogate as well as nursing, discussions with consultants, evaluation of patient's response to treatment, examination of patient, obtaining history from patient or surrogate, ordering and performing treatments and interventions, ordering and review of laboratory studies, ordering and review of radiographic studies, pulse oximetry and re-evaluation of patient's condition.      Final diagnoses:  Pneumonia of right lower lobe due to infectious organism  Lactic acid acidosis  Tachycardia    ED Discharge Orders     None          Pat Lavonia LOISE, MD 09/24/24 2326

## 2024-09-24 NOTE — H&P (Signed)
 History and Physical    Patient: April [name] FMW:[id number] DOB: 09-26-1961 DOA: 09/24/2024 DOS: the patient was seen and examined on 09/24/2024 PCP: Billy Philippe JONELLE, NP  Patient coming from: Home  Chief Complaint:  Chief Complaint  Patient presents with   Shortness of Breath   HPI: April [name] is a 63 y.o. female with medical history significant for metastatic breast cancer with liver and lung mets.  The patient has not responded to multiple different chemotherapy regimens and her oncologist has now recommended her for hospice.  The patient has decided to try 1 more chemotherapy first. She presents today with abdominal discomfort.  She says her abdomen feels very full and it hurts in her RUQ and into her back.  She also has a very severe cough which has been worsening over the last 2 weeks and is now really severe.  The cough makes her abdomen will hurt worse.  The patient is status post thoracentesis x 2 over the last couple weeks but that has not helped her cough. She denies any fevers or chills.  She does have skin inflammatory changes across her breast and upper chest chest and into her abdomen from her breast cancer.  She reports that there she has a lot of oozing and even oozing blood from the lesions on her skin.  She has been taking some ibuprofen  which helps a little.  She denies any nausea vomiting or diarrhea.   Review of Systems: As mentioned in the history of present illness. All other systems reviewed and are negative. Past Medical History:  Diagnosis Date   Allergy 07/28/2012   Seasonal/Environmental allergies   Anxiety 2013   Since 2013   Arthritis 2014 per patient    knees and shoulders   Bilateral ankle fractures 07/2015   Booted and resolved    Cancer [facility]) dx June 22, 2016   right breast   Depression 2013   Multiple  episodes  in past.   Elevated cholesterol 2017   Fibromyalgia 2013   diagnosed by Dr. Dolphus   Genital herpes 2005   Has  outbreaks monthly if not on preventative medication   GERD (gastroesophageal reflux disease) 2013   History of radiation therapy 02/07/17- 03/21/17   Right Breast- 4 field 25 fractions. 50 Gy to SCLV/PAB in 25 fractions. Right Breast Boost 10 gy in 5 fractions.   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 [facility])    Metastatic adenocarcinoma to bone [facility])    Metastatic adenocarcinoma to liver [facility])    Migraine 2013   migraines   Neuromuscular disorder (HCC) 03/20/2017   neuropathy in fingers and toes from Chemo--intermittent   Obesity    Osteoporosis 03/23/2017   noted per bone density scan   Peripheral neuropathy 08/13/2017   Personal history of chemotherapy 11/2016   Personal history of radiation therapy    4/18   Right wrist fracture 06/2015   Resolved   Scoliosis of thoracic spine 01/04/2012   Skin condition 2012   patient reports periodic episodes of severe itching. She will itch and then blister at areas including her arms, back, and buttocks.    Urinary, incontinence, stress female 07/14/2016   patient reported   Past Surgical History:  Procedure Laterality Date   AXILLARY LYMPH NODE DISSECTION Right 12/26/2016   Procedure: RIGHT AXILLARY LYMPH NODE DISSECTION;  Surgeon: Morene Olives, MD;  Location: [facility];  Service: General;  Laterality: Right;   BREAST LUMPECTOMY Right 2018  BREAST LUMPECTOMY WITH NEEDLE LOCALIZATION Right 12/19/2016   Procedure: RIGHT BREAST NEEDLE LOCALIZED LUMPECTOMY, RIGHT RADIOACTIVE SEED TARGETED AXILLARY SENTINEL LYMPH NODE BIOPSY;  Surgeon: Morene Olives, MD;  Location: [facility];  Service: General;  Laterality: Right;   IR ANGIOGRAM SELECTIVE EACH ADDITIONAL VESSEL  08/27/2023   IR ANGIOGRAM VISCERAL SELECTIVE  08/27/2023   IR EMBO ARTERIAL NOT HEMORR HEMANG INC GUIDE ROADMAPPING  08/17/2023   IR EMBO TUMOR ORGAN ISCHEMIA INFARCT INC GUIDE ROADMAPPING  08/27/2023   IR EMBO TUMOR ORGAN ISCHEMIA INFARCT INC  GUIDE ROADMAPPING  10/08/2023   IR GENERIC HISTORICAL  10/09/2016   IR CV LINE INJECTION 10/09/2016 Marcey Moan, MD [facility]   IR IMAGING GUIDED PORT INSERTION  06/14/2022   IR RADIOLOGIST EVAL & MGMT  08/02/2023   IR THORACENTESIS ASP PLEURAL SPACE W/IMG GUIDE  09/18/2024   IR US  GUIDE VASC ACCESS RIGHT  08/27/2023   LAPAROSCOPIC APPENDECTOMY N/A 11/28/2018   Procedure: APPENDECTOMY LAPAROSCOPIC;  Surgeon: Signe Mitzie LABOR, MD;  Location: [facility];  Service: General;  Laterality: N/A;   PORT-A-CATH REMOVAL Left 12/19/2016   Procedure: REMOVAL PORT-A-CATH;  Surgeon: Morene Olives, MD;  Location: [facility];  Service: General;  Laterality: Left;   PORTA CATH INSERTION  2021   PORTACATH PLACEMENT N/A 07/11/2016   Procedure: INSERTION PORT-A-CATH;  Surgeon: Morene Olives, MD;  Location: [facility];  Service: General;  Laterality: N/A;   RADIOACTIVE SEED GUIDED AXILLARY SENTINEL LYMPH NODE Right 12/19/2016   Procedure: RADIOACTIVE SEED GUIDED AXILLARY SENTINEL LYMPH NODE BIOPSY;  Surgeon: Morene Olives, MD;  Location: [facility];  Service: General;  Laterality: Right;   WISDOM TOOTH EXTRACTION  yrs ago   Social History:  reports that she quit smoking about 30 years ago. Her smoking use included cigarettes. She started smoking about 45 years ago. She has a 7.5 pack-year smoking history. She has quit using smokeless tobacco. She reports current alcohol use of about 2.0 - 4.0 standard drinks of alcohol per week. She reports that she does not currently use drugs after having used the following drugs: Marijuana.  Allergies  Allergen Reactions   Datopotamab Deruxtecan Other (See Comments) and Cough    Pt had hypersensitivity reaction which included, coughing and congestion at the end of infusion. See Progress note dated 04/15/2024.   Eribulin  Mesylate Palpitations    Patient developed a flushing sensation and palpitations after her treatment had just completed.    Codeine  Nausea And Vomiting   Cymbalta  [Duloxetine  Hcl] Other (See Comments)    Causes sores on arm   Hydrocodone  Nausea Only and Other (See Comments)    dizziness   Ultram  [Tramadol  Hcl] Nausea Only   Venlafaxine  Other (See Comments)    Causes sores on arm    Family History  Problem Relation Age of Onset   Arthritis Mother    Hypertension Mother    Heart disease Mother    Dementia Mother    Irritable bowel syndrome Mother    Emphysema Father    Cancer Father        bladder   Cerebral aneurysm Father        ruptured aneurysm was cause of death   Graves' disease Sister    Vitiligo Sister    Mental illness Sister        likely undiagnosed schizophrenia   Hyperlipidemia Brother    Mental illness Brother        Depression   Mental illness Brother  Schizophrenia   ADD / ADHD Daughter    Depression Daughter     Prior to Admission medications   Medication Sig Start Date End Date Taking? Authorizing Provider  buPROPion  (WELLBUTRIN  XL) 150 MG 24 hr tablet Take 1 tablet (150 mg total) by mouth daily. 05/08/24   Williamson, Joanna R, NP  cromolyn (OPTICROM) 4 % ophthalmic solution SMARTSIG:In Eye(s) 04/18/24   [provider]  cyclobenzaprine  (FLEXERIL ) 5 MG tablet Take 1 tablet (5 mg total) by mouth 3 (three) times daily as needed for muscle spasms. 10/23/23   Gudena, Vinay, MD  Denosumab  (XGEVA  Coates) Inject 120 mg into the skin every 3 (three) months.    [provider]  KLOR-CON  M10 10 MEQ tablet TAKE 1 TABLET BY MOUTH EVERY DAY 11/14/23   Gudena, Vinay, MD  loratadine  (CLARITIN ) 10 MG tablet Take 10 mg by mouth daily.    [provider]  magic mouthwash (nystatin , lidocaine , diphenhydrAMINE , alum & mag hydroxide) suspension Take 5 mLs by mouth 4 (four) times daily as needed for mouth pain. Suspension contains equal amounts of Maalox Extra Strength, nystatin , diphenhydramine  and lidocaine . 03/07/24   Causey, Morna Pickle, NP  Morphine  Sulfate  (MORPHINE  CONCENTRATE) 10 mg / 0.5 ml concentrated solution Take 0.5 mLs (10 mg total) by mouth every 2 (two) hours as needed. 05/15/24   Gudena, Vinay, MD  naproxen sodium (ALEVE) 220 MG tablet Take 440-660 mg by mouth 2 (two) times daily as needed (pain).    [provider]  omeprazole  (PRILOSEC) 20 MG capsule Take 20 mg by mouth daily.    [provider]  potassium chloride  (KLOR-CON ) 10 MEQ tablet Take 1 tablet (10 mEq total) by mouth 2 (two) times daily. 09/02/24   Crawford Morna Pickle, NP  rizatriptan  (MAXALT ) 5 MG tablet Take 1 tablet (5 mg total) by mouth as needed for migraine. May repeat in 2 hours if needed 01/09/23   Crawford Morna Pickle, NP  valACYclovir  (VALTREX ) 1000 MG tablet Take 1 tablet (1,000 mg total) by mouth 2 (two) times daily. 03/04/24   Crawford Morna Pickle, NP  XIIDRA 5 % SOLN  04/21/24   [provider]    Physical Exam: Vitals:   09/24/24 1630 09/24/24 1830  BP: 139/88 122/81  Pulse: (!) 117 (!) 109  Resp: 18 (!) 24  Temp: 98.3 F (36.8 C)   TempSrc: Oral   SpO2: 93% 94%     Data Reviewed:  Results for orders placed or performed during the hospital encounter of 09/24/24 (from the past 24 hours)  Comprehensive metabolic panel     Status: Abnormal   Collection Time: 09/24/24  5:13 PM  Result Value Ref Range   Sodium 136 135 - 145 mmol/L   Potassium 3.3 (L) 3.5 - 5.1 mmol/L   Chloride 102 98 - 111 mmol/L   CO2 22 22 - 32 mmol/L   Glucose, Bld 89 70 - 99 mg/dL   BUN 9 8 - 23 mg/dL   Creatinine, Ser 9.53 0.44 - 1.00 mg/dL   Calcium 8.8 (L) 8.9 - 10.3 mg/dL   Total Protein 6.2 (L) 6.5 - 8.1 g/dL   Albumin  2.9 (L) 3.5 - 5.0 g/dL   AST 866 (H) 15 - 41 U/L   ALT 53 (H) 0 - 44 U/L   Alkaline Phosphatase 571 (H) 38 - 126 U/L   Total Bilirubin 3.4 (H) 0.0 - 1.2 mg/dL   GFR, Estimated >39 >39 mL/min   Anion gap 12 5 -  15  CBC with Differential     Status: Abnormal   Collection Time: 09/24/24  5:13 PM  Result Value Ref Range    WBC 4.4 4.0 - 10.5 K/uL   RBC 3.54 (L) 3.87 - 5.11 MIL/uL   Hemoglobin 13.4 12.0 - 15.0 g/dL   HCT 61.1 63.9 - 53.9 %   MCV 109.6 (H) 80.0 - 100.0 fL   MCH 37.9 (H) 26.0 - 34.0 pg   MCHC 34.5 30.0 - 36.0 g/dL   RDW 83.7 (H) 88.4 - 84.4 %   Platelets 161 150 - 400 K/uL   nRBC 0.0 0.0 - 0.2 %   Neutrophils Relative % 64 %   Neutro Abs 2.8 1.7 - 7.7 K/uL   Lymphocytes Relative 9 %   Lymphs Abs 0.4 (L) 0.7 - 4.0 K/uL   Monocytes Relative 22 %   Monocytes Absolute 1.0 0.1 - 1.0 K/uL   Eosinophils Relative 3 %   Eosinophils Absolute 0.1 0.0 - 0.5 K/uL   Basophils Relative 1 %   Basophils Absolute 0.0 0.0 - 0.1 K/uL   Immature Granulocytes 1 %   Abs Immature Granulocytes 0.02 0.00 - 0.07 K/uL  Lipase, blood     Status: None   Collection Time: 09/24/24  5:13 PM  Result Value Ref Range   Lipase 24 11 - 51 U/L  Troponin T, High Sensitivity     Status: None   Collection Time: 09/24/24  5:13 PM  Result Value Ref Range   Troponin T High Sensitivity <15 0 - 19 ng/L  Pro Brain natriuretic peptide     Status: None   Collection Time: 09/24/24  5:13 PM  Result Value Ref Range   Pro Brain Natriuretic Peptide 127.0 <300.0 pg/mL  I-Stat Lactic Acid, ED     Status: Abnormal   Collection Time: 09/24/24  5:31 PM  Result Value Ref Range   Lactic Acid, Venous 3.2 (HH) 0.5 - 1.9 mmol/L   Comment NOTIFIED PHYSICIAN   Urinalysis, w/ Reflex to Culture (Infection Suspected) -Urine, Clean Catch     Status: Abnormal   Collection Time: 09/24/24  6:11 PM  Result Value Ref Range   Specimen Source URINE, CLEAN CATCH    Color, Urine AMBER (A) YELLOW   APPearance HAZY (A) CLEAR   Specific Gravity, Urine 1.015 1.005 - 1.030   pH 5.0 5.0 - 8.0   Glucose, UA NEGATIVE NEGATIVE mg/dL   Hgb urine dipstick MODERATE (A) NEGATIVE   Bilirubin Urine NEGATIVE NEGATIVE   Ketones, ur NEGATIVE NEGATIVE mg/dL   Protein, ur NEGATIVE NEGATIVE mg/dL   Nitrite NEGATIVE NEGATIVE   Leukocytes,Ua MODERATE (A) NEGATIVE    RBC / HPF 6-10 0 - 5 RBC/hpf   WBC, UA >50 0 - 5 WBC/hpf   Bacteria, UA RARE (A) NONE SEEN   Squamous Epithelial / HPF 0-5 0 - 5 /HPF   Mucus PRESENT   Troponin T, High Sensitivity     Status: None   Collection Time: 09/24/24  7:12 PM  Result Value Ref Range   Troponin T High Sensitivity <15 0 - 19 ng/L  I-Stat Lactic Acid, ED     Status: Abnormal   Collection Time: 09/24/24  7:28 PM  Result Value Ref Range   Lactic Acid, Venous 3.2 (HH) 0.5 - 1.9 mmol/L   Comment NOTIFIED PHYSICIAN    *Note: Due to a large number of results and/or encounters for the requested time period, some results have not been displayed. A complete set  of results can be found in Results Review.   CT of abdomen pelvis and chest IMPRESSION: 1. No CT evidence of pulmonary artery embolus. 2. Moderate right and small left pleural effusion, increased in size since the prior CT. 3. Diffuse airspace ground-glass density may represent combination of edema and atelectasis or superimposed pneumonia. 4. Overall interval increase in the diffuse subpleural and pulmonary nodularity which may represent edema, pneumonia, or progression of metastatic disease. 5. Multiple hepatic metastatic disease, similar or minimally increased in size since the prior CT. 6. Small ascites, slightly increased since the prior CT. 7. Diffusely thickened colon likely related to ascites. Colitis is less likely but not excluded. No bowel obstruction. 8. Cholelithiasis. 9. Sigmoid diverticulosis.  Assessment and Plan: Severe cough The patient has been coughing for weeks and does not think that she has pneumonia. - Will give a trial of Lasix  to see if this is fluid related - The patient has liquid morphine  at home she says this is one of the few narcotics that does not upset her stomach so we will start liquid morphine  As a cough suppressant and for pain control - Will also continue antibiotics for pneumonia.  Started on Vanco and Zosyn  in the  emergency department but may switch to Maxipime  and zithromax .  Abdominal fullness and pain -unclear etiology. She does have known liver mets.  The ascites is characterized as small.  - Will start narcotics for pain control - A trial of Lasix  in case this is ascites/edema related   3.  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  She reported that her doctor has told her that her life expectancy is likely less than 6 months but she does not feel ready for it yet.     Advance Care Planning:   Code Status: Prior  The patient names her daughter is her surrogate decision maker and wants to be full code. Consults: Palliative care  Family Communication: The patient's daughter was at bedside.  Severity of Illness: The appropriate patient status for this patient is INPATIENT. Inpatient status is judged to be reasonable and necessary in order to provide the required intensity of service to ensure the patient's safety. The patient's presenting symptoms, physical exam findings, and initial radiographic and laboratory data in the context of their chronic comorbidities is felt to place them at high risk for further clinical deterioration. Furthermore, it is not anticipated that the patient will be medically stable for discharge from the hospital within 2 midnights of admission.   * I certify that at the point of admission it is my clinical judgment that the patient will require inpatient hospital care spanning beyond 2 midnights from the point of admission due to high intensity of service, high risk for further deterioration and high frequency of surveillance required.*  Author: ARTHEA CHILD, MD 09/24/2024 8:16 PM  For on call review [email or web address].

## 2024-09-25 ENCOUNTER — Ambulatory Visit: Payer: Self-pay | Admitting: Obstetrics and Gynecology

## 2024-09-25 DIAGNOSIS — C50411 Malignant neoplasm of upper-outer quadrant of right female breast: Secondary | ICD-10-CM | POA: Diagnosis not present

## 2024-09-25 DIAGNOSIS — C787 Secondary malignant neoplasm of liver and intrahepatic bile duct: Secondary | ICD-10-CM

## 2024-09-25 DIAGNOSIS — R140 Abdominal distension (gaseous): Secondary | ICD-10-CM

## 2024-09-25 DIAGNOSIS — Z79899 Other long term (current) drug therapy: Secondary | ICD-10-CM

## 2024-09-25 DIAGNOSIS — Z66 Do not resuscitate: Secondary | ICD-10-CM

## 2024-09-25 DIAGNOSIS — R053 Chronic cough: Secondary | ICD-10-CM | POA: Diagnosis not present

## 2024-09-25 DIAGNOSIS — S21001D Unspecified open wound of right breast, subsequent encounter: Secondary | ICD-10-CM | POA: Diagnosis not present

## 2024-09-25 DIAGNOSIS — G893 Neoplasm related pain (acute) (chronic): Secondary | ICD-10-CM

## 2024-09-25 DIAGNOSIS — Z7189 Other specified counseling: Secondary | ICD-10-CM

## 2024-09-25 DIAGNOSIS — Z515 Encounter for palliative care: Secondary | ICD-10-CM

## 2024-09-25 DIAGNOSIS — R531 Weakness: Secondary | ICD-10-CM

## 2024-09-25 LAB — URINE CULTURE

## 2024-09-25 LAB — HEPATIC FUNCTION PANEL
ALT: 41 U/L (ref 0–44)
AST: 108 U/L — ABNORMAL HIGH (ref 15–41)
Albumin: 2.4 g/dL — ABNORMAL LOW (ref 3.5–5.0)
Alkaline Phosphatase: 443 U/L — ABNORMAL HIGH (ref 38–126)
Bilirubin, Direct: 1.8 mg/dL — ABNORMAL HIGH (ref 0.0–0.2)
Indirect Bilirubin: 0.9 mg/dL (ref 0.3–0.9)
Total Bilirubin: 2.7 mg/dL — ABNORMAL HIGH (ref 0.0–1.2)
Total Protein: 5.2 g/dL — ABNORMAL LOW (ref 6.5–8.1)

## 2024-09-25 LAB — BASIC METABOLIC PANEL WITH GFR
Anion gap: 10 (ref 5–15)
BUN: 7 mg/dL — ABNORMAL LOW (ref 8–23)
CO2: 21 mmol/L — ABNORMAL LOW (ref 22–32)
Calcium: 8 mg/dL — ABNORMAL LOW (ref 8.9–10.3)
Chloride: 106 mmol/L (ref 98–111)
Creatinine, Ser: 0.44 mg/dL (ref 0.44–1.00)
GFR, Estimated: >60 mL/min (ref >60)
Glucose, Bld: 106 mg/dL — ABNORMAL HIGH (ref 70–99)
Potassium: 4.2 mmol/L (ref 3.5–5.1)
Sodium: 137 mmol/L (ref 135–145)

## 2024-09-25 LAB — CBC
HCT: 32.4 % — ABNORMAL LOW (ref 36.0–46.0)
Hemoglobin: 11 g/dL — ABNORMAL LOW (ref 12.0–15.0)
MCH: 37 pg — ABNORMAL HIGH (ref 26.0–34.0)
MCHC: 34 g/dL (ref 30.0–36.0)
MCV: 109.1 fL — ABNORMAL HIGH (ref 80.0–100.0)
Platelets: 123 K/uL — ABNORMAL LOW (ref 150–400)
RBC: 2.97 MIL/uL — ABNORMAL LOW (ref 3.87–5.11)
RDW: 16.7 % — ABNORMAL HIGH (ref 11.5–15.5)
WBC: 4 K/uL (ref 4.0–10.5)
nRBC: 0 % (ref 0.0–0.2)

## 2024-09-25 MED ORDER — SODIUM CHLORIDE 0.9 % IV SOLN
2.0000 g | Freq: Three times a day (TID) | INTRAVENOUS | Status: DC
  Administered 2024-09-25 (×2): 2 g via INTRAVENOUS
  Filled 2024-09-25 (×2): qty 12.5

## 2024-09-25 MED ORDER — SODIUM CHLORIDE 0.9 % IV SOLN
500.0000 mg | INTRAVENOUS | Status: DC
  Administered 2024-09-25: 500 mg via INTRAVENOUS
  Filled 2024-09-25: qty 5

## 2024-09-25 MED ORDER — SODIUM CHLORIDE 0.9% FLUSH
10.0000 mL | Freq: Two times a day (BID) | INTRAVENOUS | Status: DC
  Administered 2024-09-25 – 2024-09-26 (×3): 10 mL

## 2024-09-25 MED ORDER — MORPHINE SULFATE (PF) 2 MG/ML IV SOLN
2.0000 mg | INTRAVENOUS | Status: DC | PRN
  Administered 2024-09-25: 2 mg via INTRAVENOUS
  Administered 2024-09-26: 3 mg via INTRAVENOUS
  Filled 2024-09-25: qty 2
  Filled 2024-09-25: qty 1

## 2024-09-25 MED ORDER — MORPHINE SULFATE (CONCENTRATE) 10 MG /0.5 ML PO SOLN
10.0000 mg | ORAL | Status: DC | PRN
  Administered 2024-09-26: 10 mg via ORAL
  Filled 2024-09-25 (×3): qty 0.5

## 2024-09-25 MED ORDER — SODIUM CHLORIDE 0.9% FLUSH: 10.0000 mL | INTRAVENOUS | Status: DC | PRN

## 2024-09-25 MED ORDER — CHLORHEXIDINE GLUCONATE CLOTH 2 % EX PADS
6.0000 | MEDICATED_PAD | Freq: Every day | CUTANEOUS | Status: DC
  Administered 2024-09-25 – 2024-09-26 (×2): 6 via TOPICAL

## 2024-09-25 MED ORDER — ENSURE PLUS HIGH PROTEIN PO LIQD
237.0000 mL | Freq: Two times a day (BID) | ORAL | Status: DC
  Administered 2024-09-25 – 2024-09-26 (×2): 237 mL via ORAL

## 2024-09-25 NOTE — Progress Notes (Signed)
   09/25/24 1509  TOC Brief Assessment  Insurance and Status Reviewed  Patient has primary care physician Yes Giacomo, Philippe SAUNDERS, NP)  Home environment has been reviewed Home  Prior level of function: Independent  Prior/Current Home Services No current home services  Social Drivers of Health Review SDOH reviewed no interventions necessary  Readmission risk has been reviewed Yes  Transition of care needs no transition of care needs at this time

## 2024-09-25 NOTE — Plan of Care (Signed)
  Problem: Education: Goal: Knowledge of General Education information will improve Description: Including pain rating scale, medication(s)/side effects and non-pharmacologic comfort measures Outcome: Progressing   Problem: Clinical Measurements: Goal: Respiratory complications will improve Outcome: Progressing Goal: Cardiovascular complication will be avoided Outcome: Progressing   Problem: Health Behavior/Discharge Planning: Goal: Ability to manage health-related needs will improve Outcome: Not Progressing   Problem: Clinical Measurements: Goal: Ability to maintain clinical measurements within normal limits will improve Outcome: Not Progressing Goal: Will remain free from infection Outcome: Not Progressing Goal: Diagnostic test results will improve Outcome: Not Progressing   Problem: Activity: Goal: Risk for activity intolerance will decrease Outcome: Not Progressing

## 2024-09-25 NOTE — Progress Notes (Signed)
 Progress Note   Patient: April [name] FMW:[id number] DOB: 09-03-1961 DOA: 09/24/2024     1 DOS: the patient was seen and examined on 09/25/2024   Brief hospital course: XENA PROPST is a 63 y.o. female with medical history significant for metastatic breast cancer with liver and lung mets, has not responded to multiple different chemotherapy regimens presented to ED with abdominal discomfort.  She says her abdomen feels very full and it hurts in her RUQ and into her back.  She also has a very severe cough which has been worsening over the last 2 weeks and is now really severe.  The cough makes her abdomen will hurt worse.  The patient is status post thoracentesis x 2 over the last couple weeks but that has not helped her cough. Admitted to [facility] service for further management evaluation.  Assessment and Plan: Severe cough- Has been having cough for more than 2 weeks. She did receive multiple courses of antibiotic therapy without any help. Continue cough suppressant, pain control. Will stop IV antibiotics as per discussion with palliative care.  Metastatic breast cancer- As per oncology her overall prognosis is poor, life expectancy less than 6 months. Plan for radiation to help with her bleeding breast lesions.  She does have right arm swelling which is chronic.  Does report abdominal distention, discomfort but no pain.  She is feeling weak, eating poor. Oncology team to see her later today. I discussed with her regarding palliative care, she agreed to proceed with palliative care discussion. Palliative discussed with her, she is made DNR, agreed with home hospice discharge plan.  Goals of care discussion- DNR/DNI, home with hospice TOC to work on hospice options. Continue wound care dressing changes for oozing skin lesions over right breast.     Out of bed to chair. Incentive spirometry. Nursing supportive care. Fall, aspiration precautions. Diet:  Diet Orders (From admission,  onward)     Start     Ordered   09/24/24 2053  Diet regular Room service appropriate? Yes; Fluid consistency: Thin  Diet effective now       Question Answer Comment  Room service appropriate? Yes   Fluid consistency: Thin      09/24/24 2053           DVT prophylaxis: SCDs Start: 09/24/24 2053  Level of care: Med-Surg   Code Status: Limited: Do not attempt resuscitation (DNR) -DNR-LIMITED -Do Not Intubate/DNI   Subjective: Patient is seen and examined today morning.  She is weak, has dry cough, did not get out of bed.  Patient did report abdominal fullness, discomfort.  I discussed with her regarding palliative care and she is willing to talk about options.  Physical Exam: Vitals:   09/25/24 0951 09/25/24 1116 09/25/24 1240 09/25/24 1317  BP:  108/71 122/80   Pulse:  (!) 102 (!) 110   Resp:  (!) 22 18   Temp: 98 F (36.7 C) 98.4 F (36.9 C) 99.5 F (37.5 C)   TempSrc: Oral Oral Oral   SpO2:  92% 95%   Weight:    85.4 kg  Height:    5' 3 (1.6 m)    General - Elderly ill looking Caucasian obese female, distress due to cough, pain HEENT - PERRLA, EOMI, atraumatic head, non tender sinuses. Lung - Clear, basal rales, rhonchi, no wheezes. Heart - S1, S2 heard, no murmurs, rubs, 1+ pedal edema, right upper extremity swelling. Abdomen - Soft, non tender, distended, bowel sounds good Neuro -  Alert, awake and oriented x 3, non focal exam. Skin - Warm and dry.  Data Reviewed:      Latest Ref Rng & Units 09/25/2024    5:30 AM 09/24/2024    5:13 PM 09/23/2024   10:25 AM  CBC  WBC 4.0 - 10.5 K/uL 4.0  4.4  3.8   Hemoglobin 12.0 - 15.0 g/dL 88.9  86.5  87.3   Hematocrit 36.0 - 46.0 % 32.4  38.8  35.4   Platelets 150 - 400 K/uL 123  161  122       Latest Ref Rng & Units 09/25/2024    5:30 AM 09/24/2024    5:13 PM 09/23/2024   10:25 AM  BMP  Glucose 70 - 99 mg/dL 893  89  78   BUN 8 - 23 mg/dL 7  9  8    Creatinine 0.44 - 1.00 mg/dL 9.55  9.53  9.66   Sodium 135 -  145 mmol/L 137  136  137   Potassium 3.5 - 5.1 mmol/L 4.2  3.3  3.3   Chloride 98 - 111 mmol/L 106  102  105   CO2 22 - 32 mmol/L 21  22  23    Calcium 8.9 - 10.3 mg/dL 8.0  8.8  8.5    CT Angio Chest PE W and/or Wo Contrast Result Date: 09/24/2024 CLINICAL DATA:  Known metastatic breast cancer. Concern for pulmonary embolism. Abdominal pain. EXAM: CT ANGIOGRAPHY CHEST CT ABDOMEN AND PELVIS WITH CONTRAST TECHNIQUE: Multidetector CT imaging of the chest was performed using the standard protocol during bolus administration of intravenous contrast. Multiplanar CT image reconstructions and MIPs were obtained to evaluate the vascular anatomy. Multidetector CT imaging of the abdomen and pelvis was performed using the standard protocol during bolus administration of intravenous contrast. RADIATION DOSE REDUCTION: This exam was performed according to the departmental dose-optimization program which includes automated exposure control, adjustment of the mA and/or kV according to patient size and/or use of iterative reconstruction technique. CONTRAST:  OMNIPAQUE  IOHEXOL  350 MG/ML SOLN COMPARISON:  CT dated 09/19/2024. FINDINGS: CTA CHEST FINDINGS Cardiovascular: There is no cardiomegaly or pericardial effusion. Right-sided Port-A-Cath with tip in the right atrium. The thoracic aorta is unremarkable. The origins of the great vessels of the aortic arch appear patent. No pulmonary artery embolus identified. Mediastinum/Nodes: Mildly enlarged bilateral hilar soft tissues, likely adenopathy. The esophagus is grossly unremarkable. Lungs/Pleura: Moderate right and small left pleural effusion, increased in size since the prior CT. Partial compressive atelectasis of the adjacent lungs. There is diffuse airspace ground-glass density which may represent combination of edema and atelectasis or superimposed pneumonia. Rounded right middle lobe mass measures 3.4 x 2.8 cm. Additional ovoid consolidation at the right lung base  similar to prior CT may represent rounded atelectasis. Overall interval increase in the diffuse subpleural and pulmonary nodularity which may represent edema, pneumonia, or progression of metastatic disease. No pneumothorax. The central airways are patent. Musculoskeletal: No acute osseous pathology. Degenerative changes spine. Similar sclerotic changes of T8 may be degenerative. Metastatic disease is not excluded. Progression of diffuse skin thickening and subcutaneous edema. Left axillary adenopathy measures up to 12 mm short axis. Right axillary surgical clips. Review of the MIP images confirms the above findings. CT ABDOMEN and PELVIS FINDINGS No intra-abdominal free air. Small ascites, slightly increased since the prior CT. Hepatobiliary: Multiple hepatic metastatic disease. The largest lesion in the right lobe measures 5.7 x 5.1 cm, similar or minimally increased in size when accounting for difference  in measurement technique. No biliary dilatation. Calcified gallstone. Pancreas: Unremarkable. No pancreatic ductal dilatation or surrounding inflammatory changes. Spleen: Normal in size without focal abnormality. Adrenals/Urinary Tract: The adrenal glands are unremarkable. There is no hydronephrosis on either side. There is symmetric enhancement and excretion of contrast by both kidneys. The urinary bladder is grossly unremarkable. Stomach/Bowel: There is sigmoid diverticulosis. Diffusely thickened colon likely related to ascites. Colitis is less likely but not excluded. No bowel obstruction. Appendectomy. Vascular/Lymphatic: The abdominal aorta and IVC unremarkable. No portal venous gas. No obvious adenopathy. Reproductive: The uterus is grossly unremarkable. No suspicious adnexal masses. Other: Diffuse subcutaneous edema progressed since the prior CT. Musculoskeletal: Osteopenia with degenerative changes. Similar appearance of L1 and L2 sclerotic changes. No acute osseous pathology. Review of the MIP images  confirms the above findings. IMPRESSION: 1. No CT evidence of pulmonary artery embolus. 2. Moderate right and small left pleural effusion, increased in size since the prior CT. 3. Diffuse airspace ground-glass density may represent combination of edema and atelectasis or superimposed pneumonia. 4. Overall interval increase in the diffuse subpleural and pulmonary nodularity which may represent edema, pneumonia, or progression of metastatic disease. 5. Multiple hepatic metastatic disease, similar or minimally increased in size since the prior CT. 6. Small ascites, slightly increased since the prior CT. 7. Diffusely thickened colon likely related to ascites. Colitis is less likely but not excluded. No bowel obstruction. 8. Cholelithiasis. 9. Sigmoid diverticulosis. Electronically Signed   By: Vanetta Chou M.D.   On: 09/24/2024 19:24   CT ABDOMEN PELVIS W CONTRAST Result Date: 09/24/2024 CLINICAL DATA:  Known metastatic breast cancer. Concern for pulmonary embolism. Abdominal pain. EXAM: CT ANGIOGRAPHY CHEST CT ABDOMEN AND PELVIS WITH CONTRAST TECHNIQUE: Multidetector CT imaging of the chest was performed using the standard protocol during bolus administration of intravenous contrast. Multiplanar CT image reconstructions and MIPs were obtained to evaluate the vascular anatomy. Multidetector CT imaging of the abdomen and pelvis was performed using the standard protocol during bolus administration of intravenous contrast. RADIATION DOSE REDUCTION: This exam was performed according to the departmental dose-optimization program which includes automated exposure control, adjustment of the mA and/or kV according to patient size and/or use of iterative reconstruction technique. CONTRAST:  OMNIPAQUE  IOHEXOL  350 MG/ML SOLN COMPARISON:  CT dated 09/19/2024. FINDINGS: CTA CHEST FINDINGS Cardiovascular: There is no cardiomegaly or pericardial effusion. Right-sided Port-A-Cath with tip in the right atrium. The thoracic  aorta is unremarkable. The origins of the great vessels of the aortic arch appear patent. No pulmonary artery embolus identified. Mediastinum/Nodes: Mildly enlarged bilateral hilar soft tissues, likely adenopathy. The esophagus is grossly unremarkable. Lungs/Pleura: Moderate right and small left pleural effusion, increased in size since the prior CT. Partial compressive atelectasis of the adjacent lungs. There is diffuse airspace ground-glass density which may represent combination of edema and atelectasis or superimposed pneumonia. Rounded right middle lobe mass measures 3.4 x 2.8 cm. Additional ovoid consolidation at the right lung base similar to prior CT may represent rounded atelectasis. Overall interval increase in the diffuse subpleural and pulmonary nodularity which may represent edema, pneumonia, or progression of metastatic disease. No pneumothorax. The central airways are patent. Musculoskeletal: No acute osseous pathology. Degenerative changes spine. Similar sclerotic changes of T8 may be degenerative. Metastatic disease is not excluded. Progression of diffuse skin thickening and subcutaneous edema. Left axillary adenopathy measures up to 12 mm short axis. Right axillary surgical clips. Review of the MIP images confirms the above findings. CT ABDOMEN and PELVIS FINDINGS No intra-abdominal  free air. Small ascites, slightly increased since the prior CT. Hepatobiliary: Multiple hepatic metastatic disease. The largest lesion in the right lobe measures 5.7 x 5.1 cm, similar or minimally increased in size when accounting for difference in measurement technique. No biliary dilatation. Calcified gallstone. Pancreas: Unremarkable. No pancreatic ductal dilatation or surrounding inflammatory changes. Spleen: Normal in size without focal abnormality. Adrenals/Urinary Tract: The adrenal glands are unremarkable. There is no hydronephrosis on either side. There is symmetric enhancement and excretion of contrast by both  kidneys. The urinary bladder is grossly unremarkable. Stomach/Bowel: There is sigmoid diverticulosis. Diffusely thickened colon likely related to ascites. Colitis is less likely but not excluded. No bowel obstruction. Appendectomy. Vascular/Lymphatic: The abdominal aorta and IVC unremarkable. No portal venous gas. No obvious adenopathy. Reproductive: The uterus is grossly unremarkable. No suspicious adnexal masses. Other: Diffuse subcutaneous edema progressed since the prior CT. Musculoskeletal: Osteopenia with degenerative changes. Similar appearance of L1 and L2 sclerotic changes. No acute osseous pathology. Review of the MIP images confirms the above findings. IMPRESSION: 1. No CT evidence of pulmonary artery embolus. 2. Moderate right and small left pleural effusion, increased in size since the prior CT. 3. Diffuse airspace ground-glass density may represent combination of edema and atelectasis or superimposed pneumonia. 4. Overall interval increase in the diffuse subpleural and pulmonary nodularity which may represent edema, pneumonia, or progression of metastatic disease. 5. Multiple hepatic metastatic disease, similar or minimally increased in size since the prior CT. 6. Small ascites, slightly increased since the prior CT. 7. Diffusely thickened colon likely related to ascites. Colitis is less likely but not excluded. No bowel obstruction. 8. Cholelithiasis. 9. Sigmoid diverticulosis. Electronically Signed   By: Vanetta Chou M.D.   On: 09/24/2024 19:24   DG Chest 2 View Result Date: 09/24/2024 CLINICAL DATA:  Cough. EXAM: CHEST - 2 VIEW COMPARISON:  Chest radiograph dated 09/18/2024. FINDINGS: Right-sided Port-A-Cath in similar position. Small right pleural effusion with associated right lung base atelectasis or infiltrate, increased since the prior radiograph. Right lower lung field rounded mass measures 3.5 cm. No pneumothorax. The cardiac silhouette is within normal limits. No acute osseous  pathology. IMPRESSION: 1. Small right pleural effusion with associated right lung base atelectasis or infiltrate, increased since the prior radiograph. 2. Right lower lung field rounded mass. Electronically Signed   By: Vanetta Chou M.D.   On: 09/24/2024 17:34    Family Communication: Discussed with patient, she understand and agree. All questions answered.  Disposition: Status is: Inpatient Remains inpatient appropriate because: Pain control, palliative discussion  Planned Discharge Destination: Home with hospice     Time spent: 46 minutes  Author: Concepcion Riser, MD 09/25/2024 2:57 PM Secure chat 7am to 7pm For on call review [email or web address].

## 2024-09-25 NOTE — Consult Note (Addendum)
 WOC Nurse Consult Note: Reason for Consult: Consult requested for right breast.  Performed remotely after review of progress notes and photos in the EMR.  Pt has breast cancer and it has advanced to patchy areas of full thickness skin loss and oozing yellow or bloody fluid at times.   Dressing changes will not promote healing, but are directed towards minimizing pain, bleeding and adherence with dressing changes.   Topical treatment orders provided for bedside nurses to perform as follows: Apply double-folded Xeroform gauze to right breast Q day, then cover with ABD pad and hold in place with mesh underwear with the crotch cut out, to form a tube top. Avoid use of tape and use NS to remove dressing if it is adhered.  Please re-consult if further assistance is needed.   Thank-you,  Stephane Fought MSN, RN, CWOCN, CWCN-AP, CNS Contact Mon-Fri 0700-1500: [phone]

## 2024-09-25 NOTE — Plan of Care (Signed)
  Problem: Pain Managment: Goal: General experience of comfort will improve and/or be controlled Outcome: Progressing   Problem: Safety: Goal: Ability to remain free from injury will improve Outcome: Progressing   Problem: Skin Integrity: Goal: Risk for impaired skin integrity will decrease Outcome: Progressing

## 2024-09-25 NOTE — ED Notes (Signed)
Provided patient with breakfast tray.

## 2024-09-25 NOTE — Consult Note (Signed)
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 ground-glass density may represent combination of edema and atelectasis or superimposed pneumonia. 4. Overall interval increase in the diffuse subpleural and pulmonary nodularity which may represent edema, pneumonia, or progression of metastatic disease. 5. Multiple hepatic metastatic disease, similar or minimally increased in size since the prior CT. 6. Small ascites, slightly increased since the prior CT. 7. Diffusely thickened colon likely related to ascites. Colitis is less likely but not excluded. No bowel obstruction. 8. Cholelithiasis. 9. Sigmoid diverticulosis.  Electronically Signed   By: Vanetta Chou M.D.   On: 09/24/2024 19:24 DG Chest 2 View CLINICAL DATA:  Cough.  EXAM: CHEST - 2 VIEW  COMPARISON:  Chest radiograph dated 09/18/2024.  FINDINGS: Right-sided Port-A-Cath in similar position. Small right pleural effusion  with associated right lung base atelectasis or infiltrate, increased since the prior radiograph. Right lower lung field rounded mass measures 3.5 cm. No pneumothorax. The cardiac silhouette is within normal limits. No acute osseous  pathology.  IMPRESSION: 1. Small right pleural effusion with associated right lung base atelectasis or infiltrate, increased since the prior radiograph. 2. Right lower lung field rounded mass.  Electronically Signed   By: Vanetta Chou M.D.   On: 09/24/2024 17:34    I personally reviewed recent imaging.   Palliative Care Assessment and Plan Summary of Established Goals of Care and Medical Treatment Preferences   Patient is 63 year old female with a past medical history of metastatic breast cancer with disease to lung and liver that has had cancer progression on multiple chemotherapy regimens and GERD who was admitted on 09/24/2024 for management of abdominal discomfort.  Since admission patient has received management for severe cough and possible pneumonia versus edema versus cancer progression.  Oncology consulted for recommendations.  Palliative medicine team consulted to assist with complex medical decision making  # Complex medical decision making/goals of care  - Extensive discussion with patient and daughter at bedside as detailed above in HPI.  Discussed patient's medical illness and pathways for medical care moving forward.  After discussion, patient and daughter electing for patient to go home with hospice support.  Attempting to coordinate home hospice equipment being set up after choice made.  Also need to determine if patient is appropriate for radiation prior to discharge home with hospice.  TOC consulted to help with coordination of care.  Palliative medicine team continuing to follow with patient's medical journey.  -  Code Status: Limited: Do not attempt resuscitation (DNR) -DNR-LIMITED -Do Not Intubate/DNI   # Symptom management Patient is  receiving these palliative interventions for symptom management with an intent to improve quality of life.   - Pain/cough/shortness of breath, in setting of metastatic breast cancer with disease to liver and lungs   - Start oral morphine  solution 10 mg every 2 hours as needed   - Change IV morphine  to 2-3 mg every 2 hours as needed breakthrough pain   - Have reached out to radiation oncology to determine if patient would be appropriate for fractions of radiation to help with breast pain/bleeding/oozing.  Awaiting input.  # Psycho-social/Spiritual Support:  - Support System: Daughter, April [name] - Desire for further Chaplain support:yes, complete HCPOA  # Discharge Planning:  Home with Hospice  Thank you for allowing the palliative care team to participate in the care April [name].  Tinnie Radar, DO Palliative Care Provider PMT # [phone]  If patient remains symptomatic despite maximum doses, please call PMT at [phone] between 0700 and 1900. Outside of these hours, please call attending, as PMT does not have night coverage.  Billing based on MDM: High  Problems Addressed: One or more chronic illnesses with severe exacerbation, progression, or side effects of treatment.  Risks: Parenteral controlled substances

## 2024-09-25 NOTE — TOC Initial Note (Signed)
 Transition of Care [facility]) - Initial/Assessment Note    Patient Details  Name: April [name] MRN: [id number] Date of Birth: January 05, 1961  Transition of Care [facility]) CM/SW Contact:    Toy [name] Agar, RN Phone Number:[phone]  09/25/2024, 3:10 PM  Clinical Narrative:                 Inpatient care manager acknowledges consult for hospice for patient going home with hospice. CM at bedside introduced self explained role and consult. CM  provided medicare.gov list for hospice choice. Patient is requesting [facility]. Referral has been sent to [facility] with [facility]. Inpatient care manager to follow.         Patient Goals and CMS Choice            Expected Discharge Plan and Services                                              Prior Living Arrangements/Services                       Activities of Daily Living   ADL Screening (condition at time of admission) Independently performs ADLs?: Yes (appropriate for developmental age) Is the patient deaf or have difficulty hearing?: No Does the patient have difficulty seeing, even when wearing glasses/contacts?: No Does the patient have difficulty concentrating, remembering, or making decisions?: No  Permission Sought/Granted                  Emotional Assessment              Admission diagnosis:  Pneumonia [J18.9] Tachycardia [R00.0] Lactic acid acidosis [E87.20] Pneumonia of right lower lobe due to infectious organism [J18.9] Patient Active Problem List   Diagnosis Date Noted   CAP (community acquired pneumonia) 09/24/2024   Pneumonia 09/24/2024   Dysplasia of cervix, high grade CIN 2 08/12/2024   Well woman exam with routine gynecological exam 06/24/2024   Breast cancer, stage 4 (HCC) 03/09/2022   Skin ulceration, limited to breakdown of skin (HCC) 12/07/2021   Malignant neoplasm metastatic to liver (HCC) 11/09/2021   Stomatitis 08/10/2020   Varicose veins of bilateral lower extremities  with other complications 07/27/2019   Dry skin dermatitis 07/27/2019   Appendicitis 11/28/2018   De Quervain's disease (tenosynovitis) 05/15/2018   Bilateral carpal tunnel syndrome 05/15/2018   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/11/2018   Osteoporosis 03/23/2017   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) 06/21/2016   Malignant neoplasm of upper-outer quadrant of right breast in female, estrogen receptor positive (HCC) 06/21/2016   Elevated blood pressure reading without diagnosis of hypertension 04/28/2016   Herpes genitalis--since 2005 per patient    Environmental and seasonal allergies 07/28/2012   Fibromyalgia    Anxiety 02/07/2012   Depression 01/04/2012   Headache-migranes 01/04/2012   PCP:  Billy Philippe JONELLE, NP Pharmacy:   [phone] - [place], [place] - [address] AT [facility] [address] [place] [address] Phone: [phone] Fax: [phone]     Social Drivers of Health (SDOH) Social History: SDOH Screenings   Food Insecurity: No Food Insecurity (09/25/2024)  Housing: Low Risk  (09/25/2024)  Transportation Needs: No Transportation Needs (09/25/2024)  Utilities: Not At Risk (09/25/2024)  Alcohol Screen: Low Risk  (02/12/2024)  Depression (PHQ2-9): Low Risk  (09/15/2024)  Recent Concern: Depression (PHQ2-9) - Medium Risk (06/17/2024)  Financial Resource Strain: Medium Risk (02/12/2024)  Physical Activity: Insufficiently Active (02/12/2024)  Social Connections: Socially Isolated (02/12/2024)  Stress: Stress Concern Present (02/12/2024)  Tobacco Use: Medium Risk (09/24/2024)   SDOH Interventions:     Readmission Risk Interventions     No data to display

## 2024-09-26 ENCOUNTER — Ambulatory Visit: Admitting: Radiation Oncology

## 2024-09-26 DIAGNOSIS — Z170 Estrogen receptor positive status [ER+]: Secondary | ICD-10-CM

## 2024-09-26 DIAGNOSIS — Z79899 Other long term (current) drug therapy: Secondary | ICD-10-CM | POA: Diagnosis not present

## 2024-09-26 DIAGNOSIS — Z510 Encounter for antineoplastic radiation therapy: Secondary | ICD-10-CM | POA: Insufficient documentation

## 2024-09-26 DIAGNOSIS — Z7189 Other specified counseling: Secondary | ICD-10-CM

## 2024-09-26 DIAGNOSIS — C50411 Malignant neoplasm of upper-outer quadrant of right female breast: Secondary | ICD-10-CM | POA: Insufficient documentation

## 2024-09-26 DIAGNOSIS — G893 Neoplasm related pain (acute) (chronic): Secondary | ICD-10-CM

## 2024-09-26 DIAGNOSIS — Z515 Encounter for palliative care: Secondary | ICD-10-CM | POA: Diagnosis not present

## 2024-09-26 DIAGNOSIS — Z66 Do not resuscitate: Secondary | ICD-10-CM | POA: Diagnosis not present

## 2024-09-26 LAB — RENAL FUNCTION PANEL
Albumin: 2.5 g/dL — ABNORMAL LOW (ref 3.5–5.0)
Anion gap: 8 (ref 5–15)
BUN: 7 mg/dL — ABNORMAL LOW (ref 8–23)
CO2: 24 mmol/L (ref 22–32)
Calcium: 8.5 mg/dL — ABNORMAL LOW (ref 8.9–10.3)
Chloride: 104 mmol/L (ref 98–111)
Creatinine, Ser: 0.51 mg/dL (ref 0.44–1.00)
GFR, Estimated: >60 mL/min (ref >60)
Glucose, Bld: 92 mg/dL (ref 70–99)
Phosphorus: 3.9 mg/dL (ref 2.5–4.6)
Potassium: 4 mmol/L (ref 3.5–5.1)
Sodium: 135 mmol/L (ref 135–145)

## 2024-09-26 LAB — CBC
HCT: 33.5 % — ABNORMAL LOW (ref 36.0–46.0)
Hemoglobin: 11.1 g/dL — ABNORMAL LOW (ref 12.0–15.0)
MCH: 37 pg — ABNORMAL HIGH (ref 26.0–34.0)
MCHC: 33.1 g/dL (ref 30.0–36.0)
MCV: 111.7 fL — ABNORMAL HIGH (ref 80.0–100.0)
Platelets: 125 K/uL — ABNORMAL LOW (ref 150–400)
RBC: 3 MIL/uL — ABNORMAL LOW (ref 3.87–5.11)
RDW: 16.6 % — ABNORMAL HIGH (ref 11.5–15.5)
WBC: 3.7 K/uL — ABNORMAL LOW (ref 4.0–10.5)
nRBC: 0 % (ref 0.0–0.2)

## 2024-09-26 MED ORDER — HEPARIN SOD (PORK) LOCK FLUSH 100 UNIT/ML IV SOLN
500.0000 [IU] | Freq: Once | INTRAVENOUS | Status: AC
  Administered 2024-09-26: 500 [IU] via INTRAVENOUS
  Filled 2024-09-26: qty 5

## 2024-09-26 MED ORDER — IBUPROFEN 200 MG PO TABS
400.0000 mg | ORAL_TABLET | Freq: Four times a day (QID) | ORAL | Status: DC | PRN
  Administered 2024-09-26: 400 mg via ORAL
  Filled 2024-09-26: qty 2

## 2024-09-26 MED ORDER — PROCHLORPERAZINE EDISYLATE 10 MG/2ML IJ SOLN
10.0000 mg | Freq: Four times a day (QID) | INTRAMUSCULAR | Status: DC | PRN
  Administered 2024-09-26: 10 mg via INTRAVENOUS
  Filled 2024-09-26: qty 2

## 2024-09-26 MED ORDER — MORPHINE SULFATE ER 15 MG PO TBCR: 15.0000 mg | EXTENDED_RELEASE_TABLET | Freq: Two times a day (BID) | ORAL | Status: DC

## 2024-09-26 NOTE — Progress Notes (Addendum)
 Radiation Oncology         [phone] ________________________________  Name: April [name]        MRN: [id number]  Date of Service: 09/24/2024 DOB: 23-Oct-1961  Inpatient Consultation  CC:Billy Philippe JONELLE, NP  No ref. provider found     REFERRING PHYSICIAN:  Darci Pore, MD    DIAGNOSIS:   1. Malignant neoplasm of upper-outer quadrant of right breast in female, estrogen receptor positive (HCC)  C50.411            HISTORY OF PRESENT ILLNESS: April [name] is a 63 y.o. female with medical history significant for metastatic breast cancer with disease to the liver and lung seen at the request of Dr. Clayton for symptomatic inflammatory breast cancer. She is well-known to Dr. Izell as she was previously treated with radiation to her right breast in 2018.   Patient has had disease progression on multiple chemotherapy regimens and was admitted on 09/24/2024 for management of abdominal discomfort. CT C/A/P on 09/24/2024 demonstrated progressive right and left pleural effusions; increase in diffuse subpleural and pulmonary nodularity; and multiple hepatic metastases, minimally increased in size. Patient was also noted to have bleeding/oozing from the right breast. She was seen by palliative care on 09/25/2024. After extensive discussion the patient had decided to go home with hospice.   We were kindly referred to today to discuss radiation treatment options.  Patient denies any right breast pain.  She notes an approximate 2-week history of bleeding and oozing from the breast that has been difficult to control.  She denies any odor.   PREVIOUS RADIATION THERAPY: Yes   Radiation treatment dates: 02/07/2017 - 03/21/2017 Patient received 60 Gy in 30 fractions to the right breast and surrounding lymph nodes under the care of Dr. Izell   PAST MEDICAL HISTORY:  Past Medical History:  Diagnosis Date   Allergy 07/28/2012   Seasonal/Environmental allergies   Anxiety 2013    Since 2013   Arthritis 2014 per patient    knees and shoulders   Bilateral ankle fractures 07/2015   Booted and resolved    Cancer [facility]) dx June 22, 2016   right breast   Depression 2013   Multiple  episodes  in past.   Elevated cholesterol 2017   Fibromyalgia 2013   diagnosed by Dr. Dolphus   Genital herpes 2005   Has outbreaks monthly if not on preventative medication   GERD (gastroesophageal reflux disease) 2013   History of radiation therapy 02/07/17- 03/21/17   Right Breast- 4 field 25 fractions. 50 Gy to SCLV/PAB in 25 fractions. Right Breast Boost 10 gy in 5 fractions.   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 [facility])    Metastatic adenocarcinoma to bone [facility])    Metastatic adenocarcinoma to liver [facility])    Migraine 2013   migraines   Neuromuscular disorder (HCC) 03/20/2017   neuropathy in fingers and toes from Chemo--intermittent   Obesity    Osteoporosis 03/23/2017   noted per bone density scan   Peripheral neuropathy 08/13/2017   Personal history of chemotherapy 11/2016   Personal history of radiation therapy    4/18   Right wrist fracture 06/2015   Resolved   Scoliosis of thoracic spine 01/04/2012   Skin condition 2012   patient reports periodic episodes of severe itching. She will itch and then blister at areas including her arms, back, and buttocks.    Urinary, incontinence, stress female 07/14/2016   patient reported  PAST SURGICAL HISTORY: Past Surgical History:  Procedure Laterality Date   AXILLARY LYMPH NODE DISSECTION Right 12/26/2016   Procedure: RIGHT AXILLARY LYMPH NODE DISSECTION;  Surgeon: Morene Olives, MD;  Location: [facility];  Service: General;  Laterality: Right;   BREAST LUMPECTOMY Right 2018   BREAST LUMPECTOMY WITH NEEDLE LOCALIZATION Right 12/19/2016   Procedure: RIGHT BREAST NEEDLE LOCALIZED LUMPECTOMY, RIGHT RADIOACTIVE SEED TARGETED AXILLARY SENTINEL LYMPH NODE BIOPSY;  Surgeon: Morene Olives, MD;   Location: [facility];  Service: General;  Laterality: Right;   IR ANGIOGRAM SELECTIVE EACH ADDITIONAL VESSEL  08/27/2023   IR ANGIOGRAM VISCERAL SELECTIVE  08/27/2023   IR EMBO ARTERIAL NOT HEMORR HEMANG INC GUIDE ROADMAPPING  08/17/2023   IR EMBO TUMOR ORGAN ISCHEMIA INFARCT INC GUIDE ROADMAPPING  08/27/2023   IR EMBO TUMOR ORGAN ISCHEMIA INFARCT INC GUIDE ROADMAPPING  10/08/2023   IR GENERIC HISTORICAL  10/09/2016   IR CV LINE INJECTION 10/09/2016 Marcey Moan, MD [facility]   IR IMAGING GUIDED PORT INSERTION  06/14/2022   IR RADIOLOGIST EVAL & MGMT  08/02/2023   IR THORACENTESIS ASP PLEURAL SPACE W/IMG GUIDE  09/18/2024   IR US  GUIDE VASC ACCESS RIGHT  08/27/2023   LAPAROSCOPIC APPENDECTOMY N/A 11/28/2018   Procedure: APPENDECTOMY LAPAROSCOPIC;  Surgeon: Signe Mitzie LABOR, MD;  Location: [facility];  Service: General;  Laterality: N/A;   PORT-A-CATH REMOVAL Left 12/19/2016   Procedure: REMOVAL PORT-A-CATH;  Surgeon: Morene Olives, MD;  Location: [facility];  Service: General;  Laterality: Left;   PORTA CATH INSERTION  2021   PORTACATH PLACEMENT N/A 07/11/2016   Procedure: INSERTION PORT-A-CATH;  Surgeon: Morene Olives, MD;  Location: [facility];  Service: General;  Laterality: N/A;   RADIOACTIVE SEED GUIDED AXILLARY SENTINEL LYMPH NODE Right 12/19/2016   Procedure: RADIOACTIVE SEED GUIDED AXILLARY SENTINEL LYMPH NODE BIOPSY;  Surgeon: Morene Olives, MD;  Location: [facility];  Service: General;  Laterality: Right;   WISDOM TOOTH EXTRACTION  yrs ago     FAMILY HISTORY:  Family History  Problem Relation Age of Onset   Arthritis Mother    Hypertension Mother    Heart disease Mother    Dementia Mother    Irritable bowel syndrome Mother    Emphysema Father    Cancer Father        bladder   Cerebral aneurysm Father        ruptured aneurysm was cause of death   Graves' disease Sister    Vitiligo Sister    Mental illness Sister         likely undiagnosed schizophrenia   Hyperlipidemia Brother    Mental illness Brother        Depression   Mental illness Brother        Schizophrenia   ADD / ADHD Daughter    Depression Daughter      SOCIAL HISTORY:  reports that she quit smoking about 30 years ago. Her smoking use included cigarettes. She started smoking about 45 years ago. She has a 7.5 pack-year smoking history. She has quit using smokeless tobacco. She reports current alcohol use of about 2.0 - 4.0 standard drinks of alcohol per week. She reports that she does not currently use drugs after having used the following drugs: Marijuana.   ALLERGIES: Datopotamab deruxtecan, Eribulin  mesylate, Codeine , Cymbalta  [duloxetine  hcl], Hydrocodone , Ultram  [tramadol  hcl], and Venlafaxine    MEDICATIONS:  Current Facility-Administered Medications  Medication Dose Route Frequency Provider Last Rate Last Admin  acetaminophen  (TYLENOL ) tablet 650 mg  650 mg Oral Q6H PRN Arthea Child, MD       Or   acetaminophen  (TYLENOL ) suppository 650 mg  650 mg Rectal Q6H PRN Arthea Child, MD       bisacodyl  (DULCOLAX) EC tablet 5 mg  5 mg Oral Daily PRN Arthea Child, MD       Chlorhexidine  Gluconate Cloth 2 % PADS 6 each  6 each Topical Daily Sreeram, Narendranath, MD   6 each at 09/26/24 0857   famotidine  (PEPCID ) tablet 20 mg  20 mg Oral BID Claiborne, Claudia, MD   20 mg at 09/26/24 0857   feeding supplement (ENSURE PLUS HIGH PROTEIN) liquid 237 mL  237 mL Oral BID BM Sreeram, Narendranath, MD   237 mL at 09/26/24 0857   ibuprofen  (ADVIL ) tablet 400 mg  400 mg Oral Q6H PRN Sreeram, Narendranath, MD   400 mg at 09/26/24 0810   morphine  (MS CONTIN ) 12 hr tablet 15 mg  15 mg Oral Q12H Mims, Lauren W, DO       morphine  (PF) 2 MG/ML injection 2-3 mg  2-3 mg Intravenous Q2H PRN Clayton Tinnie ORN, DO   3 mg at 09/26/24 0243   morphine  CONCENTRATE 10 mg / 0.5 ml oral solution 10 mg  10 mg Oral Q2H PRN Mims, Lauren W, DO   10 mg at  09/26/24 0030   ondansetron  (ZOFRAN ) tablet 4 mg  4 mg Oral Q6H PRN Arthea Child, MD       Or   ondansetron  (ZOFRAN ) injection 4 mg  4 mg Intravenous Q6H PRN Claiborne, Claudia, MD   4 mg at 09/26/24 0857   prochlorperazine  (COMPAZINE ) injection 10 mg  10 mg Intravenous Q6H PRN Darci Pore, MD   10 mg at 09/26/24 1028   sodium chloride  flush (NS) 0.9 % injection 10-40 mL  10-40 mL Intracatheter Q12H Sreeram, Pore, MD   10 mL at 09/26/24 0857   sodium chloride  flush (NS) 0.9 % injection 10-40 mL  10-40 mL Intracatheter PRN Darci Pore, MD         REVIEW OF SYSTEMS: Notable for that above.     PHYSICAL EXAM:  Wt Readings from Last 3 Encounters:  09/25/24 188 lb 4.4 oz (85.4 kg)  09/23/24 184 lb 3.2 oz (83.6 kg)  09/18/24 181 lb (82.1 kg)   Temp Readings from Last 3 Encounters:  09/26/24 97.8 F (36.6 C) (Oral)  09/23/24 98.2 F (36.8 C) (Temporal)  09/18/24 98.4 F (36.9 C) (Oral)   BP Readings from Last 3 Encounters:  09/26/24 114/69  09/23/24 117/70  09/18/24 122/62   Pulse Readings from Last 3 Encounters:  09/26/24 (!) 108  09/23/24 (!) 106  09/18/24 (!) 120   Pain Assessment Pain Score: 0-No pain/10  In general this is a well appearing female in no acute distress. She's alert and oriented x4 and appropriate throughout the examination. Cardiopulmonary assessment is negative for acute distress and she exhibits normal effort.   Right breast: diffuse skin thickening with subcutaneous nodules and blistering. Multiple skin lesion have opened and are associated with bleeding and white discharge. Limited ROM in her right shoulder due to severe right arm lymphedema.       ECOG = 3-4  0 - Asymptomatic (Fully active, able to carry on all predisease activities without restriction)  1 - Symptomatic but completely ambulatory (Restricted in physically strenuous activity but ambulatory and able to carry out work of a light or sedentary nature.  For example, light housework, office work)  2 - Symptomatic, <50% in bed during the day (Ambulatory and capable of all self care but unable to carry out any work activities. Up and about more than 50% of waking hours)  3 - Symptomatic, >50% in bed, but not bedbound (Capable of only limited self-care, confined to bed or chair 50% or more of waking hours)  4 - Bedbound (Completely disabled. Cannot carry on any self-care. Totally confined to bed or chair)  5 - Death   Raylene MM, Creech RH, Tormey DC, et al. [phone]). Toxicity and response criteria of the [facility]. Am. DOROTHA Bridges. Oncol. 5 (6): 649-55    LABORATORY DATA:  Lab Results  Component Value Date   WBC 3.7 (L) 09/26/2024   HGB 11.1 (L) 09/26/2024   HCT 33.5 (L) 09/26/2024   MCV 111.7 (H) 09/26/2024   PLT 125 (L) 09/26/2024   Lab Results  Component Value Date   NA 135 09/26/2024   K 4.0 09/26/2024   CL 104 09/26/2024   CO2 24 09/26/2024   Lab Results  Component Value Date   ALT 41 09/25/2024   AST 108 (H) 09/25/2024   GGT 18 01/08/2018   ALKPHOS 443 (H) 09/25/2024   BILITOT 2.7 (H) 09/25/2024      RADIOGRAPHY: CT Angio Chest PE W and/or Wo Contrast Result Date: 09/24/2024 CLINICAL DATA:  Known metastatic breast cancer. Concern for pulmonary embolism. Abdominal pain. EXAM: CT ANGIOGRAPHY CHEST CT ABDOMEN AND PELVIS WITH CONTRAST TECHNIQUE: Multidetector CT imaging of the chest was performed using the standard protocol during bolus administration of intravenous contrast. Multiplanar CT image reconstructions and MIPs were obtained to evaluate the vascular anatomy. Multidetector CT imaging of the abdomen and pelvis was performed using the standard protocol during bolus administration of intravenous contrast. RADIATION DOSE REDUCTION: This exam was performed according to the departmental dose-optimization program which includes automated exposure control, adjustment of the mA and/or kV according to  patient size and/or use of iterative reconstruction technique. CONTRAST:  OMNIPAQUE  IOHEXOL  350 MG/ML SOLN COMPARISON:  CT dated 09/19/2024. FINDINGS: CTA CHEST FINDINGS Cardiovascular: There is no cardiomegaly or pericardial effusion. Right-sided Port-A-Cath with tip in the right atrium. The thoracic aorta is unremarkable. The origins of the great vessels of the aortic arch appear patent. No pulmonary artery embolus identified. Mediastinum/Nodes: Mildly enlarged bilateral hilar soft tissues, likely adenopathy. The esophagus is grossly unremarkable. Lungs/Pleura: Moderate right and small left pleural effusion, increased in size since the prior CT. Partial compressive atelectasis of the adjacent lungs. There is diffuse airspace ground-glass density which may represent combination of edema and atelectasis or superimposed pneumonia. Rounded right middle lobe mass measures 3.4 x 2.8 cm. Additional ovoid consolidation at the right lung base similar to prior CT may represent rounded atelectasis. Overall interval increase in the diffuse subpleural and pulmonary nodularity which may represent edema, pneumonia, or progression of metastatic disease. No pneumothorax. The central airways are patent. Musculoskeletal: No acute osseous pathology. Degenerative changes spine. Similar sclerotic changes of T8 may be degenerative. Metastatic disease is not excluded. Progression of diffuse skin thickening and subcutaneous edema. Left axillary adenopathy measures up to 12 mm short axis. Right axillary surgical clips. Review of the MIP images confirms the above findings. CT ABDOMEN and PELVIS FINDINGS No intra-abdominal free air. Small ascites, slightly increased since the prior CT. Hepatobiliary: Multiple hepatic metastatic disease. The largest lesion in the right lobe measures 5.7 x 5.1 cm, similar or minimally increased in  size when accounting for difference in measurement technique. No biliary dilatation. Calcified gallstone.  Pancreas: Unremarkable. No pancreatic ductal dilatation or surrounding inflammatory changes. Spleen: Normal in size without focal abnormality. Adrenals/Urinary Tract: The adrenal glands are unremarkable. There is no hydronephrosis on either side. There is symmetric enhancement and excretion of contrast by both kidneys. The urinary bladder is grossly unremarkable. Stomach/Bowel: There is sigmoid diverticulosis. Diffusely thickened colon likely related to ascites. Colitis is less likely but not excluded. No bowel obstruction. Appendectomy. Vascular/Lymphatic: The abdominal aorta and IVC unremarkable. No portal venous gas. No obvious adenopathy. Reproductive: The uterus is grossly unremarkable. No suspicious adnexal masses. Other: Diffuse subcutaneous edema progressed since the prior CT. Musculoskeletal: Osteopenia with degenerative changes. Similar appearance of L1 and L2 sclerotic changes. No acute osseous pathology. Review of the MIP images confirms the above findings. IMPRESSION: 1. No CT evidence of pulmonary artery embolus. 2. Moderate right and small left pleural effusion, increased in size since the prior CT. 3. Diffuse airspace ground-glass density may represent combination of edema and atelectasis or superimposed pneumonia. 4. Overall interval increase in the diffuse subpleural and pulmonary nodularity which may represent edema, pneumonia, or progression of metastatic disease. 5. Multiple hepatic metastatic disease, similar or minimally increased in size since the prior CT. 6. Small ascites, slightly increased since the prior CT. 7. Diffusely thickened colon likely related to ascites. Colitis is less likely but not excluded. No bowel obstruction. 8. Cholelithiasis. 9. Sigmoid diverticulosis. Electronically Signed   By: Vanetta Chou M.D.   On: 09/24/2024 19:24   CT ABDOMEN PELVIS W CONTRAST Result Date: 09/24/2024 CLINICAL DATA:  Known metastatic breast cancer. Concern for pulmonary embolism. Abdominal  pain. EXAM: CT ANGIOGRAPHY CHEST CT ABDOMEN AND PELVIS WITH CONTRAST TECHNIQUE: Multidetector CT imaging of the chest was performed using the standard protocol during bolus administration of intravenous contrast. Multiplanar CT image reconstructions and MIPs were obtained to evaluate the vascular anatomy. Multidetector CT imaging of the abdomen and pelvis was performed using the standard protocol during bolus administration of intravenous contrast. RADIATION DOSE REDUCTION: This exam was performed according to the departmental dose-optimization program which includes automated exposure control, adjustment of the mA and/or kV according to patient size and/or use of iterative reconstruction technique. CONTRAST:  OMNIPAQUE  IOHEXOL  350 MG/ML SOLN COMPARISON:  CT dated 09/19/2024. FINDINGS: CTA CHEST FINDINGS Cardiovascular: There is no cardiomegaly or pericardial effusion. Right-sided Port-A-Cath with tip in the right atrium. The thoracic aorta is unremarkable. The origins of the great vessels of the aortic arch appear patent. No pulmonary artery embolus identified. Mediastinum/Nodes: Mildly enlarged bilateral hilar soft tissues, likely adenopathy. The esophagus is grossly unremarkable. Lungs/Pleura: Moderate right and small left pleural effusion, increased in size since the prior CT. Partial compressive atelectasis of the adjacent lungs. There is diffuse airspace ground-glass density which may represent combination of edema and atelectasis or superimposed pneumonia. Rounded right middle lobe mass measures 3.4 x 2.8 cm. Additional ovoid consolidation at the right lung base similar to prior CT may represent rounded atelectasis. Overall interval increase in the diffuse subpleural and pulmonary nodularity which may represent edema, pneumonia, or progression of metastatic disease. No pneumothorax. The central airways are patent. Musculoskeletal: No acute osseous pathology. Degenerative changes spine. Similar sclerotic  changes of T8 may be degenerative. Metastatic disease is not excluded. Progression of diffuse skin thickening and subcutaneous edema. Left axillary adenopathy measures up to 12 mm short axis. Right axillary surgical clips. Review of the MIP images confirms the above findings. CT  ABDOMEN and PELVIS FINDINGS No intra-abdominal free air. Small ascites, slightly increased since the prior CT. Hepatobiliary: Multiple hepatic metastatic disease. The largest lesion in the right lobe measures 5.7 x 5.1 cm, similar or minimally increased in size when accounting for difference in measurement technique. No biliary dilatation. Calcified gallstone. Pancreas: Unremarkable. No pancreatic ductal dilatation or surrounding inflammatory changes. Spleen: Normal in size without focal abnormality. Adrenals/Urinary Tract: The adrenal glands are unremarkable. There is no hydronephrosis on either side. There is symmetric enhancement and excretion of contrast by both kidneys. The urinary bladder is grossly unremarkable. Stomach/Bowel: There is sigmoid diverticulosis. Diffusely thickened colon likely related to ascites. Colitis is less likely but not excluded. No bowel obstruction. Appendectomy. Vascular/Lymphatic: The abdominal aorta and IVC unremarkable. No portal venous gas. No obvious adenopathy. Reproductive: The uterus is grossly unremarkable. No suspicious adnexal masses. Other: Diffuse subcutaneous edema progressed since the prior CT. Musculoskeletal: Osteopenia with degenerative changes. Similar appearance of L1 and L2 sclerotic changes. No acute osseous pathology. Review of the MIP images confirms the above findings. IMPRESSION: 1. No CT evidence of pulmonary artery embolus. 2. Moderate right and small left pleural effusion, increased in size since the prior CT. 3. Diffuse airspace ground-glass density may represent combination of edema and atelectasis or superimposed pneumonia. 4. Overall interval increase in the diffuse subpleural  and pulmonary nodularity which may represent edema, pneumonia, or progression of metastatic disease. 5. Multiple hepatic metastatic disease, similar or minimally increased in size since the prior CT. 6. Small ascites, slightly increased since the prior CT. 7. Diffusely thickened colon likely related to ascites. Colitis is less likely but not excluded. No bowel obstruction. 8. Cholelithiasis. 9. Sigmoid diverticulosis. Electronically Signed   By: Vanetta Chou M.D.   On: 09/24/2024 19:24   DG Chest 2 View Result Date: 09/24/2024 CLINICAL DATA:  Cough. EXAM: CHEST - 2 VIEW COMPARISON:  Chest radiograph dated 09/18/2024. FINDINGS: Right-sided Port-A-Cath in similar position. Small right pleural effusion with associated right lung base atelectasis or infiltrate, increased since the prior radiograph. Right lower lung field rounded mass measures 3.5 cm. No pneumothorax. The cardiac silhouette is within normal limits. No acute osseous pathology. IMPRESSION: 1. Small right pleural effusion with associated right lung base atelectasis or infiltrate, increased since the prior radiograph. 2. Right lower lung field rounded mass. Electronically Signed   By: Vanetta Chou M.D.   On: 09/24/2024 17:34   CT CHEST ABDOMEN PELVIS W CONTRAST Result Date: 09/19/2024 CLINICAL DATA:  Right-sided breast cancer. Restaging. Metastatic disease evaluation. EXAM: CT CHEST, ABDOMEN, AND PELVIS WITH CONTRAST TECHNIQUE: Multidetector CT imaging of the chest, abdomen and pelvis was performed following the standard protocol during bolus administration of intravenous contrast. RADIATION DOSE REDUCTION: This exam was performed according to the departmental dose-optimization program which includes automated exposure control, adjustment of the mA and/or kV according to patient size and/or use of iterative reconstruction technique. CONTRAST:  OMNIPAQUE  IOHEXOL  300 MG/ML  SOLN COMPARISON:  07/15/2024 FINDINGS: CT CHEST FINDINGS  Cardiovascular: The heart size is normal. No substantial pericardial effusion. Right Port-A-Cath remains in place. Mediastinum/Nodes: Slight increase in 10 mm short axis prevascular lymph node. There is no hilar lymphadenopathy. The esophagus has normal imaging features. Mild left axillary lymphadenopathy similar to prior. Lungs/Pleura: Anterior right lung nodule measuring 4.0 x 3.1 cm on image 70/3 today was 2.0 x 1.8 cm previously, remeasured. 11 mm lingular nodule on 78/3 is new in the interval. 15 mm paraspinal right lower lobe nodule on 91/3 was  11 mm previously, remeasured. Similar right pleural effusion with underlying pleural thickening. Tiny left pleural effusion is new in the interval. Musculoskeletal: No worrisome lytic or sclerotic osseous abnormality. Interval progression of skin thickening in the left breast with progressive body wall edema in the thorax diffusely. CT ABDOMEN PELVIS FINDINGS Hepatobiliary: Dominant metastasis in the medial segment left liver measures 5.4 x 5.0 cm today increased from 4.0 x 3.8 cm when remeasured in a similar fashion on the previous study. Progressive cluster of liver metastases towards the hepatic dome. Adjacent metastatic lesions in the lateral segment left liver seen on image 54/2 measure up to 13 mm and are new in the interval. Multiple calcified gallstones evident. No intrahepatic or extrahepatic biliary dilation. Pancreas: No focal mass lesion. No dilatation of the main duct. No intraparenchymal cyst. No peripancreatic edema. Spleen: No splenomegaly. No suspicious focal mass lesion. Adrenals/Urinary Tract: No adrenal nodule or mass. Kidneys unremarkable. No evidence for hydroureter. Bladder is decompressed. Stomach/Bowel: Stomach is unremarkable. No gastric wall thickening. No evidence of outlet obstruction. Duodenum is normally positioned as is the ligament of Treitz. No small bowel wall thickening. No small bowel dilatation. The terminal ileum is normal. Colon  is diffusely decompressed which accentuates wall thickness. Diverticular changes are noted in the left colon without evidence of diverticulitis. Vascular/Lymphatic: No abdominal aortic aneurysm. No abdominal lymphadenopathy. No pelvic sidewall lymphadenopathy. Reproductive: Gas is identified in the vagina and in the region of the external cervical os. Nonspecific. There is no adnexal mass. Other: Interval progression of free fluid around the liver and spleen. Small to moderate volume free fluid in the pelvis has also progressed in the interval with fluid seen in both para colic gutters. Musculoskeletal: Mixed lytic and sclerotic lesion in the L2 vertebral body is similar to prior. Stable metastatic involvement of L1 and T8 as described previously. Sclerosis in the right ischial tuberosity is stable consistent with metastatic involvement. There is diffuse body wall edema. IMPRESSION: 1. Interval progression of pulmonary and hepatic metastases. 2. Similar mild left axillary lymphadenopathy. 3. Similar osseous metastatic disease. 4. Interval progression of free fluid in the abdomen and pelvis. 5. Interval progression of skin thickening in the left breast with progressive body wall edema in the thorax and abdomen. 6. Cholelithiasis. 7. Gas in the vagina and in the region of the external cervical os. Nonspecific. 8. Colonic diverticulosis without diverticulitis. Electronically Signed   By: Camellia Candle M.D.   On: 09/19/2024 09:00   IR ABDOMEN US  LIMITED Result Date: 09/18/2024 CLINICAL DATA:  Previous right-sided thoracentesis, but persistent shortness of breath. EXAM: LIMITED ABDOMEN ULTRASOUND FOR ASCITES TECHNIQUE: Limited ultrasound survey for ascites was performed in all four abdominal quadrants. COMPARISON:  None Available. FINDINGS: All 4 quadrants of the abdomen were evaluated with ultrasound. No significant fluid collection. IMPRESSION: No significant ascites. Electronically Signed   By: Cordella Banner    On: 09/18/2024 14:56   IR THORACENTESIS ASP PLEURAL SPACE W/IMG GUIDE Result Date: 09/18/2024 INDICATION: Right-sided pleural effusion in a patient with shortness of breath and nonproductive cough EXAM: ULTRASOUND GUIDED right THORACENTESIS MEDICATIONS: None. COMPLICATIONS: None immediate. PROCEDURE: An ultrasound guided thoracentesis was thoroughly discussed with the patient and questions answered. The benefits, risks, alternatives and complications were also discussed. The patient understands and wishes to proceed with the procedure. Written consent was obtained. Ultrasound was performed to localize and mark an adequate pocket of fluid in the right chest. The area was then prepped and draped in the normal sterile fashion. 1%  Lidocaine  was used for local anesthesia. Under ultrasound guidance a 19 gauge 7 cm Yueh catheter was introduced. Thoracentesis was performed. The catheter was removed and a dressing applied. FINDINGS: A total of approximately 800 mL of dark yellow fluid was removed. Samples were sent to the laboratory as requested by the clinical team. IMPRESSION: Successful ultrasound guided right thoracentesis yielding 800 mL of pleural fluid. Electronically Signed   By: Cordella Banner   On: 09/18/2024 14:55   DG Chest Port 1 View Result Date: 09/18/2024 CLINICAL DATA:  Status post right thoracentesis. EXAM: PORTABLE CHEST 1 VIEW COMPARISON:  09/15/2024. Chest, abdomen and pelvis CT dated 07/15/2024. FINDINGS: Normal-sized heart. Stable right jugular porta catheter with its tip in the right atrium, proximally 2.3 cm inferior to the superior cavoatrial junction. Grossly stable right mid to lower lung zone mass. No pneumothorax following right thoracentesis. Small amount of residual pleural fluid on the right. Right breast calcified fat necrosis. Unremarkable bones. IMPRESSION: 1. No pneumothorax following right thoracentesis. 2. Small amount of residual right pleural fluid. 3. Grossly stable right  mid to lower lung zone mass. Electronically Signed   By: Elspeth Bathe M.D.   On: 09/18/2024 14:43   US  THORACENTESIS ASP PLEURAL SPACE W/IMG GUIDE Result Date: 09/15/2024 INDICATION: History of breast cancer, found to have right pleural effusion on outpatient chest x-ray. Consult for diagnostic and therapeutic right thoracentesis. EXAM: ULTRASOUND GUIDED DIAGNOSTIC AND THERAPEUTIC RIGHT THORACENTESIS MEDICATIONS: 8 mL 1% lidocaine  COMPLICATIONS: None immediate. PROCEDURE: An ultrasound guided thoracentesis was thoroughly discussed with the patient and questions answered. The benefits, risks, alternatives and complications were also discussed. The patient understands and wishes to proceed with the procedure. Written consent was obtained. Ultrasound was performed to localize and mark an adequate pocket of fluid in the right chest. The area was then prepped and draped in the normal sterile fashion. 1% Lidocaine  was used for local anesthesia. Under ultrasound guidance a 6 Fr Safe-T-Centesis catheter was introduced. Thoracentesis was performed. The catheter was removed and a dressing applied. FINDINGS: A total of approximately 850 mL of hazy yellow fluid was removed. Samples were sent to the laboratory as requested by the clinical team. IMPRESSION: Successful ultrasound guided right thoracentesis yielding 850 mL of pleural fluid. Performed and dictated by Kimble Clas, PA-C Electronically Signed   By: Marcey Moan M.D.   On: 09/15/2024 16:50   DG Chest Port 1 View Result Date: 09/15/2024 CLINICAL DATA:  Right thoracentesis. EXAM: PORTABLE CHEST 1 VIEW COMPARISON:  09/15/2024 and CT chest 07/15/2024. FINDINGS: Trachea is midline. Right IJ power port tip is in the right atrium. Heart size normal. Small to moderate residual loculated right pleural effusion with rounded consolidation in the lateral right midlung zone. No pneumothorax. Left lung is grossly clear. No pleural fluid. Surgical clips in the right  axilla with a right mastectomy. IMPRESSION: 1. No pneumothorax status post right thoracentesis. Small to moderate residual loculated right pleural effusion. 2. Rounded consolidation in the lateral right midlung zone, better evaluated on CT chest 07/15/2024. Electronically Signed   By: Newell Eke M.D.   On: 09/15/2024 15:56   DG Chest 2 View Result Date: 09/15/2024 CLINICAL DATA:  Cough and shortness of breath. EXAM: CHEST - 2 VIEW COMPARISON:  Chest x-ray 08/29/2023.  Chest CT 07/15/2024. FINDINGS: Interval development of consolidative opacity in the right mid and lower lung with small right pleural effusion. Similar changes are seen on CT chest 07/15/2024. round calcification overlying the right lower lung seen to be  in the right breast on previous CT. Interstitial prominence noted left lung without dense focal airspace consolidation. Subtle irregular nodular opacity noted left apex similar to previous CT. The cardiopericardial silhouette is within normal limits for size. Right Port-A-Cath again noted. IMPRESSION: 1. Consolidative opacity in the right mid and lower lung with small right pleural effusion. Similar changes are seen on CT chest 07/15/2024. 2. Subtle irregular nodular opacity left apex. Irregular left upper lobe nodules are seen on previous CT. Electronically Signed   By: Camellia Candle M.D.   On: 09/15/2024 10:43       IMPRESSION/PLAN: 54. 63 year old female with metastatic breast cancer   We reviewed this patient's current work-up. 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 Her pain is fortunately well controlled. Patient has made the decision to be discharged with hospice. Dr. Izell is recommending a single fraction of palliative radiation in efforts to control her symptoms.   We discussed the natural history of inflammatory breast cancer and general treatment, highlighting the role of radiotherapy in the  management. We reviewed the anticipated acute and late sequelae associated with radiation in this setting.  The patient was encouraged to ask questions that I answered to the best of my ability. The patient would like to proceed with radiation A patient consent form was discussed and signed.   Patient is scheduled for CT simulation later this afternoon. Dr. Izell anticipates 1 fraction of radiation directed at the right breast. We look forward to participating in this patient's care.    In a visit lasting 60 minutes, greater than 50% of the time was spent face to face discussing the patient's condition, in preparation for the discussion, and coordinating the patient's care.   The above documentation reflects my direct findings during this shared patient visit. Please see the separate note by Dr. Izell on this date for the remainder of the patient's plan of care.    Leeroy Due, PA-C    **Disclaimer: This note was dictated with voice recognition software. Similar sounding words can inadvertently be transcribed and this note may contain transcription errors which may not have been corrected upon publication of note.THORA LILLETTE Lauraine Izell, MD, hereby attest that the information provided in this document is true and accurate to the best of my knowledge. I performed in the medical decision-making and I agree with the documentation and plan of care above.  -----------------------------------  Lauraine Izell, MD

## 2024-09-26 NOTE — Progress Notes (Signed)
 Daily Progress Note   Patient Name: April [name]       Date: 09/26/2024 DOB: May 29, 1961  Age: 63 y.o. MRN#: [id number] Attending Physician: Darci Pore, MD Primary Care Physician: Billy Philippe JONELLE, NP Admit Date: 09/24/2024 Length of Stay: 2 days  Reason for Consultation/Follow-up: Establishing goals of care  Subjective:   CC: Patient feeling tired today.  Following up regarding complex medical decision making.  Subjective:  Reviewed EMR including recent documentation from hospitalist and radiation oncologist.  At time of EMR review on past 24 hours patient has received as needed IV morphine  2 mg x 1 dose and 3 mg x 1 dose.  Patient has also received as needed oral morphine  solution 10 mg x 1 dose. Chaplain was able to assist with completion of HCPOA documentation. Discussed care with hospitalist, radiation oncologist, TOC, bedside RN, and [facility] liaison throughout the day to coordinate care.  Presented to bedside to see patient.  Patient resting comfortably with daughter at bedside.  Patient noted she was feeling tired today and is looking forward to going home soon.  Discussed planning moving forward.  Plan is for patient to have simulation for radiation today.  Patient will follow-up in the outpatient setting on Monday for 1 fraction of radiation to help with symptom burden.  Patient still planning to pursue hospice support at home.  Hospice assisting with patient obtaining DME for care at home.  Hospice will admit patient on Monday after she has received her radiation therapy.  Patient to be discharged today with medications for symptom management until hospice can admit patient on Monday.  Discussed patient's symptom burden at this time.  Patient does feel the morphine  helps with her pain.  Discussed based on patient's OME requirements, can start long-acting morphine  to help more consistently with pain.  Patient agreement with this plan.  Patient to start MS Contin  this  evening.  Have reviewed importance of regular bowel movements while taking opioids.  All questions answered at that time.  Noted palliative medicine team to continue following with patient's medical journey.  Objective:   Vital Signs:  BP 113/78 (BP Location: Left Arm)   Pulse (!) 107   Temp 98.6 F (37 C) (Oral)   Resp 18   Ht 5' 3 (1.6 m)   Wt 85.4 kg   SpO2 94%   BMI 33.35 kg/m   Physical Exam: General: NAD, alert, pleasant, chronically ill-appearing Cardiovascular: RRR, edema in right upper extremity Respiratory: no increased work of breathing noted, not in respiratory distress Abdomen: not distended Skin: Reviewed EMR pictures of right breast wound Neuro: A&Ox4, following commands easily Psych: appropriately answers all questions  Assessment & Plan:   Assessment: Patient is 63 year old female with a past medical history of metastatic breast cancer with disease to lung and liver that has had cancer progression on multiple chemotherapy regimens and GERD who was admitted on 09/24/2024 for management of abdominal discomfort.  Since admission patient has received management for severe cough and possible pneumonia versus edema versus cancer progression.  Oncology consulted for recommendations.  Palliative medicine team consulted to assist with complex medical decision making   Recommendations/Plan: # Complex medical decision making/goals of care:     - Discussed care with patient and daughter at bedside today as detailed above in HPI.  Patient going to receive 1 fraction of radiation in the outpatient setting on Monday, 09/29/2024.  Patient to be admitted to [facility] hospice after radiation on Monday.  [facility] assisting with coordination  of DME equipment.  Patient to be discharged with medications to manage symptoms until hospice able to admit patient Monday.  Palliative medicine team continue to follow along with patient's medical journey.                -  Code Status: Limited: Do  not attempt resuscitation (DNR) -DNR-LIMITED -Do Not Intubate/DNI     - Signed gold DNR form for patient to take home  # Symptom management Patient is receiving these palliative interventions for symptom management with an intent to improve quality of life.                 - Pain/cough/shortness of breath, in setting of metastatic breast cancer with disease to liver and lungs    -Start MS Contin  15mg  q 12 hours                                - Continue oral morphine  solution 10 mg every 2 hours as needed                               - Continue IV morphine  to 2-3 mg every 2 hours as needed breakthrough pain   # Psycho-social/Spiritual Support:  - Support System: Daughter, Lacinda - Desire for further Chaplain support:yes, completed HCPOA   # Discharge Planning:  Home with Hospice  Discussed with: Patient, patient's daughter, hospitalist, TOC, [facility] liaison, radiation oncologist  Thank you for allowing the palliative care team to participate in the care [name] Clubs.  Tinnie Radar, DO Palliative Care Provider PMT # [phone]  If patient remains symptomatic despite maximum doses, please call PMT at [phone] between 0700 and 1900. Outside of these hours, please call attending, as PMT does not have night coverage.

## 2024-09-26 NOTE — Progress Notes (Signed)
 Chaplains received a spiritual care consult to assist Clarivel and her daughter with [facility] paperwork.  She wishes to assign her daughter as primary and her niece as secondary.  Paperwork and education were provided. They plan to fill out paperwork and we will attempt to get document notarized tomorrow.  Please page when documents are completed and ready to be notarized.  [phone].

## 2024-09-26 NOTE — Discharge Summary (Signed)
 Physician Discharge Summary   Patient: April [name] MRN: [id number] DOB: [birth year]  Admit date:     09/24/2024  Discharge date: 09/26/2024  Discharge Physician: Concepcion Riser   PCP: Billy Philippe JONELLE, NP   Recommendations at discharge:    Hospice at home. Radiation oncology follow up as scheduled for palliative radiation.  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 *  Hospital Course: April [name] is a 63 y.o. female with medical history significant for metastatic breast cancer with liver and lung mets, has not responded to multiple different chemotherapy regimens presented to ED with abdominal discomfort.  She says her abdomen feels very full and it hurts in her RUQ and into her back.  She also has a very severe cough which has been worsening over the last 2 weeks and is now really severe.  The cough makes her abdomen will hurt worse.  The patient is status post thoracentesis x 2 over the last couple weeks but that has not helped her cough. Admitted to [facility] service for further management evaluation.  Patient has metastatic breast cancer, palliative care team consulted discussed with her.  She understands poor prognosis, agreed for DNR and would like to go home with hospice.  She does have bleeding and oozing from the breast that is difficult to control, she is evaluated by radiation oncologist for palliative radiation therapy.  She got radiation therapy while in  hospital.  TOC arranged for DME, hospice services at home.  She is hemodynamically stable to be discharged home.   Assessment and Plan: Metastatic breast cancer- As per oncology her overall prognosis is poor, life expectancy less than 6 months. She is evaluated by radiation oncologist for bleeding and oozing breast lesions, palliative radiation started while in the hospital, scheduled as outpatient. I discussed with her regarding palliative care, she agreed to proceed with palliative care discussion. Palliative discussed with her, she understands poor prognosis, agreed with DNR, and home hospice discharge plan. TOC arranged for DME and services.   Goals of care discussion- DNR/DNI, home with hospice Continue wound care dressing changes for oozing skin lesions over right breast. Continue palliative radiation treatments as scheduled. Continue pain control, cough suppressants. She does have pain medications, no new meds given. She will be followed by hospice services at home.       Consultants: Palliative, radiation oncology. Procedures performed: none  Disposition: Hospice care Diet recommendation:  Discharge Diet Orders (From admission, onward)     Start     Ordered   09/26/24 0000  Diet - low sodium heart healthy        09/26/24 1715           Cardiac diet DISCHARGE MEDICATION: Allergies as of 09/26/2024       Reactions   [place] Deruxtecan Other (See Comments), Cough   Pt had hypersensitivity reaction which included, coughing and congestion at the end of infusion. See Progress note dated 04/15/2024.   Eribulin  Mesylate Palpitations   Patient  developed a flushing sensation and palpitations after her treatment had just completed.   Codeine  Nausea And Vomiting   Cymbalta  [duloxetine  Hcl] Other (See Comments)   Causes sores on arm   Hydrocodone  Nausea Only, Other (See Comments)   Dizziness, tpp   Ultram  [tramadol  Hcl] Nausea Only   Venlafaxine  Other (See Comments)    Causes sores on arm        Medication List     STOP taking these medications    Klor-Con  M10 10 MEQ tablet Generic drug: potassium chloride        TAKE these medications    buPROPion  150 MG 24 hr tablet Commonly known as: Wellbutrin  XL Take 1 tablet (150 mg total) by mouth daily.   cyclobenzaprine  5 MG tablet Commonly known as: FLEXERIL  Take 1 tablet (5 mg total) by mouth 3 (three) times daily as needed for muscle spasms.   loratadine  10 MG tablet Commonly known as: CLARITIN  Take 10 mg by mouth in the morning.   magic mouthwash (nystatin , lidocaine , diphenhydrAMINE , alum & mag hydroxide) suspension Take 5 mLs by mouth 4 (four) times daily as needed for mouth pain. Suspension contains equal amounts of Maalox Extra Strength, nystatin , diphenhydramine  and lidocaine .   morphine  CONCENTRATE 10 mg / 0.5 ml concentrated solution Take 0.5 mLs (10 mg total) by mouth every 2 (two) hours as needed. What changed: reasons to take this   naproxen sodium 220 MG tablet Commonly known as: ALEVE Take 440-660 mg by mouth 2 (two) times daily as needed (pain).   omeprazole  20 MG tablet Commonly known as: PRILOSEC OTC Take 20 mg by mouth daily before breakfast.   potassium chloride  10 MEQ tablet Commonly known as: KLOR-CON  Take 1 tablet (10 mEq total) by mouth 2 (two) times daily.   rizatriptan  5 MG tablet Commonly known as: MAXALT  Take 1 tablet (5 mg total) by mouth as needed for migraine. May repeat in 2 hours if needed   valACYclovir  1000 MG tablet Commonly known as: VALTREX  Take 1 tablet (1,000 mg total) by mouth 2 (two) times daily.   XGEVA  [place] Inject 120 mg into the skin every 3 (three) months.               Discharge Care Instructions  (From admission, onward)           Start     Ordered   09/26/24 0000  Discharge wound care:       Comments: Apply double-folded Xeroform gauze to right breast Q day, then cover with ABD pad and hold in place with mesh  underwear with the crotch cut out, to form a tube top. Avoid use of tape and use NS to remove dressing if it is adhered   09/26/24 1715            Discharge Exam: Filed Weights   09/25/24 1317  Weight: 85.4 kg      09/26/2024   10:46 AM 09/26/2024    6:21 AM 09/26/2024    2:39 AM  Vitals with BMI  Systolic 114 113 890  Diastolic 69 78 77  Pulse 108 107 101    General - Elderly ill looking Caucasian obese female, no acute distress HEENT - PERRLA, EOMI, atraumatic head, non tender sinuses. Lung - Clear, basal rales, rhonchi, no wheezes. Heart - S1, S2 heard, no murmurs, rubs, 1+ pedal edema, right upper extremity swelling. Abdomen - Soft, non tender, distended, bowel sounds good Neuro - Alert, awake and oriented x 3, non focal  exam. Skin - Warm and dry.  Condition at discharge: stable  The results of significant diagnostics from this hospitalization (including imaging, microbiology, ancillary and laboratory) are listed below for reference.   Imaging Studies: CT Angio Chest PE W and/or Wo Contrast Result Date: 09/24/2024 CLINICAL DATA:  Known metastatic breast cancer. Concern for pulmonary embolism. Abdominal pain. EXAM: CT ANGIOGRAPHY CHEST CT ABDOMEN AND PELVIS WITH CONTRAST TECHNIQUE: Multidetector CT imaging of the chest was performed using the standard protocol during bolus administration of intravenous contrast. Multiplanar CT image reconstructions and MIPs were obtained to evaluate the vascular anatomy. Multidetector CT imaging of the abdomen and pelvis was performed using the standard protocol during bolus administration of intravenous contrast. RADIATION DOSE REDUCTION: This exam was performed according to the departmental dose-optimization program which includes automated exposure control, adjustment of the mA and/or kV according to patient size and/or use of iterative reconstruction technique. CONTRAST:  OMNIPAQUE  IOHEXOL  350 MG/ML SOLN COMPARISON:  CT dated  09/19/2024. FINDINGS: CTA CHEST FINDINGS Cardiovascular: There is no cardiomegaly or pericardial effusion. Right-sided Port-A-Cath with tip in the right atrium. The thoracic aorta is unremarkable. The origins of the great vessels of the aortic arch appear patent. No pulmonary artery embolus identified. Mediastinum/Nodes: Mildly enlarged bilateral hilar soft tissues, likely adenopathy. The esophagus is grossly unremarkable. Lungs/Pleura: Moderate right and small left pleural effusion, increased in size since the prior CT. Partial compressive atelectasis of the adjacent lungs. There is diffuse airspace ground-glass density which may represent combination of edema and atelectasis or superimposed pneumonia. Rounded right middle lobe mass measures 3.4 x 2.8 cm. Additional ovoid consolidation at the right lung base similar to prior CT may represent rounded atelectasis. Overall interval increase in the diffuse subpleural and pulmonary nodularity which may represent edema, pneumonia, or progression of metastatic disease. No pneumothorax. The central airways are patent. Musculoskeletal: No acute osseous pathology. Degenerative changes spine. Similar sclerotic changes of T8 may be degenerative. Metastatic disease is not excluded. Progression of diffuse skin thickening and subcutaneous edema. Left axillary adenopathy measures up to 12 mm short axis. Right axillary surgical clips. Review of the MIP images confirms the above findings. CT ABDOMEN and PELVIS FINDINGS No intra-abdominal free air. Small ascites, slightly increased since the prior CT. Hepatobiliary: Multiple hepatic metastatic disease. The largest lesion in the right lobe measures 5.7 x 5.1 cm, similar or minimally increased in size when accounting for difference in measurement technique. No biliary dilatation. Calcified gallstone. Pancreas: Unremarkable. No pancreatic ductal dilatation or surrounding inflammatory changes. Spleen: Normal in size without focal  abnormality. Adrenals/Urinary Tract: The adrenal glands are unremarkable. There is no hydronephrosis on either side. There is symmetric enhancement and excretion of contrast by both kidneys. The urinary bladder is grossly unremarkable. Stomach/Bowel: There is sigmoid diverticulosis. Diffusely thickened colon likely related to ascites. Colitis is less likely but not excluded. No bowel obstruction. Appendectomy. Vascular/Lymphatic: The abdominal aorta and IVC unremarkable. No portal venous gas. No obvious adenopathy. Reproductive: The uterus is grossly unremarkable. No suspicious adnexal masses. Other: Diffuse subcutaneous edema progressed since the prior CT. Musculoskeletal: Osteopenia with degenerative changes. Similar appearance of L1 and L2 sclerotic changes. No acute osseous pathology. Review of the MIP images confirms the above findings. IMPRESSION: 1. No CT evidence of pulmonary artery embolus. 2. Moderate right and small left pleural effusion, increased in size since the prior CT. 3. Diffuse airspace ground-glass density may represent combination of edema and atelectasis or superimposed pneumonia. 4. Overall interval increase in the diffuse subpleural and  pulmonary nodularity which may represent edema, pneumonia, or progression of metastatic disease. 5. Multiple hepatic metastatic disease, similar or minimally increased in size since the prior CT. 6. Small ascites, slightly increased since the prior CT. 7. Diffusely thickened colon likely related to ascites. Colitis is less likely but not excluded. No bowel obstruction. 8. Cholelithiasis. 9. Sigmoid diverticulosis. Electronically Signed   By: Vanetta Chou M.D.   On: 09/24/2024 19:24   CT ABDOMEN PELVIS W CONTRAST Result Date: 09/24/2024 CLINICAL DATA:  Known metastatic breast cancer. Concern for pulmonary embolism. Abdominal pain. EXAM: CT ANGIOGRAPHY CHEST CT ABDOMEN AND PELVIS WITH CONTRAST TECHNIQUE: Multidetector CT imaging of the chest was  performed using the standard protocol during bolus administration of intravenous contrast. Multiplanar CT image reconstructions and MIPs were obtained to evaluate the vascular anatomy. Multidetector CT imaging of the abdomen and pelvis was performed using the standard protocol during bolus administration of intravenous contrast. RADIATION DOSE REDUCTION: This exam was performed according to the departmental dose-optimization program which includes automated exposure control, adjustment of the mA and/or kV according to patient size and/or use of iterative reconstruction technique. CONTRAST:  OMNIPAQUE  IOHEXOL  350 MG/ML SOLN COMPARISON:  CT dated 09/19/2024. FINDINGS: CTA CHEST FINDINGS Cardiovascular: There is no cardiomegaly or pericardial effusion. Right-sided Port-A-Cath with tip in the right atrium. The thoracic aorta is unremarkable. The origins of the great vessels of the aortic arch appear patent. No pulmonary artery embolus identified. Mediastinum/Nodes: Mildly enlarged bilateral hilar soft tissues, likely adenopathy. The esophagus is grossly unremarkable. Lungs/Pleura: Moderate right and small left pleural effusion, increased in size since the prior CT. Partial compressive atelectasis of the adjacent lungs. There is diffuse airspace ground-glass density which may represent combination of edema and atelectasis or superimposed pneumonia. Rounded right middle lobe mass measures 3.4 x 2.8 cm. Additional ovoid consolidation at the right lung base similar to prior CT may represent rounded atelectasis. Overall interval increase in the diffuse subpleural and pulmonary nodularity which may represent edema, pneumonia, or progression of metastatic disease. No pneumothorax. The central airways are patent. Musculoskeletal: No acute osseous pathology. Degenerative changes spine. Similar sclerotic changes of T8 may be degenerative. Metastatic disease is not excluded. Progression of diffuse skin thickening and  subcutaneous edema. Left axillary adenopathy measures up to 12 mm short axis. Right axillary surgical clips. Review of the MIP images confirms the above findings. CT ABDOMEN and PELVIS FINDINGS No intra-abdominal free air. Small ascites, slightly increased since the prior CT. Hepatobiliary: Multiple hepatic metastatic disease. The largest lesion in the right lobe measures 5.7 x 5.1 cm, similar or minimally increased in size when accounting for difference in measurement technique. No biliary dilatation. Calcified gallstone. Pancreas: Unremarkable. No pancreatic ductal dilatation or surrounding inflammatory changes. Spleen: Normal in size without focal abnormality. Adrenals/Urinary Tract: The adrenal glands are unremarkable. There is no hydronephrosis on either side. There is symmetric enhancement and excretion of contrast by both kidneys. The urinary bladder is grossly unremarkable. Stomach/Bowel: There is sigmoid diverticulosis. Diffusely thickened colon likely related to ascites. Colitis is less likely but not excluded. No bowel obstruction. Appendectomy. Vascular/Lymphatic: The abdominal aorta and IVC unremarkable. No portal venous gas. No obvious adenopathy. Reproductive: The uterus is grossly unremarkable. No suspicious adnexal masses. Other: Diffuse subcutaneous edema progressed since the prior CT. Musculoskeletal: Osteopenia with degenerative changes. Similar appearance of L1 and L2 sclerotic changes. No acute osseous pathology. Review of the MIP images confirms the above findings. IMPRESSION: 1. No CT evidence of pulmonary artery embolus. 2. Moderate  right and small left pleural effusion, increased in size since the prior CT. 3. Diffuse airspace ground-glass density may represent combination of edema and atelectasis or superimposed pneumonia. 4. Overall interval increase in the diffuse subpleural and pulmonary nodularity which may represent edema, pneumonia, or progression of metastatic disease. 5. Multiple  hepatic metastatic disease, similar or minimally increased in size since the prior CT. 6. Small ascites, slightly increased since the prior CT. 7. Diffusely thickened colon likely related to ascites. Colitis is less likely but not excluded. No bowel obstruction. 8. Cholelithiasis. 9. Sigmoid diverticulosis. Electronically Signed   By: Vanetta Chou M.D.   On: 09/24/2024 19:24   DG Chest 2 View Result Date: 09/24/2024 CLINICAL DATA:  Cough. EXAM: CHEST - 2 VIEW COMPARISON:  Chest radiograph dated 09/18/2024. FINDINGS: Right-sided Port-A-Cath in similar position. Small right pleural effusion with associated right lung base atelectasis or infiltrate, increased since the prior radiograph. Right lower lung field rounded mass measures 3.5 cm. No pneumothorax. The cardiac silhouette is within normal limits. No acute osseous pathology. IMPRESSION: 1. Small right pleural effusion with associated right lung base atelectasis or infiltrate, increased since the prior radiograph. 2. Right lower lung field rounded mass. Electronically Signed   By: Vanetta Chou M.D.   On: 09/24/2024 17:34   CT CHEST ABDOMEN PELVIS W CONTRAST Result Date: 09/19/2024 CLINICAL DATA:  Right-sided breast cancer. Restaging. Metastatic disease evaluation. EXAM: CT CHEST, ABDOMEN, AND PELVIS WITH CONTRAST TECHNIQUE: Multidetector CT imaging of the chest, abdomen and pelvis was performed following the standard protocol during bolus administration of intravenous contrast. RADIATION DOSE REDUCTION: This exam was performed according to the departmental dose-optimization program which includes automated exposure control, adjustment of the mA and/or kV according to patient size and/or use of iterative reconstruction technique. CONTRAST:  OMNIPAQUE  IOHEXOL  300 MG/ML  SOLN COMPARISON:  07/15/2024 FINDINGS: CT CHEST FINDINGS Cardiovascular: The heart size is normal. No substantial pericardial effusion. Right Port-A-Cath remains in place.  Mediastinum/Nodes: Slight increase in 10 mm short axis prevascular lymph node. There is no hilar lymphadenopathy. The esophagus has normal imaging features. Mild left axillary lymphadenopathy similar to prior. Lungs/Pleura: Anterior right lung nodule measuring 4.0 x 3.1 cm on image 70/3 today was 2.0 x 1.8 cm previously, remeasured. 11 mm lingular nodule on 78/3 is new in the interval. 15 mm paraspinal right lower lobe nodule on 91/3 was 11 mm previously, remeasured. Similar right pleural effusion with underlying pleural thickening. Tiny left pleural effusion is new in the interval. Musculoskeletal: No worrisome lytic or sclerotic osseous abnormality. Interval progression of skin thickening in the left breast with progressive body wall edema in the thorax diffusely. CT ABDOMEN PELVIS FINDINGS Hepatobiliary: Dominant metastasis in the medial segment left liver measures 5.4 x 5.0 cm today increased from 4.0 x 3.8 cm when remeasured in a similar fashion on the previous study. Progressive cluster of liver metastases towards the hepatic dome. Adjacent metastatic lesions in the lateral segment left liver seen on image 54/2 measure up to 13 mm and are new in the interval. Multiple calcified gallstones evident. No intrahepatic or extrahepatic biliary dilation. Pancreas: No focal mass lesion. No dilatation of the main duct. No intraparenchymal cyst. No peripancreatic edema. Spleen: No splenomegaly. No suspicious focal mass lesion. Adrenals/Urinary Tract: No adrenal nodule or mass. Kidneys unremarkable. No evidence for hydroureter. Bladder is decompressed. Stomach/Bowel: Stomach is unremarkable. No gastric wall thickening. No evidence of outlet obstruction. Duodenum is normally positioned as is the ligament of Treitz. No small bowel  wall thickening. No small bowel dilatation. The terminal ileum is normal. Colon is diffusely decompressed which accentuates wall thickness. Diverticular changes are noted in the left colon without  evidence of diverticulitis. Vascular/Lymphatic: No abdominal aortic aneurysm. No abdominal lymphadenopathy. No pelvic sidewall lymphadenopathy. Reproductive: Gas is identified in the vagina and in the region of the external cervical os. Nonspecific. There is no adnexal mass. Other: Interval progression of free fluid around the liver and spleen. Small to moderate volume free fluid in the pelvis has also progressed in the interval with fluid seen in both para colic gutters. Musculoskeletal: Mixed lytic and sclerotic lesion in the L2 vertebral body is similar to prior. Stable metastatic involvement of L1 and T8 as described previously. Sclerosis in the right ischial tuberosity is stable consistent with metastatic involvement. There is diffuse body wall edema. IMPRESSION: 1. Interval progression of pulmonary and hepatic metastases. 2. Similar mild left axillary lymphadenopathy. 3. Similar osseous metastatic disease. 4. Interval progression of free fluid in the abdomen and pelvis. 5. Interval progression of skin thickening in the left breast with progressive body wall edema in the thorax and abdomen. 6. Cholelithiasis. 7. Gas in the vagina and in the region of the external cervical os. Nonspecific. 8. Colonic diverticulosis without diverticulitis. Electronically Signed   By: Camellia Candle M.D.   On: 09/19/2024 09:00   IR ABDOMEN US  LIMITED Result Date: 09/18/2024 CLINICAL DATA:  Previous right-sided thoracentesis, but persistent shortness of breath. EXAM: LIMITED ABDOMEN ULTRASOUND FOR ASCITES TECHNIQUE: Limited ultrasound survey for ascites was performed in all four abdominal quadrants. COMPARISON:  None Available. FINDINGS: All 4 quadrants of the abdomen were evaluated with ultrasound. No significant fluid collection. IMPRESSION: No significant ascites. Electronically Signed   By: Cordella Banner   On: 09/18/2024 14:56   IR THORACENTESIS ASP PLEURAL SPACE W/IMG GUIDE Result Date: 09/18/2024 INDICATION:  Right-sided pleural effusion in a patient with shortness of breath and nonproductive cough EXAM: ULTRASOUND GUIDED right THORACENTESIS MEDICATIONS: None. COMPLICATIONS: None immediate. PROCEDURE: An ultrasound guided thoracentesis was thoroughly discussed with the patient and questions answered. The benefits, risks, alternatives and complications were also discussed. The patient understands and wishes to proceed with the procedure. Written consent was obtained. Ultrasound was performed to localize and mark an adequate pocket of fluid in the right chest. The area was then prepped and draped in the normal sterile fashion. 1% Lidocaine  was used for local anesthesia. Under ultrasound guidance a 19 gauge 7 cm Yueh catheter was introduced. Thoracentesis was performed. The catheter was removed and a dressing applied. FINDINGS: A total of approximately 800 mL of dark yellow fluid was removed. Samples were sent to the laboratory as requested by the clinical team. IMPRESSION: Successful ultrasound guided right thoracentesis yielding 800 mL of pleural fluid. Electronically Signed   By: Cordella Banner   On: 09/18/2024 14:55   DG Chest Port 1 View Result Date: 09/18/2024 CLINICAL DATA:  Status post right thoracentesis. EXAM: PORTABLE CHEST 1 VIEW COMPARISON:  09/15/2024. Chest, abdomen and pelvis CT dated 07/15/2024. FINDINGS: Normal-sized heart. Stable right jugular porta catheter with its tip in the right atrium, proximally 2.3 cm inferior to the superior cavoatrial junction. Grossly stable right mid to lower lung zone mass. No pneumothorax following right thoracentesis. Small amount of residual pleural fluid on the right. Right breast calcified fat necrosis. Unremarkable bones. IMPRESSION: 1. No pneumothorax following right thoracentesis. 2. Small amount of residual right pleural fluid. 3. Grossly stable right mid to lower lung zone mass. Electronically Signed  By: Elspeth Bathe M.D.   On: 09/18/2024 14:43   US   THORACENTESIS ASP PLEURAL SPACE W/IMG GUIDE Result Date: 09/15/2024 INDICATION: History of breast cancer, found to have right pleural effusion on outpatient chest x-ray. Consult for diagnostic and therapeutic right thoracentesis. EXAM: ULTRASOUND GUIDED DIAGNOSTIC AND THERAPEUTIC RIGHT THORACENTESIS MEDICATIONS: 8 mL 1% lidocaine  COMPLICATIONS: None immediate. PROCEDURE: An ultrasound guided thoracentesis was thoroughly discussed with the patient and questions answered. The benefits, risks, alternatives and complications were also discussed. The patient understands and wishes to proceed with the procedure. Written consent was obtained. Ultrasound was performed to localize and mark an adequate pocket of fluid in the right chest. The area was then prepped and draped in the normal sterile fashion. 1% Lidocaine  was used for local anesthesia. Under ultrasound guidance a 6 Fr Safe-T-Centesis catheter was introduced. Thoracentesis was performed. The catheter was removed and a dressing applied. FINDINGS: A total of approximately 850 mL of hazy yellow fluid was removed. Samples were sent to the laboratory as requested by the clinical team. IMPRESSION: Successful ultrasound guided right thoracentesis yielding 850 mL of pleural fluid. Performed and dictated by Kimble Clas, PA-C Electronically Signed   By: Marcey Moan M.D.   On: 09/15/2024 16:50   DG Chest Port 1 View Result Date: 09/15/2024 CLINICAL DATA:  Right thoracentesis. EXAM: PORTABLE CHEST 1 VIEW COMPARISON:  09/15/2024 and CT chest 07/15/2024. FINDINGS: Trachea is midline. Right IJ power port tip is in the right atrium. Heart size normal. Small to moderate residual loculated right pleural effusion with rounded consolidation in the lateral right midlung zone. No pneumothorax. Left lung is grossly clear. No pleural fluid. Surgical clips in the right axilla with a right mastectomy. IMPRESSION: 1. No pneumothorax status post right thoracentesis. Small to  moderate residual loculated right pleural effusion. 2. Rounded consolidation in the lateral right midlung zone, better evaluated on CT chest 07/15/2024. Electronically Signed   By: Newell Eke M.D.   On: 09/15/2024 15:56   DG Chest 2 View Result Date: 09/15/2024 CLINICAL DATA:  Cough and shortness of breath. EXAM: CHEST - 2 VIEW COMPARISON:  Chest x-ray 08/29/2023.  Chest CT 07/15/2024. FINDINGS: Interval development of consolidative opacity in the right mid and lower lung with small right pleural effusion. Similar changes are seen on CT chest 07/15/2024. round calcification overlying the right lower lung seen to be in the right breast on previous CT. Interstitial prominence noted left lung without dense focal airspace consolidation. Subtle irregular nodular opacity noted left apex similar to previous CT. The cardiopericardial silhouette is within normal limits for size. Right Port-A-Cath again noted. IMPRESSION: 1. Consolidative opacity in the right mid and lower lung with small right pleural effusion. Similar changes are seen on CT chest 07/15/2024. 2. Subtle irregular nodular opacity left apex. Irregular left upper lobe nodules are seen on previous CT. Electronically Signed   By: Camellia Candle M.D.   On: 09/15/2024 10:43    Microbiology: Results for orders placed or performed during the hospital encounter of 09/24/24  Blood culture (routine x 2)     Status: None (Preliminary result)   Collection Time: 09/24/24  5:13 PM   Specimen: BLOOD  Result Value Ref Range Status   Specimen Description   Final    BLOOD LEFT ANTECUBITAL Performed at [facility], [address], [place], [place] [address]    Special Requests   Final    BOTTLES DRAWN AEROBIC AND ANAEROBIC Blood Culture adequate volume Performed at [facility], [address] [place], [place] [address]    Culture   Final    NO GROWTH 2 DAYS Performed at [facility], [address],  [place], [place] [address]    Report Status PENDING  Incomplete  Urine Culture     Status: Abnormal   Collection Time: 09/24/24  6:11 PM   Specimen: Urine, Random  Result Value Ref Range Status   Specimen Description   Final    URINE, RANDOM Performed at [facility], [address], [place], [place] [address]    Special Requests   Final    NONE Reflexed from [id number] Performed at [facility], [address] [place], [place] [address]    Culture MULTIPLE SPECIES PRESENT, SUGGEST RECOLLECTION (A)  Final   Report Status 09/25/2024 FINAL  Final  Blood culture (routine x 2)     Status: None (Preliminary result)   Collection Time: 09/25/24 12:55 PM   Specimen: BLOOD  Result Value Ref Range Status   Specimen Description   Final    BLOOD BLOOD LEFT HAND Performed at [facility], [address] [place], [place] [address]    Special Requests   Final    BOTTLES DRAWN AEROBIC ONLY Blood Culture adequate volume Performed at [facility], [address] [place], [place] [address]    Culture   Final    NO GROWTH < 24 HOURS Performed at [facility], [address] [place], [place] [address]    Report Status PENDING  Incomplete   *Note: Due to a large number of results and/or encounters for the requested time period, some results have not been displayed. A complete set of results can be found in Results Review.    Labs: CBC: Recent Labs  Lab 09/23/24 1025 09/24/24 1713 09/25/24 0530 09/26/24 0445  WBC 3.8* 4.4 4.0 3.7*  NEUTROABS 2.6 2.8  --   --   HGB 12.6 13.4 11.0* 11.1*  HCT 35.4* 38.8 32.4* 33.5*  MCV 105.4* 109.6* 109.1* 111.7*  PLT 122* 161 123* 125*   Basic Metabolic Panel: Recent Labs  Lab 09/23/24 1025 09/24/24 1713 09/25/24 0530 09/26/24 0445  NA 137 136 137 135  K 3.3* 3.3* 4.2 4.0  CL 105 102 106 104  CO2 23 22 21* 24  GLUCOSE 78 89 106* 92  BUN 8 9 7* 7*  CREATININE 0.33*  0.46 0.44 0.51  CALCIUM 8.5* 8.8* 8.0* 8.5*  PHOS  --   --   --  3.9   Liver Function Tests: Recent Labs  Lab 09/23/24 1025 09/24/24 1713 09/25/24 0530 09/26/24 0445  AST 95* 133* 108*  --   ALT 40 53* 41  --   ALKPHOS 423* 571* 443*  --   BILITOT 3.4* 3.4* 2.7*  --   PROT 5.7* 6.2* 5.2*  --   ALBUMIN  2.6* 2.9* 2.4* 2.5*   CBG: No results for input(s): GLUCAP in the last 168 hours.  Discharge time spent: 37 minutes.  Signed: Concepcion Riser, MD [facility] 09/26/2024

## 2024-09-26 NOTE — Progress Notes (Signed)
   09/26/24 1045  Spiritual Encounters  Type of Visit Initial  Care provided to: Pt and family  Reason for visit Advance directives   I responded to a page from [place], Licensed conveyancer, to assist Ms Souders now that her HCPOA documents were complete.  I greeted Ms Crisafulli and daughter, Lacinda. I checked on their well being and assessed other care needs they may have and encouraged their self care as they navigate care for [place] and as [place] balances needs at home.  I facilitated completion of HCPOA with [facility] Dawn, RN and witnesses. I scanned document to ACP and placed copy in shadow chart. I returned with original and two copies for family to secure.  Please page chaplain for any ongoing support needs. Mohamed Portlock L. Delores HERO.Div

## 2024-09-26 NOTE — Plan of Care (Signed)
  Problem: Pain Managment: Goal: General experience of comfort will improve and/or be controlled Outcome: Progressing   Problem: Safety: Goal: Ability to remain free from injury will improve Outcome: Progressing   Problem: Skin Integrity: Goal: Risk for impaired skin integrity will decrease Outcome: Progressing

## 2024-09-26 NOTE — Plan of Care (Signed)
  Problem: Education: Goal: Knowledge of General Education information will improve Description: Including pain rating scale, medication(s)/side effects and non-pharmacologic comfort measures Outcome: Completed/Met   Problem: Health Behavior/Discharge Planning: Goal: Ability to manage health-related needs will improve Outcome: Completed/Met   Problem: Clinical Measurements: Goal: Ability to maintain clinical measurements within normal limits will improve Outcome: Completed/Met Goal: Will remain free from infection Outcome: Completed/Met Goal: Diagnostic test results will improve Outcome: Completed/Met Goal: Respiratory complications will improve Outcome: Completed/Met Goal: Cardiovascular complication will be avoided Outcome: Completed/Met   Problem: Activity: Goal: Risk for activity intolerance will decrease Outcome: Completed/Met   Problem: Nutrition: Goal: Adequate nutrition will be maintained Outcome: Completed/Met   Problem: Coping: Goal: Level of anxiety will decrease Outcome: Completed/Met   Problem: Elimination: Goal: Will not experience complications related to bowel motility Outcome: Completed/Met Goal: Will not experience complications related to urinary retention Outcome: Completed/Met   Problem: Pain Managment: Goal: General experience of comfort will improve and/or be controlled 09/26/2024 1809 by Jerrye Glendell RAMAN, RN Outcome: Completed/Met 09/26/2024 1446 by Jerrye Glendell RAMAN, RN Outcome: Progressing   Problem: Safety: Goal: Ability to remain free from injury will improve 09/26/2024 1809 by Jerrye Glendell RAMAN, RN Outcome: Completed/Met 09/26/2024 1446 by Jerrye Glendell RAMAN, RN Outcome: Progressing   Problem: Skin Integrity: Goal: Risk for impaired skin integrity will decrease 09/26/2024 1809 by Jerrye Glendell RAMAN, RN Outcome: Completed/Met 09/26/2024 1446 by Jerrye Glendell RAMAN, RN Outcome: Progressing

## 2024-09-29 ENCOUNTER — Ambulatory Visit
Admission: RE | Admit: 2024-09-29 | Discharge: 2024-09-29 | Disposition: A | Discharge status: Home / Self Care | Source: Ambulatory Visit | Attending: Radiation Oncology | Admitting: Radiation Oncology

## 2024-09-29 ENCOUNTER — Other Ambulatory Visit: Payer: Self-pay

## 2024-09-29 ENCOUNTER — Ambulatory Visit: Admission: RE | Admit: 2024-09-29 | Source: Ambulatory Visit | Admitting: Radiation Oncology

## 2024-09-29 DIAGNOSIS — Z510 Encounter for antineoplastic radiation therapy: Secondary | ICD-10-CM | POA: Diagnosis present

## 2024-09-29 DIAGNOSIS — Z170 Estrogen receptor positive status [ER+]: Secondary | ICD-10-CM | POA: Diagnosis not present

## 2024-09-29 DIAGNOSIS — C50411 Malignant neoplasm of upper-outer quadrant of right female breast: Secondary | ICD-10-CM | POA: Diagnosis not present

## 2024-09-29 LAB — RAD ONC ARIA SESSION SUMMARY
Course Elapsed Days: 0
Plan Fractions Treated to Date: 1
Plan Prescribed Dose Per Fraction: 8 Gy
Plan Total Fractions Prescribed: 1
Plan Total Prescribed Dose: 8 Gy
Reference Point Dosage Given to Date: 8 Gy
Reference Point Session Dosage Given: 8 Gy
Session Number: 1

## 2024-09-29 LAB — CULTURE, BLOOD (ROUTINE X 2)
Culture: NO GROWTH
Special Requests: ADEQUATE

## 2024-09-30 ENCOUNTER — Other Ambulatory Visit: Payer: Self-pay | Admitting: Hematology and Oncology

## 2024-09-30 ENCOUNTER — Ambulatory Visit: Admitting: Radiation Oncology

## 2024-09-30 LAB — CULTURE, BLOOD (ROUTINE X 2)
Culture: NO GROWTH
Special Requests: ADEQUATE

## 2024-09-30 NOTE — Radiation Completion Notes (Signed)
 Patient Name: [name], April MRN: [id number] Date of Birth: Apr 30, 1961 Referring Physician: JOANNA WILLIAMSON, M.D. Date of Service: 2024-09-30 Radiation Oncologist: Lauraine Golden, M.D. 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 Intent: Palliative     ==========DELIVERED PLANS==========  First Treatment Date: 2024-09-29 Last Treatment Date: 2024-09-29   Plan Name: Breast_R Site: Breast, Right Technique: 3D Mode: Photon Dose Per Fraction: 8 Gy Prescribed Dose (Delivered / Prescribed): 8 Gy / 8 Gy Prescribed Fxs (Delivered / Prescribed): 1 / 1     ==========ON TREATMENT VISIT DATES========== 2024-09-29     ==========UPCOMING VISITS========== 11/13/2024 [facility] EST BREAST Bensimhon, Toribio SAUNDERS, MD  11/13/2024 [facility]ECHO LAB ECHOCARDIOGRAM [facility] ECHO OP 1  11/07/2024 [facility] OFFICE VISIT Billy Philippe SAUNDERS, NP  10/14/2024 [facility] ONCOLOGY NUT 45 Ivonne Harlene RAMAN, [place]  10/13/2024 [facility] OFFICE VISIT Dallie Vera GAILS, MD        ==========APPENDIX - ON TREATMENT VISIT NOTES==========   See weekly On Treatment Notes in Epic for details in the Media tab (listed as Progress notes on the On Treatment Visit Dates listed above).

## 2024-10-06 ENCOUNTER — Encounter: Payer: Self-pay | Admitting: Hematology and Oncology

## 2024-10-07 ENCOUNTER — Encounter: Payer: Self-pay | Admitting: Hematology and Oncology

## 2024-10-13 ENCOUNTER — Ambulatory Visit: Admitting: Obstetrics and Gynecology

## 2024-10-14 ENCOUNTER — Inpatient Hospital Stay: Admitting: Dietician

## 2024-10-14 ENCOUNTER — Inpatient Hospital Stay: Admitting: Hematology and Oncology

## 2024-10-14 ENCOUNTER — Inpatient Hospital Stay

## 2024-11-03 DEATH — deceased

## 2024-11-07 ENCOUNTER — Ambulatory Visit: Admitting: Family Medicine

## 2024-11-13 ENCOUNTER — Encounter (HOSPITAL_COMMUNITY): Admitting: Internal Medicine

## 2024-11-13 ENCOUNTER — Other Ambulatory Visit (HOSPITAL_COMMUNITY)

## 2025-06-18 ENCOUNTER — Ambulatory Visit: Admitting: Obstetrics and Gynecology
